# Patient Record
Sex: Female | Born: 1945
Health system: Southern US, Community
[De-identification: ages and names within clinical notes are randomized; demographics above are authoritative.]

## PROBLEM LIST (undated history)

## (undated) DIAGNOSIS — N183 Chronic kidney disease, stage 3 unspecified: Secondary | ICD-10-CM

## (undated) DIAGNOSIS — G459 Transient cerebral ischemic attack, unspecified: Secondary | ICD-10-CM

## (undated) DIAGNOSIS — I2510 Atherosclerotic heart disease of native coronary artery without angina pectoris: Secondary | ICD-10-CM

## (undated) DIAGNOSIS — R42 Dizziness and giddiness: Secondary | ICD-10-CM

## (undated) DIAGNOSIS — G43909 Migraine, unspecified, not intractable, without status migrainosus: Secondary | ICD-10-CM

## (undated) DIAGNOSIS — E119 Type 2 diabetes mellitus without complications: Secondary | ICD-10-CM

## (undated) DIAGNOSIS — D573 Sickle-cell trait: Secondary | ICD-10-CM

## (undated) DIAGNOSIS — I5030 Unspecified diastolic (congestive) heart failure: Secondary | ICD-10-CM

## (undated) DIAGNOSIS — I10 Essential (primary) hypertension: Secondary | ICD-10-CM

## (undated) DIAGNOSIS — M1990 Unspecified osteoarthritis, unspecified site: Secondary | ICD-10-CM

## (undated) HISTORY — PX: JOINT REPLACEMENT: SHX530

## (undated) HISTORY — PX: CARDIAC CATHETERIZATION: SHX172

## (undated) HISTORY — PX: CATARACT EXTRACTION W/ INTRAOCULAR LENS  IMPLANT, BILATERAL: SHX1307

---

## 1970-06-26 HISTORY — PX: TONSILLECTOMY: SUR1361

## 1976-06-26 DIAGNOSIS — G459 Transient cerebral ischemic attack, unspecified: Secondary | ICD-10-CM

## 1976-06-26 HISTORY — DX: Transient cerebral ischemic attack, unspecified: G45.9

## 2000-12-28 ENCOUNTER — Encounter: Payer: Self-pay | Admitting: Emergency Medicine

## 2000-12-28 ENCOUNTER — Inpatient Hospital Stay (HOSPITAL_COMMUNITY): Admission: EM | Admit: 2000-12-28 | Discharge: 2000-12-29 | Payer: Self-pay | Admitting: Emergency Medicine

## 2000-12-31 ENCOUNTER — Emergency Department (HOSPITAL_COMMUNITY): Admission: EM | Admit: 2000-12-31 | Discharge: 2001-01-01 | Payer: Self-pay | Admitting: *Deleted

## 2001-01-01 ENCOUNTER — Encounter: Payer: Self-pay | Admitting: *Deleted

## 2001-01-02 ENCOUNTER — Encounter: Payer: Self-pay | Admitting: *Deleted

## 2001-01-02 ENCOUNTER — Emergency Department (HOSPITAL_COMMUNITY): Admission: EM | Admit: 2001-01-02 | Discharge: 2001-01-03 | Payer: Self-pay | Admitting: Podiatry

## 2001-01-03 ENCOUNTER — Encounter: Payer: Self-pay | Admitting: *Deleted

## 2001-01-04 ENCOUNTER — Other Ambulatory Visit: Admission: RE | Admit: 2001-01-04 | Discharge: 2001-01-04 | Payer: Self-pay | Admitting: Obstetrics and Gynecology

## 2002-04-04 ENCOUNTER — Encounter: Payer: Self-pay | Admitting: Internal Medicine

## 2002-04-04 ENCOUNTER — Emergency Department (HOSPITAL_COMMUNITY): Admission: EM | Admit: 2002-04-04 | Discharge: 2002-04-05 | Payer: Self-pay | Admitting: Internal Medicine

## 2003-04-10 ENCOUNTER — Inpatient Hospital Stay (HOSPITAL_COMMUNITY): Admission: EM | Admit: 2003-04-10 | Discharge: 2003-04-14 | Payer: Self-pay | Admitting: Emergency Medicine

## 2003-10-24 ENCOUNTER — Emergency Department (HOSPITAL_COMMUNITY): Admission: EM | Admit: 2003-10-24 | Discharge: 2003-10-24 | Payer: Self-pay | Admitting: Emergency Medicine

## 2004-11-12 ENCOUNTER — Ambulatory Visit (HOSPITAL_COMMUNITY): Admission: RE | Admit: 2004-11-12 | Discharge: 2004-11-12 | Payer: Self-pay | Admitting: Family Medicine

## 2005-06-29 ENCOUNTER — Ambulatory Visit (HOSPITAL_COMMUNITY): Admission: RE | Admit: 2005-06-29 | Discharge: 2005-06-29 | Payer: Self-pay | Admitting: Internal Medicine

## 2005-08-05 ENCOUNTER — Emergency Department (HOSPITAL_COMMUNITY): Admission: EM | Admit: 2005-08-05 | Discharge: 2005-08-05 | Payer: Self-pay | Admitting: Emergency Medicine

## 2005-08-14 ENCOUNTER — Ambulatory Visit (HOSPITAL_COMMUNITY): Admission: RE | Admit: 2005-08-14 | Discharge: 2005-08-14 | Payer: Self-pay | Admitting: Nephrology

## 2006-03-02 ENCOUNTER — Ambulatory Visit (HOSPITAL_COMMUNITY): Admission: RE | Admit: 2006-03-02 | Discharge: 2006-03-02 | Payer: Self-pay | Admitting: Family Medicine

## 2006-05-03 ENCOUNTER — Inpatient Hospital Stay (HOSPITAL_COMMUNITY): Admission: RE | Admit: 2006-05-03 | Discharge: 2006-05-10 | Payer: Self-pay | Admitting: Orthopedic Surgery

## 2006-05-03 HISTORY — PX: REPLACEMENT TOTAL KNEE: SUR1224

## 2006-06-05 ENCOUNTER — Encounter (HOSPITAL_COMMUNITY): Admission: RE | Admit: 2006-06-05 | Discharge: 2006-06-25 | Payer: Self-pay | Admitting: Orthopedic Surgery

## 2006-06-27 ENCOUNTER — Encounter (HOSPITAL_COMMUNITY): Admission: RE | Admit: 2006-06-27 | Discharge: 2006-07-27 | Payer: Self-pay | Admitting: Orthopedic Surgery

## 2006-07-30 ENCOUNTER — Encounter (HOSPITAL_COMMUNITY): Admission: RE | Admit: 2006-07-30 | Discharge: 2006-08-29 | Payer: Self-pay | Admitting: Orthopedic Surgery

## 2007-04-04 ENCOUNTER — Ambulatory Visit (HOSPITAL_COMMUNITY): Admission: RE | Admit: 2007-04-04 | Discharge: 2007-04-04 | Payer: Self-pay | Admitting: Surgery

## 2007-10-13 ENCOUNTER — Emergency Department (HOSPITAL_COMMUNITY): Admission: EM | Admit: 2007-10-13 | Discharge: 2007-10-13 | Payer: Self-pay | Admitting: Emergency Medicine

## 2007-10-16 ENCOUNTER — Ambulatory Visit (HOSPITAL_COMMUNITY): Admission: RE | Admit: 2007-10-16 | Discharge: 2007-10-16 | Payer: Self-pay | Admitting: Family Medicine

## 2007-11-04 ENCOUNTER — Ambulatory Visit: Payer: Self-pay | Admitting: Gastroenterology

## 2007-11-08 ENCOUNTER — Ambulatory Visit: Payer: Self-pay | Admitting: Gastroenterology

## 2007-11-08 ENCOUNTER — Ambulatory Visit (HOSPITAL_COMMUNITY): Admission: RE | Admit: 2007-11-08 | Discharge: 2007-11-08 | Payer: Self-pay | Admitting: Gastroenterology

## 2007-11-08 HISTORY — PX: ESOPHAGOGASTRODUODENOSCOPY: SHX1529

## 2007-11-13 ENCOUNTER — Encounter (HOSPITAL_COMMUNITY): Admission: RE | Admit: 2007-11-13 | Discharge: 2007-12-13 | Payer: Self-pay | Admitting: Gastroenterology

## 2007-11-13 HISTORY — PX: GASTRIC MOTILITY STUDY: SHX964

## 2008-01-07 ENCOUNTER — Ambulatory Visit: Payer: Self-pay | Admitting: Gastroenterology

## 2008-04-09 ENCOUNTER — Ambulatory Visit (HOSPITAL_COMMUNITY): Admission: RE | Admit: 2008-04-09 | Discharge: 2008-04-09 | Payer: Self-pay | Admitting: Family Medicine

## 2008-10-02 ENCOUNTER — Emergency Department (HOSPITAL_COMMUNITY): Admission: EM | Admit: 2008-10-02 | Discharge: 2008-10-02 | Payer: Self-pay | Admitting: Emergency Medicine

## 2008-10-05 ENCOUNTER — Ambulatory Visit (HOSPITAL_COMMUNITY): Admission: RE | Admit: 2008-10-05 | Discharge: 2008-10-05 | Payer: Self-pay | Admitting: Family Medicine

## 2008-12-29 ENCOUNTER — Encounter: Payer: Self-pay | Admitting: Gastroenterology

## 2009-01-20 ENCOUNTER — Ambulatory Visit (HOSPITAL_COMMUNITY): Admission: RE | Admit: 2009-01-20 | Discharge: 2009-01-20 | Payer: Self-pay | Admitting: Family Medicine

## 2009-07-27 ENCOUNTER — Inpatient Hospital Stay (HOSPITAL_COMMUNITY): Admission: EM | Admit: 2009-07-27 | Discharge: 2009-08-01 | Payer: Self-pay | Admitting: Emergency Medicine

## 2010-01-27 ENCOUNTER — Encounter: Payer: Self-pay | Admitting: Gastroenterology

## 2010-02-11 ENCOUNTER — Encounter (INDEPENDENT_AMBULATORY_CARE_PROVIDER_SITE_OTHER): Payer: Self-pay

## 2010-02-11 DIAGNOSIS — IMO0001 Reserved for inherently not codable concepts without codable children: Secondary | ICD-10-CM | POA: Insufficient documentation

## 2010-02-11 DIAGNOSIS — I10 Essential (primary) hypertension: Secondary | ICD-10-CM | POA: Insufficient documentation

## 2010-02-11 DIAGNOSIS — Z794 Long term (current) use of insulin: Secondary | ICD-10-CM

## 2010-02-11 DIAGNOSIS — E1140 Type 2 diabetes mellitus with diabetic neuropathy, unspecified: Secondary | ICD-10-CM | POA: Insufficient documentation

## 2010-02-11 DIAGNOSIS — R51 Headache: Secondary | ICD-10-CM | POA: Insufficient documentation

## 2010-02-15 ENCOUNTER — Telehealth (INDEPENDENT_AMBULATORY_CARE_PROVIDER_SITE_OTHER): Payer: Self-pay

## 2010-02-15 ENCOUNTER — Ambulatory Visit: Payer: Self-pay | Admitting: Internal Medicine

## 2010-02-15 DIAGNOSIS — R109 Unspecified abdominal pain: Secondary | ICD-10-CM | POA: Insufficient documentation

## 2010-02-15 DIAGNOSIS — E0590 Thyrotoxicosis, unspecified without thyrotoxic crisis or storm: Secondary | ICD-10-CM | POA: Insufficient documentation

## 2010-02-15 DIAGNOSIS — R1310 Dysphagia, unspecified: Secondary | ICD-10-CM | POA: Insufficient documentation

## 2010-02-15 DIAGNOSIS — K219 Gastro-esophageal reflux disease without esophagitis: Secondary | ICD-10-CM | POA: Insufficient documentation

## 2010-02-25 ENCOUNTER — Encounter: Payer: Self-pay | Admitting: Gastroenterology

## 2010-03-04 ENCOUNTER — Encounter: Payer: Self-pay | Admitting: Gastroenterology

## 2010-03-15 LAB — CONVERTED CEMR LAB
ALT: 13 units/L (ref 0–35)
Albumin: 3.9 g/dL (ref 3.5–5.2)
Amylase: 43 units/L (ref 0–105)
Basophils Absolute: 0 10*3/uL (ref 0.0–0.1)
Basophils Relative: 1 % (ref 0–1)
Calcium: 10.1 mg/dL (ref 8.4–10.5)
Chloride: 105 meq/L (ref 96–112)
Creatinine, Ser: 1.43 mg/dL — ABNORMAL HIGH (ref 0.40–1.20)
Eosinophils Relative: 6 % — ABNORMAL HIGH (ref 0–5)
Free T4: 1.1 ng/dL (ref 0.80–1.80)
HCT: 38 % (ref 36.0–46.0)
Lipase: <10 units/L (ref 0–75)
MCHC: 36.1 g/dL — ABNORMAL HIGH (ref 30.0–36.0)
Monocytes Absolute: 0.4 10*3/uL (ref 0.1–1.0)
Neutrophils Relative %: 47 % (ref 43–77)
Platelets: 179 10*3/uL (ref 150–400)
RBC: 4.88 M/uL (ref 3.87–5.11)
Sodium: 140 meq/L (ref 135–145)
TSH: 1.497 microintl units/mL (ref 0.350–4.500)
Total Bilirubin: 0.3 mg/dL (ref 0.3–1.2)
Total Protein: 6.7 g/dL (ref 6.0–8.3)
WBC: 4.9 10*3/uL (ref 4.0–10.5)

## 2010-04-13 ENCOUNTER — Encounter (INDEPENDENT_AMBULATORY_CARE_PROVIDER_SITE_OTHER): Payer: Self-pay

## 2010-04-14 ENCOUNTER — Encounter (INDEPENDENT_AMBULATORY_CARE_PROVIDER_SITE_OTHER): Payer: Self-pay | Admitting: *Deleted

## 2010-07-08 ENCOUNTER — Encounter (INDEPENDENT_AMBULATORY_CARE_PROVIDER_SITE_OTHER): Payer: Self-pay | Admitting: *Deleted

## 2010-07-28 NOTE — Progress Notes (Signed)
Summary: Cassandra Adams, @ [phone] FOR ALL CORRESPONDENCE  Phone Note Outgoing Call   Summary of Call: FYI to call pt's daughter for all correspondence Initial call taken by: Waldon Merl LPN,  August 23, 624THL 4:49 PM

## 2010-07-28 NOTE — Medication Information (Signed)
Summary: OMEPRAZOLE  OMEPRAZOLE   Imported By: Hoy Morn 01/27/2010 11:49:06  _____________________________________________________________________  External Attachment:    Type:   Image     Comment:   External Document  Appended Document: OMEPRAZOLE    Prescriptions: OMEPRAZOLE 20 MG CPDR (OMEPRAZOLE) one by mouth 30 mins before breakfast daily  #30 x 0   Entered and Authorized by:   Laureen Ochs. Bernarda Caffey   Signed by:   Laureen Ochs Bernarda Caffey on 01/27/2010   Method used:   Electronically to        [facility] (retail)       [place] [address]       [place], [place]  [address]       Ph: [phone]       Fax: [phone]   RxID:   [id number]    NEEDS OV PRIOR TO FURTHER RFS  Appended Document: OMEPRAZOLE    Prescriptions: OMEPRAZOLE 20 MG CPDR (OMEPRAZOLE) one by mouth 30 mins before breakfast daily  #30 x 0   Entered and Authorized by:   Laureen Ochs. Bernarda Caffey   Signed by:   Laureen Ochs Bernarda Caffey on 01/27/2010   Method used:   Electronically to        [facility] (retail)       [address]       [place], [place]  [address]       Ph: [phone]       Fax: [phone]   RxID:   [id number]    DISREGARD RX SENT TO [facility]  Appended Document: OMEPRAZOLE called [facility]- cancelled rx  Appended Document: OMEPRAZOLE pt aware of appt for 02/15/10 @  0930 w/LSL

## 2010-07-28 NOTE — Letter (Signed)
Summary: Recall Office Visit  [facility]  [address]   [place], [place] [address]   Phone: [phone]  Fax: [phone]      April 14, 2010   Cassandra Adams [address] [place], [place]  [address] 18-Sep-1945   Dear Ms. Leeth,   According to our records, it is time for you to schedule a follow-up office visit with [place].   At your convenience, please call [phone] to schedule an office visit. If you have any questions, concerns, or feel that this letter is in error, we would appreciate your call.   Sincerely,    [facility] Ph: [phone]   Fax: [phone]

## 2010-07-28 NOTE — Miscellaneous (Signed)
Summary: Orders Update  Clinical Lists Changes  Orders: Added new Test order of T-CBC w/Diff ([id number]) - Signed 

## 2010-07-28 NOTE — Letter (Signed)
Summary: Recall, Labs Needed  [facility]  [address]   [place], [place] [address]   Phone: [phone]  Fax: [phone]    April 13, 2010  Cassandra Adams [place], [place]  [address] Aug 25, 1945   Dear Cassandra Adams,   Our records indicate it is time to repeat your blood work.  You can take the enclosed form to the lab on or near the date indicated.  Please make note of the new location of the lab:   [place], 2nd floor   [place] office will call you within a week to ten business days with the results.  If you do not hear from [place] in 10 business days, you should call the office.  If you have any questions regarding this, call the office at [phone], and ask for the nurse.  Labs are due on 04/29/2010.   Sincerely,    Burnadette Peter LPN  [facility] Ph: [phone]   Fax: [phone]

## 2010-07-28 NOTE — Assessment & Plan Note (Signed)
Summary: FU ON MED REFILLS/SS   Visit Type:  f/u Primary Care Provider:  [facility]  Chief Complaint:  medication refills.  History of Present Illness: Ms. Cardosa is a pleasant 65 y/o AA female, who presents today at our request for f/u to receive further refills on her omeprazole. We last saw her in 7/09. She has h/o n/v and dysphagia. Previously responded to omeprazole but has taken Reglan as needed. She had GES that showed 38% emptying at 1 hour but at two hours normal (77% emptying).   In Febuary, she was hospitalized for glucose 1400. Started having pp n/v again. Had UTI at the time.  C/O abd pain for several months, describes as pulling pain. Takes omeprazole daily. No heartburn. Having problems swallowing meats/vegetables. Has to wash it down. Does good with soft foods. Going on for several months. ?related to thyroid? Never followed up with doctors. Abd pain most days. Unrelated to meals. Has been having BM every other day. No melena, brbpr. Lost weight around 2/11 but gained some back. Still with n/v, three days last week. Denies heartburn.  While hospitalized in 2/11, she had abnormally low TSH, mild anemia, mildly elevated LFTs, and elevated amylase/lipase.  Current Medications (verified): 1)  Omeprazole 20 Mg Cpdr (Omeprazole) .... One By Mouth 30 Mins Before Breakfast Daily 2)  Lantus 27 Units in The Pm 3)  Humalog Sliding Scale At Noon and Pm 4)  Dilitazem180mg  Qd 5)  Norvasc 10 Mg Tabs (Amlodipine Besylate) 6)  Aspirin 81 Mg Tbec (Aspirin) 7)  Furosemide 40 Mg Tabs (Furosemide) 8)  Lisinopril 20 Mg Tabs (Lisinopril) 9)  Novolog Mix 70/30 70-30 % Susp (Insulin Aspart Prot & Aspart) .... Ss Three Times A Day 10)  Pravastatin Sodium 10 Mg Tabs (Pravastatin Sodium) .... Once Daily 11)  Metoclopramide Hcl 5 Mg Tabs (Metoclopramide Hcl) .... Qid As Needed  Allergies (verified): 1)  ! Penicillin  Past History:  Past Surgical History: Last updated: 02/11/2010 Total knee  replacement  Past Medical History: elevated calcium nausea and vomiting-intermittent Diabetes mellitus, type II Headache Hypertension Thryoid? Never had TCS, doesn't want one EGD, 5/09, Dr. Oneida Alar normal  Family History: Mother, deceased, age 29 due to MI Father, deceased, age 51 due to lung cancer Brother, deceased due to cirrhosis FH negative for CRC.  Social History: Separated. Two children. Retired on disability. Nonsmoker. No alcohol. She asked we call her dgt, Afreida at [phone] (w) with any correspondence.  Review of Systems General:  Denies fever, chills, sweats, anorexia, fatigue, weakness, and weight loss. Eyes:  Denies vision loss. ENT:  Complains of difficulty swallowing; denies loss of smell, sore throat, and hoarseness. CV:  Denies chest pains, angina, palpitations, dyspnea on exertion, and peripheral edema. Resp:  Denies dyspnea at rest, dyspnea with exercise, cough, sputum, and wheezing. GI:  See HPI. GU:  Denies urinary burning and blood in urine. MS:  Complains of joint pain / LOM. Derm:  Denies rash and itching. Neuro:  Denies weakness, frequent headaches, memory loss, and confusion. Psych:  Denies depression and anxiety. Endo:  Denies unusual weight change. Heme:  Denies bruising and bleeding. Allergy:  Denies hives and rash.  Vital Signs:  Patient profile:   65 year old female Height:      67 inches Weight:      206 pounds BMI:     32.38 Temp:     98.0 degrees F oral Pulse rate:   68 / minute BP sitting:   130 / 80  (left  arm) Cuff size:   regular  Vitals Entered By: Burnadette Peter LPN (August 23, 624THL 9:24 AM)  Physical Exam  General:  Well developed, well nourished, no acute distress.obese.   Head:  Normocephalic and atraumatic. Eyes:  Conjunctivae pink, no scleral icterus.  Mouth:  Oropharyngeal mucosa moist, pink.  No lesions, erythema or exudate.    Neck:  Supple; no masses or thyromegaly. Lungs:  Clear throughout to  auscultation. Heart:  Regular rate and rhythm; no murmurs, rubs,  or bruits. Abdomen:  Obese. Soft. Mild lower abd tenderness. No rebound or guarding. No HSM or masses. No abd bruit or hernia.  Extremities:  No clubbing, cyanosis, edema or deformities noted. Neurologic:  Alert and  oriented x4;  grossly normal neurologically. Skin:  Intact without significant lesions or rashes. Cervical Nodes:  No significant cervical adenopathy. Psych:  Alert and cooperative. Normal mood and affect.  Impression & Recommendations:  Problem # 1:  ABDOMINAL PAIN, UNSPECIFIED SITE (ICD-789.00)  Nonspecific abd pain. Hospitalization early this year, lfts and amylase/lipase were up. Recommend labs. Increase omeprazole to two times a day. She cannot get meds refilled until next month so we will give Prilosec OTC to supplement dose increase (#20). She may need BPE/UGI vs EGD.   Orders: T-CBC w/Diff [phone]) T-Comprehensive Metabolic Panel ([id number]) T-Amylase [id number]) T-Lipase [phone])  Problem # 2:  GERD (ICD-530.81) see #1.  Problem # 3:  DYSPHAGIA UNSPECIFIED (ICD-787.20) Dysphagia may be secondary to refractory gerd. Normal EGD 2009. May need BPE/UGI vs EGD. Await labs first. Will increase omeprazole to two times a day to see if any affect on symptoms.   Other Orders: T-TSH [id number]) T-Free T4 ([address]) Prescriptions: OMEPRAZOLE 20 MG CPDR (OMEPRAZOLE) one by mouth 30 mins before breakfast and one by mouth 30 mins before evening meal  #60 x 5   Entered and Authorized by:   Laureen Ochs. Bernarda Caffey   Signed by:   Laureen Ochs Bernarda Caffey on 02/15/2010   Method used:   Electronically to        [facility] (retail)       [address]       [place], [place]  [address]       Ph: [phone]       Fax: [phone]   RxID:   [id number]   Appended Document: FU ON MED REFILLS/SS Please call patient's daughter Kaylyn Layer) at [phone] for all  correspondence. Please let her know, I need to get some labs on patient. Please see orders above.  Appended Document: FU ON MED REFILLS/SS Informed pt's daughter. Lab order faxed to [facility].  Appended Document: Orders Update    Clinical Lists Changes  Orders: Added new Service order of Est. Patient Level IV [id number]) - Signed

## 2010-07-28 NOTE — Miscellaneous (Signed)
Summary: procedure notes  Clinical Lists Changes   NAME:  VERONE, PARI          ACCOUNT NO.:  [id number]      MEDICAL RECORD NO.:  [id number]          PATIENT TYPE:  AMB      LOCATION:  DAY                           FACILITY:  [facility]      PHYSICIAN:  Caro Hight, M.D.      DATE OF BIRTH:  1945-12-07      DATE OF PROCEDURE:  11/08/2007   DATE OF DISCHARGE:                                  OPERATIVE REPORT      REFERRING PHYSICIAN:  Bonne Dolores, MD      PROCEDURE:  Esophagogastroduodenoscopy.      INDICATION FOR EXAM:  Ms. Hefter is a 65 year old female who has had   intermittent nausea and vomiting for the past year.  She says the   symptoms began to worse over the last year.  She has a significant past   medical history of diabetes for the last 28 years which had not been   ideally controlled.  She denies heartburn, indigestion or problems   swallowing.  She has had no abdominal pain or hematemesis.      FINDINGS:   1. Normal esophagus without evidence of Barrett, mass, erosion,       ulceration or stricture.   2. Normal stomach, duodenal bulb, and second portion of the duodenum.       Normal ampulla.      DIAGNOSIS:  No source for Ms. Speranza's intermittent nausea and   vomiting identified.  The differential diagnosis includes gastroparesis,   non-ulcer dyspepsia, diabetic enteropathy, or atypical gastroesophageal   reflux disease.      RECOMMENDATIONS:   1. I will schedule gastric emptying study as soon as possible.   2. She should follow a gastroparesis/diabetic diet.  She is given a       handout on what was appropriate for someone who may have delayed       gastric emptying.   3. Follow-up appointment in 2 months with Neil Crouch regarding her       vomiting.  She may need the addition of a PPI.      MEDICATIONS:   1. Demerol 50 mg IV.   2. Versed 4 mg IV.      PROCEDURE TECHNIQUE:  Physical exam was performed.  Informed consent was   obtained from  the patient explaining the benefits, risks and   alternatives to the procedure.  The patient was connected to monitor and   placed in left lateral position.  Continuous oxygen was provided by   nasal cannula and IV medicine administered through an indwelling   cannula.  After administration of sedation, the patient's esophagus was   intubated and a scope was advanced under direct visualization to the   second portion of the duodenum.  The scope was removed slowly by   carefully examining the color, texture, anatomy and integrity of the   mucosa on the way out.  The patient was recovered in endoscopy and   discharged home in satisfactory condition.      ADDENDUM:  GES:  mildly delayed emptying subjectively, but normal   analysis-77% of tracer emptied at 2 hours.               Caro Hight, M.D.   Electronically Signed            SM/MEDQ  D:  11/08/2007  T:  11/09/2007  Job:  [id number]      cc:   Bonne Dolores, M.D.   Fax: [phone]      US Renal. - STATUS: Final  IMAGE                                     Perform Date: 19Feb07 07:54  Ordered By: Lowanda Foster MD , Eli Phillips        Ordered Date: 19Feb07 07:42  Facility: [facility]                               Department: [place]  Service Report Text  [facility] Accession Number: [id number]    History: Chronic renal insufficiency, diabetes, hypertension    BILATERAL RENAL ULTRASOUND:    Kidneys normal in size, measuring 11.2 cm in length right and 10.9 cm   length   left.   Normal renal cortical thickness and echogenicity bilaterally.   No gross evidence of renal mass, hydronephrosis, or shadowing   calcification.   Slightly limited visualization of mid right kidney due to poor   sonographic   window.   Bladder unremarkable.    IMPRESSION:   Slightly limited visualization of right kidney.   Otherwise normal renal ultrasound.    Read By:  Burnetta Sabin,  M.D.   Released By:  Burnetta Sabin,  M.D.  Additional Information  External image :  [id number]   US Abdomen Complete - STATUS: Final  IMAGE                                     Perform Date: 22Apr09 10:10  Ordered By: Caron Presume MD , Ophelia Shoulder          Ordered Date: 22Apr09 09:05  Facility: [facility]                               Department: [place]  Service Report Text  [facility] Accession Number: [id number]      Clinical Data: Headache with nausea vomiting.    ABDOMEN ULTRASOUND    Technique:  Complete abdominal ultrasound examination was performed   including evaluation of the liver, gallbladder, bile ducts,   pancreas, kidneys, spleen, IVC, and abdominal aorta.    Comparison: No comparison abdominal sonogram.  Prior renal sonogram   08/14/2005.    Findings: No gallstones, gallbladder wall thickening or   pericholecystic fluid.  Common bile duct slightly prominent at 6.9   mm.  No intrahepatic bile duct dilatation or focal hepatic lesion.   Pancreas was difficult to evaluate secondary to overlying bowel gas   with pancreatic duct appears slightly prominent.    Mild atherosclerotic type changes abdominal aorta measuring up to   2.5 cm.  Inferior vena cava unremarkable.  Right kidney 11.1 cm and   left kidney 10.4 cm length without evidence hydronephrosis or focal   renal mass. Spleen unremarkable.    IMPRESSION:  No gallstones.    Slightly prominent appearance of the common bile duct and   pancreatic duct.  Evaluation of the pancreas limited secondary to   overlying bowel gas.    Read By:  Doug Sou,  M.D.   Released By:  Doug Sou,  M.D.  Additional Information  HL7 RESULT STATUS : F  External image : [id number]  External IF Update Timestamp : 2007-10-16:12:52:15.000000 NM Gastric Emptying - STATUS: Final  IMAGE                                     Perform Date: 20May09 09:45  Ordered By: Everette Rank,          Ordered Date: 20May09 09:05  Facility: [facility]                               Department: [place]  Service Report Text  [facility] Accession  Number: [id number]      Clinical Data: Pain, diabetes    NUCLEAR MEDICINE GASTRIC EMPTYING STUDY    Technique:  After oral ingestion of radiolabeled meal, sequential   abdominal images were obtained for 120 minutes.  Residual   percentage of activity remaining within the stomach was calculated   at 60 and 120 minutes.    Radiopharmaceutical: 2 mCi Tc-52m sulfur colloid labeled egg whites    Comparison: None    Findings:   Subjectively borderline decreased gastric emptying at 2 hours.   Mild retention of tracer is seen at gastric antrum.   Quantitative analysis however reveals 38% of tracer emptied by 60   minutes and 77% emptied by 120 minutes.   Obtained values represent normal gastric emptying.    IMPRESSION:   Normal gastric emptying exam.    Read By:  Burnetta Sabin,  M.D.   Released By:  Burnetta Sabin,  M.D.  Additional Information  HL7 RESULT STATUS : F  External image : [id number]  External IF Update Timestamp : 2007-11-13:13:17:17.000000  US Renal - STATUS: Final  IMAGE                                     Perform Date: 12Apr10 10:01  Ordered ByCaron Presume MD , MARK Chauncey Cruel          Ordered Date: 73Apr10 09:37  Facility: [facility]                               Department: [place]  Service Report Text  [facility] Accession Number: [id number]      Clinical Data: Abnormal renal function    RENAL/URINARY TRACT ULTRASOUND COMPLETE    Comparison: 10/16/2007    Findings:    Right Kidney:  Sonographically normal measuring 10.9 cm.  Normal   echogenicity.  No cyst, mass, stone or hydronephrosis.    Left Kidney:  Normal at 10.5 cm.    Bladder:  Bladder contains urine.  No abnormalities seen.    IMPRESSION:   Normal appearance of both kidneys.    Read By:  Jules Schick,  M.D.   Released By:  Jules Schick,  M.D.  Additional Information  HL7 RESULT STATUS : F  External image : [id number]  External  IF Update Timestamp : 2008-10-05:10:15:32.000000

## 2010-07-28 NOTE — Letter (Signed)
Summary: Recall Office Visit  [facility]  [address]   [place], [place] [address]   Phone: [phone]  Fax: [phone]      July 08, 2010   Cassandra Adams [place], [place]  [address] 08/03/1945   Dear Cassandra Adams,   According to our records, it is time for you to schedule a follow-up office visit with [place].   At your convenience, please call [phone] to schedule an office visit. If you have any questions, concerns, or feel that this letter is in error, we would appreciate your call.   Sincerely,    [facility] Ph: [phone]   Fax: [phone]

## 2010-08-25 ENCOUNTER — Encounter (HOSPITAL_COMMUNITY): Payer: Self-pay

## 2010-08-25 ENCOUNTER — Emergency Department (HOSPITAL_COMMUNITY): Payer: Medicare Other

## 2010-08-25 ENCOUNTER — Emergency Department (HOSPITAL_COMMUNITY)
Admission: EM | Admit: 2010-08-25 | Discharge: 2010-08-25 | Disposition: A | Discharge status: Home / Self Care | Payer: Medicare Other | Attending: Emergency Medicine | Admitting: Emergency Medicine

## 2010-08-25 DIAGNOSIS — I10 Essential (primary) hypertension: Secondary | ICD-10-CM | POA: Insufficient documentation

## 2010-08-25 DIAGNOSIS — R42 Dizziness and giddiness: Secondary | ICD-10-CM | POA: Insufficient documentation

## 2010-08-25 DIAGNOSIS — Z794 Long term (current) use of insulin: Secondary | ICD-10-CM | POA: Insufficient documentation

## 2010-08-25 DIAGNOSIS — R51 Headache: Secondary | ICD-10-CM | POA: Insufficient documentation

## 2010-08-25 DIAGNOSIS — E119 Type 2 diabetes mellitus without complications: Secondary | ICD-10-CM | POA: Insufficient documentation

## 2010-08-25 LAB — COMPREHENSIVE METABOLIC PANEL
Alkaline Phosphatase: 122 U/L — ABNORMAL HIGH (ref 39–117)
BUN: 17 mg/dL (ref 6–23)
CO2: 25 mEq/L (ref 19–32)
Calcium: 9.8 mg/dL (ref 8.4–10.5)
Glucose, Bld: 162 mg/dL — ABNORMAL HIGH (ref 70–99)
Sodium: 140 mEq/L (ref 135–145)
Total Bilirubin: 0.3 mg/dL (ref 0.3–1.2)
Total Protein: 7.3 g/dL (ref 6.0–8.3)

## 2010-08-25 LAB — CBC
Hemoglobin: 14.7 g/dL (ref 12.0–15.0)
MCH: 27.9 pg (ref 26.0–34.0)
MCHC: 35.3 g/dL (ref 30.0–36.0)
RDW: 13.5 % (ref 11.5–15.5)

## 2010-08-25 LAB — POCT CARDIAC MARKERS
CKMB, poc: 3.7 ng/mL (ref 1.0–8.0)
Myoglobin, poc: 164 ng/mL (ref 12–200)

## 2010-09-14 LAB — GLUCOSE, CAPILLARY
Glucose-Capillary: 102 mg/dL — ABNORMAL HIGH (ref 70–99)
Glucose-Capillary: 144 mg/dL — ABNORMAL HIGH (ref 70–99)
Glucose-Capillary: 149 mg/dL — ABNORMAL HIGH (ref 70–99)
Glucose-Capillary: 168 mg/dL — ABNORMAL HIGH (ref 70–99)
Glucose-Capillary: 177 mg/dL — ABNORMAL HIGH (ref 70–99)
Glucose-Capillary: 181 mg/dL — ABNORMAL HIGH (ref 70–99)
Glucose-Capillary: 185 mg/dL — ABNORMAL HIGH (ref 70–99)
Glucose-Capillary: 202 mg/dL — ABNORMAL HIGH (ref 70–99)
Glucose-Capillary: 255 mg/dL — ABNORMAL HIGH (ref 70–99)
Glucose-Capillary: 314 mg/dL — ABNORMAL HIGH (ref 70–99)
Glucose-Capillary: 349 mg/dL — ABNORMAL HIGH (ref 70–99)
Glucose-Capillary: 37 mg/dL — CL (ref 70–99)
Glucose-Capillary: 434 mg/dL — ABNORMAL HIGH (ref 70–99)
Glucose-Capillary: 476 mg/dL — ABNORMAL HIGH (ref 70–99)
Glucose-Capillary: 80 mg/dL (ref 70–99)
Glucose-Capillary: 97 mg/dL (ref 70–99)
Glucose-Capillary: >600 mg/dL (ref 70–99)
Glucose-Capillary: >600 mg/dL (ref 70–99)
Glucose-Capillary: >600 mg/dL (ref 70–99)

## 2010-09-14 LAB — DIFFERENTIAL
Basophils Absolute: 0 10*3/uL (ref 0.0–0.1)
Basophils Absolute: 0 10*3/uL (ref 0.0–0.1)
Basophils Absolute: 0 10*3/uL (ref 0.0–0.1)
Basophils Relative: 0 % (ref 0–1)
Lymphocytes Relative: 14 % (ref 12–46)
Lymphocytes Relative: 5 % — ABNORMAL LOW (ref 12–46)
Lymphs Abs: 0.5 10*3/uL — ABNORMAL LOW (ref 0.7–4.0)
Lymphs Abs: 1.3 10*3/uL (ref 0.7–4.0)
Monocytes Absolute: 0.4 10*3/uL (ref 0.1–1.0)
Neutro Abs: 2.7 10*3/uL (ref 1.7–7.7)
Neutro Abs: 7.2 10*3/uL (ref 1.7–7.7)
Neutro Abs: 8.2 10*3/uL — ABNORMAL HIGH (ref 1.7–7.7)
Neutrophils Relative %: 56 % (ref 43–77)
Neutrophils Relative %: 78 % — ABNORMAL HIGH (ref 43–77)
Neutrophils Relative %: 90 % — ABNORMAL HIGH (ref 43–77)

## 2010-09-14 LAB — URINE MICROSCOPIC-ADD ON

## 2010-09-14 LAB — BASIC METABOLIC PANEL
BUN: 16 mg/dL (ref 6–23)
BUN: 40 mg/dL — ABNORMAL HIGH (ref 6–23)
BUN: 45 mg/dL — ABNORMAL HIGH (ref 6–23)
BUN: 57 mg/dL — ABNORMAL HIGH (ref 6–23)
BUN: 7 mg/dL (ref 6–23)
BUN: 8 mg/dL (ref 6–23)
CO2: 18 mEq/L — ABNORMAL LOW (ref 19–32)
CO2: 25 mEq/L (ref 19–32)
CO2: 26 mEq/L (ref 19–32)
CO2: 27 mEq/L (ref 19–32)
Calcium: 8.5 mg/dL (ref 8.4–10.5)
Calcium: 8.7 mg/dL (ref 8.4–10.5)
Calcium: 8.9 mg/dL (ref 8.4–10.5)
Calcium: 9 mg/dL (ref 8.4–10.5)
Calcium: 9.2 mg/dL (ref 8.4–10.5)
Calcium: 9.6 mg/dL (ref 8.4–10.5)
Chloride: 109 mEq/L (ref 96–112)
Chloride: 116 mEq/L — ABNORMAL HIGH (ref 96–112)
Chloride: 122 mEq/L — ABNORMAL HIGH (ref 96–112)
Creatinine, Ser: 1.01 mg/dL (ref 0.4–1.2)
Creatinine, Ser: 1.11 mg/dL (ref 0.4–1.2)
Creatinine, Ser: 1.33 mg/dL — ABNORMAL HIGH (ref 0.4–1.2)
Creatinine, Ser: 1.97 mg/dL — ABNORMAL HIGH (ref 0.4–1.2)
Creatinine, Ser: 2.82 mg/dL — ABNORMAL HIGH (ref 0.4–1.2)
GFR calc Af Amer: 14 mL/min — ABNORMAL LOW (ref >60)
GFR calc Af Amer: 15 mL/min — ABNORMAL LOW (ref >60)
GFR calc Af Amer: 31 mL/min — ABNORMAL LOW (ref >60)
GFR calc Af Amer: 60 mL/min — ABNORMAL LOW (ref >60)
GFR calc non Af Amer: 11 mL/min — ABNORMAL LOW (ref >60)
GFR calc non Af Amer: 12 mL/min — ABNORMAL LOW (ref >60)
GFR calc non Af Amer: 13 mL/min — ABNORMAL LOW (ref >60)
GFR calc non Af Amer: 26 mL/min — ABNORMAL LOW (ref >60)
GFR calc non Af Amer: 40 mL/min — ABNORMAL LOW (ref >60)
GFR calc non Af Amer: 49 mL/min — ABNORMAL LOW (ref >60)
GFR calc non Af Amer: 55 mL/min — ABNORMAL LOW (ref >60)
Glucose, Bld: 1390 mg/dL (ref 70–99)
Glucose, Bld: 158 mg/dL — ABNORMAL HIGH (ref 70–99)
Glucose, Bld: 199 mg/dL — ABNORMAL HIGH (ref 70–99)
Glucose, Bld: 205 mg/dL — ABNORMAL HIGH (ref 70–99)
Glucose, Bld: 385 mg/dL — ABNORMAL HIGH (ref 70–99)
Glucose, Bld: 563 mg/dL (ref 70–99)
Glucose, Bld: 72 mg/dL (ref 70–99)
Potassium: 3 mEq/L — ABNORMAL LOW (ref 3.5–5.1)
Potassium: 3.2 mEq/L — ABNORMAL LOW (ref 3.5–5.1)
Potassium: 3.5 mEq/L (ref 3.5–5.1)
Potassium: 4.1 mEq/L (ref 3.5–5.1)
Potassium: 4.3 mEq/L (ref 3.5–5.1)
Potassium: 7.2 mEq/L (ref 3.5–5.1)
Sodium: 141 mEq/L (ref 135–145)
Sodium: 145 mEq/L (ref 135–145)
Sodium: 150 mEq/L — ABNORMAL HIGH (ref 135–145)
Sodium: 154 mEq/L — ABNORMAL HIGH (ref 135–145)

## 2010-09-14 LAB — LIPID PANEL
Cholesterol: 191 mg/dL (ref 0–200)
HDL: 53 mg/dL (ref >39)
LDL Cholesterol: 101 mg/dL — ABNORMAL HIGH (ref 0–99)
Total CHOL/HDL Ratio: 3.6 RATIO

## 2010-09-14 LAB — CARDIAC PANEL(CRET KIN+CKTOT+MB+TROPI)
CK, MB: 3.5 ng/mL (ref 0.3–4.0)
Relative Index: 1.9 (ref 0.0–2.5)
Relative Index: 2.1 (ref 0.0–2.5)
Relative Index: 3 — ABNORMAL HIGH (ref 0.0–2.5)
Total CK: 116 U/L (ref 7–177)
Troponin I: 0.05 ng/mL (ref 0.00–0.06)
Troponin I: 0.06 ng/mL (ref 0.00–0.06)

## 2010-09-14 LAB — HEPATIC FUNCTION PANEL
ALT: 22 U/L (ref 0–35)
Alkaline Phosphatase: 129 U/L — ABNORMAL HIGH (ref 39–117)
Bilirubin, Direct: 0.1 mg/dL (ref 0.0–0.3)
Indirect Bilirubin: 0.3 mg/dL (ref 0.3–0.9)

## 2010-09-14 LAB — CBC
HCT: 39.3 % (ref 36.0–46.0)
HCT: 45.8 % (ref 36.0–46.0)
MCHC: 34.5 g/dL (ref 30.0–36.0)
Platelets: 117 10*3/uL — ABNORMAL LOW (ref 150–400)
Platelets: 195 10*3/uL (ref 150–400)
Platelets: 79 10*3/uL — ABNORMAL LOW (ref 150–400)
RDW: 14 % (ref 11.5–15.5)
RDW: 14.3 % (ref 11.5–15.5)
RDW: 14.6 % (ref 11.5–15.5)
WBC: 9.1 10*3/uL (ref 4.0–10.5)
WBC: 9.3 10*3/uL (ref 4.0–10.5)

## 2010-09-14 LAB — URINALYSIS, ROUTINE W REFLEX MICROSCOPIC
Glucose, UA: >1000 mg/dL — AB
Leukocytes, UA: NEGATIVE
Nitrite: NEGATIVE
Specific Gravity, Urine: <1.005 — ABNORMAL LOW (ref 1.005–1.030)
pH: 5 (ref 5.0–8.0)

## 2010-09-14 LAB — TSH: TSH: 0.077 u[IU]/mL — ABNORMAL LOW (ref 0.350–4.500)

## 2010-09-14 LAB — RAPID URINE DRUG SCREEN, HOSP PERFORMED
Barbiturates: NOT DETECTED
Benzodiazepines: NOT DETECTED
Opiates: NOT DETECTED

## 2010-09-14 LAB — URINE CULTURE: Colony Count: >=100000

## 2010-09-14 LAB — T4, FREE: Free T4: 0.81 ng/dL (ref 0.80–1.80)

## 2010-09-14 LAB — T3, FREE: T3, Free: 1.7 pg/mL — ABNORMAL LOW (ref 2.3–4.2)

## 2010-09-14 LAB — BRAIN NATRIURETIC PEPTIDE: Pro B Natriuretic peptide (BNP): 53.2 pg/mL (ref 0.0–100.0)

## 2010-09-14 LAB — AMYLASE: Amylase: 55 U/L (ref 0–105)

## 2010-09-14 LAB — PHOSPHORUS: Phosphorus: 1.6 mg/dL — ABNORMAL LOW (ref 2.3–4.6)

## 2010-09-14 LAB — LIPASE, BLOOD: Lipase: 24 U/L (ref 11–59)

## 2010-10-05 LAB — URINE MICROSCOPIC-ADD ON

## 2010-10-05 LAB — URINALYSIS, ROUTINE W REFLEX MICROSCOPIC
Glucose, UA: >1000 mg/dL — AB
Hgb urine dipstick: NEGATIVE
Ketones, ur: NEGATIVE mg/dL
pH: 6 (ref 5.0–8.0)

## 2010-10-05 LAB — DIFFERENTIAL
Lymphocytes Relative: 19 % (ref 12–46)
Lymphs Abs: 1.5 10*3/uL (ref 0.7–4.0)
Neutrophils Relative %: 72 % (ref 43–77)

## 2010-10-05 LAB — CBC
Platelets: 190 10*3/uL (ref 150–400)
WBC: 8 10*3/uL (ref 4.0–10.5)

## 2010-10-05 LAB — POCT CARDIAC MARKERS
Myoglobin, poc: 192 ng/mL (ref 12–200)
Troponin i, poc: <0.05 ng/mL (ref 0.00–0.09)

## 2010-10-05 LAB — BASIC METABOLIC PANEL
BUN: 21 mg/dL (ref 6–23)
Creatinine, Ser: 1.27 mg/dL — ABNORMAL HIGH (ref 0.4–1.2)
GFR calc non Af Amer: 43 mL/min — ABNORMAL LOW (ref >60)
Potassium: 4.1 mEq/L (ref 3.5–5.1)

## 2010-11-08 NOTE — Op Note (Signed)
NAMEJHAYLA, MERLINI NO.:  [id number]   MEDICAL RECORD NO.:  [id number]          PATIENT TYPE:  AMB   LOCATION:  DAY                           FACILITY:  [facility]   PHYSICIAN:  Caro Hight, M.D.      DATE OF BIRTH:  March 11, 1946   DATE OF PROCEDURE:  11/08/2007  DATE OF DISCHARGE:                               OPERATIVE REPORT   REFERRING PHYSICIAN:  Bonne Dolores, MD   PROCEDURE:  Esophagogastroduodenoscopy.   INDICATION FOR EXAM:  Ms. Sonnek is a 65 year old female who has had  intermittent nausea and vomiting for the past year.  She says the  symptoms began to worse over the last year.  She has a significant past  medical history of diabetes for the last 28 years which had not been  ideally controlled.  She denies heartburn, indigestion or problems  swallowing.  She has had no abdominal pain or hematemesis.   FINDINGS:  1. Normal esophagus without evidence of Barrett, mass, erosion,      ulceration or stricture.  2. Normal stomach, duodenal bulb, and second portion of the duodenum.      Normal ampulla.   DIAGNOSIS:  No source for Ms. Copes's intermittent nausea and  vomiting identified.  The differential diagnosis includes gastroparesis,  non-ulcer dyspepsia, diabetic enteropathy, or atypical gastroesophageal  reflux disease.   RECOMMENDATIONS:  1. I will schedule gastric emptying study as soon as possible.  2. She should follow a gastroparesis/diabetic diet.  She is given a      handout on what was appropriate for someone who may have delayed      gastric emptying.  3. Follow-up appointment in 2 months with Neil Crouch regarding her      vomiting.  She may need the addition of a PPI.   MEDICATIONS:  1. Demerol 50 mg IV.  2. Versed 4 mg IV.   PROCEDURE TECHNIQUE:  Physical exam was performed.  Informed consent was  obtained from the patient explaining the benefits, risks and  alternatives to the procedure.  The patient was connected to monitor  and  placed in left lateral position.  Continuous oxygen was provided by  nasal cannula and IV medicine administered through an indwelling  cannula.  After administration of sedation, the patient's esophagus was  intubated and a scope was advanced under direct visualization to the  second portion of the duodenum.  The scope was removed slowly by  carefully examining the color, texture, anatomy and integrity of the  mucosa on the way out.  The patient was recovered in endoscopy and  discharged home in satisfactory condition.   ADDENDUM:  GES: mildly delayed emptying subjectively, but normal  analysis-77% of tracer emptied at 2 hours.      Caro Hight, M.D.  Electronically Signed     SM/MEDQ  D:  11/08/2007  T:  11/09/2007  Job:  [id number]   cc:   Bonne Dolores, M.D.  Fax: [phone]

## 2010-11-08 NOTE — Consult Note (Signed)
NAME:  Cassandra Adams, Cassandra Adams          ACCOUNT NO.:  [id number]   MEDICAL RECORD NO.:  [id number]          PATIENT TYPE:  AMB   LOCATION:  DAY                           FACILITY:  [facility]   PHYSICIAN:  Caro Hight, M.D.      DATE OF BIRTH:  18-Apr-1946   DATE OF CONSULTATION:  11/04/2007  DATE OF DISCHARGE:                                 CONSULTATION   REASON FOR CONSULTATION:  Intermittent nausea and vomiting, prominent  common bile duct.   PHYSICIAN REQUESTING CONSULTATION:  Bonne Dolores, MD.   PHYSICIAN CONSENTING NOTE:  Caro Hight, MD   HISTORY OF PRESENT ILLNESS:  Cassandra Adams is a pleasant 65 year old  African-American female who presents today for further evaluation of  intermittent nausea and vomiting.  She states these symptoms have been  going on for several months.  She may have nausea and vomiting off and  on for several days and then go several days without any symptoms.  She  usually vomits within a couple hours after meals consisting partially  digested food.  Occasionally, she has vomited first day in the morning  has vomited food from the day before.  She denies any heartburn.  She  says she has trouble swallowing bread but wonders if this is because she  is eating too fast.  Other foods and liquids go down fine.  She has  chronic constipation.  She has been taking Activia which has improved  her bowel movement frequency to every 2 days.  She denies any rectal  bleeding or melena.  She never had EGD or colonoscopy.  She denies any  hematemesis.  Workup thus far has included an abdominal ultrasound which  revealed a slightly prominent appearance of the common bile duct and  pancreatic duct.  Evaluation of the pancreas was limited due to  overlying bowel gas.  She had mild atherosclerotic type changes of the  abdominal aorta.  There were no gallstones or gallbladder wall  thickening.  No intrahepatic bile duct dilatation.  Labs included a  normal set of LFTs.  Her BUN  is 20, creatinine 1.29.  Creatinine was  also 1.29 back in November 2008.  Her white count was 3200, glucose 202,  sodium 147, hemoglobin 13.5, platelets 172,000.  Her parathyroid hormone  was 70.4, normal.  Her calcium is slightly high at 10.8.   CURRENT MEDICATIONS:  1. Lantus 20 units in the morning.  2. Humalog sliding scale at noon and in the p.m.  3. Actos 15 mg daily.  4. Diltiazem 120 mg daily.  5. Amlodipine 10 mg daily.  6. Aspirin 81 mg 2 daily.  7. Lasix 40 mg daily.  8. Lisinopril 20 mg daily.  9. Meclizine 25 mg p.r.n.  10.Allegra 180 mg daily.   ALLERGIES:  Penicillin.   PAST MEDICAL HISTORY:  1. She has had diabetes mellitus for over 28 years.  2. Hypertension.  3. Headaches.  4. She has elevated calcium level.  5. Right knee replacement in 2007.   FAMILY HISTORY:  Mother deceased at age 70 due to MI.  Father deceased  at age 51 due to  lung cancer.  Brother died due to cirrhosis.  Negative  for colorectal cancer.   SOCIAL HISTORY:  She is separated.  Her daughter is here with her today.  She is retired on disability.  She is a nonsmoker.  No alcohol use.   REVIEW OF SYSTEMS:  See HPI for GI.  CARDIOPULMONARY:  No chest pain,  shortness of breath, or cough.  GENITOURINARY:  No dysuria or hematuria.  CONSTITUTIONAL:  No weight loss.   PHYSICAL EXAMINATION:  Weight 232.5, height 5 feet 7 inches, temperature  98.2, blood pressure 138/60, pulse 60.  GENERAL:  Pleasant, obese black female in no acute distress.  SKIN:  Warm and dry.  No jaundice.  HEENT:  Sclerae nonicteric.  Oropharyngeal mucosa moist and pink.  No  lesions, erythema, or exudate.  No lymphadenopathy or thyromegaly.  CHEST:  Lungs are clear to auscultation.  CARDIAC:  Reveals regular rate and rhythm.  Normal S1-S2.  No murmurs,  rubs, or gallops.  ABDOMEN:  Positive bowel sounds.  Abdomen is obese, soft, nontender,  nondistended.  No organomegaly or masses.  No rebound or guarding.  No   abdominal bruits or hernias.  LOWER EXTREMITIES:  No edema.   IMPRESSION:  Cassandra Adams is a 65 year old African-American female who  presents with a several month history of intermittent nausea and  vomiting.  She also describes dysphagia to breads.  On abdominal  ultrasound, she had a slightly prominent bile duct and pancreatic duct.  Her LFTs are normal.  I suspect this is unremarkable.  The differential  diagnosis for the intermittent nausea and vomiting include peptic ulcer  disease, gastroesophageal reflux disease, and gastroparesis, less likely  biliary.  I recommend ruling out obstructive abnormality of the upper  gastrointestinal tract.  If this is negative, she will need to have  gastric emptying study.  Regarding her hypercalcemia, this is being  followed by Dr. Bonne Dolores.  The patient really was unable to provide  any significant details about prior workup or outcomes.   The patient has never had a screening colonoscopy.  We discussed this at  length today.  She refuses screening colonoscopy at this time stating  that she just does not want to go through with it.   PLAN:  1. EGD with possible esophageal dilatation by Dr. Caro Hight.  2. Management of hypercalcemia as per Dr. Bonne Dolores.  3. We will discuss prominent common bile duct and pancreatic duct with      Dr. Caro Hight.  4. Further recommendations to follow.   I would like to thank Dr. Bonne Dolores for allowing [place] to take part in  the care of this patient.      Neil Crouch, P.A.      Caro Hight, M.D.  Electronically Signed    LL/MEDQ  D:  11/04/2007  T:  11/04/2007  Job:  [id number]   cc:   Bonne Dolores, M.D.  Fax: [phone]   Caro Hight, M.D.  [address]  [place] , [place] [address]

## 2010-11-08 NOTE — Assessment & Plan Note (Signed)
NAME:  CATRICE, BERRIDGE           CHART#:  [id number]   DATE:  01/07/2008                       DOB:  [birth year]   CHIEF COMPLAINT:  Follow up of procedure, and history of nausea and  vomiting.   SUBJECTIVE:  Ms. Roda is a very pleasant 65 year old lady, she  initially presented in May 2009, with complaints of intermittent nausea  and vomiting and had been noted to have a prominent common bile duct on  ultrasound.  Her LFTs were normal.  She underwent an EGD with Dr. Stann Mainland  on Nov 08, 2007.  Study was normal.  She did have gastric emptying  study, which was also normal, noting a 77% and activity has gone to 120  minutes.  At that point in time, omeprazole 20 mg daily was added.  She  presents today stating that she is doing well.  She has had no further  nausea and vomiting.  Her appetite is slowly improving, but she does  note that she is also not hungry.  She admits to eating a large meal for  breakfast and does not have an appetite for the remainder the day.  She  is denies any pain.  Her bowel movements are regular.  Denies any blood  in stools.  No problems swallowing.   CURRENT MEDICATIONS:  1. Lantus 20 units in the morning.  2. Humalog sliding scale at noon and p.m.  3. Actos 15 mg daily.  4. Diltiazem 120 mg daily.  5. Norvasc 10 mg daily.  6. Aspirin 81 mg 2 times daily.  7. Lasix 40 mg daily.  8. Lisinopril 20 mg daily.  9. Meclizine 25 mg p.r.n.  10.Allegra 180 mg daily.  11.Omeprazole 20 mg daily.   ALLERGIES:  PENICILLIN.   PHYSICAL EXAMINATION:  VITAL SIGNS:  Weight 225, which is down from 232  pounds in Nov 03, 2007.  Blood pressure 130/7 and pulse 64.  GENERAL:  Pleasant well-nourished, well-developed black female, in no  acute distress.SKIN:  Warm and dry.  No jaundice.  HEENT:  Sclerae nonicteric.  Oropharyngeal mucosa moist and  pink.ABDOMEN:  Positive bowel sounds, soft, nontender, and nondistended.  No organomegaly or masses.  No rebound or  guarding.  No abdominal bruits  or hernias. LOWER EXTREMITIES:  No edema.   IMPRESSION:  Ms. Rapoport is a very pleasant 64 year old all lady with  history of intermittent nausea, vomiting, and dysphagia to bread.  She  had a slightly prominent bile duct and pancreatic duct on the abdominal  ultrasound.  Her LFTs were normal.  Dr. Stann Mainland felt that this was a  clinically insignificant finding (CBD 6.9 mm).  Her EGD was normal as  well.  She had a normal gastric emptying study.  The patient's symptoms  have settled down on omeprazole 20 mg daily.  Therefore, she may have  some mild reflux.  Her dysphagia is now resolved.  Somewhat concerning  is an unintentional weight loss of 7 pounds over the course of last 2-3  months.  I have asked her to monitor this at home and let us know if  weight loss persist.   PLAN:  1. She will monitor her weight at home for the next 2 months and let      [place] know if her weight loss persist.  2. Continue omeprazole 20 mg daily.  3. OPV in 4 months and will recheck HFP.       Neil Crouch, P.A.  Electronically Signed     Caro Hight, M.D.  Electronically Signed    LL/MEDQ  D:  01/07/2008  T:  01/08/2008  Job:  [id number]   cc:   Bonne Dolores, M.D.

## 2010-11-11 NOTE — Procedures (Signed)
   NAME:  SHELAH, KRUT NO.:  [id number]   MEDICAL RECORD NO.:  [id number]                   PATIENT TYPE:  EMS   LOCATION:  ED                                   FACILITY:  [facility]   PHYSICIAN:  Edward L. Luan Pulling, M.D.             DATE OF BIRTH:  Oct 12, 1945   DATE OF PROCEDURE:  04/04/2002  DATE OF DISCHARGE:  04/05/2002                                EKG INTERPRETATION   DATE AND TIME OF TEST:  April 04, 2002 at 2216.   IMPRESSION:  The rhythm is sinus rhythm with a rate in the 70s.  There is  probable left atrial enlargement.  There are T wave abnormalities inferiorly  and laterally which could be due to ischemia, and clinical correlation is  suggested.  Abnormal electrocardiogram.                                               Jasper Loser. Luan Pulling, M.D.    ELH/MEDQ  D:  04/05/2002  T:  04/07/2002  Job:  [id number]

## 2010-11-11 NOTE — Discharge Summary (Signed)
[name], [name]:  [id number]   MEDICAL RECORD NO.:  [id number]          PATIENT TYPE:  INP   LOCATION:  5019                         FACILITY:  [facility]   PHYSICIAN:  Rodney A. Mortenson, M.D.DATE OF BIRTH:  1946/04/25   DATE OF ADMISSION:  05/03/2006  DATE OF DISCHARGE:  05/10/2006                               DISCHARGE SUMMARY   ADMITTING DIAGNOSES:  1. Osteoarthritis right knee.  2. Insulin-dependent diabetes mellitus.  3. Hypertension.   DISCHARGE DIAGNOSES:  1. Status post right total knee arthroplasty.  2. Pyrexia, unknown etiology, resolved.  3. Uncontrolled diabetes mellitus, insulin-dependent.  4. Recurrent diabetic ketoacidosis secondary to noncompliance.  5. Acute blood loss mainly secondary to surgery.   HISTORY OF PRESENT ILLNESS:  The patient is a 65 year old African  American female status post knee arthroscopy in 1994 by Dr. Luna Glasgow.  The patient has had intermittent problems in the right knee since knee  scope.  However, on May 05, 2006, she fell at [facility] and has had  persistent constant severe right knee pain since.  The patient uses a  cane or crutch to ambulate.  The patient has failed conservative  treatment.  MRI revealed end-stage degenerative joint disease.  The  patient is admitted for right total knee arthroplasty.   ALLERGIES:  PENICILLIN CAUSES SWELLING.   MEDICATIONS:  1. Hyzaar 100/25 one q.a.m.  2. Plavix 75 mg one daily.  3. __________ 120 mg one daily.  4. Amlodipine/Benazepril 10/20 one daily.  5. Humalog 100 units/mL three times a day, sliding scale.  6. Lantus 25 units once q.a.m.   SURGICAL PROCEDURE:  The patient was taken to the operating room on  November 8, 2064m by Dr. Gildardo Cranker, assisted by Benita Stabile, PA-C.  The patient was placed under general anesthesia and then a right total  knee arthroplasty was performed.  The following components were used:  A  large right cemented femoral  component with a size 4 cemented MBT keeled  tibial tray with a 10-mm poly bearing insert with a metal-back patella  inserted, size large.  The patient tolerated the procedure well and  returned to recovery in good stable condition.   CONSULTS:  The following consults were obtained while the patient  hospitalized:  [facility] hospitalist, pharmacy, PT/OT, case management.   HOSPITAL COURSE:  Postop day one, the patient's T-max 100.6, vital signs  otherwise stable.  CBGs were 289.  Hemoglobin A1c 8.1%.  H and H was  7.6, 33.7.  White count was 9100.  The patient denied any chest pain,  shortness of breath, calf pain, nausea or vomiting.  No symptoms of  anemia.   Postop day two, the patient's T-max 101.2.  White count 13,600.  CBGs  greater than 400.  Otherwise, vital signs stable.  The patient denied  any chest pain, shortness of breath, nausea, vomiting, or calf pain.  [facility] hospitalist was called due to the patient's elevated CBGs.  Medicine adjusted the patient's diabetes mellitus meds raising her units  to 30 units subcu daily, meal coverage with 40 units of NovoLog sliding  scale with resistant scale.  In  regard to her pyrexia blood cultures  were ordered, chest x-ray, UA.   Postop day three, patient's T-max 100.9; vitals otherwise stable.  White  count remained 13,900.  Hemoglobin and hematocrit were 9.7 and 27.1.  CBGs 214 to 487.  Lantus was increased.  Blood cultures showed gram-  positive cocci in clusters.  The patient was begun on vancomycin.  A  portable chest x-ray on May 05, 2006, showed lungs underinflated  with mild bibasilar atelectasis.  UA came back with E. coli and 20,000  colonies.  The patient was placed on Cipro by medicine.  Postop day  four, patient afebrile, vital signs stable.  No new labs.  The patient  progressing well with physical therapy.  No chest pain.  No shortness of  breath.  No nausea or vomiting.   Postop day five, the patient's H and  H of 8.3 and 25, white count was  7800.  Patient still with uncontrolled diabetes, medicine adjusted  diabetes medications.  All antibiotics were stopped due to the fact  remaining blood cultures were negative.  Initial blood culture was felt  to be positive due to contaminant.  UA was also felt to be a  contaminant.   Postop day six, patient afebrile for greater than 24 hours, vital signs  stable.  CBGs remained uncontrolled.  At 5 minutes after midnight was  418.  Patient with no symptoms of anemia.  Waiting final evaluation by  medicine for orally controlled sugars and thoughts on antibiotic  coverage.  The patient was later evaluated by medicine; diabetes  medications were reevaluated.  Lantus was increased to 50 units subcu  daily.  Again the coag staph and E. coli were felt to be contaminate.  No further antibiotics were needed, as the patient had been afebrile and  asymptomatic.  Patient is ready for discharge from a medical standpoint.   Postop day seven, the patient reported unable to leave yesterday per  chart.  CBGs ranged 250 to 300.  Lantus was changed to 60 units subcu  daily, Actos 15 mg p.o. daily.  Glipizide was discontinued.  The patient  was discharged to home later that day after medical equipment needs were  met.   LABS:  Routine labs on admission: CBC all values within normal limits.  Coag values within normal limits on admission.  Routine chemistries on  admission - sodium 140, potassium 4.8, chloride 109, bicarb 22 and  glucose 91, BUN was elevated at 27, creatinine 1.4, also elevated.  Calcium was 10.8.  Hepatic enzymes, all values within normal limits.  Hemoglobin A1c on admission was 8.1.  Urinalysis on admission was  negative.  Blood cultures dated May 05, 2006, showed no growth  initially.  Blood culture at 1815 on May 05, 2006, showed  staphylococcus coag negative to be present.  Remaining blood cultures May 05, 2006, through May 08, 2006, were negative.  No growth  after 5 days.  Urine culture from November __________ showed no growth.  Urine culture from May 06, 2006, showed 20,000 colonies/mL of E.  coli.   DISCHARGE INSTRUCTIONS:  1. Diet was no restrictions.  2. Activity - patient is partial weightbearing at 50%, left leg with      walker.  3. Wound care - the patient is to keep the wound clean and dry, change      dressing daily.  4. Call the office if any signs of infections.   MEDICATIONS:  1. May resume preop meds  except for Plavix while on the __________ 2.5      subcu injection at 8 a.m. daily, last dose May 10, 2006.      Resume Plavix on May 11, 2006.  2. Percocet 5/325 one to two tablets every 4-6 hours for pain.  3. Lantus 60 units subcu daily, changed from previous dose.  4. Patient to continue sliding scale with Humalog as before.  Actos 15      mg daily was added; change her diabetes.   FOLLOWUP:  1. The patient is to follow up with Dr. Alphonzo Cruise two weeks postop.      The patient is to call the office at [phone] for an appointment.  2. Home-health PT per Arville Go.   SPECIAL INSTRUCTIONS:  CPM 0 to 90 degrees, then increase by 10 degrees  daily.   The patient will follow up with primary care physician in two days due  to uncontrolled diabetes mellitus.      Erskine Emery, P.A.    ______________________________  Geroge Baseman Alphonzo Cruise, M.D.    GC/MEDQ  D:  07/18/2006  T:  07/18/2006  Job:  [id number]

## 2010-11-11 NOTE — Op Note (Signed)
Cassandra, [name]:  [id number]   MEDICAL RECORD NO.:  [id number]          PATIENT TYPE:  INP   LOCATION:  5019                         FACILITY:  [facility]   PHYSICIAN:  Rodney A. Mortenson, M.D.DATE OF BIRTH:  [birth year]   DATE OF PROCEDURE:  05/03/2006  DATE OF DISCHARGE:                                 OPERATIVE REPORT   PREOPERATIVE DIAGNOSIS:  Severe osteoarthritis, right knee.   POSTOPERATIVE DIAGNOSIS:  Severe osteoarthritis, right knee.   OPERATION:  Right total knee using the large right cemented femoral  component with a size 4 cemented MBT Keeled tibial tray with a 10-mm poly  bearing insert and a metal-backed cemented patella, large size.   ANESTHESIA:  General.   SURGEON:  Rodney A. Alphonzo Cruise, MD   ASSISTANTCarlis Abbott.   PROCEDURE:  The patient placed on the operating table in the supine position  with the pneumatic tourniquet about the right upper thigh.  The entire right  lower extremity was prepped with DuraPrep and draped down in the usual  manner.  Vi-Drapes enclosed the operative site.  An Esmarch was then used to  wrap off the leg, and tourniquet was elevated.  A straight incision made  starting above the patella and carried down to the tibial tubercle.  The  patella was then everted laterally.  A long medial parapatellar incision  made, the patella was everted.  Excellent access to the joint was achieved.  The patellar fat pad was removed.  Both the medial and lateral meniscus and  the cruciate ligaments were excised off the posterior aspect of the upper  part of the distal femur under direct vision.  Schanz pins were placed in  the distal femur and proximal tibia in a standard manner, and the tibial and  femoral arrays were applied.  Registration was then started.  The femoral  head centered, the tibial mechanical axis, the definition of the proximal  and tibial plateau, and definition of the distal femoral condyles and upper  condyles were done following all the computer promptings in the appropriate  sequence.  Tibial planning was then done.  Tibial resection settings were  registered.  Tibial resection cut #1 was placed over the proximal tibia and  following the computer promptings, this was placed at the appropriate level  and once this was in satisfactory position, it was pinned with locking pins.  The capture guide was applied, and the proximal tibia was resected.  Small  cleanup cups were made and the confirmation guide was used to confirm the  cut.  I was very pleased with the initial cut.  Attention was then turned to  soft tissue balancing, both flexion and extension, using the gap balancing  guides.  Following computer prompting, femoral implant planning was then  registered.  Femoral resection was then done following all the computer  promptings.  The distal femur was resected first, the anterior femur  resection was done after this was completed, and then the distal chamfer cut  was made.  This was followed throughout using all the computer promptings.  The spacer blocks were used to confirm soft  tissue balancing with each  sequential step, and there was excellent balancing of soft tissue both in  flexion and extension.  At this point, the distal femoral trial was done.  The tibia was subluxed anteriorly using a __________ .  A size 4 trial was  seen to fit very nicely.  This was pinned in place.  The tower was applied  and the drill hole was placed in the proximal tibia.  The wing cutting guide  was then implanted into the proximal tibia.  A 10-mm trial was placed on the  tibia trial, and the femoral trial component was placed over the distal end  of the femur, and the knee was articulated.  Knee was put through a full  range of motion.  This was extremely stable to full flexion, full extension,  in fact, slight hyperextension.  There was absolutely no AP drawer.  No  spitting of the poly.  I was  very pleased with the balancing in flexion and  extension.  At this point, the patella cutting guide was applied and the  posterior aspect of the patella removed.  Drill holes were placed in the  posterior aspect of the patella using the drill guide.  The patella trial  was put in place and knee put through full range of motion.  A lateral  release was done and allowed the patella to track nicely in the midline  without tilting.  The balancing was absolutely wonderful at this point, and  I was very pleased with progress.  All of the components were removed.  A  pulsing lavage was used.  All debris was removed.  At this point, glue had  been mixed and final components placed on the back table.  Glue was placed  on all the components.  Glue was placed over the proximal end of the tibia,  and the tibial tray was inserted and driven in place.  Excess glue was  removed.  Glue was placed on the posterior runners of the femoral component  and over the distal end of the femur, and the femoral component was reduced  and implanted over the distal end of the femur once the trial poly had been  put in place.  All excess glue was removed.  Knee was held in extension, and  glue was placed in the posterior aspect of the patella.  The patella  component was inserted and held in place with clamp.  As the glue was  setting, all excess glue was removed in a standard manner.  Once the glue  had cured, knee was flexed.  Excess glue was removed with a small osteotome  and all debris removed.  The knee was put through a full range of motion.  There was excellent stability, full flexion, full extension as noted above,  excellent soft tissue balancing.  The trial tibial poly was then removed,  tourniquet was dropped, and bleeders were coagulated.  Excellent hemostasis  was achieved.  The final tibial poly was then snap-fitted in place.  The  long medial parapatellar incision was closed with interrupted  Ethibond sutures.  A Hemovac was placed in the wound.  Vicryl was used to close the  subcutaneous tissue, and stainless steel staples were used to close the  skin.  Sterile dressings were applied, and the patient returned to recovery  room in excellent condition.  I was extremely pleased with the final  outcome.   DRAINS:  Hemovac.   COMPLICATIONS:  None.  ______________________________  Geroge Baseman Alphonzo Cruise, M.D.     RAM/MEDQ  D:  05/03/2006  T:  05/04/2006  Job:  [id number]

## 2010-11-11 NOTE — Discharge Summary (Signed)
   NAME:  Cassandra Adams, Cassandra [name]:  [id number]   MEDICAL RECORD NO.:  [id number]                   PATIENT TYPE:  INP   LOCATION:  A210                                 FACILITY:  [facility]   PHYSICIAN:  Bonne Dolores, M.D.                 DATE OF BIRTH:  [birth year]   DATE OF ADMISSION:  04/10/2003  DATE OF DISCHARGE:  04/14/2003                                 DISCHARGE SUMMARY   DISCHARGE DIAGNOSES:  1. Recurrent diabetic ketoacidosis secondary to noncompliance.  2. Longstanding history of insulin-dependent diabetes mellitus and history     of noncompliance in the past.  This is primarily a financial problem,     according to the patient.  3. History of hypertension, well controlled.   For details regarding admission please refer to admitting note.  Briefly,  this 65 year old female with the above history presented to the office with  a 24-hour history of vomiting and weakness.  She was obviously dehydrated  and ketotic.  She was sent to the emergency room for initiation of fluids  and further evaluation.   In the emergency department she was found to be mildly acidotic with pH of  7.3 and bicarbonate of 17.  Blood sugar was greater than 700 and she was  admitted for definitive therapy of diabetic ketoacidosis.   The patient admits to having run out of her insulin several days prior to  the above events.   COURSE IN THE HOSPITAL:  The patient did extremely well with prudent  hydration and insulin drip.  Lantus insulin was instituted.  She did respond  very well to this.  She was stable for discharge on hospital day #3.   DISPOSITION:  1. She is to be followed by home health.  2. Lantus is at 65 units q.h.s.  3. She is to continue her home medications which include Lotrel 5/10 one     daily and Diazide one daily.   Of note, the patient is allergic to PENICILLIN.   The patient will be treated expectantly as an outpatient.     ___________________________________________                                         Bonne Dolores, M.D.   MC/MEDQ  D:  05/09/2003  T:  05/09/2003  Job:  [id number]

## 2010-11-11 NOTE — Procedures (Signed)
NAMEMAURICE, [name]:  [id number]   MEDICAL RECORD NO.:  [id number]          PATIENT TYPE:  OUT   LOCATION:  RAD                           FACILITY:  [facility]   PHYSICIAN:  Leslye Peer, MD       DATE OF BIRTH:  Feb 11, 1946   DATE OF PROCEDURE:  06/29/2005  DATE OF DISCHARGE:                                  ECHOCARDIOGRAM   REFERRING PHYSICIAN:  Sherrilee Gilles. Gerarda Fraction, M.D.   INDICATIONS:  A 65 year old female with past medical history of  hypertension, diabetes referred for edema and possible TIA.   The technical quality of the study is a bit limited secondary to patient  body habitus and poor acoustic windows.   M-MODE TRACINGS:  The aorta measures normally at 3.2 cm.   Left atrium also measures normally at 3.9 cm.  The patient appeared to be in  sinus rhythm during this procedure.   The intraventricular septum and posterior wall are moderate to markedly  thickened.   The aortic valve appears to be trileaflet with normal leaflet excursion.  No  aortic insufficiency is noted.  Doppler interrogation of the aortic valve is  within normal limits.   Mitral valve appears grossly structurally normal.  No mitral valve prolapse  is noted.  Mild mitral regurgitation is noted.  Doppler interrogation of the  mitral valve is within normal limits.   Pulmonic valve is notable for with trace pulmonic insufficiency.   Tricuspid valve is not well-visualized, but appeared to be grossly  structurally normal with mild tricuspid regurgitation noted.   Left ventricle is somewhat small in cavity size.  Overall left ventricular  systolic function is normal and no regional wall motion abnormalities noted.  The presence of diastolic dysfunction is inferred from pulse wave Doppler  across the mitral valve.   The right ventricle at times appears to be somewhat generous, but with  preserved right ventricular systolic function.  The right atrium appears  normal in size.   IMPRESSION:  1.  Moderate to marked concentric left ventricular hypertrophy.  2.  Mild mitral and tricuspid regurgitation.  3.  Trivial pulmonic insufficiency.  4.  Small left ventricular cavity size with normal left systolic function      and no regional wall motion abnormalities      noted.  5.  Presence of diastolic dysfunction is inferred from pulsar Doppler across      the mitral valve.  6.  Right ventricle appears somewhat dilated, but with preserved right      ventricular systolic function.           ______________________________  Leslye Peer, MD     AB/MEDQ  D:  06/29/2005  T:  06/30/2005  Job:  [id number]

## 2010-11-11 NOTE — Consult Note (Signed)
NAMEBAILLIE, NASON NO.:  [id number]   MEDICAL RECORD NO.:  [id number]          PATIENT TYPE:  INP   LOCATION:  [id number]                         FACILITY:  [facility]   PHYSICIAN:  Sherryl Manges, M.D.  DATE OF BIRTH:  Nov 25, 1945   DATE OF CONSULTATION:  05/05/2006  DATE OF DISCHARGE:                                   CONSULTATION   REFERRING PHYSICIAN:  Geroge Baseman. Alphonzo Cruise, MD (Orthopedic Surgeon)   REASON FOR CONSULTATION:  Uncontrolled diabetes mellitus.   HISTORY OF PRESENT ILLNESS:  This is a 65 year old female.  For past medical  history, see below.  She was admitted on May 03, 2006 for severe right  knee osteoarthritis and underwent right total knee arthroplasty on May 03, 2006.  We have been consulted for severe, uncontrolled diabetes mellitus.  CBG is stated to be over 400.   PAST MEDICAL HISTORY:  1. Osteoarthritis.  2. Status post right TKA, May 03, 2006.  3. Hypertension.  4. Diabetes mellitus.  5. Per history of recurrent DKA, secondary to noncompliance.  6. Status post tonsillectomy, 1972.   PREADMISSION MEDICATIONS:  1. Hyzaar (100/25) one p.o. daily.  2. Plavix 75 mg p.o. daily.  3. Diltiazem 120 mg every day.  4. Amlodipine/benazepril (10/20) 1 p.o. daily.  5. Humalog sliding scale insulin.  6. Lantus 25 units subcutaneously q. a.m.   ALLERGIES:  PENICILLIN.  THIS CAUSES ANGIOEDEMA.   SOCIAL HISTORY:  The patient is married.  She is a nonsmoker, nondrinker.  Has no history of drug abuse.   FAMILY HISTORY:  The patient's father died at age 55 years from lung cancer.  Her mother died at 38 years status post MI.  Her brother is status post MI  at age 76 years.  He is still living.  Family history is otherwise  noncontributory.   PHYSICAL EXAMINATION:  VITAL SIGNS:  Temperature 101.3, pulse 94 per minute,  regular, respiratory rate 18, BP 125/63 mmHg.  Pulse oximetry 95% on room  air.  GENERAL:  The patient does not appear to  be in obvious acute distress.  She  is alert, communicative, not short of breath at rest, sitting comfortably in  a chair and chatting with visitors.  HEENT:  No clinical pallor.  No jaundice or conjunctival injection.  NECK:  Supple.  JVP not seen.  No palpable lymphadenopathy.  No palpable  goiter.  No carotid bruits.  CHEST:  Clear to auscultation.  No wheezes.  No crackles.  Heart sounds 1  and 2 heard.  Normal, regular.  No murmurs.  ABDOMEN:  Morbidly obese, otherwise unremarkable.  EXTREMITY EXAMINATION:  The patient has bilateral TED stockings and right  knee is under dressings.  However, no pitting edema is noted.  MUSCULOSKELETAL:  Was not formally examined.  CENTRAL NERVOUS SYSTEM:  No focal neurologic deficit on gross examination.   INVESTIGATIONS:  CBC:  WBC 13.6, hemoglobin 10.1, hematocrit 29.1, platelets  167.  Electrolytes:  Sodium 138, potassium 4.0, chloride 107, CO2 23, BUN  18, creatinine 1.2, glucose 341.  Rechecked 364.  CBG 285.   ASSESSMENT AND PLAN/RECOMMENDATIONS:  1.  SEVERE RIGHT KNEE OSTEOARTHRITIS:  Now status post right total knee      arthroplasty, postoperative day 2.  The patient appears clinically      stable.  Not in undue pain.  We shall defer management to Orthopedics.   1. UNCONTROLLED DIABETES MELLITUS:  The patient's CBGs have been ranging      from the high 200s to the high 300s since admission.  She is currently      on Lantus 25 units subcutaneously.  We shall increase Lantus insulin,      add meal coverage with NovoLog 4 units, and change sliding scale      insulin to resistant scale. For completeness, hemoglobin A1C to      evaluate overall diabetes control.   1. PYREXIA:  The patient has no localizing signs.  Denies cough, chest      pain or shortness of breath.  This may be secondary to atelectasis,      however, we cannot exclude a possible urinary tract infection (UTI).      We shall do septic screen, including blood cultures x 2,  chest x-ray,      urinalysis, and address findings appropriately.   1. HYPERTENSION:  This appears controlled.  We shall monitor only, for      now, and continue current antihypertensive medication.   Thank you for the consultation.  We will follow with you.      Sherryl Manges, M.D.  Electronically Signed     CO/MEDQ  D:  05/05/2006  T:  05/06/2006  Job:  [id number]   cc:   Christin Fudge, C.N.M.  Rodney A. Alphonzo Cruise, M.D.

## 2010-11-11 NOTE — H&P (Signed)
NAME:  Cassandra Adams, Cassandra [name]:  [id number]   MEDICAL RECORD NO.:  [id number]                   PATIENT TYPE:  INP   LOCATION:  IC03                                 FACILITY:  [facility]   PHYSICIAN:  Bonne Dolores, M.D.                 DATE OF BIRTH:  January 17, 1946   DATE OF ADMISSION:  04/10/2003  DATE OF DISCHARGE:                                HISTORY & PHYSICAL   CHIEF COMPLAINT:  Nausea, vomiting, weakness.   HISTORY OF PRESENT ILLNESS:  This is a 65 year old female with an  approximate 18-year history of diabetes mellitus.  She has been insulin-  dependent for a number of years.  She also has a history of hypertension.  She has been noncompliant in the past and admitted on several occasions with  ketoacidosis.   The patient was brought to the office with a 24-hour history of vomiting and  weakness.  She was obviously dehydrated and ketotic.  She was sent to the  emergency department for further evaluation and initiation of fluids, etc.   In the emergency department she was found to be mildly acidotic with a pH of  7.3 and a bicarbonate of 17.  Blood sugar was greater than 700.  She was  admitted for definitive therapy of diabetic ketoacidosis.   On further questioning, the patient admits to having run out of her insulin  several days prior to the above events.  As noted, this has occurred in the  past.   Other pertinent laboratories obtained in the emergency department reveal a  white count 8000, normal H&H.  BUN and creatinine of 42 and 1.9, calcium of  11, potassium 5.3.  Urine clear.   There is no history of headache, neurologic deficits, chest pain, shortness  of breath, diarrhea, melena, hematemesis, hematochezia, or genitourinary  symptoms.   CURRENT MEDICATIONS:  1. Lotrel 5/10, 1 daily.  2. Diazide 1 daily.  3. Novolin 70/30, usual dose 34 q.a.m. and 20 q.p.m.   ALLERGIES:  PENICILLIN.   PAST MEDICAL HISTORY:  As noted above.   REVIEW OF SYSTEMS:  Negative except as mentioned.   FAMILY HISTORY:  Noncontributory.   PHYSICAL EXAMINATION:  GENERAL:  This is a pleasant but weak-appearing  female in moderate distress.  VITAL SIGNS:  At presentation, temperature was 98, BP 150/80, heart rate 94,  respirations 18.  Mild ketones are present on her breath.  HEENT:  Normocephalic, atraumatic.  Pupils are equal.  Ears, nose, throat  are benign except for marked dehydration of mucous membranes.  NECK:  Supple.  Without bruits, lymphadenopathy, or thyromegaly.  LUNGS:  Clear to A&P.  HEART:  Heart sounds are distant.  No murmurs, rubs, or gallops.  ABDOMEN:  Nontender, nondistended.  Bowel sounds are intact.  EXTREMITIES:  No clubbing, cyanosis, or edema.  NEUROLOGIC:  Within normal limits.   PERTINENT LABORATORIES:  As noted above.   ASSESSMENT:  Marked  hyperglycemia with mild diabetic ketoacidosis, most  likely precipitated by noncompliance.   PLAN:  Admit for aggressive hydration, insulin drip, close monitoring of her  electrolytes.  Will begin potassium supplementation.  Of note, 10 units of  insulin IV bolus and drip have been begun in the emergency department.  Will  follow and treat expectantly.     ___________________________________________                                         Bonne Dolores, M.D.   MC/MEDQ  D:  04/11/2003  T:  04/11/2003  Job:  [id number]

## 2011-02-10 ENCOUNTER — Other Ambulatory Visit: Payer: Self-pay | Admitting: Gastroenterology

## 2011-05-05 ENCOUNTER — Other Ambulatory Visit (HOSPITAL_COMMUNITY): Payer: Self-pay | Admitting: Internal Medicine

## 2011-05-05 DIAGNOSIS — Z139 Encounter for screening, unspecified: Secondary | ICD-10-CM

## 2011-05-11 ENCOUNTER — Ambulatory Visit (HOSPITAL_COMMUNITY)
Admission: RE | Admit: 2011-05-11 | Discharge: 2011-05-11 | Disposition: A | Discharge status: Home / Self Care | Payer: Medicare Other | Source: Ambulatory Visit | Attending: Internal Medicine | Admitting: Internal Medicine

## 2011-05-11 DIAGNOSIS — Z139 Encounter for screening, unspecified: Secondary | ICD-10-CM

## 2011-05-11 DIAGNOSIS — Z780 Asymptomatic menopausal state: Secondary | ICD-10-CM | POA: Insufficient documentation

## 2011-05-11 DIAGNOSIS — M899 Disorder of bone, unspecified: Secondary | ICD-10-CM | POA: Insufficient documentation

## 2011-08-17 ENCOUNTER — Other Ambulatory Visit: Payer: Self-pay

## 2011-08-17 MED ORDER — OMEPRAZOLE 20 MG PO CPDR: 20.0000 mg | DELAYED_RELEASE_CAPSULE | Freq: Two times a day (BID) | ORAL | Status: DC

## 2011-10-17 ENCOUNTER — Other Ambulatory Visit (HOSPITAL_COMMUNITY): Payer: Self-pay | Admitting: Internal Medicine

## 2011-10-17 DIAGNOSIS — E109 Type 1 diabetes mellitus without complications: Secondary | ICD-10-CM

## 2011-10-19 ENCOUNTER — Ambulatory Visit (HOSPITAL_COMMUNITY)
Admission: RE | Admit: 2011-10-19 | Discharge: 2011-10-19 | Disposition: A | Discharge status: Home / Self Care | Payer: Medicare Other | Source: Ambulatory Visit | Attending: Internal Medicine | Admitting: Internal Medicine

## 2011-10-19 ENCOUNTER — Other Ambulatory Visit (HOSPITAL_COMMUNITY): Payer: Self-pay | Admitting: Internal Medicine

## 2011-10-19 DIAGNOSIS — E119 Type 2 diabetes mellitus without complications: Secondary | ICD-10-CM | POA: Insufficient documentation

## 2011-10-19 DIAGNOSIS — I6529 Occlusion and stenosis of unspecified carotid artery: Secondary | ICD-10-CM | POA: Insufficient documentation

## 2011-10-19 DIAGNOSIS — I10 Essential (primary) hypertension: Secondary | ICD-10-CM | POA: Insufficient documentation

## 2011-10-19 DIAGNOSIS — E109 Type 1 diabetes mellitus without complications: Secondary | ICD-10-CM

## 2011-10-19 DIAGNOSIS — I739 Peripheral vascular disease, unspecified: Secondary | ICD-10-CM | POA: Insufficient documentation

## 2011-10-19 DIAGNOSIS — R55 Syncope and collapse: Secondary | ICD-10-CM | POA: Insufficient documentation

## 2011-10-23 ENCOUNTER — Telehealth: Payer: Self-pay

## 2011-10-23 NOTE — Telephone Encounter (Signed)
Called and spoke with pt. She said she is not feeling well now. She will call in week or so when she feels better.

## 2011-11-16 ENCOUNTER — Encounter: Payer: Self-pay | Admitting: Vascular Surgery

## 2011-11-16 NOTE — Telephone Encounter (Signed)
Called pt. She is having to see another specialist for neuropathy. Said she will not be ready to do this for awhile, but she will call when she is ready. Sending a letter to PCP.

## 2011-11-17 ENCOUNTER — Other Ambulatory Visit: Payer: Medicare Other

## 2011-11-17 ENCOUNTER — Encounter: Payer: Self-pay | Admitting: Vascular Surgery

## 2011-11-17 ENCOUNTER — Ambulatory Visit (INDEPENDENT_AMBULATORY_CARE_PROVIDER_SITE_OTHER): Payer: Medicare Other | Admitting: Vascular Surgery

## 2011-11-17 VITALS — BP 188/69 | HR 56 | Temp 98.3°F | Ht 66.0 in | Wt 216.0 lb

## 2011-11-17 DIAGNOSIS — I658 Occlusion and stenosis of other precerebral arteries: Secondary | ICD-10-CM

## 2011-11-17 DIAGNOSIS — I6523 Occlusion and stenosis of bilateral carotid arteries: Secondary | ICD-10-CM | POA: Insufficient documentation

## 2011-11-17 NOTE — Progress Notes (Signed)
[facility]  New Carotid Patient  Referred by:  Sherrilee Gilles. Gerarda Fraction, MD [id number] [place], [place] [address]  Reason for referral: R carotid stenosis  History of Present Illness  Cassandra Adams is a 66 y.o. (May 16, 1946) female who presents with chief complaint: narrowing in artery.  The patient is no certain which sx resulted in a caroti duplex but previous carotid studies demonstrated: RICA 0000000 stenosis, LICA Q000111Q stenosis.  Patient has no history of TIA or stroke symptom.  The patient has never had amaurosis fugax or monocular blindness.  The patient has never had facial drooping or hemiplegia.  The patient has never had receptive or expressive aphasia.   The patient's risks factors for carotid disease include: DM, marginal hyperlipidemia,.  Past Medical History  Diagnosis Date  . Diabetes mellitus     Past Surgical History  Procedure Date  . Replacement total knee 05-03-06  . Tonsillectomy 1972    History   Social History  . Marital Status: Legally Separated    Spouse Name: N/A    Number of Children: N/A  . Years of Education: N/A   Occupational History  . Not on file.   Social History Main Topics  . Smoking status: Never Smoker   . Smokeless tobacco: Never Used  . Alcohol Use: No  . Drug Use: No  . Sexually Active:    Other Topics Concern  . Not on file   Social History Narrative  . No narrative on file    Family History  Problem Relation Age of Onset  . Hyperlipidemia Mother   . Hypertension Mother   . Cancer Father     lung  . Hypertension Sister   . Diabetes Brother   . Heart disease Brother   . Hyperlipidemia Brother   . Hypertension Brother   . Heart attack Brother   . Other Brother     DVT    Current Outpatient Prescriptions on File Prior to Visit  Medication Sig Dispense Refill  . amLODipine (NORVASC) 10 MG tablet Take 10 mg by mouth daily.      [name] atenolol (TENORMIN) 25 MG tablet  Take 25 mg by mouth daily.      [name] diltiazem (TIAZAC) 180 MG 24 hr capsule Take 180 mg by mouth daily.      . furosemide (LASIX) 40 MG tablet Take 40 mg by mouth daily.      . insulin aspart (NOVOLOG) 100 UNIT/ML injection Inject into the skin 3 (three) times daily before meals. Units vary      . insulin glargine (LANTUS) 100 UNIT/ML injection Inject 2 Units into the skin at bedtime.      [name] lisinopril (PRINIVIL,ZESTRIL) 20 MG tablet Take 20 mg by mouth daily.      . metoCLOPramide (REGLAN) 5 MG tablet Take 5 mg by mouth 4 (four) times daily as needed.      [name] omeprazole (PRILOSEC) 20 MG capsule Take 1 capsule (20 mg total) by mouth 2 (two) times daily.  60 capsule  3    Allergies  Allergen Reactions  . Penicillins     REVIEW OF SYSTEMS:  (Positives checked otherwise negative)  CARDIOVASCULAR: [ ]  chest pain    [ ]  chest pressure    [ ]  palpitations    [ ]  orthopnea   [ ]  dyspnea on exert. [ ]  claudication    [ ]  rest pain     [ ]  DVT     [  x] swelling in legs  PULMONARY:    [ ]  productive cough [ ]  asthma  [ ]  wheezing  NEUROLOGIC:    [x]  weakness    [ ]  paresthesias   [ ]  aphasia    [ ]  amaurosis    [x]  dizziness  HEMATOLOGIC:    [ ]  bleeding problems  [ ]  clotting disorders  MUSCULOSKEL: [ ]  joint pain     [ ]  joint swelling [x]  pain in legs with walking [x]  pain in feet when laying flat    GASTROINTEST:  [ ]   blood in stool   [ ]   hematemesis  GENITOURINARY:   [ ]   dysuria    [ ]   hematuria  PSYCHIATRIC:   [ ]  history of major depression  INTEGUMENTARY: [ ]  rashes    [ ]  ulcers  CONSTITUTIONAL:  [ ]  fever     [ ]  chills  Physical Examination  Filed Vitals:   11/17/11 1320 11/17/11 1321  BP: 177/67 188/69  Pulse: 56 56  Temp: 98.3 F (36.8 C)   TempSrc: Oral   Height: 5\' 6"  (1.676 m)   Weight: 216 lb (97.977 kg)   SpO2: 100%    Body mass index is 34.86 kg/(m^2).  General: A&O x 3, WDWN, obese  Head: [place]/AT  Ear/Nose/Throat: Hearing grossly intact, nares w/o  erythema or drainage, oropharynx w/o Erythema/Exudate  Eyes: PERRLA, EOMI  Neck: Supple, no nuchal rigidity, no palpable LAD  Pulmonary: Sym exp, good air movt, CTAB, no rales, rhonchi, & wheezing  Cardiac: RRR, Nl S1, S2, no Murmurs, rubs or gallops  Vascular: Vessel Right Left  Radial Palpable Palpable  Brachial Palpable Palpable  Carotid Palpable, without bruit Palpable, without bruit  Aorta Non-palpable N/A  Femoral Palpable Palpable  Popliteal Non-palpable Non-palpable  PT Palpable Palpable  DP Palpable Palpable   Gastrointestinal: soft, NTND, -G/R, - HSM, - masses, - CVAT B  Musculoskeletal: M/S 5/5 throughout , Extremities without ischemic changes   Neurologic: CN 2-12 intact , Pain and light touch intact in extremities except decreased sensation in both feet, Motor exam as listed above  Psychiatric: Judgment intact, Mood & affect appropriate for pt's clinical situation  Dermatologic: See M/S exam for extremity exam, no rashes otherwise noted  Lymph : No Cervical, Axillary, or Inguinal lymphadenopathy   Outside Studies/Documentation 15 pages of outside documents were reviewed including: clinic chart detailing possible statin reaction and B carotid duplex: R ICA 162/18 c/s, L ICA 110/21 c/s  Medical Decision Making  [name] is a 66 y.o. female who presents with: R ICA stenosis 50-69%, L ICA stenosis < 50%   Based on the patient's vascular studies and examination, I have offered the patient: annual surveillance.  She will follow up in one year with my NP.  I discussed in depth with the patient the nature of atherosclerosis, and emphasized the importance of maximal medical management including strict control of blood pressure, blood glucose, and lipid levels, obtaining regular exercise, antiplatelet agents, and cessation of smoking.  The patient is aware that without maximal medical management the underlying atherosclerotic disease process will progress,  limiting the benefit of any interventions.  Thank you for allowing [place] to participate in this patient's care.  Adele Barthel, MD [facility] Office: [phone] Pager: [phone]  11/17/2011, 1:38 PM

## 2011-12-15 ENCOUNTER — Other Ambulatory Visit: Payer: Self-pay | Admitting: Gastroenterology

## 2011-12-15 NOTE — Telephone Encounter (Signed)
RX with one refills. Needs OV by 01/2012. Last seen 01/2010.

## 2011-12-19 NOTE — Progress Notes (Unsigned)
Cassandra Adams, please nic ov in 01/2012. Thanks.       Neil Crouch, PA 12/15/2011 2:27 PM Signed  RX with one refills. Needs OV by 01/2012. Last seen 01/2010.

## 2011-12-25 ENCOUNTER — Encounter: Payer: Self-pay | Admitting: Gastroenterology

## 2011-12-25 NOTE — Progress Notes (Signed)
Pt is aware of OV for 7/18 @ 11 with LSL and appt card was mailed

## 2012-01-10 ENCOUNTER — Encounter: Payer: Self-pay | Admitting: Gastroenterology

## 2012-01-11 ENCOUNTER — Telehealth: Payer: Self-pay | Admitting: Gastroenterology

## 2012-01-11 ENCOUNTER — Ambulatory Visit: Payer: Medicare Other | Admitting: Gastroenterology

## 2012-01-11 NOTE — Telephone Encounter (Signed)
Pt was a no show

## 2012-02-16 ENCOUNTER — Other Ambulatory Visit: Payer: Self-pay | Admitting: Gastroenterology

## 2012-08-24 HISTORY — PX: APPENDECTOMY: SHX54

## 2012-09-15 ENCOUNTER — Observation Stay (HOSPITAL_COMMUNITY)
Admission: EM | Admit: 2012-09-15 | Discharge: 2012-09-16 | Disposition: A | Discharge status: Home / Self Care | Payer: Medicare Other | Attending: General Surgery | Admitting: General Surgery

## 2012-09-15 ENCOUNTER — Encounter (HOSPITAL_COMMUNITY)
Admission: EM | Disposition: A | Discharge status: Home / Self Care | Payer: Self-pay | Source: Home / Self Care | Attending: General Surgery

## 2012-09-15 ENCOUNTER — Encounter (HOSPITAL_COMMUNITY): Payer: Self-pay | Admitting: Anesthesiology

## 2012-09-15 ENCOUNTER — Emergency Department (HOSPITAL_COMMUNITY): Payer: Medicare Other

## 2012-09-15 ENCOUNTER — Encounter (HOSPITAL_COMMUNITY): Payer: Self-pay

## 2012-09-15 ENCOUNTER — Emergency Department (HOSPITAL_COMMUNITY): Payer: Medicare Other | Admitting: Anesthesiology

## 2012-09-15 DIAGNOSIS — Z01812 Encounter for preprocedural laboratory examination: Secondary | ICD-10-CM | POA: Insufficient documentation

## 2012-09-15 DIAGNOSIS — K3580 Unspecified acute appendicitis: Principal | ICD-10-CM | POA: Insufficient documentation

## 2012-09-15 DIAGNOSIS — E119 Type 2 diabetes mellitus without complications: Secondary | ICD-10-CM | POA: Insufficient documentation

## 2012-09-15 DIAGNOSIS — I10 Essential (primary) hypertension: Secondary | ICD-10-CM | POA: Insufficient documentation

## 2012-09-15 DIAGNOSIS — Z01810 Encounter for preprocedural cardiovascular examination: Secondary | ICD-10-CM | POA: Insufficient documentation

## 2012-09-15 DIAGNOSIS — Z01818 Encounter for other preprocedural examination: Secondary | ICD-10-CM | POA: Insufficient documentation

## 2012-09-15 HISTORY — PX: LAPAROSCOPIC APPENDECTOMY: SHX408

## 2012-09-15 HISTORY — DX: Essential (primary) hypertension: I10

## 2012-09-15 LAB — CBC WITH DIFFERENTIAL/PLATELET
Basophils Relative: 0 % (ref 0–1)
Eosinophils Relative: 3 % (ref 0–5)
HCT: 36.8 % (ref 36.0–46.0)
Hemoglobin: 12.9 g/dL (ref 12.0–15.0)
Lymphocytes Relative: 17 % (ref 12–46)
Monocytes Relative: 9 % (ref 3–12)
Neutrophils Relative %: 71 % (ref 43–77)
RBC: 5.01 MIL/uL (ref 3.87–5.11)

## 2012-09-15 LAB — COMPREHENSIVE METABOLIC PANEL
ALT: 12 U/L (ref 0–35)
Alkaline Phosphatase: 117 U/L (ref 39–117)
CO2: 23 mEq/L (ref 19–32)
Chloride: 108 mEq/L (ref 96–112)
GFR calc Af Amer: 59 mL/min — ABNORMAL LOW (ref >90)
GFR calc non Af Amer: 51 mL/min — ABNORMAL LOW (ref >90)
Glucose, Bld: 98 mg/dL (ref 70–99)
Potassium: 3.9 mEq/L (ref 3.5–5.1)
Sodium: 140 mEq/L (ref 135–145)
Total Bilirubin: 0.2 mg/dL — ABNORMAL LOW (ref 0.3–1.2)
Total Protein: 7 g/dL (ref 6.0–8.3)

## 2012-09-15 LAB — URINALYSIS, ROUTINE W REFLEX MICROSCOPIC
Bilirubin Urine: NEGATIVE
Specific Gravity, Urine: 1.025 (ref 1.005–1.030)
Urobilinogen, UA: 0.2 mg/dL (ref 0.0–1.0)

## 2012-09-15 LAB — GLUCOSE, CAPILLARY
Glucose-Capillary: 114 mg/dL — ABNORMAL HIGH (ref 70–99)
Glucose-Capillary: 99 mg/dL (ref 70–99)

## 2012-09-15 LAB — HEMOGLOBIN A1C: Hgb A1c MFr Bld: 7.7 % — ABNORMAL HIGH (ref <5.7)

## 2012-09-15 LAB — URINE MICROSCOPIC-ADD ON

## 2012-09-15 SURGERY — APPENDECTOMY, LAPAROSCOPIC
Anesthesia: General | Wound class: Contaminated

## 2012-09-15 MED ORDER — AMLODIPINE BESYLATE 5 MG PO TABS
10.0000 mg | ORAL_TABLET | Freq: Every day | ORAL | Status: DC
  Administered 2012-09-15 – 2012-09-16 (×2): 10 mg via ORAL
  Filled 2012-09-15 (×2): qty 2

## 2012-09-15 MED ORDER — ONDANSETRON HCL 4 MG/2ML IJ SOLN
4.0000 mg | Freq: Four times a day (QID) | INTRAMUSCULAR | Status: DC | PRN
  Administered 2012-09-15: 4 mg via INTRAVENOUS
  Filled 2012-09-15: qty 2

## 2012-09-15 MED ORDER — INSULIN GLARGINE 100 UNIT/ML ~~LOC~~ SOLN
27.0000 [IU] | Freq: Every day | SUBCUTANEOUS | Status: DC
  Administered 2012-09-15: 27 [IU] via SUBCUTANEOUS
  Filled 2012-09-15: qty 0.27

## 2012-09-15 MED ORDER — HYDROMORPHONE HCL PF 1 MG/ML IJ SOLN
1.0000 mg | Freq: Once | INTRAMUSCULAR | Status: AC
  Administered 2012-09-15: 1 mg via INTRAVENOUS
  Filled 2012-09-15: qty 1

## 2012-09-15 MED ORDER — CLINDAMYCIN PHOSPHATE 600 MG/50ML IV SOLN
INTRAVENOUS | Status: AC
  Filled 2012-09-15: qty 100

## 2012-09-15 MED ORDER — EPHEDRINE SULFATE 50 MG/ML IJ SOLN
INTRAMUSCULAR | Status: DC | PRN
  Administered 2012-09-15: 5 mg via INTRAVENOUS

## 2012-09-15 MED ORDER — CLINDAMYCIN PHOSPHATE 900 MG/50ML IV SOLN
900.0000 mg | Freq: Once | INTRAVENOUS | Status: AC
  Administered 2012-09-15: 900 mg via INTRAVENOUS
  Filled 2012-09-15: qty 50

## 2012-09-15 MED ORDER — DEXTROSE 50 % IV SOLN
INTRAVENOUS | Status: DC | PRN
  Administered 2012-09-15: 12.5 g via INTRAVENOUS

## 2012-09-15 MED ORDER — BUPIVACAINE HCL (PF) 0.5 % IJ SOLN
INTRAMUSCULAR | Status: DC | PRN
  Administered 2012-09-15: 10 mL

## 2012-09-15 MED ORDER — ACETAMINOPHEN 10 MG/ML IV SOLN
1000.0000 mg | Freq: Four times a day (QID) | INTRAVENOUS | Status: AC
  Administered 2012-09-15 – 2012-09-16 (×4): 1000 mg via INTRAVENOUS
  Filled 2012-09-15 (×3): qty 100

## 2012-09-15 MED ORDER — LISINOPRIL-HYDROCHLOROTHIAZIDE 20-25 MG PO TABS
1.0000 | ORAL_TABLET | Freq: Every day | ORAL | Status: DC
Start: 2012-09-15 — End: 2012-09-15

## 2012-09-15 MED ORDER — DIPHENHYDRAMINE HCL 50 MG/ML IJ SOLN
25.0000 mg | Freq: Four times a day (QID) | INTRAMUSCULAR | Status: DC | PRN
  Administered 2012-09-15: 25 mg via INTRAVENOUS
  Filled 2012-09-15: qty 1

## 2012-09-15 MED ORDER — FENTANYL CITRATE 0.05 MG/ML IJ SOLN
INTRAMUSCULAR | Status: DC | PRN
  Administered 2012-09-15 (×2): 25 ug via INTRAVENOUS

## 2012-09-15 MED ORDER — GLYCOPYRROLATE 0.2 MG/ML IJ SOLN
INTRAMUSCULAR | Status: DC | PRN
  Administered 2012-09-15: 0.4 mg via INTRAVENOUS

## 2012-09-15 MED ORDER — FUROSEMIDE 40 MG PO TABS
40.0000 mg | ORAL_TABLET | Freq: Every day | ORAL | Status: DC
  Administered 2012-09-15 – 2012-09-16 (×2): 40 mg via ORAL
  Filled 2012-09-15 (×2): qty 1

## 2012-09-15 MED ORDER — SODIUM CHLORIDE 0.9 % IR SOLN
Status: DC | PRN
  Administered 2012-09-15: 1000 mL

## 2012-09-15 MED ORDER — SODIUM CHLORIDE 0.9 % IV SOLN
INTRAVENOUS | Status: DC
  Administered 2012-09-15: 1000 mL via INTRAVENOUS
  Administered 2012-09-16: 06:00:00 via INTRAVENOUS

## 2012-09-15 MED ORDER — ONDANSETRON HCL 4 MG PO TABS: 4.0000 mg | ORAL_TABLET | Freq: Four times a day (QID) | ORAL | Status: DC | PRN

## 2012-09-15 MED ORDER — MIDAZOLAM HCL 5 MG/5ML IJ SOLN
INTRAMUSCULAR | Status: DC | PRN
  Administered 2012-09-15: 1 mg via INTRAVENOUS

## 2012-09-15 MED ORDER — ACETAMINOPHEN 10 MG/ML IV SOLN
INTRAVENOUS | Status: AC
  Filled 2012-09-15: qty 200

## 2012-09-15 MED ORDER — ATENOLOL 25 MG PO TABS
25.0000 mg | ORAL_TABLET | Freq: Once | ORAL | Status: AC
  Administered 2012-09-15: 25 mg via ORAL
  Filled 2012-09-15 (×2): qty 1

## 2012-09-15 MED ORDER — ONDANSETRON HCL 4 MG/2ML IJ SOLN
INTRAMUSCULAR | Status: DC | PRN
  Administered 2012-09-15: 4 mg via INTRAVENOUS

## 2012-09-15 MED ORDER — INSULIN ASPART 100 UNIT/ML ~~LOC~~ SOLN: 0.0000 [IU] | Freq: Three times a day (TID) | SUBCUTANEOUS | Status: DC

## 2012-09-15 MED ORDER — INSULIN GLARGINE 100 UNIT/ML ~~LOC~~ SOLN
SUBCUTANEOUS | Status: AC
  Filled 2012-09-15: qty 10

## 2012-09-15 MED ORDER — COLESEVELAM HCL 625 MG PO TABS
1875.0000 mg | ORAL_TABLET | Freq: Two times a day (BID) | ORAL | Status: DC
Start: 2012-09-15 — End: 2012-09-16
  Administered 2012-09-15 – 2012-09-16 (×2): 1875 mg via ORAL
  Filled 2012-09-15 (×2): qty 3

## 2012-09-15 MED ORDER — ROCURONIUM BROMIDE 100 MG/10ML IV SOLN
INTRAVENOUS | Status: DC | PRN
  Administered 2012-09-15: 5 mg via INTRAVENOUS
  Administered 2012-09-15: 20 mg via INTRAVENOUS

## 2012-09-15 MED ORDER — CLONIDINE HCL 0.1 MG PO TABS
0.1000 mg | ORAL_TABLET | Freq: Three times a day (TID) | ORAL | Status: DC
  Administered 2012-09-15 – 2012-09-16 (×3): 0.1 mg via ORAL
  Filled 2012-09-15 (×3): qty 1

## 2012-09-15 MED ORDER — ATENOLOL 25 MG PO TABS
25.0000 mg | ORAL_TABLET | Freq: Every day | ORAL | Status: DC
  Administered 2012-09-15 – 2012-09-16 (×2): 25 mg via ORAL
  Filled 2012-09-15 (×2): qty 1

## 2012-09-15 MED ORDER — NEOSTIGMINE METHYLSULFATE 1 MG/ML IJ SOLN
INTRAMUSCULAR | Status: DC | PRN
  Administered 2012-09-15: 2 mg via INTRAVENOUS

## 2012-09-15 MED ORDER — CLINDAMYCIN PHOSPHATE 600 MG/50ML IV SOLN
600.0000 mg | Freq: Three times a day (TID) | INTRAVENOUS | Status: DC
Start: 2012-09-15 — End: 2012-09-16
  Administered 2012-09-15 – 2012-09-16 (×2): 600 mg via INTRAVENOUS
  Filled 2012-09-15 (×3): qty 50

## 2012-09-15 MED ORDER — MORPHINE SULFATE 2 MG/ML IJ SOLN: 2.0000 mg | INTRAMUSCULAR | Status: DC | PRN

## 2012-09-15 MED ORDER — INSULIN GLARGINE 100 UNIT/ML ~~LOC~~ SOLN: 2.0000 [IU] | Freq: Every day | SUBCUTANEOUS | Status: DC

## 2012-09-15 MED ORDER — ENOXAPARIN SODIUM 40 MG/0.4ML ~~LOC~~ SOLN
40.0000 mg | Freq: Once | SUBCUTANEOUS | Status: AC
  Administered 2012-09-15: 40 mg via SUBCUTANEOUS
  Filled 2012-09-15: qty 0.4

## 2012-09-15 MED ORDER — LIDOCAINE HCL (CARDIAC) 10 MG/ML IV SOLN
INTRAVENOUS | Status: DC | PRN
  Administered 2012-09-15: 20 mg via INTRAVENOUS

## 2012-09-15 MED ORDER — ONDANSETRON HCL 4 MG/2ML IJ SOLN
4.0000 mg | Freq: Once | INTRAMUSCULAR | Status: AC
  Administered 2012-09-15: 4 mg via INTRAVENOUS
  Filled 2012-09-15: qty 2

## 2012-09-15 MED ORDER — ACETAMINOPHEN 10 MG/ML IV SOLN
INTRAVENOUS | Status: AC
  Filled 2012-09-15: qty 100

## 2012-09-15 MED ORDER — SODIUM CHLORIDE 0.9 % IV BOLUS (SEPSIS)
1000.0000 mL | Freq: Once | INTRAVENOUS | Status: AC
  Administered 2012-09-15: 1000 mL via INTRAVENOUS

## 2012-09-15 MED ORDER — HYDROCHLOROTHIAZIDE 25 MG PO TABS
25.0000 mg | ORAL_TABLET | Freq: Every day | ORAL | Status: DC
  Administered 2012-09-15 – 2012-09-16 (×2): 25 mg via ORAL
  Filled 2012-09-15 (×2): qty 1

## 2012-09-15 MED ORDER — DEXTROSE 50 % IV SOLN
INTRAVENOUS | Status: AC
  Filled 2012-09-15: qty 50

## 2012-09-15 MED ORDER — LISINOPRIL 10 MG PO TABS
20.0000 mg | ORAL_TABLET | Freq: Every day | ORAL | Status: DC
  Administered 2012-09-15 – 2012-09-16 (×2): 20 mg via ORAL
  Filled 2012-09-15 (×2): qty 2

## 2012-09-15 MED ORDER — SODIUM CHLORIDE 0.9 % IV SOLN
INTRAVENOUS | Status: DC | PRN
  Administered 2012-09-15: 14:00:00 via INTRAVENOUS

## 2012-09-15 MED ORDER — ENOXAPARIN SODIUM 40 MG/0.4ML ~~LOC~~ SOLN: 40.0000 mg | SUBCUTANEOUS | Status: DC

## 2012-09-15 MED ORDER — PROPOFOL 10 MG/ML IV BOLUS
INTRAVENOUS | Status: DC | PRN
  Administered 2012-09-15: 20 mg via INTRAVENOUS
  Administered 2012-09-15: 120 mg via INTRAVENOUS

## 2012-09-15 MED ORDER — SUCCINYLCHOLINE CHLORIDE 20 MG/ML IJ SOLN
INTRAMUSCULAR | Status: DC | PRN
  Administered 2012-09-15: 100 mg via INTRAVENOUS

## 2012-09-15 SURGICAL SUPPLY — 41 items
BAG HAMPER (MISCELLANEOUS) ×2 IMPLANT
BAG SPEC RTRVL LRG 6X4 10 (ENDOMECHANICALS) ×1
CLOTH BEACON ORANGE TIMEOUT ST (SAFETY) ×2 IMPLANT
COVER LIGHT HANDLE STERIS (MISCELLANEOUS) ×4 IMPLANT
CUTTER LINEAR ENDO 35 ETS (STAPLE) ×1 IMPLANT
DECANTER SPIKE VIAL GLASS SM (MISCELLANEOUS) ×1 IMPLANT
DURAPREP 26ML APPLICATOR (WOUND CARE) ×2 IMPLANT
ELECT REM PT RETURN 9FT ADLT (ELECTROSURGICAL) ×2
ELECTRODE REM PT RTRN 9FT ADLT (ELECTROSURGICAL) ×1 IMPLANT
FILTER SMOKE EVAC LAPAROSHD (FILTER) ×2 IMPLANT
FORMALIN 10 PREFIL 120ML (MISCELLANEOUS) ×2 IMPLANT
GLOVE BIO SURGEON STRL SZ7.5 (GLOVE) ×2 IMPLANT
GLOVE BIOGEL PI IND STRL 7.5 (GLOVE) IMPLANT
GLOVE BIOGEL PI INDICATOR 7.5 (GLOVE) ×1
GLOVE ECLIPSE 7.0 STRL STRAW (GLOVE) ×1 IMPLANT
GLOVE INDICATOR 7.0 STRL GRN (GLOVE) ×1 IMPLANT
GOWN STRL REIN XL XLG (GOWN DISPOSABLE) ×4 IMPLANT
INST SET LAPROSCOPIC AP (KITS) ×2 IMPLANT
KIT ROOM TURNOVER APOR (KITS) ×2 IMPLANT
MANIFOLD NEPTUNE II (INSTRUMENTS) ×2 IMPLANT
NDL INSUFFLATION 14GA 120MM (NEEDLE) ×1 IMPLANT
NEEDLE INSUFFLATION 14GA 120MM (NEEDLE) ×2 IMPLANT
NS IRRIG 1000ML POUR BTL (IV SOLUTION) ×2 IMPLANT
PACK LAP CHOLE LZT030E (CUSTOM PROCEDURE TRAY) ×2 IMPLANT
PAD ARMBOARD 7.5X6 YLW CONV (MISCELLANEOUS) ×2 IMPLANT
POUCH SPECIMEN RETRIEVAL 10MM (ENDOMECHANICALS) ×2 IMPLANT
SCALPEL HARMONIC ACE (MISCELLANEOUS) ×2 IMPLANT
SEALER TISSUE G2 CVD JAW 35 (ENDOMECHANICALS) IMPLANT
SEALER TISSUE G2 CVD JAW 45CM (ENDOMECHANICALS) ×1
SET BASIN LINEN APH (SET/KITS/TRAYS/PACK) ×2 IMPLANT
SPONGE GAUZE 2X2 8PLY STRL LF (GAUZE/BANDAGES/DRESSINGS) ×6 IMPLANT
STAPLER VISISTAT (STAPLE) ×2 IMPLANT
SUT VICRYL 0 UR6 27IN ABS (SUTURE) ×2 IMPLANT
TAPE CLOTH SURG 4X10 WHT LF (GAUZE/BANDAGES/DRESSINGS) ×1 IMPLANT
TRAY FOLEY CATH 14FR (SET/KITS/TRAYS/PACK) ×2 IMPLANT
TROCAR Z-THAD FIOS HNDL 12X100 (TROCAR) ×2 IMPLANT
TROCAR Z-THRD FIOS HNDL 11X100 (TROCAR) ×2 IMPLANT
TROCAR Z-THREAD FIOS 5X100MM (TROCAR) ×2 IMPLANT
WARMER LAPAROSCOPE (MISCELLANEOUS) ×2 IMPLANT
YANKAUER SUCT 12FT TUBE ARGYLE (SUCTIONS) ×1 IMPLANT
YANKAUER SUCT BULB TIP 10FT TU (MISCELLANEOUS) ×2 IMPLANT

## 2012-09-15 NOTE — Anesthesia Preprocedure Evaluation (Addendum)
Anesthesia Evaluation  Patient identified by MRN, date of birth, ID band Patient awake    Reviewed: Allergy & Precautions, H&P , NPO status , Patient's Chart, lab work & pertinent test results, reviewed documented beta blocker date and time   Airway Mallampati: I TM Distance: >3 FB Neck ROM: Full    Dental  (+) Edentulous Upper and Edentulous Lower   Pulmonary    Pulmonary exam normal       Cardiovascular hypertension, Pt. on medications and Pt. on home beta blockers + Peripheral Vascular Disease     Neuro/Psych  Headaches, TIA'S by hx, in the 90's CVA    GI/Hepatic GERD-  Controlled,Patient received Oral Contrast Agents,Contrast completed around 1000   Endo/Other  diabetes, Well Controlled, Type 2Hyperthyroidism   Renal/GU Renal InsufficiencyRenal disease     Musculoskeletal   Abdominal   Peds  Hematology   Anesthesia Other Findings   Reproductive/Obstetrics                          Anesthesia Physical Anesthesia Plan  ASA: III and emergent  Anesthesia Plan: General   Post-op Pain Management:    Induction: Rapid sequence, Cricoid pressure planned and Intravenous  Airway Management Planned: Oral ETT  Additional Equipment:   Intra-op Plan:   Post-operative Plan: Extubation in OR  Informed Consent: I have reviewed the patients History and Physical, chart, labs and discussed the procedure including the risks, benefits and alternatives for the proposed anesthesia with the patient or authorized representative who has indicated his/her understanding and acceptance.     Plan Discussed with: Surgeon  Anesthesia Plan Comments:         Anesthesia Quick Evaluation

## 2012-09-15 NOTE — ED Provider Notes (Signed)
History  This chart was scribed for Cassandra Pollack, MD, by Truddie Coco, ED Scribe. This patient was seen in room APA09/APA09 and the patient's care was started at 8:42 AM   CSN: [id number]  Arrival date & time 09/15/12  [id number]   First MD Initiated Contact with Patient 09/15/12 0830      Chief Complaint  Patient presents with  . Abdominal Pain    The history is provided by the patient. No language interpreter was used.   Cassandra Adams is a 67 y.o. female who presents to the Emergency Department via EMS complaining of constant RLQ pain that woke her up from sleep around 3 AM this morning.  She reports vomiting around 4 times this morning.  Pain is worse we movement.  Pt reports she felt normal last night and ate normally yesterday.  She denies hematuria, increased urination, cough, chest pain, or chills.  Pt is febrile in the ED with fever of 100.5.  Pt has h/o renal disorder and was recommended to go on dialysis in 2011 which she declined.      PCP [place] Past Medical History  Diagnosis Date  . Diabetes mellitus   . Hypertension   . Stroke     tia  . Renal disorder     Past Surgical History  Procedure Laterality Date  . Replacement total knee  05-03-06  . Tonsillectomy  1972  . Esophagogastroduodenoscopy  11/08/2007     Normal esophagus without evidence of Barrett, mass, erosion/ Normal stomach, duodenal bulb  . Gastric emptying study  11/13/2007    mildly delayed emptying subjectively, but normal  analysis-77% of tracer emptied at 2 hours  . Appendectomy      had appendix frozen    Family History  Problem Relation Age of Onset  . Hyperlipidemia Mother   . Hypertension Mother   . Cancer Father     lung  . Hypertension Sister   . Diabetes Brother   . Heart disease Brother   . Hyperlipidemia Brother   . Hypertension Brother   . Heart attack Brother   . Other Brother     DVT    History  Substance Use Topics  . Smoking status: Never Smoker   . Smokeless  tobacco: Never Used  . Alcohol Use: No    OB History   Grav Para Term Preterm Abortions TAB SAB Ect Mult Living                  Review of Systems  Constitutional: Positive for fever. Negative for chills.  Respiratory: Negative for cough.   Gastrointestinal: Positive for vomiting and abdominal pain. Negative for diarrhea.  All other systems reviewed and are negative.    Allergies  Penicillins  Home Medications   Current Outpatient Rx  Name  Route  Sig  Dispense  Refill  . amLODipine (NORVASC) 10 MG tablet   Oral   Take 10 mg by mouth daily.         [name] aspirin 81 MG tablet   Oral   Take 81 mg by mouth daily.         [name] atenolol (TENORMIN) 25 MG tablet   Oral   Take 25 mg by mouth daily.         [name] diltiazem (TIAZAC) 180 MG 24 hr capsule   Oral   Take 180 mg by mouth daily.         . furosemide (LASIX) 40 MG tablet  Oral   Take 40 mg by mouth daily.         . insulin aspart (NOVOLOG) 100 UNIT/ML injection   Subcutaneous   Inject into the skin 3 (three) times daily before meals. Units vary         . insulin glargine (LANTUS) 100 UNIT/ML injection   Subcutaneous   Inject 2 Units into the skin at bedtime.         [name] lisinopril (PRINIVIL,ZESTRIL) 20 MG tablet   Oral   Take 20 mg by mouth daily.         . meclizine (ANTIVERT) 25 MG tablet   Oral   Take 25 mg by mouth 3 (three) times daily as needed.         . metoCLOPramide (REGLAN) 5 MG tablet   Oral   Take 5 mg by mouth 4 (four) times daily as needed.         [name] omeprazole (PRILOSEC) 20 MG capsule      TAKE (1) CAPSULE BY MOUTH TWICE DAILY.   60 capsule   1     Pulse 68  Temp(Src) 100.5 F (38.1 C) (Oral)  Resp 18  Ht 5\' 6"  (1.676 m)  Wt 206 lb (93.441 kg)  BMI 33.27 kg/m2  SpO2 99%  Physical Exam  Nursing note and vitals reviewed. Constitutional: She is oriented to person, place, and time. She appears well-developed and well-nourished. No distress.  HENT:  Head:  Normocephalic and atraumatic.  Eyes: Conjunctivae are normal.  Neck: Normal range of motion.  Pulmonary/Chest: Effort normal. No respiratory distress.  Abdominal: There is tenderness (tenderness to RLQ, RUQ, LLQ).  Musculoskeletal: Normal range of motion. She exhibits no tenderness.  Neurological: She is alert and oriented to person, place, and time.  Skin: Skin is warm and dry. She is not diaphoretic.  Psychiatric: She has a normal mood and affect. Her behavior is normal.    ED Course  Procedures  DIAGNOSTIC STUDIES: Oxygen Saturation is 99% on room air, normal by my interpretation.    COORDINATION OF CARE:  8:50 AM Discussed course of care with pt which includes lab work.  Pt understands and agrees.   11:31 AM Discussed images with pt and need for appendectomy.  Pt understands and agrees.   Labs Reviewed  CBC WITH DIFFERENTIAL - Abnormal; Notable for the following:    MCV 73.5 (*)    MCH 25.7 (*)    All other components within normal limits  COMPREHENSIVE METABOLIC PANEL - Abnormal; Notable for the following:    BUN 28 (*)    Albumin 3.3 (*)    Total Bilirubin 0.2 (*)    GFR calc non Af Amer 51 (*)    GFR calc Af Amer 59 (*)    All other components within normal limits  LIPASE, BLOOD - Abnormal; Notable for the following:    Lipase 8 (*)    All other components within normal limits  URINALYSIS, ROUTINE W REFLEX MICROSCOPIC - Abnormal; Notable for the following:    Hgb urine dipstick TRACE (*)    Leukocytes, UA TRACE (*)    All other components within normal limits  URINE MICROSCOPIC-ADD ON - Abnormal; Notable for the following:    Squamous Epithelial / LPF FEW (*)    All other components within normal limits   Ct Abdomen Pelvis Wo Contrast  09/15/2012  *RADIOLOGY REPORT*  Clinical Data: Right-sided Abdominal pain  CT ABDOMEN AND PELVIS WITHOUT CONTRAST  Technique:  Multidetector  CT imaging of the abdomen and pelvis was performed following the standard protocol without  intravenous contrast.  Comparison: 12/28/2000 by report only  Findings: Minimal dependent atelectasis in the visualized lung bases.  Unremarkable uninfused evaluation of the liver, gallbladder, spleen, kidneys, pancreas.  No nephrolithiasis or hydronephrosis.  Scattered coarse aortoiliac atheromatous calcifications without aneurysm.  Stomach physiologically distended.  Small bowel and colon nondilated.  The appendix is distended up to 17 mm diameter, containing scattered gas bubbles and high-density material, with some mild wall thickening.  Minimal adjacent inflammatory/edematous change. No extraluminal gas or fluid.  Coarse calcifications in the uterus probably degenerated fibroids. Urinary bladder incompletely distended.  No ascites.  No free air. No adenopathy.  Degenerative changes in the lower thoracic and lumbar spine with grade 1 anterolisthesis L3-4 probably secondary to advanced facet degenerative changes at this level.  IMPRESSION: 1.  Mild appendicitis without evidence of perforation or abscess.   Original Report Authenticated By: D. Wallace Going, MD    Dg Chest 2 View  09/15/2012  *RADIOLOGY REPORT*  Clinical Data: Preop, abdominal pain  CHEST - 2 VIEW  Comparison: 09/15/2012 and 07/30/2009  Findings: Cardiomediastinal silhouette is stable.  No acute infiltrate or pulmonary edema.  Bony thorax is unremarkable.  IMPRESSION: No active disease.   Original Report Authenticated By: Lahoma Crocker, M.D.      No diagnosis found.    MDM  Patient care discussed with Dr. Arnoldo Morale and he will admit patient.  Patient remained stable here.  She voices understanding of plan.   I personally performed the services described in this documentation, which was scribed in my presence. The recorded information has been reviewed and considered.        Cassandra Pollack, MD 09/20/12 [phone]

## 2012-09-15 NOTE — ED Notes (Signed)
Nurse Anesthetist at bedside.

## 2012-09-15 NOTE — ED Notes (Signed)
Patient states she does not need anything at this time. 

## 2012-09-15 NOTE — Transfer of Care (Signed)
Immediate Anesthesia Transfer of Care Note  Patient: Cassandra Adams  Procedure(s) Performed: Procedure(s) (LRB): APPENDECTOMY LAPAROSCOPIC (N/A)  Patient Location: PACU  Anesthesia Type: General  Level of Consciousness: awake  Airway & Oxygen Therapy: Patient Spontanous Breathing and non-rebreather face mask  Post-op Assessment: Report given to PACU RN, Post -op Vital signs reviewed and stable and Patient moving all extremities  Post vital signs: Reviewed and stable  Complications: No apparent anesthesia complications

## 2012-09-15 NOTE — Anesthesia Postprocedure Evaluation (Addendum)
Anesthesia Post Note  Patient: Cassandra Adams  Procedure(s) Performed: Procedure(s) (LRB): APPENDECTOMY LAPAROSCOPIC (N/A)  Anesthesia type: General  Patient location: PACU  Post pain: Pain level controlled  Post assessment: Post-op Vital signs reviewed, Patient's Cardiovascular Status Stable, Respiratory Function Stable, Patent Airway, No signs of Nausea or vomiting and Pain level controlled  Last Vitals:  Filed Vitals:   09/15/12 1530  BP: 142/52  Pulse: 64  Temp:   Resp: 18    Post vital signs: Reviewed and stable  Level of consciousness: awake and alert   Complications: No apparent anesthesia complications  Temp. [id number] 09/16/12  Patient discharged this am.

## 2012-09-15 NOTE — Op Note (Signed)
Patient:  Cassandra Adams  DOB:  11-Feb-1946  MRN:  [id number]   Preop Diagnosis:  Acute appendicitis  Postop Diagnosis:  Same  Procedure:  Laparoscopic appendectomy  Surgeon:  Aviva Signs, M.D.  Anes:  General endotracheal  Indications:  Patient is a 67 year old white female presents with a less than 24-hour history of worsening right lower quadrant abdominal pain. CT scan the abdomen reveals acute appendicitis. The risks and benefits of the procedure including bleeding, infection, and the possibility of an open procedure were fully explained to the patient, who gave informed consent.  Procedure note:  The patient was placed in the supine position. After induction of general endotracheal anesthesia, the abdomen was prepped and draped using usual sterile technique with DuraPrep. Surgical site confirmation was performed.  A supraumbilical incision was made down to the fascia. A Veress needle was introduced into the abdominal cavity and confirmation of placement was done using the saline drop test. The abdomen was then insufflated to 16 mm mercury pressure. An 11 mm trocar was introduced into the abdominal cavity under direct visualization without difficulty. The patient is placed in deeper Trendelenburg position and additional 12 mm trocar was placed the suprapubic region and a 5 mm trocar was placed in the left lower quadrant region. The appendix was visualized and noted to be acutely inflamed. There was no evidence of perforation. The mesoappendix was divided using the LigaSure. A standard Endo GIA was placed across the base the appendix and fired. The appendix was removed using an Endo Catch bag without difficulty. He was sent to pathology further examination. The staple line was inspected and any bleeding was controlled using Bovie electrocautery. All fluid and air were then evacuated from the abdominal cavity prior to removal of the trochars.  All wounds were irrigated with normal  saline. All wounds were injected with 0.5% Sensorcaine. The suprabuccal fashion as well suprapubic fascia were reapproximated using 0 Vicryl interrupted sutures. All skin incisions were closed using staples. Betadine ointment and dressed a dressings were applied.  All tape and needle counts were correct at the end of the procedure. Patient was extubated in the operating room and transferred to PACU in stable condition.  Complications:  None  EBL:  Minimal  Specimen:  Appendix

## 2012-09-15 NOTE — ED Notes (Signed)
Pt finished both of her contrast drinks.

## 2012-09-15 NOTE — H&P (Signed)
Cassandra Adams is an 67 y.o. female.   Chief Complaint: Right lower quadrant abdominal pain HPI: Patient is a 67 year old black female who woke up approximately 10 hours ago with worsening lower abdominal pain. She did feel nauseated. Her appetite was decreased. She did not take her medications this morning. She presented emergency room and a CT scan of the abdomen and pelvis revealed acute appendicitis without perforation. She states she had her appendix frozen in the remote past.  Past Medical History  Diagnosis Date  . Diabetes mellitus   . Hypertension   . Stroke     tia  . Renal disorder     Past Surgical History  Procedure Laterality Date  . Replacement total knee  05-03-06  . Tonsillectomy  1972  . Esophagogastroduodenoscopy  11/08/2007     Normal esophagus without evidence of Barrett, mass, erosion/ Normal stomach, duodenal bulb  . Gastric emptying study  11/13/2007    mildly delayed emptying subjectively, but normal  analysis-77% of tracer emptied at 2 hours  . Appendectomy      had appendix frozen    Family History  Problem Relation Age of Onset  . Hyperlipidemia Mother   . Hypertension Mother   . Cancer Father     lung  . Hypertension Sister   . Diabetes Brother   . Heart disease Brother   . Hyperlipidemia Brother   . Hypertension Brother   . Heart attack Brother   . Other Brother     DVT   Social History:  reports that she has never smoked. She has never used smokeless tobacco. She reports that she does not drink alcohol or use illicit drugs.  Allergies:  Allergies  Allergen Reactions  . Penicillins      (Not in a hospital admission)  Results for orders placed during the hospital encounter of 09/15/12 (from the past 48 hour(s))  CBC WITH DIFFERENTIAL     Status: Abnormal   Collection Time    09/15/12  8:50 AM      Result Value Range   WBC 8.6  4.0 - 10.5 K/uL   RBC 5.01  3.87 - 5.11 MIL/uL   Hemoglobin 12.9  12.0 - 15.0 g/dL   HCT 36.8  36.0  - 46.0 %   MCV 73.5 (*) 78.0 - 100.0 fL   MCH 25.7 (*) 26.0 - 34.0 pg   MCHC 35.1  30.0 - 36.0 g/dL   RDW 14.8  11.5 - 15.5 %   Platelets 192  150 - 400 K/uL   Neutrophils Relative 71  43 - 77 %   Lymphocytes Relative 17  12 - 46 %   Monocytes Relative 9  3 - 12 %   Eosinophils Relative 3  0 - 5 %   Basophils Relative 0  0 - 1 %   Neutro Abs 6.0  1.7 - 7.7 K/uL   Lymphs Abs 1.5  0.7 - 4.0 K/uL   Monocytes Absolute 0.8  0.1 - 1.0 K/uL   Eosinophils Absolute 0.3  0.0 - 0.7 K/uL   Basophils Absolute 0.0  0.0 - 0.1 K/uL   Smear Review LARGE PLATELETS PRESENT    COMPREHENSIVE METABOLIC PANEL     Status: Abnormal   Collection Time    09/15/12  8:50 AM      Result Value Range   Sodium 140  135 - 145 mEq/L   Potassium 3.9  3.5 - 5.1 mEq/L   Chloride 108  96 - 112 mEq/L  CO2 23  19 - 32 mEq/L   Glucose, Bld 98  70 - 99 mg/dL   BUN 28 (*) 6 - 23 mg/dL   Creatinine, Ser 1.10  0.50 - 1.10 mg/dL   Calcium 10.3  8.4 - 10.5 mg/dL   Total Protein 7.0  6.0 - 8.3 g/dL   Albumin 3.3 (*) 3.5 - 5.2 g/dL   AST 15  0 - 37 U/L   ALT 12  0 - 35 U/L   Alkaline Phosphatase 117  39 - 117 U/L   Total Bilirubin 0.2 (*) 0.3 - 1.2 mg/dL   GFR calc non Af Amer 51 (*) >90 mL/min   GFR calc Af Amer 59 (*) >90 mL/min   Comment:            The eGFR has been calculated     using the CKD EPI equation.     This calculation has not been     validated in all clinical     situations.     eGFR's persistently     <90 mL/min signify     possible Chronic Kidney Disease.  LIPASE, BLOOD     Status: Abnormal   Collection Time    09/15/12  8:50 AM      Result Value Range   Lipase 8 (*) 11 - 59 U/L  URINALYSIS, ROUTINE W REFLEX MICROSCOPIC     Status: Abnormal   Collection Time    09/15/12 10:10 AM      Result Value Range   Color, Urine YELLOW  YELLOW   APPearance CLEAR  CLEAR   Specific Gravity, Urine 1.025  1.005 - 1.030   pH 5.5  5.0 - 8.0   Glucose, UA NEGATIVE  NEGATIVE mg/dL   Hgb urine dipstick TRACE  (*) NEGATIVE   Bilirubin Urine NEGATIVE  NEGATIVE   Ketones, ur NEGATIVE  NEGATIVE mg/dL   Protein, ur NEGATIVE  NEGATIVE mg/dL   Urobilinogen, UA 0.2  0.0 - 1.0 mg/dL   Nitrite NEGATIVE  NEGATIVE   Leukocytes, UA TRACE (*) NEGATIVE  URINE MICROSCOPIC-ADD ON     Status: Abnormal   Collection Time    09/15/12 10:10 AM      Result Value Range   Squamous Epithelial / LPF FEW (*) RARE   WBC, UA 3-6  <3 WBC/hpf   RBC / HPF 0-2  <3 RBC/hpf   Ct Abdomen Pelvis Wo Contrast  09/15/2012  *RADIOLOGY REPORT*  Clinical Data: Right-sided Abdominal pain  CT ABDOMEN AND PELVIS WITHOUT CONTRAST  Technique:  Multidetector CT imaging of the abdomen and pelvis was performed following the standard protocol without intravenous contrast.  Comparison: 12/28/2000 by report only  Findings: Minimal dependent atelectasis in the visualized lung bases.  Unremarkable uninfused evaluation of the liver, gallbladder, spleen, kidneys, pancreas.  No nephrolithiasis or hydronephrosis.  Scattered coarse aortoiliac atheromatous calcifications without aneurysm.  Stomach physiologically distended.  Small bowel and colon nondilated.  The appendix is distended up to 17 mm diameter, containing scattered gas bubbles and high-density material, with some mild wall thickening.  Minimal adjacent inflammatory/edematous change. No extraluminal gas or fluid.  Coarse calcifications in the uterus probably degenerated fibroids. Urinary bladder incompletely distended.  No ascites.  No free air. No adenopathy.  Degenerative changes in the lower thoracic and lumbar spine with grade 1 anterolisthesis L3-4 probably secondary to advanced facet degenerative changes at this level.  IMPRESSION: 1.  Mild appendicitis without evidence of perforation or abscess.   Original Report Authenticated  By: D. Wallace Going, MD    Dg Chest 2 View  09/15/2012  *RADIOLOGY REPORT*  Clinical Data: Preop, abdominal pain  CHEST - 2 VIEW  Comparison: 09/15/2012 and 07/30/2009   Findings: Cardiomediastinal silhouette is stable.  No acute infiltrate or pulmonary edema.  Bony thorax is unremarkable.  IMPRESSION: No active disease.   Original Report Authenticated By: Lahoma Crocker, M.D.     Review of Systems  Constitutional: Positive for fever and malaise/fatigue.  Eyes: Negative.   Respiratory: Negative.   Cardiovascular: Negative.   Gastrointestinal: Positive for nausea and abdominal pain.  Genitourinary: Positive for urgency.  Musculoskeletal: Positive for myalgias.  Skin: Negative.   Neurological: Negative.   Endo/Heme/Allergies: Negative.     Blood pressure 132/49, pulse 58, temperature 100.5 F (38.1 C), temperature source Oral, resp. rate 17, height 5\' 6"  (1.676 m), weight 93.441 kg (206 lb), SpO2 92.00%. Physical Exam  Constitutional: She is oriented to person, place, and time. She appears well-developed.  HENT:  Head: Normocephalic and atraumatic.  Neck: Normal range of motion. Neck supple.  Cardiovascular: Normal rate, regular rhythm and normal heart sounds.   Respiratory: Effort normal and breath sounds normal.  GI: Soft. Bowel sounds are normal. There is tenderness.  Tenderness in right lower quadrant to deep palpation. No rigidity noted.  Neurological: She is alert and oriented to person, place, and time.  Skin: Skin is warm and dry.  Psychiatric: She has a normal mood and affect. Her behavior is normal. Thought content normal.     Assessment/Plan Impression: Acute appendicitis Plan: The patient will be taken to the operating room for laparoscopic appendectomy. The risks and benefits of the procedure including bleeding, infection, and the possibility of an open procedure were fully explained to the patient, who gave informed consent.  Jenina Moening A 09/15/2012, 12:16 PM

## 2012-09-15 NOTE — Anesthesia Procedure Notes (Signed)
Procedure Name: Intubation Date/Time: 09/15/2012 2:05 PM Performed by: Vista Deck Pre-anesthesia Checklist: Patient identified, Patient being monitored, Timeout performed, Emergency Drugs available and Suction available Patient Re-evaluated:Patient Re-evaluated prior to inductionOxygen Delivery Method: Circle System Utilized Preoxygenation: Pre-oxygenation with 100% oxygen Intubation Type: IV induction, Rapid sequence and Cricoid Pressure applied Laryngoscope Size: Miller and 2 Grade View: Grade I Tube type: Oral Tube size: 7.0 mm Number of attempts: 1 Airway Equipment and Method: stylet Placement Confirmation: ETT inserted through vocal cords under direct vision,  positive ETCO2 and breath sounds checked- equal and bilateral Secured at: 21 cm Tube secured with: Tape Dental Injury: Teeth and Oropharynx as per pre-operative assessment

## 2012-09-15 NOTE — ED Notes (Signed)
Patient c/o nausea. Dr Jeanell Sparrow aware. Verbal order for 4 mg Zofran obtained.

## 2012-09-15 NOTE — ED Notes (Signed)
AC called for SCDs

## 2012-09-15 NOTE — ED Notes (Signed)
EMS reports pt woke up with r lower quad pain around 3am.  Also reports n/v.  Denies diarrhea, LBM was yesterday.   EMS reports low grade temp of 99.1 but reports house was very hot.

## 2012-09-16 LAB — CBC
MCH: 25.7 pg — ABNORMAL LOW (ref 26.0–34.0)
MCV: 73.5 fL — ABNORMAL LOW (ref 78.0–100.0)
Platelets: 165 10*3/uL (ref 150–400)
RBC: 4.6 MIL/uL (ref 3.87–5.11)
RDW: 14.9 % (ref 11.5–15.5)
WBC: 8.4 10*3/uL (ref 4.0–10.5)

## 2012-09-16 LAB — BASIC METABOLIC PANEL
Calcium: 9.6 mg/dL (ref 8.4–10.5)
Chloride: 109 mEq/L (ref 96–112)
Creatinine, Ser: 1.28 mg/dL — ABNORMAL HIGH (ref 0.50–1.10)
GFR calc Af Amer: 49 mL/min — ABNORMAL LOW (ref >90)
Sodium: 141 mEq/L (ref 135–145)

## 2012-09-16 MED ORDER — HYDROCODONE-ACETAMINOPHEN 5-325 MG PO TABS: 1.0000 | ORAL_TABLET | Freq: Four times a day (QID) | ORAL | Status: DC | PRN

## 2012-09-16 NOTE — Progress Notes (Signed)
UR Chart Review Completed  

## 2012-09-16 NOTE — Progress Notes (Signed)
Inpatient Diabetes Program Recommendations  AACE/ADA: New Consensus Statement on Inpatient Glycemic Control (2013)  Target Ranges:  Prepandial:   less than 140 mg/dL      Peak postprandial:   less than 180 mg/dL (1-2 hours)      Critically ill patients:  140 - 180 mg/dL   Results for KEIANA, LUEDKE (MRN [id number]) as of 09/16/2012 09:22  Ref. Range 09/15/2012 15:06 09/15/2012 15:31 09/15/2012 16:57 09/15/2012 21:31 09/16/2012 08:07 09/16/2012 08:45  Glucose-Capillary Latest Range: 70-99 mg/dL 69 (L) 114 (H) 99 145 (H) 42 (LL) 113 (H)    Inpatient Diabetes Program Recommendations Insulin - Basal: Please consider decreasing Lantus to 20 units QHS. Correction (SSI): May want to consider decreasing Novolog correction insulin to moderate correction scale.  Note: Patient has a history of diabetes and takes Lantus 27 units QHS and Novolog sliding scale TID at home for diabetes management.  Currently, patient is ordered to receive Lantus 27 units QHS and Novolog resistant correction AC for inpatient glycemic control. Patient noted to have blood glucose of 69 mg/dl yesterday at 15:06 and fasting blood glucose this morning was 42 mg/dl.  Please consider decreasing Lantus to 20 units QHS and decrease Novolog correction to moderate correction scale until patient is eating well and blood glucose is sustained without hypoglycemic episodes.  Will continue to follow.  Thanks, Barnie Alderman, RN, BSN, [place] [facility] Coordinator Inpatient Diabetes Program [phone]

## 2012-09-16 NOTE — Progress Notes (Signed)
Patient discharged home today per Dr. Arnoldo Morale. Pt' IV site d/c'd and WNL. Pt's VS stable at this time. Pt provided with home medication list, discharge instructions and prescriptions. Verbalized understanding. Pt educated on post-op surgical site care. Verbalized understanding. Pt left floor via WC in stable condition accompanied by NT.

## 2012-09-16 NOTE — Addendum Note (Signed)
Addendum created 09/16/12 1449 by Ollen Bowl, CRNA   Modules edited: Notes Section   Notes Section:  File: [id number]

## 2012-09-16 NOTE — Discharge Summary (Signed)
Physician Discharge Summary  Patient ID: Cassandra Adams MRN: [id number] DOB/AGE: 1946-03-31 67 y.o.  Admit date: 09/15/2012 Discharge date: 09/16/2012  Admission Diagnoses: Acute appendicitis  Discharge Diagnoses: Acute appendicitis Active Problems:   * No active hospital problems. *   Discharged Condition: good  Hospital Course: Patient is a 67 year old black female who presented emergency room with right lower quadrant abdominal pain, generalized malaise, and fever. She is on CT scan the abdomen to have acute appendicitis. Surgery was consulted and the patient was taken to the operating room on 09/15/2012 and underwent a laparoscopic appendectomy. She tolerated the procedure well. Her postoperative course was unremarkable. Her diet was advanced without difficulty.  The patient is being discharged home on 09/16/2012 in good and improving condition.  Treatments: surgery: Laparoscopic appendectomy on 09/15/2012  Discharge Exam: Blood pressure 167/55, pulse 55, temperature 98.3 F (36.8 C), temperature source Oral, resp. rate 18, height 5\' 6"  (1.676 m), weight 93.441 kg (206 lb), SpO2 98.00%. General appearance: alert, cooperative and no distress Resp: clear to auscultation bilaterally Cardio: regular rate and rhythm, S1, S2 normal, no murmur, click, rub or gallop GI: soft, non-tender; bowel sounds normal; no masses,  no organomegaly and Dressings dry and intact.  Disposition: 01-Home or Self Care   Future Appointments Provider Department Dept Phone   11/15/2012 2:30 PM [facility] [phone]   11/15/2012 3:40 PM Princess Perna, NP [facility] [phone]       Medication List    TAKE these medications       amLODipine 10 MG tablet  Commonly known as:  NORVASC  Take 10 mg by mouth daily.     aspirin 81 MG tablet  Take 81 mg by mouth daily.     atenolol 25 MG tablet  Commonly known as:   TENORMIN  Take 25 mg by mouth daily.     cloNIDine 0.1 MG tablet  Commonly known as:  CATAPRES  Take 0.1 mg by mouth 3 (three) times daily.     colesevelam 625 MG tablet  Commonly known as:  WELCHOL  Take 1,875 mg by mouth 2 (two) times daily with a meal.     furosemide 40 MG tablet  Commonly known as:  LASIX  Take 40 mg by mouth daily.     HYDROcodone-acetaminophen 5-325 MG per tablet  Commonly known as:  NORCO  Take 1-2 tablets by mouth every 6 (six) hours as needed for pain.     insulin aspart 100 UNIT/ML injection  Commonly known as:  novoLOG  Inject into the skin 3 (three) times daily before meals. Units vary     insulin glargine 100 UNIT/ML injection  Commonly known as:  LANTUS  Inject 27 Units into the skin at bedtime.     lisinopril-hydrochlorothiazide 20-25 MG per tablet  Commonly known as:  PRINZIDE,ZESTORETIC  Take 1 tablet by mouth daily.     Vitamin D-3 1000 UNITS Caps  Take 1 capsule by mouth daily.           Follow-up Information   Follow up with [name], MD. Schedule an appointment as soon as possible for a visit on 09/26/2012.   Contact information:   [address] [phone]       Signed: Aviva Signs A 09/16/2012, 9:38 AM

## 2012-09-17 ENCOUNTER — Encounter (HOSPITAL_COMMUNITY): Payer: Self-pay | Admitting: General Surgery

## 2012-10-17 ENCOUNTER — Emergency Department (HOSPITAL_COMMUNITY)
Admission: EM | Admit: 2012-10-17 | Discharge: 2012-10-17 | Disposition: A | Discharge status: Home / Self Care | Payer: Medicare Other | Attending: Emergency Medicine | Admitting: Emergency Medicine

## 2012-10-17 ENCOUNTER — Encounter (HOSPITAL_COMMUNITY): Payer: Self-pay | Admitting: *Deleted

## 2012-10-17 DIAGNOSIS — E119 Type 2 diabetes mellitus without complications: Secondary | ICD-10-CM | POA: Insufficient documentation

## 2012-10-17 DIAGNOSIS — Y929 Unspecified place or not applicable: Secondary | ICD-10-CM | POA: Insufficient documentation

## 2012-10-17 DIAGNOSIS — W19XXXA Unspecified fall, initial encounter: Secondary | ICD-10-CM | POA: Insufficient documentation

## 2012-10-17 DIAGNOSIS — G43909 Migraine, unspecified, not intractable, without status migrainosus: Secondary | ICD-10-CM | POA: Insufficient documentation

## 2012-10-17 DIAGNOSIS — R42 Dizziness and giddiness: Secondary | ICD-10-CM | POA: Insufficient documentation

## 2012-10-17 DIAGNOSIS — R55 Syncope and collapse: Secondary | ICD-10-CM | POA: Insufficient documentation

## 2012-10-17 DIAGNOSIS — R109 Unspecified abdominal pain: Secondary | ICD-10-CM | POA: Insufficient documentation

## 2012-10-17 DIAGNOSIS — I10 Essential (primary) hypertension: Secondary | ICD-10-CM | POA: Insufficient documentation

## 2012-10-17 DIAGNOSIS — Z8673 Personal history of transient ischemic attack (TIA), and cerebral infarction without residual deficits: Secondary | ICD-10-CM | POA: Insufficient documentation

## 2012-10-17 DIAGNOSIS — Y939 Activity, unspecified: Secondary | ICD-10-CM | POA: Insufficient documentation

## 2012-10-17 DIAGNOSIS — Z880 Allergy status to penicillin: Secondary | ICD-10-CM | POA: Insufficient documentation

## 2012-10-17 DIAGNOSIS — Z79899 Other long term (current) drug therapy: Secondary | ICD-10-CM | POA: Insufficient documentation

## 2012-10-17 DIAGNOSIS — Z794 Long term (current) use of insulin: Secondary | ICD-10-CM | POA: Insufficient documentation

## 2012-10-17 DIAGNOSIS — Z87448 Personal history of other diseases of urinary system: Secondary | ICD-10-CM | POA: Insufficient documentation

## 2012-10-17 DIAGNOSIS — H53149 Visual discomfort, unspecified: Secondary | ICD-10-CM | POA: Insufficient documentation

## 2012-10-17 DIAGNOSIS — Z7982 Long term (current) use of aspirin: Secondary | ICD-10-CM | POA: Insufficient documentation

## 2012-10-17 LAB — GLUCOSE, CAPILLARY: Glucose-Capillary: 196 mg/dL — ABNORMAL HIGH (ref 70–99)

## 2012-10-17 MED ORDER — DIPHENHYDRAMINE HCL 50 MG/ML IJ SOLN
25.0000 mg | Freq: Once | INTRAMUSCULAR | Status: DC
  Filled 2012-10-17: qty 1

## 2012-10-17 MED ORDER — METOCLOPRAMIDE HCL 5 MG/ML IJ SOLN
10.0000 mg | Freq: Once | INTRAMUSCULAR | Status: DC
  Filled 2012-10-17: qty 2

## 2012-10-17 MED ORDER — DIPHENHYDRAMINE HCL 50 MG/ML IJ SOLN
25.0000 mg | Freq: Once | INTRAMUSCULAR | Status: AC
  Administered 2012-10-17: 25 mg via INTRAMUSCULAR

## 2012-10-17 MED ORDER — METOCLOPRAMIDE HCL 5 MG/ML IJ SOLN
10.0000 mg | Freq: Once | INTRAMUSCULAR | Status: AC
  Administered 2012-10-17: 10 mg via INTRAMUSCULAR

## 2012-10-17 MED ORDER — SODIUM CHLORIDE 0.9 % IV SOLN: 1000.0000 mL | INTRAVENOUS | Status: DC

## 2012-10-17 MED ORDER — AMITRIPTYLINE HCL 25 MG PO TABS: ORAL_TABLET | ORAL | Status: DC

## 2012-10-17 MED ORDER — SODIUM CHLORIDE 0.9 % IV SOLN: 1000.0000 mL | Freq: Once | INTRAVENOUS | Status: DC

## 2012-10-17 NOTE — ED Notes (Signed)
Patient refused IV after two attempts by two separate nurses ended in the vein blowing. Patient states that she is "always a hard stick. I have those rolling veins and never let anyone stick me more than twice." Reason for IV stick order explained to patient, disadvantage of not starting an IV access explained to patient. Patient adamantly refused any more attempts to start an IV access by a nurse considered to be an expert at IV starts or by any other personnel. MD made aware, orders changed.

## 2012-10-17 NOTE — ED Notes (Signed)
Headache, dizzy, fell today "jarred me".Had appendectomy  March 23.

## 2012-10-17 NOTE — ED Notes (Signed)
Offered pt something to drink, will take a diet coke. cbg ordered and done and pt given diet coke.

## 2012-10-17 NOTE — ED Provider Notes (Signed)
History    This chart was scribed for Cassandra Norrie, MD by Cassandra Adams, ED Scribe. The patient was seen in room APA19/[facility]. Patient's care was started at 6:05 PM.    CSN: [id number]  Arrival date & time 10/17/12  1709   First MD Initiated Contact with Patient 10/17/12 1728      Chief Complaint  Patient presents with  . Fall  . Near Syncope  . Abdominal Pain  . Headache    (Consider location/radiation/quality/duration/timing/severity/associated sxs/prior treatment) Patient is a 67 y.o. female presenting with fall. The history is provided by the patient and a relative. No language interpreter was used.  Fall She was ambulatory at the scene. There was no entrapment after the fall. There was no drug use involved in the accident. There was no alcohol use involved in the accident. Associated symptoms include headaches.   Cassandra Adams is a 67 y.o. female with h/o DM, HTN, and stroke who presents to the Emergency Department complaining of moderate constant headache  onset for weeks. She reports having headaches since she was a child. She has never seen a neurologist or taken preventative medications. She states today  she fell due to the lightheadedness she was experiencing. She denies any injury or LOC.  Pt states that she has had headaches with episodes of emesis for weeks now. She states the frontal part of forehead is the focil point where the pain is. She states it is a pounding sensation and light and sound seem to exacerbate the pain. She states she has had 3 episodes of emesis today. She states her vision becomes blurry at times due to her DM but is not changed with her headache. Pt denies LOC,  fever, chills, cough,  diarrhea, SOB, and any other associated symptoms. She denies numbness or weakness in her extremities.   Pt is currently retired and lives by herself but her daughter lives next door.  Pt states she sees Dr. Gerarda Fraction, her family doctor,  but denies seeing any neurologist.     Pt had an appendectomy September 15, 2012.  Past Medical History  Diagnosis Date  . Diabetes mellitus   . Hypertension   . Stroke     tia  . Renal disorder     Past Surgical History  Procedure Laterality Date  . Replacement total knee  05-03-06  . Tonsillectomy  1972  . Esophagogastroduodenoscopy  11/08/2007     Normal esophagus without evidence of Barrett, mass, erosion/ Normal stomach, duodenal bulb  . Gastric emptying study  11/13/2007    mildly delayed emptying subjectively, but normal  analysis-77% of tracer emptied at 2 hours  . Appendectomy      had appendix frozen  . Laparoscopic appendectomy N/A 09/15/2012    Procedure: APPENDECTOMY LAPAROSCOPIC;  Surgeon: [name], MD;  Location: [facility];  Service: General;  Laterality: N/A;    Family History  Problem Relation Age of Onset  . Hyperlipidemia Mother   . Hypertension Mother   . Cancer Father     lung  . Hypertension Sister   . Diabetes Brother   . Heart disease Brother   . Hyperlipidemia Brother   . Hypertension Brother   . Heart attack Brother   . Other Brother     DVT    History  Substance Use Topics  . Smoking status: Never Smoker   . Smokeless tobacco: Never Used  . Alcohol Use: No  Lives at home Lives alone Daughter lives next door  retired  OB History   Grav Para Term Preterm Abortions TAB SAB Ect Mult Living                  Review of Systems  Neurological: Positive for light-headedness and headaches.  All other systems reviewed and are negative.    Allergies  Penicillins  Home Medications   Current Outpatient Rx  Name  Route  Sig  Dispense  Refill  . amLODipine (NORVASC) 10 MG tablet   Oral   Take 10 mg by mouth daily.         [name] aspirin 81 MG tablet   Oral   Take 81 mg by mouth every morning.          [name] atenolol (TENORMIN) 25 MG tablet   Oral   Take 25 mg by mouth daily.         . Cholecalciferol (VITAMIN D-3) 1000 UNITS CAPS   Oral   Take 1 capsule by mouth  every morning.          . cloNIDine (CATAPRES) 0.1 MG tablet   Oral   Take 0.1 mg by mouth 3 (three) times daily.         . colesevelam (WELCHOL) 625 MG tablet   Oral   Take 1,875 mg by mouth 2 (two) times daily with a meal. *May take 6 tablets once a day with meals*         . furosemide (LASIX) 40 MG tablet   Oral   Take 40 mg by mouth daily.         . insulin aspart (NOVOLOG) 100 UNIT/ML injection   Subcutaneous   Inject into the skin 3 (three) times daily before meals. Units vary         . insulin glargine (LANTUS) 100 UNIT/ML injection   Subcutaneous   Inject 27 Units into the skin at bedtime.         [name] lisinopril-hydrochlorothiazide (PRINZIDE,ZESTORETIC) 20-25 MG per tablet   Oral   Take 1 tablet by mouth daily.           BP 172/59  Pulse 50  Temp(Src) 97.9 F (36.6 C) (Oral)  Resp 18  Ht 5\' 7"  (1.702 m)  Wt 210 lb (95.255 kg)  BMI 32.88 kg/m2  SpO2 100%  Vital signs normal except hypertension and bradycardia   Physical Exam  Nursing note and vitals reviewed. Constitutional: She is oriented to person, place, and time. She appears well-developed and well-nourished.  Non-toxic appearance. She does not appear ill. No distress.  HENT:  Head: Normocephalic and atraumatic.  Right Ear: External ear normal.  Left Ear: External ear normal.  Nose: Nose normal. No mucosal edema or rhinorrhea.  Mouth/Throat: Mucous membranes are not dry. No dental abscesses or edematous.  Eyes: Conjunctivae and EOM are normal. Pupils are equal, round, and reactive to light.  + photophobia  Neck: Normal range of motion and full passive range of motion without pain. Neck supple.  Cardiovascular: Normal rate, regular rhythm and normal heart sounds.  Exam reveals no gallop and no friction rub.   No murmur heard. Pulmonary/Chest: Effort normal and breath sounds normal. No respiratory distress. She has no wheezes. She has no rhonchi. She has no rales. She exhibits no tenderness  and no crepitus.  Abdominal: Soft. Normal appearance and bowel sounds are normal. She exhibits no distension. There is no tenderness. There is no rebound and no guarding.  Musculoskeletal: Normal range of motion. She exhibits no  edema and no tenderness.  Moves all extremities well. No abrasions, contusions seen  Neurological: She is alert and oriented to person, place, and time. She has normal strength. No cranial nerve deficit.  Skin: Skin is warm, dry and intact. No rash noted. No erythema. No pallor.  Psychiatric: She has a normal mood and affect. Her speech is normal and behavior is normal. Her mood appears not anxious.    ED Course  Procedures (including critical care time)  Medications  metoCLOPramide (REGLAN) injection 10 mg (10 mg Intramuscular Given 10/17/12 1850)  diphenhydrAMINE (BENADRYL) injection 25 mg (25 mg Intramuscular Given 10/17/12 1848)    DIAGNOSTIC STUDIES: Oxygen Saturation is 100% on room air, normal by my interpretation.    COORDINATION OF CARE: 6:10 PM Discussed ED treatment with pt and pt agrees.   18:45 nursing unable to get IV started after two attempts and patient refused further attempts.   8:20 PM Pt states her headache is about a 3/10 now since treatment. Feels ready to go home.  8:49 PM consulted with pt about medication.  Results for orders placed during the hospital encounter of 10/17/12  GLUCOSE, CAPILLARY      Result Value Range   Glucose-Capillary 196 (*) 70 - 99 mg/dL   Laboratory interpretation all normal hyperglycemia     1. Migraine headache     Discharge Medication List as of 10/17/2012  8:43 PM    START taking these medications   Details  amitriptyline (ELAVIL) 25 MG tablet Take 1 po at bedtime for 1 week then take 2 po at bedtime, Print        Plan discharge  Rolland Porter, MD, [place]   MDM   I personally performed the services described in this documentation, which was scribed in my presence. The recorded information  has been reviewed and considered.  Rolland Porter, MD, FACEP         Cassandra Norrie, MD 10/18/12 [phone]

## 2012-10-17 NOTE — ED Notes (Addendum)
Patient states that head was not hit during fall, no loss of consciousness. Patient reports that symptoms began with abdominal pain, followed by headache, dizziness, and nausea.

## 2012-11-15 ENCOUNTER — Ambulatory Visit: Payer: Medicare Other | Admitting: Neurosurgery

## 2012-11-15 ENCOUNTER — Other Ambulatory Visit (INDEPENDENT_AMBULATORY_CARE_PROVIDER_SITE_OTHER): Payer: Medicare Other | Admitting: *Deleted

## 2012-11-15 DIAGNOSIS — I6523 Occlusion and stenosis of bilateral carotid arteries: Secondary | ICD-10-CM

## 2012-11-15 DIAGNOSIS — I6529 Occlusion and stenosis of unspecified carotid artery: Secondary | ICD-10-CM

## 2012-11-19 ENCOUNTER — Other Ambulatory Visit: Payer: Self-pay | Admitting: *Deleted

## 2012-11-25 ENCOUNTER — Encounter: Payer: Self-pay | Admitting: Vascular Surgery

## 2012-12-16 ENCOUNTER — Encounter (HOSPITAL_COMMUNITY): Payer: Self-pay | Admitting: *Deleted

## 2012-12-16 ENCOUNTER — Emergency Department (HOSPITAL_COMMUNITY)
Admission: EM | Admit: 2012-12-16 | Discharge: 2012-12-16 | Disposition: A | Discharge status: Home / Self Care | Payer: Medicare Other | Attending: Emergency Medicine | Admitting: Emergency Medicine

## 2012-12-16 DIAGNOSIS — Z9181 History of falling: Secondary | ICD-10-CM | POA: Insufficient documentation

## 2012-12-16 DIAGNOSIS — Z880 Allergy status to penicillin: Secondary | ICD-10-CM | POA: Insufficient documentation

## 2012-12-16 DIAGNOSIS — M542 Cervicalgia: Secondary | ICD-10-CM | POA: Insufficient documentation

## 2012-12-16 DIAGNOSIS — Z794 Long term (current) use of insulin: Secondary | ICD-10-CM | POA: Insufficient documentation

## 2012-12-16 DIAGNOSIS — M25519 Pain in unspecified shoulder: Secondary | ICD-10-CM | POA: Insufficient documentation

## 2012-12-16 DIAGNOSIS — M25512 Pain in left shoulder: Secondary | ICD-10-CM

## 2012-12-16 DIAGNOSIS — E119 Type 2 diabetes mellitus without complications: Secondary | ICD-10-CM | POA: Insufficient documentation

## 2012-12-16 DIAGNOSIS — Z79899 Other long term (current) drug therapy: Secondary | ICD-10-CM | POA: Insufficient documentation

## 2012-12-16 DIAGNOSIS — I10 Essential (primary) hypertension: Secondary | ICD-10-CM | POA: Insufficient documentation

## 2012-12-16 DIAGNOSIS — Z8673 Personal history of transient ischemic attack (TIA), and cerebral infarction without residual deficits: Secondary | ICD-10-CM | POA: Insufficient documentation

## 2012-12-16 DIAGNOSIS — Z87448 Personal history of other diseases of urinary system: Secondary | ICD-10-CM | POA: Insufficient documentation

## 2012-12-16 DIAGNOSIS — Z7982 Long term (current) use of aspirin: Secondary | ICD-10-CM | POA: Insufficient documentation

## 2012-12-16 NOTE — ED Notes (Addendum)
Pain lt arm for 3 weeks, Seen by Dr Gerarda Fraction and given meds that have not helped.  Says she was told to come here for an MRI.  [place] 1 month ago an came here , says she had a normal x-ray. Increased pain with movement of elbow and neck

## 2012-12-16 NOTE — ED Notes (Signed)
MRI scheduled for 6pm WED, pt verbalized understanding

## 2012-12-16 NOTE — ED Provider Notes (Signed)
History    CSN: [id number] Arrival date & time 12/16/12  1538  First MD Initiated Contact with Patient 12/16/12 1711     Chief Complaint  Patient presents with  . Arm Pain   (Consider location/radiation/quality/duration/timing/severity/associated sxs/prior Treatment) Patient is a 67 y.o. female presenting with arm pain. The history is provided by the patient.  Arm Pain This is a new problem. Pertinent negatives include no chest pain, no abdominal pain, no headaches and no shortness of breath.   patient states she's had pain in her left arm for the last month. She states she was seen in the ER after fall. She states she's had continued pain. She states her primary care Dr. is giving her a shot in the butt and the pain continues. She states she called today and he told her to go immediately to the ER and get an MRI. When asked where she told me that the MRI she points from her shoulder to her wrist. No weakness. The pain is worse with movement. No numbness or weakness. She does have neck pain. Neck is worse with her looking to the right.  Past Medical History  Diagnosis Date  . Diabetes mellitus   . Hypertension   . Stroke     tia  . Renal disorder    Past Surgical History  Procedure Laterality Date  . Replacement total knee  05-03-06  . Tonsillectomy  1972  . Esophagogastroduodenoscopy  11/08/2007     Normal esophagus without evidence of Barrett, mass, erosion/ Normal stomach, duodenal bulb  . Gastric emptying study  11/13/2007    mildly delayed emptying subjectively, but normal  analysis-77% of tracer emptied at 2 hours  . Appendectomy      had appendix frozen  . Laparoscopic appendectomy N/A 09/15/2012    Procedure: APPENDECTOMY LAPAROSCOPIC;  Surgeon: [name], MD;  Location: [facility];  Service: General;  Laterality: N/A;   Family History  Problem Relation Age of Onset  . Hyperlipidemia Mother   . Hypertension Mother   . Cancer Father     lung  . Hypertension Sister    . Diabetes Brother   . Heart disease Brother   . Hyperlipidemia Brother   . Hypertension Brother   . Heart attack Brother   . Other Brother     DVT   History  Substance Use Topics  . Smoking status: Never Smoker   . Smokeless tobacco: Never Used  . Alcohol Use: No   OB History   Grav Para Term Preterm Abortions TAB SAB Ect Mult Living                 Review of Systems  Constitutional: Negative for activity change and appetite change.  HENT: Positive for neck pain. Negative for neck stiffness.   Eyes: Negative for pain.  Respiratory: Negative for chest tightness and shortness of breath.   Cardiovascular: Negative for chest pain and leg swelling.  Gastrointestinal: Negative for nausea, vomiting, abdominal pain and diarrhea.  Genitourinary: Negative for flank pain.  Musculoskeletal: Negative for back pain.       Shoulder pain  Skin: Negative for rash.  Neurological: Negative for weakness, numbness and headaches.  Psychiatric/Behavioral: Negative for behavioral problems.    Allergies  Penicillins  Home Medications   Current Outpatient Rx  Name  Route  Sig  Dispense  Refill  . amitriptyline (ELAVIL) 25 MG tablet      Take 1 po at bedtime for 1 week then take  2 po at bedtime   60 tablet   0   . amLODipine (NORVASC) 10 MG tablet   Oral   Take 10 mg by mouth daily.         [name] aspirin 81 MG tablet   Oral   Take 81 mg by mouth every morning.          [name] atenolol (TENORMIN) 25 MG tablet   Oral   Take 25 mg by mouth daily.         . Cholecalciferol (VITAMIN D-3) 1000 UNITS CAPS   Oral   Take 1 capsule by mouth every morning.          . cloNIDine (CATAPRES) 0.1 MG tablet   Oral   Take 0.1 mg by mouth 3 (three) times daily.         . colesevelam (WELCHOL) 625 MG tablet   Oral   Take 1,875 mg by mouth 2 (two) times daily with a meal. *May take 6 tablets once a day with meals*         . furosemide (LASIX) 40 MG tablet   Oral   Take 40 mg by mouth  daily.         . insulin aspart (NOVOLOG) 100 UNIT/ML injection   Subcutaneous   Inject into the skin 3 (three) times daily before meals. Units vary         . insulin glargine (LANTUS) 100 UNIT/ML injection   Subcutaneous   Inject 27 Units into the skin at bedtime.         [name] lisinopril-hydrochlorothiazide (PRINZIDE,ZESTORETIC) 20-25 MG per tablet   Oral   Take 1 tablet by mouth daily.          BP 160/56  Pulse 56  Temp(Src) 98.4 F (36.9 C) (Oral)  Resp 18  Ht 5\' 6"  (1.676 m)  Wt 200 lb (90.719 kg)  BMI 32.3 kg/m2  SpO2 99% Physical Exam  Constitutional: She is oriented to person, place, and time. She appears well-developed and well-nourished.  HENT:  Head: Normocephalic and atraumatic.  Eyes: Pupils are equal, round, and reactive to light.  Neck:  No midline cervical tenderness. Mild left posterior musculature tenderness. Range of motion intact.  Cardiovascular: Normal rate and regular rhythm.   Pulmonary/Chest: Effort normal and breath sounds normal.  Musculoskeletal:  Range of motion intact left shoulder. Patient states it hurts to move the arm. Patient states it hurts to touch her arm everywhere. Strength is intact. Sensation is intact.  Neurological: She is alert and oriented to person, place, and time.  Skin: Skin is warm. No rash noted.    ED Course  Procedures (including critical care time) Labs Reviewed - No data to display No results found. No diagnosis found.  MDM  Patient with fall of month ago. She states she's had pain since. She states it is in her entire arm. Pain appears to localize more to left shoulder and left neck. Patient states her PCP Center in for an MRI. I do not think she needs an emergent MRI, however I ordered one for tomorrow. She should be called to be scheduled.  Jasper Riling. Alvino Chapel, MD 12/16/12 1843

## 2012-12-16 NOTE — ED Notes (Signed)
Discharge instructions reviewed with pt, questions answered. Pt verbalized understanding.  

## 2012-12-16 NOTE — ED Notes (Signed)
Pain in left elbow for the past 3 weeks, hurts to move elbow

## 2012-12-18 ENCOUNTER — Ambulatory Visit (HOSPITAL_COMMUNITY): Payer: Medicare Other

## 2013-08-12 ENCOUNTER — Other Ambulatory Visit: Payer: Self-pay | Admitting: Vascular Surgery

## 2013-08-12 DIAGNOSIS — I6529 Occlusion and stenosis of unspecified carotid artery: Secondary | ICD-10-CM

## 2013-10-12 ENCOUNTER — Encounter (HOSPITAL_COMMUNITY): Payer: Self-pay | Admitting: Emergency Medicine

## 2013-10-12 ENCOUNTER — Emergency Department (HOSPITAL_COMMUNITY): Payer: Medicare Other

## 2013-10-12 ENCOUNTER — Emergency Department (HOSPITAL_COMMUNITY)
Admission: EM | Admit: 2013-10-12 | Discharge: 2013-10-12 | Disposition: A | Discharge status: Home / Self Care | Payer: Medicare Other | Attending: Emergency Medicine | Admitting: Emergency Medicine

## 2013-10-12 DIAGNOSIS — Z8673 Personal history of transient ischemic attack (TIA), and cerebral infarction without residual deficits: Secondary | ICD-10-CM | POA: Insufficient documentation

## 2013-10-12 DIAGNOSIS — B9789 Other viral agents as the cause of diseases classified elsewhere: Secondary | ICD-10-CM | POA: Insufficient documentation

## 2013-10-12 DIAGNOSIS — Z7982 Long term (current) use of aspirin: Secondary | ICD-10-CM | POA: Insufficient documentation

## 2013-10-12 DIAGNOSIS — J9801 Acute bronchospasm: Secondary | ICD-10-CM | POA: Insufficient documentation

## 2013-10-12 DIAGNOSIS — Z79899 Other long term (current) drug therapy: Secondary | ICD-10-CM | POA: Insufficient documentation

## 2013-10-12 DIAGNOSIS — B349 Viral infection, unspecified: Secondary | ICD-10-CM

## 2013-10-12 DIAGNOSIS — Z794 Long term (current) use of insulin: Secondary | ICD-10-CM | POA: Insufficient documentation

## 2013-10-12 DIAGNOSIS — R51 Headache: Secondary | ICD-10-CM | POA: Insufficient documentation

## 2013-10-12 DIAGNOSIS — I10 Essential (primary) hypertension: Secondary | ICD-10-CM | POA: Insufficient documentation

## 2013-10-12 DIAGNOSIS — Z880 Allergy status to penicillin: Secondary | ICD-10-CM | POA: Insufficient documentation

## 2013-10-12 DIAGNOSIS — E119 Type 2 diabetes mellitus without complications: Secondary | ICD-10-CM | POA: Insufficient documentation

## 2013-10-12 DIAGNOSIS — Z87448 Personal history of other diseases of urinary system: Secondary | ICD-10-CM | POA: Insufficient documentation

## 2013-10-12 LAB — CBG MONITORING, ED: Glucose-Capillary: 102 mg/dL — ABNORMAL HIGH (ref 70–99)

## 2013-10-12 MED ORDER — PREDNISONE 50 MG PO TABS
60.0000 mg | ORAL_TABLET | Freq: Once | ORAL | Status: AC
  Administered 2013-10-12: 60 mg via ORAL
  Filled 2013-10-12 (×2): qty 1

## 2013-10-12 MED ORDER — PREDNISONE 20 MG PO TABS: ORAL_TABLET | ORAL | Status: DC

## 2013-10-12 MED ORDER — ALBUTEROL SULFATE HFA 108 (90 BASE) MCG/ACT IN AERS
4.0000 | INHALATION_SPRAY | Freq: Once | RESPIRATORY_TRACT | Status: AC
  Administered 2013-10-12: 4 via RESPIRATORY_TRACT
  Filled 2013-10-12: qty 6.7

## 2013-10-12 NOTE — ED Notes (Signed)
Pt c/o dry non-productive cough for 2 weeks. States she also has headache and c/o soreness from coughing. Scattered wheezes heard RML and RLL.

## 2013-10-12 NOTE — Discharge Instructions (Signed)
Antibiotic Nonuse  Your caregiver felt that the infection or problem was not one that would be helped with an antibiotic. Infections may be caused by viruses or bacteria. Only a caregiver can tell which one of these is the likely cause of an illness. A cold is the most common cause of infection in both adults and children. A cold is a virus. Antibiotic treatment will have no effect on a viral infection. Viruses can lead to many lost days of work caring for sick children and many missed days of school. Children may catch as many as 10 "colds" or "flus" per year during which they can be tearful, cranky, and uncomfortable. The goal of treating a virus is aimed at keeping the ill person comfortable. Antibiotics are medications used to help the body fight bacterial infections. There are relatively few types of bacteria that cause infections but there are hundreds of viruses. While both viruses and bacteria cause infection they are very different types of germs. A viral infection will typically go away by itself within 7 to 10 days. Bacterial infections may spread or get worse without antibiotic treatment. Examples of bacterial infections are:  Sore throats (like strep throat or tonsillitis).  Infection in the lung (pneumonia).  Ear and skin infections. Examples of viral infections are:  Colds or flus.  Most coughs and bronchitis.  Sore throats not caused by Strep.  Runny noses. It is often best not to take an antibiotic when a viral infection is the cause of the problem. Antibiotics can kill off the helpful bacteria that we have inside our body and allow harmful bacteria to start growing. Antibiotics can cause side effects such as allergies, nausea, and diarrhea without helping to improve the symptoms of the viral infection. Additionally, repeated uses of antibiotics can cause bacteria inside of our body to become resistant. That resistance can be passed onto harmful bacterial. The next time you have  an infection it may be harder to treat if antibiotics are used when they are not needed. Not treating with antibiotics allows our own immune system to develop and take care of infections more efficiently. Also, antibiotics will work better for [place] when they are prescribed for bacterial infections. Treatments for a child that is ill may include:  Give extra fluids throughout the day to stay hydrated.  Get plenty of rest.  Only give your child over-the-counter or prescription medicines for pain, discomfort, or fever as directed by your caregiver.  The use of a cool mist humidifier may help stuffy noses.  Cold medications if suggested by your caregiver. Your caregiver may decide to start you on an antibiotic if:  The problem you were seen for today continues for a longer length of time than expected.  You develop a secondary bacterial infection. SEEK MEDICAL CARE IF:  Fever lasts longer than 5 days.  Symptoms continue to get worse after 5 to 7 days or become severe.  Difficulty in breathing develops.  Signs of dehydration develop (poor drinking, rare urinating, dark colored urine).  Changes in behavior or worsening tiredness (listlessness or lethargy). Document Released: 08/21/2001 Document Revised: 09/04/2011 Document Reviewed: 02/17/2009 [facility] Patient Information 2014 [place], [place]. You appear to have an upper respiratory infection (URI). An upper respiratory tract infection, or cold, is a viral infection of the air passages leading to the lungs. It is contagious and can be spread to others, especially during the first 3 or 4 days. It cannot be cured by antibiotics or other medicines. RETURN IMMEDIATELY  IF you develop shortness of breath, confusion or altered mental status, a new rash, become dizzy, faint, or poorly responsive, or are unable to be cared for at home. You are having a headache. No specific cause was found today for your headache. It may have been a migraine or other  cause of headache. Stress, anxiety, fatigue, and depression are common triggers for headaches. Your headache today does not appear to be life-threatening or require hospitalization, but often the exact cause of headaches is not determined in the emergency department. Therefore, follow-up with your doctor is very important to find out what may have caused your headache, and whether or not you need any further diagnostic testing or treatment. Sometimes headaches can appear benign (not harmful), but then more serious symptoms can develop which should prompt an immediate re-evaluation by your doctor or the emergency department. SEEK MEDICAL ATTENTION IF: You develop possible problems with medications prescribed.  The medications don't resolve your headache, if it recurs , or if you have multiple episodes of vomiting or can't take fluids. You have a change from the usual headache. RETURN IMMEDIATELY IF you develop a sudden, severe headache or confusion, become poorly responsive or faint, develop a fever above 100.53F or problem breathing, have a change in speech, vision, swallowing, or understanding, or develop new weakness, numbness, tingling, incoordination, or have a seizure.

## 2013-10-12 NOTE — ED Notes (Signed)
Patient c/o headache/sinus pressure with dry cough x2 weeks. Per patient chest sore from coughing. Patient reports nausea from cough. Denies any vomiting or fevers.

## 2013-10-12 NOTE — ED Provider Notes (Signed)
CSN: [id number]     Arrival date & time 10/12/13  1753 History   First MD Initiated Contact with Patient 10/12/13 1811     Chief Complaint  Patient presents with  . Headache  . Cough     (Consider location/radiation/quality/duration/timing/severity/associated sxs/prior Treatment) HPI 68 year old female with diabetes her blood sugars running normally today complains of 2 weeks of a nonproductive cough with no fever no shortness of breath no wheezing but when she coughs she has some mild to moderate pressure in her for her head which is only mild and she is not coughing she has had some nasal congestion with runny nose for several days which is now much improved she has no sudden headache no severe headache no confusion no rash no stiff neck no abdominal pain no vomiting no diarrhea no confusion and no treatment prior to arrival.the pressure-like headache in her forehead it is not as severe like her prior migraine headaches. She is no change in speech vision swallowing or understanding no focal or lateralizing weakness numbness incoordination or vertigo. Past Medical History  Diagnosis Date  . Diabetes mellitus   . Hypertension   . Stroke     tia  . Renal disorder    Past Surgical History  Procedure Laterality Date  . Replacement total knee  05-03-06  . Tonsillectomy  1972  . Esophagogastroduodenoscopy  11/08/2007     Normal esophagus without evidence of Barrett, mass, erosion/ Normal stomach, duodenal bulb  . Gastric emptying study  11/13/2007    mildly delayed emptying subjectively, but normal  analysis-77% of tracer emptied at 2 hours  . Appendectomy      had appendix frozen  . Laparoscopic appendectomy N/A 09/15/2012    Procedure: APPENDECTOMY LAPAROSCOPIC;  Surgeon: [name], MD;  Location: [facility];  Service: General;  Laterality: N/A;   Family History  Problem Relation Age of Onset  . Hyperlipidemia Mother   . Hypertension Mother   . Cancer Father     lung  .  Hypertension Sister   . Diabetes Brother   . Heart disease Brother   . Hyperlipidemia Brother   . Hypertension Brother   . Heart attack Brother   . Other Brother     DVT   History  Substance Use Topics  . Smoking status: Never Smoker   . Smokeless tobacco: Never Used  . Alcohol Use: No   OB History   Grav Para Term Preterm Abortions TAB SAB Ect Mult Living   2 2 2       2      Review of Systems  10 Systems reviewed and are negative for acute change except as noted in the HPI.  Allergies  Penicillins  Home Medications   Prior to Admission medications   Medication Sig Start Date End Date Taking? Authorizing Provider  amitriptyline (ELAVIL) 25 MG tablet Take 25-50 mg by mouth every other day. Taken every other day at bedtime for headaches 10/17/12   [name], MD  amLODipine (NORVASC) 10 MG tablet Take 10 mg by mouth daily.    Historical Provider, MD  aspirin 81 MG tablet Take 81 mg by mouth every morning.     Historical Provider, MD  atenolol (TENORMIN) 25 MG tablet Take 25 mg by mouth daily.    Historical Provider, MD  Cholecalciferol (VITAMIN D-3) 1000 UNITS CAPS Take 1 capsule by mouth every morning.     Historical Provider, MD  cloNIDine (CATAPRES) 0.1 MG tablet Take 0.1 mg  by mouth 3 (three) times daily.    Historical Provider, MD  colesevelam (WELCHOL) 625 MG tablet Take 1,875 mg by mouth 2 (two) times daily with a meal. *May take 6 tablets once a day with meals*    Historical Provider, MD  furosemide (LASIX) 40 MG tablet Take 40 mg by mouth daily.    Historical Provider, MD  insulin aspart (NOVOLOG) 100 UNIT/ML injection Inject 5-11 Units into the skin 3 (three) times daily before meals. Units vary    Historical Provider, MD  insulin glargine (LANTUS) 100 UNIT/ML injection Inject 27 Units into the skin at bedtime.    Historical Provider, MD  lisinopril-hydrochlorothiazide (PRINZIDE,ZESTORETIC) 20-25 MG per tablet Take 1 tablet by mouth daily.    Historical Provider, MD    BP 158/69  Pulse 62  Temp(Src) 98 F (36.7 C) (Oral)  Resp 16  Ht 5\' 6"  (1.676 m)  Wt 200 lb (90.719 kg)  BMI 32.30 kg/m2  SpO2 98% Physical Exam  Nursing note and vitals reviewed. Constitutional:  Awake, alert, nontoxic appearance.  HENT:  Head: Atraumatic.  Eyes: Right eye exhibits no discharge. Left eye exhibits no discharge.  Neck: Neck supple.  Cardiovascular: Normal rate and regular rhythm.   No murmur heard. Pulmonary/Chest: Effort normal. No respiratory distress. She has wheezes. She has rales. She exhibits no tenderness.  Normal speech with pulse oximetry normal room air 100% no retractions no accessory muscle usage the patient does have faint scattered expiratory wheezes bilaterally with forced exhalation's in the right posterior lung base region has localized crackles  Abdominal: Soft. Bowel sounds are normal. She exhibits no distension and no mass. There is no tenderness. There is no rebound and no guarding.  Musculoskeletal: She exhibits no edema and no tenderness.  Baseline ROM, no obvious new focal weakness.  Neurological: She is alert.  Mental status and motor strength appears baseline for patient and situation.  Skin: No rash noted.  Psychiatric: She has a normal mood and affect.    ED Course  Procedures (including critical care time) Patient / Family / Caregiver informed of clinical course, understand medical decision-making process, and agree with plan. Labs Review Labs Reviewed  CBG MONITORING, ED - Abnormal; Notable for the following:    Glucose-Capillary 102 (*)    All other components within normal limits    Imaging Review Dg Chest 2 View  10/12/2013   CLINICAL DATA:  Cough and chest congestion.  EXAM: CHEST  2 VIEW  COMPARISON:  09/15/2012  FINDINGS: The heart size and mediastinal contours are within normal limits. Both lungs are clear. The visualized skeletal structures are unremarkable.  IMPRESSION: No active cardiopulmonary disease.    Electronically Signed   By: Earle Gell M.D.   On: 10/12/2013 19:16      MDM   Final diagnoses:  Acute bronchospasm due to viral infection    I doubt any other EMC precluding discharge at this time including, but not necessarily limited to the following:SAH, CVA, SBI.    Babette Relic, MD 10/13/13 [phone]

## 2013-10-12 NOTE — ED Notes (Signed)
Pt states she

## 2013-11-20 ENCOUNTER — Encounter: Payer: Self-pay | Admitting: Family

## 2013-11-21 ENCOUNTER — Ambulatory Visit: Payer: Medicare Other | Admitting: Vascular Surgery

## 2013-11-21 ENCOUNTER — Ambulatory Visit (INDEPENDENT_AMBULATORY_CARE_PROVIDER_SITE_OTHER): Payer: Medicare Other | Admitting: Family

## 2013-11-21 ENCOUNTER — Ambulatory Visit (HOSPITAL_COMMUNITY)
Admission: RE | Admit: 2013-11-21 | Discharge: 2013-11-21 | Disposition: A | Discharge status: Home / Self Care | Payer: Medicare Other | Source: Ambulatory Visit | Attending: Family | Admitting: Family

## 2013-11-21 ENCOUNTER — Encounter: Payer: Self-pay | Admitting: Family

## 2013-11-21 ENCOUNTER — Other Ambulatory Visit (HOSPITAL_COMMUNITY): Payer: Medicare Other

## 2013-11-21 VITALS — BP 146/68 | HR 47 | Resp 14 | Ht 66.0 in | Wt 221.0 lb

## 2013-11-21 DIAGNOSIS — M79609 Pain in unspecified limb: Secondary | ICD-10-CM | POA: Insufficient documentation

## 2013-11-21 DIAGNOSIS — I6529 Occlusion and stenosis of unspecified carotid artery: Secondary | ICD-10-CM | POA: Insufficient documentation

## 2013-11-21 NOTE — Patient Instructions (Addendum)
Stroke Prevention Some medical conditions and behaviors are associated with an increased chance of having a stroke. You may prevent a stroke by making healthy choices and managing medical conditions. HOW CAN I REDUCE MY RISK OF HAVING A STROKE?   Stay physically active. Get at least 30 minutes of activity on most or all days.  Do not smoke. It may also be helpful to avoid exposure to secondhand smoke.  Limit alcohol use. Moderate alcohol use is considered to be:  No more than 2 drinks per day for men.  No more than 1 drink per day for nonpregnant women.  Eat healthy foods. This involves  Eating 5 or more servings of fruits and vegetables a day.  Following a diet that addresses high blood pressure (hypertension), high cholesterol, diabetes, or obesity.  Manage your cholesterol levels.  A diet low in saturated fat, trans fat, and cholesterol and high in fiber may control cholesterol levels.  Take any prescribed medicines to control cholesterol as directed by your health care provider.  Manage your diabetes.  A controlled-carbohydrate, controlled-sugar diet is recommended to manage diabetes.  Take any prescribed medicines to control diabetes as directed by your health care provider.  Control your hypertension.  A low-salt (sodium), low-saturated fat, low-trans fat, and low-cholesterol diet is recommended to manage hypertension.  Take any prescribed medicines to control hypertension as directed by your health care provider.  Maintain a healthy weight.  A reduced-calorie, low-sodium, low-saturated fat, low-trans fat, low-cholesterol diet is recommended to manage weight.  Stop drug abuse.  Avoid taking birth control pills.  Talk to your health care provider about the risks of taking birth control pills if you are over 66 years old, smoke, get migraines, or have ever had a blood clot.  Get evaluated for sleep disorders (sleep apnea).  Talk to your health care provider about  getting a sleep evaluation if you snore a lot or have excessive sleepiness.  Take medicines as directed by your health care provider.  For some people, aspirin or blood thinners (anticoagulants) are helpful in reducing the risk of forming abnormal blood clots that can lead to stroke. If you have the irregular heart rhythm of atrial fibrillation, you should be on a blood thinner unless there is a good reason you cannot take them.  Understand all your medicine instructions.  Make sure that other other conditions (such as anemia or atherosclerosis) are addressed. SEEK IMMEDIATE MEDICAL CARE IF:   You have sudden weakness or numbness of the face, arm, or leg, especially on one side of the body.  Your face or eyelid droops to one side.  You have sudden confusion.  You have trouble speaking (aphasia) or understanding.  You have sudden trouble seeing in one or both eyes.  You have sudden trouble walking.  You have dizziness.  You have a loss of balance or coordination.  You have a sudden, severe headache with no known cause.  You have new chest pain or an irregular heartbeat. Any of these symptoms may represent a serious problem that is an emergency. Do not wait to see if the symptoms will go away. Get medical help at once. Call your local emergency services  (911 in [place] Do not drive yourself to the hospital. Document Released: 07/20/2004 Document Revised: 04/02/2013 Document Reviewed: 12/13/2012 [facility] Patient Information 2014 [place].   Venous Stasis or Chronic Venous Insufficiency Chronic venous insufficiency, also called venous stasis, is a condition that affects the veins in the legs. The condition  prevents blood from being pumped through these veins effectively. Blood may no longer be pumped effectively from the legs back to the heart. This condition can range from mild to severe. With proper treatment, you should be able to continue with an active life. CAUSES   Chronic venous insufficiency occurs when the vein walls become stretched, weakened, or damaged or when valves within the vein are damaged. Some common causes of this include:  High blood pressure inside the veins (venous hypertension).  Increased blood pressure in the leg veins from long periods of sitting or standing.  A blood clot that blocks blood flow in a vein (deep vein thrombosis).  Inflammation of a superficial vein (phlebitis) that causes a blood clot to form. RISK FACTORS Various things can make you more likely to develop chronic venous insufficiency, including:  Family history of this condition.  Obesity.  Pregnancy.  Sedentary lifestyle.  Smoking.  Jobs requiring long periods of standing or sitting in one place.  Being a certain age. Women in their 58s and 60s and men in their 43s are more likely to develop this condition. SIGNS AND SYMPTOMS  Symptoms may include:   Varicose veins.  Skin breakdown or ulcers.  Reddened or discolored skin on the leg.  Brown, smooth, tight, and painful skin just above the ankle, usually on the inside surface (lipodermatosclerosis).  Swelling. DIAGNOSIS  To diagnose this condition, your health care provider will take a medical history and do a physical exam. The following tests may be ordered to confirm the diagnosis:  Duplex ultrasound A procedure that produces a picture of a blood vessel and nearby organs and also provides information on blood flow through the blood vessel.  Plethysmography A procedure that tests blood flow.  A venogram, or venography A procedure used to look at the veins using X-ray and dye. TREATMENT The goals of treatment are to help you return to an active life and to minimize pain or disability. Treatment will depend on the severity of the condition. Medical procedures may be needed for severe cases. Treatment options may include:   Use of compression stockings. These can help with symptoms and lower  the chances of the problem getting worse, but they do not cure the problem.  Sclerotherapy A procedure involving an injection of a material that "dissolves" the damaged veins. Other veins in the network of blood vessels take over the function of the damaged veins.  Surgery to remove the vein or cut off blood flow through the vein (vein stripping or laser ablation surgery).  Surgery to repair a valve. HOME CARE INSTRUCTIONS   Wear compression stockings as directed by your health care provider.  Only take over-the-counter or prescription medicines for pain, discomfort, or fever as directed by your health care provider.  Follow up with your health care provider as directed. SEEK MEDICAL CARE IF:   You have redness, swelling, or increasing pain in the affected area.  You see a red streak or line that extends up or down from the affected area.  You have a breakdown or loss of skin in the affected area, even if the breakdown is small.  You have an injury to the affected area. SEEK IMMEDIATE MEDICAL CARE IF:   You have an injury and open wound in the affected area.  Your pain is severe and does not improve with medicine.  You have sudden numbness or weakness in the foot or ankle below the affected area, or you have trouble moving your foot  or ankle.  You have a fever or persistent symptoms for more than 2 3 days.  You have a fever and your symptoms suddenly get worse. MAKE SURE YOU:   Understand these instructions.  Will watch your condition.  Will get help right away if you are not doing well or get worse. Document Released: 10/16/2006 Document Revised: 04/02/2013 Document Reviewed: 02/17/2013 [facility] Patient Information 2014 [place].   Obtain over the counter graduated compression knee high stockings. Measure your legs before you get out of bed: the length from heel to crease of knee, circumference of calf and ankle.

## 2013-11-21 NOTE — Progress Notes (Signed)
Established Carotid Patient   History of Present Illness  Cassandra Adams is a 68 y.o. female followed by Dr. Bridgett Larsson for known carotid stenosis. She returns today for follow up.  Patient has not had previous carotid artery intervention. She has numbness in her feet that she attributes to DM. She does not seem to have claudication symptoms with walking, denies non healing wounds. Pt denies any history of MI.  She sees a nephrologist for CKD.  Patient has Negative history of TIA or stroke symptom.  The patient denies amaurosis fugax or monocular blindness.  The patient  denies facial drooping.  Pt. denies hemiplegia.  The patient denies receptive or expressive aphasia.  Pt. denies extremity weakness.  Pt reports no new medical problems or surgeries in the last year. Uses stationary bike 40 minutes daily, 5 days/week.  Pt Diabetic: Yes,  Pt smoker: non-smoker  Pt meds include: Statin : Yes ASA: Yes Other anticoagulants/antiplatelets: no   Past Medical History  Diagnosis Date  . Diabetes mellitus   . Hypertension   . Stroke     tia  . Renal disorder     Social History History  Substance Use Topics  . Smoking status: Never Smoker   . Smokeless tobacco: Never Used  . Alcohol Use: No    Family History Family History  Problem Relation Age of Onset  . Hyperlipidemia Mother   . Hypertension Mother   . Heart attack Mother   . Cancer Father     lung  . Hypertension Sister   . Diabetes Brother   . Heart disease Brother   . Hyperlipidemia Brother   . Hypertension Brother   . Heart attack Brother   . Other Brother     DVT    Surgical History Past Surgical History  Procedure Laterality Date  . Replacement total knee  05-03-06  . Tonsillectomy  1972  . Esophagogastroduodenoscopy  11/08/2007     Normal esophagus without evidence of Barrett, mass, erosion/ Normal stomach, duodenal bulb  . Gastric emptying study  11/13/2007    mildly delayed emptying  subjectively, but normal  analysis-77% of tracer emptied at 2 hours  . Laparoscopic appendectomy N/A 09/15/2012    Procedure: APPENDECTOMY LAPAROSCOPIC;  Surgeon: [name], MD;  Location: [facility];  Service: General;  Laterality: N/A;  . Joint replacement Right 05-03-06    Knee  . Appendectomy  March 2014    had appendix frozen    Allergies  Allergen Reactions  . Penicillins Hives    Current Outpatient Prescriptions  Medication Sig Dispense Refill  . ACCU-CHEK AVIVA PLUS test strip       . amitriptyline (ELAVIL) 25 MG tablet Take 25-50 mg by mouth every other day. Taken every other day at bedtime for headaches      . amLODipine (NORVASC) 10 MG tablet Take 10 mg by mouth daily.      [name] aspirin EC 81 MG tablet Take 81 mg by mouth daily.      [name] atenolol (TENORMIN) 25 MG tablet Take 25 mg by mouth daily.      . Cholecalciferol (VITAMIN D-3) 1000 UNITS CAPS Take 1 capsule by mouth every morning.       . cloNIDine (CATAPRES) 0.1 MG tablet Take 0.1 mg by mouth 4 (four) times daily - after meals and at bedtime.       . colesevelam (WELCHOL) 625 MG tablet Take 1,875 mg by mouth 2 (two) times daily with a meal. *May take  6 tablets once a day with meals*      . furosemide (LASIX) 40 MG tablet Take 40 mg by mouth every morning.       . insulin aspart (NOVOLOG) 100 UNIT/ML injection Inject 5-11 Units into the skin 3 (three) times daily before meals. Units vary      . insulin glargine (LANTUS) 100 UNIT/ML injection Inject 27 Units into the skin at bedtime.      . Lancets (ACCU-CHEK MULTICLIX) lancets       . lisinopril-hydrochlorothiazide (PRINZIDE,ZESTORETIC) 20-25 MG per tablet       . pravastatin (PRAVACHOL) 20 MG tablet Take 20 mg by mouth at bedtime.      [name] lisinopril (PRINIVIL,ZESTRIL) 20 MG tablet Take 20 mg by mouth daily.      . predniSONE (DELTASONE) 20 MG tablet 2 tabs po daily x 3 days  6 tablet  0   No current facility-administered medications for this visit.    Review of Systems  : See HPI for pertinent positives and negatives.  Physical Examination  Filed Vitals:   11/21/13 0939  BP: 146/68  Pulse: 47  Resp: 14    General: WDWN obese female in NAD GAIT: normal Eyes: PERRLA Pulmonary:  Non-labored, CTAB, Negative  Rales, Negative rhonchi, & Negative wheezing.  Cardiac: regular Rhythm ,  Negative detected murmur.  VASCULAR EXAM Carotid Bruits Left Right   Negative Negative     Radial pulses are 2+ palpable and equal.                                                                                                                            LE Pulses LEFT RIGHT       POPLITEAL  not palpable   not palpable       POSTERIOR TIBIAL  2+ palpable   not palpable        DORSALIS PEDIS      ANTERIOR TIBIAL not palpable  1+ palpable     Gastrointestinal: soft, nontender, BS WNL, no r/g,  negative masses.  Musculoskeletal: Negative muscle atrophy/wasting. M/S 5/5 in UE's, 4/5 in LE's, Extremities without ischemic changes. 2-3+ bilateral pretibial pitting edema.  Neurologic: A&O X 3; Appropriate Affect ; SENSATION ;normal;  Speech is normal CN 2-12 intact, Pain and light touch intact in extremities, Motor exam as listed above.   Non-Invasive Vascular Imaging CAROTID DUPLEX 11/21/2013 CEREBROVASCULAR DUPLEX EVALUATION    INDICATION: Carotid artery disease     PREVIOUS INTERVENTION(S):     DUPLEX EXAM:     RIGHT  LEFT  Peak Systolic Velocities (cm/s) End Diastolic Velocities (cm/s) Plaque LOCATION Peak Systolic Velocities (cm/s) End Diastolic Velocities (cm/s) Plaque  83 12  CCA PROXIMAL 104 12   71 12  CCA MID 92 11   63 11 HT CCA DISTAL 77 15 HT  130 10  ECA 89 0   125 28 HT ICA PROXIMAL 108 20 HT  105 24  ICA  MID 91 21   99 20  ICA DISTAL 72 19     1.76 ICA / CCA Ratio (PSV) 1.17  Antegrade  Vertebral Flow Antegrade   Q000111Q Brachial Systolic Pressure (mmHg) Q000111Q  Triphasic  Brachial Artery Waveforms Triphasic     Plaque Morphology:  HM =  Homogeneous, HT = Heterogeneous, CP = Calcific Plaque, SP = Smooth Plaque, IP = Irregular Plaque     ADDITIONAL FINDINGS:     IMPRESSION: Bilateral internal carotid artery velocities suggest a <40% stenosis.     Compared to the previous exam:  No significant change in comparison to the last exam on 11/15/2012.    Assessment: LYRICS GUERRIER is a 68 y.o. female who presents with asymptomatic minimal bilateral ICA stenosis. The  ICA stenosis is  Unchanged from previous exam. Venous insufficiency: She has bilateral pretibial edema at 2-3+ that is not present in the morning, advised 20-30 mm Hg graduated knee hi compression hose, don in the morning, remove at bedtime.  Plan: Follow-up in 1 year with Carotid Duplex scan.   I discussed in depth with the patient the nature of atherosclerosis, and emphasized the importance of maximal medical management including strict control of blood pressure, blood glucose, and lipid levels, obtaining regular exercise, and continued cessation of smoking.  The patient is aware that without maximal medical management the underlying atherosclerotic disease process will progress, limiting the benefit of any interventions. The patient was given information about stroke prevention and what symptoms should prompt the patient to seek immediate medical care. Thank you for allowing [place] to participate in this patient's care.  Clemon Chambers, RN, MSN, FNP-C [facility] Office: [phone]  Clinic Physician: Bridgett Larsson  11/21/2013 9:57 AM

## 2013-11-21 NOTE — Addendum Note (Signed)
Addended by: Mena Goes on: 11/21/2013 10:46 AM   Modules accepted: Orders

## 2014-01-03 ENCOUNTER — Encounter (HOSPITAL_COMMUNITY): Payer: Self-pay | Admitting: Emergency Medicine

## 2014-01-03 ENCOUNTER — Inpatient Hospital Stay (HOSPITAL_COMMUNITY)
Admission: EM | Admit: 2014-01-03 | Discharge: 2014-01-06 | DRG: 282 | Disposition: A | Discharge status: Home / Self Care | Payer: Medicare Other | Attending: Cardiology | Admitting: Cardiology

## 2014-01-03 ENCOUNTER — Emergency Department (HOSPITAL_COMMUNITY): Payer: Medicare Other

## 2014-01-03 DIAGNOSIS — N183 Chronic kidney disease, stage 3 unspecified: Secondary | ICD-10-CM | POA: Diagnosis present

## 2014-01-03 DIAGNOSIS — I214 Non-ST elevation (NSTEMI) myocardial infarction: Secondary | ICD-10-CM | POA: Diagnosis present

## 2014-01-03 DIAGNOSIS — I959 Hypotension, unspecified: Secondary | ICD-10-CM | POA: Diagnosis present

## 2014-01-03 DIAGNOSIS — I2510 Atherosclerotic heart disease of native coronary artery without angina pectoris: Secondary | ICD-10-CM | POA: Diagnosis present

## 2014-01-03 DIAGNOSIS — M129 Arthropathy, unspecified: Secondary | ICD-10-CM | POA: Diagnosis present

## 2014-01-03 DIAGNOSIS — Z7982 Long term (current) use of aspirin: Secondary | ICD-10-CM

## 2014-01-03 DIAGNOSIS — E785 Hyperlipidemia, unspecified: Secondary | ICD-10-CM | POA: Diagnosis present

## 2014-01-03 DIAGNOSIS — E119 Type 2 diabetes mellitus without complications: Secondary | ICD-10-CM

## 2014-01-03 DIAGNOSIS — Z8673 Personal history of transient ischemic attack (TIA), and cerebral infarction without residual deficits: Secondary | ICD-10-CM

## 2014-01-03 DIAGNOSIS — IMO0001 Reserved for inherently not codable concepts without codable children: Secondary | ICD-10-CM | POA: Diagnosis present

## 2014-01-03 DIAGNOSIS — Z880 Allergy status to penicillin: Secondary | ICD-10-CM

## 2014-01-03 DIAGNOSIS — Z96659 Presence of unspecified artificial knee joint: Secondary | ICD-10-CM | POA: Diagnosis not present

## 2014-01-03 DIAGNOSIS — I129 Hypertensive chronic kidney disease with stage 1 through stage 4 chronic kidney disease, or unspecified chronic kidney disease: Secondary | ICD-10-CM | POA: Diagnosis present

## 2014-01-03 DIAGNOSIS — Z794 Long term (current) use of insulin: Secondary | ICD-10-CM | POA: Diagnosis not present

## 2014-01-03 DIAGNOSIS — D573 Sickle-cell trait: Secondary | ICD-10-CM | POA: Diagnosis present

## 2014-01-03 DIAGNOSIS — E1165 Type 2 diabetes mellitus with hyperglycemia: Secondary | ICD-10-CM

## 2014-01-03 DIAGNOSIS — Z8249 Family history of ischemic heart disease and other diseases of the circulatory system: Secondary | ICD-10-CM | POA: Diagnosis not present

## 2014-01-03 DIAGNOSIS — Z833 Family history of diabetes mellitus: Secondary | ICD-10-CM | POA: Diagnosis not present

## 2014-01-03 DIAGNOSIS — I10 Essential (primary) hypertension: Secondary | ICD-10-CM

## 2014-01-03 HISTORY — DX: Unspecified osteoarthritis, unspecified site: M19.90

## 2014-01-03 HISTORY — DX: Chronic kidney disease, stage 3 (moderate): N18.3

## 2014-01-03 HISTORY — DX: Chronic kidney disease, stage 3 unspecified: N18.30

## 2014-01-03 HISTORY — DX: Migraine, unspecified, not intractable, without status migrainosus: G43.909

## 2014-01-03 HISTORY — DX: Type 2 diabetes mellitus without complications: E11.9

## 2014-01-03 HISTORY — DX: Atherosclerotic heart disease of native coronary artery without angina pectoris: I25.10

## 2014-01-03 HISTORY — DX: Sickle-cell trait: D57.3

## 2014-01-03 HISTORY — DX: Transient cerebral ischemic attack, unspecified: G45.9

## 2014-01-03 LAB — COMPREHENSIVE METABOLIC PANEL
ALBUMIN: 3.3 g/dL — AB (ref 3.5–5.2)
ALK PHOS: 97 U/L (ref 39–117)
ALT: 24 U/L (ref 0–35)
ANION GAP: 12 (ref 5–15)
AST: 90 U/L — ABNORMAL HIGH (ref 0–37)
BILIRUBIN TOTAL: 0.4 mg/dL (ref 0.3–1.2)
BUN: 21 mg/dL (ref 6–23)
CHLORIDE: 99 meq/L (ref 96–112)
CO2: 25 mEq/L (ref 19–32)
CREATININE: 1.25 mg/dL — AB (ref 0.50–1.10)
Calcium: 10 mg/dL (ref 8.4–10.5)
GFR, EST AFRICAN AMERICAN: 50 mL/min — AB (ref >90)
GFR, EST NON AFRICAN AMERICAN: 43 mL/min — AB (ref >90)
GLUCOSE: 258 mg/dL — AB (ref 70–99)
POTASSIUM: 4.6 meq/L (ref 3.7–5.3)
Sodium: 136 mEq/L — ABNORMAL LOW (ref 137–147)
Total Protein: 6.9 g/dL (ref 6.0–8.3)

## 2014-01-03 LAB — CBC WITH DIFFERENTIAL/PLATELET
BASOS PCT: 0 % (ref 0–1)
Basophils Absolute: 0 10*3/uL (ref 0.0–0.1)
Eosinophils Absolute: 0 10*3/uL (ref 0.0–0.7)
Eosinophils Relative: 0 % (ref 0–5)
HEMATOCRIT: 35.2 % — AB (ref 36.0–46.0)
HEMOGLOBIN: 12.4 g/dL (ref 12.0–15.0)
LYMPHS ABS: 1 10*3/uL (ref 0.7–4.0)
Lymphocytes Relative: 11 % — ABNORMAL LOW (ref 12–46)
MCH: 27.4 pg (ref 26.0–34.0)
MCHC: 35.2 g/dL (ref 30.0–36.0)
MCV: 77.9 fL — ABNORMAL LOW (ref 78.0–100.0)
MONO ABS: 0.9 10*3/uL (ref 0.1–1.0)
MONOS PCT: 10 % (ref 3–12)
NEUTROS ABS: 7.4 10*3/uL (ref 1.7–7.7)
Neutrophils Relative %: 79 % — ABNORMAL HIGH (ref 43–77)
Platelets: 191 10*3/uL (ref 150–400)
RBC: 4.52 MIL/uL (ref 3.87–5.11)
RDW: 14.1 % (ref 11.5–15.5)
WBC: 9.5 10*3/uL (ref 4.0–10.5)

## 2014-01-03 LAB — TROPONIN I: Troponin I: 15.74 ng/mL (ref <0.30)

## 2014-01-03 LAB — LIPASE, BLOOD: LIPASE: 8 U/L — AB (ref 11–59)

## 2014-01-03 MED ORDER — NITROGLYCERIN 2 % TD OINT
1.0000 [in_us] | TOPICAL_OINTMENT | Freq: Once | TRANSDERMAL | Status: AC
Start: 2014-01-03 — End: 2014-01-03
  Administered 2014-01-03: 1 [in_us] via TOPICAL
  Filled 2014-01-03: qty 1

## 2014-01-03 MED ORDER — KETOROLAC TROMETHAMINE 30 MG/ML IJ SOLN
30.0000 mg | Freq: Once | INTRAMUSCULAR | Status: AC
  Administered 2014-01-03: 30 mg via INTRAVENOUS
  Filled 2014-01-03: qty 1

## 2014-01-03 MED ORDER — LORAZEPAM 2 MG/ML IJ SOLN
0.5000 mg | Freq: Once | INTRAMUSCULAR | Status: AC
  Administered 2014-01-03: 0.5 mg via INTRAVENOUS
  Filled 2014-01-03: qty 1

## 2014-01-03 MED ORDER — ONDANSETRON HCL 4 MG/2ML IJ SOLN
4.0000 mg | Freq: Once | INTRAMUSCULAR | Status: AC
  Filled 2014-01-03: qty 2

## 2014-01-03 MED ORDER — MORPHINE SULFATE 4 MG/ML IJ SOLN
4.0000 mg | Freq: Once | INTRAMUSCULAR | Status: AC
  Administered 2014-01-03: 4 mg via INTRAVENOUS
  Filled 2014-01-03: qty 1

## 2014-01-03 MED ORDER — ONDANSETRON HCL 4 MG/2ML IJ SOLN
INTRAMUSCULAR | Status: AC
  Administered 2014-01-03: 4 mg via INTRAVENOUS
  Filled 2014-01-03: qty 2

## 2014-01-03 MED ORDER — ASPIRIN 81 MG PO CHEW
324.0000 mg | CHEWABLE_TABLET | Freq: Once | ORAL | Status: AC
  Administered 2014-01-03: 324 mg via ORAL
  Filled 2014-01-03: qty 4

## 2014-01-03 MED ORDER — NITROGLYCERIN IN D5W 200-5 MCG/ML-% IV SOLN
5.0000 ug/min | INTRAVENOUS | Status: DC
  Administered 2014-01-03: 5 ug/min via INTRAVENOUS
  Filled 2014-01-03: qty 250

## 2014-01-03 NOTE — ED Notes (Signed)
Mid sternal chest pain x 1 week that is worse with palpation.  Pt took own 324 baby aspirin at home,  Given one nitro by ems without improvement

## 2014-01-03 NOTE — ED Provider Notes (Signed)
CSN: [id number]     Arrival date & time 01/03/14  1924 History  This chart was scribed for Maudry Diego, MD by Girtha Hake, ED Scribe. The patient was seen in [facility]. The patient's care was started at 7:59 PM.     Chief Complaint  Patient presents with  . Chest Pain    Patient is a 68 y.o. female presenting with chest pain. The history is provided by the patient. No language interpreter was used.  Chest Pain Pain quality: aching   Pain radiates to:  Does not radiate Pain radiates to the back: no   Pain severity:  Moderate Duration:  1 week Timing:  Constant Worsened by:  Coughing Ineffective treatments:  None tried Associated symptoms: nausea   Associated symptoms: no abdominal pain, no back pain, no cough, no fatigue, no fever, no headache and not vomiting    HPI Comments: Cassandra Adams is a 68 y.o. female with a history of DM who presents to the Emergency Department complaining of constant, aching CP beginning one week ago. Patient reports associated nausea. She reports that the pain is exacerbated by coughing. Patient denies fever, chills, vomiting, or diarrhea.   PCP is Dr. Gerarda Fraction.  Past Medical History  Diagnosis Date  . Diabetes mellitus   . Hypertension   . Stroke     tia  . Renal disorder    Past Surgical History  Procedure Laterality Date  . Replacement total knee  05-03-06  . Tonsillectomy  1972  . Esophagogastroduodenoscopy  11/08/2007     Normal esophagus without evidence of Barrett, mass, erosion/ Normal stomach, duodenal bulb  . Gastric emptying study  11/13/2007    mildly delayed emptying subjectively, but normal  analysis-77% of tracer emptied at 2 hours  . Laparoscopic appendectomy N/A 09/15/2012    Procedure: APPENDECTOMY LAPAROSCOPIC;  Surgeon: [name], MD;  Location: [facility];  Service: General;  Laterality: N/A;  . Joint replacement Right 05-03-06    Knee  . Appendectomy  March 2014    had appendix frozen   Family History   Problem Relation Age of Onset  . Hyperlipidemia Mother   . Hypertension Mother   . Heart attack Mother   . Cancer Father     lung  . Hypertension Sister   . Diabetes Brother   . Heart disease Brother   . Hyperlipidemia Brother   . Hypertension Brother   . Heart attack Brother   . Other Brother     DVT   History  Substance Use Topics  . Smoking status: Never Smoker   . Smokeless tobacco: Never Used  . Alcohol Use: No   OB History   Grav Para Term Preterm Abortions TAB SAB Ect Mult Living   2 2 2       2      Review of Systems  Constitutional: Negative for fever, chills, appetite change and fatigue.  HENT: Negative for congestion, ear discharge and sinus pressure.   Eyes: Negative for discharge.  Respiratory: Negative for cough.   Cardiovascular: Positive for chest pain.  Gastrointestinal: Positive for nausea. Negative for vomiting, abdominal pain and diarrhea.  Genitourinary: Negative for frequency and hematuria.  Musculoskeletal: Negative for back pain.  Skin: Positive for rash (on anterior neck).  Neurological: Negative for seizures and headaches.  Psychiatric/Behavioral: Negative for hallucinations.      Allergies  Penicillins  Home Medications   Prior to Admission medications   Medication Sig Start Date End Date Taking?  Authorizing Provider  ACCU-CHEK AVIVA PLUS test strip  11/20/13   Historical Provider, MD  amitriptyline (ELAVIL) 25 MG tablet Take 25-50 mg by mouth every other day. Taken every other day at bedtime for headaches 10/17/12   [name], MD  amLODipine (NORVASC) 10 MG tablet Take 10 mg by mouth daily.    Historical Provider, MD  aspirin EC 81 MG tablet Take 81 mg by mouth daily.    Historical Provider, MD  atenolol (TENORMIN) 25 MG tablet Take 25 mg by mouth daily.    Historical Provider, MD  Cholecalciferol (VITAMIN D-3) 1000 UNITS CAPS Take 1 capsule by mouth every morning.     Historical Provider, MD  cloNIDine (CATAPRES) 0.1 MG tablet Take  0.1 mg by mouth 4 (four) times daily - after meals and at bedtime.     Historical Provider, MD  colesevelam (WELCHOL) 625 MG tablet Take 1,875 mg by mouth 2 (two) times daily with a meal. *May take 6 tablets once a day with meals*    Historical Provider, MD  furosemide (LASIX) 40 MG tablet Take 40 mg by mouth every morning.     Historical Provider, MD  insulin aspart (NOVOLOG) 100 UNIT/ML injection Inject 5-11 Units into the skin 3 (three) times daily before meals. Units vary    Historical Provider, MD  insulin glargine (LANTUS) 100 UNIT/ML injection Inject 27 Units into the skin at bedtime.    Historical Provider, MD  Lancets (ACCU-CHEK MULTICLIX) lancets  10/17/13   Historical Provider, MD  lisinopril (PRINIVIL,ZESTRIL) 20 MG tablet Take 20 mg by mouth daily. 09/08/13   Historical Provider, MD  lisinopril-hydrochlorothiazide (PRINZIDE,ZESTORETIC) 20-25 MG per tablet  08/25/13   Historical Provider, MD  pravastatin (PRAVACHOL) 20 MG tablet Take 20 mg by mouth at bedtime. 10/10/13   Historical Provider, MD  predniSONE (DELTASONE) 20 MG tablet 2 tabs po daily x 3 days 10/12/13   Babette Relic, MD   Triage Vitals: BP 91/58  Pulse 60  Temp(Src) 98.7 F (37.1 C) (Oral)  Resp 18  Ht 5\' 7"  (1.702 m)  Wt 215 lb (97.523 kg)  BMI 33.67 kg/m2  SpO2 96% Physical Exam  Constitutional: She is oriented to person, place, and time. She appears well-developed.  HENT:  Head: Normocephalic.  Eyes: Conjunctivae and EOM are normal. No scleral icterus.  Neck: Neck supple. No thyromegaly present.  Cardiovascular: Normal rate and regular rhythm.  Exam reveals no gallop and no friction rub.   No murmur heard. Pulmonary/Chest: No stridor. She has no wheezes. She has no rales. She exhibits tenderness.  Tender bilaterally in anterior chest wall.   Abdominal: She exhibits no distension. There is tenderness (Mild epigastric tenderness.). There is no rebound.  Musculoskeletal: Normal range of motion. She exhibits no  edema.  Lymphadenopathy:    She has no cervical adenopathy.  Neurological: She is oriented to person, place, and time. She exhibits normal muscle tone. Coordination normal.  Skin: Rash (anterior neck) noted. No erythema.  Psychiatric: She has a normal mood and affect. Her behavior is normal.    ED Course  Procedures (including critical care time) DIAGNOSTIC STUDIES: Oxygen Saturation is 96% on room air, adequate by my interpretation.    COORDINATION OF CARE: 8:02 PM-Discussed treatment plan which includes Zofran, Toradol, abdominal x-ray, CXR, EKG, and labs with pt at bedside and pt agreed to plan.     Labs Review Labs Reviewed - No data to display  Imaging Review No results found.   EKG  Interpretation   Date/Time:  Saturday January 03 2014 19:33:52 EDT Ventricular Rate:  58 PR Interval:  157 QRS Duration: 88 QT Interval:  403 QTC Calculation: 396 R Axis:   -2 Text Interpretation:  Sinus rhythm Borderline T abnormalities, diffuse  leads Confirmed by Caffie Sotto  MD, Glynnis Gavel [phone]) on 01/03/2014 9:42:49 PM      MDM  nonstemi mi,   [facility] by dr[facility] Tommi Rumps, danile Final diagnoses:  None  CRITICAL CARE Performed by: Barbarita Hutmacher L Total critical care time: 40 Critical care time was exclusive of separately billable procedures and treating other patients. Critical care was necessary to treat or prevent imminent or life-threatening deterioration. Critical care was time spent personally by me on the following activities: development of treatment plan with patient and/or surrogate as well as nursing, discussions with consultants, evaluation of patient's response to treatment, examination of patient, obtaining history from patient or surrogate, ordering and performing treatments and interventions, ordering and review of laboratory studies, ordering and review of radiographic studies, pulse oximetry and re-evaluation of patient's condition. The chart was scribed for me under  my direct supervision.  I personally performed the history, physical, and medical decision making and all procedures in the evaluation of this patient.Maudry Diego, MD 01/03/14 2213

## 2014-01-03 NOTE — ED Notes (Signed)
CRITICAL VALUE ALERT  Critical value received:  Troponin 15.74  Date of notification:  01/03/14  Time of notification:  2140  Critical value read back:Yes.    Nurse who received alert: Eilene Ghazi MD notifiified: Roderic Palau  Responding MD:  Zammitt  Time MD [id number]

## 2014-01-04 DIAGNOSIS — E119 Type 2 diabetes mellitus without complications: Secondary | ICD-10-CM

## 2014-01-04 DIAGNOSIS — I200 Unstable angina: Secondary | ICD-10-CM

## 2014-01-04 DIAGNOSIS — I10 Essential (primary) hypertension: Secondary | ICD-10-CM

## 2014-01-04 DIAGNOSIS — I214 Non-ST elevation (NSTEMI) myocardial infarction: Secondary | ICD-10-CM

## 2014-01-04 LAB — PROTIME-INR
INR: 0.99 (ref 0.00–1.49)
Prothrombin Time: 13.1 seconds (ref 11.6–15.2)

## 2014-01-04 LAB — BASIC METABOLIC PANEL
Anion gap: 15 (ref 5–15)
BUN: 24 mg/dL — ABNORMAL HIGH (ref 6–23)
CO2: 21 mEq/L (ref 19–32)
CREATININE: 1.37 mg/dL — AB (ref 0.50–1.10)
Calcium: 9.6 mg/dL (ref 8.4–10.5)
Chloride: 99 mEq/L (ref 96–112)
GFR calc non Af Amer: 38 mL/min — ABNORMAL LOW (ref >90)
GFR, EST AFRICAN AMERICAN: 44 mL/min — AB (ref >90)
GLUCOSE: 249 mg/dL — AB (ref 70–99)
Potassium: 4.7 mEq/L (ref 3.7–5.3)
Sodium: 135 mEq/L — ABNORMAL LOW (ref 137–147)

## 2014-01-04 LAB — GLUCOSE, CAPILLARY
GLUCOSE-CAPILLARY: 279 mg/dL — AB (ref 70–99)
Glucose-Capillary: 232 mg/dL — ABNORMAL HIGH (ref 70–99)
Glucose-Capillary: 264 mg/dL — ABNORMAL HIGH (ref 70–99)
Glucose-Capillary: 242 mg/dL — ABNORMAL HIGH (ref 70–99)

## 2014-01-04 LAB — LIPID PANEL
CHOL/HDL RATIO: 2.6 ratio
Cholesterol: 148 mg/dL (ref 0–200)
HDL: 56 mg/dL (ref >39)
LDL Cholesterol: 73 mg/dL (ref 0–99)
Triglycerides: 97 mg/dL (ref <150)
VLDL: 19 mg/dL (ref 0–40)

## 2014-01-04 LAB — HEMOGLOBIN A1C
HEMOGLOBIN A1C: 8.1 % — AB (ref <5.7)
MEAN PLASMA GLUCOSE: 186 mg/dL — AB (ref <117)

## 2014-01-04 LAB — MRSA PCR SCREENING: MRSA BY PCR: NEGATIVE

## 2014-01-04 LAB — CBC
HEMATOCRIT: 33.1 % — AB (ref 36.0–46.0)
Hemoglobin: 11.3 g/dL — ABNORMAL LOW (ref 12.0–15.0)
MCH: 26.7 pg (ref 26.0–34.0)
MCHC: 34.1 g/dL (ref 30.0–36.0)
MCV: 78.1 fL (ref 78.0–100.0)
Platelets: 162 10*3/uL (ref 150–400)
RBC: 4.24 MIL/uL (ref 3.87–5.11)
RDW: 14 % (ref 11.5–15.5)
WBC: 9.4 10*3/uL (ref 4.0–10.5)

## 2014-01-04 LAB — PRO B NATRIURETIC PEPTIDE: PRO B NATRI PEPTIDE: 4617 pg/mL — AB (ref 0–125)

## 2014-01-04 LAB — HEPARIN LEVEL (UNFRACTIONATED): Heparin Unfractionated: 0.14 IU/mL — ABNORMAL LOW (ref 0.30–0.70)

## 2014-01-04 MED ORDER — INSULIN ASPART 100 UNIT/ML ~~LOC~~ SOLN: 5.0000 [IU] | Freq: Three times a day (TID) | SUBCUTANEOUS | Status: DC

## 2014-01-04 MED ORDER — ONDANSETRON HCL 4 MG/2ML IJ SOLN
4.0000 mg | Freq: Four times a day (QID) | INTRAMUSCULAR | Status: DC | PRN
  Administered 2014-01-05: 4 mg via INTRAVENOUS
  Filled 2014-01-04: qty 2

## 2014-01-04 MED ORDER — INSULIN ASPART 100 UNIT/ML ~~LOC~~ SOLN
0.0000 [IU] | Freq: Every day | SUBCUTANEOUS | Status: DC
  Administered 2014-01-04 – 2014-01-05 (×2): 3 [IU] via SUBCUTANEOUS

## 2014-01-04 MED ORDER — ASPIRIN 300 MG RE SUPP
300.0000 mg | RECTAL | Status: AC
  Filled 2014-01-04: qty 1

## 2014-01-04 MED ORDER — HEPARIN (PORCINE) IN NACL 100-0.45 UNIT/ML-% IJ SOLN
1550.0000 [IU]/h | INTRAMUSCULAR | Status: DC
  Administered 2014-01-04: 1550 [IU]/h via INTRAVENOUS
  Administered 2014-01-04: 1000 [IU]/h via INTRAVENOUS
  Filled 2014-01-04 (×5): qty 250

## 2014-01-04 MED ORDER — ATENOLOL 25 MG PO TABS
25.0000 mg | ORAL_TABLET | Freq: Every day | ORAL | Status: DC
  Administered 2014-01-05 – 2014-01-06 (×2): 25 mg via ORAL
  Filled 2014-01-04 (×3): qty 1

## 2014-01-04 MED ORDER — ASPIRIN EC 81 MG PO TBEC
81.0000 mg | DELAYED_RELEASE_TABLET | Freq: Every day | ORAL | Status: DC
  Filled 2014-01-04: qty 1

## 2014-01-04 MED ORDER — HEPARIN BOLUS VIA INFUSION
3000.0000 [IU] | Freq: Once | INTRAVENOUS | Status: AC
  Administered 2014-01-04: 3000 [IU] via INTRAVENOUS
  Filled 2014-01-04: qty 3000

## 2014-01-04 MED ORDER — SODIUM CHLORIDE 0.9 % IJ SOLN
3.0000 mL | Freq: Two times a day (BID) | INTRAMUSCULAR | Status: DC
  Administered 2014-01-04 – 2014-01-05 (×2): 3 mL via INTRAVENOUS

## 2014-01-04 MED ORDER — AMITRIPTYLINE HCL 25 MG PO TABS
25.0000 mg | ORAL_TABLET | Freq: Every day | ORAL | Status: DC
  Administered 2014-01-04 – 2014-01-05 (×2): 25 mg via ORAL
  Filled 2014-01-04 (×3): qty 1

## 2014-01-04 MED ORDER — MORPHINE SULFATE 4 MG/ML IJ SOLN
4.0000 mg | Freq: Once | INTRAMUSCULAR | Status: AC
  Administered 2014-01-04: 4 mg via INTRAVENOUS
  Filled 2014-01-04: qty 1

## 2014-01-04 MED ORDER — SODIUM CHLORIDE 0.9 % IV SOLN
INTRAVENOUS | Status: DC
  Administered 2014-01-05: 1000 mL via INTRAVENOUS
  Administered 2014-01-05: 08:00:00 via INTRAVENOUS

## 2014-01-04 MED ORDER — INSULIN GLARGINE 100 UNIT/ML ~~LOC~~ SOLN
27.0000 [IU] | Freq: Every day | SUBCUTANEOUS | Status: DC
  Administered 2014-01-04 – 2014-01-05 (×2): 27 [IU] via SUBCUTANEOUS
  Filled 2014-01-04 (×3): qty 0.27

## 2014-01-04 MED ORDER — SODIUM CHLORIDE 0.9 % IV SOLN: 250.0000 mL | INTRAVENOUS | Status: DC | PRN

## 2014-01-04 MED ORDER — ASPIRIN 81 MG PO CHEW
81.0000 mg | CHEWABLE_TABLET | ORAL | Status: AC
  Administered 2014-01-05: 81 mg via ORAL
  Filled 2014-01-04: qty 1

## 2014-01-04 MED ORDER — ATORVASTATIN CALCIUM 80 MG PO TABS
80.0000 mg | ORAL_TABLET | Freq: Every day | ORAL | Status: DC
  Administered 2014-01-04 – 2014-01-05 (×2): 80 mg via ORAL
  Filled 2014-01-04 (×3): qty 1

## 2014-01-04 MED ORDER — SODIUM CHLORIDE 0.9 % IJ SOLN: 3.0000 mL | INTRAMUSCULAR | Status: DC | PRN

## 2014-01-04 MED ORDER — ASPIRIN EC 81 MG PO TBEC: 81.0000 mg | DELAYED_RELEASE_TABLET | Freq: Every day | ORAL | Status: DC

## 2014-01-04 MED ORDER — NITROGLYCERIN 0.4 MG SL SUBL: 0.4000 mg | SUBLINGUAL_TABLET | SUBLINGUAL | Status: DC | PRN

## 2014-01-04 MED ORDER — BIOTENE DRY MOUTH MT LIQD
15.0000 mL | Freq: Two times a day (BID) | OROMUCOSAL | Status: DC
  Administered 2014-01-04 – 2014-01-05 (×3): 15 mL via OROMUCOSAL

## 2014-01-04 MED ORDER — SODIUM CHLORIDE 0.9 % IV BOLUS (SEPSIS)
500.0000 mL | Freq: Once | INTRAVENOUS | Status: AC
  Administered 2014-01-04: 500 mL via INTRAVENOUS

## 2014-01-04 MED ORDER — INSULIN ASPART 100 UNIT/ML ~~LOC~~ SOLN
0.0000 [IU] | Freq: Three times a day (TID) | SUBCUTANEOUS | Status: DC
  Administered 2014-01-04 (×2): 5 [IU] via SUBCUTANEOUS
  Administered 2014-01-04 – 2014-01-05 (×2): 8 [IU] via SUBCUTANEOUS
  Administered 2014-01-05: 3 [IU] via SUBCUTANEOUS
  Administered 2014-01-06: 09:00:00 5 [IU] via SUBCUTANEOUS

## 2014-01-04 MED ORDER — NITROGLYCERIN IN D5W 200-5 MCG/ML-% IV SOLN
2.0000 ug/min | INTRAVENOUS | Status: DC
  Administered 2014-01-04: 5 ug/min via INTRAVENOUS

## 2014-01-04 MED ORDER — COLESEVELAM HCL 625 MG PO TABS
1875.0000 mg | ORAL_TABLET | Freq: Two times a day (BID) | ORAL | Status: DC
  Administered 2014-01-04 – 2014-01-06 (×5): 1875 mg via ORAL
  Filled 2014-01-04 (×7): qty 3

## 2014-01-04 MED ORDER — ACETAMINOPHEN 325 MG PO TABS: 650.0000 mg | ORAL_TABLET | ORAL | Status: DC | PRN

## 2014-01-04 MED ORDER — HEPARIN SODIUM (PORCINE) 5000 UNIT/ML IJ SOLN
INTRAMUSCULAR | Status: AC
  Filled 2014-01-04: qty 1

## 2014-01-04 MED ORDER — ASPIRIN 81 MG PO CHEW: 324.0000 mg | CHEWABLE_TABLET | ORAL | Status: AC

## 2014-01-04 MED ORDER — HEPARIN SODIUM (PORCINE) 1000 UNIT/ML IJ SOLN
5000.0000 [IU] | Freq: Once | INTRAMUSCULAR | Status: AC
  Administered 2014-01-04: 5000 [IU] via INTRAVENOUS
  Filled 2014-01-04: qty 5

## 2014-01-04 NOTE — ED Notes (Signed)
EDP advises pt states her pain now a 1/10 after medication. When this nurse ask pt states its still a 9/10.

## 2014-01-04 NOTE — Progress Notes (Signed)
  Echocardiogram 2D Echocardiogram has been performed.  Darlina Sicilian M 01/04/2014, 3:27 PM

## 2014-01-04 NOTE — Progress Notes (Signed)
ANTICOAGULATION CONSULT NOTE - Follow Up Consult  Pharmacy Consult for Heparin Indication: chest pain/ACS  Allergies  Allergen Reactions  . Penicillins Hives    Patient Measurements: Height: 5\' 7"  (170.2 cm) Weight: 215 lb 6.2 oz (97.7 kg) IBW/kg (Calculated) : 61.6 Heparin Dosing Weight: 84 kg  Vital Signs: Temp: 98.1 F (36.7 C) (07/12 1132) Temp src: Oral (07/12 1132) BP: 110/48 mmHg (07/12 1132) Pulse Rate: 50 (07/12 1132)  Labs:  Recent Labs  01/03/14 2058 01/04/14 0535 01/04/14 1005  HGB 12.4 11.3*  --   HCT 35.2* 33.1*  --   PLT 191 162  --   LABPROT  --  13.1  --   INR  --  0.99  --   HEPARINUNFRC  --   --  0.14*  CREATININE 1.25* 1.37*  --   TROPONINI 15.74*  --   --     Estimated Creatinine Clearance: 46.5 ml/min (by C-G formula based on Cr of 1.37).  Assessment:   Initial heparin level is suptherapeutic (0.14) on 1000 units/hr after 4000 unit bolus. Troponin 15.74 last night. No additional troponins. On IV Nitro. Planning cardiac cath on 7/13.  Goal of Therapy:  Heparin level 0.3-0.7 units/ml Monitor platelets by anticoagulation protocol: Yes   Plan:   Re-bolus with Heparin 3000 units IV x 1.  Increase heparin drip to 1300 units/hr.  Next heparin level in ~ 6 hrs.  Daily heparin level and CBC while on heparin.  Arty Baumgartner, [place] Pager: [phone] 01/04/2014,11:57 AM

## 2014-01-04 NOTE — Progress Notes (Signed)
Very pleasant 68 yo female who had the onset of nausea, chest pain and diaphoresis on Tuesday. Symptoms persisted and became worse and she decided to come in to be evaluated. Troponin on admission was elevated to 15.74, BNP is elevated as well at 4617.  Creatinine is increased somewhat to 1.37. Agree with echocardiogram. She seems comfortable now, no chest pain. Plan for [facility] tomorrow.  I would be happy to follow her in the office - or if one of our [place] cardiologists is more convenient for her, that would be fine.  [name], MD, [facility] Attending Cardiologist [place]

## 2014-01-04 NOTE — H&P (Signed)
Patient ID: KENYATTA GRIBBEN MRN: [id number], DOB/AGE: 1946/03/21   Admit date: 01/03/2014   Primary Physician: Glo Herring., MD Primary Cardiologist: None  Pt. Profile:  41F with DM, HTN, prior stroke w/ no prior CAD history p/w 1 week of constant CP.  Problem List  Past Medical History  Diagnosis Date  . Diabetes mellitus   . Hypertension   . Stroke     tia  . Renal disorder     Past Surgical History  Procedure Laterality Date  . Replacement total knee  05-03-06  . Tonsillectomy  1972  . Esophagogastroduodenoscopy  11/08/2007     Normal esophagus without evidence of Barrett, mass, erosion/ Normal stomach, duodenal bulb  . Gastric emptying study  11/13/2007    mildly delayed emptying subjectively, but normal  analysis-77% of tracer emptied at 2 hours  . Laparoscopic appendectomy N/A 09/15/2012    Procedure: APPENDECTOMY LAPAROSCOPIC;  Surgeon: [name], MD;  Location: [facility];  Service: General;  Laterality: N/A;  . Joint replacement Right 05-03-06    Knee  . Appendectomy  March 2014    had appendix frozen     Allergies  Allergies  Allergen Reactions  . Penicillins Hives    HPI  41F with DM, HTN, prior stroke w/ no prior CAD history p/w 1 week of constant CP.  Ms. Raelyn Mora reprts she has had severe chest pain for most of the past week. She reports it felt like "pins in my chest" with associated abdominal discomfort and diaphoresis. She has had decreased PO intake over the past week as a result. Due to progressive pain she presented to the ED at [facility].   She was found to have a TnI of 15.7. ECG with NSSTTWC. She was given toradol, ativan, morphine, IV nitro, and IV UFH and transferred to [facility]. On arrival she reported 5/10 severity CP. Her IV nitro had been stopped due to hypotension.   She is a lifelong never smoker. She reports a remote stress test that was normal. No history of angiogram. She does have a strong family history. Her mother had a 1st MI at  87. Her 3 brothers and 1 sister all have CAD.   Home Medications  Prior to Admission medications   Medication Sig Start Date End Date Taking? Authorizing Provider  amitriptyline (ELAVIL) 25 MG tablet Take 25 mg by mouth at bedtime.  10/17/12  Yes [name], MD  amLODipine (NORVASC) 10 MG tablet Take 10 mg by mouth daily.   Yes Historical Provider, MD  aspirin EC 81 MG tablet Take 81 mg by mouth daily.   Yes Historical Provider, MD  atenolol (TENORMIN) 25 MG tablet Take 25 mg by mouth daily.   Yes Historical Provider, MD  Cholecalciferol (VITAMIN D-3) 1000 UNITS CAPS Take 1 capsule by mouth every morning.    Yes Historical Provider, MD  cloNIDine (CATAPRES) 0.1 MG tablet Take 0.1 mg by mouth 4 (four) times daily - after meals and at bedtime.    Yes Historical Provider, MD  colesevelam (WELCHOL) 625 MG tablet Take 1,875 mg by mouth 2 (two) times daily with a meal. *May take 6 tablets once a day with meals*   Yes Historical Provider, MD  furosemide (LASIX) 40 MG tablet Take 40 mg by mouth every morning.    Yes Historical Provider, MD  glimepiride (AMARYL) 1 MG tablet Take 1 mg by mouth daily. 11/27/13  Yes Historical Provider, MD  insulin aspart (NOVOLOG) 100 UNIT/ML injection  Inject 5-11 Units into the skin 3 (three) times daily before meals. Units vary   Yes Historical Provider, MD  insulin glargine (LANTUS) 100 UNIT/ML injection Inject 27 Units into the skin at bedtime.   Yes Historical Provider, MD  lisinopril (PRINIVIL,ZESTRIL) 20 MG tablet Take 20 mg by mouth daily. 09/08/13  Yes Historical Provider, MD  pravastatin (PRAVACHOL) 20 MG tablet Take 20 mg by mouth at bedtime. 10/10/13  Yes Historical Provider, MD    Family History  Family History  Problem Relation Age of Onset  . Hyperlipidemia Mother   . Hypertension Mother   . Heart attack Mother   . Cancer Father     lung  . Hypertension Sister   . Diabetes Brother   . Heart disease Brother   . Hyperlipidemia Brother   . Hypertension  Brother   . Heart attack Brother   . Other Brother     DVT    Social History  History   Social History  . Marital Status: Legally Separated    Spouse Name: N/A    Number of Children: N/A  . Years of Education: N/A   Occupational History  . Not on file.   Social History Main Topics  . Smoking status: Never Smoker   . Smokeless tobacco: Never Used  . Alcohol Use: No  . Drug Use: No  . Sexual Activity: Yes    Birth Control/ Protection: Post-menopausal   Other Topics Concern  . Not on file   Social History Narrative  . No narrative on file     Review of Systems General:  No chills, fever, night sweats or weight changes.  Cardiovascular:  + chest pain, no dyspnea on exertion, - edema, - orthopnea, - ppitations,  - paroxysmal nocturnal dyspnea. Dermatological: No rash, lesions/masses Respiratory: No cough, dyspnea Urologic: No hematuria, dysuria Abdominal:   No nausea, vomiting, diarrhea, bright red blood per rectum, melena, or hematemesis. + abd pain Neurologic:  No visual changes, wkns, changes in mental status. All other systems reviewed and are otherwise negative except as noted above.  Physical Exam  Blood pressure 111/46, pulse 57, temperature 98.3 F (36.8 C), temperature source Oral, resp. rate 21, height 5\' 7"  (1.702 m), weight 97.7 kg (215 lb 6.2 oz), SpO2 96.00%.  General: Pleasant, NAD Psych: Normal affect. Neuro: Alert and oriented X 3. Moves all extremities spontaneously. HEENT: Normal  Neck: Supple without bruits or JVD. Lungs:  Resp regular and unlabored, CTA. Heart: RRR no s3, s4, or murmurs. Abdomen: Soft, non-tender, non-distended, BS + x 4.  Extremities: No clubbing, cyanosis or edema. DP/PT/Radials 2+ and equal bilaterally.  Labs  Troponin [facility] of Care Test) No results found for this basename: TROPIPOC,  in the last 72 hours  Recent Labs  01/03/14 2058  TROPONINI 15.74*   Lab Results  Component Value Date   WBC 9.5 01/03/2014    HGB 12.4 01/03/2014   HCT 35.2* 01/03/2014   MCV 77.9* 01/03/2014   PLT 191 01/03/2014    Recent Labs Lab 01/03/14 2058  NA 136*  K 4.6  CL 99  CO2 25  BUN 21  CREATININE 1.25*  CALCIUM 10.0  PROT 6.9  BILITOT 0.4  ALKPHOS 97  ALT 24  AST 90*  GLUCOSE 258*   Lab Results  Component Value Date   CHOL  Value: 191        ATP III CLASSIFICATION:  <200     mg/dL   Desirable  200-239  mg/dL  Borderline High  >=240    mg/dL   High        07/28/2009   HDL 53 07/28/2009   LDLCALC  Value: 101        Total Cholesterol/HDL:CHD Risk Coronary Heart Disease Risk Table                     Men   Women  1/2 Average Risk   3.4   3.3  Average Risk       5.0   4.4  2 X Average Risk   9.6   7.1  3 X Average Risk  23.4   11.0        Use the calculated Patient Ratio above and the CHD Risk Table to determine the patient's CHD Risk.        ATP III CLASSIFICATION (LDL):  <100     mg/dL   Optimal  100-129  mg/dL   Near or Above                    Optimal  130-159  mg/dL   Borderline  160-189  mg/dL   High  >190     mg/dL   Very High* 07/28/2009   TRIG 187* 07/28/2009   No results found for this basename: DDIMER     Radiology/Studies  Dg Abd Acute W/chest  01/03/2014   CLINICAL DATA:  No acute finding.  Large volume of stool throughout the colon.  EXAM: ACUTE ABDOMEN SERIES (ABDOMEN 2 VIEW & CHEST 1 VIEW)  COMPARISON:  PA and lateral chest 10/12/2013. CT abdomen and pelvis 09/16/2012.  FINDINGS: Single view of the chest demonstrates clear lungs and normal heart size. No pneumothorax or pleural effusion is not identified.  Two views of the abdomen show no free intraperitoneal air. There is no evidence of bowel obstruction. Large volume of stool in the colon is seen. Small calcified uterine fibroid is noted. No other abnormal abdominal calcification is seen. Convex left lumbar scoliosis noted.  IMPRESSION: Negative abdominal radiographs.  No acute cardiopulmonary disease.   Electronically Signed   By: Inge Rise M.D.    On: 01/03/2014 20:56    ECG NSR. NSSTTWC  ASSESSMENT AND PLAN 69F with DM, HTN, prior stroke w/ no prior CAD history p/w 1 week of constant CP due to an acute coronary syndrome. She also has some mild hypotension that I suspect is related to hypovolemia rather than cardiogenic shock based on exam. Will treat for ACS and hold all BP meds that are poor antianginals to make room for nitro gtt. Will also give IV NS bolus to help with BP.   - asa 81, atorva 80mg , continue atenolol - UFH gtt - nitro gtt - TTE in AM - cath Monday - hold ace, clonidine, and lasix to get BP room for anti anginals   Signed, Lamar Sprinkles, MD 01/04/2014, 1:59 AM

## 2014-01-04 NOTE — Progress Notes (Signed)
ANTICOAGULATION CONSULT NOTE - Follow Up Consult  Pharmacy Consult for Heparin Indication: chest pain/ACS  Allergies  Allergen Reactions  . Penicillins Hives    Patient Measurements: Height: 5\' 7"  (170.2 cm) Weight: 219 lb (99.338 kg) IBW/kg (Calculated) : 61.6 Heparin Dosing Weight: 84 kg  Vital Signs: Temp: 98 F (36.7 C) (07/12 1935) Temp src: Oral (07/12 1935) BP: 129/50 mmHg (07/12 2000) Pulse Rate: 50 (07/12 1132)  Labs:  Recent Labs  01/03/14 2058 01/04/14 0535 01/04/14 1005 01/04/14 2100  HGB 12.4 11.3*  --   --   HCT 35.2* 33.1*  --   --   PLT 191 162  --   --   LABPROT  --  13.1  --   --   INR  --  0.99  --   --   HEPARINUNFRC  --   --  0.14* <0.10*  CREATININE 1.25* 1.37*  --   --   TROPONINI 15.74*  --   --   --     Estimated Creatinine Clearance: 46.9 ml/min (by C-G formula based on Cr of 1.37).  Assessment: 68 y/o female on heparin for NSTEMI. Initial heparin level was suptherapeutic at 0.14. Patient was given a bolus and rate increased. Heparin level is now undetectable at <0.1. Spoke with RN who reports no problems infusion. No bleeding noted. Planning cardiac cath on 7/13.  Goal of Therapy:  Heparin level 0.3-0.7 units/ml Monitor platelets by anticoagulation protocol: Yes   Plan:   Re-bolus with Heparin 3000 units IV x 1  Increase heparin drip to 1550 units/hr  Next heparin level in ~ 6 hrs  Daily heparin level and CBC while on heparin  [facility] [place].D., BCPS Clinical Pharmacist Pager: [phone] 01/04/2014 10:26 PM

## 2014-01-04 NOTE — Progress Notes (Signed)
ANTICOAGULATION CONSULT NOTE - Initial Consult  Pharmacy Consult for Heparin  Indication: chest pain/ACS  Allergies  Allergen Reactions  . Penicillins Hives    Patient Measurements: Height: 5\' 7"  (170.2 cm) Weight: 215 lb 6.2 oz (97.7 kg) IBW/kg (Calculated) : 61.6 Heparin Dosing Weight: ~84 kg  Vital Signs: Temp: 98.3 F (36.8 C) (07/12 0127) Temp src: Oral (07/12 0127) BP: 111/46 mmHg (07/12 0127) Pulse Rate: 57 (07/12 0127)  Labs:  Recent Labs  01/03/14 2058  HGB 12.4  HCT 35.2*  PLT 191  CREATININE 1.25*  TROPONINI 15.74*    Estimated Creatinine Clearance: 51 ml/min (by C-G formula based on Cr of 1.25).  Medical History: Past Medical History  Diagnosis Date  . Diabetes mellitus   . Hypertension   . Stroke     tia  . Renal disorder     Assessment: 68 y/o F to start heparin per pharmacy for NSTEMI. Troponin is 15.74, CBC good, renal function ok with SCr 1.25, other labs as above.   Goal of Therapy:  Heparin level 0.3-0.7 units/ml Monitor platelets by anticoagulation protocol: Yes   Plan:  -Already received 5000 unit heparin BOLUS  -Start heparin drip at 1000 units/hr -1000 HL -Daily CBC/HL -Monitor for bleeding  Narda Bonds 01/04/2014,2:31 AM

## 2014-01-05 ENCOUNTER — Encounter (HOSPITAL_COMMUNITY)
Admission: EM | Disposition: A | Discharge status: Home / Self Care | Payer: Self-pay | Source: Home / Self Care | Attending: Cardiology

## 2014-01-05 DIAGNOSIS — I2510 Atherosclerotic heart disease of native coronary artery without angina pectoris: Secondary | ICD-10-CM

## 2014-01-05 HISTORY — PX: LEFT HEART CATHETERIZATION WITH CORONARY ANGIOGRAM: SHX5451

## 2014-01-05 LAB — GLUCOSE, CAPILLARY
GLUCOSE-CAPILLARY: 278 mg/dL — AB (ref 70–99)
Glucose-Capillary: 260 mg/dL — ABNORMAL HIGH (ref 70–99)
Glucose-Capillary: 168 mg/dL — ABNORMAL HIGH (ref 70–99)
Glucose-Capillary: 87 mg/dL (ref 70–99)

## 2014-01-05 LAB — CBC
HEMATOCRIT: 35.7 % — AB (ref 36.0–46.0)
HEMOGLOBIN: 12.2 g/dL (ref 12.0–15.0)
MCH: 27 pg (ref 26.0–34.0)
MCHC: 34.2 g/dL (ref 30.0–36.0)
MCV: 79 fL (ref 78.0–100.0)
Platelets: 148 10*3/uL — ABNORMAL LOW (ref 150–400)
RBC: 4.52 MIL/uL (ref 3.87–5.11)
RDW: 14.2 % (ref 11.5–15.5)
WBC: 8.3 10*3/uL (ref 4.0–10.5)

## 2014-01-05 LAB — HEPARIN LEVEL (UNFRACTIONATED): Heparin Unfractionated: 0.71 IU/mL — ABNORMAL HIGH (ref 0.30–0.70)

## 2014-01-05 SURGERY — LEFT HEART CATHETERIZATION WITH CORONARY ANGIOGRAM
Anesthesia: LOCAL

## 2014-01-05 MED ORDER — NITROGLYCERIN 0.2 MG/ML ON CALL CATH LAB
INTRAVENOUS | Status: AC
  Filled 2014-01-05: qty 1

## 2014-01-05 MED ORDER — ONDANSETRON HCL 4 MG/2ML IJ SOLN: 4.0000 mg | Freq: Four times a day (QID) | INTRAMUSCULAR | Status: DC | PRN

## 2014-01-05 MED ORDER — HEPARIN SODIUM (PORCINE) 1000 UNIT/ML IJ SOLN
INTRAMUSCULAR | Status: AC
  Filled 2014-01-05: qty 1

## 2014-01-05 MED ORDER — ACETAMINOPHEN 325 MG PO TABS: 650.0000 mg | ORAL_TABLET | ORAL | Status: DC | PRN

## 2014-01-05 MED ORDER — MORPHINE SULFATE 2 MG/ML IJ SOLN: 1.0000 mg | INTRAMUSCULAR | Status: DC | PRN

## 2014-01-05 MED ORDER — SODIUM CHLORIDE 0.9 % IV SOLN: INTRAVENOUS | Status: AC

## 2014-01-05 MED ORDER — ASPIRIN 81 MG PO CHEW
81.0000 mg | CHEWABLE_TABLET | Freq: Every day | ORAL | Status: DC
  Administered 2014-01-06: 12:00:00 81 mg via ORAL
  Filled 2014-01-05: qty 1

## 2014-01-05 MED ORDER — LIDOCAINE HCL (PF) 1 % IJ SOLN
INTRAMUSCULAR | Status: AC
  Filled 2014-01-05: qty 30

## 2014-01-05 MED ORDER — VERAPAMIL HCL 2.5 MG/ML IV SOLN
INTRAVENOUS | Status: AC
  Filled 2014-01-05: qty 2

## 2014-01-05 MED ORDER — HEPARIN (PORCINE) IN NACL 2-0.9 UNIT/ML-% IJ SOLN
INTRAMUSCULAR | Status: AC
  Filled 2014-01-05: qty 1000

## 2014-01-05 NOTE — CV Procedure (Signed)
Cassandra Adams is a 68 y.o. female    [id number] LOCATION:  FACILITY: [facility]  PHYSICIAN: Quay Burow, M.D. Jun 09, 1946   DATE OF PROCEDURE:  01/05/2014  DATE OF DISCHARGE:     CARDIAC CATHETERIZATION     History obtained from chart review.[name] is a 68 year old female [name] admitted for non-STEMI. She has a history of hypertension and hyperlipidemia.her troponins went up to 15. She had no acute EKG changes. The echocardiogram was essentially normal. She's had no recurrent chest pain. She presents now for cardiac catheterization to define anatomy.   PROCEDURE DESCRIPTION:   The patient was brought to the second floor [facility] cath lab in the postabsorptive state. She was premedicated with Valium 5 mg by mouth. Her right wristwas prepped and shaved in usual sterile fashion. Xylocaine 1% was used  for local anesthesia. A 5 French sheath was inserted into the right radial artery using standard Seldinger technique. The patient received 5000 units  of heparin  intravenously.  A 5 [place] TIG catheter and pigtail catheters were used for selective coronary angiography and left ventriculography respectively. Omnipaque was used for the entirety of the case. Retrograde aortic, left ventricular pullback pressures were recorded.    HEMODYNAMICS:    AO SYSTOLIC/AO DIASTOLIC: 123XX123   LV SYSTOLIC/LV DIASTOLIC: [id number]  ANGIOGRAPHIC RESULTS:   1. Left main; normal  2. LAD; and a 50% segmental proximal with a focal area of 60-70% stenosis. 3. Left circumflex; nondominant and normal. There was a moderate size ramus branch that was normal as well.  4. Right coronary artery; comment occlusion in the midportion. This was the infarct-related artery. There was grade 3 left-to-right collaterals. 5. Left ventriculography; RAO left ventriculogram was performed using  25 mL of Visipaque dye at 12 mL/second. The overall LVEF estimated  60 %  With wall motion  abnormalities notable for subtle inferobasal hypokinesia  IMPRESSION:[name] has an occluded dominant RCA which was the infarct related artery and grade 3 left-to-right collaterals with normal function. She does have moderate proximal segmental LAD disease. At this point, given her lack of symptoms, preserved LV function with excellent collaterals migration this medical therapy. If she develops chest pain on medication she would be a candidate for PCI and stenting of her RCA. The sheath was removed and a TR band was placed on the right wrist to achieve patent hemostasis.  The patient left the lab in stable condition  [name]. MD, [facility] 01/05/2014 4:12 PM

## 2014-01-05 NOTE — Progress Notes (Signed)
ANTICOAGULATION CONSULT NOTE - Follow Up Consult  Pharmacy Consult for Heparin Indication: chest pain/ACS  Allergies  Allergen Reactions  . Penicillins Hives    Assessment: 68 y/o female on heparin for NSTEMI.  Heparin level = 0.71 CBC stable Cath planned for today  CBGs elevated, A1C=8.1  Goal of Therapy:  Heparin level 0.3-0.7 units/ml Monitor platelets by anticoagulation protocol: Yes   Plan:  Decrease heparin to 1350 units / hr Follow up after cath  Increase lantus if and when appropriate.     Labs:  Recent Labs  01/03/14 2058 01/04/14 0535 01/04/14 1005 01/04/14 2100 01/05/14 0353  HGB 12.4 11.3*  --   --  12.2  HCT 35.2* 33.1*  --   --  35.7*  PLT 191 162  --   --  148*  LABPROT  --  13.1  --   --   --   INR  --  0.99  --   --   --   HEPARINUNFRC  --   --  0.14* <0.10* 0.71*  CREATININE 1.25* 1.37*  --   --   --   TROPONINI 15.74*  --   --   --   --     Estimated Creatinine Clearance: 46.6 ml/min (by C-G formula based on Cr of 1.37).   Thank you. Anette Guarneri, PharmD [phone]  01/05/2014 8:32 AM

## 2014-01-05 NOTE — Progress Notes (Signed)
TR BAND REMOVAL  LOCATION:    right radial  DEFLATED PER PROTOCOL:    Yes.    TIME BAND OFF / DRESSING APPLIED:    2100   SITE UPON ARRIVAL:    Level 0  SITE AFTER BAND REMOVAL:    Level 0  REVERSE ALLEN'S TEST:     positive  CIRCULATION SENSATION AND MOVEMENT:    Within Normal Limits   Yes.    COMMENTS:

## 2014-01-05 NOTE — Interval H&P Note (Signed)
Cath Lab Visit (complete for each Cath Lab visit)  Clinical Evaluation Leading to the Procedure:   ACS: Yes.    Non-ACS:    Anginal Classification: CCS IV  Anti-ischemic medical therapy: No Therapy  Non-Invasive Test Results: No non-invasive testing performed  Prior CABG: No previous CABG      History and Physical Interval Note:  01/05/2014 3:33 PM  Cassandra Adams  has presented today for surgery, with the diagnosis of chest pain  The various methods of treatment have been discussed with the patient and family. After consideration of risks, benefits and other options for treatment, the patient has consented to  Procedure(s): LEFT HEART CATHETERIZATION WITH CORONARY ANGIOGRAM (N/A) as a surgical intervention .  The patient's history has been reviewed, patient examined, no change in status, stable for surgery.  I have reviewed the patient's chart and labs.  Questions were answered to the patient's satisfaction.     [name]

## 2014-01-05 NOTE — Care Management Note (Addendum)
    Page 1 of 1   01/06/2014     10:54:09 AM CARE MANAGEMENT NOTE 01/06/2014  Patient:  Cassandra Adams, Cassandra Adams   Account Number:  [id number]  Date Initiated:  01/05/2014  Documentation initiated by:  Elissa Hefty  Subjective/Objective Assessment:   adm w mi     Action/Plan:   lives alone, pcp dr Purcell Nails fusco   Anticipated DC Date:  01/06/2014   Anticipated DC Plan:  [place]  CM consult      Choice offered to / List presented to:             Status of service:   Medicare Important Message given?  YES (If response is "NO", the following Medicare IM given date fields will be blank) Date Medicare IM given:  01/06/2014 Medicare IM given by:  [facility] Date Additional Medicare IM given:   Additional Medicare IM given by:    Discharge Disposition:  HOME/SELF CARE  Per UR Regulation:  Reviewed for med. necessity/level of care/duration of stay  If discussed at [place] of Stay Meetings, dates discussed:    Comments:

## 2014-01-05 NOTE — H&P (View-Only) (Signed)
Very pleasant 68 yo female who had the onset of nausea, chest pain and diaphoresis on Tuesday. Symptoms persisted and became worse and she decided to come in to be evaluated. Troponin on admission was elevated to 15.74, BNP is elevated as well at 4617.  Creatinine is increased somewhat to 1.37. Agree with echocardiogram. She seems comfortable now, no chest pain. Plan for [facility] tomorrow.  I would be happy to follow her in the office - or if one of our [place] cardiologists is more convenient for her, that would be fine.  [name], MD, [facility] Attending Cardiologist [place]

## 2014-01-06 ENCOUNTER — Encounter (HOSPITAL_COMMUNITY): Payer: Self-pay | Admitting: General Practice

## 2014-01-06 ENCOUNTER — Telehealth: Payer: Self-pay | Admitting: Cardiology

## 2014-01-06 DIAGNOSIS — I214 Non-ST elevation (NSTEMI) myocardial infarction: Secondary | ICD-10-CM

## 2014-01-06 LAB — BASIC METABOLIC PANEL
ANION GAP: 12 (ref 5–15)
BUN: 11 mg/dL (ref 6–23)
CHLORIDE: 106 meq/L (ref 96–112)
CO2: 22 mEq/L (ref 19–32)
CREATININE: 1.11 mg/dL — AB (ref 0.50–1.10)
Calcium: 9.7 mg/dL (ref 8.4–10.5)
GFR, EST AFRICAN AMERICAN: 57 mL/min — AB (ref >90)
GFR, EST NON AFRICAN AMERICAN: 49 mL/min — AB (ref >90)
Glucose, Bld: 252 mg/dL — ABNORMAL HIGH (ref 70–99)
POTASSIUM: 4.6 meq/L (ref 3.7–5.3)
Sodium: 140 mEq/L (ref 137–147)

## 2014-01-06 LAB — GLUCOSE, CAPILLARY: Glucose-Capillary: 203 mg/dL — ABNORMAL HIGH (ref 70–99)

## 2014-01-06 MED ORDER — CLOPIDOGREL BISULFATE 75 MG PO TABS
75.0000 mg | ORAL_TABLET | Freq: Every day | ORAL | Status: DC
  Administered 2014-01-06: 75 mg via ORAL
  Filled 2014-01-06: qty 1

## 2014-01-06 MED ORDER — LISINOPRIL 20 MG PO TABS
20.0000 mg | ORAL_TABLET | Freq: Every day | ORAL | Status: DC
  Administered 2014-01-06: 12:00:00 20 mg via ORAL
  Filled 2014-01-06: qty 1

## 2014-01-06 MED ORDER — CLOPIDOGREL BISULFATE 75 MG PO TABS: 75.0000 mg | ORAL_TABLET | Freq: Every day | ORAL | Status: DC

## 2014-01-06 MED ORDER — NITROGLYCERIN 0.4 MG SL SUBL: 0.4000 mg | SUBLINGUAL_TABLET | SUBLINGUAL | Status: DC | PRN

## 2014-01-06 MED ORDER — FUROSEMIDE 40 MG PO TABS: 40.0000 mg | ORAL_TABLET | Freq: Every morning | ORAL | Status: DC

## 2014-01-06 MED ORDER — ATORVASTATIN CALCIUM 80 MG PO TABS: 80.0000 mg | ORAL_TABLET | Freq: Every evening | ORAL | Status: DC

## 2014-01-06 NOTE — Progress Notes (Signed)
Patient: Cassandra Adams / Admit Date: 01/03/2014 / Date of Encounter: 01/06/2014, 6:38 AM   Subjective: No CP or SOB. Feels good.    Objective: Telemetry: NSR Physical Exam: Blood pressure 153/57, pulse 65, temperature 98.6 F (37 C), temperature source Oral, resp. rate 20, height 5\' 7"  (1.702 m), weight 224 lb 6.9 oz (101.8 kg), SpO2 93.00%. General: Well developed, well nourished AAF in no acute distress. Laying flat in bed. Head: Normocephalic, atraumatic, sclera non-icteric, no xanthomas, nares are without discharge. Neck: Negative for carotid bruits. JVP not elevated. Lungs: Clear bilaterally to auscultation without wheezes, rales, or rhonchi. Breathing is unlabored. Heart: RRR S1 S2 without murmurs, rubs, or gallops.  Abdomen: Soft, non-tender, non-distended with normoactive bowel sounds. No rebound/guarding. Extremities: No clubbing or cyanosis. No edema. Distal pedal pulses are 2+ and equal bilaterally. R radial site without ecchymosis, hematoma, in tact pulses Neuro: Alert and oriented X 3. Moves all extremities spontaneously. Psych:  Responds to questions appropriately with a normal affect.   Intake/Output Summary (Last 24 hours) at 01/06/14 0638 Last data filed at 01/05/14 2308  Gross per 24 hour  Intake 1396.01 ml  Output   1850 ml  Net -453.99 ml    Inpatient Medications:  . amitriptyline  25 mg Oral QHS  . aspirin  81 mg Oral Daily  . atenolol  25 mg Oral Daily  . atorvastatin  80 mg Oral q1800  . colesevelam  1,875 mg Oral BID WC  . insulin aspart  0-15 Units Subcutaneous TID WC  . insulin aspart  0-5 Units Subcutaneous QHS  . insulin glargine  27 Units Subcutaneous QHS   Infusions:  . heparin Stopped (01/05/14 1652)    Labs:  Recent Labs  01/03/14 2058 01/04/14 0535  NA 136* 135*  K 4.6 4.7  CL 99 99  CO2 25 21  GLUCOSE 258* 249*  BUN 21 24*  CREATININE 1.25* 1.37*  CALCIUM 10.0 9.6    Recent Labs  01/03/14 2058  AST 90*  ALT 24    ALKPHOS 97  BILITOT 0.4  PROT 6.9  ALBUMIN 3.3*    Recent Labs  01/03/14 2058 01/04/14 0535 01/05/14 0353  WBC 9.5 9.4 8.3  NEUTROABS 7.4  --   --   HGB 12.4 11.3* 12.2  HCT 35.2* 33.1* 35.7*  MCV 77.9* 78.1 79.0  PLT 191 162 148*    Recent Labs  01/03/14 2058  TROPONINI 15.74*   No components found with this basename: POCBNP,   Recent Labs  01/04/14 0535  HGBA1C 8.1*     Radiology/Studies:  Dg Abd Acute W/chest  01/03/2014   CLINICAL DATA:  No acute finding.  Large volume of stool throughout the colon.  EXAM: ACUTE ABDOMEN SERIES (ABDOMEN 2 VIEW & CHEST 1 VIEW)  COMPARISON:  PA and lateral chest 10/12/2013. CT abdomen and pelvis 09/16/2012.  FINDINGS: Single view of the chest demonstrates clear lungs and normal heart size. No pneumothorax or pleural effusion is not identified.  Two views of the abdomen show no free intraperitoneal air. There is no evidence of bowel obstruction. Large volume of stool in the colon is seen. Small calcified uterine fibroid is noted. No other abnormal abdominal calcification is seen. Convex left lumbar scoliosis noted.  IMPRESSION: Negative abdominal radiographs.  No acute cardiopulmonary disease.   Electronically Signed   By: Inge Rise M.D.   On: 01/03/2014 20:56     Assessment and Plan  1. NSTEMI/CAD - occluded dominant RCA with L-R  collaterals, moderate prox segmental LAD disease, for medical therapy initially, EF 60% with subtle inferobasal hypokinesia 2. Diabetes mellitus 3. HTN 4. Prior stroke 5. CKD stage III  Needs BMET this AM given renal insufficiency - have asked nursing to call lab to come draw stat. Will discuss addition of Plavix with MD given NSTEMI. Continue aspirin, statin, BB.  Several meds held on admission to get BP room for anti-anginals - amlodipine, clonidine, Lasix, lisinopril - will discuss resumption of these with MD. Wants to followup in [place] because her daughter is here.  Signed, Melina Copa  PA-C  I saw evaluated the patient this morning along with Ms. Dayna Dunn, PA-C. She had a mild non-STEMI with what appears to be a chronically occluded RCA with brisk collaterals. Plan per Dr. Quay Burow who performed the catheterization was to optimize medical therapy. The patient is currently chest pain-free and is unable to rate without any significant symptoms. Exam is benign. Cath site is normal. She is stable for discharge today pending ambulation again. She will followup here in [facility] with cardiology. She will need to be restarted on her blood pressure medications so which were held during this hospitalization.  Leonie Man, M.D., M.S. Interventional Cardiologist   Pager # [phone] 01/06/2014

## 2014-01-06 NOTE — Progress Notes (Signed)
CARDIAC REHAB PHASE I   PRE:  Rate/Rhythm: 66 SR  BP:  Supine:   Sitting: 156/74  Standing:    SaO2:   MODE:  Ambulation: 350 ft   POST:  Rate/Rhythm: 80 SR  BP:  Supine:   Sitting: 139/69  Standing:    SaO2:  [id number] Pt walked 350 ft holding to side rail at times due to difficulty with left knee. She has had operation on right knee. Left knee was hurting by end of walk. No CP. To recliner. When educating pt, she rubbed neck. Stated that if she does not sleep in her recliner at home, she will have neck pain in morning and it feels better when she rubs it. Pt slept in bed last night. Denied that this was similar to discomfort she came in with. Discussed use of NTG tablets, risk factors, modified exercise due to knee problems. Pt has stationary bike at home that she said she can do 20 minutes on. Encouraged pt to start slowly on bike but to get back to riding as ex if she cannot tolerate walking. Discussed CRP 2 but pt declined due to no transportation.    Graylon Good, RN BSN  01/06/2014 9:09 AM

## 2014-01-06 NOTE — Discharge Summary (Signed)
Discharge Summary   Patient ID: NANAYAA LEDET MRN: [id number], DOB/AGE: May 11, 1946 68 y.o. Admit date: 01/03/2014 D/C date:     01/06/2014  Primary Care Provider: Glo Herring., MD Primary Cardiologist: New to [facility]  Primary Discharge Diagnoses:  1. NSTEMI/CAD - occluded dominant RCA with L-R collaterals, moderate prox segmental LAD disease, for medical therapy initially, EF 60% with subtle inferobasal hypokinesia  2. Diabetes mellitus, uncontrolled A1C 8.1 3. HTN  4. CKD stage III  Secondary Discharge Diagnoses:  1. Sickle cell trait 2. Migraines  3. Arthritis  Hospital Course: Ms. Mcguffie is a 68 y/o F with DM, HTN, prior stroke w/ no prior CAD history p/w 1 week of constant CP. This was associated with abdominal discomfort and diaphoresis. She has had decreased PO intake over the past week as a result. Due to progressive pain she presented to the ED at [facility] where she was found to have a troponin of 15.7 and EKG with nonspecific ST-T changes. She was given toradol, ativan, morphine, IV nitro, and IV UFH and transferred to [facility]. On arrival she reported 5/10 severity CP. IV NTG had to be stopped due to hypotension. Her home amlodipine, clonidine, lasix, lisinopril were held to allow BP room for antianginals. Abd/CXR showed no acute abnormalities. She was admitted and observed over the weekend with plan for cath on Monday. 2D Echo 01/04/14: EF 60-65%, no RWMA, mild MR, mildly dilated LA, PA press 19mmHg. Cath was performed yesterday showing 1. Left main; normal  2. LAD; and a 50% segmental proximal with a focal area of 60-70% stenosis.  3. Left circumflex; nondominant and normal. There was a moderate size ramus branch that was normal as well.  4. Right coronary artery; comment occlusion in the midportion. This was the infarct-related artery. There was grade 3 left-to-right collaterals.  5. Left ventriculography; RAO left ventriculogram was performed using  25 mL of Visipaque dye at 12  mL/second. The overall LVEF estimated  60 % With wall motion abnormalities notable for subtle inferobasal hypokinesia Her RCA was felt to be the infarct related artery, with grade 3 left-to-right collaterals with normal function. She does have moderate proximal segmental LAD disease. Dr. Gwenlyn Found recommended that at this point given lack of continued symptoms, preserved LV function and excellent collaterals, to treat her medically for now. If she develops chest pain on medication she would be a candidate for PCI and stenting of her RCA. This morning we have resumed her Lisinopril. Tomorrow we will restart Lasix. Per discussion with Dr. Ellyn Hack, will d/c clonidine and hold amlodipine for now as well. We can consider resuming amlodipine as an outpatient. The pt was instructed to call if BP running elevated outside the hospital. Today she feels well. Cr is stable post-cath. Plavix was added for NSTEMI. She ambulated with cardiac rehab without chest pain. BP slightly elevated but will have resumption of some meds as above. Dr. Ellyn Hack has seen and examined the patient today and feels she is stable for discharge. A1C was 8.2 - pt instructed to f/u PCP for further management. I have left a message on our office's scheduling voicemail requesting a follow-up appointment, and our office will call the patient with this appointment. Would consider repeat LFTs/lipids 8 weeks given statin titration.   Discharge Vitals: Blood pressure 156/74, pulse 66, temperature 98.7 F (37.1 C), temperature source Oral, resp. rate 18, height 5\' 7"  (1.702 m), weight 224 lb 6.9 oz (101.8 kg), SpO2 94.00%.  Labs: Lab Results  Component Value  Date   WBC 8.3 01/05/2014   HGB 12.2 01/05/2014   HCT 35.7* 01/05/2014   MCV 79.0 01/05/2014   PLT 148* 01/05/2014    Recent Labs Lab 01/03/14 2058  01/06/14 0629  NA 136*  < > 140  K 4.6  < > 4.6  CL 99  < > 106  CO2 25  < > 22  BUN 21  < > 11  CREATININE 1.25*  < > 1.11*  CALCIUM 10.0  < >  9.7  PROT 6.9  --   --   BILITOT 0.4  --   --   ALKPHOS 97  --   --   ALT 24  --   --   AST 90*  --   --   GLUCOSE 258*  < > 252*  < > = values in this interval not displayed.  Recent Labs  01/03/14 2058  TROPONINI 15.74*   Lab Results  Component Value Date   CHOL 148 01/04/2014   HDL 56 01/04/2014   LDLCALC 73 01/04/2014   TRIG 97 01/04/2014    Diagnostic Studies/Procedures   Dg Abd Acute W/chest 01/03/2014   CLINICAL DATA:  No acute finding.  Large volume of stool throughout the colon.  EXAM: ACUTE ABDOMEN SERIES (ABDOMEN 2 VIEW & CHEST 1 VIEW)  COMPARISON:  PA and lateral chest 10/12/2013. CT abdomen and pelvis 09/16/2012.  FINDINGS: Single view of the chest demonstrates clear lungs and normal heart size. No pneumothorax or pleural effusion is not identified.  Two views of the abdomen show no free intraperitoneal air. There is no evidence of bowel obstruction. Large volume of stool in the colon is seen. Small calcified uterine fibroid is noted. No other abnormal abdominal calcification is seen. Convex left lumbar scoliosis noted.  IMPRESSION: Negative abdominal radiographs.  No acute cardiopulmonary disease.   Electronically Signed   By: Inge Rise M.D.   On: 01/03/2014 20:56   Cath 01/05/14 CARDIAC CATHETERIZATION  History obtained from chart review.Ms. Laliberte is a 68 year old female Mr. [facility] admitted for non-STEMI. She has a history of hypertension and hyperlipidemia.her troponins went up to 15. She had no acute EKG changes. The echocardiogram was essentially normal. She's had no recurrent chest pain. She presents now for cardiac catheterization to define anatomy.  PROCEDURE DESCRIPTION:  The patient was brought to the second floor [facility]iac cath lab in the postabsorptive state. She was premedicated with Valium 5 mg by mouth. Her right wristwas prepped and shaved in usual sterile fashion. Xylocaine 1% was used  for local anesthesia. A 5 French sheath was  inserted into the right radial artery using standard Seldinger technique. The patient received 5000 units of heparin intravenously. A 5 [place] TIG catheter and pigtail catheters were used for selective coronary angiography and left ventriculography respectively. Omnipaque was used for the entirety of the case. Retrograde aortic, left ventricular pullback pressures were recorded.  HEMODYNAMICS:  AO SYSTOLIC/AO DIASTOLIC: 123XX123  LV SYSTOLIC/LV DIASTOLIC: [id number]  ANGIOGRAPHIC RESULTS:  1. Left main; normal  2. LAD; and a 50% segmental proximal with a focal area of 60-70% stenosis.  3. Left circumflex; nondominant and normal. There was a moderate size ramus branch that was normal as well.  4. Right coronary artery; comment occlusion in the midportion. This was the infarct-related artery. There was grade 3 left-to-right collaterals.  5. Left ventriculography; RAO left ventriculogram was performed using  25 mL of Visipaque dye at 12 mL/second. The overall LVEF estimated  60 % With wall motion abnormalities notable for subtle inferobasal hypokinesia  IMPRESSION:Ms. Muhlbach has an occluded dominant RCA which was the infarct related artery and grade 3 left-to-right collaterals with normal function. She does have moderate proximal segmental LAD disease. At this point, given her lack of symptoms, preserved LV function with excellent collaterals migration this medical therapy. If she develops chest pain on medication she would be a candidate for PCI and stenting of her RCA. The sheath was removed and a TR band was placed on the right wrist to achieve patent hemostasis. The patient left the lab in stable condition  [name]. MD, [facility]  01/05/2014  4:12 PM   Discharge Medications   Current Discharge Medication List    START taking these medications   Details  atorvastatin (LIPITOR) 80 MG tablet Take 1 tablet (80 mg total) by mouth every evening. Qty: 30 tablet, Refills: 6    clopidogrel  (PLAVIX) 75 MG tablet Take 1 tablet (75 mg total) by mouth daily. Qty: 30 tablet, Refills: 11    nitroGLYCERIN (NITROSTAT) 0.4 MG SL tablet Place 1 tablet (0.4 mg total) under the tongue every 5 (five) minutes as needed for chest pain (up to 3 doses). Qty: 25 tablet, Refills: 3      CONTINUE these medications which have CHANGED   Details  furosemide (LASIX) 40 MG tablet Take 1 tablet (40 mg total) by mouth every morning. Resume 01/07/14      CONTINUE these medications which have NOT CHANGED   Details  amitriptyline (ELAVIL) 25 MG tablet Take 25 mg by mouth at bedtime.     aspirin EC 81 MG tablet Take 81 mg by mouth daily.    atenolol (TENORMIN) 25 MG tablet Take 25 mg by mouth daily.    Cholecalciferol (VITAMIN D-3) 1000 UNITS CAPS Take 1 capsule by mouth every morning.     colesevelam (WELCHOL) 625 MG tablet Take 1,875 mg by mouth 2 (two) times daily with a meal. *May take 6 tablets once a day with meals*    glimepiride (AMARYL) 1 MG tablet Take 1 mg by mouth daily.    insulin aspart (NOVOLOG) 100 UNIT/ML injection Inject 5-11 Units into the skin 3 (three) times daily before meals. Units vary    insulin glargine (LANTUS) 100 UNIT/ML injection Inject 27 Units into the skin at bedtime.    lisinopril (PRINIVIL,ZESTRIL) 20 MG tablet Take 20 mg by mouth daily.      STOP taking these medications     amLODipine (NORVASC) 10 MG tablet      cloNIDine (CATAPRES) 0.1 MG tablet      pravastatin (PRAVACHOL) 20 MG tablet         Disposition   The patient will be discharged in stable condition to home. Discharge Instructions   Diet - low sodium heart healthy    Complete by:  As directed   Diabetic Diet     Increase activity slowly    Complete by:  As directed   No driving for 1 week. No lifting over 10 lbs for 2 weeks. No sexual activity for 2 weeks. You may return to work in 1 week if applicable, with the above restrictions. Keep procedure site clean & dry. If you notice  increased pain, swelling, bleeding or pus, call/return!  You may shower, but no soaking baths/hot tubs/pools for 1 week.   Please monitor your blood pressure occasionally at home. Call your doctor if you tend to get readings of greater than 130  on the top number or 80 on the bottom number.          Follow-up Information   Follow up with [name], MD. (Our office will call you for a follow-up appointment. Please call the office if you have not heard from [place] within 3 days.)    Specialty:  Cardiology   Contact information:   [address] [place] [address] [phone]       Follow up with Glo Herring., MD. (Your A1C was too high (8.2) showing that you need better control of your diabetes. Please follow up with your primary care doctor to discuss.)    Specialty:  Internal Medicine   Contact information:   [place] [id number] [phone]         Duration of Discharge Encounter: Greater than 30 minutes including physician and PA time.  Signed, Dayna Dunn PA-C 01/06/2014, 9:26 AM  I saw and evaluated the patient this morning. Please see final progress note for full details. I agree with the discharge summary.  Leonie Man, M.D., M.S. Interventional Cardiologist   Pager # [phone] 01/06/2014

## 2014-01-07 NOTE — Telephone Encounter (Signed)
Closed encounter °

## 2014-01-14 ENCOUNTER — Ambulatory Visit (INDEPENDENT_AMBULATORY_CARE_PROVIDER_SITE_OTHER): Payer: Medicare Other | Admitting: Cardiology

## 2014-01-14 VITALS — BP 152/67 | HR 57 | Ht 66.0 in | Wt 213.0 lb

## 2014-01-14 DIAGNOSIS — I10 Essential (primary) hypertension: Secondary | ICD-10-CM

## 2014-01-14 MED ORDER — AMLODIPINE BESYLATE 10 MG PO TABS: 5.0000 mg | ORAL_TABLET | Freq: Every day | ORAL | Status: DC

## 2014-01-14 NOTE — Progress Notes (Signed)
Patient ID: Cassandra Adams, female   DOB: Aug 31, 1945, 68 y.o.   MRN: [id number]    01/14/2014 [name]   [birth year]  [id number]  Primary Physicia Glo Herring., MD Primary Cardiologist: Dr. Debara Pickett  [name] presents to clinic today for post hospital followup after recent admission for non-ST elevation myocardial infarction. Details regarding her history and recent hospital course are outlined in detail below.  HPI:  The patient is a 68 year old African American female with a history of diabetes, hypertension, prior stroke but no prior history of CAD, who initially presented to [facility] on 01/03/2014 with a complaint of chest pain. Initial troponin was elevated at 15.7 and EKG was with nonspecific ST-T changes. She was placed on IV heparin and IV nitroglycerin and was transferred to [facility] for further treatment. Her chest pain resolved. She underwent a diagnostic left heart catheterization, perform by Dr. Gwenlyn Found which demonstrated occlusion of the midportion of the RCA, which was felt to be the infarct-related artery. However, she had grade 3 left right collaterals. She also has moderate LAD artery disease with 50% segmental stenosis in the proximal portion with a focal area of 60-70% stenosis. The left main and left circumflex were both normal. Left ventricular systolic function was normal with an estimated ejection fraction of 60%. Wall motion abnormalities were notable for sublte inferior basal hypokinesia. Given her lack of ongoing symptoms and preserved LV function with excellent collaterals, it was recommended to treat with medical therapy. It was felt that if she were to develop recurrent chest pain then she would be a candidate for PCI and stenting of her RCA. She left the Cath Lab in stable condition and had no post-cath complications and no recurrent chest pain. She was placed on dual antiplatelet therapy with aspirin plus Plavix. She was also  placed on a beta blocker, an ACE inhibitor and statin. Also notable this admission, was a hemoglobin A1c level of 8.1. She was instructed to followup with her PCP for better management of her diabetes.  She was discharged home on 01/06/2014.  She presents to clinic today for post hospital followup. She is accompanied by her daughter. She denies any recurrent anginal symptoms. She also denies dyspnea, syncope/near-syncope. Yesterday however, she did note feeling a sensation of increased warmth and mild dizziness, that was self-limiting. This lasted only a minute or 2. Again, she had no syncope/near-syncope. She denies any further recurrence. She has been fully compliant with her medications.   Current Outpatient Prescriptions  Medication Sig Dispense Refill  . amitriptyline (ELAVIL) 25 MG tablet Take 25 mg by mouth at bedtime.       [name] amLODipine (NORVASC) 10 MG tablet Take 0.5 tablets (5 mg total) by mouth daily.  30 tablet  5  . aspirin EC 81 MG tablet Take 81 mg by mouth daily.      [name] atenolol (TENORMIN) 25 MG tablet Take 25 mg by mouth daily.      [name] atorvastatin (LIPITOR) 80 MG tablet Take 1 tablet (80 mg total) by mouth every evening.  30 tablet  6  . Cholecalciferol (VITAMIN D-3) 1000 UNITS CAPS Take 1 capsule by mouth every morning.       . clopidogrel (PLAVIX) 75 MG tablet Take 1 tablet (75 mg total) by mouth daily.  30 tablet  11  . clotrimazole-betamethasone (LOTRISONE) cream Apply 1 application topically 3 (three) times daily.      . colesevelam (WELCHOL) 625 MG tablet Take 1,875 mg  by mouth 2 (two) times daily with a meal. *May take 6 tablets once a day with meals*      . furosemide (LASIX) 40 MG tablet Take 1 tablet (40 mg total) by mouth every morning.      [name] glimepiride (AMARYL) 1 MG tablet Take 1 mg by mouth daily.      . insulin aspart (NOVOLOG) 100 UNIT/ML injection Inject 5-11 Units into the skin 3 (three) times daily before meals. Units vary      . insulin glargine (LANTUS) 100  UNIT/ML injection Inject 27 Units into the skin at bedtime.      [name] lisinopril (PRINIVIL,ZESTRIL) 20 MG tablet Take 20 mg by mouth daily.      . nitroGLYCERIN (NITROSTAT) 0.4 MG SL tablet Place 1 tablet (0.4 mg total) under the tongue every 5 (five) minutes as needed for chest pain (up to 3 doses).  25 tablet  3   No current facility-administered medications for this visit.    Allergies  Allergen Reactions  . Penicillins Hives    History   Social History  . Marital Status: Legally Separated    Spouse Name: N/A    Number of Children: N/A  . Years of Education: N/A   Occupational History  . Not on file.   Social History Main Topics  . Smoking status: Never Smoker   . Smokeless tobacco: Never Used  . Alcohol Use: No  . Drug Use: No  . Sexual Activity: Not Currently    Birth Control/ Protection: Post-menopausal   Other Topics Concern  . Not on file   Social History Narrative  . No narrative on file     Review of Systems: General: negative for chills, fever, night sweats or weight changes.  Cardiovascular: negative for chest pain, dyspnea on exertion, edema, orthopnea, palpitations, paroxysmal nocturnal dyspnea or shortness of breath Dermatological: negative for rash Respiratory: negative for cough or wheezing Urologic: negative for hematuria Abdominal: negative for nausea, vomiting, diarrhea, bright red blood per rectum, melena, or hematemesis Neurologic: negative for visual changes, syncope, or dizziness All other systems reviewed and are otherwise negative except as noted above.    Blood pressure 152/67, pulse 57, height 5\' 6"  (1.676 m), weight 213 lb (96.616 kg).  General appearance: alert, cooperative and no distress Neck: no carotid bruit and no JVD Lungs: clear to auscultation bilaterally Heart: regular rate and rhythm, S1, S2 normal, no murmur, click, rub or gallop Extremities: no LEE Pulses: 2+ and symmetric Skin: warm and dry Neurologic: Grossly  normal  EKG NSR 58 bpm  ASSESSMENT AND PLAN:   1. CAD: Recent left heart catheterization in the setting of non-ST elevation MI revealed occlusion of her RCA with good collaterals as well as moderate LAD disease. Medical therapy was recommended. She has been on dual antiplatelet therapy with aspirin plus Plavix as well as a beta blocker, ACE inhibitor and statin. She denies any recurrent anginal symptoms. We will continue her on her current medical regimen. However it was noted in her cath report, that if she had recurrent anginal pain to consider her for PCI plus stenting of her RCA. She has been instructed to notify our office if she develops any recurrent anginal symptoms.  2. Hypertension: Blood pressure moderately elevated in the Q000111Q systolic. She's been checking her blood pressure at home and notes persistent hypertension with systolic pressures in Q000111Q and 160s. We are limited by upward titration of her beta blocker due to mild bradycardia. We will restart  her on her home dose of amlodipine. She has been instructed to continue to monitor her blood pressure closely at home.  3. Diabetes: Poorly controlled. Her most recent hemoglobin A1c is 8.1. She is on several hypoglycemics. She is scheduled to followup with her PCP for better management. She was educated that her goal hemoglobin A1c should be less than 7.  4. Statin therapy: She is currently on Lipitor in light of her recent non-ST elevation myocardial infarction. Her most recent lipid panel surprisingly looks really good with an LDL of 73 and HDL of 59. Triglycerides are also well controlled at 97. She appears to be tolerating statin therapy well without complaints of myalgias. We will keep her on Lipitor until she is seen and evaluated by her primary cardiologist, Dr. Debara Pickett.    PLAN  Continue current plan of care with aspirin, Plavix, beta blocker, ACE inhibitor and statin. Will add amlodipine for better blood pressure control. She's been  instructed to followup with Dr. Debara Pickett in 2-3 months for reassessment or sooner if she develops recurrent anginal symptoms.  SIMMONS, BRITTAINYPA-C 01/14/2014 5:42 PM

## 2014-01-14 NOTE — Patient Instructions (Signed)
Your physician recommends that you schedule a follow-up appointment in: 6-8 weeks with Dr Debara Pickett  Your physician has recommended you make the following change in your medication: Start Amlodipine 5 mg daily, if blood pressure is 140 and above increase to 10 mg daily

## 2014-01-16 ENCOUNTER — Encounter: Payer: Self-pay | Admitting: Cardiology

## 2014-01-23 ENCOUNTER — Other Ambulatory Visit (HOSPITAL_COMMUNITY): Payer: Self-pay | Admitting: Family Medicine

## 2014-01-23 DIAGNOSIS — R52 Pain, unspecified: Secondary | ICD-10-CM

## 2014-01-24 ENCOUNTER — Ambulatory Visit (HOSPITAL_COMMUNITY)
Admission: RE | Admit: 2014-01-24 | Discharge: 2014-01-24 | Disposition: A | Discharge status: Home / Self Care | Payer: Medicare Other | Source: Ambulatory Visit | Attending: Family Medicine | Admitting: Family Medicine

## 2014-01-24 DIAGNOSIS — R52 Pain, unspecified: Secondary | ICD-10-CM

## 2014-01-24 DIAGNOSIS — M25559 Pain in unspecified hip: Secondary | ICD-10-CM | POA: Insufficient documentation

## 2014-02-26 ENCOUNTER — Encounter: Payer: Self-pay | Admitting: Internal Medicine

## 2014-02-26 ENCOUNTER — Ambulatory Visit (INDEPENDENT_AMBULATORY_CARE_PROVIDER_SITE_OTHER): Payer: Medicare Other | Admitting: Internal Medicine

## 2014-02-26 VITALS — BP 122/64 | HR 98 | Ht 66.0 in | Wt 214.8 lb

## 2014-02-26 DIAGNOSIS — E119 Type 2 diabetes mellitus without complications: Secondary | ICD-10-CM

## 2014-02-26 DIAGNOSIS — I214 Non-ST elevation (NSTEMI) myocardial infarction: Secondary | ICD-10-CM

## 2014-02-26 DIAGNOSIS — I10 Essential (primary) hypertension: Secondary | ICD-10-CM

## 2014-02-26 DIAGNOSIS — I6523 Occlusion and stenosis of bilateral carotid arteries: Secondary | ICD-10-CM

## 2014-02-26 DIAGNOSIS — I480 Paroxysmal atrial fibrillation: Secondary | ICD-10-CM | POA: Insufficient documentation

## 2014-02-26 DIAGNOSIS — I4819 Other persistent atrial fibrillation: Secondary | ICD-10-CM

## 2014-02-26 DIAGNOSIS — R5381 Other malaise: Secondary | ICD-10-CM

## 2014-02-26 DIAGNOSIS — Z79899 Other long term (current) drug therapy: Secondary | ICD-10-CM

## 2014-02-26 DIAGNOSIS — I2510 Atherosclerotic heart disease of native coronary artery without angina pectoris: Secondary | ICD-10-CM

## 2014-02-26 DIAGNOSIS — I658 Occlusion and stenosis of other precerebral arteries: Secondary | ICD-10-CM

## 2014-02-26 DIAGNOSIS — R5383 Other fatigue: Secondary | ICD-10-CM

## 2014-02-26 DIAGNOSIS — I6529 Occlusion and stenosis of unspecified carotid artery: Secondary | ICD-10-CM

## 2014-02-26 DIAGNOSIS — I2583 Coronary atherosclerosis due to lipid rich plaque: Secondary | ICD-10-CM

## 2014-02-26 DIAGNOSIS — D689 Coagulation defect, unspecified: Secondary | ICD-10-CM

## 2014-02-26 DIAGNOSIS — I4891 Unspecified atrial fibrillation: Secondary | ICD-10-CM

## 2014-02-26 MED ORDER — RIVAROXABAN 20 MG PO TABS: 20.0000 mg | ORAL_TABLET | Freq: Every day | ORAL | Status: DC

## 2014-02-26 NOTE — Progress Notes (Signed)
Patient ID: Cassandra Adams, female   DOB: 25-Jul-1945, 68 y.o.   MRN: [id number]    02/26/2014 LUCA SANANGELO   69/08/1945  [id number]  Primary Physicia Glo Herring., MD Primary Cardiologist: Dr. Debara Pickett  Cassandra Adams presents to clinic today for post hospital followup after recent admission for non-ST elevation myocardial infarction. Details regarding her history and recent hospital course are outlined in detail below.  HPI:  The patient is a 68 year old African American female with a history of diabetes, hypertension, prior stroke but no prior history of CAD, who initially presented to [facility] on 01/03/2014 with a complaint of chest pain. Initial troponin was elevated at 15.7 and EKG was with nonspecific ST-T changes. She was placed on IV heparin and IV nitroglycerin and was transferred to [facility] for further treatment. Her chest pain resolved. She underwent a diagnostic left heart catheterization, perform by Dr. Gwenlyn Found which demonstrated occlusion of the midportion of the RCA, which was felt to be the infarct-related artery. However, she had grade 3 left right collaterals. She also has moderate LAD artery disease with 50% segmental stenosis in the proximal portion with a focal area of 60-70% stenosis. The left main and left circumflex were both normal. Left ventricular systolic function was normal with an estimated ejection fraction of 60%. Wall motion abnormalities were notable for sublte inferior basal hypokinesia. Given her lack of ongoing symptoms and preserved LV function with excellent collaterals, it was recommended to treat with medical therapy. It was felt that if she were to develop recurrent chest pain then she would be a candidate for PCI and stenting of her RCA. She left the Cath Lab in stable condition and had no post-cath complications and no recurrent chest pain. She was placed on dual antiplatelet therapy with aspirin plus Plavix. She was also placed  on a beta blocker, an ACE inhibitor and statin. Also notable this admission, was a hemoglobin A1c level of 8.1. She was instructed to followup with her PCP for better management of her diabetes.  She was discharged home on 01/06/2014.  She presents to clinic today for post hospital followup. She is accompanied by her daughter. She denies any recurrent anginal symptoms. She also denies dyspnea, syncope/near-syncope. Yesterday however, she did note feeling a sensation of increased warmth and mild dizziness, that was self-limiting. This lasted only a minute or 2. Again, she had no syncope/near-syncope. She denies any further recurrence. She has been fully compliant with her medications.  Cassandra Adams is seen in followup today. She recently saw Ellen Henri, PA-C., Who started her on amlodipine for better blood pressure control. Her pressure is better controlled today however it is noted that her EKG demonstrates atrial fibrillation which is new onset. She is reportedly completely unaware of this. She denies any chest pain, worsening fatigue or shortness of breath.   Current Outpatient Prescriptions  Medication Sig Dispense Refill  . amitriptyline (ELAVIL) 25 MG tablet Take 25 mg by mouth at bedtime.       [name] amLODipine (NORVASC) 10 MG tablet Take 0.5 tablets (5 mg total) by mouth daily.  30 tablet  5  . aspirin EC 81 MG tablet Take 81 mg by mouth daily.      [name] atenolol (TENORMIN) 25 MG tablet Take 25 mg by mouth daily.      [name] atorvastatin (LIPITOR) 80 MG tablet Take 1 tablet (80 mg total) by mouth every evening.  30 tablet  6  . Cholecalciferol (VITAMIN D-3) 1000  UNITS CAPS Take 1 capsule by mouth every morning.       . clopidogrel (PLAVIX) 75 MG tablet Take 1 tablet (75 mg total) by mouth daily.  30 tablet  11  . clotrimazole-betamethasone (LOTRISONE) cream Apply 1 application topically 3 (three) times daily.      . colesevelam (WELCHOL) 625 MG tablet Take 1,875 mg by mouth 2 (two) times daily  with a meal. *May take 6 tablets once a day with meals*      . furosemide (LASIX) 40 MG tablet Take 1 tablet (40 mg total) by mouth every morning.      [name] glimepiride (AMARYL) 1 MG tablet Take 1 mg by mouth daily.      . insulin aspart (NOVOLOG) 100 UNIT/ML injection Inject 5-11 Units into the skin 3 (three) times daily before meals. Units vary      . insulin glargine (LANTUS) 100 UNIT/ML injection Inject 27 Units into the skin at bedtime.      [name] lisinopril (PRINIVIL,ZESTRIL) 20 MG tablet Take 20 mg by mouth daily.      . nitroGLYCERIN (NITROSTAT) 0.4 MG SL tablet Place 1 tablet (0.4 mg total) under the tongue every 5 (five) minutes as needed for chest pain (up to 3 doses).  25 tablet  3  . rivaroxaban (XARELTO) 20 MG TABS tablet Take 1 tablet (20 mg total) by mouth daily with supper.  25 tablet  0   No current facility-administered medications for this visit.    Allergies  Allergen Reactions  . Penicillins Hives    History   Social History  . Marital Status: Legally Separated    Spouse Name: N/A    Number of Children: N/A  . Years of Education: N/A   Occupational History  . Not on file.   Social History Main Topics  . Smoking status: Never Smoker   . Smokeless tobacco: Never Used  . Alcohol Use: No  . Drug Use: No  . Sexual Activity: Not Currently    Birth Control/ Protection: Post-menopausal   Other Topics Concern  . Not on file   Social History Narrative  . No narrative on file     Review of Systems: General: negative for chills, fever, night sweats or weight changes.  Cardiovascular: negative for chest pain, dyspnea on exertion, edema, orthopnea, palpitations, paroxysmal nocturnal dyspnea or shortness of breath Dermatological: negative for rash Respiratory: negative for cough or wheezing Urologic: negative for hematuria Abdominal: negative for nausea, vomiting, diarrhea, bright red blood per rectum, melena, or hematemesis Neurologic: negative for visual changes,  syncope, or dizziness All other systems reviewed and are otherwise negative except as noted above.    Blood pressure 122/64, pulse 98, height 5\' 6"  (1.676 m), weight 214 lb 12.8 oz (97.433 kg).  General appearance: alert, cooperative and no distress Neck: no carotid bruit and no JVD Lungs: clear to auscultation bilaterally Heart: irregularly irregular rhythm Extremities: no LEE Pulses: 2+ and symmetric Skin: warm and dry Neurologic: Grossly normal  EKG Atrial fibrillation with controlled ventricular response at 98, inferior infarct pattern  ASSESSMENT AND PLAN:  Cassandra Adams returns today and has better blood pressure control and amlodipine however she is in atrial fibrillation. She is completely unaware of this. The issue is that she had recent MI and is on aspirin and Plavix. Fortunately she did not have a stent placed because she had an occluded RCA with collaterals. Therefore she is not committed to antiplatelet therapy. Based on this new onset atrial fibrillation,  I would recommend discontinuing Plavix and continuing aspirin 81 mg daily. I will add Xarelto 20 mg daily and schedule her for TEE cardioversion. When auscultating her there was a brief period of regularity and she may have paroxysmal A. fib. We'll certainly see how she is on the day of her procedure. If she is paroxysmal he may not need to treat her other than anticoagulation.  [name], MD, [facility] Attending Cardiologist [facility]  Janya Eveland C 02/26/2014 9:55 AM

## 2014-02-26 NOTE — Patient Instructions (Signed)
Your physician has requested that you have a TEE/Cardioversion. During a TEE, sound waves are used to create images of your heart. It provides your doctor with information about the size and shape of your heart and how well your heart's chambers and valves are working. In this test, a transducer is attached to the end of a flexible tube that is guided down you throat and into your esophagus (the tube leading from your mouth to your stomach) to get a more detailed image of your heart. Once the TEE has determined that a blood clot is not present, the cardioversion begins. Electrical Cardioversion uses a jolt of electricity to your heart either through paddles or wired patches attached to your chest. This is a controlled, usually prescheduled, procedure. This procedure is done at the hospital and you are not awake during the procedure. You usually go home the day of the procedure. Please see the instruction sheet given to you today for more information.  You will have to have blood work done prior to your procedure. The scheduler will let you know when you need to have this done.  Dr Debara Pickett has recommended making the following medication changes:  STOP Plavix (Clopidogrel)  CONTINUE Aspirin  START Xarelto 20 mg - take 1 tablet daily

## 2014-03-03 ENCOUNTER — Encounter (HOSPITAL_COMMUNITY): Payer: Self-pay

## 2014-03-12 LAB — CBC
HCT: 35.7 % — ABNORMAL LOW (ref 36.0–46.0)
Hemoglobin: 12.3 g/dL (ref 12.0–15.0)
MCH: 26.9 pg (ref 26.0–34.0)
MCHC: 34.5 g/dL (ref 30.0–36.0)
MCV: 78.1 fL (ref 78.0–100.0)
PLATELETS: 211 10*3/uL (ref 150–400)
RBC: 4.57 MIL/uL (ref 3.87–5.11)
RDW: 15.7 % — ABNORMAL HIGH (ref 11.5–15.5)
WBC: 5.1 10*3/uL (ref 4.0–10.5)

## 2014-03-12 LAB — APTT: APTT: 34 s (ref 24–37)

## 2014-03-12 LAB — BASIC METABOLIC PANEL
BUN: 18 mg/dL (ref 6–23)
CHLORIDE: 110 meq/L (ref 96–112)
CO2: 25 mEq/L (ref 19–32)
CREATININE: 1.18 mg/dL — AB (ref 0.50–1.10)
Calcium: 9.9 mg/dL (ref 8.4–10.5)
Glucose, Bld: 86 mg/dL (ref 70–99)
Potassium: 5 mEq/L (ref 3.5–5.3)
Sodium: 140 mEq/L (ref 135–145)

## 2014-03-12 LAB — PROTIME-INR
INR: 1.52 — ABNORMAL HIGH (ref <1.50)
Prothrombin Time: 18.3 seconds — ABNORMAL HIGH (ref 11.6–15.2)

## 2014-03-13 LAB — TSH: TSH: 0.874 u[IU]/mL (ref 0.350–4.500)

## 2014-03-17 ENCOUNTER — Telehealth: Payer: Self-pay | Admitting: *Deleted

## 2014-03-17 ENCOUNTER — Encounter (HOSPITAL_COMMUNITY): Payer: Self-pay

## 2014-03-17 ENCOUNTER — Encounter (HOSPITAL_COMMUNITY): Payer: Self-pay | Admitting: Anesthesiology

## 2014-03-17 ENCOUNTER — Ambulatory Visit (HOSPITAL_COMMUNITY)
Admission: RE | Admit: 2014-03-17 | Discharge: 2014-03-17 | Disposition: A | Discharge status: Home / Self Care | Payer: Medicare Other | Source: Ambulatory Visit | Attending: Internal Medicine | Admitting: Internal Medicine

## 2014-03-17 ENCOUNTER — Encounter (HOSPITAL_COMMUNITY)
Admission: RE | Disposition: A | Discharge status: Home / Self Care | Payer: Self-pay | Source: Ambulatory Visit | Attending: Internal Medicine

## 2014-03-17 DIAGNOSIS — Z5309 Procedure and treatment not carried out because of other contraindication: Secondary | ICD-10-CM | POA: Insufficient documentation

## 2014-03-17 DIAGNOSIS — I4891 Unspecified atrial fibrillation: Secondary | ICD-10-CM | POA: Insufficient documentation

## 2014-03-17 DIAGNOSIS — I498 Other specified cardiac arrhythmias: Secondary | ICD-10-CM | POA: Diagnosis not present

## 2014-03-17 HISTORY — PX: TEE WITHOUT CARDIOVERSION: SHX5443

## 2014-03-17 HISTORY — PX: CARDIOVERSION: SHX1299

## 2014-03-17 SURGERY — ECHOCARDIOGRAM, TRANSESOPHAGEAL
Anesthesia: Monitor Anesthesia Care

## 2014-03-17 MED ORDER — SODIUM CHLORIDE 0.9 % IV SOLN: INTRAVENOUS | Status: DC

## 2014-03-17 MED ORDER — RIVAROXABAN 20 MG PO TABS: 20.0000 mg | ORAL_TABLET | Freq: Every day | ORAL | Status: DC

## 2014-03-17 NOTE — Telephone Encounter (Signed)
Left VM for patient that she is scheduled to see Dr. Debara Pickett 10/2 at 2:45pm. Asked that she call back to confirm she got message and inform us if this appointment date and time works for her.

## 2014-03-17 NOTE — Anesthesia Preprocedure Evaluation (Deleted)
Anesthesia Evaluation    Airway       Dental   Pulmonary          Cardiovascular hypertension, + CAD and + Past MI + dysrhythmias Atrial Fibrillation     Neuro/Psych    GI/Hepatic   Endo/Other  diabetesHyperthyroidism   Renal/GU      Musculoskeletal   Abdominal   Peds  Hematology   Anesthesia Other Findings   Reproductive/Obstetrics                           Anesthesia Physical Anesthesia Plan Anesthesia Quick Evaluation

## 2014-03-17 NOTE — CV Procedure (Signed)
Mrs. Arguello presented today for cardioversion, however, she was found to be in sinus bradycardia. As I suspected, she likely has PAF for which she is unaware. Given her CHADSVASC score of 3, I would recommend staying on Xarelto and Aspirin (recent CAD with POBA).  The procedure was cancelled and she can be discharged home. Follow-up with me in the office.  [name], MD, [facility] Attending Cardiologist [place]

## 2014-03-17 NOTE — H&P (Signed)
     INTERVAL PROCEDURE H&P  History and Physical Interval Note:  03/17/2014  Cassandra Adams has presented today for their planned procedure. The various methods of treatment have been discussed with the patient and family. After consideration of risks, benefits and other options for treatment, the patient has consented to the procedure.  [name] outpatient history has been reviewed, patient examined, and no change in status from most recent office note within the past 30 days. I have reviewed the patients' chart and labs and will proceed as planned. Questions were answered to the patient's satisfaction.   Cassandra Casino, MD, [facility] Attending Cardiologist [facility]  Cassandra Adams C

## 2014-03-18 ENCOUNTER — Encounter (HOSPITAL_COMMUNITY): Payer: Self-pay | Admitting: Internal Medicine

## 2014-03-23 ENCOUNTER — Telehealth: Payer: Self-pay | Admitting: Internal Medicine

## 2014-03-23 MED ORDER — RIVAROXABAN 20 MG PO TABS: 20.0000 mg | ORAL_TABLET | Freq: Every day | ORAL | Status: DC

## 2014-03-23 NOTE — Telephone Encounter (Signed)
Script sent into the pharm

## 2014-03-23 NOTE — Telephone Encounter (Signed)
Need a prescription for her Xarelto 20 mg. Please call to [place].

## 2014-03-25 ENCOUNTER — Telehealth: Payer: Self-pay | Admitting: Internal Medicine

## 2014-03-25 NOTE — Telephone Encounter (Signed)
Faxed prior authorization form for xarelto 20mg  QD to [facility] [phone]

## 2014-03-25 NOTE — Telephone Encounter (Signed)
Xarelto 20mg  PO QD approved through 03/26/2015 Left message for patient with this information.

## 2014-03-25 NOTE — Telephone Encounter (Signed)
Notified pharmacy of medication approval.

## 2014-03-25 NOTE — Telephone Encounter (Signed)
Pt say the pharmacist says she needs prior authorization for her Xarelto. Please call to [place].

## 2014-03-27 ENCOUNTER — Ambulatory Visit: Payer: Medicare Other | Admitting: Internal Medicine

## 2014-04-23 ENCOUNTER — Ambulatory Visit (INDEPENDENT_AMBULATORY_CARE_PROVIDER_SITE_OTHER): Payer: Medicare Other | Admitting: Internal Medicine

## 2014-04-23 ENCOUNTER — Encounter: Payer: Self-pay | Admitting: Internal Medicine

## 2014-04-23 VITALS — BP 116/61 | HR 54 | Ht 66.0 in | Wt 211.6 lb

## 2014-04-23 DIAGNOSIS — I480 Paroxysmal atrial fibrillation: Secondary | ICD-10-CM

## 2014-04-23 DIAGNOSIS — I10 Essential (primary) hypertension: Secondary | ICD-10-CM

## 2014-04-23 DIAGNOSIS — I2510 Atherosclerotic heart disease of native coronary artery without angina pectoris: Secondary | ICD-10-CM

## 2014-04-23 DIAGNOSIS — I2583 Coronary atherosclerosis due to lipid rich plaque: Secondary | ICD-10-CM

## 2014-04-23 NOTE — Patient Instructions (Signed)
Your physician wants you to follow-up in: 6 months with Dr. Hilty. You will receive a reminder letter in the mail two months in advance. If you don't receive a letter, please call our office to schedule the follow-up appointment.    

## 2014-04-23 NOTE — Progress Notes (Signed)
Patient ID: Cassandra Adams, female   DOB: [birth year], 68 y.o.   MRN: [id number]    04/23/2014 Cassandra Adams   12/04/1945  [id number]  Primary Physicia Cassandra Adams., MD Primary Cardiologist: Dr. Debara Adams  Cassandra Adams presents to clinic today for post hospital followup after recent admission for non-ST elevation myocardial infarction. Details regarding her history and recent hospital course are outlined in detail below.  HPI:  The patient is a 67 year old African American female with a history of diabetes, hypertension, prior stroke but no prior history of CAD, who initially presented to [facility] on 01/03/2014 with a complaint of chest pain. Initial troponin was elevated at 15.7 and EKG was with nonspecific ST-T changes. She was placed on IV heparin and IV nitroglycerin and was transferred to [facility] for further treatment. Her chest pain resolved. She underwent a diagnostic left heart catheterization, perform by Dr. Gwenlyn Found which demonstrated occlusion of the midportion of the RCA, which was felt to be the infarct-related artery. However, she had grade 3 left right collaterals. She also has moderate LAD artery disease with 50% segmental stenosis in the proximal portion with a focal area of 60-70% stenosis. The left main and left circumflex were both normal. Left ventricular systolic function was normal with an estimated ejection fraction of 60%. Wall motion abnormalities were notable for sublte inferior basal hypokinesia. Given her lack of ongoing symptoms and preserved LV function with excellent collaterals, it was recommended to treat with medical therapy. It was felt that if she were to develop recurrent chest pain then she would be a candidate for PCI and stenting of her RCA. She left the Cath Lab in stable condition and had no post-cath complications and no recurrent chest pain. She was placed on dual antiplatelet therapy with aspirin plus Plavix. She was also  placed on a beta blocker, an ACE inhibitor and statin. Also notable this admission, was a hemoglobin A1c level of 8.1. She was instructed to followup with her PCP for better management of her diabetes.  She was discharged home on 01/06/2014.  She presents to clinic today for post hospital followup. She is accompanied by her daughter. She denies any recurrent anginal symptoms. She also denies dyspnea, syncope/near-syncope. Yesterday however, she did note feeling a sensation of increased warmth and mild dizziness, that was self-limiting. This lasted only a minute or 2. Again, she had no syncope/near-syncope. She denies any further recurrence. She has been fully compliant with her medications.  Cassandra Adams is seen in followup today. I had arranged for cardioversion, however, when she arrived at the hospital she was noted to be in sinus rhythm. She remained asymptomatic and is in sinus rhythm today.  Current Outpatient Prescriptions  Medication Sig Dispense Refill  . amitriptyline (ELAVIL) 25 MG tablet Take 25 mg by mouth at bedtime.       [name] amLODipine (NORVASC) 10 MG tablet Take 5 mg by mouth daily.      [name] aspirin 81 MG chewable tablet Chew 81 mg by mouth daily.      [name] atenolol (TENORMIN) 25 MG tablet Take 25 mg by mouth daily.      [name] atorvastatin (LIPITOR) 80 MG tablet Take 1 tablet (80 mg total) by mouth every evening.  30 tablet  6  . Cholecalciferol (VITAMIN D-3) 1000 UNITS CAPS Take 1,000 Units by mouth every morning.       . clopidogrel (PLAVIX) 75 MG tablet Take 1 tablet (75 mg total) by mouth daily.  30 tablet  11  . clotrimazole-betamethasone (LOTRISONE) cream Apply 1 application topically 3 (three) times daily.      . colesevelam (WELCHOL) 625 MG tablet Take 1,875 mg by mouth 2 (two) times daily with a meal. *May take 6 tablets once a day with meals*      . furosemide (LASIX) 40 MG tablet Take 1 tablet (40 mg total) by mouth every morning.      [name] glimepiride (AMARYL) 1 MG tablet Take 1 mg  by mouth daily with breakfast.       . insulin aspart (NOVOLOG) 100 UNIT/ML injection Inject 2-5 Units into the skin 3 (three) times daily with meals. SSI      . insulin glargine (LANTUS) 100 UNIT/ML injection Inject 27 Units into the skin at bedtime.      [name] lisinopril (PRINIVIL,ZESTRIL) 20 MG tablet Take 10 mg by mouth daily.       . nitroGLYCERIN (NITROSTAT) 0.4 MG SL tablet Place 1 tablet (0.4 mg total) under the tongue every 5 (five) minutes as needed for chest pain (up to 3 doses).  25 tablet  3  . rivaroxaban (XARELTO) 20 MG TABS tablet Take 1 tablet (20 mg total) by mouth daily with supper.  30 tablet  5   No current facility-administered medications for this visit.    Allergies  Allergen Reactions  . Penicillins Hives    History   Social History  . Marital Status: Legally Separated    Spouse Name: N/A    Number of Children: N/A  . Years of Education: N/A   Occupational History  . Not on file.   Social History Main Topics  . Smoking status: Never Smoker   . Smokeless tobacco: Never Used  . Alcohol Use: No  . Drug Use: No  . Sexual Activity: Not Currently    Birth Control/ Protection: Post-menopausal   Other Topics Concern  . Not on file   Social History Narrative  . No narrative on file     Review of Systems: General: negative for chills, fever, night sweats or weight changes.  Cardiovascular: negative for chest pain, dyspnea on exertion, edema, orthopnea, palpitations, paroxysmal nocturnal dyspnea or shortness of breath Dermatological: negative for rash Respiratory: negative for cough or wheezing Urologic: negative for hematuria Abdominal: negative for nausea, vomiting, diarrhea, bright red blood per rectum, melena, or hematemesis Neurologic: negative for visual changes, syncope, or dizziness All other systems reviewed and are otherwise negative except as noted above.    Blood pressure 116/61, pulse 54, height 5\' 6"  (1.676 m), weight 211 lb 9.6 oz (95.981  kg).  deferred  EKG Sinus bradycardia at 53, occasional PVC's  ASSESSMENT AND PLAN:  Cassandra Adams is doing well. She sponatenously converted back to NSR. She is bradycardic, but asymptomatic. BP is good today and she denies positional dizziness. I would recommend continuing her current low dose of atenolol. Plan to see her back in 6 months.  [name], MD, [facility] Attending Cardiologist [facility]  HILTY,Kenneth C 04/23/2014 8:35 AM

## 2014-04-27 ENCOUNTER — Encounter: Payer: Self-pay | Admitting: Internal Medicine

## 2014-04-28 ENCOUNTER — Telehealth: Payer: Self-pay | Admitting: Internal Medicine

## 2014-04-28 NOTE — Telephone Encounter (Signed)
Attempted to call- no answer

## 2014-04-28 NOTE — Telephone Encounter (Signed)
Pt called in regards to her tongue has been bleeding for the past 2 days and wanted to let  Dr. Debara Pickett know. Please call  Thanks

## 2014-04-28 NOTE — Telephone Encounter (Signed)
Patient reports her tongue has been so sore for 3 days and then yesterday around 2am she woke up with her tongue bleeding (a lot) and was bright red. She states that around 3am today she woke with same issue and it lingered til around 6am. She is not coughing up blood and has no other bleeding issues. She takes asa and xarelto.   Deferred to Erasmo Downer to advise

## 2014-04-28 NOTE — Telephone Encounter (Signed)
LM with family member Dionne Milo to have patient return call

## 2014-04-28 NOTE — Telephone Encounter (Signed)
Would recommend holding Xarelto x 2 days, then resume at normal dose.  She may have a cut on her tongue and they tend to bleed quite a bit.

## 2014-04-28 NOTE — Telephone Encounter (Signed)
Patient instructed to hold xarelto tonite and tomorrow and restart on Thursday 11/5

## 2014-05-04 ENCOUNTER — Telehealth: Payer: Self-pay | Admitting: Internal Medicine

## 2014-05-04 NOTE — Telephone Encounter (Signed)
Please call,pt have been coughing up blood. Pt is on Xarelto.

## 2014-05-04 NOTE — Telephone Encounter (Signed)
I spoke with the pt's daughter and the pt is still bleeding from her mouth.  I asked if the pt held Xarelto for two days and the daughter said no. She states that the pt was confused about the instructions she got last week and that the daughter should be called with any instructions.  The pt stopped one of her BP medications. I made her aware that the pt needs to hold Xarelto for 2 days and then resume, restart BP medication. The pt's daughter will call the office if she continues to have bleeding issues.

## 2014-05-12 ENCOUNTER — Telehealth: Payer: Self-pay | Admitting: Internal Medicine

## 2014-05-12 NOTE — Telephone Encounter (Signed)
See the last 2 telephone message.  Forward to Dr Hilty/Kristin Alvstad pharm-d

## 2014-05-12 NOTE — Telephone Encounter (Signed)
Dr. Debara Pickett - Do we want to switch pt to Eliquis for trial run?    Cassandra Adams

## 2014-05-12 NOTE — Telephone Encounter (Signed)
Cassandra Adams is calling because her mom was Xarelto and Dr. Debara Pickett told her to stop taking it for two and then to start again to see if see still has the same symptoms . She is still having the same symptoms. Please call the the work number for [place] which [phone].   Thanks

## 2014-05-13 NOTE — Telephone Encounter (Signed)
Spoke with both patient and son.  Pt states no bleeding in mouth for several days, but tongue still sore, painful to move.  She passed phone to her son, who stated that she does not have a dentist, as she has dentures.  Advised that they go to PCP for evaluation. Son voiced understanding.  Will leave patient on Xarelto for now, son is to call if bleeding is a further concern.

## 2014-05-13 NOTE — Telephone Encounter (Signed)
I'm okay with that .[name] Can you make the switch? I would advise she see a dentist or oral surgeon for a good mouth exam as well, to r/o oral cancers, gingival disease, etc.  Dr. Lemmie Evens

## 2014-06-04 ENCOUNTER — Encounter (HOSPITAL_COMMUNITY): Payer: Self-pay | Admitting: Cardiovascular Disease

## 2014-08-19 ENCOUNTER — Other Ambulatory Visit: Payer: Self-pay

## 2014-08-19 MED ORDER — ATORVASTATIN CALCIUM 80 MG PO TABS: 80.0000 mg | ORAL_TABLET | Freq: Every evening | ORAL | Status: DC

## 2014-08-19 NOTE — Telephone Encounter (Signed)
atorvastatin (LIPITOR) 80 MG tablet Take 1 tablet (80 mg total) by mouth every evening [name], MD at 04/23/2014 8:35 AM

## 2014-08-21 ENCOUNTER — Other Ambulatory Visit: Payer: Self-pay

## 2014-09-07 DIAGNOSIS — R809 Proteinuria, unspecified: Secondary | ICD-10-CM | POA: Diagnosis not present

## 2014-09-07 DIAGNOSIS — D649 Anemia, unspecified: Secondary | ICD-10-CM | POA: Diagnosis not present

## 2014-09-07 DIAGNOSIS — E559 Vitamin D deficiency, unspecified: Secondary | ICD-10-CM | POA: Diagnosis not present

## 2014-09-07 DIAGNOSIS — I10 Essential (primary) hypertension: Secondary | ICD-10-CM | POA: Diagnosis not present

## 2014-09-07 DIAGNOSIS — N183 Chronic kidney disease, stage 3 (moderate): Secondary | ICD-10-CM | POA: Diagnosis not present

## 2014-09-09 DIAGNOSIS — E559 Vitamin D deficiency, unspecified: Secondary | ICD-10-CM | POA: Diagnosis not present

## 2014-09-09 DIAGNOSIS — N183 Chronic kidney disease, stage 3 (moderate): Secondary | ICD-10-CM | POA: Diagnosis not present

## 2014-09-09 DIAGNOSIS — I10 Essential (primary) hypertension: Secondary | ICD-10-CM | POA: Diagnosis not present

## 2014-09-09 DIAGNOSIS — R809 Proteinuria, unspecified: Secondary | ICD-10-CM | POA: Diagnosis not present

## 2014-09-09 DIAGNOSIS — E875 Hyperkalemia: Secondary | ICD-10-CM | POA: Diagnosis not present

## 2014-10-20 ENCOUNTER — Telehealth: Payer: Self-pay | Admitting: *Deleted

## 2014-10-20 ENCOUNTER — Ambulatory Visit (INDEPENDENT_AMBULATORY_CARE_PROVIDER_SITE_OTHER): Payer: Medicare Other | Admitting: Internal Medicine

## 2014-10-20 ENCOUNTER — Encounter: Payer: Self-pay | Admitting: Internal Medicine

## 2014-10-20 VITALS — BP 150/68 | HR 62 | Ht 66.0 in | Wt 201.0 lb

## 2014-10-20 DIAGNOSIS — I10 Essential (primary) hypertension: Secondary | ICD-10-CM | POA: Diagnosis not present

## 2014-10-20 DIAGNOSIS — I480 Paroxysmal atrial fibrillation: Secondary | ICD-10-CM

## 2014-10-20 DIAGNOSIS — I2510 Atherosclerotic heart disease of native coronary artery without angina pectoris: Secondary | ICD-10-CM | POA: Diagnosis not present

## 2014-10-20 DIAGNOSIS — I2583 Coronary atherosclerosis due to lipid rich plaque: Secondary | ICD-10-CM

## 2014-10-20 MED ORDER — APIXABAN 5 MG PO TABS: 5.0000 mg | ORAL_TABLET | Freq: Two times a day (BID) | ORAL | Status: DC

## 2014-10-20 NOTE — Progress Notes (Signed)
Patient ID: Cassandra Adams, female   DOB: 01-Dec-1945, 69 y.o.   MRN: [id number]    10/20/2014 Cassandra Adams   06/07/1946  [id number]  Primary Physicia Cassandra Adams., MD Primary Cardiologist: Dr. Debara Adams  Cassandra Adams presents to clinic today for post hospital followup after recent admission for non-ST elevation myocardial infarction. Details regarding her history and recent hospital course are outlined in detail below.  HPI:  The patient is a 69 year old African American female with a history of diabetes, hypertension, prior stroke but no prior history of CAD, who initially presented to [facility] on 01/03/2014 with a complaint of chest pain. Initial troponin was elevated at 15.7 and EKG was with nonspecific ST-T changes. She was placed on IV heparin and IV nitroglycerin and was transferred to [facility] for further treatment. Her chest pain resolved. She underwent a diagnostic left heart catheterization, perform by Dr. Gwenlyn Adams which demonstrated occlusion of the midportion of the RCA, which was felt to be the infarct-related artery. However, she had grade 3 left right collaterals. She also has moderate LAD artery disease with 50% segmental stenosis in the proximal portion with a focal area of 60-70% stenosis. The left main and left circumflex were both normal. Left ventricular systolic function was normal with an estimated ejection fraction of 60%. Wall motion abnormalities were notable for sublte inferior basal hypokinesia. Given her lack of ongoing symptoms and preserved LV function with excellent collaterals, it was recommended to treat with medical therapy. It was felt that if she were to develop recurrent chest pain then she would be a candidate for PCI and stenting of her RCA. She left the Cath Lab in stable condition and had no post-cath complications and no recurrent chest pain. She was placed on dual antiplatelet therapy with aspirin plus Plavix. She was also  placed on a beta blocker, an ACE inhibitor and statin. Also notable this admission, was a hemoglobin A1c level of 8.1. She was instructed to followup with her PCP for better management of her diabetes.  She was discharged home on 01/06/2014.  She presents to clinic today for post hospital followup. She is accompanied by her daughter. She denies any recurrent anginal symptoms. She also denies dyspnea, syncope/near-syncope. Yesterday however, she did note feeling a sensation of increased warmth and mild dizziness, that was self-limiting. This lasted only a minute or 2. Again, she had no syncope/near-syncope. She denies any further recurrence. She has been fully compliant with her medications.  Cassandra Adams is seen in followup today. Her EKG demonstrates a sinus rhythm today. However I suspect that she continues to have PAF. Unfortunate she took herself off of [place] 2 after experiencing some oral bleeding. She did not notify the office and has not been on any anticoagulation for several months. We discussed her higher than normal risk of stroke, based on a CHADSVASC score of 5. She reports only one episode of angina in January, however it was very short-lived and sharp pain. She took a nitroglycerin and the symptoms went away after 10 or 15 minutes, suggesting this may not eventually been angina.  Current Outpatient Prescriptions  Medication Sig Dispense Refill  . amitriptyline (ELAVIL) 25 MG tablet Take 25 mg by mouth at bedtime.     [name] amLODipine (NORVASC) 10 MG tablet Take 5 mg by mouth daily.    [name] aspirin 81 MG chewable tablet Chew 81 mg by mouth daily.    [name] atenolol (TENORMIN) 25 MG tablet Take 25 mg by  mouth daily.    [name] atorvastatin (LIPITOR) 80 MG tablet Take 1 tablet (80 mg total) by mouth every evening. 30 tablet 6  . Cholecalciferol (VITAMIN D-3) 1000 UNITS CAPS Take 1,000 Units by mouth every morning.     . clopidogrel (PLAVIX) 75 MG tablet Take 1 tablet (75 mg total) by mouth daily. 30  tablet 11  . clotrimazole-betamethasone (LOTRISONE) cream Apply 1 application topically 3 (three) times daily.    . colesevelam (WELCHOL) 625 MG tablet Take 1,875 mg by mouth 2 (two) times daily with a meal. *May take 6 tablets once a day with meals*    . furosemide (LASIX) 40 MG tablet Take 1 tablet (40 mg total) by mouth every morning.    [name] glimepiride (AMARYL) 2 MG tablet Take 2 mg by mouth daily.    . insulin aspart (NOVOLOG) 100 UNIT/ML injection Inject 2-5 Units into the skin 3 (three) times daily with meals. SSI    . insulin glargine (LANTUS) 100 UNIT/ML injection Inject 27 Units into the skin at bedtime.    [name] lisinopril (PRINIVIL,ZESTRIL) 20 MG tablet Take 10 mg by mouth daily.     . nitroGLYCERIN (NITROSTAT) 0.4 MG SL tablet Place 1 tablet (0.4 mg total) under the tongue every 5 (five) minutes as needed for chest pain (up to 3 doses). 25 tablet 3  . apixaban (ELIQUIS) 5 MG TABS tablet Take 1 tablet (5 mg total) by mouth 2 (two) times daily. 60 tablet 6   No current facility-administered medications for this visit.    Allergies  Allergen Reactions  . Penicillins Hives    History   Social History  . Marital Status: Legally Separated    Spouse Name: N/A  . Number of Children: N/A  . Years of Education: N/A   Occupational History  . Not on file.   Social History Main Topics  . Smoking status: Never Smoker   . Smokeless tobacco: Never Used  . Alcohol Use: No  . Drug Use: No  . Sexual Activity: Not Currently    Birth Control/ Protection: Post-menopausal   Other Topics Concern  . Not on file   Social History Narrative     Review of Systems: General: negative for chills, fever, night sweats or weight changes.  Cardiovascular: negative for chest pain, dyspnea on exertion, edema, orthopnea, palpitations, paroxysmal nocturnal dyspnea or shortness of breath Dermatological: negative for rash Respiratory: negative for cough or wheezing Urologic: negative for  hematuria Abdominal: negative for nausea, vomiting, diarrhea, bright red blood per rectum, melena, or hematemesis Neurologic: negative for visual changes, syncope, or dizziness All other systems reviewed and are otherwise negative except as noted above.    Blood pressure 150/68, pulse 62, height 5\' 6"  (1.676 m), weight 201 lb (91.173 kg).  General appearance: alert and no distress Neck: no carotid bruit, no JVD and thyroid not enlarged, symmetric, no tenderness/mass/nodules Lungs: clear to auscultation bilaterally Heart: regular rate and rhythm, S1, S2 normal, no murmur, click, rub or gallop Abdomen: soft, non-tender; bowel sounds normal; no masses,  no organomegaly Extremities: extremities normal, atraumatic, no cyanosis or edema Pulses: 2+ and symmetric Skin: Skin color, texture, turgor normal. No rashes or lesions Neurologic: Grossly normal Psych: Pleasant  EKG Sinus rhythm with PACs at 62, nonspecific T wave changes  ASSESSMENT AND PLAN:  Mrs. Leider is feeling well. She had one episode of questionable angina. She's currently on a good medical regimen. Unfortunate she was on aspirin and Plavix and was on Xarelto and  had some oral bleeding.  It seems that she did not have a stent placed with her recent NSTEMI, therefore I recommend decreasing her anticoagulation. Will place her on Eliquis 5 mg twice daily and aspirin 81 mg daily. She is advised to stop Plavix 75 mg daily. Plan to see her back in 6 months.  [name], MD, [facility] Attending Cardiologist [facility]  Delaney Schnick C 10/20/2014 10:10 AM

## 2014-10-20 NOTE — Telephone Encounter (Signed)
Faxed PA for eliquis 5mg  BID to [facility]

## 2014-10-20 NOTE — Patient Instructions (Addendum)
Your physician has recommended you make the following change in your medication...  1. STOP clopidogrel (plavix) 2. START eliquis 5mg  twice daily (blood thinner)  - #84 [id number] Exp: 10/2016   Your physician wants you to follow-up in: 6 months with Dr. Debara Pickett. You will receive a reminder letter in the mail two months in advance. If you don't receive a letter, please call our office to schedule the follow-up appointment.

## 2014-10-21 NOTE — Telephone Encounter (Signed)
Eliquis 5mg  BID approved until 10/19/2015 under Medicare Part D

## 2014-10-30 DIAGNOSIS — I10 Essential (primary) hypertension: Secondary | ICD-10-CM | POA: Diagnosis not present

## 2014-10-30 DIAGNOSIS — I872 Venous insufficiency (chronic) (peripheral): Secondary | ICD-10-CM | POA: Diagnosis not present

## 2014-10-30 DIAGNOSIS — R600 Localized edema: Secondary | ICD-10-CM | POA: Diagnosis not present

## 2014-10-30 DIAGNOSIS — E669 Obesity, unspecified: Secondary | ICD-10-CM | POA: Diagnosis not present

## 2014-10-30 DIAGNOSIS — E1140 Type 2 diabetes mellitus with diabetic neuropathy, unspecified: Secondary | ICD-10-CM | POA: Diagnosis not present

## 2014-11-27 ENCOUNTER — Ambulatory Visit: Payer: Medicare Other | Admitting: Family

## 2014-11-27 ENCOUNTER — Encounter (HOSPITAL_COMMUNITY): Payer: Medicare Other

## 2015-02-11 IMAGING — CR DG HIP (WITH OR WITHOUT PELVIS) 2-3V*L*
3 series · 3 of 3 positions shown · non-contrast
Comparison: 10/09/2007

CLINICAL DATA: Left hip pain, no trauma

EXAM:
LEFT HIP - COMPLETE 2+ VIEW

[view not recorded (1 of 3)]
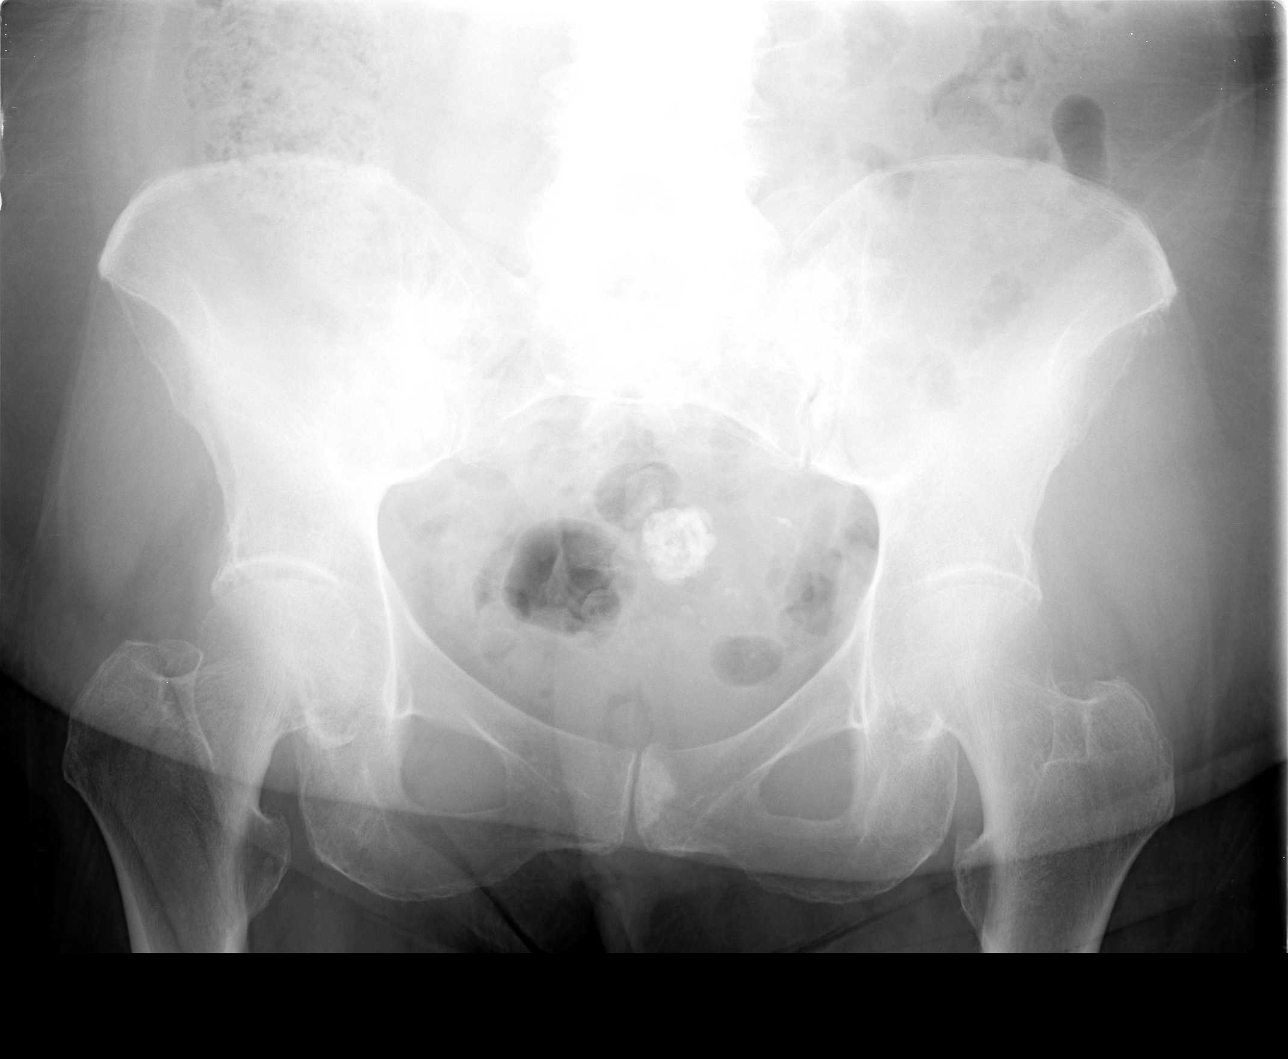

[view not recorded (2 of 3)]
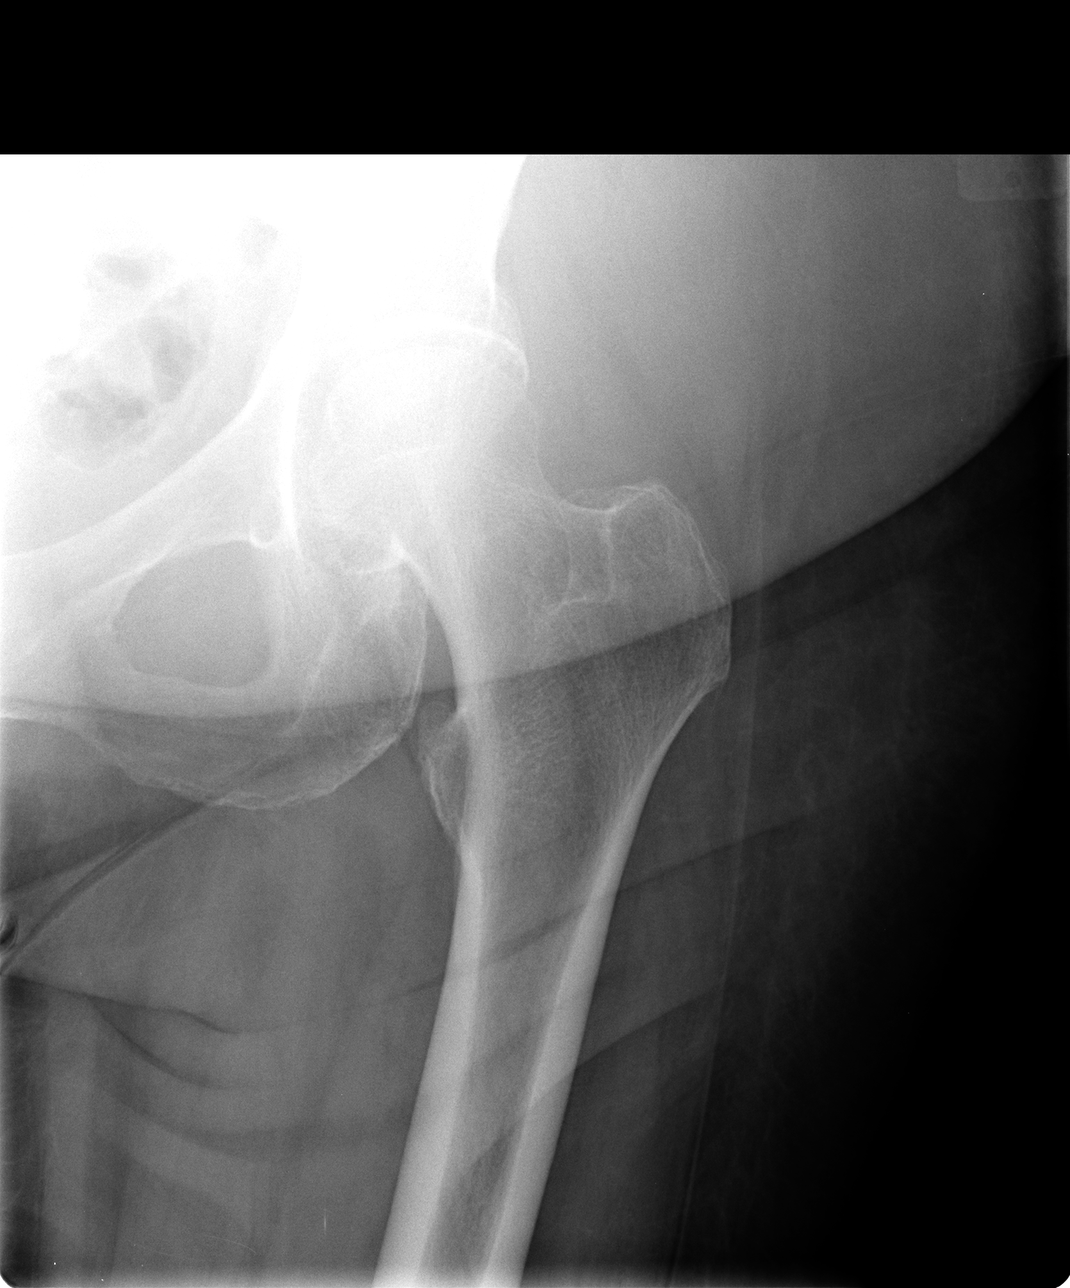

[view not recorded (3 of 3)]
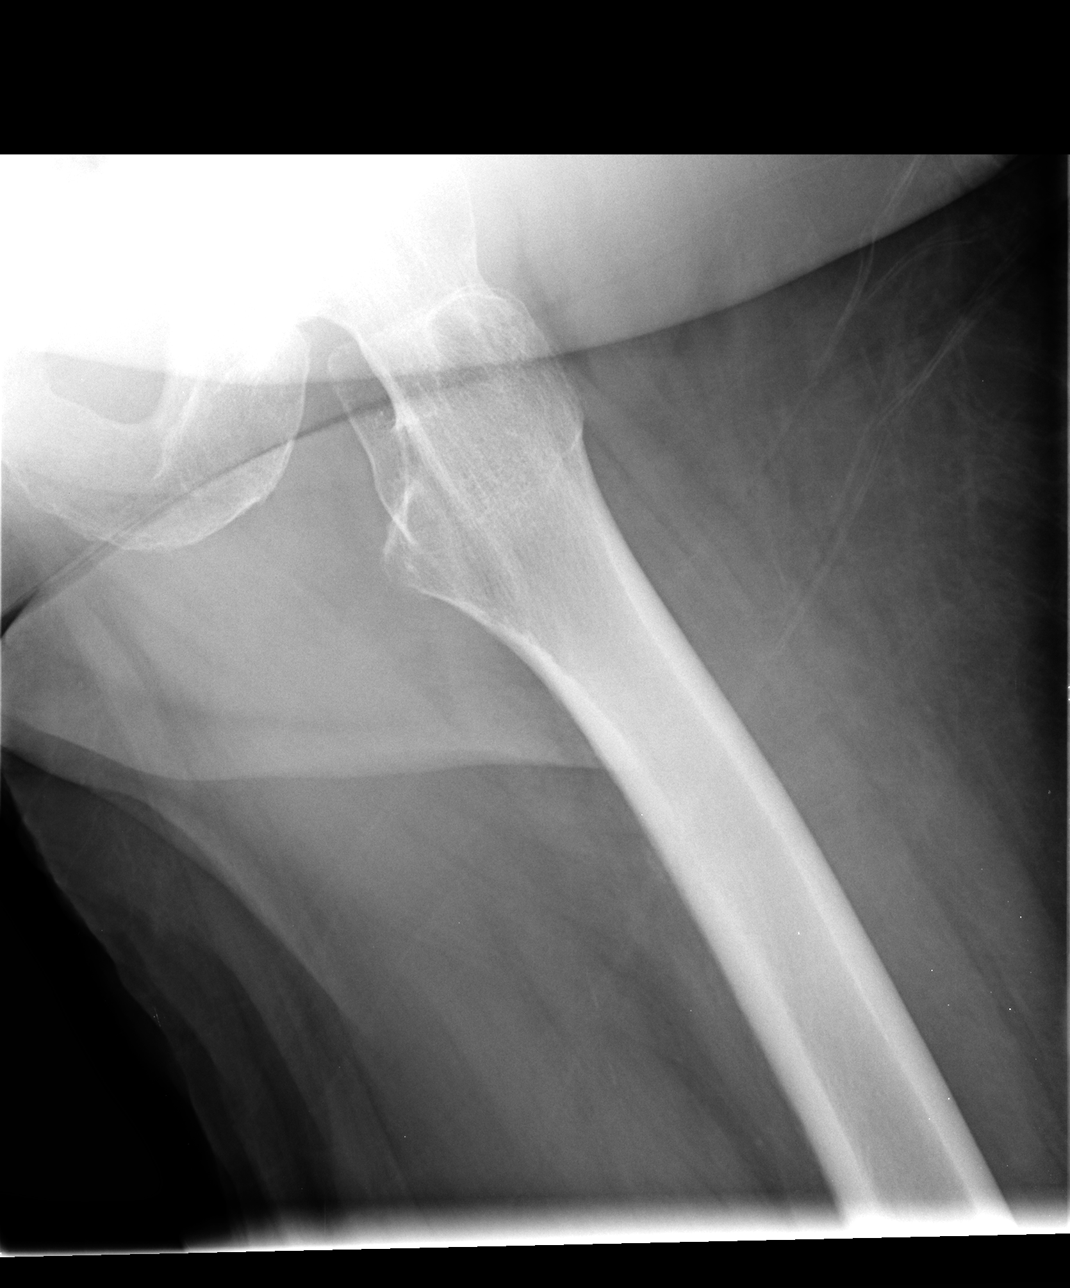

[3 of 3 positions shown; findings below may reference images not displayed]

FINDINGS: There is no evidence of hip fracture or dislocation. There is no
evidence of arthropathy or other focal bone abnormality.
Calcification over the pelvis is reidentified, most compatible with
a calcified fibroid. Degenerative change adjacent to the symphysis
pubis is reidentified. Clothing artifact creates artifactual linear
lucencies over the proximal femora bilaterally.
IMPRESSION: No left hip fracture or dislocation identified.

## 2015-02-12 ENCOUNTER — Encounter (HOSPITAL_COMMUNITY): Payer: Self-pay

## 2015-02-12 ENCOUNTER — Emergency Department (HOSPITAL_COMMUNITY)
Admission: EM | Admit: 2015-02-12 | Discharge: 2015-02-12 | Disposition: A | Discharge status: Home / Self Care | Payer: Medicare Other | Attending: Emergency Medicine | Admitting: Emergency Medicine

## 2015-02-12 ENCOUNTER — Emergency Department (HOSPITAL_COMMUNITY): Payer: Medicare Other

## 2015-02-12 DIAGNOSIS — R51 Headache: Secondary | ICD-10-CM | POA: Insufficient documentation

## 2015-02-12 DIAGNOSIS — Z7902 Long term (current) use of antithrombotics/antiplatelets: Secondary | ICD-10-CM | POA: Diagnosis not present

## 2015-02-12 DIAGNOSIS — Z880 Allergy status to penicillin: Secondary | ICD-10-CM | POA: Diagnosis not present

## 2015-02-12 DIAGNOSIS — E119 Type 2 diabetes mellitus without complications: Secondary | ICD-10-CM | POA: Diagnosis not present

## 2015-02-12 DIAGNOSIS — Z862 Personal history of diseases of the blood and blood-forming organs and certain disorders involving the immune mechanism: Secondary | ICD-10-CM | POA: Insufficient documentation

## 2015-02-12 DIAGNOSIS — M1990 Unspecified osteoarthritis, unspecified site: Secondary | ICD-10-CM | POA: Insufficient documentation

## 2015-02-12 DIAGNOSIS — Z7982 Long term (current) use of aspirin: Secondary | ICD-10-CM | POA: Insufficient documentation

## 2015-02-12 DIAGNOSIS — Z79899 Other long term (current) drug therapy: Secondary | ICD-10-CM | POA: Insufficient documentation

## 2015-02-12 DIAGNOSIS — R030 Elevated blood-pressure reading, without diagnosis of hypertension: Secondary | ICD-10-CM | POA: Diagnosis not present

## 2015-02-12 DIAGNOSIS — R110 Nausea: Secondary | ICD-10-CM | POA: Diagnosis not present

## 2015-02-12 DIAGNOSIS — Z8673 Personal history of transient ischemic attack (TIA), and cerebral infarction without residual deficits: Secondary | ICD-10-CM | POA: Insufficient documentation

## 2015-02-12 DIAGNOSIS — Z794 Long term (current) use of insulin: Secondary | ICD-10-CM | POA: Insufficient documentation

## 2015-02-12 DIAGNOSIS — R42 Dizziness and giddiness: Secondary | ICD-10-CM | POA: Diagnosis not present

## 2015-02-12 DIAGNOSIS — I129 Hypertensive chronic kidney disease with stage 1 through stage 4 chronic kidney disease, or unspecified chronic kidney disease: Secondary | ICD-10-CM | POA: Insufficient documentation

## 2015-02-12 DIAGNOSIS — N183 Chronic kidney disease, stage 3 (moderate): Secondary | ICD-10-CM | POA: Insufficient documentation

## 2015-02-12 DIAGNOSIS — I2510 Atherosclerotic heart disease of native coronary artery without angina pectoris: Secondary | ICD-10-CM | POA: Insufficient documentation

## 2015-02-12 DIAGNOSIS — H53149 Visual discomfort, unspecified: Secondary | ICD-10-CM | POA: Insufficient documentation

## 2015-02-12 DIAGNOSIS — R112 Nausea with vomiting, unspecified: Secondary | ICD-10-CM | POA: Insufficient documentation

## 2015-02-12 LAB — CBC WITH DIFFERENTIAL/PLATELET
BASOS ABS: 0 10*3/uL (ref 0.0–0.1)
Basophils Relative: 1 % (ref 0–1)
Eosinophils Absolute: 0.2 10*3/uL (ref 0.0–0.7)
Eosinophils Relative: 4 % (ref 0–5)
HEMATOCRIT: 35.5 % — AB (ref 36.0–46.0)
HEMOGLOBIN: 12.4 g/dL (ref 12.0–15.0)
LYMPHS PCT: 28 % (ref 12–46)
Lymphs Abs: 1.3 10*3/uL (ref 0.7–4.0)
MCH: 27.6 pg (ref 26.0–34.0)
MCHC: 34.9 g/dL (ref 30.0–36.0)
MCV: 79.1 fL (ref 78.0–100.0)
Monocytes Absolute: 0.4 10*3/uL (ref 0.1–1.0)
Monocytes Relative: 8 % (ref 3–12)
NEUTROS PCT: 59 % (ref 43–77)
Neutro Abs: 2.8 10*3/uL (ref 1.7–7.7)
Platelets: 167 10*3/uL (ref 150–400)
RBC: 4.49 MIL/uL (ref 3.87–5.11)
RDW: 14 % (ref 11.5–15.5)
WBC: 4.7 10*3/uL (ref 4.0–10.5)

## 2015-02-12 LAB — BASIC METABOLIC PANEL
ANION GAP: 4 — AB (ref 5–15)
BUN: 16 mg/dL (ref 6–20)
CO2: 27 mmol/L (ref 22–32)
Calcium: 9.3 mg/dL (ref 8.9–10.3)
Chloride: 108 mmol/L (ref 101–111)
Creatinine, Ser: 1.09 mg/dL — ABNORMAL HIGH (ref 0.44–1.00)
GFR calc Af Amer: 58 mL/min — ABNORMAL LOW (ref >60)
GFR calc non Af Amer: 50 mL/min — ABNORMAL LOW (ref >60)
GLUCOSE: 229 mg/dL — AB (ref 65–99)
POTASSIUM: 3.8 mmol/L (ref 3.5–5.1)
Sodium: 139 mmol/L (ref 135–145)

## 2015-02-12 LAB — TROPONIN I: Troponin I: <0.03 ng/mL (ref <0.031)

## 2015-02-12 MED ORDER — MECLIZINE HCL 12.5 MG PO TABS: 12.5000 mg | ORAL_TABLET | Freq: Three times a day (TID) | ORAL | Status: DC | PRN

## 2015-02-12 MED ORDER — NAPROXEN 250 MG PO TABS
500.0000 mg | ORAL_TABLET | Freq: Once | ORAL | Status: AC
  Administered 2015-02-12: 500 mg via ORAL
  Filled 2015-02-12: qty 2

## 2015-02-12 MED ORDER — MECLIZINE HCL 12.5 MG PO TABS
25.0000 mg | ORAL_TABLET | Freq: Once | ORAL | Status: AC
  Administered 2015-02-12: 25 mg via ORAL
  Filled 2015-02-12: qty 2

## 2015-02-12 MED ORDER — SODIUM CHLORIDE 0.9 % IV BOLUS (SEPSIS)
1000.0000 mL | Freq: Once | INTRAVENOUS | Status: AC
  Administered 2015-02-12: 1000 mL via INTRAVENOUS

## 2015-02-12 MED ORDER — ONDANSETRON HCL 4 MG/2ML IJ SOLN
4.0000 mg | Freq: Once | INTRAMUSCULAR | Status: DC
  Filled 2015-02-12: qty 2

## 2015-02-12 MED ORDER — DIAZEPAM 2 MG PO TABS
2.0000 mg | ORAL_TABLET | Freq: Once | ORAL | Status: AC
  Administered 2015-02-12: 2 mg via ORAL
  Filled 2015-02-12: qty 1

## 2015-02-12 MED ORDER — ONDANSETRON HCL 4 MG/2ML IJ SOLN
4.0000 mg | Freq: Once | INTRAMUSCULAR | Status: AC
  Administered 2015-02-12: 4 mg via INTRAVENOUS

## 2015-02-12 NOTE — ED Notes (Signed)
Patient ambulatory to restroom. Reports HA and dizziness is better.

## 2015-02-12 NOTE — ED Notes (Signed)
Patient states she woke up and was dizzy patient states "the room was spinning" patient states she has a history of vertigo, and this feels the same. Patient also complaining of nausea and vomiting X3

## 2015-02-12 NOTE — ED Notes (Signed)
Patient has ambulated twice to restroom without difficulty.

## 2015-02-12 NOTE — Discharge Instructions (Signed)
Dizziness There is no evidence of stroke. Take the dizziness medication as prescribed. Followup with your doctor and the neurologist. Return to the ED if you develop new or worsening symptoms. Dizziness is a common problem. It is a feeling of unsteadiness or light-headedness. You may feel like you are about to faint. Dizziness can lead to injury if you stumble or fall. A person of any age group can suffer from dizziness, but dizziness is more common in older adults. CAUSES  Dizziness can be caused by many different things, including:  Middle ear problems.  Standing for too long.  Infections.  An allergic reaction.  Aging.  An emotional response to something, such as the sight of blood.  Side effects of medicines.  Tiredness.  Problems with circulation or blood pressure.  Excessive use of alcohol or medicines, or illegal drug use.  Breathing too fast (hyperventilation).  An irregular heart rhythm (arrhythmia).  A low red blood cell count (anemia).  Pregnancy.  Vomiting, diarrhea, fever, or other illnesses that cause body fluid loss (dehydration).  Diseases or conditions such as Parkinson's disease, high blood pressure (hypertension), diabetes, and thyroid problems.  Exposure to extreme heat. DIAGNOSIS  Your health care provider will ask about your symptoms, perform a physical exam, and perform an electrocardiogram (ECG) to record the electrical activity of your heart. Your health care provider may also perform other heart or blood tests to determine the cause of your dizziness. These may include:  Transthoracic echocardiogram (TTE). During echocardiography, sound waves are used to evaluate how blood flows through your heart.  Transesophageal echocardiogram (TEE).  Cardiac monitoring. This allows your health care provider to monitor your heart rate and rhythm in real time.  Holter monitor. This is a portable device that records your heartbeat and can help diagnose heart  arrhythmias. It allows your health care provider to track your heart activity for several days if needed.  Stress tests by exercise or by giving medicine that makes the heart beat faster. TREATMENT  Treatment of dizziness depends on the cause of your symptoms and can vary greatly. HOME CARE INSTRUCTIONS   Drink enough fluids to keep your urine clear or pale yellow. This is especially important in very hot weather. In older adults, it is also important in cold weather.  Take your medicine exactly as directed if your dizziness is caused by medicines. When taking blood pressure medicines, it is especially important to get up slowly.  Rise slowly from chairs and steady yourself until you feel okay.  In the morning, first sit up on the side of the bed. When you feel okay, stand slowly while holding onto something until you know your balance is fine.  Move your legs often if you need to stand in one place for a long time. Tighten and relax your muscles in your legs while standing.  Have someone stay with you for 1-2 days if dizziness continues to be a problem. Do this until you feel you are well enough to stay alone. Have the person call your health care provider if he or she notices changes in you that are concerning.  Do not drive or use heavy machinery if you feel dizzy.  Do not drink alcohol. SEEK IMMEDIATE MEDICAL CARE IF:   Your dizziness or light-headedness gets worse.  You feel nauseous or vomit.  You have problems talking, walking, or using your arms, hands, or legs.  You feel weak.  You are not thinking clearly or you have trouble forming sentences.  It may take a friend or family member to notice this.  You have chest pain, abdominal pain, shortness of breath, or sweating.  Your vision changes.  You notice any bleeding.  You have side effects from medicine that seems to be getting worse rather than better. MAKE SURE YOU:   Understand these instructions.  Will watch  your condition.  Will get help right away if you are not doing well or get worse. Document Released: 12/06/2000 Document Revised: 06/17/2013 Document Reviewed: 12/30/2010 [facility] Patient Information 2015 [place], [place]. This information is not intended to replace advice given to you by your health care provider. Make sure you discuss any questions you have with your health care provider.

## 2015-02-12 NOTE — ED Provider Notes (Signed)
CSN: [id number]     Arrival date & time 02/12/15  0604 History   First MD Initiated Contact with Patient 02/12/15 [phone]     Chief Complaint  Patient presents with  . Dizziness     (Consider location/radiation/quality/duration/timing/severity/associated sxs/prior Treatment) HPI Comments: Patient awoke at 5 AM with dizziness, headache, nausea and vomiting. She reports "spinning". History of vertigo in the past. Vomiting 3 this morning. She also has a history of recurrent headaches and has a similar headache to her previous that is gradual in onset. She reports a history of migraines but normally does not have the dizziness and vomiting. She denies any chest pain, abdominal pain, shortness of breath, fever. No vision change. No focal weakness, numbness or tingling. No bowel or bladder incontinence.  The history is provided by the patient and a relative.    Past Medical History  Diagnosis Date  . Hypertension   . Type II diabetes mellitus   . TIA (transient ischemic attack) 1978  . Sickle cell trait   . Migraines     "weekly" (01/06/2014)  . Arthritis     "all over"  . CKD (chronic kidney disease), stage III   . CAD (coronary artery disease)     a. NSTEMI 12/2013 - occluded dominant RCA with L-R collaterals, moderate prox segmental LAD disease, for medical therapy initially, EF 60% with subtle inferobasal hypokinesia. Consider PCI for refractory CP.   Past Surgical History  Procedure Laterality Date  . Replacement total knee Right 05-03-06  . Tonsillectomy  1972  . Esophagogastroduodenoscopy  11/08/2007     Normal esophagus without evidence of Barrett, mass, erosion/ Normal stomach, duodenal bulb  . Gastric motility study  11/13/2007    mildly delayed emptying subjectively, but normal  analysis-77% of tracer emptied at 2 hours  . Laparoscopic appendectomy N/A 09/15/2012    Procedure: APPENDECTOMY LAPAROSCOPIC;  Surgeon: [name], MD;  Location: [facility];  Service: General;   Laterality: N/A;  . Joint replacement    . Appendectomy  March 2014    had appendix frozen  . Cataract extraction w/ intraocular lens  implant, bilateral Bilateral 04/2013-05/2013  . Cardiac catheterization  "years ago" & 01/05/2014  . Tee without cardioversion N/A 03/17/2014    Procedure: TRANSESOPHAGEAL ECHOCARDIOGRAM (TEE);  Surgeon: [name], MD;  Location: [facility] ENDOSCOPY;  Service: Cardiovascular;  Laterality: N/A;  . Cardioversion N/A 03/17/2014    Procedure: CARDIOVERSION;  Surgeon: [name], MD;  Location: [facility] ENDOSCOPY;  Service: Cardiovascular;  Laterality: N/A;  . Left heart catheterization with coronary angiogram N/A 01/05/2014    Procedure: LEFT HEART CATHETERIZATION WITH CORONARY ANGIOGRAM;  Surgeon: [name], MD;  Location: [facility] CATH LAB;  Service: Cardiovascular;  Laterality: N/A;   Family History  Problem Relation Age of Onset  . Hyperlipidemia Mother   . Hypertension Mother   . Heart attack Mother   . Cancer Father     lung  . Hypertension Sister   . Diabetes Brother   . Heart disease Brother   . Hyperlipidemia Brother   . Hypertension Brother   . Heart attack Brother   . Other Brother     DVT   Social History  Substance Use Topics  . Smoking status: Never Smoker   . Smokeless tobacco: Never Used  . Alcohol Use: No   OB History    Gravida Para Term Preterm AB TAB SAB Ectopic Multiple Living   2 2 2  2     Review of Systems  Constitutional: Negative for fever and activity change.  HENT: Negative for congestion and rhinorrhea.   Respiratory: Negative for cough, chest tightness and shortness of breath.   Cardiovascular: Negative for chest pain.  Gastrointestinal: Positive for nausea and vomiting. Negative for abdominal pain.  Genitourinary: Negative for dysuria, hematuria, vaginal bleeding and vaginal discharge.  Musculoskeletal: Negative for myalgias and arthralgias.  Skin: Negative for rash.  Neurological: Positive for dizziness  and headaches. Negative for speech difficulty, weakness and light-headedness.   A complete 10 system review of systems was obtained and all systems are negative except as noted in the HPI and PMH.     Allergies  Penicillins  Home Medications   Prior to Admission medications   Medication Sig Start Date End Date Taking? Authorizing Provider  apixaban (ELIQUIS) 5 MG TABS tablet Take 1 tablet (5 mg total) by mouth 2 (two) times daily. 10/20/14  Yes [name], MD  aspirin 81 MG chewable tablet Chew 81 mg by mouth daily.   Yes Historical Provider, MD  atenolol (TENORMIN) 25 MG tablet Take 25 mg by mouth daily.   Yes Historical Provider, MD  atorvastatin (LIPITOR) 80 MG tablet Take 1 tablet (80 mg total) by mouth every evening. 08/19/14  Yes [name], MD  Cholecalciferol (VITAMIN D-3) 1000 UNITS CAPS Take 1,000 Units by mouth every morning.    Yes Historical Provider, MD  clopidogrel (PLAVIX) 75 MG tablet Take 1 tablet (75 mg total) by mouth daily. 01/06/14  Yes Dayna N Dunn, PA-C  clotrimazole-betamethasone (LOTRISONE) cream Apply 1 application topically 3 (three) times daily. 01/13/14  Yes Historical Provider, MD  colesevelam (WELCHOL) 625 MG tablet Take 1,875 mg by mouth 2 (two) times daily with a meal. *May take 6 tablets once a day with meals*   Yes Historical Provider, MD  furosemide (LASIX) 40 MG tablet Take 1 tablet (40 mg total) by mouth every morning. 01/07/14  Yes Dayna N Dunn, PA-C  glimepiride (AMARYL) 2 MG tablet Take 2 mg by mouth daily. 08/15/14  Yes Historical Provider, MD  insulin aspart (NOVOLOG) 100 UNIT/ML injection Inject 2-5 Units into the skin 3 (three) times daily with meals. SSI   Yes Historical Provider, MD  insulin glargine (LANTUS) 100 UNIT/ML injection Inject 27 Units into the skin at bedtime.   Yes Historical Provider, MD  lisinopril (PRINIVIL,ZESTRIL) 20 MG tablet Take 10 mg by mouth daily.  09/08/13  Yes Historical Provider, MD  nitroGLYCERIN (NITROSTAT) 0.4  MG SL tablet Place 1 tablet (0.4 mg total) under the tongue every 5 (five) minutes as needed for chest pain (up to 3 doses). 01/06/14  Yes Dayna N Dunn, PA-C  amitriptyline (ELAVIL) 25 MG tablet Take 25 mg by mouth at bedtime.  10/17/12   Rolland Porter, MD  amLODipine (NORVASC) 10 MG tablet Take 5 mg by mouth daily.    Historical Provider, MD  meclizine (ANTIVERT) 12.5 MG tablet Take 1 tablet (12.5 mg total) by mouth 3 (three) times daily as needed for dizziness. 02/12/15   Ezequiel Essex, MD   BP 135/55 mmHg  Pulse 56  Temp(Src) 98 F (36.7 C) (Oral)  Resp 16  Ht 5\' 6"  (1.676 m)  Wt 200 lb (90.719 kg)  BMI 32.30 kg/m2  SpO2 98% Physical Exam  Constitutional: She is oriented to person, place, and time. She appears well-developed and well-nourished. No distress.  HENT:  Head: Normocephalic and atraumatic.  Mouth/Throat: Oropharynx is clear and  moist. No oropharyngeal exudate.  Eyes: Conjunctivae and EOM are normal. Pupils are equal, round, and reactive to light.  photophobic  Neck: Normal range of motion. Neck supple.  No meningismus.  Cardiovascular: Normal rate, regular rhythm, normal heart sounds and intact distal pulses.   No murmur heard. Pulmonary/Chest: Effort normal and breath sounds normal. No respiratory distress.  Abdominal: Soft. There is no tenderness. There is no rebound and no guarding.  Musculoskeletal: Normal range of motion. She exhibits no edema or tenderness.  Neurological: She is alert and oriented to person, place, and time. No cranial nerve deficit. She exhibits normal muscle tone. Coordination normal.  No ataxia on finger to nose bilaterally. No pronator drift. 5/5 strength throughout. CN 2-12 intact. Negative Romberg. Equal grip strength. Sensation intact. No ataxia no Nystagmus.  Skin: Skin is warm.  Psychiatric: She has a normal mood and affect. Her behavior is normal.  Nursing note and vitals reviewed.   ED Course  Procedures (including critical care  time) Labs Review Labs Reviewed  CBC WITH DIFFERENTIAL/PLATELET - Abnormal; Notable for the following:    HCT 35.5 (*)    All other components within normal limits  BASIC METABOLIC PANEL - Abnormal; Notable for the following:    Glucose, Bld 229 (*)    Creatinine, Ser 1.09 (*)    GFR calc non Af Amer 50 (*)    GFR calc Af Amer 58 (*)    Anion gap 4 (*)    All other components within normal limits  TROPONIN I    Imaging Review Mr Herby Abraham Contrast  02/12/2015   ADDENDUM REPORT: 02/12/2015 10:48  ADDENDUM: Voice recognition error: The second sentence of the Findings section should read "No acute hemorrhage or mass lesion is present."   Electronically Signed   By: San Morelle M.D.   On: 02/12/2015 10:48   02/12/2015   CLINICAL DATA:  The patient awoke with dizziness, nausea, and vomiting.  EXAM: MRI HEAD WITHOUT CONTRAST  TECHNIQUE: Multiplanar, multiecho pulse sequences of the brain and surrounding structures were obtained without intravenous contrast.  COMPARISON:  CT head without contrast 08/25/2010.  FINDINGS: The diffusion-weighted images demonstrate no evidence for acute or subacute infarction. Acute hemorrhage or mass lesion is present. The ventricles are of normal size. Mild periventricular T2 changes are evident bilaterally.  Flow is present in the major intracranial arteries. Bilateral lens replacements are present. Circumferential mucosal thickening is present in the right maxillary sinus. More focal mucosal thickening is noted along the floor of the left maxillary sinus. The remaining paranasal sinuses are clear. The mastoid air cells are clear.  Skullbase is within normal limits. Midline structures are unremarkable.  IMPRESSION: 1. No acute intracranial abnormality. 2. Mild age advanced white matter changes. These are nonspecific but likely reflect the sequela of chronic microvascular ischemia.  Electronically Signed: By: San Morelle M.D. On: 02/12/2015 08:35   I have  personally reviewed and evaluated these images and lab results as part of my medical decision-making.   EKG Interpretation   Date/Time:  Friday February 12 2015 06:24:44 EDT Ventricular Rate:  60 PR Interval:  158 QRS Duration: 101 QT Interval:  404 QTC Calculation: 404 R Axis:   48 Text Interpretation:  Sinus rhythm Borderline low voltage, extremity leads  Nonspecific ST abnormality Confirmed by Wyvonnia Dusky  MD, Francys Bolin ([id number]) on  02/12/2015 6:55:39 AM      MDM   Final diagnoses:  Vertigo   Vertigo and dizziness with nausea and vomiting.  No focal deficits.  Orthostatics negative.  Likely peripheral vertigo, but patient with many stroke risk factors.  On eliquis.   MRI shows no evidence of infarct. Dizziness is controlled with meclizine in the ED. Patient is able to ambulate and tolerate by mouth.  Suspect peripheral vertigo. Patient feels improved after meclizine and Valium. Headache is similar to what she has had previously and improved with treatment in the ED. No thunderclap onset. Doubt subretinal hemorrhage, doubt meningitis.  Treat peripheral vertigo with meclizine. Follow up with neurology. Return precautions discussed.    Ezequiel Essex, MD 02/12/15 1415

## 2015-02-12 NOTE — ED Notes (Signed)
Patient given diet coke to sip on for fluid challenge.

## 2015-02-16 DIAGNOSIS — Z1389 Encounter for screening for other disorder: Secondary | ICD-10-CM | POA: Diagnosis not present

## 2015-02-16 DIAGNOSIS — Z6831 Body mass index (BMI) 31.0-31.9, adult: Secondary | ICD-10-CM | POA: Diagnosis not present

## 2015-02-16 DIAGNOSIS — E1129 Type 2 diabetes mellitus with other diabetic kidney complication: Secondary | ICD-10-CM | POA: Diagnosis not present

## 2015-02-16 DIAGNOSIS — I10 Essential (primary) hypertension: Secondary | ICD-10-CM | POA: Diagnosis not present

## 2015-02-16 DIAGNOSIS — E6609 Other obesity due to excess calories: Secondary | ICD-10-CM | POA: Diagnosis not present

## 2015-02-16 DIAGNOSIS — E1140 Type 2 diabetes mellitus with diabetic neuropathy, unspecified: Secondary | ICD-10-CM | POA: Diagnosis not present

## 2015-02-26 ENCOUNTER — Other Ambulatory Visit: Payer: Self-pay | Admitting: Internal Medicine

## 2015-02-26 NOTE — Telephone Encounter (Signed)
Rx has been sent to the pharmacy electronically. ° °

## 2015-03-22 DIAGNOSIS — N183 Chronic kidney disease, stage 3 (moderate): Secondary | ICD-10-CM | POA: Diagnosis not present

## 2015-03-22 DIAGNOSIS — D649 Anemia, unspecified: Secondary | ICD-10-CM | POA: Diagnosis not present

## 2015-03-22 DIAGNOSIS — I10 Essential (primary) hypertension: Secondary | ICD-10-CM | POA: Diagnosis not present

## 2015-03-22 DIAGNOSIS — R809 Proteinuria, unspecified: Secondary | ICD-10-CM | POA: Diagnosis not present

## 2015-03-22 DIAGNOSIS — E559 Vitamin D deficiency, unspecified: Secondary | ICD-10-CM | POA: Diagnosis not present

## 2015-03-24 DIAGNOSIS — N2581 Secondary hyperparathyroidism of renal origin: Secondary | ICD-10-CM | POA: Diagnosis not present

## 2015-03-24 DIAGNOSIS — R809 Proteinuria, unspecified: Secondary | ICD-10-CM | POA: Diagnosis not present

## 2015-03-24 DIAGNOSIS — E559 Vitamin D deficiency, unspecified: Secondary | ICD-10-CM | POA: Diagnosis not present

## 2015-03-24 DIAGNOSIS — N183 Chronic kidney disease, stage 3 (moderate): Secondary | ICD-10-CM | POA: Diagnosis not present

## 2015-04-09 ENCOUNTER — Encounter (HOSPITAL_COMMUNITY): Payer: Self-pay | Admitting: Emergency Medicine

## 2015-04-09 ENCOUNTER — Emergency Department (HOSPITAL_COMMUNITY)
Admission: EM | Admit: 2015-04-09 | Discharge: 2015-04-09 | Disposition: A | Discharge status: Home / Self Care | Payer: Medicare Other | Attending: Emergency Medicine | Admitting: Emergency Medicine

## 2015-04-09 DIAGNOSIS — Z862 Personal history of diseases of the blood and blood-forming organs and certain disorders involving the immune mechanism: Secondary | ICD-10-CM | POA: Insufficient documentation

## 2015-04-09 DIAGNOSIS — Z7952 Long term (current) use of systemic steroids: Secondary | ICD-10-CM | POA: Insufficient documentation

## 2015-04-09 DIAGNOSIS — R42 Dizziness and giddiness: Secondary | ICD-10-CM | POA: Insufficient documentation

## 2015-04-09 DIAGNOSIS — Z794 Long term (current) use of insulin: Secondary | ICD-10-CM | POA: Insufficient documentation

## 2015-04-09 DIAGNOSIS — R51 Headache: Secondary | ICD-10-CM | POA: Diagnosis not present

## 2015-04-09 DIAGNOSIS — Z880 Allergy status to penicillin: Secondary | ICD-10-CM | POA: Diagnosis not present

## 2015-04-09 DIAGNOSIS — N183 Chronic kidney disease, stage 3 (moderate): Secondary | ICD-10-CM | POA: Insufficient documentation

## 2015-04-09 DIAGNOSIS — M542 Cervicalgia: Secondary | ICD-10-CM | POA: Insufficient documentation

## 2015-04-09 DIAGNOSIS — Z8673 Personal history of transient ischemic attack (TIA), and cerebral infarction without residual deficits: Secondary | ICD-10-CM | POA: Insufficient documentation

## 2015-04-09 DIAGNOSIS — Z7901 Long term (current) use of anticoagulants: Secondary | ICD-10-CM | POA: Insufficient documentation

## 2015-04-09 DIAGNOSIS — Z7982 Long term (current) use of aspirin: Secondary | ICD-10-CM | POA: Diagnosis not present

## 2015-04-09 DIAGNOSIS — I2510 Atherosclerotic heart disease of native coronary artery without angina pectoris: Secondary | ICD-10-CM | POA: Insufficient documentation

## 2015-04-09 DIAGNOSIS — Z9889 Other specified postprocedural states: Secondary | ICD-10-CM | POA: Diagnosis not present

## 2015-04-09 DIAGNOSIS — M1990 Unspecified osteoarthritis, unspecified site: Secondary | ICD-10-CM | POA: Insufficient documentation

## 2015-04-09 DIAGNOSIS — R1110 Vomiting, unspecified: Secondary | ICD-10-CM | POA: Diagnosis not present

## 2015-04-09 DIAGNOSIS — Z79899 Other long term (current) drug therapy: Secondary | ICD-10-CM | POA: Insufficient documentation

## 2015-04-09 DIAGNOSIS — I129 Hypertensive chronic kidney disease with stage 1 through stage 4 chronic kidney disease, or unspecified chronic kidney disease: Secondary | ICD-10-CM | POA: Insufficient documentation

## 2015-04-09 DIAGNOSIS — R519 Headache, unspecified: Secondary | ICD-10-CM

## 2015-04-09 DIAGNOSIS — E119 Type 2 diabetes mellitus without complications: Secondary | ICD-10-CM | POA: Diagnosis not present

## 2015-04-09 LAB — BASIC METABOLIC PANEL
Anion gap: 2 — ABNORMAL LOW (ref 5–15)
BUN: 28 mg/dL — AB (ref 6–20)
CHLORIDE: 111 mmol/L (ref 101–111)
CO2: 26 mmol/L (ref 22–32)
CREATININE: 1.44 mg/dL — AB (ref 0.44–1.00)
Calcium: 10.5 mg/dL — ABNORMAL HIGH (ref 8.9–10.3)
GFR calc Af Amer: 42 mL/min — ABNORMAL LOW (ref >60)
GFR calc non Af Amer: 36 mL/min — ABNORMAL LOW (ref >60)
Glucose, Bld: 61 mg/dL — ABNORMAL LOW (ref 65–99)
Potassium: 4.5 mmol/L (ref 3.5–5.1)
SODIUM: 139 mmol/L (ref 135–145)

## 2015-04-09 LAB — CBC WITH DIFFERENTIAL/PLATELET
Basophils Absolute: 0 10*3/uL (ref 0.0–0.1)
Basophils Relative: 1 %
EOS ABS: 0.3 10*3/uL (ref 0.0–0.7)
Eosinophils Relative: 5 %
HEMATOCRIT: 36.1 % (ref 36.0–46.0)
HEMOGLOBIN: 12.7 g/dL (ref 12.0–15.0)
Lymphocytes Relative: 31 %
Lymphs Abs: 1.6 10*3/uL (ref 0.7–4.0)
MCH: 27.9 pg (ref 26.0–34.0)
MCHC: 35.2 g/dL (ref 30.0–36.0)
MCV: 79.2 fL (ref 78.0–100.0)
MONOS PCT: 8 %
Monocytes Absolute: 0.4 10*3/uL (ref 0.1–1.0)
Neutro Abs: 2.8 10*3/uL (ref 1.7–7.7)
Neutrophils Relative %: 55 %
Platelets: 199 10*3/uL (ref 150–400)
RBC: 4.56 MIL/uL (ref 3.87–5.11)
RDW: 14.3 % (ref 11.5–15.5)
WBC: 5.1 10*3/uL (ref 4.0–10.5)

## 2015-04-09 LAB — TROPONIN I

## 2015-04-09 NOTE — Discharge Instructions (Signed)
Tests showed no life-threatening condition. Follow-up your primary care doctor or return if worse. °

## 2015-04-09 NOTE — ED Notes (Signed)
Pt states that she was watching tv and had sudden right sided headache and neck pain with dizziness.  Denies blurred vision at this time.

## 2015-04-09 NOTE — ED Notes (Signed)
Meal tray given 

## 2015-04-09 NOTE — ED Provider Notes (Signed)
CSN: [id number]     Arrival date & time 04/09/15  1140 History  By signing my name below, I, Terressa Koyanagi, attest that this documentation has been prepared under the direction and in the presence of Nat Christen, MD. Electronically Signed: Terressa Koyanagi, ED Scribe. 04/09/2015. 12:15 PM. Chief Complaint  Patient presents with  . Dizziness   HPI PCP: Glo Herring., MD HPI Comments: Cassandra Adams is a 69 y.o. female, with PMHx noted below including HTN, DMTII, TIA, CAD, CKD stage III, stent placement, and vertigo, who presents to the Emergency Department complaining of right sided neck pain with associated dizziness, pain to anterior lateral right forehead and one episode of vomiting, onset this morning while pt was watching television. Pt denies weakness or numbness of BLE or BUE, trouble swallowing, confusion, speech difficulty.   Past Medical History  Diagnosis Date  . Hypertension   . Type II diabetes mellitus ([place])   . TIA (transient ischemic attack) 1978  . Sickle cell trait ([place])   . Migraines     "weekly" (01/06/2014)  . Arthritis     "all over"  . CKD (chronic kidney disease), stage III   . CAD (coronary artery disease)     a. NSTEMI 12/2013 - occluded dominant RCA with L-R collaterals, moderate prox segmental LAD disease, for medical therapy initially, EF 60% with subtle inferobasal hypokinesia. Consider PCI for refractory CP.   Past Surgical History  Procedure Laterality Date  . Replacement total knee Right 05-03-06  . Tonsillectomy  1972  . Esophagogastroduodenoscopy  11/08/2007     Normal esophagus without evidence of Barrett, mass, erosion/ Normal stomach, duodenal bulb  . Gastric motility study  11/13/2007    mildly delayed emptying subjectively, but normal  analysis-77% of tracer emptied at 2 hours  . Laparoscopic appendectomy N/A 09/15/2012    Procedure: APPENDECTOMY LAPAROSCOPIC;  Surgeon: [name], MD;  Location: [facility];  Service: General;  Laterality:  N/A;  . Joint replacement    . Appendectomy  March 2014    had appendix frozen  . Cataract extraction w/ intraocular lens  implant, bilateral Bilateral 04/2013-05/2013  . Cardiac catheterization  "years ago" & 01/05/2014  . Tee without cardioversion N/A 03/17/2014    Procedure: TRANSESOPHAGEAL ECHOCARDIOGRAM (TEE);  Surgeon: [name], MD;  Location: [facility] ENDOSCOPY;  Service: Cardiovascular;  Laterality: N/A;  . Cardioversion N/A 03/17/2014    Procedure: CARDIOVERSION;  Surgeon: [name], MD;  Location: [facility] ENDOSCOPY;  Service: Cardiovascular;  Laterality: N/A;  . Left heart catheterization with coronary angiogram N/A 01/05/2014    Procedure: LEFT HEART CATHETERIZATION WITH CORONARY ANGIOGRAM;  Surgeon: [name], MD;  Location: [facility] CATH LAB;  Service: Cardiovascular;  Laterality: N/A;   Family History  Problem Relation Age of Onset  . Hyperlipidemia Mother   . Hypertension Mother   . Heart attack Mother   . Cancer Father     lung  . Hypertension Sister   . Diabetes Brother   . Heart disease Brother   . Hyperlipidemia Brother   . Hypertension Brother   . Heart attack Brother   . Other Brother     DVT   Social History  Substance Use Topics  . Smoking status: Never Smoker   . Smokeless tobacco: Never Used  . Alcohol Use: No   OB History    Gravida Para Term Preterm AB TAB SAB Ectopic Multiple Living   2 2 2        2  Review of Systems  HENT: Negative for trouble swallowing.         pain to anterior lateral right forehead  Eyes: Negative for visual disturbance.  Gastrointestinal: Positive for vomiting.  Musculoskeletal: Positive for neck pain.  Neurological: Positive for dizziness. Negative for speech difficulty, weakness and numbness.  Psychiatric/Behavioral: Negative for confusion.  A complete 10 system review of systems was obtained and all systems are negative except as noted in the HPI and PMH.  Allergies  Penicillins  Home Medications   Prior  to Admission medications   Medication Sig Start Date End Date Taking? Authorizing Provider  amitriptyline (ELAVIL) 25 MG tablet Take 25 mg by mouth at bedtime.  10/17/12  Yes Rolland Porter, MD  amLODipine (NORVASC) 10 MG tablet Take 5 mg by mouth daily.   Yes Historical Provider, MD  apixaban (ELIQUIS) 5 MG TABS tablet Take 1 tablet (5 mg total) by mouth 2 (two) times daily. 10/20/14  Yes [name], MD  aspirin 81 MG chewable tablet Chew 81 mg by mouth daily.   Yes Historical Provider, MD  atenolol (TENORMIN) 25 MG tablet Take 25 mg by mouth daily.   Yes Historical Provider, MD  atorvastatin (LIPITOR) 80 MG tablet TAKE ONE TABLET BY MOUTH IN THE EVENING. 02/26/15  Yes [name], MD  Cholecalciferol (VITAMIN D-3) 1000 UNITS CAPS Take 1,000 Units by mouth every morning.    Yes Historical Provider, MD  clopidogrel (PLAVIX) 75 MG tablet Take 1 tablet (75 mg total) by mouth daily. 01/06/14  Yes Dayna N Dunn, PA-C  clotrimazole-betamethasone (LOTRISONE) cream Apply 1 application topically 3 (three) times daily. 01/13/14  Yes Historical Provider, MD  colesevelam (WELCHOL) 625 MG tablet Take 1,875 mg by mouth 2 (two) times daily with a meal. *May take 6 tablets once a day with meals*   Yes Historical Provider, MD  furosemide (LASIX) 40 MG tablet Take 1 tablet (40 mg total) by mouth every morning. 01/07/14  Yes Dayna N Dunn, PA-C  glimepiride (AMARYL) 2 MG tablet Take 6 mg by mouth daily with breakfast.  08/15/14  Yes Historical Provider, MD  insulin aspart (NOVOLOG) 100 UNIT/ML injection Inject 10 Units into the skin 3 (three) times daily with meals. SSI   Yes Historical Provider, MD  insulin glargine (LANTUS) 100 UNIT/ML injection Inject 27 Units into the skin at bedtime.   Yes Historical Provider, MD  lisinopril (PRINIVIL,ZESTRIL) 20 MG tablet Take 20 mg by mouth daily.  09/08/13  Yes Historical Provider, MD  meclizine (ANTIVERT) 12.5 MG tablet Take 1 tablet (12.5 mg total) by mouth 3 (three) times daily  as needed for dizziness. 02/12/15  Yes Ezequiel Essex, MD  nitroGLYCERIN (NITROSTAT) 0.4 MG SL tablet Place 1 tablet (0.4 mg total) under the tongue every 5 (five) minutes as needed for chest pain (up to 3 doses). 01/06/14  Yes Dayna N Dunn, PA-C   Triage Vitals: BP 162/64 mmHg  Pulse 55  Resp 16  Ht 5\' 6"  (1.676 m)  Wt 200 lb (90.719 kg)  BMI 32.30 kg/m2  SpO2 100% Physical Exam  Constitutional: She is oriented to person, place, and time. She appears well-developed and well-nourished.  HENT:  Head: Normocephalic and atraumatic.  Eyes: Conjunctivae and EOM are normal. Pupils are equal, round, and reactive to light.  Neck: Normal range of motion. Neck supple.  Cardiovascular: Normal rate and regular rhythm.   Pulmonary/Chest: Effort normal and breath sounds normal.  Abdominal: Soft. Bowel sounds are normal.  Musculoskeletal: Normal  range of motion.  Neurological: She is alert and oriented to person, place, and time.  Skin: Skin is warm and dry.  Psychiatric: She has a normal mood and affect. Her behavior is normal.  Nursing note and vitals reviewed.   ED Course  Procedures (including critical care time) DIAGNOSTIC STUDIES: Oxygen Saturation is 100% on RA, nl by my interpretation.    COORDINATION OF CARE: 11:58 AM: Discussed treatment plan which includes labs and EKG with pt at bedside; patient verbalizes understanding and agrees with treatment plan.  Labs Review Labs Reviewed  BASIC METABOLIC PANEL - Abnormal; Notable for the following:    Glucose, Bld 61 (*)    BUN 28 (*)    Creatinine, Ser 1.44 (*)    Calcium 10.5 (*)    GFR calc non Af Amer 36 (*)    GFR calc Af Amer 42 (*)    Anion gap 2 (*)    All other components within normal limits  CBC WITH DIFFERENTIAL/PLATELET  TROPONIN I   I have personally reviewed and evaluated these lab results as part of my medical decision-making.   EKG Interpretation   Date/Time:  Friday April 09 2015 12:20:44 EDT Ventricular  Rate:  49 PR Interval:  158 QRS Duration: 97 QT Interval:  421 QTC Calculation: 380 R Axis:   6 Text Interpretation:  Sinus bradycardia Atrial premature complex Probable  left ventricular hypertrophy Abnormal T, consider ischemia, diffuse leads  Confirmed by Dillin Lofgren  MD, Gasper Hopes ([id number]) on 04/09/2015 1:09:21 PM      MDM   Final diagnoses:  Headache, unspecified headache type  Neck pain    No obvious neurological deficits. Patient is alert and oriented 3. No meningeal signs. Glucose subnormal. Patient is eating calories.  Discussed test findings with the patient and her brother  I, Ngan Qualls, personally performed the services described in this documentation. All medical record entries made by the scribe were at my direction and in my presence.  I have reviewed the chart and discharge instructions and agree that the record reflects my personal performance and is accurate and complete. Cullin Dishman.  04/09/2015. 3:15 PM.     Nat Christen, MD 04/09/15 [phone]

## 2015-04-22 ENCOUNTER — Encounter: Payer: Self-pay | Admitting: Internal Medicine

## 2015-04-22 ENCOUNTER — Ambulatory Visit (INDEPENDENT_AMBULATORY_CARE_PROVIDER_SITE_OTHER): Payer: Medicare Other | Admitting: Internal Medicine

## 2015-04-22 VITALS — BP 152/68 | HR 49 | Ht 66.0 in | Wt 206.3 lb

## 2015-04-22 DIAGNOSIS — I214 Non-ST elevation (NSTEMI) myocardial infarction: Secondary | ICD-10-CM | POA: Diagnosis not present

## 2015-04-22 DIAGNOSIS — I2583 Coronary atherosclerosis due to lipid rich plaque: Secondary | ICD-10-CM

## 2015-04-22 DIAGNOSIS — R001 Bradycardia, unspecified: Secondary | ICD-10-CM | POA: Diagnosis not present

## 2015-04-22 DIAGNOSIS — I480 Paroxysmal atrial fibrillation: Secondary | ICD-10-CM

## 2015-04-22 DIAGNOSIS — I2510 Atherosclerotic heart disease of native coronary artery without angina pectoris: Secondary | ICD-10-CM | POA: Diagnosis not present

## 2015-04-22 MED ORDER — ATENOLOL 25 MG PO TABS: 12.5000 mg | ORAL_TABLET | Freq: Every day | ORAL | Status: DC

## 2015-04-22 MED ORDER — AMLODIPINE BESYLATE 10 MG PO TABS
10.0000 mg | ORAL_TABLET | Freq: Every day | ORAL | Status: DC
Start: 2015-04-22 — End: 2015-05-24

## 2015-04-22 NOTE — Progress Notes (Signed)
Patient ID: Cassandra Adams, female   DOB: 1945-08-06, 69 y.o.   MRN: [id number]    04/22/2015 Cassandra Adams   [birth year]  [id number]  Primary Physicia Glo Herring., MD Primary Cardiologist: Dr. Debara Pickett  [name] presents to clinic today for post hospital followup after recent admission for non-ST elevation myocardial infarction. Details regarding her history and recent hospital course are outlined in detail below.  HPI:  The patient is a 69 year old African American female with a history of diabetes, hypertension, prior stroke but no prior history of CAD, who initially presented to [facility] on 01/03/2014 with a complaint of chest pain. Initial troponin was elevated at 15.7 and EKG was with nonspecific ST-T changes. She was placed on IV heparin and IV nitroglycerin and was transferred to [facility] for further treatment. Her chest pain resolved. She underwent a diagnostic left heart catheterization, perform by Dr. Gwenlyn Found which demonstrated occlusion of the midportion of the RCA, which was felt to be the infarct-related artery. However, she had grade 3 left right collaterals. She also has moderate LAD artery disease with 50% segmental stenosis in the proximal portion with a focal area of 60-70% stenosis. The left main and left circumflex were both normal. Left ventricular systolic function was normal with an estimated ejection fraction of 60%. Wall motion abnormalities were notable for sublte inferior basal hypokinesia. Given her lack of ongoing symptoms and preserved LV function with excellent collaterals, it was recommended to treat with medical therapy. It was felt that if she were to develop recurrent chest pain then she would be a candidate for PCI and stenting of her RCA. She left the Cath Lab in stable condition and had no post-cath complications and no recurrent chest pain. She was placed on dual antiplatelet therapy with aspirin plus Plavix. She was also  placed on a beta blocker, an ACE inhibitor and statin. Also notable this admission, was a hemoglobin A1c level of 8.1. She was instructed to followup with her PCP for better management of her diabetes.  She was discharged home on 01/06/2014.  She presents to clinic today for post hospital followup. She is accompanied by her daughter. She denies any recurrent anginal symptoms. She also denies dyspnea, syncope/near-syncope. Yesterday however, she did note feeling a sensation of increased warmth and mild dizziness, that was self-limiting. This lasted only a minute or 2. Again, she had no syncope/near-syncope. She denies any further recurrence. She has been fully compliant with her medications.  Cassandra Adams is seen in followup today. Her EKG demonstrates a sinus rhythm today. However I suspect that she continues to have PAF. Unfortunate she took herself off of Xarelto after experiencing some oral bleeding. She did not notify the office and has not been on any anticoagulation for several months. We discussed her higher than normal risk of stroke, based on a CHADSVASC score of 5. She reports only one episode of angina in January, however it was very short-lived and sharp pain. She took a nitroglycerin and the symptoms went away after 10 or 15 minutes, suggesting this may not eventually been angina.  I saw Cassandra Adams today back in the office. Overall she is feeling well. She denies any chest pain or shortness of breath. She's not had any further episodes of A. fib. She is noted to be mildly bradycardic today. Blood pressure has run somewhat high for a while.  Current Outpatient Prescriptions  Medication Sig Dispense Refill  . amitriptyline (ELAVIL) 25 MG tablet Take 25  mg by mouth at bedtime.     [name] amLODipine (NORVASC) 10 MG tablet Take 1 tablet (10 mg total) by mouth daily. 30 tablet 6  . apixaban (ELIQUIS) 5 MG TABS tablet Take 1 tablet (5 mg total) by mouth 2 (two) times daily. 60 tablet 6  . aspirin  81 MG chewable tablet Chew 81 mg by mouth daily.    [name] atenolol (TENORMIN) 25 MG tablet Take 0.5 tablets (12.5 mg total) by mouth daily. 15 tablet 6  . atorvastatin (LIPITOR) 80 MG tablet TAKE ONE TABLET BY MOUTH IN THE EVENING. 30 tablet 6  . Cholecalciferol (VITAMIN D-3) 1000 UNITS CAPS Take 1,000 Units by mouth every morning.     . clopidogrel (PLAVIX) 75 MG tablet Take 1 tablet (75 mg total) by mouth daily. 30 tablet 11  . clotrimazole-betamethasone (LOTRISONE) cream Apply 1 application topically 3 (three) times daily.    . colesevelam (WELCHOL) 625 MG tablet Take 1,875 mg by mouth 2 (two) times daily with a meal. *May take 6 tablets once a day with meals*    . furosemide (LASIX) 40 MG tablet Take 1 tablet (40 mg total) by mouth every morning.    [name] glimepiride (AMARYL) 2 MG tablet Take 6 mg by mouth daily with breakfast.     . HUMALOG 100 UNIT/ML injection Inject 10 Units into the skin 3 (three) times daily with meals.     . insulin glargine (LANTUS) 100 UNIT/ML injection Inject 27 Units into the skin at bedtime.    [name] lisinopril (PRINIVIL,ZESTRIL) 20 MG tablet Take 20 mg by mouth daily.     . meclizine (ANTIVERT) 12.5 MG tablet Take 1 tablet (12.5 mg total) by mouth 3 (three) times daily as needed for dizziness. 30 tablet 0  . nitroGLYCERIN (NITROSTAT) 0.4 MG SL tablet Place 1 tablet (0.4 mg total) under the tongue every 5 (five) minutes as needed for chest pain (up to 3 doses). 25 tablet 3   No current facility-administered medications for this visit.    Allergies  Allergen Reactions  . Penicillins Hives    Has patient had a PCN reaction causing immediate rash, facial/tongue/throat swelling, SOB or lightheadedness with hypotension: no Has patient had a PCN reaction causing severe rash involving mucus membranes or skin necrosis: No no Has patient had a PCN reaction that required hospitalization: no Has patient had a PCN reaction occurring within the last 10 years: no If all of the above  answers are "NO", then may proceed with Cephalosporin use.     Social History   Social History  . Marital Status: Legally Separated    Spouse Name: N/A  . Number of Children: N/A  . Years of Education: N/A   Occupational History  . Not on file.   Social History Main Topics  . Smoking status: Never Smoker   . Smokeless tobacco: Never Used  . Alcohol Use: No  . Drug Use: No  . Sexual Activity: Not Currently    Birth Control/ Protection: Post-menopausal   Other Topics Concern  . Not on file   Social History Narrative     Review of Systems: General: negative for chills, fever, night sweats or weight changes.  Cardiovascular: negative for chest pain, dyspnea on exertion, edema, orthopnea, palpitations, paroxysmal nocturnal dyspnea or shortness of breath Dermatological: negative for rash Respiratory: negative for cough or wheezing Urologic: negative for hematuria Abdominal: negative for nausea, vomiting, diarrhea, bright red blood per rectum, melena, or hematemesis Neurologic: negative for visual changes,  syncope, or dizziness All other systems reviewed and are otherwise negative except as noted above.    Blood pressure 152/68, pulse 49, height 5\' 6"  (1.676 m), weight 206 lb 4.8 oz (93.577 kg).  General appearance: alert and no distress Neck: no carotid bruit, no JVD and thyroid not enlarged, symmetric, no tenderness/mass/nodules Lungs: clear to auscultation bilaterally Heart: regular rate and rhythm, S1, S2 normal, no murmur, click, rub or gallop Abdomen: soft, non-tender; bowel sounds normal; no masses,  no organomegaly Extremities: extremities normal, atraumatic, no cyanosis or edema Pulses: 2+ and symmetric Skin: Skin color, texture, turgor normal. No rashes or lesions Neurologic: Grossly normal Psych: Pleasant  EKG Marked sinus bradycardia, nonspecific T wave changes at 49  PROBLEM LIST: Patient Active Problem List   Diagnosis Date Noted  . Bradycardia  04/22/2015  . CAD (coronary artery disease) 02/26/2014  . Atrial fibrillation ([place]) 02/26/2014  . NSTEMI (non-ST elevated myocardial infarction) ([place]) 01/03/2014  . Occlusion and stenosis of carotid artery without mention of cerebral infarction 11/21/2013  . Pain in limb-Left neck 11/21/2013  . Carotid stenosis, bilateral 11/17/2011  . HYPERTHYROIDISM, SUBCLINICAL 02/15/2010  . GERD 02/15/2010  . DYSPHAGIA UNSPECIFIED 02/15/2010  . ABDOMINAL PAIN, UNSPECIFIED SITE 02/15/2010  . DIABETES MELLITUS, TYPE II 02/11/2010  . Essential hypertension 02/11/2010  . HEADACHE 02/11/2010    ASSESSMENT AND PLAN:  Mrs. Turrell is feeling well. She denies any significant fatigue however heart rate is low today. Blood pressure has been running in the 150s consistently. I like to decrease her atenolol to 12.5 mg daily and increase her Norvasc to 10 mg daily. She should have a follow-up blood pressure check with Erasmo Downer, our pharmacist in approximately 2 weeks. She also reported me she's been having some low morning blood sugars in the 30s and 40s. She should consider decreasing her Lantus insulin dose perhaps down to 20 units. I asked her to discuss this with her primary care provider in of her follow-up next month.  Plan to see her back in 6 months.  [name], MD, [facility] Attending Cardiologist [facility] 04/22/2015 9:58 AM

## 2015-04-22 NOTE — Patient Instructions (Signed)
Medication Instructions:   INCREASE amlodipine to 10mg  daily DECREASE atenolol to 12.5mg  daily  Follow-Up:  Blood Pressure Check with Erasmo Downer in 2 weeks  Your physician wants you to follow-up in: 6 months with Dr. Debara Pickett. You will receive a reminder letter in the mail two months in advance. If you don't receive a letter, please call our office to schedule the follow-up appointment.   Any Other Special Instructions Will Be Listed Below (If Applicable).

## 2015-05-06 ENCOUNTER — Ambulatory Visit (INDEPENDENT_AMBULATORY_CARE_PROVIDER_SITE_OTHER): Payer: Medicare Other | Admitting: Pharmacist Clinician (PhC)/ Clinical Pharmacy Specialist

## 2015-05-06 ENCOUNTER — Encounter: Payer: Self-pay | Admitting: Pharmacist Clinician (PhC)/ Clinical Pharmacy Specialist

## 2015-05-06 VITALS — BP 150/62 | HR 52 | Ht 66.0 in | Wt 206.2 lb

## 2015-05-06 DIAGNOSIS — I10 Essential (primary) hypertension: Secondary | ICD-10-CM | POA: Diagnosis not present

## 2015-05-06 NOTE — Assessment & Plan Note (Addendum)
Today her blood pressure remains slightly elevated at 150/62.  Her home cuff actually read about 12 points higher than the office reading.  Because of the lower extremity edema, I am going to cut her amlodipine back to 5 mg and increase the lisinopril to 40 mg.  I have asked her to continue with home BP monitoring and I will see her again in a month for follow up.  She will also go to the lab in a week for a repeat BMET

## 2015-05-06 NOTE — Patient Instructions (Signed)
Return for a a follow up appointment in 1 month  Your blood pressure today is 150/62  (goal is <140/90)  Check your blood pressure at home daily and keep record of the readings.  Take your BP meds as follows: increase lisinopril to 40 mg daily, decrease amlodipine to 5 mg daily; continue with atenolol  Bring your record of home blood pressures to your next appointment.  Exercise as you're able, try to walk approximately 30 minutes per day.  Keep salt intake to a minimum, especially watch canned and prepared boxed foods.  Eat more fresh fruits and vegetables and fewer canned items.  Avoid eating in fast food restaurants.    HOW TO TAKE YOUR BLOOD PRESSURE: . Rest 5 minutes before taking your blood pressure. .  Don't smoke or drink caffeinated beverages for at least 30 minutes before. . Take your blood pressure before (not after) you eat. . Sit comfortably with your back supported and both feet on the floor (don't cross your legs). . Elevate your arm to heart level on a table or a desk. . Use the proper sized cuff. It should fit smoothly and snugly around your bare upper arm. There should be enough room to slip a fingertip under the cuff. The bottom edge of the cuff should be 1 inch above the crease of the elbow. . Ideally, take 3 measurements at one sitting and record the average.

## 2015-05-06 NOTE — Progress Notes (Signed)
05/06/2015 Cassandra Adams [birth year] [id number]   HPI:  Cassandra Adams is a 69 y.o. female patient of Dr Debara Pickett, with a PMH below who presents today for hypertension clinic evaluation.  She is here today with her daughter.  When she saw Dr. Debara Pickett 2 weeks ago he increased her amlodipine to 10 mg and cut her atenolol to 12.5 mg.  Her heart rate that day was at 49.  She has noted some increased lower extremity swelling since increasing the amlodipine dose.     Cardiac Hx: DM, NSTEMI (12/2013), bradycardia, bilateral carotid stenosis  Family Hx: mother had first MI in her late 31's died from another at 79; brother with MI at 82  Social Hx: does not smoke or drink alcohol, drinks caffeine on occasion  Diet: does eat low sodium, no pork, avoids canned vegetables  Exercise: unable due to knee problems  Current antihypertensive medications: amlodipine 10 mg qd, atenolol 12.5 mg qd, lisinopril 20 mg qd  Home BP readings:  [id number] systolic, diastolic all WNL   Current Outpatient Prescriptions  Medication Sig Dispense Refill  . amitriptyline (ELAVIL) 25 MG tablet Take 25 mg by mouth at bedtime.     [name] amLODipine (NORVASC) 10 MG tablet Take 1 tablet (10 mg total) by mouth daily. 30 tablet 6  . apixaban (ELIQUIS) 5 MG TABS tablet Take 1 tablet (5 mg total) by mouth 2 (two) times daily. 60 tablet 6  . aspirin 81 MG chewable tablet Chew 81 mg by mouth daily.    [name] atenolol (TENORMIN) 25 MG tablet Take 0.5 tablets (12.5 mg total) by mouth daily. 15 tablet 6  . atorvastatin (LIPITOR) 80 MG tablet TAKE ONE TABLET BY MOUTH IN THE EVENING. 30 tablet 6  . Cholecalciferol (VITAMIN D-3) 1000 UNITS CAPS Take 1,000 Units by mouth every morning.     . clopidogrel (PLAVIX) 75 MG tablet Take 1 tablet (75 mg total) by mouth daily. 30 tablet 11  . clotrimazole-betamethasone (LOTRISONE) cream Apply 1 application topically 3 (three) times daily.    . colesevelam (WELCHOL) 625 MG tablet Take 1,875 mg  by mouth 2 (two) times daily with a meal. *May take 6 tablets once a day with meals*    . furosemide (LASIX) 40 MG tablet Take 1 tablet (40 mg total) by mouth every morning.    [name] glimepiride (AMARYL) 2 MG tablet Take 6 mg by mouth daily with breakfast.     . HUMALOG 100 UNIT/ML injection Inject 10 Units into the skin 3 (three) times daily with meals.     . insulin glargine (LANTUS) 100 UNIT/ML injection Inject 27 Units into the skin at bedtime.    [name] lisinopril (PRINIVIL,ZESTRIL) 20 MG tablet Take 20 mg by mouth daily.     . meclizine (ANTIVERT) 12.5 MG tablet Take 1 tablet (12.5 mg total) by mouth 3 (three) times daily as needed for dizziness. 30 tablet 0  . nitroGLYCERIN (NITROSTAT) 0.4 MG SL tablet Place 1 tablet (0.4 mg total) under the tongue every 5 (five) minutes as needed for chest pain (up to 3 doses). 25 tablet 3   No current facility-administered medications for this visit.    Allergies  Allergen Reactions  . Penicillins Hives    Has patient had a PCN reaction causing immediate rash, facial/tongue/throat swelling, SOB or lightheadedness with hypotension: no Has patient had a PCN reaction causing severe rash involving mucus membranes or skin necrosis: No no Has patient had a PCN reaction that  required hospitalization: no Has patient had a PCN reaction occurring within the last 10 years: no If all of the above answers are "NO", then may proceed with Cephalosporin use.     Past Medical History  Diagnosis Date  . Hypertension   . Type II diabetes mellitus ([place])   . TIA (transient ischemic attack) 1978  . Sickle cell trait ([place])   . Migraines     "weekly" (01/06/2014)  . Arthritis     "all over"  . CKD (chronic kidney disease), stage III   . CAD (coronary artery disease)     a. NSTEMI 12/2013 - occluded dominant RCA with L-R collaterals, moderate prox segmental LAD disease, for medical therapy initially, EF 60% with subtle inferobasal hypokinesia. Consider PCI for refractory CP.     Blood pressure 150/62, pulse 52, height 5\' 6"  (1.676 m), weight 206 lb 3.2 oz (93.532 kg).    Tommy Medal PharmD CPP [facility]

## 2015-05-17 ENCOUNTER — Telehealth: Payer: Self-pay | Admitting: Internal Medicine

## 2015-05-17 MED ORDER — ATENOLOL 25 MG PO TABS: 12.5000 mg | ORAL_TABLET | Freq: Every day | ORAL | Status: DC

## 2015-05-17 NOTE — Telephone Encounter (Signed)
Pt advised on recommendations. 90 day supply sent to her pharmacy at her preference.

## 2015-05-17 NOTE — Telephone Encounter (Signed)
Pt wants to know if Dr Debara Pickett wants her to continue taking the Atenolol?

## 2015-05-24 ENCOUNTER — Telehealth: Payer: Self-pay | Admitting: Pharmacist Clinician (PhC)/ Clinical Pharmacy Specialist

## 2015-05-24 MED ORDER — AMLODIPINE BESYLATE 5 MG PO TABS: 5.0000 mg | ORAL_TABLET | Freq: Every day | ORAL | Status: DC

## 2015-05-24 MED ORDER — LISINOPRIL 40 MG PO TABS: 40.0000 mg | ORAL_TABLET | Freq: Every day | ORAL | Status: DC

## 2015-05-24 NOTE — Telephone Encounter (Signed)
Pt called, LMOM regarding BP  Returned call, systolic BP still running high, 150-185.  Reviewed medications, patient stopped her amlodipine, should have cut back from 10 mg to 5 mg daily; also did not increase her lisinopril from 20 mg to 40 mg daily.  Advised she do these 2 changes and keep her appt with me on the 6th of December.  Patient voiced understanding.

## 2015-06-01 ENCOUNTER — Encounter: Payer: Self-pay | Admitting: Pharmacist Clinician (PhC)/ Clinical Pharmacy Specialist

## 2015-06-01 ENCOUNTER — Ambulatory Visit (INDEPENDENT_AMBULATORY_CARE_PROVIDER_SITE_OTHER): Payer: Medicare Other | Admitting: Pharmacist Clinician (PhC)/ Clinical Pharmacy Specialist

## 2015-06-01 VITALS — BP 156/58 | HR 52 | Ht 66.0 in | Wt 203.6 lb

## 2015-06-01 DIAGNOSIS — I10 Essential (primary) hypertension: Secondary | ICD-10-CM

## 2015-06-01 MED ORDER — CHLORTHALIDONE 25 MG PO TABS: 25.0000 mg | ORAL_TABLET | Freq: Every day | ORAL | Status: DC

## 2015-06-01 NOTE — Patient Instructions (Signed)
Return for a a follow up appointment in 1 month  Your blood pressure today is 156/58  (goal is < 150/90)  Check your blood pressure at home daily and keep record of the readings.  Take your BP meds as follows: add chlorthalidone 25 mg each morning; switch amlodipine 5 mg to evening/bedtime; leave all other medications the same  Bring all of your meds, your BP cuff and your record of home blood pressures to your next appointment.  Exercise as you're able, try to walk approximately 30 minutes per day.  Keep salt intake to a minimum, especially watch canned and prepared boxed foods.  Eat more fresh fruits and vegetables and fewer canned items.  Avoid eating in fast food restaurants.    HOW TO TAKE YOUR BLOOD PRESSURE: . Rest 5 minutes before taking your blood pressure. .  Don't smoke or drink caffeinated beverages for at least 30 minutes before. . Take your blood pressure before (not after) you eat. . Sit comfortably with your back supported and both feet on the floor (don't cross your legs). . Elevate your arm to heart level on a table or a desk. . Use the proper sized cuff. It should fit smoothly and snugly around your bare upper arm. There should be enough room to slip a fingertip under the cuff. The bottom edge of the cuff should be 1 inch above the crease of the elbow. . Ideally, take 3 measurements at one sitting and record the average.

## 2015-06-01 NOTE — Progress Notes (Signed)
06/01/2015 PALMA PAULHAMUS Dec 09, 1945 [id number]   HPI:  BARB PATRIDGE is a 69 y.o. female patient of Dr Debara Pickett, with a PMH below who presents today for hypertension clinic follow up.  She is here today with her daughter.  Dr. Debara Pickett originally increased her amlodipine to 10 mg, however this caused an increase in her lower extremity edema, so when I saw her last month we backed it off to 5 mg and increased her lisinopril from 20 to 40 mg.  There was some confusion with her medications and for about 10 days she continued with the lisinopril 20 and stopped the amlodipine altogether.  Since Nov 28 she has been on the correct medications and doses.  She reports the swelling in her legs has gone down considerably and has no other concerns today.  She takes all of her medications in the mornings, and only her second dose of Eliquis each evening.  Cardiac Hx: DM, NSTEMI (12/2013), bradycardia, bilateral carotid stenosis  Family Hx: mother had first MI in her late 58's died from another at 64; brother with MI at 37  Social Hx: does not smoke or drink alcohol, drinks caffeine on occasion  Diet: does eat low sodium, no pork, avoids canned vegetables  Exercise: has knee problems, unable to do much, but tries to walk on days it doesn't bother her too much  Current antihypertensive medications: amlodipine 5 mg qd, atenolol 12.5 mg qd, lisinopril 40 mg qd (takes all in am)  Home BP readings:  [id number] systolic; 123456 readings in past 2 weeks were 123456; [id number] diastolic readings WNL, highest reading was 93   Current Outpatient Prescriptions  Medication Sig Dispense Refill  . amitriptyline (ELAVIL) 25 MG tablet Take 25 mg by mouth at bedtime.     [name] amLODipine (NORVASC) 5 MG tablet Take 1 tablet (5 mg total) by mouth daily. 90 tablet 2  . apixaban (ELIQUIS) 5 MG TABS tablet Take 1 tablet (5 mg total) by mouth 2 (two) times daily. 60 tablet 6  . aspirin 81 MG chewable tablet Chew 81 mg by mouth  daily.    [name] atenolol (TENORMIN) 25 MG tablet Take 0.5 tablets (12.5 mg total) by mouth daily. 45 tablet 1  . atorvastatin (LIPITOR) 80 MG tablet TAKE ONE TABLET BY MOUTH IN THE EVENING. 30 tablet 6  . chlorthalidone (HYGROTON) 25 MG tablet Take 1 tablet (25 mg total) by mouth daily. 30 tablet 5  . Cholecalciferol (VITAMIN D-3) 1000 UNITS CAPS Take 1,000 Units by mouth every morning.     . clopidogrel (PLAVIX) 75 MG tablet Take 1 tablet (75 mg total) by mouth daily. 30 tablet 11  . clotrimazole-betamethasone (LOTRISONE) cream Apply 1 application topically 3 (three) times daily.    . colesevelam (WELCHOL) 625 MG tablet Take 1,875 mg by mouth 2 (two) times daily with a meal. *May take 6 tablets once a day with meals*    . furosemide (LASIX) 40 MG tablet Take 1 tablet (40 mg total) by mouth every morning.    [name] glimepiride (AMARYL) 2 MG tablet Take 6 mg by mouth daily with breakfast.     . HUMALOG 100 UNIT/ML injection Inject 10 Units into the skin 3 (three) times daily with meals.     . insulin glargine (LANTUS) 100 UNIT/ML injection Inject 27 Units into the skin at bedtime.    [name] lisinopril (PRINIVIL,ZESTRIL) 40 MG tablet Take 1 tablet (40 mg total) by mouth daily. 90 tablet 3  .  meclizine (ANTIVERT) 12.5 MG tablet Take 1 tablet (12.5 mg total) by mouth 3 (three) times daily as needed for dizziness. 30 tablet 0  . nitroGLYCERIN (NITROSTAT) 0.4 MG SL tablet Place 1 tablet (0.4 mg total) under the tongue every 5 (five) minutes as needed for chest pain (up to 3 doses). 25 tablet 3   No current facility-administered medications for this visit.    Allergies  Allergen Reactions  . Penicillins Hives    Has patient had a PCN reaction causing immediate rash, facial/tongue/throat swelling, SOB or lightheadedness with hypotension: no Has patient had a PCN reaction causing severe rash involving mucus membranes or skin necrosis: No  Has patient had a PCN reaction that required hospitalization: no Has patient  had a PCN reaction occurring within the last 10 years: no If all of the above answers are "NO", then may proceed with Cephalosporin use.     Past Medical History  Diagnosis Date  . Hypertension   . Type II diabetes mellitus ([place])   . TIA (transient ischemic attack) 1978  . Sickle cell trait ([place])   . Migraines     "weekly" (01/06/2014)  . Arthritis     "all over"  . CKD (chronic kidney disease), stage III   . CAD (coronary artery disease)     a. NSTEMI 12/2013 - occluded dominant RCA with L-R collaterals, moderate prox segmental LAD disease, for medical therapy initially, EF 60% with subtle inferobasal hypokinesia. Consider PCI for refractory CP.    Blood pressure 156/58, pulse 52, height 5\' 6"  (1.676 m), weight 203 lb 9.6 oz (92.352 kg).    Tommy Medal PharmD CPP [facility]

## 2015-06-01 NOTE — Assessment & Plan Note (Signed)
Today her home BP readings are much improved, but still just only 1/2 are at goal.  In office reading today 156/58.  I am going to add chlorthalidone 25 mg each morning, and have asked her to move the amlodipine to bedtime.  She is to continue regular home BP checks and I will see her back in a month for follow up.

## 2015-06-03 DIAGNOSIS — E1140 Type 2 diabetes mellitus with diabetic neuropathy, unspecified: Secondary | ICD-10-CM | POA: Diagnosis not present

## 2015-06-03 DIAGNOSIS — N183 Chronic kidney disease, stage 3 (moderate): Secondary | ICD-10-CM | POA: Diagnosis not present

## 2015-06-03 DIAGNOSIS — B353 Tinea pedis: Secondary | ICD-10-CM | POA: Diagnosis not present

## 2015-06-03 DIAGNOSIS — Z6831 Body mass index (BMI) 31.0-31.9, adult: Secondary | ICD-10-CM | POA: Diagnosis not present

## 2015-07-01 ENCOUNTER — Ambulatory Visit (INDEPENDENT_AMBULATORY_CARE_PROVIDER_SITE_OTHER): Payer: Medicare Other | Admitting: Pharmacist Clinician (PhC)/ Clinical Pharmacy Specialist

## 2015-07-01 ENCOUNTER — Other Ambulatory Visit: Payer: Self-pay | Admitting: Internal Medicine

## 2015-07-01 ENCOUNTER — Encounter: Payer: Self-pay | Admitting: Pharmacist Clinician (PhC)/ Clinical Pharmacy Specialist

## 2015-07-01 VITALS — BP 130/72 | HR 47 | Ht 66.0 in | Wt 205.8 lb

## 2015-07-01 DIAGNOSIS — I10 Essential (primary) hypertension: Secondary | ICD-10-CM | POA: Diagnosis not present

## 2015-07-01 LAB — BASIC METABOLIC PANEL
BUN: 30 mg/dL — ABNORMAL HIGH (ref 7–25)
CALCIUM: 10.5 mg/dL — AB (ref 8.6–10.4)
CO2: 27 mmol/L (ref 20–31)
CREATININE: 1.4 mg/dL — AB (ref 0.60–0.93)
Chloride: 108 mmol/L (ref 98–110)
GLUCOSE: 38 mg/dL — AB (ref 65–99)
Potassium: 4.9 mmol/L (ref 3.5–5.3)
SODIUM: 140 mmol/L (ref 135–146)

## 2015-07-01 NOTE — Progress Notes (Signed)
07/01/2015 Cassandra Adams 02-11-1946 [id number]   HPI:  Cassandra Adams is a 70 y.o. female patient of Dr Debara Pickett, with a PMH below who presents today for hypertension clinic follow up.  She is here today with her daughter.  She previously was on amlodipine 10 mg but developed lower extremity edema.  When the dose was cut back to 5 mg she did fine.  She continues on that dose.  At her last visit we added chlorthalidone 25 mg once daily and since then she has no concerns.    Cardiac Hx: DM, NSTEMI (12/2013), bradycardia, bilateral carotid stenosis  Family Hx: mother had first MI in her late 54's died from another at 25; brother with MI at 68  Social Hx: does not smoke or drink alcohol, drinks caffeine on occasion  Diet: does eat low sodium, no pork, avoids canned vegetables  Exercise: has knee problems, unable to do much, but tries to walk on days it doesn't bother her too much  Current antihypertensive medications: amlodipine 5 mg qd, atenolol 12.5 mg qd, lisinopril 40 mg qd (takes all in am)  Home BP readings:  Since starting chlorthalidone only 5 out of 24 readings were >150, with the highest at 158.  The majority of her systolic readings are [id number], diastolic readings all WNL.     Current Outpatient Prescriptions  Medication Sig Dispense Refill  . amitriptyline (ELAVIL) 25 MG tablet Take 25 mg by mouth at bedtime.     [name] amLODipine (NORVASC) 5 MG tablet Take 1 tablet (5 mg total) by mouth daily. 90 tablet 2  . apixaban (ELIQUIS) 5 MG TABS tablet Take 1 tablet (5 mg total) by mouth 2 (two) times daily. 60 tablet 6  . aspirin 81 MG chewable tablet Chew 81 mg by mouth daily.    [name] atenolol (TENORMIN) 25 MG tablet Take 0.5 tablets (12.5 mg total) by mouth daily. 45 tablet 1  . atorvastatin (LIPITOR) 80 MG tablet TAKE ONE TABLET BY MOUTH IN THE EVENING. 30 tablet 6  . chlorthalidone (HYGROTON) 25 MG tablet Take 1 tablet (25 mg total) by mouth daily. 30 tablet 5  .  Cholecalciferol (VITAMIN D-3) 1000 UNITS CAPS Take 1,000 Units by mouth every morning.     . clopidogrel (PLAVIX) 75 MG tablet Take 1 tablet (75 mg total) by mouth daily. 30 tablet 11  . clotrimazole-betamethasone (LOTRISONE) cream Apply 1 application topically 3 (three) times daily.    . colesevelam (WELCHOL) 625 MG tablet Take 1,875 mg by mouth 2 (two) times daily with a meal. *May take 6 tablets once a day with meals*    . furosemide (LASIX) 40 MG tablet Take 1 tablet (40 mg total) by mouth every morning.    [name] glimepiride (AMARYL) 2 MG tablet Take 6 mg by mouth daily with breakfast.     . HUMALOG 100 UNIT/ML injection Inject 10 Units into the skin 3 (three) times daily with meals.     . insulin glargine (LANTUS) 100 UNIT/ML injection Inject 27 Units into the skin at bedtime.    [name] lisinopril (PRINIVIL,ZESTRIL) 40 MG tablet Take 1 tablet (40 mg total) by mouth daily. 90 tablet 3  . meclizine (ANTIVERT) 12.5 MG tablet Take 1 tablet (12.5 mg total) by mouth 3 (three) times daily as needed for dizziness. 30 tablet 0  . nitroGLYCERIN (NITROSTAT) 0.4 MG SL tablet Place 1 tablet (0.4 mg total) under the tongue every 5 (five) minutes as needed for chest pain (  up to 3 doses). 25 tablet 3   No current facility-administered medications for this visit.    Allergies  Allergen Reactions  . Penicillins Hives    Has patient had a PCN reaction causing immediate rash, facial/tongue/throat swelling, SOB or lightheadedness with hypotension: no Has patient had a PCN reaction causing severe rash involving mucus membranes or skin necrosis: No  Has patient had a PCN reaction that required hospitalization: no Has patient had a PCN reaction occurring within the last 10 years: no If all of the above answers are "NO", then may proceed with Cephalosporin use.     Past Medical History  Diagnosis Date  . Hypertension   . Type II diabetes mellitus ([place])   . TIA (transient ischemic attack) 1978  . Sickle cell trait  ([place])   . Migraines     "weekly" (01/06/2014)  . Arthritis     "all over"  . CKD (chronic kidney disease), stage III   . CAD (coronary artery disease)     a. NSTEMI 12/2013 - occluded dominant RCA with L-R collaterals, moderate prox segmental LAD disease, for medical therapy initially, EF 60% with subtle inferobasal hypokinesia. Consider PCI for refractory CP.    Blood pressure 130/72, pulse 47, height 5\' 6"  (1.676 m), weight 205 lb 12.8 oz (93.35 kg).    Tommy Medal PharmD CPP [facility]

## 2015-07-01 NOTE — Assessment & Plan Note (Signed)
Blood pressure is much improved since addition of chlorthalidone to her amlodipine, atenolol and lisinopril.  She will continue with this regimen and home blood pressure checks 3-4 times per week.  She is to call if she sees the readings trend to [id number] systolic.  She will go back to the lab today for a repeat BMET.

## 2015-07-01 NOTE — Patient Instructions (Signed)
  Your blood pressure today is 118/60  (goal is < 150/90)  Check your blood pressure at home daily (if able) and keep record of the readings.  Take your BP meds as follows: continue with all current medications  Bring all of your meds, your BP cuff and your record of home blood pressures to your next appointment.  Exercise as you're able, try to walk approximately 30 minutes per day.  Keep salt intake to a minimum, especially watch canned and prepared boxed foods.  Eat more fresh fruits and vegetables and fewer canned items.  Avoid eating in fast food restaurants.    HOW TO TAKE YOUR BLOOD PRESSURE: . Rest 5 minutes before taking your blood pressure. .  Don't smoke or drink caffeinated beverages for at least 30 minutes before. . Take your blood pressure before (not after) you eat. . Sit comfortably with your back supported and both feet on the floor (don't cross your legs). . Elevate your arm to heart level on a table or a desk. . Use the proper sized cuff. It should fit smoothly and snugly around your bare upper arm. There should be enough room to slip a fingertip under the cuff. The bottom edge of the cuff should be 1 inch above the crease of the elbow. . Ideally, take 3 measurements at one sitting and record the average.

## 2015-07-02 ENCOUNTER — Telehealth: Payer: Self-pay | Admitting: Internal Medicine

## 2015-07-02 NOTE — Telephone Encounter (Signed)
Call from Lab pt Glucose yesterday with blood draw, 38.    Pt contacted she stated she felt weak and dizzy but she was feeling fine now.  She stated she has been taking her insulin as ordered but she commonly does not eat a lot for break fast and most mornings her blood sugar is in the 40's-60's. Educated her on foods to eat to help blood sugar and if it continues to be this low in the morning and she feels bad then she should contact her PCP for medication management. Pt verbalized understanding, no questions at this time.

## 2015-07-23 ENCOUNTER — Other Ambulatory Visit: Payer: Self-pay | Admitting: Internal Medicine

## 2015-07-23 NOTE — Telephone Encounter (Signed)
Rx request sent to pharmacy.  

## 2015-09-24 ENCOUNTER — Emergency Department (HOSPITAL_COMMUNITY)
Admission: EM | Admit: 2015-09-24 | Discharge: 2015-09-24 | Disposition: A | Discharge status: Home / Self Care | Payer: Medicare Other | Attending: Emergency Medicine | Admitting: Emergency Medicine

## 2015-09-24 ENCOUNTER — Emergency Department (HOSPITAL_COMMUNITY): Payer: Medicare Other

## 2015-09-24 ENCOUNTER — Encounter (HOSPITAL_COMMUNITY): Payer: Self-pay

## 2015-09-24 DIAGNOSIS — N183 Chronic kidney disease, stage 3 (moderate): Secondary | ICD-10-CM | POA: Diagnosis not present

## 2015-09-24 DIAGNOSIS — I129 Hypertensive chronic kidney disease with stage 1 through stage 4 chronic kidney disease, or unspecified chronic kidney disease: Secondary | ICD-10-CM | POA: Diagnosis not present

## 2015-09-24 DIAGNOSIS — R42 Dizziness and giddiness: Secondary | ICD-10-CM | POA: Diagnosis not present

## 2015-09-24 DIAGNOSIS — I2510 Atherosclerotic heart disease of native coronary artery without angina pectoris: Secondary | ICD-10-CM | POA: Insufficient documentation

## 2015-09-24 DIAGNOSIS — Z7982 Long term (current) use of aspirin: Secondary | ICD-10-CM | POA: Diagnosis not present

## 2015-09-24 DIAGNOSIS — H6121 Impacted cerumen, right ear: Secondary | ICD-10-CM | POA: Diagnosis not present

## 2015-09-24 DIAGNOSIS — Z794 Long term (current) use of insulin: Secondary | ICD-10-CM | POA: Insufficient documentation

## 2015-09-24 DIAGNOSIS — Z7984 Long term (current) use of oral hypoglycemic drugs: Secondary | ICD-10-CM | POA: Diagnosis not present

## 2015-09-24 DIAGNOSIS — Z79899 Other long term (current) drug therapy: Secondary | ICD-10-CM | POA: Diagnosis not present

## 2015-09-24 DIAGNOSIS — E1122 Type 2 diabetes mellitus with diabetic chronic kidney disease: Secondary | ICD-10-CM | POA: Diagnosis not present

## 2015-09-24 DIAGNOSIS — R404 Transient alteration of awareness: Secondary | ICD-10-CM | POA: Diagnosis not present

## 2015-09-24 LAB — BASIC METABOLIC PANEL
Anion gap: 8 (ref 5–15)
BUN: 26 mg/dL — ABNORMAL HIGH (ref 6–20)
CHLORIDE: 104 mmol/L (ref 101–111)
CO2: 28 mmol/L (ref 22–32)
CREATININE: 1.25 mg/dL — AB (ref 0.44–1.00)
Calcium: 10.1 mg/dL (ref 8.9–10.3)
GFR calc non Af Amer: 43 mL/min — ABNORMAL LOW (ref >60)
GFR, EST AFRICAN AMERICAN: 49 mL/min — AB (ref >60)
Glucose, Bld: 119 mg/dL — ABNORMAL HIGH (ref 65–99)
POTASSIUM: 4 mmol/L (ref 3.5–5.1)
Sodium: 140 mmol/L (ref 135–145)

## 2015-09-24 LAB — CBC WITH DIFFERENTIAL/PLATELET
BASOS PCT: 1 %
Basophils Absolute: 0 10*3/uL (ref 0.0–0.1)
EOS ABS: 0.2 10*3/uL (ref 0.0–0.7)
Eosinophils Relative: 4 %
HEMATOCRIT: 35.3 % — AB (ref 36.0–46.0)
HEMOGLOBIN: 12.5 g/dL (ref 12.0–15.0)
Lymphocytes Relative: 29 %
Lymphs Abs: 1.4 10*3/uL (ref 0.7–4.0)
MCH: 28.3 pg (ref 26.0–34.0)
MCHC: 35.4 g/dL (ref 30.0–36.0)
MCV: 80 fL (ref 78.0–100.0)
MONO ABS: 0.5 10*3/uL (ref 0.1–1.0)
MONOS PCT: 10 %
Neutro Abs: 2.7 10*3/uL (ref 1.7–7.7)
Neutrophils Relative %: 56 %
Platelets: 185 10*3/uL (ref 150–400)
RBC: 4.41 MIL/uL (ref 3.87–5.11)
RDW: 14.2 % (ref 11.5–15.5)
WBC: 4.7 10*3/uL (ref 4.0–10.5)

## 2015-09-24 LAB — TROPONIN I: Troponin I: <0.03 ng/mL (ref <0.031)

## 2015-09-24 MED ORDER — DIAZEPAM 2 MG PO TABS: 2.0000 mg | ORAL_TABLET | Freq: Two times a day (BID) | ORAL | Status: DC

## 2015-09-24 MED ORDER — DIAZEPAM 2 MG PO TABS
2.0000 mg | ORAL_TABLET | Freq: Once | ORAL | Status: AC
  Administered 2015-09-24: 2 mg via ORAL
  Filled 2015-09-24: qty 1

## 2015-09-24 MED ORDER — HYDROGEN PEROXIDE 3 % EX SOLN
CUTANEOUS | Status: AC
  Filled 2015-09-24: qty 473

## 2015-09-24 MED ORDER — MECLIZINE HCL 12.5 MG PO TABS
12.5000 mg | ORAL_TABLET | Freq: Once | ORAL | Status: AC
  Administered 2015-09-24: 12.5 mg via ORAL
  Filled 2015-09-24: qty 1

## 2015-09-24 MED ORDER — MECLIZINE HCL 12.5 MG PO TABS
12.5000 mg | ORAL_TABLET | Freq: Three times a day (TID) | ORAL | Status: DC | PRN
Start: 2015-09-24 — End: 2017-06-18

## 2015-09-24 MED ORDER — MECLIZINE HCL 12.5 MG PO TABS
25.0000 mg | ORAL_TABLET | Freq: Once | ORAL | Status: AC
  Administered 2015-09-24: 25 mg via ORAL
  Filled 2015-09-24: qty 2

## 2015-09-24 NOTE — ED Notes (Signed)
Ear irrigation completed. Pt taken to restroom in wheelchair and given snack of diet coke, peanut butter and graham crackers upon request. Pt will attempt to ambulate after eating.

## 2015-09-24 NOTE — ED Notes (Signed)
Pt tolerated ambulation poorly with only taking 3 steps and c/o dizziness, unable to continue walking. Pt redirected back to stretcher and comfort measures.

## 2015-09-24 NOTE — Discharge Instructions (Signed)
Benign Positional Vertigo There is no evidence of stroke. Follow up with your doctor. Return to the eD if you develop new or worsening symptoms. Vertigo is the feeling that you or your surroundings are moving when they are not. Benign positional vertigo is the most common form of vertigo. The cause of this condition is not serious (is benign). This condition is triggered by certain movements and positions (is positional). This condition can be dangerous if it occurs while you are doing something that could endanger you or others, such as driving.  CAUSES In many cases, the cause of this condition is not known. It may be caused by a disturbance in an area of the inner ear that helps your brain to sense movement and balance. This disturbance can be caused by a viral infection (labyrinthitis), head injury, or repetitive motion. RISK FACTORS This condition is more likely to develop in:  Women.  People who are 49 years of age or older. SYMPTOMS Symptoms of this condition usually happen when you move your head or your eyes in different directions. Symptoms may start suddenly, and they usually last for less than a minute. Symptoms may include:  Loss of balance and falling.  Feeling like you are spinning or moving.  Feeling like your surroundings are spinning or moving.  Nausea and vomiting.  Blurred vision.  Dizziness.  Involuntary eye movement (nystagmus). Symptoms can be mild and cause only slight annoyance, or they can be severe and interfere with daily life. Episodes of benign positional vertigo may return (recur) over time, and they may be triggered by certain movements. Symptoms may improve over time. DIAGNOSIS This condition is usually diagnosed by medical history and a physical exam of the head, neck, and ears. You may be referred to a health care provider who specializes in ear, nose, and throat (ENT) problems (otolaryngologist) or a provider who specializes in disorders of the nervous  system (neurologist). You may have additional testing, including:  MRI.  A CT scan.  Eye movement tests. Your health care provider may ask you to change positions quickly while he or she watches you for symptoms of benign positional vertigo, such as nystagmus. Eye movement may be tested with an electronystagmogram (ENG), caloric stimulation, the Dix-Hallpike test, or the roll test.  An electroencephalogram (EEG). This records electrical activity in your brain.  Hearing tests. TREATMENT Usually, your health care provider will treat this by moving your head in specific positions to adjust your inner ear back to normal. Surgery may be needed in severe cases, but this is rare. In some cases, benign positional vertigo may resolve on its own in 2-4 weeks. HOME CARE INSTRUCTIONS Safety  Move slowly.Avoid sudden body or head movements.  Avoid driving.  Avoid operating heavy machinery.  Avoid doing any tasks that would be dangerous to you or others if a vertigo episode would occur.  If you have trouble walking or keeping your balance, try using a cane for stability. If you feel dizzy or unstable, sit down right away.  Return to your normal activities as told by your health care provider. Ask your health care provider what activities are safe for you. General Instructions  Take over-the-counter and prescription medicines only as told by your health care provider.  Avoid certain positions or movements as told by your health care provider.  Drink enough fluid to keep your urine clear or pale yellow.  Keep all follow-up visits as told by your health care provider. This is important. [place]  CARE IF:  You have a fever.  Your condition gets worse or you develop new symptoms.  Your family or friends notice any behavioral changes.  Your nausea or vomiting gets worse.  You have numbness or a "pins and needles" sensation. SEEK IMMEDIATE MEDICAL CARE IF:  You have difficulty  speaking or moving.  You are always dizzy.  You faint.  You develop severe headaches.  You have weakness in your legs or arms.  You have changes in your hearing or vision.  You develop a stiff neck.  You develop sensitivity to light.   This information is not intended to replace advice given to you by your health care provider. Make sure you discuss any questions you have with your health care provider.   Document Released: 03/20/2006 Document Revised: 03/03/2015 Document Reviewed: 10/05/2014 Elsevier Interactive Patient Education [employer].

## 2015-09-24 NOTE — ED Notes (Signed)
Pt complaining of high blood pressure and being dizzy . State she has vertigo and she has medication to take but she was so dizzy she couldn't get up to get it

## 2015-09-24 NOTE — ED Provider Notes (Signed)
CSN: [id number]     Arrival date & time 09/24/15  [id number] History  By signing my name below, I, Terressa Koyanagi, attest that this documentation has been prepared under the direction and in the presence of No att. providers found. Electronically Signed: Terressa Koyanagi, ED Scribe. 09/24/2015. 3:13 PM.  Chief Complaint  Patient presents with  . Dizziness   The history is provided by the patient. No language interpreter was used.   [id number] J., MD HPI Comments: CARABELLA WADDELL is a 70 y.o. female, with PMHx noted below including HTN, DMTII, TIA, CAD, migraines, and vertigo, who presents to the Emergency Department via ambulance complaining of recurrent worsening dizziness onset last night and worsening this morning around 3:30AM. Associated Sx include mild headache consistent with chronic migraines and "rolling in the ear." Pt denies taking any meds at home to alleviate her Sx. Pt reports she is compliant with all of her daily Rx meds, include daily blood thinner use (eliquis for Afib). Pt denies vomiting, fever, SOB, chest pain, visual disturbances, weakness, numbness, abd pain. Pt further denies any recent falls.   Past Medical History  Diagnosis Date  . Hypertension   . Type II diabetes mellitus ([place])   . TIA (transient ischemic attack) 1978  . Sickle cell trait ([place])   . Migraines     "weekly" (01/06/2014)  . Arthritis     "all over"  . CKD (chronic kidney disease), stage III   . CAD (coronary artery disease)     a. NSTEMI 12/2013 - occluded dominant RCA with L-R collaterals, moderate prox segmental LAD disease, for medical therapy initially, EF 60% with subtle inferobasal hypokinesia. Consider PCI for refractory CP.   Past Surgical History  Procedure Laterality Date  . Replacement total knee Right 05-03-06  . Tonsillectomy  1972  . Esophagogastroduodenoscopy  11/08/2007     Normal esophagus without evidence of Barrett, mass, erosion/ Normal stomach, duodenal bulb  . Gastric  motility study  11/13/2007    mildly delayed emptying subjectively, but normal  analysis-77% of tracer emptied at 2 hours  . Laparoscopic appendectomy N/A 09/15/2012    Procedure: APPENDECTOMY LAPAROSCOPIC;  Surgeon: [name], MD;  Location: [facility];  Service: General;  Laterality: N/A;  . Joint replacement    . Appendectomy  March 2014    had appendix frozen  . Cataract extraction w/ intraocular lens  implant, bilateral Bilateral 04/2013-05/2013  . Cardiac catheterization  "years ago" & 01/05/2014  . Tee without cardioversion N/A 03/17/2014    Procedure: TRANSESOPHAGEAL ECHOCARDIOGRAM (TEE);  Surgeon: [name], MD;  Location: [facility] ENDOSCOPY;  Service: Cardiovascular;  Laterality: N/A;  . Cardioversion N/A 03/17/2014    Procedure: CARDIOVERSION;  Surgeon: [name], MD;  Location: [facility] ENDOSCOPY;  Service: Cardiovascular;  Laterality: N/A;  . Left heart catheterization with coronary angiogram N/A 01/05/2014    Procedure: LEFT HEART CATHETERIZATION WITH CORONARY ANGIOGRAM;  Surgeon: [name], MD;  Location[facility];  Service: Cardiovascular;  Laterality: N/A;   Family History  Problem Relation Age of Onset  . Hyperlipidemia Mother   . Hypertension Mother   . Heart attack Mother   . Cancer Father     lung  . Hypertension Sister   . Diabetes Brother   . Heart disease Brother   . Hyperlipidemia Brother   . Hypertension Brother   . Heart attack Brother   . Other Brother     DVT   Social History  Substance Use Topics  .  Smoking status: Never Smoker   . Smokeless tobacco: Never Used  . Alcohol Use: No   OB History    Gravida Para Term Preterm AB TAB SAB Ectopic Multiple Living   2 2 2       2      Review of Systems  A complete 10 system review of systems was obtained and all systems are negative except as noted in the HPI and PMH.   Allergies  Penicillins  Home Medications   Prior to Admission medications   Medication Sig Start Date End Date Taking?  Authorizing Provider  amitriptyline (ELAVIL) 25 MG tablet Take 25 mg by mouth at bedtime.  10/17/12  Yes Rolland Porter, MD  amLODipine (NORVASC) 5 MG tablet Take 1 tablet (5 mg total) by mouth daily. 05/24/15  Yes [name], MD  aspirin 81 MG chewable tablet Chew 81 mg by mouth daily.   Yes Historical Provider, MD  atenolol (TENORMIN) 25 MG tablet Take 0.5 tablets (12.5 mg total) by mouth daily. 05/17/15  Yes [name], MD  atorvastatin (LIPITOR) 80 MG tablet TAKE ONE TABLET BY MOUTH IN THE EVENING. 02/26/15  Yes [name], MD  chlorthalidone (HYGROTON) 25 MG tablet Take 1 tablet (25 mg total) by mouth daily. 06/01/15  Yes [name], MD  Cholecalciferol (VITAMIN D-3) 1000 UNITS CAPS Take 1,000 Units by mouth every morning.    Yes Historical Provider, MD  clopidogrel (PLAVIX) 75 MG tablet Take 1 tablet (75 mg total) by mouth daily. 01/06/14  Yes Dayna N Dunn, PA-C  clotrimazole-betamethasone (LOTRISONE) cream Apply 1 application topically 3 (three) times daily. 01/13/14  Yes Historical Provider, MD  colesevelam (WELCHOL) 625 MG tablet Take 1,875 mg by mouth 2 (two) times daily with a meal. *May take 6 tablets once a day with meals*   Yes Historical Provider, MD  ELIQUIS 5 MG TABS tablet TAKE (1) TABLET BY MOUTH TWICE DAILY. 07/23/15  Yes [name], MD  furosemide (LASIX) 40 MG tablet Take 1 tablet (40 mg total) by mouth every morning. 01/07/14  Yes Dayna N Dunn, PA-C  glimepiride (AMARYL) 2 MG tablet Take 6 mg by mouth daily with breakfast.  08/15/14  Yes Historical Provider, MD  HUMALOG 100 UNIT/ML injection Inject 10 Units into the skin 3 (three) times daily with meals.  04/07/15  Yes Historical Provider, MD  insulin glargine (LANTUS) 100 UNIT/ML injection Inject 27 Units into the skin at bedtime.   Yes Historical Provider, MD  lisinopril (PRINIVIL,ZESTRIL) 40 MG tablet Take 1 tablet (40 mg total) by mouth daily. 05/24/15  Yes [name], MD  nitroGLYCERIN (NITROSTAT) 0.4 MG  SL tablet Place 1 tablet (0.4 mg total) under the tongue every 5 (five) minutes as needed for chest pain (up to 3 doses). 01/06/14  Yes Dayna N Dunn, PA-C  diazepam (VALIUM) 2 MG tablet Take 1 tablet (2 mg total) by mouth 2 (two) times daily. 09/24/15   Ezequiel Essex, MD  meclizine (ANTIVERT) 12.5 MG tablet Take 1 tablet (12.5 mg total) by mouth 3 (three) times daily as needed for dizziness. 09/24/15   Ezequiel Essex, MD   Triage Vitals: BP 145/61 mmHg  Pulse 58  Temp(Src) 97.7 F (36.5 C)  Resp 12  Ht 5\' 6"  (1.676 m)  Wt 200 lb (90.719 kg)  BMI 32.30 kg/m2  SpO2 99% Physical Exam  Constitutional: She is oriented to person, place, and time. She appears well-developed and well-nourished. No distress.  HENT:  Head: Normocephalic and atraumatic.  Mouth/Throat: Oropharynx is clear and moist. No oropharyngeal exudate.  Cerumen impaction on R  Eyes: Conjunctivae and EOM are normal. Pupils are equal, round, and reactive to light.  Neck: Normal range of motion. Neck supple.  No meningismus.  Cardiovascular: Normal rate, regular rhythm, normal heart sounds and intact distal pulses.   No murmur heard. Pulmonary/Chest: Effort normal and breath sounds normal. No respiratory distress.  Abdominal: Soft. There is no tenderness. There is no rebound and no guarding.  Musculoskeletal: Normal range of motion. She exhibits no edema or tenderness.  Neurological: She is alert and oriented to person, place, and time. No cranial nerve deficit. She exhibits normal muscle tone. Coordination normal.  No ataxia on finger to nose bilaterally. No pronator drift. 5/5 strength throughout. CN 2-12 intact.Equal grip strength. Sensation intact. No nystagmus, Gait and romberg unable to be tested.   Skin: Skin is warm.  Psychiatric: She has a normal mood and affect. Her behavior is normal.  Nursing note and vitals reviewed.   ED Course  Procedures (including critical care time) DIAGNOSTIC STUDIES: Oxygen Saturation  is 99% on ra, nl by my interpretation.    COORDINATION OF CARE: 9:15 AM: Discussed treatment plan which includes meds, labs, with pt at bedside; patient verbalizes understanding and agrees with treatment plan.  Labs Review Labs Reviewed  CBC WITH DIFFERENTIAL/PLATELET - Abnormal; Notable for the following:    HCT 35.3 (*)    All other components within normal limits  BASIC METABOLIC PANEL - Abnormal; Notable for the following:    Glucose, Bld 119 (*)    BUN 26 (*)    Creatinine, Ser 1.25 (*)    GFR calc non Af Amer 43 (*)    GFR calc Af Amer 49 (*)    All other components within normal limits  TROPONIN I    Imaging Review Mr Brain Wo Contrast  09/24/2015  CLINICAL DATA:  Hypertension and dizziness. EXAM: MRI HEAD WITHOUT CONTRAST TECHNIQUE: Multiplanar, multiecho pulse sequences of the brain and surrounding structures were obtained without intravenous contrast. COMPARISON:  MRI brain 02/12/2015. FINDINGS: No acute infarct, hemorrhage, or mass lesion is present. The ventricles are of normal size. No significant extraaxial fluid collection is present. Minimal white matter disease and atrophy is stable and likely within normal limits for age. The internal auditory canals are within normal limits. The brain stem and cerebellum are within normal limits. Flow is present in the major intracranial arteries. Chronic right maxillary sinus disease is present. The remaining sinuses are clear. Bilateral lens replacements are present. The globes and orbits are otherwise intact. IMPRESSION: 1. No acute intracranial abnormality or significant interval change. Electronically Signed   By: San Morelle M.D.   On: 09/24/2015 11:11   I have personally reviewed and evaluated these lab results as part of my medical decision-making.   EKG Interpretation   Date/Time:  Friday September 24 2015 08:54:04 EDT Ventricular Rate:  59 PR Interval:  163 QRS Duration: 95 QT Interval:  397 QTC Calculation: 393 R  Axis:   35 Text Interpretation:  Sinus rhythm Nonspecific T abnormalities, lateral  leads Minimal ST elevation, anterior leads Nonspecific T wave abnormality  Confirmed by Wyvonnia Dusky  MD, Maanya Hippert ([id number]) on 09/24/2015 9:05:36 AM      MDM   Final diagnoses:  Vertigo   Dizziness described as vertigo since last night. Too dizzy to get out of bed. History of similar episodes in the past. No chest pain or shortness  of breath. No focal weakness, numbness or tingling. On eliquis.  Nonfocal neurological exam. No nystagmus. Right ear is irrigated.  Dizziness persists after meclizine and Valium. She has difficulty taking more than 3 steps.  MRI obtained and shows no acute infarct or other abnormality.  Meclizine and Valium are repeated.   Patient now feeling improved and able to ambulate. No evidence of acute infarct. She is tolerating by mouth. No chest pain or shortness of breath.  Suspect peripheral vertigo. Treatment discussed with patient. PCP follow-up. No driving. Return precautions discussed.  I personally performed the services described in this documentation, which was scribed in my presence. The recorded information has been reviewed and is accurate.   Ezequiel Essex, MD 09/24/15 [phone]

## 2015-09-24 NOTE — ED Notes (Signed)
Patient ambulated  to restroom without assistance.

## 2015-09-30 DIAGNOSIS — R809 Proteinuria, unspecified: Secondary | ICD-10-CM | POA: Diagnosis not present

## 2015-09-30 DIAGNOSIS — D509 Iron deficiency anemia, unspecified: Secondary | ICD-10-CM | POA: Diagnosis not present

## 2015-09-30 DIAGNOSIS — E559 Vitamin D deficiency, unspecified: Secondary | ICD-10-CM | POA: Diagnosis not present

## 2015-09-30 DIAGNOSIS — I10 Essential (primary) hypertension: Secondary | ICD-10-CM | POA: Diagnosis not present

## 2015-09-30 DIAGNOSIS — N183 Chronic kidney disease, stage 3 (moderate): Secondary | ICD-10-CM | POA: Diagnosis not present

## 2015-10-06 DIAGNOSIS — R809 Proteinuria, unspecified: Secondary | ICD-10-CM | POA: Diagnosis not present

## 2015-10-06 DIAGNOSIS — E875 Hyperkalemia: Secondary | ICD-10-CM | POA: Diagnosis not present

## 2015-10-06 DIAGNOSIS — E559 Vitamin D deficiency, unspecified: Secondary | ICD-10-CM | POA: Diagnosis not present

## 2015-10-06 DIAGNOSIS — N183 Chronic kidney disease, stage 3 (moderate): Secondary | ICD-10-CM | POA: Diagnosis not present

## 2015-10-07 ENCOUNTER — Other Ambulatory Visit: Payer: Self-pay | Admitting: Internal Medicine

## 2015-10-07 DIAGNOSIS — I10 Essential (primary) hypertension: Secondary | ICD-10-CM | POA: Diagnosis not present

## 2015-10-07 DIAGNOSIS — B351 Tinea unguium: Secondary | ICD-10-CM | POA: Diagnosis not present

## 2015-10-07 DIAGNOSIS — E1151 Type 2 diabetes mellitus with diabetic peripheral angiopathy without gangrene: Secondary | ICD-10-CM | POA: Diagnosis not present

## 2015-10-07 DIAGNOSIS — Z1389 Encounter for screening for other disorder: Secondary | ICD-10-CM | POA: Diagnosis not present

## 2015-10-07 DIAGNOSIS — R201 Hypoesthesia of skin: Secondary | ICD-10-CM | POA: Diagnosis not present

## 2015-10-07 NOTE — Telephone Encounter (Signed)
Rx(s) sent to pharmacy electronically.  

## 2015-10-28 ENCOUNTER — Other Ambulatory Visit: Payer: Self-pay | Admitting: Internal Medicine

## 2015-10-28 NOTE — Telephone Encounter (Signed)
Rx(s) sent to pharmacy electronically.  

## 2015-11-09 DIAGNOSIS — H524 Presbyopia: Secondary | ICD-10-CM | POA: Diagnosis not present

## 2015-11-09 DIAGNOSIS — E119 Type 2 diabetes mellitus without complications: Secondary | ICD-10-CM | POA: Diagnosis not present

## 2015-11-09 DIAGNOSIS — E1129 Type 2 diabetes mellitus with other diabetic kidney complication: Secondary | ICD-10-CM | POA: Diagnosis not present

## 2015-11-09 DIAGNOSIS — Z961 Presence of intraocular lens: Secondary | ICD-10-CM | POA: Diagnosis not present

## 2015-12-03 ENCOUNTER — Encounter (HOSPITAL_COMMUNITY): Payer: Self-pay | Admitting: *Deleted

## 2015-12-03 ENCOUNTER — Emergency Department (HOSPITAL_COMMUNITY)
Admission: EM | Admit: 2015-12-03 | Discharge: 2015-12-04 | Disposition: A | Discharge status: Home / Self Care | Payer: Medicare Other | Attending: Emergency Medicine | Admitting: Emergency Medicine

## 2015-12-03 DIAGNOSIS — M79604 Pain in right leg: Secondary | ICD-10-CM | POA: Diagnosis present

## 2015-12-03 DIAGNOSIS — N183 Chronic kidney disease, stage 3 (moderate): Secondary | ICD-10-CM | POA: Insufficient documentation

## 2015-12-03 DIAGNOSIS — Z7982 Long term (current) use of aspirin: Secondary | ICD-10-CM | POA: Insufficient documentation

## 2015-12-03 DIAGNOSIS — E119 Type 2 diabetes mellitus without complications: Secondary | ICD-10-CM | POA: Diagnosis not present

## 2015-12-03 DIAGNOSIS — Z8673 Personal history of transient ischemic attack (TIA), and cerebral infarction without residual deficits: Secondary | ICD-10-CM | POA: Diagnosis not present

## 2015-12-03 DIAGNOSIS — M1990 Unspecified osteoarthritis, unspecified site: Secondary | ICD-10-CM | POA: Diagnosis not present

## 2015-12-03 DIAGNOSIS — R252 Cramp and spasm: Secondary | ICD-10-CM | POA: Diagnosis not present

## 2015-12-03 DIAGNOSIS — M79651 Pain in right thigh: Secondary | ICD-10-CM | POA: Diagnosis not present

## 2015-12-03 DIAGNOSIS — I2510 Atherosclerotic heart disease of native coronary artery without angina pectoris: Secondary | ICD-10-CM | POA: Diagnosis not present

## 2015-12-03 DIAGNOSIS — M25551 Pain in right hip: Secondary | ICD-10-CM | POA: Diagnosis not present

## 2015-12-03 DIAGNOSIS — I129 Hypertensive chronic kidney disease with stage 1 through stage 4 chronic kidney disease, or unspecified chronic kidney disease: Secondary | ICD-10-CM | POA: Insufficient documentation

## 2015-12-03 DIAGNOSIS — Z794 Long term (current) use of insulin: Secondary | ICD-10-CM | POA: Diagnosis not present

## 2015-12-03 DIAGNOSIS — Z79899 Other long term (current) drug therapy: Secondary | ICD-10-CM | POA: Insufficient documentation

## 2015-12-03 DIAGNOSIS — R609 Edema, unspecified: Secondary | ICD-10-CM | POA: Diagnosis not present

## 2015-12-03 DIAGNOSIS — M79606 Pain in leg, unspecified: Secondary | ICD-10-CM | POA: Diagnosis not present

## 2015-12-03 MED ORDER — ACETAMINOPHEN 325 MG PO TABS
650.0000 mg | ORAL_TABLET | Freq: Once | ORAL | Status: AC
  Administered 2015-12-03: 650 mg via ORAL
  Filled 2015-12-03: qty 2

## 2015-12-03 MED ORDER — HYDROCODONE-ACETAMINOPHEN 5-325 MG PO TABS
1.0000 | ORAL_TABLET | Freq: Once | ORAL | Status: AC
  Administered 2015-12-03: 1 via ORAL
  Filled 2015-12-03: qty 1

## 2015-12-03 NOTE — ED Notes (Signed)
Pt c/o pain to bilateral lower legs, worse in the right, swelling also noted to lower legs, pt states that her pain started today, has had problems with low potassium in the past, currently takes lasix for "swelling"

## 2015-12-03 NOTE — ED Notes (Signed)
Pt reports having cramps in her right thigh since about 7 pm.

## 2015-12-03 NOTE — ED Provider Notes (Signed)
CSN: [id number]     Arrival date & time 12/03/15  2128 History  By signing my name below, I, Dora Sims, attest that this documentation has been prepared under the direction and in the presence of physician practitioner, Jola Schmidt, MD. Electronically Signed: Dora Sims, Scribe. 12/03/2015. 11:41 PM.   Chief Complaint  Patient presents with  . leg cramps     The history is provided by the patient. No language interpreter was used.     HPI Comments: Cassandra Adams is a 70 y.o. female brought in by her son, with h/o DM type II, arthritis, CKD, and CAD, who presents to the Emergency Department complaining of sudden onset, intermittent, cramping, bilateral leg pain beginning around 6 hours ago. Pt reports that the cramping in her right leg is worse than her left; she states the most severe cramping is in her posterior right thigh. Pt describes the cramps as a "tight, pulling" sensation. She also notes swelling around her bilateral ankles which is chronic. Pt ate 3 spoonfuls of mustard for her symptoms, which she states usually relieves her cramps, with no relief. Pt endorses that she drinks a lot of soda; she does not drink water often. She attended her granddaughter's graduation today and endorses that she ambulated and exerted herself more than usual. Pt takes aspirin x2 daily. She denies abdominal pain, chest pain, SOB, or any other associated symptoms.  Past Medical History  Diagnosis Date  . Hypertension   . Type II diabetes mellitus ([place])   . TIA (transient ischemic attack) 1978  . Sickle cell trait ([place])   . Migraines     "weekly" (01/06/2014)  . Arthritis     "all over"  . CKD (chronic kidney disease), stage III   . CAD (coronary artery disease)     a. NSTEMI 12/2013 - occluded dominant RCA with L-R collaterals, moderate prox segmental LAD disease, for medical therapy initially, EF 60% with subtle inferobasal hypokinesia. Consider PCI for refractory CP.   Past Surgical  History  Procedure Laterality Date  . Replacement total knee Right 05-03-06  . Tonsillectomy  1972  . Esophagogastroduodenoscopy  11/08/2007     Normal esophagus without evidence of Barrett, mass, erosion/ Normal stomach, duodenal bulb  . Gastric motility study  11/13/2007    mildly delayed emptying subjectively, but normal  analysis-77% of tracer emptied at 2 hours  . Laparoscopic appendectomy N/A 09/15/2012    Procedure: APPENDECTOMY LAPAROSCOPIC;  Surgeon: [name], MD;  Location: [facility];  Service: General;  Laterality: N/A;  . Joint replacement    . Appendectomy  March 2014    had appendix frozen  . Cataract extraction w/ intraocular lens  implant, bilateral Bilateral 04/2013-05/2013  . Cardiac catheterization  "years ago" & 01/05/2014  . Tee without cardioversion N/A 03/17/2014    Procedure: TRANSESOPHAGEAL ECHOCARDIOGRAM (TEE);  Surgeon: [name], MD;  Location: [facility] ENDOSCOPY;  Service: Cardiovascular;  Laterality: N/A;  . Cardioversion N/A 03/17/2014    Procedure: CARDIOVERSION;  Surgeon: [name], MD;  Location: [facility] ENDOSCOPY;  Service: Cardiovascular;  Laterality: N/A;  . Left heart catheterization with coronary angiogram N/A 01/05/2014    Procedure: LEFT HEART CATHETERIZATION WITH CORONARY ANGIOGRAM;  Surgeon: [name], MD;  Location: [facility] CATH LAB;  Service: Cardiovascular;  Laterality: N/A;   Family History  Problem Relation Age of Onset  . Hyperlipidemia Mother   . Hypertension Mother   . Heart attack Mother   . Cancer Father  lung  . Hypertension Sister   . Diabetes Brother   . Heart disease Brother   . Hyperlipidemia Brother   . Hypertension Brother   . Heart attack Brother   . Other Brother     DVT   Social History  Substance Use Topics  . Smoking status: Never Smoker   . Smokeless tobacco: Never Used  . Alcohol Use: No   OB History    Gravida Para Term Preterm AB TAB SAB Ectopic Multiple Living   2 2 2       2      Review of  Systems  A complete 10 system review of systems was obtained and all systems are negative except as noted in the HPI and PMH.   Allergies  Penicillins  Home Medications   Prior to Admission medications   Medication Sig Start Date End Date Taking? Authorizing Provider  amitriptyline (ELAVIL) 25 MG tablet Take 25 mg by mouth at bedtime.  10/17/12  Yes Rolland Porter, MD  amLODipine (NORVASC) 10 MG tablet Take 10 mg by mouth every evening.  10/28/15  Yes Historical Provider, MD  aspirin 81 MG chewable tablet Chew 81 mg by mouth daily.   Yes Historical Provider, MD  atenolol (TENORMIN) 25 MG tablet Take 0.5 tablets (12.5 mg total) by mouth daily. Patient taking differently: Take 25 mg by mouth daily.  05/17/15  Yes [name], MD  atorvastatin (LIPITOR) 80 MG tablet Take 1 tablet (80 mg total) by mouth every evening. <PLEASE MAKE APPOINTMENT FOR REFILLS> 10/07/15  Yes [name], MD  chlorthalidone (HYGROTON) 25 MG tablet Take 1 tablet (25 mg total) by mouth daily. <PLEASE MAKE APPOINTMENT FOR REFILLS> 10/28/15  Yes [name], MD  Cholecalciferol (VITAMIN D-3) 1000 UNITS CAPS Take 1,000 Units by mouth every morning.    Yes Historical Provider, MD  clopidogrel (PLAVIX) 75 MG tablet Take 1 tablet (75 mg total) by mouth daily. 01/06/14  Yes Dayna N Dunn, PA-C  clotrimazole-betamethasone (LOTRISONE) cream Apply 1 application topically 2 (two) times daily.  01/13/14  Yes Historical Provider, MD  colesevelam (WELCHOL) 625 MG tablet Take 1,875 mg by mouth 2 (two) times daily with a meal. *May take 6 tablets once a day with meals*   Yes Historical Provider, MD  ELIQUIS 5 MG TABS tablet TAKE (1) TABLET BY MOUTH TWICE DAILY. 07/23/15  Yes [name], MD  furosemide (LASIX) 40 MG tablet Take 1 tablet (40 mg total) by mouth every morning. 01/07/14  Yes Dayna N Dunn, PA-C  glimepiride (AMARYL) 2 MG tablet Take 1 mg by mouth daily with breakfast.  08/15/14  Yes Historical Provider, MD  insulin aspart  (NOVOLOG) 100 UNIT/ML injection Inject 1-20 Units into the skin daily as needed for high blood sugar.   Yes Historical Provider, MD  lisinopril (PRINIVIL,ZESTRIL) 10 MG tablet Take 10 mg by mouth daily. 10/28/15  Yes Historical Provider, MD  lisinopril (PRINIVIL,ZESTRIL) 40 MG tablet Take 1 tablet (40 mg total) by mouth daily. Patient taking differently: Take 20 mg by mouth every morning.  05/24/15  Yes [name], MD  meclizine (ANTIVERT) 12.5 MG tablet Take 1 tablet (12.5 mg total) by mouth 3 (three) times daily as needed for dizziness. 09/24/15  Yes Ezequiel Essex, MD  nitroGLYCERIN (NITROSTAT) 0.4 MG SL tablet Place 1 tablet (0.4 mg total) under the tongue every 5 (five) minutes as needed for chest pain (up to 3 doses). 01/06/14  Yes Charlie Pitter, PA-C  diazepam (VALIUM) 2 MG tablet Take 1 tablet (2 mg total) by mouth 2 (two) times daily. Patient not taking: Reported on 12/03/2015 09/24/15   Ezequiel Essex, MD   BP 137/55 mmHg  Pulse 59  Temp(Src) 98.1 F (36.7 C) (Oral)  Resp 14  Ht 5\' 7"  (1.702 m)  Wt 207 lb (93.895 kg)  BMI 32.41 kg/m2  SpO2 99% Physical Exam  Constitutional: She is oriented to person, place, and time. She appears well-developed and well-nourished.  HENT:  Head: Normocephalic.  Eyes: EOM are normal.  Neck: Normal range of motion.  Pulmonary/Chest: Effort normal.  Abdominal: She exhibits no distension.  Musculoskeletal: Normal range of motion.  FROM of right hip and right knee, Normal PT and DP pulse right foot. No rash or erythema noted of RLE. No swelling of the RLE as compared to the left.  Neurological: She is alert and oriented to person, place, and time.  Psychiatric: She has a normal mood and affect.  Nursing note and vitals reviewed.   ED Course  Procedures (including critical care time)  DIAGNOSTIC STUDIES: Oxygen Saturation is 99% on RA, normal by my interpretation.    COORDINATION OF CARE: 11:41 PM Discussed treatment plan with pt at  bedside and pt agreed to plan.  Labs Review Labs Reviewed - No data to display  Imaging Review No results found. I have personally reviewed and evaluated these images and lab results as part of my medical decision-making.   EKG Interpretation None      MDM   Final diagnoses:  Right thigh pain    The majority pain was located in the right upper leg.  Full range of motion of major joints.  No unilateral leg swelling.  Normal bilateral PT and DP pulses.  I suspect the patient overdid a graduation today and much of this is muscle soreness.  No erythema or fluctuance noted.  Doubt myositis or deep space infection.  Primary care follow-up.  Treated with anti-inflammatories  I personally performed the services described in this documentation, which was scribed in my presence. The recorded information has been reviewed and is accurate.      Jola Schmidt, MD 12/04/15 [phone]

## 2015-12-03 NOTE — Discharge Instructions (Signed)

## 2015-12-09 DIAGNOSIS — I10 Essential (primary) hypertension: Secondary | ICD-10-CM | POA: Diagnosis not present

## 2015-12-09 DIAGNOSIS — Z1389 Encounter for screening for other disorder: Secondary | ICD-10-CM | POA: Diagnosis not present

## 2015-12-09 DIAGNOSIS — E1129 Type 2 diabetes mellitus with other diabetic kidney complication: Secondary | ICD-10-CM | POA: Diagnosis not present

## 2015-12-09 DIAGNOSIS — R252 Cramp and spasm: Secondary | ICD-10-CM | POA: Diagnosis not present

## 2015-12-19 ENCOUNTER — Emergency Department (HOSPITAL_COMMUNITY): Payer: Medicare Other

## 2015-12-19 ENCOUNTER — Emergency Department (HOSPITAL_COMMUNITY)
Admission: EM | Admit: 2015-12-19 | Discharge: 2015-12-19 | Disposition: A | Discharge status: Home / Self Care | Payer: Medicare Other | Attending: Emergency Medicine | Admitting: Emergency Medicine

## 2015-12-19 ENCOUNTER — Other Ambulatory Visit: Payer: Self-pay

## 2015-12-19 ENCOUNTER — Encounter (HOSPITAL_COMMUNITY): Payer: Self-pay | Admitting: Cardiology

## 2015-12-19 DIAGNOSIS — N390 Urinary tract infection, site not specified: Secondary | ICD-10-CM | POA: Insufficient documentation

## 2015-12-19 DIAGNOSIS — M25562 Pain in left knee: Secondary | ICD-10-CM | POA: Diagnosis not present

## 2015-12-19 DIAGNOSIS — I2510 Atherosclerotic heart disease of native coronary artery without angina pectoris: Secondary | ICD-10-CM | POA: Insufficient documentation

## 2015-12-19 DIAGNOSIS — M1990 Unspecified osteoarthritis, unspecified site: Secondary | ICD-10-CM | POA: Diagnosis not present

## 2015-12-19 DIAGNOSIS — Z79899 Other long term (current) drug therapy: Secondary | ICD-10-CM | POA: Insufficient documentation

## 2015-12-19 DIAGNOSIS — Z7984 Long term (current) use of oral hypoglycemic drugs: Secondary | ICD-10-CM | POA: Diagnosis not present

## 2015-12-19 DIAGNOSIS — S0990XA Unspecified injury of head, initial encounter: Secondary | ICD-10-CM | POA: Diagnosis not present

## 2015-12-19 DIAGNOSIS — M79605 Pain in left leg: Secondary | ICD-10-CM | POA: Diagnosis present

## 2015-12-19 DIAGNOSIS — Z7982 Long term (current) use of aspirin: Secondary | ICD-10-CM | POA: Diagnosis not present

## 2015-12-19 DIAGNOSIS — Y939 Activity, unspecified: Secondary | ICD-10-CM | POA: Insufficient documentation

## 2015-12-19 DIAGNOSIS — W19XXXA Unspecified fall, initial encounter: Secondary | ICD-10-CM | POA: Insufficient documentation

## 2015-12-19 DIAGNOSIS — Z794 Long term (current) use of insulin: Secondary | ICD-10-CM | POA: Diagnosis not present

## 2015-12-19 DIAGNOSIS — G4489 Other headache syndrome: Secondary | ICD-10-CM | POA: Diagnosis not present

## 2015-12-19 DIAGNOSIS — N183 Chronic kidney disease, stage 3 (moderate): Secondary | ICD-10-CM | POA: Insufficient documentation

## 2015-12-19 DIAGNOSIS — Y929 Unspecified place or not applicable: Secondary | ICD-10-CM | POA: Diagnosis not present

## 2015-12-19 DIAGNOSIS — R51 Headache: Secondary | ICD-10-CM | POA: Insufficient documentation

## 2015-12-19 DIAGNOSIS — M1712 Unilateral primary osteoarthritis, left knee: Secondary | ICD-10-CM | POA: Insufficient documentation

## 2015-12-19 DIAGNOSIS — I129 Hypertensive chronic kidney disease with stage 1 through stage 4 chronic kidney disease, or unspecified chronic kidney disease: Secondary | ICD-10-CM | POA: Insufficient documentation

## 2015-12-19 DIAGNOSIS — R1084 Generalized abdominal pain: Secondary | ICD-10-CM | POA: Diagnosis not present

## 2015-12-19 DIAGNOSIS — E1122 Type 2 diabetes mellitus with diabetic chronic kidney disease: Secondary | ICD-10-CM | POA: Diagnosis not present

## 2015-12-19 DIAGNOSIS — Y999 Unspecified external cause status: Secondary | ICD-10-CM | POA: Insufficient documentation

## 2015-12-19 DIAGNOSIS — S8992XA Unspecified injury of left lower leg, initial encounter: Secondary | ICD-10-CM | POA: Diagnosis not present

## 2015-12-19 DIAGNOSIS — M25462 Effusion, left knee: Secondary | ICD-10-CM | POA: Diagnosis not present

## 2015-12-19 LAB — CBC
HCT: 36.6 % (ref 36.0–46.0)
HEMOGLOBIN: 12.8 g/dL (ref 12.0–15.0)
MCH: 28.3 pg (ref 26.0–34.0)
MCHC: 35 g/dL (ref 30.0–36.0)
MCV: 81 fL (ref 78.0–100.0)
Platelets: 178 10*3/uL (ref 150–400)
RBC: 4.52 MIL/uL (ref 3.87–5.11)
RDW: 13.8 % (ref 11.5–15.5)
WBC: 5.5 10*3/uL (ref 4.0–10.5)

## 2015-12-19 LAB — COMPREHENSIVE METABOLIC PANEL
ALT: 24 U/L (ref 14–54)
ANION GAP: 4 — AB (ref 5–15)
AST: 33 U/L (ref 15–41)
Albumin: 3.6 g/dL (ref 3.5–5.0)
Alkaline Phosphatase: 85 U/L (ref 38–126)
BILIRUBIN TOTAL: 0.5 mg/dL (ref 0.3–1.2)
BUN: 26 mg/dL — AB (ref 6–20)
CALCIUM: 9.8 mg/dL (ref 8.9–10.3)
CO2: 30 mmol/L (ref 22–32)
Chloride: 102 mmol/L (ref 101–111)
Creatinine, Ser: 1.35 mg/dL — ABNORMAL HIGH (ref 0.44–1.00)
GFR, EST AFRICAN AMERICAN: 45 mL/min — AB (ref >60)
GFR, EST NON AFRICAN AMERICAN: 38 mL/min — AB (ref >60)
GLUCOSE: 269 mg/dL — AB (ref 65–99)
POTASSIUM: 4.5 mmol/L (ref 3.5–5.1)
Sodium: 136 mmol/L (ref 135–145)
TOTAL PROTEIN: 7.1 g/dL (ref 6.5–8.1)

## 2015-12-19 LAB — URINALYSIS, ROUTINE W REFLEX MICROSCOPIC
BILIRUBIN URINE: NEGATIVE
Glucose, UA: 250 mg/dL — AB
KETONES UR: NEGATIVE mg/dL
NITRITE: POSITIVE — AB
Protein, ur: NEGATIVE mg/dL
Specific Gravity, Urine: <1.005 — ABNORMAL LOW (ref 1.005–1.030)
pH: 6.5 (ref 5.0–8.0)

## 2015-12-19 LAB — URINE MICROSCOPIC-ADD ON

## 2015-12-19 MED ORDER — HYDROCODONE-ACETAMINOPHEN 5-325 MG PO TABS
1.0000 | ORAL_TABLET | Freq: Once | ORAL | Status: AC
  Administered 2015-12-19: 1 via ORAL

## 2015-12-19 MED ORDER — CIPROFLOXACIN HCL 250 MG PO TABS: 500.0000 mg | ORAL_TABLET | Freq: Two times a day (BID) | ORAL | Status: DC

## 2015-12-19 MED ORDER — HYDROCODONE-ACETAMINOPHEN 5-325 MG PO TABS: 1.0000 | ORAL_TABLET | Freq: Four times a day (QID) | ORAL | Status: DC | PRN

## 2015-12-19 MED ORDER — CIPROFLOXACIN HCL 250 MG PO TABS
ORAL_TABLET | ORAL | Status: AC
  Filled 2015-12-19: qty 2

## 2015-12-19 MED ORDER — CIPROFLOXACIN HCL 250 MG PO TABS
500.0000 mg | ORAL_TABLET | Freq: Once | ORAL | Status: AC
  Administered 2015-12-19: 500 mg via ORAL

## 2015-12-19 MED ORDER — CEPHALEXIN 500 MG PO CAPS: 500.0000 mg | ORAL_CAPSULE | Freq: Four times a day (QID) | ORAL | Status: DC

## 2015-12-19 MED ORDER — HYDROCODONE-ACETAMINOPHEN 5-325 MG PO TABS
ORAL_TABLET | ORAL | Status: DC
Start: 2015-12-19 — End: 2015-12-19
  Filled 2015-12-19: qty 1

## 2015-12-19 NOTE — ED Notes (Signed)
Resting quietly  With eyes shut.

## 2015-12-19 NOTE — ED Provider Notes (Signed)
CSN: [id number]     Arrival date & time 12/19/15  1353 History   First MD Initiated Contact with Patient 12/19/15 1422     Chief Complaint  Patient presents with  . Abdominal Pain     (Consider location/radiation/quality/duration/timing/severity/associated sxs/prior Treatment) Patient is a 70 y.o. female presenting with fall. The history is provided by the patient (Patient states that her left knee gave out and she fell to the ground she did not hit her head she only complains of pain in that left leg).  Fall This is a new problem. The current episode started 3 to 5 hours ago. The problem occurs constantly. The problem has not changed since onset.Pertinent negatives include no chest pain, no abdominal pain and no headaches. Exacerbated by: Movement of left leg. Nothing relieves the symptoms.    Past Medical History  Diagnosis Date  . Hypertension   . Type II diabetes mellitus ([place])   . TIA (transient ischemic attack) 1978  . Sickle cell trait ([place])   . Migraines     "weekly" (01/06/2014)  . Arthritis     "all over"  . CKD (chronic kidney disease), stage III   . CAD (coronary artery disease)     a. NSTEMI 12/2013 - occluded dominant RCA with L-R collaterals, moderate prox segmental LAD disease, for medical therapy initially, EF 60% with subtle inferobasal hypokinesia. Consider PCI for refractory CP.   Past Surgical History  Procedure Laterality Date  . Replacement total knee Right 05-03-06  . Tonsillectomy  1972  . Esophagogastroduodenoscopy  11/08/2007     Normal esophagus without evidence of Barrett, mass, erosion/ Normal stomach, duodenal bulb  . Gastric motility study  11/13/2007    mildly delayed emptying subjectively, but normal  analysis-77% of tracer emptied at 2 hours  . Laparoscopic appendectomy N/A 09/15/2012    Procedure: APPENDECTOMY LAPAROSCOPIC;  Surgeon: [name], MD;  Location: [facility] ORS;  Service: General;  Laterality: N/A;  . Joint replacement    .  Appendectomy  March 2014    had appendix frozen  . Cataract extraction w/ intraocular lens  implant, bilateral Bilateral 04/2013-05/2013  . Cardiac catheterization  "years ago" & 01/05/2014  . Tee without cardioversion N/A 03/17/2014    Procedure: TRANSESOPHAGEAL ECHOCARDIOGRAM (TEE);  Surgeon: [name], MD;  Location: [facility] ENDOSCOPY;  Service: Cardiovascular;  Laterality: N/A;  . Cardioversion N/A 03/17/2014    Procedure: CARDIOVERSION;  Surgeon: [name], MD;  Location: [facility] ENDOSCOPY;  Service: Cardiovascular;  Laterality: N/A;  . Left heart catheterization with coronary angiogram N/A 01/05/2014    Procedure: LEFT HEART CATHETERIZATION WITH CORONARY ANGIOGRAM;  Surgeon: [name], MD;  Location: [facility] CATH LAB;  Service: Cardiovascular;  Laterality: N/A;   Family History  Problem Relation Age of Onset  . Hyperlipidemia Mother   . Hypertension Mother   . Heart attack Mother   . Cancer Father     lung  . Hypertension Sister   . Diabetes Brother   . Heart disease Brother   . Hyperlipidemia Brother   . Hypertension Brother   . Heart attack Brother   . Other Brother     DVT   Social History  Substance Use Topics  . Smoking status: Never Smoker   . Smokeless tobacco: Never Used  . Alcohol Use: No   OB History    Gravida Para Term Preterm AB TAB SAB Ectopic Multiple Living   2 2 2        2  Review of Systems  Constitutional: Negative for appetite change and fatigue.  HENT: Negative for congestion, ear discharge and sinus pressure.   Eyes: Negative for discharge.  Respiratory: Negative for cough.   Cardiovascular: Negative for chest pain.  Gastrointestinal: Negative for abdominal pain and diarrhea.  Genitourinary: Negative for frequency and hematuria.  Musculoskeletal: Negative for back pain.       Pain in left leg  Skin: Negative for rash.  Neurological: Negative for seizures and headaches.  Psychiatric/Behavioral: Negative for hallucinations.       Allergies  Penicillins  Home Medications   Prior to Admission medications   Medication Sig Start Date End Date Taking? Authorizing Provider  amitriptyline (ELAVIL) 25 MG tablet Take 25 mg by mouth at bedtime.  10/17/12  Yes Rolland Porter, MD  aspirin 81 MG chewable tablet Chew 81 mg by mouth daily.   Yes Historical Provider, MD  atenolol (TENORMIN) 25 MG tablet Take 0.5 tablets (12.5 mg total) by mouth daily. Patient taking differently: Take 25 mg by mouth daily.  05/17/15  Yes [name], MD  atorvastatin (LIPITOR) 80 MG tablet Take 1 tablet (80 mg total) by mouth every evening. <PLEASE MAKE APPOINTMENT FOR REFILLS> 10/07/15  Yes [name], MD  Cholecalciferol (VITAMIN D-3) 1000 UNITS CAPS Take 1,000 Units by mouth every morning.    Yes Historical Provider, MD  clopidogrel (PLAVIX) 75 MG tablet Take 1 tablet (75 mg total) by mouth daily. 01/06/14  Yes Dayna N Dunn, PA-C  colesevelam (WELCHOL) 625 MG tablet Take 1,875 mg by mouth 2 (two) times daily with a meal. *May take 6 tablets once a day with meals*   Yes Historical Provider, MD  furosemide (LASIX) 40 MG tablet Take 1 tablet (40 mg total) by mouth every morning. 01/07/14  Yes Dayna N Dunn, PA-C  glimepiride (AMARYL) 2 MG tablet Take 2 mg by mouth daily with breakfast.   Yes Historical Provider, MD  insulin aspart (NOVOLOG) 100 UNIT/ML injection Inject 1-20 Units into the skin 3 (three) times daily.    Yes Historical Provider, MD  insulin glargine (LANTUS) 100 UNIT/ML injection Inject 27 Units into the skin at bedtime.   Yes Historical Provider, MD  lisinopril (PRINIVIL,ZESTRIL) 20 MG tablet Take 20 mg by mouth daily.   Yes Historical Provider, MD  chlorthalidone (HYGROTON) 25 MG tablet Take 1 tablet (25 mg total) by mouth daily. <PLEASE MAKE APPOINTMENT FOR REFILLS> Patient not taking: Reported on 12/19/2015 10/28/15   [name], MD  ciprofloxacin (CIPRO) 250 MG tablet Take 2 tablets (500 mg total) by mouth 2 (two) times  daily. One po bid x 7 days 12/19/15   Milton Ferguson, MD  clotrimazole-betamethasone (LOTRISONE) cream Apply 1 application topically 3 (three) times daily.  01/13/14   Historical Provider, MD  diazepam (VALIUM) 2 MG tablet Take 1 tablet (2 mg total) by mouth 2 (two) times daily. Patient not taking: Reported on 12/03/2015 09/24/15   Ezequiel Essex, MD  ELIQUIS 5 MG TABS tablet TAKE (1) TABLET BY MOUTH TWICE DAILY. Patient not taking: Reported on 12/19/2015 07/23/15   [name], MD  HYDROcodone-acetaminophen (NORCO/VICODIN) 5-325 MG tablet Take 1 tablet by mouth every 6 (six) hours as needed. 12/19/15   Milton Ferguson, MD  lisinopril (PRINIVIL,ZESTRIL) 40 MG tablet Take 1 tablet (40 mg total) by mouth daily. Patient not taking: Reported on 12/19/2015 05/24/15   [name], MD  meclizine (ANTIVERT) 12.5 MG tablet Take 1 tablet (12.5 mg total) by mouth  3 (three) times daily as needed for dizziness. Patient not taking: Reported on 12/19/2015 09/24/15   Ezequiel Essex, MD  nitroGLYCERIN (NITROSTAT) 0.4 MG SL tablet Place 1 tablet (0.4 mg total) under the tongue every 5 (five) minutes as needed for chest pain (up to 3 doses). 01/06/14   Dayna N Dunn, PA-C  tiZANidine (ZANAFLEX) 4 MG tablet Take 4 mg by mouth daily. 12/09/15   Historical Provider, MD   BP 136/54 mmHg  Pulse 47  Temp(Src) 98 F (36.7 C) (Oral)  Resp 17  Ht 5\' 6"  (1.676 m)  Wt 209 lb (94.802 kg)  BMI 33.75 kg/m2  SpO2 96% Physical Exam  Constitutional: She is oriented to person, place, and time. She appears well-developed.  HENT:  Head: Normocephalic.  Eyes: Conjunctivae and EOM are normal. No scleral icterus.  Neck: Neck supple. No thyromegaly present.  Cardiovascular: Normal rate and regular rhythm.  Exam reveals no gallop and no friction rub.   No murmur heard. Pulmonary/Chest: No stridor. She has no wheezes. She has no rales. She exhibits no tenderness.  Abdominal: She exhibits no distension. There is no tenderness. There is  no rebound.  Musculoskeletal: Normal range of motion. She exhibits edema.  Tenderness to left knee pain with movement mild swelling  Lymphadenopathy:    She has no cervical adenopathy.  Neurological: She is oriented to person, place, and time. She exhibits normal muscle tone. Coordination normal.  Skin: No rash noted. No erythema.  Psychiatric: She has a normal mood and affect. Her behavior is normal.    ED Course  Procedures (including critical care time) Labs Review Labs Reviewed  COMPREHENSIVE METABOLIC PANEL - Abnormal; Notable for the following:    Glucose, Bld 269 (*)    BUN 26 (*)    Creatinine, Ser 1.35 (*)    GFR calc non Af Amer 38 (*)    GFR calc Af Amer 45 (*)    Anion gap 4 (*)    All other components within normal limits  URINALYSIS, ROUTINE W REFLEX MICROSCOPIC (NOT AT [facility]) - Abnormal; Notable for the following:    Specific Gravity, Urine <1.005 (*)    Glucose, UA 250 (*)    Hgb urine dipstick TRACE (*)    Nitrite POSITIVE (*)    Leukocytes, UA LARGE (*)    All other components within normal limits  URINE MICROSCOPIC-ADD ON - Abnormal; Notable for the following:    Squamous Epithelial / LPF 0-5 (*)    Bacteria, UA MANY (*)    All other components within normal limits  CBC    Imaging Review Ct Head Wo Contrast  12/19/2015  CLINICAL DATA:  Fall, head pain, abdominal pain EXAM: CT HEAD WITHOUT CONTRAST TECHNIQUE: Contiguous axial images were obtained from the base of the skull through the vertex without intravenous contrast. COMPARISON:  Brain MRI 09/24/2015 FINDINGS: Brain: No intracranial hemorrhage, mass effect or midline shift. No acute cortical infarction. No mass lesion is noted on this unenhanced scan. Mild cerebral atrophy. Vascular: No hyperdense vessel or unexpected calcification. Skull: Negative for fracture or focal lesion. Sinuses/Orbits: There is mucosal thickening with partial opacification right maxillary sinus. The mastoid air cells are  unremarkable. Other: None IMPRESSION: No acute intracranial abnormality. Mucosal thickening with partial opacification right maxillary sinus. No skull fracture is noted. Mild cerebral atrophy. Electronically Signed   By: Lahoma Crocker M.D.   On: 12/19/2015 15:52   Dg Knee Complete 4 Views Left  12/19/2015  CLINICAL DATA:  Left  knee pain, fall today EXAM: LEFT KNEE - COMPLETE 4+ VIEW COMPARISON:  None. FINDINGS: Four views of the left knee submitted. No acute fracture or subluxation. There is significant narrowing of lateral joint compartment. Sclerotic changes are noted lateral tibial plateau. There is spurring of lateral femoral condyle and lateral tibial plateau. Mild spurring of medial femoral condyle. Significant narrowing of patellofemoral joint space. Spurring of patella. Moderate joint effusion. IMPRESSION: No acute fracture or subluxation. Extensive osteoarthritic changes as described above. Moderate joint effusion. Electronically Signed   By: Lahoma Crocker M.D.   On: 12/19/2015 15:50   I have personally reviewed and evaluated these images and lab results as part of my medical decision-making.   EKG Interpretation None      MDM   Final diagnoses:  Primary osteoarthritis of left knee    Patient with severe osteoarthritis to left knee and also UTI she will be given Cipro and Vicodin and follow-up with PCP    Milton Ferguson, MD 12/19/15 1724

## 2015-12-19 NOTE — ED Notes (Signed)
EMS transported pt.  Pt left leg gave way and pt fell. Denied any injury from fall.  Pt laid  in the floor supine for  approximately  2 hours.  House warm.   Pt c/o abdominal pain and head pain after fall.  cbg 194.

## 2015-12-19 NOTE — Discharge Instructions (Signed)
Follow up with your md this week. °

## 2015-12-21 LAB — URINE CULTURE: SPECIAL REQUESTS: NORMAL

## 2015-12-22 ENCOUNTER — Telehealth (HOSPITAL_BASED_OUTPATIENT_CLINIC_OR_DEPARTMENT_OTHER): Payer: Self-pay | Admitting: Emergency Medicine

## 2015-12-22 NOTE — Telephone Encounter (Signed)
Post ED Visit - Positive Culture Follow-up  Culture report reviewed by antimicrobial stewardship pharmacist:  []  Elenor Quinones, Pharm.D. []  Heide Guile, Pharm.D., BCPS []  Parks Neptune, Pharm.D. []  Alycia Rossetti, Pharm.D., BCPS []  Friendly, Pharm.D., BCPS, AAHIVP []  Legrand Como, Pharm.D., BCPS, AAHIVP [x]  Milus Glazier, Pharm.D. []  Stephens November, Pharm.D.  Positive urine culture Treated with cephalexin, ciprofloxacin, organism sensitive to the same and no further patient follow-up is required at this time.  Hazle Nordmann 12/22/2015, 11:28 AM

## 2015-12-27 ENCOUNTER — Other Ambulatory Visit: Payer: Self-pay | Admitting: Internal Medicine

## 2016-01-04 DIAGNOSIS — Z1389 Encounter for screening for other disorder: Secondary | ICD-10-CM | POA: Diagnosis not present

## 2016-01-04 DIAGNOSIS — Z0000 Encounter for general adult medical examination without abnormal findings: Secondary | ICD-10-CM | POA: Diagnosis not present

## 2016-01-04 DIAGNOSIS — Z23 Encounter for immunization: Secondary | ICD-10-CM | POA: Diagnosis not present

## 2016-01-14 ENCOUNTER — Other Ambulatory Visit: Payer: Self-pay | Admitting: Internal Medicine

## 2016-01-17 DIAGNOSIS — I10 Essential (primary) hypertension: Secondary | ICD-10-CM | POA: Diagnosis not present

## 2016-01-17 DIAGNOSIS — I2510 Atherosclerotic heart disease of native coronary artery without angina pectoris: Secondary | ICD-10-CM | POA: Diagnosis not present

## 2016-01-17 DIAGNOSIS — K140 Glossitis: Secondary | ICD-10-CM | POA: Diagnosis not present

## 2016-01-17 DIAGNOSIS — E1140 Type 2 diabetes mellitus with diabetic neuropathy, unspecified: Secondary | ICD-10-CM | POA: Diagnosis not present

## 2016-01-17 DIAGNOSIS — R201 Hypoesthesia of skin: Secondary | ICD-10-CM | POA: Diagnosis not present

## 2016-01-17 DIAGNOSIS — Z1389 Encounter for screening for other disorder: Secondary | ICD-10-CM | POA: Diagnosis not present

## 2016-01-27 ENCOUNTER — Other Ambulatory Visit: Payer: Self-pay | Admitting: Internal Medicine

## 2016-01-28 NOTE — Telephone Encounter (Signed)
Rx(s) sent to pharmacy electronically.  

## 2016-02-16 DIAGNOSIS — E559 Vitamin D deficiency, unspecified: Secondary | ICD-10-CM | POA: Diagnosis not present

## 2016-02-16 DIAGNOSIS — N183 Chronic kidney disease, stage 3 (moderate): Secondary | ICD-10-CM | POA: Diagnosis not present

## 2016-02-16 DIAGNOSIS — D509 Iron deficiency anemia, unspecified: Secondary | ICD-10-CM | POA: Diagnosis not present

## 2016-02-16 DIAGNOSIS — D649 Anemia, unspecified: Secondary | ICD-10-CM | POA: Diagnosis not present

## 2016-02-16 DIAGNOSIS — R809 Proteinuria, unspecified: Secondary | ICD-10-CM | POA: Diagnosis not present

## 2016-02-18 ENCOUNTER — Encounter (INDEPENDENT_AMBULATORY_CARE_PROVIDER_SITE_OTHER): Payer: Self-pay

## 2016-02-18 ENCOUNTER — Encounter: Payer: Self-pay | Admitting: Internal Medicine

## 2016-02-18 ENCOUNTER — Ambulatory Visit (INDEPENDENT_AMBULATORY_CARE_PROVIDER_SITE_OTHER): Payer: Medicare Other | Admitting: Internal Medicine

## 2016-02-18 VITALS — BP 130/67 | HR 54 | Ht 66.0 in | Wt 210.6 lb

## 2016-02-18 DIAGNOSIS — R61 Generalized hyperhidrosis: Secondary | ICD-10-CM | POA: Insufficient documentation

## 2016-02-18 DIAGNOSIS — E162 Hypoglycemia, unspecified: Secondary | ICD-10-CM | POA: Insufficient documentation

## 2016-02-18 DIAGNOSIS — Z794 Long term (current) use of insulin: Secondary | ICD-10-CM

## 2016-02-18 DIAGNOSIS — E1140 Type 2 diabetes mellitus with diabetic neuropathy, unspecified: Secondary | ICD-10-CM

## 2016-02-18 DIAGNOSIS — I480 Paroxysmal atrial fibrillation: Secondary | ICD-10-CM

## 2016-02-18 DIAGNOSIS — I10 Essential (primary) hypertension: Secondary | ICD-10-CM | POA: Diagnosis not present

## 2016-02-18 DIAGNOSIS — IMO0001 Reserved for inherently not codable concepts without codable children: Secondary | ICD-10-CM

## 2016-02-18 MED ORDER — CHLORTHALIDONE 25 MG PO TABS: 25.0000 mg | ORAL_TABLET | Freq: Every day | ORAL | 4 refills | Status: DC

## 2016-02-18 MED ORDER — ATENOLOL 25 MG PO TABS: 12.5000 mg | ORAL_TABLET | Freq: Every day | ORAL | 4 refills | Status: DC

## 2016-02-18 NOTE — Progress Notes (Signed)
Patient ID: Cassandra Adams, female   DOB: 11-29-1945, 70 y.o.   MRN: [id number]    02/18/2016 Cassandra Adams   08/19/1945  [id number]  Primary Physicia Cassandra Adams., MD Primary Cardiologist: Dr. Debara Pickett  CC: Diaphoresis   HPI:  The patient is a 70 year old African American female with a history of diabetes, hypertension, prior stroke but no prior history of CAD, who initially presented to [facility] on 01/03/2014 with a complaint of chest pain. Initial troponin was elevated at 15.7 and EKG was with nonspecific ST-T changes. She was placed on IV heparin and IV nitroglycerin and was transferred to [facility] for further treatment. Her chest pain resolved. She underwent a diagnostic left heart catheterization, perform by Dr. Gwenlyn Found which demonstrated occlusion of the midportion of the RCA, which was felt to be the infarct-related artery. However, she had grade 3 left right collaterals. She also has moderate LAD artery disease with 50% segmental stenosis in the proximal portion with a focal area of 60-70% stenosis. The left main and left circumflex were both normal. Left ventricular systolic function was normal with an estimated ejection fraction of 60%. Wall motion abnormalities were notable for sublte inferior basal hypokinesia. Given her lack of ongoing symptoms and preserved LV function with excellent collaterals, it was recommended to treat with medical therapy. It was felt that if she were to develop recurrent chest pain then she would be a candidate for PCI and stenting of her RCA. She left the Cath Lab in stable condition and had no post-cath complications and no recurrent chest pain. She was placed on dual antiplatelet therapy with aspirin plus Plavix. She was also placed on a beta blocker, an ACE inhibitor and statin. Also notable this admission, was a hemoglobin A1c level of 8.1. She was instructed to followup with her PCP for better management of her diabetes.  She  was discharged home on 01/06/2014.  She presents to clinic today for post hospital followup. She is accompanied by her daughter. She denies any recurrent anginal symptoms. She also denies dyspnea, syncope/near-syncope. Yesterday however, she did note feeling a sensation of increased warmth and mild dizziness, that was self-limiting. This lasted only a minute or 2. Again, she had no syncope/near-syncope. She denies any further recurrence. She has been fully compliant with her medications.  Cassandra Adams is seen in followup today. Her EKG demonstrates a sinus rhythm today. However I suspect that she continues to have PAF. Unfortunate she took herself off of Xarelto after experiencing some oral bleeding. She did not notify the office and has not been on any anticoagulation for several months. We discussed her higher than normal risk of stroke, based on a CHADSVASC score of 5. She reports only one episode of angina in January, however it was very short-lived and sharp pain. She took a nitroglycerin and the symptoms went away after 10 or 15 minutes, suggesting this may not eventually been angina.  I saw Cassandra Adams today back in the office. Overall she is feeling well. She denies any chest pain or shortness of breath. She's not had any further episodes of A. fib. She is noted to be mildly bradycardic today. Blood pressure has run somewhat high for a while.  02/18/2016  Cassandra Adams returns today for follow-up. She reports she's been having some episodes of what sounds like night sweats. She wakes up at night fairly soaked. These don't happen every night and is been no associated weight loss, change in appetite or other  constitutional symptoms. She reports she is long past her menopause symptoms. She continues to have morning hypoglycemia with blood sugars in the 40s. I previously asked to decrease her Lantus insulin. She says that her endocrinologist is aware of this and she has an appointment with him in  September. She is not checking her sugars in the middle the night but I'm suspicious of hypoglycemia. Other possible causes would include autonomic dysfunction, she does have gastroparesis and peripheral neuropathy. In addition she could be having other symptoms such as reflux or other things which cause vagal stimulation and diaphoresis. This is very atypical for coronary disease and there is no associated chest pain or worsening shortness of breath.  Current Outpatient Prescriptions  Medication Sig Dispense Refill  . amitriptyline (ELAVIL) 25 MG tablet Take 25 mg by mouth at bedtime.     [name] aspirin 81 MG chewable tablet Chew 81 mg by mouth daily.    [name] atenolol (TENORMIN) 25 MG tablet Take 0.5 tablets (12.5 mg total) by mouth daily. PLEASE CONTACT OFFICE FOR ADDITIONAL REFILLS 2ND ATTEMPT 15 tablet 0  . atorvastatin (LIPITOR) 80 MG tablet Take 1 tablet (80 mg total) by mouth every evening. <PLEASE MAKE APPOINTMENT FOR REFILLS> 30 tablet 0  . chlorthalidone (HYGROTON) 25 MG tablet Take 1 tablet (25 mg total) by mouth daily. <PLEASE MAKE APPOINTMENT FOR REFILLS> 30 tablet 0  . chlorthalidone (HYGROTON) 25 MG tablet TAKE 1 TABLET BY MOUTH ONCE A DAY. 30 tablet 1  . Cholecalciferol (VITAMIN D-3) 1000 UNITS CAPS Take 1,000 Units by mouth every morning.     . ciprofloxacin (CIPRO) 250 MG tablet Take 2 tablets (500 mg total) by mouth 2 (two) times daily. One po bid x 7 days 14 tablet 0  . clopidogrel (PLAVIX) 75 MG tablet Take 1 tablet (75 mg total) by mouth daily. 30 tablet 11  . clotrimazole-betamethasone (LOTRISONE) cream Apply 1 application topically 3 (three) times daily.     . colesevelam (WELCHOL) 625 MG tablet Take 1,875 mg by mouth 2 (two) times daily with a meal. *May take 6 tablets once a day with meals*    . diazepam (VALIUM) 2 MG tablet Take 1 tablet (2 mg total) by mouth 2 (two) times daily. 5 tablet 0  . ELIQUIS 5 MG TABS tablet TAKE (1) TABLET BY MOUTH TWICE DAILY. 60 tablet 5  . furosemide  (LASIX) 40 MG tablet Take 1 tablet (40 mg total) by mouth every morning.    [name] glimepiride (AMARYL) 2 MG tablet Take 2 mg by mouth daily with breakfast.    . HYDROcodone-acetaminophen (NORCO/VICODIN) 5-325 MG tablet Take 1 tablet by mouth every 6 (six) hours as needed. 20 tablet 0  . insulin aspart (NOVOLOG) 100 UNIT/ML injection Inject 1-20 Units into the skin 3 (three) times daily.     . insulin glargine (LANTUS) 100 UNIT/ML injection Inject 27 Units into the skin at bedtime.    [name] lisinopril (PRINIVIL,ZESTRIL) 20 MG tablet Take 20 mg by mouth daily.    [name] lisinopril (PRINIVIL,ZESTRIL) 40 MG tablet Take 1 tablet (40 mg total) by mouth daily. 90 tablet 3  . meclizine (ANTIVERT) 12.5 MG tablet Take 1 tablet (12.5 mg total) by mouth 3 (three) times daily as needed for dizziness. 30 tablet 0  . nitroGLYCERIN (NITROSTAT) 0.4 MG SL tablet Place 1 tablet (0.4 mg total) under the tongue every 5 (five) minutes as needed for chest pain (up to 3 doses). 25 tablet 3  . tiZANidine (ZANAFLEX) 4 MG  tablet Take 4 mg by mouth daily.     No current facility-administered medications for this visit.     Allergies  Allergen Reactions  . Penicillins Hives    Has patient had a PCN reaction causing immediate rash, facial/tongue/throat swelling, SOB or lightheadedness with hypotension: no Has patient had a PCN reaction causing severe rash involving mucus membranes or skin necrosis: No  Has patient had a PCN reaction that required hospitalization: no Has patient had a PCN reaction occurring within the last 10 years: no If all of the above answers are "NO", then may proceed with Cephalosporin use.     Social History   Social History  . Marital status: Legally Separated    Spouse name: N/A  . Number of children: N/A  . Years of education: N/A   Occupational History  . Not on file.   Social History Main Topics  . Smoking status: Never Smoker  . Smokeless tobacco: Never Used  . Alcohol use No  . Drug use:  No  . Sexual activity: Not Currently    Birth control/ protection: Post-menopausal   Other Topics Concern  . Not on file   Social History Narrative  . No narrative on file     Review of Systems: General: negative for chills, fever, night sweats or weight changes.  Cardiovascular: negative for chest pain, dyspnea on exertion, edema, orthopnea, palpitations, paroxysmal nocturnal dyspnea or shortness of breath Dermatological: negative for rash Respiratory: negative for cough or wheezing Urologic: negative for hematuria Abdominal: negative for nausea, vomiting, diarrhea, bright red blood per rectum, melena, or hematemesis Neurologic: negative for visual changes, syncope, or dizziness All other systems reviewed and are otherwise negative except as noted above.    Blood pressure 130/67, pulse (!) 54, height 5\' 6"  (1.676 m), weight 210 lb 9.6 oz (95.5 kg).  General appearance: alert and no distress Neck: no carotid bruit, no JVD and thyroid not enlarged, symmetric, no tenderness/mass/nodules Lungs: clear to auscultation bilaterally Heart: regular rate and rhythm, S1, S2 normal, no murmur, click, rub or gallop Abdomen: soft, non-tender; bowel sounds normal; no masses,  no organomegaly Extremities: extremities normal, atraumatic, no cyanosis or edema Pulses: 2+ and symmetric Skin: Skin color, texture, turgor normal. No rashes or lesions Neurologic: Grossly normal Psych: Pleasant  EKG Deferred  PROBLEM LIST: Patient Active Problem List   Diagnosis Date Noted  . Bradycardia 04/22/2015  . CAD (coronary artery disease) 02/26/2014  . Atrial fibrillation ([place]) 02/26/2014  . NSTEMI (non-ST elevated myocardial infarction) ([place]) 01/03/2014  . Occlusion and stenosis of carotid artery without mention of cerebral infarction 11/21/2013  . Pain in limb-Left neck 11/21/2013  . Carotid stenosis, bilateral 11/17/2011  . HYPERTHYROIDISM, SUBCLINICAL 02/15/2010  . GERD 02/15/2010  . DYSPHAGIA  UNSPECIFIED 02/15/2010  . ABDOMINAL PAIN, UNSPECIFIED SITE 02/15/2010  . DIABETES MELLITUS, TYPE II 02/11/2010  . Essential hypertension 02/11/2010  . HEADACHE 02/11/2010    ASSESSMENT AND PLAN:  Mrs. Gray Reports that she's been having night sweats over the past several months and some worsening fatigue. She denies any chest pain or shortness of breath with exertion. None of the symptoms sound anginal. Differential diagnosis includes possibly hypoglycemia as she is known to have a.m. blood sugars in the 40s. This is been a persistent problem and her Lantus insulin has been decreased. She does take NovoLog 3 times a day and I advised her today to hold her p.m. dose of NovoLog to see if it improves her early morning blood sugars.  Other possibilities include autonomic dysfunction as she has known neuropathy and gastroparesis. There may be benefit from gabapentin. Hormonal changes could also cause this including late menopausal symptoms. It seems very unlikely to be coronary this point. Should these interventions fail to improve her symptoms, we could consider repeat stress testing however I think ischemia is less likely the cause.  Follow-up with me in one month.  [name], MD, [facility] Attending Cardiologist [facility] 02/18/2016 8:23 AM

## 2016-02-18 NOTE — Patient Instructions (Signed)
Your physician has recommended you make the following change in your medication: STOP evening meal dose of Novolog  Your physician recommends that you schedule a follow-up appointment in: [place] with Dr. Debara Pickett

## 2016-02-22 ENCOUNTER — Encounter: Payer: Self-pay | Admitting: "Endocrinology

## 2016-02-23 DIAGNOSIS — I10 Essential (primary) hypertension: Secondary | ICD-10-CM | POA: Diagnosis not present

## 2016-02-23 DIAGNOSIS — N183 Chronic kidney disease, stage 3 (moderate): Secondary | ICD-10-CM | POA: Diagnosis not present

## 2016-02-23 DIAGNOSIS — R809 Proteinuria, unspecified: Secondary | ICD-10-CM | POA: Diagnosis not present

## 2016-02-23 DIAGNOSIS — E1129 Type 2 diabetes mellitus with other diabetic kidney complication: Secondary | ICD-10-CM | POA: Diagnosis not present

## 2016-03-08 ENCOUNTER — Ambulatory Visit: Payer: Self-pay | Admitting: "Endocrinology

## 2016-03-22 ENCOUNTER — Emergency Department (HOSPITAL_COMMUNITY): Payer: Medicare Other

## 2016-03-22 ENCOUNTER — Emergency Department (HOSPITAL_COMMUNITY)
Admission: EM | Admit: 2016-03-22 | Discharge: 2016-03-22 | Disposition: A | Discharge status: Home / Self Care | Payer: Medicare Other | Attending: Emergency Medicine | Admitting: Emergency Medicine

## 2016-03-22 ENCOUNTER — Encounter (HOSPITAL_COMMUNITY): Payer: Self-pay | Admitting: Emergency Medicine

## 2016-03-22 DIAGNOSIS — Z794 Long term (current) use of insulin: Secondary | ICD-10-CM | POA: Diagnosis not present

## 2016-03-22 DIAGNOSIS — Y93E2 Activity, laundry: Secondary | ICD-10-CM | POA: Diagnosis not present

## 2016-03-22 DIAGNOSIS — E162 Hypoglycemia, unspecified: Secondary | ICD-10-CM

## 2016-03-22 DIAGNOSIS — Z7984 Long term (current) use of oral hypoglycemic drugs: Secondary | ICD-10-CM | POA: Diagnosis not present

## 2016-03-22 DIAGNOSIS — N183 Chronic kidney disease, stage 3 (moderate): Secondary | ICD-10-CM | POA: Diagnosis not present

## 2016-03-22 DIAGNOSIS — E11649 Type 2 diabetes mellitus with hypoglycemia without coma: Secondary | ICD-10-CM | POA: Diagnosis not present

## 2016-03-22 DIAGNOSIS — I129 Hypertensive chronic kidney disease with stage 1 through stage 4 chronic kidney disease, or unspecified chronic kidney disease: Secondary | ICD-10-CM | POA: Diagnosis not present

## 2016-03-22 DIAGNOSIS — R51 Headache: Secondary | ICD-10-CM | POA: Diagnosis not present

## 2016-03-22 DIAGNOSIS — I2510 Atherosclerotic heart disease of native coronary artery without angina pectoris: Secondary | ICD-10-CM | POA: Insufficient documentation

## 2016-03-22 DIAGNOSIS — E1122 Type 2 diabetes mellitus with diabetic chronic kidney disease: Secondary | ICD-10-CM | POA: Insufficient documentation

## 2016-03-22 DIAGNOSIS — R404 Transient alteration of awareness: Secondary | ICD-10-CM | POA: Diagnosis not present

## 2016-03-22 DIAGNOSIS — W1800XA Striking against unspecified object with subsequent fall, initial encounter: Secondary | ICD-10-CM | POA: Diagnosis not present

## 2016-03-22 DIAGNOSIS — Y999 Unspecified external cause status: Secondary | ICD-10-CM | POA: Diagnosis not present

## 2016-03-22 DIAGNOSIS — R42 Dizziness and giddiness: Secondary | ICD-10-CM | POA: Insufficient documentation

## 2016-03-22 DIAGNOSIS — R531 Weakness: Secondary | ICD-10-CM | POA: Diagnosis not present

## 2016-03-22 DIAGNOSIS — Y929 Unspecified place or not applicable: Secondary | ICD-10-CM | POA: Diagnosis not present

## 2016-03-22 DIAGNOSIS — Z79899 Other long term (current) drug therapy: Secondary | ICD-10-CM | POA: Insufficient documentation

## 2016-03-22 DIAGNOSIS — S0990XA Unspecified injury of head, initial encounter: Secondary | ICD-10-CM | POA: Diagnosis not present

## 2016-03-22 DIAGNOSIS — Z7982 Long term (current) use of aspirin: Secondary | ICD-10-CM | POA: Diagnosis not present

## 2016-03-22 DIAGNOSIS — R55 Syncope and collapse: Secondary | ICD-10-CM | POA: Diagnosis not present

## 2016-03-22 LAB — BASIC METABOLIC PANEL
ANION GAP: 6 (ref 5–15)
BUN: 29 mg/dL — ABNORMAL HIGH (ref 6–20)
CALCIUM: 10.5 mg/dL — AB (ref 8.9–10.3)
CO2: 28 mmol/L (ref 22–32)
CREATININE: 1.38 mg/dL — AB (ref 0.44–1.00)
Chloride: 109 mmol/L (ref 101–111)
GFR, EST AFRICAN AMERICAN: 43 mL/min — AB (ref >60)
GFR, EST NON AFRICAN AMERICAN: 37 mL/min — AB (ref >60)
Glucose, Bld: 119 mg/dL — ABNORMAL HIGH (ref 65–99)
Potassium: 4.4 mmol/L (ref 3.5–5.1)
SODIUM: 143 mmol/L (ref 135–145)

## 2016-03-22 LAB — CBC WITH DIFFERENTIAL/PLATELET
BASOS ABS: 0 10*3/uL (ref 0.0–0.1)
BASOS PCT: 1 %
EOS ABS: 0.1 10*3/uL (ref 0.0–0.7)
EOS PCT: 1 %
HEMATOCRIT: 34 % — AB (ref 36.0–46.0)
Hemoglobin: 12 g/dL (ref 12.0–15.0)
Lymphocytes Relative: 18 %
Lymphs Abs: 1.1 10*3/uL (ref 0.7–4.0)
MCH: 28.6 pg (ref 26.0–34.0)
MCHC: 35.3 g/dL (ref 30.0–36.0)
MCV: 81.1 fL (ref 78.0–100.0)
MONO ABS: 0.4 10*3/uL (ref 0.1–1.0)
MONOS PCT: 6 %
NEUTROS ABS: 4.8 10*3/uL (ref 1.7–7.7)
Neutrophils Relative %: 74 %
PLATELETS: 168 10*3/uL (ref 150–400)
RBC: 4.19 MIL/uL (ref 3.87–5.11)
RDW: 15.1 % (ref 11.5–15.5)
WBC: 6.4 10*3/uL (ref 4.0–10.5)

## 2016-03-22 LAB — CBG MONITORING, ED
GLUCOSE-CAPILLARY: 131 mg/dL — AB (ref 65–99)
GLUCOSE-CAPILLARY: 105 mg/dL — AB (ref 65–99)
Glucose-Capillary: 165 mg/dL — ABNORMAL HIGH (ref 65–99)

## 2016-03-22 MED ORDER — SODIUM CHLORIDE 0.9 % IV SOLN
INTRAVENOUS | Status: DC
  Administered 2016-03-22: 14:00:00 via INTRAVENOUS

## 2016-03-22 NOTE — ED Triage Notes (Signed)
Pt reports she felt he blood sugar drop and fell approximately 12:15 today, cbg-52. Denies loc. Pt given 1 amp D50 in route by EMS with cbg-213 upon arrival to ED via EMS meter. Pt c/o pain to left forehead from fall. No other complaints. EMS reports pt was too weak to take po on scene. nad noted.

## 2016-03-22 NOTE — ED Notes (Signed)
Pt ate 100% meal tray

## 2016-03-22 NOTE — ED Provider Notes (Signed)
[facility] DEPT Provider Note   CSN: [id number] Arrival date & time: 03/22/16  1302     History   Chief Complaint Chief Complaint  Patient presents with  . Hypoglycemia    HPI Cassandra Adams is a 70 y.o. female.   Hypoglycemia   Patient presented to the emergency room with complaints of low blood sugar. Patient has a history of diabetes and has had intermittent episodes of low blood sugars. Sometimes she will take her insulin because her blood sugars are running low. The patient last took her Lantus last evening. She was doing laundry this morning when she started to feel weak and lightheaded. She fell to the ground but landed on her laundry so she did not get injured. EMS was called and when they evaluated the patient they noted her blood sugar was in the 50s. She was given an amp of D50 with subsequent improvement.  She denies any headache. She denies any speech difficulties. She denies any focal numbness or weakness. No chest pain or shortness of breath. No abdominal pain nausea or vomiting. Past Medical History:  Diagnosis Date  . Arthritis    "all over"  . CAD (coronary artery disease)    a. NSTEMI 12/2013 - occluded dominant RCA with L-R collaterals, moderate prox segmental LAD disease, for medical therapy initially, EF 60% with subtle inferobasal hypokinesia. Consider PCI for refractory CP.  [name] CKD (chronic kidney disease), stage III   . Hypertension   . Migraines    "weekly" (01/06/2014)  . Sickle cell trait ([place])   . TIA (transient ischemic attack) 1978  . Type II diabetes mellitus [facility])     Patient Active Problem List   Diagnosis Date Noted  . Diaphoresis 02/18/2016  . Hypoglycemia 02/18/2016  . Bradycardia 04/22/2015  . CAD (coronary artery disease) 02/26/2014  . Atrial fibrillation ([place]) 02/26/2014  . NSTEMI (non-ST elevated myocardial infarction) ([place]) 01/03/2014  . Occlusion and stenosis of carotid artery without mention of cerebral infarction  11/21/2013  . Pain in limb-Left neck 11/21/2013  . Carotid stenosis, bilateral 11/17/2011  . HYPERTHYROIDISM, SUBCLINICAL 02/15/2010  . GERD 02/15/2010  . DYSPHAGIA UNSPECIFIED 02/15/2010  . ABDOMINAL PAIN, UNSPECIFIED SITE 02/15/2010  . Controlled insulin-dependent diabetes mellitus with neuropathy ([place]) 02/11/2010  . Essential hypertension 02/11/2010  . HEADACHE 02/11/2010    Past Surgical History:  Procedure Laterality Date  . APPENDECTOMY  March 2014   had appendix frozen  . CARDIAC CATHETERIZATION  "years ago" & 01/05/2014  . CARDIOVERSION N/A 03/17/2014   Procedure: CARDIOVERSION;  Surgeon: [name], MD;  Location: [facility] ENDOSCOPY;  Service: Cardiovascular;  Laterality: N/A;  . CATARACT EXTRACTION W/ INTRAOCULAR LENS  IMPLANT, BILATERAL Bilateral 04/2013-05/2013  . ESOPHAGOGASTRODUODENOSCOPY  11/08/2007    Normal esophagus without evidence of Barrett, mass, erosion/ Normal stomach, duodenal bulb  . GASTRIC MOTILITY STUDY  11/13/2007   mildly delayed emptying subjectively, but normal  analysis-77% of tracer emptied at 2 hours  . JOINT REPLACEMENT    . LAPAROSCOPIC APPENDECTOMY N/A 09/15/2012   Procedure: APPENDECTOMY LAPAROSCOPIC;  Surgeon: [name], MD;  Location: [facility] ORS;  Service: General;  Laterality: N/A;  . LEFT HEART CATHETERIZATION WITH CORONARY ANGIOGRAM N/A 01/05/2014   Procedure: LEFT HEART CATHETERIZATION WITH CORONARY ANGIOGRAM;  Surgeon: [name], MD;  Location: [facility] CATH LAB;  Service: Cardiovascular;  Laterality: N/A;  . REPLACEMENT TOTAL KNEE Right 05-03-06  . TEE WITHOUT CARDIOVERSION N/A 03/17/2014   Procedure: TRANSESOPHAGEAL ECHOCARDIOGRAM (TEE);  Surgeon: [name],  MD;  Location: [facility] ENDOSCOPY;  Service: Cardiovascular;  Laterality: N/A;  . TONSILLECTOMY  1972    OB History    Gravida Para Term Preterm AB Living   2 2 2     2    SAB TAB Ectopic Multiple Live Births                   Home Medications    Prior to Admission  medications   Medication Sig Start Date End Date Taking? Authorizing Provider  amitriptyline (ELAVIL) 25 MG tablet Take 25 mg by mouth at bedtime.  10/17/12  Yes Rolland Porter, MD  aspirin 81 MG chewable tablet Chew 81 mg by mouth daily.   Yes Historical Provider, MD  atenolol (TENORMIN) 25 MG tablet Take 0.5 tablets (12.5 mg total) by mouth daily. PLEASE CONTACT OFFICE FOR ADDITIONAL REFILLS 2ND ATTEMPT 02/18/16  Yes [name], MD  atorvastatin (LIPITOR) 80 MG tablet Take 1 tablet (80 mg total) by mouth every evening. <PLEASE MAKE APPOINTMENT FOR REFILLS> 10/07/15  Yes [name], MD  chlorthalidone (HYGROTON) 25 MG tablet Take 1 tablet (25 mg total) by mouth daily. <PLEASE MAKE APPOINTMENT FOR REFILLS> 02/18/16  Yes [name], MD  clopidogrel (PLAVIX) 75 MG tablet Take 1 tablet (75 mg total) by mouth daily. 01/06/14  Yes Dayna N Dunn, PA-C  clotrimazole-betamethasone (LOTRISONE) cream Apply 1 application topically 3 (three) times daily.  01/13/14  Yes Historical Provider, MD  colesevelam (WELCHOL) 625 MG tablet Take 1,875 mg by mouth 2 (two) times daily with a meal. *May take 6 tablets once a day with meals*   Yes Historical Provider, MD  diazepam (VALIUM) 2 MG tablet Take 1 tablet (2 mg total) by mouth 2 (two) times daily. 09/24/15  Yes Ezequiel Essex, MD  ELIQUIS 5 MG TABS tablet TAKE (1) TABLET BY MOUTH TWICE DAILY. 01/14/16  Yes [name], MD  furosemide (LASIX) 40 MG tablet Take 1 tablet (40 mg total) by mouth every morning. 01/07/14  Yes Dayna N Dunn, PA-C  glimepiride (AMARYL) 2 MG tablet Take 6 mg by mouth daily with breakfast.    Yes Historical Provider, MD  insulin glargine (LANTUS) 100 UNIT/ML injection Inject 27 Units into the skin at bedtime.   Yes Historical Provider, MD  lisinopril (PRINIVIL,ZESTRIL) 20 MG tablet Take 20 mg by mouth daily.   Yes Historical Provider, MD  lisinopril (PRINIVIL,ZESTRIL) 40 MG tablet Take 1 tablet (40 mg total) by mouth daily. 05/24/15  Yes  [name], MD  tiZANidine (ZANAFLEX) 4 MG tablet Take 4 mg by mouth daily. 12/09/15  Yes Historical Provider, MD  ciprofloxacin (CIPRO) 250 MG tablet Take 2 tablets (500 mg total) by mouth 2 (two) times daily. One po bid x 7 days Patient not taking: Reported on 03/22/2016 12/19/15   Milton Ferguson, MD  HYDROcodone-acetaminophen (NORCO/VICODIN) 5-325 MG tablet Take 1 tablet by mouth every 6 (six) hours as needed. 12/19/15   Milton Ferguson, MD  meclizine (ANTIVERT) 12.5 MG tablet Take 1 tablet (12.5 mg total) by mouth 3 (three) times daily as needed for dizziness. 09/24/15   Ezequiel Essex, MD  nitroGLYCERIN (NITROSTAT) 0.4 MG SL tablet Place 1 tablet (0.4 mg total) under the tongue every 5 (five) minutes as needed for chest pain (up to 3 doses). 01/06/14   Charlie Pitter, PA-C    Family History Family History  Problem Relation Age of Onset  . Hyperlipidemia Mother   . Hypertension Mother   .  Heart attack Mother   . Cancer Father     lung  . Hypertension Sister   . Diabetes Brother   . Heart disease Brother   . Hyperlipidemia Brother   . Hypertension Brother   . Heart attack Brother   . Other Brother     DVT    Social History Social History  Substance Use Topics  . Smoking status: Never Smoker  . Smokeless tobacco: Never Used  . Alcohol use No     Allergies   Penicillins   Review of Systems Review of Systems  All other systems reviewed and are negative.    Physical Exam Updated Vital Signs BP 148/63   Pulse (!) 56   Temp 98.1 F (36.7 C)   Resp 14   Ht 5\' 6"  (1.676 m)   Wt 95.3 kg   SpO2 99%   BMI 33.89 kg/m   Physical Exam  Constitutional: She appears well-developed and well-nourished. No distress.  HENT:  Head: Normocephalic and atraumatic.  Right Ear: External ear normal.  Left Ear: External ear normal.  Eyes: Conjunctivae are normal. Right eye exhibits no discharge. Left eye exhibits no discharge. No scleral icterus.  Neck: Neck supple. No tracheal  deviation present.  Cardiovascular: Normal rate, regular rhythm and intact distal pulses.   Pulmonary/Chest: Effort normal and breath sounds normal. No stridor. No respiratory distress. She has no wheezes. She has no rales.  Abdominal: Soft. Bowel sounds are normal. She exhibits no distension. There is no tenderness. There is no rebound and no guarding.  Musculoskeletal: She exhibits no edema or tenderness.  Neurological: She is alert. She has normal strength. No cranial nerve deficit (no facial droop, extraocular movements intact, no slurred speech) or sensory deficit. She exhibits normal muscle tone. She displays no seizure activity. Coordination normal.  Skin: Skin is warm and dry. No rash noted.  Psychiatric: She has a normal mood and affect.  Nursing note and vitals reviewed.    ED Treatments / Results  Labs (all labs ordered are listed, but only abnormal results are displayed) Labs Reviewed  CBC WITH DIFFERENTIAL/PLATELET - Abnormal; Notable for the following:       Result Value   HCT 34.0 (*)    All other components within normal limits  BASIC METABOLIC PANEL - Abnormal; Notable for the following:    Glucose, Bld 119 (*)    BUN 29 (*)    Creatinine, Ser 1.38 (*)    Calcium 10.5 (*)    GFR calc non Af Amer 37 (*)    GFR calc Af Amer 43 (*)    All other components within normal limits  CBG MONITORING, ED - Abnormal; Notable for the following:    Glucose-Capillary 165 (*)    All other components within normal limits  CBG MONITORING, ED - Abnormal; Notable for the following:    Glucose-Capillary 131 (*)    All other components within normal limits  CBG MONITORING, ED - Abnormal; Notable for the following:    Glucose-Capillary 105 (*)    All other components within normal limits  CBG MONITORING, ED    EKG  EKG Interpretation  Date/Time:  Wednesday March 22 2016 13:06:33 EDT Ventricular Rate:  67 PR Interval:    QRS Duration: 96 QT Interval:  398 QTC  Calculation: 411 R Axis:   33 Text Interpretation:  Sinus rhythm Multiple ventricular premature complexes Borderline T wave abnormalities pvcs are new compared to prior tracing Confirmed by Shley Dolby  MD-J, Ercie Eliasen (  [id number]) on 03/22/2016 1:21:58 PM       Radiology Ct Head Wo Contrast  Result Date: 03/22/2016 CLINICAL DATA:  Fall today striking forehead.  Pain.  Hypoglycemia. EXAM: CT HEAD WITHOUT CONTRAST TECHNIQUE: Contiguous axial images were obtained from the base of the skull through the vertex without intravenous contrast. COMPARISON:  CT head 12/19/2015. FINDINGS: Brain: There is no evidence of acute intracranial hemorrhage, mass lesion, brain edema or extra-axial fluid collection. The ventricles and subarachnoid spaces are appropriately sized for age. There is no CT evidence of acute cortical infarction. Vascular: No hyperdense vessel or unexpected calcification. Skull: Negative for fracture or focal lesion. Sinuses/Orbits: The visualized paranasal sinuses and mastoid air cells are clear. The right maxillary sinus is not imaged on the current study. No orbital abnormalities are seen. Other: None. IMPRESSION: Stable head CT.  No acute intracranial or calvarial findings. Electronically Signed   By: Richardean Sale M.D.   On: 03/22/2016 13:59    Procedures Procedures (including critical care time)  Medications Ordered in ED Medications  0.9 %  sodium chloride infusion ( Intravenous New Bag/Given 03/22/16 1357)     Initial Impression / Assessment and Plan / ED Course  I have reviewed the triage vital signs and the nursing notes.  Pertinent labs & imaging results that were available during my care of the patient were reviewed by me and considered in my medical decision making (see chart for details).  Clinical Course  Comment By Time  Discussed results with patient.  She is feeling well.  She has not had anything to eat yet.  Meal ordered for her Dorie Rank, MD 09/27 1529   I suspect the  patient's symptoms were related to hypoglycemia. The patient's blood sugars were reviewed. They are trending downward but she has not had recurrent hypoglycemia. I have encouraged her to be something and monitor blood sugar closely. Patient was instructed to follow-up with her doctor to discuss lowering her insulin dose. I will have her hold her insulin doses this evening. Final Clinical Impressions(s) / ED Diagnoses   Final diagnoses:  Hypoglycemia    New Prescriptions New Prescriptions   No medications on file     Dorie Rank, MD 03/22/16 1535

## 2016-03-22 NOTE — Discharge Instructions (Signed)
Follow-up with your doctor later this week to discuss your insulin regimen. Hold your insulin dose this evening. Make sure to eat regularly and to monitor blood sugar closely.

## 2016-03-30 ENCOUNTER — Ambulatory Visit (INDEPENDENT_AMBULATORY_CARE_PROVIDER_SITE_OTHER): Payer: Medicare Other | Admitting: Internal Medicine

## 2016-03-30 ENCOUNTER — Encounter: Payer: Self-pay | Admitting: Internal Medicine

## 2016-03-30 VITALS — BP 144/67 | HR 52 | Ht 66.0 in | Wt 212.2 lb

## 2016-03-30 DIAGNOSIS — Z794 Long term (current) use of insulin: Secondary | ICD-10-CM

## 2016-03-30 DIAGNOSIS — I2583 Coronary atherosclerosis due to lipid rich plaque: Secondary | ICD-10-CM

## 2016-03-30 DIAGNOSIS — E1140 Type 2 diabetes mellitus with diabetic neuropathy, unspecified: Secondary | ICD-10-CM

## 2016-03-30 DIAGNOSIS — I2510 Atherosclerotic heart disease of native coronary artery without angina pectoris: Secondary | ICD-10-CM

## 2016-03-30 DIAGNOSIS — IMO0001 Reserved for inherently not codable concepts without codable children: Secondary | ICD-10-CM

## 2016-03-30 DIAGNOSIS — I480 Paroxysmal atrial fibrillation: Secondary | ICD-10-CM | POA: Diagnosis not present

## 2016-03-30 DIAGNOSIS — R609 Edema, unspecified: Secondary | ICD-10-CM | POA: Diagnosis not present

## 2016-03-30 DIAGNOSIS — I10 Essential (primary) hypertension: Secondary | ICD-10-CM

## 2016-03-30 NOTE — Patient Instructions (Addendum)
Medication Instructions:  STOP Plavix  Labwork: None   Testing/Procedures: None  Follow-Up: Your physician wants you to follow-up in: 6 months with Dr Debara Pickett. You will receive a reminder letter in the mail two months in advance. If you don't receive a letter, please call our office to schedule the follow-up appointment.  Any Other Special Instructions Will Be Listed Below (If Applicable).  Wear stockings during the day and take off at night, Do not wear over night.   If you need a refill on your cardiac medications before your next appointment, please call your pharmacy.

## 2016-03-30 NOTE — Progress Notes (Signed)
Patient ID: Cassandra Adams, female   DOB: June 14, 1946, 70 y.o.   MRN: [id number]    03/30/2016 Cassandra Adams   Jun 20, 1946  [id number]  Primary Physicia Glo Herring., MD Primary Cardiologist: Dr. Debara Pickett  CC: No complaints  HPI:  The patient is a 70 year old African American female with a history of diabetes, hypertension, prior stroke but no prior history of CAD, who initially presented to [facility] on 01/03/2014 with a complaint of chest pain. Initial troponin was elevated at 15.7 and EKG was with nonspecific ST-T changes. She was placed on IV heparin and IV nitroglycerin and was transferred to [facility] for further treatment. Her chest pain resolved. She underwent a diagnostic left heart catheterization, perform by Dr. Gwenlyn Found which demonstrated occlusion of the midportion of the RCA, which was felt to be the infarct-related artery. However, she had grade 3 left right collaterals. She also has moderate LAD artery disease with 50% segmental stenosis in the proximal portion with a focal area of 60-70% stenosis. The left main and left circumflex were both normal. Left ventricular systolic function was normal with an estimated ejection fraction of 60%. Wall motion abnormalities were notable for sublte inferior basal hypokinesia. Given her lack of ongoing symptoms and preserved LV function with excellent collaterals, it was recommended to treat with medical therapy. It was felt that if she were to develop recurrent chest pain then she would be a candidate for PCI and stenting of her RCA. She left the Cath Lab in stable condition and had no post-cath complications and no recurrent chest pain. She was placed on dual antiplatelet therapy with aspirin plus Plavix. She was also placed on a beta blocker, an ACE inhibitor and statin. Also notable this admission, was a hemoglobin A1c level of 8.1. She was instructed to followup with her PCP for better management of her diabetes.  She  was discharged home on 01/06/2014.  She presents to clinic today for post hospital followup. She is accompanied by her daughter. She denies any recurrent anginal symptoms. She also denies dyspnea, syncope/near-syncope. Yesterday however, she did note feeling a sensation of increased warmth and mild dizziness, that was self-limiting. This lasted only a minute or 2. Again, she had no syncope/near-syncope. She denies any further recurrence. She has been fully compliant with her medications.  Cassandra Adams is seen in followup today. Her EKG demonstrates a sinus rhythm today. However I suspect that she continues to have PAF. Unfortunate she took herself off of Xarelto after experiencing some oral bleeding. She did not notify the office and has not been on any anticoagulation for several months. We discussed her higher than normal risk of stroke, based on a CHADSVASC score of 5. She reports only one episode of angina in January, however it was very short-lived and sharp pain. She took a nitroglycerin and the symptoms went away after 10 or 15 minutes, suggesting this may not eventually been angina.  I saw Cassandra Adams today back in the office. Overall she is feeling well. She denies any chest pain or shortness of breath. She's not had any further episodes of A. fib. She is noted to be mildly bradycardic today. Blood pressure has run somewhat high for a while.  02/18/2016  Cassandra Adams returns today for follow-up. She reports she's been having some episodes of what sounds like night sweats. She wakes up at night fairly soaked. These don't happen every night and is been no associated weight loss, change in appetite or other  constitutional symptoms. She reports she is long past her menopause symptoms. She continues to have morning hypoglycemia with blood sugars in the 40s. I previously asked to decrease her Lantus insulin. She says that her endocrinologist is aware of this and she has an appointment with him in  September. She is not checking her sugars in the middle the night but I'm suspicious of hypoglycemia. Other possible causes would include autonomic dysfunction, she does have gastroparesis and peripheral neuropathy. In addition she could be having other symptoms such as reflux or other things which cause vagal stimulation and diaphoresis. This is very atypical for coronary disease and there is no associated chest pain or worsening shortness of breath.  03/30/2016  Cassandra Adams was seen back today for follow-up. She reports her hot flashes have resolved. Unfortunately even though we decreased her insulin, she had some recurrent hypoglycemia and ended up in the emergency department. She is now having problems with hyperglycemia. 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 She denies any chest pain or worsening shortness of breath. She does have some chronic lower extremity edema which is likely from neuropathy. I've recommended bilateral knee-high 20-30 mmHg compression stockings.   Current Outpatient Prescriptions  Medication Sig Dispense Refill  . ACCU-CHEK AVIVA PLUS test strip     . ACCU-CHEK SOFTCLIX LANCETS lancets     . amitriptyline (ELAVIL) 25 MG tablet Take 25 mg by mouth at bedtime.     [name] amLODipine (NORVASC) 10 MG tablet Take 5 mg by mouth 2 (two) times daily.    [name] aspirin 81 MG chewable tablet Chew 81 mg by mouth daily.    [name] atenolol (TENORMIN) 25 MG tablet Take 0.5 tablets (12.5 mg total) by mouth daily. PLEASE CONTACT OFFICE FOR ADDITIONAL REFILLS 2ND ATTEMPT 90 tablet 4  . atorvastatin (LIPITOR) 80 MG tablet Take 1 tablet (80 mg total) by mouth every evening. <PLEASE MAKE APPOINTMENT FOR REFILLS> 30 tablet 0  . clopidogrel (PLAVIX) 75 MG tablet Take 1 tablet (75 mg total) by mouth daily. 30 tablet 11  . clotrimazole-betamethasone (LOTRISONE) cream Apply 1 application topically 3 (three) times daily.     .  colesevelam (WELCHOL) 625 MG tablet Take 1,875 mg by mouth 2 (two) times daily with a meal. *May take 6 tablets once a day with meals*    . diazepam (VALIUM) 2 MG tablet Take 1 tablet (2 mg total) by mouth 2 (two) times daily. 5 tablet 0  . ELIQUIS 5 MG TABS tablet TAKE (1) TABLET BY MOUTH TWICE DAILY. 60 tablet 5  . furosemide (LASIX) 40 MG tablet Take 1 tablet (40 mg total) by mouth every morning.    [name] glimepiride (AMARYL) 2 MG tablet Take 6 mg by mouth daily with breakfast.     . HUMALOG 100 UNIT/ML injection Inject 1-10 Units into the skin 3 (three) times daily with meals.     . insulin glargine (LANTUS) 100 UNIT/ML injection Inject 27 Units into the skin at bedtime.    [name] lisinopril (PRINIVIL,ZESTRIL) 20 MG tablet Take 20 mg by mouth daily.    . meclizine (ANTIVERT) 12.5 MG tablet Take 1 tablet (12.5 mg total) by mouth 3 (three) times daily as needed for dizziness. 30 tablet 0  . nitroGLYCERIN (NITROSTAT) 0.4 MG SL tablet Place 1 tablet (0.4 mg total) under the tongue every 5 (five) minutes as needed for chest pain (up to 3 doses). 25 tablet 3  . tiZANidine (  ZANAFLEX) 4 MG tablet Take 4 mg by mouth daily.     No current facility-administered medications for this visit.     Allergies  Allergen Reactions  . Penicillins Hives    Has patient had a PCN reaction causing immediate rash, facial/tongue/throat swelling, SOB or lightheadedness with hypotension: no Has patient had a PCN reaction causing severe rash involving mucus membranes or skin necrosis: No  Has patient had a PCN reaction that required hospitalization: no Has patient had a PCN reaction occurring within the last 10 years: no If all of the above answers are "NO", then may proceed with Cephalosporin use.     Social History   Social History  . Marital status: Legally Separated    Spouse name: N/A  . Number of children: N/A  . Years of education: N/A   Occupational History  . Not on file.   Social History Main Topics  .  Smoking status: Never Smoker  . Smokeless tobacco: Never Used  . Alcohol use No  . Drug use: No  . Sexual activity: Not Currently    Birth control/ protection: Post-menopausal   Other Topics Concern  . Not on file   Social History Narrative  . No narrative on file     Review of Systems: General: negative for chills, fever, night sweats or weight changes.  Cardiovascular: negative for chest pain, dyspnea on exertion, edema, orthopnea, palpitations, paroxysmal nocturnal dyspnea or shortness of breath Dermatological: negative for rash Respiratory: negative for cough or wheezing Urologic: negative for hematuria Abdominal: negative for nausea, vomiting, diarrhea, bright red blood per rectum, melena, or hematemesis Neurologic: negative for visual changes, syncope, or dizziness All other systems reviewed and are otherwise negative except as noted above.    Blood pressure (!) 144/67, pulse (!) 52, height 5\' 6"  (1.676 m), weight 212 lb 3.2 oz (96.3 kg).  General appearance: alert and no distress Neck: no carotid bruit, no JVD and thyroid not enlarged, symmetric, no tenderness/mass/nodules Lungs: clear to auscultation bilaterally Heart: regular rate and rhythm, S1, S2 normal, no murmur, click, rub or gallop Abdomen: soft, non-tender; bowel sounds normal; no masses,  no organomegaly Extremities: edema trace to 1+ bilateral lower extremity edema Pulses: 2+ and symmetric Skin: Skin color, texture, turgor normal. No rashes or lesions Neurologic: Grossly normal Psych: Pleasant  EKG Deferred  PROBLEM LIST: 1. CAD status post PCI to the RCA in 2015 2. PAF - CHADSVASC score of 5 on Eliquis 3. Hypertension 4. Dyslipidemia 5. IDDM with neuropathy 6. Carotid artery stenosis/occlusion 7. Leg edema  ASSESSMENT AND PLAN:  1. Mrs. Adams reports improvement in her symptoms, suggesting it was related to hypoglycemia. However she is now hyperglycemic. She will need medication adjustments  by her primary care provider. She's also complaining of some lower extremity swelling which is likely related to neuropathy. Ive recommended bilateral knee high compression stockings. She is noted to be on triple therapy with aspirin, Plavix and Eliquis today. Her stent was remote in 2015 and there is no longer ongoing indication for triple therapy. I recommend discontinuing Plavix and continuing aspirin and Eliquis today.  Follow-up with me in 6 months.  [name], MD, [facility] Attending Cardiologist [facility] 03/30/2016 10:06 AM

## 2016-04-03 DIAGNOSIS — E1129 Type 2 diabetes mellitus with other diabetic kidney complication: Secondary | ICD-10-CM | POA: Diagnosis not present

## 2016-04-03 DIAGNOSIS — Z1389 Encounter for screening for other disorder: Secondary | ICD-10-CM | POA: Diagnosis not present

## 2016-04-03 DIAGNOSIS — N183 Chronic kidney disease, stage 3 (moderate): Secondary | ICD-10-CM | POA: Diagnosis not present

## 2016-04-03 DIAGNOSIS — I10 Essential (primary) hypertension: Secondary | ICD-10-CM | POA: Diagnosis not present

## 2016-04-03 DIAGNOSIS — I7389 Other specified peripheral vascular diseases: Secondary | ICD-10-CM | POA: Diagnosis not present

## 2016-05-09 ENCOUNTER — Other Ambulatory Visit: Payer: Self-pay | Admitting: Internal Medicine

## 2016-06-22 DIAGNOSIS — E559 Vitamin D deficiency, unspecified: Secondary | ICD-10-CM | POA: Diagnosis not present

## 2016-06-22 DIAGNOSIS — D509 Iron deficiency anemia, unspecified: Secondary | ICD-10-CM | POA: Diagnosis not present

## 2016-06-22 DIAGNOSIS — R809 Proteinuria, unspecified: Secondary | ICD-10-CM | POA: Diagnosis not present

## 2016-06-22 DIAGNOSIS — Z79899 Other long term (current) drug therapy: Secondary | ICD-10-CM | POA: Diagnosis not present

## 2016-06-22 DIAGNOSIS — I10 Essential (primary) hypertension: Secondary | ICD-10-CM | POA: Diagnosis not present

## 2016-06-28 DIAGNOSIS — E875 Hyperkalemia: Secondary | ICD-10-CM | POA: Diagnosis not present

## 2016-06-28 DIAGNOSIS — R809 Proteinuria, unspecified: Secondary | ICD-10-CM | POA: Diagnosis not present

## 2016-06-28 DIAGNOSIS — N183 Chronic kidney disease, stage 3 (moderate): Secondary | ICD-10-CM | POA: Diagnosis not present

## 2016-07-14 ENCOUNTER — Other Ambulatory Visit: Payer: Self-pay | Admitting: Internal Medicine

## 2016-07-21 ENCOUNTER — Other Ambulatory Visit: Payer: Self-pay | Admitting: Internal Medicine

## 2016-09-01 ENCOUNTER — Other Ambulatory Visit: Payer: Self-pay | Admitting: Internal Medicine

## 2016-10-17 ENCOUNTER — Other Ambulatory Visit: Payer: Self-pay | Admitting: Internal Medicine

## 2016-10-17 DIAGNOSIS — E1140 Type 2 diabetes mellitus with diabetic neuropathy, unspecified: Secondary | ICD-10-CM | POA: Diagnosis not present

## 2016-10-17 DIAGNOSIS — E1151 Type 2 diabetes mellitus with diabetic peripheral angiopathy without gangrene: Secondary | ICD-10-CM | POA: Diagnosis not present

## 2016-10-17 DIAGNOSIS — I10 Essential (primary) hypertension: Secondary | ICD-10-CM | POA: Diagnosis not present

## 2016-10-17 DIAGNOSIS — N183 Chronic kidney disease, stage 3 (moderate): Secondary | ICD-10-CM | POA: Diagnosis not present

## 2016-10-17 DIAGNOSIS — E119 Type 2 diabetes mellitus without complications: Secondary | ICD-10-CM | POA: Diagnosis not present

## 2016-10-17 NOTE — Telephone Encounter (Signed)
NEEDS OV 

## 2016-11-24 ENCOUNTER — Ambulatory Visit: Payer: Medicare Other

## 2016-11-24 ENCOUNTER — Ambulatory Visit (INDEPENDENT_AMBULATORY_CARE_PROVIDER_SITE_OTHER): Payer: Medicare Other | Admitting: Internal Medicine

## 2016-11-24 ENCOUNTER — Encounter: Payer: Self-pay | Admitting: Internal Medicine

## 2016-11-24 VITALS — BP 158/62 | HR 59 | Ht 66.0 in | Wt 202.4 lb

## 2016-11-24 DIAGNOSIS — I2510 Atherosclerotic heart disease of native coronary artery without angina pectoris: Secondary | ICD-10-CM | POA: Diagnosis not present

## 2016-11-24 DIAGNOSIS — I2583 Coronary atherosclerosis due to lipid rich plaque: Secondary | ICD-10-CM | POA: Diagnosis not present

## 2016-11-24 DIAGNOSIS — E162 Hypoglycemia, unspecified: Secondary | ICD-10-CM

## 2016-11-24 DIAGNOSIS — I10 Essential (primary) hypertension: Secondary | ICD-10-CM

## 2016-11-24 DIAGNOSIS — Z79899 Other long term (current) drug therapy: Secondary | ICD-10-CM

## 2016-11-24 DIAGNOSIS — E785 Hyperlipidemia, unspecified: Secondary | ICD-10-CM | POA: Diagnosis not present

## 2016-11-24 MED ORDER — NITROGLYCERIN 0.4 MG SL SUBL: 0.4000 mg | SUBLINGUAL_TABLET | SUBLINGUAL | 3 refills | Status: DC | PRN

## 2016-11-24 MED ORDER — ATENOLOL 25 MG PO TABS: 12.5000 mg | ORAL_TABLET | Freq: Every day | ORAL | 3 refills | Status: DC

## 2016-11-24 MED ORDER — APIXABAN 5 MG PO TABS: 5.0000 mg | ORAL_TABLET | Freq: Two times a day (BID) | ORAL | 3 refills | Status: DC

## 2016-11-24 MED ORDER — ATORVASTATIN CALCIUM 80 MG PO TABS: 80.0000 mg | ORAL_TABLET | Freq: Every evening | ORAL | 3 refills | Status: DC

## 2016-11-24 NOTE — Progress Notes (Signed)
Patient ID: Cassandra Adams, female   DOB: 01/02/1946, 71 y.o.   MRN: [id number]    11/24/2016 Cassandra Adams   06-19-1946  [id number]  Primary Physicia Redmond School, MD Primary Cardiologist: Dr. Debara Pickett  CC: Sweating, one episode of chest pain  HPI:  The patient is a 71 year old African American female with a history of diabetes, hypertension, prior stroke but no prior history of CAD, who initially presented to [facility] on 01/03/2014 with a complaint of chest pain. Initial troponin was elevated at 15.7 and EKG was with nonspecific ST-T changes. She was placed on IV heparin and IV nitroglycerin and was transferred to [facility] for further treatment. Her chest pain resolved. She underwent a diagnostic left heart catheterization, perform by Dr. Gwenlyn Found which demonstrated occlusion of the midportion of the RCA, which was felt to be the infarct-related artery. However, she had grade 3 left right collaterals. She also has moderate LAD artery disease with 50% segmental stenosis in the proximal portion with a focal area of 60-70% stenosis. The left main and left circumflex were both normal. Left ventricular systolic function was normal with an estimated ejection fraction of 60%. Wall motion abnormalities were notable for sublte inferior basal hypokinesia. Given her lack of ongoing symptoms and preserved LV function with excellent collaterals, it was recommended to treat with medical therapy. It was felt that if she were to develop recurrent chest pain then she would be a candidate for PCI and stenting of her RCA. She left the Cath Lab in stable condition and had no post-cath complications and no recurrent chest pain. She was placed on dual antiplatelet therapy with aspirin plus Plavix. She was also placed on a beta blocker, an ACE inhibitor and statin. Also notable this admission, was a hemoglobin A1c level of 8.1. She was instructed to followup with her PCP for better management of  her diabetes.  She was discharged home on 01/06/2014.  She presents to clinic today for post hospital followup. She is accompanied by her daughter. She denies any recurrent anginal symptoms. She also denies dyspnea, syncope/near-syncope. Yesterday however, she did note feeling a sensation of increased warmth and mild dizziness, that was self-limiting. This lasted only a minute or 2. Again, she had no syncope/near-syncope. She denies any further recurrence. She has been fully compliant with her medications.  Cassandra Adams is seen in followup today. Her EKG demonstrates a sinus rhythm today. However I suspect that she continues to have PAF. Unfortunate she took herself off of Xarelto after experiencing some oral bleeding. She did not notify the office and has not been on any anticoagulation for several months. We discussed her higher than normal risk of stroke, based on a CHADSVASC score of 5. She reports only one episode of angina in January, however it was very short-lived and sharp pain. She took a nitroglycerin and the symptoms went away after 10 or 15 minutes, suggesting this may not eventually been angina.  I saw Cassandra Adams today back in the office. Overall she is feeling well. She denies any chest pain or shortness of breath. She's not had any further episodes of A. fib. She is noted to be mildly bradycardic today. Blood pressure has run somewhat high for a while.  02/18/2016  Cassandra Adams returns today for follow-up. She reports she's been having some episodes of what sounds like night sweats. She wakes up at night fairly soaked. These don't happen every night and is been no associated weight loss, change  in appetite or other constitutional symptoms. She reports she is long past her menopause symptoms. She continues to have morning hypoglycemia with blood sugars in the 40s. I previously asked to decrease her Lantus insulin. She says that her endocrinologist is aware of this and she has an  appointment with him in September. She is not checking her sugars in the middle the night but I'm suspicious of hypoglycemia. Other possible causes would include autonomic dysfunction, she does have gastroparesis and peripheral neuropathy. In addition she could be having other symptoms such as reflux or other things which cause vagal stimulation and diaphoresis. This is very atypical for coronary disease and there is no associated chest pain or worsening shortness of breath.  03/30/2016  Cassandra Adams was seen back today for follow-up. She reports her hot flashes have resolved. Unfortunately even though we decreased her insulin, she had some recurrent hypoglycemia and ended up in the emergency department. She is now having problems with hyperglycemia. 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 She denies any chest pain or worsening shortness of breath. She does have some chronic lower extremity edema which is likely from neuropathy. I've recommended bilateral knee-high 20-30 mmHg compression stockings.  11/24/2016  Cassandra Adams returns for follow-up. She reported December she had to take one nitroglycerin. She was very active that day and it seemed to resolve her symptoms fairly quickly. She's not required any more nitroglycerin. She is asking for refills of that today. She continues to have problems with hypoglycemia although does seem she has awareness. Recently she had a few episodes of spontaneous diaphoresis. This was not associated with any chest pain and testing of her blood sugar indicated hypoglycemia so I suspect that this is more hypoglycemia awareness. She reports some lower strandy swelling which we discussed in the past would be amenable to compression stockings. She is on daily Lasix. Blood pressure was initially elevated 158/62 of her came out a 134/60. She has managed to lose 10 pounds since I last saw her which I  commended her on as well.   Current Outpatient Prescriptions  Medication Sig Dispense Refill  . ACCU-CHEK AVIVA PLUS test strip     . ACCU-CHEK SOFTCLIX LANCETS lancets     . amitriptyline (ELAVIL) 25 MG tablet Take 25 mg by mouth at bedtime.     [name] amLODipine (NORVASC) 10 MG tablet Take 5 mg by mouth daily.     [name] aspirin 81 MG chewable tablet Chew 81 mg by mouth daily.    [name] atenolol (TENORMIN) 25 MG tablet Take 0.5 tablets (12.5 mg total) by mouth daily. PLEASE CONTACT OFFICE FOR ADDITIONAL REFILLS 2ND ATTEMPT 90 tablet 4  . atorvastatin (LIPITOR) 80 MG tablet Take 1 tablet (80 mg total) by mouth every evening. SCHEDULE APPT FOR FUTURE REFILLS 15 tablet 0  . clotrimazole-betamethasone (LOTRISONE) cream Apply 1 application topically 3 (three) times daily.     . colesevelam (WELCHOL) 625 MG tablet Take 1,875 mg by mouth 2 (two) times daily with a meal. *May take 6 tablets once a day with meals*    . diazepam (VALIUM) 2 MG tablet Take 1 tablet (2 mg total) by mouth 2 (two) times daily. 5 tablet 0  . ELIQUIS 5 MG TABS tablet TAKE (1) TABLET BY MOUTH TWICE DAILY. 180 tablet 0  . furosemide (LASIX) 40 MG tablet Take 1 tablet (40 mg total) by mouth every morning.    [name] glimepiride (AMARYL)  2 MG tablet Take 6 mg by mouth daily with breakfast.     . HUMALOG 100 UNIT/ML injection Inject 1-10 Units into the skin 3 (three) times daily with meals.     . insulin glargine (LANTUS) 100 UNIT/ML injection Inject 27 Units into the skin at bedtime.    [name] lisinopril (PRINIVIL,ZESTRIL) 20 MG tablet Take 20 mg by mouth daily.    . meclizine (ANTIVERT) 12.5 MG tablet Take 1 tablet (12.5 mg total) by mouth 3 (three) times daily as needed for dizziness. 30 tablet 0  . nitroGLYCERIN (NITROSTAT) 0.4 MG SL tablet Place 1 tablet (0.4 mg total) under the tongue every 5 (five) minutes as needed for chest pain (up to 3 doses). 25 tablet 3  . tiZANidine (ZANAFLEX) 4 MG tablet Take 4 mg by mouth daily.     No current  facility-administered medications for this visit.     Allergies  Allergen Reactions  . Penicillins Hives    Has patient had a PCN reaction causing immediate rash, facial/tongue/throat swelling, SOB or lightheadedness with hypotension: no Has patient had a PCN reaction causing severe rash involving mucus membranes or skin necrosis: No  Has patient had a PCN reaction that required hospitalization: no Has patient had a PCN reaction occurring within the last 10 years: no If all of the above answers are "NO", then may proceed with Cephalosporin use.     Social History   Social History  . Marital status: Legally Separated    Spouse name: N/A  . Number of children: N/A  . Years of education: N/A   Occupational History  . Not on file.   Social History Main Topics  . Smoking status: Never Smoker  . Smokeless tobacco: Never Used  . Alcohol use No  . Drug use: No  . Sexual activity: Not Currently    Birth control/ protection: Post-menopausal   Other Topics Concern  . Not on file   Social History Narrative  . No narrative on file     Review of Systems: Pertinent items noted in HPI and remainder of comprehensive ROS otherwise negative.   Blood pressure (!) 158/62, pulse (!) 59, height 5\' 6"  (1.676 m), weight 202 lb 6.4 oz (91.8 kg).  General appearance: alert, no distress and mildly obese Neck: no carotid bruit and no JVD Lungs: clear to auscultation bilaterally Heart: Regular bradycardia Abdomen: soft, non-tender; bowel sounds normal; no masses,  no organomegaly Extremities: edema trace pedal Pulses: 2+ and symmetric Skin: Skin color, texture, turgor normal. No rashes or lesions Neurologic: Grossly normal Psych: Pleasant  EKG Sinus bradycardia 59, possible inferior infarct pattern  PROBLEM LIST: 1. CAD status post PCI to the RCA in 2015 2. PAF - CHADSVASC score of 5 on Eliquis and ASA for CAD 3. Hypertension 4. Dyslipidemia 5. IDDM with neuropathy 6. Carotid  artery stenosis/occlusion - mild 2016 7. Leg edema  ASSESSMENT AND PLAN:  1. Cassandra Adams seems to be doing well on medical therapy. She had one episode of chest pain requiring nitroglycerin. We'll refill that but it seemed to be with marked exertion. She's also had some diaphoresis which is likely related to hypoglycemia. This is been an ongoing issue and she was in the ER last fall for that. She's had no recurrent A. fib that she is aware of and remains on Eliquis and she is on low-dose aspirin for coronary disease with a prior stent to the RCA in 2015. She does have some neuropathy which contribute sore leg  edema and is on Lasix and I advised using compression stockings. Will repeat a metabolic profile and lipid profile.  Follow-up with me annually or sooner as necessary.  [name], MD, [facility] Attending Cardiologist [facility] 11/24/2016 9:02 AM

## 2016-11-24 NOTE — Patient Instructions (Addendum)
Your physician recommends that you return for lab work FASTING - CMET, lipid  Your physician wants you to follow-up in: ONE YEAR with Dr. Debara Pickett. You will receive a reminder letter in the mail two months in advance. If you don't receive a letter, please call our office to schedule the follow-up appointment.

## 2016-12-05 ENCOUNTER — Ambulatory Visit: Payer: Self-pay | Admitting: "Endocrinology

## 2017-01-19 ENCOUNTER — Observation Stay (HOSPITAL_COMMUNITY): Payer: Medicare Other

## 2017-01-19 ENCOUNTER — Inpatient Hospital Stay (HOSPITAL_COMMUNITY): Payer: Medicare Other

## 2017-01-19 ENCOUNTER — Emergency Department (HOSPITAL_COMMUNITY): Payer: Medicare Other

## 2017-01-19 ENCOUNTER — Inpatient Hospital Stay (HOSPITAL_COMMUNITY)
Admission: EM | Admit: 2017-01-19 | Discharge: 2017-01-21 | DRG: 638 | Disposition: A | Discharge status: Home Health | Payer: Medicare Other | Attending: Internal Medicine | Admitting: Internal Medicine

## 2017-01-19 ENCOUNTER — Encounter (HOSPITAL_COMMUNITY): Payer: Self-pay | Admitting: *Deleted

## 2017-01-19 DIAGNOSIS — E1142 Type 2 diabetes mellitus with diabetic polyneuropathy: Secondary | ICD-10-CM | POA: Diagnosis present

## 2017-01-19 DIAGNOSIS — M1612 Unilateral primary osteoarthritis, left hip: Secondary | ICD-10-CM | POA: Diagnosis not present

## 2017-01-19 DIAGNOSIS — Z7901 Long term (current) use of anticoagulants: Secondary | ICD-10-CM

## 2017-01-19 DIAGNOSIS — I129 Hypertensive chronic kidney disease with stage 1 through stage 4 chronic kidney disease, or unspecified chronic kidney disease: Secondary | ICD-10-CM | POA: Diagnosis not present

## 2017-01-19 DIAGNOSIS — R112 Nausea with vomiting, unspecified: Secondary | ICD-10-CM

## 2017-01-19 DIAGNOSIS — I2583 Coronary atherosclerosis due to lipid rich plaque: Secondary | ICD-10-CM | POA: Diagnosis not present

## 2017-01-19 DIAGNOSIS — E1100 Type 2 diabetes mellitus with hyperosmolarity without nonketotic hyperglycemic-hyperosmolar coma (NKHHC): Secondary | ICD-10-CM | POA: Diagnosis not present

## 2017-01-19 DIAGNOSIS — G451 Carotid artery syndrome (hemispheric): Secondary | ICD-10-CM | POA: Diagnosis not present

## 2017-01-19 DIAGNOSIS — E1122 Type 2 diabetes mellitus with diabetic chronic kidney disease: Secondary | ICD-10-CM | POA: Diagnosis not present

## 2017-01-19 DIAGNOSIS — R739 Hyperglycemia, unspecified: Secondary | ICD-10-CM

## 2017-01-19 DIAGNOSIS — E785 Hyperlipidemia, unspecified: Secondary | ICD-10-CM | POA: Diagnosis not present

## 2017-01-19 DIAGNOSIS — N189 Chronic kidney disease, unspecified: Secondary | ICD-10-CM | POA: Diagnosis present

## 2017-01-19 DIAGNOSIS — R531 Weakness: Secondary | ICD-10-CM | POA: Diagnosis not present

## 2017-01-19 DIAGNOSIS — G459 Transient cerebral ischemic attack, unspecified: Secondary | ICD-10-CM | POA: Diagnosis not present

## 2017-01-19 DIAGNOSIS — I10 Essential (primary) hypertension: Secondary | ICD-10-CM | POA: Diagnosis not present

## 2017-01-19 DIAGNOSIS — N179 Acute kidney failure, unspecified: Secondary | ICD-10-CM | POA: Diagnosis not present

## 2017-01-19 DIAGNOSIS — E860 Dehydration: Secondary | ICD-10-CM | POA: Diagnosis present

## 2017-01-19 DIAGNOSIS — I6529 Occlusion and stenosis of unspecified carotid artery: Secondary | ICD-10-CM

## 2017-01-19 DIAGNOSIS — E1165 Type 2 diabetes mellitus with hyperglycemia: Secondary | ICD-10-CM | POA: Diagnosis not present

## 2017-01-19 DIAGNOSIS — Z8249 Family history of ischemic heart disease and other diseases of the circulatory system: Secondary | ICD-10-CM | POA: Diagnosis not present

## 2017-01-19 DIAGNOSIS — N183 Chronic kidney disease, stage 3 (moderate): Secondary | ICD-10-CM | POA: Diagnosis present

## 2017-01-19 DIAGNOSIS — I480 Paroxysmal atrial fibrillation: Secondary | ICD-10-CM | POA: Diagnosis not present

## 2017-01-19 DIAGNOSIS — Z7982 Long term (current) use of aspirin: Secondary | ICD-10-CM

## 2017-01-19 DIAGNOSIS — I340 Nonrheumatic mitral (valve) insufficiency: Secondary | ICD-10-CM | POA: Diagnosis not present

## 2017-01-19 DIAGNOSIS — Z880 Allergy status to penicillin: Secondary | ICD-10-CM | POA: Diagnosis not present

## 2017-01-19 DIAGNOSIS — I6521 Occlusion and stenosis of right carotid artery: Secondary | ICD-10-CM | POA: Diagnosis not present

## 2017-01-19 DIAGNOSIS — R29818 Other symptoms and signs involving the nervous system: Secondary | ICD-10-CM | POA: Diagnosis not present

## 2017-01-19 DIAGNOSIS — I252 Old myocardial infarction: Secondary | ICD-10-CM

## 2017-01-19 DIAGNOSIS — D573 Sickle-cell trait: Secondary | ICD-10-CM | POA: Diagnosis present

## 2017-01-19 DIAGNOSIS — Z794 Long term (current) use of insulin: Secondary | ICD-10-CM

## 2017-01-19 DIAGNOSIS — I2510 Atherosclerotic heart disease of native coronary artery without angina pectoris: Secondary | ICD-10-CM | POA: Diagnosis present

## 2017-01-19 DIAGNOSIS — Z955 Presence of coronary angioplasty implant and graft: Secondary | ICD-10-CM | POA: Diagnosis not present

## 2017-01-19 DIAGNOSIS — N184 Chronic kidney disease, stage 4 (severe): Secondary | ICD-10-CM | POA: Diagnosis present

## 2017-01-19 DIAGNOSIS — R42 Dizziness and giddiness: Secondary | ICD-10-CM

## 2017-01-19 DIAGNOSIS — R29898 Other symptoms and signs involving the musculoskeletal system: Secondary | ICD-10-CM

## 2017-01-19 DIAGNOSIS — R945 Abnormal results of liver function studies: Secondary | ICD-10-CM | POA: Diagnosis not present

## 2017-01-19 HISTORY — DX: Dizziness and giddiness: R42

## 2017-01-19 LAB — CBC WITH DIFFERENTIAL/PLATELET
Basophils Absolute: 0 10*3/uL (ref 0.0–0.1)
Basophils Relative: 1 %
EOS ABS: 0.1 10*3/uL (ref 0.0–0.7)
EOS PCT: 2 %
HCT: 36.7 % (ref 36.0–46.0)
HEMOGLOBIN: 12.8 g/dL (ref 12.0–15.0)
LYMPHS ABS: 0.7 10*3/uL (ref 0.7–4.0)
LYMPHS PCT: 16 %
MCH: 27.5 pg (ref 26.0–34.0)
MCHC: 34.9 g/dL (ref 30.0–36.0)
MCV: 78.8 fL (ref 78.0–100.0)
MONOS PCT: 6 %
Monocytes Absolute: 0.3 10*3/uL (ref 0.1–1.0)
Neutro Abs: 3.2 10*3/uL (ref 1.7–7.7)
Neutrophils Relative %: 76 %
Platelets: 159 10*3/uL (ref 150–400)
RBC: 4.66 MIL/uL (ref 3.87–5.11)
RDW: 14.6 % (ref 11.5–15.5)
WBC: 4.2 10*3/uL (ref 4.0–10.5)

## 2017-01-19 LAB — CBG MONITORING, ED
GLUCOSE-CAPILLARY: 503 mg/dL — AB (ref 65–99)
GLUCOSE-CAPILLARY: 352 mg/dL — AB (ref 65–99)
Glucose-Capillary: 585 mg/dL (ref 65–99)
Glucose-Capillary: 476 mg/dL — ABNORMAL HIGH (ref 65–99)

## 2017-01-19 LAB — ECHOCARDIOGRAM COMPLETE
AVLVOTPG: 5 mmHg
CHL CUP DOP CALC LVOT VTI: 27.2 cm
E decel time: 197 msec
EERAT: 7.52
FS: 32 % (ref 28–44)
Height: 66 in
IVS/LV PW RATIO, ED: 1.34
LA diam index: 1.6 cm/m2
LA vol A4C: 73.1 ml
LA vol index: 34.6 mL/m2
LASIZE: 33 mm
LAVOL: 71.2 mL
LEFT ATRIUM END SYS DIAM: 33 mm
LV SIMPSON'S DISK: 57
LV dias vol index: 44 mL/m2
LV dias vol: 91 mL (ref 46–106)
LVEEAVG: 7.52
LVEEMED: 7.52
LVELAT: 10.2 cm/s
LVOT area: 3.14 cm2
LVOT diameter: 20 mm
LVOT peak vel: 107 cm/s
LVOTSV: 85 mL
LVSYSVOL: 39 mL
LVSYSVOLIN: 19 mL/m2
Lateral S' vel: 14.7 cm/s
MV Dec: 197
MV pk A vel: 56.8 m/s
MVPG: 2 mmHg
MVPKEVEL: 76.7 m/s
PW: 10.9 mm — AB (ref 0.6–1.1)
Stroke v: 52 ml
TAPSE: 19.1 mm
TDI e' lateral: 10.2
TDI e' medial: 4.9
Weight: 3125.24 oz

## 2017-01-19 LAB — LIPASE, BLOOD: Lipase: 17 U/L (ref 11–51)

## 2017-01-19 LAB — URINALYSIS, ROUTINE W REFLEX MICROSCOPIC
BACTERIA UA: NONE SEEN
Bilirubin Urine: NEGATIVE
Glucose, UA: >=500 mg/dL — AB
Ketones, ur: 5 mg/dL — AB
Nitrite: NEGATIVE
PROTEIN: 30 mg/dL — AB
Specific Gravity, Urine: 1.014 (ref 1.005–1.030)
pH: 6 (ref 5.0–8.0)

## 2017-01-19 LAB — GLUCOSE, CAPILLARY
GLUCOSE-CAPILLARY: 176 mg/dL — AB (ref 65–99)
GLUCOSE-CAPILLARY: 277 mg/dL — AB (ref 65–99)
GLUCOSE-CAPILLARY: 79 mg/dL (ref 65–99)
GLUCOSE-CAPILLARY: 67 mg/dL (ref 65–99)
GLUCOSE-CAPILLARY: 155 mg/dL — AB (ref 65–99)
GLUCOSE-CAPILLARY: 162 mg/dL — AB (ref 65–99)
Glucose-Capillary: 120 mg/dL — ABNORMAL HIGH (ref 65–99)

## 2017-01-19 LAB — COMPREHENSIVE METABOLIC PANEL
ALBUMIN: 3.7 g/dL (ref 3.5–5.0)
ALK PHOS: 121 U/L (ref 38–126)
ALT: 77 U/L — AB (ref 14–54)
AST: 69 U/L — AB (ref 15–41)
Anion gap: 10 (ref 5–15)
BILIRUBIN TOTAL: 0.4 mg/dL (ref 0.3–1.2)
BUN: 28 mg/dL — AB (ref 6–20)
CALCIUM: 10 mg/dL (ref 8.9–10.3)
CO2: 24 mmol/L (ref 22–32)
Chloride: 101 mmol/L (ref 101–111)
Creatinine, Ser: 1.73 mg/dL — ABNORMAL HIGH (ref 0.44–1.00)
GFR calc Af Amer: 33 mL/min — ABNORMAL LOW (ref >60)
GFR calc non Af Amer: 28 mL/min — ABNORMAL LOW (ref >60)
GLUCOSE: 614 mg/dL — AB (ref 65–99)
Potassium: 4.9 mmol/L (ref 3.5–5.1)
Sodium: 135 mmol/L (ref 135–145)
TOTAL PROTEIN: 7.3 g/dL (ref 6.5–8.1)

## 2017-01-19 LAB — APTT: aPTT: 33 seconds (ref 24–36)

## 2017-01-19 LAB — I-STAT TROPONIN, ED: TROPONIN I, POC: 0.02 ng/mL (ref 0.00–0.08)

## 2017-01-19 LAB — MRSA PCR SCREENING: MRSA by PCR: NEGATIVE

## 2017-01-19 LAB — PROTIME-INR
INR: 1.13
PROTHROMBIN TIME: 14.5 s (ref 11.4–15.2)

## 2017-01-19 LAB — TROPONIN I: Troponin I: <0.03 ng/mL (ref <0.03)

## 2017-01-19 MED ORDER — ASPIRIN 81 MG PO CHEW
81.0000 mg | CHEWABLE_TABLET | Freq: Every day | ORAL | Status: DC
  Administered 2017-01-19 – 2017-01-21 (×3): 81 mg via ORAL
  Filled 2017-01-19 (×3): qty 1

## 2017-01-19 MED ORDER — STROKE: EARLY STAGES OF RECOVERY BOOK
Freq: Once | Status: AC
  Filled 2017-01-19 (×2): qty 1

## 2017-01-19 MED ORDER — SODIUM CHLORIDE 0.9 % IV BOLUS (SEPSIS)
1000.0000 mL | Freq: Once | INTRAVENOUS | Status: AC
  Administered 2017-01-19: 1000 mL via INTRAVENOUS

## 2017-01-19 MED ORDER — SODIUM CHLORIDE 0.9 % IV SOLN
INTRAVENOUS | Status: DC
  Administered 2017-01-19: 4.4 [IU]/h via INTRAVENOUS
  Filled 2017-01-19: qty 1

## 2017-01-19 MED ORDER — SODIUM CHLORIDE 0.9 % IV SOLN
INTRAVENOUS | Status: DC
  Administered 2017-01-19: 4.3 [IU]/h via INTRAVENOUS
  Filled 2017-01-19: qty 1

## 2017-01-19 MED ORDER — INSULIN REGULAR BOLUS VIA INFUSION
0.0000 [IU] | Freq: Three times a day (TID) | INTRAVENOUS | Status: DC
  Filled 2017-01-19: qty 10

## 2017-01-19 MED ORDER — SODIUM CHLORIDE 0.9 % IV SOLN
INTRAVENOUS | Status: DC
  Administered 2017-01-19: 16:00:00 via INTRAVENOUS

## 2017-01-19 MED ORDER — ACETAMINOPHEN 160 MG/5ML PO SOLN: 650.0000 mg | ORAL | Status: DC | PRN

## 2017-01-19 MED ORDER — ACETAMINOPHEN 325 MG PO TABS: 650.0000 mg | ORAL_TABLET | ORAL | Status: DC | PRN

## 2017-01-19 MED ORDER — DEXTROSE 50 % IV SOLN
25.0000 mL | INTRAVENOUS | Status: DC | PRN
  Administered 2017-01-19: 25 mL via INTRAVENOUS
  Filled 2017-01-19: qty 50

## 2017-01-19 MED ORDER — INSULIN ASPART 100 UNIT/ML ~~LOC~~ SOLN
1.0000 [IU] | SUBCUTANEOUS | Status: DC
  Administered 2017-01-20 (×2): 3 [IU] via SUBCUTANEOUS

## 2017-01-19 MED ORDER — AMITRIPTYLINE HCL 25 MG PO TABS
25.0000 mg | ORAL_TABLET | Freq: Every day | ORAL | Status: DC
  Administered 2017-01-19 – 2017-01-20 (×2): 25 mg via ORAL
  Filled 2017-01-19 (×2): qty 1

## 2017-01-19 MED ORDER — INSULIN GLARGINE 100 UNIT/ML ~~LOC~~ SOLN
10.0000 [IU] | SUBCUTANEOUS | Status: DC
  Administered 2017-01-20: 10 [IU] via SUBCUTANEOUS
  Filled 2017-01-19 (×3): qty 0.1

## 2017-01-19 MED ORDER — ACETAMINOPHEN 650 MG RE SUPP: 650.0000 mg | RECTAL | Status: DC | PRN

## 2017-01-19 MED ORDER — DIAZEPAM 2 MG PO TABS
2.0000 mg | ORAL_TABLET | Freq: Two times a day (BID) | ORAL | Status: DC
  Administered 2017-01-19 – 2017-01-21 (×4): 2 mg via ORAL
  Filled 2017-01-19 (×5): qty 1

## 2017-01-19 MED ORDER — INSULIN ASPART 100 UNIT/ML ~~LOC~~ SOLN
10.0000 [IU] | Freq: Once | SUBCUTANEOUS | Status: AC
  Administered 2017-01-19: 10 [IU] via SUBCUTANEOUS
  Filled 2017-01-19: qty 1

## 2017-01-19 MED ORDER — COLESEVELAM HCL 625 MG PO TABS
1875.0000 mg | ORAL_TABLET | Freq: Two times a day (BID) | ORAL | Status: DC
  Administered 2017-01-20 – 2017-01-21 (×3): 1875 mg via ORAL
  Filled 2017-01-19 (×6): qty 3

## 2017-01-19 MED ORDER — APIXABAN 5 MG PO TABS
5.0000 mg | ORAL_TABLET | Freq: Two times a day (BID) | ORAL | Status: DC
  Administered 2017-01-19 – 2017-01-21 (×4): 5 mg via ORAL
  Filled 2017-01-19 (×5): qty 1

## 2017-01-19 MED ORDER — TIZANIDINE HCL 4 MG PO TABS
4.0000 mg | ORAL_TABLET | Freq: Every day | ORAL | Status: DC
  Administered 2017-01-19 – 2017-01-21 (×3): 4 mg via ORAL
  Filled 2017-01-19 (×6): qty 1

## 2017-01-19 MED ORDER — ATORVASTATIN CALCIUM 40 MG PO TABS
80.0000 mg | ORAL_TABLET | Freq: Every evening | ORAL | Status: DC
  Administered 2017-01-19 – 2017-01-20 (×2): 80 mg via ORAL
  Filled 2017-01-19 (×2): qty 2

## 2017-01-19 MED ORDER — SENNOSIDES-DOCUSATE SODIUM 8.6-50 MG PO TABS
2.0000 | ORAL_TABLET | Freq: Every day | ORAL | Status: DC
  Administered 2017-01-19 – 2017-01-20 (×2): 2 via ORAL
  Filled 2017-01-19 (×2): qty 2

## 2017-01-19 NOTE — ED Provider Notes (Signed)
[facility] DEPT Provider Note   CSN: [id number] Arrival date & time: 01/19/17  0825     History   Chief Complaint Chief Complaint  Patient presents with  . Hyperglycemia  . Weakness    HPI SHALAH ESTELLE is a 71 y.o. female.  HPI Pt was seen at 0850. Per pt, c/o gradual onset and persistence of constant generalized weakness and body aches that began yesterday. Has been associated with several episodes of N/V, lightheadedness, and elevated CBG's. Pt did not take her full dose of insulin last night due to her symptoms. Denies CP/palpitations, no SOB/cough, no abd pain, no diarrhea, no black or blood in emesis, no back pain, no fevers, no rash, no focal motor weakness.   Past Medical History:  Diagnosis Date  . Arthritis    "all over"  . CAD (coronary artery disease)    a. NSTEMI 12/2013 - occluded dominant RCA with L-R collaterals, moderate prox segmental LAD disease, for medical therapy initially, EF 60% with subtle inferobasal hypokinesia. Consider PCI for refractory CP.  [name] CKD (chronic kidney disease), stage III   . Hypertension   . Migraines    "weekly" (01/06/2014)  . Sickle cell trait ([place])   . TIA (transient ischemic attack) 1978  . Type II diabetes mellitus ([place])   . Vertigo     Patient Active Problem List   Diagnosis Date Noted  . Dyslipidemia 11/24/2016  . Edema 03/30/2016  . Diaphoresis 02/18/2016  . Hypoglycemia 02/18/2016  . Bradycardia 04/22/2015  . CAD (coronary artery disease) 02/26/2014  . Atrial fibrillation ([place]) 02/26/2014  . NSTEMI (non-ST elevated myocardial infarction) ([place]) 01/03/2014  . Occlusion and stenosis of carotid artery without mention of cerebral infarction 11/21/2013  . Pain in limb-Left neck 11/21/2013  . Carotid stenosis, bilateral 11/17/2011  . HYPERTHYROIDISM, SUBCLINICAL 02/15/2010  . GERD 02/15/2010  . DYSPHAGIA UNSPECIFIED 02/15/2010  . ABDOMINAL PAIN, UNSPECIFIED SITE 02/15/2010  . Controlled insulin-dependent  diabetes mellitus with neuropathy ([place]) 02/11/2010  . Essential hypertension 02/11/2010  . HEADACHE 02/11/2010    Past Surgical History:  Procedure Laterality Date  . APPENDECTOMY  March 2014   had appendix frozen  . CARDIAC CATHETERIZATION  "years ago" & 01/05/2014  . CARDIOVERSION N/A 03/17/2014   Procedure: CARDIOVERSION;  Surgeon: [name], MD;  Location: [facility] ENDOSCOPY;  Service: Cardiovascular;  Laterality: N/A;  . CATARACT EXTRACTION W/ INTRAOCULAR LENS  IMPLANT, BILATERAL Bilateral 04/2013-05/2013  . ESOPHAGOGASTRODUODENOSCOPY  11/08/2007    Normal esophagus without evidence of Barrett, mass, erosion/ Normal stomach, duodenal bulb  . GASTRIC MOTILITY STUDY  11/13/2007   mildly delayed emptying subjectively, but normal  analysis-77% of tracer emptied at 2 hours  . JOINT REPLACEMENT    . LAPAROSCOPIC APPENDECTOMY N/A 09/15/2012   Procedure: APPENDECTOMY LAPAROSCOPIC;  Surgeon: [name], MD;  Location: [facility] ORS;  Service: General;  Laterality: N/A;  . LEFT HEART CATHETERIZATION WITH CORONARY ANGIOGRAM N/A 01/05/2014   Procedure: LEFT HEART CATHETERIZATION WITH CORONARY ANGIOGRAM;  Surgeon: [name], MD;  Location: [facility] CATH LAB;  Service: Cardiovascular;  Laterality: N/A;  . REPLACEMENT TOTAL KNEE Right 05-03-06  . TEE WITHOUT CARDIOVERSION N/A 03/17/2014   Procedure: TRANSESOPHAGEAL ECHOCARDIOGRAM (TEE);  Surgeon: [name], MD;  Location: [facility] ENDOSCOPY;  Service: Cardiovascular;  Laterality: N/A;  . TONSILLECTOMY  1972    OB History    Gravida Para Term Preterm AB Living   2 2 2     2    SAB TAB Ectopic Multiple Live  Births                   Home Medications    Prior to Admission medications   Medication Sig Start Date End Date Taking? Authorizing Provider  ACCU-CHEK AVIVA PLUS test strip  03/03/16   [provider]  ACCU-CHEK SOFTCLIX LANCETS lancets  03/03/16   [provider]  amitriptyline (ELAVIL) 25 MG tablet Take 25 mg by mouth at  bedtime.  10/17/12   Rolland Porter, MD  amLODipine (NORVASC) 10 MG tablet Take 5 mg by mouth daily.  02/25/16   [provider]  apixaban (ELIQUIS) 5 MG TABS tablet Take 1 tablet (5 mg total) by mouth 2 (two) times daily. 11/24/16   Hilty, Nadean Corwin, MD  aspirin 81 MG chewable tablet Chew 81 mg by mouth daily.    [provider]  atenolol (TENORMIN) 25 MG tablet Take 0.5 tablets (12.5 mg total) by mouth daily. 11/24/16   Hilty, Nadean Corwin, MD  atorvastatin (LIPITOR) 80 MG tablet Take 1 tablet (80 mg total) by mouth every evening. 11/24/16   Hilty, Nadean Corwin, MD  clotrimazole-betamethasone (LOTRISONE) cream Apply 1 application topically 3 (three) times daily.  01/13/14   [provider]  colesevelam (WELCHOL) 625 MG tablet Take 1,875 mg by mouth 2 (two) times daily with a meal. *May take 6 tablets once a day with meals*    [provider]  diazepam (VALIUM) 2 MG tablet Take 1 tablet (2 mg total) by mouth 2 (two) times daily. 09/24/15   Rancour, Annie Main, MD  furosemide (LASIX) 40 MG tablet Take 1 tablet (40 mg total) by mouth every morning. 01/07/14   Dunn, Nedra Hai, PA-C  glimepiride (AMARYL) 2 MG tablet Take 6 mg by mouth daily with breakfast.     [provider]  HUMALOG 100 UNIT/ML injection Inject 1-10 Units into the skin 3 (three) times daily with meals.  02/25/16   [provider]  insulin glargine (LANTUS) 100 UNIT/ML injection Inject 27 Units into the skin at bedtime.    [provider]  lisinopril (PRINIVIL,ZESTRIL) 20 MG tablet Take 20 mg by mouth daily.    [provider]  meclizine (ANTIVERT) 12.5 MG tablet Take 1 tablet (12.5 mg total) by mouth 3 (three) times daily as needed for dizziness. 09/24/15   Rancour, Annie Main, MD  nitroGLYCERIN (NITROSTAT) 0.4 MG SL tablet Place 1 tablet (0.4 mg total) under the tongue every 5 (five) minutes as needed for chest pain (up to 3 doses). 11/24/16   Hilty, Nadean Corwin, MD  tiZANidine (ZANAFLEX) 4 MG  tablet Take 4 mg by mouth daily. 12/09/15   [provider]    Family History Family History  Problem Relation Age of Onset  . Hyperlipidemia Mother   . Hypertension Mother   . Heart attack Mother   . Cancer Father        lung  . Hypertension Sister   . Diabetes Brother   . Heart disease Brother   . Hyperlipidemia Brother   . Hypertension Brother   . Heart attack Brother   . Other Brother        DVT    Social History Social History  Substance Use Topics  . Smoking status: Never Smoker  . Smokeless tobacco: Never Used  . Alcohol use No     Allergies   Penicillins   Review of Systems Review of Systems ROS: Statement: All systems negative except as marked or noted in the  HPI; Constitutional: Negative for fever and chills. +generalized weakness and body aches.; ; Eyes: Negative for eye pain, redness and discharge. ; ; ENMT: Negative for ear pain, hoarseness, nasal congestion, sinus pressure and sore throat. ; ; Cardiovascular: Negative for chest pain, palpitations, diaphoresis, dyspnea and peripheral edema. ; ; Respiratory: Negative for cough, wheezing and stridor. ; ; Gastrointestinal: +N/V. Negative for diarrhea, abdominal pain, blood in stool, hematemesis, jaundice and rectal bleeding. . ; ; Genitourinary: Negative for dysuria, flank pain and hematuria. ; ; Musculoskeletal: Negative for back pain and neck pain. Negative for swelling and trauma.; ; Skin: Negative for pruritus, rash, abrasions, blisters, bruising and skin lesion.; ; Neuro: +lightheadedness. Negative for headache and neck stiffness. Negative for altered level of consciousness, altered mental status, extremity weakness, paresthesias, involuntary movement, seizure and syncope.       Physical Exam Updated Vital Signs BP (!) 157/82   Pulse 89   Temp 98.3 F (36.8 C) (Oral)   Resp 16   Ht 5\' 6"  (1.676 m)   Wt 92.1 kg (203 lb)   SpO2 99%   BMI 32.77 kg/m    09:12:58 Orthostatic Vital Signs JD    Orthostatic Lying   BP- Lying: 142/62  Pulse- Lying: 81      Orthostatic Sitting  BP- Sitting: 122/65  Pulse- Sitting: 83      Orthostatic Standing at 0 minutes  BP- Standing at 0 minutes: 118/65  Pulse- Standing at 0 minutes: 94     Physical Exam 0850: Physical examination:  Nursing notes reviewed; Vital signs and O2 SAT reviewed;  Constitutional: Well developed, Well nourished, In no acute distress; Head:  Normocephalic, atraumatic; Eyes: EOMI, PERRL, No scleral icterus; ENMT: Mouth and pharynx normal, Mucous membranes dry; Neck: Supple, Full range of motion, No lymphadenopathy; Cardiovascular: Regular rate and rhythm, No gallop; Respiratory: Breath sounds clear & equal bilaterally, No wheezes.  Speaking full sentences with ease, Normal respiratory effort/excursion; Chest: Nontender, Movement normal; Abdomen: Soft, Nontender, Nondistended, Normal bowel sounds; Genitourinary: No CVA tenderness; Extremities: Pulses normal, No tenderness, No edema, No calf edema or asymmetry.; Neuro: AA&Ox3, Major CN grossly intact. No facial droop. Speech clear. No gross focal motor or sensory deficits in extremities.; Skin: Color normal, Warm, Dry.   ED Treatments / Results  Labs (all labs ordered are listed, but only abnormal results are displayed)   EKG  EKG Interpretation  Date/Time:  Friday January 19 2017 08:58:56 EDT Ventricular Rate:  93 PR Interval:    QRS Duration: 110 QT Interval:  329 QTC Calculation: 410 R Axis:   57 Text Interpretation:  Sinus rhythm Inferior infarct, age indeterminate Lateral leads are also involved When compared with ECG of 11/24/2016 Rate faster Confirmed by [facility]  MD, Nunzio Cory [phone]) on 01/19/2017 9:40:51 AM       Radiology   Procedures Procedures (including critical care time)  Medications Ordered in ED Medications  insulin regular (NOVOLIN R,HUMULIN R) 100 Units in sodium chloride 0.9 % 100 mL (1 Units/mL) infusion (not administered)  sodium  chloride 0.9 % bolus 1,000 mL (1,000 mLs Intravenous New Bag/Given 01/19/17 0925)  insulin aspart (novoLOG) injection 10 Units (10 Units Subcutaneous Given 01/19/17 0911)     Initial Impression / Assessment and Plan / ED Course  I have reviewed the triage vital signs and the nursing notes.  Pertinent labs & imaging results that were available during my care of the patient were reviewed by me and considered in my medical decision making (see chart for  details).  MDM Reviewed: previous chart, nursing note and vitals Reviewed previous: labs and ECG Interpretation: labs, ECG, x-ray and ultrasound   Results for orders placed or performed during the hospital encounter of 01/19/17  Urinalysis, Routine w reflex microscopic  Result Value Ref Range   Color, Urine STRAW (A) YELLOW   APPearance CLEAR CLEAR   Specific Gravity, Urine 1.014 1.005 - 1.030   pH 6.0 5.0 - 8.0   Glucose, UA >=500 (A) NEGATIVE mg/dL   Hgb urine dipstick SMALL (A) NEGATIVE   Bilirubin Urine NEGATIVE NEGATIVE   Ketones, ur 5 (A) NEGATIVE mg/dL   Protein, ur 30 (A) NEGATIVE mg/dL   Nitrite NEGATIVE NEGATIVE   Leukocytes, UA LARGE (A) NEGATIVE   RBC / HPF 0-5 0 - 5 RBC/hpf   WBC, UA 6-30 0 - 5 WBC/hpf   Bacteria, UA NONE SEEN NONE SEEN   Squamous Epithelial / LPF 0-5 (A) NONE SEEN   Mucous PRESENT   Comprehensive metabolic panel  Result Value Ref Range   Sodium 135 135 - 145 mmol/L   Potassium 4.9 3.5 - 5.1 mmol/L   Chloride 101 101 - 111 mmol/L   CO2 24 22 - 32 mmol/L   Glucose, Bld 614 (HH) 65 - 99 mg/dL   BUN 28 (H) 6 - 20 mg/dL   Creatinine, Ser 1.73 (H) 0.44 - 1.00 mg/dL   Calcium 10.0 8.9 - 10.3 mg/dL   Total Protein 7.3 6.5 - 8.1 g/dL   Albumin 3.7 3.5 - 5.0 g/dL   AST 69 (H) 15 - 41 U/L   ALT 77 (H) 14 - 54 U/L   Alkaline Phosphatase 121 38 - 126 U/L   Total Bilirubin 0.4 0.3 - 1.2 mg/dL   GFR calc non Af Amer 28 (L) >60 mL/min   GFR calc Af Amer 33 (L) >60 mL/min   Anion gap 10 5 - 15  Lipase,  blood  Result Value Ref Range   Lipase 17 11 - 51 U/L  Troponin I  Result Value Ref Range   Troponin I <0.03 <0.03 ng/mL  CBC with Differential  Result Value Ref Range   WBC 4.2 4.0 - 10.5 K/uL   RBC 4.66 3.87 - 5.11 MIL/uL   Hemoglobin 12.8 12.0 - 15.0 g/dL   HCT 36.7 36.0 - 46.0 %   MCV 78.8 78.0 - 100.0 fL   MCH 27.5 26.0 - 34.0 pg   MCHC 34.9 30.0 - 36.0 g/dL   RDW 14.6 11.5 - 15.5 %   Platelets 159 150 - 400 K/uL   Neutrophils Relative % 76 %   Neutro Abs 3.2 1.7 - 7.7 K/uL   Lymphocytes Relative 16 %   Lymphs Abs 0.7 0.7 - 4.0 K/uL   Monocytes Relative 6 %   Monocytes Absolute 0.3 0.1 - 1.0 K/uL   Eosinophils Relative 2 %   Eosinophils Absolute 0.1 0.0 - 0.7 K/uL   Basophils Relative 1 %   Basophils Absolute 0.0 0.0 - 0.1 K/uL  CBG monitoring, ED  Result Value Ref Range   Glucose-Capillary 585 (HH) 65 - 99 mg/dL   Comment 1 Call MD NNP PA CNM   CBG monitoring, ED  Result Value Ref Range   Glucose-Capillary 503 (HH) 65 - 99 mg/dL   US Abdomen Complete Result Date: 01/19/2017 CLINICAL DATA:  Nausea and vomiting, elevated LFTs. EXAM: ABDOMEN ULTRASOUND COMPLETE COMPARISON:  CT abdomen pelvis dated September 15, 2012. FINDINGS: Gallbladder: No gallstones or wall thickening visualized. No sonographic  Murphy sign noted by sonographer. Common bile duct: Diameter: 2.7 mm, normal. Liver: No focal lesion identified. Within normal limits in parenchymal echogenicity. IVC: No abnormality visualized. Pancreas: Visualized portion unremarkable. Spleen: Size and appearance within normal limits. Right Kidney: Length: 9.7 cm. Echogenicity within normal limits. No mass or hydronephrosis visualized. Left Kidney: Length: 9.0 cm. Echogenicity within normal limits. No mass or hydronephrosis visualized. Abdominal aorta: No aneurysm visualized. Other findings: None. IMPRESSION: Normal abdominal ultrasound. Electronically Signed   By: Titus Dubin M.D.   On: 01/19/2017 11:24   Dg Abd Acute  W/chest Result Date: 01/19/2017 CLINICAL DATA:  Nausea and vomiting. EXAM: DG ABDOMEN ACUTE W/ 1V CHEST COMPARISON:  01/03/2014. FINDINGS: Heart size stable. Mild bilateral interstitial prominence noted. Pneumonitis cannot be excluded. Soft tissues of the abdomen are unremarkable. Stool noted throughout the colon. No free air. Pelvic calcifications consistent with fibroids. Thoracolumbar spine scoliosis. IMPRESSION: 1. Diffuse mild bilateral interstitial prominence consistent with pneumonitis. 2. No acute intra-abdominal abnormality. Stool noted throughout the colon. 3. Uterine fibroids. Electronically Signed   By: Marcello Moores  Register   On: 01/19/2017 10:43    1140:  Pt orthostatic on VS with elevated CBG on arrival. SQ insulin 10 units given and NS 1L IV.  CBG minimally decreased (actually higher on CMP).  IV insulin gtt started. No clear UTI on Udip.  Pneumonitis on AXR, but O2 Sat and WBC count normal. LFT's mildly elevated, but [place] abd reassuring. T/C to [facility]. Roderic Palau, case discussed, including:  HPI, pertinent PM/SHx, VS/PE, dx testing, ED course and treatment:  Agreeable to admit.   Final Clinical Impressions(s) / ED Diagnoses   Final diagnoses:  None    New Prescriptions New Prescriptions   No medications on file     Saraiah, Bhat, DO 01/22/17 2145

## 2017-01-19 NOTE — ED Notes (Signed)
Preparing to take pt to the ICU when she reported that she cannot move her left leg  Pt can move all extremities except L leg which she cannot hold up - pt was walking less that 30 minutes ago

## 2017-01-19 NOTE — ED Notes (Signed)
hospitalist in to assess  

## 2017-01-19 NOTE — ED Notes (Signed)
SOS in progress

## 2017-01-19 NOTE — ED Notes (Signed)
Call to floor - Doroteo Bradford, RN will call back

## 2017-01-19 NOTE — ED Notes (Signed)
When lifted left leg up pt c/o pain up leg into hip that just started. Pt states cant move leg due to pain

## 2017-01-19 NOTE — H&P (Signed)
History and Physical    JESYCA WEISENBURGER PXT:[id number] DOB: 06-Dec-1945 DOA: 01/19/2017  PCP: Redmond School, MD  Patient coming from: home  I have personally briefly reviewed patient's old medical records in [place]  Chief Complaint: generalized weakness  HPI: FRANCHON KETTERMAN is a 71 y.o. female with medical history significant of hypertension, diabetes, peripheral neuropathy, coronary artery disease with stenting in the past, chronic kidney disease stage III, paroxysmal atrial fibrillation on anticoagulation, presented to the hospital with complaints of generalized weakness and hyperglycemia. Patient reports generalized weakness and myalgias that began yesterday. She had several episodes of nausea, vomiting and lightheadedness. Blood sugars were noted to be elevated. She had poor by mouth intake and therefore took a reduced dose of insulin concerned that her blood sugar may drop with a full dose. Since she continued to feel poorly this morning, she came to the ER for evaluation. She denies any fevers, shortness of breath, chest pain, cough, diarrhea.  ED Course: In the emergency room she was noted to be significantly hyperglycemic with a serum glucose of 614. Serum bicarbonate was 24 and anion gap was 10. She does have a mild increase in creatinine to 1.7, baseline 1.3. Urinalysis did not show any signs of infection. Plan was to admit the patient for blood sugar control and hydration. During her ER stay, she suddenly developed left lower extremity weakness and was unable to lift her leg off the bed. Code stroke was activated. She is being admitted for further treatments.  Review of Systems: As per HPI otherwise 10 point review of systems negative.    Past Medical History:  Diagnosis Date  . Arthritis    "all over"  . CAD (coronary artery disease)    a. NSTEMI 12/2013 - occluded dominant RCA with L-R collaterals, moderate prox segmental LAD disease, for medical therapy  initially, EF 60% with subtle inferobasal hypokinesia. Consider PCI for refractory CP.  [name] CKD (chronic kidney disease), stage III   . Hypertension   . Migraines    "weekly" (01/06/2014)  . Sickle cell trait ([place])   . TIA (transient ischemic attack) 1978  . Type II diabetes mellitus ([place])   . Vertigo     Past Surgical History:  Procedure Laterality Date  . APPENDECTOMY  March 2014   had appendix frozen  . CARDIAC CATHETERIZATION  "years ago" & 01/05/2014  . CARDIOVERSION N/A 03/17/2014   Procedure: CARDIOVERSION;  Surgeon: [name], MD;  Location: [facility] ENDOSCOPY;  Service: Cardiovascular;  Laterality: N/A;  . CATARACT EXTRACTION W/ INTRAOCULAR LENS  IMPLANT, BILATERAL Bilateral 04/2013-05/2013  . ESOPHAGOGASTRODUODENOSCOPY  11/08/2007    Normal esophagus without evidence of Barrett, mass, erosion/ Normal stomach, duodenal bulb  . GASTRIC MOTILITY STUDY  11/13/2007   mildly delayed emptying subjectively, but normal  analysis-77% of tracer emptied at 2 hours  . JOINT REPLACEMENT    . LAPAROSCOPIC APPENDECTOMY N/A 09/15/2012   Procedure: APPENDECTOMY LAPAROSCOPIC;  Surgeon: [name], MD;  Location: [facility] ORS;  Service: General;  Laterality: N/A;  . LEFT HEART CATHETERIZATION WITH CORONARY ANGIOGRAM N/A 01/05/2014   Procedure: LEFT HEART CATHETERIZATION WITH CORONARY ANGIOGRAM;  Surgeon: [name], MD;  Location: [facility] CATH LAB;  Service: Cardiovascular;  Laterality: N/A;  . REPLACEMENT TOTAL KNEE Right 05-03-06  . TEE WITHOUT CARDIOVERSION N/A 03/17/2014   Procedure: TRANSESOPHAGEAL ECHOCARDIOGRAM (TEE);  Surgeon: [name], MD;  Location: [facility];  Service: Cardiovascular;  Laterality: N/A;  . TONSILLECTOMY  1972  reports that she has never smoked. She has never used smokeless tobacco. She reports that she does not drink alcohol or use drugs.  Allergies  Allergen Reactions  . Penicillins Hives    Has patient had a PCN reaction causing immediate rash,  facial/tongue/throat swelling, SOB or lightheadedness with hypotension: no Has patient had a PCN reaction causing severe rash involving mucus membranes or skin necrosis: No  Has patient had a PCN reaction that required hospitalization: no Has patient had a PCN reaction occurring within the last 10 years: no If all of the above answers are "NO", then may proceed with Cephalosporin use.     Family History  Problem Relation Age of Onset  . Hyperlipidemia Mother   . Hypertension Mother   . Heart attack Mother   . Cancer Father        lung  . Hypertension Sister   . Diabetes Brother   . Heart disease Brother   . Hyperlipidemia Brother   . Hypertension Brother   . Heart attack Brother   . Other Brother        DVT     Prior to Admission medications   Medication Sig Start Date End Date Taking? Authorizing Provider  ACCU-CHEK AVIVA PLUS test strip  03/03/16  Yes [provider]  ACCU-CHEK SOFTCLIX LANCETS lancets  03/03/16  Yes [provider]  amitriptyline (ELAVIL) 25 MG tablet Take 25 mg by mouth at bedtime.  10/17/12  Yes Rolland Porter, MD  amLODipine (NORVASC) 10 MG tablet Take 5 mg by mouth daily.  02/25/16  Yes [provider]  aspirin 81 MG chewable tablet Chew 81 mg by mouth daily.   Yes [provider]  atenolol (TENORMIN) 25 MG tablet Take 0.5 tablets (12.5 mg total) by mouth daily. 11/24/16  Yes Hilty, Nadean Corwin, MD  atorvastatin (LIPITOR) 80 MG tablet Take 1 tablet (80 mg total) by mouth every evening. 11/24/16  Yes Hilty, Nadean Corwin, MD  Cholecalciferol (VITAMIN D3) 10000 units TABS Take 1 tablet by mouth daily.   Yes [provider]  clopidogrel (PLAVIX) 75 MG tablet Take 75 mg by mouth daily.   Yes [provider]  clotrimazole-betamethasone (LOTRISONE) cream Apply 1 application topically 3 (three) times daily.  01/13/14  Yes [provider]  colesevelam (WELCHOL) 625 MG tablet Take 1,875 mg by mouth 2 (two) times daily with  a meal. *May take 6 tablets once a day with meals*   Yes [provider]  furosemide (LASIX) 40 MG tablet Take 1 tablet (40 mg total) by mouth every morning. 01/07/14  Yes Dunn, Dayna N, PA-C  glimepiride (AMARYL) 2 MG tablet Take 9 mg by mouth daily with breakfast.    Yes [provider]  HUMALOG 100 UNIT/ML injection Inject 1-10 Units into the skin 3 (three) times daily with meals.  02/25/16  Yes [provider]  insulin glargine (LANTUS) 100 UNIT/ML injection Inject 37 Units into the skin at bedtime.    Yes [provider]  lisinopril (PRINIVIL,ZESTRIL) 20 MG tablet Take 20 mg by mouth daily.   Yes [provider]  nitroGLYCERIN (NITROSTAT) 0.4 MG SL tablet Place 1 tablet (0.4 mg total) under the tongue every 5 (five) minutes as needed for chest pain (up to 3 doses). 11/24/16  Yes Hilty, Nadean Corwin, MD  tiZANidine (ZANAFLEX) 4 MG tablet Take 4 mg by mouth daily. 12/09/15  Yes [provider]  apixaban (ELIQUIS) 5 MG TABS tablet Take 1 tablet (5  mg total) by mouth 2 (two) times daily. 11/24/16   Hilty, Nadean Corwin, MD  diazepam (VALIUM) 2 MG tablet Take 1 tablet (2 mg total) by mouth 2 (two) times daily. 09/24/15   Rancour, Annie Main, MD  meclizine (ANTIVERT) 12.5 MG tablet Take 1 tablet (12.5 mg total) by mouth 3 (three) times daily as needed for dizziness. Patient not taking: Reported on 01/19/2017 09/24/15   Ezequiel Essex, MD    Physical Exam: Vitals:   01/19/17 1138 01/19/17 1200 01/19/17 1230 01/19/17 1300  BP: (!) 151/63 (!) 164/64 (!) 155/59 (!) 142/53  Pulse: 61 62 60 (!) 53  Resp:      Temp:      TempSrc:      SpO2: 98% 99% 99% 99%  Weight:      Height:        Constitutional: NAD, calm, comfortable Vitals:   01/19/17 1138 01/19/17 1200 01/19/17 1230 01/19/17 1300  BP: (!) 151/63 (!) 164/64 (!) 155/59 (!) 142/53  Pulse: 61 62 60 (!) 53  Resp:      Temp:      TempSrc:      SpO2: 98% 99% 99% 99%  Weight:      Height:       Eyes:  PERRL, lids and conjunctivae normal ENMT: Mucous membranes are moist. Posterior pharynx clear of any exudate or lesions.Normal dentition.  Neck: normal, supple, no masses, no thyromegaly Respiratory: clear to auscultation bilaterally, no wheezing, no crackles. Normal respiratory effort. No accessory muscle use.  Cardiovascular: Regular rate and rhythm, no murmurs / rubs / gallops. 1+ extremity edema. 2+ pedal pulses. No carotid bruits.  Abdomen: no tenderness, no masses palpated. No hepatosplenomegaly. Bowel sounds positive.  Musculoskeletal: no clubbing / cyanosis. No joint deformity upper and lower extremities. Good ROM, no contractures. Normal muscle tone.  Skin: no rashes, lesions, ulcers. No induration Neurologic: CN 2-12 grossly intact. Sensation is diminished in bilateral lower extremities (chronic), Strength 5/5 in RLE, 0-1/5 in LLE, 5/5 in bilateral upper extremities.  Psychiatric: Normal judgment and insight. Alert and oriented x 3. Normal mood.     Labs on Admission: I have personally reviewed following labs and imaging studies  CBC:  Recent Labs Lab 01/19/17 0919  WBC 4.2  NEUTROABS 3.2  HGB 12.8  HCT 36.7  MCV 78.8  PLT 627   Basic Metabolic Panel:  Recent Labs Lab 01/19/17 0919  NA 135  K 4.9  CL 101  CO2 24  GLUCOSE 614*  BUN 28*  CREATININE 1.73*  CALCIUM 10.0   GFR: Estimated Creatinine Clearance: 33.6 mL/min (A) (by C-G formula based on SCr of 1.73 mg/dL (H)). Liver Function Tests:  Recent Labs Lab 01/19/17 0919  AST 69*  ALT 77*  ALKPHOS 121  BILITOT 0.4  PROT 7.3  ALBUMIN 3.7    Recent Labs Lab 01/19/17 0919  LIPASE 17   No results for input(s): AMMONIA in the last 168 hours. Coagulation Profile: No results for input(s): INR, PROTIME in the last 168 hours. Cardiac Enzymes:  Recent Labs Lab 01/19/17 0919  TROPONINI <0.03   BNP (last 3 results) No results for input(s): PROBNP in the last 8760 hours. HbA1C: No results for  input(s): HGBA1C in the last 72 hours. CBG:  Recent Labs Lab 01/19/17 0846 01/19/17 1120 01/19/17 1238  GLUCAP 585* 503* 476*   Lipid Profile: No results for input(s): CHOL, HDL, LDLCALC, TRIG, CHOLHDL, LDLDIRECT in the last 72 hours. Thyroid Function Tests: No results for input(s): TSH,  T4TOTAL, FREET4, T3FREE, THYROIDAB in the last 72 hours. Anemia Panel: No results for input(s): VITAMINB12, FOLATE, FERRITIN, TIBC, IRON, RETICCTPCT in the last 72 hours. Urine analysis:    Component Value Date/Time   COLORURINE STRAW (A) 01/19/2017 0850   APPEARANCEUR CLEAR 01/19/2017 0850   LABSPEC 1.014 01/19/2017 0850   PHURINE 6.0 01/19/2017 0850   GLUCOSEU >=500 (A) 01/19/2017 0850   HGBUR SMALL (A) 01/19/2017 0850   BILIRUBINUR NEGATIVE 01/19/2017 0850   KETONESUR 5 (A) 01/19/2017 0850   PROTEINUR 30 (A) 01/19/2017 0850   UROBILINOGEN 0.2 09/15/2012 1010   NITRITE NEGATIVE 01/19/2017 0850   LEUKOCYTESUR LARGE (A) 01/19/2017 0850    Radiological Exams on Admission: US Abdomen Complete  Result Date: 01/19/2017 CLINICAL DATA:  Nausea and vomiting, elevated LFTs. EXAM: ABDOMEN ULTRASOUND COMPLETE COMPARISON:  CT abdomen pelvis dated September 15, 2012. FINDINGS: Gallbladder: No gallstones or wall thickening visualized. No sonographic Murphy sign noted by sonographer. Common bile duct: Diameter: 2.7 mm, normal. Liver: No focal lesion identified. Within normal limits in parenchymal echogenicity. IVC: No abnormality visualized. Pancreas: Visualized portion unremarkable. Spleen: Size and appearance within normal limits. Right Kidney: Length: 9.7 cm. Echogenicity within normal limits. No mass or hydronephrosis visualized. Left Kidney: Length: 9.0 cm. Echogenicity within normal limits. No mass or hydronephrosis visualized. Abdominal aorta: No aneurysm visualized. Other findings: None. IMPRESSION: Normal abdominal ultrasound. Electronically Signed   By: Titus Dubin M.D.   On: 01/19/2017 11:24   Dg  Abd Acute W/chest  Result Date: 01/19/2017 CLINICAL DATA:  Nausea and vomiting. EXAM: DG ABDOMEN ACUTE W/ 1V CHEST COMPARISON:  01/03/2014. FINDINGS: Heart size stable. Mild bilateral interstitial prominence noted. Pneumonitis cannot be excluded. Soft tissues of the abdomen are unremarkable. Stool noted throughout the colon. No free air. Pelvic calcifications consistent with fibroids. Thoracolumbar spine scoliosis. IMPRESSION: 1. Diffuse mild bilateral interstitial prominence consistent with pneumonitis. 2. No acute intra-abdominal abnormality. Stool noted throughout the colon. 3. Uterine fibroids. Electronically Signed   By: Marcello Moores  Register   On: 01/19/2017 10:43    EKG: Independently reviewed. Sinus rhythm without acute changes  Assessment/Plan Active Problems:   Essential hypertension   CAD (coronary artery disease)   PAF (paroxysmal atrial fibrillation) ([facility])   Dyslipidemia   Diabetic hyperosmolar non-ketotic state ([place])   AKI (acute kidney injury) ([place])   Left leg weakness    1. Diabetic hyperosmolar nonketotic state. Patient has been started on insulin infusion. We'll continue to monitor blood sugars closely. Continue on IV fluids. With blood sugars generally range, she'll be transitioned back to her basal dose of Lantus. 2. Acute left leg weakness. Concern for underlying CVA. Code stroke has been activated. CT head did not show any acute findings. Unlikely she is a candidate for TPA since she is already on a chronic anticoagulation. Discussed with tele neurology who recommended inpatient admission for further stroke workup. Agreed that she was not candidate for TPA. 3. Paroxysmal atrial fibrillation. Currently in sinus rhythm. Anticoagulated with eliquis. Hold beta blockers in order to allow for permissive hypertension. 4. Coronary artery disease. Stents in the past. She had completed a year of aspirin and Plavix. Currently on baby aspirin. Follows with Dr. Debara Pickett. No complaints of chest  pain at this time.  5. Acute kidney injury. Likely related to dehydration in setting of hyperglycemia. Continue IV hydration, hold Lasix and repeat labs in the morning 6. Dyslipidemia. Continue on statin 7. Hypertension. Antihypertensives currently on hold to allow for permissive hypertension. 8. Uncontrolled diabetes. Review of  records indicate that she is a brittle diabetic. Hold oral agents as well as Lantus at this time. She is on an insulin infusion as noted above. Transition back to her home regimen as her condition improves.  DVT prophylaxis: eliquis Code Status: full code Family Communication: no family present Disposition Plan: discharge home once improved Consults called:  Admission status: inpatient, stepdown   MEMON,JEHANZEB MD [facility] Pager [phone]  If 7PM-7AM, please contact night-coverage [email or web address] Password [email or web address]  01/19/2017, 2:05 PM

## 2017-01-19 NOTE — ED Notes (Signed)
rerturned from ct at 1400. Lab in room

## 2017-01-19 NOTE — Progress Notes (Signed)
Called [facility]  Exam Finished [id number] Images sent to [facility] [id number] Exam completed in [facility] 1400

## 2017-01-19 NOTE — ED Notes (Signed)
Patient transported to Ultrasound 

## 2017-01-19 NOTE — ED Triage Notes (Addendum)
Pt brought in by [facility] from home with c/o generalized weakness, generalized body aches and vomiting x 4 that started last night. Pt's CBG was found to be 586, BP 172/96 on EMS arrival. Pt has not taken any of her medication this morning. Pt reports she only took 15 of her 37 units of Lantus last night because she was "swimmy headed" and couldn't see the numbers very good and she didn't want to take more than her prescribed dose by accident.

## 2017-01-19 NOTE — ED Notes (Signed)
Pt still in radiology.

## 2017-01-19 NOTE — ED Notes (Signed)
Date and time results received: 01/19/17 1004 (use smartphrase ".now" to insert current time)  Test: Glucose Critical Value: 614 Name of Provider Notified: Thurnell Garbe  Orders Received? Or Actions Taken?: see chart

## 2017-01-19 NOTE — Progress Notes (Signed)
*  PRELIMINARY RESULTS* Echocardiogram 2D Echocardiogram has been performed.  Samuel Germany 01/19/2017, 3:52 PM

## 2017-01-19 NOTE — ED Notes (Signed)
Call for report (x2) RN transferring another pt -will have to call back

## 2017-01-19 NOTE — ED Notes (Signed)
Dr Tonye Becket as well as hospitalist notified that pt cannot move her left leg

## 2017-01-19 NOTE — ED Notes (Signed)
Dr Albertine Patricia is neurologist - he reprots will speak with the hospitalist and get back

## 2017-01-19 NOTE — ED Notes (Signed)
Report to Erica, RN

## 2017-01-20 ENCOUNTER — Inpatient Hospital Stay (HOSPITAL_COMMUNITY): Payer: Medicare Other

## 2017-01-20 DIAGNOSIS — I6521 Occlusion and stenosis of right carotid artery: Secondary | ICD-10-CM | POA: Diagnosis present

## 2017-01-20 DIAGNOSIS — G459 Transient cerebral ischemic attack, unspecified: Secondary | ICD-10-CM | POA: Diagnosis present

## 2017-01-20 DIAGNOSIS — R531 Weakness: Secondary | ICD-10-CM

## 2017-01-20 DIAGNOSIS — G451 Carotid artery syndrome (hemispheric): Secondary | ICD-10-CM

## 2017-01-20 LAB — BASIC METABOLIC PANEL
ANION GAP: 6 (ref 5–15)
BUN: 17 mg/dL (ref 6–20)
CO2: 24 mmol/L (ref 22–32)
Calcium: 9.6 mg/dL (ref 8.9–10.3)
Chloride: 109 mmol/L (ref 101–111)
Creatinine, Ser: 1.22 mg/dL — ABNORMAL HIGH (ref 0.44–1.00)
GFR, EST AFRICAN AMERICAN: 50 mL/min — AB (ref >60)
GFR, EST NON AFRICAN AMERICAN: 43 mL/min — AB (ref >60)
Glucose, Bld: 231 mg/dL — ABNORMAL HIGH (ref 65–99)
POTASSIUM: 4.3 mmol/L (ref 3.5–5.1)
SODIUM: 139 mmol/L (ref 135–145)

## 2017-01-20 LAB — CBC
HEMATOCRIT: 33.9 % — AB (ref 36.0–46.0)
HEMOGLOBIN: 12 g/dL (ref 12.0–15.0)
MCH: 27.8 pg (ref 26.0–34.0)
MCHC: 35.4 g/dL (ref 30.0–36.0)
MCV: 78.5 fL (ref 78.0–100.0)
Platelets: 151 10*3/uL (ref 150–400)
RBC: 4.32 MIL/uL (ref 3.87–5.11)
RDW: 14.6 % (ref 11.5–15.5)
WBC: 4.9 10*3/uL (ref 4.0–10.5)

## 2017-01-20 LAB — GLUCOSE, CAPILLARY
GLUCOSE-CAPILLARY: 198 mg/dL — AB (ref 65–99)
GLUCOSE-CAPILLARY: 201 mg/dL — AB (ref 65–99)
GLUCOSE-CAPILLARY: 253 mg/dL — AB (ref 65–99)
Glucose-Capillary: 312 mg/dL — ABNORMAL HIGH (ref 65–99)
Glucose-Capillary: 210 mg/dL — ABNORMAL HIGH (ref 65–99)
Glucose-Capillary: 169 mg/dL — ABNORMAL HIGH (ref 65–99)

## 2017-01-20 LAB — URINE CULTURE: Culture: <10000 — AB

## 2017-01-20 LAB — LIPID PANEL
Cholesterol: 102 mg/dL (ref 0–200)
HDL: 47 mg/dL (ref >40)
LDL CALC: 44 mg/dL (ref 0–99)
TRIGLYCERIDES: 54 mg/dL (ref <150)
Total CHOL/HDL Ratio: 2.2 RATIO
VLDL: 11 mg/dL (ref 0–40)

## 2017-01-20 MED ORDER — SODIUM CHLORIDE 0.9 % IV SOLN
INTRAVENOUS | Status: DC
  Administered 2017-01-20: via INTRAVENOUS
  Administered 2017-01-20: 1 mL via INTRAVENOUS

## 2017-01-20 MED ORDER — INSULIN GLARGINE 100 UNIT/ML ~~LOC~~ SOLN
25.0000 [IU] | SUBCUTANEOUS | Status: AC
  Administered 2017-01-20: 25 [IU] via SUBCUTANEOUS
  Filled 2017-01-20: qty 0.25

## 2017-01-20 MED ORDER — AMLODIPINE BESYLATE 5 MG PO TABS
5.0000 mg | ORAL_TABLET | Freq: Every day | ORAL | Status: DC
  Administered 2017-01-20 – 2017-01-21 (×2): 5 mg via ORAL
  Filled 2017-01-20 (×2): qty 1

## 2017-01-20 MED ORDER — INSULIN GLARGINE 100 UNIT/ML ~~LOC~~ SOLN
SUBCUTANEOUS | Status: AC
  Filled 2017-01-20: qty 10

## 2017-01-20 MED ORDER — LISINOPRIL 10 MG PO TABS
20.0000 mg | ORAL_TABLET | Freq: Every day | ORAL | Status: DC
  Administered 2017-01-20 – 2017-01-21 (×2): 20 mg via ORAL
  Filled 2017-01-20 (×2): qty 2

## 2017-01-20 MED ORDER — INSULIN GLARGINE 100 UNIT/ML ~~LOC~~ SOLN
35.0000 [IU] | Freq: Every day | SUBCUTANEOUS | Status: DC
  Administered 2017-01-20 – 2017-01-21 (×2): 35 [IU] via SUBCUTANEOUS
  Filled 2017-01-20 (×4): qty 0.35

## 2017-01-20 MED ORDER — INSULIN ASPART 100 UNIT/ML ~~LOC~~ SOLN
0.0000 [IU] | SUBCUTANEOUS | Status: DC
  Administered 2017-01-20: 3 [IU] via SUBCUTANEOUS
  Administered 2017-01-20: 11 [IU] via SUBCUTANEOUS
  Administered 2017-01-20: 5 [IU] via SUBCUTANEOUS

## 2017-01-20 MED ORDER — IOPAMIDOL (ISOVUE-370) INJECTION 76%
100.0000 mL | Freq: Once | INTRAVENOUS | Status: AC | PRN
  Administered 2017-01-20: 100 mL via INTRAVENOUS

## 2017-01-20 MED ORDER — INSULIN ASPART 100 UNIT/ML ~~LOC~~ SOLN
0.0000 [IU] | Freq: Three times a day (TID) | SUBCUTANEOUS | Status: DC
  Administered 2017-01-21: 3 [IU] via SUBCUTANEOUS
  Administered 2017-01-21: 7 [IU] via SUBCUTANEOUS

## 2017-01-20 MED ORDER — INSULIN ASPART 100 UNIT/ML ~~LOC~~ SOLN: 0.0000 [IU] | Freq: Every day | SUBCUTANEOUS | Status: DC

## 2017-01-20 NOTE — Progress Notes (Signed)
PROGRESS NOTE    Cassandra Adams  WUJ:[id number] DOB: [birth year] DOA: 01/19/2017 PCP: Redmond School, MD    Brief Narrative:  71 year old female with a history of hypertension, diabetes, paroxysmal atrial fibrillation on anticoagulation, admitted to the hospital with generalized weakness. Found to have severe hyperglycemia with a serum glucose of 614. She was admitted for blood sugar control and hydration. Hospital stay was complicated by transient left lower extremity weakness. She has undergone stroke evaluation. MRI was negative for acute infarct. It is possible that she has a transient ischemic attack. Overall weakness has resolved. Workup has revealed right-sided carotid stenosis. She is undergoing further workup with CTA of head and neck. Anticipate discharge home in the next 24 hours if blood sugars remain stable.   Assessment & Plan:   Active Problems:   Essential hypertension   CAD (coronary artery disease)   PAF (paroxysmal atrial fibrillation) (HCC)   Dyslipidemia   Diabetic hyperosmolar non-ketotic state ([place])   AKI (acute kidney injury) ([place])   Left leg weakness   TIA (transient ischemic attack)   Carotid stenosis, right   1. Diabetic hyperosmolar nonketotic state. Was treated with insulin infusion and IV fluids. Blood sugars improved into goal range and she was transitioned back to subcutaneous insulin. Blood sugars have been stable. Continue to monitor. 2. Acute left leg weakness. Likely TIA. CT head did not show any acute findings. Not a candidate for TPA since she is already on chronic anticoagulation. Discussed with tele neurology who recommended inpatient admission for further stroke workup. MRI/MRA head were unremarkable. Echocardiogram was also unrevealing. Carotid Dopplers did show 50-69% stenosis in the right internal carotid artery. Lipid panel showed LDL was at goal range. A1c in process. Physical therapy evaluation in process. Overall her left leg weakness  has improved and she is now able to ambulate. She does not complain of any back pain. 3. Right carotid stenosis. Case was reviewed with Dr. Donnetta Hutching on call for vascular surgery. Recommendations were to check CTA of head and neck. She will likely be followed up by vascular surgery as an outpatient to discuss further options including endarterectomy. 4. Paroxysmal atrial fibrillation. Currently in sinus rhythm. Anticoagulated with eliquis. Continue on beta blockers. 5. Coronary artery disease. Stents in the past. She had completed a year of aspirin and Plavix. Currently on baby aspirin. Follows with Dr. Debara Pickett. No complaints of chest pain at this time.  6. Acute kidney injury. Likely related to dehydration in setting of hyperglycemia. Improved with hydration. 7. Dyslipidemia. Continue on statin 8. Hypertension. Antihypertensives restarted. Blood pressures have been stable. 9. Uncontrolled diabetes. Review of records indicate that she is a brittle diabetic. She has been transitioned back to subcutaneous insulin. Continue to monitor.   DVT prophylaxis: eliquis Code Status: Full code Family Communication: No family present Disposition Plan: Discharge home once improved   Consultants:     Procedures: Echo:- Mild LVH with moderate basal septal LV hypertrophy and LVEF   55-60%. Grade 2 diastolic dysfunction. Mild left atrial   enlargement. Mild to moderate mitral annular calcification with   mild mitral regurgitation. Trivial tricuspid regurgitation. No    obvious PFO or ASD.  Antimicrobials:      Subjective: Left leg weakness is better today. She is able to ambulate. Overall generalized weakness is better.  Objective: Vitals:   01/20/17 0700 01/20/17 0800 01/20/17 0935 01/20/17 1142  BP: (!) 152/60  (!) 167/65   Pulse: (!) 49     Resp: 16  Temp:  98.3 F (36.8 C)  98.1 F (36.7 C)  TempSrc:  Oral  Oral  SpO2: 96%     Weight:      Height:        Intake/Output Summary (Last  24 hours) at 01/20/17 1755 Last data filed at 01/20/17 1500  Gross per 24 hour  Intake                0 ml  Output              150 ml  Net             -150 ml   Filed Weights   01/19/17 0838 01/19/17 1457  Weight: 92.1 kg (203 lb) 88.6 kg (195 lb 5.2 oz)    Examination:  General exam: Appears calm and comfortable  Respiratory system: Clear to auscultation. Respiratory effort normal. Cardiovascular system: S1 & S2 heard, RRR. No JVD, murmurs, rubs, gallops or clicks. No pedal edema. Gastrointestinal system: Abdomen is nondistended, soft and nontender. No organomegaly or masses felt. Normal bowel sounds heard. Central nervous system: Alert and oriented. No focal neurological deficits. Extremities: Symmetric 5 x 5 power. Skin: No rashes, lesions or ulcers Psychiatry: Judgement and insight appear normal. Mood & affect appropriate.     Data Reviewed: I have personally reviewed following labs and imaging studies  CBC:  Recent Labs Lab 01/19/17 0919 01/20/17 0609  WBC 4.2 4.9  NEUTROABS 3.2  --   HGB 12.8 12.0  HCT 36.7 33.9*  MCV 78.8 78.5  PLT 159 948   Basic Metabolic Panel:  Recent Labs Lab 01/19/17 0919 01/20/17 0609  NA 135 139  K 4.9 4.3  CL 101 109  CO2 24 24  GLUCOSE 614* 231*  BUN 28* 17  CREATININE 1.73* 1.22*  CALCIUM 10.0 9.6   GFR: Estimated Creatinine Clearance: 46.7 mL/min (A) (by C-G formula based on SCr of 1.22 mg/dL (H)). Liver Function Tests:  Recent Labs Lab 01/19/17 0919  AST 69*  ALT 77*  ALKPHOS 121  BILITOT 0.4  PROT 7.3  ALBUMIN 3.7    Recent Labs Lab 01/19/17 0919  LIPASE 17   No results for input(s): AMMONIA in the last 168 hours. Coagulation Profile:  Recent Labs Lab 01/19/17 1352  INR 1.13   Cardiac Enzymes:  Recent Labs Lab 01/19/17 0919  TROPONINI <0.03   BNP (last 3 results) No results for input(s): PROBNP in the last 8760 hours. HbA1C: No results for input(s): HGBA1C in the last 72  hours. CBG:  Recent Labs Lab 01/20/17 0026 01/20/17 0455 01/20/17 0744 01/20/17 1117 01/20/17 1627  GLUCAP 201* 253* 198* 312* 210*   Lipid Profile:  Recent Labs  01/20/17 0609  CHOL 102  HDL 47  LDLCALC 44  TRIG 54  CHOLHDL 2.2   Thyroid Function Tests: No results for input(s): TSH, T4TOTAL, FREET4, T3FREE, THYROIDAB in the last 72 hours. Anemia Panel: No results for input(s): VITAMINB12, FOLATE, FERRITIN, TIBC, IRON, RETICCTPCT in the last 72 hours. Sepsis Labs: No results for input(s): PROCALCITON, LATICACIDVEN in the last 168 hours.  Recent Results (from the past 240 hour(s))  Urine culture     Status: Abnormal   Collection Time: 01/19/17  8:50 AM  Result Value Ref Range Status   Specimen Description URINE, CLEAN CATCH  Final   Special Requests NONE  Final   Culture (A)  Final    <10,000 COLONIES/mL INSIGNIFICANT GROWTH Performed at [facility], [address] [place], [place] [address]    Report Status 01/20/2017 FINAL  Final  MRSA PCR Screening     Status: None   Collection Time: 01/19/17  1:28 PM  Result Value Ref Range Status   MRSA by PCR NEGATIVE NEGATIVE Final    Comment:        The GeneXpert MRSA Assay (FDA approved for NASAL specimens only), is one component of a comprehensive MRSA colonization surveillance program. It is not intended to diagnose MRSA infection nor to guide or monitor treatment for MRSA infections.          Radiology Studies: Mr Brain Wo Contrast  Result Date: 01/19/2017 CLINICAL DATA:  Left leg weakness EXAM: MRI HEAD WITHOUT CONTRAST MRA HEAD WITHOUT CONTRAST TECHNIQUE: Multiplanar, multiecho pulse sequences of the brain and surrounding structures were obtained without intravenous contrast. Angiographic images of the head were obtained using MRA technique without contrast. COMPARISON:  Head CT 01/19/2017 Brain MRI 02/12/2015 and 09/24/2015 FINDINGS: MRI HEAD FINDINGS Brain: The midline structures are normal. No  focal diffusion restriction to indicate acute infarct. No intraparenchymal hemorrhage. The brain parenchymal signal is normal. No mass lesion. No chronic microhemorrhage or cerebral amyloid angiopathy. No hydrocephalus, age advanced atrophy or lobar predominant volume loss. No dural abnormality or extra-axial collection. Skull and upper cervical spine: The visualized skull base, calvarium, upper cervical spine and extracranial soft tissues are normal. Sinuses/Orbits: No fluid levels or advanced mucosal thickening. No mastoid effusion. Normal orbits. MRA HEAD FINDINGS Intracranial internal carotid arteries: Normal. Anterior cerebral arteries: Normal. Middle cerebral arteries: Normal. Posterior communicating arteries: Absent bilaterally. Posterior cerebral arteries: Normal. Basilar artery: Normal. Vertebral arteries: Left dominant. Normal. Superior cerebellar arteries: Normal. Anterior inferior cerebellar arteries: Normal. Posterior inferior cerebellar arteries: Left dominant. IMPRESSION: 1. Normal MRI of the brain for age. 2. No intracranial arterial occlusion or high-grade stenosis. Electronically Signed   By: Ulyses Jarred M.D.   On: 01/19/2017 17:53   US Abdomen Complete  Result Date: 01/19/2017 CLINICAL DATA:  Nausea and vomiting, elevated LFTs. EXAM: ABDOMEN ULTRASOUND COMPLETE COMPARISON:  CT abdomen pelvis dated September 15, 2012. FINDINGS: Gallbladder: No gallstones or wall thickening visualized. No sonographic Murphy sign noted by sonographer. Common bile duct: Diameter: 2.7 mm, normal. Liver: No focal lesion identified. Within normal limits in parenchymal echogenicity. IVC: No abnormality visualized. Pancreas: Visualized portion unremarkable. Spleen: Size and appearance within normal limits. Right Kidney: Length: 9.7 cm. Echogenicity within normal limits. No mass or hydronephrosis visualized. Left Kidney: Length: 9.0 cm. Echogenicity within normal limits. No mass or hydronephrosis visualized. Abdominal  aorta: No aneurysm visualized. Other findings: None. IMPRESSION: Normal abdominal ultrasound. Electronically Signed   By: Titus Dubin M.D.   On: 01/19/2017 11:24   US Carotid Bilateral (at [facility] And [facility] Only)  Result Date: 01/19/2017 CLINICAL DATA:  71 year old female with a history of left leg weakness. Cardiovascular risk factors include hypertension, known prior stroke/TIA, known coronary artery disease, diabetes. EXAM: BILATERAL CAROTID DUPLEX ULTRASOUND TECHNIQUE: Pearline Cables scale imaging, color Doppler and duplex ultrasound were performed of bilateral carotid and vertebral arteries in the neck. COMPARISON:  10/19/2011 FINDINGS: Criteria: Quantification of carotid stenosis is based on velocity parameters that correlate the residual internal carotid diameter with NASCET-based stenosis levels, using the diameter of the distal internal carotid lumen as the denominator for stenosis measurement. The following velocity measurements were obtained: RIGHT ICA:  Systolic 735 cm/sec, Diastolic 24 cm/sec CCA:  86 cm/sec SYSTOLIC ICA/CCA RATIO:  3.29 ECA:  188 cm/sec LEFT ICA:  Systolic 90  cm/sec, Diastolic 21 cm/sec CCA:  90 cm/sec SYSTOLIC ICA/CCA RATIO:  2.77 ECA:  103 cm/sec Right Brachial SBP: Not acquired Left Brachial SBP: Not acquired RIGHT CAROTID ARTERY: No significant calcified disease of the right common carotid artery. Intermediate waveform maintained. Heterogeneous plaque without significant calcifications at the right carotid bifurcation. Low resistance waveform of the right ICA. No significant tortuosity. RIGHT VERTEBRAL ARTERY:  No identified flow of the vertebral artery LEFT CAROTID ARTERY: No significant calcified disease of the left common carotid artery. Intermediate waveform maintained. Heterogeneous plaque at the left carotid bifurcation without significant calcifications. Low resistance waveform of the left ICA. LEFT VERTEBRAL ARTERY:  Antegrade flow with low resistance waveform. IMPRESSION: Right:  Heterogeneous plaque at the right carotid bifurcation contributing to 50% - 69% stenosis by established duplex criteria. Left: Color duplex indicates moderate heterogeneous plaque with no hemodynamically significant stenosis by duplex criteria in the extracranial cerebrovascular circulation. No flow identified within the right vertebral artery. Signed, Dulcy Fanny. Earleen Newport, DO [facility] Radiology Electronically Signed   By: Corrie Mckusick D.O.   On: 01/19/2017 16:22   Dg Abd Acute W/chest  Result Date: 01/19/2017 CLINICAL DATA:  Nausea and vomiting. EXAM: DG ABDOMEN ACUTE W/ 1V CHEST COMPARISON:  01/03/2014. FINDINGS: Heart size stable. Mild bilateral interstitial prominence noted. Pneumonitis cannot be excluded. Soft tissues of the abdomen are unremarkable. Stool noted throughout the colon. No free air. Pelvic calcifications consistent with fibroids. Thoracolumbar spine scoliosis. IMPRESSION: 1. Diffuse mild bilateral interstitial prominence consistent with pneumonitis. 2. No acute intra-abdominal abnormality. Stool noted throughout the colon. 3. Uterine fibroids. Electronically Signed   By: Marcello Moores  Register   On: 01/19/2017 10:43   Mr Jodene Nam Head/brain Wo Cm  Result Date: 01/19/2017 CLINICAL DATA:  Left leg weakness EXAM: MRI HEAD WITHOUT CONTRAST MRA HEAD WITHOUT CONTRAST TECHNIQUE: Multiplanar, multiecho pulse sequences of the brain and surrounding structures were obtained without intravenous contrast. Angiographic images of the head were obtained using MRA technique without contrast. COMPARISON:  Head CT 01/19/2017 Brain MRI 02/12/2015 and 09/24/2015 FINDINGS: MRI HEAD FINDINGS Brain: The midline structures are normal. No focal diffusion restriction to indicate acute infarct. No intraparenchymal hemorrhage. The brain parenchymal signal is normal. No mass lesion. No chronic microhemorrhage or cerebral amyloid angiopathy. No hydrocephalus, age advanced atrophy or  lobar predominant volume loss. No dural abnormality or extra-axial collection. Skull and upper cervical spine: The visualized skull base, calvarium, upper cervical spine and extracranial soft tissues are normal. Sinuses/Orbits: No fluid levels or advanced mucosal thickening. No mastoid effusion. Normal orbits. MRA HEAD FINDINGS Intracranial internal carotid arteries: Normal. Anterior cerebral arteries: Normal. Middle cerebral arteries: Normal. Posterior communicating arteries: Absent bilaterally. Posterior cerebral arteries: Normal. Basilar artery: Normal. Vertebral arteries: Left dominant. Normal. Superior cerebellar arteries: Normal. Anterior inferior cerebellar arteries: Normal. Posterior inferior cerebellar arteries: Left dominant. IMPRESSION: 1. Normal MRI of the brain for age. 2. No intracranial arterial occlusion or high-grade stenosis. Electronically Signed   By: Ulyses Jarred M.D.   On: 01/19/2017 17:53   Dg Hip Unilat With Pelvis 2-3 Views Left  Result Date: 01/19/2017 CLINICAL DATA:  Left-sided hip pain for 1 day, no known injury, initial encounter EXAM: DG HIP (WITH OR WITHOUT PELVIS) 2-3V LEFT COMPARISON:  None. FINDINGS: The pelvic ring is intact. Uterine fibroid calcification is noted. Degenerative changes of lumbar spine are seen. Mild degenerative changes of the hip joints are seen as well. IMPRESSION: Mild degenerative change without acute abnormality. Electronically Signed   By: Elta Guadeloupe  Lukens M.D.   On: 01/19/2017 21:03   Ct Head Code Stroke W/o Cm  Result Date: 01/19/2017 CLINICAL DATA:  Code stroke.  Acute onset of left leg weakness. EXAM: CT HEAD WITHOUT CONTRAST TECHNIQUE: Contiguous axial images were obtained from the base of the skull through the vertex without intravenous contrast. COMPARISON:  CT head without contrast 03/22/2016. FINDINGS: Brain: No acute infarct, hemorrhage, or mass lesion is present. The basal ganglia and insular ribbon are normal. No significant extra-axial fluid  collection is present. Ventricles are of normal size. Vascular: No hyperdense vessel or unexpected calcification. Skull: Calvarium is intact. Sinuses/Orbits: Chronic right maxillary sinus disease is present. The paranasal sinuses and mastoid air cells are otherwise clear. ASPECTS [facility] Stroke Program Early CT Score) - Ganglionic level infarction (caudate, lentiform nuclei, internal capsule, insula, M1-M3 cortex): 7/7 - Supraganglionic infarction (M4-M6 cortex): 3/3 Total score (0-10 with 10 being normal): 10/10 IMPRESSION: 1. Negative CT of the head. 2. ASPECTS is 10/10 These results were called by telephone at the time of interpretation on 01/19/2017 at 2:10 pm to Dr. Thurnell Garbe, who verbally acknowledged these results. Electronically Signed   By: San Morelle M.D.   On: 01/19/2017 14:10        Scheduled Meds: .  stroke: mapping our early stages of recovery book   Does not apply Once  . amitriptyline  25 mg Oral QHS  . amLODipine  5 mg Oral Daily  . apixaban  5 mg Oral BID  . aspirin  81 mg Oral Daily  . atorvastatin  80 mg Oral QPM  . colesevelam  1,875 mg Oral BID WC  . diazepam  2 mg Oral BID  . [START ON 01/21/2017] insulin aspart  0-20 Units Subcutaneous TID WC  . insulin aspart  0-5 Units Subcutaneous QHS  . insulin glargine  35 Units Subcutaneous Daily  . lisinopril  20 mg Oral Daily  . senna-docusate  2 tablet Oral QHS  . tiZANidine  4 mg Oral Daily   Continuous Infusions: . sodium chloride       LOS: 1 day    Time spent: 16mins    Trejuan Matherne, MD [facility] Pager [phone]  If 7PM-7AM, please contact night-coverage [email or web address] Password [facility] 01/20/2017, 5:55 PM

## 2017-01-21 LAB — GLUCOSE, CAPILLARY
Glucose-Capillary: 128 mg/dL — ABNORMAL HIGH (ref 65–99)
Glucose-Capillary: 203 mg/dL — ABNORMAL HIGH (ref 65–99)

## 2017-01-21 LAB — BASIC METABOLIC PANEL
ANION GAP: 6 (ref 5–15)
BUN: 17 mg/dL (ref 6–20)
CALCIUM: 9.7 mg/dL (ref 8.9–10.3)
CHLORIDE: 111 mmol/L (ref 101–111)
CO2: 24 mmol/L (ref 22–32)
Creatinine, Ser: 1.1 mg/dL — ABNORMAL HIGH (ref 0.44–1.00)
GFR calc Af Amer: 57 mL/min — ABNORMAL LOW (ref >60)
GFR calc non Af Amer: 49 mL/min — ABNORMAL LOW (ref >60)
GLUCOSE: 139 mg/dL — AB (ref 65–99)
POTASSIUM: 3.9 mmol/L (ref 3.5–5.1)
Sodium: 141 mmol/L (ref 135–145)

## 2017-01-21 LAB — HEMOGLOBIN A1C
Hgb A1c MFr Bld: 9.2 % — ABNORMAL HIGH (ref 4.8–5.6)
Mean Plasma Glucose: 217 mg/dL

## 2017-01-21 NOTE — Discharge Summary (Signed)
Physician Discharge Summary  COLLINS DIMARIA XKG:[id number] DOB: 1946/03/26 DOA: 01/19/2017  PCP: Redmond School, MD  Admit date: 01/19/2017 Discharge date: 01/21/2017  Admitted From: home Disposition:  home  Recommendations for Outpatient Follow-up:  1. Follow up with PCP in 1-2 weeks 2. Please obtain BMP/CBC in one week 3. Follow up with vascular surgery in 1-2 weeks  Home Health:HHPT Equipment/Devices:  Discharge Condition: stable CODE STATUS:full  Diet recommendation: Heart Healthy / Carb Modified   Brief/Interim Summary: 71 year old female with a history of hypertension, diabetes, paroxysmal atrial fibrillation on anticoagulation, admitted to the hospital with generalized weakness. Found to have severe hyperglycemia with a serum glucose of 614. She was admitted for blood sugar control and hydration. Hospital stay was complicated by transient left lower extremity weakness. She has undergone stroke evaluation. MRI was negative for acute infarct. It is possible that she has a transient ischemic attack.  Discharge Diagnoses:  Active Problems:   Essential hypertension   CAD (coronary artery disease)   PAF (paroxysmal atrial fibrillation) (HCC)   Dyslipidemia   Diabetic hyperosmolar non-ketotic state ([place])   AKI (acute kidney injury) ([place])   Left leg weakness   TIA (transient ischemic attack)   Carotid stenosis, right  1. Diabetic hyperosmolar nonketotic state. Was treated with insulin infusion and IV fluids. Blood sugars improved into goal range and she was transitioned back to subcutaneous insulin. Blood sugars have been stable. Continue to monitor. 2. Acute left leg weakness. Likely TIA. CT head did not show any acute findings. Not a candidate for TPA since she is already on chronic anticoagulation. Discussed with tele neurology who recommended inpatient admission for further stroke workup. MRI/MRA head were unremarkable. Echocardiogram was also unrevealing. Carotid Dopplers  did show 50-69% stenosis in the right internal carotid artery. Lipid panel showed LDL was at goal range. A1c is 9.2. Physical therapy evaluation recommended HHPT. Overall her left leg weakness has improved and she is now able to ambulate. She does not complain of any back pain. 3. Right carotid stenosis. Case was reviewed with Dr. Donnetta Hutching on call for vascular surgery. Recommendations were to check CTA of head and neck. This was performed in the hospital. She will likely be followed up by vascular surgery as an outpatient to discuss further options including endarterectomy. 4. Paroxysmal atrial fibrillation. Currently in sinus rhythm. Anticoagulated with eliquis. Atenolol discontinued due to low heart rate in the 50s and 40s.. 5. Coronary artery disease. Stents in the past. She had completed a year of aspirin and Plavix. Currently on baby aspirin. Follows with Dr. Debara Pickett. No complaints of chest pain at this time.  6. Acute kidney injury. Likely related to dehydration in setting of hyperglycemia. Improved with hydration. 7. Dyslipidemia. Continue on statin 8. Hypertension. Antihypertensives restarted. Blood pressures have been stable. 9. Uncontrolled diabetes. Review of records indicate that she is a brittle diabetic. She has been transitioned back to subcutaneous insulin. Continue to monitor.   Discharge Instructions  Discharge Instructions    Diet - low sodium heart healthy    Complete by:  As directed    Increase activity slowly    Complete by:  As directed      Allergies as of 01/21/2017      Reactions   Penicillins Hives   Has patient had a PCN reaction causing immediate rash, facial/tongue/throat swelling, SOB or lightheadedness with hypotension: no Has patient had a PCN reaction causing severe rash involving mucus membranes or skin necrosis: No  Has patient had a PCN  reaction that required hospitalization: no Has patient had a PCN reaction occurring within the last 10 years: no If all of  the above answers are "NO", then may proceed with Cephalosporin use.      Medication List    STOP taking these medications   atenolol 25 MG tablet Commonly known as:  TENORMIN     TAKE these medications   ACCU-CHEK AVIVA PLUS test strip Generic drug:  glucose blood   ACCU-CHEK SOFTCLIX LANCETS lancets   amitriptyline 25 MG tablet Commonly known as:  ELAVIL Take 25 mg by mouth at bedtime.   amLODipine 10 MG tablet Commonly known as:  NORVASC Take 5 mg by mouth daily.   apixaban 5 MG Tabs tablet Commonly known as:  ELIQUIS Take 1 tablet (5 mg total) by mouth 2 (two) times daily.   aspirin 81 MG chewable tablet Chew 81 mg by mouth daily.   atorvastatin 80 MG tablet Commonly known as:  LIPITOR Take 1 tablet (80 mg total) by mouth every evening.   clotrimazole-betamethasone cream Commonly known as:  LOTRISONE Apply 1 application topically 3 (three) times daily.   colesevelam 625 MG tablet Commonly known as:  WELCHOL Take 1,875 mg by mouth 2 (two) times daily with a meal. *May take 6 tablets once a day with meals*   diazepam 2 MG tablet Commonly known as:  VALIUM Take 1 tablet (2 mg total) by mouth 2 (two) times daily.   furosemide 40 MG tablet Commonly known as:  LASIX Take 1 tablet (40 mg total) by mouth every morning.   glimepiride 2 MG tablet Commonly known as:  AMARYL Take 9 mg by mouth daily with breakfast.   HUMALOG 100 UNIT/ML injection Generic drug:  insulin lispro Inject 1-10 Units into the skin 3 (three) times daily with meals.   insulin glargine 100 UNIT/ML injection Commonly known as:  LANTUS Inject 37 Units into the skin at bedtime.   lisinopril 20 MG tablet Commonly known as:  PRINIVIL,ZESTRIL Take 20 mg by mouth daily.   meclizine 12.5 MG tablet Commonly known as:  ANTIVERT Take 1 tablet (12.5 mg total) by mouth 3 (three) times daily as needed for dizziness.   nitroGLYCERIN 0.4 MG SL tablet Commonly known as:  NITROSTAT Place 1 tablet  (0.4 mg total) under the tongue every 5 (five) minutes as needed for chest pain (up to 3 doses).   tiZANidine 4 MG tablet Commonly known as:  ZANAFLEX Take 4 mg by mouth daily.   Vitamin D3 10000 units Tabs Take 1 tablet by mouth daily.       Allergies  Allergen Reactions  . Penicillins Hives    Has patient had a PCN reaction causing immediate rash, facial/tongue/throat swelling, SOB or lightheadedness with hypotension: no Has patient had a PCN reaction causing severe rash involving mucus membranes or skin necrosis: No  Has patient had a PCN reaction that required hospitalization: no Has patient had a PCN reaction occurring within the last 10 years: no If all of the above answers are "NO", then may proceed with Cephalosporin use.     Consultations:     Procedures/Studies: Ct Angio Head W Or Wo Contrast  Result Date: 01/20/2017 CLINICAL DATA:  Weakness, hyperglycemia, transient lower extremity weakness. EXAM: CT ANGIOGRAPHY HEAD AND NECK TECHNIQUE: Multidetector CT imaging of the head and neck was performed using the standard protocol during bolus administration of intravenous contrast. Multiplanar CT image reconstructions and MIPs were obtained to evaluate the vascular anatomy. Carotid stenosis  measurements (when applicable) are obtained utilizing NASCET criteria, using the distal internal carotid diameter as the denominator. CONTRAST:  100 mL Isovue 370. COMPARISON:  MR brain 01/19/2017. MRA intracranial 01/19/2017. CT head 01/19/2017. FINDINGS: CTA NECK Aortic arch: Standard branching. Imaged portion shows no evidence of aneurysm or dissection. No significant stenosis of the major arch vessel origins. Right carotid system: Minor calcific and soft plaque plaque, non stenotic based on luminal measurements of 3.1/4.5 proximal/ distal. No evidence of dissection, or occlusion. Left carotid system: Minor calcific plaque, without luminal narrowing. No evidence of dissection, stenosis (50%  or greater) or occlusion. Vertebral arteries: LEFT vertebral dominant. Minor ostial calcifications. No evidence of dissection, stenosis (50% or greater) or occlusion. Nonvascular soft tissues: lung apices clear. No mediastinal masses. No neck masses of significance. Cervical spondylosis. Absent dentition. Chronic RIGHT maxillary sinusitis. CTA HEAD Anterior circulation: Calcification of the cavernous internal carotid arteries consistent with cerebrovascular atherosclerotic disease. No significant stenosis, proximal occlusion, aneurysm, or vascular malformation. Posterior circulation: Widely patent basilar. Both vertebrals contribute, LEFT dominant. No significant stenosis, proximal occlusion, aneurysm, or vascular malformation. Venous sinuses: As permitted by contrast timing, patent. Anatomic variants: None of significance. Delayed phase:   No abnormal intracranial enhancement. IMPRESSION: No intracranial or extracranial flow reducing stenosis or occlusion. Non stenotic atheromatous change RIGHT carotid bifurcation. Cervical spondylosis, but no features suggestive of critical spinal stenosis. If further investigation desired, consider MRI cervical spine without contrast for further evaluation, particularly if there are signs and symptoms of myelopathy. Electronically Signed   By: Staci Righter M.D.   On: 01/20/2017 21:45   Ct Angio Neck W Or Wo Contrast  Result Date: 01/20/2017 CLINICAL DATA:  Weakness, hyperglycemia, transient lower extremity weakness. EXAM: CT ANGIOGRAPHY HEAD AND NECK TECHNIQUE: Multidetector CT imaging of the head and neck was performed using the standard protocol during bolus administration of intravenous contrast. Multiplanar CT image reconstructions and MIPs were obtained to evaluate the vascular anatomy. Carotid stenosis measurements (when applicable) are obtained utilizing NASCET criteria, using the distal internal carotid diameter as the denominator. CONTRAST:  100 mL Isovue 370.  COMPARISON:  MR brain 01/19/2017. MRA intracranial 01/19/2017. CT head 01/19/2017. FINDINGS: CTA NECK Aortic arch: Standard branching. Imaged portion shows no evidence of aneurysm or dissection. No significant stenosis of the major arch vessel origins. Right carotid system: Minor calcific and soft plaque plaque, non stenotic based on luminal measurements of 3.1/4.5 proximal/ distal. No evidence of dissection, or occlusion. Left carotid system: Minor calcific plaque, without luminal narrowing. No evidence of dissection, stenosis (50% or greater) or occlusion. Vertebral arteries: LEFT vertebral dominant. Minor ostial calcifications. No evidence of dissection, stenosis (50% or greater) or occlusion. Nonvascular soft tissues: lung apices clear. No mediastinal masses. No neck masses of significance. Cervical spondylosis. Absent dentition. Chronic RIGHT maxillary sinusitis. CTA HEAD Anterior circulation: Calcification of the cavernous internal carotid arteries consistent with cerebrovascular atherosclerotic disease. No significant stenosis, proximal occlusion, aneurysm, or vascular malformation. Posterior circulation: Widely patent basilar. Both vertebrals contribute, LEFT dominant. No significant stenosis, proximal occlusion, aneurysm, or vascular malformation. Venous sinuses: As permitted by contrast timing, patent. Anatomic variants: None of significance. Delayed phase:   No abnormal intracranial enhancement. IMPRESSION: No intracranial or extracranial flow reducing stenosis or occlusion. Non stenotic atheromatous change RIGHT carotid bifurcation. Cervical spondylosis, but no features suggestive of critical spinal stenosis. If further investigation desired, consider MRI cervical spine without contrast for further evaluation, particularly if there are signs and symptoms of myelopathy. Electronically Signed   By: Jenny Reichmann  Alfonse Flavors M.D.   On: 01/20/2017 21:45   Mr Brain Wo Contrast  Result Date: 01/19/2017 CLINICAL DATA:   Left leg weakness EXAM: MRI HEAD WITHOUT CONTRAST MRA HEAD WITHOUT CONTRAST TECHNIQUE: Multiplanar, multiecho pulse sequences of the brain and surrounding structures were obtained without intravenous contrast. Angiographic images of the head were obtained using MRA technique without contrast. COMPARISON:  Head CT 01/19/2017 Brain MRI 02/12/2015 and 09/24/2015 FINDINGS: MRI HEAD FINDINGS Brain: The midline structures are normal. No focal diffusion restriction to indicate acute infarct. No intraparenchymal hemorrhage. The brain parenchymal signal is normal. No mass lesion. No chronic microhemorrhage or cerebral amyloid angiopathy. No hydrocephalus, age advanced atrophy or lobar predominant volume loss. No dural abnormality or extra-axial collection. Skull and upper cervical spine: The visualized skull base, calvarium, upper cervical spine and extracranial soft tissues are normal. Sinuses/Orbits: No fluid levels or advanced mucosal thickening. No mastoid effusion. Normal orbits. MRA HEAD FINDINGS Intracranial internal carotid arteries: Normal. Anterior cerebral arteries: Normal. Middle cerebral arteries: Normal. Posterior communicating arteries: Absent bilaterally. Posterior cerebral arteries: Normal. Basilar artery: Normal. Vertebral arteries: Left dominant. Normal. Superior cerebellar arteries: Normal. Anterior inferior cerebellar arteries: Normal. Posterior inferior cerebellar arteries: Left dominant. IMPRESSION: 1. Normal MRI of the brain for age. 2. No intracranial arterial occlusion or high-grade stenosis. Electronically Signed   By: Ulyses Jarred M.D.   On: 01/19/2017 17:53   US Abdomen Complete  Result Date: 01/19/2017 CLINICAL DATA:  Nausea and vomiting, elevated LFTs. EXAM: ABDOMEN ULTRASOUND COMPLETE COMPARISON:  CT abdomen pelvis dated September 15, 2012. FINDINGS: Gallbladder: No gallstones or wall thickening visualized. No sonographic Murphy sign noted by sonographer. Common bile duct: Diameter: 2.7 mm,  normal. Liver: No focal lesion identified. Within normal limits in parenchymal echogenicity. IVC: No abnormality visualized. Pancreas: Visualized portion unremarkable. Spleen: Size and appearance within normal limits. Right Kidney: Length: 9.7 cm. Echogenicity within normal limits. No mass or hydronephrosis visualized. Left Kidney: Length: 9.0 cm. Echogenicity within normal limits. No mass or hydronephrosis visualized. Abdominal aorta: No aneurysm visualized. Other findings: None. IMPRESSION: Normal abdominal ultrasound. Electronically Signed   By: Titus Dubin M.D.   On: 01/19/2017 11:24   US Carotid Bilateral (at [facility] And [facility] Only)  Result Date: 01/19/2017 CLINICAL DATA:  71 year old female with a history of left leg weakness. Cardiovascular risk factors include hypertension, known prior stroke/TIA, known coronary artery disease, diabetes. EXAM: BILATERAL CAROTID DUPLEX ULTRASOUND TECHNIQUE: Pearline Cables scale imaging, color Doppler and duplex ultrasound were performed of bilateral carotid and vertebral arteries in the neck. COMPARISON:  10/19/2011 FINDINGS: Criteria: Quantification of carotid stenosis is based on velocity parameters that correlate the residual internal carotid diameter with NASCET-based stenosis levels, using the diameter of the distal internal carotid lumen as the denominator for stenosis measurement. The following velocity measurements were obtained: RIGHT ICA:  Systolic 595 cm/sec, Diastolic 24 cm/sec CCA:  86 cm/sec SYSTOLIC ICA/CCA RATIO:  6.38 ECA:  188 cm/sec LEFT ICA:  Systolic 90 cm/sec, Diastolic 21 cm/sec CCA:  90 cm/sec SYSTOLIC ICA/CCA RATIO:  7.56 ECA:  103 cm/sec Right Brachial SBP: Not acquired Left Brachial SBP: Not acquired RIGHT CAROTID ARTERY: No significant calcified disease of the right common carotid artery. Intermediate waveform maintained. Heterogeneous plaque without significant calcifications at the right carotid bifurcation. Low resistance waveform of the right ICA. No  significant tortuosity. RIGHT VERTEBRAL ARTERY:  No identified flow of the vertebral artery LEFT CAROTID ARTERY: No significant calcified disease of the left common carotid artery. Intermediate waveform maintained. Heterogeneous plaque at  the left carotid bifurcation without significant calcifications. Low resistance waveform of the left ICA. LEFT VERTEBRAL ARTERY:  Antegrade flow with low resistance waveform. IMPRESSION: Right: Heterogeneous plaque at the right carotid bifurcation contributing to 50% - 69% stenosis by established duplex criteria. Left: Color duplex indicates moderate heterogeneous plaque with no hemodynamically significant stenosis by duplex criteria in the extracranial cerebrovascular circulation. No flow identified within the right vertebral artery. Signed, Dulcy Fanny. Earleen Newport, DO [facility] Electronically Signed   By: Corrie Mckusick D.O.   On: 01/19/2017 16:22   Dg Abd Acute W/chest  Result Date: 01/19/2017 CLINICAL DATA:  Nausea and vomiting. EXAM: DG ABDOMEN ACUTE W/ 1V CHEST COMPARISON:  01/03/2014. FINDINGS: Heart size stable. Mild bilateral interstitial prominence noted. Pneumonitis cannot be excluded. Soft tissues of the abdomen are unremarkable. Stool noted throughout the colon. No free air. Pelvic calcifications consistent with fibroids. Thoracolumbar spine scoliosis. IMPRESSION: 1. Diffuse mild bilateral interstitial prominence consistent with pneumonitis. 2. No acute intra-abdominal abnormality. Stool noted throughout the colon. 3. Uterine fibroids. Electronically Signed   By: Marcello Moores  Register   On: 01/19/2017 10:43   Mr Jodene Nam Head/brain Wo Cm  Result Date: 01/19/2017 CLINICAL DATA:  Left leg weakness EXAM: MRI HEAD WITHOUT CONTRAST MRA HEAD WITHOUT CONTRAST TECHNIQUE: Multiplanar, multiecho pulse sequences of the brain and surrounding structures were obtained without intravenous contrast. Angiographic images of the head were  obtained using MRA technique without contrast. COMPARISON:  Head CT 01/19/2017 Brain MRI 02/12/2015 and 09/24/2015 FINDINGS: MRI HEAD FINDINGS Brain: The midline structures are normal. No focal diffusion restriction to indicate acute infarct. No intraparenchymal hemorrhage. The brain parenchymal signal is normal. No mass lesion. No chronic microhemorrhage or cerebral amyloid angiopathy. No hydrocephalus, age advanced atrophy or lobar predominant volume loss. No dural abnormality or extra-axial collection. Skull and upper cervical spine: The visualized skull base, calvarium, upper cervical spine and extracranial soft tissues are normal. Sinuses/Orbits: No fluid levels or advanced mucosal thickening. No mastoid effusion. Normal orbits. MRA HEAD FINDINGS Intracranial internal carotid arteries: Normal. Anterior cerebral arteries: Normal. Middle cerebral arteries: Normal. Posterior communicating arteries: Absent bilaterally. Posterior cerebral arteries: Normal. Basilar artery: Normal. Vertebral arteries: Left dominant. Normal. Superior cerebellar arteries: Normal. Anterior inferior cerebellar arteries: Normal. Posterior inferior cerebellar arteries: Left dominant. IMPRESSION: 1. Normal MRI of the brain for age. 2. No intracranial arterial occlusion or high-grade stenosis. Electronically Signed   By: Ulyses Jarred M.D.   On: 01/19/2017 17:53   Dg Hip Unilat With Pelvis 2-3 Views Left  Result Date: 01/19/2017 CLINICAL DATA:  Left-sided hip pain for 1 day, no known injury, initial encounter EXAM: DG HIP (WITH OR WITHOUT PELVIS) 2-3V LEFT COMPARISON:  None. FINDINGS: The pelvic ring is intact. Uterine fibroid calcification is noted. Degenerative changes of lumbar spine are seen. Mild degenerative changes of the hip joints are seen as well. IMPRESSION: Mild degenerative change without acute abnormality. Electronically Signed   By: Inez Catalina M.D.   On: 01/19/2017 21:03   Ct Head Code Stroke W/o Cm  Result Date:  01/19/2017 CLINICAL DATA:  Code stroke.  Acute onset of left leg weakness. EXAM: CT HEAD WITHOUT CONTRAST TECHNIQUE: Contiguous axial images were obtained from the base of the skull through the vertex without intravenous contrast. COMPARISON:  CT head without contrast 03/22/2016. FINDINGS: Brain: No acute infarct, hemorrhage, or mass lesion is present. The basal ganglia and insular ribbon are normal. No significant extra-axial fluid collection is present. Ventricles are of normal size.  Vascular: No hyperdense vessel or unexpected calcification. Skull: Calvarium is intact. Sinuses/Orbits: Chronic right maxillary sinus disease is present. The paranasal sinuses and mastoid air cells are otherwise clear. ASPECTS [facility] Stroke Program Early CT Score) - Ganglionic level infarction (caudate, lentiform nuclei, internal capsule, insula, M1-M3 cortex): 7/7 - Supraganglionic infarction (M4-M6 cortex): 3/3 Total score (0-10 with 10 being normal): 10/10 IMPRESSION: 1. Negative CT of the head. 2. ASPECTS is 10/10 These results were called by telephone at the time of interpretation on 01/19/2017 at 2:10 pm to Dr. Thurnell Garbe, who verbally acknowledged these results. Electronically Signed   By: San Morelle M.D.   On: 01/19/2017 14:10    Echo:- Mild LVH with moderate basal septal LV hypertrophy and LVEF 55-60%. Grade 2 diastolic dysfunction. Mild left atrial enlargement. Mild to moderate mitral annular calcification with mild mitral regurgitation. Trivial tricuspid regurgitation. No  obvious PFO or ASD.   Subjective: Feeling better. Left leg weakness is better. No new complaints  Discharge Exam: Vitals:   01/21/17 0400 01/21/17 0800  BP: (!) 167/58 (!) 155/67  Pulse: (!) 55 (!) 59  Resp: 18 18  Temp: 97.8 F (36.6 C) 98.4 F (36.9 C)   Vitals:   01/20/17 2224 01/21/17 0000 01/21/17 0400 01/21/17 0800  BP:  (!) 148/53 (!) 167/58 (!) 155/67  Pulse:  63 (!) 55 (!) 59  Resp:  18 18 18   Temp:   98.3 F (36.8 C) 97.8 F (36.6 C) 98.4 F (36.9 C)  TempSrc:  Oral Oral Oral  SpO2: 99% 100% 99% 98%  Weight:      Height:        General: Pt is alert, awake, not in acute distress Cardiovascular: RRR, S1/S2 +, no rubs, no gallops Respiratory: CTA bilaterally, no wheezing, no rhonchi Abdominal: Soft, NT, ND, bowel sounds + Extremities: no edema, no cyanosis    The results of significant diagnostics from this hospitalization (including imaging, microbiology, ancillary and laboratory) are listed below for reference.     Microbiology: Recent Results (from the past 240 hour(s))  Urine culture     Status: Abnormal   Collection Time: 01/19/17  8:50 AM  Result Value Ref Range Status   Specimen Description URINE, CLEAN CATCH  Final   Special Requests NONE  Final   Culture (A)  Final    <10,000 COLONIES/mL INSIGNIFICANT GROWTH Performed at [facility], [address], [place], [place] [address]    Report Status 01/20/2017 FINAL  Final  MRSA PCR Screening     Status: None   Collection Time: 01/19/17  1:28 PM  Result Value Ref Range Status   MRSA by PCR NEGATIVE NEGATIVE Final    Comment:        The GeneXpert MRSA Assay (FDA approved for NASAL specimens only), is one component of a comprehensive MRSA colonization surveillance program. It is not intended to diagnose MRSA infection nor to guide or monitor treatment for MRSA infections.      Labs: BNP (last 3 results) No results for input(s): BNP in the last 8760 hours. Basic Metabolic Panel:  Recent Labs Lab 01/19/17 0919 01/20/17 0609 01/21/17 0604  NA 135 139 141  K 4.9 4.3 3.9  CL 101 109 111  CO2 24 24 24   GLUCOSE 614* 231* 139*  BUN 28* 17 17  CREATININE 1.73* 1.22* 1.10*  CALCIUM 10.0 9.6 9.7   Liver Function Tests:  Recent Labs Lab 01/19/17 0919  AST 69*  ALT 77*  ALKPHOS 121  BILITOT 0.4  PROT 7.3  ALBUMIN 3.7    Recent Labs Lab 01/19/17 0919  LIPASE 17   No results for  input(s): AMMONIA in the last 168 hours. CBC:  Recent Labs Lab 01/19/17 0919 01/20/17 0609  WBC 4.2 4.9  NEUTROABS 3.2  --   HGB 12.8 12.0  HCT 36.7 33.9*  MCV 78.8 78.5  PLT 159 151   Cardiac Enzymes:  Recent Labs Lab 01/19/17 0919  TROPONINI <0.03   BNP: Invalid input(s): POCBNP CBG:  Recent Labs Lab 01/20/17 0744 01/20/17 1117 01/20/17 1627 01/20/17 2122 01/21/17 0745  GLUCAP 198* 312* 210* 169* 128*   D-Dimer No results for input(s): DDIMER in the last 72 hours. Hgb A1c No results for input(s): HGBA1C in the last 72 hours. Lipid Profile  Recent Labs  01/20/17 0609  CHOL 102  HDL 47  LDLCALC 44  TRIG 54  CHOLHDL 2.2   Thyroid function studies No results for input(s): TSH, T4TOTAL, T3FREE, THYROIDAB in the last 72 hours.  Invalid input(s): FREET3 Anemia work up No results for input(s): VITAMINB12, FOLATE, FERRITIN, TIBC, IRON, RETICCTPCT in the last 72 hours. Urinalysis    Component Value Date/Time   COLORURINE STRAW (A) 01/19/2017 0850   APPEARANCEUR CLEAR 01/19/2017 0850   LABSPEC 1.014 01/19/2017 0850   PHURINE 6.0 01/19/2017 0850   GLUCOSEU >=500 (A) 01/19/2017 0850   HGBUR SMALL (A) 01/19/2017 0850   BILIRUBINUR NEGATIVE 01/19/2017 0850   KETONESUR 5 (A) 01/19/2017 0850   PROTEINUR 30 (A) 01/19/2017 0850   UROBILINOGEN 0.2 09/15/2012 1010   NITRITE NEGATIVE 01/19/2017 0850   LEUKOCYTESUR LARGE (A) 01/19/2017 0850   Sepsis Labs Invalid input(s): PROCALCITONIN,  WBC,  LACTICIDVEN Microbiology Recent Results (from the past 240 hour(s))  Urine culture     Status: Abnormal   Collection Time: 01/19/17  8:50 AM  Result Value Ref Range Status   Specimen Description URINE, CLEAN CATCH  Final   Special Requests NONE  Final   Culture (A)  Final    <10,000 COLONIES/mL INSIGNIFICANT GROWTH Performed at [facility], [address], [place],  [address]    Report Status 01/20/2017 FINAL  Final  MRSA PCR Screening     Status:  None   Collection Time: 01/19/17  1:28 PM  Result Value Ref Range Status   MRSA by PCR NEGATIVE NEGATIVE Final    Comment:        The GeneXpert MRSA Assay (FDA approved for NASAL specimens only), is one component of a comprehensive MRSA colonization surveillance program. It is not intended to diagnose MRSA infection nor to guide or monitor treatment for MRSA infections.      Time coordinating discharge: Over 30 minutes  SIGNED:   Kathie Dike, MD  [facility] 01/21/2017, 11:09 AM Pager   If 7PM-7AM, please contact night-coverage [email or web address] Password [email or web address]

## 2017-01-21 NOTE — Progress Notes (Signed)
Pt discharged home today per Dr. Roderic Palau. Pt's IV site D/C'd and WDL. Pt's VSS. Pt provided with home medication list, discharge instructions and prescriptions. Verbalized understanding. Pt left floor via WC in stable condition accompanied by RN.

## 2017-01-21 NOTE — Evaluation (Signed)
Physical Therapy Evaluation Patient Details Name: Cassandra Adams MRN: [id number] DOB: 1945-11-02 Today's Date: 01/21/2017   History of Present Illness  71 year old female with a history of hypertension, diabetes, paroxysmal atrial fibrillation on anticoagulation, admitted to the hospital with generalized weakness. Found to have severe hyperglycemia with a serum glucose of 614. She was admitted for blood sugar control and hydration. Hospital stay was complicated by transient left lower extremity weakness. She has undergone stroke evaluation. MRI was negative for acute infarct. It is possible that she has a transient ischemic attack. Overall weakness has resolved. Workup has revealed right-sided carotid stenosis. She is undergoing further workup with CTA of head and neck. Anticipate discharge home in the next 24 hours if blood sugars remain stable.  Clinical Impression  Patient received supine in bed with family present, pleasant and willing to participate in skilled PT services but reporting she desperately has to use the restroom. Patient able to generally complete bed mobility with independence but does require min guard for sit to stand and gait with no device inside of hospital room. She is able to complete all toileting with independence today, however does demonstrate considerable functional weakness as well as impaired balance with tandem stance testing. Based on findings of this evaluation, patient generally appears safe to return home with use of her personal assistive devices, and will benefit from [place] moving forward however she requests they do not come early in the morning. Patient left supine in bed with HOB elevated, all needs met/concerns addressed and family still in room with her.     Follow Up Recommendations Home health PT (patient requests HHPT does not come to her home early in the morning )    Equipment Recommendations  None recommended by PT    Recommendations  for Other Services       Precautions / Restrictions Precautions Precautions: Fall Restrictions Weight Bearing Restrictions: No      Mobility  Bed Mobility Overal bed mobility: Independent                Transfers Overall transfer level: Needs assistance Equipment used: None Transfers: Sit to/from Stand Sit to Stand: Min guard         General transfer comment: min guard   Ambulation/Gait Ambulation/Gait assistance: Min guard Ambulation Distance (Feet): 20 Feet Assistive device: None Gait Pattern/deviations: Decreased step length - right;Decreased step length - left;Trunk flexed;Narrow base of support        Stairs            Wheelchair Mobility    Modified Rankin (Stroke Patients Only)       Balance Overall balance assessment: Needs assistance Sitting-balance support: No upper extremity supported Sitting balance-Leahy Scale: Good     Standing balance support: No upper extremity supported Standing balance-Leahy Scale: Fair       Tandem Stance - Right Leg: 3 Tandem Stance - Left Leg: 2                     Pertinent Vitals/Pain Pain Assessment: No/denies pain    Home Living Family/patient expects to be discharged to:: Private residence Living Arrangements: Alone Available Help at Discharge: Family Type of Home: House Home Access: Level entry     Home Layout: One level Home Equipment: Environmental consultant - 2 wheels;Cane - single point      Prior Function Level of Independence: Independent               Hand Dominance  Extremity/Trunk Assessment        Lower Extremity Assessment Lower Extremity Assessment: Generalized weakness    Cervical / Trunk Assessment Cervical / Trunk Assessment: Kyphotic  Communication   Communication: No difficulties  Cognition Arousal/Alertness: Awake/alert Behavior During Therapy: WFL for tasks assessed/performed Overall Cognitive Status: Within Functional Limits for tasks assessed                                         General Comments      Exercises     Assessment/Plan    PT Assessment Patient needs continued PT services  PT Problem List Decreased strength;Decreased coordination;Decreased balance       PT Treatment Interventions Therapeutic activities;Gait training;Therapeutic exercise;Patient/family education;Stair training;Balance training;Functional mobility training;Neuromuscular re-education;Manual techniques    PT Goals (Current goals can be found in the Care Plan section)  Acute Rehab PT Goals Patient Stated Goal: to go home  PT Goal Formulation: With patient Time For Goal Achievement: 02/04/17 Potential to Achieve Goals: Good    Frequency Min 3X/week   Barriers to discharge        Co-evaluation               AM-PAC PT "6 Clicks" Daily Activity  Outcome Measure Difficulty turning over in bed (including adjusting bedclothes, sheets and blankets)?: None Difficulty moving from lying on back to sitting on the side of the bed? : None Difficulty sitting down on and standing up from a chair with arms (e.g., wheelchair, bedside commode, etc,.)?: A Little Help needed moving to and from a bed to chair (including a wheelchair)?: None Help needed walking in hospital room?: None Help needed climbing 3-5 steps with a railing? : A Little 6 Click Score: 22    End of Session   Activity Tolerance: Patient tolerated treatment well Patient left: in bed;with family/visitor present   PT Visit Diagnosis: Muscle weakness (generalized) (M62.81);Unsteadiness on feet (R26.81)    Time: 2763-9432 PT Time Calculation (min) (ACUTE ONLY): 10 min   Charges:   PT Evaluation $PT Eval Low Complexity: 1 Procedure     PT G Codes:   PT G-Codes **NOT FOR INPATIENT CLASS** Functional Assessment Tool Used: AM-PAC 6 Clicks Basic Mobility;Clinical judgement Functional Limitation: Mobility: Walking and moving around Mobility: Walking and Moving  Around Current Status (W0379): At least 20 percent but less than 40 percent impaired, limited or restricted Mobility: Walking and Moving Around Goal Status [phone]): At least 1 percent but less than 20 percent impaired, limited or restricted   Deniece Ree PT, DPT [phone]

## 2017-01-22 ENCOUNTER — Telehealth: Payer: Self-pay | Admitting: Vascular Surgery

## 2017-01-22 DIAGNOSIS — I10 Essential (primary) hypertension: Secondary | ICD-10-CM | POA: Diagnosis not present

## 2017-01-22 DIAGNOSIS — Z7982 Long term (current) use of aspirin: Secondary | ICD-10-CM | POA: Diagnosis not present

## 2017-01-22 DIAGNOSIS — Z7901 Long term (current) use of anticoagulants: Secondary | ICD-10-CM | POA: Diagnosis not present

## 2017-01-22 DIAGNOSIS — Z794 Long term (current) use of insulin: Secondary | ICD-10-CM | POA: Diagnosis not present

## 2017-01-22 DIAGNOSIS — I480 Paroxysmal atrial fibrillation: Secondary | ICD-10-CM | POA: Diagnosis not present

## 2017-01-22 DIAGNOSIS — E1165 Type 2 diabetes mellitus with hyperglycemia: Secondary | ICD-10-CM | POA: Diagnosis not present

## 2017-01-22 DIAGNOSIS — M6281 Muscle weakness (generalized): Secondary | ICD-10-CM | POA: Diagnosis not present

## 2017-01-22 NOTE — Telephone Encounter (Signed)
Sched appt 01/25/17 at 9:00 with CE[facility]. Spoke to pt, also lm on daughter's # as per pt's request.

## 2017-01-22 NOTE — Telephone Encounter (Signed)
1-2 weeks per Dr. Donnetta Hutching,  Received: Yesterday  Message Contents  McChesney, Tania Ade, RN  P [facility] Admin Pool      Previous Messages    ----- Message -----  From: Rosetta Posner, MD  Sent: 01/20/2017  5:10 PM  To: [facility] Clinical Pool, [facility] Charge Pool   Cassandra Adams January [birth year] 2045 at [facility] with TIA and moderate ICA stenosis. Needs ov with me or anyone in the next 1-2 weeks. Does not need any studies

## 2017-01-23 ENCOUNTER — Encounter: Payer: Self-pay | Admitting: Vascular Surgery

## 2017-01-25 ENCOUNTER — Ambulatory Visit (INDEPENDENT_AMBULATORY_CARE_PROVIDER_SITE_OTHER): Payer: Medicare Other | Admitting: Vascular Surgery

## 2017-01-25 ENCOUNTER — Encounter: Payer: Self-pay | Admitting: Vascular Surgery

## 2017-01-25 VITALS — BP 146/79 | HR 60 | Resp 18 | Ht 66.0 in | Wt 203.0 lb

## 2017-01-25 DIAGNOSIS — I6521 Occlusion and stenosis of right carotid artery: Secondary | ICD-10-CM

## 2017-01-25 NOTE — Progress Notes (Signed)
Referring Physician: Dr Roderic Palau  Patient name: Cassandra Adams MRN: [id number] DOB: 25-Aug-1945 Sex: female  REASON FOR CONSULT: symptomatic right internal carotid artery stenosis  HPI: Cassandra Adams is a 71 y.o. female valuation of a symptomatic right internal carotid artery stenosis. The patient was recently seen at [facility] after suffering a TIA. This affected her left arm and left leg. She had weakness in both areas. This lasted for several hours and then completely resolved. She denies any prior symptoms of TIA amaurosis or stroke but has been followed by our office in the past for mild carotid stenosis. She was last seen in 2015. She currently is on aspirin and Plavix and Eliquis. However her records states that she is only supposed to be on aspirin and Eliquis. Other medical problems include coronary artery disease, CK D3, hypertension which are been stable. She also has diabetes. She is on a statin.  She is on Eliquis for paroxysmal atrial fibrillation.  Past Medical History:  Diagnosis Date  . Arthritis    "all over"  . CAD (coronary artery disease)    a. NSTEMI 12/2013 - occluded dominant RCA with L-R collaterals, moderate prox segmental LAD disease, for medical therapy initially, EF 60% with subtle inferobasal hypokinesia. Consider PCI for refractory CP.  [name] CKD (chronic kidney disease), stage III   . Hypertension   . Migraines    "weekly" (01/06/2014)  . Sickle cell trait ([place])   . TIA (transient ischemic attack) 1978  . Type II diabetes mellitus ([place])   . Vertigo    Past Surgical History:  Procedure Laterality Date  . APPENDECTOMY  March 2014   had appendix frozen  . CARDIAC CATHETERIZATION  "years ago" & 01/05/2014  . CARDIOVERSION N/A 03/17/2014   Procedure: CARDIOVERSION;  Surgeon: [name], MD;  Location: [facility] ENDOSCOPY;  Service: Cardiovascular;  Laterality: N/A;  . CATARACT EXTRACTION W/ INTRAOCULAR LENS  IMPLANT, BILATERAL Bilateral  04/2013-05/2013  . ESOPHAGOGASTRODUODENOSCOPY  11/08/2007    Normal esophagus without evidence of Barrett, mass, erosion/ Normal stomach, duodenal bulb  . GASTRIC MOTILITY STUDY  11/13/2007   mildly delayed emptying subjectively, but normal  analysis-77% of tracer emptied at 2 hours  . JOINT REPLACEMENT    . LAPAROSCOPIC APPENDECTOMY N/A 09/15/2012   Procedure: APPENDECTOMY LAPAROSCOPIC;  Surgeon: [name], MD;  Location: [facility] ORS;  Service: General;  Laterality: N/A;  . LEFT HEART CATHETERIZATION WITH CORONARY ANGIOGRAM N/A 01/05/2014   Procedure: LEFT HEART CATHETERIZATION WITH CORONARY ANGIOGRAM;  Surgeon: [name], MD;  Location: [facility] CATH LAB;  Service: Cardiovascular;  Laterality: N/A;  . REPLACEMENT TOTAL KNEE Right 05-03-06  . TEE WITHOUT CARDIOVERSION N/A 03/17/2014   Procedure: TRANSESOPHAGEAL ECHOCARDIOGRAM (TEE);  Surgeon: [name], MD;  Location: [facility] ENDOSCOPY;  Service: Cardiovascular;  Laterality: N/A;  . TONSILLECTOMY  1972    Family History  Problem Relation Age of Onset  . Hyperlipidemia Mother   . Hypertension Mother   . Heart attack Mother   . Cancer Father        lung  . Hypertension Sister   . Diabetes Brother   . Heart disease Brother   . Hyperlipidemia Brother   . Hypertension Brother   . Heart attack Brother   . Other Brother        DVT    SOCIAL HISTORY: Social History   Social History  . Marital status: Divorced    Spouse name: N/A  . Number of children:  N/A  . Years of education: N/A   Occupational History  . Not on file.   Social History Main Topics  . Smoking status: Never Smoker  . Smokeless tobacco: Never Used  . Alcohol use No  . Drug use: No  . Sexual activity: Not Currently    Birth control/ protection: Post-menopausal   Other Topics Concern  . Not on file   Social History Narrative  . No narrative on file    Allergies  Allergen Reactions  . Penicillins Hives    Has patient had a PCN reaction causing  immediate rash, facial/tongue/throat swelling, SOB or lightheadedness with hypotension: no Has patient had a PCN reaction causing severe rash involving mucus membranes or skin necrosis: No  Has patient had a PCN reaction that required hospitalization: no Has patient had a PCN reaction occurring within the last 10 years: no If all of the above answers are "NO", then may proceed with Cephalosporin use.     Current Outpatient Prescriptions  Medication Sig Dispense Refill  . ACCU-CHEK AVIVA PLUS test strip     . ACCU-CHEK SOFTCLIX LANCETS lancets     . amitriptyline (ELAVIL) 25 MG tablet Take 25 mg by mouth at bedtime.     [name] amLODipine (NORVASC) 10 MG tablet Take 5 mg by mouth daily.     [name] aspirin 81 MG chewable tablet Chew 81 mg by mouth daily.    [name] atorvastatin (LIPITOR) 80 MG tablet Take 1 tablet (80 mg total) by mouth every evening. 90 tablet 3  . Cholecalciferol (VITAMIN D3) 10000 units TABS Take 1 tablet by mouth daily.    . clopidogrel (PLAVIX) 75 MG tablet Take 75 mg by mouth daily.    . clotrimazole-betamethasone (LOTRISONE) cream Apply 1 application topically 3 (three) times daily.     . colesevelam (WELCHOL) 625 MG tablet Take 1,875 mg by mouth 2 (two) times daily with a meal. *May take 6 tablets once a day with meals*    . furosemide (LASIX) 40 MG tablet Take 1 tablet (40 mg total) by mouth every morning.    [name] glimepiride (AMARYL) 2 MG tablet Take 9 mg by mouth daily with breakfast.     . HUMALOG 100 UNIT/ML injection Inject 1-10 Units into the skin 3 (three) times daily with meals.     . insulin glargine (LANTUS) 100 UNIT/ML injection Inject 37 Units into the skin at bedtime.     [name] lisinopril (PRINIVIL,ZESTRIL) 20 MG tablet Take 20 mg by mouth daily.    . nitroGLYCERIN (NITROSTAT) 0.4 MG SL tablet Place 1 tablet (0.4 mg total) under the tongue every 5 (five) minutes as needed for chest pain (up to 3 doses). 25 tablet 3  . tiZANidine (ZANAFLEX) 4 MG tablet Take 4 mg by mouth daily.     [name] apixaban (ELIQUIS) 5 MG TABS tablet Take 1 tablet (5 mg total) by mouth 2 (two) times daily. (Patient not taking: Reported on 01/25/2017) 180 tablet 3  . diazepam (VALIUM) 2 MG tablet Take 1 tablet (2 mg total) by mouth 2 (two) times daily. (Patient not taking: Reported on 01/25/2017) 5 tablet 0  . meclizine (ANTIVERT) 12.5 MG tablet Take 1 tablet (12.5 mg total) by mouth 3 (three) times daily as needed for dizziness. (Patient not taking: Reported on 01/19/2017) 30 tablet 0   No current facility-administered medications for this visit.     ROS:   General:  No weight loss, Fever, chills  HEENT: + recent headaches, no nasal  bleeding, no visual changes, no sore throat  Neurologic: No dizziness, blackouts, seizures. No recent symptoms of stroke or mini- strokeSet as per history of present illness. No recent episodes of slurred speech, or temporary blindness.  Cardiac: No recent episodes of chest pain/pressure, no shortness of breath at rest.  + shortness of breath with exertion.  Denies history of atrial fibrillation or irregular heartbeat  Vascular: No history of rest pain in feet.  No history of claudication.  No history of non-healing ulcer, No history of DVT   Pulmonary: No home oxygen, no productive cough, no hemoptysis,  No asthma or wheezing  Musculoskeletal:  [ ]  Arthritis, [ ]  Low back pain,  [ ]  Joint pain  Hematologic:No history of hypercoagulable state.  No history of easy bleeding.  No history of anemia  Gastrointestinal: No hematochezia or melena,  No gastroesophageal reflux, no trouble swallowing  Urinary: [X]  chronic Kidney disease, [ ]  on HD - [ ]  MWF or [ ]  TTHS, [ ]  Burning with urination, [ ]  Frequent urination, [ ]  Difficulty urinating;   Skin: No rashes  Psychological: No history of anxiety,  No history of depression   Physical Examination  Vitals:   01/25/17 0855 01/25/17 0856  BP: (!) 150/65 (!) 146/79  Pulse: 60   Resp: 18   SpO2: 97%   Weight: 203 lb  (92.1 kg)   Height: 5\' 6"  (1.676 m)     Body mass index is 32.77 kg/m.  General:  Alert and oriented, no acute distress HEENT: Normal Neck: No bruit or JVD Pulmonary: Clear to auscultation bilaterally Cardiac: Regular Rate and Rhythm without murmur Abdomen: Soft, non-tender, non-distended, no mass Skin: No rash Extremity Pulses:  2+ radial, brachial, femoral, dorsalis pedis, posterior tibial pulses bilaterally Musculoskeletal: No deformity or edema  Neurologic: Upper and lower extremity motor 5/5 and symmetric  DATA:  I reviewed the patient's recent carotid duplex exam from [facility] which shows a 50-70% right internal carotid artery stenosis. I also reviewed her recent CT Angio the neck. This shows a calcified irregular plaque at the right carotid bifurcation. Stenosis was estimated at around 50%. On my examination of this plaque I believe it is at least 50% if not more.  ASSESSMENT:  Symptomatic right internal carotid artery stenosis probably on the order of 50-70%. The plaque is irregular and most likely the source of recent TIA.   PLAN:  Patient will be scheduled for right carotid endarterectomy Monday, 02/05/2017. Risks benefits possible complications and procedure details were discussed the patient today and her daughter. These include but are not limited to bleeding infection stroke risk of 1-2% cranial nerve injury risk of 5-10%. She understands and agrees to proceed. We will stop her Eliquis 3 days prior to the procedure continue her antiplatelet therapy.   Ruta Hinds, MD [facility] Office: [phone] Pager: [phone]

## 2017-01-29 ENCOUNTER — Other Ambulatory Visit: Payer: Self-pay

## 2017-01-29 DIAGNOSIS — Z0001 Encounter for general adult medical examination with abnormal findings: Secondary | ICD-10-CM | POA: Diagnosis not present

## 2017-01-29 DIAGNOSIS — E119 Type 2 diabetes mellitus without complications: Secondary | ICD-10-CM | POA: Diagnosis not present

## 2017-01-29 DIAGNOSIS — I10 Essential (primary) hypertension: Secondary | ICD-10-CM | POA: Diagnosis not present

## 2017-01-29 DIAGNOSIS — I2510 Atherosclerotic heart disease of native coronary artery without angina pectoris: Secondary | ICD-10-CM | POA: Diagnosis not present

## 2017-01-30 DIAGNOSIS — I480 Paroxysmal atrial fibrillation: Secondary | ICD-10-CM | POA: Diagnosis not present

## 2017-01-30 DIAGNOSIS — M6281 Muscle weakness (generalized): Secondary | ICD-10-CM | POA: Diagnosis not present

## 2017-01-30 DIAGNOSIS — E1165 Type 2 diabetes mellitus with hyperglycemia: Secondary | ICD-10-CM | POA: Diagnosis not present

## 2017-01-30 DIAGNOSIS — Z7982 Long term (current) use of aspirin: Secondary | ICD-10-CM | POA: Diagnosis not present

## 2017-01-30 DIAGNOSIS — Z7901 Long term (current) use of anticoagulants: Secondary | ICD-10-CM | POA: Diagnosis not present

## 2017-01-30 DIAGNOSIS — I10 Essential (primary) hypertension: Secondary | ICD-10-CM | POA: Diagnosis not present

## 2017-01-30 DIAGNOSIS — Z794 Long term (current) use of insulin: Secondary | ICD-10-CM | POA: Diagnosis not present

## 2017-01-31 ENCOUNTER — Telehealth: Payer: Self-pay | Admitting: Internal Medicine

## 2017-01-31 NOTE — Pre-Procedure Instructions (Signed)
Cassandra Adams  01/31/2017      [place], [place] [address] [place] [address] Phone: [phone] Fax: [phone]    Your procedure is scheduled on August 13  Report to [facility] at [facility].M.  Call this number if you have problems the morning of surgery:  [phone]   Remember:  Do not eat food or drink liquids after midnight.   Take these medicines the morning of surgery with A SIP OF WATER amLODipine (NORVASC), nitroGLYCERIN (NITROSTAT)  If needed, HYDROcodone-acetaminophen (NORCO/VICODIN)  7 days prior to surgery STOP taking any Aleve, Naproxen, Ibuprofen, Motrin, Advil, Goody's, BC's, all herbal medications, fish oil, and all vitamins  Follow your doctors instructions regarding your Aspirin.  If no instructions were given by the doctor you will need to call the office to get instructions.  Your pre admission RN will also call for those instructions  Stop Eliquis 3 days prior to surgery  WHAT DO I DO ABOUT MY DIABETES MEDICATION?   [name] Do not take oral diabetes medicines (pills) the morning of surgery. glimepiride (AMARYL)   . THE NIGHT BEFORE SURGERY, take _____7______ units of ___insulin glargine (LANTUS) ________insulin.       . The day of surgery, do not take other diabetes injectables, including Byetta (exenatide), Bydureon (exenatide ER), Victoza (liraglutide), or Trulicity (dulaglutide).  . If your CBG is greater than 220 mg/dL, you may take  of your sliding scale (correction) dose of insulin.   How to Manage Your Diabetes Before and After Surgery  Why is it important to control my blood sugar before and after surgery? . Improving blood sugar levels before and after surgery helps healing and can limit problems. . A way of improving blood sugar control is eating a healthy diet by: o  Eating less sugar and carbohydrates o  Increasing activity/exercise o  Talking with  your doctor about reaching your blood sugar goals . High blood sugars (greater than 180 mg/dL) can raise your risk of infections and slow your recovery, so you will need to focus on controlling your diabetes during the weeks before surgery. . Make sure that the doctor who takes care of your diabetes knows about your planned surgery including the date and location.  How do I manage my blood sugar before surgery? . Check your blood sugar at least 4 times a day, starting 2 days before surgery, to make sure that the level is not too high or low. o Check your blood sugar the morning of your surgery when you wake up and every 2 hours until you get to the Short Stay unit. . If your blood sugar is less than 70 mg/dL, you will need to treat for low blood sugar: o Do not take insulin. o Treat a low blood sugar (less than 70 mg/dL) with  cup of clear juice (cranberry or apple), 4 glucose tablets, OR glucose gel. o Recheck blood sugar in 15 minutes after treatment (to make sure it is greater than 70 mg/dL). If your blood sugar is not greater than 70 mg/dL on recheck, call [phone] for further instructions. . Report your blood sugar to the short stay nurse when you get to Short Stay.  . If you are admitted to the hospital after surgery: o Your blood sugar will be checked by the staff and you will probably be given insulin after surgery (instead of oral diabetes medicines) to make sure you have  good blood sugar levels. o The goal for blood sugar control after surgery is 80-180 mg/dL.    Do not wear jewelry, make-up or nail polish.  Do not wear lotions, powders, or perfumes, or deoderant.  Do not shave 48 hours prior to surgery.  Men may shave face and neck.  Do not bring valuables to the hospital.  [facility] is not responsible for any belongings or valuables.  Contacts, dentures or bridgework may not be worn into surgery.  Leave your suitcase in the car.  After surgery it may be brought to your  room.  For patients admitted to the hospital, discharge time will be determined by your treatment team.  Patients discharged the day of surgery will not be allowed to drive home.    Special instructions:   [facility]- Preparing For Surgery  Before surgery, you can play an important role. Because skin is not sterile, your skin needs to be as free of germs as possible. You can reduce the number of germs on your skin by washing with CHG (chlorahexidine gluconate) Soap before surgery.  CHG is an antiseptic cleaner which kills germs and bonds with the skin to continue killing germs even after washing.  Please do not use if you have an allergy to CHG or antibacterial soaps. If your skin becomes reddened/irritated stop using the CHG.  Do not shave (including legs and underarms) for at least 48 hours prior to first CHG shower. It is OK to shave your face.  Please follow these instructions carefully.   1. Shower the NIGHT BEFORE SURGERY and the MORNING OF SURGERY with CHG.   2. If you chose to wash your hair, wash your hair first as usual with your normal shampoo.  3. After you shampoo, rinse your hair and body thoroughly to remove the shampoo.  4. Use CHG as you would any other liquid soap. You can apply CHG directly to the skin and wash gently with a scrungie or a clean washcloth.   5. Apply the CHG Soap to your body ONLY FROM THE NECK DOWN.  Do not use on open wounds or open sores. Avoid contact with your eyes, ears, mouth and genitals (private parts). Wash genitals (private parts) with your normal soap.  6. Wash thoroughly, paying special attention to the area where your surgery will be performed.  7. Thoroughly rinse your body with warm water from the neck down.  8. DO NOT shower/wash with your normal soap after using and rinsing off the CHG Soap.  9. Pat yourself dry with a CLEAN TOWEL.   10. Wear CLEAN PAJAMAS   11. Place CLEAN SHEETS on your bed the night of your first shower and  DO NOT SLEEP WITH PETS.    Day of Surgery: Do not apply any deodorants/lotions. Please wear clean clothes to the hospital/surgery center.      Please read over the following fact sheets that you were given.

## 2017-01-31 NOTE — Telephone Encounter (Signed)
Acceptable risk for carotid surgery - hold Eliquis 3 days prior to procedure.  Dr. Debara Pickett

## 2017-01-31 NOTE — Telephone Encounter (Signed)
[facility] requests clearance for: 1. Type of surgery: right carotid endarterectomy  2. Date of surgery: 02/05/2017 3. Surgeon: Dr. Ruta Hinds 4. Medications that need to be held & how long: Eliquis 3 days prior - last dose to be taken on 8/9 5. Fax and/or Phone: (p) [phone]  (f) [phone]

## 2017-01-31 NOTE — Telephone Encounter (Signed)
Clearance routed via EPIC to MD in-basket

## 2017-02-01 ENCOUNTER — Encounter (HOSPITAL_COMMUNITY)
Admission: RE | Admit: 2017-02-01 | Discharge: 2017-02-01 | Disposition: A | Discharge status: Home / Self Care | Payer: Medicare Other | Source: Ambulatory Visit | Attending: Vascular Surgery | Admitting: Vascular Surgery

## 2017-02-01 ENCOUNTER — Encounter (HOSPITAL_COMMUNITY): Payer: Self-pay

## 2017-02-01 DIAGNOSIS — I6521 Occlusion and stenosis of right carotid artery: Secondary | ICD-10-CM | POA: Diagnosis not present

## 2017-02-01 DIAGNOSIS — Z01812 Encounter for preprocedural laboratory examination: Secondary | ICD-10-CM | POA: Insufficient documentation

## 2017-02-01 LAB — COMPREHENSIVE METABOLIC PANEL
ALT: 113 U/L — AB (ref 14–54)
AST: 123 U/L — AB (ref 15–41)
Albumin: 3.8 g/dL (ref 3.5–5.0)
Alkaline Phosphatase: 111 U/L (ref 38–126)
Anion gap: 10 (ref 5–15)
BILIRUBIN TOTAL: 0.6 mg/dL (ref 0.3–1.2)
BUN: 19 mg/dL (ref 6–20)
CO2: 24 mmol/L (ref 22–32)
CREATININE: 1.39 mg/dL — AB (ref 0.44–1.00)
Calcium: 10.4 mg/dL — ABNORMAL HIGH (ref 8.9–10.3)
Chloride: 105 mmol/L (ref 101–111)
GFR calc Af Amer: 43 mL/min — ABNORMAL LOW (ref >60)
GFR, EST NON AFRICAN AMERICAN: 37 mL/min — AB (ref >60)
Glucose, Bld: 96 mg/dL (ref 65–99)
POTASSIUM: 3.7 mmol/L (ref 3.5–5.1)
Sodium: 139 mmol/L (ref 135–145)
TOTAL PROTEIN: 7.4 g/dL (ref 6.5–8.1)

## 2017-02-01 LAB — URINALYSIS, ROUTINE W REFLEX MICROSCOPIC
BACTERIA UA: NONE SEEN
Bilirubin Urine: NEGATIVE
Glucose, UA: 50 mg/dL — AB
Hgb urine dipstick: NEGATIVE
Ketones, ur: NEGATIVE mg/dL
Nitrite: NEGATIVE
Protein, ur: NEGATIVE mg/dL
SPECIFIC GRAVITY, URINE: 1.01 (ref 1.005–1.030)
pH: 5 (ref 5.0–8.0)

## 2017-02-01 LAB — CBC
HEMATOCRIT: 37.2 % (ref 36.0–46.0)
Hemoglobin: 12.8 g/dL (ref 12.0–15.0)
MCH: 26.9 pg (ref 26.0–34.0)
MCHC: 34.4 g/dL (ref 30.0–36.0)
MCV: 78.2 fL (ref 78.0–100.0)
Platelets: 192 10*3/uL (ref 150–400)
RBC: 4.76 MIL/uL (ref 3.87–5.11)
RDW: 14.9 % (ref 11.5–15.5)
WBC: 4.8 10*3/uL (ref 4.0–10.5)

## 2017-02-01 LAB — SURGICAL PCR SCREEN
MRSA, PCR: NEGATIVE
Staphylococcus aureus: NEGATIVE

## 2017-02-01 LAB — APTT: aPTT: 40 seconds — ABNORMAL HIGH (ref 24–36)

## 2017-02-01 LAB — NO BLOOD PRODUCTS

## 2017-02-01 LAB — PROTIME-INR
INR: 1.28
PROTHROMBIN TIME: 16.1 s — AB (ref 11.4–15.2)

## 2017-02-01 LAB — GLUCOSE, CAPILLARY: GLUCOSE-CAPILLARY: 146 mg/dL — AB (ref 65–99)

## 2017-02-01 NOTE — Progress Notes (Addendum)
PCP: Redmond School, MD  Cardiologist: Dr. Lyman Bishop  EKG: 01/19/17 in EPIC  Stress test: pt denies  ECHO: 12/2016 in EPIC  Cardiac Cath: 12/2013 in EPIC  Chest x-ray: pt denies past year

## 2017-02-02 MED ORDER — VANCOMYCIN HCL IN DEXTROSE 1-5 GM/200ML-% IV SOLN
1000.0000 mg | INTRAVENOUS | Status: AC
  Administered 2017-02-05: 1000 mg via INTRAVENOUS
  Filled 2017-02-02: qty 200

## 2017-02-02 MED ORDER — SODIUM CHLORIDE 0.9 % IV SOLN
INTRAVENOUS | Status: DC
  Administered 2017-02-05: 09:00:00 via INTRAVENOUS

## 2017-02-04 NOTE — Anesthesia Preprocedure Evaluation (Addendum)
Anesthesia Evaluation  Patient identified by MRN, date of birth, ID band Patient awake    Reviewed: Allergy & Precautions, NPO status , Patient's Chart, lab work & pertinent test results  Airway Mallampati: II  TM Distance: >3 FB Neck ROM: Full    Dental  (+) Dental Advisory Given   Pulmonary neg pulmonary ROS,    breath sounds clear to auscultation       Cardiovascular hypertension, + CAD, + Past MI and + Peripheral Vascular Disease   Rhythm:Regular Rate:Normal     Neuro/Psych  Headaches, TIA   GI/Hepatic Neg liver ROS, GERD  ,  Endo/Other  diabetes, Type 2, Insulin Dependent  Renal/GU Renal disease     Musculoskeletal   Abdominal   Peds  Hematology negative hematology ROS (+)   Anesthesia Other Findings   Reproductive/Obstetrics                            Lab Results  Component Value Date   WBC 4.8 02/01/2017   HGB 12.8 02/01/2017   HCT 37.2 02/01/2017   MCV 78.2 02/01/2017   PLT 192 02/01/2017   Lab Results  Component Value Date   CREATININE 1.39 (H) 02/01/2017   BUN 19 02/01/2017   NA 139 02/01/2017   K 3.7 02/01/2017   CL 105 02/01/2017   CO2 24 02/01/2017   Mild LVH with moderate basal septal LV hypertrophy and LVEF   55-60%. Grade 2 diastolic dysfunction. Mild left atrial   enlargement. Mild to moderate mitral annular calcification with   mild mitral regurgitation. Trivial tricuspid regurgitation. No   obvious PFO or ASD. Anesthesia Physical Anesthesia Plan  ASA: III  Anesthesia Plan: General   Post-op Pain Management:    Induction: Intravenous  PONV Risk Score and Plan: 3 and Ondansetron, Dexamethasone and Treatment may vary due to age or medical condition  Airway Management Planned: Oral ETT  Additional Equipment: Arterial line  Intra-op Plan:   Post-operative Plan: Extubation in OR  Informed Consent: I have reviewed the patients History and Physical,  chart, labs and discussed the procedure including the risks, benefits and alternatives for the proposed anesthesia with the patient or authorized representative who has indicated his/her understanding and acceptance.   Dental advisory given  Plan Discussed with: CRNA  Anesthesia Plan Comments:        Anesthesia Quick Evaluation

## 2017-02-05 ENCOUNTER — Inpatient Hospital Stay (HOSPITAL_COMMUNITY): Payer: Medicare Other

## 2017-02-05 ENCOUNTER — Inpatient Hospital Stay (HOSPITAL_COMMUNITY): Payer: Medicare Other | Admitting: Anesthesiology

## 2017-02-05 ENCOUNTER — Inpatient Hospital Stay (HOSPITAL_COMMUNITY)
Admission: RE | Admit: 2017-02-05 | Discharge: 2017-02-07 | DRG: 039 | Disposition: A | Discharge status: Home Health | Payer: Medicare Other | Source: Ambulatory Visit | Attending: Vascular Surgery | Admitting: Vascular Surgery

## 2017-02-05 ENCOUNTER — Encounter (HOSPITAL_COMMUNITY)
Admission: RE | Disposition: A | Discharge status: Home Health | Payer: Self-pay | Source: Ambulatory Visit | Attending: Vascular Surgery

## 2017-02-05 ENCOUNTER — Encounter (HOSPITAL_COMMUNITY): Payer: Self-pay

## 2017-02-05 DIAGNOSIS — Z8249 Family history of ischemic heart disease and other diseases of the circulatory system: Secondary | ICD-10-CM

## 2017-02-05 DIAGNOSIS — N183 Chronic kidney disease, stage 3 (moderate): Secondary | ICD-10-CM | POA: Diagnosis present

## 2017-02-05 DIAGNOSIS — E1151 Type 2 diabetes mellitus with diabetic peripheral angiopathy without gangrene: Secondary | ICD-10-CM | POA: Diagnosis not present

## 2017-02-05 DIAGNOSIS — D573 Sickle-cell trait: Secondary | ICD-10-CM | POA: Diagnosis present

## 2017-02-05 DIAGNOSIS — Z833 Family history of diabetes mellitus: Secondary | ICD-10-CM | POA: Diagnosis not present

## 2017-02-05 DIAGNOSIS — R531 Weakness: Secondary | ICD-10-CM | POA: Diagnosis not present

## 2017-02-05 DIAGNOSIS — I6521 Occlusion and stenosis of right carotid artery: Secondary | ICD-10-CM | POA: Diagnosis not present

## 2017-02-05 DIAGNOSIS — I2510 Atherosclerotic heart disease of native coronary artery without angina pectoris: Secondary | ICD-10-CM | POA: Diagnosis present

## 2017-02-05 DIAGNOSIS — Z8673 Personal history of transient ischemic attack (TIA), and cerebral infarction without residual deficits: Secondary | ICD-10-CM | POA: Diagnosis not present

## 2017-02-05 DIAGNOSIS — Z96651 Presence of right artificial knee joint: Secondary | ICD-10-CM | POA: Diagnosis present

## 2017-02-05 DIAGNOSIS — I129 Hypertensive chronic kidney disease with stage 1 through stage 4 chronic kidney disease, or unspecified chronic kidney disease: Secondary | ICD-10-CM | POA: Diagnosis present

## 2017-02-05 DIAGNOSIS — I480 Paroxysmal atrial fibrillation: Secondary | ICD-10-CM | POA: Diagnosis not present

## 2017-02-05 DIAGNOSIS — Z7901 Long term (current) use of anticoagulants: Secondary | ICD-10-CM

## 2017-02-05 DIAGNOSIS — E1140 Type 2 diabetes mellitus with diabetic neuropathy, unspecified: Secondary | ICD-10-CM | POA: Diagnosis not present

## 2017-02-05 DIAGNOSIS — I6529 Occlusion and stenosis of unspecified carotid artery: Secondary | ICD-10-CM | POA: Diagnosis present

## 2017-02-05 DIAGNOSIS — I252 Old myocardial infarction: Secondary | ICD-10-CM | POA: Diagnosis not present

## 2017-02-05 DIAGNOSIS — R42 Dizziness and giddiness: Secondary | ICD-10-CM | POA: Diagnosis not present

## 2017-02-05 DIAGNOSIS — I10 Essential (primary) hypertension: Secondary | ICD-10-CM | POA: Diagnosis not present

## 2017-02-05 DIAGNOSIS — E1122 Type 2 diabetes mellitus with diabetic chronic kidney disease: Secondary | ICD-10-CM | POA: Diagnosis present

## 2017-02-05 DIAGNOSIS — Z7902 Long term (current) use of antithrombotics/antiplatelets: Secondary | ICD-10-CM | POA: Diagnosis not present

## 2017-02-05 DIAGNOSIS — Z7982 Long term (current) use of aspirin: Secondary | ICD-10-CM

## 2017-02-05 DIAGNOSIS — I6523 Occlusion and stenosis of bilateral carotid arteries: Secondary | ICD-10-CM | POA: Diagnosis not present

## 2017-02-05 HISTORY — PX: ENDARTERECTOMY: SHX5162

## 2017-02-05 LAB — CBC
HEMATOCRIT: 32.9 % — AB (ref 36.0–46.0)
HEMOGLOBIN: 11.4 g/dL — AB (ref 12.0–15.0)
MCH: 26.8 pg (ref 26.0–34.0)
MCHC: 34.7 g/dL (ref 30.0–36.0)
MCV: 77.4 fL — ABNORMAL LOW (ref 78.0–100.0)
Platelets: 153 10*3/uL (ref 150–400)
RBC: 4.25 MIL/uL (ref 3.87–5.11)
RDW: 14.7 % (ref 11.5–15.5)
WBC: 6.5 10*3/uL (ref 4.0–10.5)

## 2017-02-05 LAB — GLUCOSE, CAPILLARY
GLUCOSE-CAPILLARY: 273 mg/dL — AB (ref 65–99)
GLUCOSE-CAPILLARY: 226 mg/dL — AB (ref 65–99)
Glucose-Capillary: 244 mg/dL — ABNORMAL HIGH (ref 65–99)

## 2017-02-05 LAB — CREATININE, SERUM
Creatinine, Ser: 1.38 mg/dL — ABNORMAL HIGH (ref 0.44–1.00)
GFR calc Af Amer: 43 mL/min — ABNORMAL LOW (ref >60)
GFR calc non Af Amer: 37 mL/min — ABNORMAL LOW (ref >60)

## 2017-02-05 SURGERY — ENDARTERECTOMY, CAROTID
Anesthesia: General | Site: Neck | Laterality: Right

## 2017-02-05 MED ORDER — INSULIN GLARGINE 100 UNIT/ML ~~LOC~~ SOLN
15.0000 [IU] | Freq: Every day | SUBCUTANEOUS | Status: DC
  Administered 2017-02-05 – 2017-02-06 (×2): 15 [IU] via SUBCUTANEOUS
  Filled 2017-02-05 (×3): qty 0.15

## 2017-02-05 MED ORDER — SUGAMMADEX SODIUM 200 MG/2ML IV SOLN
INTRAVENOUS | Status: AC
  Filled 2017-02-05: qty 2

## 2017-02-05 MED ORDER — ONDANSETRON HCL 4 MG/2ML IJ SOLN
INTRAMUSCULAR | Status: DC | PRN
  Administered 2017-02-05: 4 mg via INTRAVENOUS

## 2017-02-05 MED ORDER — METOPROLOL TARTRATE 5 MG/5ML IV SOLN: 2.0000 mg | INTRAVENOUS | Status: DC | PRN

## 2017-02-05 MED ORDER — ONDANSETRON HCL 4 MG/2ML IJ SOLN
INTRAMUSCULAR | Status: AC
Start: 2017-02-05 — End: ?
  Filled 2017-02-05: qty 2

## 2017-02-05 MED ORDER — FENTANYL CITRATE (PF) 100 MCG/2ML IJ SOLN: 25.0000 ug | INTRAMUSCULAR | Status: DC | PRN

## 2017-02-05 MED ORDER — LISINOPRIL 10 MG PO TABS
10.0000 mg | ORAL_TABLET | Freq: Every day | ORAL | Status: DC
  Administered 2017-02-06 – 2017-02-07 (×2): 10 mg via ORAL
  Filled 2017-02-05 (×2): qty 1

## 2017-02-05 MED ORDER — HEPARIN SODIUM (PORCINE) 1000 UNIT/ML IJ SOLN
INTRAMUSCULAR | Status: DC | PRN
  Administered 2017-02-05: 9000 [IU] via INTRAVENOUS

## 2017-02-05 MED ORDER — DEXMEDETOMIDINE HCL IN NACL 200 MCG/50ML IV SOLN
INTRAVENOUS | Status: AC
Start: 2017-02-05 — End: ?
  Filled 2017-02-05: qty 50

## 2017-02-05 MED ORDER — GLYCOPYRROLATE 0.2 MG/ML IJ SOLN
INTRAMUSCULAR | Status: DC | PRN
  Administered 2017-02-05: 0.2 mg via INTRAVENOUS

## 2017-02-05 MED ORDER — ALUM & MAG HYDROXIDE-SIMETH 200-200-20 MG/5ML PO SUSP: 15.0000 mL | ORAL | Status: DC | PRN

## 2017-02-05 MED ORDER — PROPOFOL 10 MG/ML IV BOLUS
INTRAVENOUS | Status: DC | PRN
  Administered 2017-02-05: 120 mg via INTRAVENOUS

## 2017-02-05 MED ORDER — LIDOCAINE HCL (PF) 1 % IJ SOLN
INTRAMUSCULAR | Status: AC
  Filled 2017-02-05: qty 30

## 2017-02-05 MED ORDER — SENNOSIDES-DOCUSATE SODIUM 8.6-50 MG PO TABS: 1.0000 | ORAL_TABLET | Freq: Every evening | ORAL | Status: DC | PRN

## 2017-02-05 MED ORDER — ONDANSETRON HCL 4 MG/2ML IJ SOLN: 4.0000 mg | Freq: Four times a day (QID) | INTRAMUSCULAR | Status: DC | PRN

## 2017-02-05 MED ORDER — ROCURONIUM BROMIDE 10 MG/ML (PF) SYRINGE
PREFILLED_SYRINGE | INTRAVENOUS | Status: AC
  Filled 2017-02-05: qty 5

## 2017-02-05 MED ORDER — LABETALOL HCL 5 MG/ML IV SOLN
10.0000 mg | INTRAVENOUS | Status: DC | PRN
  Administered 2017-02-06: 10 mg via INTRAVENOUS
  Filled 2017-02-05: qty 4

## 2017-02-05 MED ORDER — NITROGLYCERIN 0.4 MG SL SUBL: 0.4000 mg | SUBLINGUAL_TABLET | SUBLINGUAL | Status: DC | PRN

## 2017-02-05 MED ORDER — FUROSEMIDE 40 MG PO TABS
40.0000 mg | ORAL_TABLET | Freq: Two times a day (BID) | ORAL | Status: DC
  Administered 2017-02-05 – 2017-02-07 (×4): 40 mg via ORAL
  Filled 2017-02-05 (×4): qty 1

## 2017-02-05 MED ORDER — FENTANYL CITRATE (PF) 250 MCG/5ML IJ SOLN
INTRAMUSCULAR | Status: AC
  Filled 2017-02-05: qty 5

## 2017-02-05 MED ORDER — OXYCODONE-ACETAMINOPHEN 5-325 MG PO TABS: 1.0000 | ORAL_TABLET | ORAL | Status: DC | PRN

## 2017-02-05 MED ORDER — TIZANIDINE HCL 4 MG PO TABS
4.0000 mg | ORAL_TABLET | Freq: Every day | ORAL | Status: DC
  Administered 2017-02-05 – 2017-02-06 (×2): 4 mg via ORAL
  Filled 2017-02-05 (×2): qty 1

## 2017-02-05 MED ORDER — HEPARIN SODIUM (PORCINE) 1000 UNIT/ML IJ SOLN
INTRAMUSCULAR | Status: AC
  Filled 2017-02-05: qty 1

## 2017-02-05 MED ORDER — ATORVASTATIN CALCIUM 80 MG PO TABS
80.0000 mg | ORAL_TABLET | Freq: Every day | ORAL | Status: DC
  Administered 2017-02-05 – 2017-02-06 (×2): 80 mg via ORAL
  Filled 2017-02-05 (×2): qty 1

## 2017-02-05 MED ORDER — LACTATED RINGERS IV SOLN
INTRAVENOUS | Status: DC | PRN
Start: 2017-02-05 — End: 2017-02-05
  Administered 2017-02-05: 07:00:00 via INTRAVENOUS

## 2017-02-05 MED ORDER — GLIMEPIRIDE 4 MG PO TABS
6.0000 mg | ORAL_TABLET | Freq: Every day | ORAL | Status: DC
  Administered 2017-02-06 – 2017-02-07 (×2): 6 mg via ORAL
  Filled 2017-02-05 (×2): qty 1

## 2017-02-05 MED ORDER — ENOXAPARIN SODIUM 40 MG/0.4ML ~~LOC~~ SOLN
40.0000 mg | SUBCUTANEOUS | Status: DC
  Administered 2017-02-06: 40 mg via SUBCUTANEOUS
  Filled 2017-02-05: qty 0.4

## 2017-02-05 MED ORDER — INSULIN ASPART 100 UNIT/ML ~~LOC~~ SOLN
0.0000 [IU] | Freq: Three times a day (TID) | SUBCUTANEOUS | Status: DC
  Administered 2017-02-05: 5 [IU] via SUBCUTANEOUS
  Administered 2017-02-06: 3 [IU] via SUBCUTANEOUS
  Administered 2017-02-06: 9 [IU] via SUBCUTANEOUS
  Administered 2017-02-06 – 2017-02-07 (×2): 5 [IU] via SUBCUTANEOUS
  Filled 2017-02-05 (×26): qty 0.09

## 2017-02-05 MED ORDER — HYDRALAZINE HCL 20 MG/ML IJ SOLN: 5.0000 mg | INTRAMUSCULAR | Status: DC | PRN

## 2017-02-05 MED ORDER — COLESEVELAM HCL 625 MG PO TABS
1875.0000 mg | ORAL_TABLET | Freq: Two times a day (BID) | ORAL | Status: DC
  Administered 2017-02-06 – 2017-02-07 (×3): 1875 mg via ORAL
  Filled 2017-02-05 (×4): qty 3

## 2017-02-05 MED ORDER — AMLODIPINE BESYLATE 5 MG PO TABS
5.0000 mg | ORAL_TABLET | Freq: Every day | ORAL | Status: DC
  Administered 2017-02-06 – 2017-02-07 (×2): 5 mg via ORAL
  Filled 2017-02-05 (×2): qty 1

## 2017-02-05 MED ORDER — PANTOPRAZOLE SODIUM 40 MG PO TBEC
40.0000 mg | DELAYED_RELEASE_TABLET | Freq: Every day | ORAL | Status: DC
  Administered 2017-02-05 – 2017-02-07 (×3): 40 mg via ORAL
  Filled 2017-02-05 (×3): qty 1

## 2017-02-05 MED ORDER — FENTANYL CITRATE (PF) 250 MCG/5ML IJ SOLN
INTRAMUSCULAR | Status: DC | PRN
  Administered 2017-02-05: 150 ug via INTRAVENOUS
  Administered 2017-02-05: 50 ug via INTRAVENOUS

## 2017-02-05 MED ORDER — GUAIFENESIN-DM 100-10 MG/5ML PO SYRP: 15.0000 mL | ORAL_SOLUTION | ORAL | Status: DC | PRN

## 2017-02-05 MED ORDER — DOCUSATE SODIUM 100 MG PO CAPS
100.0000 mg | ORAL_CAPSULE | Freq: Every day | ORAL | Status: DC
  Administered 2017-02-06 – 2017-02-07 (×2): 100 mg via ORAL
  Filled 2017-02-05 (×2): qty 1

## 2017-02-05 MED ORDER — SODIUM CHLORIDE 0.9 % IV SOLN
INTRAVENOUS | Status: DC | PRN
  Administered 2017-02-05: 500 mL

## 2017-02-05 MED ORDER — ENOXAPARIN SODIUM 30 MG/0.3ML ~~LOC~~ SOLN: 30.0000 mg | SUBCUTANEOUS | Status: DC

## 2017-02-05 MED ORDER — POTASSIUM CHLORIDE CRYS ER 20 MEQ PO TBCR: 20.0000 meq | EXTENDED_RELEASE_TABLET | Freq: Every day | ORAL | Status: DC | PRN

## 2017-02-05 MED ORDER — DEXMEDETOMIDINE HCL 200 MCG/2ML IV SOLN
INTRAVENOUS | Status: DC | PRN
  Administered 2017-02-05: 12 ug via INTRAVENOUS

## 2017-02-05 MED ORDER — MAGNESIUM SULFATE 2 GM/50ML IV SOLN
2.0000 g | Freq: Every day | INTRAVENOUS | Status: DC | PRN
  Filled 2017-02-05: qty 50

## 2017-02-05 MED ORDER — MIDAZOLAM HCL 2 MG/2ML IJ SOLN
INTRAMUSCULAR | Status: AC
  Filled 2017-02-05: qty 2

## 2017-02-05 MED ORDER — LIDOCAINE 2% (20 MG/ML) 5 ML SYRINGE
INTRAMUSCULAR | Status: AC
Start: 2017-02-05 — End: ?
  Filled 2017-02-05: qty 5

## 2017-02-05 MED ORDER — LIDOCAINE HCL (CARDIAC) 20 MG/ML IV SOLN
INTRAVENOUS | Status: DC | PRN
  Administered 2017-02-05: 60 mg via INTRATRACHEAL

## 2017-02-05 MED ORDER — 0.9 % SODIUM CHLORIDE (POUR BTL) OPTIME
TOPICAL | Status: DC | PRN
  Administered 2017-02-05: 2000 mL

## 2017-02-05 MED ORDER — PROPOFOL 10 MG/ML IV BOLUS
INTRAVENOUS | Status: AC
  Filled 2017-02-05: qty 20

## 2017-02-05 MED ORDER — SUGAMMADEX SODIUM 200 MG/2ML IV SOLN
INTRAVENOUS | Status: DC | PRN
  Administered 2017-02-05: 200 mg via INTRAVENOUS

## 2017-02-05 MED ORDER — SODIUM CHLORIDE 0.9 % IV SOLN
INTRAVENOUS | Status: DC
  Administered 2017-02-05 (×2): via INTRAVENOUS

## 2017-02-05 MED ORDER — BISACODYL 5 MG PO TBEC: 5.0000 mg | DELAYED_RELEASE_TABLET | Freq: Every day | ORAL | Status: DC | PRN

## 2017-02-05 MED ORDER — PHENOL 1.4 % MT LIQD
1.0000 | OROMUCOSAL | Status: DC | PRN
Start: 2017-02-05 — End: 2017-02-07

## 2017-02-05 MED ORDER — HYDROCODONE-ACETAMINOPHEN 5-325 MG PO TABS
1.0000 | ORAL_TABLET | ORAL | Status: DC | PRN
  Administered 2017-02-06 – 2017-02-07 (×2): 1 via ORAL
  Filled 2017-02-05 (×2): qty 1

## 2017-02-05 MED ORDER — ROCURONIUM BROMIDE 100 MG/10ML IV SOLN
INTRAVENOUS | Status: DC | PRN
  Administered 2017-02-05: 40 mg via INTRAVENOUS
  Administered 2017-02-05 (×2): 20 mg via INTRAVENOUS

## 2017-02-05 MED ORDER — CHLORHEXIDINE GLUCONATE 4 % EX LIQD: 60.0000 mL | Freq: Once | CUTANEOUS | Status: DC

## 2017-02-05 MED ORDER — IOPAMIDOL (ISOVUE-370) INJECTION 76%
INTRAVENOUS | Status: AC
  Administered 2017-02-05: 50 mL via INTRAVENOUS
  Filled 2017-02-05: qty 50

## 2017-02-05 MED ORDER — PROTAMINE SULFATE 10 MG/ML IV SOLN
INTRAVENOUS | Status: AC
  Filled 2017-02-05: qty 5

## 2017-02-05 MED ORDER — PHENYLEPHRINE HCL 10 MG/ML IJ SOLN
INTRAVENOUS | Status: DC | PRN
  Administered 2017-02-05: 25 ug/min via INTRAVENOUS

## 2017-02-05 MED ORDER — PROTAMINE SULFATE 10 MG/ML IV SOLN
INTRAVENOUS | Status: DC | PRN
  Administered 2017-02-05: 10 mg via INTRAVENOUS
  Administered 2017-02-05 (×2): 20 mg via INTRAVENOUS

## 2017-02-05 MED ORDER — ASPIRIN 81 MG PO CHEW
81.0000 mg | CHEWABLE_TABLET | Freq: Every day | ORAL | Status: DC
  Administered 2017-02-05 – 2017-02-07 (×3): 81 mg via ORAL
  Filled 2017-02-05 (×3): qty 1

## 2017-02-05 MED ORDER — SODIUM CHLORIDE 0.9 % IV SOLN: 500.0000 mL | Freq: Once | INTRAVENOUS | Status: DC | PRN

## 2017-02-05 MED ORDER — ACETAMINOPHEN 325 MG PO TABS
650.0000 mg | ORAL_TABLET | Freq: Four times a day (QID) | ORAL | Status: DC | PRN
  Administered 2017-02-05: 650 mg via ORAL
  Filled 2017-02-05 (×2): qty 2

## 2017-02-05 SURGICAL SUPPLY — 47 items
ADH SKN CLS APL DERMABOND .7 (GAUZE/BANDAGES/DRESSINGS) ×1
AGENT HMST SPONGE THK3/8 (HEMOSTASIS)
CANISTER SUCT 3000ML PPV (MISCELLANEOUS) ×2 IMPLANT
CANNULA VESSEL 3MM 2 BLNT TIP (CANNULA) ×4 IMPLANT
CATH ROBINSON RED A/P 18FR (CATHETERS) ×2 IMPLANT
CLIP VESOCCLUDE MED 6/CT (CLIP) ×2 IMPLANT
CLIP VESOCCLUDE SM WIDE 6/CT (CLIP) ×2 IMPLANT
CRADLE DONUT ADULT HEAD (MISCELLANEOUS) ×2 IMPLANT
DECANTER SPIKE VIAL GLASS SM (MISCELLANEOUS) IMPLANT
DERMABOND ADVANCED (GAUZE/BANDAGES/DRESSINGS) ×1
DERMABOND ADVANCED .7 DNX12 (GAUZE/BANDAGES/DRESSINGS) ×1 IMPLANT
DRAIN HEMOVAC 1/8 X 5 (WOUND CARE) IMPLANT
ELECT REM PT RETURN 9FT ADLT (ELECTROSURGICAL) ×2
ELECTRODE REM PT RTRN 9FT ADLT (ELECTROSURGICAL) ×1 IMPLANT
EVACUATOR SILICONE 100CC (DRAIN) IMPLANT
GLOVE BIO SURGEON STRL SZ7.5 (GLOVE) ×2 IMPLANT
GLOVE BIOGEL PI IND STRL 6.5 (GLOVE) IMPLANT
GLOVE BIOGEL PI INDICATOR 6.5 (GLOVE) ×1
GLOVE SURG SS PI 6.5 STRL IVOR (GLOVE) ×1 IMPLANT
GOWN STRL NON-REIN LRG LVL3 (GOWN DISPOSABLE) ×1 IMPLANT
GOWN STRL REUS W/ TWL LRG LVL3 (GOWN DISPOSABLE) ×3 IMPLANT
GOWN STRL REUS W/TWL LRG LVL3 (GOWN DISPOSABLE) ×6
HEMOSTAT SPONGE AVITENE ULTRA (HEMOSTASIS) IMPLANT
KIT BASIN OR (CUSTOM PROCEDURE TRAY) ×2 IMPLANT
KIT ROOM TURNOVER OR (KITS) ×2 IMPLANT
KIT SHUNT ARGYLE CAROTID ART 6 (VASCULAR PRODUCTS) IMPLANT
LOOP VESSEL MINI RED (MISCELLANEOUS) ×1 IMPLANT
NDL HYPO 25GX1X1/2 BEV (NEEDLE) IMPLANT
NEEDLE HYPO 25GX1X1/2 BEV (NEEDLE) IMPLANT
NS IRRIG 1000ML POUR BTL (IV SOLUTION) ×4 IMPLANT
PACK CAROTID (CUSTOM PROCEDURE TRAY) ×2 IMPLANT
PAD ARMBOARD 7.5X6 YLW CONV (MISCELLANEOUS) ×4 IMPLANT
PATCH HEMASHIELD 8X75 (Vascular Products) ×1 IMPLANT
SHUNT CAROTID BYPASS 10 (VASCULAR PRODUCTS) ×1 IMPLANT
SHUNT CAROTID BYPASS 12FRX15.5 (VASCULAR PRODUCTS) IMPLANT
SUT ETHILON 3 0 PS 1 (SUTURE) IMPLANT
SUT PROLENE 6 0 CC (SUTURE) ×2 IMPLANT
SUT PROLENE 7 0 BV 1 (SUTURE) ×2 IMPLANT
SUT SILK 3 0 (SUTURE) ×2
SUT SILK 3 0 TIES 17X18 (SUTURE)
SUT SILK 3-0 18XBRD TIE 12 (SUTURE) IMPLANT
SUT SILK 3-0 18XBRD TIE BLK (SUTURE) IMPLANT
SUT VIC AB 3-0 SH 27 (SUTURE) ×2
SUT VIC AB 3-0 SH 27X BRD (SUTURE) ×1 IMPLANT
SUT VICRYL 4-0 PS2 18IN ABS (SUTURE) ×2 IMPLANT
SYR CONTROL 10ML LL (SYRINGE) IMPLANT
WATER STERILE IRR 1000ML POUR (IV SOLUTION) ×2 IMPLANT

## 2017-02-05 NOTE — Anesthesia Postprocedure Evaluation (Signed)
Anesthesia Post Note  Patient: Cassandra Adams  Procedure(s) Performed: Procedure(s) (LRB): ENDARTERECTOMY CAROTID-RIGHT (Right)     Patient location during evaluation: PACU Anesthesia Type: General Level of consciousness: awake and alert Pain management: pain level controlled Vital Signs Assessment: post-procedure vital signs reviewed and stable Respiratory status: spontaneous breathing, nonlabored ventilation, respiratory function stable and patient connected to nasal cannula oxygen Cardiovascular status: blood pressure returned to baseline and stable Postop Assessment: no signs of nausea or vomiting Anesthetic complications: no    Last Vitals:  Vitals:   02/05/17 1319 02/05/17 1330  BP: (!) 106/47   Pulse: (!) 46 (!) 45  Resp: 14 16  Temp:    SpO2: 100% 100%    Last Pain:  Vitals:   02/05/17 1330  TempSrc:   PainSc: [name]

## 2017-02-05 NOTE — Progress Notes (Signed)
[facility]  Patient Status: Active (receiving services up to time of hospitalization)  [facility] is providing the following services: RN and PT  If patient discharges after hours, please call [phone].   Cassandra Adams 02/05/2017, 3:11 PM

## 2017-02-05 NOTE — Progress Notes (Signed)
Pt in PACU.  Was moving extremities to command on leaving OR.  She is now very lethargic and difficult to arouse.  She does move all extremities spontaneously but not to command.    Will send for CTA head and neck. Will decide on code stroke based on CT findings and clinical status   Ruta Hinds, MD [facility] [place] Office: [phone] Pager: [phone]

## 2017-02-05 NOTE — Anesthesia Procedure Notes (Signed)
Arterial Line Insertion Start/End8/13/2018 7:11 AM, 02/05/2017 7:18 AM Performed by: Mariea Clonts, CRNA  Preanesthetic checklist: patient identified, IV checked, site marked, risks and benefits discussed, surgical consent, monitors and equipment checked, pre-op evaluation and timeout performed Lidocaine 1% used for infiltration radial was placed Catheter size: 20 G Hand hygiene performed  and maximum sterile barriers used   Attempts: 2 Procedure performed without using ultrasound guided technique. Following insertion, dressing applied and Biopatch. Post procedure assessment: normal  Patient tolerated the procedure well with no immediate complications.

## 2017-02-05 NOTE — Progress Notes (Signed)
Patient headed to [place] with Silvio Clayman on monitor for further evaluation.

## 2017-02-05 NOTE — Progress Notes (Signed)
    Following commands, slowly Right neck incision without hematoma Smile symmetric without tongue deviation, tolerated small amouts of liquids.  S/P right CEA  Nahiara Kretzschmar MAUREEN PA-C

## 2017-02-05 NOTE — Anesthesia Procedure Notes (Signed)
Procedure Name: Intubation Date/Time: 02/05/2017 7:48 AM Performed by: Mariea Clonts Pre-anesthesia Checklist: Patient identified, Emergency Drugs available, Suction available and Patient being monitored Patient Re-evaluated:Patient Re-evaluated prior to induction Oxygen Delivery Method: Circle System Utilized Preoxygenation: Pre-oxygenation with 100% oxygen Induction Type: IV induction Ventilation: Mask ventilation without difficulty Laryngoscope Size: Mac and 4 Grade View: Grade I Tube type: Oral Tube size: 7.0 mm Number of attempts: 1 Airway Equipment and Method: Stylet and Oral airway Placement Confirmation: ETT inserted through vocal cords under direct vision,  positive ETCO2 and breath sounds checked- equal and bilateral Tube secured with: Tape Dental Injury: Teeth and Oropharynx as per pre-operative assessment

## 2017-02-05 NOTE — Op Note (Signed)
Procedure Right carotid endarterectomy  Preoperative diagnosis: Symptomatic right internal carotid artery stenosis  Postoperative diagnosis: Same  Anesthesia General  Asst.: Gerri Lins, [facility]  Operative findings: #1 greater than 70% right internal carotid stenosis                                #2 Dacron patch  Operative details: After obtaining informed consent, the patient was taken to the operating room. The patient was placed in a supine position on the operating room table. After induction of general anesthesia the patient's entire neck and chest was prepped and draped in the usual sterile fashion. An oblique incision was made on the right aspect of the patient's neck anterior to the border the right sternocleidomastoid muscle. The incision was carried into the subcutaneous tissues and through the platysma. The sternocleidomastoid muscle was identified and reflected laterally.  The common carotid artery was then found at the base of the incision this was dissected free circumferentially. It was fairly soft on palpation.  The vagus nerve was identified and protected. Dissection was then carried up to the level carotid bifurcation.  This was a high bifurcation. The hyperglossal nerve was draped over the bifurcation area and had to be mobilized to get to the internal lesion.  The ansa and several tethering vein and arterial branches were ligated and divided between silk ties.  The internal carotid artery was dissected free circumferentially just above the level of the hypoglossal nerve and it was soft in character at this location and above any palpable disease. A vessel loop was placed around this. Next the external carotid and superior thyroid arteries were dissected free circumferentially and vessel loops were placed around these.  There was also a large ascending pharyngeal branch and a loop was placed around this. The patient was given 9000 units of intravenous heparin.  After 2 minutes of  circulation time and raising the mean arterial pressure to 90 mm mercury, the distal internal carotid artery was controlled with small bulldog clamp. The external carotid and superior thyroid arteries were controlled with vessel loops. The common carotid artery was controlled with a peripheral DeBakey clamp. A longitudinal opening was made in the common carotid artery just below the bifurcation. The arteriotomy was extended distally up into the internal carotid with Potts scissors. There was a large calcified plaque with greater than 70% stenosis in the internal carotid.  A 10 Fr shunt was brought onto the field but due to the high lesion it would be difficult to place.  There was vigorous backbleeding from the internal so I elected to not shunt.  The distal internal was controlled with a Kitzmiller clamp.  Attention was then turned to the common carotid artery once again. A suitable endarterectomy plane was obtained and endarterectomy was begun in the common carotid artery and a good proximal endpoint was obtained. An eversion endarterectomy was performed on the external carotid artery and a good endpoint was obtained. The plaque was then elevated in the internal carotid artery and a nice feathered distal endpoint was also obtained.  The plaque was passed off the table. All loose debris was then removed from the carotid bed and everything was thoroughly irrigated with heparinized saline. A Dacron patch was then brought on to the operative field and this was sewn on as a patch angioplasty using a running 6-0 Prolene suture. Prior to completion of the anastomosis the internal carotid artery was thoroughly  backbled. This was then controlled again with a kitzmiller clamp.  The common carotid was thoroughly flushed forward. The external carotid was also thoroughly backbled.  The remainder of the patch was completed and the anastomosis was secured. Flow was then restored first retrograde from the external carotid into  the carotid bed then antegrade from the common carotid to the external carotid artery and after approximately 5 cardiac cycles to the internal carotid artery. Doppler was used to evaluate the external/internal and common carotid arteries and these all had good Doppler flow. Hemostasis was obtained. The patient was also given 50 mg of Protamine.      The platysma muscle was reapproximated using a running 3-0 Vicryl suture. The skin was closed with 4 0 Vicryl subcuticular stitch.  The patient was awakened in the operating room and was moving upper and lower extremities symmetrically and following commands.  The patient was stable on arrival to the PACU.  Ruta Hinds, MD [facility] [place] Office: [phone] Pager: [phone]

## 2017-02-05 NOTE — Interval H&P Note (Signed)
History and Physical Interval Note:  02/05/2017 7:29 AM  Cassandra Adams  has presented today for surgery, with the diagnosis of Right Internal Carotid Stenosis I65.21  The various methods of treatment have been discussed with the patient and family. After consideration of risks, benefits and other options for treatment, the patient has consented to  Procedure(s): ENDARTERECTOMY CAROTID-RIGHT (Right) as a surgical intervention .  The patient's history has been reviewed, patient examined, no change in status, stable for surgery.  I have reviewed the patient's chart and labs.  Questions were answered to the patient's satisfaction.     Ruta Hinds

## 2017-02-05 NOTE — H&P (View-Only) (Signed)
Referring Physician: Dr Roderic Palau  Patient name: Cassandra Adams MRN: [id number] DOB: 04/26/1946 Sex: female  REASON FOR CONSULT: symptomatic right internal carotid artery stenosis  HPI: Cassandra Adams is a 71 y.o. female valuation of a symptomatic right internal carotid artery stenosis. The patient was recently seen at [facility] after suffering a TIA. This affected her left arm and left leg. She had weakness in both areas. This lasted for several hours and then completely resolved. She denies any prior symptoms of TIA amaurosis or stroke but has been followed by our office in the past for mild carotid stenosis. She was last seen in 2015. She currently is on aspirin and Plavix and Eliquis. However her records states that she is only supposed to be on aspirin and Eliquis. Other medical problems include coronary artery disease, CK D3, hypertension which are been stable. She also has diabetes. She is on a statin.  She is on Eliquis for paroxysmal atrial fibrillation.  Past Medical History:  Diagnosis Date  . Arthritis    "all over"  . CAD (coronary artery disease)    a. NSTEMI 12/2013 - occluded dominant RCA with L-R collaterals, moderate prox segmental LAD disease, for medical therapy initially, EF 60% with subtle inferobasal hypokinesia. Consider PCI for refractory CP.  [name] CKD (chronic kidney disease), stage III   . Hypertension   . Migraines    "weekly" (01/06/2014)  . Sickle cell trait ([place])   . TIA (transient ischemic attack) 1978  . Type II diabetes mellitus ([place])   . Vertigo    Past Surgical History:  Procedure Laterality Date  . APPENDECTOMY  March 2014   had appendix frozen  . CARDIAC CATHETERIZATION  "years ago" & 01/05/2014  . CARDIOVERSION N/A 03/17/2014   Procedure: CARDIOVERSION;  Surgeon: [name], MD;  Location: [facility] ENDOSCOPY;  Service: Cardiovascular;  Laterality: N/A;  . CATARACT EXTRACTION W/ INTRAOCULAR LENS  IMPLANT, BILATERAL Bilateral  04/2013-05/2013  . ESOPHAGOGASTRODUODENOSCOPY  11/08/2007    Normal esophagus without evidence of Barrett, mass, erosion/ Normal stomach, duodenal bulb  . GASTRIC MOTILITY STUDY  11/13/2007   mildly delayed emptying subjectively, but normal  analysis-77% of tracer emptied at 2 hours  . JOINT REPLACEMENT    . LAPAROSCOPIC APPENDECTOMY N/A 09/15/2012   Procedure: APPENDECTOMY LAPAROSCOPIC;  Surgeon: [name], MD;  Location: [facility] ORS;  Service: General;  Laterality: N/A;  . LEFT HEART CATHETERIZATION WITH CORONARY ANGIOGRAM N/A 01/05/2014   Procedure: LEFT HEART CATHETERIZATION WITH CORONARY ANGIOGRAM;  Surgeon: [name], MD;  Location: [facility] CATH LAB;  Service: Cardiovascular;  Laterality: N/A;  . REPLACEMENT TOTAL KNEE Right 05-03-06  . TEE WITHOUT CARDIOVERSION N/A 03/17/2014   Procedure: TRANSESOPHAGEAL ECHOCARDIOGRAM (TEE);  Surgeon: [name], MD;  Location: [facility] ENDOSCOPY;  Service: Cardiovascular;  Laterality: N/A;  . TONSILLECTOMY  1972    Family History  Problem Relation Age of Onset  . Hyperlipidemia Mother   . Hypertension Mother   . Heart attack Mother   . Cancer Father        lung  . Hypertension Sister   . Diabetes Brother   . Heart disease Brother   . Hyperlipidemia Brother   . Hypertension Brother   . Heart attack Brother   . Other Brother        DVT    SOCIAL HISTORY: Social History   Social History  . Marital status: Divorced    Spouse name: N/A  . Number of children:  N/A  . Years of education: N/A   Occupational History  . Not on file.   Social History Main Topics  . Smoking status: Never Smoker  . Smokeless tobacco: Never Used  . Alcohol use No  . Drug use: No  . Sexual activity: Not Currently    Birth control/ protection: Post-menopausal   Other Topics Concern  . Not on file   Social History Narrative  . No narrative on file    Allergies  Allergen Reactions  . Penicillins Hives    Has patient had a PCN reaction causing  immediate rash, facial/tongue/throat swelling, SOB or lightheadedness with hypotension: no Has patient had a PCN reaction causing severe rash involving mucus membranes or skin necrosis: No  Has patient had a PCN reaction that required hospitalization: no Has patient had a PCN reaction occurring within the last 10 years: no If all of the above answers are "NO", then may proceed with Cephalosporin use.     Current Outpatient Prescriptions  Medication Sig Dispense Refill  . ACCU-CHEK AVIVA PLUS test strip     . ACCU-CHEK SOFTCLIX LANCETS lancets     . amitriptyline (ELAVIL) 25 MG tablet Take 25 mg by mouth at bedtime.     [name] amLODipine (NORVASC) 10 MG tablet Take 5 mg by mouth daily.     [name] aspirin 81 MG chewable tablet Chew 81 mg by mouth daily.    [name] atorvastatin (LIPITOR) 80 MG tablet Take 1 tablet (80 mg total) by mouth every evening. 90 tablet 3  . Cholecalciferol (VITAMIN D3) 10000 units TABS Take 1 tablet by mouth daily.    . clopidogrel (PLAVIX) 75 MG tablet Take 75 mg by mouth daily.    . clotrimazole-betamethasone (LOTRISONE) cream Apply 1 application topically 3 (three) times daily.     . colesevelam (WELCHOL) 625 MG tablet Take 1,875 mg by mouth 2 (two) times daily with a meal. *May take 6 tablets once a day with meals*    . furosemide (LASIX) 40 MG tablet Take 1 tablet (40 mg total) by mouth every morning.    [name] glimepiride (AMARYL) 2 MG tablet Take 9 mg by mouth daily with breakfast.     . HUMALOG 100 UNIT/ML injection Inject 1-10 Units into the skin 3 (three) times daily with meals.     . insulin glargine (LANTUS) 100 UNIT/ML injection Inject 37 Units into the skin at bedtime.     [name] lisinopril (PRINIVIL,ZESTRIL) 20 MG tablet Take 20 mg by mouth daily.    . nitroGLYCERIN (NITROSTAT) 0.4 MG SL tablet Place 1 tablet (0.4 mg total) under the tongue every 5 (five) minutes as needed for chest pain (up to 3 doses). 25 tablet 3  . tiZANidine (ZANAFLEX) 4 MG tablet Take 4 mg by mouth daily.     [name] apixaban (ELIQUIS) 5 MG TABS tablet Take 1 tablet (5 mg total) by mouth 2 (two) times daily. (Patient not taking: Reported on 01/25/2017) 180 tablet 3  . diazepam (VALIUM) 2 MG tablet Take 1 tablet (2 mg total) by mouth 2 (two) times daily. (Patient not taking: Reported on 01/25/2017) 5 tablet 0  . meclizine (ANTIVERT) 12.5 MG tablet Take 1 tablet (12.5 mg total) by mouth 3 (three) times daily as needed for dizziness. (Patient not taking: Reported on 01/19/2017) 30 tablet 0   No current facility-administered medications for this visit.     ROS:   General:  No weight loss, Fever, chills  HEENT: + recent headaches, no nasal  bleeding, no visual changes, no sore throat  Neurologic: No dizziness, blackouts, seizures. No recent symptoms of stroke or mini- strokeSet as per history of present illness. No recent episodes of slurred speech, or temporary blindness.  Cardiac: No recent episodes of chest pain/pressure, no shortness of breath at rest.  + shortness of breath with exertion.  Denies history of atrial fibrillation or irregular heartbeat  Vascular: No history of rest pain in feet.  No history of claudication.  No history of non-healing ulcer, No history of DVT   Pulmonary: No home oxygen, no productive cough, no hemoptysis,  No asthma or wheezing  Musculoskeletal:  [ ]  Arthritis, [ ]  Low back pain,  [ ]  Joint pain  Hematologic:No history of hypercoagulable state.  No history of easy bleeding.  No history of anemia  Gastrointestinal: No hematochezia or melena,  No gastroesophageal reflux, no trouble swallowing  Urinary: [X]  chronic Kidney disease, [ ]  on HD - [ ]  MWF or [ ]  TTHS, [ ]  Burning with urination, [ ]  Frequent urination, [ ]  Difficulty urinating;   Skin: No rashes  Psychological: No history of anxiety,  No history of depression   Physical Examination  Vitals:   01/25/17 0855 01/25/17 0856  BP: (!) 150/65 (!) 146/79  Pulse: 60   Resp: 18   SpO2: 97%   Weight: 203 lb  (92.1 kg)   Height: 5\' 6"  (1.676 m)     Body mass index is 32.77 kg/m.  General:  Alert and oriented, no acute distress HEENT: Normal Neck: No bruit or JVD Pulmonary: Clear to auscultation bilaterally Cardiac: Regular Rate and Rhythm without murmur Abdomen: Soft, non-tender, non-distended, no mass Skin: No rash Extremity Pulses:  2+ radial, brachial, femoral, dorsalis pedis, posterior tibial pulses bilaterally Musculoskeletal: No deformity or edema  Neurologic: Upper and lower extremity motor 5/5 and symmetric  DATA:  I reviewed the patient's recent carotid duplex exam from [facility] which shows a 50-70% right internal carotid artery stenosis. I also reviewed her recent CT Angio the neck. This shows a calcified irregular plaque at the right carotid bifurcation. Stenosis was estimated at around 50%. On my examination of this plaque I believe it is at least 50% if not more.  ASSESSMENT:  Symptomatic right internal carotid artery stenosis probably on the order of 50-70%. The plaque is irregular and most likely the source of recent TIA.   PLAN:  Patient will be scheduled for right carotid endarterectomy Monday, 02/05/2017. Risks benefits possible complications and procedure details were discussed the patient today and her daughter. These include but are not limited to bleeding infection stroke risk of 1-2% cranial nerve injury risk of 5-10%. She understands and agrees to proceed. We will stop her Eliquis 3 days prior to the procedure continue her antiplatelet therapy.   Ruta Hinds, MD [facility] Office: [phone] Pager: [phone]

## 2017-02-05 NOTE — Progress Notes (Signed)
PT CTA of head neck reviewed with Dr Jobe Igo.  No evidence of intracranial bleed or dissection or occlusion of large vessel.  Pt is more reactive now and does follow some commands but is still difficult to arouse at times and non focal.  Will have Neuro evaluate.  Family updated.  Ruta Hinds, MD [facility] [place] Office: [phone] Pager: [phone]

## 2017-02-05 NOTE — Transfer of Care (Signed)
Immediate Anesthesia Transfer of Care Note  Patient: Cassandra Adams  Procedure(s) Performed: Procedure(s): ENDARTERECTOMY CAROTID-RIGHT (Right)  Patient Location: PACU  Anesthesia Type:General  Level of Consciousness: awake, alert  and oriented  Airway & Oxygen Therapy: Patient Spontanous Breathing and Patient connected to nasal cannula oxygen  Post-op Assessment: Report given to RN, Post -op Vital signs reviewed and stable, Patient moving all extremities X 4 and Patient able to stick tongue midline  Post vital signs: Reviewed and stable  Last Vitals:  Vitals:   02/05/17 0539 02/05/17 1042  BP: (!) 163/76   Pulse: 73   Resp: 18   Temp: 36.6 C (P) 36.8 C  SpO2: 100%     Last Pain:  Vitals:   02/05/17 0551  TempSrc:   PainSc: 0-No pain      Patients Stated Pain Goal: 4 (77/03/40 3524)  Complications: No apparent anesthesia complications

## 2017-02-05 NOTE — Consult Note (Signed)
NEURO HOSPITALIST CONSULT NOTE   Requestig physician: Dr. Oneida Alar   Reason for Consult: not waking up well after CEA   History obtained from:  Nurse/patient  HPI:                                                                                                                                          Cassandra Adams is an 71 y.o. female who presented to the hospital today for a CEA of the right internal carotid artery secondary to symptomatic right internal carotid artery. Patient underwent surgery however post surgery patient was very slow to wake up to the point to which surgery Department was concerned about possible stroke versus prolonged effects of anesthetics. Patient was sent down for a stat CT of the head in addition to CTA of head and neck. This revealed no restenosis of the right internal carotid artery. Upon arriving back the PACU, per nurse, patient had improvement in her mentation and was more awake.  Currently the patient's only complaint is headache.  Past Medical History:  Diagnosis Date  . Arthritis    "all over"  . CAD (coronary artery disease)    a. NSTEMI 12/2013 - occluded dominant RCA with L-R collaterals, moderate prox segmental LAD disease, for medical therapy initially, EF 60% with subtle inferobasal hypokinesia. Consider PCI for refractory CP.  [name] CKD (chronic kidney disease), stage III   . Hypertension   . Migraines    "weekly" (01/06/2014)  . Sickle cell trait ([place])   . TIA (transient ischemic attack) 1978  . Type II diabetes mellitus ([place])   . Vertigo     Past Surgical History:  Procedure Laterality Date  . APPENDECTOMY  March 2014   had appendix frozen  . CARDIAC CATHETERIZATION  "years ago" & 01/05/2014  . CARDIOVERSION N/A 03/17/2014   Procedure: CARDIOVERSION;  Surgeon: [name], MD;  Location: [facility] ENDOSCOPY;  Service: Cardiovascular;  Laterality: N/A;  . CATARACT EXTRACTION W/ INTRAOCULAR LENS  IMPLANT, BILATERAL  Bilateral 04/2013-05/2013  . ESOPHAGOGASTRODUODENOSCOPY  11/08/2007    Normal esophagus without evidence of Barrett, mass, erosion/ Normal stomach, duodenal bulb  . GASTRIC MOTILITY STUDY  11/13/2007   mildly delayed emptying subjectively, but normal  analysis-77% of tracer emptied at 2 hours  . JOINT REPLACEMENT    . LAPAROSCOPIC APPENDECTOMY N/A 09/15/2012   Procedure: APPENDECTOMY LAPAROSCOPIC;  Surgeon: [name], MD;  Location: [facility] ORS;  Service: General;  Laterality: N/A;  . LEFT HEART CATHETERIZATION WITH CORONARY ANGIOGRAM N/A 01/05/2014   Procedure: LEFT HEART CATHETERIZATION WITH CORONARY ANGIOGRAM;  Surgeon: [name], MD;  Location: [facility] CATH LAB;  Service: Cardiovascular;  Laterality: N/A;  . REPLACEMENT TOTAL KNEE Right 05-03-06  . TEE WITHOUT CARDIOVERSION N/A 03/17/2014   Procedure: TRANSESOPHAGEAL  ECHOCARDIOGRAM (TEE);  Surgeon: [name], MD;  Location: [facility] ENDOSCOPY;  Service: Cardiovascular;  Laterality: N/A;  . TONSILLECTOMY  1972    Family History  Problem Relation Age of Onset  . Hyperlipidemia Mother   . Hypertension Mother   . Heart attack Mother   . Cancer Father        lung  . Hypertension Sister   . Diabetes Brother   . Heart disease Brother   . Hyperlipidemia Brother   . Hypertension Brother   . Heart attack Brother   . Other Brother        DVT      Social History:  reports that she has never smoked. She has never used smokeless tobacco. She reports that she does not drink alcohol or use drugs.  Allergies  Allergen Reactions  . Penicillins Hives    Has patient had a PCN reaction causing immediate rash, facial/tongue/throat swelling, SOB or lightheadedness with hypotension: no Has patient had a PCN reaction causing severe rash involving mucus membranes or skin necrosis: No  Has patient had a PCN reaction that required hospitalization: no Has patient had a PCN reaction occurring within the last 10 years: no If all of the above answers  are "NO", then may proceed with Cephalosporin use.     MEDICATIONS:                                                                                                                     Prior to Admission:  Prescriptions Prior to Admission  Medication Sig Dispense Refill Last Dose  . amLODipine (NORVASC) 10 MG tablet Take 5 mg by mouth daily.    02/05/2017 at 0300  . aspirin 81 MG chewable tablet Chew 81 mg by mouth daily.   02/04/2017 at Unknown time  . atorvastatin (LIPITOR) 80 MG tablet Take 1 tablet (80 mg total) by mouth every evening. (Patient taking differently: Take 80 mg by mouth daily at 6 PM. ) 90 tablet 3 02/04/2017 at Unknown time  . clotrimazole-betamethasone (LOTRISONE) cream Apply 1 application topically 3 (three) times daily. foot   02/04/2017 at Unknown time  . colesevelam (WELCHOL) 625 MG tablet Take 1,875 mg by mouth 2 (two) times daily with a meal. *May take 6 tablets once a day with meals*   02/04/2017 at Unknown time  . furosemide (LASIX) 40 MG tablet Take 1 tablet (40 mg total) by mouth every morning. (Patient taking differently: Take 40 mg by mouth 2 (two) times daily. )   02/04/2017 at Unknown time  . glimepiride (AMARYL) 2 MG tablet Take 6 mg by mouth daily with breakfast.    02/04/2017 at Unknown time  . HUMALOG 100 UNIT/ML injection Inject 1-10 Units into the skin 3 (three) times daily with meals. Per sliding scale   02/04/2017 at Unknown time  . HYDROcodone-acetaminophen (NORCO/VICODIN) 5-325 MG tablet Take 1 tablet by mouth every 4 (four) hours as needed (migraines).      . insulin glargine (LANTUS) 100  UNIT/ML injection Inject 15 Units into the skin at bedtime.    02/04/2017 at Unknown time  . lisinopril (PRINIVIL,ZESTRIL) 10 MG tablet Take 10 mg by mouth daily.    02/04/2017 at Unknown time  . tiZANidine (ZANAFLEX) 4 MG tablet Take 4 mg by mouth at bedtime.    02/04/2017 at Unknown time  . ACCU-CHEK AVIVA PLUS test strip    Taking  . ACCU-CHEK SOFTCLIX LANCETS lancets     Taking  . apixaban (ELIQUIS) 5 MG TABS tablet Take 1 tablet (5 mg total) by mouth 2 (two) times daily. (Patient not taking: Reported on 01/25/2017) 180 tablet 3 Not Taking  . diazepam (VALIUM) 2 MG tablet Take 1 tablet (2 mg total) by mouth 2 (two) times daily. (Patient not taking: Reported on 01/25/2017) 5 tablet 0 Not Taking  . meclizine (ANTIVERT) 12.5 MG tablet Take 1 tablet (12.5 mg total) by mouth 3 (three) times daily as needed for dizziness. (Patient not taking: Reported on 01/19/2017) 30 tablet 0 Not Taking  . nitroGLYCERIN (NITROSTAT) 0.4 MG SL tablet Place 1 tablet (0.4 mg total) under the tongue every 5 (five) minutes as needed for chest pain (up to 3 doses). 25 tablet 3 More than a month at Unknown time   Scheduled: . chlorhexidine  60 mL Topical Once   And  . [START ON 02/06/2017] chlorhexidine  60 mL Topical Once  . insulin aspart  0-9 Units Subcutaneous TID WC     ROS:                                                                                                                                       History obtained from unobtainable from patient due to Significant drowsiness     Blood pressure (!) 107/49, pulse (!) 42, temperature 98.2 F (36.8 C), resp. rate 15, height 5\' 6"  (1.676 m), weight 90.3 kg (199 lb), SpO2 100 %.   Neurologic Examination:                                                                                                      HEENT-  Normocephalic, no lesions, without obvious abnormality.  Normal external eye and conjunctiva.  Normal TM's bilaterally.  Normal auditory canals and external ears. Normal external nose, mucus membranes and septum.  Normal pharynx. Cardiovascular- S1, S2 normal, pulses palpable throughout   Lungs- chest clear, no wheezing, rales, normal symmetric air entry Abdomen- normal findings: bowel sounds normal Extremities- no edema Lymph-no adenopathy  palpable Musculoskeletal-no joint tenderness, deformity or swelling Skin-warm and  dry, no hyperpigmentation, vitiligo, or suspicious lesions  Neurological Examination Mental Status: Alert, oriented to the fact that she is at the hospital for a surgery on her neck.  Speech fluent without evidence of aphasia however she speaks very hypophonic and still remains drowsy.  Able to follow simple commands without difficulty. Cranial Nerves: II:  Visual fields grossly normal,  III,IV, VI: ptosis not present, extra-ocular motions intact bilaterally pupils equal, round, reactive to light and accommodation V,VII: smile symmetric, facial light touch sensation normal bilaterally VIII: hearing normal bilaterally IX,X: uvula rises symmetrically XI: bilateral shoulder shrug XII: midline tongue extension Motor: Right : Upper extremity   5/5    Left:     Upper extremity   5/5  Lower extremity   5/5     Lower extremity   5/5 Tone and bulk:normal tone throughout; no atrophy noted Sensory: Pinprick and light touch intact throughout, bilaterally Deep Tendon Reflexes: 2+ and symmetric throughout Plantars: Right: downgoing   Left: downgoing Cerebellar: normal finger-to-nose,       Lab Results: Basic Metabolic Panel:  Recent Labs Lab 02/01/17 1325  NA 139  K 3.7  CL 105  CO2 24  GLUCOSE 96  BUN 19  CREATININE 1.39*  CALCIUM 10.4*    Liver Function Tests:  Recent Labs Lab 02/01/17 1325  AST 123*  ALT 113*  ALKPHOS 111  BILITOT 0.6  PROT 7.4  ALBUMIN 3.8   No results for input(s): LIPASE, AMYLASE in the last 168 hours. No results for input(s): AMMONIA in the last 168 hours.  CBC:  Recent Labs Lab 02/01/17 1325  WBC 4.8  HGB 12.8  HCT 37.2  MCV 78.2  PLT 192    Cardiac Enzymes: No results for input(s): CKTOTAL, CKMB, CKMBINDEX, TROPONINI in the last 168 hours.  Lipid Panel: No results for input(s): CHOL, TRIG, HDL, CHOLHDL, VLDL, LDLCALC in the last 168 hours.  CBG:  Recent Labs Lab 02/01/17 1306 02/05/17 1043  GLUCAP 146* 84*     Microbiology: Results for orders placed or performed during the hospital encounter of 02/01/17  Surgical pcr screen     Status: None   Collection Time: 02/01/17  1:05 PM  Result Value Ref Range Status   MRSA, PCR NEGATIVE NEGATIVE Final   Staphylococcus aureus NEGATIVE NEGATIVE Final    Comment:        The Xpert SA Assay (FDA approved for NASAL specimens in patients over 67 years of age), is one component of a comprehensive surveillance program.  Test performance has been validated by [facility] for patients greater than or equal to 31 year old. It is not intended to diagnose infection nor to guide or monitor treatment.     Coagulation Studies: No results for input(s): LABPROT, INR in the last 72 hours.  Imaging: Ct Angio Head W Or Wo Contrast  Result Date: 02/05/2017 CLINICAL DATA:  Increased lethargy and difficult to arouse after right carotid endarterectomy. The patient is moving all extremities spontaneously, but not to command. EXAM: CT ANGIOGRAPHY HEAD AND NECK TECHNIQUE: Multidetector CT imaging of the head and neck was performed using the standard protocol during bolus administration of intravenous contrast. Multiplanar CT image reconstructions and MIPs were obtained to evaluate the vascular anatomy. Carotid stenosis measurements (when applicable) are obtained utilizing NASCET criteria, using the distal internal carotid diameter as the denominator. CONTRAST:  50 mL Isovue 370 COMPARISON:  MRI brain 01/19/2017. FINDINGS: CT HEAD FINDINGS  Brain: No acute infarct, hemorrhage, or mass lesion is present. Ventricles are normal size. No significant extra-axial fluid collection is present. Vascular: Atherosclerotic calcifications are present within the cavernous internal carotid artery is. There is no hyperdense vessel. Skull: The calvarium is intact. Sinuses: Minimal fluid is present in the posterior left ethmoid air cell and medial left sphenoid sinus. Chronic right maxillary  sinus disease is present. Orbits: Bilateral lens replacements are present. The globes and orbits are otherwise within normal limits. Review of the MIP images confirms the above findings CTA NECK FINDINGS Aortic arch: A 3 vessel arch configuration is present. Atherosclerotic calcifications are present at the arch and origins of the great vessels without significant proximal stenosis. Right carotid system: The right common carotid artery demonstrates mild tortuosity without significant stenosis. Right carotid endarterectomy is noted. There is no stenosis or occlusion. The cervical right internal carotid artery is normal. Left carotid system: The left common carotid artery is within normal limits. Atherosclerotic calcifications are present at the left carotid bifurcation. There is no significant stenosis relative to the more distal vessel. The remainder of the left internal carotid artery is within normal limits. Vertebral arteries: Atherosclerotic calcifications are present at the origins of the vertebral arteries bilaterally. There is a moderate to high-grade stenosis proximally on the right. No tandem stenoses are present in either vertebral artery. The left vertebral artery is the dominant vessel. Skeleton: Leftward curvature is present in the cervical spine. There is fusion at C5-6 level. Chronic endplate changes present C6-7. No focal lytic or blastic lesions are present. The patient is edentulous. Other neck: Gas is present about the right carotid sheath following endarterectomy. The soft tissues of the neck are otherwise unremarkable. Upper chest: Mild dependent atelectasis is present the lung apices bilaterally. Review of the MIP images confirms the above findings CTA HEAD FINDINGS Anterior circulation: Dense calcifications are present within the cavernous internal carotid artery is bilaterally. There are moderate stenoses bilaterally within the paraophthalmic segments. The terminal ICA is normal bilaterally.  There is no occlusion. The A1 M1 segments are within normal limits. The MCA bifurcations are intact. ACA and MCA branch vessels are within normal limits. Posterior circulation: The left vertebral artery is the dominant vessel. Atherosclerotic calcifications are present at the dural margin without a significant stenosis. The basilar artery is normal. Both posterior cerebral arteries originate from the basilar tip. The PCA branch vessels are within normal limits bilaterally. Venous sinuses: The dural sinuses are patent. The left transverse sinus is dominant. Cortical veins are unremarkable. Anatomic variants: None Delayed phase: No pathologic enhancement is present. Review of the MIP images confirms the above findings IMPRESSION: 1. Right carotid endarterectomy without evidence for acute stenosis or occlusion. The right carotid artery is widely patent. 2. Atherosclerotic changes of the left carotid bifurcation without significant stenosis. 3. Circumferential calcification within the cavernous internal carotid arteries bilaterally with moderate bilateral stenosis of the paraophthalmic segment relative to the more distal terminal ICA. 4. Distal segmental narrowing and small vessel disease within the ACA and MCA branch vessels without a significant proximal stenosis, aneurysm, or branch vessel occlusion otherwise. These results were discussed in person at the time of interpretation on 02/05/2017 at 12:45 pm with Dr. Ruta Hinds , who verbally acknowledged these results. Electronically Signed   By: San Morelle M.D.   On: 02/05/2017 13:37   Ct Angio Neck W Or Wo Contrast  Result Date: 02/05/2017 CLINICAL DATA:  Increased lethargy and difficult to arouse after right carotid endarterectomy.  The patient is moving all extremities spontaneously, but not to command. EXAM: CT ANGIOGRAPHY HEAD AND NECK TECHNIQUE: Multidetector CT imaging of the head and neck was performed using the standard protocol during bolus  administration of intravenous contrast. Multiplanar CT image reconstructions and MIPs were obtained to evaluate the vascular anatomy. Carotid stenosis measurements (when applicable) are obtained utilizing NASCET criteria, using the distal internal carotid diameter as the denominator. CONTRAST:  50 mL Isovue 370 COMPARISON:  MRI brain 01/19/2017. FINDINGS: CT HEAD FINDINGS Brain: No acute infarct, hemorrhage, or mass lesion is present. Ventricles are normal size. No significant extra-axial fluid collection is present. Vascular: Atherosclerotic calcifications are present within the cavernous internal carotid artery is. There is no hyperdense vessel. Skull: The calvarium is intact. Sinuses: Minimal fluid is present in the posterior left ethmoid air cell and medial left sphenoid sinus. Chronic right maxillary sinus disease is present. Orbits: Bilateral lens replacements are present. The globes and orbits are otherwise within normal limits. Review of the MIP images confirms the above findings CTA NECK FINDINGS Aortic arch: A 3 vessel arch configuration is present. Atherosclerotic calcifications are present at the arch and origins of the great vessels without significant proximal stenosis. Right carotid system: The right common carotid artery demonstrates mild tortuosity without significant stenosis. Right carotid endarterectomy is noted. There is no stenosis or occlusion. The cervical right internal carotid artery is normal. Left carotid system: The left common carotid artery is within normal limits. Atherosclerotic calcifications are present at the left carotid bifurcation. There is no significant stenosis relative to the more distal vessel. The remainder of the left internal carotid artery is within normal limits. Vertebral arteries: Atherosclerotic calcifications are present at the origins of the vertebral arteries bilaterally. There is a moderate to high-grade stenosis proximally on the right. No tandem stenoses are  present in either vertebral artery. The left vertebral artery is the dominant vessel. Skeleton: Leftward curvature is present in the cervical spine. There is fusion at C5-6 level. Chronic endplate changes present C6-7. No focal lytic or blastic lesions are present. The patient is edentulous. Other neck: Gas is present about the right carotid sheath following endarterectomy. The soft tissues of the neck are otherwise unremarkable. Upper chest: Mild dependent atelectasis is present the lung apices bilaterally. Review of the MIP images confirms the above findings CTA HEAD FINDINGS Anterior circulation: Dense calcifications are present within the cavernous internal carotid artery is bilaterally. There are moderate stenoses bilaterally within the paraophthalmic segments. The terminal ICA is normal bilaterally. There is no occlusion. The A1 M1 segments are within normal limits. The MCA bifurcations are intact. ACA and MCA branch vessels are within normal limits. Posterior circulation: The left vertebral artery is the dominant vessel. Atherosclerotic calcifications are present at the dural margin without a significant stenosis. The basilar artery is normal. Both posterior cerebral arteries originate from the basilar tip. The PCA branch vessels are within normal limits bilaterally. Venous sinuses: The dural sinuses are patent. The left transverse sinus is dominant. Cortical veins are unremarkable. Anatomic variants: None Delayed phase: No pathologic enhancement is present. Review of the MIP images confirms the above findings IMPRESSION: 1. Right carotid endarterectomy without evidence for acute stenosis or occlusion. The right carotid artery is widely patent. 2. Atherosclerotic changes of the left carotid bifurcation without significant stenosis. 3. Circumferential calcification within the cavernous internal carotid arteries bilaterally with moderate bilateral stenosis of the paraophthalmic segment relative to the more  distal terminal ICA. 4. Distal segmental narrowing and small  vessel disease within the ACA and MCA branch vessels without a significant proximal stenosis, aneurysm, or branch vessel occlusion otherwise. These results were discussed in person at the time of interpretation on 02/05/2017 at 12:45 pm with Dr. Ruta Hinds , who verbally acknowledged these results. Electronically Signed   By: San Morelle M.D.   On: 02/05/2017 13:37       Assessment and plan per attending neurologist  Etta Quill PA-C [facility] [phone]  02/05/2017, 1:59 PM   Assessment/Plan:  This is a 71 year old female status post right CEA. Postsurgical procedure patient was extremely drowsy and difficult to awaken. Patient underwent a stat CTA of head and neck which was negative. Upon returning patient had improvement in mentation was more awake. At this time exam is nonfocal. Patient is bradycardic and has a low blood pressure with her systolic blood pressure at 893 and diastolic in the 73S. The etiology of her significant sedation could be multifactorial including hypotension and slow clearing of anesthetics.   NEUROHOSPITALIST ADDENDUM Seen and examined the patient this AM.  I was called by the vascular surgery team for concern the patient was lethargic following right CEA. The operation was uneventful however the patient was still not waking up. No focal deficits were apparently noted A stat CTA head was ordered.  I examined the patient shortly after around 1:30 PM when the patient her return from her CTA. At that time the patient was awake, alert, oriented 3, aware why she was in the hospital. She was following all commands and moving all 4 extremities equally. She had no cranial nerve deficits, visual fields were intact and no neglect. Her blood pressure is above 287 systolic. The nurse appointment the patient was bradycardic shortly before.   I reviewed the CT head and neck, and right  carotid RCA was performed was patent. Left carotid showed no significant stenosis. She does have bilateral ICA stenosis of moderate degree and potentially more susceptible to hypo-tension.  Also be that the patient is taking longer to arouse from sedation I recommend maintaining her blood pressures 681 -157 systolic range. I do not feel there is any need for performing an MRI given that she has had no focal deficits and she slowly returning to baseline.   Karena Addison Aroor MD [facility] [phone]  If 7pm to 7am, please call on call as listed on AMION.

## 2017-02-05 NOTE — Progress Notes (Signed)
Received pt from previous RN, pt HR 40s-50s, BP sys 95-100. Pt Transported form PACU 1500. Pt sleeping unable to follow command, reporting RN reported MD/Fields aware and no new orders received at present. Nursing will cont to monitor.

## 2017-02-06 ENCOUNTER — Encounter (HOSPITAL_COMMUNITY): Payer: Self-pay | Admitting: Vascular Surgery

## 2017-02-06 LAB — GLUCOSE, CAPILLARY
GLUCOSE-CAPILLARY: 272 mg/dL — AB (ref 65–99)
GLUCOSE-CAPILLARY: 394 mg/dL — AB (ref 65–99)
GLUCOSE-CAPILLARY: 345 mg/dL — AB (ref 65–99)
Glucose-Capillary: 244 mg/dL — ABNORMAL HIGH (ref 65–99)
Glucose-Capillary: 416 mg/dL — ABNORMAL HIGH (ref 65–99)

## 2017-02-06 LAB — BASIC METABOLIC PANEL
ANION GAP: 5 (ref 5–15)
BUN: 17 mg/dL (ref 6–20)
CALCIUM: 9.2 mg/dL (ref 8.9–10.3)
CHLORIDE: 108 mmol/L (ref 101–111)
CO2: 25 mmol/L (ref 22–32)
CREATININE: 1.39 mg/dL — AB (ref 0.44–1.00)
GFR, EST AFRICAN AMERICAN: 43 mL/min — AB (ref >60)
GFR, EST NON AFRICAN AMERICAN: 37 mL/min — AB (ref >60)
Glucose, Bld: 252 mg/dL — ABNORMAL HIGH (ref 65–99)
POTASSIUM: 4.6 mmol/L (ref 3.5–5.1)
SODIUM: 138 mmol/L (ref 135–145)

## 2017-02-06 LAB — CBC
HCT: 28.3 % — ABNORMAL LOW (ref 36.0–46.0)
Hemoglobin: 9.8 g/dL — ABNORMAL LOW (ref 12.0–15.0)
MCH: 27.4 pg (ref 26.0–34.0)
MCHC: 34.6 g/dL (ref 30.0–36.0)
MCV: 79.1 fL (ref 78.0–100.0)
PLATELETS: 113 10*3/uL — AB (ref 150–400)
RBC: 3.58 MIL/uL — AB (ref 3.87–5.11)
RDW: 15.2 % (ref 11.5–15.5)
WBC: 4.6 10*3/uL (ref 4.0–10.5)

## 2017-02-06 LAB — POCT I-STAT 4, (NA,K, GLUC, HGB,HCT)
Glucose, Bld: 187 mg/dL — ABNORMAL HIGH (ref 65–99)
HCT: 28 % — ABNORMAL LOW (ref 36.0–46.0)
HEMOGLOBIN: 9.5 g/dL — AB (ref 12.0–15.0)
POTASSIUM: 3.6 mmol/L (ref 3.5–5.1)
SODIUM: 140 mmol/L (ref 135–145)

## 2017-02-06 MED ORDER — HYDROCODONE-ACETAMINOPHEN 5-325 MG PO TABS: 1.0000 | ORAL_TABLET | ORAL | 0 refills | Status: DC | PRN

## 2017-02-06 NOTE — Progress Notes (Addendum)
[facility]  Subjective  - Doing much better.   Objective (!) 159/70 (!) 53 98.6 F (37 C) (Oral) 18 97%  Intake/Output Summary (Last 24 hours) at 02/06/17 1287 Last data filed at 02/06/17 8676  Gross per 24 hour  Intake          2938.33 ml  Output             1950 ml  Net           988.33 ml    No tongue deviation and smile is symmetric Incision is healing well without hematoma B UE grip 5/5 and sensation intact   Assessment/Planning: POD # 1 right CEA  Stable disposition for discharge once she has tolerated breakfast and ambulated.  She has independently voided.  F/U in 2 weeks with Dr. Carvel Getting, EMMA [facility] 02/06/2017 7:12 AM -- Still feels weak and dizzy when standing.  Will keep one more day.  Will work on mobility today  Ruta Hinds, MD [facility] [place]: [phone] Pager: [phone]  Laboratory Lab Results:  Recent Labs  02/05/17 1452 02/06/17 0446  WBC 6.5 4.6  HGB 11.4* 9.8*  HCT 32.9* 28.3*  PLT 153 PENDING   BMET  Recent Labs  02/05/17 1452 02/06/17 0446  NA  --  138  K  --  4.6  CL  --  108  CO2  --  25  GLUCOSE  --  252*  BUN  --  17  CREATININE 1.38* 1.39*  CALCIUM  --  9.2    COAG Lab Results  Component Value Date   INR 1.28 02/01/2017   INR 1.13 01/19/2017   INR 1.52 (H) 03/12/2014   No results found for: PTT

## 2017-02-06 NOTE — Progress Notes (Signed)
Subjective: No complaints  Exam: Vitals:   02/06/17 0500 02/06/17 0830  BP: (!) 159/70 (!) 125/48  Pulse: (!) 53 (!) 54  Resp: 18 14  Temp:  98.7 F (37.1 C)  SpO2: 97% 98%    HEENT-  Normocephalic, no lesions, without obvious abnormality.  Normal external eye and conjunctiva.  Normal TM's bilaterally.  Normal auditory canals and external ears. Normal external nose, mucus membranes and septum.  Normal pharynx.    Neuro:  CN: Pupils are equal and round. They are symmetrically reactive from 3-->2 mm. EOMI without nystagmus. Facial sensation is intact to light touch. Face is symmetric at rest with normal strength and mobility. Hearing is intact to conversational voice. Palate elevates symmetrically and uvula is midline. Voice is normal in tone, pitch and quality. Bilateral SCM and trapezii are 5/5. Tongue is midline with normal bulk and mobility.  Motor: Normal bulk, tone, and strength. 5/5 throughout. No drift.  Sensation: Intact to light touch.      Pertinent Labs/Diagnostics: none  Etta Quill PA-C [facility] [phone]  Impression:  This is a 71 year old female status post right CEA. Postsurgical procedure patient was extremely drowsy and difficult to awaken. Back to baseline  Recommendations: 1) no further recommendations. Neurology will S/O    02/06/2017, 10:19 AM

## 2017-02-06 NOTE — Discharge Instructions (Signed)
° °  [facility]  Discharge Instructions   Carotid Endarterectomy (CEA)  Please refer to the following instructions for your post-procedure care. Your surgeon or physician assistant will discuss any changes with you.  Activity  You are encouraged to walk as much as you can. You can slowly return to normal activities but must avoid strenuous activity and heavy lifting until your doctor tell you it's OK. Avoid activities such as vacuuming or swinging a golf club. You can drive after one week if you are comfortable and you are no longer taking prescription pain medications. It is normal to feel tired for serval weeks after your surgery. It is also normal to have difficulty with sleep habits, eating, and bowel movements after surgery. These will go away with time.  Bathing/Showering  You may shower after you go home. Do not soak in a bathtub, hot tub, or swim until the incision heals completely.  Incision Care  Shower every day. Clean your incision with mild soap and water. Pat the area dry with a clean towel. You do not need a bandage unless otherwise instructed. Do not apply any ointments or creams to your incision. You may have skin glue on your incision. Do not peel it off. It will come off on its own in about one week. Your incision may feel thickened and raised for several weeks after your surgery. This is normal and the skin will soften over time. For Men Only: It's OK to shave around the incision but do not shave the incision itself for 2 weeks. It is common to have numbness under your chin that could last for several months.  Diet  Resume your normal diet. There are no special food restrictions following this procedure. A low fat/low cholesterol diet is recommended for all patients with vascular disease. In order to heal from your surgery, it is CRITICAL to get adequate nutrition. Your body requires vitamins, minerals, and protein. Vegetables are the best  source of vitamins and minerals. Vegetables also provide the perfect balance of protein. Processed food has little nutritional value, so try to avoid this.  Medications  Resume taking all of your medications unless your doctor or physician assistant tells you not to. If your incision is causing pain, you may take over-the- counter pain relievers such as acetaminophen (Tylenol). If you were prescribed a stronger pain medication, please be aware these medications can cause nausea and constipation. Prevent nausea by taking the medication with a snack or meal. Avoid constipation by drinking plenty of fluids and eating foods with a high amount of fiber, such as fruits, vegetables, and grains. Do not take Tylenol if you are taking prescription pain medications.  Follow Up  Our office will schedule a follow up appointment 2-3 weeks following discharge.  Please call us immediately for any of the following conditions  Increased pain, redness, drainage (pus) from your incision site. Fever of 101 degrees or higher. If you should develop stroke (slurred speech, difficulty swallowing, weakness on one side of your body, loss of vision) you should call 911 and go to the nearest emergency room.  Reduce your risk of vascular disease:  Stop smoking. If you would like help call [facility] at 1-800-QUIT-NOW 435-275-2135) or [place] at [phone]. Manage your cholesterol Maintain a desired weight Control your diabetes Keep your blood pressure down  If you have any questions, please call the office at [phone].

## 2017-02-06 NOTE — Care Management Note (Signed)
Case Management Note Marvetta Gibbons RN, BSN Unit 4E-Case Manager [phone]  Patient Details  Name: Cassandra Adams MRN: [id number] Date of Birth: December 24, 1945  Subjective/Objective:   Pt admitted s/p CEA                 Action/Plan: PTA pt lived at home was active with [facility] for HHRN/PT- orders to resume [facility] services received per verbal order from DR. Fields- notified Santiago Glad with [facility] for resumption of services on discharge- per DR. Fields- pt will d/c 8/15  Expected Discharge Date:  02/07/17               Expected Discharge Plan:  [place]  In-House Referral:     Discharge planning Services  CM Consult  Post Acute Care Choice:  [place], Resumption of Svcs/PTA Provider Choice offered to:  Patient  DME Arranged:    DME Agency:     HH Arranged:  RN, PT [place] Agency:  [place]  Status of Service:  Completed, signed off  If discussed at [place] of Stay Meetings, dates discussed:    Discharge Disposition: home/home health   Additional Comments:  Dawayne Patricia, RN 02/06/2017, 9:46 AM

## 2017-02-07 ENCOUNTER — Telehealth: Payer: Self-pay | Admitting: Vascular Surgery

## 2017-02-07 LAB — GLUCOSE, CAPILLARY
GLUCOSE-CAPILLARY: 290 mg/dL — AB (ref 65–99)
Glucose-Capillary: 287 mg/dL — ABNORMAL HIGH (ref 65–99)

## 2017-02-07 LAB — HEMOGLOBIN A1C
Hgb A1c MFr Bld: 8.9 % — ABNORMAL HIGH (ref 4.8–5.6)
MEAN PLASMA GLUCOSE: 209 mg/dL

## 2017-02-07 NOTE — Progress Notes (Addendum)
  Progress Note  SUBJECTIVE:    POD #2  Walked last night and said legs still feel week. Denies dizziness today.   OBJECTIVE:   Vitals:   02/07/17 0400 02/07/17 0807  BP: (!) 130/50 (!) 154/83  Pulse: (!) 57 60  Resp: 11 13  Temp: 98.5 F (36.9 C) 98.4 F (36.9 C)  SpO2: 95% 99%    Intake/Output Summary (Last 24 hours) at 02/07/17 0837 Last data filed at 02/06/17 1800  Gross per 24 hour  Intake              120 ml  Output              200 ml  Net              -80 ml   Right neck without hematoma Tongue midline, no facial droop 5/5 strength upper and lower extremities bilaterally.   ASSESSMENT/PLAN:   71 y.o. female is s/p: right carotid endarterectomy 2 Days Post-Op   Neuro intact this am. Felt like legs were still weak last night. Will have RN ambulate with patient this morning.  If ambulating ok, d/c home.   Alvia [place] 02/07/2017 8:37 AM --  Moving around ok.  Neuro at baseline.  No hematoma D/c home  Ruta Hinds, MD [facility] [place] Office: [phone] Pager: [phone]  LABS:   CBC    Component Value Date/Time   WBC 4.6 02/06/2017 0446   HGB 9.8 (L) 02/06/2017 0446   HCT 28.3 (L) 02/06/2017 0446   PLT 113 (L) 02/06/2017 0446    BMET    Component Value Date/Time   NA 138 02/06/2017 0446   K 4.6 02/06/2017 0446   CL 108 02/06/2017 0446   CO2 25 02/06/2017 0446   GLUCOSE 252 (H) 02/06/2017 0446   BUN 17 02/06/2017 0446   CREATININE 1.39 (H) 02/06/2017 0446   CREATININE 1.40 (H) 07/01/2015 0926   CALCIUM 9.2 02/06/2017 0446   GFRNONAA 37 (L) 02/06/2017 0446   GFRAA 43 (L) 02/06/2017 0446    COAG Lab Results  Component Value Date   INR 1.28 02/01/2017   INR 1.13 01/19/2017   INR 1.52 (H) 03/12/2014   No results found for: PTT  ANTIBIOTICS:   Anti-infectives    Start     Dose/Rate Route Frequency Ordered Stop   02/05/17 0630  vancomycin (VANCOCIN) IVPB 1000 mg/200 mL premix     1,000  mg 200 mL/hr over 60 Minutes Intravenous To [facility] 02/02/17 1258 02/05/17 [place], PA-C [facility] Office: [phone] Pager: [phone] 02/07/2017 8:37 AM

## 2017-02-07 NOTE — Telephone Encounter (Signed)
-----   Message from Mena Goes, RN sent at 02/06/2017  8:46 AM EDT ----- Regarding: 2-3 weeks post CEA   ----- Message ----- From: Ulyses Amor, PA-C Sent: 02/06/2017   7:18 AM To: Vvs Charge Pool  F/U with Dr. Oneida Alar in 2-3 weeks s/p right CEA

## 2017-02-07 NOTE — Progress Notes (Signed)
Late entry : assumed care from off going RN from night shift; patient alert & oriented; no signs of acute distress; patient ambulated in hall wall with no c/o's dizziness or CP or weakness; patient given discharge instructions; MD round; patient being discharge

## 2017-02-07 NOTE — Telephone Encounter (Signed)
unable to LVM no VM set up, mailed letter for appt 9/6 to home address

## 2017-02-08 DIAGNOSIS — Z7901 Long term (current) use of anticoagulants: Secondary | ICD-10-CM | POA: Diagnosis not present

## 2017-02-08 DIAGNOSIS — I10 Essential (primary) hypertension: Secondary | ICD-10-CM | POA: Diagnosis not present

## 2017-02-08 DIAGNOSIS — Z7982 Long term (current) use of aspirin: Secondary | ICD-10-CM | POA: Diagnosis not present

## 2017-02-08 DIAGNOSIS — I480 Paroxysmal atrial fibrillation: Secondary | ICD-10-CM | POA: Diagnosis not present

## 2017-02-08 DIAGNOSIS — E1165 Type 2 diabetes mellitus with hyperglycemia: Secondary | ICD-10-CM | POA: Diagnosis not present

## 2017-02-08 DIAGNOSIS — Z794 Long term (current) use of insulin: Secondary | ICD-10-CM | POA: Diagnosis not present

## 2017-02-08 DIAGNOSIS — M6281 Muscle weakness (generalized): Secondary | ICD-10-CM | POA: Diagnosis not present

## 2017-02-08 NOTE — Discharge Summary (Signed)
[facility] Discharge Summary  Cassandra Adams 09-Apr-1946 71 y.o. female  [id number]  Admission Date: 02/05/2017  Discharge Date: 02/07/2017  Physician: Ruta Hinds, MD  Admission Diagnosis: Right Internal Carotid Stenosis I65.21  HPI:   This is a 71 y.o. female who presented for evaluation of a symptomatic right internal carotid artery stenosis. The patient was recently seen at [facility] after suffering a TIA. This affected her left arm and left leg. She had weakness in both areas. This lasted for several hours and then completely resolved. She denies any prior symptoms of TIA amaurosis or stroke but has been followed by our office in the past for mild carotid stenosis. She was last seen in 2015. She currently is on aspirin and Plavix and Eliquis. However her records states that she is only supposed to be on aspirin and Eliquis. Other medical problems include coronary artery disease, CK D3, hypertension which are been stable. She also has diabetes. She is on a statin.  She is on Eliquis for paroxysmal atrial fibrillation.  Hospital Course:  The patient was admitted to the hospital and taken to the operating room on 02/05/2017 and underwent right carotid endarterectomy.  The patient tolerated the procedure well and was transported to the PACU in stable condition.  By POD 1, the patient's neuro status was intact. Neck incision without hematoma. She did have some weakness and dizziness with standing. She kept in the hospital one more day for mobility. She was tolerating a diet without difficulty.   By POD 2, she was ambulating better. Weakness and dizziness had improved. She remained neuro intact.     Recent Labs  02/06/17 0446  NA 138  K 4.6  CL 108  CO2 25  GLUCOSE 252*  BUN 17  CALCIUM 9.2    Recent Labs  02/06/17 0446  WBC 4.6  HGB 9.8*  HCT 28.3*  PLT 113*   No results for input(s): INR in the last 72 hours.  Discharge  Instructions:   The patient is discharged to home with extensive instructions on wound care and progressive ambulation.  They are instructed not to drive or perform any heavy lifting until returning to see the physician in his office.  Discharge Instructions    Call MD for:  redness, tenderness, or signs of infection (pain, swelling, bleeding, redness, odor or green/yellow discharge around incision site)    Complete by:  As directed    Call MD for:  severe or increased pain, loss or decreased feeling  in affected limb(s)    Complete by:  As directed    Call MD for:  temperature >100.5    Complete by:  As directed    Driving Restrictions    Complete by:  As directed    No driving for 1 week   Increase activity slowly    Complete by:  As directed    Walk with assistance use walker or cane as needed   Lifting restrictions    Complete by:  As directed    No heavy lifting for 3 weeks   Resume previous diet    Complete by:  As directed       Discharge Diagnosis:  Right Internal Carotid Stenosis I65.21  Secondary Diagnosis: Patient Active Problem List   Diagnosis Date Noted  . Carotid stenosis 02/05/2017  . TIA (transient ischemic attack) 01/20/2017  . Carotid stenosis, right 01/20/2017  . Diabetic hyperosmolar non-ketotic state ([place]) 01/19/2017  . AKI (acute kidney injury) (  [place]) 01/19/2017  . Left leg weakness 01/19/2017  . Dyslipidemia 11/24/2016  . Edema 03/30/2016  . Diaphoresis 02/18/2016  . Hypoglycemia 02/18/2016  . Bradycardia 04/22/2015  . CAD (coronary artery disease) 02/26/2014  . PAF (paroxysmal atrial fibrillation) ([place]) 02/26/2014  . NSTEMI (non-ST elevated myocardial infarction) ([place]) 01/03/2014  . Occlusion and stenosis of carotid artery without mention of cerebral infarction 11/21/2013  . Pain in limb-Left neck 11/21/2013  . Carotid stenosis, bilateral 11/17/2011  . HYPERTHYROIDISM, SUBCLINICAL 02/15/2010  . GERD 02/15/2010  . DYSPHAGIA UNSPECIFIED  02/15/2010  . ABDOMINAL PAIN, UNSPECIFIED SITE 02/15/2010  . Controlled insulin-dependent diabetes mellitus with neuropathy ([place]) 02/11/2010  . Essential hypertension 02/11/2010  . HEADACHE 02/11/2010   Past Medical History:  Diagnosis Date  . Arthritis    "all over"  . CAD (coronary artery disease)    a. NSTEMI 12/2013 - occluded dominant RCA with L-R collaterals, moderate prox segmental LAD disease, for medical therapy initially, EF 60% with subtle inferobasal hypokinesia. Consider PCI for refractory CP.  [name] CKD (chronic kidney disease), stage III   . Hypertension   . Migraines    "weekly" (01/06/2014)  . Sickle cell trait ([place])   . TIA (transient ischemic attack) 1978  . Type II diabetes mellitus ([place])   . Vertigo     Allergies as of 02/07/2017      Reactions   Penicillins Hives   Has patient had a PCN reaction causing immediate rash, facial/tongue/throat swelling, SOB or lightheadedness with hypotension: no Has patient had a PCN reaction causing severe rash involving mucus membranes or skin necrosis: No  Has patient had a PCN reaction that required hospitalization: no Has patient had a PCN reaction occurring within the last 10 years: no If all of the above answers are "NO", then may proceed with Cephalosporin use.      Medication List    TAKE these medications   ACCU-CHEK AVIVA PLUS test strip Generic drug:  glucose blood   ACCU-CHEK SOFTCLIX LANCETS lancets   amLODipine 10 MG tablet Commonly known as:  NORVASC Take 5 mg by mouth daily.   apixaban 5 MG Tabs tablet Commonly known as:  ELIQUIS Take 1 tablet (5 mg total) by mouth 2 (two) times daily.   aspirin 81 MG chewable tablet Chew 81 mg by mouth daily.   atorvastatin 80 MG tablet Commonly known as:  LIPITOR Take 1 tablet (80 mg total) by mouth every evening. What changed:  when to take this   clotrimazole-betamethasone cream Commonly known as:  LOTRISONE Apply 1 application topically 3 (three) times daily.  foot   colesevelam 625 MG tablet Commonly known as:  WELCHOL Take 1,875 mg by mouth 2 (two) times daily with a meal. *May take 6 tablets once a day with meals*   diazepam 2 MG tablet Commonly known as:  VALIUM Take 1 tablet (2 mg total) by mouth 2 (two) times daily.   furosemide 40 MG tablet Commonly known as:  LASIX Take 1 tablet (40 mg total) by mouth every morning. What changed:  when to take this   glimepiride 2 MG tablet Commonly known as:  AMARYL Take 6 mg by mouth daily with breakfast.   HUMALOG 100 UNIT/ML injection Generic drug:  insulin lispro Inject 1-10 Units into the skin 3 (three) times daily with meals. Per sliding scale   HYDROcodone-acetaminophen 5-325 MG tablet Commonly known as:  NORCO/VICODIN Take 1 tablet by mouth every 4 (four) hours as needed (migraines). What changed:  Another medication  with the same name was added. Make sure you understand how and when to take each.   HYDROcodone-acetaminophen 5-325 MG tablet Commonly known as:  NORCO/VICODIN Take 1 tablet by mouth every 4 (four) hours as needed (migraines). What changed:  You were already taking a medication with the same name, and this prescription was added. Make sure you understand how and when to take each.   insulin glargine 100 UNIT/ML injection Commonly known as:  LANTUS Inject 15 Units into the skin at bedtime.   lisinopril 10 MG tablet Commonly known as:  PRINIVIL,ZESTRIL Take 10 mg by mouth daily.   meclizine 12.5 MG tablet Commonly known as:  ANTIVERT Take 1 tablet (12.5 mg total) by mouth 3 (three) times daily as needed for dizziness.   nitroGLYCERIN 0.4 MG SL tablet Commonly known as:  NITROSTAT Place 1 tablet (0.4 mg total) under the tongue every 5 (five) minutes as needed for chest pain (up to 3 doses).   tiZANidine 4 MG tablet Commonly known as:  ZANAFLEX Take 4 mg by mouth at bedtime.       Vicodin #6 No Refill  Disposition: Home  Patient's condition: is  Good  Follow up: 1. Dr.  Oneida Alar in 2 weeks.   Virgina Jock, PA-C [facility] [phone]  --- For [facility] use --- Instructions: Press F2 to tab through selections.  Delete question if not applicable.   Modified Rankin score at D/C (0-6): 0  IV medication needed for:  1. Hypertension: No 2. Hypotension: No  Post-op Complications: No  1. Post-op CVA or TIA: No  2. CN injury: No  3. Myocardial infarction: No  4.  CHF: No  5.  Dysrhythmia (new): No  6. Wound infection: No  7. Reperfusion symptoms: No  8. Return to OR: No  Discharge medications: Statin use:  Yes If No: [ ]  For Medical reasons, [ ]  Non-compliant, [ ]  Not-indicated ASA use:  Yes  If No: [ ]  For Medical reasons, [ ]  Non-compliant, [ ]  Not-indicated Beta blocker use:  No If No: [ ]  For Medical reasons, [ ]  Non-compliant, [x ] Not-indicated ACE-Inhibitor use:  Yes If No: [ ]  For Medical reasons, [ ]  Non-compliant, [ ]  Not-indicated P2Y12 Antagonist use: No, [ ]  Plavix, [ ]  Plasugrel, [ ]  Ticlopinine, [ ]  Ticagrelor, [ ]  Other, [ ]  No for medical reason, [ ]  Non-compliant, [ ]  Not-indicated Anti-coagulant use:  Yes, [ ]  Warfarin, [ ]  Rivaroxaban, [ ]  Dabigatran, [x ] Other apixaban, [ ]  No for medical reason, [ ]  Non-compliant, [ ]  Not-indicated

## 2017-02-09 DIAGNOSIS — Z794 Long term (current) use of insulin: Secondary | ICD-10-CM | POA: Diagnosis not present

## 2017-02-09 DIAGNOSIS — Z7982 Long term (current) use of aspirin: Secondary | ICD-10-CM | POA: Diagnosis not present

## 2017-02-09 DIAGNOSIS — Z7901 Long term (current) use of anticoagulants: Secondary | ICD-10-CM | POA: Diagnosis not present

## 2017-02-09 DIAGNOSIS — E1165 Type 2 diabetes mellitus with hyperglycemia: Secondary | ICD-10-CM | POA: Diagnosis not present

## 2017-02-09 DIAGNOSIS — M6281 Muscle weakness (generalized): Secondary | ICD-10-CM | POA: Diagnosis not present

## 2017-02-09 DIAGNOSIS — I10 Essential (primary) hypertension: Secondary | ICD-10-CM | POA: Diagnosis not present

## 2017-02-09 DIAGNOSIS — I480 Paroxysmal atrial fibrillation: Secondary | ICD-10-CM | POA: Diagnosis not present

## 2017-02-12 DIAGNOSIS — Z7901 Long term (current) use of anticoagulants: Secondary | ICD-10-CM | POA: Diagnosis not present

## 2017-02-12 DIAGNOSIS — I10 Essential (primary) hypertension: Secondary | ICD-10-CM | POA: Diagnosis not present

## 2017-02-12 DIAGNOSIS — Z7982 Long term (current) use of aspirin: Secondary | ICD-10-CM | POA: Diagnosis not present

## 2017-02-12 DIAGNOSIS — I480 Paroxysmal atrial fibrillation: Secondary | ICD-10-CM | POA: Diagnosis not present

## 2017-02-12 DIAGNOSIS — E1165 Type 2 diabetes mellitus with hyperglycemia: Secondary | ICD-10-CM | POA: Diagnosis not present

## 2017-02-12 DIAGNOSIS — M6281 Muscle weakness (generalized): Secondary | ICD-10-CM | POA: Diagnosis not present

## 2017-02-12 DIAGNOSIS — Z794 Long term (current) use of insulin: Secondary | ICD-10-CM | POA: Diagnosis not present

## 2017-02-13 DIAGNOSIS — E1165 Type 2 diabetes mellitus with hyperglycemia: Secondary | ICD-10-CM | POA: Diagnosis not present

## 2017-02-13 DIAGNOSIS — M6281 Muscle weakness (generalized): Secondary | ICD-10-CM | POA: Diagnosis not present

## 2017-02-13 DIAGNOSIS — I10 Essential (primary) hypertension: Secondary | ICD-10-CM | POA: Diagnosis not present

## 2017-02-13 DIAGNOSIS — Z7901 Long term (current) use of anticoagulants: Secondary | ICD-10-CM | POA: Diagnosis not present

## 2017-02-13 DIAGNOSIS — Z7982 Long term (current) use of aspirin: Secondary | ICD-10-CM | POA: Diagnosis not present

## 2017-02-13 DIAGNOSIS — I480 Paroxysmal atrial fibrillation: Secondary | ICD-10-CM | POA: Diagnosis not present

## 2017-02-13 DIAGNOSIS — Z794 Long term (current) use of insulin: Secondary | ICD-10-CM | POA: Diagnosis not present

## 2017-02-14 DIAGNOSIS — Z7901 Long term (current) use of anticoagulants: Secondary | ICD-10-CM | POA: Diagnosis not present

## 2017-02-14 DIAGNOSIS — M6281 Muscle weakness (generalized): Secondary | ICD-10-CM | POA: Diagnosis not present

## 2017-02-14 DIAGNOSIS — I10 Essential (primary) hypertension: Secondary | ICD-10-CM | POA: Diagnosis not present

## 2017-02-14 DIAGNOSIS — E1165 Type 2 diabetes mellitus with hyperglycemia: Secondary | ICD-10-CM | POA: Diagnosis not present

## 2017-02-14 DIAGNOSIS — Z7982 Long term (current) use of aspirin: Secondary | ICD-10-CM | POA: Diagnosis not present

## 2017-02-14 DIAGNOSIS — Z794 Long term (current) use of insulin: Secondary | ICD-10-CM | POA: Diagnosis not present

## 2017-02-14 DIAGNOSIS — I480 Paroxysmal atrial fibrillation: Secondary | ICD-10-CM | POA: Diagnosis not present

## 2017-02-15 DIAGNOSIS — M6281 Muscle weakness (generalized): Secondary | ICD-10-CM | POA: Diagnosis not present

## 2017-02-15 DIAGNOSIS — I480 Paroxysmal atrial fibrillation: Secondary | ICD-10-CM | POA: Diagnosis not present

## 2017-02-15 DIAGNOSIS — Z794 Long term (current) use of insulin: Secondary | ICD-10-CM | POA: Diagnosis not present

## 2017-02-15 DIAGNOSIS — I10 Essential (primary) hypertension: Secondary | ICD-10-CM | POA: Diagnosis not present

## 2017-02-15 DIAGNOSIS — Z7982 Long term (current) use of aspirin: Secondary | ICD-10-CM | POA: Diagnosis not present

## 2017-02-15 DIAGNOSIS — E1165 Type 2 diabetes mellitus with hyperglycemia: Secondary | ICD-10-CM | POA: Diagnosis not present

## 2017-02-15 DIAGNOSIS — Z7901 Long term (current) use of anticoagulants: Secondary | ICD-10-CM | POA: Diagnosis not present

## 2017-02-21 ENCOUNTER — Encounter: Payer: Self-pay | Admitting: Vascular Surgery

## 2017-02-21 DIAGNOSIS — I480 Paroxysmal atrial fibrillation: Secondary | ICD-10-CM | POA: Diagnosis not present

## 2017-02-21 DIAGNOSIS — Z7901 Long term (current) use of anticoagulants: Secondary | ICD-10-CM | POA: Diagnosis not present

## 2017-02-21 DIAGNOSIS — Z794 Long term (current) use of insulin: Secondary | ICD-10-CM | POA: Diagnosis not present

## 2017-02-21 DIAGNOSIS — I10 Essential (primary) hypertension: Secondary | ICD-10-CM | POA: Diagnosis not present

## 2017-02-21 DIAGNOSIS — M6281 Muscle weakness (generalized): Secondary | ICD-10-CM | POA: Diagnosis not present

## 2017-02-21 DIAGNOSIS — Z7982 Long term (current) use of aspirin: Secondary | ICD-10-CM | POA: Diagnosis not present

## 2017-02-21 DIAGNOSIS — E1165 Type 2 diabetes mellitus with hyperglycemia: Secondary | ICD-10-CM | POA: Diagnosis not present

## 2017-02-22 DIAGNOSIS — Z7982 Long term (current) use of aspirin: Secondary | ICD-10-CM | POA: Diagnosis not present

## 2017-02-22 DIAGNOSIS — Z794 Long term (current) use of insulin: Secondary | ICD-10-CM | POA: Diagnosis not present

## 2017-02-22 DIAGNOSIS — I10 Essential (primary) hypertension: Secondary | ICD-10-CM | POA: Diagnosis not present

## 2017-02-22 DIAGNOSIS — M6281 Muscle weakness (generalized): Secondary | ICD-10-CM | POA: Diagnosis not present

## 2017-02-22 DIAGNOSIS — Z7901 Long term (current) use of anticoagulants: Secondary | ICD-10-CM | POA: Diagnosis not present

## 2017-02-22 DIAGNOSIS — E1165 Type 2 diabetes mellitus with hyperglycemia: Secondary | ICD-10-CM | POA: Diagnosis not present

## 2017-02-22 DIAGNOSIS — I480 Paroxysmal atrial fibrillation: Secondary | ICD-10-CM | POA: Diagnosis not present

## 2017-02-23 DIAGNOSIS — Z7901 Long term (current) use of anticoagulants: Secondary | ICD-10-CM | POA: Diagnosis not present

## 2017-02-23 DIAGNOSIS — Z7982 Long term (current) use of aspirin: Secondary | ICD-10-CM | POA: Diagnosis not present

## 2017-02-23 DIAGNOSIS — I10 Essential (primary) hypertension: Secondary | ICD-10-CM | POA: Diagnosis not present

## 2017-02-23 DIAGNOSIS — I480 Paroxysmal atrial fibrillation: Secondary | ICD-10-CM | POA: Diagnosis not present

## 2017-02-23 DIAGNOSIS — M6281 Muscle weakness (generalized): Secondary | ICD-10-CM | POA: Diagnosis not present

## 2017-02-23 DIAGNOSIS — E1165 Type 2 diabetes mellitus with hyperglycemia: Secondary | ICD-10-CM | POA: Diagnosis not present

## 2017-02-23 DIAGNOSIS — Z794 Long term (current) use of insulin: Secondary | ICD-10-CM | POA: Diagnosis not present

## 2017-03-01 ENCOUNTER — Ambulatory Visit (INDEPENDENT_AMBULATORY_CARE_PROVIDER_SITE_OTHER): Payer: Self-pay | Admitting: Vascular Surgery

## 2017-03-01 ENCOUNTER — Encounter: Payer: Self-pay | Admitting: Vascular Surgery

## 2017-03-01 VITALS — BP 142/64 | HR 96 | Temp 98.0°F | Ht 66.0 in | Wt 195.0 lb

## 2017-03-01 DIAGNOSIS — I6521 Occlusion and stenosis of right carotid artery: Secondary | ICD-10-CM

## 2017-03-01 NOTE — Progress Notes (Signed)
Patient is a 71 year old female who returns for postoperative follow-up today. She underwent right carotid endarterectomy on 02/05/2017. She was fairly slow to wake up in the recovery room but eventually returned to neurologic Baseline. She states she has had no neurologic events since discharge from the hospital. She has essentially return to her baseline function. She denies any incisional drainage. She has had no episodes of weakness numbness tingling or slurred speech.  Physical exam:  Vitals:   03/01/17 1138  BP: (!) 142/64  Pulse: 96  Temp: 98 F (36.7 C)  TempSrc: Oral  SpO2: 97%  Weight: 195 lb (88.5 kg)  Height: 5\' 6"  (1.676 m)    Neck: Right neck incision well-healed no drainage no erythema  Neuro: Symmetric upper and lower extremity motor strength which is 5 over 5 no facial asymmetry  Assessment: Doing well status post right carotid endarterectomy he  Plan: Follow-up 6 months with our nurse practitioner with a carotid duplex exam at that time  Ruta Hinds, MD [facility] [place] Office: [phone] Pager: [phone]

## 2017-03-02 DIAGNOSIS — I480 Paroxysmal atrial fibrillation: Secondary | ICD-10-CM | POA: Diagnosis not present

## 2017-03-02 DIAGNOSIS — I10 Essential (primary) hypertension: Secondary | ICD-10-CM | POA: Diagnosis not present

## 2017-03-02 DIAGNOSIS — Z7901 Long term (current) use of anticoagulants: Secondary | ICD-10-CM | POA: Diagnosis not present

## 2017-03-02 DIAGNOSIS — M6281 Muscle weakness (generalized): Secondary | ICD-10-CM | POA: Diagnosis not present

## 2017-03-02 DIAGNOSIS — Z7982 Long term (current) use of aspirin: Secondary | ICD-10-CM | POA: Diagnosis not present

## 2017-03-02 DIAGNOSIS — Z794 Long term (current) use of insulin: Secondary | ICD-10-CM | POA: Diagnosis not present

## 2017-03-02 DIAGNOSIS — E1165 Type 2 diabetes mellitus with hyperglycemia: Secondary | ICD-10-CM | POA: Diagnosis not present

## 2017-03-02 NOTE — Addendum Note (Signed)
Addended by: Lianne Cure A on: 03/02/2017 10:50 AM   Modules accepted: Orders

## 2017-05-08 DIAGNOSIS — N183 Chronic kidney disease, stage 3 (moderate): Secondary | ICD-10-CM | POA: Diagnosis not present

## 2017-05-08 DIAGNOSIS — Z23 Encounter for immunization: Secondary | ICD-10-CM | POA: Diagnosis not present

## 2017-05-08 DIAGNOSIS — I10 Essential (primary) hypertension: Secondary | ICD-10-CM | POA: Diagnosis not present

## 2017-05-08 DIAGNOSIS — Z1389 Encounter for screening for other disorder: Secondary | ICD-10-CM | POA: Diagnosis not present

## 2017-05-08 DIAGNOSIS — I2510 Atherosclerotic heart disease of native coronary artery without angina pectoris: Secondary | ICD-10-CM | POA: Diagnosis not present

## 2017-05-08 DIAGNOSIS — E1151 Type 2 diabetes mellitus with diabetic peripheral angiopathy without gangrene: Secondary | ICD-10-CM | POA: Diagnosis not present

## 2017-05-08 DIAGNOSIS — I7389 Other specified peripheral vascular diseases: Secondary | ICD-10-CM | POA: Diagnosis not present

## 2017-06-11 ENCOUNTER — Ambulatory Visit (INDEPENDENT_AMBULATORY_CARE_PROVIDER_SITE_OTHER): Payer: Medicare Other | Admitting: "Endocrinology

## 2017-06-11 ENCOUNTER — Encounter: Payer: Self-pay | Admitting: "Endocrinology

## 2017-06-11 VITALS — BP 158/64 | HR 71 | Ht 66.0 in | Wt 181.0 lb

## 2017-06-11 DIAGNOSIS — I10 Essential (primary) hypertension: Secondary | ICD-10-CM

## 2017-06-11 DIAGNOSIS — N183 Chronic kidney disease, stage 3 unspecified: Secondary | ICD-10-CM | POA: Insufficient documentation

## 2017-06-11 DIAGNOSIS — E785 Hyperlipidemia, unspecified: Secondary | ICD-10-CM | POA: Diagnosis not present

## 2017-06-11 DIAGNOSIS — E1169 Type 2 diabetes mellitus with other specified complication: Secondary | ICD-10-CM | POA: Insufficient documentation

## 2017-06-11 DIAGNOSIS — N1831 Chronic kidney disease, stage 3a: Secondary | ICD-10-CM | POA: Insufficient documentation

## 2017-06-11 DIAGNOSIS — E1159 Type 2 diabetes mellitus with other circulatory complications: Secondary | ICD-10-CM

## 2017-06-11 DIAGNOSIS — Z794 Long term (current) use of insulin: Secondary | ICD-10-CM | POA: Diagnosis not present

## 2017-06-11 DIAGNOSIS — N184 Chronic kidney disease, stage 4 (severe): Secondary | ICD-10-CM | POA: Diagnosis not present

## 2017-06-11 DIAGNOSIS — E1122 Type 2 diabetes mellitus with diabetic chronic kidney disease: Secondary | ICD-10-CM | POA: Insufficient documentation

## 2017-06-11 LAB — HM DIABETES EYE EXAM

## 2017-06-11 NOTE — Patient Instructions (Signed)

## 2017-06-11 NOTE — Progress Notes (Signed)
Consult Note       06/11/2017, 9:23 AM   Subjective:    Patient ID: Cassandra Adams, female    DOB: [birth year].  Cassandra Adams is being seen in consultation for management of currently uncontrolled symptomatic diabetes requested by  Redmond School, MD.   Past Medical History:  Diagnosis Date  . Arthritis    "all over"  . CAD (coronary artery disease)    a. NSTEMI 12/2013 - occluded dominant RCA with L-R collaterals, moderate prox segmental LAD disease, for medical therapy initially, EF 60% with subtle inferobasal hypokinesia. Consider PCI for refractory CP.  [name] CKD (chronic kidney disease), stage III ([place])   . Hypertension   . Migraines    "weekly" (01/06/2014)  . Sickle cell trait ([place])   . TIA (transient ischemic attack) 1978  . Type II diabetes mellitus ([place])   . Vertigo    Past Surgical History:  Procedure Laterality Date  . APPENDECTOMY  March 2014   had appendix frozen  . CARDIAC CATHETERIZATION  "years ago" & 01/05/2014  . CARDIOVERSION N/A 03/17/2014   Procedure: CARDIOVERSION;  Surgeon: [name], MD;  Location: [facility] ENDOSCOPY;  Service: Cardiovascular;  Laterality: N/A;  . CATARACT EXTRACTION W/ INTRAOCULAR LENS  IMPLANT, BILATERAL Bilateral 04/2013-05/2013  . ENDARTERECTOMY Right 02/05/2017   Procedure: ENDARTERECTOMY CAROTID-RIGHT;  Surgeon: [name], MD;  Location: [facility] OR;  Service: Vascular;  Laterality: Right;  . ESOPHAGOGASTRODUODENOSCOPY  11/08/2007    Normal esophagus without evidence of Barrett, mass, erosion/ Normal stomach, duodenal bulb  . GASTRIC MOTILITY STUDY  11/13/2007   mildly delayed emptying subjectively, but normal  analysis-77% of tracer emptied at 2 hours  . JOINT REPLACEMENT    . LAPAROSCOPIC APPENDECTOMY N/A 09/15/2012   Procedure: APPENDECTOMY LAPAROSCOPIC;  Surgeon: [name], MD;  Location: [facility] ORS;  Service: General;  Laterality: N/A;   . LEFT HEART CATHETERIZATION WITH CORONARY ANGIOGRAM N/A 01/05/2014   Procedure: LEFT HEART CATHETERIZATION WITH CORONARY ANGIOGRAM;  Surgeon: [name], MD;  Location: [facility] CATH LAB;  Service: Cardiovascular;  Laterality: N/A;  . REPLACEMENT TOTAL KNEE Right 05-03-06  . TEE WITHOUT CARDIOVERSION N/A 03/17/2014   Procedure: TRANSESOPHAGEAL ECHOCARDIOGRAM (TEE);  Surgeon: [name], MD;  Location: [facility] ENDOSCOPY;  Service: Cardiovascular;  Laterality: N/A;  . TONSILLECTOMY  1972   Social History   Socioeconomic History  . Marital status: Divorced    Spouse name: None  . Number of children: None  . Years of education: None  . Highest education level: None  Social Needs  . Financial resource strain: None  . Food insecurity - worry: None  . Food insecurity - inability: None  . Transportation needs - medical: None  . Transportation needs - non-medical: None  Occupational History  . None  Tobacco Use  . Smoking status: Never Smoker  . Smokeless tobacco: Never Used  Substance and Sexual Activity  . Alcohol use: No  . Drug use: No  . Sexual activity: Not Currently    Birth control/protection: Post-menopausal  Other Topics Concern  . None  Social History Narrative  . None   Outpatient  Encounter Medications as of 06/11/2017  Medication Sig  . ACCU-CHEK AVIVA PLUS test strip   . ACCU-CHEK SOFTCLIX LANCETS lancets   . amLODipine (NORVASC) 10 MG tablet Take 5 mg by mouth daily.   [name] apixaban (ELIQUIS) 5 MG TABS tablet Take 1 tablet (5 mg total) by mouth 2 (two) times daily.  [name] aspirin 81 MG chewable tablet Chew 81 mg by mouth daily.  [name] atorvastatin (LIPITOR) 80 MG tablet Take 1 tablet (80 mg total) by mouth every evening. (Patient taking differently: Take 80 mg by mouth daily at 6 PM. )  . clotrimazole-betamethasone (LOTRISONE) cream Apply 1 application topically 3 (three) times daily. foot  . colesevelam (WELCHOL) 625 MG tablet Take 1,875 mg by mouth 2 (two) times daily with  a meal. *May take 6 tablets once a day with meals*  . diazepam (VALIUM) 2 MG tablet Take 1 tablet (2 mg total) by mouth 2 (two) times daily.  . furosemide (LASIX) 40 MG tablet Take 1 tablet (40 mg total) by mouth every morning. (Patient taking differently: Take 40 mg by mouth 2 (two) times daily. )  . HYDROcodone-acetaminophen (NORCO/VICODIN) 5-325 MG tablet Take 1 tablet by mouth every 4 (four) hours as needed (migraines).   [name] HYDROcodone-acetaminophen (NORCO/VICODIN) 5-325 MG tablet Take 1 tablet by mouth every 4 (four) hours as needed (migraines).  . insulin glargine (LANTUS) 100 UNIT/ML injection Inject 30 Units into the skin at bedtime.  [name] lisinopril (PRINIVIL,ZESTRIL) 10 MG tablet Take 10 mg by mouth daily.   . meclizine (ANTIVERT) 12.5 MG tablet Take 1 tablet (12.5 mg total) by mouth 3 (three) times daily as needed for dizziness.  . nitroGLYCERIN (NITROSTAT) 0.4 MG SL tablet Place 1 tablet (0.4 mg total) under the tongue every 5 (five) minutes as needed for chest pain (up to 3 doses).  [name] tiZANidine (ZANAFLEX) 4 MG tablet Take 4 mg by mouth at bedtime.   . [DISCONTINUED] glimepiride (AMARYL) 2 MG tablet Take 2 mg by mouth daily with breakfast.  . [DISCONTINUED] HUMALOG 100 UNIT/ML injection Inject 1-10 Units into the skin 3 (three) times daily with meals. Per sliding scale   No facility-administered encounter medications on file as of 06/11/2017.     ALLERGIES: Allergies  Allergen Reactions  . Penicillins Hives    Has patient had a PCN reaction causing immediate rash, facial/tongue/throat swelling, SOB or lightheadedness with hypotension: no Has patient had a PCN reaction causing severe rash involving mucus membranes or skin necrosis: No  Has patient had a PCN reaction that required hospitalization: no Has patient had a PCN reaction occurring within the last 10 years: no If all of the above answers are "NO", then may proceed with Cephalosporin use.     VACCINATION STATUS:  There  is no immunization history on file for this patient.  Diabetes  She presents for her initial diabetic visit. She has type 2 diabetes mellitus. Onset time: She was diagnosed at approximate age of 75 years. Her disease course has been worsening. Pertinent negatives for hypoglycemia include no confusion, headaches, pallor or seizures. Associated symptoms include fatigue, foot paresthesias, foot ulcerations and polyuria. Pertinent negatives for diabetes include no chest pain, no polydipsia and no polyphagia. Diabetic complications include heart disease, nephropathy, PVD and retinopathy. Risk factors for coronary artery disease include family history, dyslipidemia, diabetes mellitus, hypertension, sedentary lifestyle and post-menopausal. Current diabetic treatment includes insulin injections and oral agent (monotherapy). Her weight is decreasing steadily. She is following a generally unhealthy diet. When asked  about meal planning, she reported none. She has not had a previous visit with a dietitian. She participates in exercise intermittently. (She did not bring any meter nor logs to review today. Her A1c was 8.9% from August 2018. Her prior A1c was 9.2%.) An ACE inhibitor/angiotensin II receptor blocker is being taken. Eye exam is current (She has retinopathy status post multiple laser therapies on bilateral eyes.).  Hyperlipidemia  This is a chronic problem. The current episode started more than 1 year ago. The problem is controlled. Recent lipid tests were reviewed and are normal. Exacerbating diseases include diabetes. Pertinent negatives include no chest pain, myalgias or shortness of breath. Current antihyperlipidemic treatment includes statins. Risk factors for coronary artery disease include diabetes mellitus, dyslipidemia, family history, hypertension, a sedentary lifestyle and post-menopausal.  Hypertension  This is a chronic problem. The current episode started more than 1 year ago. The problem is  uncontrolled. Pertinent negatives include no chest pain, headaches, palpitations or shortness of breath. Risk factors for coronary artery disease include dyslipidemia, diabetes mellitus, family history, sedentary lifestyle and post-menopausal state. Past treatments include ACE inhibitors. Hypertensive end-organ damage includes PVD and retinopathy.    Review of Systems  Constitutional: Positive for fatigue. Negative for chills, fever and unexpected weight change.  HENT: Negative for trouble swallowing and voice change.   Eyes: Negative for visual disturbance.  Respiratory: Negative for cough, shortness of breath and wheezing.   Cardiovascular: Negative for chest pain, palpitations and leg swelling.  Gastrointestinal: Negative for diarrhea, nausea and vomiting.  Endocrine: Positive for polyuria. Negative for cold intolerance, heat intolerance, polydipsia and polyphagia.  Musculoskeletal: Negative for arthralgias and myalgias.  Skin: Negative for color change, pallor, rash and wound.  Neurological: Negative for seizures and headaches.  Psychiatric/Behavioral: Negative for confusion and suicidal ideas.    Objective:    BP (!) 158/64   Pulse 71   Ht 5\' 6"  (1.676 m)   Wt 181 lb (82.1 kg)   BMI 29.21 kg/m   Wt Readings from Last 3 Encounters:  06/11/17 181 lb (82.1 kg)  03/01/17 195 lb (88.5 kg)  02/05/17 199 lb (90.3 kg)     Physical Exam  Constitutional: She is oriented to person, place, and time. She appears well-developed.  HENT:  Head: Normocephalic and atraumatic.  Eyes: EOM are normal.  Neck: Normal range of motion. Neck supple. No tracheal deviation present. No thyromegaly present.  Cardiovascular: Normal rate and regular rhythm.  Pulmonary/Chest: Effort normal and breath sounds normal.  Abdominal: Soft. Bowel sounds are normal. There is no tenderness. There is no guarding.  Musculoskeletal: Normal range of motion. She exhibits no edema.  Neurological: She is alert and  oriented to person, place, and time. She has normal reflexes. No cranial nerve deficit. Coordination normal.  Skin: Skin is warm and dry. No rash noted. No erythema. No pallor.  Psychiatric: She has a normal mood and affect. Judgment normal.      CMP ( most recent) CMP     Component Value Date/Time   NA 138 02/06/2017 0446   K 4.6 02/06/2017 0446   CL 108 02/06/2017 0446   CO2 25 02/06/2017 0446   GLUCOSE 252 (H) 02/06/2017 0446   BUN 17 02/06/2017 0446   CREATININE 1.39 (H) 02/06/2017 0446   CREATININE 1.40 (H) 07/01/2015 0926   CALCIUM 9.2 02/06/2017 0446   PROT 7.4 02/01/2017 1325   ALBUMIN 3.8 02/01/2017 1325   AST 123 (H) 02/01/2017 1325   ALT 113 (H) 02/01/2017  1325   ALKPHOS 111 02/01/2017 1325   BILITOT 0.6 02/01/2017 1325   GFRNONAA 37 (L) 02/06/2017 0446   GFRAA 43 (L) 02/06/2017 0446     Diabetic Labs (most recent): Lab Results  Component Value Date   HGBA1C 8.9 (H) 02/05/2017   HGBA1C 9.2 (H) 01/20/2017   HGBA1C 8.1 (H) 01/04/2014     Lipid Panel ( most recent) Lipid Panel     Component Value Date/Time   CHOL 102 01/20/2017 0609   TRIG 54 01/20/2017 0609   HDL 47 01/20/2017 0609   CHOLHDL 2.2 01/20/2017 0609   VLDL 11 01/20/2017 0609   LDLCALC 44 01/20/2017 0609       Results for LEJLA, MOESER (MRN [id number]) as of 06/11/2017 09:28  Ref. Range 02/25/2010 18:20 03/12/2014 12:31  TSH Latest Ref Range: 0.350 - 4.500 uIU/mL 1.497 0.874  T4,Free(Direct) Latest Ref Range: 0.80 - 1.80 ng/dL 1.10      Assessment & Plan:   1. DM type 2 causing vascular disease ([place])  - Cassandra Adams has currently uncontrolled symptomatic type 2 DM since  71 years of age,  with most recent A1c of 8.9 %.  - She does not have recent labs to review, I'll be sent for new set of labs today.  -her diabetes is complicated by coronary artery disease, Cassandra Adams date atherosclerosis, renal insufficiency, retinopathy, peripheral neuropathy and Cassandra Adams  remains at a high risk for more acute and chronic complications which include CAD, CVA, CKD, retinopathy, and neuropathy. These are all discussed in detail with the patient.  - I have counseled her on diet management and weight loss, by adopting a carbohydrate restricted/protein rich diet.  - Suggestion is made for her to avoid simple carbohydrates  from her diet including Cakes, Sweet Desserts, Ice Cream, Soda (diet and regular), Sweet Tea, Candies, Chips, Cookies, Store Bought Juices, Alcohol in Excess of  1-2 drinks a day, Artificial Sweeteners, and "Sugar-free" Products. This will help patient to have stable blood glucose profile and potentially avoid unintended weight gain.  - I encouraged her to switch to  unprocessed or minimally processed complex starch and increased protein intake (animal or plant source), fruits, and vegetables.  - she is advised to stick to a routine mealtimes to eat 3 meals  a day and avoid unnecessary snacks ( to snack only to correct hypoglycemia).   - she will be scheduled with Jearld Fenton, RDN, CDE for individualized diabetes education.  - I have approached her with the following individualized plan to manage diabetes and patient agrees:   - She may require intensive treatment with basal/bolus insulin to achieve control of diabetes to target after her commitment for proper monitoring of blood glucose is assured. - In the meantime, I approached her to start strict monitoring of blood glucose 4 times a day-before meals and at bedtime and return with her meter and logs in 1 week for reevaluation. - I will increase her Lantus to 30 units daily at bedtime, hold Humalog for now.  - Patient is warned not to take insulin without proper monitoring per orders.  -Patient is encouraged to call clinic for blood glucose levels less than 70 or above 300 mg /dl.  - I will discontinue glimepiride, risk outweighs benefit for this patient. -Patient is not a candidate for  metformin,SGLT2 inhibitors due to CKD. - she will be considered for incretin therapy as appropriate next visit. - Patient specific target  A1c;  LDL, HDL, Triglycerides, and  Waist Circumference were discussed in detail.  2) BP/HTN: Uncontrolled. She may require higher dose of lisinopril if blood pressure remains above target by next visit. I advised her to Continue current medications including ACEI/ARB. 3) Lipids/HPL:   Controlled, LDL 44.   Patient is advised to continue statins. 4)  Weight/Diet: CDE Consult will be initiated , exercise, and detailed carbohydrates information provided.  5) Chronic Care/Health Maintenance:  -she  is on ACEI/ARB and Statin medications and  is encouraged to continue to follow up with Ophthalmology, Dentist,  Podiatrist at least yearly or according to recommendations, and advised to  stay away from smoking. I have recommended yearly flu vaccine and pneumonia vaccination at least every 5 years; moderate intensity exercise for up to 150 minutes weekly; and  sleep for at least 7 hours a day.  - I advised patient to maintain close follow up with Redmond School, MD for primary care needs.  - Time spent with the patient: 1 hour, of which >50% was spent in obtaining information about her symptoms, reviewing her previous labs, evaluations, and treatments, counseling her about her  currently complicated type 2 diabetes, hypertension, hyperlipidemia, and developing a plan for long term treatment; her  questions were answered to her satisfaction.  Follow up plan: - Return in about 1 week (around 06/18/2017) for meter, and logs, labs today.  Glade Lloyd, MD [facility] [address] [place], [place] [address] Phone: [phone]  Fax: [phone]    06/11/2017, 9:23 AM  This note was partially dictated with voice recognition software. Similar sounding words can be transcribed inadequately or may not  be corrected  upon review.

## 2017-06-13 ENCOUNTER — Telehealth: Payer: Self-pay

## 2017-06-13 NOTE — Telephone Encounter (Signed)
Left message for pt to call back  °

## 2017-06-13 NOTE — Telephone Encounter (Signed)
Pt states she has had high BG readings.   Date Before breakfast Before lunch Before supper Bedtime  12/17    137  12/18 80 128 292   12/19 43 378            Pt taking: Lantus 30 units.   I tried to explain to the pt that the high reading at lunch of 378 was more than likely due to what she ate when her reading was 43 in the a.m.

## 2017-06-13 NOTE — Telephone Encounter (Signed)
Pt.notified

## 2017-06-13 NOTE — Telephone Encounter (Signed)
Lower Lantus to 20 units qhs. Continue to monitor blood glucose 4 times a day, return on scheduled appointment with meter and logs.

## 2017-06-18 ENCOUNTER — Emergency Department (HOSPITAL_COMMUNITY)
Admission: EM | Admit: 2017-06-18 | Discharge: 2017-06-18 | Disposition: A | Discharge status: Home / Self Care | Payer: Medicare Other | Attending: Emergency Medicine | Admitting: Emergency Medicine

## 2017-06-18 ENCOUNTER — Encounter (HOSPITAL_COMMUNITY): Payer: Self-pay | Admitting: *Deleted

## 2017-06-18 DIAGNOSIS — Z79899 Other long term (current) drug therapy: Secondary | ICD-10-CM | POA: Diagnosis not present

## 2017-06-18 DIAGNOSIS — I129 Hypertensive chronic kidney disease with stage 1 through stage 4 chronic kidney disease, or unspecified chronic kidney disease: Secondary | ICD-10-CM | POA: Insufficient documentation

## 2017-06-18 DIAGNOSIS — Z96651 Presence of right artificial knee joint: Secondary | ICD-10-CM | POA: Insufficient documentation

## 2017-06-18 DIAGNOSIS — N184 Chronic kidney disease, stage 4 (severe): Secondary | ICD-10-CM | POA: Insufficient documentation

## 2017-06-18 DIAGNOSIS — E1165 Type 2 diabetes mellitus with hyperglycemia: Secondary | ICD-10-CM | POA: Insufficient documentation

## 2017-06-18 DIAGNOSIS — Z794 Long term (current) use of insulin: Secondary | ICD-10-CM | POA: Diagnosis not present

## 2017-06-18 DIAGNOSIS — I2510 Atherosclerotic heart disease of native coronary artery without angina pectoris: Secondary | ICD-10-CM | POA: Insufficient documentation

## 2017-06-18 DIAGNOSIS — R42 Dizziness and giddiness: Secondary | ICD-10-CM | POA: Diagnosis not present

## 2017-06-18 DIAGNOSIS — E039 Hypothyroidism, unspecified: Secondary | ICD-10-CM | POA: Insufficient documentation

## 2017-06-18 DIAGNOSIS — R739 Hyperglycemia, unspecified: Secondary | ICD-10-CM

## 2017-06-18 LAB — CBG MONITORING, ED: GLUCOSE-CAPILLARY: 284 mg/dL — AB (ref 65–99)

## 2017-06-18 LAB — CBC WITH DIFFERENTIAL/PLATELET
BASOS PCT: 0 %
Basophils Absolute: 0 10*3/uL (ref 0.0–0.1)
EOS ABS: 0.1 10*3/uL (ref 0.0–0.7)
EOS PCT: 3 %
HCT: 33.5 % — ABNORMAL LOW (ref 36.0–46.0)
HEMOGLOBIN: 11.4 g/dL — AB (ref 12.0–15.0)
LYMPHS ABS: 1.1 10*3/uL (ref 0.7–4.0)
Lymphocytes Relative: 26 %
MCH: 26.5 pg (ref 26.0–34.0)
MCHC: 34 g/dL (ref 30.0–36.0)
MCV: 77.9 fL — ABNORMAL LOW (ref 78.0–100.0)
MONOS PCT: 9 %
Monocytes Absolute: 0.4 10*3/uL (ref 0.1–1.0)
NEUTROS PCT: 62 %
Neutro Abs: 2.6 10*3/uL (ref 1.7–7.7)
PLATELETS: 147 10*3/uL — AB (ref 150–400)
RBC: 4.3 MIL/uL (ref 3.87–5.11)
RDW: 14.3 % (ref 11.5–15.5)
WBC: 4.2 10*3/uL (ref 4.0–10.5)

## 2017-06-18 LAB — BASIC METABOLIC PANEL
Anion gap: 10 (ref 5–15)
BUN: 29 mg/dL — AB (ref 6–20)
CALCIUM: 10.6 mg/dL — AB (ref 8.9–10.3)
CHLORIDE: 103 mmol/L (ref 101–111)
CO2: 23 mmol/L (ref 22–32)
CREATININE: 1.31 mg/dL — AB (ref 0.44–1.00)
GFR, EST AFRICAN AMERICAN: 46 mL/min — AB (ref >60)
GFR, EST NON AFRICAN AMERICAN: 40 mL/min — AB (ref >60)
Glucose, Bld: 634 mg/dL (ref 65–99)
Potassium: 4.8 mmol/L (ref 3.5–5.1)
SODIUM: 136 mmol/L (ref 135–145)

## 2017-06-18 LAB — BLOOD GAS, VENOUS
ACID-BASE DEFICIT: 0.5 mmol/L (ref 0.0–2.0)
BICARBONATE: 22.5 mmol/L (ref 20.0–28.0)
FIO2: 0.21
O2 SAT: 49.8 %
PATIENT TEMPERATURE: 37
pCO2, Ven: 50.4 mmHg (ref 44.0–60.0)
pH, Ven: 7.315 (ref 7.250–7.430)

## 2017-06-18 MED ORDER — INSULIN ASPART 100 UNIT/ML IV SOLN
10.0000 [IU] | Freq: Once | INTRAVENOUS | Status: AC
  Administered 2017-06-18: 10 [IU] via INTRAVENOUS

## 2017-06-18 MED ORDER — INSULIN ASPART 100 UNIT/ML ~~LOC~~ SOLN
SUBCUTANEOUS | Status: AC
  Filled 2017-06-18: qty 1

## 2017-06-18 MED ORDER — SODIUM CHLORIDE 0.9 % IV BOLUS (SEPSIS)
1000.0000 mL | Freq: Once | INTRAVENOUS | Status: AC
  Administered 2017-06-18: 1000 mL via INTRAVENOUS

## 2017-06-18 MED ORDER — SODIUM CHLORIDE 0.9 % IV BOLUS (SEPSIS): 500.0000 mL | Freq: Once | INTRAVENOUS | Status: DC

## 2017-06-18 NOTE — ED Notes (Signed)
Respiratory was called to run VBG

## 2017-06-18 NOTE — ED Notes (Signed)
Pt wheeled to waiting room. Pt verbalized understanding of discharge instructions.   

## 2017-06-18 NOTE — ED Notes (Signed)
Critical glucose 634 and Venous blood gas Po2 - below reportable range. MD Pickering notified.

## 2017-06-18 NOTE — Discharge Instructions (Signed)
Continue to check her glucose.  You may need some of your short acting insulin but I would do at a lower dose than you were previously.  If your sugar goes too high or too low tomorrow come back to the ER.  Otherwise follow-up with your endocrinologist on Wednesday as planned.

## 2017-06-18 NOTE — ED Triage Notes (Signed)
Pt with hyperglycemia since this morning, medications recently adjusted due to being too low.  Insulin recently stopped as well.

## 2017-06-18 NOTE — ED Provider Notes (Signed)
[facility] EMERGENCY DEPARTMENT Provider Note   CSN: [id number] Arrival date & time: 06/18/17  1629     History   Chief Complaint Chief Complaint  Patient presents with  . Hyperglycemia    HPI Cassandra Adams is a 71 y.o. female.  HPI Patient presents with hyperglycemia and lightheadedness.  She is a diabetic and saw Dr. Dorris Fetch on the 17th.  Her Humalog was stopped and her Lantus was increased.  Since then she has had increases in her sugars.  States they have been running in the 4 and 500s.  Has had urinary frequency and is been getting up at night.  Has had fatigue and some lightheadedness.  No chest pain or trouble breathing.  States she does feel little fatigued.  No dysuria.  No chest pain or trouble breathing. Past Medical History:  Diagnosis Date  . Arthritis    "all over"  . CAD (coronary artery disease)    a. NSTEMI 12/2013 - occluded dominant RCA with L-R collaterals, moderate prox segmental LAD disease, for medical therapy initially, EF 60% with subtle inferobasal hypokinesia. Consider PCI for refractory CP.  [name] CKD (chronic kidney disease), stage III ([place])   . Hypertension   . Migraines    "weekly" (01/06/2014)  . Sickle cell trait ([place])   . TIA (transient ischemic attack) 1978  . Type II diabetes mellitus ([place])   . Vertigo     Patient Active Problem List   Diagnosis Date Noted  . DM type 2 causing vascular disease ([place]) 06/11/2017  . Type 2 diabetes mellitus with stage 4 chronic kidney disease, with long-term current use of insulin ([place]) 06/11/2017  . Carotid stenosis 02/05/2017  . TIA (transient ischemic attack) 01/20/2017  . Carotid stenosis, right 01/20/2017  . Diabetic hyperosmolar non-ketotic state ([place]) 01/19/2017  . AKI (acute kidney injury) ([place]) 01/19/2017  . Left leg weakness 01/19/2017  . Dyslipidemia 11/24/2016  . Edema 03/30/2016  . Diaphoresis 02/18/2016  . Hypoglycemia 02/18/2016  . Bradycardia 04/22/2015  . CAD (coronary artery  disease) 02/26/2014  . PAF (paroxysmal atrial fibrillation) ([place]) 02/26/2014  . NSTEMI (non-ST elevated myocardial infarction) ([place]) 01/03/2014  . Occlusion and stenosis of carotid artery without mention of cerebral infarction 11/21/2013  . Pain in limb-Left neck 11/21/2013  . Carotid stenosis, bilateral 11/17/2011  . HYPERTHYROIDISM, SUBCLINICAL 02/15/2010  . GERD 02/15/2010  . DYSPHAGIA UNSPECIFIED 02/15/2010  . ABDOMINAL PAIN, UNSPECIFIED SITE 02/15/2010  . Controlled insulin-dependent diabetes mellitus with neuropathy ([place]) 02/11/2010  . Essential hypertension, benign 02/11/2010  . HEADACHE 02/11/2010    Past Surgical History:  Procedure Laterality Date  . APPENDECTOMY  March 2014   had appendix frozen  . CARDIAC CATHETERIZATION  "years ago" & 01/05/2014  . CARDIOVERSION N/A 03/17/2014   Procedure: CARDIOVERSION;  Surgeon: [name], MD;  Location: [facility] ENDOSCOPY;  Service: Cardiovascular;  Laterality: N/A;  . CATARACT EXTRACTION W/ INTRAOCULAR LENS  IMPLANT, BILATERAL Bilateral 04/2013-05/2013  . ENDARTERECTOMY Right 02/05/2017   Procedure: ENDARTERECTOMY CAROTID-RIGHT;  Surgeon: [name], MD;  Location: [facility] OR;  Service: Vascular;  Laterality: Right;  . ESOPHAGOGASTRODUODENOSCOPY  11/08/2007    Normal esophagus without evidence of Barrett, mass, erosion/ Normal stomach, duodenal bulb  . GASTRIC MOTILITY STUDY  11/13/2007   mildly delayed emptying subjectively, but normal  analysis-77% of tracer emptied at 2 hours  . JOINT REPLACEMENT    . LAPAROSCOPIC APPENDECTOMY N/A 09/15/2012   Procedure: APPENDECTOMY LAPAROSCOPIC;  Surgeon: [name], MD;  Location: [facility] ORS;  Service: General;  Laterality: N/A;  . LEFT HEART CATHETERIZATION WITH CORONARY ANGIOGRAM N/A 01/05/2014   Procedure: LEFT HEART CATHETERIZATION WITH CORONARY ANGIOGRAM;  Surgeon: [name], MD;  Location: [facility] CATH LAB;  Service: Cardiovascular;  Laterality: N/A;  . REPLACEMENT TOTAL KNEE Right  05-03-06  . TEE WITHOUT CARDIOVERSION N/A 03/17/2014   Procedure: TRANSESOPHAGEAL ECHOCARDIOGRAM (TEE);  Surgeon: [name], MD;  Location: [facility] ENDOSCOPY;  Service: Cardiovascular;  Laterality: N/A;  . TONSILLECTOMY  1972    OB History    Gravida Para Term Preterm AB Living   2 2 2     2    SAB TAB Ectopic Multiple Live Births                   Home Medications    Prior to Admission medications   Medication Sig Start Date End Date Taking? Authorizing Provider  amLODipine (NORVASC) 10 MG tablet Take 5 mg by mouth at bedtime.  02/25/16  Yes [provider]  aspirin 81 MG chewable tablet Chew 81 mg by mouth daily.   Yes [provider]  atorvastatin (LIPITOR) 80 MG tablet Take 1 tablet (80 mg total) by mouth every evening. Patient taking differently: Take 80 mg by mouth daily at 6 PM.  11/24/16  Yes Hilty, Nadean Corwin, MD  clotrimazole-betamethasone (LOTRISONE) cream Apply 1 application topically 3 (three) times daily. foot 01/13/14  Yes [provider]  colesevelam (WELCHOL) 625 MG tablet Take 1,875 mg by mouth 2 (two) times daily with a meal. *May take 6 tablets once a day with meals*   Yes [provider]  furosemide (LASIX) 40 MG tablet Take 1 tablet (40 mg total) by mouth every morning. Patient taking differently: Take 80 mg by mouth every morning.  01/07/14  Yes Dunn, Dayna N, PA-C  HYDROcodone-acetaminophen (NORCO/VICODIN) 5-325 MG tablet Take 1 tablet by mouth every 4 (four) hours as needed (migraines). 02/06/17  Yes Laurence Slate M, PA-C  insulin glargine (LANTUS) 100 UNIT/ML injection Inject 30 Units into the skin at bedtime.   Yes [provider]  lisinopril (PRINIVIL,ZESTRIL) 10 MG tablet Take 10 mg by mouth daily.  01/16/17  Yes [provider]  tiZANidine (ZANAFLEX) 4 MG tablet Take 4 mg by mouth at bedtime.  12/09/15  Yes [provider]  ACCU-CHEK AVIVA PLUS test strip  03/03/16   [provider]  ACCU-CHEK  SOFTCLIX LANCETS lancets  03/03/16   [provider]  nitroGLYCERIN (NITROSTAT) 0.4 MG SL tablet Place 1 tablet (0.4 mg total) under the tongue every 5 (five) minutes as needed for chest pain (up to 3 doses). 11/24/16   Hilty, Nadean Corwin, MD    Family History Family History  Problem Relation Age of Onset  . Hyperlipidemia Mother   . Hypertension Mother   . Heart attack Mother   . Cancer Father        lung  . Hypertension Sister   . Diabetes Brother   . Heart disease Brother   . Hyperlipidemia Brother   . Hypertension Brother   . Heart attack Brother   . Other Brother        DVT    Social History Social History   Tobacco Use  . Smoking status: Never Smoker  . Smokeless tobacco: Never Used  Substance Use Topics  . Alcohol use: No  . Drug use: No     Allergies   Penicillins   Review of Systems Review of Systems  Constitutional:  Positive for appetite change and fatigue.  HENT: Negative for congestion.   Respiratory: Negative for shortness of breath.   Cardiovascular: Negative for chest pain.  Gastrointestinal: Negative for abdominal pain.  Endocrine: Positive for polydipsia, polyphagia and polyuria.  Musculoskeletal: Negative for back pain.  Neurological: Positive for light-headedness.  Hematological: Negative for adenopathy.  Psychiatric/Behavioral: Negative for confusion.     Physical Exam Updated Vital Signs BP (!) 159/62   Pulse 79   Temp 97.8 F (36.6 C) (Oral)   Resp (!) 23   Ht 5\' 6"  (1.676 m)   Wt 79.8 kg (176 lb)   SpO2 93%   BMI 28.41 kg/m   Physical Exam  Constitutional: She appears well-developed.  HENT:  Head: Atraumatic.  Eyes: EOM are normal.  Neck: Neck supple.  Cardiovascular: Normal rate.  Pulmonary/Chest: Effort normal.  Abdominal: Soft. There is no tenderness.  Musculoskeletal: She exhibits no tenderness.  Neurological: She is alert.  Skin: Skin is warm. Capillary refill takes less than 2 seconds.  Psychiatric: She has  a normal mood and affect.     ED Treatments / Results  Labs (all labs ordered are listed, but only abnormal results are displayed) Labs Reviewed  CBC WITH DIFFERENTIAL/PLATELET - Abnormal; Notable for the following components:      Result Value   Hemoglobin 11.4 (*)    HCT 33.5 (*)    MCV 77.9 (*)    Platelets 147 (*)    All other components within normal limits  BASIC METABOLIC PANEL - Abnormal; Notable for the following components:   Glucose, Bld 634 (*)    BUN 29 (*)    Creatinine, Ser 1.31 (*)    Calcium 10.6 (*)    GFR calc non Af Amer 40 (*)    GFR calc Af Amer 46 (*)    All other components within normal limits  CBG MONITORING, ED - Abnormal; Notable for the following components:   Glucose-Capillary >600 (*)    All other components within normal limits  CBG MONITORING, ED - Abnormal; Notable for the following components:   Glucose-Capillary 284 (*)    All other components within normal limits  BLOOD GAS, VENOUS    EKG  EKG Interpretation  Date/Time:  Monday June 18 2017 17:39:45 EST Ventricular Rate:  77 PR Interval:    QRS Duration: 102 QT Interval:  337 QTC Calculation: 382 R Axis:   25 Text Interpretation:  Sinus rhythm Atrial premature complex Borderline repolarization abnormality Borderline ST elevation, anterior leads Confirmed by Davonna Belling [phone]) on 06/18/2017 6:51:12 PM       Radiology No results found.  Procedures Procedures (including critical care time)  Medications Ordered in ED Medications  insulin aspart (novoLOG) 100 UNIT/ML injection (not administered)  sodium chloride 0.9 % bolus 500 mL (not administered)  sodium chloride 0.9 % bolus 1,000 mL (0 mLs Intravenous Stopped 06/18/17 1920)  insulin aspart (novoLOG) injection 10 Units (10 Units Intravenous Given 06/18/17 1921)     Initial Impression / Assessment and Plan / ED Course  I have reviewed the triage vital signs and the nursing notes.  Pertinent labs & imaging  results that were available during my care of the patient were reviewed by me and considered in my medical decision making (see chart for details).     Patient with hyperglycemia.  Recent decrease in her insulin.  Not in DKA.  Sugars come down.  Should be able to manage at home.  Will start back on a  lower dose of her sliding scale.  Discussed with this patient and her family members.  Would like blood sugars lower but still error towards elevated as opposed to low which she has been in the past.  Follow-up with her endocrinologist on Wednesday as planned  Final Clinical Impressions(s) / ED Diagnoses   Final diagnoses:  Hyperglycemia    ED Discharge Orders    None       Davonna Belling, MD 06/18/17 2059

## 2017-06-20 ENCOUNTER — Ambulatory Visit (INDEPENDENT_AMBULATORY_CARE_PROVIDER_SITE_OTHER): Payer: Medicare Other | Admitting: "Endocrinology

## 2017-06-20 ENCOUNTER — Encounter: Payer: Self-pay | Admitting: "Endocrinology

## 2017-06-20 VITALS — BP 161/64 | HR 99 | Ht 66.0 in | Wt 174.0 lb

## 2017-06-20 DIAGNOSIS — N184 Chronic kidney disease, stage 4 (severe): Secondary | ICD-10-CM | POA: Diagnosis not present

## 2017-06-20 DIAGNOSIS — I10 Essential (primary) hypertension: Secondary | ICD-10-CM

## 2017-06-20 DIAGNOSIS — Z794 Long term (current) use of insulin: Secondary | ICD-10-CM

## 2017-06-20 DIAGNOSIS — E1159 Type 2 diabetes mellitus with other circulatory complications: Secondary | ICD-10-CM

## 2017-06-20 DIAGNOSIS — E1122 Type 2 diabetes mellitus with diabetic chronic kidney disease: Secondary | ICD-10-CM

## 2017-06-20 LAB — CBG MONITORING, ED: Glucose-Capillary: >600 mg/dL (ref 65–99)

## 2017-06-20 MED ORDER — FREESTYLE LIBRE READER DEVI: 1.0000 | Freq: Once | 0 refills | Status: AC

## 2017-06-20 MED ORDER — LISINOPRIL 20 MG PO TABS: 20.0000 mg | ORAL_TABLET | Freq: Every day | ORAL | 3 refills | Status: DC

## 2017-06-20 MED ORDER — FREESTYLE LIBRE SENSOR SYSTEM MISC: 2 refills | Status: DC

## 2017-06-20 NOTE — Patient Instructions (Signed)

## 2017-06-20 NOTE — Progress Notes (Signed)
Consult Note       06/20/2017, 11:09 AM   Subjective:    Patient ID: Cassandra Adams, female    DOB: [birth year].  Cassandra Adams is being seen in consultation for management of currently uncontrolled symptomatic diabetes requested by  Redmond School, MD.   Past Medical History:  Diagnosis Date  . Arthritis    "all over"  . CAD (coronary artery disease)    a. NSTEMI 12/2013 - occluded dominant RCA with L-R collaterals, moderate prox segmental LAD disease, for medical therapy initially, EF 60% with subtle inferobasal hypokinesia. Consider PCI for refractory CP.  [name] CKD (chronic kidney disease), stage III ([place])   . Hypertension   . Migraines    "weekly" (01/06/2014)  . Sickle cell trait ([place])   . TIA (transient ischemic attack) 1978  . Type II diabetes mellitus ([place])   . Vertigo    Past Surgical History:  Procedure Laterality Date  . APPENDECTOMY  March 2014   had appendix frozen  . CARDIAC CATHETERIZATION  "years ago" & 01/05/2014  . CARDIOVERSION N/A 03/17/2014   Procedure: CARDIOVERSION;  Surgeon: [name], MD;  Location: [facility] ENDOSCOPY;  Service: Cardiovascular;  Laterality: N/A;  . CATARACT EXTRACTION W/ INTRAOCULAR LENS  IMPLANT, BILATERAL Bilateral 04/2013-05/2013  . ENDARTERECTOMY Right 02/05/2017   Procedure: ENDARTERECTOMY CAROTID-RIGHT;  Surgeon: [name], MD;  Location: [facility] OR;  Service: Vascular;  Laterality: Right;  . ESOPHAGOGASTRODUODENOSCOPY  11/08/2007    Normal esophagus without evidence of Barrett, mass, erosion/ Normal stomach, duodenal bulb  . GASTRIC MOTILITY STUDY  11/13/2007   mildly delayed emptying subjectively, but normal  analysis-77% of tracer emptied at 2 hours  . JOINT REPLACEMENT    . LAPAROSCOPIC APPENDECTOMY N/A 09/15/2012   Procedure: APPENDECTOMY LAPAROSCOPIC;  Surgeon: [name], MD;  Location: [facility] ORS;  Service: General;  Laterality:  N/A;  . LEFT HEART CATHETERIZATION WITH CORONARY ANGIOGRAM N/A 01/05/2014   Procedure: LEFT HEART CATHETERIZATION WITH CORONARY ANGIOGRAM;  Surgeon: [name], MD;  Location: [facility] CATH LAB;  Service: Cardiovascular;  Laterality: N/A;  . REPLACEMENT TOTAL KNEE Right 05-03-06  . TEE WITHOUT CARDIOVERSION N/A 03/17/2014   Procedure: TRANSESOPHAGEAL ECHOCARDIOGRAM (TEE);  Surgeon: [name], MD;  Location: [facility] ENDOSCOPY;  Service: Cardiovascular;  Laterality: N/A;  . TONSILLECTOMY  1972   Social History   Socioeconomic History  . Marital status: Divorced    Spouse name: None  . Number of children: None  . Years of education: None  . Highest education level: None  Social Needs  . Financial resource strain: None  . Food insecurity - worry: None  . Food insecurity - inability: None  . Transportation needs - medical: None  . Transportation needs - non-medical: None  Occupational History  . None  Tobacco Use  . Smoking status: Never Smoker  . Smokeless tobacco: Never Used  Substance and Sexual Activity  . Alcohol use: No  . Drug use: No  . Sexual activity: Not Currently    Birth control/protection: Post-menopausal  Other Topics Concern  . None  Social History Narrative  . None   Outpatient  Encounter Medications as of 06/20/2017  Medication Sig  . insulin lispro (HUMALOG) 100 UNIT/ML injection Inject 5-11 Units into the skin 3 (three) times daily before meals.  [name] ACCU-CHEK AVIVA PLUS test strip   . ACCU-CHEK SOFTCLIX LANCETS lancets   . amLODipine (NORVASC) 10 MG tablet Take 5 mg by mouth at bedtime.   [name] aspirin 81 MG chewable tablet Chew 81 mg by mouth daily.  [name] atorvastatin (LIPITOR) 80 MG tablet Take 1 tablet (80 mg total) by mouth every evening. (Patient taking differently: Take 80 mg by mouth daily at 6 PM. )  . clotrimazole-betamethasone (LOTRISONE) cream Apply 1 application topically 3 (three) times daily. foot  . colesevelam (WELCHOL) 625 MG tablet Take 1,875 mg by  mouth 2 (two) times daily with a meal. *May take 6 tablets once a day with meals*  . furosemide (LASIX) 40 MG tablet Take 1 tablet (40 mg total) by mouth every morning. (Patient taking differently: Take 80 mg by mouth every morning. )  . HYDROcodone-acetaminophen (NORCO/VICODIN) 5-325 MG tablet Take 1 tablet by mouth every 4 (four) hours as needed (migraines).  . insulin glargine (LANTUS) 100 UNIT/ML injection Inject 20 Units into the skin at bedtime.   [name] lisinopril (PRINIVIL,ZESTRIL) 10 MG tablet Take 10 mg by mouth daily.   . nitroGLYCERIN (NITROSTAT) 0.4 MG SL tablet Place 1 tablet (0.4 mg total) under the tongue every 5 (five) minutes as needed for chest pain (up to 3 doses).  [name] tiZANidine (ZANAFLEX) 4 MG tablet Take 4 mg by mouth at bedtime.    No facility-administered encounter medications on file as of 06/20/2017.     ALLERGIES: Allergies  Allergen Reactions  . Penicillins Hives    Has patient had a PCN reaction causing immediate rash, facial/tongue/throat swelling, SOB or lightheadedness with hypotension: no Has patient had a PCN reaction causing severe rash involving mucus membranes or skin necrosis: No  Has patient had a PCN reaction that required hospitalization: no Has patient had a PCN reaction occurring within the last 10 years: no If all of the above answers are "NO", then may proceed with Cephalosporin use.     VACCINATION STATUS:  There is no immunization history on file for this patient.  Diabetes  She presents for her follow-up diabetic visit. She has type 2 diabetes mellitus. Onset time: She was diagnosed at approximate age of 49 years. Her disease course has been worsening. Pertinent negatives for hypoglycemia include no confusion, headaches, pallor or seizures. Associated symptoms include fatigue, foot paresthesias, foot ulcerations and polyuria. Pertinent negatives for diabetes include no chest pain, no polydipsia and no polyphagia. Diabetic complications include  heart disease, nephropathy, PVD and retinopathy. Risk factors for coronary artery disease include family history, dyslipidemia, diabetes mellitus, hypertension, sedentary lifestyle and post-menopausal. Current diabetic treatment includes insulin injections and oral agent (monotherapy). Her weight is decreasing steadily. She is following a generally unhealthy diet. When asked about meal planning, she reported none. She has not had a previous visit with a dietitian. She participates in exercise intermittently. Her breakfast blood glucose range is generally 130-140 mg/dl. Her lunch blood glucose range is generally >200 mg/dl. Her dinner blood glucose range is generally >200 mg/dl. Her bedtime blood glucose range is generally >200 mg/dl. Her overall blood glucose range is >200 mg/dl. (She did not go to the lab for repeat A1c, however she visited ER over the weekend due to hyperglycemia. Her A1c was 8.9% from August 2018. Her prior A1c was 9.2%.) An ACE inhibitor/angiotensin  II receptor blocker is being taken. Eye exam is current (She has retinopathy status post multiple laser therapies on bilateral eyes.).  Hyperlipidemia  This is a chronic problem. The current episode started more than 1 year ago. The problem is controlled. Recent lipid tests were reviewed and are normal. Exacerbating diseases include diabetes. Pertinent negatives include no chest pain, myalgias or shortness of breath. Current antihyperlipidemic treatment includes statins. Risk factors for coronary artery disease include diabetes mellitus, dyslipidemia, family history, hypertension, a sedentary lifestyle and post-menopausal.  Hypertension  This is a chronic problem. The current episode started more than 1 year ago. The problem is uncontrolled. Pertinent negatives include no chest pain, headaches, palpitations or shortness of breath. Risk factors for coronary artery disease include dyslipidemia, diabetes mellitus, family history, sedentary lifestyle  and post-menopausal state. Past treatments include ACE inhibitors. Hypertensive end-organ damage includes PVD and retinopathy.    Review of Systems  Constitutional: Positive for fatigue. Negative for chills, fever and unexpected weight change.  HENT: Negative for trouble swallowing and voice change.   Eyes: Negative for visual disturbance.  Respiratory: Negative for cough, shortness of breath and wheezing.   Cardiovascular: Negative for chest pain, palpitations and leg swelling.  Gastrointestinal: Negative for diarrhea, nausea and vomiting.  Endocrine: Positive for polyuria. Negative for cold intolerance, heat intolerance, polydipsia and polyphagia.  Musculoskeletal: Negative for arthralgias and myalgias.  Skin: Negative for color change, pallor, rash and wound.  Neurological: Negative for seizures and headaches.  Psychiatric/Behavioral: Negative for confusion and suicidal ideas.    Objective:    BP (!) 161/64   Pulse 99   Ht 5\' 6"  (1.676 m)   Wt 174 lb (78.9 kg)   BMI 28.08 kg/m   Wt Readings from Last 3 Encounters:  06/20/17 174 lb (78.9 kg)  06/18/17 176 lb (79.8 kg)  06/11/17 181 lb (82.1 kg)     Physical Exam  Constitutional: She is oriented to person, place, and time. She appears well-developed.  HENT:  Head: Normocephalic and atraumatic.  Eyes: EOM are normal.  Neck: Normal range of motion. Neck supple. No tracheal deviation present. No thyromegaly present.  Cardiovascular: Normal rate and regular rhythm.  Pulmonary/Chest: Effort normal and breath sounds normal.  Abdominal: Soft. Bowel sounds are normal. There is no tenderness. There is no guarding.  Musculoskeletal: Normal range of motion. She exhibits no edema.  Neurological: She is alert and oriented to person, place, and time. She has normal reflexes. No cranial nerve deficit. Coordination normal.  Skin: Skin is warm and dry. No rash noted. No erythema. No pallor.  Psychiatric: She has a normal mood and affect.  Judgment normal.    CMP ( most recent) CMP     Component Value Date/Time   NA 136 06/18/2017 1747   K 4.8 06/18/2017 1747   CL 103 06/18/2017 1747   CO2 23 06/18/2017 1747   GLUCOSE 634 (HH) 06/18/2017 1747   BUN 29 (H) 06/18/2017 1747   CREATININE 1.31 (H) 06/18/2017 1747   CREATININE 1.40 (H) 07/01/2015 0926   CALCIUM 10.6 (H) 06/18/2017 1747   PROT 7.4 02/01/2017 1325   ALBUMIN 3.8 02/01/2017 1325   AST 123 (H) 02/01/2017 1325   ALT 113 (H) 02/01/2017 1325   ALKPHOS 111 02/01/2017 1325   BILITOT 0.6 02/01/2017 1325   GFRNONAA 40 (L) 06/18/2017 1747   GFRAA 46 (L) 06/18/2017 1747     Diabetic Labs (most recent): Lab Results  Component Value Date   HGBA1C 8.9 (H) 02/05/2017  HGBA1C 9.2 (H) 01/20/2017   HGBA1C 8.1 (H) 01/04/2014     Lipid Panel ( most recent) Lipid Panel     Component Value Date/Time   CHOL 102 01/20/2017 0609   TRIG 54 01/20/2017 0609   HDL 47 01/20/2017 0609   CHOLHDL 2.2 01/20/2017 0609   VLDL 11 01/20/2017 0609   LDLCALC 44 01/20/2017 0609       Results for ANITRIA, ANDON (MRN [id number]) as of 06/11/2017 09:28  Ref. Range 02/25/2010 18:20 03/12/2014 12:31  TSH Latest Ref Range: 0.350 - 4.500 uIU/mL 1.497 0.874  T4,Free(Direct) Latest Ref Range: 0.80 - 1.80 ng/dL 1.10      Assessment & Plan:   1. DM type 2 causing vascular disease ([place])  - [name] has currently uncontrolled symptomatic type 2 DM since  71 years of age,  with most recent A1c of 8.9 %.  - She did not go to the lab during her last visit for A1c, however visited the ER over the weekend for hyperglycemia.  -her diabetes is complicated by coronary artery disease, Arbie Cookey date atherosclerosis, renal insufficiency, retinopathy, peripheral neuropathy and MONIA TIMMERS remains at a high risk for more acute and chronic complications which include CAD, CVA, CKD, retinopathy, and neuropathy. These are all discussed in detail with the patient.  - I have  counseled her on diet management and weight loss, by adopting a carbohydrate restricted/protein rich diet.  -  Suggestion is made for her to avoid simple carbohydrates  from her diet including Cakes, Sweet Desserts / Pastries, Ice Cream, Soda (diet and regular), Sweet Tea, Candies, Chips, Cookies, Store Bought Juices, Alcohol in Excess of  1-2 drinks a day, Artificial Sweeteners, and "Sugar-free" Products. This will help patient to have stable blood glucose profile and potentially avoid unintended weight gain.  - I encouraged her to switch to  unprocessed or minimally processed complex starch and increased protein intake (animal or plant source), fruits, and vegetables.  - she is advised to stick to a routine mealtimes to eat 3 meals  a day and avoid unnecessary snacks ( to snack only to correct hypoglycemia).   - she will be scheduled with Jearld Fenton, RDN, CDE for individualized diabetes education.  - I have approached her with the following individualized plan to manage diabetes and patient agrees:   - She will require intensive treatment with basal/bolus insulin to achieve control of diabetes to target , she appears  Committed  for proper monitoring of blood glucose 4 times a day-before meals and at bedtime.  - I'll proceed to lower her Lantus to 20 units daily at bedtime, initiate Humalog 5-11 units 3 times a day before meals for pre-meal blood glucose above 90 mg/dL.   - Patient is warned not to take insulin without proper monitoring per orders. - She will benefit from continuous glucose monitoring, I will send a prescription for the libre device for her.   -Patient is encouraged to call clinic for blood glucose levels less than 70 or above 300 mg /dl.  - I will discontinue glimepiride, risk outweighs benefit for this patient. -Patient is not a candidate for metformin,SGLT2 inhibitors due to CKD. - she will be considered for incretin therapy as appropriate next visit. - Patient  specific target  A1c;  LDL, HDL, Triglycerides, and  Waist Circumference were discussed in detail.  2) BP/HTN: Uncontrolled. I would increase her lisinopril to 20 mg by mouth every morning.  I advised her to Continue current  medications including ACEI/ARB. 3) Lipids/HPL:   Controlled, LDL 44.   Patient is advised to continue statins. 4)  Weight/Diet: CDE Consult will be initiated , exercise, and detailed carbohydrates information provided.  5) Chronic Care/Health Maintenance:  -she  is on ACEI/ARB and Statin medications and  is encouraged to continue to follow up with Ophthalmology, Dentist,  Podiatrist at least yearly or according to recommendations, and advised to  stay away from smoking. I have recommended yearly flu vaccine and pneumonia vaccination at least every 5 years; moderate intensity exercise for up to 150 minutes weekly; and  sleep for at least 7 hours a day.  - I advised patient to maintain close follow up with Redmond School, MD for primary care needs.  - Time spent with the patient: 25 min, of which >50% was spent in reviewing her sugar logs , discussing her hypo- and hyper-glycemic episodes, reviewing her current and  previous labs and insulin doses and developing a plan to avoid hypo- and hyper-glycemia.    Follow up plan: - Return in about 2 weeks (around 07/04/2017) for follow up with meter and logs- no labs.  Glade Lloyd, MD [facility] [address] [place], [place] [address] Phone: [phone]  Fax: [phone]    06/20/2017, 11:09 AM  This note was partially dictated with voice recognition software. Similar sounding words can be transcribed inadequately or may not  be corrected upon review.

## 2017-06-25 ENCOUNTER — Telehealth: Payer: Self-pay

## 2017-06-25 NOTE — Telephone Encounter (Signed)
Pts daughter left message on machine about high readings. Left message on her VM to call back with 2-3 days of BG readings.

## 2017-06-27 ENCOUNTER — Other Ambulatory Visit: Payer: Self-pay

## 2017-06-27 ENCOUNTER — Emergency Department (HOSPITAL_COMMUNITY)
Admission: EM | Admit: 2017-06-27 | Discharge: 2017-06-27 | Disposition: A | Discharge status: Home / Self Care | Payer: Medicare Other | Attending: Emergency Medicine | Admitting: Emergency Medicine

## 2017-06-27 ENCOUNTER — Encounter (HOSPITAL_COMMUNITY): Payer: Self-pay | Admitting: *Deleted

## 2017-06-27 DIAGNOSIS — R739 Hyperglycemia, unspecified: Secondary | ICD-10-CM | POA: Diagnosis present

## 2017-06-27 DIAGNOSIS — I2510 Atherosclerotic heart disease of native coronary artery without angina pectoris: Secondary | ICD-10-CM | POA: Diagnosis not present

## 2017-06-27 DIAGNOSIS — Z8673 Personal history of transient ischemic attack (TIA), and cerebral infarction without residual deficits: Secondary | ICD-10-CM | POA: Insufficient documentation

## 2017-06-27 DIAGNOSIS — Z79899 Other long term (current) drug therapy: Secondary | ICD-10-CM | POA: Diagnosis not present

## 2017-06-27 DIAGNOSIS — E0590 Thyrotoxicosis, unspecified without thyrotoxic crisis or storm: Secondary | ICD-10-CM | POA: Diagnosis not present

## 2017-06-27 DIAGNOSIS — N184 Chronic kidney disease, stage 4 (severe): Secondary | ICD-10-CM | POA: Insufficient documentation

## 2017-06-27 DIAGNOSIS — Z96651 Presence of right artificial knee joint: Secondary | ICD-10-CM | POA: Insufficient documentation

## 2017-06-27 DIAGNOSIS — Z7982 Long term (current) use of aspirin: Secondary | ICD-10-CM | POA: Insufficient documentation

## 2017-06-27 DIAGNOSIS — E1122 Type 2 diabetes mellitus with diabetic chronic kidney disease: Secondary | ICD-10-CM | POA: Diagnosis not present

## 2017-06-27 DIAGNOSIS — Z794 Long term (current) use of insulin: Secondary | ICD-10-CM | POA: Insufficient documentation

## 2017-06-27 DIAGNOSIS — E1165 Type 2 diabetes mellitus with hyperglycemia: Secondary | ICD-10-CM | POA: Diagnosis not present

## 2017-06-27 DIAGNOSIS — I129 Hypertensive chronic kidney disease with stage 1 through stage 4 chronic kidney disease, or unspecified chronic kidney disease: Secondary | ICD-10-CM | POA: Insufficient documentation

## 2017-06-27 DIAGNOSIS — D573 Sickle-cell trait: Secondary | ICD-10-CM | POA: Insufficient documentation

## 2017-06-27 DIAGNOSIS — I252 Old myocardial infarction: Secondary | ICD-10-CM | POA: Insufficient documentation

## 2017-06-27 LAB — BASIC METABOLIC PANEL
Anion gap: 11 (ref 5–15)
BUN: 32 mg/dL — AB (ref 6–20)
CHLORIDE: 108 mmol/L (ref 101–111)
CO2: 18 mmol/L — ABNORMAL LOW (ref 22–32)
Calcium: 10.5 mg/dL — ABNORMAL HIGH (ref 8.9–10.3)
Creatinine, Ser: 1.22 mg/dL — ABNORMAL HIGH (ref 0.44–1.00)
GFR calc Af Amer: 50 mL/min — ABNORMAL LOW (ref >60)
GFR calc non Af Amer: 43 mL/min — ABNORMAL LOW (ref >60)
GLUCOSE: 408 mg/dL — AB (ref 65–99)
POTASSIUM: 5 mmol/L (ref 3.5–5.1)
Sodium: 137 mmol/L (ref 135–145)

## 2017-06-27 LAB — CBC
HEMATOCRIT: 34.7 % — AB (ref 36.0–46.0)
Hemoglobin: 12.2 g/dL (ref 12.0–15.0)
MCH: 27.1 pg (ref 26.0–34.0)
MCHC: 35.2 g/dL (ref 30.0–36.0)
MCV: 77.1 fL — AB (ref 78.0–100.0)
Platelets: 151 10*3/uL (ref 150–400)
RBC: 4.5 MIL/uL (ref 3.87–5.11)
RDW: 14.5 % (ref 11.5–15.5)
WBC: 4.7 10*3/uL (ref 4.0–10.5)

## 2017-06-27 LAB — URINALYSIS, ROUTINE W REFLEX MICROSCOPIC
BILIRUBIN URINE: NEGATIVE
Glucose, UA: >=500 mg/dL — AB
Hgb urine dipstick: NEGATIVE
Ketones, ur: NEGATIVE mg/dL
Nitrite: NEGATIVE
Protein, ur: NEGATIVE mg/dL
SPECIFIC GRAVITY, URINE: 1.009 (ref 1.005–1.030)
pH: 5 (ref 5.0–8.0)

## 2017-06-27 LAB — CBG MONITORING, ED: GLUCOSE-CAPILLARY: 421 mg/dL — AB (ref 65–99)

## 2017-06-27 NOTE — Discharge Instructions (Signed)
Increase her evening Lantus to 30 units.  Continue using your Humalog sliding scale treatments.  Try to drink more water, to improve your hydration, until your blood sugar improves.  Call your doctor today for a follow-up appointment as soon as possible.

## 2017-06-27 NOTE — ED Notes (Signed)
State specialist took her off diabetic medication, has since went back on medication, however, blood sugar has not come back down

## 2017-06-27 NOTE — ED Triage Notes (Signed)
Pt c/o blood sugar greater than 400 this morning. Pt took 11 units of Humalog this morning. Pt c/o dizziness and headache. Pt reports she has been taking her diabetic medications as prescribed. Denies cough, fever. Pt c/o left leg pain that she states, "happens when my blood sugar goes up".

## 2017-06-27 NOTE — ED Provider Notes (Signed)
[facility] Provider Note   CSN: [id number] Arrival date & time: 06/27/17  1100     History   Chief Complaint Chief Complaint  Patient presents with  . Hyperglycemia    HPI Cassandra Adams is a 72 y.o. female.  Cassandra Adams presents for evaluation of persistent hyperglycemia despite adjusting her sliding scale Humalog up.  Cassandra Adams was here recently, 06/18/17, for hyperglycemia, and advised to restart her Humalog sliding scale.  Previously had been discontinued on 06/08/17.  Cassandra Adams denies fever, chills, nausea, vomiting, weakness or dizziness.  The changes sliding scale had been recommended by an endocrinologist, and the patient disagrees with this change.  Apparently her Lantus was also cut from 36-20 units at bedtime.  Patient does not plan on seeing the endocrinologist again.  Cassandra Adams states that Cassandra Adams watches her carbohydrate intake, but prefers to have her sugar run lower whereupon Cassandra Adams drinks "a Pepsi,", to bring it up.  Cassandra Adams has not seen her PCP, in the last several weeks.  There are no other known modifying factors.  HPI  Past Medical History:  Diagnosis Date  . Arthritis    "all over"  . CAD (coronary artery disease)    a. NSTEMI 12/2013 - occluded dominant RCA with L-R collaterals, moderate prox segmental LAD disease, for medical therapy initially, EF 60% with subtle inferobasal hypokinesia. Consider PCI for refractory CP.  [name] CKD (chronic kidney disease), stage III ([place])   . Hypertension   . Migraines    "weekly" (01/06/2014)  . Sickle cell trait ([place])   . TIA (transient ischemic attack) 1978  . Type II diabetes mellitus ([place])   . Vertigo     Patient Active Problem List   Diagnosis Date Noted  . DM type 2 causing vascular disease ([place]) 06/11/2017  . Type 2 diabetes mellitus with stage 4 chronic kidney disease, with long-term current use of insulin ([place]) 06/11/2017  . Carotid stenosis 02/05/2017  . TIA (transient ischemic attack) 01/20/2017  . Carotid stenosis,  right 01/20/2017  . Diabetic hyperosmolar non-ketotic state ([place]) 01/19/2017  . AKI (acute kidney injury) ([place]) 01/19/2017  . Left leg weakness 01/19/2017  . Dyslipidemia 11/24/2016  . Edema 03/30/2016  . Diaphoresis 02/18/2016  . Hypoglycemia 02/18/2016  . Bradycardia 04/22/2015  . CAD (coronary artery disease) 02/26/2014  . PAF (paroxysmal atrial fibrillation) ([place]) 02/26/2014  . NSTEMI (non-ST elevated myocardial infarction) ([place]) 01/03/2014  . Occlusion and stenosis of carotid artery without mention of cerebral infarction 11/21/2013  . Pain in limb-Left neck 11/21/2013  . Carotid stenosis, bilateral 11/17/2011  . HYPERTHYROIDISM, SUBCLINICAL 02/15/2010  . GERD 02/15/2010  . DYSPHAGIA UNSPECIFIED 02/15/2010  . ABDOMINAL PAIN, UNSPECIFIED SITE 02/15/2010  . Controlled insulin-dependent diabetes mellitus with neuropathy ([place]) 02/11/2010  . Essential hypertension, benign 02/11/2010  . HEADACHE 02/11/2010    Past Surgical History:  Procedure Laterality Date  . APPENDECTOMY  March 2014   had appendix frozen  . CARDIAC CATHETERIZATION  "years ago" & 01/05/2014  . CARDIOVERSION N/A 03/17/2014   Procedure: CARDIOVERSION;  Surgeon: [name], MD;  Location[facility] ENDOSCOPY;  Service: Cardiovascular;  Laterality: N/A;  . CATARACT EXTRACTION W/ INTRAOCULAR LENS  IMPLANT, BILATERAL Bilateral 04/2013-05/2013  . ENDARTERECTOMY Right 02/05/2017   Procedure: ENDARTERECTOMY CAROTID-RIGHT;  Surgeon: [name], MD;  [facility] OR;  Service: Vascular;  Laterality: Right;  . ESOPHAGOGASTRODUODENOSCOPY  11/08/2007    Normal esophagus without evidence of Barrett, mass, erosion/ Normal stomach, duodenal bulb  . GASTRIC MOTILITY STUDY  11/13/2007  mildly delayed emptying subjectively, but normal  analysis-77% of tracer emptied at 2 hours  . JOINT REPLACEMENT    . LAPAROSCOPIC APPENDECTOMY N/A 09/15/2012   Procedure: APPENDECTOMY LAPAROSCOPIC;  Surgeon: [name], MD;  Location: [facility]  ORS;  Service: General;  Laterality: N/A;  . LEFT HEART CATHETERIZATION WITH CORONARY ANGIOGRAM N/A 01/05/2014   Procedure: LEFT HEART CATHETERIZATION WITH CORONARY ANGIOGRAM;  Surgeon: [name], MD;  Location: [facility] CATH LAB;  Service: Cardiovascular;  Laterality: N/A;  . REPLACEMENT TOTAL KNEE Right 05-03-06  . TEE WITHOUT CARDIOVERSION N/A 03/17/2014   Procedure: TRANSESOPHAGEAL ECHOCARDIOGRAM (TEE);  Surgeon: [name], MD;  Location: [facility] ENDOSCOPY;  Service: Cardiovascular;  Laterality: N/A;  . TONSILLECTOMY  1972    OB History    Gravida Para Term Preterm AB Living   2 2 2     2    SAB TAB Ectopic Multiple Live Births                   Home Medications    Prior to Admission medications   Medication Sig Start Date End Date Taking? Authorizing Provider  amLODipine (NORVASC) 10 MG tablet Take 5 mg by mouth at bedtime.  02/25/16  Yes [provider]  aspirin 81 MG chewable tablet Chew 81 mg by mouth daily.   Yes [provider]  atorvastatin (LIPITOR) 80 MG tablet Take 1 tablet (80 mg total) by mouth every evening. Patient taking differently: Take 80 mg by mouth daily at 6 PM.  11/24/16  Yes Hilty, Nadean Corwin, MD  clotrimazole-betamethasone (LOTRISONE) cream Apply 1 application topically 3 (three) times daily. foot 01/13/14  Yes [provider]  colesevelam (WELCHOL) 625 MG tablet Take 1,875 mg by mouth 2 (two) times daily with a meal. *May take 6 tablets once a day with meals*   Yes [provider]  furosemide (LASIX) 40 MG tablet Take 1 tablet (40 mg total) by mouth every morning. 01/07/14  Yes Dunn, Dayna N, PA-C  glimepiride (AMARYL) 2 MG tablet Take 2 mg by mouth daily with breakfast.   Yes [provider]  HYDROcodone-acetaminophen (NORCO/VICODIN) 5-325 MG tablet Take 1 tablet by mouth every 4 (four) hours as needed (migraines). 02/06/17  Yes Laurence Slate M, PA-C  insulin glargine (LANTUS) 100 UNIT/ML injection Inject 37 Units into  the skin at bedtime.    Yes [provider]  insulin lispro (HUMALOG) 100 UNIT/ML injection Inject 5-11 Units into the skin 3 (three) times daily before meals.   Yes [provider]  lisinopril (PRINIVIL,ZESTRIL) 20 MG tablet Take 1 tablet (20 mg total) by mouth daily. 06/20/17  Yes Nida, Marella Chimes, MD  nitroGLYCERIN (NITROSTAT) 0.4 MG SL tablet Place 1 tablet (0.4 mg total) under the tongue every 5 (five) minutes as needed for chest pain (up to 3 doses). 11/24/16  Yes Hilty, Nadean Corwin, MD  tiZANidine (ZANAFLEX) 4 MG tablet Take 4 mg by mouth at bedtime.  12/09/15  Yes [provider]  [place] test strip  03/03/16   [provider]  ACCU-CHEK SOFTCLIX LANCETS lancets  03/03/16   [provider]  Continuous Blood Gluc Sensor ([place]) MISC Use one sensor every 10 days. 06/20/17   Cassandria Anger, MD    Family History Family History  Problem Relation Age of Onset  . Hyperlipidemia Mother   . Hypertension Mother   . Heart attack Mother   . Cancer Father  lung  . Hypertension Sister   . Diabetes Brother   . Heart disease Brother   . Hyperlipidemia Brother   . Hypertension Brother   . Heart attack Brother   . Other Brother        DVT    Social History Social History   Tobacco Use  . Smoking status: Never Smoker  . Smokeless tobacco: Never Used  Substance Use Topics  . Alcohol use: No  . Drug use: No     Allergies   Penicillins   Review of Systems Review of Systems  All other systems reviewed and are negative.    Physical Exam Updated Vital Signs BP (!) 141/53   Pulse 75   Temp 98.6 F (37 C)   Resp 20   Ht 5\' 6"  (1.676 m)   Wt 78 kg (172 lb)   SpO2 98%   BMI 27.76 kg/m   Physical Exam  Constitutional: Cassandra Adams is oriented to person, place, and time. Cassandra Adams appears well-developed. No distress (Nontoxic).  Elderly, frail  HENT:  Head: Normocephalic and atraumatic.  Eyes:  Conjunctivae and EOM are normal. Pupils are equal, round, and reactive to light.  Neck: Normal range of motion and phonation normal. Neck supple.  Cardiovascular: Normal rate and regular rhythm.  Pulmonary/Chest: Effort normal and breath sounds normal. Cassandra Adams exhibits no tenderness.  Abdominal: Soft. Cassandra Adams exhibits no distension. There is no tenderness. There is no guarding.  Musculoskeletal: Normal range of motion.  Neurological: Cassandra Adams is alert and oriented to person, place, and time. Cassandra Adams exhibits normal muscle tone.  Skin: Skin is warm and dry.  Psychiatric: Cassandra Adams has a normal mood and affect. Her behavior is normal. Judgment and thought content normal.  Nursing note and vitals reviewed.    ED Treatments / Results  Labs (all labs ordered are listed, but only abnormal results are displayed) Labs Reviewed  BASIC METABOLIC PANEL - Abnormal; Notable for the following components:      Result Value   CO2 18 (*)    Glucose, Bld 408 (*)    BUN 32 (*)    Creatinine, Ser 1.22 (*)    Calcium 10.5 (*)    GFR calc non Af Amer 43 (*)    GFR calc Af Amer 50 (*)    All other components within normal limits  CBC - Abnormal; Notable for the following components:   HCT 34.7 (*)    MCV 77.1 (*)    All other components within normal limits  URINALYSIS, ROUTINE W REFLEX MICROSCOPIC - Abnormal; Notable for the following components:   Color, Urine STRAW (*)    Glucose, UA >=500 (*)    Leukocytes, UA MODERATE (*)    Bacteria, UA RARE (*)    Squamous Epithelial / LPF 0-5 (*)    All other components within normal limits  CBG MONITORING, ED - Abnormal; Notable for the following components:   Glucose-Capillary 421 (*)    All other components within normal limits    EKG  EKG Interpretation None       Radiology No results found.  Procedures Procedures (including critical care time)  Medications Ordered in ED Medications - No data to display   Initial Impression / Assessment and Plan / ED Course   I have reviewed the triage vital signs and the nursing notes.  Pertinent labs & imaging results that were available during my care of the patient were reviewed by me and considered in my medical decision making (see chart for  details).      Patient Vitals for the past 24 hrs:  BP Temp Pulse Resp SpO2 Height Weight  06/27/17 1230 (!) 141/53 - 75 - 98 % - -  06/27/17 1153 (!) 158/73 98.6 F (37 C) 79 20 99 % - -  06/27/17 1151 - - - - - 5\' 6"  (1.676 m) 78 kg (172 lb)    At discharge- reevaluation with update and discussion. After initial assessment and treatment, an updated evaluation reveals no change in clinical status, Cassandra Adams is comfortable.  Findings discussed with the patient and all questions answered. Daleen Bo     Final Clinical Impressions(s) / ED Diagnoses   Final diagnoses:  Hyperglycemia    Hyperglycemia, nontoxic patient, recently changed insulin dosing.  Doubt metabolic instability, acute infectious process or significant metabolic abnormality.  Nursing Notes Reviewed/ Care Coordinated Applicable Imaging Reviewed Interpretation of Laboratory Data incorporated into ED treatment  The patient appears reasonably screened and/or stabilized for discharge and I doubt any other medical condition or other [facility] requiring further screening, evaluation, or treatment in the ED at this time prior to discharge.  Plan: Home Medications-increase evening Lantus to 30 units, and resume usual Humalog sliding scale.; Home Treatments-strict carbohydrate limitation diet; return here if the recommended treatment, does not improve the symptoms; Recommended follow up-PCP checkup as soon as possible.   ED Discharge Orders    None       Daleen Bo, MD 06/27/17 [phone]

## 2017-07-04 ENCOUNTER — Ambulatory Visit: Payer: Medicare Other | Admitting: "Endocrinology

## 2017-07-25 ENCOUNTER — Ambulatory Visit: Payer: Medicare Other | Admitting: Nutrition

## 2017-08-24 ENCOUNTER — Other Ambulatory Visit: Payer: Self-pay | Admitting: Internal Medicine

## 2017-08-30 ENCOUNTER — Ambulatory Visit (HOSPITAL_COMMUNITY): Payer: Medicare Other | Attending: Family

## 2017-08-30 ENCOUNTER — Ambulatory Visit: Payer: Medicare Other | Admitting: Family

## 2017-10-04 DIAGNOSIS — T50905A Adverse effect of unspecified drugs, medicaments and biological substances, initial encounter: Secondary | ICD-10-CM | POA: Diagnosis not present

## 2017-10-04 DIAGNOSIS — E1129 Type 2 diabetes mellitus with other diabetic kidney complication: Secondary | ICD-10-CM | POA: Diagnosis not present

## 2017-10-04 DIAGNOSIS — E1165 Type 2 diabetes mellitus with hyperglycemia: Secondary | ICD-10-CM | POA: Diagnosis not present

## 2017-10-04 DIAGNOSIS — R600 Localized edema: Secondary | ICD-10-CM | POA: Diagnosis not present

## 2017-11-26 ENCOUNTER — Encounter: Payer: Self-pay | Admitting: Internal Medicine

## 2017-11-26 ENCOUNTER — Ambulatory Visit: Payer: Medicare Other | Admitting: Internal Medicine

## 2017-11-26 VITALS — BP 169/82 | HR 80 | Ht 66.0 in | Wt 176.0 lb

## 2017-11-26 DIAGNOSIS — IMO0001 Reserved for inherently not codable concepts without codable children: Secondary | ICD-10-CM

## 2017-11-26 DIAGNOSIS — E1140 Type 2 diabetes mellitus with diabetic neuropathy, unspecified: Secondary | ICD-10-CM

## 2017-11-26 DIAGNOSIS — I2583 Coronary atherosclerosis due to lipid rich plaque: Secondary | ICD-10-CM | POA: Diagnosis not present

## 2017-11-26 DIAGNOSIS — I2510 Atherosclerotic heart disease of native coronary artery without angina pectoris: Secondary | ICD-10-CM

## 2017-11-26 DIAGNOSIS — E785 Hyperlipidemia, unspecified: Secondary | ICD-10-CM | POA: Diagnosis not present

## 2017-11-26 DIAGNOSIS — Z794 Long term (current) use of insulin: Secondary | ICD-10-CM | POA: Diagnosis not present

## 2017-11-26 DIAGNOSIS — I10 Essential (primary) hypertension: Secondary | ICD-10-CM

## 2017-11-26 MED ORDER — ELIQUIS 5 MG PO TABS: 5.0000 mg | ORAL_TABLET | Freq: Two times a day (BID) | ORAL | 3 refills | Status: DC

## 2017-11-26 MED ORDER — NITROGLYCERIN 0.4 MG SL SUBL: 0.4000 mg | SUBLINGUAL_TABLET | SUBLINGUAL | 3 refills | Status: DC | PRN

## 2017-11-26 MED ORDER — CARVEDILOL 6.25 MG PO TABS: 6.2500 mg | ORAL_TABLET | Freq: Two times a day (BID) | ORAL | 3 refills | Status: DC

## 2017-11-26 MED ORDER — ATORVASTATIN CALCIUM 80 MG PO TABS: 80.0000 mg | ORAL_TABLET | Freq: Every evening | ORAL | 3 refills | Status: DC

## 2017-11-26 NOTE — Patient Instructions (Signed)
Medication Instructions:   STOP losartan START carvedilol 6.25mg  twice daily  Labwork:  FASTING lab work in [place] - CMET, lipid  Testing/Procedures:  NONE  Follow-Up:  Your physician wants you to follow-up in: ONE YEAR with Dr. Debara Pickett. You will receive a reminder letter in the mail two months in advance. If you don't receive a letter, please call our office to schedule the follow-up appointment.  If you need a refill on your cardiac medications before your next appointment, please call your pharmacy.  Any Other Special Instructions Will Be Listed Below (If Applicable).

## 2017-11-26 NOTE — Progress Notes (Signed)
11/26/2017 Cassandra Adams   04/27/1946  [id number]  Primary Physicia [employer], MD Primary Cardiologist: Dr. Debara Pickett  CC: Routine follow-up  HPI:  The patient is a 72 year old African American female with a history of diabetes, hypertension, prior stroke but no prior history of CAD, who initially presented to [facility] on 01/03/2014 with a complaint of chest pain. Initial troponin was elevated at 15.7 and EKG was with nonspecific ST-T changes. She was placed on IV heparin and IV nitroglycerin and was transferred to [facility] for further treatment. Her chest pain resolved. She underwent a diagnostic left heart catheterization, perform by Dr. Gwenlyn Found which demonstrated occlusion of the midportion of the RCA, which was felt to be the infarct-related artery. However, she had grade 3 left right collaterals. She also has moderate LAD artery disease with 50% segmental stenosis in the proximal portion with a focal area of 60-70% stenosis. The left main and left circumflex were both normal. Left ventricular systolic function was normal with an estimated ejection fraction of 60%. Wall motion abnormalities were notable for sublte inferior basal hypokinesia. Given her lack of ongoing symptoms and preserved LV function with excellent collaterals, it was recommended to treat with medical therapy. It was felt that if she were to develop recurrent chest pain then she would be a candidate for PCI and stenting of her RCA. She left the Cath Lab in stable condition and had no post-cath complications and no recurrent chest pain. She was placed on dual antiplatelet therapy with aspirin plus Plavix. She was also placed on a beta blocker, an ACE inhibitor and statin. Also notable this admission, was a hemoglobin A1c level of 8.1. She was instructed to followup with her PCP for better management of her diabetes.  She was discharged home on 01/06/2014.  She presents to clinic today for post hospital  followup. She is accompanied by her daughter. She denies any recurrent anginal symptoms. She also denies dyspnea, syncope/near-syncope. Yesterday however, she did note feeling a sensation of increased warmth and mild dizziness, that was self-limiting. This lasted only a minute or 2. Again, she had no syncope/near-syncope. She denies any further recurrence. She has been fully compliant with her medications.  Cassandra Adams is seen in followup today. Her EKG demonstrates a sinus rhythm today. However I suspect that she continues to have PAF. Unfortunate she took herself off of Xarelto after experiencing some oral bleeding. She did not notify the office and has not been on any anticoagulation for several months. We discussed her higher than normal risk of stroke, based on a CHADSVASC score of 5. She reports only one episode of angina in January, however it was very short-lived and sharp pain. She took a nitroglycerin and the symptoms went away after 10 or 15 minutes, suggesting this may not eventually been angina.  I saw Cassandra Adams today back in the office. Overall she is feeling well. She denies any chest pain or shortness of breath. She's not had any further episodes of A. fib. She is noted to be mildly bradycardic today. Blood pressure has run somewhat high for a while.  02/18/2016  Cassandra Adams returns today for follow-up. She reports she's been having some episodes of what sounds like night sweats. She wakes up at night fairly soaked. These don't happen every night and is been no associated weight loss, change in appetite or other constitutional symptoms. She reports she is long past her menopause symptoms. She continues to have morning hypoglycemia with  blood sugars in the 40s. I previously asked to decrease her Lantus insulin. She says that her endocrinologist is aware of this and she has an appointment with him in September. She is not checking her sugars in the middle the night but I'm suspicious of  hypoglycemia. Other possible causes would include autonomic dysfunction, she does have gastroparesis and peripheral neuropathy. In addition she could be having other symptoms such as reflux or other things which cause vagal stimulation and diaphoresis. This is very atypical for coronary disease and there is no associated chest pain or worsening shortness of breath.  03/30/2016  Cassandra Adams was seen back today for follow-up. She reports her hot flashes have resolved. Unfortunately even though we decreased her insulin, she had some recurrent hypoglycemia and ended up in the emergency department. She is now having problems with hyperglycemia. 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 She denies any chest pain or worsening shortness of breath. She does have some chronic lower extremity edema which is likely from neuropathy. I've recommended bilateral knee-high 20-30 mmHg compression stockings.  11/24/2016  Cassandra Adams returns for follow-up. She reported December she had to take one nitroglycerin. She was very active that day and it seemed to resolve her symptoms fairly quickly. She's not required any more nitroglycerin. She is asking for refills of that today. She continues to have problems with hypoglycemia although does seem she has awareness. Recently she had a few episodes of spontaneous diaphoresis. This was not associated with any chest pain and testing of her blood sugar indicated hypoglycemia so I suspect that this is more hypoglycemia awareness. She reports some lower strandy swelling which we discussed in the past would be amenable to compression stockings. She is on daily Lasix. Blood pressure was initially elevated 158/62 of her came out a 134/60. She has managed to lose 10 pounds since I last saw her which I commended her on as well.  11/26/2017  Cassandra Adams returns today for routine follow-up.  Overall she is doing well.   She has not required any nitroglycerin since I last saw her.  She denies any bleeding problems on Eliquis.  She is out of some medications including atorvastatin and nearly out of Eliquis.  She also lost her nitroglycerin and needs a refill.  She reports reasonably good blood sugar control.  Her blood pressure has remained elevated.  Recently her PCP started her on losartan, however she ready was on lisinopril.  She is not on any additional blood pressure medications.  Blood pressure today was 169/82.  Given her history of coronary disease, she would be a good candidate for beta-blocker.  EKG shows sinus rhythm with PVCs in fact today and nonspecific T wave changes at 80.   Current Outpatient Medications  Medication Sig Dispense Refill  . ACCU-CHEK AVIVA PLUS test strip     . ACCU-CHEK SOFTCLIX LANCETS lancets     . aspirin EC 81 MG tablet Take 81 mg by mouth daily.    [name] atorvastatin (LIPITOR) 80 MG tablet TAKE ONE TABLET BY MOUTH IN THE EVENING. 90 tablet 0  . colesevelam (WELCHOL) 625 MG tablet Take 1,875 mg by mouth 2 (two) times daily with a meal. *May take 6 tablets once a day with meals*    . Continuous Blood Gluc Sensor (FREESTYLE LIBRE SENSOR SYSTEM) MISC Use one sensor every 10 days. 3 each 2  . ELIQUIS 5 MG TABS tablet Take 1 tablet  by mouth 2 (two) times daily.    . furosemide (LASIX) 40 MG tablet Take 1 tablet by mouth 2 (two) times daily as needed.    [name] glimepiride (AMARYL) 2 MG tablet Take 8 mg by mouth daily with breakfast.     . insulin glargine (LANTUS) 100 UNIT/ML injection Inject 54 Units into the skin at bedtime.     . insulin lispro (HUMALOG) 100 UNIT/ML injection Inject 10 Units into the skin 3 (three) times daily before meals.     [name] lisinopril (PRINIVIL,ZESTRIL) 20 MG tablet Take 1 tablet (20 mg total) by mouth daily. 30 tablet 3  . losartan (COZAAR) 50 MG tablet Take 1 tablet by mouth daily.    . nitroGLYCERIN (NITROSTAT) 0.4 MG SL tablet Place 1 tablet (0.4 mg total) under  the tongue every 5 (five) minutes as needed for chest pain (up to 3 doses). 25 tablet 3  . nystatin cream (MYCOSTATIN) Apply 1 application topically daily as needed.    [name] tiZANidine (ZANAFLEX) 4 MG tablet Take 4 mg by mouth at bedtime.      No current facility-administered medications for this visit.     Allergies  Allergen Reactions  . Penicillins Hives    Has patient had a PCN reaction causing immediate rash, facial/tongue/throat swelling, SOB or lightheadedness with hypotension: no Has patient had a PCN reaction causing severe rash involving mucus membranes or skin necrosis: No  Has patient had a PCN reaction that required hospitalization: no Has patient had a PCN reaction occurring within the last 10 years: no If all of the above answers are "NO", then may proceed with Cephalosporin use.     Social History   Socioeconomic History  . Marital status: Divorced    Spouse name: Not on file  . Number of children: Not on file  . Years of education: Not on file  . Highest education level: Not on file  Occupational History  . Not on file  Social Needs  . Financial resource strain: Not on file  . Food insecurity:    Worry: Not on file    Inability: Not on file  . Transportation needs:    Medical: Not on file    Non-medical: Not on file  Tobacco Use  . Smoking status: Never Smoker  . Smokeless tobacco: Never Used  Substance and Sexual Activity  . Alcohol use: No  . Drug use: No  . Sexual activity: Not Currently    Birth control/protection: Post-menopausal  Lifestyle  . Physical activity:    Days per week: Not on file    Minutes per session: Not on file  . Stress: Not on file  Relationships  . Social connections:    Talks on phone: Not on file    Gets together: Not on file    Attends religious service: Not on file    Active member of club or organization: Not on file    Attends meetings of clubs or organizations: Not on file    Relationship status: Not on file  .  Intimate partner violence:    Fear of current or ex partner: Not on file    Emotionally abused: Not on file    Physically abused: Not on file    Forced sexual activity: Not on file  Other Topics Concern  . Not on file  Social History Narrative  . Not on file     Review of Systems: Pertinent items noted in HPI and remainder of comprehensive ROS otherwise negative.  Blood pressure (!) 169/82, pulse 80, height 5\' 6"  (1.676 m), weight 176 lb (79.8 kg).  General appearance: alert, no distress and mildly obese Neck: no carotid bruit and no JVD Lungs: clear to auscultation bilaterally Heart: Regular bradycardia Abdomen: soft, non-tender; bowel sounds normal; no masses,  no organomegaly Extremities: edema trace pedal Pulses: 2+ and symmetric Skin: Skin color, texture, turgor normal. No rashes or lesions Neurologic: Grossly normal Psych: Pleasant  EKG Times rhythm with PVCs at 80, nonspecific T wave changes-personally reviewed  PROBLEM LIST: 1. CAD status post PCI to the RCA in 2015 2. PAF - CHADSVASC score of 5 on Eliquis and ASA for CAD 3. Hypertension 4. Dyslipidemia 5. IDDM with neuropathy 6. Carotid artery stenosis/occlusion - mild 2016 7. Leg edema  ASSESSMENT AND PLAN:  Cassandra Adams seems to be doing well without any recurrent chest pain and no need for nitroglycerin.  Her blood pressure remains elevated.  She was started on losartan but is already on lisinopril.  We will discontinue the losartan (especially since her been many recalls) and start her on carvedilol 6.25 mg twice daily.  This should help with both heart rate blood pressure and her history of coronary disease as well as intermittent A. fib.  She remains on Eliquis without bleeding issues.  Her cholesterol has been well controlled although is due for reassessment.  We will reorder atorvastatin, Eliquis and nitroglycerin since she is out of these medicines today.  Plan follow-up with me annually or sooner as  necessary.  [name], MD, [facility], [place] Director of the [facility]  [facility] Diplomate of the American Board of Clinical Lipidology Attending Cardiologist  Direct Dial: [phone]  Fax: [phone]  Website:  [email or web address] 11/26/2017 2:42 PM

## 2017-12-03 DIAGNOSIS — E785 Hyperlipidemia, unspecified: Secondary | ICD-10-CM | POA: Diagnosis not present

## 2017-12-04 LAB — COMPREHENSIVE METABOLIC PANEL
ALK PHOS: 132 IU/L — AB (ref 39–117)
ALT: 24 IU/L (ref 0–32)
AST: 26 IU/L (ref 0–40)
Albumin/Globulin Ratio: 1.3 (ref 1.2–2.2)
Albumin: 3.5 g/dL (ref 3.5–4.8)
BILIRUBIN TOTAL: 0.2 mg/dL (ref 0.0–1.2)
BUN/Creatinine Ratio: 15 (ref 12–28)
BUN: 18 mg/dL (ref 8–27)
CHLORIDE: 110 mmol/L — AB (ref 96–106)
CO2: 22 mmol/L (ref 20–29)
CREATININE: 1.21 mg/dL — AB (ref 0.57–1.00)
Calcium: 10.1 mg/dL (ref 8.7–10.3)
GFR calc Af Amer: 51 mL/min/{1.73_m2} — ABNORMAL LOW (ref >59)
GFR calc non Af Amer: 44 mL/min/{1.73_m2} — ABNORMAL LOW (ref >59)
GLUCOSE: 243 mg/dL — AB (ref 65–99)
Globulin, Total: 2.8 g/dL (ref 1.5–4.5)
Potassium: 4.7 mmol/L (ref 3.5–5.2)
Sodium: 145 mmol/L — ABNORMAL HIGH (ref 134–144)
Total Protein: 6.3 g/dL (ref 6.0–8.5)

## 2017-12-04 LAB — LIPID PANEL
CHOLESTEROL TOTAL: 110 mg/dL (ref 100–199)
Chol/HDL Ratio: 1.9 ratio (ref 0.0–4.4)
HDL: 58 mg/dL (ref >39)
LDL CALC: 42 mg/dL (ref 0–99)
Triglycerides: 50 mg/dL (ref 0–149)
VLDL CHOLESTEROL CAL: 10 mg/dL (ref 5–40)

## 2017-12-06 ENCOUNTER — Other Ambulatory Visit: Payer: Self-pay

## 2017-12-06 MED ORDER — ELIQUIS 5 MG PO TABS: 5.0000 mg | ORAL_TABLET | Freq: Two times a day (BID) | ORAL | 3 refills | Status: DC

## 2017-12-06 NOTE — Telephone Encounter (Signed)
Pt states that she need Eliquis refills

## 2017-12-07 ENCOUNTER — Telehealth: Payer: Self-pay | Admitting: Internal Medicine

## 2017-12-07 MED ORDER — LISINOPRIL 20 MG PO TABS: 40.0000 mg | ORAL_TABLET | Freq: Every day | ORAL | 3 refills | Status: DC

## 2017-12-07 NOTE — Telephone Encounter (Signed)
New Message   Patient is calling because she was advised to keep a record of her BP. She was calling to provide:   06/04 154/72 pulse 71 06/05 145/71 pulse 66 06/06 137/72 pulse 73 06/07 148/64 pulse 65 06/08 140/58 pulse 64 06/09 140/65 pulse 52 06/10 163/78 pulse 69 06/11 147/62 pulse 70 06/12 167/67 pulse 60 06/13 138/59 pulse 60 06/14 147/68 pulse 65    Please call.

## 2017-12-07 NOTE — Telephone Encounter (Signed)
Spoke with pt and advised of Dr. Lysbeth Penner recommendation. Pt verbalized understanding. Orders placed.

## 2017-12-07 NOTE — Telephone Encounter (Signed)
Spoke with pt with pt who states she was instructed to check her BP daily and call back with recording. Results are as followed.   06/04 154/72 pulse 71 06/05 145/71 pulse 66 06/06 137/72 pulse 73 06/07 148/64 pulse 65 06/08 140/58 pulse 64 06/09 146/86 pulse 57 06/10 142/87 pulse 60 06/11 146/80 pulse 54 06/12 139/78 pulse 55 06/13 147/89 pulse 57 06/14 146/84 pulse 65   Routing to Dr. Debara Pickett

## 2017-12-07 NOTE — Telephone Encounter (Signed)
BP looks better, but not at goal. Would increase lisinopril to 40 mg daily (2 tablets for now). Continue same dose of coreg.  Dr. Lemmie Evens

## 2018-01-30 DIAGNOSIS — K219 Gastro-esophageal reflux disease without esophagitis: Secondary | ICD-10-CM | POA: Diagnosis not present

## 2018-01-30 DIAGNOSIS — Z1389 Encounter for screening for other disorder: Secondary | ICD-10-CM | POA: Diagnosis not present

## 2018-01-30 DIAGNOSIS — R7309 Other abnormal glucose: Secondary | ICD-10-CM | POA: Diagnosis not present

## 2018-01-30 DIAGNOSIS — Z0000 Encounter for general adult medical examination without abnormal findings: Secondary | ICD-10-CM | POA: Diagnosis not present

## 2018-01-30 DIAGNOSIS — I10 Essential (primary) hypertension: Secondary | ICD-10-CM | POA: Diagnosis not present

## 2018-01-30 DIAGNOSIS — E1151 Type 2 diabetes mellitus with diabetic peripheral angiopathy without gangrene: Secondary | ICD-10-CM | POA: Diagnosis not present

## 2018-01-30 DIAGNOSIS — N183 Chronic kidney disease, stage 3 (moderate): Secondary | ICD-10-CM | POA: Diagnosis not present

## 2018-01-30 DIAGNOSIS — Z0001 Encounter for general adult medical examination with abnormal findings: Secondary | ICD-10-CM | POA: Diagnosis not present

## 2018-01-30 DIAGNOSIS — K7689 Other specified diseases of liver: Secondary | ICD-10-CM | POA: Diagnosis not present

## 2018-02-18 ENCOUNTER — Other Ambulatory Visit: Payer: Self-pay

## 2018-02-18 ENCOUNTER — Emergency Department (HOSPITAL_COMMUNITY)
Admission: EM | Admit: 2018-02-18 | Discharge: 2018-02-18 | Disposition: A | Discharge status: Home / Self Care | Payer: Medicare Other | Attending: Emergency Medicine | Admitting: Emergency Medicine

## 2018-02-18 ENCOUNTER — Encounter (HOSPITAL_COMMUNITY): Payer: Self-pay | Admitting: Emergency Medicine

## 2018-02-18 DIAGNOSIS — I129 Hypertensive chronic kidney disease with stage 1 through stage 4 chronic kidney disease, or unspecified chronic kidney disease: Secondary | ICD-10-CM | POA: Diagnosis not present

## 2018-02-18 DIAGNOSIS — Z794 Long term (current) use of insulin: Secondary | ICD-10-CM | POA: Diagnosis not present

## 2018-02-18 DIAGNOSIS — E11649 Type 2 diabetes mellitus with hypoglycemia without coma: Secondary | ICD-10-CM | POA: Insufficient documentation

## 2018-02-18 DIAGNOSIS — Z7982 Long term (current) use of aspirin: Secondary | ICD-10-CM | POA: Insufficient documentation

## 2018-02-18 DIAGNOSIS — I2510 Atherosclerotic heart disease of native coronary artery without angina pectoris: Secondary | ICD-10-CM | POA: Insufficient documentation

## 2018-02-18 DIAGNOSIS — E162 Hypoglycemia, unspecified: Secondary | ICD-10-CM

## 2018-02-18 DIAGNOSIS — E1122 Type 2 diabetes mellitus with diabetic chronic kidney disease: Secondary | ICD-10-CM | POA: Insufficient documentation

## 2018-02-18 DIAGNOSIS — R000 Tachycardia, unspecified: Secondary | ICD-10-CM | POA: Diagnosis not present

## 2018-02-18 DIAGNOSIS — I10 Essential (primary) hypertension: Secondary | ICD-10-CM | POA: Diagnosis not present

## 2018-02-18 DIAGNOSIS — R5383 Other fatigue: Secondary | ICD-10-CM | POA: Diagnosis not present

## 2018-02-18 DIAGNOSIS — Z79899 Other long term (current) drug therapy: Secondary | ICD-10-CM | POA: Insufficient documentation

## 2018-02-18 DIAGNOSIS — N184 Chronic kidney disease, stage 4 (severe): Secondary | ICD-10-CM | POA: Insufficient documentation

## 2018-02-18 DIAGNOSIS — E161 Other hypoglycemia: Secondary | ICD-10-CM | POA: Diagnosis not present

## 2018-02-18 LAB — CBG MONITORING, ED
GLUCOSE-CAPILLARY: 241 mg/dL — AB (ref 70–99)
Glucose-Capillary: 63 mg/dL — ABNORMAL LOW (ref 70–99)
Glucose-Capillary: 258 mg/dL — ABNORMAL HIGH (ref 70–99)

## 2018-02-18 LAB — CBC WITH DIFFERENTIAL/PLATELET
Basophils Absolute: 0 10*3/uL (ref 0.0–0.1)
Basophils Relative: 1 %
EOS PCT: 6 %
Eosinophils Absolute: 0.2 10*3/uL (ref 0.0–0.7)
HCT: 43.7 % (ref 36.0–46.0)
Hemoglobin: 15.3 g/dL — ABNORMAL HIGH (ref 12.0–15.0)
LYMPHS ABS: 1.2 10*3/uL (ref 0.7–4.0)
LYMPHS PCT: 31 %
MCH: 27.3 pg (ref 26.0–34.0)
MCHC: 35 g/dL (ref 30.0–36.0)
MCV: 77.9 fL — AB (ref 78.0–100.0)
MONO ABS: 0.4 10*3/uL (ref 0.1–1.0)
Monocytes Relative: 10 %
Neutro Abs: 2.1 10*3/uL (ref 1.7–7.7)
Neutrophils Relative %: 52 %
PLATELETS: 145 10*3/uL — AB (ref 150–400)
RBC: 5.61 MIL/uL — AB (ref 3.87–5.11)
RDW: 15.5 % (ref 11.5–15.5)
WBC: 4 10*3/uL (ref 4.0–10.5)

## 2018-02-18 LAB — HEPATIC FUNCTION PANEL
ALBUMIN: 3.6 g/dL (ref 3.5–5.0)
ALK PHOS: 147 U/L — AB (ref 38–126)
ALT: 88 U/L — AB (ref 0–44)
AST: 130 U/L — AB (ref 15–41)
BILIRUBIN DIRECT: 0.2 mg/dL (ref 0.0–0.2)
BILIRUBIN TOTAL: 0.6 mg/dL (ref 0.3–1.2)
Indirect Bilirubin: 0.4 mg/dL (ref 0.3–0.9)
Total Protein: 7.9 g/dL (ref 6.5–8.1)

## 2018-02-18 LAB — BASIC METABOLIC PANEL
Anion gap: 7 (ref 5–15)
BUN: 11 mg/dL (ref 8–23)
CO2: 27 mmol/L (ref 22–32)
Calcium: 10 mg/dL (ref 8.9–10.3)
Chloride: 108 mmol/L (ref 98–111)
Creatinine, Ser: 1 mg/dL (ref 0.44–1.00)
GFR calc Af Amer: >60 mL/min (ref >60)
GFR, EST NON AFRICAN AMERICAN: 55 mL/min — AB (ref >60)
GLUCOSE: 99 mg/dL (ref 70–99)
POTASSIUM: 4.3 mmol/L (ref 3.5–5.1)
Sodium: 142 mmol/L (ref 135–145)

## 2018-02-18 MED ORDER — DEXTROSE 50 % IV SOLN
INTRAVENOUS | Status: AC
  Filled 2018-02-18: qty 50

## 2018-02-18 MED ORDER — LISINOPRIL 10 MG PO TABS
40.0000 mg | ORAL_TABLET | Freq: Once | ORAL | Status: AC
  Administered 2018-02-18: 40 mg via ORAL
  Filled 2018-02-18: qty 4

## 2018-02-18 MED ORDER — CARVEDILOL 12.5 MG PO TABS
6.2500 mg | ORAL_TABLET | Freq: Once | ORAL | Status: AC
  Administered 2018-02-18: 6.25 mg via ORAL
  Filled 2018-02-18: qty 1

## 2018-02-18 MED ORDER — DEXTROSE 50 % IV SOLN
25.0000 mL | Freq: Once | INTRAVENOUS | Status: AC
  Administered 2018-02-18: 25 mL via INTRAVENOUS

## 2018-02-18 NOTE — ED Notes (Signed)
Meal tray given 

## 2018-02-18 NOTE — ED Triage Notes (Signed)
Pt c/o gen weakness x 1 week. Ems called for possible stroke, pt from home and family states pt was not acting like herself. Ems arrived with some lethargy, cbg was 51. Ems gave glucose paste. Pt arrived a/o. No weakness.

## 2018-02-18 NOTE — ED Provider Notes (Signed)
[facility]: Cassandra Adams EMERGENCY DEPARTMENT Provider Note   CSN: [id number] Arrival date & time: 02/18/18  1216     History   Chief Complaint Chief Complaint  Patient presents with  . Hypoglycemia    HPI Cassandra Adams is a 72 y.o. female.  Patient was found by family confused today.  When the paramedics arrived her blood sugar was 51.  When she arrived to the emergency department she was alert and oriented and her sugar did come up.  Patient's sugar has been running low lately  The history is provided by the patient and a relative. No language interpreter was used.  Illness  This is a recurrent problem. The current episode started 12 to 24 hours ago. The problem occurs every several days. The problem has been resolved. Pertinent negatives include no chest pain, no abdominal pain and no headaches. Nothing aggravates the symptoms. Nothing relieves the symptoms. She has tried nothing for the symptoms. The treatment provided no relief.    Past Medical History:  Diagnosis Date  . Arthritis    "all over"  . CAD (coronary artery disease)    a. NSTEMI 12/2013 - occluded dominant RCA with L-R collaterals, moderate prox segmental LAD disease, for medical therapy initially, EF 60% with subtle inferobasal hypokinesia. Consider PCI for refractory CP.  [name] CKD (chronic kidney disease), stage III ([place])   . Hypertension   . Migraines    "weekly" (01/06/2014)  . Sickle cell trait ([place])   . TIA (transient ischemic attack) 1978  . Type II diabetes mellitus ([place])   . Vertigo     Patient Active Problem List   Diagnosis Date Noted  . DM type 2 causing vascular disease ([place]) 06/11/2017  . Type 2 diabetes mellitus with stage 4 chronic kidney disease, with long-term current use of insulin ([place]) 06/11/2017  . Carotid stenosis 02/05/2017  . TIA (transient ischemic attack) 01/20/2017  . Carotid stenosis, right 01/20/2017  . Diabetic hyperosmolar non-ketotic state ([place]) 01/19/2017  . AKI (acute kidney  injury) ([place]) 01/19/2017  . Left leg weakness 01/19/2017  . Dyslipidemia 11/24/2016  . Edema 03/30/2016  . Diaphoresis 02/18/2016  . Hypoglycemia 02/18/2016  . Bradycardia 04/22/2015  . Coronary artery disease due to lipid rich plaque 02/26/2014  . PAF (paroxysmal atrial fibrillation) ([place]) 02/26/2014  . NSTEMI (non-ST elevated myocardial infarction) ([place]) 01/03/2014  . Occlusion and stenosis of carotid artery without mention of cerebral infarction 11/21/2013  . Pain in limb-Left neck 11/21/2013  . Carotid stenosis, bilateral 11/17/2011  . HYPERTHYROIDISM, SUBCLINICAL 02/15/2010  . GERD 02/15/2010  . DYSPHAGIA UNSPECIFIED 02/15/2010  . ABDOMINAL PAIN, UNSPECIFIED SITE 02/15/2010  . Controlled insulin-dependent diabetes mellitus with neuropathy ([place]) 02/11/2010  . Essential hypertension 02/11/2010  . HEADACHE 02/11/2010    Past Surgical History:  Procedure Laterality Date  . APPENDECTOMY  March 2014   had appendix frozen  . CARDIAC CATHETERIZATION  "years ago" & 01/05/2014  . CARDIOVERSION N/A 03/17/2014   Procedure: CARDIOVERSION;  Surgeon: [name], MD;  Location[facility] ENDOSCOPY;  Service: Cardiovascular;  Laterality: N/A;  . CATARACT EXTRACTION Adams/ INTRAOCULAR LENS  IMPLANT, BILATERAL Bilateral 04/2013-05/2013  . ENDARTERECTOMY Right 02/05/2017   Procedure: ENDARTERECTOMY CAROTID-RIGHT;  Surgeon: [name], MD;  Location: [facility] OR;  Service: Vascular;  Laterality: Right;  . ESOPHAGOGASTRODUODENOSCOPY  11/08/2007    Normal esophagus without evidence of Barrett, mass, erosion/ Normal stomach, duodenal bulb  . GASTRIC MOTILITY STUDY  11/13/2007   mildly delayed emptying subjectively, but normal  analysis-77%  of tracer emptied at 2 hours  . JOINT REPLACEMENT    . LAPAROSCOPIC APPENDECTOMY N/A 09/15/2012   Procedure: APPENDECTOMY LAPAROSCOPIC;  Surgeon: [name], MD;  Location: [facility] ORS;  Service: General;  Laterality: N/A;  . LEFT HEART CATHETERIZATION WITH CORONARY  ANGIOGRAM N/A 01/05/2014   Procedure: LEFT HEART CATHETERIZATION WITH CORONARY ANGIOGRAM;  Surgeon: [name], MD;  Location: [facility] CATH LAB;  Service: Cardiovascular;  Laterality: N/A;  . REPLACEMENT TOTAL KNEE Right 05-03-06  . TEE WITHOUT CARDIOVERSION N/A 03/17/2014   Procedure: TRANSESOPHAGEAL ECHOCARDIOGRAM (TEE);  Surgeon: [name], MD;  Location: [facility] ENDOSCOPY;  Service: Cardiovascular;  Laterality: N/A;  . TONSILLECTOMY  1972     OB History    Gravida  2   Para  2   Term  2   Preterm      AB      Living  2     SAB      TAB      Ectopic      Multiple      Live Births               Home Medications    Prior to Admission medications   Medication Sig Start Date End Date Taking? Authorizing Provider  aspirin EC 81 MG tablet Take 81 mg by mouth daily.   Yes [provider]  atorvastatin (LIPITOR) 80 MG tablet Take 1 tablet (80 mg total) by mouth every evening. 11/26/17  Yes Hilty, Nadean Corwin, MD  carvedilol (COREG) 6.25 MG tablet Take 1 tablet (6.25 mg total) by mouth 2 (two) times daily with a meal. Patient taking differently: Take 3.125 mg by mouth 2 (two) times daily.  11/26/17  Yes Hilty, Nadean Corwin, MD  colesevelam [facility]) 625 MG tablet Take 1,875 mg by mouth 2 (two) times daily with a meal. *May take 6 tablets once a day with meals*   Yes [provider]  ELIQUIS 5 MG TABS tablet Take 1 tablet (5 mg total) by mouth 2 (two) times daily. 12/06/17  Yes Hilty, Nadean Corwin, MD  furosemide (LASIX) 40 MG tablet Take 1 tablet by mouth daily.  10/25/17  Yes [provider]  glimepiride (AMARYL) 2 MG tablet Take 8 mg by mouth daily with breakfast.    Yes [provider]  insulin glargine (LANTUS) 100 UNIT/ML injection Inject 45 Units into the skin at bedtime.    Yes [provider]  insulin lispro (HUMALOG) 100 UNIT/ML injection Inject 10 Units into the skin 3 (three) times daily before meals.    Yes [provider]  lisinopril (PRINIVIL,ZESTRIL) 10 MG tablet Take 10 mg by mouth daily.   Yes [provider]  nitroGLYCERIN (NITROSTAT) 0.4 MG SL tablet Place 1 tablet (0.4 mg total) under the tongue every 5 (five) minutes as needed for chest pain (up to 3 doses). 11/26/17  Yes Hilty, Nadean Corwin, MD  nystatin cream (MYCOSTATIN) Apply 1 application topically daily as needed. 11/05/17  Yes [provider]  tiZANidine (ZANAFLEX) 4 MG tablet Take 4 mg by mouth at bedtime.  12/09/15  Yes [provider]  [place] test strip  03/03/16   [provider]  ACCU-CHEK SOFTCLIX LANCETS lancets  03/03/16   [provider]  Continuous Blood Gluc Sensor ([place]) MISC Use one sensor every 10 days. 06/20/17   Cassandria Anger, MD  lisinopril (PRINIVIL,ZESTRIL) 20 MG tablet Take 2 tablets (40 mg total)  by mouth daily. Patient not taking: Reported on 02/18/2018 12/07/17   [name], MD    Family History Family History  Problem Relation Age of Onset  . Hyperlipidemia Mother   . Hypertension Mother   . Heart attack Mother   . Cancer Father        lung  . Hypertension Sister   . Diabetes Brother   . Heart disease Brother   . Hyperlipidemia Brother   . Hypertension Brother   . Heart attack Brother   . Other Brother        DVT    Social History Social History   Tobacco Use  . Smoking status: Never Smoker  . Smokeless tobacco: Never Used  Substance Use Topics  . Alcohol use: No  . Drug use: No     Allergies   Penicillins   Review of Systems Review of Systems  Constitutional: Positive for fatigue. Negative for appetite change.  HENT: Negative for congestion, ear discharge and sinus pressure.   Eyes: Negative for discharge.  Respiratory: Negative for cough.   Cardiovascular: Negative for chest pain.  Gastrointestinal: Negative for abdominal pain and diarrhea.  Genitourinary: Negative for frequency and hematuria.    Musculoskeletal: Negative for back pain.  Skin: Negative for rash.  Neurological: Negative for seizures and headaches.  Psychiatric/Behavioral: Negative for hallucinations.     Physical Exam Updated Vital Signs BP (!) 168/76   Pulse (!) 55   Temp 98.2 F (36.8 C) (Oral)   Resp (!) 23   SpO2 98%   Physical Exam  Constitutional: She is oriented to person, place, and time. She appears well-developed.  HENT:  Head: Normocephalic.  Eyes: Conjunctivae and EOM are normal. No scleral icterus.  Neck: Neck supple. No thyromegaly present.  Cardiovascular: Normal rate and regular rhythm. Exam reveals no gallop and no friction rub.  No murmur heard. Pulmonary/Chest: No stridor. She has no wheezes. She has no rales. She exhibits no tenderness.  Abdominal: She exhibits no distension. There is no tenderness. There is no rebound.  Musculoskeletal: Normal range of motion. She exhibits no edema.  Lymphadenopathy:    She has no cervical adenopathy.  Neurological: She is oriented to person, place, and time. She exhibits normal muscle tone. Coordination normal.  Skin: No rash noted. No erythema.  Psychiatric: She has a normal mood and affect. Her behavior is normal.     ED Treatments / Results  Labs (all labs ordered are listed, but only abnormal results are displayed) Labs Reviewed  CBC WITH DIFFERENTIAL/PLATELET - Abnormal; Notable for the following components:      Result Value   RBC 5.61 (*)    Hemoglobin 15.3 (*)    MCV 77.9 (*)    Platelets 145 (*)    All other components within normal limits  BASIC METABOLIC PANEL - Abnormal; Notable for the following components:   GFR calc non Af Amer 55 (*)    All other components within normal limits  HEPATIC FUNCTION PANEL - Abnormal; Notable for the following components:   AST 130 (*)    ALT 88 (*)    Alkaline Phosphatase 147 (*)    All other components within normal limits  CBG MONITORING, ED - Abnormal; Notable for the following  components:   Glucose-Capillary 63 (*)    All other components within normal limits  CBG MONITORING, ED - Abnormal; Notable for the following components:   Glucose-Capillary 258 (*)    All other components within normal limits  CBG MONITORING, ED - Abnormal; Notable for the following components:   Glucose-Capillary 241 (*)    All other components within normal limits    EKG EKG Interpretation  Date/Time:  Monday February 18 2018 12:29:30 EDT Ventricular Rate:  73 PR Interval:    QRS Duration: 103 QT Interval:  402 QTC Calculation: 443 R Axis:   105 Text Interpretation:  Sinus rhythm Inferior infarct, age indeterminate Lateral leads are also involved Confirmed by Milton Ferguson [phone]) on 02/18/2018 1:10:16 PM   Radiology No results found.  Procedures Procedures (including critical care time)  Medications Ordered in ED Medications  lisinopril (PRINIVIL,ZESTRIL) tablet 40 mg (40 mg Oral Given 02/18/18 1307)  carvedilol (COREG) tablet 6.25 mg (6.25 mg Oral Given 02/18/18 1307)  dextrose 50 % solution 25 mL (25 mLs Intravenous Given 02/18/18 1235)     Initial Impression / Assessment and Plan / ED Course  I have reviewed the triage vital signs and the nursing notes.  Pertinent labs & imaging results that were available during my care of the patient were reviewed by me and considered in my medical decision making (see chart for details).     Patient with hypoglycemic episode.  Supposedly the patient lost a lot of weight.  And her sugars been running low.  Patient was told to stop her insulin at night and to stop her Amaryl and she will see her doctor in 2 days  Final Clinical Impressions(s) / ED Diagnoses   Final diagnoses:  Hypoglycemia    ED Discharge Orders    None       Milton Ferguson, MD 02/18/18 1731

## 2018-02-18 NOTE — Discharge Instructions (Addendum)
Stop taking your Lantus insulin.  Also stop taking your Amaryl diabetes pill.  Follow-up with Dr. Riley Kill Wednesday as planned

## 2018-02-18 NOTE — ED Notes (Signed)
vo by dr zammit to give half amp D50 and a meal. Meal given now

## 2018-02-20 DIAGNOSIS — I10 Essential (primary) hypertension: Secondary | ICD-10-CM | POA: Diagnosis not present

## 2018-02-20 DIAGNOSIS — E1165 Type 2 diabetes mellitus with hyperglycemia: Secondary | ICD-10-CM | POA: Diagnosis not present

## 2018-05-02 DIAGNOSIS — Z23 Encounter for immunization: Secondary | ICD-10-CM | POA: Diagnosis not present

## 2018-05-02 DIAGNOSIS — E1140 Type 2 diabetes mellitus with diabetic neuropathy, unspecified: Secondary | ICD-10-CM | POA: Diagnosis not present

## 2018-05-02 DIAGNOSIS — I10 Essential (primary) hypertension: Secondary | ICD-10-CM | POA: Diagnosis not present

## 2018-06-26 DIAGNOSIS — I10 Essential (primary) hypertension: Secondary | ICD-10-CM | POA: Diagnosis not present

## 2018-06-26 DIAGNOSIS — E10649 Type 1 diabetes mellitus with hypoglycemia without coma: Secondary | ICD-10-CM | POA: Diagnosis not present

## 2018-06-26 DIAGNOSIS — E11649 Type 2 diabetes mellitus with hypoglycemia without coma: Secondary | ICD-10-CM | POA: Diagnosis not present

## 2018-06-26 DIAGNOSIS — E162 Hypoglycemia, unspecified: Secondary | ICD-10-CM | POA: Diagnosis not present

## 2018-06-26 DIAGNOSIS — R200 Anesthesia of skin: Secondary | ICD-10-CM | POA: Diagnosis not present

## 2018-06-26 DIAGNOSIS — Z794 Long term (current) use of insulin: Secondary | ICD-10-CM | POA: Diagnosis not present

## 2018-06-26 DIAGNOSIS — R404 Transient alteration of awareness: Secondary | ICD-10-CM | POA: Diagnosis not present

## 2018-06-26 DIAGNOSIS — E161 Other hypoglycemia: Secondary | ICD-10-CM | POA: Diagnosis not present

## 2018-06-26 DIAGNOSIS — R42 Dizziness and giddiness: Secondary | ICD-10-CM | POA: Diagnosis not present

## 2018-09-05 DIAGNOSIS — Z0001 Encounter for general adult medical examination with abnormal findings: Secondary | ICD-10-CM | POA: Diagnosis not present

## 2018-09-05 DIAGNOSIS — Z0000 Encounter for general adult medical examination without abnormal findings: Secondary | ICD-10-CM | POA: Diagnosis not present

## 2018-09-05 DIAGNOSIS — E1140 Type 2 diabetes mellitus with diabetic neuropathy, unspecified: Secondary | ICD-10-CM | POA: Diagnosis not present

## 2018-09-05 DIAGNOSIS — Z6831 Body mass index (BMI) 31.0-31.9, adult: Secondary | ICD-10-CM | POA: Diagnosis not present

## 2018-09-05 DIAGNOSIS — Z1389 Encounter for screening for other disorder: Secondary | ICD-10-CM | POA: Diagnosis not present

## 2018-09-05 DIAGNOSIS — E1165 Type 2 diabetes mellitus with hyperglycemia: Secondary | ICD-10-CM | POA: Diagnosis not present

## 2018-09-05 DIAGNOSIS — M1991 Primary osteoarthritis, unspecified site: Secondary | ICD-10-CM | POA: Diagnosis not present

## 2018-09-05 DIAGNOSIS — R69 Illness, unspecified: Secondary | ICD-10-CM | POA: Diagnosis not present

## 2018-09-16 DIAGNOSIS — Z6831 Body mass index (BMI) 31.0-31.9, adult: Secondary | ICD-10-CM | POA: Diagnosis not present

## 2018-09-16 DIAGNOSIS — E1165 Type 2 diabetes mellitus with hyperglycemia: Secondary | ICD-10-CM | POA: Diagnosis not present

## 2018-09-16 DIAGNOSIS — M1991 Primary osteoarthritis, unspecified site: Secondary | ICD-10-CM | POA: Diagnosis not present

## 2018-09-16 DIAGNOSIS — Z1389 Encounter for screening for other disorder: Secondary | ICD-10-CM | POA: Diagnosis not present

## 2018-09-16 DIAGNOSIS — Z0001 Encounter for general adult medical examination with abnormal findings: Secondary | ICD-10-CM | POA: Diagnosis not present

## 2018-09-16 DIAGNOSIS — E1140 Type 2 diabetes mellitus with diabetic neuropathy, unspecified: Secondary | ICD-10-CM | POA: Diagnosis not present

## 2018-10-05 DIAGNOSIS — R69 Illness, unspecified: Secondary | ICD-10-CM | POA: Diagnosis not present

## 2018-11-04 DIAGNOSIS — R69 Illness, unspecified: Secondary | ICD-10-CM | POA: Diagnosis not present

## 2018-12-07 DIAGNOSIS — R69 Illness, unspecified: Secondary | ICD-10-CM | POA: Diagnosis not present

## 2018-12-17 ENCOUNTER — Other Ambulatory Visit: Payer: Self-pay | Admitting: Internal Medicine

## 2018-12-17 ENCOUNTER — Telehealth: Payer: Self-pay | Admitting: Internal Medicine

## 2018-12-17 MED ORDER — ELIQUIS 5 MG PO TABS: 5.0000 mg | ORAL_TABLET | Freq: Two times a day (BID) | ORAL | 1 refills | Status: DC

## 2018-12-17 NOTE — Telephone Encounter (Signed)
Rx has been sent to the pharmacy electronically. ° °

## 2018-12-17 NOTE — Telephone Encounter (Signed)
LVM for patient to confirm appt on 12-18-18

## 2018-12-17 NOTE — Telephone Encounter (Signed)
New message    *STAT* If patient is at the pharmacy, call can be transferred to refill team.   1. Which medications need to be refilled? (please list name of each medication and dose if known) ELIQUIS 5 MG TABS tablet  2. Which pharmacy/location (including street and city if local pharmacy) is medication to be sent to?[facility] in [place], [place]  3. Do they need a 30 day or 90 day supply?[place]

## 2018-12-18 ENCOUNTER — Ambulatory Visit: Payer: Medicare HMO | Admitting: Internal Medicine

## 2019-01-08 DIAGNOSIS — R69 Illness, unspecified: Secondary | ICD-10-CM | POA: Diagnosis not present

## 2019-01-10 ENCOUNTER — Other Ambulatory Visit: Payer: Self-pay

## 2019-01-10 ENCOUNTER — Other Ambulatory Visit: Payer: Medicare HMO

## 2019-01-10 DIAGNOSIS — Z20822 Contact with and (suspected) exposure to covid-19: Secondary | ICD-10-CM

## 2019-01-10 DIAGNOSIS — R6889 Other general symptoms and signs: Secondary | ICD-10-CM | POA: Diagnosis not present

## 2019-01-14 ENCOUNTER — Emergency Department (HOSPITAL_COMMUNITY): Payer: Medicare HMO

## 2019-01-14 ENCOUNTER — Other Ambulatory Visit: Payer: Self-pay

## 2019-01-14 ENCOUNTER — Emergency Department (HOSPITAL_COMMUNITY)
Admission: EM | Admit: 2019-01-14 | Discharge: 2019-01-14 | Disposition: A | Discharge status: Home / Self Care | Payer: Medicare HMO | Attending: Emergency Medicine | Admitting: Emergency Medicine

## 2019-01-14 ENCOUNTER — Encounter (HOSPITAL_COMMUNITY): Payer: Self-pay

## 2019-01-14 DIAGNOSIS — R252 Cramp and spasm: Secondary | ICD-10-CM | POA: Diagnosis not present

## 2019-01-14 DIAGNOSIS — I4891 Unspecified atrial fibrillation: Secondary | ICD-10-CM | POA: Diagnosis not present

## 2019-01-14 DIAGNOSIS — I2510 Atherosclerotic heart disease of native coronary artery without angina pectoris: Secondary | ICD-10-CM | POA: Insufficient documentation

## 2019-01-14 DIAGNOSIS — E1122 Type 2 diabetes mellitus with diabetic chronic kidney disease: Secondary | ICD-10-CM | POA: Diagnosis not present

## 2019-01-14 DIAGNOSIS — Z8673 Personal history of transient ischemic attack (TIA), and cerebral infarction without residual deficits: Secondary | ICD-10-CM | POA: Insufficient documentation

## 2019-01-14 DIAGNOSIS — N184 Chronic kidney disease, stage 4 (severe): Secondary | ICD-10-CM | POA: Insufficient documentation

## 2019-01-14 DIAGNOSIS — R000 Tachycardia, unspecified: Secondary | ICD-10-CM | POA: Diagnosis not present

## 2019-01-14 DIAGNOSIS — S299XXA Unspecified injury of thorax, initial encounter: Secondary | ICD-10-CM | POA: Diagnosis not present

## 2019-01-14 DIAGNOSIS — Z96651 Presence of right artificial knee joint: Secondary | ICD-10-CM | POA: Diagnosis not present

## 2019-01-14 DIAGNOSIS — Z794 Long term (current) use of insulin: Secondary | ICD-10-CM | POA: Insufficient documentation

## 2019-01-14 DIAGNOSIS — I480 Paroxysmal atrial fibrillation: Secondary | ICD-10-CM | POA: Diagnosis not present

## 2019-01-14 DIAGNOSIS — M25551 Pain in right hip: Secondary | ICD-10-CM | POA: Diagnosis not present

## 2019-01-14 DIAGNOSIS — S79911A Unspecified injury of right hip, initial encounter: Secondary | ICD-10-CM | POA: Diagnosis not present

## 2019-01-14 DIAGNOSIS — E11649 Type 2 diabetes mellitus with hypoglycemia without coma: Secondary | ICD-10-CM | POA: Diagnosis not present

## 2019-01-14 DIAGNOSIS — W19XXXA Unspecified fall, initial encounter: Secondary | ICD-10-CM | POA: Insufficient documentation

## 2019-01-14 DIAGNOSIS — I252 Old myocardial infarction: Secondary | ICD-10-CM | POA: Diagnosis not present

## 2019-01-14 DIAGNOSIS — Z7982 Long term (current) use of aspirin: Secondary | ICD-10-CM | POA: Diagnosis not present

## 2019-01-14 DIAGNOSIS — I129 Hypertensive chronic kidney disease with stage 1 through stage 4 chronic kidney disease, or unspecified chronic kidney disease: Secondary | ICD-10-CM | POA: Insufficient documentation

## 2019-01-14 DIAGNOSIS — Z7901 Long term (current) use of anticoagulants: Secondary | ICD-10-CM | POA: Diagnosis not present

## 2019-01-14 DIAGNOSIS — Z209 Contact with and (suspected) exposure to unspecified communicable disease: Secondary | ICD-10-CM | POA: Diagnosis not present

## 2019-01-14 DIAGNOSIS — R55 Syncope and collapse: Secondary | ICD-10-CM | POA: Diagnosis present

## 2019-01-14 DIAGNOSIS — Z79899 Other long term (current) drug therapy: Secondary | ICD-10-CM | POA: Insufficient documentation

## 2019-01-14 DIAGNOSIS — E161 Other hypoglycemia: Secondary | ICD-10-CM | POA: Diagnosis not present

## 2019-01-14 DIAGNOSIS — E162 Hypoglycemia, unspecified: Secondary | ICD-10-CM

## 2019-01-14 DIAGNOSIS — R52 Pain, unspecified: Secondary | ICD-10-CM | POA: Diagnosis not present

## 2019-01-14 LAB — URINALYSIS, ROUTINE W REFLEX MICROSCOPIC
Bacteria, UA: NONE SEEN
Bilirubin Urine: NEGATIVE
Glucose, UA: 150 mg/dL — AB
Ketones, ur: NEGATIVE mg/dL
Leukocytes,Ua: NEGATIVE
Nitrite: NEGATIVE
Protein, ur: 100 mg/dL — AB
Specific Gravity, Urine: 1.008 (ref 1.005–1.030)
pH: 6 (ref 5.0–8.0)

## 2019-01-14 LAB — CBG MONITORING, ED
Glucose-Capillary: 66 mg/dL — ABNORMAL LOW (ref 70–99)
Glucose-Capillary: 158 mg/dL — ABNORMAL HIGH (ref 70–99)
Glucose-Capillary: 176 mg/dL — ABNORMAL HIGH (ref 70–99)
Glucose-Capillary: 99 mg/dL (ref 70–99)
Glucose-Capillary: 109 mg/dL — ABNORMAL HIGH (ref 70–99)

## 2019-01-14 LAB — BASIC METABOLIC PANEL
Anion gap: 11 (ref 5–15)
BUN: 19 mg/dL (ref 8–23)
CO2: 24 mmol/L (ref 22–32)
Calcium: 10.4 mg/dL — ABNORMAL HIGH (ref 8.9–10.3)
Chloride: 108 mmol/L (ref 98–111)
Creatinine, Ser: 1.14 mg/dL — ABNORMAL HIGH (ref 0.44–1.00)
GFR calc Af Amer: 55 mL/min — ABNORMAL LOW (ref >60)
GFR calc non Af Amer: 47 mL/min — ABNORMAL LOW (ref >60)
Glucose, Bld: 84 mg/dL (ref 70–99)
Potassium: 4 mmol/L (ref 3.5–5.1)
Sodium: 143 mmol/L (ref 135–145)

## 2019-01-14 LAB — CBC
HCT: 46.8 % — ABNORMAL HIGH (ref 36.0–46.0)
Hemoglobin: 15.9 g/dL — ABNORMAL HIGH (ref 12.0–15.0)
MCH: 27.7 pg (ref 26.0–34.0)
MCHC: 34 g/dL (ref 30.0–36.0)
MCV: 81.7 fL (ref 80.0–100.0)
Platelets: 172 10*3/uL (ref 150–400)
RBC: 5.73 MIL/uL — ABNORMAL HIGH (ref 3.87–5.11)
RDW: 14.4 % (ref 11.5–15.5)
WBC: 5.1 10*3/uL (ref 4.0–10.5)
nRBC: 0 % (ref 0.0–0.2)

## 2019-01-14 LAB — NOVEL CORONAVIRUS, NAA: SARS-CoV-2, NAA: NOT DETECTED

## 2019-01-14 LAB — CK: Total CK: 623 U/L — ABNORMAL HIGH (ref 38–234)

## 2019-01-14 MED ORDER — DEXTROSE 50 % IV SOLN
INTRAVENOUS | Status: AC
  Filled 2019-01-14: qty 50

## 2019-01-14 MED ORDER — ACETAMINOPHEN 325 MG PO TABS
650.0000 mg | ORAL_TABLET | Freq: Once | ORAL | Status: AC
  Administered 2019-01-14: 650 mg via ORAL
  Filled 2019-01-14: qty 2

## 2019-01-14 MED ORDER — METHOCARBAMOL 500 MG PO TABS
500.0000 mg | ORAL_TABLET | Freq: Once | ORAL | Status: AC
  Administered 2019-01-14: 500 mg via ORAL
  Filled 2019-01-14: qty 1

## 2019-01-14 MED ORDER — DEXTROSE 50 % IV SOLN
25.0000 mL | Freq: Once | INTRAVENOUS | Status: AC
  Administered 2019-01-14: 25 mL via INTRAVENOUS

## 2019-01-14 MED ORDER — METOPROLOL TARTRATE 5 MG/5ML IV SOLN
5.0000 mg | Freq: Once | INTRAVENOUS | Status: AC
  Administered 2019-01-14: 5 mg via INTRAVENOUS
  Filled 2019-01-14: qty 5

## 2019-01-14 MED ORDER — SODIUM CHLORIDE 0.9 % IV BOLUS
500.0000 mL | Freq: Once | INTRAVENOUS | Status: AC
  Administered 2019-01-14: 500 mL via INTRAVENOUS

## 2019-01-14 NOTE — ED Notes (Signed)
Pt c/o leg cramps and being hungry. Pt given med per order and frozen dinner, jello, apple sauce, and tea given.

## 2019-01-14 NOTE — ED Notes (Signed)
Pt stated that she is going to call her daughter to come this way to be able to be here to get her.

## 2019-01-14 NOTE — ED Notes (Signed)
Pt read over d/c papers. Did not sign/ NT to wheel out

## 2019-01-14 NOTE — ED Notes (Signed)
Ambulated patient with walker. Did not require any assistance

## 2019-01-14 NOTE — ED Triage Notes (Signed)
Pt brought in by EMS. Pt fell while getting out of bed this morning. Pt laid in floor all day until she was able to crawl to phone and call family. Pt BS 52 and was given 30 grams of oral glucose. Repeat BS 47. Pt complaining of right sided pain. Denies LOC

## 2019-01-14 NOTE — ED Notes (Signed)
Pt stated that family is already out there waiting on her

## 2019-01-14 NOTE — Discharge Instructions (Signed)
Please follow up with your doctor Return if you are worsening

## 2019-01-14 NOTE — ED Provider Notes (Addendum)
[facility] EMERGENCY DEPARTMENT Provider Note   CSN: [id number] Arrival date & time: 01/14/19  1418     History   Chief Complaint Chief Complaint  Patient presents with  . Fall    HPI Cassandra Adams is a 73 y.o. female who presents with a fall, hypoglycemia.  Past medical history significant for coronary artery disease, paroxysmal A. fib on Eliquis, chronic kidney disease, hypertension, insulin-dependent diabetes.  Patient states that for the past 2 to 3 days her glucose has been running low.  She states it has been running in the 50s at times.  She did not take her insulin yesterday due to this.  She states she does not remember what happened this morning she remembers going to bed last night and then woke up on the floor.  She is unsure of the time and is unsure of how long she was on the floor.  Eventually she was able to call her grandson who helped her up and called EMS.  EMS noted that her glucose was 52 and she was given oral glucose with no improvement.  She denies any recent fever, chills, chest pain, shortness of breath, abdominal pain.  No nausea or vomiting.  She has some pain on her right shoulder and her right hip where she believes she fell onto the carpeted floor.  She denies any head injury.  She has not ambulated since the fall.     HPI  Past Medical History:  Diagnosis Date  . Arthritis    "all over"  . CAD (coronary artery disease)    a. NSTEMI 12/2013 - occluded dominant RCA with L-R collaterals, moderate prox segmental LAD disease, for medical therapy initially, EF 60% with subtle inferobasal hypokinesia. Consider PCI for refractory CP.  [name] CKD (chronic kidney disease), stage III ([place])   . Hypertension   . Migraines    "weekly" (01/06/2014)  . Sickle cell trait ([place])   . TIA (transient ischemic attack) 1978  . Type II diabetes mellitus ([place])   . Vertigo     Patient Active Problem List   Diagnosis Date Noted  . DM type 2 causing vascular disease ([place])  06/11/2017  . Type 2 diabetes mellitus with stage 4 chronic kidney disease, with long-term current use of insulin ([place]) 06/11/2017  . Carotid stenosis 02/05/2017  . TIA (transient ischemic attack) 01/20/2017  . Carotid stenosis, right 01/20/2017  . Diabetic hyperosmolar non-ketotic state ([place]) 01/19/2017  . AKI (acute kidney injury) ([place]) 01/19/2017  . Left leg weakness 01/19/2017  . Dyslipidemia 11/24/2016  . Edema 03/30/2016  . Diaphoresis 02/18/2016  . Hypoglycemia 02/18/2016  . Bradycardia 04/22/2015  . Coronary artery disease due to lipid rich plaque 02/26/2014  . PAF (paroxysmal atrial fibrillation) ([place]) 02/26/2014  . NSTEMI (non-ST elevated myocardial infarction) ([place]) 01/03/2014  . Occlusion and stenosis of carotid artery without mention of cerebral infarction 11/21/2013  . Pain in limb-Left neck 11/21/2013  . Carotid stenosis, bilateral 11/17/2011  . HYPERTHYROIDISM, SUBCLINICAL 02/15/2010  . GERD 02/15/2010  . DYSPHAGIA UNSPECIFIED 02/15/2010  . ABDOMINAL PAIN, UNSPECIFIED SITE 02/15/2010  . Controlled insulin-dependent diabetes mellitus with neuropathy ([place]) 02/11/2010  . Essential hypertension 02/11/2010  . HEADACHE 02/11/2010    Past Surgical History:  Procedure Laterality Date  . APPENDECTOMY  March 2014   had appendix frozen  . CARDIAC CATHETERIZATION  "years ago" & 01/05/2014  . CARDIOVERSION N/A 03/17/2014   Procedure: CARDIOVERSION;  Surgeon: [name], MD;  Location: [facility];  Service:  Cardiovascular;  Laterality: N/A;  . CATARACT EXTRACTION W/ INTRAOCULAR LENS  IMPLANT, BILATERAL Bilateral 04/2013-05/2013  . ENDARTERECTOMY Right 02/05/2017   Procedure: ENDARTERECTOMY CAROTID-RIGHT;  Surgeon: [name], MD;  Location[facility] OR;  Service: Vascular;  Laterality: Right;  . ESOPHAGOGASTRODUODENOSCOPY  11/08/2007    Normal esophagus without evidence of Barrett, mass, erosion/ Normal stomach, duodenal bulb  . GASTRIC MOTILITY STUDY  11/13/2007    mildly delayed emptying subjectively, but normal  analysis-77% of tracer emptied at 2 hours  . JOINT REPLACEMENT    . LAPAROSCOPIC APPENDECTOMY N/A 09/15/2012   Procedure: APPENDECTOMY LAPAROSCOPIC;  Surgeon: [name], MD;  Location: [facility];  Service: General;  Laterality: N/A;  . LEFT HEART CATHETERIZATION WITH CORONARY ANGIOGRAM N/A 01/05/2014   Procedure: LEFT HEART CATHETERIZATION WITH CORONARY ANGIOGRAM;  Surgeon: [name], MD;  Location: [facility] CATH LAB;  Service: Cardiovascular;  Laterality: N/A;  . REPLACEMENT TOTAL KNEE Right 05-03-06  . TEE WITHOUT CARDIOVERSION N/A 03/17/2014   Procedure: TRANSESOPHAGEAL ECHOCARDIOGRAM (TEE);  Surgeon: [name], MD;  Location: [facility] ENDOSCOPY;  Service: Cardiovascular;  Laterality: N/A;  . TONSILLECTOMY  1972     OB History    Gravida  2   Para  2   Term  2   Preterm      AB      Living  2     SAB      TAB      Ectopic      Multiple      Live Births               Home Medications    Prior to Admission medications   Medication Sig Start Date End Date Taking? Authorizing Provider  ACCU-CHEK AVIVA PLUS test strip  03/03/16   [provider]  ACCU-CHEK SOFTCLIX LANCETS lancets  03/03/16   [provider]  aspirin EC 81 MG tablet Take 81 mg by mouth daily.    [provider]  atorvastatin (LIPITOR) 80 MG tablet Take 1 tablet (80 mg total) by mouth every evening. 11/26/17   Hilty, Nadean Corwin, MD  carvedilol (COREG) 6.25 MG tablet Take 1 tablet (6.25 mg total) by mouth 2 (two) times daily with a meal. Patient taking differently: Take 3.125 mg by mouth 2 (two) times daily.  11/26/17   Hilty, Nadean Corwin, MD  colesevelam (WELCHOL) 625 MG tablet Take 1,875 mg by mouth 2 (two) times daily with a meal. *May take 6 tablets once a day with meals*    [provider]  Continuous Blood Gluc Sensor (Festus) MISC Use one sensor every 10 days. 06/20/17   Cassandria Anger, MD   ELIQUIS 5 MG TABS tablet Take 1 tablet (5 mg total) by mouth 2 (two) times daily. 12/17/18   Hilty, Nadean Corwin, MD  furosemide (LASIX) 40 MG tablet Take 1 tablet by mouth daily.  10/25/17   [provider]  glimepiride (AMARYL) 2 MG tablet Take 8 mg by mouth daily with breakfast.     [provider]  insulin glargine (LANTUS) 100 UNIT/ML injection Inject 45 Units into the skin at bedtime.     [provider]  insulin lispro (HUMALOG) 100 UNIT/ML injection Inject 10 Units into the skin 3 (three) times daily before meals.     [provider]  lisinopril (PRINIVIL,ZESTRIL) 10 MG tablet Take 10 mg by mouth daily.    [provider]  lisinopril (PRINIVIL,ZESTRIL) 20 MG tablet  Take 2 tablets (40 mg total) by mouth daily. Patient not taking: Reported on 02/18/2018 12/07/17   [name], MD  nitroGLYCERIN (NITROSTAT) 0.4 MG SL tablet Place 1 tablet (0.4 mg total) under the tongue every 5 (five) minutes as needed for chest pain (up to 3 doses). 11/26/17   Hilty, Nadean Corwin, MD  nystatin cream (MYCOSTATIN) Apply 1 application topically daily as needed. 11/05/17   [provider]  tiZANidine (ZANAFLEX) 4 MG tablet Take 4 mg by mouth at bedtime.  12/09/15   [provider]    Family History Family History  Problem Relation Age of Onset  . Hyperlipidemia Mother   . Hypertension Mother   . Heart attack Mother   . Cancer Father        lung  . Hypertension Sister   . Diabetes Brother   . Heart disease Brother   . Hyperlipidemia Brother   . Hypertension Brother   . Heart attack Brother   . Other Brother        DVT    Social History Social History   Tobacco Use  . Smoking status: Never Smoker  . Smokeless tobacco: Never Used  Substance Use Topics  . Alcohol use: No  . Drug use: No     Allergies   Penicillins   Review of Systems Review of Systems  Constitutional: Negative for fever.  Respiratory: Negative for shortness of  breath.   Cardiovascular: Negative for chest pain.  Gastrointestinal: Negative for abdominal pain, diarrhea, nausea and vomiting.  Endocrine:       +low glucose  Genitourinary: Negative for difficulty urinating and dyspareunia.  Musculoskeletal: Positive for arthralgias.  Allergic/Immunologic: Positive for immunocompromised state (diabetic).  Neurological: Negative for syncope.  All other systems reviewed and are negative.    Physical Exam Updated Vital Signs BP (!) 174/131 (BP Location: Left Arm)   Pulse (!) 134   Temp 97.6 F (36.4 C) (Oral)   Resp 16   SpO2 97%   Physical Exam Vitals signs and nursing note reviewed.  Constitutional:      General: She is not in acute distress.    Appearance: She is well-developed and well-groomed. She is obese. She is not ill-appearing.  HENT:     Head: Normocephalic and atraumatic.  Eyes:     General: No scleral icterus.       Right eye: No discharge.        Left eye: No discharge.     Conjunctiva/sclera: Conjunctivae normal.     Pupils: Pupils are equal, round, and reactive to light.  Neck:     Musculoskeletal: Normal range of motion.  Cardiovascular:     Rate and Rhythm: Tachycardia present.     Comments: A fib with RVR Pulmonary:     Effort: Pulmonary effort is normal. No respiratory distress.     Breath sounds: Normal breath sounds.  Abdominal:     General: There is no distension.     Palpations: Abdomen is soft.     Tenderness: There is no abdominal tenderness.  Musculoskeletal:     Comments: Right shoulder: No obvious swelling, deformity, or warmth. No tenderness. FROM. 5/5 grip strength. N/V intact.  Right hip: No obvious swelling, deformity, or warmth. Tenderness to palpation. Decreased ROM.  N/V intact.    Skin:    General: Skin is dry.     Comments: Skin feels cool  Neurological:     Mental Status: She is alert and oriented to person, place,  and time.  Psychiatric:        Behavior: Behavior normal. Behavior is  cooperative.      ED Treatments / Results  Labs (all labs ordered are listed, but only abnormal results are displayed) Labs Reviewed  BASIC METABOLIC PANEL - Abnormal; Notable for the following components:      Result Value   Creatinine, Ser 1.14 (*)    Calcium 10.4 (*)    GFR calc non Af Amer 47 (*)    GFR calc Af Amer 55 (*)    All other components within normal limits  CBC - Abnormal; Notable for the following components:   RBC 5.73 (*)    Hemoglobin 15.9 (*)    HCT 46.8 (*)    All other components within normal limits  CK - Abnormal; Notable for the following components:   Total CK 623 (*)    All other components within normal limits  URINALYSIS, ROUTINE W REFLEX MICROSCOPIC - Abnormal; Notable for the following components:   Color, Urine STRAW (*)    Glucose, UA 150 (*)    Hgb urine dipstick MODERATE (*)    Protein, ur 100 (*)    All other components within normal limits  CBG MONITORING, ED - Abnormal; Notable for the following components:   Glucose-Capillary 66 (*)    All other components within normal limits  CBG MONITORING, ED - Abnormal; Notable for the following components:   Glucose-Capillary 158 (*)    All other components within normal limits  CBG MONITORING, ED - Abnormal; Notable for the following components:   Glucose-Capillary 176 (*)    All other components within normal limits  CBG MONITORING, ED - Abnormal; Notable for the following components:   Glucose-Capillary 109 (*)    All other components within normal limits  CBG MONITORING, ED    EKG EKG Interpretation  Date/Time:  Tuesday January 14 2019 14:48:11 EDT Ventricular Rate:  130 PR Interval:    QRS Duration: 102 QT Interval:  330 QTC Calculation: 486 R Axis:   89 Text Interpretation:  Atrial fibrillation LVH with secondary repolarization abnormality Inferior infarct, age indeterminate afib is new Confirmed by Davonna Belling [id number]) on 01/14/2019 2:51:49 PM   Radiology Ct Hip Right Wo  Contrast  Result Date: 01/14/2019 CLINICAL DATA:  Right hip pain since a recent fall. Abnormal radiographs. EXAM: CT OF THE RIGHT HIP WITHOUT CONTRAST TECHNIQUE: Multidetector CT imaging of the right hip was performed according to the standard protocol. Multiplanar CT image reconstructions were also generated. COMPARISON:  Radiographs dated 01/14/2019 FINDINGS: Bones/Joint/Cartilage There is no fracture or dislocation. There is moderate arthritis of right hip joint with posterior and superomedial joint space narrowing with cystic degenerative changes in the posterior aspect of the femoral head and acetabulum. Muscles and Tendons Negative. Soft tissues Focal soft tissue density in the subcutaneous fat lateral to the proximal right femoral shaft consistent with soft tissue contusion. Incidental note is made of calcified uterine fibroids. IMPRESSION: 1. No acute osseous abnormality of the right hip. Moderate arthritis of the right hip joint. 2. Soft tissue contusion lateral to the proximal right femoral shaft. Electronically Signed   By: Lorriane Shire M.D.   On: 01/14/2019 18:40   Dg Chest Portable 1 View  Result Date: 01/14/2019 CLINICAL DATA:  Tachycardia. The patient fell today. EXAM: PORTABLE CHEST 1 VIEW COMPARISON:  01/19/2017 FINDINGS: Heart size and pulmonary vascularity are normal. Lungs are clear. Chronic elevation of the right hemidiaphragm. No bone abnormality. Aortic atherosclerosis.  IMPRESSION: 1. No acute cardiopulmonary disease. 2. Aortic atherosclerosis. 3. No acute rib fracture or bone destruction. Electronically Signed   By: Lorriane Shire M.D.   On: 01/14/2019 18:41   Dg Hip Unilat W Or Wo Pelvis 2-3 Views Right  Result Date: 01/14/2019 CLINICAL DATA:  Recent fall with right hip pain, initial encounter EXAM: DG HIP (WITH OR WITHOUT PELVIS) 3V RIGHT COMPARISON:  None. FINDINGS: Degenerative changes of the hip joints are noted bilaterally right greater than left. Degenerative changes in the  lumbar spine are seen. The right hip shows mild irregularity near the greater trochanter suspicious for nondisplaced greater trochanter fracture. There does not appear to be extension through the intratrochanteric region. The pelvic ring is intact. IMPRESSION: Changes suspicious for an undisplaced greater trochanter fracture. CT may be helpful for further evaluation. Electronically Signed   By: Inez Catalina M.D.   On: 01/14/2019 17:40    Procedures Procedures (including critical care time)  Medications Ordered in ED Medications  dextrose 50 % solution 25 mL (25 mLs Intravenous Given 01/14/19 1451)  metoprolol tartrate (LOPRESSOR) injection 5 mg (5 mg Intravenous Given 01/14/19 1828)  acetaminophen (TYLENOL) tablet 650 mg (650 mg Oral Given 01/14/19 1821)  sodium chloride 0.9 % bolus 500 mL (0 mLs Intravenous Stopped 01/14/19 1958)  methocarbamol (ROBAXIN) tablet 500 mg (500 mg Oral Given 01/14/19 1957)     Initial Impression / Assessment and Plan / ED Course  I have reviewed the triage vital signs and the nursing notes.  Pertinent labs & imaging results that were available during my care of the patient were reviewed by me and considered in my medical decision making (see chart for details).  73 year old female presents with fall out of bed and hypoglycemia. Her glucose has been running low recently per her report. Glucose was in the 50s per EMS. Vitals are remarkable for significant hypertension and she is in A.fib with RVR. She has a hx of A.fib. She is also tender over the R hip. Will obtain EKG, labs, hip xray. Will give dose of metoprolol for A.fib with RVR which may be due to her missing her morning Coreg.  EKG is shows A.fib. CBC is remarkable for elevated hgb (15.9). BMP is remarkable for SCr of 1.1 which is around her baseline. CK is 623. She is now complaining of cramping in her thigh. This seems to be a chronic issue and states she states she usually eats mustard for this. Will give  tylenol and some fluids. Xray is remarkable for questionable hip fracture. Will obtain CT of hip. CXR and UA are normal. Shared visit with Dr. Lacinda Axon.  CT is negative for fracture. She was able to ambulate per nursing. She ate and drank here. She is still having leg cramps - Robaxin ordered. HR has normalized. She was advised to f/u with her doctor regarding her insulin dosage.   Final Clinical Impressions(s) / ED Diagnoses   Final diagnoses:  Fall, initial encounter  Hypoglycemia  Leg cramps  Paroxysmal atrial fibrillation [facility])    ED Discharge Orders    None       Recardo Evangelist, PA-C 01/14/19 2104    Recardo Evangelist, PA-C 01/14/19 2104    Nat Christen, MD 01/15/19 2025

## 2019-02-11 DIAGNOSIS — R69 Illness, unspecified: Secondary | ICD-10-CM | POA: Diagnosis not present

## 2019-02-26 ENCOUNTER — Emergency Department (HOSPITAL_COMMUNITY): Payer: Medicare HMO

## 2019-02-26 ENCOUNTER — Encounter (HOSPITAL_COMMUNITY): Payer: Self-pay

## 2019-02-26 ENCOUNTER — Observation Stay (HOSPITAL_COMMUNITY)
Admission: EM | Admit: 2019-02-26 | Discharge: 2019-02-27 | Disposition: A | Discharge status: Home / Self Care | Payer: Medicare HMO | Attending: Internal Medicine | Admitting: Internal Medicine

## 2019-02-26 ENCOUNTER — Other Ambulatory Visit: Payer: Self-pay

## 2019-02-26 DIAGNOSIS — Z833 Family history of diabetes mellitus: Secondary | ICD-10-CM | POA: Diagnosis not present

## 2019-02-26 DIAGNOSIS — R4701 Aphasia: Secondary | ICD-10-CM | POA: Diagnosis not present

## 2019-02-26 DIAGNOSIS — Z96651 Presence of right artificial knee joint: Secondary | ICD-10-CM | POA: Diagnosis not present

## 2019-02-26 DIAGNOSIS — I252 Old myocardial infarction: Secondary | ICD-10-CM | POA: Insufficient documentation

## 2019-02-26 DIAGNOSIS — Z79899 Other long term (current) drug therapy: Secondary | ICD-10-CM | POA: Diagnosis not present

## 2019-02-26 DIAGNOSIS — E1122 Type 2 diabetes mellitus with diabetic chronic kidney disease: Secondary | ICD-10-CM | POA: Diagnosis not present

## 2019-02-26 DIAGNOSIS — E1140 Type 2 diabetes mellitus with diabetic neuropathy, unspecified: Secondary | ICD-10-CM | POA: Diagnosis not present

## 2019-02-26 DIAGNOSIS — Z880 Allergy status to penicillin: Secondary | ICD-10-CM | POA: Diagnosis not present

## 2019-02-26 DIAGNOSIS — Z20828 Contact with and (suspected) exposure to other viral communicable diseases: Secondary | ICD-10-CM | POA: Insufficient documentation

## 2019-02-26 DIAGNOSIS — E1159 Type 2 diabetes mellitus with other circulatory complications: Secondary | ICD-10-CM | POA: Insufficient documentation

## 2019-02-26 DIAGNOSIS — N184 Chronic kidney disease, stage 4 (severe): Secondary | ICD-10-CM | POA: Diagnosis not present

## 2019-02-26 DIAGNOSIS — R491 Aphonia: Principal | ICD-10-CM | POA: Insufficient documentation

## 2019-02-26 DIAGNOSIS — I160 Hypertensive urgency: Secondary | ICD-10-CM | POA: Diagnosis not present

## 2019-02-26 DIAGNOSIS — Z7901 Long term (current) use of anticoagulants: Secondary | ICD-10-CM | POA: Diagnosis not present

## 2019-02-26 DIAGNOSIS — Z7982 Long term (current) use of aspirin: Secondary | ICD-10-CM | POA: Insufficient documentation

## 2019-02-26 DIAGNOSIS — M1990 Unspecified osteoarthritis, unspecified site: Secondary | ICD-10-CM | POA: Diagnosis not present

## 2019-02-26 DIAGNOSIS — Z8673 Personal history of transient ischemic attack (TIA), and cerebral infarction without residual deficits: Secondary | ICD-10-CM | POA: Diagnosis not present

## 2019-02-26 DIAGNOSIS — R41841 Cognitive communication deficit: Secondary | ICD-10-CM | POA: Diagnosis not present

## 2019-02-26 DIAGNOSIS — R51 Headache: Secondary | ICD-10-CM | POA: Insufficient documentation

## 2019-02-26 DIAGNOSIS — I2510 Atherosclerotic heart disease of native coronary artery without angina pectoris: Secondary | ICD-10-CM | POA: Insufficient documentation

## 2019-02-26 DIAGNOSIS — I10 Essential (primary) hypertension: Secondary | ICD-10-CM | POA: Diagnosis present

## 2019-02-26 DIAGNOSIS — I129 Hypertensive chronic kidney disease with stage 1 through stage 4 chronic kidney disease, or unspecified chronic kidney disease: Secondary | ICD-10-CM | POA: Diagnosis not present

## 2019-02-26 DIAGNOSIS — Z794 Long term (current) use of insulin: Secondary | ICD-10-CM | POA: Insufficient documentation

## 2019-02-26 DIAGNOSIS — E1169 Type 2 diabetes mellitus with other specified complication: Secondary | ICD-10-CM | POA: Diagnosis present

## 2019-02-26 DIAGNOSIS — Z8249 Family history of ischemic heart disease and other diseases of the circulatory system: Secondary | ICD-10-CM | POA: Insufficient documentation

## 2019-02-26 DIAGNOSIS — E6609 Other obesity due to excess calories: Secondary | ICD-10-CM | POA: Diagnosis not present

## 2019-02-26 DIAGNOSIS — Z6830 Body mass index (BMI) 30.0-30.9, adult: Secondary | ICD-10-CM | POA: Diagnosis not present

## 2019-02-26 DIAGNOSIS — I480 Paroxysmal atrial fibrillation: Secondary | ICD-10-CM | POA: Diagnosis not present

## 2019-02-26 DIAGNOSIS — D573 Sickle-cell trait: Secondary | ICD-10-CM | POA: Insufficient documentation

## 2019-02-26 LAB — COMPREHENSIVE METABOLIC PANEL
ALT: 31 U/L (ref 0–44)
AST: 42 U/L — ABNORMAL HIGH (ref 15–41)
Albumin: 3.6 g/dL (ref 3.5–5.0)
Alkaline Phosphatase: 100 U/L (ref 38–126)
Anion gap: 8 (ref 5–15)
BUN: 23 mg/dL (ref 8–23)
CO2: 25 mmol/L (ref 22–32)
Calcium: 9.8 mg/dL (ref 8.9–10.3)
Chloride: 108 mmol/L (ref 98–111)
Creatinine, Ser: 1.39 mg/dL — ABNORMAL HIGH (ref 0.44–1.00)
GFR calc Af Amer: 43 mL/min — ABNORMAL LOW (ref >60)
GFR calc non Af Amer: 37 mL/min — ABNORMAL LOW (ref >60)
Glucose, Bld: 96 mg/dL (ref 70–99)
Potassium: 4.6 mmol/L (ref 3.5–5.1)
Sodium: 141 mmol/L (ref 135–145)
Total Bilirubin: 0.4 mg/dL (ref 0.3–1.2)
Total Protein: 7.4 g/dL (ref 6.5–8.1)

## 2019-02-26 LAB — DIFFERENTIAL
Abs Immature Granulocytes: 0.01 10*3/uL (ref 0.00–0.07)
Basophils Absolute: 0 10*3/uL (ref 0.0–0.1)
Basophils Relative: 1 %
Eosinophils Absolute: 0.2 10*3/uL (ref 0.0–0.5)
Eosinophils Relative: 4 %
Immature Granulocytes: 0 %
Lymphocytes Relative: 33 %
Lymphs Abs: 1.8 10*3/uL (ref 0.7–4.0)
Monocytes Absolute: 0.5 10*3/uL (ref 0.1–1.0)
Monocytes Relative: 10 %
Neutro Abs: 2.8 10*3/uL (ref 1.7–7.7)
Neutrophils Relative %: 52 %

## 2019-02-26 LAB — CBC
HCT: 37.1 % (ref 36.0–46.0)
Hemoglobin: 12.8 g/dL (ref 12.0–15.0)
MCH: 28.7 pg (ref 26.0–34.0)
MCHC: 34.5 g/dL (ref 30.0–36.0)
MCV: 83.2 fL (ref 80.0–100.0)
Platelets: 157 10*3/uL (ref 150–400)
RBC: 4.46 MIL/uL (ref 3.87–5.11)
RDW: 13.6 % (ref 11.5–15.5)
WBC: 5.4 10*3/uL (ref 4.0–10.5)
nRBC: 0 % (ref 0.0–0.2)

## 2019-02-26 LAB — PROTIME-INR
INR: 1.2 (ref 0.8–1.2)
Prothrombin Time: 15 seconds (ref 11.4–15.2)

## 2019-02-26 LAB — I-STAT CHEM 8, ED
BUN: 22 mg/dL (ref 8–23)
Calcium, Ion: 1.42 mmol/L — ABNORMAL HIGH (ref 1.15–1.40)
Chloride: 107 mmol/L (ref 98–111)
Creatinine, Ser: 1.4 mg/dL — ABNORMAL HIGH (ref 0.44–1.00)
Glucose, Bld: 87 mg/dL (ref 70–99)
HCT: 38 % (ref 36.0–46.0)
Hemoglobin: 12.9 g/dL (ref 12.0–15.0)
Potassium: 4.8 mmol/L (ref 3.5–5.1)
Sodium: 143 mmol/L (ref 135–145)
TCO2: 27 mmol/L (ref 22–32)

## 2019-02-26 LAB — APTT: aPTT: 37 seconds — ABNORMAL HIGH (ref 24–36)

## 2019-02-26 MED ORDER — SODIUM CHLORIDE 0.9% FLUSH
3.0000 mL | Freq: Once | INTRAVENOUS | Status: AC
Start: 2019-02-26 — End: 2019-02-27
  Administered 2019-02-27: 06:00:00 3 mL via INTRAVENOUS

## 2019-02-26 MED ORDER — ONDANSETRON HCL 4 MG PO TABS: 4.0000 mg | ORAL_TABLET | Freq: Four times a day (QID) | ORAL | Status: DC | PRN

## 2019-02-26 MED ORDER — CARVEDILOL 3.125 MG PO TABS
3.1250 mg | ORAL_TABLET | Freq: Two times a day (BID) | ORAL | Status: DC
  Administered 2019-02-27 (×2): 3.125 mg via ORAL
  Filled 2019-02-26 (×3): qty 1

## 2019-02-26 MED ORDER — ATORVASTATIN CALCIUM 40 MG PO TABS
80.0000 mg | ORAL_TABLET | Freq: Every evening | ORAL | Status: DC
  Administered 2019-02-27: 80 mg via ORAL
  Filled 2019-02-26: qty 2
  Filled 2019-02-26: qty 1

## 2019-02-26 MED ORDER — APIXABAN 5 MG PO TABS
5.0000 mg | ORAL_TABLET | Freq: Two times a day (BID) | ORAL | Status: DC
  Administered 2019-02-27 (×2): 5 mg via ORAL
  Filled 2019-02-26 (×3): qty 1

## 2019-02-26 MED ORDER — FUROSEMIDE 40 MG PO TABS
40.0000 mg | ORAL_TABLET | Freq: Two times a day (BID) | ORAL | Status: DC
  Administered 2019-02-27 (×2): 40 mg via ORAL
  Filled 2019-02-26 (×2): qty 1

## 2019-02-26 MED ORDER — LISINOPRIL 20 MG PO TABS
20.0000 mg | ORAL_TABLET | Freq: Every day | ORAL | Status: DC
  Administered 2019-02-27: 20 mg via ORAL
  Filled 2019-02-26: qty 1

## 2019-02-26 MED ORDER — NITROGLYCERIN 0.4 MG SL SUBL: 0.4000 mg | SUBLINGUAL_TABLET | SUBLINGUAL | Status: DC | PRN

## 2019-02-26 MED ORDER — ACETAMINOPHEN 650 MG RE SUPP: 650.0000 mg | Freq: Four times a day (QID) | RECTAL | Status: DC | PRN

## 2019-02-26 MED ORDER — ASPIRIN EC 81 MG PO TBEC
81.0000 mg | DELAYED_RELEASE_TABLET | Freq: Every day | ORAL | Status: DC
  Administered 2019-02-27: 11:00:00 81 mg via ORAL
  Filled 2019-02-26: qty 1

## 2019-02-26 MED ORDER — INSULIN ASPART 100 UNIT/ML ~~LOC~~ SOLN
0.0000 [IU] | Freq: Three times a day (TID) | SUBCUTANEOUS | Status: DC
  Administered 2019-02-27: 3 [IU] via SUBCUTANEOUS
  Administered 2019-02-27: 12:00:00 2 [IU] via SUBCUTANEOUS
  Filled 2019-02-26: qty 0.09

## 2019-02-26 MED ORDER — ONDANSETRON HCL 4 MG/2ML IJ SOLN: 4.0000 mg | Freq: Four times a day (QID) | INTRAMUSCULAR | Status: DC | PRN

## 2019-02-26 MED ORDER — TIZANIDINE HCL 4 MG PO TABS
4.0000 mg | ORAL_TABLET | Freq: Every day | ORAL | Status: DC
  Administered 2019-02-27: 02:00:00 4 mg via ORAL
  Filled 2019-02-26: qty 1

## 2019-02-26 MED ORDER — COLESEVELAM HCL 625 MG PO TABS
1875.0000 mg | ORAL_TABLET | Freq: Two times a day (BID) | ORAL | Status: DC
  Administered 2019-02-27 (×2): 1875 mg via ORAL
  Filled 2019-02-26 (×3): qty 3

## 2019-02-26 MED ORDER — ACETAMINOPHEN 325 MG PO TABS
650.0000 mg | ORAL_TABLET | Freq: Four times a day (QID) | ORAL | Status: DC | PRN
  Administered 2019-02-27 (×2): 650 mg via ORAL
  Filled 2019-02-26 (×2): qty 2

## 2019-02-26 MED ORDER — INSULIN GLARGINE 100 UNIT/ML ~~LOC~~ SOLN
35.0000 [IU] | Freq: Every day | SUBCUTANEOUS | Status: DC
  Administered 2019-02-27: 35 [IU] via SUBCUTANEOUS
  Filled 2019-02-26 (×2): qty 0.35

## 2019-02-26 NOTE — H&P (Addendum)
History and Physical    Cassandra Adams BZJ:[id number] DOB: 09-Mar-1946 DOA: 02/26/2019  PCP: Redmond School, MD  Patient coming from: Home.  Chief Complaint: Difficulty speaking.  HPI: Cassandra Adams is a 73 y.o. female with history of paroxysmal atrial fibrillation, diastolic dysfunction, CAD, hypertension, chronic kidney disease stage II presents to the ER because of difficulty speaking over the last 2 weeks.  Patient also noticed some left lower extremity weakness.  Patient states he usually uses a cane with help with walking due to arthritis.  But over the last 2 weeks has noticed increased weakness and difficulty talking.  Denies any difficulty swallowing or any headache or weakness of the other extremities.  ED Course: In the ER CT head was unremarkable EKG shows sinus bradycardia.  Lab work was largely unremarkable except for creatinine 1.3.  COVID-19 test was pending.  Patient at this time admitted to make sure there was no stroke.  Patient blood pressure is more than 017 systolic.  Review of Systems: As per HPI, rest all negative.   Past Medical History:  Diagnosis Date   Arthritis    "all over"   CAD (coronary artery disease)    a. NSTEMI 12/2013 - occluded dominant RCA with L-R collaterals, moderate prox segmental LAD disease, for medical therapy initially, EF 60% with subtle inferobasal hypokinesia. Consider PCI for refractory CP.   CKD (chronic kidney disease), stage III ([place])    Hypertension    Migraines    "weekly" (01/06/2014)   Sickle cell trait ([place])    TIA (transient ischemic attack) 1978   Type II diabetes mellitus ([place])    Vertigo     Past Surgical History:  Procedure Laterality Date   APPENDECTOMY  March 2014   had appendix frozen   CARDIAC CATHETERIZATION  "years ago" & 01/05/2014   CARDIOVERSION N/A 03/17/2014   Procedure: CARDIOVERSION;  Surgeon: [name], MD;  Location: [facility];  Service: Cardiovascular;  Laterality:  N/A;   CATARACT EXTRACTION W/ INTRAOCULAR LENS  IMPLANT, BILATERAL Bilateral 04/2013-05/2013   ENDARTERECTOMY Right 02/05/2017   Procedure: ENDARTERECTOMY CAROTID-RIGHT;  Surgeon: [name], MD;  Location: [facility];  Service: Vascular;  Laterality: Right;   ESOPHAGOGASTRODUODENOSCOPY  11/08/2007    Normal esophagus without evidence of Barrett, mass, erosion/ Normal stomach, duodenal bulb   GASTRIC MOTILITY STUDY  11/13/2007   mildly delayed emptying subjectively, but normal  analysis-77% of tracer emptied at 2 hours   [place] N/A 09/15/2012   Procedure: APPENDECTOMY LAPAROSCOPIC;  Surgeon: [name], MD;  Location: [facility];  Service: General;  Laterality: N/A;   LEFT HEART CATHETERIZATION WITH CORONARY ANGIOGRAM N/A 01/05/2014   Procedure: LEFT HEART CATHETERIZATION WITH CORONARY ANGIOGRAM;  Surgeon: [name], MD;  Location: [facility] CATH LAB;  Service: Cardiovascular;  Laterality: N/A;   REPLACEMENT TOTAL KNEE Right 05-03-06   TEE WITHOUT CARDIOVERSION N/A 03/17/2014   Procedure: TRANSESOPHAGEAL ECHOCARDIOGRAM (TEE);  Surgeon: [name], MD;  Location: [facility] ENDOSCOPY;  Service: Cardiovascular;  Laterality: N/A;   TONSILLECTOMY  1972     reports that she has never smoked. She has never used smokeless tobacco. She reports that she does not drink alcohol or use drugs.  Allergies  Allergen Reactions   Penicillins Hives    Has patient had a PCN reaction causing immediate rash, facial/tongue/throat swelling, SOB or lightheadedness with hypotension: no Has patient had a PCN reaction causing severe rash involving mucus membranes or  skin necrosis: No  Has patient had a PCN reaction that required hospitalization: no Has patient had a PCN reaction occurring within the last 10 years: no If all of the above answers are "NO", then may proceed with Cephalosporin use.     Family History  Problem Relation Age of Onset   Hyperlipidemia  Mother    Hypertension Mother    Heart attack Mother    Cancer Father        lung   Hypertension Sister    Diabetes Brother    Heart disease Brother    Hyperlipidemia Brother    Hypertension Brother    Heart attack Brother    Other Brother        DVT    Prior to Admission medications   Medication Sig Start Date End Date Taking? Authorizing Provider  aspirin EC 81 MG tablet Take 81 mg by mouth daily.   Yes [provider]  atorvastatin (LIPITOR) 80 MG tablet Take 1 tablet (80 mg total) by mouth every evening. 11/26/17  Yes Hilty, Nadean Corwin, MD  carvedilol (COREG) 3.125 MG tablet Take 3.125 mg by mouth 2 (two) times daily. 12/23/18  Yes [provider]  colesevelam (WELCHOL) 625 MG tablet Take 1,875 mg by mouth 2 (two) times daily with a meal. *May take 6 tablets once a day with meals*   Yes [provider]  ELIQUIS 5 MG TABS tablet Take 1 tablet (5 mg total) by mouth 2 (two) times daily. 12/17/18  Yes Hilty, Nadean Corwin, MD  furosemide (LASIX) 40 MG tablet Take 1 tablet by mouth 2 (two) times daily.  10/25/17  Yes [provider]  Insulin Glargine (BASAGLAR KWIKPEN) 100 UNIT/ML SOPN Inject 45 Units into the skin at bedtime.   Yes [provider]  insulin lispro (HUMALOG) 100 UNIT/ML injection Inject 3-10 Units into the skin 3 (three) times daily before meals. Per sliding scale   Yes [provider]  lisinopril (ZESTRIL) 20 MG tablet Take 20 mg by mouth daily.   Yes [provider]  nitroGLYCERIN (NITROSTAT) 0.4 MG SL tablet Place 1 tablet (0.4 mg total) under the tongue every 5 (five) minutes as needed for chest pain (up to 3 doses). 11/26/17  Yes Hilty, Nadean Corwin, MD  nystatin cream (MYCOSTATIN) Apply 1 application topically 2 (two) times a day.  11/05/17  Yes [provider]  tiZANidine (ZANAFLEX) 4 MG tablet Take 4 mg by mouth at bedtime.  12/09/15  Yes [provider]    Physical Exam: Constitutional:  Moderately built and nourished. Vitals:   02/26/19 2155 02/26/19 2216 02/26/19 2230 02/26/19 2300  BP: (!) 203/78 (!) 207/71 (!) 207/69 (!) 201/75  Pulse: (!) 55 63 (!) 111 (!) 57  Resp: 16 18 19 17   Temp:      TempSrc:      SpO2: 100% 100% 100% 100%   Eyes: Anicteric no pallor. ENMT: No discharge from the ears eyes nose or mouth. Neck: No mass felt.  No neck rigidity. Respiratory: No rhonchi or crepitations. Cardiovascular: S1-S2 heard. Abdomen: Soft nontender bowel sounds present. Musculoskeletal: No edema.  No joint effusion. Skin: No rash. Neurologic: Alert awake oriented to time place and person.  Left lower extremity areas 3 x 5.  Rest of the extremities are 5 x 5 though right lower extremities mildly weak.  No facial asymmetry pupils are equal and reacting to light. Psychiatric: Appears normal.   Labs on Admission: I have personally reviewed following labs  and imaging studies  CBC: Recent Labs  Lab 02/26/19 1906 02/26/19 1919  WBC 5.4  --   NEUTROABS 2.8  --   HGB 12.8 12.9  HCT 37.1 38.0  MCV 83.2  --   PLT 157  --    Basic Metabolic Panel: Recent Labs  Lab 02/26/19 1906 02/26/19 1919  NA 141 143  K 4.6 4.8  CL 108 107  CO2 25  --   GLUCOSE 96 87  BUN 23 22  CREATININE 1.39* 1.40*  CALCIUM 9.8  --    GFR: CrCl cannot be calculated (Unknown ideal weight.). Liver Function Tests: Recent Labs  Lab 02/26/19 1906  AST 42*  ALT 31  ALKPHOS 100  BILITOT 0.4  PROT 7.4  ALBUMIN 3.6   No results for input(s): LIPASE, AMYLASE in the last 168 hours. No results for input(s): AMMONIA in the last 168 hours. Coagulation Profile: Recent Labs  Lab 02/26/19 1906  INR 1.2   Cardiac Enzymes: No results for input(s): CKTOTAL, CKMB, CKMBINDEX, TROPONINI in the last 168 hours. BNP (last 3 results) No results for input(s): PROBNP in the last 8760 hours. HbA1C: No results for input(s): HGBA1C in the last 72 hours. CBG: No results for input(s): GLUCAP in the  last 168 hours. Lipid Profile: No results for input(s): CHOL, HDL, LDLCALC, TRIG, CHOLHDL, LDLDIRECT in the last 72 hours. Thyroid Function Tests: No results for input(s): TSH, T4TOTAL, FREET4, T3FREE, THYROIDAB in the last 72 hours. Anemia Panel: No results for input(s): VITAMINB12, FOLATE, FERRITIN, TIBC, IRON, RETICCTPCT in the last 72 hours. Urine analysis:    Component Value Date/Time   COLORURINE STRAW (A) 01/14/2019 1931   APPEARANCEUR CLEAR 01/14/2019 1931   LABSPEC 1.008 01/14/2019 1931   PHURINE 6.0 01/14/2019 1931   GLUCOSEU 150 (A) 01/14/2019 1931   HGBUR MODERATE (A) 01/14/2019 1931   BILIRUBINUR NEGATIVE 01/14/2019 1931   KETONESUR NEGATIVE 01/14/2019 1931   PROTEINUR 100 (A) 01/14/2019 1931   UROBILINOGEN 0.2 09/15/2012 1010   NITRITE NEGATIVE 01/14/2019 1931   LEUKOCYTESUR NEGATIVE 01/14/2019 1931   Sepsis Labs: @LABRCNTIP (procalcitonin:4,lacticidven:4) )No results found for this or any previous visit (from the past 240 hour(s)).   Radiological Exams on Admission: Ct Head Wo Contrast  Result Date: 02/26/2019 CLINICAL DATA:  Frontal headache. Unable to speak for 2 weeks. EXAM: CT HEAD WITHOUT CONTRAST TECHNIQUE: Contiguous axial images were obtained from the base of the skull through the vertex without intravenous contrast. COMPARISON:  February 05, 2017 FINDINGS: Brain: No evidence of acute infarction, hemorrhage, hydrocephalus, extra-axial collection or mass lesion/mass effect. Vascular: Calcific atherosclerotic disease. Skull: Normal. Negative for fracture or focal lesion. Sinuses/Orbits: No acute finding. Other: None. IMPRESSION: No acute intracranial abnormality. Electronically Signed   By: Fidela Salisbury M.D.   On: 02/26/2019 21:07    EKG: Independently reviewed.  Sinus bradycardia.  Heart rate is around 59 bpm.  Assessment/Plan Principal Problem:   Aphasia Active Problems:   Essential hypertension   PAF (paroxysmal atrial fibrillation) (HCC)   DM type  2 causing vascular disease (HCC)    1. Aphasia with left lower extremity weakness with history of A. fib on apixaban -discussed with on-call neurology Dr. Karena Addison Aroor.  Who at this time advised to get MRI brain.  If MRI brain is positive for stroke and further stroke work-up.  Patient passed swallow. 2. Hypertensive urgency -since patient's symptoms are more than 2 weeks.  We will try to control blood pressure.  In addition to patient's Coreg and  lisinopril I have placed patient on PRN IV hydralazine.  Blood pressure trends. 3. Proximal atrial fibrillation is presently in sinus rhythm on apixaban.  On Coreg for rate limit. 4. Diabetes mellitus type 2 on long-acting insulin.  Sliding scale coverage. 5. History of diastolic dysfunction on Lasix.  Other creatinine is mildly elevated.  May have to hold lisinopril and Lasix if creatinine worsens. 6. Chronic kidney disease stage II creatinine appears to be at baseline.  See #5 with regard to creatinine. 7. History of arthritis. 8. History of CAD on Coreg aspirin apixaban statins.   DVT prophylaxis: Apixaban. Code Status: Full code. Family Communication: Discussed with patient. Disposition Plan: Home. Consults called: Physical therapy.  Discussed with neurologist. Admission status: Observation.   Rise Patience MD [facility] Pager [phone].  If 7PM-7AM, please contact night-coverage [email or web address] Password  [facility]  02/26/2019, 11:49 PM

## 2019-02-26 NOTE — ED Triage Notes (Signed)
Pt presents with c/o left side pain down her entire body and pt is also unable to speak. Pt is alert and oriented, able to answer all questions, but has to mouth the answers as she cannot make any noise. Pt reports this has been this way for one month. Pt went to her PCP and was advised to come to the ER.

## 2019-02-26 NOTE — ED Provider Notes (Signed)
[facility] DEPT Provider Note   CSN: [id number] Arrival date & time: 02/26/19  1756     History   Chief Complaint Chief Complaint  Patient presents with  . Unable to speak  . Left side pain    HPI Cassandra Adams is a 73 y.o. female.     HPI Patient presents with difficulty speaking.  Reportedly has been unable to speak for the last 2 weeks.  She can mouth the words but nothing comes out.  States it is like her throat is not working.  Had an episode like this a few months ago that resolved on its own.  This was been going for around 2 weeks.  Reported went to her PCP and was told to come here.  Also complaining pain down her left side.  States she has had some dull headaches.  Dull on the front of her head.  History of atrial fibrillation is on Eliquis.  Does have a history of TIA in the past also.  No lateralizing numbness or weakness but has pain on the left side. Past Medical History:  Diagnosis Date  . Arthritis    "all over"  . CAD (coronary artery disease)    a. NSTEMI 12/2013 - occluded dominant RCA with L-R collaterals, moderate prox segmental LAD disease, for medical therapy initially, EF 60% with subtle inferobasal hypokinesia. Consider PCI for refractory CP.  [name] CKD (chronic kidney disease), stage III ([place])   . Hypertension   . Migraines    "weekly" (01/06/2014)  . Sickle cell trait ([place])   . TIA (transient ischemic attack) 1978  . Type II diabetes mellitus ([place])   . Vertigo     Patient Active Problem List   Diagnosis Date Noted  . DM type 2 causing vascular disease ([place]) 06/11/2017  . Type 2 diabetes mellitus with stage 4 chronic kidney disease, with long-term current use of insulin ([place]) 06/11/2017  . Carotid stenosis 02/05/2017  . TIA (transient ischemic attack) 01/20/2017  . Carotid stenosis, right 01/20/2017  . Diabetic hyperosmolar non-ketotic state ([place]) 01/19/2017  . AKI (acute kidney injury) ([place]) 01/19/2017  . Left  leg weakness 01/19/2017  . Dyslipidemia 11/24/2016  . Edema 03/30/2016  . Diaphoresis 02/18/2016  . Hypoglycemia 02/18/2016  . Bradycardia 04/22/2015  . Coronary artery disease due to lipid rich plaque 02/26/2014  . PAF (paroxysmal atrial fibrillation) ([place]) 02/26/2014  . NSTEMI (non-ST elevated myocardial infarction) ([place]) 01/03/2014  . Occlusion and stenosis of carotid artery without mention of cerebral infarction 11/21/2013  . Pain in limb-Left neck 11/21/2013  . Carotid stenosis, bilateral 11/17/2011  . HYPERTHYROIDISM, SUBCLINICAL 02/15/2010  . GERD 02/15/2010  . DYSPHAGIA UNSPECIFIED 02/15/2010  . ABDOMINAL PAIN, UNSPECIFIED SITE 02/15/2010  . Controlled insulin-dependent diabetes mellitus with neuropathy ([place]) 02/11/2010  . Essential hypertension 02/11/2010  . HEADACHE 02/11/2010    Past Surgical History:  Procedure Laterality Date  . APPENDECTOMY  March 2014   had appendix frozen  . CARDIAC CATHETERIZATION  "years ago" & 01/05/2014  . CARDIOVERSION N/A 03/17/2014   Procedure: CARDIOVERSION;  Surgeon: [name], MD;  Location: [facility] ENDOSCOPY;  Service: Cardiovascular;  Laterality: N/A;  . CATARACT EXTRACTION W/ INTRAOCULAR LENS  IMPLANT, BILATERAL Bilateral 04/2013-05/2013  . ENDARTERECTOMY Right 02/05/2017   Procedure: ENDARTERECTOMY CAROTID-RIGHT;  Surgeon: [name], MD;  Location: [facility] OR;  Service: Vascular;  Laterality: Right;  . ESOPHAGOGASTRODUODENOSCOPY  11/08/2007    Normal esophagus without evidence of Barrett, mass, erosion/ Normal stomach, duodenal  bulb  . GASTRIC MOTILITY STUDY  11/13/2007   mildly delayed emptying subjectively, but normal  analysis-77% of tracer emptied at 2 hours  . JOINT REPLACEMENT    . LAPAROSCOPIC APPENDECTOMY N/A 09/15/2012   Procedure: APPENDECTOMY LAPAROSCOPIC;  Surgeon: [name], MD;  Location: [facility];  Service: General;  Laterality: N/A;  . LEFT HEART CATHETERIZATION WITH CORONARY ANGIOGRAM N/A 01/05/2014   Procedure:  LEFT HEART CATHETERIZATION WITH CORONARY ANGIOGRAM;  Surgeon: [name], MD;  Location: [facility] CATH LAB;  Service: Cardiovascular;  Laterality: N/A;  . REPLACEMENT TOTAL KNEE Right 05-03-06  . TEE WITHOUT CARDIOVERSION N/A 03/17/2014   Procedure: TRANSESOPHAGEAL ECHOCARDIOGRAM (TEE);  Surgeon: [name], MD;  Location: [facility] ENDOSCOPY;  Service: Cardiovascular;  Laterality: N/A;  . TONSILLECTOMY  1972     OB History    Gravida  2   Para  2   Term  2   Preterm      AB      Living  2     SAB      TAB      Ectopic      Multiple      Live Births               Home Medications    Prior to Admission medications   Medication Sig Start Date End Date Taking? Authorizing Provider  aspirin EC 81 MG tablet Take 81 mg by mouth daily.    [provider]  atorvastatin (LIPITOR) 80 MG tablet Take 1 tablet (80 mg total) by mouth every evening. 11/26/17   Hilty, Nadean Corwin, MD  carvedilol (COREG) 3.125 MG tablet Take 3.125 mg by mouth 2 (two) times daily. 12/23/18   [provider]  colesevelam (WELCHOL) 625 MG tablet Take 1,875 mg by mouth 2 (two) times daily with a meal. *May take 6 tablets once a day with meals*    [provider]  ELIQUIS 5 MG TABS tablet Take 1 tablet (5 mg total) by mouth 2 (two) times daily. 12/17/18   Hilty, Nadean Corwin, MD  furosemide (LASIX) 40 MG tablet Take 1 tablet by mouth daily.  10/25/17   [provider]  glimepiride (AMARYL) 2 MG tablet Take 8 mg by mouth daily with breakfast.     [provider]  Insulin Glargine (BASAGLAR KWIKPEN) 100 UNIT/ML SOPN Inject 45 Units into the skin at bedtime.    [provider]  insulin lispro (HUMALOG) 100 UNIT/ML injection Inject 3-10 Units into the skin 3 (three) times daily before meals. Per sliding scale    [provider]  lisinopril (PRINIVIL,ZESTRIL) 10 MG tablet Take 10 mg by mouth daily.    [provider]  nitroGLYCERIN (NITROSTAT) 0.4 MG  SL tablet Place 1 tablet (0.4 mg total) under the tongue every 5 (five) minutes as needed for chest pain (up to 3 doses). 11/26/17   Hilty, Nadean Corwin, MD  nystatin cream (MYCOSTATIN) Apply 1 application topically 2 (two) times a day.  11/05/17   [provider]  tiZANidine (ZANAFLEX) 4 MG tablet Take 4 mg by mouth at bedtime.  12/09/15   [provider]    Family History Family History  Problem Relation Age of Onset  . Hyperlipidemia Mother   . Hypertension Mother   . Heart attack Mother   . Cancer Father        lung  . Hypertension Sister   . Diabetes Brother   . Heart disease Brother   .  Hyperlipidemia Brother   . Hypertension Brother   . Heart attack Brother   . Other Brother        DVT    Social History Social History   Tobacco Use  . Smoking status: Never Smoker  . Smokeless tobacco: Never Used  Substance Use Topics  . Alcohol use: No  . Drug use: No     Allergies   Penicillins   Review of Systems Review of Systems  Constitutional: Negative for appetite change.  HENT: Positive for voice change. Negative for facial swelling and trouble swallowing.   Respiratory: Negative for shortness of breath.   Cardiovascular: Negative for chest pain.  Gastrointestinal: Negative for abdominal pain.  Genitourinary: Negative for flank pain.  Musculoskeletal: Positive for back pain.  Skin: Negative for wound.  Neurological: Positive for speech difficulty and headaches.     Physical Exam Updated Vital Signs BP (!) 168/74 (BP Location: Right Arm)   Pulse 88   Temp 97.8 F (36.6 C) (Oral)   Resp 16   Physical Exam Vitals signs reviewed.  HENT:     Head: Atraumatic.  Eyes:     Extraocular Movements: Extraocular movements intact.     Pupils: Pupils are equal, round, and reactive to light.  Neck:     Musculoskeletal: Neck supple.  Cardiovascular:     Rate and Rhythm: Regular rhythm.  Pulmonary:     Effort: Pulmonary effort is normal.  Abdominal:      Tenderness: There is no abdominal tenderness.  Neurological:     Mental Status: She is alert.     Comments: Moving all extremities.  Face symmetric.  Eye movements intact.  Patient able to mouth words but not actually speak.  Patient can whisper either.      ED Treatments / Results  Labs (all labs ordered are listed, but only abnormal results are displayed) Labs Reviewed  APTT - Abnormal; Notable for the following components:      Result Value   aPTT 37 (*)    All other components within normal limits  COMPREHENSIVE METABOLIC PANEL - Abnormal; Notable for the following components:   Creatinine, Ser 1.39 (*)    AST 42 (*)    GFR calc non Af Amer 37 (*)    GFR calc Af Amer 43 (*)    All other components within normal limits  I-STAT CHEM 8, ED - Abnormal; Notable for the following components:   Creatinine, Ser 1.40 (*)    Calcium, Ion 1.42 (*)    All other components within normal limits  PROTIME-INR  CBC  DIFFERENTIAL  CBG MONITORING, ED    EKG EKG Interpretation  Date/Time:  Wednesday February 26 2019 19:13:12 EDT Ventricular Rate:  59 PR Interval:    QRS Duration: 99 QT Interval:  392 QTC Calculation: 389 R Axis:   -10 Text Interpretation:  Sinus rhythm Borderline repolarization abnormality Borderline ST elevation, anterior leads Baseline wander in lead(s) V2 Confirmed by Davonna Belling [phone]) on 02/26/2019 9:37:31 PM   Radiology Ct Head Wo Contrast  Result Date: 02/26/2019 CLINICAL DATA:  Frontal headache. Unable to speak for 2 weeks. EXAM: CT HEAD WITHOUT CONTRAST TECHNIQUE: Contiguous axial images were obtained from the base of the skull through the vertex without intravenous contrast. COMPARISON:  February 05, 2017 FINDINGS: Brain: No evidence of acute infarction, hemorrhage, hydrocephalus, extra-axial collection or mass lesion/mass effect. Vascular: Calcific atherosclerotic disease. Skull: Normal. Negative for fracture or focal lesion. Sinuses/Orbits: No acute  finding. Other: None. IMPRESSION:  No acute intracranial abnormality. Electronically Signed   By: Fidela Salisbury M.D.   On: 02/26/2019 21:07    Procedures Procedures (including critical care time)  Medications Ordered in ED Medications  sodium chloride flush (NS) 0.9 % injection 3 mL (has no administration in time range)     Initial Impression / Assessment and Plan / ED Course  I have reviewed the triage vital signs and the nursing notes.  Pertinent labs & imaging results that were available during my care of the patient were reviewed by me and considered in my medical decision making (see chart for details).        Patient with inability to speak.  Potentially could be strokelike but also could be vocal cord issue.  With risk factors of atrial fibrillation on anticoagulation I feel the patient benefit from mission to the hospital.  Will discuss with hospitalist Final Clinical Impressions(s) / ED Diagnoses   Final diagnoses:  Aphasia    ED Discharge Orders    None       Davonna Belling, MD 02/26/19 2154

## 2019-02-26 NOTE — ED Notes (Signed)
Pt ambulatory with cane Steady gait and w/o staff assistance

## 2019-02-27 ENCOUNTER — Observation Stay (HOSPITAL_COMMUNITY): Payer: Medicare HMO

## 2019-02-27 DIAGNOSIS — R29818 Other symptoms and signs involving the nervous system: Secondary | ICD-10-CM | POA: Diagnosis not present

## 2019-02-27 DIAGNOSIS — R491 Aphonia: Secondary | ICD-10-CM

## 2019-02-27 DIAGNOSIS — I10 Essential (primary) hypertension: Secondary | ICD-10-CM

## 2019-02-27 DIAGNOSIS — I480 Paroxysmal atrial fibrillation: Secondary | ICD-10-CM | POA: Diagnosis not present

## 2019-02-27 DIAGNOSIS — E1159 Type 2 diabetes mellitus with other circulatory complications: Secondary | ICD-10-CM | POA: Diagnosis not present

## 2019-02-27 LAB — BASIC METABOLIC PANEL
Anion gap: 8 (ref 5–15)
BUN: 22 mg/dL (ref 8–23)
CO2: 25 mmol/L (ref 22–32)
Calcium: 9.4 mg/dL (ref 8.9–10.3)
Chloride: 108 mmol/L (ref 98–111)
Creatinine, Ser: 1.19 mg/dL — ABNORMAL HIGH (ref 0.44–1.00)
GFR calc Af Amer: 52 mL/min — ABNORMAL LOW (ref >60)
GFR calc non Af Amer: 45 mL/min — ABNORMAL LOW (ref >60)
Glucose, Bld: 138 mg/dL — ABNORMAL HIGH (ref 70–99)
Potassium: 3.9 mmol/L (ref 3.5–5.1)
Sodium: 141 mmol/L (ref 135–145)

## 2019-02-27 LAB — SARS CORONAVIRUS 2 (TAT 6-24 HRS): SARS Coronavirus 2: NEGATIVE

## 2019-02-27 LAB — HEMOGLOBIN A1C
Hgb A1c MFr Bld: 9 % — ABNORMAL HIGH (ref 4.8–5.6)
Mean Plasma Glucose: 211.6 mg/dL

## 2019-02-27 LAB — CBC
HCT: 31.5 % — ABNORMAL LOW (ref 36.0–46.0)
Hemoglobin: 11 g/dL — ABNORMAL LOW (ref 12.0–15.0)
MCH: 28.8 pg (ref 26.0–34.0)
MCHC: 34.9 g/dL (ref 30.0–36.0)
MCV: 82.5 fL (ref 80.0–100.0)
Platelets: 133 10*3/uL — ABNORMAL LOW (ref 150–400)
RBC: 3.82 MIL/uL — ABNORMAL LOW (ref 3.87–5.11)
RDW: 13.6 % (ref 11.5–15.5)
WBC: 4.8 10*3/uL (ref 4.0–10.5)
nRBC: 0 % (ref 0.0–0.2)

## 2019-02-27 LAB — GLUCOSE, CAPILLARY
Glucose-Capillary: 172 mg/dL — ABNORMAL HIGH (ref 70–99)
Glucose-Capillary: 55 mg/dL — ABNORMAL LOW (ref 70–99)
Glucose-Capillary: 76 mg/dL (ref 70–99)
Glucose-Capillary: 196 mg/dL — ABNORMAL HIGH (ref 70–99)
Glucose-Capillary: 218 mg/dL — ABNORMAL HIGH (ref 70–99)

## 2019-02-27 MED ORDER — HYDRALAZINE HCL 25 MG PO TABS: 25.0000 mg | ORAL_TABLET | Freq: Three times a day (TID) | ORAL | 11 refills | Status: DC

## 2019-02-27 MED ORDER — HYDRALAZINE HCL 20 MG/ML IJ SOLN
10.0000 mg | INTRAMUSCULAR | Status: DC | PRN
  Administered 2019-02-27: 10 mg via INTRAVENOUS
  Filled 2019-02-27: qty 1

## 2019-02-27 NOTE — ED Notes (Signed)
Levada Dy, sister, [phone]

## 2019-02-27 NOTE — ED Notes (Signed)
Patient transported to MRI 

## 2019-02-27 NOTE — ED Notes (Signed)
Pt ambulated to BR with cane. Pt then moved to hospital bed for comfort due to holding in ED. Pt has call bell within reach and given warm blankets.

## 2019-02-27 NOTE — ED Notes (Signed)
Pt provided with meal tray.

## 2019-02-27 NOTE — Discharge Summary (Signed)
Physician Discharge Summary  Cassandra Adams RFF:[id number] DOB: [birth year] DOA: 02/26/2019  PCP: Redmond School, MD  Admit date: 02/26/2019 Discharge date: 02/27/2019  Admitted From: Home  Discharge disposition: Home   Recommendations for Outpatient Follow-Up:   Follow up with your primary care provider in one week.  Discuss about getting a ENT referral as outpatient   Discharge Diagnosis:   Principal Problem:  Loss of voice Active Problems:   Essential hypertension   PAF (paroxysmal atrial fibrillation) (HCC)   DM type 2 causing vascular disease ([place])    Discharge Condition: stable  Diet recommendation:  Carbohydrate-modified.    Wound care: None.  Code status: Full.   History of Present Illness:   Cassandra Adams is a 73 y.o. female with history of paroxysmal atrial fibrillation, diastolic dysfunction, CAD, hypertension, chronic kidney disease stage II presents to the ER because of difficulty speaking over the last 2 weeks.  Patient also noticed some left lower extremity weakness.  Patient states he usually uses a cane with help with walking due to arthritis.  But over the last 2 weeks has noticed increased weakness and difficulty talking.  Denies any difficulty swallowing or any headache or weakness of the other extremities. ED Course: In the ER CT head was unremarkable EKG shows sinus bradycardia.  Lab work was largely unremarkable except for creatinine 1.3.  Patient blood pressure was more than 357 systolic.   Hospital Course:   Lack of phonation.  Going on for the last 1 month.  Stroke ruled out. MRI of the brain was negative.  Patient has distinct speech but is able to whisper only.  It is likely that patient does have local vocal cord issues causing phonation problems.  She does not have the other focal weakness at the time of my exam.  She is already on optimal treatment for stroke but her blood pressure needs to be adequately controlled.  I have added  hydralazine p.o. for adequate blood pressure control on discharge.  She will have to follow-up with her primary care physician after discharge and I have advised the patient as well as patient's grandson to discuss about getting a ENT referral if her voice does not return.   Hypertensive urgency -blood pressure needs to be better control.  We will add hydralazine to her home medication regimen.  Patient takes her Coreg and lisinopril at baseline.  Will have to follow-up with her primary care physician for optimal blood pressure control.  Paroxysmal atrial fibrillation is presently in sinus rhythm on apixaban.  On Coreg for rate limit.  We will continue on discharge  Diabetes mellitus type 2 on long-acting insulin.    Continue on discharge.  History of diastolic dysfunction on Lasix.   Advised to follow-up with your primary care physician as outpatient.    Chronic kidney disease stage II creatinine appears to be at baseline.  See #5 with regard to creatinine.  History of CAD on Coreg aspirin apixaban statins.  No acute issues at this time.  Disposition.  At this time, patient has ruled out for acute stroke.  Patient is optimally on medications for prevention of stroke but will add hydralazine in the context of CKD and hypertensive urgency.  This will need to be followed up closely as outpatient with her primary care physician.  Have spoken with the patient's grandson about this.   Medical Consultants:    None.   Subjective:   Today, patient feels okay.  Denies focal weakness.  She can  whisper.  She has lost her voice for the last 1 month.  Discharge Exam:   Vitals:   02/27/19 1430 02/27/19 1507  BP: (!) 157/48 (!) 170/62  Pulse: (!) 48 (!) 53  Resp: 15 19  Temp:  98.8 F (37.1 C)  SpO2: 98% 99%   Vitals:   02/27/19 1330 02/27/19 1400 02/27/19 1430 02/27/19 1507  BP: (!) 149/53 (!) 153/59 (!) 157/48 (!) 170/62  Pulse: 64 (!) 51 (!) 48 (!) 53  Resp: (!) 21 (!) 24 15 19    Temp:    98.8 F (37.1 C)  TempSrc:    Oral  SpO2: 100% 99% 98% 99%    General exam: Appears calm and comfortable ,Not in distress, obese.  Can whisper and comprehend conversation.  Able to understand her speech clearly.  Loss of voice though HEENT:PERRL,Oral mucosa moist Respiratory system: Bilateral equal air entry, normal vesicular breath sounds, no wheezes or crackles  Cardiovascular system: S1 & S2 heard, RRR.  Gastrointestinal system: Abdomen is nondistended, soft and nontender. No organomegaly or masses felt. Normal bowel sounds heard. Central nervous system: Alert and oriented. No focal neurological deficits. Extremities: No edema, no clubbing ,no cyanosis, distal peripheral pulses palpable. Skin: No rashes, lesions or ulcers,no icterus ,no pallor MSK: Normal muscle bulk,tone ,power    Procedures:    None  The results of significant diagnostics from this hospitalization (including imaging, microbiology, ancillary and laboratory) are listed below for reference.     Diagnostic Studies:   Ct Head Wo Contrast  Result Date: 02/26/2019 CLINICAL DATA:  Frontal headache. Unable to speak for 2 weeks. EXAM: CT HEAD WITHOUT CONTRAST TECHNIQUE: Contiguous axial images were obtained from the base of the skull through the vertex without intravenous contrast. COMPARISON:  February 05, 2017 FINDINGS: Brain: No evidence of acute infarction, hemorrhage, hydrocephalus, extra-axial collection or mass lesion/mass effect. Vascular: Calcific atherosclerotic disease. Skull: Normal. Negative for fracture or focal lesion. Sinuses/Orbits: No acute finding. Other: None. IMPRESSION: No acute intracranial abnormality. Electronically Signed   By: Fidela Salisbury M.D.   On: 02/26/2019 21:07   Mr Brain Wo Contrast  Result Date: 02/27/2019 CLINICAL DATA:  Focal neuro deficit greater than 6 hours. Difficulty with speech 2 weeks EXAM: MRI HEAD WITHOUT CONTRAST TECHNIQUE: Multiplanar, multiecho pulse sequences  of the brain and surrounding structures were obtained without intravenous contrast. COMPARISON:  MRI head 01/19/2017.  CT head 02/26/2019 FINDINGS: Brain: Ventricle size and cerebral volume normal for age Negative for acute infarct. Mild chronic white matter changes. Negative for hemorrhage or mass or midline shift. Vascular: Normal arterial flow voids Skull and upper cervical spine: Negative Sinuses/Orbits: Mucosal edema paranasal sinuses. Bilateral cataract surgery. Other: None IMPRESSION: Negative for acute infarct. Very mild chronic microvascular ischemic changes in the white matter, less than expected for age. Electronically Signed   By: Franchot Gallo M.D.   On: 02/27/2019 10:26     Labs:   Basic Metabolic Panel: Recent Labs  Lab 02/26/19 1906 02/26/19 1919 02/27/19 0500  NA 141 143 141  K 4.6 4.8 3.9  CL 108 107 108  CO2 25  --  25  GLUCOSE 96 87 138*  BUN 23 22 22   CREATININE 1.39* 1.40* 1.19*  CALCIUM 9.8  --  9.4   GFR CrCl cannot be calculated (Unknown ideal weight.). Liver Function Tests: Recent Labs  Lab 02/26/19 1906  AST 42*  ALT 31  ALKPHOS 100  BILITOT 0.4  PROT 7.4  ALBUMIN 3.6  No results for input(s): LIPASE, AMYLASE in the last 168 hours. No results for input(s): AMMONIA in the last 168 hours. Coagulation profile Recent Labs  Lab 02/26/19 1906  INR 1.2    CBC: Recent Labs  Lab 02/26/19 1906 02/26/19 1919 02/27/19 0500  WBC 5.4  --  4.8  NEUTROABS 2.8  --   --   HGB 12.8 12.9 11.0*  HCT 37.1 38.0 31.5*  MCV 83.2  --  82.5  PLT 157  --  133*   Cardiac Enzymes: No results for input(s): CKTOTAL, CKMB, CKMBINDEX, TROPONINI in the last 168 hours. BNP: Invalid input(s): POCBNP CBG: Recent Labs  Lab 02/27/19 0134 02/27/19 0800 02/27/19 0847 02/27/19 1140  GLUCAP 172* 55* 76 196*   D-Dimer No results for input(s): DDIMER in the last 72 hours. Hgb A1c Recent Labs    02/26/19 1906  HGBA1C 9.0*   Lipid Profile No results for  input(s): CHOL, HDL, LDLCALC, TRIG, CHOLHDL, LDLDIRECT in the last 72 hours. Thyroid function studies No results for input(s): TSH, T4TOTAL, T3FREE, THYROIDAB in the last 72 hours.  Invalid input(s): FREET3 Anemia work up No results for input(s): VITAMINB12, FOLATE, FERRITIN, TIBC, IRON, RETICCTPCT in the last 72 hours. Microbiology Recent Results (from the past 240 hour(s))  SARS CORONAVIRUS 2 (TAT 6-24 HRS) Nasopharyngeal Nasopharyngeal Swab     Status: None   Collection Time: 02/26/19 10:01 PM   Specimen: Nasopharyngeal Swab  Result Value Ref Range Status   SARS Coronavirus 2 NEGATIVE NEGATIVE Final    Comment: (NOTE) SARS-CoV-2 target nucleic acids are NOT DETECTED. The SARS-CoV-2 RNA is generally detectable in upper and lower respiratory specimens during the acute phase of infection. Negative results do not preclude SARS-CoV-2 infection, do not rule out co-infections with other pathogens, and should not be used as the sole basis for treatment or other patient management decisions. Negative results must be combined with clinical observations, patient history, and epidemiological information. The expected result is Negative. Fact Sheet for Patients: SugarRoll.be Fact Sheet for Healthcare Providers: https://www.woods-mathews.com/ This test is not yet approved or cleared by the [place] FDA and  has been authorized for detection and/or diagnosis of SARS-CoV-2 by FDA under an Emergency Use Authorization (EUA). This EUA will remain  in effect (meaning this test can be used) for the duration of the COVID-19 declaration under Section 56 4(b)(1) of the Act, 21 U.S.C. section 360bbb-3(b)(1), unless the authorization is terminated or revoked sooner. Performed at [facility], [place] [address], [place], [place] [address]      Discharge Instructions:   Discharge Instructions    Diet - low sodium heart healthy   Complete by: As  directed    Discharge instructions   Complete by: As directed    Please re   Please follow up with your primary care physician within one week. Please discuss about getting referral for voice loss with ENT specialist   Increase activity slowly   Complete by: As directed      Allergies as of 02/27/2019      Reactions   Penicillins Hives   Has patient had a PCN reaction causing immediate rash, facial/tongue/throat swelling, SOB or lightheadedness with hypotension: no Has patient had a PCN reaction causing severe rash involving mucus membranes or skin necrosis: No  Has patient had a PCN reaction that required hospitalization: no Has patient had a PCN reaction occurring within the last 10 years: no If all of the above answers are "NO", then may proceed  with Cephalosporin use.      Medication List    TAKE these medications   aspirin EC 81 MG tablet Take 81 mg by mouth daily.   atorvastatin 80 MG tablet Commonly known as: LIPITOR Take 1 tablet (80 mg total) by mouth every evening.   Basaglar KwikPen 100 UNIT/ML Sopn Inject 45 Units into the skin at bedtime.   carvedilol 3.125 MG tablet Commonly known as: COREG Take 3.125 mg by mouth 2 (two) times daily.   colesevelam 625 MG tablet Commonly known as: WELCHOL Take 1,875 mg by mouth 2 (two) times daily with a meal. *May take 6 tablets once a day with meals*   Eliquis 5 MG Tabs tablet Generic drug: apixaban Take 1 tablet (5 mg total) by mouth 2 (two) times daily.   furosemide 40 MG tablet Commonly known as: LASIX Take 1 tablet by mouth 2 (two) times daily.   hydrALAZINE 25 MG tablet Commonly known as: APRESOLINE Take 1 tablet (25 mg total) by mouth 3 (three) times daily.   insulin lispro 100 UNIT/ML injection Commonly known as: HUMALOG Inject 3-10 Units into the skin 3 (three) times daily before meals. Per sliding scale   lisinopril 20 MG tablet Commonly known as: ZESTRIL Take 20 mg by mouth daily.   nitroGLYCERIN 0.4  MG SL tablet Commonly known as: NITROSTAT Place 1 tablet (0.4 mg total) under the tongue every 5 (five) minutes as needed for chest pain (up to 3 doses).   nystatin cream Commonly known as: MYCOSTATIN Apply 1 application topically 2 (two) times a day.   tiZANidine 4 MG tablet Commonly known as: ZANAFLEX Take 4 mg by mouth at bedtime.      Follow-up Information    Redmond School, MD. Schedule an appointment as soon as possible for a visit in 1 week(s).   Specialty: Internal Medicine Why: discuss about getting an ENT referral Contact information: [address] [place] [address] [phone]           Time coordinating discharge: 39 minutes  Signed:  Richard Holz  [facility] 02/27/2019, 4:43 PM

## 2019-02-27 NOTE — ED Notes (Signed)
Pt provided breakfast tray and coca cola per hypoglycemia sidebar. Will recheck sugar.

## 2019-03-04 DIAGNOSIS — Z6838 Body mass index (BMI) 38.0-38.9, adult: Secondary | ICD-10-CM | POA: Diagnosis not present

## 2019-03-04 DIAGNOSIS — R809 Proteinuria, unspecified: Secondary | ICD-10-CM | POA: Diagnosis not present

## 2019-03-04 DIAGNOSIS — I10 Essential (primary) hypertension: Secondary | ICD-10-CM | POA: Diagnosis not present

## 2019-03-04 DIAGNOSIS — E559 Vitamin D deficiency, unspecified: Secondary | ICD-10-CM | POA: Diagnosis not present

## 2019-03-04 DIAGNOSIS — E1151 Type 2 diabetes mellitus with diabetic peripheral angiopathy without gangrene: Secondary | ICD-10-CM | POA: Diagnosis not present

## 2019-03-04 DIAGNOSIS — Z23 Encounter for immunization: Secondary | ICD-10-CM | POA: Diagnosis not present

## 2019-03-04 DIAGNOSIS — N183 Chronic kidney disease, stage 3 (moderate): Secondary | ICD-10-CM | POA: Diagnosis not present

## 2019-03-04 DIAGNOSIS — R491 Aphonia: Secondary | ICD-10-CM | POA: Diagnosis not present

## 2019-03-04 DIAGNOSIS — D509 Iron deficiency anemia, unspecified: Secondary | ICD-10-CM | POA: Diagnosis not present

## 2019-03-04 DIAGNOSIS — Z79899 Other long term (current) drug therapy: Secondary | ICD-10-CM | POA: Diagnosis not present

## 2019-03-04 LAB — HEMOGLOBIN A1C: Hemoglobin A1C: 9.8

## 2019-03-19 DIAGNOSIS — R69 Illness, unspecified: Secondary | ICD-10-CM | POA: Diagnosis not present

## 2019-03-26 ENCOUNTER — Encounter: Payer: Self-pay | Admitting: "Endocrinology

## 2019-03-26 ENCOUNTER — Other Ambulatory Visit: Payer: Self-pay

## 2019-03-26 ENCOUNTER — Ambulatory Visit (INDEPENDENT_AMBULATORY_CARE_PROVIDER_SITE_OTHER): Payer: Medicare HMO | Admitting: "Endocrinology

## 2019-03-26 VITALS — BP 142/80 | HR 78 | Ht 66.0 in | Wt 201.0 lb

## 2019-03-26 DIAGNOSIS — I10 Essential (primary) hypertension: Secondary | ICD-10-CM

## 2019-03-26 DIAGNOSIS — Z794 Long term (current) use of insulin: Secondary | ICD-10-CM | POA: Diagnosis not present

## 2019-03-26 DIAGNOSIS — E1122 Type 2 diabetes mellitus with diabetic chronic kidney disease: Secondary | ICD-10-CM

## 2019-03-26 DIAGNOSIS — N183 Chronic kidney disease, stage 3 (moderate): Secondary | ICD-10-CM | POA: Diagnosis not present

## 2019-03-26 MED ORDER — GLIPIZIDE ER 5 MG PO TB24: 5.0000 mg | ORAL_TABLET | Freq: Every day | ORAL | 3 refills | Status: DC

## 2019-03-26 NOTE — Progress Notes (Signed)
03/26/2019, 5:22 PM   Endocrinology follow-up note   Subjective:    Patient ID: Cassandra Adams, female    DOB: 08/06/1945.  Cassandra Adams is being seen in follow-up  for management of currently uncontrolled symptomatic diabetes requested by  Redmond School, MD.   Past Medical History:  Diagnosis Date  . Arthritis    "all over"  . CAD (coronary artery disease)    a. NSTEMI 12/2013 - occluded dominant RCA with L-R collaterals, moderate prox segmental LAD disease, for medical therapy initially, EF 60% with subtle inferobasal hypokinesia. Consider PCI for refractory CP.  [name] CKD (chronic kidney disease), stage III ([place])   . Hypertension   . Migraines    "weekly" (01/06/2014)  . Sickle cell trait ([place])   . TIA (transient ischemic attack) 1978  . Type II diabetes mellitus ([place])   . Vertigo    Past Surgical History:  Procedure Laterality Date  . APPENDECTOMY  March 2014   had appendix frozen  . CARDIAC CATHETERIZATION  "years ago" & 01/05/2014  . CARDIOVERSION N/A 03/17/2014   Procedure: CARDIOVERSION;  Surgeon: [name], MD;  Location: [facility] ENDOSCOPY;  Service: Cardiovascular;  Laterality: N/A;  . CATARACT EXTRACTION W/ INTRAOCULAR LENS  IMPLANT, BILATERAL Bilateral 04/2013-05/2013  . ENDARTERECTOMY Right 02/05/2017   Procedure: ENDARTERECTOMY CAROTID-RIGHT;  Surgeon: [name], MD;  Location: [facility] OR;  Service: Vascular;  Laterality: Right;  . ESOPHAGOGASTRODUODENOSCOPY  11/08/2007    Normal esophagus without evidence of Barrett, mass, erosion/ Normal stomach, duodenal bulb  . GASTRIC MOTILITY STUDY  11/13/2007   mildly delayed emptying subjectively, but normal  analysis-77% of tracer emptied at 2 hours  . JOINT REPLACEMENT    . LAPAROSCOPIC APPENDECTOMY N/A 09/15/2012   Procedure: APPENDECTOMY LAPAROSCOPIC;  Surgeon: [name], MD;  Location: [facility] ORS;  Service: General;  Laterality: N/A;   . LEFT HEART CATHETERIZATION WITH CORONARY ANGIOGRAM N/A 01/05/2014   Procedure: LEFT HEART CATHETERIZATION WITH CORONARY ANGIOGRAM;  Surgeon: [name], MD;  Location[facility] CATH LAB;  Service: Cardiovascular;  Laterality: N/A;  . REPLACEMENT TOTAL KNEE Right 05-03-06  . TEE WITHOUT CARDIOVERSION N/A 03/17/2014   Procedure: TRANSESOPHAGEAL ECHOCARDIOGRAM (TEE);  Surgeon: [name], MD;  Location: [facility] ENDOSCOPY;  Service: Cardiovascular;  Laterality: N/A;  . TONSILLECTOMY  1972   Social History   Socioeconomic History  . Marital status: Divorced    Spouse name: Not on file  . Number of children: Not on file  . Years of education: Not on file  . Highest education level: Not on file  Occupational History  . Not on file  Social Needs  . Financial resource strain: Not on file  . Food insecurity    Worry: Not on file    Inability: Not on file  . Transportation needs    Medical: Not on file    Non-medical: Not on file  Tobacco Use  . Smoking status: Never Smoker  . Smokeless tobacco: Never Used  Substance and Sexual Activity  . Alcohol use: No  . Drug use: No  . Sexual activity: Not Currently    Birth control/protection: Post-menopausal  Lifestyle  . Physical activity    Days per week:  Not on file    Minutes per session: Not on file  . Stress: Not on file  Relationships  . Social Herbalist on phone: Not on file    Gets together: Not on file    Attends religious service: Not on file    Active member of club or organization: Not on file    Attends meetings of clubs or organizations: Not on file    Relationship status: Not on file  Other Topics Concern  . Not on file  Social History Narrative  . Not on file   Outpatient Encounter Medications as of 03/26/2019  Medication Sig  . aspirin EC 81 MG tablet Take 81 mg by mouth daily.  [name] atorvastatin (LIPITOR) 80 MG tablet Take 1 tablet (80 mg total) by mouth every evening.  . carvedilol (COREG) 3.125 MG  tablet Take 3.125 mg by mouth 2 (two) times daily.  . colesevelam (WELCHOL) 625 MG tablet Take 1,875 mg by mouth 2 (two) times daily with a meal. *May take 6 tablets once a day with meals*  . ELIQUIS 5 MG TABS tablet Take 1 tablet (5 mg total) by mouth 2 (two) times daily.  . furosemide (LASIX) 40 MG tablet Take 1 tablet by mouth 2 (two) times daily.   [name] glipiZIDE (GLUCOTROL XL) 5 MG 24 hr tablet Take 1 tablet (5 mg total) by mouth daily with breakfast.  . hydrALAZINE (APRESOLINE) 25 MG tablet Take 1 tablet (25 mg total) by mouth 3 (three) times daily.  . Insulin Glargine (BASAGLAR KWIKPEN) 100 UNIT/ML SOPN Inject 36 Units into the skin at bedtime.  . insulin lispro (HUMALOG) 100 UNIT/ML injection Inject 5-11 Units into the skin 3 (three) times daily before meals. Per sliding scale  . lisinopril (ZESTRIL) 20 MG tablet Take 20 mg by mouth daily.  . nitroGLYCERIN (NITROSTAT) 0.4 MG SL tablet Place 1 tablet (0.4 mg total) under the tongue every 5 (five) minutes as needed for chest pain (up to 3 doses).  . nystatin cream (MYCOSTATIN) Apply 1 application topically 2 (two) times a day.   [name] tiZANidine (ZANAFLEX) 4 MG tablet Take 4 mg by mouth at bedtime.    No facility-administered encounter medications on file as of 03/26/2019.     ALLERGIES: Allergies  Allergen Reactions  . Penicillins Hives    Has patient had a PCN reaction causing immediate rash, facial/tongue/throat swelling, SOB or lightheadedness with hypotension: no Has patient had a PCN reaction causing severe rash involving mucus membranes or skin necrosis: No  Has patient had a PCN reaction that required hospitalization: no Has patient had a PCN reaction occurring within the last 10 years: no If all of the above answers are "NO", then may proceed with Cephalosporin use.     VACCINATION STATUS:  There is no immunization history on file for this patient.  Diabetes She presents for her follow-up diabetic visit. She has type 2  diabetes mellitus. Onset time: She was diagnosed at approximate age of 46 years. Her disease course has been worsening. Pertinent negatives for hypoglycemia include no confusion, headaches, pallor or seizures. Associated symptoms include fatigue, foot paresthesias, foot ulcerations and polyuria. Pertinent negatives for diabetes include no chest pain, no polydipsia and no polyphagia. Diabetic complications include heart disease, nephropathy, PVD and retinopathy. Risk factors for coronary artery disease include family history, dyslipidemia, diabetes mellitus, hypertension, sedentary lifestyle and post-menopausal. Current diabetic treatment includes insulin injections and oral agent (monotherapy). Her weight is increasing steadily. She is  following a generally unhealthy diet. When asked about meal planning, she reported none. She has not had a previous visit with a dietitian. She participates in exercise intermittently. (She is accompanied by her grown daughter.  Did not bring any logs nor meter with her to review.  Her recent A1c was 9.8%.   ) An ACE inhibitor/angiotensin II receptor blocker is being taken. Eye exam is current (She has retinopathy status post multiple laser therapies on bilateral eyes.).  Hyperlipidemia This is a chronic problem. The current episode started more than 1 year ago. The problem is controlled. Recent lipid tests were reviewed and are normal. Exacerbating diseases include diabetes. Pertinent negatives include no chest pain, myalgias or shortness of breath. Current antihyperlipidemic treatment includes statins. Risk factors for coronary artery disease include diabetes mellitus, dyslipidemia, family history, hypertension, a sedentary lifestyle and post-menopausal.  Hypertension This is a chronic problem. The current episode started more than 1 year ago. The problem is uncontrolled. Pertinent negatives include no chest pain, headaches, palpitations or shortness of breath. Risk factors for  coronary artery disease include dyslipidemia, diabetes mellitus, family history, sedentary lifestyle and post-menopausal state. Past treatments include ACE inhibitors. Hypertensive end-organ damage includes PVD and retinopathy.    Review of Systems  Constitutional: Positive for fatigue. Negative for chills, fever and unexpected weight change.  HENT: Negative for trouble swallowing and voice change.   Eyes: Negative for visual disturbance.  Respiratory: Negative for cough, shortness of breath and wheezing.   Cardiovascular: Negative for chest pain, palpitations and leg swelling.  Gastrointestinal: Negative for diarrhea, nausea and vomiting.  Endocrine: Positive for polyuria. Negative for cold intolerance, heat intolerance, polydipsia and polyphagia.  Musculoskeletal: Negative for arthralgias and myalgias.  Skin: Negative for color change, pallor, rash and wound.  Neurological: Negative for seizures and headaches.  Psychiatric/Behavioral: Negative for confusion and suicidal ideas.    Objective:    BP (!) 142/80   Pulse 78   Ht 5\' 6"  (1.676 m)   Wt 201 lb (91.2 kg)   BMI 32.44 kg/m   Wt Readings from Last 3 Encounters:  03/26/19 201 lb (91.2 kg)  11/26/17 176 lb (79.8 kg)  06/27/17 172 lb (78 kg)     Physical Exam  Constitutional: She is oriented to person, place, and time. She appears well-developed.  HENT:  Head: Normocephalic and atraumatic.  Eyes: EOM are normal.  Neck: Normal range of motion. Neck supple. No tracheal deviation present. No thyromegaly present.  Cardiovascular: Normal rate and regular rhythm.  Pulmonary/Chest: Effort normal and breath sounds normal.  Abdominal: Soft. Bowel sounds are normal. There is no abdominal tenderness. There is no guarding.  Musculoskeletal: Normal range of motion.        General: No edema.  Neurological: She is alert and oriented to person, place, and time. She has normal reflexes. No cranial nerve deficit. Coordination normal.  She  is aphasic.  Skin: Skin is warm and dry. No rash noted. No erythema. No pallor.  Psychiatric: She has a normal mood and affect. Judgment normal.    CMP ( most recent) CMP     Component Value Date/Time   NA 141 02/27/2019 0500   NA 145 (H) 12/03/2017 0816   K 3.9 02/27/2019 0500   CL 108 02/27/2019 0500   CO2 25 02/27/2019 0500   GLUCOSE 138 (H) 02/27/2019 0500   BUN 22 02/27/2019 0500   BUN 18 12/03/2017 0816   CREATININE 1.19 (H) 02/27/2019 0500   CREATININE 1.40 (H) 07/01/2015  4401   CALCIUM 9.4 02/27/2019 0500   PROT 7.4 02/26/2019 1906   PROT 6.3 12/03/2017 0816   ALBUMIN 3.6 02/26/2019 1906   ALBUMIN 3.5 12/03/2017 0816   AST 42 (H) 02/26/2019 1906   ALT 31 02/26/2019 1906   ALKPHOS 100 02/26/2019 1906   BILITOT 0.4 02/26/2019 1906   BILITOT 0.2 12/03/2017 0816   GFRNONAA 45 (L) 02/27/2019 0500   GFRAA 52 (L) 02/27/2019 0500     Diabetic Labs (most recent): Lab Results  Component Value Date   HGBA1C 9.8 03/04/2019   HGBA1C 9.0 (H) 02/26/2019   HGBA1C 8.9 (H) 02/05/2017     Lipid Panel ( most recent) Lipid Panel     Component Value Date/Time   CHOL 110 12/03/2017 0816   TRIG 50 12/03/2017 0816   HDL 58 12/03/2017 0816   CHOLHDL 1.9 12/03/2017 0816   CHOLHDL 2.2 01/20/2017 0609   VLDL 11 01/20/2017 0609   LDLCALC 42 12/03/2017 0816      Assessment & Plan:   1. DM type 2 causing vascular disease ([place])  - Cassandra Adams has currently uncontrolled symptomatic type 2 DM since  73 years of age. -Did not return for follow-up since she was seen in December 2018.  She is returning with higher A1c of 9.8%.    -her diabetes is complicated by coronary artery disease, Arbie Cookey date atherosclerosis, renal insufficiency, retinopathy, peripheral neuropathy and Cassandra Adams remains at a high risk for more acute and chronic complications which include CAD, CVA, CKD, retinopathy, and neuropathy. These are all discussed in detail with the patient.  - I  have counseled her on diet management and weight loss, by adopting a carbohydrate restricted/protein rich diet. - she  admits there is a room for improvement in her diet and drink choices. -  Suggestion is made for her to avoid simple carbohydrates  from her diet including Cakes, Sweet Desserts / Pastries, Ice Cream, Soda (diet and regular), Sweet Tea, Candies, Chips, Cookies, Sweet Pastries,  Store Bought Juices, Alcohol in Excess of  1-2 drinks a day, Artificial Sweeteners, Coffee Creamer, and "Sugar-free" Products. This will help patient to have stable blood glucose profile and potentially avoid unintended weight gain.   - I encouraged her to switch to  unprocessed or minimally processed complex starch and increased protein intake (animal or plant source), fruits, and vegetables.  - she is advised to stick to a routine mealtimes to eat 3 meals  a day and avoid unnecessary snacks ( to snack only to correct hypoglycemia).   - she will be scheduled with Jearld Fenton, RDN, CDE for individualized diabetes education.  - I have approached her with the following individualized plan to manage diabetes and patient agrees:   - She will continue to require intensive treatment with basal/bolus insulin in order for her to achieve control of diabetes to target.  Her accompanying daughter is offering to help her.   -Priority #1 in the care of her diabetes will be to avoid hypoglycemia. She is advised to lower her Basaglar to 36 units nightly, adjusted her Humalog to 5 units 3 times daily AC for pre-meal blood glucose readings above 90 mg/dl   Associated with strict monitoring of blood glucose 4 times daily--before meals and at bedtime.   - Patient is warned not to take insulin without proper monitoring per orders. -She would benefit from therapy with low-dose glipizide.  I discussed and initiated glipizide 5 mg XL p.o. daily at breakfast. -Patient  is encouraged to call clinic for blood glucose levels less  than 70 or above 300 mg /dl.  -Patient is not a candidate for metformin,SGLT2 inhibitors due to CKD. - she will be considered for incretin therapy as appropriate next visit. - Patient specific target  A1c;  LDL, HDL, Triglycerides, and  Waist Circumference were discussed in detail.  2) BP/HTN: Her blood pressure is not controlled to target.  She is advised to resume and continue lisinopril 20 mg p.o. daily at breakfast.    3) Lipids/HPL: Her recent lipid panel showed controlled LDL at 44.  She is currently on atorvastatin 80 mg p.o. nightly.  [name] 4)  Weight/Diet: CDE Consult will be initiated , exercise, and detailed carbohydrates information provided.  5) Chronic Care/Health Maintenance:  -she  is on ACEI/ARB and Statin medications and  is encouraged to continue to follow up with Ophthalmology, Dentist,  Podiatrist at least yearly or according to recommendations, and advised to  stay away from smoking. I have recommended yearly flu vaccine and pneumonia vaccination at least every 5 years; moderate intensity exercise for up to 150 minutes weekly; and  sleep for at least 7 hours a day.  - I advised patient to maintain close follow up with Redmond School, MD for primary care needs.  - Patient Care Time Today:  40 min, of which >50% was spent in  counseling and the rest reviewing her  current and  previous labs/studies, previous treatments, her blood glucose readings, and medications' doses and developing a plan for long-term care based on the latest recommendations for standards of care.   [name] participated in the discussions, expressed understanding, and voiced agreement with the above plans.  All questions were answered to her satisfaction. she is encouraged to contact clinic should she have any questions or concerns prior to her return visit.   Follow up plan: - Return in about 10 days (around 04/05/2019) for Follow up with Meter and Logs Only - no Labs.  Glade Lloyd,  MD [facility] [address] [place], [place] [address] Phone: [phone]  Fax: [phone]    03/26/2019, 5:22 PM  This note was partially dictated with voice recognition software. Similar sounding words can be transcribed inadequately or may not  be corrected upon review.

## 2019-03-26 NOTE — Patient Instructions (Signed)

## 2019-03-31 ENCOUNTER — Telehealth: Payer: Self-pay | Admitting: "Endocrinology

## 2019-03-31 NOTE — Telephone Encounter (Signed)
Patient's readings have been high all weekend.  Friday - 8:29am 459, 1:30pm 419, 5:40pm 337, 11:36pm 417 Saturday - 6:55am 375, 2:44pm 362, 6:18pm 450, 11:38pm 477 Sunday - 6:53am 370, 2:54pm 261, 6:20pm 478, 11:30pm 600 Monday - 7:29am 433 please advise.

## 2019-03-31 NOTE — Telephone Encounter (Signed)
Advise to increase Basaglar to 40 units nightly, adjusted her Humalog to 10 units 3 times daily AC for pre-meal blood glucose readings above 90 mg/dl ,   Associated with strict monitoring of blood glucose 4 times daily--before meals and at bedtime.

## 2019-03-31 NOTE — Telephone Encounter (Signed)
Pt.notified

## 2019-04-09 ENCOUNTER — Ambulatory Visit (INDEPENDENT_AMBULATORY_CARE_PROVIDER_SITE_OTHER): Payer: Medicare HMO | Admitting: "Endocrinology

## 2019-04-09 ENCOUNTER — Encounter: Payer: Self-pay | Admitting: "Endocrinology

## 2019-04-09 ENCOUNTER — Other Ambulatory Visit: Payer: Self-pay

## 2019-04-09 DIAGNOSIS — N1831 Chronic kidney disease, stage 3a: Secondary | ICD-10-CM | POA: Diagnosis not present

## 2019-04-09 DIAGNOSIS — I10 Essential (primary) hypertension: Secondary | ICD-10-CM

## 2019-04-09 DIAGNOSIS — E1121 Type 2 diabetes mellitus with diabetic nephropathy: Secondary | ICD-10-CM

## 2019-04-09 DIAGNOSIS — Z794 Long term (current) use of insulin: Secondary | ICD-10-CM

## 2019-04-09 NOTE — Progress Notes (Signed)
04/09/2019, 4:20 PM                                                     Endocrinology Telehealth Visit Follow up Note -During COVID -19 Pandemic  This visit type was conducted due to national recommendations for restrictions regarding the COVID-19 Pandemic  in an effort to limit this patient's exposure and mitigate transmission of the corona virus.  Due to her co-morbid illnesses, Cassandra Adams is at  moderate to high risk for complications without adequate follow up.  This format is felt to be most appropriate for her at this time.  I connected with this patient on 04/09/2019   by telephone and verified that I am speaking with the correct person using two identifiers. Cassandra Adams, 1946/03/17. she has verbally consented to this visit. All issues noted in this document were discussed and addressed. The format was not optimal for physical exam.  She is assisted by her daughter during this telephone visit.  Subjective:    Patient ID: Cassandra Adams, female    DOB: June 26, 1946.  Cassandra Adams is being engaged in telehealth via telephone in follow-up  for management of currently uncontrolled symptomatic diabetes requested by  Redmond School, MD.   Past Medical History:  Diagnosis Date  . Arthritis    "all over"  . CAD (coronary artery disease)    a. NSTEMI 12/2013 - occluded dominant RCA with L-R collaterals, moderate prox segmental LAD disease, for medical therapy initially, EF 60% with subtle inferobasal hypokinesia. Consider PCI for refractory CP.  [name] CKD (chronic kidney disease), stage III   . Hypertension   . Migraines    "weekly" (01/06/2014)  . Sickle cell trait ([place])   . TIA (transient ischemic attack) 1978  . Type II diabetes mellitus ([place])   . Vertigo    Past Surgical History:  Procedure Laterality Date  . APPENDECTOMY  March 2014   had appendix frozen  . CARDIAC CATHETERIZATION   "years ago" & 01/05/2014  . CARDIOVERSION N/A 03/17/2014   Procedure: CARDIOVERSION;  Surgeon: [name], MD;  Location: [facility] ENDOSCOPY;  Service: Cardiovascular;  Laterality: N/A;  . CATARACT EXTRACTION W/ INTRAOCULAR LENS  IMPLANT, BILATERAL Bilateral 04/2013-05/2013  . ENDARTERECTOMY Right 02/05/2017   Procedure: ENDARTERECTOMY CAROTID-RIGHT;  Surgeon: [name], MD;  Location: [facility] OR;  Service: Vascular;  Laterality: Right;  . ESOPHAGOGASTRODUODENOSCOPY  11/08/2007    Normal esophagus without evidence of Barrett, mass, erosion/ Normal stomach, duodenal bulb  . GASTRIC MOTILITY STUDY  11/13/2007   mildly delayed emptying subjectively, but normal  analysis-77% of tracer emptied at 2 hours  . JOINT REPLACEMENT    . LAPAROSCOPIC APPENDECTOMY N/A 09/15/2012   Procedure: APPENDECTOMY LAPAROSCOPIC;  Surgeon: [name], MD;  Location: [facility] ORS;  Service: General;  Laterality: N/A;  . LEFT HEART CATHETERIZATION WITH CORONARY ANGIOGRAM N/A 01/05/2014   Procedure: LEFT HEART CATHETERIZATION WITH CORONARY ANGIOGRAM;  Surgeon: [name], MD;  Location: [facility] CATH LAB;  Service: Cardiovascular;  Laterality: N/A;  . REPLACEMENT  TOTAL KNEE Right 05-03-06  . TEE WITHOUT CARDIOVERSION N/A 03/17/2014   Procedure: TRANSESOPHAGEAL ECHOCARDIOGRAM (TEE);  Surgeon: [name], MD;  Location: [facility] ENDOSCOPY;  Service: Cardiovascular;  Laterality: N/A;  . TONSILLECTOMY  1972   Social History   Socioeconomic History  . Marital status: Divorced    Spouse name: Not on file  . Number of children: Not on file  . Years of education: Not on file  . Highest education level: Not on file  Occupational History  . Not on file  Social Needs  . Financial resource strain: Not on file  . Food insecurity    Worry: Not on file    Inability: Not on file  . Transportation needs    Medical: Not on file    Non-medical: Not on file  Tobacco Use  . Smoking status: Never Smoker  . Smokeless tobacco: Never  Used  Substance and Sexual Activity  . Alcohol use: No  . Drug use: No  . Sexual activity: Not Currently    Birth control/protection: Post-menopausal  Lifestyle  . Physical activity    Days per week: Not on file    Minutes per session: Not on file  . Stress: Not on file  Relationships  . Social Herbalist on phone: Not on file    Gets together: Not on file    Attends religious service: Not on file    Active member of club or organization: Not on file    Attends meetings of clubs or organizations: Not on file    Relationship status: Not on file  Other Topics Concern  . Not on file  Social History Narrative  . Not on file   Outpatient Encounter Medications as of 04/09/2019  Medication Sig  . aspirin EC 81 MG tablet Take 81 mg by mouth daily.  [name] atorvastatin (LIPITOR) 80 MG tablet Take 1 tablet (80 mg total) by mouth every evening.  . carvedilol (COREG) 3.125 MG tablet Take 3.125 mg by mouth 2 (two) times daily.  . colesevelam (WELCHOL) 625 MG tablet Take 1,875 mg by mouth 2 (two) times daily with a meal. *May take 6 tablets once a day with meals*  . ELIQUIS 5 MG TABS tablet Take 1 tablet (5 mg total) by mouth 2 (two) times daily.  . furosemide (LASIX) 40 MG tablet Take 1 tablet by mouth 2 (two) times daily.   [name] glipiZIDE (GLUCOTROL XL) 5 MG 24 hr tablet Take 1 tablet (5 mg total) by mouth daily with breakfast.  . hydrALAZINE (APRESOLINE) 25 MG tablet Take 1 tablet (25 mg total) by mouth 3 (three) times daily.  . Insulin Glargine (BASAGLAR KWIKPEN) 100 UNIT/ML SOPN Inject 40 Units into the skin at bedtime.  . insulin lispro (HUMALOG) 100 UNIT/ML injection Inject 10-16 Units into the skin 3 (three) times daily before meals.  [name] lisinopril (ZESTRIL) 20 MG tablet Take 20 mg by mouth daily.  . nitroGLYCERIN (NITROSTAT) 0.4 MG SL tablet Place 1 tablet (0.4 mg total) under the tongue every 5 (five) minutes as needed for chest pain (up to 3 doses).  . nystatin cream (MYCOSTATIN)  Apply 1 application topically 2 (two) times a day.   [name] tiZANidine (ZANAFLEX) 4 MG tablet Take 4 mg by mouth at bedtime.    No facility-administered encounter medications on file as of 04/09/2019.     ALLERGIES: Allergies  Allergen Reactions  . Penicillins Hives    Has patient had a PCN reaction causing immediate  rash, facial/tongue/throat swelling, SOB or lightheadedness with hypotension: no Has patient had a PCN reaction causing severe rash involving mucus membranes or skin necrosis: No  Has patient had a PCN reaction that required hospitalization: no Has patient had a PCN reaction occurring within the last 10 years: no If all of the above answers are "NO", then may proceed with Cephalosporin use.     VACCINATION STATUS:  There is no immunization history on file for this patient.  Diabetes She presents for her follow-up diabetic visit. She has type 2 diabetes mellitus. Onset time: She was diagnosed at approximate age of 65 years. Her disease course has been improving. Pertinent negatives for hypoglycemia include no confusion, headaches, pallor or seizures. Pertinent negatives for diabetes include no chest pain, no fatigue, no foot paresthesias, no foot ulcerations, no polydipsia, no polyphagia and no polyuria. Symptoms are improving. Diabetic complications include heart disease, nephropathy, PVD and retinopathy. Risk factors for coronary artery disease include family history, dyslipidemia, diabetes mellitus, hypertension, sedentary lifestyle and post-menopausal. Current diabetic treatment includes insulin injections and oral agent (monotherapy). She is following a generally unhealthy diet. When asked about meal planning, she reported none. She has not had a previous visit with a dietitian. She participates in exercise intermittently. Her breakfast blood glucose range is generally 140-180 mg/dl. Her lunch blood glucose range is generally 140-180 mg/dl. Her dinner blood glucose range is  generally 140-180 mg/dl. Her bedtime blood glucose range is generally 140-180 mg/dl. Her overall blood glucose range is 140-180 mg/dl. (She reports near target glycemic profile, both fasting and postprandial.  Her recent A1c was 9.8%.   ) An ACE inhibitor/angiotensin II receptor blocker is being taken. Eye exam is current (She has retinopathy status post multiple laser therapies on bilateral eyes.).  Hyperlipidemia This is a chronic problem. The current episode started more than 1 year ago. The problem is controlled. Recent lipid tests were reviewed and are normal. Exacerbating diseases include diabetes. Pertinent negatives include no chest pain, myalgias or shortness of breath. Current antihyperlipidemic treatment includes statins. Risk factors for coronary artery disease include diabetes mellitus, dyslipidemia, family history, hypertension, a sedentary lifestyle and post-menopausal.  Hypertension This is a chronic problem. The current episode started more than 1 year ago. The problem is uncontrolled. Pertinent negatives include no chest pain, headaches, palpitations or shortness of breath. Risk factors for coronary artery disease include dyslipidemia, diabetes mellitus, family history, sedentary lifestyle and post-menopausal state. Past treatments include ACE inhibitors. Hypertensive end-organ damage includes PVD and retinopathy.    Review of systems: Limited as above.  Objective:    There were no vitals taken for this visit.  Wt Readings from Last 3 Encounters:  03/26/19 201 lb (91.2 kg)  11/26/17 176 lb (79.8 kg)  06/27/17 172 lb (78 kg)     CMP ( most recent) CMP     Component Value Date/Time   NA 141 02/27/2019 0500   NA 145 (H) 12/03/2017 0816   K 3.9 02/27/2019 0500   CL 108 02/27/2019 0500   CO2 25 02/27/2019 0500   GLUCOSE 138 (H) 02/27/2019 0500   BUN 22 02/27/2019 0500   BUN 18 12/03/2017 0816   CREATININE 1.19 (H) 02/27/2019 0500   CREATININE 1.40 (H) 07/01/2015 0926    CALCIUM 9.4 02/27/2019 0500   PROT 7.4 02/26/2019 1906   PROT 6.3 12/03/2017 0816   ALBUMIN 3.6 02/26/2019 1906   ALBUMIN 3.5 12/03/2017 0816   AST 42 (H) 02/26/2019 1906   ALT 31 02/26/2019  1906   ALKPHOS 100 02/26/2019 1906   BILITOT 0.4 02/26/2019 1906   BILITOT 0.2 12/03/2017 0816   GFRNONAA 45 (L) 02/27/2019 0500   GFRAA 52 (L) 02/27/2019 0500     Diabetic Labs (most recent): Lab Results  Component Value Date   HGBA1C 9.8 03/04/2019   HGBA1C 9.0 (H) 02/26/2019   HGBA1C 8.9 (H) 02/05/2017     Lipid Panel ( most recent) Lipid Panel     Component Value Date/Time   CHOL 110 12/03/2017 0816   TRIG 50 12/03/2017 0816   HDL 58 12/03/2017 0816   CHOLHDL 1.9 12/03/2017 0816   CHOLHDL 2.2 01/20/2017 0609   VLDL 11 01/20/2017 0609   LDLCALC 42 12/03/2017 0816      Assessment & Plan:   1. DM type 2 causing vascular disease ([place])  - Cassandra Adams has currently uncontrolled symptomatic type 2 DM since  73 years of age. -She is reporting near target glycemic profile on adjusted basal/bolus insulin.  This is despite her recent A1c of 9.8%.     -her diabetes is complicated by coronary artery disease, Cassandra Adams date atherosclerosis, renal insufficiency, retinopathy, peripheral neuropathy and Cassandra Adams remains at a high risk for more acute and chronic complications which include CAD, CVA, CKD, retinopathy, and neuropathy. These are all discussed in detail with the patient.  - I have counseled her on diet management and weight loss, by adopting a carbohydrate restricted/protein rich diet.  - she  admits there is a room for improvement in her diet and drink choices. -  Suggestion is made for her to avoid simple carbohydrates  from her diet including Cakes, Sweet Desserts / Pastries, Ice Cream, Soda (diet and regular), Sweet Tea, Candies, Chips, Cookies, Sweet Pastries,  Store Bought Juices, Alcohol in Excess of  1-2 drinks a day, Artificial Sweeteners, Coffee Creamer,  and "Sugar-free" Products. This will help patient to have stable blood glucose profile and potentially avoid unintended weight gain.   - I encouraged her to switch to  unprocessed or minimally processed complex starch and increased protein intake (animal or plant source), fruits, and vegetables.  - she is advised to stick to a routine mealtimes to eat 3 meals  a day and avoid unnecessary snacks ( to snack only to correct hypoglycemia).   - she will be scheduled with Cassandra Adams, Cassandra Adams, Cassandra Adams for individualized diabetes education.  - I have approached her with the following individualized plan to manage diabetes and patient agrees:   -Based on her glycemic response, she will continue to need intensive treatment with basal/bolus insulin in order for her to maintain control of diabetes to target.   - Her accompanying daughter is offering to help her.   -Priority #1 in the care of her diabetes will be to avoid hypoglycemia. She is advised to continue Basaglar 40 units nightly, continue Humalog 10  units 3 times daily AC for pre-meal blood glucose readings above 90 mg/dl   Associated with strict monitoring of blood glucose 4 times daily--before meals and at bedtime.   - Patient is warned not to take insulin without proper monitoring per orders. -She will continue to benefit from low-dose glipizide therapy.  She is advised to continue glipizide 5 mg XL p.o. daily at breakfast.   -Patient is encouraged to call clinic for blood glucose levels less than 70 or above 300 mg /dl.  -Patient is not a candidate for metformin,SGLT2 inhibitors due to CKD. - she will be considered for  incretin therapy as appropriate next visit. - Patient specific target  A1c;  LDL, HDL, Triglycerides, and  Waist Circumference were discussed in detail.  2) BP/HTN: she is advised to home monitor blood pressure and report if > 140/90 on 2 separate readings.   She is advised to resume and continue lisinopril 20 mg p.o. daily at  breakfast.    3) Lipids/HPL: Her recent lipid panel showed controlled LDL at 44.  She is advised to continue atorvastatin 80 mg p.o. nightly.  Side effects and precautions discussed with her.    4)  Weight/Diet: Cassandra Adams Consult will be initiated , exercise, and detailed carbohydrates information provided.  5) Chronic Care/Health Maintenance:  -she  is on ACEI/ARB and Statin medications and  is encouraged to continue to follow up with Ophthalmology, Dentist,  Podiatrist at least yearly or according to recommendations, and advised to  stay away from smoking. I have recommended yearly flu vaccine and pneumonia vaccination at least every 5 years; moderate intensity exercise for up to 150 minutes weekly; and  sleep for at least 7 hours a day.  - I advised patient to maintain close follow up with Redmond School, MD for primary care needs.  - Patient Care Time Today:  25 min, of which >50% was spent in  counseling and the rest reviewing her  current and  previous labs/studies, previous treatments, her blood glucose readings, and medications' doses and developing a plan for long-term care based on the latest recommendations for standards of care.   Cassandra Adams participated in the discussions, expressed understanding, and voiced agreement with the above plans.  All questions were answered to her satisfaction. she is encouraged to contact clinic should she have any questions or concerns prior to her return visit.   Follow up plan: - Return in about 3 months (around 07/10/2019) for Bring Meter and Logs- A1c in Office, Include 8 log sheets.  Glade Lloyd, MD [facility] [address] [place], [place] [address] Phone: [phone]  Fax: [phone]    04/09/2019, 4:20 PM  This note was partially dictated with voice recognition software. Similar sounding words can be transcribed inadequately or may not  be corrected upon review.

## 2019-04-10 ENCOUNTER — Ambulatory Visit: Payer: Medicare Other | Admitting: "Endocrinology

## 2019-04-10 DIAGNOSIS — E1121 Type 2 diabetes mellitus with diabetic nephropathy: Secondary | ICD-10-CM | POA: Diagnosis not present

## 2019-04-10 DIAGNOSIS — I10 Essential (primary) hypertension: Secondary | ICD-10-CM | POA: Diagnosis not present

## 2019-04-10 DIAGNOSIS — N1831 Chronic kidney disease, stage 3a: Secondary | ICD-10-CM | POA: Diagnosis not present

## 2019-04-10 DIAGNOSIS — Z794 Long term (current) use of insulin: Secondary | ICD-10-CM | POA: Diagnosis not present

## 2019-04-12 ENCOUNTER — Other Ambulatory Visit: Payer: Self-pay

## 2019-04-12 ENCOUNTER — Emergency Department (HOSPITAL_COMMUNITY): Payer: Medicare HMO

## 2019-04-12 ENCOUNTER — Emergency Department (HOSPITAL_COMMUNITY)
Admission: EM | Admit: 2019-04-12 | Discharge: 2019-04-12 | Disposition: A | Discharge status: Home / Self Care | Payer: Medicare HMO | Attending: Emergency Medicine | Admitting: Emergency Medicine

## 2019-04-12 ENCOUNTER — Encounter (HOSPITAL_COMMUNITY): Payer: Self-pay | Admitting: Emergency Medicine

## 2019-04-12 DIAGNOSIS — T148XXA Other injury of unspecified body region, initial encounter: Secondary | ICD-10-CM

## 2019-04-12 DIAGNOSIS — T1490XA Injury, unspecified, initial encounter: Secondary | ICD-10-CM

## 2019-04-12 DIAGNOSIS — I129 Hypertensive chronic kidney disease with stage 1 through stage 4 chronic kidney disease, or unspecified chronic kidney disease: Secondary | ICD-10-CM | POA: Insufficient documentation

## 2019-04-12 DIAGNOSIS — Y999 Unspecified external cause status: Secondary | ICD-10-CM | POA: Insufficient documentation

## 2019-04-12 DIAGNOSIS — Z79899 Other long term (current) drug therapy: Secondary | ICD-10-CM | POA: Diagnosis not present

## 2019-04-12 DIAGNOSIS — N183 Chronic kidney disease, stage 3 unspecified: Secondary | ICD-10-CM | POA: Diagnosis not present

## 2019-04-12 DIAGNOSIS — W010XXA Fall on same level from slipping, tripping and stumbling without subsequent striking against object, initial encounter: Secondary | ICD-10-CM | POA: Insufficient documentation

## 2019-04-12 DIAGNOSIS — S0083XA Contusion of other part of head, initial encounter: Secondary | ICD-10-CM | POA: Diagnosis not present

## 2019-04-12 DIAGNOSIS — Y939 Activity, unspecified: Secondary | ICD-10-CM | POA: Diagnosis not present

## 2019-04-12 DIAGNOSIS — Y929 Unspecified place or not applicable: Secondary | ICD-10-CM | POA: Insufficient documentation

## 2019-04-12 DIAGNOSIS — E1122 Type 2 diabetes mellitus with diabetic chronic kidney disease: Secondary | ICD-10-CM | POA: Diagnosis not present

## 2019-04-12 DIAGNOSIS — R55 Syncope and collapse: Secondary | ICD-10-CM

## 2019-04-12 DIAGNOSIS — S3993XA Unspecified injury of pelvis, initial encounter: Secondary | ICD-10-CM | POA: Diagnosis not present

## 2019-04-12 DIAGNOSIS — S0990XA Unspecified injury of head, initial encounter: Secondary | ICD-10-CM | POA: Diagnosis not present

## 2019-04-12 DIAGNOSIS — R220 Localized swelling, mass and lump, head: Secondary | ICD-10-CM | POA: Diagnosis not present

## 2019-04-12 DIAGNOSIS — S00212A Abrasion of left eyelid and periocular area, initial encounter: Secondary | ICD-10-CM | POA: Diagnosis not present

## 2019-04-12 DIAGNOSIS — S0081XA Abrasion of other part of head, initial encounter: Secondary | ICD-10-CM | POA: Diagnosis not present

## 2019-04-12 DIAGNOSIS — S299XXA Unspecified injury of thorax, initial encounter: Secondary | ICD-10-CM | POA: Diagnosis not present

## 2019-04-12 DIAGNOSIS — R079 Chest pain, unspecified: Secondary | ICD-10-CM | POA: Diagnosis not present

## 2019-04-12 DIAGNOSIS — Z7982 Long term (current) use of aspirin: Secondary | ICD-10-CM | POA: Diagnosis not present

## 2019-04-12 DIAGNOSIS — Z794 Long term (current) use of insulin: Secondary | ICD-10-CM | POA: Insufficient documentation

## 2019-04-12 DIAGNOSIS — I2510 Atherosclerotic heart disease of native coronary artery without angina pectoris: Secondary | ICD-10-CM | POA: Diagnosis not present

## 2019-04-12 DIAGNOSIS — R102 Pelvic and perineal pain: Secondary | ICD-10-CM | POA: Diagnosis not present

## 2019-04-12 LAB — URINALYSIS, ROUTINE W REFLEX MICROSCOPIC
Bacteria, UA: NONE SEEN
Bilirubin Urine: NEGATIVE
Glucose, UA: 150 mg/dL — AB
Ketones, ur: 5 mg/dL — AB
Nitrite: NEGATIVE
Protein, ur: 30 mg/dL — AB
Specific Gravity, Urine: 1.009 (ref 1.005–1.030)
pH: 5 (ref 5.0–8.0)

## 2019-04-12 LAB — BASIC METABOLIC PANEL
Anion gap: 9 (ref 5–15)
BUN: 19 mg/dL (ref 8–23)
CO2: 24 mmol/L (ref 22–32)
Calcium: 10.3 mg/dL (ref 8.9–10.3)
Chloride: 104 mmol/L (ref 98–111)
Creatinine, Ser: 1.33 mg/dL — ABNORMAL HIGH (ref 0.44–1.00)
GFR calc Af Amer: 46 mL/min — ABNORMAL LOW (ref >60)
GFR calc non Af Amer: 39 mL/min — ABNORMAL LOW (ref >60)
Glucose, Bld: 180 mg/dL — ABNORMAL HIGH (ref 70–99)
Potassium: 4.9 mmol/L (ref 3.5–5.1)
Sodium: 137 mmol/L (ref 135–145)

## 2019-04-12 LAB — CBC
HCT: 38.9 % (ref 36.0–46.0)
Hemoglobin: 13.4 g/dL (ref 12.0–15.0)
MCH: 28.3 pg (ref 26.0–34.0)
MCHC: 34.4 g/dL (ref 30.0–36.0)
MCV: 82.2 fL (ref 80.0–100.0)
Platelets: 183 10*3/uL (ref 150–400)
RBC: 4.73 MIL/uL (ref 3.87–5.11)
RDW: 13.4 % (ref 11.5–15.5)
WBC: 8.5 10*3/uL (ref 4.0–10.5)
nRBC: 0 % (ref 0.0–0.2)

## 2019-04-12 LAB — CBG MONITORING, ED: Glucose-Capillary: 147 mg/dL — ABNORMAL HIGH (ref 70–99)

## 2019-04-12 MED ORDER — SODIUM CHLORIDE 0.9% FLUSH: 3.0000 mL | Freq: Once | INTRAVENOUS | Status: DC

## 2019-04-12 NOTE — ED Notes (Signed)
Patient transported to CT 

## 2019-04-12 NOTE — Discharge Instructions (Signed)
Recommend Tylenol for any pain.  If you develop

## 2019-04-12 NOTE — ED Provider Notes (Signed)
[facility] DEPT Provider Note   CSN: [id number] Arrival date & time: 04/12/19  1114     History   Chief Complaint Chief Complaint  Patient presents with  . Loss of Consciousness  . Fall  . Head Injury    HPI Cassandra Adams is a 73 y.o. female.  Past medical history of type 2 diabetes, on insulin, coronary artery disease, hypertension, CKD presents to ER after syncopal episode, trauma to head.  History provided by daughter as well as patient.  Patient's history is somewhat limited due to her speech difficulties.  Patient had syncopal episode sometime this morning after getting up, fell hitting a dresser onto her left face.  No eye pain, no blurry vision.   Patient has been having speech issues, currently severe to the point where she cannot talk.  Daughter reports this has been going on for the last year, worsened over the last couple months, followed closely by PCP and she is being referred to ENT.  Patient seems to comprehend everything without difficulty, follows commands, head nods for yes and no questions.     HPI  Past Medical History:  Diagnosis Date  . Arthritis    "all over"  . CAD (coronary artery disease)    a. NSTEMI 12/2013 - occluded dominant RCA with L-R collaterals, moderate prox segmental LAD disease, for medical therapy initially, EF 60% with subtle inferobasal hypokinesia. Consider PCI for refractory CP.  [name] CKD (chronic kidney disease), stage III   . Hypertension   . Migraines    "weekly" (01/06/2014)  . Sickle cell trait ([place])   . TIA (transient ischemic attack) 1978  . Type II diabetes mellitus ([place])   . Vertigo     Patient Active Problem List   Diagnosis Date Noted  . Aphasia 02/26/2019  . DM type 2 causing vascular disease ([place]) 06/11/2017  . Type 2 diabetes mellitus with stage 4 chronic kidney disease, with long-term current use of insulin ([place]) 06/11/2017  . Carotid stenosis 02/05/2017  . TIA (transient  ischemic attack) 01/20/2017  . Carotid stenosis, right 01/20/2017  . Diabetic hyperosmolar non-ketotic state ([place]) 01/19/2017  . AKI (acute kidney injury) ([place]) 01/19/2017  . Left leg weakness 01/19/2017  . Dyslipidemia 11/24/2016  . Edema 03/30/2016  . Diaphoresis 02/18/2016  . Hypoglycemia 02/18/2016  . Bradycardia 04/22/2015  . Coronary artery disease due to lipid rich plaque 02/26/2014  . PAF (paroxysmal atrial fibrillation) ([place]) 02/26/2014  . NSTEMI (non-ST elevated myocardial infarction) ([place]) 01/03/2014  . Occlusion and stenosis of carotid artery without mention of cerebral infarction 11/21/2013  . Pain in limb-Left neck 11/21/2013  . Carotid stenosis, bilateral 11/17/2011  . HYPERTHYROIDISM, SUBCLINICAL 02/15/2010  . GERD 02/15/2010  . DYSPHAGIA UNSPECIFIED 02/15/2010  . ABDOMINAL PAIN, UNSPECIFIED SITE 02/15/2010  . Controlled insulin-dependent diabetes mellitus with neuropathy 02/11/2010  . Essential hypertension, benign 02/11/2010  . HEADACHE 02/11/2010    Past Surgical History:  Procedure Laterality Date  . APPENDECTOMY  March 2014   had appendix frozen  . CARDIAC CATHETERIZATION  "years ago" & 01/05/2014  . CARDIOVERSION N/A 03/17/2014   Procedure: CARDIOVERSION;  Surgeon: [name], MD;  Location: [facility] ENDOSCOPY;  Service: Cardiovascular;  Laterality: N/A;  . CATARACT EXTRACTION W/ INTRAOCULAR LENS  IMPLANT, BILATERAL Bilateral 04/2013-05/2013  . ENDARTERECTOMY Right 02/05/2017   Procedure: ENDARTERECTOMY CAROTID-RIGHT;  Surgeon: [name], MD;  Location: [facility] OR;  Service: Vascular;  Laterality: Right;  . ESOPHAGOGASTRODUODENOSCOPY  11/08/2007    Normal  esophagus without evidence of Barrett, mass, erosion/ Normal stomach, duodenal bulb  . GASTRIC MOTILITY STUDY  11/13/2007   mildly delayed emptying subjectively, but normal  analysis-77% of tracer emptied at 2 hours  . JOINT REPLACEMENT    . LAPAROSCOPIC APPENDECTOMY N/A 09/15/2012   Procedure:  APPENDECTOMY LAPAROSCOPIC;  Surgeon: [name], MD;  Location: [facility] ORS;  Service: General;  Laterality: N/A;  . LEFT HEART CATHETERIZATION WITH CORONARY ANGIOGRAM N/A 01/05/2014   Procedure: LEFT HEART CATHETERIZATION WITH CORONARY ANGIOGRAM;  Surgeon: [name], MD;  Location: [facility] CATH LAB;  Service: Cardiovascular;  Laterality: N/A;  . REPLACEMENT TOTAL KNEE Right 05-03-06  . TEE WITHOUT CARDIOVERSION N/A 03/17/2014   Procedure: TRANSESOPHAGEAL ECHOCARDIOGRAM (TEE);  Surgeon: [name], MD;  Location: [facility] ENDOSCOPY;  Service: Cardiovascular;  Laterality: N/A;  . TONSILLECTOMY  1972     OB History    Gravida  2   Para  2   Term  2   Preterm      AB      Living  2     SAB      TAB      Ectopic      Multiple      Live Births               Home Medications    Prior to Admission medications   Medication Sig Start Date End Date Taking? Authorizing Provider  aspirin EC 81 MG tablet Take 81 mg by mouth daily.    [provider]  atorvastatin (LIPITOR) 80 MG tablet Take 1 tablet (80 mg total) by mouth every evening. 11/26/17   Hilty, Nadean Corwin, MD  carvedilol (COREG) 3.125 MG tablet Take 3.125 mg by mouth 2 (two) times daily. 12/23/18   [provider]  colesevelam (WELCHOL) 625 MG tablet Take 1,875 mg by mouth 2 (two) times daily with a meal. *May take 6 tablets once a day with meals*    [provider]  ELIQUIS 5 MG TABS tablet Take 1 tablet (5 mg total) by mouth 2 (two) times daily. 12/17/18   Hilty, Nadean Corwin, MD  furosemide (LASIX) 40 MG tablet Take 1 tablet by mouth 2 (two) times daily.  10/25/17   [provider]  glipiZIDE (GLUCOTROL XL) 5 MG 24 hr tablet Take 1 tablet (5 mg total) by mouth daily with breakfast. 03/26/19   Nida, Marella Chimes, MD  hydrALAZINE (APRESOLINE) 25 MG tablet Take 1 tablet (25 mg total) by mouth 3 (three) times daily. 02/27/19 02/27/20  Pokhrel, Corrie Mckusick, MD  Insulin Glargine (BASAGLAR KWIKPEN) 100  UNIT/ML SOPN Inject 40 Units into the skin at bedtime.    [provider]  insulin lispro (HUMALOG) 100 UNIT/ML injection Inject 10-16 Units into the skin 3 (three) times daily before meals.    [provider]  lisinopril (ZESTRIL) 20 MG tablet Take 20 mg by mouth daily.    [provider]  nitroGLYCERIN (NITROSTAT) 0.4 MG SL tablet Place 1 tablet (0.4 mg total) under the tongue every 5 (five) minutes as needed for chest pain (up to 3 doses). 11/26/17   Hilty, Nadean Corwin, MD  nystatin cream (MYCOSTATIN) Apply 1 application topically 2 (two) times a day.  11/05/17   [provider]  tiZANidine (ZANAFLEX) 4 MG tablet Take 4 mg by mouth at bedtime.  12/09/15   [provider]    Family History Family History  Problem Relation Age of Onset  . Hyperlipidemia Mother   .  Hypertension Mother   . Heart attack Mother   . Cancer Father        lung  . Hypertension Sister   . Diabetes Brother   . Heart disease Brother   . Hyperlipidemia Brother   . Hypertension Brother   . Heart attack Brother   . Other Brother        DVT    Social History Social History   Tobacco Use  . Smoking status: Never Smoker  . Smokeless tobacco: Never Used  Substance Use Topics  . Alcohol use: No  . Drug use: No     Allergies   Penicillins   Review of Systems Review of Systems  Constitutional: Negative for chills and fever.  HENT: Negative for ear pain and sore throat.   Eyes: Negative for pain and visual disturbance.  Respiratory: Negative for cough and shortness of breath.   Cardiovascular: Negative for chest pain and palpitations.  Gastrointestinal: Negative for abdominal pain and vomiting.  Genitourinary: Negative for dysuria and hematuria.  Musculoskeletal: Negative for arthralgias and back pain.  Skin: Negative for color change and rash.  Neurological: Positive for syncope. Negative for seizures.  All other systems reviewed and are negative.     Physical Exam Updated Vital Signs BP (!) 201/91 (BP Location: Right Arm)   Pulse 63   Temp 98.3 F (36.8 C) (Oral)   Resp 18   SpO2 100%   Physical Exam Vitals signs and nursing note reviewed.  Constitutional:      General: She is not in acute distress.    Appearance: She is well-developed.  HENT:     Head: Normocephalic.     Comments: Moderate swelling, echymosis over left orbit; normal full EOM Eyes:     Conjunctiva/sclera: Conjunctivae normal.     Pupils: Pupils are equal, round, and reactive to light.     Comments: No trauma to eye, vision grossly intact b/l  Neck:     Musculoskeletal: Neck supple.  Cardiovascular:     Rate and Rhythm: Normal rate and regular rhythm.     Heart sounds: No murmur.  Pulmonary:     Effort: Pulmonary effort is normal. No respiratory distress.     Breath sounds: Normal breath sounds.  Abdominal:     Palpations: Abdomen is soft.     Tenderness: There is no abdominal tenderness.  Musculoskeletal:        General: No swelling or tenderness.  Skin:    General: Skin is warm and dry.     Capillary Refill: Capillary refill takes less than 2 seconds.  Neurological:     General: No focal deficit present.     Mental Status: She is alert.      ED Treatments / Results  Labs (all labs ordered are listed, but only abnormal results are displayed) Labs Reviewed  CBG MONITORING, ED - Abnormal; Notable for the following components:      Result Value   Glucose-Capillary 147 (*)    All other components within normal limits  BASIC METABOLIC PANEL  CBC  URINALYSIS, ROUTINE W REFLEX MICROSCOPIC    EKG None  Radiology No results found.  Procedures Procedures (including critical care time)  Medications Ordered in ED Medications - No data to display   Initial Impression / Assessment and Plan / ED Course  I have reviewed the triage vital signs and the nursing notes.  Pertinent labs & imaging results that were available during my care of  the patient were reviewed  by me and considered in my medical decision making (see chart for details).  Clinical Course as of Apr 12 1255  Sat Apr 12, 2019  1220 Complete initial assessment, will get CT head, max face, check labs and EKG   [RD]  1247 No obvious fracture on max face, will follow up on final read  CT Maxillofacial Wo Contrast [RD]    Clinical Course User Index [RD] Lucrezia Starch, MD       73 year old presents after syncopal episode, head trauma.  CT head, max face negative.  No trauma to eye or vision changes.  No other significant trauma findings on exam, screening chest and pelvis x-rays were negative.  CBC, BMP within normal limits.  EKG without ischemic changes, no arrhythmias on telemetry monitoring.  Urine negative for infection.  Glucose normal here.  Etiology for syncope not clear at this time.  Believe she is appropriate for outpatient management though given above work-up.  Discussed importance of checking glucose before going to bed in the morning with daughter and patient.  Will defer insulin regimen management to primary doctor.    After the discussed management above, the patient was determined to be safe for discharge.  The patient was in agreement with this plan and all questions regarding their care were answered.  ED return precautions were discussed and the patient will return to the ED with any significant worsening of condition.       Final Clinical Impressions(s) / ED Diagnoses   Final diagnoses:  Trauma  Injury of head, initial encounter  Syncope and collapse  Abrasion    ED Discharge Orders    None       Lucrezia Starch, MD 04/12/19 1656

## 2019-04-12 NOTE — ED Triage Notes (Addendum)
Pt fell this morning (unsure of time but between 7:30-10:00). Pt hit  Head on dresser and c/o pain to forehead, swelling above L eye with abrasion. Pt denies other pain. Pt reports her bs this morning was 73. Pt is non-verbal, but is seeing ENT this week for this problem, not new for pt. Pt denies weakness but c/o feeling tired. Pt with equal strength bilaterally in upper and lower extremities.

## 2019-04-12 NOTE — ED Notes (Signed)
Purewick placed at this time.

## 2019-04-14 ENCOUNTER — Telehealth: Payer: Self-pay | Admitting: "Endocrinology

## 2019-04-14 DIAGNOSIS — R69 Illness, unspecified: Secondary | ICD-10-CM | POA: Diagnosis not present

## 2019-04-14 NOTE — Telephone Encounter (Signed)
FYI   Pts daughter states that Mrs Cassandra Adams is on 36 units of insulin at bedtime since going to the ER for a fall  this past weekend due to lower BG levels. He BG was 70 per pts daughter.  I advised her to call us back in couple of days with readings since she just started the 36 units last night.

## 2019-04-14 NOTE — Telephone Encounter (Signed)
Ok, she can further  lower lantus to 30 units qhs.

## 2019-04-14 NOTE — Telephone Encounter (Signed)
Pt.notified

## 2019-04-17 ENCOUNTER — Ambulatory Visit (INDEPENDENT_AMBULATORY_CARE_PROVIDER_SITE_OTHER): Payer: Medicare HMO | Admitting: Otolaryngology

## 2019-04-17 ENCOUNTER — Telehealth: Payer: Self-pay | Admitting: "Endocrinology

## 2019-04-17 DIAGNOSIS — R490 Dysphonia: Secondary | ICD-10-CM

## 2019-04-17 DIAGNOSIS — J382 Nodules of vocal cords: Secondary | ICD-10-CM | POA: Diagnosis not present

## 2019-04-17 NOTE — Telephone Encounter (Signed)
Lower her Humalog to 8 units TIDAC.

## 2019-04-17 NOTE — Telephone Encounter (Signed)
Pt's daughter notified.

## 2019-04-17 NOTE — Telephone Encounter (Signed)
Lft msg for pts daughter to call back for insulin instructions.

## 2019-04-17 NOTE — Telephone Encounter (Signed)
Pts daughter calling back with BG readings. Basaglar was lowered to 36 when she went to the ER this past weekend   Date Before breakfast Before lunch Before supper Bedtime  10/20 82 162 198 204  10/21 [phone] 313  10/22 7:50 266 Took 13 units 62            Pt taking: Basaglar 36 units qhs      Humalog 10-16 units tidac      Glipizide XL 5mg  qd

## 2019-04-18 DIAGNOSIS — Z6837 Body mass index (BMI) 37.0-37.9, adult: Secondary | ICD-10-CM | POA: Diagnosis not present

## 2019-04-18 DIAGNOSIS — S0083XD Contusion of other part of head, subsequent encounter: Secondary | ICD-10-CM | POA: Diagnosis not present

## 2019-04-18 DIAGNOSIS — W19XXXD Unspecified fall, subsequent encounter: Secondary | ICD-10-CM | POA: Diagnosis not present

## 2019-04-18 DIAGNOSIS — S0093XD Contusion of unspecified part of head, subsequent encounter: Secondary | ICD-10-CM | POA: Diagnosis not present

## 2019-05-04 ENCOUNTER — Encounter (HOSPITAL_COMMUNITY): Payer: Self-pay | Admitting: Emergency Medicine

## 2019-05-04 ENCOUNTER — Emergency Department (HOSPITAL_COMMUNITY): Payer: Medicare HMO

## 2019-05-04 ENCOUNTER — Inpatient Hospital Stay (HOSPITAL_COMMUNITY)
Admission: EM | Admit: 2019-05-04 | Discharge: 2019-05-12 | DRG: 552 | Disposition: A | Discharge status: Home Health | Payer: Medicare HMO | Attending: Family Medicine | Admitting: Family Medicine

## 2019-05-04 ENCOUNTER — Other Ambulatory Visit: Payer: Self-pay

## 2019-05-04 DIAGNOSIS — G5601 Carpal tunnel syndrome, right upper limb: Secondary | ICD-10-CM | POA: Diagnosis present

## 2019-05-04 DIAGNOSIS — I480 Paroxysmal atrial fibrillation: Secondary | ICD-10-CM | POA: Diagnosis present

## 2019-05-04 DIAGNOSIS — Z20828 Contact with and (suspected) exposure to other viral communicable diseases: Secondary | ICD-10-CM | POA: Diagnosis present

## 2019-05-04 DIAGNOSIS — Z8349 Family history of other endocrine, nutritional and metabolic diseases: Secondary | ICD-10-CM

## 2019-05-04 DIAGNOSIS — M5126 Other intervertebral disc displacement, lumbar region: Secondary | ICD-10-CM | POA: Diagnosis present

## 2019-05-04 DIAGNOSIS — I10 Essential (primary) hypertension: Secondary | ICD-10-CM | POA: Diagnosis not present

## 2019-05-04 DIAGNOSIS — Z8673 Personal history of transient ischemic attack (TIA), and cerebral infarction without residual deficits: Secondary | ICD-10-CM

## 2019-05-04 DIAGNOSIS — E1122 Type 2 diabetes mellitus with diabetic chronic kidney disease: Secondary | ICD-10-CM | POA: Diagnosis not present

## 2019-05-04 DIAGNOSIS — M1990 Unspecified osteoarthritis, unspecified site: Secondary | ICD-10-CM | POA: Diagnosis present

## 2019-05-04 DIAGNOSIS — E1165 Type 2 diabetes mellitus with hyperglycemia: Secondary | ICD-10-CM | POA: Diagnosis present

## 2019-05-04 DIAGNOSIS — Z7901 Long term (current) use of anticoagulants: Secondary | ICD-10-CM

## 2019-05-04 DIAGNOSIS — R000 Tachycardia, unspecified: Secondary | ICD-10-CM | POA: Diagnosis not present

## 2019-05-04 DIAGNOSIS — I639 Cerebral infarction, unspecified: Secondary | ICD-10-CM

## 2019-05-04 DIAGNOSIS — E1142 Type 2 diabetes mellitus with diabetic polyneuropathy: Secondary | ICD-10-CM | POA: Diagnosis present

## 2019-05-04 DIAGNOSIS — I6523 Occlusion and stenosis of bilateral carotid arteries: Secondary | ICD-10-CM | POA: Diagnosis present

## 2019-05-04 DIAGNOSIS — N184 Chronic kidney disease, stage 4 (severe): Secondary | ICD-10-CM | POA: Diagnosis present

## 2019-05-04 DIAGNOSIS — R531 Weakness: Secondary | ICD-10-CM

## 2019-05-04 DIAGNOSIS — Z880 Allergy status to penicillin: Secondary | ICD-10-CM

## 2019-05-04 DIAGNOSIS — I4891 Unspecified atrial fibrillation: Secondary | ICD-10-CM | POA: Diagnosis not present

## 2019-05-04 DIAGNOSIS — G8191 Hemiplegia, unspecified affecting right dominant side: Secondary | ICD-10-CM

## 2019-05-04 DIAGNOSIS — R519 Headache, unspecified: Secondary | ICD-10-CM | POA: Diagnosis present

## 2019-05-04 DIAGNOSIS — E161 Other hypoglycemia: Secondary | ICD-10-CM | POA: Diagnosis not present

## 2019-05-04 DIAGNOSIS — Z794 Long term (current) use of insulin: Secondary | ICD-10-CM

## 2019-05-04 DIAGNOSIS — I252 Old myocardial infarction: Secondary | ICD-10-CM

## 2019-05-04 DIAGNOSIS — M48061 Spinal stenosis, lumbar region without neurogenic claudication: Secondary | ICD-10-CM | POA: Diagnosis present

## 2019-05-04 DIAGNOSIS — N183 Chronic kidney disease, stage 3 unspecified: Secondary | ICD-10-CM

## 2019-05-04 DIAGNOSIS — M5106 Intervertebral disc disorders with myelopathy, lumbar region: Principal | ICD-10-CM | POA: Diagnosis present

## 2019-05-04 DIAGNOSIS — I129 Hypertensive chronic kidney disease with stage 1 through stage 4 chronic kidney disease, or unspecified chronic kidney disease: Secondary | ICD-10-CM | POA: Diagnosis present

## 2019-05-04 DIAGNOSIS — K219 Gastro-esophageal reflux disease without esophagitis: Secondary | ICD-10-CM | POA: Diagnosis present

## 2019-05-04 DIAGNOSIS — H8110 Benign paroxysmal vertigo, unspecified ear: Secondary | ICD-10-CM | POA: Diagnosis present

## 2019-05-04 DIAGNOSIS — N1831 Chronic kidney disease, stage 3a: Secondary | ICD-10-CM

## 2019-05-04 DIAGNOSIS — Y92230 Patient room in hospital as the place of occurrence of the external cause: Secondary | ICD-10-CM | POA: Diagnosis present

## 2019-05-04 DIAGNOSIS — R0689 Other abnormalities of breathing: Secondary | ICD-10-CM | POA: Diagnosis not present

## 2019-05-04 DIAGNOSIS — Z13220 Encounter for screening for lipoid disorders: Secondary | ICD-10-CM | POA: Diagnosis not present

## 2019-05-04 DIAGNOSIS — R200 Anesthesia of skin: Secondary | ICD-10-CM | POA: Diagnosis not present

## 2019-05-04 DIAGNOSIS — M5116 Intervertebral disc disorders with radiculopathy, lumbar region: Secondary | ICD-10-CM | POA: Diagnosis present

## 2019-05-04 DIAGNOSIS — G8929 Other chronic pain: Secondary | ICD-10-CM | POA: Diagnosis present

## 2019-05-04 DIAGNOSIS — T380X5A Adverse effect of glucocorticoids and synthetic analogues, initial encounter: Secondary | ICD-10-CM | POA: Diagnosis present

## 2019-05-04 DIAGNOSIS — I2510 Atherosclerotic heart disease of native coronary artery without angina pectoris: Secondary | ICD-10-CM | POA: Diagnosis present

## 2019-05-04 DIAGNOSIS — Z7982 Long term (current) use of aspirin: Secondary | ICD-10-CM

## 2019-05-04 DIAGNOSIS — Z79899 Other long term (current) drug therapy: Secondary | ICD-10-CM

## 2019-05-04 DIAGNOSIS — E785 Hyperlipidemia, unspecified: Secondary | ICD-10-CM | POA: Diagnosis not present

## 2019-05-04 DIAGNOSIS — N1832 Chronic kidney disease, stage 3b: Secondary | ICD-10-CM | POA: Diagnosis present

## 2019-05-04 DIAGNOSIS — E162 Hypoglycemia, unspecified: Secondary | ICD-10-CM | POA: Diagnosis not present

## 2019-05-04 DIAGNOSIS — R42 Dizziness and giddiness: Secondary | ICD-10-CM

## 2019-05-04 DIAGNOSIS — Z833 Family history of diabetes mellitus: Secondary | ICD-10-CM

## 2019-05-04 DIAGNOSIS — D573 Sickle-cell trait: Secondary | ICD-10-CM | POA: Diagnosis present

## 2019-05-04 DIAGNOSIS — Z8249 Family history of ischemic heart disease and other diseases of the circulatory system: Secondary | ICD-10-CM

## 2019-05-04 DIAGNOSIS — R29898 Other symptoms and signs involving the musculoskeletal system: Secondary | ICD-10-CM | POA: Diagnosis present

## 2019-05-04 DIAGNOSIS — R1310 Dysphagia, unspecified: Secondary | ICD-10-CM | POA: Diagnosis present

## 2019-05-04 DIAGNOSIS — R490 Dysphonia: Secondary | ICD-10-CM | POA: Diagnosis present

## 2019-05-04 DIAGNOSIS — R296 Repeated falls: Secondary | ICD-10-CM | POA: Diagnosis present

## 2019-05-04 DIAGNOSIS — G8311 Monoplegia of lower limb affecting right dominant side: Secondary | ICD-10-CM | POA: Diagnosis present

## 2019-05-04 LAB — PROTIME-INR
INR: 1.2 (ref 0.8–1.2)
Prothrombin Time: 15.2 seconds (ref 11.4–15.2)

## 2019-05-04 LAB — DIFFERENTIAL
Abs Immature Granulocytes: 0.05 10*3/uL (ref 0.00–0.07)
Basophils Absolute: 0 10*3/uL (ref 0.0–0.1)
Basophils Relative: 1 %
Eosinophils Absolute: 0 10*3/uL (ref 0.0–0.5)
Eosinophils Relative: 1 %
Immature Granulocytes: 1 %
Lymphocytes Relative: 20 %
Lymphs Abs: 1.1 10*3/uL (ref 0.7–4.0)
Monocytes Absolute: 0.4 10*3/uL (ref 0.1–1.0)
Monocytes Relative: 7 %
Neutro Abs: 4.1 10*3/uL (ref 1.7–7.7)
Neutrophils Relative %: 70 %

## 2019-05-04 LAB — CBC
HCT: 40.2 % (ref 36.0–46.0)
Hemoglobin: 13.5 g/dL (ref 12.0–15.0)
MCH: 28 pg (ref 26.0–34.0)
MCHC: 33.6 g/dL (ref 30.0–36.0)
MCV: 83.2 fL (ref 80.0–100.0)
Platelets: 191 K/uL (ref 150–400)
RBC: 4.83 MIL/uL (ref 3.87–5.11)
RDW: 13.4 % (ref 11.5–15.5)
WBC: 5.7 K/uL (ref 4.0–10.5)
nRBC: 0 % (ref 0.0–0.2)

## 2019-05-04 LAB — COMPREHENSIVE METABOLIC PANEL
ALT: 22 U/L (ref 0–44)
AST: 33 U/L (ref 15–41)
Albumin: 3.7 g/dL (ref 3.5–5.0)
Alkaline Phosphatase: 85 U/L (ref 38–126)
Anion gap: 5 (ref 5–15)
BUN: 22 mg/dL (ref 8–23)
CO2: 25 mmol/L (ref 22–32)
Calcium: 9.9 mg/dL (ref 8.9–10.3)
Chloride: 109 mmol/L (ref 98–111)
Creatinine, Ser: 1.13 mg/dL — ABNORMAL HIGH (ref 0.44–1.00)
GFR calc Af Amer: 55 mL/min — ABNORMAL LOW (ref >60)
GFR calc non Af Amer: 48 mL/min — ABNORMAL LOW (ref >60)
Glucose, Bld: 119 mg/dL — ABNORMAL HIGH (ref 70–99)
Potassium: 4.4 mmol/L (ref 3.5–5.1)
Sodium: 139 mmol/L (ref 135–145)
Total Bilirubin: 0.5 mg/dL (ref 0.3–1.2)
Total Protein: 7.4 g/dL (ref 6.5–8.1)

## 2019-05-04 LAB — CBG MONITORING, ED: Glucose-Capillary: 117 mg/dL — ABNORMAL HIGH (ref 70–99)

## 2019-05-04 LAB — APTT: aPTT: 38 s — ABNORMAL HIGH (ref 24–36)

## 2019-05-04 MED ORDER — BASAGLAR KWIKPEN 100 UNIT/ML ~~LOC~~ SOPN: 36.0000 [IU] | PEN_INJECTOR | Freq: Every day | SUBCUTANEOUS | Status: DC

## 2019-05-04 MED ORDER — ACETAMINOPHEN 650 MG RE SUPP: 650.0000 mg | RECTAL | Status: DC | PRN

## 2019-05-04 MED ORDER — SENNOSIDES-DOCUSATE SODIUM 8.6-50 MG PO TABS
1.0000 | ORAL_TABLET | Freq: Every evening | ORAL | Status: DC | PRN
  Filled 2019-05-04: qty 1

## 2019-05-04 MED ORDER — NITROGLYCERIN 0.4 MG SL SUBL: 0.4000 mg | SUBLINGUAL_TABLET | SUBLINGUAL | Status: DC | PRN

## 2019-05-04 MED ORDER — HYDRALAZINE HCL 25 MG PO TABS
25.0000 mg | ORAL_TABLET | Freq: Four times a day (QID) | ORAL | Status: DC | PRN
  Administered 2019-05-05 – 2019-05-06 (×3): 25 mg via ORAL
  Filled 2019-05-04 (×4): qty 1

## 2019-05-04 MED ORDER — ACETAMINOPHEN 500 MG PO TABS
1000.0000 mg | ORAL_TABLET | Freq: Once | ORAL | Status: AC
  Administered 2019-05-04: 1000 mg via ORAL
  Filled 2019-05-04: qty 2

## 2019-05-04 MED ORDER — ONDANSETRON HCL 4 MG/2ML IJ SOLN
4.0000 mg | Freq: Four times a day (QID) | INTRAMUSCULAR | Status: DC | PRN
  Administered 2019-05-10 – 2019-05-11 (×2): 4 mg via INTRAVENOUS
  Filled 2019-05-04 (×2): qty 2

## 2019-05-04 MED ORDER — ACETAMINOPHEN 160 MG/5ML PO SOLN: 650.0000 mg | ORAL | Status: DC | PRN

## 2019-05-04 MED ORDER — INSULIN GLARGINE 100 UNIT/ML ~~LOC~~ SOLN
36.0000 [IU] | Freq: Every day | SUBCUTANEOUS | Status: DC
  Administered 2019-05-05: 36 [IU] via SUBCUTANEOUS
  Filled 2019-05-04: qty 0.36

## 2019-05-04 MED ORDER — ACETAMINOPHEN 325 MG PO TABS
650.0000 mg | ORAL_TABLET | Freq: Four times a day (QID) | ORAL | Status: DC | PRN
  Administered 2019-05-05 – 2019-05-11 (×8): 650 mg via ORAL
  Filled 2019-05-04 (×8): qty 2

## 2019-05-04 MED ORDER — ACETAMINOPHEN 325 MG PO TABS: 650.0000 mg | ORAL_TABLET | ORAL | Status: DC | PRN

## 2019-05-04 MED ORDER — CARVEDILOL 3.125 MG PO TABS
3.1250 mg | ORAL_TABLET | Freq: Two times a day (BID) | ORAL | Status: DC
  Administered 2019-05-05 – 2019-05-06 (×3): 3.125 mg via ORAL
  Filled 2019-05-04 (×6): qty 1

## 2019-05-04 MED ORDER — ATORVASTATIN CALCIUM 40 MG PO TABS
80.0000 mg | ORAL_TABLET | Freq: Every evening | ORAL | Status: DC
  Administered 2019-05-05 – 2019-05-11 (×7): 80 mg via ORAL
  Filled 2019-05-04 (×8): qty 2

## 2019-05-04 MED ORDER — COLESEVELAM HCL 625 MG PO TABS
1875.0000 mg | ORAL_TABLET | Freq: Two times a day (BID) | ORAL | Status: DC
  Administered 2019-05-05 – 2019-05-12 (×14): 1875 mg via ORAL
  Filled 2019-05-04 (×20): qty 3

## 2019-05-04 MED ORDER — SODIUM CHLORIDE 0.9% FLUSH
3.0000 mL | Freq: Once | INTRAVENOUS | Status: AC
  Administered 2019-05-04: 16:00:00 3 mL via INTRAVENOUS

## 2019-05-04 MED ORDER — ASPIRIN EC 81 MG PO TBEC
81.0000 mg | DELAYED_RELEASE_TABLET | Freq: Every day | ORAL | Status: DC
  Administered 2019-05-05 – 2019-05-12 (×8): 81 mg via ORAL
  Filled 2019-05-04 (×8): qty 1

## 2019-05-04 MED ORDER — TIZANIDINE HCL 4 MG PO TABS
4.0000 mg | ORAL_TABLET | Freq: Every day | ORAL | Status: DC
  Administered 2019-05-05 – 2019-05-11 (×8): 4 mg via ORAL
  Filled 2019-05-04 (×9): qty 1

## 2019-05-04 MED ORDER — STROKE: EARLY STAGES OF RECOVERY BOOK
Freq: Once | Status: AC
  Administered 2019-05-05: 10:00:00
  Filled 2019-05-04: qty 1

## 2019-05-04 NOTE — ED Provider Notes (Signed)
[facility] EMERGENCY DEPARTMENT Provider Note   CSN: [id number] Arrival date & time: 05/04/19  1331     History   Chief Complaint Chief Complaint  Patient presents with  . Weakness    HPI Cassandra Adams is a 73 y.o. female.     Patient presenting with right leg weakness.  Patient also stating she has right arm weakness.  Patient states that everything was fine at 430 this morning.  She went back to bed got up around noon time and had significant right leg weakness.  She had pre-existing left leg weakness.  And pre-existing difficulty speaking with very whispered type speech.  Patient was admitted in September had a negative MRI.  Did have ear nose and throat follow-up with Dr. Lorelee Cover to look at the vocal cords.  He felt that structurally everything was normal but referred her to [facility] for further evaluation.  That evaluation is pending.  Patient denies any head pain any neck pain or back pain.  Past medical history significant for type 2 diabetes transischemic attacks coronary artery disease chronic kidney disease hypertension and a past history of migraines.  Patient's speech is just whispered she performs words fine.  The only thing that is new is the right leg and right arm weakness.  At the time of seeing patient last seen normal was about 12 hours ago.  In addition patient's had some numbness in her right hand.     Past Medical History:  Diagnosis Date  . Arthritis    "all over"  . CAD (coronary artery disease)    a. NSTEMI 12/2013 - occluded dominant RCA with L-R collaterals, moderate prox segmental LAD disease, for medical therapy initially, EF 60% with subtle inferobasal hypokinesia. Consider PCI for refractory CP.  [name] CKD (chronic kidney disease), stage III   . Hypertension   . Migraines    "weekly" (01/06/2014)  . Sickle cell trait ([place])   . TIA (transient ischemic attack) 1978  . Type II diabetes mellitus ([place])   . Vertigo     Patient Active Problem List   Diagnosis Date Noted  . Aphasia 02/26/2019  . DM type 2 causing vascular disease ([place]) 06/11/2017  . Type 2 diabetes mellitus with stage 4 chronic kidney disease, with long-term current use of insulin ([place]) 06/11/2017  . Carotid stenosis 02/05/2017  . TIA (transient ischemic attack) 01/20/2017  . Carotid stenosis, right 01/20/2017  . Diabetic hyperosmolar non-ketotic state ([place]) 01/19/2017  . AKI (acute kidney injury) ([place]) 01/19/2017  . Left leg weakness 01/19/2017  . Dyslipidemia 11/24/2016  . Edema 03/30/2016  . Diaphoresis 02/18/2016  . Hypoglycemia 02/18/2016  . Bradycardia 04/22/2015  . Coronary artery disease due to lipid rich plaque 02/26/2014  . PAF (paroxysmal atrial fibrillation) ([place]) 02/26/2014  . NSTEMI (non-ST elevated myocardial infarction) ([place]) 01/03/2014  . Occlusion and stenosis of carotid artery without mention of cerebral infarction 11/21/2013  . Pain in limb-Left neck 11/21/2013  . Carotid stenosis, bilateral 11/17/2011  . HYPERTHYROIDISM, SUBCLINICAL 02/15/2010  . GERD 02/15/2010  . DYSPHAGIA UNSPECIFIED 02/15/2010  . ABDOMINAL PAIN, UNSPECIFIED SITE 02/15/2010  . Controlled insulin-dependent diabetes mellitus with neuropathy 02/11/2010  . Essential hypertension, benign 02/11/2010  . HEADACHE 02/11/2010    Past Surgical History:  Procedure Laterality Date  . APPENDECTOMY  March 2014   had appendix frozen  . CARDIAC CATHETERIZATION  "years ago" & 01/05/2014  . CARDIOVERSION N/A 03/17/2014   Procedure: CARDIOVERSION;  Surgeon: [name], MD;  Location: [place];  Service: Cardiovascular;  Laterality: N/A;  . CATARACT EXTRACTION W/ INTRAOCULAR LENS  IMPLANT, BILATERAL Bilateral 04/2013-05/2013  . ENDARTERECTOMY Right 02/05/2017   Procedure: ENDARTERECTOMY CAROTID-RIGHT;  Surgeon: [name], MD;  Location: [facility] OR;  Service: Vascular;  Laterality: Right;  . ESOPHAGOGASTRODUODENOSCOPY  11/08/2007    Normal esophagus without evidence of  Barrett, mass, erosion/ Normal stomach, duodenal bulb  . GASTRIC MOTILITY STUDY  11/13/2007   mildly delayed emptying subjectively, but normal  analysis-77% of tracer emptied at 2 hours  . JOINT REPLACEMENT    . LAPAROSCOPIC APPENDECTOMY N/A 09/15/2012   Procedure: APPENDECTOMY LAPAROSCOPIC;  Surgeon: [name], MD;  Location: [facility];  Service: General;  Laterality: N/A;  . LEFT HEART CATHETERIZATION WITH CORONARY ANGIOGRAM N/A 01/05/2014   Procedure: LEFT HEART CATHETERIZATION WITH CORONARY ANGIOGRAM;  Surgeon: [name], MD;  Location: [facility] CATH LAB;  Service: Cardiovascular;  Laterality: N/A;  . REPLACEMENT TOTAL KNEE Right 05-03-06  . TEE WITHOUT CARDIOVERSION N/A 03/17/2014   Procedure: TRANSESOPHAGEAL ECHOCARDIOGRAM (TEE);  Surgeon: [name], MD;  Location: [facility] ENDOSCOPY;  Service: Cardiovascular;  Laterality: N/A;  . TONSILLECTOMY  1972     OB History    Gravida  2   Para  2   Term  2   Preterm      AB      Living  2     SAB      TAB      Ectopic      Multiple      Live Births               Home Medications    Prior to Admission medications   Medication Sig Start Date End Date Taking? Authorizing Provider  aspirin EC 81 MG tablet Take 81 mg by mouth daily.   Yes [provider]  atorvastatin (LIPITOR) 80 MG tablet Take 1 tablet (80 mg total) by mouth every evening. 11/26/17  Yes Hilty, Nadean Corwin, MD  carvedilol (COREG) 3.125 MG tablet Take 3.125 mg by mouth 2 (two) times daily. 12/23/18  Yes [provider]  colesevelam (WELCHOL) 625 MG tablet Take 1,875 mg by mouth 2 (two) times daily with a meal. *May take 6 tablets once a day with meals*   Yes [provider]  ELIQUIS 5 MG TABS tablet Take 1 tablet (5 mg total) by mouth 2 (two) times daily. 12/17/18  Yes Hilty, Nadean Corwin, MD  furosemide (LASIX) 40 MG tablet Take 40 mg by mouth 2 (two) times daily.  10/25/17  Yes [provider]  glipiZIDE (GLUCOTROL XL) 5 MG 24  hr tablet Take 1 tablet (5 mg total) by mouth daily with breakfast. 03/26/19  Yes Nida, Marella Chimes, MD  hydrALAZINE (APRESOLINE) 25 MG tablet Take 1 tablet (25 mg total) by mouth 3 (three) times daily. 02/27/19 02/27/20 Yes Pokhrel, Laxman, MD  Insulin Glargine (BASAGLAR KWIKPEN) 100 UNIT/ML SOPN Inject 36 Units into the skin at bedtime.    Yes [provider]  insulin lispro (HUMALOG) 100 UNIT/ML injection Inject 10 Units into the skin 3 (three) times daily before meals.    Yes [provider]  lisinopril (ZESTRIL) 10 MG tablet Take 10 mg by mouth daily.    Yes [provider]  nitroGLYCERIN (NITROSTAT) 0.4 MG SL tablet Place 1 tablet (0.4 mg total) under the tongue every 5 (five) minutes as needed for chest pain (up to 3 doses). 11/26/17  Yes Hilty, Nadean Corwin, MD  nystatin  cream (MYCOSTATIN) Apply 1 application topically 2 (two) times daily as needed for dry skin.  11/05/17  Yes [provider]  tiZANidine (ZANAFLEX) 4 MG tablet Take 4 mg by mouth at bedtime.  12/09/15  Yes [provider]    Family History Family History  Problem Relation Age of Onset  . Hyperlipidemia Mother   . Hypertension Mother   . Heart attack Mother   . Cancer Father        lung  . Hypertension Sister   . Diabetes Brother   . Heart disease Brother   . Hyperlipidemia Brother   . Hypertension Brother   . Heart attack Brother   . Other Brother        DVT    Social History Social History   Tobacco Use  . Smoking status: Never Smoker  . Smokeless tobacco: Never Used  Substance Use Topics  . Alcohol use: No  . Drug use: No     Allergies   Penicillins   Review of Systems Review of Systems  Constitutional: Negative for chills and fever.  HENT: Negative for congestion, rhinorrhea and sore throat.   Eyes: Negative for visual disturbance.  Respiratory: Negative for cough and shortness of breath.   Cardiovascular: Negative for chest pain and leg swelling.   Gastrointestinal: Negative for abdominal pain, diarrhea, nausea and vomiting.  Genitourinary: Negative for dysuria.  Musculoskeletal: Negative for back pain and neck pain.  Skin: Negative for rash.  Neurological: Positive for speech difficulty, weakness and numbness. Negative for dizziness, facial asymmetry, light-headedness and headaches.  Hematological: Does not bruise/bleed easily.  Psychiatric/Behavioral: Negative for confusion.     Physical Exam Updated Vital Signs BP (!) 193/69   Pulse 66   Temp 97.9 F (36.6 C) (Oral)   Resp (!) 25   Ht 1.702 m (5\' 7" )   Wt 90.7 kg   SpO2 99%   BMI 31.32 kg/m   Physical Exam Vitals signs and nursing note reviewed.  Constitutional:      General: She is not in acute distress.    Appearance: Normal appearance. She is well-developed.  HENT:     Head: Normocephalic and atraumatic.  Eyes:     Conjunctiva/sclera: Conjunctivae normal.     Pupils: Pupils are equal, round, and reactive to light.  Neck:     Musculoskeletal: Normal range of motion and neck supple.     Comments: No tenderness to palpation to the neck or any point or any point tenderness posteriorly. Cardiovascular:     Rate and Rhythm: Normal rate and regular rhythm.     Heart sounds: No murmur.  Pulmonary:     Effort: Pulmonary effort is normal. No respiratory distress.     Breath sounds: Normal breath sounds.  Abdominal:     General: Bowel sounds are normal.     Palpations: Abdomen is soft.     Tenderness: There is no abdominal tenderness.  Musculoskeletal:        General: No tenderness.     Comments: No tenderness to palpation to the back area.  Dorsalis pedis pulse bilaterally 1+.  Radial pulses are 2+ bilaterally.  Skin:    General: Skin is warm and dry.  Neurological:     Mental Status: She is alert and oriented to person, place, and time.     Cranial Nerves: No cranial nerve deficit.     Sensory: Sensory deficit present.     Motor: Weakness present.      Comments: Patient  with weakness to the left lower extremity which patient states is baseline.  Patient with significant weakness to the right lower extremity.  No evidence of any weakness to the right upper extremity.  Subjective numbness to the hand.  Patient with excellent grip strength good movement at the wrist good movement at the elbow shoulder.  Able to hold the arm up against gravity for a long period of time.       ED Treatments / Results  Labs (all labs ordered are listed, but only abnormal results are displayed) Labs Reviewed  APTT - Abnormal; Notable for the following components:      Result Value   aPTT 38 (*)    All other components within normal limits  COMPREHENSIVE METABOLIC PANEL - Abnormal; Notable for the following components:   Glucose, Bld 119 (*)    Creatinine, Ser 1.13 (*)    GFR calc non Af Amer 48 (*)    GFR calc Af Amer 55 (*)    All other components within normal limits  CBG MONITORING, ED - Abnormal; Notable for the following components:   Glucose-Capillary 117 (*)    All other components within normal limits  PROTIME-INR  CBC  DIFFERENTIAL    EKG EKG Interpretation  Date/Time:  Sunday May 04 2019 14:08:44 EST Ventricular Rate:  127 PR Interval:    QRS Duration: 98 QT Interval:  302 QTC Calculation: 438 R Axis:   -7 Text Interpretation: Atrial fibrillation with rapid ventricular response with premature ventricular or aberrantly conducted complexes Inferior infarct , age undetermined Marked ST abnormality, possible lateral subendocardial injury Abnormal ECG Confirmed by Fredia Sorrow [phone]) on 05/04/2019 4:06:02 PM   Radiology Ct Head Wo Contrast  Result Date: 05/04/2019 CLINICAL DATA:  Possible stroke, weakness, right-sided weakness and numbness EXAM: CT HEAD WITHOUT CONTRAST TECHNIQUE: Contiguous axial images were obtained from the base of the skull through the vertex without intravenous contrast. COMPARISON:  04/12/2019 FINDINGS:  Brain: No evidence of acute infarction, hemorrhage, hydrocephalus, extra-axial collection or mass lesion/mass effect. Vascular: No hyperdense vessel or unexpected calcification. Skull: Normal. Negative for fracture or focal lesion. Sinuses/Orbits: No acute finding. Other: There is a soft tissue contusion of the left forehead, which is improved compared to examination dated 04/12/2019. IMPRESSION: 1.  No acute intracranial pathology. 2. There is a soft tissue contusion of the left forehead, which is improved compared to examination dated 04/12/2019. Electronically Signed   By: Eddie Candle M.D.   On: 05/04/2019 16:35    Procedures Procedures (including critical care time)  Medications Ordered in ED Medications  sodium chloride flush (NS) 0.9 % injection 3 mL (3 mLs Intravenous Given 05/04/19 1554)     Initial Impression / Assessment and Plan / ED Course  I have reviewed the triage vital signs and the nursing notes.  Pertinent labs & imaging results that were available during my care of the patient were reviewed by me and considered in my medical decision making (see chart for details).       Discussed with Dr. Cheral Marker on call neuro hospitalist at [facility].  I feels patient is fine to stay here and have MRI in the morning.  He is recommending MRI brain MRI neck.  Thinks there could be a functional component.  Through to further chart review and discussion with family members patient did follow-up with ear nose and throat about the voice whispering.  Was seen by Dr. Lorelee Cover ear nose and throat.  Found no anatomical abnormalities and  sent patient for further studies to [facility].  Which is upcoming.  Also the left leg weakness has been present since her visit in last admission in September.  The new weakness is the right lower extremity weakness.  Patient states it feels as if there is some right upper extremity weakness but she has got good strength there.  Able to hold the arm up for extended  period of time against gravity.  Dr. Cheral Marker also suggested there could be a functional component as well.  We will rediscussed with the hospitalist for admission here.  Final Clinical Impressions(s) / ED Diagnoses   Final diagnoses:  Cerebrovascular accident (CVA), unspecified mechanism ([place])  Atrial fibrillation with [facility])  Essential hypertension    ED Discharge Orders    None       Fredia Sorrow, MD 05/04/19 2103

## 2019-05-04 NOTE — H&P (Addendum)
History and Physical    Cassandra Adams TSV:[id number] DOB: Apr 24, 1946 DOA: 05/04/2019  PCP: Redmond School, MD  Patient coming from: Home  I have personally briefly reviewed patient's old medical records in [place]  Chief Complaint: Right-sided weakness  HPI: Cassandra Adams is a 73 y.o. female with medical history significant of hypertension, hyperlipidemia, diabetes mellitus type 2, paroxysmal atrial fibrillation, coronary artery disease, history of TIA who presented to the ER with right-sided weakness.  Patient states she was doing well until around noon on 05/04/2019.  She says she had woken up at 4:30 in the morning when she was fine and had gone to the bathroom and then went back to sleep.  When she woke up at around noon she noted she had significant right-sided weakness mainly in the right lower extremity.  She did complain of some right upper extremity weakness as well. She also reports having decreased sensation on the right upper and lower extremity. She does have difficulty with speaking and has a very hoarse and soft voice and only is able to speak in whispers.  She was seen by ENT as an outpatient by Dr. Lorelee Cover and apparently had a laryngoscopy, reports not available to the patient says there may have been some issue with vocal cords.  She was referred to a specialist in [place] as per her.  She does have feeling of dysphagia with solid foods feeling that they get stuck in throat occasionally.  The symptoms are present since prior admission. Review of records shows patient was admitted from 02/26/2019-02/27/2019 with difficulty speaking, left-sided weakness and had an MRI of her brain at the time to rule out stroke which was noted to be negative.  She was thought to have possible TIA as well as concern for vocal cord dysfunction at the time. ED Course:  Vital Signs reviewed on presentation, significant for temperature 97.9, heart rate 72, blood pressure 195/84, saturation 97%  on room air. Labs reviewed, significant for sodium 139, potassium 4.4, BUN 22, creatinine 1.1, LFTs within normal limits, WBC count 5.7, hemoglobin 13.5, hematocrit 40.2, platelets 191, PTT 38, glucose 119, SARS-CoV-2 RT-PCR has been sent. Imaging personally Reviewed, CT of the head shows no acute intracranial pathology. EKG personally reviewed, shows atrial fibrillation with rapid regular response  Review of Systems: As per HPI otherwise 10 point review of systems negative.  All other review of systems is negative except the ones noted above in the HPI.  Past Medical History:  Diagnosis Date  . Arthritis    "all over"  . CAD (coronary artery disease)    a. NSTEMI 12/2013 - occluded dominant RCA with L-R collaterals, moderate prox segmental LAD disease, for medical therapy initially, EF 60% with subtle inferobasal hypokinesia. Consider PCI for refractory CP.  [name] CKD (chronic kidney disease), stage III   . Hypertension   . Migraines    "weekly" (01/06/2014)  . Sickle cell trait ([place])   . TIA (transient ischemic attack) 1978  . Type II diabetes mellitus ([place])   . Vertigo     Past Surgical History:  Procedure Laterality Date  . APPENDECTOMY  March 2014   had appendix frozen  . CARDIAC CATHETERIZATION  "years ago" & 01/05/2014  . CARDIOVERSION N/A 03/17/2014   Procedure: CARDIOVERSION;  Surgeon: [name], MD;  Location: [facility] ENDOSCOPY;  Service: Cardiovascular;  Laterality: N/A;  . CATARACT EXTRACTION W/ INTRAOCULAR LENS  IMPLANT, BILATERAL Bilateral 04/2013-05/2013  . ENDARTERECTOMY Right 02/05/2017   Procedure: ENDARTERECTOMY  CAROTID-RIGHT;  Surgeon: [name], MD;  Location[facility] OR;  Service: Vascular;  Laterality: Right;  . ESOPHAGOGASTRODUODENOSCOPY  11/08/2007    Normal esophagus without evidence of Barrett, mass, erosion/ Normal stomach, duodenal bulb  . GASTRIC MOTILITY STUDY  11/13/2007   mildly delayed emptying subjectively, but normal  analysis-77% of tracer emptied  at 2 hours  . JOINT REPLACEMENT    . LAPAROSCOPIC APPENDECTOMY N/A 09/15/2012   Procedure: APPENDECTOMY LAPAROSCOPIC;  Surgeon: [name], MD;  Location: [facility] ORS;  Service: General;  Laterality: N/A;  . LEFT HEART CATHETERIZATION WITH CORONARY ANGIOGRAM N/A 01/05/2014   Procedure: LEFT HEART CATHETERIZATION WITH CORONARY ANGIOGRAM;  Surgeon: [name], MD;  Location: [facility] CATH LAB;  Service: Cardiovascular;  Laterality: N/A;  . REPLACEMENT TOTAL KNEE Right 05-03-06  . TEE WITHOUT CARDIOVERSION N/A 03/17/2014   Procedure: TRANSESOPHAGEAL ECHOCARDIOGRAM (TEE);  Surgeon: [name], MD;  Location: [facility] ENDOSCOPY;  Service: Cardiovascular;  Laterality: N/A;  . TONSILLECTOMY  1972     reports that she has never smoked. She has never used smokeless tobacco. She reports that she does not drink alcohol or use drugs.  Allergies  Allergen Reactions  . Penicillins Hives    Has patient had a PCN reaction causing immediate rash, facial/tongue/throat swelling, SOB or lightheadedness with hypotension: no Has patient had a PCN reaction causing severe rash involving mucus membranes or skin necrosis: No  Has patient had a PCN reaction that required hospitalization: no Has patient had a PCN reaction occurring within the last 10 years: no If all of the above answers are "NO", then may proceed with Cephalosporin use.     Family History  Problem Relation Age of Onset  . Hyperlipidemia Mother   . Hypertension Mother   . Heart attack Mother   . Cancer Father        lung  . Hypertension Sister   . Diabetes Brother   . Heart disease Brother   . Hyperlipidemia Brother   . Hypertension Brother   . Heart attack Brother   . Other Brother        DVT   Family history reviewed, noted as above, not pertinent to current presentation.   Prior to Admission medications   Medication Sig Start Date End Date Taking? Authorizing Provider  aspirin EC 81 MG tablet Take 81 mg by mouth daily.   Yes [provider]  atorvastatin (LIPITOR) 80 MG tablet Take 1 tablet (80 mg total) by mouth every evening. 11/26/17  Yes Hilty, Nadean Corwin, MD  carvedilol (COREG) 3.125 MG tablet Take 3.125 mg by mouth 2 (two) times daily. 12/23/18  Yes [provider]  colesevelam (WELCHOL) 625 MG tablet Take 1,875 mg by mouth 2 (two) times daily with a meal. *May take 6 tablets once a day with meals*   Yes [provider]  ELIQUIS 5 MG TABS tablet Take 1 tablet (5 mg total) by mouth 2 (two) times daily. 12/17/18  Yes Hilty, Nadean Corwin, MD  furosemide (LASIX) 40 MG tablet Take 40 mg by mouth 2 (two) times daily.  10/25/17  Yes [provider]  glipiZIDE (GLUCOTROL XL) 5 MG 24 hr tablet Take 1 tablet (5 mg total) by mouth daily with breakfast. 03/26/19  Yes Nida, Marella Chimes, MD  hydrALAZINE (APRESOLINE) 25 MG tablet Take 1 tablet (25 mg total) by mouth 3 (three) times daily. 02/27/19 02/27/20 Yes Pokhrel, Laxman, MD  Insulin Glargine (BASAGLAR KWIKPEN) 100 UNIT/ML SOPN Inject 36 Units  into the skin at bedtime.    Yes [provider]  insulin lispro (HUMALOG) 100 UNIT/ML injection Inject 10 Units into the skin 3 (three) times daily before meals.    Yes [provider]  lisinopril (ZESTRIL) 10 MG tablet Take 10 mg by mouth daily.    Yes [provider]  nitroGLYCERIN (NITROSTAT) 0.4 MG SL tablet Place 1 tablet (0.4 mg total) under the tongue every 5 (five) minutes as needed for chest pain (up to 3 doses). 11/26/17  Yes Hilty, Nadean Corwin, MD  nystatin cream (MYCOSTATIN) Apply 1 application topically 2 (two) times daily as needed for dry skin.  11/05/17  Yes [provider]  tiZANidine (ZANAFLEX) 4 MG tablet Take 4 mg by mouth at bedtime.  12/09/15  Yes [provider]    Physical Exam: Vitals:   05/04/19 2145 05/04/19 2200 05/04/19 2215 05/04/19 2230  BP:  (!) 161/66  (!) 195/84  Pulse: (!) 59 60 (!) 58 72  Resp: 18 20 20 20   Temp:      TempSrc:      SpO2:  99% 99% 99% 97%  Weight:      Height:        Constitutional: NAD, calm, comfortable Vitals:   05/04/19 2145 05/04/19 2200 05/04/19 2215 05/04/19 2230  BP:  (!) 161/66  (!) 195/84  Pulse: (!) 59 60 (!) 58 72  Resp: 18 20 20 20   Temp:      TempSrc:      SpO2: 99% 99% 99% 97%  Weight:      Height:       Eyes: PERRL, lids and conjunctivae normal ENMT: Mucous membranes are moist. Posterior pharynx clear of any exudate or lesions.Normal dentition.  Patient has hoarse voice, can only speak in a whisper. Neck: normal, supple, no masses, no thyromegaly Respiratory: clear to auscultation bilaterally, no wheezing, no crackles. Normal respiratory effort. No accessory muscle use.  Cardiovascular: Irregular rhythm, normal rate, no murmurs / rubs / gallops. No extremity edema. 2+ pedal pulses. No carotid bruits.  Abdomen: no tenderness, no masses palpated. No hepatosplenomegaly. Bowel sounds positive.  Musculoskeletal: no clubbing / cyanosis. No joint deformity upper and lower extremities.  Skin: no rashes, lesions, ulcers. No induration Neurologic: Patient is alert and oriented, is able to answer questions but can only speak in a whisper.  Power is 4/5 in right upper extremity, 2-3/5 on right lower extremity.  Patient is able to move the right lower extremity involuntarily but cannot do it on command.  Even on involuntary movements, does not seem to have good strength but is only able to move it minimally.  Left upper and left lower extremity seem to be having normal power.  Decreased sensation of the right upper and right lower extremity mainly up to the elbow on the upper and the knee on the lower.  Peripheral pulses are intact.  Reflexes are hard to elicit.  There does seem to be increased tone over the right distal muscle groups. Psychiatric: Normal judgment and insight. Alert and oriented x 3. Normal mood.    Decubitus Ulcers: Not present on admission Catheters and tubes: None   Labs on  Admission: I have personally reviewed following labs and imaging studies  CBC: Recent Labs  Lab 05/04/19 1709  WBC 5.7  NEUTROABS 4.1  HGB 13.5  HCT 40.2  MCV 83.2  PLT 226   Basic Metabolic Panel: Recent Labs  Lab 05/04/19 1709  NA 139  K 4.4  CL 109  CO2 25  GLUCOSE 119*  BUN 22  CREATININE 1.13*  CALCIUM 9.9   GFR: Estimated Creatinine Clearance: 50.5 mL/min (A) (by C-G formula based on SCr of 1.13 mg/dL (H)). Liver Function Tests: Recent Labs  Lab 05/04/19 1709  AST 33  ALT 22  ALKPHOS 85  BILITOT 0.5  PROT 7.4  ALBUMIN 3.7   No results for input(s): LIPASE, AMYLASE in the last 168 hours. No results for input(s): AMMONIA in the last 168 hours. Coagulation Profile: Recent Labs  Lab 05/04/19 1709  INR 1.2   Cardiac Enzymes: No results for input(s): CKTOTAL, CKMB, CKMBINDEX, TROPONINI in the last 168 hours. BNP (last 3 results) No results for input(s): PROBNP in the last 8760 hours. HbA1C: No results for input(s): HGBA1C in the last 72 hours. CBG: Recent Labs  Lab 05/04/19 1656  GLUCAP 117*   Lipid Profile: No results for input(s): CHOL, HDL, LDLCALC, TRIG, CHOLHDL, LDLDIRECT in the last 72 hours. Thyroid Function Tests: No results for input(s): TSH, T4TOTAL, FREET4, T3FREE, THYROIDAB in the last 72 hours. Anemia Panel: No results for input(s): VITAMINB12, FOLATE, FERRITIN, TIBC, IRON, RETICCTPCT in the last 72 hours. Urine analysis:    Component Value Date/Time   COLORURINE STRAW (A) 04/12/2019 1143   APPEARANCEUR CLEAR 04/12/2019 1143   LABSPEC 1.009 04/12/2019 1143   PHURINE 5.0 04/12/2019 1143   GLUCOSEU 150 (A) 04/12/2019 1143   HGBUR SMALL (A) 04/12/2019 1143   BILIRUBINUR NEGATIVE 04/12/2019 1143   KETONESUR 5 (A) 04/12/2019 1143   PROTEINUR 30 (A) 04/12/2019 1143   UROBILINOGEN 0.2 09/15/2012 1010   NITRITE NEGATIVE 04/12/2019 1143   LEUKOCYTESUR TRACE (A) 04/12/2019 1143    Radiological Exams on Admission: Ct Head Wo  Contrast  Result Date: 05/04/2019 CLINICAL DATA:  Possible stroke, weakness, right-sided weakness and numbness EXAM: CT HEAD WITHOUT CONTRAST TECHNIQUE: Contiguous axial images were obtained from the base of the skull through the vertex without intravenous contrast. COMPARISON:  04/12/2019 FINDINGS: Brain: No evidence of acute infarction, hemorrhage, hydrocephalus, extra-axial collection or mass lesion/mass effect. Vascular: No hyperdense vessel or unexpected calcification. Skull: Normal. Negative for fracture or focal lesion. Sinuses/Orbits: No acute finding. Other: There is a soft tissue contusion of the left forehead, which is improved compared to examination dated 04/12/2019. IMPRESSION: 1.  No acute intracranial pathology. 2. There is a soft tissue contusion of the left forehead, which is improved compared to examination dated 04/12/2019. Electronically Signed   By: Eddie Candle M.D.   On: 05/04/2019 16:35      Assessment/Plan Active Problems:   Essential hypertension, benign   GERD   Carotid stenosis, bilateral   PAF (paroxysmal atrial fibrillation) (HCC)   Dyslipidemia   Left leg weakness   Type 2 diabetes mellitus with stage 4 chronic kidney disease, with long-term current use of insulin (HCC)   Left-sided weakness     Principal Problem: Right-sided weakness Patient presented with a right-sided weakness as well as sensory loss.  Last well-known time was 4:30 AM on 05/04/2019. No cranial nerve deficits. No visual symptoms, no facial deviation or sensory loss on face.  No cerebellar signs on physical exam. Patient does not have any neck pain or back pain.  No bowel bladder abnormalities.  No seizure activity reported.  Patient is certainly outside window for TPA. NIHSS on presentation was 6. Case was discussed by ED physician with Dr. Cheral Marker the on-call neuro hospitalist at [facility].  He did not believe the patient required any urgent  intervention, urgent need for MRI or need for transfer to  the [facility] in [place].  Differential diagnosis would include a left MCA territory infarct versus cervical spinal disease.  Other considerations would include autoimmune disease such as multiple sclerosis or a functional etiology.  Review of records shows patient was admitted from 02/26/2019-02/27/2019 with difficulty speaking, left-sided weakness and had an MRI of her brain at the time to rule out stroke which was noted to be negative.  She was thought to have possible TIA as well as concern for vocal cord dysfunction at the time. Plan: Telemetry monitoring We will get MRI of the brain and cervical spine Neurology consult in a.m., consult has been placed with Dr. Merlene Laughter, will need to be called in the morning Echocardiogram in a.m. We will keep n.p.o. Aspiration/seizure/fall precautions PT/OT/speech evaluation We will send TSH, B12, ANA, ESR, CRP  Other Active Problems: Hoarseness of voice: Patient has history of hoarseness of voice persisting since September 2020.  She is only able to speak in a soft whisper. She was seen by ENT as an outpatient by Dr. Lorelee Cover and apparently had a laryngoscopy, reports not available but the patient says there may have been some issue with vocal cords.  She was referred to a specialist in [place] as per her.  Will need follow-up with ENT  Coronary artery disease: History of NSTEMI in 2015, no stent placements as per patient. Continue aspirin  Essential hypertension: We will continue Coreg with parameters. Hold lisinopril, hydralazine to allow for permissive hypertension  Hyperlipidemia: Continue Lipitor  Diabetes mellitus type 2: Uncontrolled, most recent A1c was 9.0 on 02/26/2019 Hold oral hypoglycemics.  Hold Lantus until patient is able to take p.o. Sliding scale insulin coverage with fingerstick monitoring  Paroxysmal atrial fibrillation: Most recent echocardiogram from 01/19/2017 showed EF of 55 to 60%, moderate LV hypertrophy, grade 2 diastolic  dysfunction. Continue Coreg for rate control Continue Eliquis   DVT prophylaxis: Eliquis Code Status:  Full code Family Communication: N/A  Disposition Plan: Placed in observation for further evaluation of right-sided weakness, may be needed to convert to inpatient if any abnormalities noted on imaging. Consults called: Neurology, Dr. Merlene Laughter, order placed, will need to be called in the morning Admission status: Observation   Lynetta Mare MD [facility]  If 7PM-7AM, please contact night-coverage   05/04/2019, 11:11 PM

## 2019-05-04 NOTE — ED Triage Notes (Signed)
Per EMS pt is feeling generally weak, pt only able to whisper. Pt reports right side weakness, right hand numbness. Says she woke up around 4:30 am and felt fine, went back to bed, woke up around noon and called doctor because she wasn't feeling well. Pt alert and oriented. EMS CBG 79. MD in triage to see pt.

## 2019-05-04 NOTE — ED Triage Notes (Signed)
Daughter contacted and updated on pt status.

## 2019-05-05 ENCOUNTER — Encounter (HOSPITAL_COMMUNITY): Payer: Self-pay

## 2019-05-05 ENCOUNTER — Inpatient Hospital Stay (HOSPITAL_COMMUNITY): Payer: Medicare HMO

## 2019-05-05 ENCOUNTER — Observation Stay (HOSPITAL_COMMUNITY): Payer: Medicare HMO

## 2019-05-05 DIAGNOSIS — I2510 Atherosclerotic heart disease of native coronary artery without angina pectoris: Secondary | ICD-10-CM | POA: Diagnosis not present

## 2019-05-05 DIAGNOSIS — N183 Chronic kidney disease, stage 3 unspecified: Secondary | ICD-10-CM | POA: Diagnosis not present

## 2019-05-05 DIAGNOSIS — Y92230 Patient room in hospital as the place of occurrence of the external cause: Secondary | ICD-10-CM | POA: Diagnosis not present

## 2019-05-05 DIAGNOSIS — N1832 Chronic kidney disease, stage 3b: Secondary | ICD-10-CM | POA: Diagnosis present

## 2019-05-05 DIAGNOSIS — R296 Repeated falls: Secondary | ICD-10-CM | POA: Diagnosis present

## 2019-05-05 DIAGNOSIS — E1142 Type 2 diabetes mellitus with diabetic polyneuropathy: Secondary | ICD-10-CM | POA: Diagnosis present

## 2019-05-05 DIAGNOSIS — G8191 Hemiplegia, unspecified affecting right dominant side: Secondary | ICD-10-CM

## 2019-05-05 DIAGNOSIS — M5126 Other intervertebral disc displacement, lumbar region: Secondary | ICD-10-CM | POA: Diagnosis present

## 2019-05-05 DIAGNOSIS — R519 Headache, unspecified: Secondary | ICD-10-CM | POA: Diagnosis not present

## 2019-05-05 DIAGNOSIS — N184 Chronic kidney disease, stage 4 (severe): Secondary | ICD-10-CM | POA: Diagnosis not present

## 2019-05-05 DIAGNOSIS — I480 Paroxysmal atrial fibrillation: Secondary | ICD-10-CM | POA: Diagnosis not present

## 2019-05-05 DIAGNOSIS — R471 Dysarthria and anarthria: Secondary | ICD-10-CM | POA: Diagnosis not present

## 2019-05-05 DIAGNOSIS — I6523 Occlusion and stenosis of bilateral carotid arteries: Secondary | ICD-10-CM | POA: Diagnosis not present

## 2019-05-05 DIAGNOSIS — G8929 Other chronic pain: Secondary | ICD-10-CM | POA: Diagnosis not present

## 2019-05-05 DIAGNOSIS — M50123 Cervical disc disorder at C6-C7 level with radiculopathy: Secondary | ICD-10-CM | POA: Diagnosis not present

## 2019-05-05 DIAGNOSIS — I10 Essential (primary) hypertension: Secondary | ICD-10-CM | POA: Diagnosis not present

## 2019-05-05 DIAGNOSIS — M546 Pain in thoracic spine: Secondary | ICD-10-CM | POA: Diagnosis not present

## 2019-05-05 DIAGNOSIS — E1165 Type 2 diabetes mellitus with hyperglycemia: Secondary | ICD-10-CM | POA: Diagnosis present

## 2019-05-05 DIAGNOSIS — M48061 Spinal stenosis, lumbar region without neurogenic claudication: Secondary | ICD-10-CM | POA: Diagnosis not present

## 2019-05-05 DIAGNOSIS — M5116 Intervertebral disc disorders with radiculopathy, lumbar region: Secondary | ICD-10-CM | POA: Diagnosis not present

## 2019-05-05 DIAGNOSIS — G8311 Monoplegia of lower limb affecting right dominant side: Secondary | ICD-10-CM | POA: Diagnosis not present

## 2019-05-05 DIAGNOSIS — R29898 Other symptoms and signs involving the musculoskeletal system: Secondary | ICD-10-CM | POA: Diagnosis not present

## 2019-05-05 DIAGNOSIS — K219 Gastro-esophageal reflux disease without esophagitis: Secondary | ICD-10-CM | POA: Diagnosis present

## 2019-05-05 DIAGNOSIS — G5601 Carpal tunnel syndrome, right upper limb: Secondary | ICD-10-CM | POA: Diagnosis present

## 2019-05-05 DIAGNOSIS — M5106 Intervertebral disc disorders with myelopathy, lumbar region: Secondary | ICD-10-CM | POA: Diagnosis not present

## 2019-05-05 DIAGNOSIS — R1310 Dysphagia, unspecified: Secondary | ICD-10-CM | POA: Diagnosis present

## 2019-05-05 DIAGNOSIS — E785 Hyperlipidemia, unspecified: Secondary | ICD-10-CM | POA: Diagnosis not present

## 2019-05-05 DIAGNOSIS — I129 Hypertensive chronic kidney disease with stage 1 through stage 4 chronic kidney disease, or unspecified chronic kidney disease: Secondary | ICD-10-CM | POA: Diagnosis not present

## 2019-05-05 DIAGNOSIS — M4802 Spinal stenosis, cervical region: Secondary | ICD-10-CM | POA: Diagnosis not present

## 2019-05-05 DIAGNOSIS — I4891 Unspecified atrial fibrillation: Secondary | ICD-10-CM | POA: Diagnosis not present

## 2019-05-05 DIAGNOSIS — M1990 Unspecified osteoarthritis, unspecified site: Secondary | ICD-10-CM | POA: Diagnosis present

## 2019-05-05 DIAGNOSIS — R531 Weakness: Secondary | ICD-10-CM | POA: Diagnosis not present

## 2019-05-05 DIAGNOSIS — D573 Sickle-cell trait: Secondary | ICD-10-CM | POA: Diagnosis present

## 2019-05-05 DIAGNOSIS — E1122 Type 2 diabetes mellitus with diabetic chronic kidney disease: Secondary | ICD-10-CM | POA: Diagnosis present

## 2019-05-05 DIAGNOSIS — I340 Nonrheumatic mitral (valve) insufficiency: Secondary | ICD-10-CM

## 2019-05-05 DIAGNOSIS — T380X5A Adverse effect of glucocorticoids and synthetic analogues, initial encounter: Secondary | ICD-10-CM | POA: Diagnosis present

## 2019-05-05 DIAGNOSIS — Z20828 Contact with and (suspected) exposure to other viral communicable diseases: Secondary | ICD-10-CM | POA: Diagnosis not present

## 2019-05-05 DIAGNOSIS — G8323 Monoplegia of upper limb affecting right nondominant side: Secondary | ICD-10-CM | POA: Diagnosis not present

## 2019-05-05 DIAGNOSIS — R490 Dysphonia: Secondary | ICD-10-CM | POA: Diagnosis present

## 2019-05-05 DIAGNOSIS — R42 Dizziness and giddiness: Secondary | ICD-10-CM | POA: Diagnosis not present

## 2019-05-05 DIAGNOSIS — H8110 Benign paroxysmal vertigo, unspecified ear: Secondary | ICD-10-CM | POA: Diagnosis not present

## 2019-05-05 LAB — GLUCOSE, CAPILLARY
Glucose-Capillary: 130 mg/dL — ABNORMAL HIGH (ref 70–99)
Glucose-Capillary: 141 mg/dL — ABNORMAL HIGH (ref 70–99)
Glucose-Capillary: 145 mg/dL — ABNORMAL HIGH (ref 70–99)
Glucose-Capillary: 163 mg/dL — ABNORMAL HIGH (ref 70–99)
Glucose-Capillary: 223 mg/dL — ABNORMAL HIGH (ref 70–99)

## 2019-05-05 LAB — URINALYSIS, COMPLETE (UACMP) WITH MICROSCOPIC
Bilirubin Urine: NEGATIVE
Glucose, UA: 150 mg/dL — AB
Hgb urine dipstick: NEGATIVE
Ketones, ur: NEGATIVE mg/dL
Nitrite: NEGATIVE
Protein, ur: 30 mg/dL — AB
Specific Gravity, Urine: 1.019 (ref 1.005–1.030)
pH: 5 (ref 5.0–8.0)

## 2019-05-05 LAB — SARS CORONAVIRUS 2 (TAT 6-24 HRS): SARS Coronavirus 2: NEGATIVE

## 2019-05-05 LAB — HEMOGLOBIN A1C
Hgb A1c MFr Bld: 8.3 % — ABNORMAL HIGH (ref 4.8–5.6)
Mean Plasma Glucose: 191.51 mg/dL

## 2019-05-05 LAB — LIPID PANEL
Cholesterol: 128 mg/dL (ref 0–200)
HDL: 51 mg/dL (ref >40)
LDL Cholesterol: 57 mg/dL (ref 0–99)
Total CHOL/HDL Ratio: 2.5 RATIO
Triglycerides: 99 mg/dL (ref <150)
VLDL: 20 mg/dL (ref 0–40)

## 2019-05-05 LAB — RAPID URINE DRUG SCREEN, HOSP PERFORMED
Amphetamines: NOT DETECTED
Barbiturates: NOT DETECTED
Benzodiazepines: NOT DETECTED
Cocaine: NOT DETECTED
Opiates: NOT DETECTED
Tetrahydrocannabinol: NOT DETECTED

## 2019-05-05 LAB — ECHOCARDIOGRAM COMPLETE
Height: 67 in
Weight: 3200 oz

## 2019-05-05 LAB — T4, FREE: Free T4: 0.85 ng/dL (ref 0.61–1.12)

## 2019-05-05 LAB — FOLATE: Folate: 9.2 ng/mL (ref >5.9)

## 2019-05-05 LAB — C-REACTIVE PROTEIN: CRP: <0.8 mg/dL (ref <1.0)

## 2019-05-05 LAB — CK: Total CK: 147 U/L (ref 38–234)

## 2019-05-05 LAB — MAGNESIUM: Magnesium: 2.3 mg/dL (ref 1.7–2.4)

## 2019-05-05 LAB — SEDIMENTATION RATE: Sed Rate: 30 mm/hr — ABNORMAL HIGH (ref 0–22)

## 2019-05-05 LAB — TSH: TSH: 0.276 u[IU]/mL — ABNORMAL LOW (ref 0.350–4.500)

## 2019-05-05 LAB — VITAMIN B12: Vitamin B-12: 272 pg/mL (ref 180–914)

## 2019-05-05 MED ORDER — INSULIN GLARGINE 100 UNIT/ML ~~LOC~~ SOLN
15.0000 [IU] | Freq: Every day | SUBCUTANEOUS | Status: DC
  Administered 2019-05-05: 15 [IU] via SUBCUTANEOUS
  Filled 2019-05-05 (×2): qty 0.15

## 2019-05-05 MED ORDER — GADOBUTROL 1 MMOL/ML IV SOLN
7.0000 mL | Freq: Once | INTRAVENOUS | Status: AC | PRN
  Administered 2019-05-05: 10:00:00 7 mL via INTRAVENOUS

## 2019-05-05 MED ORDER — APIXABAN 5 MG PO TABS
5.0000 mg | ORAL_TABLET | Freq: Two times a day (BID) | ORAL | Status: DC
  Administered 2019-05-05 – 2019-05-12 (×15): 5 mg via ORAL
  Filled 2019-05-05 (×16): qty 1

## 2019-05-05 MED ORDER — METHYLPREDNISOLONE SODIUM SUCC 1000 MG IJ SOLR
INTRAMUSCULAR | Status: AC
  Filled 2019-05-05: qty 8

## 2019-05-05 MED ORDER — SODIUM CHLORIDE 0.9 % IV SOLN
500.0000 mg | INTRAVENOUS | Status: DC
  Administered 2019-05-06 (×2): 500 mg via INTRAVENOUS
  Filled 2019-05-05 (×2): qty 4

## 2019-05-05 MED ORDER — INSULIN ASPART 100 UNIT/ML ~~LOC~~ SOLN
0.0000 [IU] | Freq: Three times a day (TID) | SUBCUTANEOUS | Status: DC
  Administered 2019-05-05: 17:00:00 3 [IU] via SUBCUTANEOUS
  Administered 2019-05-05 (×2): 2 [IU] via SUBCUTANEOUS
  Administered 2019-05-06: 11 [IU] via SUBCUTANEOUS
  Administered 2019-05-06: 15 [IU] via SUBCUTANEOUS
  Administered 2019-05-06 – 2019-05-07 (×2): 11 [IU] via SUBCUTANEOUS

## 2019-05-05 NOTE — Progress Notes (Signed)
PROGRESS NOTE  Cassandra Adams WUX:[id number] DOB: [birth year] DOA: 05/04/2019 PCP: Redmond School, MD  Brief History:  73 year old female with a history of CKD stage III, diabetes mellitus type 2, hypertension, TIA, hyperlipidemia, coronary disease, paroxysmal atrial fibrillation, and TIA presenting with right leg weakness which she first noticed on 05/04/2019 around 4:30 AM.  The patient was walking back to her bedroom at that time.  She went back to sleep.  She got up around noontime on 05/04/2019 and still felt her right leg to be weak.  The patient also felt like she had some right upper extremity weakness with numbness and tingling in her right hand.  She denied any visual disturbance, headache, new focal extremity weakness.  She denies any dysarthria or word finding difficulty.  As result, the patient presented for further evaluation.  Notably, the patient recently had a hospitalization from 02/26/2019 through 02/27/2019 during which time she had left upper extremity weakness and difficulty with phonation.  MRI of the brain at that time was negative.  She subsequently followed up with ENT, Dr. Benjamine Mola.  Apparently, her vocal cords were felt to be normal, and the patient was referred to [facility].  The patient states that her phonation has not really changed.  She denies any headache, visual disturbance, fever, chills, chest pain, shortness breath, nausea, vomiting, diarrhea.  She denies any back pain, recent falls or injuries, or right leg pain. In the emergency department, the patient had low-grade temperature 99.4 F.  She was hemodynamically stable saturating 97% room air.  BMP, LFTs, and CBC were essentially unremarkable.  CT of the brain was negative for acute intracranial abnormalities.   Assessment/Plan: Right lower extremity weakness/sensory disturbance -MRI her lumbar spine -Clinical exam reveals some functional components to the patient's deficit -Serum O53--664 -Folic  acid -CPK -Urinalysis -Urine drug screen -TSH 0.276 -Free T4 -Discontinue Zanaflex -CRP<0.8 -ESR 30 -PT eval  Uncontrolled diabetes mellitus type 2, with hyperglycemia -03/04/2019 hemoglobin A1c 9.8 -Start reduced dose Lantus -NovoLog sliding scale -Holding glipizide  Paroxysmal atrial fibrillation -Continue apixaban -Continue carvedilol -Rate controlled  CKD stage III -Baseline creatinine 1.0-1.3 -A.m. BMP  Hyperlipidemia -Continue statin and WelChol  Essential hypertension -Continue carvedilol -Holding hydralazine and lisinopril to allow for permissive hypertension        Disposition Plan:   Home in 1-2 days  Family Communication:   No Family at bedside  Consultants:  neuro  Code Status:  FULL  DVT Prophylaxis:  apixaban   Procedures: As Listed in Progress Note Above  Antibiotics: None   Total time spent 35 minutes.  Greater than 50% spent face to face counseling and coordinating care.      Subjective: Pt states right arm is a bit stronger but right leg weakness is about the same.  She denies recent injury/trauma.  She denies right leg or back pain.  Patient denies fevers, chills, headache, chest pain, dyspnea, nausea, vomiting, diarrhea, abdominal pain, dysuria, hematuria, hematochezia, and melena.   Objective: Vitals:   05/05/19 0000 05/05/19 0049 05/05/19 0257 05/05/19 0515  BP: (!) 180/68 (!) 167/55 (!) 129/47 (!) 139/55  Pulse: (!) 59 85 (!) 59 (!) 53  Resp: (!) 21 20 (!) 21 20  Temp:  99.4 F (37.4 C) 98.6 F (37 C) 98.5 F (36.9 C)  TempSrc:  Oral Oral Oral  SpO2: 96% 100% 98% 97%  Weight:      Height:       No  intake or output data in the 24 hours ending 05/05/19 0807 Weight change:  Exam:   General:  Pt is alert, follows commands appropriately, not in acute distress  HEENT: No icterus, No thrush, No neck mass, [place]/AT  Cardiovascular: RRR, S1/S2, no rubs, no gallops  Respiratory: CTA bilaterally, no wheezing, no  crackles, no rhonchi  Abdomen: Soft/+BS, non tender, non distended, no guarding  Extremities: No edema, No lymphangitis, No petechiae, No rashes, no synovitis  Neuro:  CN II-XII intact, strength 4/5 in RUE,  strength 4/5 LUE, LLE; sensation intact bilateral; no dysmetria; babinski equivocal;  RLE strength 2/5     Data Reviewed: I have personally reviewed following labs and imaging studies Basic Metabolic Panel: Recent Labs  Lab 05/04/19 1709  NA 139  K 4.4  CL 109  CO2 25  GLUCOSE 119*  BUN 22  CREATININE 1.13*  CALCIUM 9.9   Liver Function Tests: Recent Labs  Lab 05/04/19 1709  AST 33  ALT 22  ALKPHOS 85  BILITOT 0.5  PROT 7.4  ALBUMIN 3.7   No results for input(s): LIPASE, AMYLASE in the last 168 hours. No results for input(s): AMMONIA in the last 168 hours. Coagulation Profile: Recent Labs  Lab 05/04/19 1709  INR 1.2   CBC: Recent Labs  Lab 05/04/19 1709  WBC 5.7  NEUTROABS 4.1  HGB 13.5  HCT 40.2  MCV 83.2  PLT 191   Cardiac Enzymes: No results for input(s): CKTOTAL, CKMB, CKMBINDEX, TROPONINI in the last 168 hours. BNP: Invalid input(s): POCBNP CBG: Recent Labs  Lab 05/04/19 1656 05/05/19 0053 05/05/19 0743  GLUCAP 117* 130* 141*   HbA1C: No results for input(s): HGBA1C in the last 72 hours. Urine analysis:    Component Value Date/Time   COLORURINE STRAW (A) 04/12/2019 1143   APPEARANCEUR CLEAR 04/12/2019 1143   LABSPEC 1.009 04/12/2019 1143   PHURINE 5.0 04/12/2019 1143   GLUCOSEU 150 (A) 04/12/2019 1143   HGBUR SMALL (A) 04/12/2019 1143   BILIRUBINUR NEGATIVE 04/12/2019 1143   KETONESUR 5 (A) 04/12/2019 1143   PROTEINUR 30 (A) 04/12/2019 1143   UROBILINOGEN 0.2 09/15/2012 1010   NITRITE NEGATIVE 04/12/2019 1143   LEUKOCYTESUR TRACE (A) 04/12/2019 1143   Sepsis Labs: '@LABRCNTIP'$ (procalcitonin:4,lacticidven:4) ) Recent Results (from the past 240 hour(s))  SARS CORONAVIRUS 2 (Micahel Omlor 6-24 HRS) Nasopharyngeal Nasopharyngeal Swab      Status: None   Collection Time: 05/04/19  7:20 PM   Specimen: Nasopharyngeal Swab  Result Value Ref Range Status   SARS Coronavirus 2 NEGATIVE NEGATIVE Final    Comment: (NOTE) SARS-CoV-2 target nucleic acids are NOT DETECTED. The SARS-CoV-2 RNA is generally detectable in upper and lower respiratory specimens during the acute phase of infection. Negative results do not preclude SARS-CoV-2 infection, do not rule out co-infections with other pathogens, and should not be used as the sole basis for treatment or other patient management decisions. Negative results must be combined with clinical observations, patient history, and epidemiological information. The expected result is Negative. Fact Sheet for Patients: SugarRoll.be Fact Sheet for Healthcare Providers: https://www.woods-mathews.com/ This test is not yet approved or cleared by the [place] FDA and  has been authorized for detection and/or diagnosis of SARS-CoV-2 by FDA under an Emergency Use Authorization (EUA). This EUA will remain  in effect (meaning this test can be used) for the duration of the COVID-19 declaration under Section 56 4(b)(1) of the Act, 21 U.S.C. section 360bbb-3(b)(1), unless the authorization is terminated or revoked sooner. Performed at [facility]  [facility], [place] [address], [place], [place] [address]      Scheduled Meds:   stroke: mapping our early stages of recovery book   Does not apply Once   apixaban  5 mg Oral BID   aspirin EC  81 mg Oral Daily   atorvastatin  80 mg Oral QPM   carvedilol  3.125 mg Oral BID   colesevelam  1,875 mg Oral BID WC   insulin aspart  0-15 Units Subcutaneous TID WC   tiZANidine  4 mg Oral QHS   Continuous Infusions:  Procedures/Studies: Dg Chest 2 View  Result Date: 04/12/2019 CLINICAL DATA:  Acute chest pain following fall.  Initial encounter. EXAM: CHEST - 2 VIEW COMPARISON:  None. FINDINGS: The  cardiomediastinal silhouette is unremarkable. Elevated RIGHT hemidiaphragm again noted. There is no evidence of focal airspace disease, pulmonary edema, suspicious pulmonary nodule/mass, pleural effusion, or pneumothorax. No acute bony abnormalities are identified. IMPRESSION: No active cardiopulmonary disease. Electronically Signed   By: Margarette Canada M.D.   On: 04/12/2019 14:32   Dg Pelvis 1-2 Views  Result Date: 04/12/2019 CLINICAL DATA:  Acute pelvic pain following fall. Initial encounter. 01/14/2019 and prior radiographs EXAM: PELVIS - 1-2 VIEW COMPARISON:  None. FINDINGS: No acute fracture, subluxation or dislocation. Mild degenerative changes in both hips noted. A calcified fibroid is again identified. Degenerative changes in the LOWER lumbar spine again noted. IMPRESSION: No acute abnormality. Electronically Signed   By: Margarette Canada M.D.   On: 04/12/2019 14:34   Ct Head Wo Contrast  Result Date: 05/04/2019 CLINICAL DATA:  Possible stroke, weakness, right-sided weakness and numbness EXAM: CT HEAD WITHOUT CONTRAST TECHNIQUE: Contiguous axial images were obtained from the base of the skull through the vertex without intravenous contrast. COMPARISON:  04/12/2019 FINDINGS: Brain: No evidence of acute infarction, hemorrhage, hydrocephalus, extra-axial collection or mass lesion/mass effect. Vascular: No hyperdense vessel or unexpected calcification. Skull: Normal. Negative for fracture or focal lesion. Sinuses/Orbits: No acute finding. Other: There is a soft tissue contusion of the left forehead, which is improved compared to examination dated 04/12/2019. IMPRESSION: 1.  No acute intracranial pathology. 2. There is a soft tissue contusion of the left forehead, which is improved compared to examination dated 04/12/2019. Electronically Signed   By: Eddie Candle M.D.   On: 05/04/2019 16:35   Ct Head Wo Contrast  Result Date: 04/12/2019 CLINICAL DATA:  Facial trauma. EXAM: CT HEAD WITHOUT CONTRAST CT  MAXILLOFACIAL WITHOUT CONTRAST TECHNIQUE: Multidetector CT imaging of the head and maxillofacial structures were performed using the standard protocol without intravenous contrast. Multiplanar CT image reconstructions of the maxillofacial structures were also generated. COMPARISON:  February 26, 2019 head CT. FINDINGS: CT HEAD FINDINGS Brain: No subdural, epidural, or subarachnoid hemorrhage. Cerebellum, brainstem, and basal cisterns are normal. Ventricles are normal. No mass effect or midline shift. No acute cortical ischemia or infarct identified. Foci of high attenuation left basal ganglia are stable, consistent with calcifications. Vascular: No hyperdense vessel or unexpected calcification. Skull: Normal. Negative for fracture or focal lesion. Other: Soft tissue swelling is seen in the left periorbital region, extending into the forehead with a hematoma. Left globe is intact. No other soft tissue abnormalities are noted. CT MAXILLOFACIAL FINDINGS Osseous: No fracture or mandibular dislocation. No destructive process. Orbits: The orbits are normal in appearance.  The globes are intact. Sinuses: Chronic sinus disease is seen in the right maxillary sinus. No acute sinus disease noted. No sinus wall fractures are identified. Mastoid air cells and  middle ears are well aerated. Soft tissues: There is soft tissue swelling in the left periorbital region, extending into the forehead with a hematoma. Other soft tissues are unremarkable. IMPRESSION: 1. Soft tissue swelling in the left periorbital region. Hematoma over the left forehead. 2. The left globe is intact. 3. No facial bone fractures or calvarial fractures noted. 4. No acute intracranial abnormalities are noted. Electronically Signed   By: Dorise Bullion III M.D   On: 04/12/2019 13:18   Ct Maxillofacial Wo Contrast  Result Date: 04/12/2019 CLINICAL DATA:  Facial trauma. EXAM: CT HEAD WITHOUT CONTRAST CT MAXILLOFACIAL WITHOUT CONTRAST TECHNIQUE: Multidetector  CT imaging of the head and maxillofacial structures were performed using the standard protocol without intravenous contrast. Multiplanar CT image reconstructions of the maxillofacial structures were also generated. COMPARISON:  February 26, 2019 head CT. FINDINGS: CT HEAD FINDINGS Brain: No subdural, epidural, or subarachnoid hemorrhage. Cerebellum, brainstem, and basal cisterns are normal. Ventricles are normal. No mass effect or midline shift. No acute cortical ischemia or infarct identified. Foci of high attenuation left basal ganglia are stable, consistent with calcifications. Vascular: No hyperdense vessel or unexpected calcification. Skull: Normal. Negative for fracture or focal lesion. Other: Soft tissue swelling is seen in the left periorbital region, extending into the forehead with a hematoma. Left globe is intact. No other soft tissue abnormalities are noted. CT MAXILLOFACIAL FINDINGS Osseous: No fracture or mandibular dislocation. No destructive process. Orbits: The orbits are normal in appearance.  The globes are intact. Sinuses: Chronic sinus disease is seen in the right maxillary sinus. No acute sinus disease noted. No sinus wall fractures are identified. Mastoid air cells and middle ears are well aerated. Soft tissues: There is soft tissue swelling in the left periorbital region, extending into the forehead with a hematoma. Other soft tissues are unremarkable. IMPRESSION: 1. Soft tissue swelling in the left periorbital region. Hematoma over the left forehead. 2. The left globe is intact. 3. No facial bone fractures or calvarial fractures noted. 4. No acute intracranial abnormalities are noted. Electronically Signed   By: Dorise Bullion III M.D   On: 04/12/2019 13:18    Orson Eva, DO  [facility] Pager [phone]  If 7PM-7AM, please contact night-coverage [email or web address] Password [email or web address] 05/05/2019, 8:07 AM   LOS: 0 days

## 2019-05-05 NOTE — Plan of Care (Signed)
  Problem: Ischemic Stroke/TIA Tissue Perfusion: Goal: Complications of ischemic stroke/TIA will be minimized Outcome: Adequate for Discharge   Problem: Nutrition: Goal: Risk of aspiration will decrease Outcome: Adequate for Discharge   Problem: Self-Care: Goal: Ability to participate in self-care as condition permits will improve Outcome: Progressing Goal: Verbalization of feelings and concerns over difficulty with self-care will improve Outcome: Adequate for Discharge Goal: Ability to communicate needs accurately will improve Outcome: Adequate for Discharge   Problem: Health Behavior/Discharge Planning: Goal: Ability to manage health-related needs will improve Outcome: Adequate for Discharge   Problem: Coping: Goal: Will verbalize positive feelings about self Outcome: Adequate for Discharge   Problem: Education: Goal: Knowledge of disease or condition will improve Outcome: Adequate for Discharge

## 2019-05-05 NOTE — Evaluation (Signed)
Physical Therapy Evaluation Patient Details Name: Cassandra Adams MRN: [id number] DOB: 10-Jun-1946 Today's Date: 05/05/2019   History of Present Illness  Cassandra Adams is a 73 y.o. female with medical history significant of hypertension, hyperlipidemia, diabetes mellitus type 2, paroxysmal atrial fibrillation, coronary artery disease, history of TIA who presented to the ER with right-sided weakness.  Patient states she was doing well until around noon on 05/04/2019.  She says she had woken up at 4:30 in the morning when she was fine and had gone to the bathroom and then went back to sleep.  When she woke up at around noon she noted she had significant right-sided weakness mainly in the right lower extremity.  She did complain of some right upper extremity weakness as well. She also reports having decreased sensation on the right upper and lower extremity. She does have difficulty with speaking and has a very hoarse and soft voice and only is able to speak in whispers.  She was seen by ENT as an outpatient by Dr. Lorelee Cover and apparently had a laryngoscopy, reports not available to the patient says there may have been some issue with vocal cords.  She was referred to a specialist in [place] as per her.  She does have feeling of dysphagia with solid foods feeling that they get stuck in throat occasionally.  The symptoms are present since prior admission.Review of records shows patient was admitted from 02/26/2019-02/27/2019 with difficulty speaking, left-sided weakness and had an MRI of her brain at the time to rule out stroke which was noted to be negative.  She was thought to have possible TIA as well as concern for vocal cord dysfunction at the time.    Clinical Impression  Patient presents with right hip flexor and ankle dorsiflexor weakness during MMT, unable to lift right leg against gravity seated, demonstrates slow slightly labored cadence without loss of balance and able to advance RLE for taking  steps without dragging of foot, limited for ambulation mostly due to c/o fatigue.  Patient tolerated sitting up in chair after therapy.  Patient will benefit from continued physical therapy in hospital and recommended venue below to increase strength, balance, endurance for safe ADLs and gait.     Follow Up Recommendations Home health PT;Supervision for mobility/OOB;Supervision - Intermittent    Equipment Recommendations  None recommended by PT    Recommendations for Other Services       Precautions / Restrictions Precautions Precautions: Fall Restrictions Weight Bearing Restrictions: No      Mobility  Bed Mobility Overal bed mobility: Modified Independent             General bed mobility comments: increased time  Transfers Overall transfer level: Needs assistance Equipment used: Rolling walker (2 wheeled) Transfers: Sit to/from [employer] Transfers Sit to Stand: Supervision;Min guard Stand pivot transfers: Supervision;Min guard          Ambulation/Gait Ambulation/Gait assistance: Supervision;Min guard Gait Distance (Feet): 45 Feet Assistive device: Rolling walker (2 wheeled) Gait Pattern/deviations: Decreased step length - right;Decreased stride length Gait velocity: decreased   General Gait Details: slow slightly labored cadence without loss of balance, limited secondary to c/o fatigue  Stairs            Wheelchair Mobility    Modified Rankin (Stroke Patients Only)       Balance Overall balance assessment: Needs assistance Sitting-balance support: Feet supported;No upper extremity supported Sitting balance-Leahy Scale: Good Sitting balance - Comments: seated at EOB   Standing balance  support: During functional activity;Bilateral upper extremity supported Standing balance-Leahy Scale: Fair Standing balance comment: using RW                             Pertinent Vitals/Pain Pain Assessment: 0-10 Pain Score: 2  Pain  Location: right hand Pain Descriptors / Indicators: Numbness Pain Intervention(s): Limited activity within patient's tolerance;Monitored during session    Home Living Family/patient expects to be discharged to:: Private residence Living Arrangements: Children Available Help at Discharge: Family;Available PRN/intermittently Type of Home: Apartment Home Access: Level entry     Home Layout: One level Home Equipment: Clinical cytogeneticist - 2 wheels;Cane - single point      Prior Function Level of Independence: Independent with assistive device(s)         Comments: household and short distanced Estate agent Dominance   Dominant Hand: Right    Extremity/Trunk Assessment   Upper Extremity Assessment Upper Extremity Assessment: Generalized weakness;RUE deficits/detail RUE Deficits / Details: fair/poor grip strength with right hand, otherwise 4+/5    Lower Extremity Assessment Lower Extremity Assessment: Generalized weakness;RLE deficits/detail RLE Deficits / Details: 2+/5 right hip flexors, ankle dorsiflexors 3+/5  while seated, but able to advance against gravity during ambulation RLE Sensation: WNL RLE Coordination: WNL    Cervical / Trunk Assessment Cervical / Trunk Assessment: Normal  Communication   Communication: Expressive difficulties;Other (comment)(has difficulty speaking, has to speak softly)  Cognition Arousal/Alertness: Awake/alert Behavior During Therapy: WFL for tasks assessed/performed Overall Cognitive Status: Within Functional Limits for tasks assessed                                        General Comments      Exercises     Assessment/Plan    PT Assessment Patient needs continued PT services  PT Problem List Decreased strength;Decreased activity tolerance;Decreased balance;Decreased mobility       PT Treatment Interventions Gait training;Stair training;Patient/family education;Functional mobility  training;Therapeutic activities;Therapeutic exercise    PT Goals (Current goals can be found in the Care Plan section)  Acute Rehab PT Goals Patient Stated Goal: return home with family to assist PT Goal Formulation: With patient Time For Goal Achievement: 05/08/19 Potential to Achieve Goals: Good    Frequency Min 3X/week   Barriers to discharge        Co-evaluation               AM-PAC PT "6 Clicks" Mobility  Outcome Measure Help needed turning from your back to your side while in a flat bed without using bedrails?: None Help needed moving from lying on your back to sitting on the side of a flat bed without using bedrails?: None Help needed moving to and from a bed to a chair (including a wheelchair)?: A Little Help needed standing up from a chair using your arms (e.g., wheelchair or bedside chair)?: A Little Help needed to walk in hospital room?: A Little Help needed climbing 3-5 steps with a railing? : A Little 6 Click Score: 20    End of Session Equipment Utilized During Treatment: Gait belt Activity Tolerance: Patient tolerated treatment well;Patient limited by fatigue Patient left: in chair;with call bell/phone within reach Nurse Communication: Mobility status PT Visit Diagnosis: Unsteadiness on feet (R26.81);Other abnormalities of gait and mobility (R26.89);Muscle weakness (generalized) (M62.81)    Time: 1601-0932 PT  Time Calculation (min) (ACUTE ONLY): 28 min   Charges:   PT Evaluation $PT Eval Moderate Complexity: 1 Mod PT Treatments $Therapeutic Activity: 23-37 mins        2:28 PM, 05/05/19 Lonell Grandchild, MPT Physical Therapist with [facility] [phone] office [phone] mobile phone

## 2019-05-05 NOTE — Progress Notes (Signed)
Contacted daughter on behalf of Patient. Left voicemail.

## 2019-05-05 NOTE — Plan of Care (Signed)
  Problem: Acute Rehab PT Goals(only PT should resolve) Goal: Pt Will Go Supine/Side To Sit Flowsheets (Taken 05/05/2019 1430) Pt will go Supine/Side to Sit: Independently Goal: Patient Will Transfer Sit To/From Stand Flowsheets (Taken 05/05/2019 1430) Patient will transfer sit to/from stand: with supervision Goal: Pt Will Transfer Bed To Chair/Chair To Bed Flowsheets (Taken 05/05/2019 1430) Pt will Transfer Bed to Chair/Chair to Bed: with supervision Goal: Pt Will Ambulate Flowsheets (Taken 05/05/2019 1430) Pt will Ambulate:  100 feet  with supervision  with min guard assist  with rolling walker   2:31 PM, 05/05/19 Lonell Grandchild, MPT Physical Therapist with [facility] [phone] office [phone] mobile phone

## 2019-05-05 NOTE — Consult Note (Addendum)
Reform A. Merlene Laughter, MD     [email or web address]          Cassandra Adams is an 73 y.o. female.   ASSESSMENT/PLAN: 1. Acute/subacute right lower extremity monoplegia with physical examination highly suggestive of a myelopathy. Imaging so far has not revealed myelopathy involving the cervical spine. This leaves the thoracic spine as the potential etiology. Cervical spine MRI is recommended. If this is negative, EEG is recommended. Also consider nerve conduction study and needle examination to evaluate for other potential etiologies including diabetic amyotrophy and amyotrophic lateral sclerosis. I will start the patient on moderate to high doses of steroids for now. 2. Mild motor impairment of the right hand of unclear etiology. There is some suggestion that the patient may have carpal tunnel syndrome which could be the etiology. 3. Numbness and pain of the right hand which seems consistent with carpal tunnel syndrome. 4. Diabetic polyneuropathy. 5. Previous right hemiparesis of on clear etiology. 6. Hypophonia currently due to primarily vocal cord issues. No evidence of parkinsonian syndrome. The history and examination does not support a typical case of myasthenia gravis. 7. Repeat falls likely due to 1.    The patient is a 73 year old right-handed black female presents with 2 week history of frequent falls. She reports developing relatively acute onset of right lower extremity weakness a few days ago. This resulted in the patient is seeking medical attention. She has a long history of pain and numbness of the right hand which seems consistent with carpal tunnel syndrome. She has been workup in the past for right hemiparesis of unclear etiology. Workup was extensive and reviewed.   The patient's he is noted to have significant hypophonia. She has had this for several months. It is being worked up by Orthoptist. She actually has appointment at [facility] for  further evaluation of this. The patient does not report significant fluctuation in the hypophonia. She does not report difficulty swallowing. No reports of dizziness or headaches. She does not report bowel or bladder incontinence. The review is not otherwise negative.      GENERAL:  Pleasant female is in no acute distress.  HEENT:  Neck is supple no trauma appreciated.  ABDOMEN: Soft  EXTREMITIES: No edema   BACK: Normal alignment.  SKIN: Normal by inspection.    MENTAL STATUS: Alert and oriented. Speech, language and cognition are generally intact. Judgment and insight normal.   CRANIAL NERVES: Pupils are equal, round and reactive to light and accommodation; extraocular movements are full, there is no significant nystagmus; upper and lower facial muscles are normal in strength and symmetric including the obicularis oculi, there is no flattening of the nasolabial folds; tongue is midline; uvula is midline; shoulder elevation is normal.  MOTOR:  She has normal tone bulk and strength of the upper extremities. The right leg is severely spastic with 0/5 movement. The left leg is moderately spastic. Strength is graded as 4/5.  COORDINATION: Left finger to nose is normal, right finger to nose is normal, No rest tremor; no intention tremor; no postural tremor; no bradykinesia.  REFLEXES: Deep tendon reflexes are symmetrical and normal in the upper extremities but absent in the legs. Plantar responses are flexor bilaterally.   SENSATION: Normal to light touch and temperature.   Blood pressure (!) 166/62, pulse (!) 55, temperature 98.3 F (36.8 C), temperature source Oral, resp. rate 20, height 5\' 7"  (1.702 m), weight 90.7 kg, SpO2 100 %.  Past Medical History:  Diagnosis Date  .  Arthritis    "all over"  . CAD (coronary artery disease)    a. NSTEMI 12/2013 - occluded dominant RCA with L-R collaterals, moderate prox segmental LAD disease, for medical therapy initially, EF 60% with subtle  inferobasal hypokinesia. Consider PCI for refractory CP.  [name] CKD (chronic kidney disease), stage III   . Hypertension   . Migraines    "weekly" (01/06/2014)  . Sickle cell trait ([place])   . TIA (transient ischemic attack) 1978  . Type II diabetes mellitus ([place])   . Vertigo     Past Surgical History:  Procedure Laterality Date  . APPENDECTOMY  March 2014   had appendix frozen  . CARDIAC CATHETERIZATION  "years ago" & 01/05/2014  . CARDIOVERSION N/A 03/17/2014   Procedure: CARDIOVERSION;  Surgeon: [name], MD;  Location[facility] ENDOSCOPY;  Service: Cardiovascular;  Laterality: N/A;  . CATARACT EXTRACTION W/ INTRAOCULAR LENS  IMPLANT, BILATERAL Bilateral 04/2013-05/2013  . ENDARTERECTOMY Right 02/05/2017   Procedure: ENDARTERECTOMY CAROTID-RIGHT;  Surgeon: [name], MD;  Location: [facility] OR;  Service: Vascular;  Laterality: Right;  . ESOPHAGOGASTRODUODENOSCOPY  11/08/2007    Normal esophagus without evidence of Barrett, mass, erosion/ Normal stomach, duodenal bulb  . GASTRIC MOTILITY STUDY  11/13/2007   mildly delayed emptying subjectively, but normal  analysis-77% of tracer emptied at 2 hours  . JOINT REPLACEMENT    . LAPAROSCOPIC APPENDECTOMY N/A 09/15/2012   Procedure: APPENDECTOMY LAPAROSCOPIC;  Surgeon: [name], MD;  Location: [facility] ORS;  Service: General;  Laterality: N/A;  . LEFT HEART CATHETERIZATION WITH CORONARY ANGIOGRAM N/A 01/05/2014   Procedure: LEFT HEART CATHETERIZATION WITH CORONARY ANGIOGRAM;  Surgeon: [name], MD;  Location: [facility] CATH LAB;  Service: Cardiovascular;  Laterality: N/A;  . REPLACEMENT TOTAL KNEE Right 05-03-06  . TEE WITHOUT CARDIOVERSION N/A 03/17/2014   Procedure: TRANSESOPHAGEAL ECHOCARDIOGRAM (TEE);  Surgeon: [name], MD;  Location: [facility] ENDOSCOPY;  Service: Cardiovascular;  Laterality: N/A;  . TONSILLECTOMY  1972    Family History  Problem Relation Age of Onset  . Hyperlipidemia Mother   . Hypertension Mother   . Heart attack  Mother   . Cancer Father        lung  . Hypertension Sister   . Diabetes Brother   . Heart disease Brother   . Hyperlipidemia Brother   . Hypertension Brother   . Heart attack Brother   . Other Brother        DVT    Social History:  reports that she has never smoked. She has never used smokeless tobacco. She reports that she does not drink alcohol or use drugs.  Allergies:  Allergies  Allergen Reactions  . Penicillins Hives    Has patient had a PCN reaction causing immediate rash, facial/tongue/throat swelling, SOB or lightheadedness with hypotension: no Has patient had a PCN reaction causing severe rash involving mucus membranes or skin necrosis: No  Has patient had a PCN reaction that required hospitalization: no Has patient had a PCN reaction occurring within the last 10 years: no If all of the above answers are "NO", then may proceed with Cephalosporin use.     Medications: Prior to Admission medications   Medication Sig Start Date End Date Taking? Authorizing Provider  aspirin EC 81 MG tablet Take 81 mg by mouth daily.   Yes [provider]  atorvastatin (LIPITOR) 80 MG tablet Take 1 tablet (80 mg total) by mouth every evening. 11/26/17  Yes Hilty, Nadean Corwin, MD  carvedilol (  COREG) 3.125 MG tablet Take 3.125 mg by mouth 2 (two) times daily. 12/23/18  Yes [provider]  colesevelam (WELCHOL) 625 MG tablet Take 1,875 mg by mouth 2 (two) times daily with a meal. *May take 6 tablets once a day with meals*   Yes [provider]  ELIQUIS 5 MG TABS tablet Take 1 tablet (5 mg total) by mouth 2 (two) times daily. 12/17/18  Yes Hilty, Nadean Corwin, MD  furosemide (LASIX) 40 MG tablet Take 40 mg by mouth 2 (two) times daily.  10/25/17  Yes [provider]  glipiZIDE (GLUCOTROL XL) 5 MG 24 hr tablet Take 1 tablet (5 mg total) by mouth daily with breakfast. 03/26/19  Yes Nida, Marella Chimes, MD  hydrALAZINE (APRESOLINE) 25 MG tablet Take 1 tablet (25 mg  total) by mouth 3 (three) times daily. 02/27/19 02/27/20 Yes Pokhrel, Laxman, MD  Insulin Glargine (BASAGLAR KWIKPEN) 100 UNIT/ML SOPN Inject 36 Units into the skin at bedtime.    Yes [provider]  insulin lispro (HUMALOG) 100 UNIT/ML injection Inject 10 Units into the skin 3 (three) times daily before meals.    Yes [provider]  lisinopril (ZESTRIL) 10 MG tablet Take 10 mg by mouth daily.    Yes [provider]  nitroGLYCERIN (NITROSTAT) 0.4 MG SL tablet Place 1 tablet (0.4 mg total) under the tongue every 5 (five) minutes as needed for chest pain (up to 3 doses). 11/26/17  Yes Hilty, Nadean Corwin, MD  nystatin cream (MYCOSTATIN) Apply 1 application topically 2 (two) times daily as needed for dry skin.  11/05/17  Yes [provider]  tiZANidine (ZANAFLEX) 4 MG tablet Take 4 mg by mouth at bedtime.  12/09/15  Yes [provider]    Scheduled Meds: . apixaban  5 mg Oral BID  . aspirin EC  81 mg Oral Daily  . atorvastatin  80 mg Oral QPM  . carvedilol  3.125 mg Oral BID  . colesevelam  1,875 mg Oral BID WC  . insulin aspart  0-15 Units Subcutaneous TID WC  . insulin glargine  15 Units Subcutaneous QHS  . tiZANidine  4 mg Oral QHS   Continuous Infusions: PRN Meds:.acetaminophen, hydrALAZINE, nitroGLYCERIN, ondansetron (ZOFRAN) IV, senna-docusate     Results for orders placed or performed during the hospital encounter of 05/04/19 (from the past 48 hour(s))  CBG monitoring, ED     Status: Abnormal   Collection Time: 05/04/19  4:56 PM  Result Value Ref Range   Glucose-Capillary 117 (H) 70 - 99 mg/dL  Protime-INR     Status: None   Collection Time: 05/04/19  5:09 PM  Result Value Ref Range   Prothrombin Time 15.2 11.4 - 15.2 seconds   INR 1.2 0.8 - 1.2    Comment: (NOTE) INR goal varies based on device and disease states. Performed at [facility], [address] [place], [place] [address]   APTT     Status: Abnormal   Collection Time: 05/04/19   5:09 PM  Result Value Ref Range   aPTT 38 (H) 24 - 36 seconds    Comment:        IF BASELINE aPTT IS ELEVATED, SUGGEST PATIENT RISK ASSESSMENT BE USED TO DETERMINE APPROPRIATE ANTICOAGULANT THERAPY. Performed at [facility], [address], [place], [place] [address]   CBC     Status: None   Collection Time: 05/04/19  5:09 PM  Result Value Ref Range   WBC 5.7 4.0 - 10.5 K/uL  RBC 4.83 3.87 - 5.11 MIL/uL   Hemoglobin 13.5 12.0 - 15.0 g/dL   HCT 40.2 36.0 - 46.0 %   MCV 83.2 80.0 - 100.0 fL   MCH 28.0 26.0 - 34.0 pg   MCHC 33.6 30.0 - 36.0 g/dL   RDW 13.4 11.5 - 15.5 %   Platelets 191 150 - 400 K/uL   nRBC 0.0 0.0 - 0.2 %    Comment: Performed at [facility], [address] [place], [place] [address]  Differential     Status: None   Collection Time: 05/04/19  5:09 PM  Result Value Ref Range   Neutrophils Relative % 70 %   Neutro Abs 4.1 1.7 - 7.7 K/uL   Lymphocytes Relative 20 %   Lymphs Abs 1.1 0.7 - 4.0 K/uL   Monocytes Relative 7 %   Monocytes Absolute 0.4 0.1 - 1.0 K/uL   Eosinophils Relative 1 %   Eosinophils Absolute 0.0 0.0 - 0.5 K/uL   Basophils Relative 1 %   Basophils Absolute 0.0 0.0 - 0.1 K/uL   Immature Granulocytes 1 %   Abs Immature Granulocytes 0.05 0.00 - 0.07 K/uL    Comment: Performed at [facility], [address] [place], [place] [address]  Comprehensive metabolic panel     Status: Abnormal   Collection Time: 05/04/19  5:09 PM  Result Value Ref Range   Sodium 139 135 - 145 mmol/L   Potassium 4.4 3.5 - 5.1 mmol/L   Chloride 109 98 - 111 mmol/L   CO2 25 22 - 32 mmol/L   Glucose, Bld 119 (H) 70 - 99 mg/dL   BUN 22 8 - 23 mg/dL   Creatinine, Ser 1.13 (H) 0.44 - 1.00 mg/dL   Calcium 9.9 8.9 - 10.3 mg/dL   Total Protein 7.4 6.5 - 8.1 g/dL   Albumin 3.7 3.5 - 5.0 g/dL   AST 33 15 - 41 U/L   ALT 22 0 - 44 U/L   Alkaline Phosphatase 85 38 - 126 U/L   Total Bilirubin 0.5 0.3 - 1.2 mg/dL   GFR calc non Af Amer 48 (L) >60 mL/min   GFR calc Af Amer  55 (L) >60 mL/min   Anion gap 5 5 - 15    Comment: Performed at [facility], [address], [place], [place] [address]  TSH     Status: Abnormal   Collection Time: 05/04/19  5:09 PM  Result Value Ref Range   TSH 0.276 (L) 0.350 - 4.500 uIU/mL    Comment: Performed by a 3rd Generation assay with a functional sensitivity of <=0.01 uIU/mL. Performed at [facility], [address] [place], [place] [address]   Hemoglobin A1c     Status: Abnormal   Collection Time: 05/04/19  5:09 PM  Result Value Ref Range   Hgb A1c MFr Bld 8.3 (H) 4.8 - 5.6 %    Comment: (NOTE) Pre diabetes:          5.7%-6.4% Diabetes:              >6.4% Glycemic control for   <7.0% adults with diabetes    Mean Plasma Glucose 191.51 mg/dL    Comment: Performed at [facility] [address], [place], [place] [address]  SARS CORONAVIRUS 2 (TAT 6-24 HRS) Nasopharyngeal Nasopharyngeal Swab     Status: None   Collection Time: 05/04/19  7:20 PM   Specimen: Nasopharyngeal Swab  Result Value Ref Range   SARS Coronavirus 2 NEGATIVE NEGATIVE  Comment: (NOTE) SARS-CoV-2 target nucleic acids are NOT DETECTED. The SARS-CoV-2 RNA is generally detectable in upper and lower respiratory specimens during the acute phase of infection. Negative results do not preclude SARS-CoV-2 infection, do not rule out co-infections with other pathogens, and should not be used as the sole basis for treatment or other patient management decisions. Negative results must be combined with clinical observations, patient history, and epidemiological information. The expected result is Negative. Fact Sheet for Patients: SugarRoll.be Fact Sheet for Healthcare Providers: https://www.woods-mathews.com/ This test is not yet approved or cleared by the [place] FDA and  has been authorized for detection and/or diagnosis of SARS-CoV-2 by FDA under an Emergency Use Authorization (EUA). This EUA will  remain  in effect (meaning this test can be used) for the duration of the COVID-19 declaration under Section 56 4(b)(1) of the Act, 21 U.S.C. section 360bbb-3(b)(1), unless the authorization is terminated or revoked sooner. Performed at [facility], [place] [address] [place], [place] [address]   Sedimentation rate     Status: Abnormal   Collection Time: 05/04/19 11:50 PM  Result Value Ref Range   Sed Rate 30 (H) 0 - 22 mm/hr    Comment: Performed at [facility], [address] [place], [place] [address]  C-reactive protein     Status: None   Collection Time: 05/04/19 11:50 PM  Result Value Ref Range   CRP <0.8 <1.0 mg/dL    Comment: Performed at [facility], [address], [place], [place] [address]  Vitamin B12     Status: None   Collection Time: 05/04/19 11:50 PM  Result Value Ref Range   Vitamin B-12 272 180 - 914 pg/mL    Comment: (NOTE) This assay is not validated for testing neonatal or myeloproliferative syndrome specimens for Vitamin B12 levels. Performed at [facility], [address], [place], [place] [address]   Lipid panel     Status: None   Collection Time: 05/04/19 11:50 PM  Result Value Ref Range   Cholesterol 128 0 - 200 mg/dL   Triglycerides 99 <150 mg/dL   HDL 51 >40 mg/dL   Total CHOL/HDL Ratio 2.5 RATIO   VLDL 20 0 - 40 mg/dL   LDL Cholesterol 57 0 - 99 mg/dL    Comment:        Total Cholesterol/HDL:CHD Risk Coronary Heart Disease Risk Table                     Men   Women  1/2 Average Risk   3.4   3.3  Average Risk       5.0   4.4  2 X Average Risk   9.6   7.1  3 X Average Risk  23.4   11.0        Use the calculated Patient Ratio above and the CHD Risk Table to determine the patient's CHD Risk.        ATP III CLASSIFICATION (LDL):  <100     mg/dL   Optimal  100-129  mg/dL   Near or Above                    Optimal  130-159  mg/dL   Borderline  160-189  mg/dL   High  >190     mg/dL   Very High Performed at [facility], [address], [place], [place] [address]   Glucose, capillary     Status: Abnormal   Collection  Time: 05/05/19 12:53 AM  Result Value Ref Range   Glucose-Capillary 130 (H) 70 - 99 mg/dL   Comment 1 Notify RN    Comment 2 Document in Chart   T4, free     Status: None   Collection Time: 05/05/19  4:48 AM  Result Value Ref Range   Free T4 0.85 0.61 - 1.12 ng/dL    Comment: (NOTE) Biotin ingestion may interfere with free T4 tests. If the results are inconsistent with the TSH level, previous test results, or the clinical presentation, then consider biotin interference. If needed, order repeat testing after stopping biotin. Performed at [facility], [place] [address] [place], [place] [address]   Folate     Status: None   Collection Time: 05/05/19  4:48 AM  Result Value Ref Range   Folate 9.2 >5.9 ng/mL    Comment: Performed at [facility], [address] [place], [place] [address]  Magnesium     Status: None   Collection Time: 05/05/19  4:48 AM  Result Value Ref Range   Magnesium 2.3 1.7 - 2.4 mg/dL    Comment: Performed at [facility], [address], [place], [place] [address]  CK     Status: None   Collection Time: 05/05/19  4:48 AM  Result Value Ref Range   Total CK 147 38 - 234 U/L    Comment: Performed at [facility], [address], [place], [place] [address]  Glucose, capillary     Status: Abnormal   Collection Time: 05/05/19  7:43 AM  Result Value Ref Range   Glucose-Capillary 141 (H) 70 - 99 mg/dL  Urinalysis, Complete w Microscopic     Status: Abnormal   Collection Time: 05/05/19  8:21 AM  Result Value Ref Range   Color, Urine YELLOW YELLOW   APPearance CLEAR CLEAR   Specific Gravity, Urine 1.019 1.005 - 1.030   pH 5.0 5.0 - 8.0   Glucose, UA 150 (A) NEGATIVE mg/dL   Hgb urine dipstick NEGATIVE NEGATIVE   Bilirubin Urine NEGATIVE NEGATIVE   Ketones, ur NEGATIVE NEGATIVE mg/dL   Protein, ur 30 (A) NEGATIVE mg/dL   Nitrite NEGATIVE NEGATIVE   Leukocytes,Ua LARGE (A)  NEGATIVE   RBC / HPF 11-20 0 - 5 RBC/hpf   WBC, UA 21-50 0 - 5 WBC/hpf   Bacteria, UA RARE (A) NONE SEEN   Squamous Epithelial / LPF 6-10 0 - 5   Mucus PRESENT     Comment: Performed at [facility], [address], [place], [place] [address]  Urine rapid drug screen (hosp performed)     Status: None   Collection Time: 05/05/19  8:21 AM  Result Value Ref Range   Opiates NONE DETECTED NONE DETECTED   Cocaine NONE DETECTED NONE DETECTED   Benzodiazepines NONE DETECTED NONE DETECTED   Amphetamines NONE DETECTED NONE DETECTED   Tetrahydrocannabinol NONE DETECTED NONE DETECTED   Barbiturates NONE DETECTED NONE DETECTED    Comment: (NOTE) DRUG SCREEN FOR MEDICAL PURPOSES ONLY.  IF CONFIRMATION IS NEEDED FOR ANY PURPOSE, NOTIFY LAB WITHIN 5 DAYS. LOWEST DETECTABLE LIMITS FOR URINE DRUG SCREEN Drug Class                     Cutoff (ng/mL) Amphetamine and metabolites    1000 Barbiturate and metabolites    200 Benzodiazepine                 132 Tricyclics and metabolites     300  Opiates and metabolites        300 Cocaine and metabolites        300 THC                            50 Performed at [facility], [address], [place], [place] [address]   Glucose, capillary     Status: Abnormal   Collection Time: 05/05/19 11:26 AM  Result Value Ref Range   Glucose-Capillary 145 (H) 70 - 99 mg/dL  Glucose, capillary     Status: Abnormal   Collection Time: 05/05/19  4:24 PM  Result Value Ref Range   Glucose-Capillary 163 (H) 70 - 99 mg/dL   Comment 1 Notify RN    Comment 2 Document in Chart     Studies/Results:  C SPINE MRI FINDINGS: Alignment: Straightening of the cervical lordosis. There is focal kyphosis at C5-C6.  Vertebrae: Vertebral body heights are maintained apart from anterior wedging and endplate irregularity at C5 and C6. There is no significant marrow edema. No suspicious osseous lesion.  Cord: No abnormal cord signal.  Posterior Fossa, vertebral arteries,  paraspinal tissues: Unremarkable.  Disc levels:  C2-C3:  Disc bulge.  No significant canal or foraminal stenosis.  C3-C4: Small central disc protrusion. Mild canal stenosis. No foraminal stenosis.  C4-C5: Very small central disc protrusion. Mild facet and uncovertebral hypertrophy. No canal or foraminal stenosis.  C5-C6: Right central disc extrusion extending below the disc level, endplate osteophytes, and facet and uncovertebral hypertrophy. Mild canal stenosis with flattening of the right ventral aspect of the cord. No foraminal stenosis.  C6-C7: Disc bulge, endplate osteophytes, and facet and uncovertebral hypertrophy. Moderate canal stenosis. Marked foraminal stenosis, right greater than left.  C7-T1:  Facet hypertrophy.  No canal or foraminal stenosis.  IMPRESSION: Multilevel degenerative changes as detailed above, greatest at C5-C6 and C6-C7.       L SPINE MRI FINDINGS: Motion artifact is present.  Segmentation: For the purposes of this dictation, there are 5 lumbar type vertebral bodies with the caudal most designated L5.  Alignment:  There is mild anterolisthesis at L3-L4.  Vertebrae: Vertebral body heights are maintained apart from degenerative endplate irregularity and Schmorl's nodes, greatest at L1-L2 and L2-L3. There is degenerative endplate marrow edema at the levels as well. Degenerative marrow edema is also present at the right L4-L5 facet joint.  Conus medullaris and cauda equina: Conus extends to the L1 level. Conus and cauda equina appear normal.  Paraspinal and other soft tissues: Unremarkable.  Disc levels:  L1-L2: Disc bulge and facet arthropathy with ligamentum flavum infolding. Mild canal stenosis. Mild to moderate right and mild left foraminal stenosis.  L2-L3: Disc bulge and facet arthropathy (marked on the right) with ligamentum flavum infolding. Moderate canal stenosis with narrowing of the lateral recesses. Mild  foraminal stenosis.  L3-L4: Anterolisthesis with uncovering of disc bulge and marked facet arthropathy with ligamentum flavum infolding. Marked canal stenosis with narrowing of the lateral recesses. Moderate to marked right and mild left foraminal stenosis.  L4-L5: Disc bulge with endplate osteophytic ridging. Moderate to marked facet arthropathy with joint effusion on the right and ligamentum flavum infolding. Moderate canal stenosis with narrowing of the lateral recesses. Moderate to marked foraminal stenosis.  L5-S1: Small right far lateral disc protrusion. Moderate facet arthropathy. No canal stenosis. Mild right foraminal stenosis. No left foraminal stenosis.  IMPRESSION: Multilevel degenerative changes as detailed above. Canal and lateral recess stenosis are greatest  at L3-L4. Right foraminal stenosis is greatest at L3-L4 and L4-L5.        BRAIN MRI FINDINGS: Brain: There is no acute infarction or intracranial hemorrhage. There is no intracranial mass, mass effect, edema, hydrocephalus, or extra-axial fluid collection. Patchy T2 hyperintensity in the supratentorial white matter is nonspecific but may reflect stable mild chronic microvascular ischemic changes. Ventricles and sulci are normal in size.  Vascular: Major vessel flow voids at the skull base are preserved.  Skull and upper cervical spine: Marrow signal is within normal limits. Cervical spine is better evaluated on concurrent dedicated imaging.  Sinuses/Orbits: Mild paranasal sinus mucosal thickening. Bilateral lens replacements.  Other: Left frontal scalp hematoma as seen on prior CT imaging. Trace right mastoid fluid opacification.  IMPRESSION: No evidence of acute infarction, intracranial hemorrhage, or mass. Stable mild chronic microvascular ischemic changes.        The lumbar spine MRI is reviewed in person. There is multilevel disc protrusion most notably at  L2-L3 to  L3-L4  levels. There is severe canal stenosis due to disc protrusion, severe ligamentum flavum hypertrophy and facet hypertrophy.  Presli Fanguy A. Merlene Laughter, M.D.  Diplomate, Tax adviser of Psychiatry and Neurology ( Neurology). 05/05/2019, 5:08 PM

## 2019-05-05 NOTE — Progress Notes (Signed)
  Echocardiogram 2D Echocardiogram has been performed.  Cassandra Adams 05/05/2019, 12:43 PM

## 2019-05-05 NOTE — Evaluation (Signed)
Speech Language Pathology Evaluation Patient Details Name: Cassandra Adams MRN: [id number] DOB: 03/28/1946 Today's Date: 05/05/2019 Time: 1230-1300 SLP Time Calculation (min) (ACUTE ONLY): 30 min  Problem List:  Patient Active Problem List   Diagnosis Date Noted  . Right hemiparesis ([place]) 05/05/2019  . Uncontrolled type 2 diabetes mellitus with hyperglycemia ([place]) 05/05/2019  . CKD stage 3 due to type 2 diabetes mellitus ([place]) 05/05/2019  . Left-sided weakness 05/04/2019  . Aphasia 02/26/2019  . DM type 2 causing vascular disease ([place]) 06/11/2017  . Type 2 diabetes mellitus with stage 4 chronic kidney disease, with long-term current use of insulin ([place]) 06/11/2017  . Carotid stenosis 02/05/2017  . TIA (transient ischemic attack) 01/20/2017  . Carotid stenosis, right 01/20/2017  . Diabetic hyperosmolar non-ketotic state ([place]) 01/19/2017  . AKI (acute kidney injury) ([place]) 01/19/2017  . Left leg weakness 01/19/2017  . Dyslipidemia 11/24/2016  . Edema 03/30/2016  . Diaphoresis 02/18/2016  . Hypoglycemia 02/18/2016  . Bradycardia 04/22/2015  . Coronary artery disease due to lipid rich plaque 02/26/2014  . PAF (paroxysmal atrial fibrillation) ([place]) 02/26/2014  . NSTEMI (non-ST elevated myocardial infarction) ([place]) 01/03/2014  . Occlusion and stenosis of carotid artery without mention of cerebral infarction 11/21/2013  . Pain in limb-Left neck 11/21/2013  . Carotid stenosis, bilateral 11/17/2011  . HYPERTHYROIDISM, SUBCLINICAL 02/15/2010  . GERD 02/15/2010  . DYSPHAGIA UNSPECIFIED 02/15/2010  . ABDOMINAL PAIN, UNSPECIFIED SITE 02/15/2010  . Controlled insulin-dependent diabetes mellitus with neuropathy 02/11/2010  . Essential hypertension, benign 02/11/2010  . HEADACHE 02/11/2010   Past Medical History:  Past Medical History:  Diagnosis Date  . Arthritis    "all over"  . CAD (coronary artery disease)    a. NSTEMI 12/2013 - occluded dominant RCA with L-R collaterals,  moderate prox segmental LAD disease, for medical therapy initially, EF 60% with subtle inferobasal hypokinesia. Consider PCI for refractory CP.  [name] CKD (chronic kidney disease), stage III   . Hypertension   . Migraines    "weekly" (01/06/2014)  . Sickle cell trait ([place])   . TIA (transient ischemic attack) 1978  . Type II diabetes mellitus ([place])   . Vertigo    Past Surgical History:  Past Surgical History:  Procedure Laterality Date  . APPENDECTOMY  March 2014   had appendix frozen  . CARDIAC CATHETERIZATION  "years ago" & 01/05/2014  . CARDIOVERSION N/A 03/17/2014   Procedure: CARDIOVERSION;  Surgeon: [name], MD;  Location[facility] ENDOSCOPY;  Service: Cardiovascular;  Laterality: N/A;  . CATARACT EXTRACTION W/ INTRAOCULAR LENS  IMPLANT, BILATERAL Bilateral 04/2013-05/2013  . ENDARTERECTOMY Right 02/05/2017   Procedure: ENDARTERECTOMY CAROTID-RIGHT;  Surgeon: [name], MD;  Location: [facility] OR;  Service: Vascular;  Laterality: Right;  . ESOPHAGOGASTRODUODENOSCOPY  11/08/2007    Normal esophagus without evidence of Barrett, mass, erosion/ Normal stomach, duodenal bulb  . GASTRIC MOTILITY STUDY  11/13/2007   mildly delayed emptying subjectively, but normal  analysis-77% of tracer emptied at 2 hours  . JOINT REPLACEMENT    . LAPAROSCOPIC APPENDECTOMY N/A 09/15/2012   Procedure: APPENDECTOMY LAPAROSCOPIC;  Surgeon: [name], MD;  Location: [facility] ORS;  Service: General;  Laterality: N/A;  . LEFT HEART CATHETERIZATION WITH CORONARY ANGIOGRAM N/A 01/05/2014   Procedure: LEFT HEART CATHETERIZATION WITH CORONARY ANGIOGRAM;  Surgeon: [name], MD;  Location: [facility] CATH LAB;  Service: Cardiovascular;  Laterality: N/A;  . REPLACEMENT TOTAL KNEE Right 05-03-06  . TEE WITHOUT CARDIOVERSION N/A 03/17/2014   Procedure: TRANSESOPHAGEAL ECHOCARDIOGRAM (TEE);  Surgeon: [name], MD;  Location: [facility] ENDOSCOPY;  Service: Cardiovascular;  Laterality: N/A;  . TONSILLECTOMY  1972   HPI:   Cassandra Adams is a 73 y.o. female with medical history significant of hypertension, hyperlipidemia, diabetes mellitus type 2, paroxysmal atrial fibrillation, coronary artery disease, history of TIA who presented to the ER with right-sided weakness.  Patient states she was doing well until around noon on 05/04/2019.  She says she had woken up at 4:30 in the morning when she was fine and had gone to the bathroom and then went back to sleep.  When she woke up at around noon she noted she had significant right-sided weakness mainly in the right lower extremity.  She did complain of some right upper extremity weakness as well. She also reports having decreased sensation on the right upper and lower extremity. She does have difficulty with speaking and has a very hoarse and soft voice and only is able to speak in whispers.  She was seen by ENT as an outpatient by Dr. Lorelee Cover and apparently had a laryngoscopy, reports not available to the patient says there may have been some issue with vocal cords.  She was referred to a specialist in [place] as per her.  She does have feeling of dysphagia with solid foods feeling that they get stuck in throat occasionally.  The symptoms are present since prior admission.Review of records shows patient was admitted from 02/26/2019-02/27/2019 with difficulty speaking, left-sided weakness and had an MRI of her brain at the time to rule out stroke which was noted to be negative.  She was thought to have possible TIA as well as concern for vocal cord dysfunction at the time. SLE ordered as part of stroke proticol.    Assessment / Plan / Recommendation Clinical Impression  Cognitive linguistic skills appear WNL, however Pt presents with suspected vocal cord dysfunction characterized by breathy/"whispered" speech. She reports that she has an appointment at [facility] next week (care everywhere reveals the same). SLP presented Pt with vocal fold unloading and cued spontaneous vocal elicitation  tasks with good success. Pt was successful in achieving voicing/phonation during yawn/sigh, straw phonation, humming, hand to larynx for tactile cue of voice on/off, and then carried over into automatic speech tasks (counting, months of the year). Pt was able to vocalize short sentences by the end of our session. Pt was given written cues to practice voice exercises until she can be seen at [facility]. SLP will see Pt during acute stay.    SLP Assessment  SLP Recommendation/Assessment: Patient needs continued Speech Lanaguage Pathology Services(Pt has an appointment at [facility] with Bubba Camp, SLP on Nov 18) SLP Visit Diagnosis: Aphonia (R49.1)    Follow Up Recommendations  Outpatient SLP    Frequency and Duration min 2x/week  1 week      SLP Evaluation Cognition  Overall Cognitive Status: Within Functional Limits for tasks assessed Arousal/Alertness: Awake/alert Orientation Level: Oriented X4 Memory: Appears intact Awareness: Appears intact Problem Solving: Appears intact Safety/Judgment: Appears intact       Comprehension  Auditory Comprehension Overall Auditory Comprehension: Appears within functional limits for tasks assessed Yes/No Questions: Within Functional Limits Commands: Within Functional Limits Conversation: Complex Visual Recognition/Discrimination Discrimination: Not tested Reading Comprehension Reading Status: Not tested    Expression Expression Primary Mode of Expression: Verbal(Pt initially presented as aphonic) Verbal Expression Overall Verbal Expression: Appears within functional limits for tasks assessed(dysphonia) Initiation: No impairment Automatic Speech: Name;Social Response;Month of year Level of Generative/Spontaneous Verbalization: Sentence  Repetition: No impairment Naming: No impairment Pragmatics: No impairment Interfering Components: Other (comment)(vocal cord dysfunction) Effective Techniques: (humming, vocal fold unloading) Non-Verbal Means of  Communication: Not applicable Written Expression Dominant Hand: Right Written Expression: Not tested   Oral / Motor  Oral Motor/Sensory Function Overall Oral Motor/Sensory Function: Within functional limits Motor Speech Overall Motor Speech: Impaired Respiration: Impaired Level of Impairment: Sentence Phonation: Aphonic;Other (comment) Resonance: Within functional limits Articulation: Within functional limitis Motor Planning: Witnin functional limits   Thank you,  Genene Churn, [place]                     [place] 05/05/2019, 10:09 PM

## 2019-05-06 ENCOUNTER — Inpatient Hospital Stay (HOSPITAL_COMMUNITY): Payer: Medicare HMO

## 2019-05-06 DIAGNOSIS — I4891 Unspecified atrial fibrillation: Secondary | ICD-10-CM | POA: Diagnosis present

## 2019-05-06 LAB — GLUCOSE, CAPILLARY
Glucose-Capillary: 309 mg/dL — ABNORMAL HIGH (ref 70–99)
Glucose-Capillary: 357 mg/dL — ABNORMAL HIGH (ref 70–99)
Glucose-Capillary: 322 mg/dL — ABNORMAL HIGH (ref 70–99)
Glucose-Capillary: 334 mg/dL — ABNORMAL HIGH (ref 70–99)

## 2019-05-06 LAB — ANA W/REFLEX IF POSITIVE: Anti Nuclear Antibody (ANA): NEGATIVE

## 2019-05-06 MED ORDER — CARVEDILOL 3.125 MG PO TABS
6.2500 mg | ORAL_TABLET | Freq: Two times a day (BID) | ORAL | Status: DC
  Administered 2019-05-06 – 2019-05-12 (×12): 6.25 mg via ORAL
  Filled 2019-05-06 (×13): qty 2

## 2019-05-06 MED ORDER — SODIUM CHLORIDE 0.9 % IV SOLN
500.0000 mg | Freq: Two times a day (BID) | INTRAVENOUS | Status: AC
  Administered 2019-05-07 – 2019-05-10 (×8): 500 mg via INTRAVENOUS
  Filled 2019-05-06 (×11): qty 4

## 2019-05-06 MED ORDER — INSULIN GLARGINE 100 UNIT/ML ~~LOC~~ SOLN
36.0000 [IU] | Freq: Every day | SUBCUTANEOUS | Status: DC
  Administered 2019-05-06: 23:00:00 36 [IU] via SUBCUTANEOUS
  Filled 2019-05-06 (×2): qty 0.36

## 2019-05-06 MED ORDER — HYDRALAZINE HCL 25 MG PO TABS
25.0000 mg | ORAL_TABLET | Freq: Three times a day (TID) | ORAL | Status: DC
  Administered 2019-05-06 – 2019-05-08 (×5): 25 mg via ORAL
  Filled 2019-05-06 (×5): qty 1

## 2019-05-06 MED ORDER — INSULIN ASPART 100 UNIT/ML ~~LOC~~ SOLN
4.0000 [IU] | Freq: Three times a day (TID) | SUBCUTANEOUS | Status: DC
  Administered 2019-05-07: 4 [IU] via SUBCUTANEOUS

## 2019-05-06 NOTE — Progress Notes (Signed)
PROGRESS NOTE  EMERALD SHOR URK:[id number] DOB: Oct 27, 1945 DOA: 05/04/2019 PCP: Redmond School, MD  Brief History:  73 year old female with a history of CKD stage III, diabetes mellitus type 2, hypertension, TIA, hyperlipidemia, coronary disease, paroxysmal atrial fibrillation, and TIA presenting with right leg weakness which she first noticed on 05/04/2019 around 4:30 AM.  The patient was walking back to her bedroom at that time.  She went back to sleep.  She got up around noontime on 05/04/2019 and still felt her right leg to be weak.  The patient also felt like she had some right upper extremity weakness with numbness and tingling in her right hand.  She denied any visual disturbance, headache, new focal extremity weakness.  She denies any dysarthria or word finding difficulty.  As result, the patient presented for further evaluation.  Notably, the patient recently had a hospitalization from 02/26/2019 through 02/27/2019 during which time she had left upper extremity weakness and difficulty with phonation.  MRI of the brain at that time was negative.  She subsequently followed up with ENT, Dr. Benjamine Mola.  Apparently, her vocal cords were felt to be normal, and the patient was referred to [facility].  The patient states that her phonation has not really changed.  She denies any headache, visual disturbance, fever, chills, chest pain, shortness breath, nausea, vomiting, diarrhea.  She denies any back pain, recent falls or injuries, or right leg pain. In the emergency department, the patient had low-grade temperature 99.4 F.  She was hemodynamically stable saturating 97% room air.  BMP, LFTs, and CBC were essentially unremarkable.  CT of the brain was negative for acute intracranial abnormalities.   Assessment/Plan: Right lower extremity weakness/sensory disturbance -MRI her lumbar spine--Canal and lateral recess stenosis are greatest at L3-L4. Right foraminal stenosis is greatest at L3-L4 and  L4-L5. -MRI T-spine-->Severe left foraminal stenosis at T10-11 which could affect the left T10 nerve. Ankylosis of the T10 and T11 vertebral bodies. -MRI-C-spine--Multilevel degenerative changes as detailed above, greatest at C5-C6 and C6-C7. -MRI-Brain--no acute infarct -Clinical exam reveals some functional components to the patient's deficit--supported by PT eval -Serum G31--517 -Folic OHYW--7.3 -XTG--626 -Urinalysis 21-50 WBC -Urine drug screen -TSH 0.276 -Free T4--0.85 -Discontinue Zanaflex -CRP<0.8 -ESR 30 -PT eval-->HHPT -05/06/19--case discussed with neurosurgery (Nundkumar)--continued nonoperative management--MRI findings do not support clinical presentation;  Follow up in office -appreciate neurology consult>>continue IV steroids  Dysphonia -05/06/19--back to normal -discussed with speech therapy-->likely functional  Uncontrolled diabetes mellitus type 2, with hyperglycemia -03/04/2019 hemoglobin A1c 9.8 -increase Lantus to 36 units -add novolog 4 units with meals -NovoLog sliding scale -Holding glipizide  Paroxysmal atrial fibrillation -Continue apixaban -Continue carvedilol -Rate controlled  CKD stage III -Baseline creatinine 1.0-1.3 -A.m. BMP  Hyperlipidemia -Continue statin and WelChol  Essential hypertension -Continue carvedilol-->increase to 6.25 mg bid -restart hydralazine        Disposition Plan:   Home in 1-2 days when cleared by neurology Family Communication:   No Family at bedside  Consultants:  neuro  Code Status:  FULL  DVT Prophylaxis:  apixaban   Procedures: As Listed in Progress Note Above  Antibiotics: None    Subjective: Pt able to phonate today.  She feels her right leg is improving.  Denies f/c, cp, sob, n/v/d, back pain or leg pain.  Objective: Vitals:   05/06/19 0915 05/06/19 1318 05/06/19 1718 05/06/19 1756  BP: (!) 172/83 (!) 186/77 (!) 229/78 (!) 197/84  Pulse: 62 63 72 73  Resp:  18 20  20   Temp:  98.4 F (36.9 C) 98 F (36.7 C)   TempSrc:  Oral Oral   SpO2:  99% 100% 100%  Weight:      Height:        Intake/Output Summary (Last 24 hours) at 05/06/2019 1840 Last data filed at 05/06/2019 1700 Gross per 24 hour  Intake 290 ml  Output 300 ml  Net -10 ml   Weight change:  Exam:   General:  Pt is alert, follows commands appropriately, not in acute distress  HEENT: No icterus, No thrush, No neck mass, [place]/AT  Cardiovascular: RRR, S1/S2, no rubs, no gallops  Respiratory: CTA bilaterally, no wheezing, no crackles, no rhonchi  Abdomen: Soft/+BS, non tender, non distended, no guarding  Extremities: No edema, No lymphangitis, No petechiae, No rashes, no synovitis   Data Reviewed: I have personally reviewed following labs and imaging studies Basic Metabolic Panel: Recent Labs  Lab 05/04/19 1709 05/05/19 0448  NA 139  --   K 4.4  --   CL 109  --   CO2 25  --   GLUCOSE 119*  --   BUN 22  --   CREATININE 1.13*  --   CALCIUM 9.9  --   MG  --  2.3   Liver Function Tests: Recent Labs  Lab 05/04/19 1709  AST 33  ALT 22  ALKPHOS 85  BILITOT 0.5  PROT 7.4  ALBUMIN 3.7   No results for input(s): LIPASE, AMYLASE in the last 168 hours. No results for input(s): AMMONIA in the last 168 hours. Coagulation Profile: Recent Labs  Lab 05/04/19 1709  INR 1.2   CBC: Recent Labs  Lab 05/04/19 1709  WBC 5.7  NEUTROABS 4.1  HGB 13.5  HCT 40.2  MCV 83.2  PLT 191   Cardiac Enzymes: Recent Labs  Lab 05/05/19 0448  CKTOTAL 147   BNP: Invalid input(s): POCBNP CBG: Recent Labs  Lab 05/05/19 1624 05/05/19 2051 05/06/19 0759 05/06/19 1107 05/06/19 1605  GLUCAP 163* 223* 309* 357* 322*   HbA1C: Recent Labs    05/04/19 1709  HGBA1C 8.3*   Urine analysis:    Component Value Date/Time   COLORURINE YELLOW 05/05/2019 0821   APPEARANCEUR CLEAR 05/05/2019 0821   LABSPEC 1.019 05/05/2019 0821   PHURINE 5.0 05/05/2019 0821   GLUCOSEU 150 (A)  05/05/2019 0821   HGBUR NEGATIVE 05/05/2019 0821   BILIRUBINUR NEGATIVE 05/05/2019 0821   KETONESUR NEGATIVE 05/05/2019 0821   PROTEINUR 30 (A) 05/05/2019 0821   UROBILINOGEN 0.2 09/15/2012 1010   NITRITE NEGATIVE 05/05/2019 0821   LEUKOCYTESUR LARGE (A) 05/05/2019 0821   Sepsis Labs: @LABRCNTIP (procalcitonin:4,lacticidven:4) ) Recent Results (from the past 240 hour(s))  SARS CORONAVIRUS 2 (Chanci Ojala 6-24 HRS) Nasopharyngeal Nasopharyngeal Swab     Status: None   Collection Time: 05/04/19  7:20 PM   Specimen: Nasopharyngeal Swab  Result Value Ref Range Status   SARS Coronavirus 2 NEGATIVE NEGATIVE Final    Comment: (NOTE) SARS-CoV-2 target nucleic acids are NOT DETECTED. The SARS-CoV-2 RNA is generally detectable in upper and lower respiratory specimens during the acute phase of infection. Negative results do not preclude SARS-CoV-2 infection, do not rule out co-infections with other pathogens, and should not be used as the sole basis for treatment or other patient management decisions. Negative results must be combined with clinical observations, patient history, and epidemiological information. The expected result is Negative. Fact Sheet for Patients: SugarRoll.be Fact Sheet for Healthcare Providers: https://www.woods-mathews.com/ This test  is not yet approved or cleared by the [place] and  has been authorized for detection and/or diagnosis of SARS-CoV-2 by FDA under an Emergency Use Authorization (EUA). This EUA will remain  in effect (meaning this test can be used) for the duration of the COVID-19 declaration under Section 56 4(b)(1) of the Act, 21 U.S.C. section 360bbb-3(b)(1), unless the authorization is terminated or revoked sooner. Performed at [facility], [place] [address], [place], [place] [address]      Scheduled Meds:  apixaban  5 mg Oral BID   aspirin EC  81 mg Oral Daily   atorvastatin  80 mg Oral QPM     carvedilol  3.125 mg Oral BID   colesevelam  1,875 mg Oral BID WC   insulin aspart  0-15 Units Subcutaneous TID WC   insulin glargine  15 Units Subcutaneous QHS   tiZANidine  4 mg Oral QHS   Continuous Infusions:  methylPREDNISolone (SOLU-MEDROL) injection 500 mg (05/06/19 0010)    Procedures/Studies: Dg Chest 2 View  Result Date: 04/12/2019 CLINICAL DATA:  Acute chest pain following fall.  Initial encounter. EXAM: CHEST - 2 VIEW COMPARISON:  None. FINDINGS: The cardiomediastinal silhouette is unremarkable. Elevated RIGHT hemidiaphragm again noted. There is no evidence of focal airspace disease, pulmonary edema, suspicious pulmonary nodule/mass, pleural effusion, or pneumothorax. No acute bony abnormalities are identified. IMPRESSION: No active cardiopulmonary disease. Electronically Signed   By: Margarette Canada M.D.   On: 04/12/2019 14:32   Dg Pelvis 1-2 Views  Result Date: 04/12/2019 CLINICAL DATA:  Acute pelvic pain following fall. Initial encounter. 01/14/2019 and prior radiographs EXAM: PELVIS - 1-2 VIEW COMPARISON:  None. FINDINGS: No acute fracture, subluxation or dislocation. Mild degenerative changes in both hips noted. A calcified fibroid is again identified. Degenerative changes in the LOWER lumbar spine again noted. IMPRESSION: No acute abnormality. Electronically Signed   By: Margarette Canada M.D.   On: 04/12/2019 14:34   Ct Head Wo Contrast  Result Date: 05/04/2019 CLINICAL DATA:  Possible stroke, weakness, right-sided weakness and numbness EXAM: CT HEAD WITHOUT CONTRAST TECHNIQUE: Contiguous axial images were obtained from the base of the skull through the vertex without intravenous contrast. COMPARISON:  04/12/2019 FINDINGS: Brain: No evidence of acute infarction, hemorrhage, hydrocephalus, extra-axial collection or mass lesion/mass effect. Vascular: No hyperdense vessel or unexpected calcification. Skull: Normal. Negative for fracture or focal lesion. Sinuses/Orbits: No acute  finding. Other: There is a soft tissue contusion of the left forehead, which is improved compared to examination dated 04/12/2019. IMPRESSION: 1.  No acute intracranial pathology. 2. There is a soft tissue contusion of the left forehead, which is improved compared to examination dated 04/12/2019. Electronically Signed   By: Eddie Candle M.D.   On: 05/04/2019 16:35   Ct Head Wo Contrast  Result Date: 04/12/2019 CLINICAL DATA:  Facial trauma. EXAM: CT HEAD WITHOUT CONTRAST CT MAXILLOFACIAL WITHOUT CONTRAST TECHNIQUE: Multidetector CT imaging of the head and maxillofacial structures were performed using the standard protocol without intravenous contrast. Multiplanar CT image reconstructions of the maxillofacial structures were also generated. COMPARISON:  February 26, 2019 head CT. FINDINGS: CT HEAD FINDINGS Brain: No subdural, epidural, or subarachnoid hemorrhage. Cerebellum, brainstem, and basal cisterns are normal. Ventricles are normal. No mass effect or midline shift. No acute cortical ischemia or infarct identified. Foci of high attenuation left basal ganglia are stable, consistent with calcifications. Vascular: No hyperdense vessel or unexpected calcification. Skull: Normal. Negative for fracture or focal lesion. Other: Soft tissue swelling  is seen in the left periorbital region, extending into the forehead with a hematoma. Left globe is intact. No other soft tissue abnormalities are noted. CT MAXILLOFACIAL FINDINGS Osseous: No fracture or mandibular dislocation. No destructive process. Orbits: The orbits are normal in appearance.  The globes are intact. Sinuses: Chronic sinus disease is seen in the right maxillary sinus. No acute sinus disease noted. No sinus wall fractures are identified. Mastoid air cells and middle ears are well aerated. Soft tissues: There is soft tissue swelling in the left periorbital region, extending into the forehead with a hematoma. Other soft tissues are unremarkable.  IMPRESSION: 1. Soft tissue swelling in the left periorbital region. Hematoma over the left forehead. 2. The left globe is intact. 3. No facial bone fractures or calvarial fractures noted. 4. No acute intracranial abnormalities are noted. Electronically Signed   By: Dorise Bullion III M.D   On: 04/12/2019 13:18   Mr Brain W Wo Contrast  Result Date: 05/05/2019 CLINICAL DATA:  Right-sided weakness EXAM: MRI HEAD WITHOUT AND WITH CONTRAST TECHNIQUE: Multiplanar, multiecho pulse sequences of the brain and surrounding structures were obtained without and with intravenous contrast. CONTRAST:  61m GADAVIST GADOBUTROL 1 MMOL/ML IV SOLN COMPARISON:  February 27, 2019 FINDINGS: Brain: There is no acute infarction or intracranial hemorrhage. There is no intracranial mass, mass effect, edema, hydrocephalus, or extra-axial fluid collection. Patchy T2 hyperintensity in the supratentorial white matter is nonspecific but may reflect stable mild chronic microvascular ischemic changes. Ventricles and sulci are normal in size. Vascular: Major vessel flow voids at the skull base are preserved. Skull and upper cervical spine: Marrow signal is within normal limits. Cervical spine is better evaluated on concurrent dedicated imaging. Sinuses/Orbits: Mild paranasal sinus mucosal thickening. Bilateral lens replacements. Other: Left frontal scalp hematoma as seen on prior CT imaging. Trace right mastoid fluid opacification. IMPRESSION: No evidence of acute infarction, intracranial hemorrhage, or mass. Stable mild chronic microvascular ischemic changes. Electronically Signed   By: PMacy MisM.D.   On: 05/05/2019 11:13   Mr Thoracic Spine Wo Contrast  Result Date: 05/06/2019 CLINICAL DATA:  Progressive ataxia. Upper back pain for 2 months. EXAM: MRI THORACIC SPINE WITHOUT CONTRAST TECHNIQUE: Multiplanar, multisequence MR imaging of the thoracic spine was performed. No intravenous contrast was administered. COMPARISON:  Chest  x-ray dated 04/12/2019 FINDINGS: Alignment: 2 mm retrolisthesis of T11 on T12. Alignment is otherwise normal. Vertebrae: Ankylosis of the T10 and T11 vertebral bodies. No fracture, evidence of discitis, or bone lesion. Cord:  Normal signal and morphology.  Tip of the conus is at L1. Paraspinal and other soft tissues: Negative. Disc levels: T1-2 through T6-7: No significant disc bulging or disc protrusion. No spinal or foraminal stenosis. T7-8: Tiny central disc bulge with no neural impingement. Otherwise negative. T8-9 and T9-10: Normal. T10-11: Marked disc space narrowing. Small broad-based endplate osteophytes symmetrically indent the ventral aspect of the thecal sac without focal neural impingement. There is severe left foraminal stenosis which could affect the left T10 nerve. There is ankylosis of the T10 and T11 vertebral bodies. T11-12: Marked disc space narrowing. Slight retrolisthesis. Small broad-based endplate osteophytes do not create significant spinal stenosis. No significant foraminal stenosis. T12-L1: Negative. IMPRESSION: 1. Severe left foraminal stenosis at T10-11 which could affect the left T10 nerve. 2. Ankylosis of the T10 and T11 vertebral bodies. 3. No other significant abnormalities. Specifically, no evidence to explain the patient's progressive ataxia. Electronically Signed   By: JLorriane ShireM.D.   On: 05/06/2019 09:57  Mr Cervical Spine W Wo Contrast  Result Date: 05/05/2019 CLINICAL DATA:  Radiculopathy, right-sided weakness EXAM: MRI CERVICAL SPINE WITHOUT AND WITH CONTRAST TECHNIQUE: Multiplanar and multiecho pulse sequences of the cervical spine, to include the craniocervical junction and cervicothoracic junction, were obtained without and with intravenous contrast. CONTRAST:  66m GADAVIST GADOBUTROL 1 MMOL/ML IV SOLN COMPARISON:  None. FINDINGS: Alignment: Straightening of the cervical lordosis. There is focal kyphosis at C5-C6. Vertebrae: Vertebral body heights are maintained  apart from anterior wedging and endplate irregularity at C5 and C6. There is no significant marrow edema. No suspicious osseous lesion. Cord: No abnormal cord signal. Posterior Fossa, vertebral arteries, paraspinal tissues: Unremarkable. Disc levels: C2-C3:  Disc bulge.  No significant canal or foraminal stenosis. C3-C4: Small central disc protrusion. Mild canal stenosis. No foraminal stenosis. C4-C5: Very small central disc protrusion. Mild facet and uncovertebral hypertrophy. No canal or foraminal stenosis. C5-C6: Right central disc extrusion extending below the disc level, endplate osteophytes, and facet and uncovertebral hypertrophy. Mild canal stenosis with flattening of the right ventral aspect of the cord. No foraminal stenosis. C6-C7: Disc bulge, endplate osteophytes, and facet and uncovertebral hypertrophy. Moderate canal stenosis. Marked foraminal stenosis, right greater than left. C7-T1:  Facet hypertrophy.  No canal or foraminal stenosis. IMPRESSION: Multilevel degenerative changes as detailed above, greatest at C5-C6 and C6-C7. Electronically Signed   By: PMacy MisM.D.   On: 05/05/2019 11:31   Mr Lumbar Spine W Wo Contrast  Result Date: 05/05/2019 CLINICAL DATA:  Radiculopathy, right-sided weakness EXAM: MRI LUMBAR SPINE WITHOUT AND WITH CONTRAST TECHNIQUE: Multiplanar and multiecho pulse sequences of the lumbar spine were obtained without and with intravenous contrast. CONTRAST:  718mGADAVIST GADOBUTROL 1 MMOL/ML IV SOLN COMPARISON:  None. FINDINGS: Motion artifact is present. Segmentation: For the purposes of this dictation, there are 5 lumbar type vertebral bodies with the caudal most designated L5. Alignment:  There is mild anterolisthesis at L3-L4. Vertebrae: Vertebral body heights are maintained apart from degenerative endplate irregularity and Schmorl's nodes, greatest at L1-L2 and L2-L3. There is degenerative endplate marrow edema at the levels as well. Degenerative marrow edema is  also present at the right L4-L5 facet joint. Conus medullaris and cauda equina: Conus extends to the L1 level. Conus and cauda equina appear normal. Paraspinal and other soft tissues: Unremarkable. Disc levels: L1-L2: Disc bulge and facet arthropathy with ligamentum flavum infolding. Mild canal stenosis. Mild to moderate right and mild left foraminal stenosis. L2-L3: Disc bulge and facet arthropathy (marked on the right) with ligamentum flavum infolding. Moderate canal stenosis with narrowing of the lateral recesses. Mild foraminal stenosis. L3-L4: Anterolisthesis with uncovering of disc bulge and marked facet arthropathy with ligamentum flavum infolding. Marked canal stenosis with narrowing of the lateral recesses. Moderate to marked right and mild left foraminal stenosis. L4-L5: Disc bulge with endplate osteophytic ridging. Moderate to marked facet arthropathy with joint effusion on the right and ligamentum flavum infolding. Moderate canal stenosis with narrowing of the lateral recesses. Moderate to marked foraminal stenosis. L5-S1: Small right far lateral disc protrusion. Moderate facet arthropathy. No canal stenosis. Mild right foraminal stenosis. No left foraminal stenosis. IMPRESSION: Multilevel degenerative changes as detailed above. Canal and lateral recess stenosis are greatest at L3-L4. Right foraminal stenosis is greatest at L3-L4 and L4-L5. Electronically Signed   By: PrMacy Mis.D.   On: 05/05/2019 11:44   Ct Maxillofacial Wo Contrast  Result Date: 04/12/2019 CLINICAL DATA:  Facial trauma. EXAM: CT HEAD WITHOUT CONTRAST CT MAXILLOFACIAL WITHOUT CONTRAST  TECHNIQUE: Multidetector CT imaging of the head and maxillofacial structures were performed using the standard protocol without intravenous contrast. Multiplanar CT image reconstructions of the maxillofacial structures were also generated. COMPARISON:  February 26, 2019 head CT. FINDINGS: CT HEAD FINDINGS Brain: No subdural, epidural, or  subarachnoid hemorrhage. Cerebellum, brainstem, and basal cisterns are normal. Ventricles are normal. No mass effect or midline shift. No acute cortical ischemia or infarct identified. Foci of high attenuation left basal ganglia are stable, consistent with calcifications. Vascular: No hyperdense vessel or unexpected calcification. Skull: Normal. Negative for fracture or focal lesion. Other: Soft tissue swelling is seen in the left periorbital region, extending into the forehead with a hematoma. Left globe is intact. No other soft tissue abnormalities are noted. CT MAXILLOFACIAL FINDINGS Osseous: No fracture or mandibular dislocation. No destructive process. Orbits: The orbits are normal in appearance.  The globes are intact. Sinuses: Chronic sinus disease is seen in the right maxillary sinus. No acute sinus disease noted. No sinus wall fractures are identified. Mastoid air cells and middle ears are well aerated. Soft tissues: There is soft tissue swelling in the left periorbital region, extending into the forehead with a hematoma. Other soft tissues are unremarkable. IMPRESSION: 1. Soft tissue swelling in the left periorbital region. Hematoma over the left forehead. 2. The left globe is intact. 3. No facial bone fractures or calvarial fractures noted. 4. No acute intracranial abnormalities are noted. Electronically Signed   By: Dorise Bullion III M.D   On: 04/12/2019 13:18    Orson Eva, DO  [facility] Pager [phone]  If 7PM-7AM, please contact night-coverage [email or web address] Password [email or web address] 05/06/2019, 6:40 PM   LOS: 1 day

## 2019-05-06 NOTE — Progress Notes (Signed)
Physical Therapy Note  Patient Details  Name: Cassandra Adams MRN: [id number] Date of Birth: Mar 27, 1946 Today's Date: 05/06/2019    Pt refused therapy today stating she was dizzy and had a headache  Teena Irani, PTA/CLT [place], Tamana Hatfield B 05/06/2019, 1:23 PM

## 2019-05-06 NOTE — Progress Notes (Signed)
Inpatient Diabetes Program Recommendations  AACE/ADA: New Consensus Statement on Inpatient Glycemic Control (2015)  Target Ranges:  Prepandial:   less than 140 mg/dL      Peak postprandial:   less than 180 mg/dL (1-2 hours)      Critically ill patients:  140 - 180 mg/dL   Lab Results  Component Value Date   GLUCAP 309 (H) 05/06/2019   HGBA1C 8.3 (H) 05/04/2019    Review of Glycemic Control Results for Cassandra Adams, Cassandra Adams (MRN [id number]) as of 05/06/2019 09:38  Ref. Range 05/05/2019 11:26 05/05/2019 16:24 05/05/2019 20:51 05/06/2019 07:59  Glucose-Capillary Latest Ref Range: 70 - 99 mg/dL 145 (H) 163 (H) 223 (H) 309 (H)   Diabetes history: Type 2 DM Outpatient Diabetes medications: Glipizide 5 mg QAM, Basaglar 36 units QHS, Humalog 10 units TID Current orders for Inpatient glycemic control: Lantus 15 units QHS, Novolog 0-15 units TID Solumedrol 500 mg QD  Inpatient Diabetes Program Recommendations:    Glucose trends increased in the setting of steroids.   Consider:  -increasing Lantus to 36 units QHS (patient's home dose) -Adding Novolog 4 units TID (assuming that patient is consuming >50% of meal).   Thanks, Bronson Curb, MSN, RNC-OB Diabetes Coordinator [phone] (8a-5p)

## 2019-05-06 NOTE — Progress Notes (Signed)
Vernal A. Merlene Laughter, MD     [email or web address]          Cassandra Adams is an 73 y.o. female.   Assessment/Plan: 1. Acute/subacute right lower extremity monoplegia with physical examination highly suggestive of a myelopathy.   The patient most likely has multifactorial causes including myelopathy and the multilevel radiculopathy. There is herniation at T10-T11 which encroaches spinal cord and also at T 11 T12.Lumbar spine MRI shows severe stenosis which can cause cordiequina syndrome. She has responded mildly to high-dose steroids. I think we should do is for 3-5 days. In fact, I will increase the dose to 500 mg twice a day. 2. Mild motor impairment of the right hand of unclear etiology. There is some suggestion that the patient may have carpal tunnel syndrome which could be the etiology. 3. Numbness and pain of the right hand which seems consistent with carpal tunnel syndrome. 4. Diabetic polyneuropathy. 5. Previous right hemiparesis of on clear etiology. 6. Hypophonia currently due to primarily vocal cord issues. No evidence of parkinsonian syndrome. The history and examination does not support a typical case of myasthenia gravis. However, I will obtain antibodies for myasthenia gravis. This will be the acetylcholine receptor antibodies. 7. Repeat falls likely due to 1.     The patient reports she still has right leg weakness. She is seems to have a mild improvement.      GENERAL:  Pleasant female is in no acute distress.  HEENT:  Neck is supple no trauma appreciated.  ABDOMEN: Soft  EXTREMITIES: No edema   BACK: Normal alignment.  SKIN: Normal by inspection.    MENTAL STATUS: Alert and oriented. Speech, language and cognition are generally intact. Judgment and insight normal.   CRANIAL NERVES: Pupils are equal, round and reactive to light and accommodation; extraocular movements are full, there is no significant nystagmus; upper and  lower facial muscles are normal in strength and symmetric including the obicularis oculi, there is no flattening of the nasolabial folds; tongue is midline; uvula is midline; shoulder elevation is normal.  MOTOR:  She has normal tone bulk and strength of the upper extremities. The right leg is severely spastic with 2/5 movement. The left leg is moderately spastic. Strength is graded as 4/5.  COORDINATION: Left finger to nose is normal, right finger to nose is normal, No rest tremor; no intention tremor; no postural tremor; no bradykinesia.  REFLEXES: Deep tendon reflexes are symmetrical and normal in the upper extremities but absent in the legs. Plantar responses are flexor bilaterally.   SENSATION: Normal to light touch and temperature.    Thoracic spine MRI shows marked herniated this T10/T11 and T11/T12. There is also anterolisthesis at T10-T11. At these levels there is encroachment on the spinal cord especially at T10-T11.  Objective: Vital signs in last 24 hours: Temp:  [98 F (36.7 C)-98.7 F (37.1 C)] 98 F (36.7 C) (11/10 1718) Pulse Rate:  [51-72] 72 (11/10 1718) Resp:  [18-20] 20 (11/10 1718) BP: (172-229)/(54-83) 229/78 (11/10 1718) SpO2:  [99 %-100 %] 100 % (11/10 1718)  Intake/Output from previous day: 11/09 0701 - 11/10 0700 In: 290 [P.O.:240; IV Piggyback:50] Out: -  Intake/Output this shift: Total I/O In: 240 [P.O.:240] Out: 300 [Urine:300] Nutritional status:  Diet Order            Diet heart healthy/carb modified Room service appropriate? Yes; Fluid consistency: Thin  Diet effective now  Lab Results: Results for orders placed or performed during the hospital encounter of 05/04/19 (from the past 48 hour(s))  SARS CORONAVIRUS 2 (TAT 6-24 HRS) Nasopharyngeal Nasopharyngeal Swab     Status: None   Collection Time: 05/04/19  7:20 PM   Specimen: Nasopharyngeal Swab  Result Value Ref Range   SARS Coronavirus 2 NEGATIVE NEGATIVE     Comment: (NOTE) SARS-CoV-2 target nucleic acids are NOT DETECTED. The SARS-CoV-2 RNA is generally detectable in upper and lower respiratory specimens during the acute phase of infection. Negative results do not preclude SARS-CoV-2 infection, do not rule out co-infections with other pathogens, and should not be used as the sole basis for treatment or other patient management decisions. Negative results must be combined with clinical observations, patient history, and epidemiological information. The expected result is Negative. Fact Sheet for Patients: SugarRoll.be Fact Sheet for Healthcare Providers: https://www.woods-mathews.com/ This test is not yet approved or cleared by the [place] FDA and  has been authorized for detection and/or diagnosis of SARS-CoV-2 by FDA under an Emergency Use Authorization (EUA). This EUA will remain  in effect (meaning this test can be used) for the duration of the COVID-19 declaration under Section 56 4(b)(1) of the Act, 21 U.S.C. section 360bbb-3(b)(1), unless the authorization is terminated or revoked sooner. Performed at [facility], [place] [address], [place], [place] [address]   Sedimentation rate     Status: Abnormal   Collection Time: 05/04/19 11:50 PM  Result Value Ref Range   Sed Rate 30 (H) 0 - 22 mm/hr    Comment: Performed at [facility], [address] [place], [place] [address]  C-reactive protein     Status: None   Collection Time: 05/04/19 11:50 PM  Result Value Ref Range   CRP <0.8 <1.0 mg/dL    Comment: Performed at [facility], [address], [place], [place] [address]  Vitamin B12     Status: None   Collection Time: 05/04/19 11:50 PM  Result Value Ref Range   Vitamin B-12 272 180 - 914 pg/mL    Comment: (NOTE) This assay is not validated for testing neonatal or myeloproliferative syndrome specimens for Vitamin B12 levels. Performed at [facility], [address]  [place], [place] [address]   ANA w/Reflex if Positive     Status: None   Collection Time: 05/04/19 11:50 PM  Result Value Ref Range   Anti Nuclear Antibody (ANA) Negative Negative    Comment: (NOTE) Performed At: [facility] [place], [place] [id number] Rush Farmer MD CB:[id number]   Lipid panel     Status: None   Collection Time: 05/04/19 11:50 PM  Result Value Ref Range   Cholesterol 128 0 - 200 mg/dL   Triglycerides 99 <150 mg/dL   HDL 51 >40 mg/dL   Total CHOL/HDL Ratio 2.5 RATIO   VLDL 20 0 - 40 mg/dL   LDL Cholesterol 57 0 - 99 mg/dL    Comment:        Total Cholesterol/HDL:CHD Risk Coronary Heart Disease Risk Table                     Men   Women  1/2 Average Risk   3.4   3.3  Average Risk       5.0   4.4  2 X Average Risk   9.6   7.1  3 X Average Risk  23.4   11.0        Use  the calculated Patient Ratio above and the CHD Risk Table to determine the patient's CHD Risk.        ATP III CLASSIFICATION (LDL):  <100     mg/dL   Optimal  100-129  mg/dL   Near or Above                    Optimal  130-159  mg/dL   Borderline  160-189  mg/dL   High  >190     mg/dL   Very High Performed at [facility], [address], [place], [place] [address]   Glucose, capillary     Status: Abnormal   Collection Time: 05/05/19 12:53 AM  Result Value Ref Range   Glucose-Capillary 130 (H) 70 - 99 mg/dL   Comment 1 Notify RN    Comment 2 Document in Chart   T4, free     Status: None   Collection Time: 05/05/19  4:48 AM  Result Value Ref Range   Free T4 0.85 0.61 - 1.12 ng/dL    Comment: (NOTE) Biotin ingestion may interfere with free T4 tests. If the results are inconsistent with the TSH level, previous test results, or the clinical presentation, then consider biotin interference. If needed, order repeat testing after stopping biotin. Performed at [facility], [place] [address] [place], [place] [address]   Folate     Status: None   Collection Time:  05/05/19  4:48 AM  Result Value Ref Range   Folate 9.2 >5.9 ng/mL    Comment: Performed at [facility], [address] [place], [place] [address]  Magnesium     Status: None   Collection Time: 05/05/19  4:48 AM  Result Value Ref Range   Magnesium 2.3 1.7 - 2.4 mg/dL    Comment: Performed at [facility], [address], [place], [place] [address]  CK     Status: None   Collection Time: 05/05/19  4:48 AM  Result Value Ref Range   Total CK 147 38 - 234 U/L    Comment: Performed at [facility], [address] [place], [place] [address]  Glucose, capillary     Status: Abnormal   Collection Time: 05/05/19  7:43 AM  Result Value Ref Range   Glucose-Capillary 141 (H) 70 - 99 mg/dL  Urinalysis, Complete w Microscopic     Status: Abnormal   Collection Time: 05/05/19  8:21 AM  Result Value Ref Range   Color, Urine YELLOW YELLOW   APPearance CLEAR CLEAR   Specific Gravity, Urine 1.019 1.005 - 1.030   pH 5.0 5.0 - 8.0   Glucose, UA 150 (A) NEGATIVE mg/dL   Hgb urine dipstick NEGATIVE NEGATIVE   Bilirubin Urine NEGATIVE NEGATIVE   Ketones, ur NEGATIVE NEGATIVE mg/dL   Protein, ur 30 (A) NEGATIVE mg/dL   Nitrite NEGATIVE NEGATIVE   Leukocytes,Ua LARGE (A) NEGATIVE   RBC / HPF 11-20 0 - 5 RBC/hpf   WBC, UA 21-50 0 - 5 WBC/hpf   Bacteria, UA RARE (A) NONE SEEN   Squamous Epithelial / LPF 6-10 0 - 5   Mucus PRESENT     Comment: Performed at [facility], [address] [place],  [address]  Urine rapid drug screen (hosp performed)     Status: None   Collection Time: 05/05/19  8:21 AM  Result Value Ref Range   Opiates NONE DETECTED NONE DETECTED   Cocaine NONE DETECTED NONE DETECTED   Benzodiazepines NONE DETECTED NONE DETECTED  Amphetamines NONE DETECTED NONE DETECTED   Tetrahydrocannabinol NONE DETECTED NONE DETECTED   Barbiturates NONE DETECTED NONE DETECTED    Comment: (NOTE) DRUG SCREEN FOR MEDICAL PURPOSES ONLY.  IF CONFIRMATION IS NEEDED FOR ANY PURPOSE, NOTIFY LAB  WITHIN 5 DAYS. LOWEST DETECTABLE LIMITS FOR URINE DRUG SCREEN Drug Class                     Cutoff (ng/mL) Amphetamine and metabolites    1000 Barbiturate and metabolites    200 Benzodiazepine                 240 Tricyclics and metabolites     300 Opiates and metabolites        300 Cocaine and metabolites        300 THC                            50 Performed at [facility], [address] [place], [place] [address]   Glucose, capillary     Status: Abnormal   Collection Time: 05/05/19 11:26 AM  Result Value Ref Range   Glucose-Capillary 145 (H) 70 - 99 mg/dL  Glucose, capillary     Status: Abnormal   Collection Time: 05/05/19  4:24 PM  Result Value Ref Range   Glucose-Capillary 163 (H) 70 - 99 mg/dL   Comment 1 Notify RN    Comment 2 Document in Chart   Glucose, capillary     Status: Abnormal   Collection Time: 05/05/19  8:51 PM  Result Value Ref Range   Glucose-Capillary 223 (H) 70 - 99 mg/dL   Comment 1 Notify RN    Comment 2 Document in Chart   Glucose, capillary     Status: Abnormal   Collection Time: 05/06/19  7:59 AM  Result Value Ref Range   Glucose-Capillary 309 (H) 70 - 99 mg/dL  Glucose, capillary     Status: Abnormal   Collection Time: 05/06/19 11:07 AM  Result Value Ref Range   Glucose-Capillary 357 (H) 70 - 99 mg/dL  Glucose, capillary     Status: Abnormal   Collection Time: 05/06/19  4:05 PM  Result Value Ref Range   Glucose-Capillary 322 (H) 70 - 99 mg/dL    Lipid Panel Recent Labs    05/04/19 2350  CHOL 128  TRIG 99  HDL 51  CHOLHDL 2.5  VLDL 20  LDLCALC 57    Studies/Results:   Medications:  Scheduled Meds: . apixaban  5 mg Oral BID  . aspirin EC  81 mg Oral Daily  . atorvastatin  80 mg Oral QPM  . carvedilol  3.125 mg Oral BID  . colesevelam  1,875 mg Oral BID WC  . insulin aspart  0-15 Units Subcutaneous TID WC  . insulin glargine  15 Units Subcutaneous QHS  . tiZANidine  4 mg Oral QHS   Continuous Infusions: .  methylPREDNISolone (SOLU-MEDROL) injection 500 mg (05/06/19 0010)   PRN Meds:.acetaminophen, hydrALAZINE, nitroGLYCERIN, ondansetron (ZOFRAN) IV, senna-docusate     LOS: 1 day   Jorma Tassinari A. Merlene Laughter, M.D.  Diplomate, Tax adviser of Psychiatry and Neurology ( Neurology).

## 2019-05-06 NOTE — Care Management (Signed)
  .   A rating of 4 or more stars means the agency performed better than most other agencies on selected measures. . A rating of 3 to 3 stars means that the agency performed about the same as most agencies. . A rating of fewer than 3 stars means that the agency's performance was below the average of other agencies on selected measures." class="info" href="javascript:void(0)". A rating of 4 or more stars means the agency performed better than most other agencies on selected measures. . A rating of 3 to 3 stars means that the agency performed about the same as most agencies. . A rating of fewer than 3 stars means that the agency's performance was below the average of other agencies on selected measures." class="info" href="javascript:void(0)"   HOME HEALTH AGENCIES:   [place] [phone]   [place] to my Favorites Quality of Patient Care Rating3 out of 5 stars Patient Survey Summary Rating5 out of [place] [phone]   [place] to my Favorites Quality of Patient Care Rating2  out of 5 stars Patient Survey Summary Rating4 out of [place] [phone]   [place] to my Favorites Quality of Patient Care Rating4  out of 5 stars Patient Survey Summary Rating5 out of [place] [phone]   [place] to my [place]  out of 5 stars Patient Survey Summary Rating4 out of [place] [phone]   [place] to my Favorites Quality of Patient Care Rating4  out of 5 stars Patient Survey Summary Rating5 out of 5 stars  [place] [phone]   [place] to my Favorites Quality of Patient Care Rating4 out of 5 stars Patient Survey Summary Rating4 out of [place] [phone]   [place] to my Favorites Quality of Patient Care Rating4 out of 5 stars Patient Survey Summary Rating4 out of [place] AGE [phone]   [place] AGE to my Favorites Quality of Patient Care Rating2  out of 5 stars Patient Survey Summary Rating5 out of 5 stars  [facility] [place] [phone]   Add [facility] [place] to my Favorites Quality of Patient Care Rating3  out of 5 stars Patient Survey Summary Rating4 out of [place] [phone]   [place] to my Favorites Quality of Patient Care Rating3 out of 5 stars Patient Survey Summary Rating4 out of 5 stars  [facility] [phone]   Add [facility] to my Favorites Quality of Patient Care Rating4  out of 5 stars Patient Survey Summary Rating3 out of [place] [phone]   [place] to my Favorites Quality of Patient Care Rating

## 2019-05-06 NOTE — Progress Notes (Signed)
  Speech Language Pathology Treatment: Cognitive-Linquistic  Patient Details Name: Cassandra Adams MRN: [id number] DOB: Sep 16, 1945 Today's Date: 05/06/2019 Time: 0254-2706 SLP Time Calculation (min) (ACUTE ONLY): 24 min  Assessment / Plan / Recommendation Clinical Impression  Pt seen in room for ongoing voice therapy. SLP provided demonstration of vocal fold unloading tasks and voicing via yawn sigh, humming, tactile feedback, and easy onset. Pt was able to achieve phonation on 90% of tasks and in spontaneous conversation throughout the session. SLP provided emotional support and encouragement on ways to achieve and maintain voicing through written exercises provided. Pt may wish to still pursue follow up with her appointment scheduled next week with Cassandra Adams, SLP at [facility]. She is also welcome to pursue outpatient therapy in [place] if she desires. Pt was appreciative of visit.    HPI HPI: Cassandra Adams is a 73 y.o. female with medical history significant of hypertension, hyperlipidemia, diabetes mellitus type 2, paroxysmal atrial fibrillation, coronary artery disease, history of TIA who presented to the ER with right-sided weakness.  Patient states she was doing well until around noon on 05/04/2019.  She says she had woken up at 4:30 in the morning when she was fine and had gone to the bathroom and then went back to sleep.  When she woke up at around noon she noted she had significant right-sided weakness mainly in the right lower extremity.  She did complain of some right upper extremity weakness as well. She also reports having decreased sensation on the right upper and lower extremity. She does have difficulty with speaking and has a very hoarse and soft voice and only is able to speak in whispers.  She was seen by ENT as an outpatient by Cassandra Adams and apparently had a laryngoscopy, reports not available to the patient says there may have been some issue with vocal cords.  She was  referred to a specialist in [place] as per her.  She does have feeling of dysphagia with solid foods feeling that they get stuck in throat occasionally.  The symptoms are present since prior admission.Review of records shows patient was admitted from 02/26/2019-02/27/2019 with difficulty speaking, left-sided weakness and had an MRI of her brain at the time to rule out stroke which was noted to be negative.  She was thought to have possible TIA as well as concern for vocal cord dysfunction at the time. SLE ordered as part of stroke proticol.       SLP Plan  Discharge SLP treatment due to (comment)       Recommendations                   Plan: Discharge SLP treatment due to (comment)       Thank you,  Cassandra Adams, [place]                 [place] 05/06/2019, 6:17 PM

## 2019-05-06 NOTE — TOC Initial Note (Signed)
Transition of Care [facility]) - Initial/Assessment Note    Patient Details  Name: Cassandra Adams MRN: [id number] Date of Birth: Jul 28, 1945  Transition of Care [facility]) CM/SW Contact:    Christe Tellez, Chauncey [place], RN Phone Number: 05/06/2019, 11:27 AM  Clinical Narrative:        Right lower extremity weakness/sensory disturbance. From home alone, walks with cane, has RW also. Daughter and sister live closeby and are supportive. Seen by PT with recommendations for home health PT and SLP with recommendations for outpatient SLP. Patient agreeable, elects home health vs outpatient.  No preference on providers, will refer to agency who can accept insurance.   Referral called to [facility].   Patient to have MRI 11/11.   TOC to follow and make necessary referrals.    Expected Discharge Plan: [place] Barriers to Discharge: Continued Medical Work up   Patient Goals and CMS Choice Patient states their goals for this hospitalization and ongoing recovery are:: feel better and return home CMS Medicare.gov Compare Post Acute Care list provided to:: Patient Choice offered to / list presented to : Patient  Expected Discharge Plan and Services Expected Discharge Plan: [place]   Discharge Planning Services: CM Consult Post Acute Care Choice: [place] arrangements for the past 2 months: Single Family Home                           HH Arranged: PT, Speech Therapy HH Agency: [facility] Date [place]: 05/06/19 Time [place]: 1125 Representative spoke with at [place]: [place] Arrangements/Services Living arrangements for the past 2 months: [place] Lives with:: Self              Current home services: DME(2 cane, straight and quad, rw)    Activities of Daily Living Home Assistive Devices/Equipment: Blood pressure cuff, Cane (specify quad or straight), CBG Meter, Dentures (specify  type), Eyeglasses, Walker (specify type) ADL Screening (condition at time of admission) Patient's cognitive ability adequate to safely complete daily activities?: Yes Is the patient deaf or have difficulty hearing?: No Does the patient have difficulty seeing, even when wearing glasses/contacts?: No Does the patient have difficulty concentrating, remembering, or making decisions?: No Patient able to express need for assistance with ADLs?: Yes(Use of notes) Does the patient have difficulty dressing or bathing?: No Independently performs ADLs?: No Communication: Independent Dressing (OT): Independent Grooming: Independent Feeding: Independent Bathing: Needs assistance Is this a change from baseline?: Change from baseline, expected to last <3 days Toileting: Needs assistance Is this a change from baseline?: Change from baseline, expected to last <3 days In/Out Bed: Needs assistance Is this a change from baseline?: Change from baseline, expected to last <3 days Walks in Home: Independent with device (comment)(has cane and walker) Does the patient have difficulty walking or climbing stairs?: Yes Weakness of Legs: Right Weakness of Arms/Hands: Right      Orientation: : Oriented to Self, Oriented to Place, Oriented to  Time      Admission diagnosis:  Atrial fibrillation with RVR ([place]) [I48.91] Essential hypertension [I10] Cerebrovascular accident (CVA), unspecified mechanism ([place]) [I63.9] Patient Active Problem List   Diagnosis Date Noted  . Right hemiparesis ([place]) 05/05/2019  . Uncontrolled type 2 diabetes mellitus with hyperglycemia ([place]) 05/05/2019  . CKD stage 3 due to type 2 diabetes mellitus ([place]) 05/05/2019  . Left-sided weakness 05/04/2019  . Aphasia 02/26/2019  .  DM type 2 causing vascular disease ([place]) 06/11/2017  . Type 2 diabetes mellitus with stage 4 chronic kidney disease, with long-term current use of insulin ([place]) 06/11/2017  . Carotid stenosis 02/05/2017  . TIA  (transient ischemic attack) 01/20/2017  . Carotid stenosis, right 01/20/2017  . Diabetic hyperosmolar non-ketotic state ([place]) 01/19/2017  . AKI (acute kidney injury) ([place]) 01/19/2017  . Left leg weakness 01/19/2017  . Dyslipidemia 11/24/2016  . Edema 03/30/2016  . Diaphoresis 02/18/2016  . Hypoglycemia 02/18/2016  . Bradycardia 04/22/2015  . Coronary artery disease due to lipid rich plaque 02/26/2014  . PAF (paroxysmal atrial fibrillation) ([place]) 02/26/2014  . NSTEMI (non-ST elevated myocardial infarction) ([place]) 01/03/2014  . Occlusion and stenosis of carotid artery without mention of cerebral infarction 11/21/2013  . Pain in limb-Left neck 11/21/2013  . Carotid stenosis, bilateral 11/17/2011  . HYPERTHYROIDISM, SUBCLINICAL 02/15/2010  . GERD 02/15/2010  . DYSPHAGIA UNSPECIFIED 02/15/2010  . ABDOMINAL PAIN, UNSPECIFIED SITE 02/15/2010  . Controlled insulin-dependent diabetes mellitus with neuropathy 02/11/2010  . Essential hypertension, benign 02/11/2010  . HEADACHE 02/11/2010   PCP:  Redmond School, MD Pharmacy:   [id number] [place], [place] AT [address] [place] [address] Phone: [phone] Fax: [phone]     Social Determinants of Health (SDOH) Interventions    Readmission Risk Interventions No flowsheet data found.

## 2019-05-06 NOTE — Progress Notes (Signed)
OT Cancellation Note  Patient Details Name: Cassandra Adams MRN: [id number] DOB: 1945-08-16   Cancelled Treatment:    Reason Eval/Treat Not Completed: Other (comment). Attempted evaluation this am. Pt reports nausea and vomiting this am, requested OT to come back at a later time. Pt did ask for assistance with teeth brushing, OT provided set-up at bed level, pt able to open packaging, prepare toothbrush, and brush teeth/gums without difficulty. Pt again requested OT to return later for evaluation. Will check back on pt tomorrow am.   Guadelupe Sabin, OTR/L  [phone] 05/06/2019, 8:11 AM

## 2019-05-07 DIAGNOSIS — E785 Hyperlipidemia, unspecified: Secondary | ICD-10-CM

## 2019-05-07 DIAGNOSIS — I4891 Unspecified atrial fibrillation: Secondary | ICD-10-CM

## 2019-05-07 DIAGNOSIS — E1165 Type 2 diabetes mellitus with hyperglycemia: Secondary | ICD-10-CM

## 2019-05-07 DIAGNOSIS — I10 Essential (primary) hypertension: Secondary | ICD-10-CM

## 2019-05-07 DIAGNOSIS — E1122 Type 2 diabetes mellitus with diabetic chronic kidney disease: Secondary | ICD-10-CM

## 2019-05-07 DIAGNOSIS — Z794 Long term (current) use of insulin: Secondary | ICD-10-CM

## 2019-05-07 DIAGNOSIS — N184 Chronic kidney disease, stage 4 (severe): Secondary | ICD-10-CM

## 2019-05-07 DIAGNOSIS — I6523 Occlusion and stenosis of bilateral carotid arteries: Secondary | ICD-10-CM

## 2019-05-07 DIAGNOSIS — R29898 Other symptoms and signs involving the musculoskeletal system: Secondary | ICD-10-CM

## 2019-05-07 DIAGNOSIS — N183 Chronic kidney disease, stage 3 unspecified: Secondary | ICD-10-CM

## 2019-05-07 DIAGNOSIS — I480 Paroxysmal atrial fibrillation: Secondary | ICD-10-CM

## 2019-05-07 LAB — BASIC METABOLIC PANEL
Anion gap: 9 (ref 5–15)
BUN: 36 mg/dL — ABNORMAL HIGH (ref 8–23)
CO2: 22 mmol/L (ref 22–32)
Calcium: 9.9 mg/dL (ref 8.9–10.3)
Chloride: 105 mmol/L (ref 98–111)
Creatinine, Ser: 1.34 mg/dL — ABNORMAL HIGH (ref 0.44–1.00)
GFR calc Af Amer: 45 mL/min — ABNORMAL LOW (ref >60)
GFR calc non Af Amer: 39 mL/min — ABNORMAL LOW (ref >60)
Glucose, Bld: 394 mg/dL — ABNORMAL HIGH (ref 70–99)
Potassium: 4.6 mmol/L (ref 3.5–5.1)
Sodium: 136 mmol/L (ref 135–145)

## 2019-05-07 LAB — URINE CULTURE

## 2019-05-07 LAB — GLUCOSE, CAPILLARY
Glucose-Capillary: 336 mg/dL — ABNORMAL HIGH (ref 70–99)
Glucose-Capillary: 492 mg/dL — ABNORMAL HIGH (ref 70–99)
Glucose-Capillary: 426 mg/dL — ABNORMAL HIGH (ref 70–99)
Glucose-Capillary: 245 mg/dL — ABNORMAL HIGH (ref 70–99)

## 2019-05-07 LAB — CK TOTAL AND CKMB (NOT AT ARMC)
CK, MB: 3.9 ng/mL (ref 0.5–5.0)
Relative Index: 3.6 — ABNORMAL HIGH (ref 0.0–2.5)
Total CK: 108 U/L (ref 38–234)

## 2019-05-07 MED ORDER — AMLODIPINE BESYLATE 5 MG PO TABS
5.0000 mg | ORAL_TABLET | Freq: Every day | ORAL | Status: DC
  Administered 2019-05-07: 5 mg via ORAL
  Filled 2019-05-07: qty 1

## 2019-05-07 MED ORDER — INSULIN ASPART 100 UNIT/ML ~~LOC~~ SOLN
20.0000 [IU] | Freq: Once | SUBCUTANEOUS | Status: AC
  Administered 2019-05-07: 20 [IU] via SUBCUTANEOUS

## 2019-05-07 MED ORDER — INSULIN GLARGINE 100 UNIT/ML ~~LOC~~ SOLN
45.0000 [IU] | Freq: Every day | SUBCUTANEOUS | Status: DC
  Filled 2019-05-07: qty 0.45

## 2019-05-07 MED ORDER — INSULIN ASPART 100 UNIT/ML ~~LOC~~ SOLN
0.0000 [IU] | Freq: Three times a day (TID) | SUBCUTANEOUS | Status: DC
  Administered 2019-05-08 (×2): 7 [IU] via SUBCUTANEOUS
  Administered 2019-05-08 – 2019-05-09 (×2): 11 [IU] via SUBCUTANEOUS
  Administered 2019-05-09: 09:00:00 3 [IU] via SUBCUTANEOUS
  Administered 2019-05-09: 12:00:00 15 [IU] via SUBCUTANEOUS
  Administered 2019-05-10 (×2): 3 [IU] via SUBCUTANEOUS
  Administered 2019-05-10: 4 [IU] via SUBCUTANEOUS

## 2019-05-07 MED ORDER — INSULIN ASPART 100 UNIT/ML ~~LOC~~ SOLN
0.0000 [IU] | Freq: Every day | SUBCUTANEOUS | Status: DC
  Administered 2019-05-07: 2 [IU] via SUBCUTANEOUS

## 2019-05-07 MED ORDER — PANTOPRAZOLE SODIUM 40 MG PO TBEC
40.0000 mg | DELAYED_RELEASE_TABLET | Freq: Two times a day (BID) | ORAL | Status: DC
  Administered 2019-05-07 – 2019-05-12 (×11): 40 mg via ORAL
  Filled 2019-05-07 (×12): qty 1

## 2019-05-07 MED ORDER — INSULIN ASPART 100 UNIT/ML ~~LOC~~ SOLN: 8.0000 [IU] | Freq: Three times a day (TID) | SUBCUTANEOUS | Status: DC

## 2019-05-07 MED ORDER — INSULIN ASPART 100 UNIT/ML ~~LOC~~ SOLN
14.0000 [IU] | Freq: Three times a day (TID) | SUBCUTANEOUS | Status: DC
  Administered 2019-05-07 – 2019-05-08 (×3): 14 [IU] via SUBCUTANEOUS

## 2019-05-07 MED ORDER — INSULIN GLARGINE 100 UNIT/ML ~~LOC~~ SOLN
55.0000 [IU] | Freq: Every day | SUBCUTANEOUS | Status: DC
  Administered 2019-05-07: 55 [IU] via SUBCUTANEOUS
  Filled 2019-05-07 (×2): qty 0.55

## 2019-05-07 NOTE — Progress Notes (Signed)
Hudspeth A. Merlene Laughter, MD     [email or web address]          Cassandra Adams is an 73 y.o. female.   Assessment/Plan: 1. Acute/subacute right lower extremity monoplegia with physical examination highly suggestive of a myelopathy.   The patient most likely has multifactorial causes including myelopathy and the multilevel radiculopathy.  There appears to be modest improvement so far.  We will continue steroids for another 2 to 3 days.  She will need long-term physical therapy. 2. Mild motor impairment of the right hand of unclear etiology. There is some suggestion that the patient may have carpal tunnel syndrome which could be the etiology. 3. Numbness and pain of the right hand which seems consistent with carpal tunnel syndrome. 4. Diabetic polyneuropathy. 5. Previous right hemiparesis of on clear etiology. 6. Hypophonia currently due to primarily vocal cord issues. No evidence of parkinsonian syndrome. The history and examination does not support a typical case of myasthenia gravis. However, I will obtain antibodies for myasthenia gravis. This will be the acetylcholine receptor antibodies. 7. Repeat falls likely due to 1.     She reports improvement in the symptoms involving the legs.  She is complaining of some headaches.     GENERAL:  Pleasant female is in no acute distress.  HEENT:  Neck is supple no trauma appreciated.  ABDOMEN: Soft  EXTREMITIES: No edema   BACK: Normal alignment.  SKIN: Normal by inspection.    MENTAL STATUS: Alert and oriented. Speech, language and cognition are generally intact. Judgment and insight normal.   CRANIAL NERVES: Pupils are equal, round and reactive to light and accommodation; extraocular movements are full, there is no significant nystagmus; upper and lower facial muscles are normal in strength and symmetric including the obicularis oculi, there is no flattening of the nasolabial folds; tongue is midline;  uvula is midline; shoulder elevation is normal.  MOTOR:  She has normal tone bulk and strength of the upper extremities. The right leg is severely spastic with she barely lifts the leg of the bed making 3/5. The left leg is no longer spastic. Strength is graded as 4/5.  COORDINATION: Left finger to nose is normal, right finger to nose is normal, No rest tremor; no intention tremor; no postural tremor; no bradykinesia.  REFLEXES: Deep tendon reflexes are symmetrical and normal in the upper extremities but absent in the legs. Plantar responses are flexor bilaterally.   SENSATION: Normal to light touch and temperature.    Thoracic spine MRI shows marked herniated this T10/T11 and T11/T12. There is also anterolisthesis at T10-T11. At these levels there is encroachment on the spinal cord especially at T10-T11.  Objective: Vital signs in last 24 hours: Temp:  [97.7 F (36.5 C)-98.9 F (37.2 C)] 97.7 F (36.5 C) (11/11 1409) Pulse Rate:  [62-72] 67 (11/11 1751) Resp:  [16-20] 20 (11/11 1409) BP: (158-178)/(60-85) 173/60 (11/11 1751) SpO2:  [97 %-100 %] 100 % (11/11 1409)  Intake/Output from previous day: 11/10 0701 - 11/11 0700 In: 410.3 [P.O.:360; IV Piggyback:50.3] Out: 600 [Urine:600] Intake/Output this shift: No intake/output data recorded. Nutritional status:  Diet Order            Diet heart healthy/carb modified Room service appropriate? Yes; Fluid consistency: Thin  Diet effective now               Lab Results: Results for orders placed or performed during the hospital encounter of 05/04/19 (from the past 48 hour(s))  Glucose, capillary  Status: Abnormal   Collection Time: 05/05/19  8:51 PM  Result Value Ref Range   Glucose-Capillary 223 (H) 70 - 99 mg/dL   Comment 1 Notify RN    Comment 2 Document in Chart   Glucose, capillary     Status: Abnormal   Collection Time: 05/06/19  7:59 AM  Result Value Ref Range   Glucose-Capillary 309 (H) 70 - 99 mg/dL   Glucose, capillary     Status: Abnormal   Collection Time: 05/06/19 11:07 AM  Result Value Ref Range   Glucose-Capillary 357 (H) 70 - 99 mg/dL  Glucose, capillary     Status: Abnormal   Collection Time: 05/06/19  4:05 PM  Result Value Ref Range   Glucose-Capillary 322 (H) 70 - 99 mg/dL  Glucose, capillary     Status: Abnormal   Collection Time: 05/06/19  9:45 PM  Result Value Ref Range   Glucose-Capillary 334 (H) 70 - 99 mg/dL  CK total and CKMB (cardiac)not at [facility]     Status: Abnormal   Collection Time: 05/06/19 10:44 PM  Result Value Ref Range   Total CK 108 38 - 234 U/L    Comment: Performed at [facility], [address] [place], [place] [address]   CK, MB 3.9 0.5 - 5.0 ng/mL   Relative Index 3.6 (H) 0.0 - 2.5    Comment: Performed at [facility] [address], [place], [place] [address]  Basic metabolic panel     Status: Abnormal   Collection Time: 05/07/19  5:54 AM  Result Value Ref Range   Sodium 136 135 - 145 mmol/L   Potassium 4.6 3.5 - 5.1 mmol/L   Chloride 105 98 - 111 mmol/L   CO2 22 22 - 32 mmol/L   Glucose, Bld 394 (H) 70 - 99 mg/dL   BUN 36 (H) 8 - 23 mg/dL   Creatinine, Ser 1.34 (H) 0.44 - 1.00 mg/dL   Calcium 9.9 8.9 - 10.3 mg/dL   GFR calc non Af Amer 39 (L) >60 mL/min   GFR calc Af Amer 45 (L) >60 mL/min   Anion gap 9 5 - 15    Comment: Performed at [facility], [address], [place], [place] [address]  Glucose, capillary     Status: Abnormal   Collection Time: 05/07/19  7:56 AM  Result Value Ref Range   Glucose-Capillary 336 (H) 70 - 99 mg/dL  Glucose, capillary     Status: Abnormal   Collection Time: 05/07/19  2:01 PM  Result Value Ref Range   Glucose-Capillary 492 (H) 70 - 99 mg/dL  Glucose, capillary     Status: Abnormal   Collection Time: 05/07/19  3:47 PM  Result Value Ref Range   Glucose-Capillary 426 (H) 70 - 99 mg/dL    Lipid Panel Recent Labs    05/04/19 2350  CHOL 128  TRIG 99  HDL 51  CHOLHDL 2.5  VLDL 20  LDLCALC  57    Studies/Results:   Medications:  Scheduled Meds: . amLODipine  5 mg Oral Daily  . apixaban  5 mg Oral BID  . aspirin EC  81 mg Oral Daily  . atorvastatin  80 mg Oral QPM  . carvedilol  6.25 mg Oral BID WC  . colesevelam  1,875 mg Oral BID WC  . hydrALAZINE  25 mg Oral Q8H  . insulin aspart  0-20 Units Subcutaneous TID WC  . insulin aspart  0-5 Units Subcutaneous QHS  . insulin aspart  14 Units Subcutaneous TID WC  . insulin glargine  55 Units Subcutaneous QHS  . pantoprazole  40 mg Oral BID  . tiZANidine  4 mg Oral QHS   Continuous Infusions: . methylPREDNISolone (SOLU-MEDROL) injection 500 mg (05/07/19 1110)   PRN Meds:.acetaminophen, hydrALAZINE, nitroGLYCERIN, ondansetron (ZOFRAN) IV, senna-docusate     LOS: 2 days   Naleigha Raimondi A. Merlene Laughter, M.D.  Diplomate, Tax adviser of Psychiatry and Neurology ( Neurology).

## 2019-05-07 NOTE — Progress Notes (Signed)
[facility] PROGRESS NOTE  Cassandra Adams IDC:[id number] DOB: [birth year] DOA: 05/04/2019 PCP: Redmond School, MD  Brief History:  73 year old female with a history of CKD stage III, diabetes mellitus type 2, hypertension, TIA, hyperlipidemia, coronary disease, paroxysmal atrial fibrillation, and TIA presenting with right leg weakness which she first noticed on 05/04/2019 around 4:30 AM.  The patient was walking back to her bedroom at that time.  She went back to sleep.  She got up around noontime on 05/04/2019 and still felt her right leg to be weak.  The patient also felt like she had some right upper extremity weakness with numbness and tingling in her right hand.  She denied any visual disturbance, headache, new focal extremity weakness.  She denies any dysarthria or word finding difficulty.  As result, the patient presented for further evaluation.  Notably, the patient recently had a hospitalization from 02/26/2019 through 02/27/2019 during which time she had left upper extremity weakness and difficulty with phonation.  MRI of the brain at that time was negative.  She subsequently followed up with ENT, Dr. Benjamine Mola.  Apparently, her vocal cords were felt to be normal, and the patient was referred to [facility].  The patient states that her phonation has not really changed.  She denies any headache, visual disturbance, fever, chills, chest pain, shortness breath, nausea, vomiting, diarrhea.  She denies any back pain, recent falls or injuries, or right leg pain. In the emergency department, the patient had low-grade temperature 99.4 F.  She was hemodynamically stable saturating 97% room air.  BMP, LFTs, and CBC were essentially unremarkable.  CT of the brain was negative for acute intracranial abnormalities.   Assessment/Plan: Right lower extremity weakness/sensory disturbance -MRI her lumbar spine--Canal and lateral recess stenosis are greatest at L3-L4. Right foraminal stenosis is greatest at  L3-L4 and L4-L5. -MRI T-spine-->Severe left foraminal stenosis at T10-11 which could affect the left T10 nerve. Ankylosis of the T10 and T11 vertebral bodies. -MRI-C-spine--Multilevel degenerative changes as detailed above, greatest at C5-C6 and C6-C7. -MRI-Brain--no acute infarct -Clinical exam reveals some functional components to the patient's deficit--supported by PT eval -Serum I75--797 -Folic KQAS--6.0 -RVI--153 -Urinalysis 21-50 WBC -Urine drug screen -TSH 0.276 -Free T4--0.85 -Discontinue Zanaflex -CRP<0.8 -ESR 30 -PT eval-->HHPT -05/06/19--case discussed with neurosurgery (Nundkumar)--continued nonoperative management--MRI findings do not support clinical presentation;  Follow up in office -appreciate neurology consult>>continue IV steroids  Dysphonia -05/06/19--back to normal -discussed with speech therapy-->likely functional  Uncontrolled diabetes mellitus type 2, with hyperglycemia -03/04/2019 hemoglobin A1c 9.8 -increased Lantus to 45 units -increased novolog 14 units with meals -NovoLog sliding scale -Holding glipizide  Paroxysmal atrial fibrillation -Continue apixaban -Continue carvedilol -Rate controlled  CKD stage IIIa -Baseline creatinine 1.0-1.3  Hyperlipidemia -Continue statin and WelChol  Essential hypertension -Continue carvedilol-->increase to 6.25 mg bid -restart hydralazine   Disposition Plan:   Home when cleared by neurology Family Communication:   No Family at bedside  Consultants:  neuro  Code Status:  FULL  DVT Prophylaxis:  apixaban   Procedures: As Listed in Progress Note Above  Antibiotics: None    Subjective: Pt says that symptoms are slowly improving since starting the high dose steroids.  She does have headache.   Objective: Vitals:   05/07/19 0459 05/07/19 0910 05/07/19 1040 05/07/19 1409  BP: (!) 165/69 (!) 178/65 (!) 168/72 (!) 158/85  Pulse: 63 66 68 68  Resp: 18 18  20   Temp: 97.9 F (36.6 C)  98.4 F (36.9 C)  97.7  F (36.5 C)  TempSrc: Oral Oral  Oral  SpO2: 97% 97%  100%  Weight:      Height:        Intake/Output Summary (Last 24 hours) at 05/07/2019 1531 Last data filed at 05/07/2019 0026 Gross per 24 hour  Intake 410.29 ml  Output 600 ml  Net -189.71 ml   Weight change:  Exam:   General:  Pt is alert, follows commands appropriately, not in acute distress  HEENT: No icterus, No thrush, No neck mass, [place]/AT  Cardiovascular: RRR, S1/S2, no rubs, no gallops  Respiratory: CTA bilaterally, no wheezing, no crackles, no rhonchi  Abdomen: Soft/+BS, non tender, non distended, no guarding  Extremities: No edema, No lymphangitis, No petechiae, No rashes, no synovitis  Data Reviewed: I have personally reviewed following labs and imaging studies Basic Metabolic Panel: Recent Labs  Lab 05/04/19 1709 05/05/19 0448 05/07/19 0554  NA 139  --  136  K 4.4  --  4.6  CL 109  --  105  CO2 25  --  22  GLUCOSE 119*  --  394*  BUN 22  --  36*  CREATININE 1.13*  --  1.34*  CALCIUM 9.9  --  9.9  MG  --  2.3  --    Liver Function Tests: Recent Labs  Lab 05/04/19 1709  AST 33  ALT 22  ALKPHOS 85  BILITOT 0.5  PROT 7.4  ALBUMIN 3.7   No results for input(s): LIPASE, AMYLASE in the last 168 hours. No results for input(s): AMMONIA in the last 168 hours. Coagulation Profile: Recent Labs  Lab 05/04/19 1709  INR 1.2   CBC: Recent Labs  Lab 05/04/19 1709  WBC 5.7  NEUTROABS 4.1  HGB 13.5  HCT 40.2  MCV 83.2  PLT 191   Cardiac Enzymes: Recent Labs  Lab 05/05/19 0448 05/06/19 2244  CKTOTAL 147 108  CKMB  --  3.9   BNP: Invalid input(s): POCBNP CBG: Recent Labs  Lab 05/06/19 1107 05/06/19 1605 05/06/19 2145 05/07/19 0756 05/07/19 1401  GLUCAP 357* 322* 334* 336* 492*   HbA1C: Recent Labs    05/04/19 1709  HGBA1C 8.3*   Urine analysis:    Component Value Date/Time   COLORURINE YELLOW 05/05/2019 0821   APPEARANCEUR CLEAR 05/05/2019  0821   LABSPEC 1.019 05/05/2019 0821   PHURINE 5.0 05/05/2019 0821   GLUCOSEU 150 (A) 05/05/2019 0821   HGBUR NEGATIVE 05/05/2019 0821   BILIRUBINUR NEGATIVE 05/05/2019 0821   KETONESUR NEGATIVE 05/05/2019 0821   PROTEINUR 30 (A) 05/05/2019 0821   UROBILINOGEN 0.2 09/15/2012 1010   NITRITE NEGATIVE 05/05/2019 0821   LEUKOCYTESUR LARGE (A) 05/05/2019 0821    Recent Results (from the past 240 hour(s))  SARS CORONAVIRUS 2 (TAT 6-24 HRS) Nasopharyngeal Nasopharyngeal Swab     Status: None   Collection Time: 05/04/19  7:20 PM   Specimen: Nasopharyngeal Swab  Result Value Ref Range Status   SARS Coronavirus 2 NEGATIVE NEGATIVE Final    Comment: (NOTE) SARS-CoV-2 target nucleic acids are NOT DETECTED. The SARS-CoV-2 RNA is generally detectable in upper and lower respiratory specimens during the acute phase of infection. Negative results do not preclude SARS-CoV-2 infection, do not rule out co-infections with other pathogens, and should not be used as the sole basis for treatment or other patient management decisions. Negative results must be combined with clinical observations, patient history, and epidemiological information. The expected result is Negative. Fact Sheet for Patients: SugarRoll.be Fact Sheet for Healthcare Providers:  https://www.woods-mathews.com/ This test is not yet approved or cleared by the [place] and  has been authorized for detection and/or diagnosis of SARS-CoV-2 by FDA under an Emergency Use Authorization (EUA). This EUA will remain  in effect (meaning this test can be used) for the duration of the COVID-19 declaration under Section 56 4(b)(1) of the Act, 21 U.S.C. section 360bbb-3(b)(1), unless the authorization is terminated or revoked sooner. Performed at [facility], [place] [address], [place], [place] [address]   Culture, Urine     Status: Abnormal   Collection Time: 05/05/19  8:21 AM    Specimen: Urine, Clean Catch  Result Value Ref Range Status   Specimen Description   Final    URINE, CLEAN CATCH Performed at [facility], [address] [place], [place] [address]    Special Requests   Final    NONE Performed at [facility], [address], [place], [place] [address]    Culture MULTIPLE SPECIES PRESENT, SUGGEST RECOLLECTION (A)  Final   Report Status 05/07/2019 FINAL  Final     Scheduled Meds:  apixaban  5 mg Oral BID   aspirin EC  81 mg Oral Daily   atorvastatin  80 mg Oral QPM   carvedilol  6.25 mg Oral BID WC   colesevelam  1,875 mg Oral BID WC   hydrALAZINE  25 mg Oral Q8H   insulin aspart  0-20 Units Subcutaneous TID WC   insulin aspart  0-5 Units Subcutaneous QHS   insulin aspart  14 Units Subcutaneous TID WC   insulin glargine  45 Units Subcutaneous QHS   pantoprazole  40 mg Oral BID   tiZANidine  4 mg Oral QHS   Continuous Infusions:  methylPREDNISolone (SOLU-MEDROL) injection 500 mg (05/07/19 1110)    Procedures/Studies: Dg Chest 2 View  Result Date: 04/12/2019 CLINICAL DATA:  Acute chest pain following fall.  Initial encounter. EXAM: CHEST - 2 VIEW COMPARISON:  None. FINDINGS: The cardiomediastinal silhouette is unremarkable. Elevated RIGHT hemidiaphragm again noted. There is no evidence of focal airspace disease, pulmonary edema, suspicious pulmonary nodule/mass, pleural effusion, or pneumothorax. No acute bony abnormalities are identified. IMPRESSION: No active cardiopulmonary disease. Electronically Signed   By: Margarette Canada M.D.   On: 04/12/2019 14:32   Dg Pelvis 1-2 Views  Result Date: 04/12/2019 CLINICAL DATA:  Acute pelvic pain following fall. Initial encounter. 01/14/2019 and prior radiographs EXAM: PELVIS - 1-2 VIEW COMPARISON:  None. FINDINGS: No acute fracture, subluxation or dislocation. Mild degenerative changes in both hips noted. A calcified fibroid is again identified. Degenerative changes in the LOWER lumbar spine  again noted. IMPRESSION: No acute abnormality. Electronically Signed   By: Margarette Canada M.D.   On: 04/12/2019 14:34   Ct Head Wo Contrast  Result Date: 05/04/2019 CLINICAL DATA:  Possible stroke, weakness, right-sided weakness and numbness EXAM: CT HEAD WITHOUT CONTRAST TECHNIQUE: Contiguous axial images were obtained from the base of the skull through the vertex without intravenous contrast. COMPARISON:  04/12/2019 FINDINGS: Brain: No evidence of acute infarction, hemorrhage, hydrocephalus, extra-axial collection or mass lesion/mass effect. Vascular: No hyperdense vessel or unexpected calcification. Skull: Normal. Negative for fracture or focal lesion. Sinuses/Orbits: No acute finding. Other: There is a soft tissue contusion of the left forehead, which is improved compared to examination dated 04/12/2019. IMPRESSION: 1.  No acute intracranial pathology. 2. There is a soft tissue contusion of the left forehead, which is improved compared to examination dated 04/12/2019. Electronically Signed   By: Cristie Hem  Laqueta Carina M.D.   On: 05/04/2019 16:35   Ct Head Wo Contrast  Result Date: 04/12/2019 CLINICAL DATA:  Facial trauma. EXAM: CT HEAD WITHOUT CONTRAST CT MAXILLOFACIAL WITHOUT CONTRAST TECHNIQUE: Multidetector CT imaging of the head and maxillofacial structures were performed using the standard protocol without intravenous contrast. Multiplanar CT image reconstructions of the maxillofacial structures were also generated. COMPARISON:  February 26, 2019 head CT. FINDINGS: CT HEAD FINDINGS Brain: No subdural, epidural, or subarachnoid hemorrhage. Cerebellum, brainstem, and basal cisterns are normal. Ventricles are normal. No mass effect or midline shift. No acute cortical ischemia or infarct identified. Foci of high attenuation left basal ganglia are stable, consistent with calcifications. Vascular: No hyperdense vessel or unexpected calcification. Skull: Normal. Negative for fracture or focal lesion. Other: Soft  tissue swelling is seen in the left periorbital region, extending into the forehead with a hematoma. Left globe is intact. No other soft tissue abnormalities are noted. CT MAXILLOFACIAL FINDINGS Osseous: No fracture or mandibular dislocation. No destructive process. Orbits: The orbits are normal in appearance.  The globes are intact. Sinuses: Chronic sinus disease is seen in the right maxillary sinus. No acute sinus disease noted. No sinus wall fractures are identified. Mastoid air cells and middle ears are well aerated. Soft tissues: There is soft tissue swelling in the left periorbital region, extending into the forehead with a hematoma. Other soft tissues are unremarkable. IMPRESSION: 1. Soft tissue swelling in the left periorbital region. Hematoma over the left forehead. 2. The left globe is intact. 3. No facial bone fractures or calvarial fractures noted. 4. No acute intracranial abnormalities are noted. Electronically Signed   By: Dorise Bullion III M.D   On: 04/12/2019 13:18   Mr Brain W Wo Contrast  Result Date: 05/05/2019 CLINICAL DATA:  Right-sided weakness EXAM: MRI HEAD WITHOUT AND WITH CONTRAST TECHNIQUE: Multiplanar, multiecho pulse sequences of the brain and surrounding structures were obtained without and with intravenous contrast. CONTRAST:  35m GADAVIST GADOBUTROL 1 MMOL/ML IV SOLN COMPARISON:  February 27, 2019 FINDINGS: Brain: There is no acute infarction or intracranial hemorrhage. There is no intracranial mass, mass effect, edema, hydrocephalus, or extra-axial fluid collection. Patchy T2 hyperintensity in the supratentorial white matter is nonspecific but may reflect stable mild chronic microvascular ischemic changes. Ventricles and sulci are normal in size. Vascular: Major vessel flow voids at the skull base are preserved. Skull and upper cervical spine: Marrow signal is within normal limits. Cervical spine is better evaluated on concurrent dedicated imaging. Sinuses/Orbits: Mild  paranasal sinus mucosal thickening. Bilateral lens replacements. Other: Left frontal scalp hematoma as seen on prior CT imaging. Trace right mastoid fluid opacification. IMPRESSION: No evidence of acute infarction, intracranial hemorrhage, or mass. Stable mild chronic microvascular ischemic changes. Electronically Signed   By: PMacy MisM.D.   On: 05/05/2019 11:13   Mr Thoracic Spine Wo Contrast  Result Date: 05/06/2019 CLINICAL DATA:  Progressive ataxia. Upper back pain for 2 months. EXAM: MRI THORACIC SPINE WITHOUT CONTRAST TECHNIQUE: Multiplanar, multisequence MR imaging of the thoracic spine was performed. No intravenous contrast was administered. COMPARISON:  Chest x-ray dated 04/12/2019 FINDINGS: Alignment: 2 mm retrolisthesis of T11 on T12. Alignment is otherwise normal. Vertebrae: Ankylosis of the T10 and T11 vertebral bodies. No fracture, evidence of discitis, or bone lesion. Cord:  Normal signal and morphology.  Tip of the conus is at L1. Paraspinal and other soft tissues: Negative. Disc levels: T1-2 through T6-7: No significant disc bulging or disc protrusion. No spinal or foraminal stenosis. T7-8: Tiny  central disc bulge with no neural impingement. Otherwise negative. T8-9 and T9-10: Normal. T10-11: Marked disc space narrowing. Small broad-based endplate osteophytes symmetrically indent the ventral aspect of the thecal sac without focal neural impingement. There is severe left foraminal stenosis which could affect the left T10 nerve. There is ankylosis of the T10 and T11 vertebral bodies. T11-12: Marked disc space narrowing. Slight retrolisthesis. Small broad-based endplate osteophytes do not create significant spinal stenosis. No significant foraminal stenosis. T12-L1: Negative. IMPRESSION: 1. Severe left foraminal stenosis at T10-11 which could affect the left T10 nerve. 2. Ankylosis of the T10 and T11 vertebral bodies. 3. No other significant abnormalities. Specifically, no evidence to  explain the patient's progressive ataxia. Electronically Signed   By: Lorriane Shire M.D.   On: 05/06/2019 09:57   Mr Cervical Spine W Wo Contrast  Result Date: 05/05/2019 CLINICAL DATA:  Radiculopathy, right-sided weakness EXAM: MRI CERVICAL SPINE WITHOUT AND WITH CONTRAST TECHNIQUE: Multiplanar and multiecho pulse sequences of the cervical spine, to include the craniocervical junction and cervicothoracic junction, were obtained without and with intravenous contrast. CONTRAST:  78m GADAVIST GADOBUTROL 1 MMOL/ML IV SOLN COMPARISON:  None. FINDINGS: Alignment: Straightening of the cervical lordosis. There is focal kyphosis at C5-C6. Vertebrae: Vertebral body heights are maintained apart from anterior wedging and endplate irregularity at C5 and C6. There is no significant marrow edema. No suspicious osseous lesion. Cord: No abnormal cord signal. Posterior Fossa, vertebral arteries, paraspinal tissues: Unremarkable. Disc levels: C2-C3:  Disc bulge.  No significant canal or foraminal stenosis. C3-C4: Small central disc protrusion. Mild canal stenosis. No foraminal stenosis. C4-C5: Very small central disc protrusion. Mild facet and uncovertebral hypertrophy. No canal or foraminal stenosis. C5-C6: Right central disc extrusion extending below the disc level, endplate osteophytes, and facet and uncovertebral hypertrophy. Mild canal stenosis with flattening of the right ventral aspect of the cord. No foraminal stenosis. C6-C7: Disc bulge, endplate osteophytes, and facet and uncovertebral hypertrophy. Moderate canal stenosis. Marked foraminal stenosis, right greater than left. C7-T1:  Facet hypertrophy.  No canal or foraminal stenosis. IMPRESSION: Multilevel degenerative changes as detailed above, greatest at C5-C6 and C6-C7. Electronically Signed   By: PMacy MisM.D.   On: 05/05/2019 11:31   Mr Lumbar Spine W Wo Contrast  Result Date: 05/05/2019 CLINICAL DATA:  Radiculopathy, right-sided weakness EXAM: MRI  LUMBAR SPINE WITHOUT AND WITH CONTRAST TECHNIQUE: Multiplanar and multiecho pulse sequences of the lumbar spine were obtained without and with intravenous contrast. CONTRAST:  759mGADAVIST GADOBUTROL 1 MMOL/ML IV SOLN COMPARISON:  None. FINDINGS: Motion artifact is present. Segmentation: For the purposes of this dictation, there are 5 lumbar type vertebral bodies with the caudal most designated L5. Alignment:  There is mild anterolisthesis at L3-L4. Vertebrae: Vertebral body heights are maintained apart from degenerative endplate irregularity and Schmorl's nodes, greatest at L1-L2 and L2-L3. There is degenerative endplate marrow edema at the levels as well. Degenerative marrow edema is also present at the right L4-L5 facet joint. Conus medullaris and cauda equina: Conus extends to the L1 level. Conus and cauda equina appear normal. Paraspinal and other soft tissues: Unremarkable. Disc levels: L1-L2: Disc bulge and facet arthropathy with ligamentum flavum infolding. Mild canal stenosis. Mild to moderate right and mild left foraminal stenosis. L2-L3: Disc bulge and facet arthropathy (marked on the right) with ligamentum flavum infolding. Moderate canal stenosis with narrowing of the lateral recesses. Mild foraminal stenosis. L3-L4: Anterolisthesis with uncovering of disc bulge and marked facet arthropathy with ligamentum flavum infolding. Marked canal stenosis  with narrowing of the lateral recesses. Moderate to marked right and mild left foraminal stenosis. L4-L5: Disc bulge with endplate osteophytic ridging. Moderate to marked facet arthropathy with joint effusion on the right and ligamentum flavum infolding. Moderate canal stenosis with narrowing of the lateral recesses. Moderate to marked foraminal stenosis. L5-S1: Small right far lateral disc protrusion. Moderate facet arthropathy. No canal stenosis. Mild right foraminal stenosis. No left foraminal stenosis. IMPRESSION: Multilevel degenerative changes as detailed  above. Canal and lateral recess stenosis are greatest at L3-L4. Right foraminal stenosis is greatest at L3-L4 and L4-L5. Electronically Signed   By: Macy Mis M.D.   On: 05/05/2019 11:44   Ct Maxillofacial Wo Contrast  Result Date: 04/12/2019 CLINICAL DATA:  Facial trauma. EXAM: CT HEAD WITHOUT CONTRAST CT MAXILLOFACIAL WITHOUT CONTRAST TECHNIQUE: Multidetector CT imaging of the head and maxillofacial structures were performed using the standard protocol without intravenous contrast. Multiplanar CT image reconstructions of the maxillofacial structures were also generated. COMPARISON:  February 26, 2019 head CT. FINDINGS: CT HEAD FINDINGS Brain: No subdural, epidural, or subarachnoid hemorrhage. Cerebellum, brainstem, and basal cisterns are normal. Ventricles are normal. No mass effect or midline shift. No acute cortical ischemia or infarct identified. Foci of high attenuation left basal ganglia are stable, consistent with calcifications. Vascular: No hyperdense vessel or unexpected calcification. Skull: Normal. Negative for fracture or focal lesion. Other: Soft tissue swelling is seen in the left periorbital region, extending into the forehead with a hematoma. Left globe is intact. No other soft tissue abnormalities are noted. CT MAXILLOFACIAL FINDINGS Osseous: No fracture or mandibular dislocation. No destructive process. Orbits: The orbits are normal in appearance.  The globes are intact. Sinuses: Chronic sinus disease is seen in the right maxillary sinus. No acute sinus disease noted. No sinus wall fractures are identified. Mastoid air cells and middle ears are well aerated. Soft tissues: There is soft tissue swelling in the left periorbital region, extending into the forehead with a hematoma. Other soft tissues are unremarkable. IMPRESSION: 1. Soft tissue swelling in the left periorbital region. Hematoma over the left forehead. 2. The left globe is intact. 3. No facial bone fractures or calvarial  fractures noted. 4. No acute intracranial abnormalities are noted. Electronically Signed   By: Dorise Bullion III M.D   On: 04/12/2019 13:18    Irwin Brakeman, MD  [facility] How to contact the [facility] Attending or Consulting provider [place] or covering provider during after hours [place], for this patient?  1. Check the care team in [facility] and look for a) attending/consulting [facility] provider listed and b) the [facility] team listed 2. Log into [email or web address] and use [facility] universal password to access. If you do not have the password, please contact the hospital operator. 3. Locate the [facility] provider you are looking for under [facility] and page to a number that you can be directly reached. 4. If you still have difficulty reaching the provider, please page the [facility] (Director on Call) for the Hospitalists listed on amion for assistance.   If 7PM-7AM, please contact night-coverage [email or web address] Password [email or web address] 05/07/2019, 3:31 PM   LOS: 2 days

## 2019-05-07 NOTE — Evaluation (Signed)
Occupational Therapy Evaluation Patient Details Name: Cassandra Adams MRN: [id number] DOB: Nov 12, 1945 Today's Date: 05/07/2019    History of Present Illness Cassandra Adams is a 73 y.o. female with medical history significant of hypertension, hyperlipidemia, diabetes mellitus type 2, paroxysmal atrial fibrillation, coronary artery disease, history of TIA who presented to the ER with right-sided weakness.  Patient states she was doing well until around noon on 05/04/2019.  She says she had woken up at 4:30 in the morning when she was fine and had gone to the bathroom and then went back to sleep.  When she woke up at around noon she noted she had significant right-sided weakness mainly in the right lower extremity.  She did complain of some right upper extremity weakness as well. She also reports having decreased sensation on the right upper and lower extremity. She does have difficulty with speaking and has a very hoarse and soft voice and only is able to speak in whispers.  She was seen by ENT as an outpatient by Dr. Lorelee Cover and apparently had a laryngoscopy, reports not available to the patient says there may have been some issue with vocal cords.  She was referred to a specialist in [place] as per her.  She does have feeling of dysphagia with solid foods feeling that they get stuck in throat occasionally.  The symptoms are present since prior admission.Review of records shows patient was admitted from 02/26/2019-02/27/2019 with difficulty speaking, left-sided weakness and had an MRI of her brain at the time to rule out stroke which was noted to be negative.  She was thought to have possible TIA as well as concern for vocal cord dysfunction at the time.   Clinical Impression   Pt agreeable to OT evaluation this am, reports feeling much better today. Pt performing ADLs at supervision to mod I level, no LOB during session. Pt using RW for functional mobility. Pt demonstrates BUE strength WFL, coordination  and sensation are intact. Pt reporting chronic tingling in right hand, has been told in the past due to diabetes. Pt appears to be at baseline with ADL completion, no further OT services required at this time.     Follow Up Recommendations  No OT follow up    Equipment Recommendations  None recommended by OT       Precautions / Restrictions Precautions Precautions: None Restrictions Weight Bearing Restrictions: No      Mobility Bed Mobility Overal bed mobility: Modified Independent                Transfers Overall transfer level: Needs assistance Equipment used: Rolling walker (2 wheeled) Transfers: Sit to/from Stand;Stand Pivot Transfers Sit to Stand: Supervision Stand pivot transfers: Supervision                ADL either performed or assessed with clinical judgement   ADL Overall ADL's : Needs assistance/impaired     Grooming: Wash/dry hands;Supervision/safety;Standing Grooming Details (indicate cue type and reason): performing tasks at sink with no UE support, good balance                 Toilet Transfer: Supervision/safety;Ambulation;RW   Toileting- Clothing Manipulation and Hygiene: Supervision/safety;Sit to/from stand       Functional mobility during ADLs: Supervision/safety;Rolling walker       Vision Baseline Vision/History: Wears glasses Wears Glasses: At all times Patient Visual Report: No change from baseline Vision Assessment?: No apparent visual deficits  Pertinent Vitals/Pain Pain Assessment: No/denies pain     Hand Dominance Right   Extremity/Trunk Assessment Upper Extremity Assessment Upper Extremity Assessment: Overall WFL for tasks assessed   Lower Extremity Assessment Lower Extremity Assessment: Defer to PT evaluation   Cervical / Trunk Assessment Cervical / Trunk Assessment: Normal   Communication Communication Communication: Expressive difficulties;Other (comment)(speaks very softly)    Cognition Arousal/Alertness: Awake/alert Behavior During Therapy: WFL for tasks assessed/performed Overall Cognitive Status: Within Functional Limits for tasks assessed                                                Home Living Family/patient expects to be discharged to:: Private residence Living Arrangements: Alone Available Help at Discharge: Family;Available PRN/intermittently Type of Home: Apartment Home Access: Level entry     Home Layout: One level     Bathroom Shower/Tub: Teacher, early years/pre: Standard     Home Equipment: Clinical cytogeneticist - 2 wheels;Cane - single point          Prior Functioning/Environment Level of Independence: Independent with assistive device(s)        Comments: household and short distanced community ambulator with RW. Independent with ADLs        OT Problem List: Decreased activity tolerance       End of Session Equipment Utilized During Treatment: Rolling walker  Activity Tolerance: Patient tolerated treatment well Patient left: in chair;with call bell/phone within reach  OT Visit Diagnosis: Muscle weakness (generalized) (M62.81)                Time: 4643-1427 OT Time Calculation (min): 12 min Charges:  OT General Charges $OT Visit: 1 Visit OT Evaluation $OT Eval Low Complexity: [place], OTR/L  [phone] 05/07/2019, 7:47 AM

## 2019-05-07 NOTE — Care Management Important Message (Signed)
Important Message  Patient Details  Name: Cassandra Adams MRN: [id number] Date of Birth: [birth year]   Medicare Important Message Given:  Yes     Tommy Medal 05/07/2019, 4:02 PM

## 2019-05-07 NOTE — Progress Notes (Addendum)
Patient blood pressure found to be 229/78 when tech took it at 1718, recheck at 1756 and found to be 197/84. Too early for PRN medication.Patient's only complaint is a headache. MD paged and made aware, was instructed to hold and he would make correction to baseline medications to improve blood pressure. Patient in stable condition. Grandson at patient bedside and both patient and grandson updated and made aware of medication plans and plans to monitor blood pressure throughout the night. RN made aware at bedside hand off of patient blood pressure and need to monitor is closely.

## 2019-05-07 NOTE — TOC Progression Note (Signed)
Transition of Care [facility]) - Progression Note    Patient Details  Name: Cassandra Adams MRN: [id number] Date of Birth: October 11, 1945  Transition of Care [facility]) CM/SW Contact  Song Myre, Chauncey Reading, RN Phone Number: 05/07/2019, 1:16 PM  Clinical Narrative:    [facility] now declining referral. Referred to [place].    Expected Discharge Plan: [place] Barriers to Discharge: Continued Medical Work up  Expected Discharge Plan and Services Expected Discharge Plan: [place]   Discharge Planning Services: CM Consult Post Acute Care Choice: [place] arrangements for the past 2 months: Single Family Home                           HH Arranged: PT, Speech Therapy [facility] Date [place]: 05/06/19 Time [place]: [phone] Representative spoke with at [place]: [place] ([place]) Interventions    Readmission Risk Interventions No flowsheet data found.

## 2019-05-07 NOTE — Progress Notes (Signed)
Physical Therapy Treatment Patient Details Name: Cassandra Adams MRN: [id number] DOB: 09-09-1945 Today's Date: 05/07/2019    History of Present Illness Cassandra Adams is a 73 y.o. female with medical history significant of hypertension, hyperlipidemia, diabetes mellitus type 2, paroxysmal atrial fibrillation, coronary artery disease, history of TIA who presented to the ER with right-sided weakness.  Patient states she was doing well until around noon on 05/04/2019.  She says she had woken up at 4:30 in the morning when she was fine and had gone to the bathroom and then went back to sleep.  When she woke up at around noon she noted she had significant right-sided weakness mainly in the right lower extremity.  She did complain of some right upper extremity weakness as well. She also reports having decreased sensation on the right upper and lower extremity. She does have difficulty with speaking and has a very hoarse and soft voice and only is able to speak in whispers.  She was seen by ENT as an outpatient by Dr. Lorelee Cover and apparently had a laryngoscopy, reports not available to the patient says there may have been some issue with vocal cords.  She was referred to a specialist in [place] as per her.  She does have feeling of dysphagia with solid foods feeling that they get stuck in throat occasionally.  The symptoms are present since prior admission.Review of records shows patient was admitted from 02/26/2019-02/27/2019 with difficulty speaking, left-sided weakness and had an MRI of her brain at the time to rule out stroke which was noted to be negative.  She was thought to have possible TIA as well as concern for vocal cord dysfunction at the time.    PT Comments    Pt agreeable to PT despite headache. Pt with improving endurance this session, able to ambulate increased distance without unsteadiness or loss of balance noted. Pt requires initial education on hand placement with transfers and able to  perform safely for following reps. Pt tolerates seated therapeutic exercises with therapeutic rest breaks due to mild fatigue. Pt returned to bed with call bell in hand at EOS due to headache and fatigue with treatment session. Patient will benefit from continued physical therapy in hospital and recommendations below to increase strength, balance, endurance for safe ADLs and gait.    Follow Up Recommendations  Home health PT;Supervision for mobility/OOB;Supervision - Intermittent     Equipment Recommendations  None recommended by PT    Recommendations for Other Services       Precautions / Restrictions Precautions Precautions: None Restrictions Weight Bearing Restrictions: No    Mobility  Bed Mobility Overal bed mobility: Independent                Transfers Overall transfer level: Needs assistance Equipment used: Rolling walker (2 wheeled) Transfers: Sit to/from Stand;Stand Pivot Transfers Sit to Stand: Modified independent (Device/Increase time) Stand pivot transfers: Modified independent (Device/Increase time)       General transfer comment: verbal cues to avoid avoid pulling on RW, push from seated surface  Ambulation/Gait Ambulation/Gait assistance: Supervision Gait Distance (Feet): 70 Feet Assistive device: Rolling walker (2 wheeled) Gait Pattern/deviations: Step-through pattern;Decreased stride length Gait velocity: decreased   General Gait Details: decreased cadence, increased time with turns, able to maintain body within RW frame, no unsteadiness or loss of balance   Stairs             Wheelchair Mobility    Modified Rankin (Stroke Patients Only)  Balance Overall balance assessment: Needs assistance Sitting-balance support: Feet supported;No upper extremity supported Sitting balance-Leahy Scale: Good Sitting balance - Comments: seated at EOB   Standing balance support: During functional activity;Bilateral upper extremity  supported Standing balance-Leahy Scale: Good Standing balance comment: using RW                            Cognition Arousal/Alertness: Awake/alert Behavior During Therapy: WFL for tasks assessed/performed Overall Cognitive Status: Within Functional Limits for tasks assessed                                        Exercises General Exercises - Lower Extremity Long Arc Quad: Seated;Both;10 reps Hip Flexion/Marching: Seated;Both;10 reps Toe Raises: Seated;Both;10 reps Heel Raises: Seated;Both;10 reps    General Comments        Pertinent Vitals/Pain Pain Score: 5  Pain Location: Headache Pain Descriptors / Indicators: Pounding Pain Intervention(s): Limited activity within patient's tolerance;Monitored during session    Home Living                      Prior Function            PT Goals (current goals can now be found in the care plan section) Acute Rehab PT Goals Patient Stated Goal: return home with family to assist PT Goal Formulation: With patient Time For Goal Achievement: 05/08/19 Potential to Achieve Goals: Good Progress towards PT goals: Progressing toward goals    Frequency    Min 3X/week      PT Plan Current plan remains appropriate    Co-evaluation              AM-PAC PT "6 Clicks" Mobility   Outcome Measure  Help needed turning from your back to your side while in a flat bed without using bedrails?: None Help needed moving from lying on your back to sitting on the side of a flat bed without using bedrails?: None Help needed moving to and from a bed to a chair (including a wheelchair)?: None Help needed standing up from a chair using your arms (e.g., wheelchair or bedside chair)?: None Help needed to walk in hospital room?: None Help needed climbing 3-5 steps with a railing? : A Little 6 Click Score: 23    End of Session   Activity Tolerance: Patient tolerated treatment well Patient left: in bed;with  call bell/phone within reach Nurse Communication: Mobility status PT Visit Diagnosis: Unsteadiness on feet (R26.81);Other abnormalities of gait and mobility (R26.89);Muscle weakness (generalized) (M62.81)     Time: 1856-3149 PT Time Calculation (min) (ACUTE ONLY): 14 min  Charges:  $Gait Training: 8-22 mins                     Tori Shareena Nusz PT, DPT 05/07/19, 2:36 PM [phone]

## 2019-05-07 NOTE — Care Management Important Message (Signed)
Important Message  Patient Details  Name: Cassandra Adams MRN: [id number] Date of Birth: Mar 13, 1946   Medicare Important Message Given:  Yes     Tommy Medal 05/07/2019, 2:42 PM

## 2019-05-08 LAB — BASIC METABOLIC PANEL
Anion gap: 10 (ref 5–15)
BUN: 39 mg/dL — ABNORMAL HIGH (ref 8–23)
CO2: 22 mmol/L (ref 22–32)
Calcium: 10.3 mg/dL (ref 8.9–10.3)
Chloride: 104 mmol/L (ref 98–111)
Creatinine, Ser: 1.32 mg/dL — ABNORMAL HIGH (ref 0.44–1.00)
GFR calc Af Amer: 46 mL/min — ABNORMAL LOW (ref >60)
GFR calc non Af Amer: 40 mL/min — ABNORMAL LOW (ref >60)
Glucose, Bld: 319 mg/dL — ABNORMAL HIGH (ref 70–99)
Potassium: 5.5 mmol/L — ABNORMAL HIGH (ref 3.5–5.1)
Sodium: 136 mmol/L (ref 135–145)

## 2019-05-08 LAB — GLUCOSE, CAPILLARY
Glucose-Capillary: 102 mg/dL — ABNORMAL HIGH (ref 70–99)
Glucose-Capillary: 161 mg/dL — ABNORMAL HIGH (ref 70–99)
Glucose-Capillary: 211 mg/dL — ABNORMAL HIGH (ref 70–99)
Glucose-Capillary: 222 mg/dL — ABNORMAL HIGH (ref 70–99)
Glucose-Capillary: 285 mg/dL — ABNORMAL HIGH (ref 70–99)
Glucose-Capillary: 82 mg/dL (ref 70–99)

## 2019-05-08 MED ORDER — INSULIN ASPART 100 UNIT/ML ~~LOC~~ SOLN
18.0000 [IU] | Freq: Three times a day (TID) | SUBCUTANEOUS | Status: DC
  Administered 2019-05-08 – 2019-05-09 (×3): 18 [IU] via SUBCUTANEOUS

## 2019-05-08 MED ORDER — SODIUM CHLORIDE 0.9 % IV BOLUS
500.0000 mL | Freq: Once | INTRAVENOUS | Status: AC
  Administered 2019-05-08: 500 mL via INTRAVENOUS

## 2019-05-08 MED ORDER — HYDRALAZINE HCL 25 MG PO TABS: 50.0000 mg | ORAL_TABLET | Freq: Four times a day (QID) | ORAL | Status: DC | PRN

## 2019-05-08 MED ORDER — AMLODIPINE BESYLATE 5 MG PO TABS
10.0000 mg | ORAL_TABLET | Freq: Every day | ORAL | Status: DC
  Administered 2019-05-08 – 2019-05-12 (×5): 10 mg via ORAL
  Filled 2019-05-08 (×5): qty 2

## 2019-05-08 MED ORDER — HYDRALAZINE HCL 25 MG PO TABS
50.0000 mg | ORAL_TABLET | Freq: Three times a day (TID) | ORAL | Status: DC
  Administered 2019-05-08 – 2019-05-12 (×12): 50 mg via ORAL
  Filled 2019-05-08 (×12): qty 2

## 2019-05-08 MED ORDER — MECLIZINE HCL 12.5 MG PO TABS
25.0000 mg | ORAL_TABLET | Freq: Three times a day (TID) | ORAL | Status: DC | PRN
  Administered 2019-05-08 – 2019-05-11 (×2): 25 mg via ORAL
  Filled 2019-05-08 (×2): qty 2

## 2019-05-08 MED ORDER — SODIUM CHLORIDE 0.9 % IV SOLN
INTRAVENOUS | Status: DC
  Administered 2019-05-08 – 2019-05-11 (×6): via INTRAVENOUS

## 2019-05-08 MED ORDER — TRAMADOL HCL 50 MG PO TABS
50.0000 mg | ORAL_TABLET | Freq: Four times a day (QID) | ORAL | Status: DC | PRN
  Administered 2019-05-08 – 2019-05-11 (×4): 50 mg via ORAL
  Filled 2019-05-08 (×4): qty 1

## 2019-05-08 MED ORDER — INSULIN GLARGINE 100 UNIT/ML ~~LOC~~ SOLN
60.0000 [IU] | Freq: Every day | SUBCUTANEOUS | Status: DC
  Filled 2019-05-08 (×4): qty 0.6

## 2019-05-08 MED ORDER — SODIUM POLYSTYRENE SULFONATE 15 GM/60ML PO SUSP
15.0000 g | Freq: Once | ORAL | Status: AC
  Administered 2019-05-08: 15 g via ORAL
  Filled 2019-05-08: qty 60

## 2019-05-08 NOTE — Progress Notes (Signed)
[place] A. Merlene Laughter, MD     [email or web address]          AYVEN GLASCO is an 73 y.o. female.   Assessment/Plan: 1. Acute/subacute right lower extremity monoplegia with physical examination highly suggestive of a myelopathy.   The patient most likely has multifactorial causes including myelopathy and the multilevel radiculopathy.  She has improved significantly.  I suspect we should continue the current Solu-Medrol for another couple of days.  Again she will need physical therapy. 2. Mild motor impairment of the right hand of unclear etiology. There is some suggestion that the patient may have carpal tunnel syndrome which could be the etiology. 3. Numbness and pain of the right hand which seems consistent with carpal tunnel syndrome. 4. Diabetic polyneuropathy. 5. Previous right hemiparesis of on clear etiology. 6. Hypophonia currently due to primarily vocal cord issues. No evidence of parkinsonian syndrome. The history and examination does not support a typical case of myasthenia gravis. However, I will obtain antibodies for myasthenia gravis. This will be the acetylcholine receptor antibodies. 7. Repeat falls likely due to 1.     She reports improvement in the symptoms involving the legs.  She is complaining of some headaches.  Headaches and dizziness are reported but these have improved with appropriate medication.  She is encouraged to do physical therapy which she did not do today because of the dizziness.     GENERAL:  Pleasant female is in no acute distress.  HEENT:  Neck is supple no trauma appreciated.  ABDOMEN: Soft  EXTREMITIES: No edema   BACK: Normal alignment.  SKIN: Normal by inspection.    MENTAL STATUS: Alert and oriented. Speech, language and cognition are generally intact. Judgment and insight normal.   CRANIAL NERVES: Pupils are equal, round and reactive to light and accommodation; extraocular movements are full, there  is no significant nystagmus; upper and lower facial muscles are normal in strength and symmetric including the obicularis oculi, there is no flattening of the nasolabial folds; tongue is midline; uvula is midline; shoulder elevation is normal.  MOTOR:  She has normal tone bulk and strength of the upper extremities. The right leg spasticity has improved markedly.  Strength is now clearly 3/5.  The left leg is no longer spastic. Strength is graded as 4/5.  COORDINATION: Left finger to nose is normal, right finger to nose is normal, No rest tremor; no intention tremor; no postural tremor; no bradykinesia.  REFLEXES: Deep tendon reflexes are symmetrical and normal in the upper extremities but absent in the legs. Plantar responses are flexor bilaterally.   SENSATION: Normal to light touch and temperature.    Thoracic spine MRI shows marked herniated this T10/T11 and T11/T12. There is also anterolisthesis at T10-T11. At these levels there is encroachment on the spinal cord especially at T10-T11.  Objective: Vital signs in last 24 hours: Temp:  [97.8 F (36.6 C)-98.7 F (37.1 C)] 97.8 F (36.6 C) (11/12 1423) Pulse Rate:  [50-67] 50 (11/12 1423) Resp:  [16-20] 20 (11/12 1423) BP: (133-184)/(50-73) 141/61 (11/12 1441) SpO2:  [93 %-99 %] 97 % (11/12 1423) Weight:  [97.3 kg] 97.3 kg (11/12 0519)  Intake/Output from previous day: 11/11 0701 - 11/12 0700 In: 770 [P.O.:720; IV Piggyback:50] Out: -  Intake/Output this shift: Total I/O In: 360 [P.O.:360] Out: 450 [Urine:450] Nutritional status:  Diet Order            Diet heart healthy/carb modified Room service appropriate? Yes; Fluid consistency: Thin  Diet  effective now               Lab Results: Results for orders placed or performed during the hospital encounter of 05/04/19 (from the past 48 hour(s))  Glucose, capillary     Status: Abnormal   Collection Time: 05/06/19  9:45 PM  Result Value Ref Range   Glucose-Capillary  334 (H) 70 - 99 mg/dL  CK total and CKMB (cardiac)not at [facility]     Status: Abnormal   Collection Time: 05/06/19 10:44 PM  Result Value Ref Range   Total CK 108 38 - 234 U/L    Comment: Performed at [facility], [address] [place], [place] [address]   CK, MB 3.9 0.5 - 5.0 ng/mL   Relative Index 3.6 (H) 0.0 - 2.5    Comment: Performed at [facility] [address], [place], [place] [address]  Basic metabolic panel     Status: Abnormal   Collection Time: 05/07/19  5:54 AM  Result Value Ref Range   Sodium 136 135 - 145 mmol/L   Potassium 4.6 3.5 - 5.1 mmol/L   Chloride 105 98 - 111 mmol/L   CO2 22 22 - 32 mmol/L   Glucose, Bld 394 (H) 70 - 99 mg/dL   BUN 36 (H) 8 - 23 mg/dL   Creatinine, Ser 1.34 (H) 0.44 - 1.00 mg/dL   Calcium 9.9 8.9 - 10.3 mg/dL   GFR calc non Af Amer 39 (L) >60 mL/min   GFR calc Af Amer 45 (L) >60 mL/min   Anion gap 9 5 - 15    Comment: Performed at [facility], [address], [place], [place] [address]  Glucose, capillary     Status: Abnormal   Collection Time: 05/07/19  7:56 AM  Result Value Ref Range   Glucose-Capillary 336 (H) 70 - 99 mg/dL  Glucose, capillary     Status: Abnormal   Collection Time: 05/07/19  2:01 PM  Result Value Ref Range   Glucose-Capillary 492 (H) 70 - 99 mg/dL  Glucose, capillary     Status: Abnormal   Collection Time: 05/07/19  3:47 PM  Result Value Ref Range   Glucose-Capillary 426 (H) 70 - 99 mg/dL  Glucose, capillary     Status: Abnormal   Collection Time: 05/07/19  9:21 PM  Result Value Ref Range   Glucose-Capillary 245 (H) 70 - 99 mg/dL   Comment 1 Notify RN    Comment 2 Document in Chart   Glucose, capillary     Status: None   Collection Time: 05/08/19 12:50 AM  Result Value Ref Range   Glucose-Capillary 82 70 - 99 mg/dL  Glucose, capillary     Status: Abnormal   Collection Time: 05/08/19  2:56 AM  Result Value Ref Range   Glucose-Capillary 161 (H) 70 - 99 mg/dL   Comment 1 Notify RN    Comment 2  Document in Chart   Glucose, capillary     Status: Abnormal   Collection Time: 05/08/19  7:12 AM  Result Value Ref Range   Glucose-Capillary 211 (H) 70 - 99 mg/dL  Basic metabolic panel     Status: Abnormal   Collection Time: 05/08/19  9:17 AM  Result Value Ref Range   Sodium 136 135 - 145 mmol/L   Potassium 5.5 (H) 3.5 - 5.1 mmol/L   Chloride 104 98 - 111 mmol/L   CO2 22 22 - 32 mmol/L   Glucose, Bld 319 (H) 70 - 99 mg/dL  BUN 39 (H) 8 - 23 mg/dL   Creatinine, Ser 1.32 (H) 0.44 - 1.00 mg/dL   Calcium 10.3 8.9 - 10.3 mg/dL   GFR calc non Af Amer 40 (L) >60 mL/min   GFR calc Af Amer 46 (L) >60 mL/min   Anion gap 10 5 - 15    Comment: Performed at [facility], [address], [place], [place] [address]  Glucose, capillary     Status: Abnormal   Collection Time: 05/08/19 11:27 AM  Result Value Ref Range   Glucose-Capillary 285 (H) 70 - 99 mg/dL  Glucose, capillary     Status: Abnormal   Collection Time: 05/08/19  4:43 PM  Result Value Ref Range   Glucose-Capillary 222 (H) 70 - 99 mg/dL    Lipid Panel No results for input(s): CHOL, TRIG, HDL, CHOLHDL, VLDL, LDLCALC in the last 72 hours.  Studies/Results:   Medications:  Scheduled Meds: . amLODipine  10 mg Oral Daily  . apixaban  5 mg Oral BID  . aspirin EC  81 mg Oral Daily  . atorvastatin  80 mg Oral QPM  . carvedilol  6.25 mg Oral BID WC  . colesevelam  1,875 mg Oral BID WC  . hydrALAZINE  50 mg Oral Q8H  . insulin aspart  0-20 Units Subcutaneous TID WC  . insulin aspart  0-5 Units Subcutaneous QHS  . insulin aspart  18 Units Subcutaneous TID WC  . insulin glargine  60 Units Subcutaneous QHS  . pantoprazole  40 mg Oral BID  . tiZANidine  4 mg Oral QHS   Continuous Infusions: . sodium chloride 50 mL/hr at 05/08/19 1330  . methylPREDNISolone (SOLU-MEDROL) injection 500 mg (05/08/19 0913)   PRN Meds:.acetaminophen, hydrALAZINE, meclizine, nitroGLYCERIN, ondansetron (ZOFRAN) IV, senna-docusate, traMADol     LOS:  3 days   [place] A. Merlene Laughter, M.D.  Diplomate, Tax adviser of Psychiatry and Neurology ( Neurology).

## 2019-05-08 NOTE — Progress Notes (Signed)
Physical Therapy Note  Patient Details  Name: Cassandra Adams MRN: [id number] Date of Birth: 09-21-1945 Today's Date: 05/08/2019    Pt refused without reason given  Teena Irani, PTA/CLT [phone]    Mare Ferrari, Amy B 05/08/2019, 4:22 PM

## 2019-05-08 NOTE — TOC Progression Note (Signed)
Transition of Care [facility]) - Progression Note    Patient Details  Name: Cassandra Adams MRN: [id number] Date of Birth: 05-Mar-1946  Transition of Care [facility]) CM/SW Contact  Careli Luzader, Chauncey Reading, RN Phone Number: 05/08/2019, 1:58 PM  Clinical Narrative:   Patient had questions about her IM and home health referrals. Questions answered. Plan is still to return home with home health.     Expected Discharge Plan: [place] Barriers to Discharge: Continued Medical Work up  Expected Discharge Plan and Services Expected Discharge Plan: [place]   Discharge Planning Services: CM Consult Post Acute Care Choice: [place] arrangements for the past 2 months: Single Family Home                           HH Arranged: PT, Speech Therapy HH [facility] Date [place]: 05/06/19 Time [place]: [phone] Representative spoke with at [place]: [place] ([place]) Interventions    Readmission Risk Interventions No flowsheet data found.

## 2019-05-08 NOTE — Progress Notes (Signed)
Cassandra Adams  PURVA VESSELL XBD:[id number] DOB: 16-Jul-1945 DOA: 05/04/2019 PCP: Redmond School, MD  Brief History:  73 year old female with a history of CKD stage III, diabetes mellitus type 2, hypertension, TIA, hyperlipidemia, coronary disease, paroxysmal atrial fibrillation, and TIA presenting with right leg weakness which she first noticed on 05/04/2019 around 4:30 AM.  The patient was walking back to her bedroom at that time.  She went back to sleep.  She got up around noontime on 05/04/2019 and still felt her right leg to be weak.  The patient also felt like she had some right upper extremity weakness with numbness and tingling in her right hand.  She denied any visual disturbance, headache, new focal extremity weakness.  She denies any dysarthria or word finding difficulty.  As result, the patient presented for further evaluation.  Notably, the patient recently had a hospitalization from 02/26/2019 through 02/27/2019 during which time she had left upper extremity weakness and difficulty with phonation.  MRI of the brain at that time was negative.  She subsequently followed up with ENT, Dr. Benjamine Mola.  Apparently, her vocal cords were felt to be normal, and the patient was referred to [facility].  The patient states that her phonation has not really changed.  She denies any headache, visual disturbance, fever, chills, chest pain, shortness breath, nausea, vomiting, diarrhea.  She denies any back pain, recent falls or injuries, or right leg pain. In the emergency department, the patient had low-grade temperature 99.4 F.  She was hemodynamically stable saturating 97% room air.  BMP, LFTs, and CBC were essentially unremarkable.  CT of the brain was negative for acute intracranial abnormalities.   Assessment/Plan: Right lower extremity weakness/sensory disturbance highly suggestive of myelopathy -MRI her lumbar spine--Canal and lateral recess stenosis are greatest at L3-L4. Right  foraminal stenosis is greatest at L3-L4 and L4-L5. -MRI T-spine-->Severe left foraminal stenosis at T10-11 which could affect the left T10 nerve. Ankylosis of the T10 and T11 vertebral bodies. -MRI-C-spine--Multilevel degenerative changes as detailed above, greatest at C5-C6 and C6-C7. -MRI-Brain--no acute infarct -Clinical exam reveals some functional components to the patient's deficit--supported by PT eval -Serum S34--196 -Folic QIWL--7.9 -GXQ--119 -Urinalysis 21-50 WBC -Urine drug screen -TSH 0.276 -Free T4--0.85 -Discontinued Zanaflex -CRP<0.8 -ESR 30 -PT eval-->HHPT -05/06/19--case discussed with neurosurgery (Nundkumar)--continued nonoperative management--MRI findings do not support clinical presentation;  Follow up in office -appreciate neurology consult>>continue IV steroids x 3-5 days per neurology  Hypophonia -05/06/19--back to normal -discussed with speech therapy-->likely functional  Uncontrolled diabetes mellitus type 2, with hyperglycemia - steroid induced -03/04/2019 hemoglobin A1c 9.8 -increased Lantus to 55 units -increased novolog 16 units with meals -NovoLog sliding scale -Holding glipizide  CBG (last 3)  Recent Labs    05/08/19 0256 05/08/19 0712 05/08/19 1127  GLUCAP 161* 211* 285*    Paroxysmal atrial fibrillation -Continue apixaban -Continue carvedilol -Rate controlled  CKD stage IIIa -Baseline creatinine 1.0-1.3  Hyperlipidemia -Continue statin and WelChol  Essential hypertension -Continue carvedilol-->increase to 6.25 mg bid -restart hydralazine   Disposition Plan:   Home when cleared by neurology Family Communication:   daughter by telephone   Consultants:  neuro  Code Status:  FULL  DVT Prophylaxis:  apixaban   Procedures: As Listed in Progress Adams Above  Antibiotics: None  Subjective: Pt reports having a headache and had some dizziness and vertigo symptoms this morning.    Objective: Vitals:    05/08/19 0519 05/08/19 0856 05/08/19 1042 05/08/19 1325  BP: (!) 160/73 [name])  175/70 (!) 158/62 (!) 152/61  Pulse: (!) 54 65 (!) 54 (!) 53  Resp: 20 18  16   Temp: 97.8 F (36.6 C)     TempSrc: Oral     SpO2: 97% 99%  98%  Weight: 97.3 kg     Height:        Intake/Output Summary (Last 24 hours) at 05/08/2019 1408 Last data filed at 05/08/2019 0913 Gross per 24 hour  Intake 650 ml  Output 450 ml  Net 200 ml   Weight change:  Exam:   General:  Pt is alert, follows commands appropriately, not in acute distress  HEENT: No icterus, No thrush, No neck mass, [place]/AT  Cardiovascular: RRR, S1/S2, no rubs, no gallops  Respiratory: CTA bilaterally, no wheezing, no crackles, no rhonchi  Abdomen: Soft/+BS, non tender, non distended, no guarding  Extremities: No edema, No lymphangitis, No petechiae, No rashes, no synovitis  Data Reviewed: I have personally reviewed following labs and imaging studies Basic Metabolic Panel: Recent Labs  Lab 05/04/19 1709 05/05/19 0448 05/07/19 0554 05/08/19 0917  NA 139  --  136 136  K 4.4  --  4.6 5.5*  CL 109  --  105 104  CO2 25  --  22 22  GLUCOSE 119*  --  394* 319*  BUN 22  --  36* 39*  CREATININE 1.13*  --  1.34* 1.32*  CALCIUM 9.9  --  9.9 10.3  MG  --  2.3  --   --    Liver Function Tests: Recent Labs  Lab 05/04/19 1709  AST 33  ALT 22  ALKPHOS 85  BILITOT 0.5  PROT 7.4  ALBUMIN 3.7   No results for input(s): LIPASE, AMYLASE in the last 168 hours. No results for input(s): AMMONIA in the last 168 hours. Coagulation Profile: Recent Labs  Lab 05/04/19 1709  INR 1.2   CBC: Recent Labs  Lab 05/04/19 1709  WBC 5.7  NEUTROABS 4.1  HGB 13.5  HCT 40.2  MCV 83.2  PLT 191   Cardiac Enzymes: Recent Labs  Lab 05/05/19 0448 05/06/19 2244  CKTOTAL 147 108  CKMB  --  3.9   BNP: Invalid input(s): POCBNP CBG: Recent Labs  Lab 05/07/19 2121 05/08/19 0050 05/08/19 0256 05/08/19 0712 05/08/19 1127  GLUCAP 245* 82  161* 211* 285*   HbA1C: No results for input(s): HGBA1C in the last 72 hours. Urine analysis:    Component Value Date/Time   COLORURINE YELLOW 05/05/2019 0821   APPEARANCEUR CLEAR 05/05/2019 0821   LABSPEC 1.019 05/05/2019 0821   PHURINE 5.0 05/05/2019 0821   GLUCOSEU 150 (A) 05/05/2019 0821   HGBUR NEGATIVE 05/05/2019 0821   BILIRUBINUR NEGATIVE 05/05/2019 0821   KETONESUR NEGATIVE 05/05/2019 0821   PROTEINUR 30 (A) 05/05/2019 0821   UROBILINOGEN 0.2 09/15/2012 1010   NITRITE NEGATIVE 05/05/2019 0821   LEUKOCYTESUR LARGE (A) 05/05/2019 0821    Recent Results (from the past 240 hour(s))  SARS CORONAVIRUS 2 (TAT 6-24 HRS) Nasopharyngeal Nasopharyngeal Swab     Status: None   Collection Time: 05/04/19  7:20 PM   Specimen: Nasopharyngeal Swab  Result Value Ref Range Status   SARS Coronavirus 2 NEGATIVE NEGATIVE Final    Comment: (Adams) SARS-CoV-2 target nucleic acids are NOT DETECTED. The SARS-CoV-2 RNA is generally detectable in upper and lower respiratory specimens during the acute phase of infection. Negative results do not preclude SARS-CoV-2 infection, do not rule out co-infections with other pathogens, and should not be used as  the sole basis for treatment or other patient management decisions. Negative results must be combined with clinical observations, patient history, and epidemiological information. The expected result is Negative. Fact Sheet for Patients: SugarRoll.be Fact Sheet for Healthcare Providers: https://www.woods-mathews.com/ This test is not yet approved or cleared by the [place] FDA and  has been authorized for detection and/or diagnosis of SARS-CoV-2 by FDA under an Emergency Use Authorization (EUA). This EUA will remain  in effect (meaning this test can be used) for the duration of the COVID-19 declaration under Section 56 4(b)(1) of the Act, 21 U.S.C. section 360bbb-3(b)(1), unless the authorization is  terminated or revoked sooner. Performed at [facility], [place] [address] [place], [place] [address]   Culture, Urine     Status: Abnormal   Collection Time: 05/05/19  8:21 AM   Specimen: Urine, Clean Catch  Result Value Ref Range Status   Specimen Description   Final    URINE, CLEAN CATCH Performed at [facility], [address], [place], [place] [address]    Special Requests   Final    NONE Performed at [facility], [address], [place], [place] [address]    Culture MULTIPLE SPECIES PRESENT, SUGGEST RECOLLECTION (A)  Final   Report Status 05/07/2019 FINAL  Final     Scheduled Meds:  amLODipine  10 mg Oral Daily   apixaban  5 mg Oral BID   aspirin EC  81 mg Oral Daily   atorvastatin  80 mg Oral QPM   carvedilol  6.25 mg Oral BID WC   colesevelam  1,875 mg Oral BID WC   hydrALAZINE  50 mg Oral Q8H   insulin aspart  0-20 Units Subcutaneous TID WC   insulin aspart  0-5 Units Subcutaneous QHS   insulin aspart  14 Units Subcutaneous TID WC   insulin glargine  55 Units Subcutaneous QHS   pantoprazole  40 mg Oral BID   tiZANidine  4 mg Oral QHS   Continuous Infusions:  sodium chloride 50 mL/hr at 05/08/19 1330   methylPREDNISolone (SOLU-MEDROL) injection 500 mg (05/08/19 0913)    Procedures/Studies: Dg Chest 2 View  Result Date: 04/12/2019 CLINICAL DATA:  Acute chest pain following fall.  Initial encounter. EXAM: CHEST - 2 VIEW COMPARISON:  None. FINDINGS: The cardiomediastinal silhouette is unremarkable. Elevated RIGHT hemidiaphragm again noted. There is no evidence of focal airspace disease, pulmonary edema, suspicious pulmonary nodule/mass, pleural effusion, or pneumothorax. No acute bony abnormalities are identified. IMPRESSION: No active cardiopulmonary disease. Electronically Signed   By: Margarette Canada M.D.   On: 04/12/2019 14:32   Dg Pelvis 1-2 Views  Result Date: 04/12/2019 CLINICAL DATA:  Acute pelvic pain following fall. Initial encounter.  01/14/2019 and prior radiographs EXAM: PELVIS - 1-2 VIEW COMPARISON:  None. FINDINGS: No acute fracture, subluxation or dislocation. Mild degenerative changes in both hips noted. A calcified fibroid is again identified. Degenerative changes in the LOWER lumbar spine again noted. IMPRESSION: No acute abnormality. Electronically Signed   By: Margarette Canada M.D.   On: 04/12/2019 14:34   Ct Head Wo Contrast  Result Date: 05/04/2019 CLINICAL DATA:  Possible stroke, weakness, right-sided weakness and numbness EXAM: CT HEAD WITHOUT CONTRAST TECHNIQUE: Contiguous axial images were obtained from the base of the skull through the vertex without intravenous contrast. COMPARISON:  04/12/2019 FINDINGS: Brain: No evidence of acute infarction, hemorrhage, hydrocephalus, extra-axial collection or mass lesion/mass effect. Vascular: No hyperdense vessel or unexpected calcification. Skull: Normal. Negative for fracture or focal lesion.  Sinuses/Orbits: No acute finding. Other: There is a soft tissue contusion of the left forehead, which is improved compared to examination dated 04/12/2019. IMPRESSION: 1.  No acute intracranial pathology. 2. There is a soft tissue contusion of the left forehead, which is improved compared to examination dated 04/12/2019. Electronically Signed   By: Eddie Candle M.D.   On: 05/04/2019 16:35   Ct Head Wo Contrast  Result Date: 04/12/2019 CLINICAL DATA:  Facial trauma. EXAM: CT HEAD WITHOUT CONTRAST CT MAXILLOFACIAL WITHOUT CONTRAST TECHNIQUE: Multidetector CT imaging of the head and maxillofacial structures were performed using the standard protocol without intravenous contrast. Multiplanar CT image reconstructions of the maxillofacial structures were also generated. COMPARISON:  February 26, 2019 head CT. FINDINGS: CT HEAD FINDINGS Brain: No subdural, epidural, or subarachnoid hemorrhage. Cerebellum, brainstem, and basal cisterns are normal. Ventricles are normal. No mass effect or midline shift. No  acute cortical ischemia or infarct identified. Foci of high attenuation left basal ganglia are stable, consistent with calcifications. Vascular: No hyperdense vessel or unexpected calcification. Skull: Normal. Negative for fracture or focal lesion. Other: Soft tissue swelling is seen in the left periorbital region, extending into the forehead with a hematoma. Left globe is intact. No other soft tissue abnormalities are noted. CT MAXILLOFACIAL FINDINGS Osseous: No fracture or mandibular dislocation. No destructive process. Orbits: The orbits are normal in appearance.  The globes are intact. Sinuses: Chronic sinus disease is seen in the right maxillary sinus. No acute sinus disease noted. No sinus wall fractures are identified. Mastoid air cells and middle ears are well aerated. Soft tissues: There is soft tissue swelling in the left periorbital region, extending into the forehead with a hematoma. Other soft tissues are unremarkable. IMPRESSION: 1. Soft tissue swelling in the left periorbital region. Hematoma over the left forehead. 2. The left globe is intact. 3. No facial bone fractures or calvarial fractures noted. 4. No acute intracranial abnormalities are noted. Electronically Signed   By: Dorise Bullion III M.D   On: 04/12/2019 13:18   Mr Brain W Wo Contrast  Result Date: 05/05/2019 CLINICAL DATA:  Right-sided weakness EXAM: MRI HEAD WITHOUT AND WITH CONTRAST TECHNIQUE: Multiplanar, multiecho pulse sequences of the brain and surrounding structures were obtained without and with intravenous contrast. CONTRAST:  29m GADAVIST GADOBUTROL 1 MMOL/ML IV SOLN COMPARISON:  February 27, 2019 FINDINGS: Brain: There is no acute infarction or intracranial hemorrhage. There is no intracranial mass, mass effect, edema, hydrocephalus, or extra-axial fluid collection. Patchy T2 hyperintensity in the supratentorial white matter is nonspecific but may reflect stable mild chronic microvascular ischemic changes. Ventricles and  sulci are normal in size. Vascular: Major vessel flow voids at the skull base are preserved. Skull and upper cervical spine: Marrow signal is within normal limits. Cervical spine is better evaluated on concurrent dedicated imaging. Sinuses/Orbits: Mild paranasal sinus mucosal thickening. Bilateral lens replacements. Other: Left frontal scalp hematoma as seen on prior CT imaging. Trace right mastoid fluid opacification. IMPRESSION: No evidence of acute infarction, intracranial hemorrhage, or mass. Stable mild chronic microvascular ischemic changes. Electronically Signed   By: PMacy MisM.D.   On: 05/05/2019 11:13   Mr Thoracic Spine Wo Contrast  Result Date: 05/06/2019 CLINICAL DATA:  Progressive ataxia. Upper back pain for 2 months. EXAM: MRI THORACIC SPINE WITHOUT CONTRAST TECHNIQUE: Multiplanar, multisequence MR imaging of the thoracic spine was performed. No intravenous contrast was administered. COMPARISON:  Chest x-ray dated 04/12/2019 FINDINGS: Alignment: 2 mm retrolisthesis of T11 on T12. Alignment is otherwise normal.  Vertebrae: Ankylosis of the T10 and T11 vertebral bodies. No fracture, evidence of discitis, or bone lesion. Cord:  Normal signal and morphology.  Tip of the conus is at L1. Paraspinal and other soft tissues: Negative. Disc levels: T1-2 through T6-7: No significant disc bulging or disc protrusion. No spinal or foraminal stenosis. T7-8: Tiny central disc bulge with no neural impingement. Otherwise negative. T8-9 and T9-10: Normal. T10-11: Marked disc space narrowing. Small broad-based endplate osteophytes symmetrically indent the ventral aspect of the thecal sac without focal neural impingement. There is severe left foraminal stenosis which could affect the left T10 nerve. There is ankylosis of the T10 and T11 vertebral bodies. T11-12: Marked disc space narrowing. Slight retrolisthesis. Small broad-based endplate osteophytes do not create significant spinal stenosis. No significant  foraminal stenosis. T12-L1: Negative. IMPRESSION: 1. Severe left foraminal stenosis at T10-11 which could affect the left T10 nerve. 2. Ankylosis of the T10 and T11 vertebral bodies. 3. No other significant abnormalities. Specifically, no evidence to explain the patient's progressive ataxia. Electronically Signed   By: Lorriane Shire M.D.   On: 05/06/2019 09:57   Mr Cervical Spine W Wo Contrast  Result Date: 05/05/2019 CLINICAL DATA:  Radiculopathy, right-sided weakness EXAM: MRI CERVICAL SPINE WITHOUT AND WITH CONTRAST TECHNIQUE: Multiplanar and multiecho pulse sequences of the cervical spine, to include the craniocervical junction and cervicothoracic junction, were obtained without and with intravenous contrast. CONTRAST:  81m GADAVIST GADOBUTROL 1 MMOL/ML IV SOLN COMPARISON:  None. FINDINGS: Alignment: Straightening of the cervical lordosis. There is focal kyphosis at C5-C6. Vertebrae: Vertebral body heights are maintained apart from anterior wedging and endplate irregularity at C5 and C6. There is no significant marrow edema. No suspicious osseous lesion. Cord: No abnormal cord signal. Posterior Fossa, vertebral arteries, paraspinal tissues: Unremarkable. Disc levels: C2-C3:  Disc bulge.  No significant canal or foraminal stenosis. C3-C4: Small central disc protrusion. Mild canal stenosis. No foraminal stenosis. C4-C5: Very small central disc protrusion. Mild facet and uncovertebral hypertrophy. No canal or foraminal stenosis. C5-C6: Right central disc extrusion extending below the disc level, endplate osteophytes, and facet and uncovertebral hypertrophy. Mild canal stenosis with flattening of the right ventral aspect of the cord. No foraminal stenosis. C6-C7: Disc bulge, endplate osteophytes, and facet and uncovertebral hypertrophy. Moderate canal stenosis. Marked foraminal stenosis, right greater than left. C7-T1:  Facet hypertrophy.  No canal or foraminal stenosis. IMPRESSION: Multilevel degenerative  changes as detailed above, greatest at C5-C6 and C6-C7. Electronically Signed   By: PMacy MisM.D.   On: 05/05/2019 11:31   Mr Lumbar Spine W Wo Contrast  Result Date: 05/05/2019 CLINICAL DATA:  Radiculopathy, right-sided weakness EXAM: MRI LUMBAR SPINE WITHOUT AND WITH CONTRAST TECHNIQUE: Multiplanar and multiecho pulse sequences of the lumbar spine were obtained without and with intravenous contrast. CONTRAST:  745mGADAVIST GADOBUTROL 1 MMOL/ML IV SOLN COMPARISON:  None. FINDINGS: Motion artifact is present. Segmentation: For the purposes of this dictation, there are 5 lumbar type vertebral bodies with the caudal most designated L5. Alignment:  There is mild anterolisthesis at L3-L4. Vertebrae: Vertebral body heights are maintained apart from degenerative endplate irregularity and Schmorl's nodes, greatest at L1-L2 and L2-L3. There is degenerative endplate marrow edema at the levels as well. Degenerative marrow edema is also present at the right L4-L5 facet joint. Conus medullaris and cauda equina: Conus extends to the L1 level. Conus and cauda equina appear normal. Paraspinal and other soft tissues: Unremarkable. Disc levels: L1-L2: Disc bulge and facet arthropathy with ligamentum flavum infolding.  Mild canal stenosis. Mild to moderate right and mild left foraminal stenosis. L2-L3: Disc bulge and facet arthropathy (marked on the right) with ligamentum flavum infolding. Moderate canal stenosis with narrowing of the lateral recesses. Mild foraminal stenosis. L3-L4: Anterolisthesis with uncovering of disc bulge and marked facet arthropathy with ligamentum flavum infolding. Marked canal stenosis with narrowing of the lateral recesses. Moderate to marked right and mild left foraminal stenosis. L4-L5: Disc bulge with endplate osteophytic ridging. Moderate to marked facet arthropathy with joint effusion on the right and ligamentum flavum infolding. Moderate canal stenosis with narrowing of the lateral recesses.  Moderate to marked foraminal stenosis. L5-S1: Small right far lateral disc protrusion. Moderate facet arthropathy. No canal stenosis. Mild right foraminal stenosis. No left foraminal stenosis. IMPRESSION: Multilevel degenerative changes as detailed above. Canal and lateral recess stenosis are greatest at L3-L4. Right foraminal stenosis is greatest at L3-L4 and L4-L5. Electronically Signed   By: Macy Mis M.D.   On: 05/05/2019 11:44   Ct Maxillofacial Wo Contrast  Result Date: 04/12/2019 CLINICAL DATA:  Facial trauma. EXAM: CT HEAD WITHOUT CONTRAST CT MAXILLOFACIAL WITHOUT CONTRAST TECHNIQUE: Multidetector CT imaging of the head and maxillofacial structures were performed using the standard protocol without intravenous contrast. Multiplanar CT image reconstructions of the maxillofacial structures were also generated. COMPARISON:  February 26, 2019 head CT. FINDINGS: CT HEAD FINDINGS Brain: No subdural, epidural, or subarachnoid hemorrhage. Cerebellum, brainstem, and basal cisterns are normal. Ventricles are normal. No mass effect or midline shift. No acute cortical ischemia or infarct identified. Foci of high attenuation left basal ganglia are stable, consistent with calcifications. Vascular: No hyperdense vessel or unexpected calcification. Skull: Normal. Negative for fracture or focal lesion. Other: Soft tissue swelling is seen in the left periorbital region, extending into the forehead with a hematoma. Left globe is intact. No other soft tissue abnormalities are noted. CT MAXILLOFACIAL FINDINGS Osseous: No fracture or mandibular dislocation. No destructive process. Orbits: The orbits are normal in appearance.  The globes are intact. Sinuses: Chronic sinus disease is seen in the right maxillary sinus. No acute sinus disease noted. No sinus wall fractures are identified. Mastoid air cells and middle ears are well aerated. Soft tissues: There is soft tissue swelling in the left periorbital region, extending  into the forehead with a hematoma. Other soft tissues are unremarkable. IMPRESSION: 1. Soft tissue swelling in the left periorbital region. Hematoma over the left forehead. 2. The left globe is intact. 3. No facial bone fractures or calvarial fractures noted. 4. No acute intracranial abnormalities are noted. Electronically Signed   By: Dorise Bullion III M.D   On: 04/12/2019 13:18    Irwin Brakeman, MD  [facility] How to contact the [facility] Attending or Consulting provider [place] or covering provider during after hours [place], for this patient?  1. Check the care team in [facility] and look for a) attending/consulting [facility] provider listed and b) the [facility] team listed 2. Log into [email or web address] and use [place]'s universal password to access. If you do not have the password, please contact the hospital operator. 3. Locate the [facility] provider you are looking for under [facility] and page to a number that you can be directly reached. 4. If you still have difficulty reaching the provider, please page the [facility] (Director on Call) for the Hospitalists listed on amion for assistance.   If 7PM-7AM, please contact night-coverage [email or web address] Password [email or web address] 05/08/2019, 2:08 PM   LOS: 3 days

## 2019-05-08 NOTE — Progress Notes (Signed)
Pt complains of feeling "woozy" headed since about 5:30 am. States she has had similar experience before at home when she had vertigo. States feels like the room is spinning. Denies nausea, states slight frontal headache. Also states right hand feels numb, strong grip and movement, states just feels "numb". Pt A&O x4. MD notified.

## 2019-05-09 ENCOUNTER — Ambulatory Visit: Payer: Medicare HMO | Admitting: Internal Medicine

## 2019-05-09 LAB — BASIC METABOLIC PANEL
Anion gap: 6 (ref 5–15)
BUN: 37 mg/dL — ABNORMAL HIGH (ref 8–23)
CO2: 26 mmol/L (ref 22–32)
Calcium: 9.5 mg/dL (ref 8.9–10.3)
Chloride: 110 mmol/L (ref 98–111)
Creatinine, Ser: 1.41 mg/dL — ABNORMAL HIGH (ref 0.44–1.00)
GFR calc Af Amer: 42 mL/min — ABNORMAL LOW (ref >60)
GFR calc non Af Amer: 37 mL/min — ABNORMAL LOW (ref >60)
Glucose, Bld: 102 mg/dL — ABNORMAL HIGH (ref 70–99)
Potassium: 4.7 mmol/L (ref 3.5–5.1)
Sodium: 142 mmol/L (ref 135–145)

## 2019-05-09 LAB — GLUCOSE, CAPILLARY
Glucose-Capillary: 86 mg/dL (ref 70–99)
Glucose-Capillary: 140 mg/dL — ABNORMAL HIGH (ref 70–99)
Glucose-Capillary: 304 mg/dL — ABNORMAL HIGH (ref 70–99)
Glucose-Capillary: 262 mg/dL — ABNORMAL HIGH (ref 70–99)
Glucose-Capillary: 110 mg/dL — ABNORMAL HIGH (ref 70–99)

## 2019-05-09 MED ORDER — INSULIN GLARGINE 100 UNIT/ML ~~LOC~~ SOLN
30.0000 [IU] | Freq: Every day | SUBCUTANEOUS | Status: DC
  Administered 2019-05-09 – 2019-05-11 (×3): 30 [IU] via SUBCUTANEOUS
  Filled 2019-05-09 (×4): qty 0.3

## 2019-05-09 MED ORDER — INSULIN GLARGINE 100 UNIT/ML ~~LOC~~ SOLN
30.0000 [IU] | Freq: Every day | SUBCUTANEOUS | Status: DC
  Filled 2019-05-09 (×4): qty 0.3

## 2019-05-09 MED ORDER — INSULIN ASPART 100 UNIT/ML ~~LOC~~ SOLN
20.0000 [IU] | Freq: Three times a day (TID) | SUBCUTANEOUS | Status: DC
  Administered 2019-05-09 – 2019-05-10 (×4): 20 [IU] via SUBCUTANEOUS

## 2019-05-09 NOTE — Progress Notes (Signed)
Inpatient Diabetes Program Recommendations  AACE/ADA: New Consensus Statement on Inpatient Glycemic Control (2015)  Target Ranges:  Prepandial:   less than 140 mg/dL      Peak postprandial:   less than 180 mg/dL (1-2 hours)      Critically ill patients:  140 - 180 mg/dL   Lab Results  Component Value Date   GLUCAP 304 (H) 05/09/2019   HGBA1C 8.3 (H) 05/04/2019    Review of Glycemic Control Results for Cassandra Adams, Cassandra Adams (MRN [id number]) as of 05/09/2019 11:54  Ref. Range 05/08/2019 20:31 05/09/2019 04:18 05/09/2019 07:53 05/09/2019 11:33  Glucose-Capillary Latest Ref Range: 70 - 99 mg/dL 102 (H) 86 140 (H) 304 (H)   Diabetes history: Type 2 DM Outpatient Diabetes medications: Glipizide 5 mg QAM, Basaglar 36 units QHS, Humalog 10 units TID Current orders for Inpatient glycemic control: Lantus 60 units QHS, Novolog 0-20 units TID, Novolog 20 units TID, Novolog 0-5 units QHS Solumedrol 500 mg QD  Inpatient Diabetes Program Recommendations:    Noted patient missed QHS dose of Lantus last night, patient refused. Plan to continue with steroids.  Consider splitting today's Lantus dose to 30 units BID.   Of note: Insulin dosages must be given within one hour of last CBG.   Thanks, Bronson Curb, MSN, RNC-OB Diabetes Coordinator [phone] (8a-5p)

## 2019-05-09 NOTE — Care Management Important Message (Signed)
Important Message  Patient Details  Name: Cassandra Adams MRN: [id number] Date of Birth: [birth year]   Medicare Important Message Given:  Yes     Tommy Medal 05/09/2019, 3:12 PM

## 2019-05-09 NOTE — Plan of Care (Signed)
  Problem: Education: Goal: Knowledge of General Education information will improve Description: Including pain rating scale, medication(s)/side effects and non-pharmacologic comfort measures Outcome: Progressing   Problem: Health Behavior/Discharge Planning: Goal: Ability to manage health-related needs will improve Outcome: Progressing   Problem: Clinical Measurements: Goal: Ability to maintain clinical measurements within normal limits will improve Outcome: Progressing Goal: Will remain free from infection Outcome: Progressing Goal: Diagnostic test results will improve Outcome: Progressing Goal: Respiratory complications will improve Outcome: Progressing Goal: Cardiovascular complication will be avoided Outcome: Progressing   Problem: Activity: Goal: Risk for activity intolerance will decrease Outcome: Progressing   Problem: Nutrition: Goal: Adequate nutrition will be maintained Outcome: Progressing   Problem: Coping: Goal: Level of anxiety will decrease Outcome: Progressing   Problem: Elimination: Goal: Will not experience complications related to bowel motility Outcome: Progressing Goal: Will not experience complications related to urinary retention Outcome: Progressing   Problem: Pain Managment: Goal: General experience of comfort will improve Outcome: Progressing   Problem: Safety: Goal: Ability to remain free from injury will improve Outcome: Progressing   Problem: Skin Integrity: Goal: Risk for impaired skin integrity will decrease Outcome: Progressing   Problem: Education: Goal: Knowledge of disease or condition will improve Outcome: Progressing Goal: Knowledge of secondary prevention will improve Outcome: Progressing Goal: Knowledge of patient specific risk factors addressed and post discharge goals established will improve Outcome: Progressing Goal: Individualized Educational Video(s) Outcome: Progressing   Problem: Coping: Goal: Will verbalize  positive feelings about self Outcome: Progressing Goal: Will identify appropriate support needs Outcome: Progressing   Problem: Health Behavior/Discharge Planning: Goal: Ability to manage health-related needs will improve Outcome: Progressing   Problem: Self-Care: Goal: Ability to participate in self-care as condition permits will improve Outcome: Progressing Goal: Verbalization of feelings and concerns over difficulty with self-care will improve Outcome: Progressing Goal: Ability to communicate needs accurately will improve Outcome: Progressing   Problem: Nutrition: Goal: Risk of aspiration will decrease Outcome: Progressing   Problem: Ischemic Stroke/TIA Tissue Perfusion: Goal: Complications of ischemic stroke/TIA will be minimized Outcome: Progressing

## 2019-05-09 NOTE — Progress Notes (Signed)
[place] PROGRESS NOTE  Cassandra Adams AGT:[id number] DOB: [birth year] DOA: 05/04/2019 PCP: Redmond School, MD  Brief History:  73 year old female with a history of CKD stage III, diabetes mellitus type 2, hypertension, TIA, hyperlipidemia, coronary disease, paroxysmal atrial fibrillation, and TIA presenting with right leg weakness which she first noticed on 05/04/2019 around 4:30 AM.  The patient was walking back to her bedroom at that time.  She went back to sleep.  She got up around noontime on 05/04/2019 and still felt her right leg to be weak.  The patient also felt like she had some right upper extremity weakness with numbness and tingling in her right hand.  She denied any visual disturbance, headache, new focal extremity weakness.  She denies any dysarthria or word finding difficulty.  As result, the patient presented for further evaluation.  Notably, the patient recently had a hospitalization from 02/26/2019 through 02/27/2019 during which time she had left upper extremity weakness and difficulty with phonation.  MRI of the brain at that time was negative.  She subsequently followed up with ENT, Dr. Benjamine Mola.  Apparently, her vocal cords were felt to be normal, and the patient was referred to [facility].  The patient states that her phonation has not really changed.  She denies any headache, visual disturbance, fever, chills, chest pain, shortness breath, nausea, vomiting, diarrhea.  She denies any back pain, recent falls or injuries, or right leg pain. In the emergency department, the patient had low-grade temperature 99.4 F.  She was hemodynamically stable saturating 97% room air.  BMP, LFTs, and CBC were essentially unremarkable.  CT of the brain was negative for acute intracranial abnormalities.   Assessment/Plan: Right lower extremity weakness/sensory disturbance highly suggestive of myelopathy -MRI her lumbar spine--Canal and lateral recess stenosis are greatest at L3-L4. Right  foraminal stenosis is greatest at L3-L4 and L4-L5. -MRI T-spine-->Severe left foraminal stenosis at T10-11 which could affect the left T10 nerve. Ankylosis of the T10 and T11 vertebral bodies. -MRI-C-spine--Multilevel degenerative changes as detailed above, greatest at C5-C6 and C6-C7. -MRI-Brain--no acute infarct -Clinical exam reveals some functional components to the patient's deficit--supported by PT eval -Serum Y24--825 -Folic OIBB--0.4 -UGQ--916 -Urinalysis 21-50 WBC -Urine drug screen -TSH 0.276 -Free T4--0.85 -Discontinued Zanaflex -CRP<0.8 -ESR 30 -PT eval-->HHPT -05/06/19--case discussed with neurosurgery (Nundkumar)--continued nonoperative management--MRI findings do not support clinical presentation;  Follow up in office -appreciate neurology consult>>continue IV steroids for a couple more days per neurology  Hypophonia -05/06/19--back to normal -discussed with speech therapy-->likely functional  Uncontrolled diabetes mellitus type 2, with hyperglycemia - steroid induced -03/04/2019 hemoglobin A1c 9.8 -increased Lantus to 55 units -increased novolog 16 units with meals -NovoLog sliding scale -Holding glipizide while inpatient  CBG (last 3)  Recent Labs    05/09/19 0418 05/09/19 0753 05/09/19 1133  GLUCAP 86 140* 304*   Paroxysmal atrial fibrillation -Continue apixaban -Continue carvedilol -Rate controlled  CKD stage IIIa -Baseline creatinine 1.0-1.3  Hyperlipidemia -Continue statin and WelChol  Essential hypertension -Continue carvedilol-->increase to 6.25 mg bid -restart hydralazine   Disposition Plan:   Home when cleared by neurology with HHPT Family Communication:   daughter by telephone   Consultants:  neuro  Code Status:  FULL  DVT Prophylaxis:  apixaban   Procedures: As Listed in Progress Note Above  Antibiotics: None  Subjective: Pt reports that her headache is a little better today.    Objective: Vitals:    05/08/19 2115 05/09/19 0115 05/09/19 0419 05/09/19 0521  BP: [name])  133/50 (!) 137/53 (!) 148/61 (!) 138/58  Pulse: (!) 57 (!) 54 (!) 55 (!) 54  Resp: 16 18 16 18   Temp: 99.4 F (37.4 C) 98.4 F (36.9 C) 97.8 F (36.6 C) 98 F (36.7 C)  TempSrc: Oral Oral Oral Oral  SpO2: 100% 99% 98% 99%  Weight:      Height:        Intake/Output Summary (Last 24 hours) at 05/09/2019 1219 Last data filed at 05/09/2019 0400 Gross per 24 hour  Intake 1157.2 ml  Output --  Net 1157.2 ml   Weight change:  Exam:   General:  Pt is alert, follows commands appropriately, not in acute distress  HEENT: No icterus, No thrush, No neck mass, [place]/AT  Cardiovascular: RRR, S1/S2, no rubs, no gallops  Respiratory: CTA bilaterally, no wheezing, no crackles, no rhonchi  Abdomen: Soft/+BS, non tender, non distended, no guarding  Extremities: No edema, No lymphangitis, No petechiae, No rashes, no synovitis  Neurological: no changes from prior.  Data Reviewed: I have personally reviewed following labs and imaging studies Basic Metabolic Panel: Recent Labs  Lab 05/04/19 1709 05/05/19 0448 05/07/19 0554 05/08/19 0917 05/09/19 0509  NA 139  --  136 136 142  K 4.4  --  4.6 5.5* 4.7  CL 109  --  105 104 110  CO2 25  --  22 22 26   GLUCOSE 119*  --  394* 319* 102*  BUN 22  --  36* 39* 37*  CREATININE 1.13*  --  1.34* 1.32* 1.41*  CALCIUM 9.9  --  9.9 10.3 9.5  MG  --  2.3  --   --   --    Liver Function Tests: Recent Labs  Lab 05/04/19 1709  AST 33  ALT 22  ALKPHOS 85  BILITOT 0.5  PROT 7.4  ALBUMIN 3.7   No results for input(s): LIPASE, AMYLASE in the last 168 hours. No results for input(s): AMMONIA in the last 168 hours. Coagulation Profile: Recent Labs  Lab 05/04/19 1709  INR 1.2   CBC: Recent Labs  Lab 05/04/19 1709  WBC 5.7  NEUTROABS 4.1  HGB 13.5  HCT 40.2  MCV 83.2  PLT 191   Cardiac Enzymes: Recent Labs  Lab 05/05/19 0448 05/06/19 2244  CKTOTAL 147 108  CKMB   --  3.9   BNP: Invalid input(s): POCBNP CBG: Recent Labs  Lab 05/08/19 1643 05/08/19 2031 05/09/19 0418 05/09/19 0753 05/09/19 1133  GLUCAP 222* 102* 86 140* 304*   HbA1C: No results for input(s): HGBA1C in the last 72 hours. Urine analysis:    Component Value Date/Time   COLORURINE YELLOW 05/05/2019 0821   APPEARANCEUR CLEAR 05/05/2019 0821   LABSPEC 1.019 05/05/2019 0821   PHURINE 5.0 05/05/2019 0821   GLUCOSEU 150 (A) 05/05/2019 0821   HGBUR NEGATIVE 05/05/2019 0821   BILIRUBINUR NEGATIVE 05/05/2019 0821   KETONESUR NEGATIVE 05/05/2019 0821   PROTEINUR 30 (A) 05/05/2019 0821   UROBILINOGEN 0.2 09/15/2012 1010   NITRITE NEGATIVE 05/05/2019 0821   LEUKOCYTESUR LARGE (A) 05/05/2019 0821    Recent Results (from the past 240 hour(s))  SARS CORONAVIRUS 2 (TAT 6-24 HRS) Nasopharyngeal Nasopharyngeal Swab     Status: None   Collection Time: 05/04/19  7:20 PM   Specimen: Nasopharyngeal Swab  Result Value Ref Range Status   SARS Coronavirus 2 NEGATIVE NEGATIVE Final    Comment: (NOTE) SARS-CoV-2 target nucleic acids are NOT DETECTED. The SARS-CoV-2 RNA is generally detectable in upper and lower  respiratory specimens during the acute phase of infection. Negative results do not preclude SARS-CoV-2 infection, do not rule out co-infections with other pathogens, and should not be used as the sole basis for treatment or other patient management decisions. Negative results must be combined with clinical observations, patient history, and epidemiological information. The expected result is Negative. Fact Sheet for Patients: SugarRoll.be Fact Sheet for Healthcare Providers: https://www.woods-mathews.com/ This test is not yet approved or cleared by the [place] FDA and  has been authorized for detection and/or diagnosis of SARS-CoV-2 by FDA under an Emergency Use Authorization (EUA). This EUA will remain  in effect (meaning this test  can be used) for the duration of the COVID-19 declaration under Section 56 4(b)(1) of the Act, 21 U.S.C. section 360bbb-3(b)(1), unless the authorization is terminated or revoked sooner. Performed at [facility], [place] [address], [place], [place] [address]   Culture, Urine     Status: Abnormal   Collection Time: 05/05/19  8:21 AM   Specimen: Urine, Clean Catch  Result Value Ref Range Status   Specimen Description   Final    URINE, CLEAN CATCH Performed at [facility], [address] [place], [place] [address]    Special Requests   Final    NONE Performed at [facility], [address], [place], [place] [address]    Culture MULTIPLE SPECIES PRESENT, SUGGEST RECOLLECTION (A)  Final   Report Status 05/07/2019 FINAL  Final     Scheduled Meds:  amLODipine  10 mg Oral Daily   apixaban  5 mg Oral BID   aspirin EC  81 mg Oral Daily   atorvastatin  80 mg Oral QPM   carvedilol  6.25 mg Oral BID WC   colesevelam  1,875 mg Oral BID WC   hydrALAZINE  50 mg Oral Q8H   insulin aspart  0-20 Units Subcutaneous TID WC   insulin aspart  0-5 Units Subcutaneous QHS   insulin aspart  18 Units Subcutaneous TID WC   insulin glargine  60 Units Subcutaneous QHS   pantoprazole  40 mg Oral BID   tiZANidine  4 mg Oral QHS   Continuous Infusions:  sodium chloride 75 mL/hr at 05/09/19 0841   methylPREDNISolone (SOLU-MEDROL) injection 500 mg (05/09/19 0840)    Procedures/Studies: Dg Chest 2 View  Result Date: 04/12/2019 CLINICAL DATA:  Acute chest pain following fall.  Initial encounter. EXAM: CHEST - 2 VIEW COMPARISON:  None. FINDINGS: The cardiomediastinal silhouette is unremarkable. Elevated RIGHT hemidiaphragm again noted. There is no evidence of focal airspace disease, pulmonary edema, suspicious pulmonary nodule/mass, pleural effusion, or pneumothorax. No acute bony abnormalities are identified. IMPRESSION: No active cardiopulmonary disease. Electronically Signed   By:  Margarette Canada M.D.   On: 04/12/2019 14:32   Dg Pelvis 1-2 Views  Result Date: 04/12/2019 CLINICAL DATA:  Acute pelvic pain following fall. Initial encounter. 01/14/2019 and prior radiographs EXAM: PELVIS - 1-2 VIEW COMPARISON:  None. FINDINGS: No acute fracture, subluxation or dislocation. Mild degenerative changes in both hips noted. A calcified fibroid is again identified. Degenerative changes in the LOWER lumbar spine again noted. IMPRESSION: No acute abnormality. Electronically Signed   By: Margarette Canada M.D.   On: 04/12/2019 14:34   Ct Head Wo Contrast  Result Date: 05/04/2019 CLINICAL DATA:  Possible stroke, weakness, right-sided weakness and numbness EXAM: CT HEAD WITHOUT CONTRAST TECHNIQUE: Contiguous axial images were obtained from the base of the skull through the vertex without intravenous contrast. COMPARISON:  04/12/2019 FINDINGS:  Brain: No evidence of acute infarction, hemorrhage, hydrocephalus, extra-axial collection or mass lesion/mass effect. Vascular: No hyperdense vessel or unexpected calcification. Skull: Normal. Negative for fracture or focal lesion. Sinuses/Orbits: No acute finding. Other: There is a soft tissue contusion of the left forehead, which is improved compared to examination dated 04/12/2019. IMPRESSION: 1.  No acute intracranial pathology. 2. There is a soft tissue contusion of the left forehead, which is improved compared to examination dated 04/12/2019. Electronically Signed   By: Eddie Candle M.D.   On: 05/04/2019 16:35   Ct Head Wo Contrast  Result Date: 04/12/2019 CLINICAL DATA:  Facial trauma. EXAM: CT HEAD WITHOUT CONTRAST CT MAXILLOFACIAL WITHOUT CONTRAST TECHNIQUE: Multidetector CT imaging of the head and maxillofacial structures were performed using the standard protocol without intravenous contrast. Multiplanar CT image reconstructions of the maxillofacial structures were also generated. COMPARISON:  February 26, 2019 head CT. FINDINGS: CT HEAD FINDINGS Brain: No  subdural, epidural, or subarachnoid hemorrhage. Cerebellum, brainstem, and basal cisterns are normal. Ventricles are normal. No mass effect or midline shift. No acute cortical ischemia or infarct identified. Foci of high attenuation left basal ganglia are stable, consistent with calcifications. Vascular: No hyperdense vessel or unexpected calcification. Skull: Normal. Negative for fracture or focal lesion. Other: Soft tissue swelling is seen in the left periorbital region, extending into the forehead with a hematoma. Left globe is intact. No other soft tissue abnormalities are noted. CT MAXILLOFACIAL FINDINGS Osseous: No fracture or mandibular dislocation. No destructive process. Orbits: The orbits are normal in appearance.  The globes are intact. Sinuses: Chronic sinus disease is seen in the right maxillary sinus. No acute sinus disease noted. No sinus wall fractures are identified. Mastoid air cells and middle ears are well aerated. Soft tissues: There is soft tissue swelling in the left periorbital region, extending into the forehead with a hematoma. Other soft tissues are unremarkable. IMPRESSION: 1. Soft tissue swelling in the left periorbital region. Hematoma over the left forehead. 2. The left globe is intact. 3. No facial bone fractures or calvarial fractures noted. 4. No acute intracranial abnormalities are noted. Electronically Signed   By: Dorise Bullion III M.D   On: 04/12/2019 13:18   Mr Brain W Wo Contrast  Result Date: 05/05/2019 CLINICAL DATA:  Right-sided weakness EXAM: MRI HEAD WITHOUT AND WITH CONTRAST TECHNIQUE: Multiplanar, multiecho pulse sequences of the brain and surrounding structures were obtained without and with intravenous contrast. CONTRAST:  19m GADAVIST GADOBUTROL 1 MMOL/ML IV SOLN COMPARISON:  February 27, 2019 FINDINGS: Brain: There is no acute infarction or intracranial hemorrhage. There is no intracranial mass, mass effect, edema, hydrocephalus, or extra-axial fluid  collection. Patchy T2 hyperintensity in the supratentorial white matter is nonspecific but may reflect stable mild chronic microvascular ischemic changes. Ventricles and sulci are normal in size. Vascular: Major vessel flow voids at the skull base are preserved. Skull and upper cervical spine: Marrow signal is within normal limits. Cervical spine is better evaluated on concurrent dedicated imaging. Sinuses/Orbits: Mild paranasal sinus mucosal thickening. Bilateral lens replacements. Other: Left frontal scalp hematoma as seen on prior CT imaging. Trace right mastoid fluid opacification. IMPRESSION: No evidence of acute infarction, intracranial hemorrhage, or mass. Stable mild chronic microvascular ischemic changes. Electronically Signed   By: PMacy MisM.D.   On: 05/05/2019 11:13   Mr Thoracic Spine Wo Contrast  Result Date: 05/06/2019 CLINICAL DATA:  Progressive ataxia. Upper back pain for 2 months. EXAM: MRI THORACIC SPINE WITHOUT CONTRAST TECHNIQUE: Multiplanar, multisequence MR imaging of  the thoracic spine was performed. No intravenous contrast was administered. COMPARISON:  Chest x-ray dated 04/12/2019 FINDINGS: Alignment: 2 mm retrolisthesis of T11 on T12. Alignment is otherwise normal. Vertebrae: Ankylosis of the T10 and T11 vertebral bodies. No fracture, evidence of discitis, or bone lesion. Cord:  Normal signal and morphology.  Tip of the conus is at L1. Paraspinal and other soft tissues: Negative. Disc levels: T1-2 through T6-7: No significant disc bulging or disc protrusion. No spinal or foraminal stenosis. T7-8: Tiny central disc bulge with no neural impingement. Otherwise negative. T8-9 and T9-10: Normal. T10-11: Marked disc space narrowing. Small broad-based endplate osteophytes symmetrically indent the ventral aspect of the thecal sac without focal neural impingement. There is severe left foraminal stenosis which could affect the left T10 nerve. There is ankylosis of the T10 and T11  vertebral bodies. T11-12: Marked disc space narrowing. Slight retrolisthesis. Small broad-based endplate osteophytes do not create significant spinal stenosis. No significant foraminal stenosis. T12-L1: Negative. IMPRESSION: 1. Severe left foraminal stenosis at T10-11 which could affect the left T10 nerve. 2. Ankylosis of the T10 and T11 vertebral bodies. 3. No other significant abnormalities. Specifically, no evidence to explain the patient's progressive ataxia. Electronically Signed   By: Lorriane Shire M.D.   On: 05/06/2019 09:57   Mr Cervical Spine W Wo Contrast  Result Date: 05/05/2019 CLINICAL DATA:  Radiculopathy, right-sided weakness EXAM: MRI CERVICAL SPINE WITHOUT AND WITH CONTRAST TECHNIQUE: Multiplanar and multiecho pulse sequences of the cervical spine, to include the craniocervical junction and cervicothoracic junction, were obtained without and with intravenous contrast. CONTRAST:  3m GADAVIST GADOBUTROL 1 MMOL/ML IV SOLN COMPARISON:  None. FINDINGS: Alignment: Straightening of the cervical lordosis. There is focal kyphosis at C5-C6. Vertebrae: Vertebral body heights are maintained apart from anterior wedging and endplate irregularity at C5 and C6. There is no significant marrow edema. No suspicious osseous lesion. Cord: No abnormal cord signal. Posterior Fossa, vertebral arteries, paraspinal tissues: Unremarkable. Disc levels: C2-C3:  Disc bulge.  No significant canal or foraminal stenosis. C3-C4: Small central disc protrusion. Mild canal stenosis. No foraminal stenosis. C4-C5: Very small central disc protrusion. Mild facet and uncovertebral hypertrophy. No canal or foraminal stenosis. C5-C6: Right central disc extrusion extending below the disc level, endplate osteophytes, and facet and uncovertebral hypertrophy. Mild canal stenosis with flattening of the right ventral aspect of the cord. No foraminal stenosis. C6-C7: Disc bulge, endplate osteophytes, and facet and uncovertebral hypertrophy.  Moderate canal stenosis. Marked foraminal stenosis, right greater than left. C7-T1:  Facet hypertrophy.  No canal or foraminal stenosis. IMPRESSION: Multilevel degenerative changes as detailed above, greatest at C5-C6 and C6-C7. Electronically Signed   By: PMacy MisM.D.   On: 05/05/2019 11:31   Mr Lumbar Spine W Wo Contrast  Result Date: 05/05/2019 CLINICAL DATA:  Radiculopathy, right-sided weakness EXAM: MRI LUMBAR SPINE WITHOUT AND WITH CONTRAST TECHNIQUE: Multiplanar and multiecho pulse sequences of the lumbar spine were obtained without and with intravenous contrast. CONTRAST:  727mGADAVIST GADOBUTROL 1 MMOL/ML IV SOLN COMPARISON:  None. FINDINGS: Motion artifact is present. Segmentation: For the purposes of this dictation, there are 5 lumbar type vertebral bodies with the caudal most designated L5. Alignment:  There is mild anterolisthesis at L3-L4. Vertebrae: Vertebral body heights are maintained apart from degenerative endplate irregularity and Schmorl's nodes, greatest at L1-L2 and L2-L3. There is degenerative endplate marrow edema at the levels as well. Degenerative marrow edema is also present at the right L4-L5 facet joint. Conus medullaris and cauda equina: Conus  extends to the L1 level. Conus and cauda equina appear normal. Paraspinal and other soft tissues: Unremarkable. Disc levels: L1-L2: Disc bulge and facet arthropathy with ligamentum flavum infolding. Mild canal stenosis. Mild to moderate right and mild left foraminal stenosis. L2-L3: Disc bulge and facet arthropathy (marked on the right) with ligamentum flavum infolding. Moderate canal stenosis with narrowing of the lateral recesses. Mild foraminal stenosis. L3-L4: Anterolisthesis with uncovering of disc bulge and marked facet arthropathy with ligamentum flavum infolding. Marked canal stenosis with narrowing of the lateral recesses. Moderate to marked right and mild left foraminal stenosis. L4-L5: Disc bulge with endplate osteophytic  ridging. Moderate to marked facet arthropathy with joint effusion on the right and ligamentum flavum infolding. Moderate canal stenosis with narrowing of the lateral recesses. Moderate to marked foraminal stenosis. L5-S1: Small right far lateral disc protrusion. Moderate facet arthropathy. No canal stenosis. Mild right foraminal stenosis. No left foraminal stenosis. IMPRESSION: Multilevel degenerative changes as detailed above. Canal and lateral recess stenosis are greatest at L3-L4. Right foraminal stenosis is greatest at L3-L4 and L4-L5. Electronically Signed   By: Macy Mis M.D.   On: 05/05/2019 11:44   Ct Maxillofacial Wo Contrast  Result Date: 04/12/2019 CLINICAL DATA:  Facial trauma. EXAM: CT HEAD WITHOUT CONTRAST CT MAXILLOFACIAL WITHOUT CONTRAST TECHNIQUE: Multidetector CT imaging of the head and maxillofacial structures were performed using the standard protocol without intravenous contrast. Multiplanar CT image reconstructions of the maxillofacial structures were also generated. COMPARISON:  February 26, 2019 head CT. FINDINGS: CT HEAD FINDINGS Brain: No subdural, epidural, or subarachnoid hemorrhage. Cerebellum, brainstem, and basal cisterns are normal. Ventricles are normal. No mass effect or midline shift. No acute cortical ischemia or infarct identified. Foci of high attenuation left basal ganglia are stable, consistent with calcifications. Vascular: No hyperdense vessel or unexpected calcification. Skull: Normal. Negative for fracture or focal lesion. Other: Soft tissue swelling is seen in the left periorbital region, extending into the forehead with a hematoma. Left globe is intact. No other soft tissue abnormalities are noted. CT MAXILLOFACIAL FINDINGS Osseous: No fracture or mandibular dislocation. No destructive process. Orbits: The orbits are normal in appearance.  The globes are intact. Sinuses: Chronic sinus disease is seen in the right maxillary sinus. No acute sinus disease noted.  No sinus wall fractures are identified. Mastoid air cells and middle ears are well aerated. Soft tissues: There is soft tissue swelling in the left periorbital region, extending into the forehead with a hematoma. Other soft tissues are unremarkable. IMPRESSION: 1. Soft tissue swelling in the left periorbital region. Hematoma over the left forehead. 2. The left globe is intact. 3. No facial bone fractures or calvarial fractures noted. 4. No acute intracranial abnormalities are noted. Electronically Signed   By: Dorise Bullion III M.D   On: 04/12/2019 13:18    Irwin Brakeman, MD  [facility] How to contact the [facility] Attending or Consulting provider [place] or covering provider during after hours [place], for this patient?  1. Check the care team in [facility] and look for a) attending/consulting [facility] provider listed and b) the [facility] team listed 2. Log into [email or web address] and use [place]'s universal password to access. If you do not have the password, please contact the hospital operator. 3. Locate the [facility] provider you are looking for under [facility] and page to a number that you can be directly reached. 4. If you still have difficulty reaching the provider, please page the [facility] (Director on Call) for the Hospitalists  listed on amion for assistance.   If 7PM-7AM, please contact night-coverage [email or web address] Password [email or web address] 05/09/2019, 12:19 PM   LOS: 4 days

## 2019-05-09 NOTE — TOC Progression Note (Signed)
Transition of Care [facility]) - Progression Note    Patient Details  Name: Cassandra Adams MRN: [id number] Date of Birth: 07-26-1945  Transition of Care [facility]) CM/SW Contact  Shade Flood, LCSW Phone Number: 05/09/2019, 10:01 AM  Clinical Narrative:     TOC following. Pt status discussed in Progression today. Dr. Wynetta Emery indicated that Dr. Merlene Laughter wanting pt to go to SNF but pt not wanting that. PT has recommended HH PT and pt refused further work with PT yesterday. Unlikely pt's insurance would approve SNF rehab with this documentation.  Plan is for return to home with [facility] for PT at home. DC timeframe not yet known.  TOC will follow.  Expected Discharge Plan: [place] Barriers to Discharge: Continued Medical Work up  Expected Discharge Plan and Services Expected Discharge Plan: [place]   Discharge Planning Services: CM Consult Post Acute Care Choice: [place] arrangements for the past 2 months: Single Family Home                           HH Arranged: PT, Speech Therapy [place]: [place] ([place]) Date [place]: 05/07/19 Time [place]: [id number] Representative spoke with at [place]: [place] ([place]) Interventions    Readmission Risk Interventions Readmission Risk Prevention Plan 05/09/2019  Transportation Screening Complete  Home Care Screening Complete  Medication Review (RN CM) Complete  Some recent data might be hidden

## 2019-05-10 DIAGNOSIS — R531 Weakness: Secondary | ICD-10-CM

## 2019-05-10 DIAGNOSIS — G8191 Hemiplegia, unspecified affecting right dominant side: Secondary | ICD-10-CM

## 2019-05-10 LAB — GLUCOSE, CAPILLARY
Glucose-Capillary: 98 mg/dL (ref 70–99)
Glucose-Capillary: 134 mg/dL — ABNORMAL HIGH (ref 70–99)
Glucose-Capillary: 197 mg/dL — ABNORMAL HIGH (ref 70–99)
Glucose-Capillary: 127 mg/dL — ABNORMAL HIGH (ref 70–99)
Glucose-Capillary: 52 mg/dL — ABNORMAL LOW (ref 70–99)
Glucose-Capillary: 92 mg/dL (ref 70–99)

## 2019-05-10 LAB — BASIC METABOLIC PANEL
Anion gap: 9 (ref 5–15)
BUN: 41 mg/dL — ABNORMAL HIGH (ref 8–23)
CO2: 22 mmol/L (ref 22–32)
Calcium: 9.7 mg/dL (ref 8.9–10.3)
Chloride: 109 mmol/L (ref 98–111)
Creatinine, Ser: 1.33 mg/dL — ABNORMAL HIGH (ref 0.44–1.00)
GFR calc Af Amer: 46 mL/min — ABNORMAL LOW (ref >60)
GFR calc non Af Amer: 39 mL/min — ABNORMAL LOW (ref >60)
Glucose, Bld: 119 mg/dL — ABNORMAL HIGH (ref 70–99)
Potassium: 4.4 mmol/L (ref 3.5–5.1)
Sodium: 140 mmol/L (ref 135–145)

## 2019-05-10 MED ORDER — LABETALOL HCL 5 MG/ML IV SOLN: 10.0000 mg | INTRAVENOUS | Status: DC | PRN

## 2019-05-10 NOTE — Progress Notes (Signed)
Hypoglycemic Event  CBG: 52  Treatment: 8 oz juice/soda  Symptoms: Clammy  Follow-up CBG: Time:  2203 CBG Result: 92  Possible Reasons for Event: Pt stated she did not eat a lot of food today.  Comments/MD notified: MD notified: X. Blount. Pt is doing well, and I will continue to monitor pts CBG per orders.     Rosalie Doctor, RN

## 2019-05-10 NOTE — Progress Notes (Signed)
Pt is awake and oriented, eating breakfast. She tolerated her assessment and medications. Will continue to monitor pt.

## 2019-05-10 NOTE — Progress Notes (Signed)
[facility] PROGRESS NOTE  Cassandra Adams JFH:[id number] DOB: [birth year] DOA: 05/04/2019 PCP: Redmond School, MD  Brief History:  73 year old female with a history of CKD stage III, diabetes mellitus type 2, hypertension, TIA, hyperlipidemia, coronary disease, paroxysmal atrial fibrillation, and TIA presenting with right leg weakness which she first noticed on 05/04/2019 around 4:30 AM.  The patient was walking back to her bedroom at that time.  She went back to sleep.  She got up around noontime on 05/04/2019 and still felt her right leg to be weak.  The patient also felt like she had some right upper extremity weakness with numbness and tingling in her right hand.  She denied any visual disturbance, headache, new focal extremity weakness.  She denies any dysarthria or word finding difficulty.  As result, the patient presented for further evaluation.  Notably, the patient recently had a hospitalization from 02/26/2019 through 02/27/2019 during which time she had left upper extremity weakness and difficulty with phonation.  MRI of the brain at that time was negative.  She subsequently followed up with ENT, Dr. Benjamine Mola.  Apparently, her vocal cords were felt to be normal, and the patient was referred to [facility].  The patient states that her phonation has not really changed.  She denies any headache, visual disturbance, fever, chills, chest pain, shortness breath, nausea, vomiting, diarrhea.  She denies any back pain, recent falls or injuries, or right leg pain. In the emergency department, the patient had low-grade temperature 99.4 F.  She was hemodynamically stable saturating 97% room air.  BMP, LFTs, and CBC were essentially unremarkable.  CT of the brain was negative for acute intracranial abnormalities.   Assessment/Plan: Right lower extremity weakness/sensory disturbance highly suggestive of myelopathy per neurology -MRI her lumbar spine--Canal and lateral recess stenosis are greatest at  L3-L4. Right foraminal stenosis is greatest at L3-L4 and L4-L5. -MRI T-spine-->Severe left foraminal stenosis at T10-11 which could affect the left T10 nerve. Ankylosis of the T10 and T11 vertebral bodies. -MRI-C-spine--Multilevel degenerative changes as detailed above, greatest at C5-C6 and C6-C7. -MRI-Brain--no acute infarct -Clinical exam reveals some functional components to the patient's deficit--supported by PT eval -Serum L37--342 -Folic AJGO--1.1 -XBW--620 -Urinalysis 21-50 WBC -Urine drug screen -TSH 0.276 -Free T4--0.85 -Discontinued Zanaflex -CRP<0.8 -ESR 30 -PT eval-->HHPT -05/06/19--case discussed with neurosurgery (Nundkumar)--continued nonoperative management--MRI findings do not support clinical presentation;  Follow up in office -appreciate neurology consult>>continue IV steroids through today to complete course per neurology -pt is clinically improving and feeling better  Hypophonia -05/06/19--back to normal -discussed with speech therapy-->likely functional  Uncontrolled diabetes mellitus type 2, with hyperglycemia - steroid induced -03/04/2019 hemoglobin A1c 9.8 -increased Lantus to 55 units -increased novolog 16 units with meals -NovoLog sliding scale -Holding glipizide while inpatient  CBG (last 3)  Recent Labs    05/10/19 0553 05/10/19 0741 05/10/19 1100  GLUCAP 98 134* 197*   Paroxysmal atrial fibrillation -Continue apixaban -Continue carvedilol -Rate controlled  CKD stage IIIa -Baseline creatinine 1.0-1.3  Hyperlipidemia -Continue statin and WelChol  Essential hypertension -Continue carvedilol-->increase to 6.25 mg bid -restart hydralazine   Disposition Plan:   Home with HHPT possibly tomorrow Family Communication:   daughter by telephone   Consultants:  neuro  Code Status:  FULL  DVT Prophylaxis:  apixaban   Procedures: As Listed in Progress Note Above  Antibiotics: None  Subjective: Pt reports that she is  having a headache this morning.   Objective: Vitals:   05/09/19 1447 05/09/19 1558 05/09/19  2059 05/10/19 0500  BP: (!) 141/58 (!) 151/74 132/62 (!) 165/73  Pulse: 71 (!) 57 (!) 56 62  Resp: 18 20 18 14   Temp:  98.3 F (36.8 C) 98.5 F (36.9 C) 97.8 F (36.6 C)  TempSrc:  Oral Oral Oral  SpO2: 99% 100% 100% 100%  Weight:    92.4 kg  Height:        Intake/Output Summary (Last 24 hours) at 05/10/2019 1213 Last data filed at 05/10/2019 1030 Gross per 24 hour  Intake 796.51 ml  Output 450 ml  Net 346.51 ml   Weight change:  Exam:   General:  Pt is alert, follows commands appropriately, not in acute distress  HEENT: No icterus, No thrush, No neck mass, [place]/AT  Cardiovascular: RRR, S1/S2, no rubs, no gallops  Respiratory: CTA bilaterally, no wheezing, no crackles, no rhonchi  Abdomen: Soft/+BS, non tender, non distended, no guarding  Extremities: No edema, No lymphangitis, No petechiae, No rashes, no synovitis  Neurological: no changes from prior.  Data Reviewed: I have personally reviewed following labs and imaging studies Basic Metabolic Panel: Recent Labs  Lab 05/04/19 1709 05/05/19 0448 05/07/19 0554 05/08/19 0917 05/09/19 0509 05/10/19 0705  NA 139  --  136 136 142 140  K 4.4  --  4.6 5.5* 4.7 4.4  CL 109  --  105 104 110 109  CO2 25  --  22 22 26 22   GLUCOSE 119*  --  394* 319* 102* 119*  BUN 22  --  36* 39* 37* 41*  CREATININE 1.13*  --  1.34* 1.32* 1.41* 1.33*  CALCIUM 9.9  --  9.9 10.3 9.5 9.7  MG  --  2.3  --   --   --   --    Liver Function Tests: Recent Labs  Lab 05/04/19 1709  AST 33  ALT 22  ALKPHOS 85  BILITOT 0.5  PROT 7.4  ALBUMIN 3.7   No results for input(s): LIPASE, AMYLASE in the last 168 hours. No results for input(s): AMMONIA in the last 168 hours. Coagulation Profile: Recent Labs  Lab 05/04/19 1709  INR 1.2   CBC: Recent Labs  Lab 05/04/19 1709  WBC 5.7  NEUTROABS 4.1  HGB 13.5  HCT 40.2  MCV 83.2  PLT 191    Cardiac Enzymes: Recent Labs  Lab 05/05/19 0448 05/06/19 2244  CKTOTAL 147 108  CKMB  --  3.9   BNP: Invalid input(s): POCBNP CBG: Recent Labs  Lab 05/09/19 1605 05/09/19 2108 05/10/19 0553 05/10/19 0741 05/10/19 1100  GLUCAP 262* 110* 98 134* 197*   HbA1C: No results for input(s): HGBA1C in the last 72 hours. Urine analysis:    Component Value Date/Time   COLORURINE YELLOW 05/05/2019 0821   APPEARANCEUR CLEAR 05/05/2019 0821   LABSPEC 1.019 05/05/2019 0821   PHURINE 5.0 05/05/2019 0821   GLUCOSEU 150 (A) 05/05/2019 0821   HGBUR NEGATIVE 05/05/2019 0821   BILIRUBINUR NEGATIVE 05/05/2019 0821   KETONESUR NEGATIVE 05/05/2019 0821   PROTEINUR 30 (A) 05/05/2019 0821   UROBILINOGEN 0.2 09/15/2012 1010   NITRITE NEGATIVE 05/05/2019 0821   LEUKOCYTESUR LARGE (A) 05/05/2019 0821    Recent Results (from the past 240 hour(s))  SARS CORONAVIRUS 2 (TAT 6-24 HRS) Nasopharyngeal Nasopharyngeal Swab     Status: None   Collection Time: 05/04/19  7:20 PM   Specimen: Nasopharyngeal Swab  Result Value Ref Range Status   SARS Coronavirus 2 NEGATIVE NEGATIVE Final    Comment: (NOTE) SARS-CoV-2 target  nucleic acids are NOT DETECTED. The SARS-CoV-2 RNA is generally detectable in upper and lower respiratory specimens during the acute phase of infection. Negative results do not preclude SARS-CoV-2 infection, do not rule out co-infections with other pathogens, and should not be used as the sole basis for treatment or other patient management decisions. Negative results must be combined with clinical observations, patient history, and epidemiological information. The expected result is Negative. Fact Sheet for Patients: SugarRoll.be Fact Sheet for Healthcare Providers: https://www.woods-mathews.com/ This test is not yet approved or cleared by the [place] FDA and  has been authorized for detection and/or diagnosis of SARS-CoV-2 by FDA  under an Emergency Use Authorization (EUA). This EUA will remain  in effect (meaning this test can be used) for the duration of the COVID-19 declaration under Section 56 4(b)(1) of the Act, 21 U.S.C. section 360bbb-3(b)(1), unless the authorization is terminated or revoked sooner. Performed at [facility], [place] [address], [place], [place] [address]   Culture, Urine     Status: Abnormal   Collection Time: 05/05/19  8:21 AM   Specimen: Urine, Clean Catch  Result Value Ref Range Status   Specimen Description   Final    URINE, CLEAN CATCH Performed at [facility], [address], [place], [place] [address]    Special Requests   Final    NONE Performed at [facility], [address] [place], [place] [address]    Culture MULTIPLE SPECIES PRESENT, SUGGEST RECOLLECTION (A)  Final   Report Status 05/07/2019 FINAL  Final     Scheduled Meds:  amLODipine  10 mg Oral Daily   apixaban  5 mg Oral BID   aspirin EC  81 mg Oral Daily   atorvastatin  80 mg Oral QPM   carvedilol  6.25 mg Oral BID WC   colesevelam  1,875 mg Oral BID WC   hydrALAZINE  50 mg Oral Q8H   insulin aspart  0-20 Units Subcutaneous TID WC   insulin aspart  0-5 Units Subcutaneous QHS   insulin aspart  20 Units Subcutaneous TID WC   insulin glargine  30 Units Subcutaneous QHS   pantoprazole  40 mg Oral BID   tiZANidine  4 mg Oral QHS   Continuous Infusions:  sodium chloride 75 mL/hr at 05/09/19 1437   methylPREDNISolone (SOLU-MEDROL) injection 500 mg (05/10/19 0843)    Procedures/Studies: Dg Chest 2 View  Result Date: 04/12/2019 CLINICAL DATA:  Acute chest pain following fall.  Initial encounter. EXAM: CHEST - 2 VIEW COMPARISON:  None. FINDINGS: The cardiomediastinal silhouette is unremarkable. Elevated RIGHT hemidiaphragm again noted. There is no evidence of focal airspace disease, pulmonary edema, suspicious pulmonary nodule/mass, pleural effusion, or pneumothorax. No acute bony  abnormalities are identified. IMPRESSION: No active cardiopulmonary disease. Electronically Signed   By: Margarette Canada M.D.   On: 04/12/2019 14:32   Dg Pelvis 1-2 Views  Result Date: 04/12/2019 CLINICAL DATA:  Acute pelvic pain following fall. Initial encounter. 01/14/2019 and prior radiographs EXAM: PELVIS - 1-2 VIEW COMPARISON:  None. FINDINGS: No acute fracture, subluxation or dislocation. Mild degenerative changes in both hips noted. A calcified fibroid is again identified. Degenerative changes in the LOWER lumbar spine again noted. IMPRESSION: No acute abnormality. Electronically Signed   By: Margarette Canada M.D.   On: 04/12/2019 14:34   Ct Head Wo Contrast  Result Date: 05/04/2019 CLINICAL DATA:  Possible stroke, weakness, right-sided weakness and numbness EXAM: CT HEAD WITHOUT CONTRAST TECHNIQUE: Contiguous axial images were obtained from  the base of the skull through the vertex without intravenous contrast. COMPARISON:  04/12/2019 FINDINGS: Brain: No evidence of acute infarction, hemorrhage, hydrocephalus, extra-axial collection or mass lesion/mass effect. Vascular: No hyperdense vessel or unexpected calcification. Skull: Normal. Negative for fracture or focal lesion. Sinuses/Orbits: No acute finding. Other: There is a soft tissue contusion of the left forehead, which is improved compared to examination dated 04/12/2019. IMPRESSION: 1.  No acute intracranial pathology. 2. There is a soft tissue contusion of the left forehead, which is improved compared to examination dated 04/12/2019. Electronically Signed   By: Eddie Candle M.D.   On: 05/04/2019 16:35   Ct Head Wo Contrast  Result Date: 04/12/2019 CLINICAL DATA:  Facial trauma. EXAM: CT HEAD WITHOUT CONTRAST CT MAXILLOFACIAL WITHOUT CONTRAST TECHNIQUE: Multidetector CT imaging of the head and maxillofacial structures were performed using the standard protocol without intravenous contrast. Multiplanar CT image reconstructions of the maxillofacial  structures were also generated. COMPARISON:  February 26, 2019 head CT. FINDINGS: CT HEAD FINDINGS Brain: No subdural, epidural, or subarachnoid hemorrhage. Cerebellum, brainstem, and basal cisterns are normal. Ventricles are normal. No mass effect or midline shift. No acute cortical ischemia or infarct identified. Foci of high attenuation left basal ganglia are stable, consistent with calcifications. Vascular: No hyperdense vessel or unexpected calcification. Skull: Normal. Negative for fracture or focal lesion. Other: Soft tissue swelling is seen in the left periorbital region, extending into the forehead with a hematoma. Left globe is intact. No other soft tissue abnormalities are noted. CT MAXILLOFACIAL FINDINGS Osseous: No fracture or mandibular dislocation. No destructive process. Orbits: The orbits are normal in appearance.  The globes are intact. Sinuses: Chronic sinus disease is seen in the right maxillary sinus. No acute sinus disease noted. No sinus wall fractures are identified. Mastoid air cells and middle ears are well aerated. Soft tissues: There is soft tissue swelling in the left periorbital region, extending into the forehead with a hematoma. Other soft tissues are unremarkable. IMPRESSION: 1. Soft tissue swelling in the left periorbital region. Hematoma over the left forehead. 2. The left globe is intact. 3. No facial bone fractures or calvarial fractures noted. 4. No acute intracranial abnormalities are noted. Electronically Signed   By: Dorise Bullion III M.D   On: 04/12/2019 13:18   Mr Brain W Wo Contrast  Result Date: 05/05/2019 CLINICAL DATA:  Right-sided weakness EXAM: MRI HEAD WITHOUT AND WITH CONTRAST TECHNIQUE: Multiplanar, multiecho pulse sequences of the brain and surrounding structures were obtained without and with intravenous contrast. CONTRAST:  12m GADAVIST GADOBUTROL 1 MMOL/ML IV SOLN COMPARISON:  February 27, 2019 FINDINGS: Brain: There is no acute infarction or intracranial  hemorrhage. There is no intracranial mass, mass effect, edema, hydrocephalus, or extra-axial fluid collection. Patchy T2 hyperintensity in the supratentorial white matter is nonspecific but may reflect stable mild chronic microvascular ischemic changes. Ventricles and sulci are normal in size. Vascular: Major vessel flow voids at the skull base are preserved. Skull and upper cervical spine: Marrow signal is within normal limits. Cervical spine is better evaluated on concurrent dedicated imaging. Sinuses/Orbits: Mild paranasal sinus mucosal thickening. Bilateral lens replacements. Other: Left frontal scalp hematoma as seen on prior CT imaging. Trace right mastoid fluid opacification. IMPRESSION: No evidence of acute infarction, intracranial hemorrhage, or mass. Stable mild chronic microvascular ischemic changes. Electronically Signed   By: PMacy MisM.D.   On: 05/05/2019 11:13   Mr Thoracic Spine Wo Contrast  Result Date: 05/06/2019 CLINICAL DATA:  Progressive ataxia. Upper back pain  for 2 months. EXAM: MRI THORACIC SPINE WITHOUT CONTRAST TECHNIQUE: Multiplanar, multisequence MR imaging of the thoracic spine was performed. No intravenous contrast was administered. COMPARISON:  Chest x-ray dated 04/12/2019 FINDINGS: Alignment: 2 mm retrolisthesis of T11 on T12. Alignment is otherwise normal. Vertebrae: Ankylosis of the T10 and T11 vertebral bodies. No fracture, evidence of discitis, or bone lesion. Cord:  Normal signal and morphology.  Tip of the conus is at L1. Paraspinal and other soft tissues: Negative. Disc levels: T1-2 through T6-7: No significant disc bulging or disc protrusion. No spinal or foraminal stenosis. T7-8: Tiny central disc bulge with no neural impingement. Otherwise negative. T8-9 and T9-10: Normal. T10-11: Marked disc space narrowing. Small broad-based endplate osteophytes symmetrically indent the ventral aspect of the thecal sac without focal neural impingement. There is severe left  foraminal stenosis which could affect the left T10 nerve. There is ankylosis of the T10 and T11 vertebral bodies. T11-12: Marked disc space narrowing. Slight retrolisthesis. Small broad-based endplate osteophytes do not create significant spinal stenosis. No significant foraminal stenosis. T12-L1: Negative. IMPRESSION: 1. Severe left foraminal stenosis at T10-11 which could affect the left T10 nerve. 2. Ankylosis of the T10 and T11 vertebral bodies. 3. No other significant abnormalities. Specifically, no evidence to explain the patient's progressive ataxia. Electronically Signed   By: Lorriane Shire M.D.   On: 05/06/2019 09:57   Mr Cervical Spine W Wo Contrast  Result Date: 05/05/2019 CLINICAL DATA:  Radiculopathy, right-sided weakness EXAM: MRI CERVICAL SPINE WITHOUT AND WITH CONTRAST TECHNIQUE: Multiplanar and multiecho pulse sequences of the cervical spine, to include the craniocervical junction and cervicothoracic junction, were obtained without and with intravenous contrast. CONTRAST:  8m GADAVIST GADOBUTROL 1 MMOL/ML IV SOLN COMPARISON:  None. FINDINGS: Alignment: Straightening of the cervical lordosis. There is focal kyphosis at C5-C6. Vertebrae: Vertebral body heights are maintained apart from anterior wedging and endplate irregularity at C5 and C6. There is no significant marrow edema. No suspicious osseous lesion. Cord: No abnormal cord signal. Posterior Fossa, vertebral arteries, paraspinal tissues: Unremarkable. Disc levels: C2-C3:  Disc bulge.  No significant canal or foraminal stenosis. C3-C4: Small central disc protrusion. Mild canal stenosis. No foraminal stenosis. C4-C5: Very small central disc protrusion. Mild facet and uncovertebral hypertrophy. No canal or foraminal stenosis. C5-C6: Right central disc extrusion extending below the disc level, endplate osteophytes, and facet and uncovertebral hypertrophy. Mild canal stenosis with flattening of the right ventral aspect of the cord. No  foraminal stenosis. C6-C7: Disc bulge, endplate osteophytes, and facet and uncovertebral hypertrophy. Moderate canal stenosis. Marked foraminal stenosis, right greater than left. C7-T1:  Facet hypertrophy.  No canal or foraminal stenosis. IMPRESSION: Multilevel degenerative changes as detailed above, greatest at C5-C6 and C6-C7. Electronically Signed   By: PMacy MisM.D.   On: 05/05/2019 11:31   Mr Lumbar Spine W Wo Contrast  Result Date: 05/05/2019 CLINICAL DATA:  Radiculopathy, right-sided weakness EXAM: MRI LUMBAR SPINE WITHOUT AND WITH CONTRAST TECHNIQUE: Multiplanar and multiecho pulse sequences of the lumbar spine were obtained without and with intravenous contrast. CONTRAST:  735mGADAVIST GADOBUTROL 1 MMOL/ML IV SOLN COMPARISON:  None. FINDINGS: Motion artifact is present. Segmentation: For the purposes of this dictation, there are 5 lumbar type vertebral bodies with the caudal most designated L5. Alignment:  There is mild anterolisthesis at L3-L4. Vertebrae: Vertebral body heights are maintained apart from degenerative endplate irregularity and Schmorl's nodes, greatest at L1-L2 and L2-L3. There is degenerative endplate marrow edema at the levels as well. Degenerative marrow edema  is also present at the right L4-L5 facet joint. Conus medullaris and cauda equina: Conus extends to the L1 level. Conus and cauda equina appear normal. Paraspinal and other soft tissues: Unremarkable. Disc levels: L1-L2: Disc bulge and facet arthropathy with ligamentum flavum infolding. Mild canal stenosis. Mild to moderate right and mild left foraminal stenosis. L2-L3: Disc bulge and facet arthropathy (marked on the right) with ligamentum flavum infolding. Moderate canal stenosis with narrowing of the lateral recesses. Mild foraminal stenosis. L3-L4: Anterolisthesis with uncovering of disc bulge and marked facet arthropathy with ligamentum flavum infolding. Marked canal stenosis with narrowing of the lateral recesses.  Moderate to marked right and mild left foraminal stenosis. L4-L5: Disc bulge with endplate osteophytic ridging. Moderate to marked facet arthropathy with joint effusion on the right and ligamentum flavum infolding. Moderate canal stenosis with narrowing of the lateral recesses. Moderate to marked foraminal stenosis. L5-S1: Small right far lateral disc protrusion. Moderate facet arthropathy. No canal stenosis. Mild right foraminal stenosis. No left foraminal stenosis. IMPRESSION: Multilevel degenerative changes as detailed above. Canal and lateral recess stenosis are greatest at L3-L4. Right foraminal stenosis is greatest at L3-L4 and L4-L5. Electronically Signed   By: Macy Mis M.D.   On: 05/05/2019 11:44   Ct Maxillofacial Wo Contrast  Result Date: 04/12/2019 CLINICAL DATA:  Facial trauma. EXAM: CT HEAD WITHOUT CONTRAST CT MAXILLOFACIAL WITHOUT CONTRAST TECHNIQUE: Multidetector CT imaging of the head and maxillofacial structures were performed using the standard protocol without intravenous contrast. Multiplanar CT image reconstructions of the maxillofacial structures were also generated. COMPARISON:  February 26, 2019 head CT. FINDINGS: CT HEAD FINDINGS Brain: No subdural, epidural, or subarachnoid hemorrhage. Cerebellum, brainstem, and basal cisterns are normal. Ventricles are normal. No mass effect or midline shift. No acute cortical ischemia or infarct identified. Foci of high attenuation left basal ganglia are stable, consistent with calcifications. Vascular: No hyperdense vessel or unexpected calcification. Skull: Normal. Negative for fracture or focal lesion. Other: Soft tissue swelling is seen in the left periorbital region, extending into the forehead with a hematoma. Left globe is intact. No other soft tissue abnormalities are noted. CT MAXILLOFACIAL FINDINGS Osseous: No fracture or mandibular dislocation. No destructive process. Orbits: The orbits are normal in appearance.  The globes are  intact. Sinuses: Chronic sinus disease is seen in the right maxillary sinus. No acute sinus disease noted. No sinus wall fractures are identified. Mastoid air cells and middle ears are well aerated. Soft tissues: There is soft tissue swelling in the left periorbital region, extending into the forehead with a hematoma. Other soft tissues are unremarkable. IMPRESSION: 1. Soft tissue swelling in the left periorbital region. Hematoma over the left forehead. 2. The left globe is intact. 3. No facial bone fractures or calvarial fractures noted. 4. No acute intracranial abnormalities are noted. Electronically Signed   By: Dorise Bullion III M.D   On: 04/12/2019 13:18    Irwin Brakeman, MD  [facility] How to contact the [facility] Attending or Consulting provider [place] or covering provider during after hours [place], for this patient?  1. Check the care team in [facility] and look for a) attending/consulting [facility] provider listed and b) the [facility] team listed 2. Log into [email or web address] and use [facility] universal password to access. If you do not have the password, please contact the hospital operator. 3. Locate the [facility] provider you are looking for under [facility] and page to a number that you can be directly reached. 4. If you still  have difficulty reaching the provider, please page the [facility] (Director on Call) for the Hospitalists listed on amion for assistance.   If 7PM-7AM, please contact night-coverage [email or web address] Password [email or web address] 05/10/2019, 12:13 PM   LOS: 5 days

## 2019-05-11 ENCOUNTER — Inpatient Hospital Stay (HOSPITAL_COMMUNITY): Payer: Medicare HMO

## 2019-05-11 DIAGNOSIS — R42 Dizziness and giddiness: Secondary | ICD-10-CM

## 2019-05-11 DIAGNOSIS — R519 Headache, unspecified: Secondary | ICD-10-CM | POA: Diagnosis present

## 2019-05-11 DIAGNOSIS — G8929 Other chronic pain: Secondary | ICD-10-CM | POA: Diagnosis present

## 2019-05-11 DIAGNOSIS — H8110 Benign paroxysmal vertigo, unspecified ear: Secondary | ICD-10-CM | POA: Diagnosis present

## 2019-05-11 LAB — GLUCOSE, CAPILLARY
Glucose-Capillary: 112 mg/dL — ABNORMAL HIGH (ref 70–99)
Glucose-Capillary: 168 mg/dL — ABNORMAL HIGH (ref 70–99)
Glucose-Capillary: 182 mg/dL — ABNORMAL HIGH (ref 70–99)
Glucose-Capillary: 220 mg/dL — ABNORMAL HIGH (ref 70–99)

## 2019-05-11 MED ORDER — INSULIN ASPART 100 UNIT/ML ~~LOC~~ SOLN
0.0000 [IU] | Freq: Three times a day (TID) | SUBCUTANEOUS | Status: DC
  Administered 2019-05-11: 3 [IU] via SUBCUTANEOUS
  Administered 2019-05-11: 18:00:00 5 [IU] via SUBCUTANEOUS
  Administered 2019-05-11: 3 [IU] via SUBCUTANEOUS

## 2019-05-11 MED ORDER — INSULIN ASPART 100 UNIT/ML ~~LOC~~ SOLN
6.0000 [IU] | Freq: Three times a day (TID) | SUBCUTANEOUS | Status: DC
  Administered 2019-05-11 – 2019-05-12 (×5): 6 [IU] via SUBCUTANEOUS

## 2019-05-11 MED ORDER — MECLIZINE HCL 12.5 MG PO TABS: 50.0000 mg | ORAL_TABLET | Freq: Three times a day (TID) | ORAL | Status: DC | PRN

## 2019-05-11 NOTE — Progress Notes (Signed)
[facility] PROGRESS NOTE  STEPHANY POORMAN XKP:[id number] DOB: January 08, 1946 DOA: 05/04/2019 PCP: Redmond School, MD  Brief History:  73 year old female with a history of CKD stage III, diabetes mellitus type 2, hypertension, TIA, hyperlipidemia, coronary disease, paroxysmal atrial fibrillation, and TIA presenting with right leg weakness which she first noticed on 05/04/2019 around 4:30 AM.  The patient was walking back to her bedroom at that time.  She went back to sleep.  She got up around noontime on 05/04/2019 and still felt her right leg to be weak.  The patient also felt like she had some right upper extremity weakness with numbness and tingling in her right hand.  She denied any visual disturbance, headache, new focal extremity weakness.  She denies any dysarthria or word finding difficulty.  As result, the patient presented for further evaluation.  Notably, the patient recently had a hospitalization from 02/26/2019 through 02/27/2019 during which time she had left upper extremity weakness and difficulty with phonation.  MRI of the brain at that time was negative.  She subsequently followed up with ENT, Dr. Benjamine Mola.  Apparently, her vocal cords were felt to be normal, and the patient was referred to [facility].  The patient states that her phonation has not really changed.  She denies any headache, visual disturbance, fever, chills, chest pain, shortness breath, nausea, vomiting, diarrhea.  She denies any back pain, recent falls or injuries, or right leg pain.  In the emergency department, the patient had low-grade temperature 99.4 F.  She was hemodynamically stable saturating 97% room air.  BMP, LFTs, and CBC were essentially unremarkable.  CT of the brain was negative for acute intracranial abnormalities.   Assessment/Plan: Right lower extremity weakness/sensory disturbance highly suggestive of myelopathy per neurology -MRI her lumbar spine--Canal and lateral recess stenosis are greatest at  L3-L4. Right foraminal stenosis is greatest at L3-L4 and L4-L5. -MRI T-spine-->Severe left foraminal stenosis at T10-11 which could affect the left T10 nerve. Ankylosis of the T10 and T11 vertebral bodies. -MRI-C-spine--Multilevel degenerative changes as detailed above, greatest at C5-C6 and C6-C7. -MRI-Brain--no acute infarct -Clinical exam reveals some functional components to the patient's deficit--supported by PT eval -Serum E67--544 -Folic BEEF--0.0 -FHQ--197 -Urinalysis 21-50 WBC -Urine drug screen -TSH 0.276 -Free T4--0.85 -Discontinued Zanaflex -CRP<0.8 -ESR 30 -PT eval-->HHPT  -05/06/19--case discussed with neurosurgery (Nundkumar)--continued nonoperative management--MRI findings do not support clinical presentation;  Follow up in office -appreciate neurology consult>>continue IV steroids through 11/14 to complete course per neurology -pt has been clinically improving and feeling better  Hypophonia -05/06/19--back to normal -discussed with speech therapy-->likely functional  Uncontrolled diabetes mellitus type 2, with hyperglycemia - steroid induced -03/04/2019 hemoglobin A1c 9.8 -reducing insulin doses now that patient has completed IV steroids -NovoLog sliding scale -Holding glipizide while inpatient  CBG (last 3)  Recent Labs    05/10/19 2205 05/11/19 0255 05/11/19 0749  GLUCAP 92 112* 168*   Paroxysmal atrial fibrillation -Continue apixaban -Continue carvedilol -Rate controlled  CKD stage IIIa -Baseline creatinine 1.0-1.3  Hyperlipidemia -Continue statin and WelChol  Essential hypertension -Continue carvedilol-->increase to 6.25 mg bid -restarted home hydralazine   Dizziness - Pt reports a history of vertigo.  Trial of meclizine 50 mg TID prn, PT eval for vestibular rehab.    Headache - suspect secondary to high dose steroids, because it is persisting will get CT brain STAT.    Disposition Plan:   Home with HHPT  Family Communication:    daughter by telephone   Consultants:  neuro  Code Status:  FULL  DVT Prophylaxis:  apixaban   Procedures: As Listed in Progress Note Above  Antibiotics: None  Subjective: Pt reports that she is having dizziness this morning and persistent headache  Objective: Vitals:   05/11/19 0300 05/11/19 0408 05/11/19 0609 05/11/19 0722  BP: 115/72 132/76 120/60   Pulse:  (!) 103 (!) 104   Resp:  (!) 22 18   Temp:  97.6 F (36.4 C)    TempSrc:  Oral    SpO2:  97% 96%   Weight:    93.1 kg  Height:        Intake/Output Summary (Last 24 hours) at 05/11/2019 1028 Last data filed at 05/11/2019 0724 Gross per 24 hour  Intake 4008.42 ml  Output 1450 ml  Net 2558.42 ml   Weight change:  Exam:   General:  Pt is alert, follows commands appropriately, not in acute distress  HEENT: No icterus, No thrush, No neck mass, [place]/AT  Cardiovascular: RRR, S1/S2, no rubs, no gallops  Respiratory: CTA bilaterally, no wheezing, no crackles, no rhonchi  Abdomen: Soft/+BS, non tender, non distended, no guarding  Extremities: No edema, No lymphangitis, No petechiae, No rashes, no synovitis  Neurological: no changes from prior.  Data Reviewed: I have personally reviewed following labs and imaging studies Basic Metabolic Panel: Recent Labs  Lab 05/04/19 1709 05/05/19 0448 05/07/19 0554 05/08/19 0917 05/09/19 0509 05/10/19 0705  NA 139  --  136 136 142 140  K 4.4  --  4.6 5.5* 4.7 4.4  CL 109  --  105 104 110 109  CO2 25  --  _0 GLUCOSE 119*  --  394* 319* 102* 119*  BUN 22  --  36* 39* 37* 41*  CREATININE 1.13*  --  1.34* 1.32* 1.41* 1.33*  CALCIUM 9.9  --  9.9 10.3 9.5 9.7  MG  --  2.3  --   --   --   --    Liver Function Tests: Recent Labs  Lab 05/04/19 1709  AST 33  ALT 22  ALKPHOS 85  BILITOT 0.5  PROT 7.4  ALBUMIN 3.7   No results for input(s): LIPASE, AMYLASE in the last 168 hours. No results for input(s): AMMONIA in the last 168  hours. Coagulation Profile: Recent Labs  Lab 05/04/19 1709  INR 1.2   CBC: Recent Labs  Lab 05/04/19 1709  WBC 5.7  NEUTROABS 4.1  HGB 13.5  HCT 40.2  MCV 83.2  PLT 191   Cardiac Enzymes: Recent Labs  Lab 05/05/19 0448 05/06/19 2244  CKTOTAL 147 108  CKMB  --  3.9   BNP: Invalid input(s): POCBNP CBG: Recent Labs  Lab 05/10/19 1601 05/10/19 2129 05/10/19 2205 05/11/19 0255 05/11/19 0749  GLUCAP 127* 52* 92 112* 168*   HbA1C: No results for input(s): HGBA1C in the last 72 hours. Urine analysis:    Component Value Date/Time   COLORURINE YELLOW 05/05/2019 0821   APPEARANCEUR CLEAR 05/05/2019 0821   LABSPEC 1.019 05/05/2019 0821   PHURINE 5.0 05/05/2019 0821   GLUCOSEU 150 (A) 05/05/2019 0821   HGBUR NEGATIVE 05/05/2019 0821   BILIRUBINUR NEGATIVE 05/05/2019 0821   KETONESUR NEGATIVE 05/05/2019 0821   PROTEINUR 30 (A) 05/05/2019 0821   UROBILINOGEN 0.2 09/15/2012 1010   NITRITE NEGATIVE 05/05/2019 0821   LEUKOCYTESUR LARGE (A) 05/05/2019 0821    Recent Results (from the past 240 hour(s))  SARS CORONAVIRUS 2 (TAT 6-24 HRS) Nasopharyngeal Nasopharyngeal Swab  Status: None   Collection Time: 05/04/19  7:20 PM   Specimen: Nasopharyngeal Swab  Result Value Ref Range Status   SARS Coronavirus 2 NEGATIVE NEGATIVE Final    Comment: (NOTE) SARS-CoV-2 target nucleic acids are NOT DETECTED. The SARS-CoV-2 RNA is generally detectable in upper and lower respiratory specimens during the acute phase of infection. Negative results do not preclude SARS-CoV-2 infection, do not rule out co-infections with other pathogens, and should not be used as the sole basis for treatment or other patient management decisions. Negative results must be combined with clinical observations, patient history, and epidemiological information. The expected result is Negative. Fact Sheet for Patients: SugarRoll.be Fact Sheet for Healthcare  Providers: https://www.woods-mathews.com/ This test is not yet approved or cleared by the [place] FDA and  has been authorized for detection and/or diagnosis of SARS-CoV-2 by FDA under an Emergency Use Authorization (EUA). This EUA will remain  in effect (meaning this test can be used) for the duration of the COVID-19 declaration under Section 56 4(b)(1) of the Act, 21 U.S.C. section 360bbb-3(b)(1), unless the authorization is terminated or revoked sooner. Performed at [facility], [place] [address] [place], [place] [address]   Culture, Urine     Status: Abnormal   Collection Time: 05/05/19  8:21 AM   Specimen: Urine, Clean Catch  Result Value Ref Range Status   Specimen Description   Final    URINE, CLEAN CATCH Performed at [facility], [address], [place], [place] [address]    Special Requests   Final    NONE Performed at [facility], [address], [place], [place] [address]    Culture MULTIPLE SPECIES PRESENT, SUGGEST RECOLLECTION (A)  Final   Report Status 05/07/2019 FINAL  Final     Scheduled Meds:  amLODipine  10 mg Oral Daily   apixaban  5 mg Oral BID   aspirin EC  81 mg Oral Daily   atorvastatin  80 mg Oral QPM   carvedilol  6.25 mg Oral BID WC   colesevelam  1,875 mg Oral BID WC   hydrALAZINE  50 mg Oral Q8H   insulin aspart  0-15 Units Subcutaneous TID WC   insulin aspart  6 Units Subcutaneous TID WC   insulin glargine  30 Units Subcutaneous QHS   pantoprazole  40 mg Oral BID   tiZANidine  4 mg Oral QHS   Continuous Infusions:  sodium chloride 50 mL/hr at 05/11/19 1028    Procedures/Studies: Dg Chest 2 View  Result Date: 04/12/2019 CLINICAL DATA:  Acute chest pain following fall.  Initial encounter. EXAM: CHEST - 2 VIEW COMPARISON:  None. FINDINGS: The cardiomediastinal silhouette is unremarkable. Elevated RIGHT hemidiaphragm again noted. There is no evidence of focal airspace disease, pulmonary edema, suspicious  pulmonary nodule/mass, pleural effusion, or pneumothorax. No acute bony abnormalities are identified. IMPRESSION: No active cardiopulmonary disease. Electronically Signed   By: Margarette Canada M.D.   On: 04/12/2019 14:32   Dg Pelvis 1-2 Views  Result Date: 04/12/2019 CLINICAL DATA:  Acute pelvic pain following fall. Initial encounter. 01/14/2019 and prior radiographs EXAM: PELVIS - 1-2 VIEW COMPARISON:  None. FINDINGS: No acute fracture, subluxation or dislocation. Mild degenerative changes in both hips noted. A calcified fibroid is again identified. Degenerative changes in the LOWER lumbar spine again noted. IMPRESSION: No acute abnormality. Electronically Signed   By: Margarette Canada M.D.   On: 04/12/2019 14:34   Ct Head Wo Contrast  Result Date: 05/04/2019 CLINICAL DATA:  Possible  stroke, weakness, right-sided weakness and numbness EXAM: CT HEAD WITHOUT CONTRAST TECHNIQUE: Contiguous axial images were obtained from the base of the skull through the vertex without intravenous contrast. COMPARISON:  04/12/2019 FINDINGS: Brain: No evidence of acute infarction, hemorrhage, hydrocephalus, extra-axial collection or mass lesion/mass effect. Vascular: No hyperdense vessel or unexpected calcification. Skull: Normal. Negative for fracture or focal lesion. Sinuses/Orbits: No acute finding. Other: There is a soft tissue contusion of the left forehead, which is improved compared to examination dated 04/12/2019. IMPRESSION: 1.  No acute intracranial pathology. 2. There is a soft tissue contusion of the left forehead, which is improved compared to examination dated 04/12/2019. Electronically Signed   By: Eddie Candle M.D.   On: 05/04/2019 16:35   Ct Head Wo Contrast  Result Date: 04/12/2019 CLINICAL DATA:  Facial trauma. EXAM: CT HEAD WITHOUT CONTRAST CT MAXILLOFACIAL WITHOUT CONTRAST TECHNIQUE: Multidetector CT imaging of the head and maxillofacial structures were performed using the standard protocol without intravenous  contrast. Multiplanar CT image reconstructions of the maxillofacial structures were also generated. COMPARISON:  February 26, 2019 head CT. FINDINGS: CT HEAD FINDINGS Brain: No subdural, epidural, or subarachnoid hemorrhage. Cerebellum, brainstem, and basal cisterns are normal. Ventricles are normal. No mass effect or midline shift. No acute cortical ischemia or infarct identified. Foci of high attenuation left basal ganglia are stable, consistent with calcifications. Vascular: No hyperdense vessel or unexpected calcification. Skull: Normal. Negative for fracture or focal lesion. Other: Soft tissue swelling is seen in the left periorbital region, extending into the forehead with a hematoma. Left globe is intact. No other soft tissue abnormalities are noted. CT MAXILLOFACIAL FINDINGS Osseous: No fracture or mandibular dislocation. No destructive process. Orbits: The orbits are normal in appearance.  The globes are intact. Sinuses: Chronic sinus disease is seen in the right maxillary sinus. No acute sinus disease noted. No sinus wall fractures are identified. Mastoid air cells and middle ears are well aerated. Soft tissues: There is soft tissue swelling in the left periorbital region, extending into the forehead with a hematoma. Other soft tissues are unremarkable. IMPRESSION: 1. Soft tissue swelling in the left periorbital region. Hematoma over the left forehead. 2. The left globe is intact. 3. No facial bone fractures or calvarial fractures noted. 4. No acute intracranial abnormalities are noted. Electronically Signed   By: Dorise Bullion III M.D   On: 04/12/2019 13:18   Mr Brain W Wo Contrast  Result Date: 05/05/2019 CLINICAL DATA:  Right-sided weakness EXAM: MRI HEAD WITHOUT AND WITH CONTRAST TECHNIQUE: Multiplanar, multiecho pulse sequences of the brain and surrounding structures were obtained without and with intravenous contrast. CONTRAST:  29m GADAVIST GADOBUTROL 1 MMOL/ML IV SOLN COMPARISON:  February 27, 2019 FINDINGS: Brain: There is no acute infarction or intracranial hemorrhage. There is no intracranial mass, mass effect, edema, hydrocephalus, or extra-axial fluid collection. Patchy T2 hyperintensity in the supratentorial white matter is nonspecific but may reflect stable mild chronic microvascular ischemic changes. Ventricles and sulci are normal in size. Vascular: Major vessel flow voids at the skull base are preserved. Skull and upper cervical spine: Marrow signal is within normal limits. Cervical spine is better evaluated on concurrent dedicated imaging. Sinuses/Orbits: Mild paranasal sinus mucosal thickening. Bilateral lens replacements. Other: Left frontal scalp hematoma as seen on prior CT imaging. Trace right mastoid fluid opacification. IMPRESSION: No evidence of acute infarction, intracranial hemorrhage, or mass. Stable mild chronic microvascular ischemic changes. Electronically Signed   By: PMacy MisM.D.   On: 05/05/2019 11:13  Mr Thoracic Spine Wo Contrast  Result Date: 05/06/2019 CLINICAL DATA:  Progressive ataxia. Upper back pain for 2 months. EXAM: MRI THORACIC SPINE WITHOUT CONTRAST TECHNIQUE: Multiplanar, multisequence MR imaging of the thoracic spine was performed. No intravenous contrast was administered. COMPARISON:  Chest x-ray dated 04/12/2019 FINDINGS: Alignment: 2 mm retrolisthesis of T11 on T12. Alignment is otherwise normal. Vertebrae: Ankylosis of the T10 and T11 vertebral bodies. No fracture, evidence of discitis, or bone lesion. Cord:  Normal signal and morphology.  Tip of the conus is at L1. Paraspinal and other soft tissues: Negative. Disc levels: T1-2 through T6-7: No significant disc bulging or disc protrusion. No spinal or foraminal stenosis. T7-8: Tiny central disc bulge with no neural impingement. Otherwise negative. T8-9 and T9-10: Normal. T10-11: Marked disc space narrowing. Small broad-based endplate osteophytes symmetrically indent the ventral aspect of the  thecal sac without focal neural impingement. There is severe left foraminal stenosis which could affect the left T10 nerve. There is ankylosis of the T10 and T11 vertebral bodies. T11-12: Marked disc space narrowing. Slight retrolisthesis. Small broad-based endplate osteophytes do not create significant spinal stenosis. No significant foraminal stenosis. T12-L1: Negative. IMPRESSION: 1. Severe left foraminal stenosis at T10-11 which could affect the left T10 nerve. 2. Ankylosis of the T10 and T11 vertebral bodies. 3. No other significant abnormalities. Specifically, no evidence to explain the patient's progressive ataxia. Electronically Signed   By: Lorriane Shire M.D.   On: 05/06/2019 09:57   Mr Cervical Spine W Wo Contrast  Result Date: 05/05/2019 CLINICAL DATA:  Radiculopathy, right-sided weakness EXAM: MRI CERVICAL SPINE WITHOUT AND WITH CONTRAST TECHNIQUE: Multiplanar and multiecho pulse sequences of the cervical spine, to include the craniocervical junction and cervicothoracic junction, were obtained without and with intravenous contrast. CONTRAST:  33m GADAVIST GADOBUTROL 1 MMOL/ML IV SOLN COMPARISON:  None. FINDINGS: Alignment: Straightening of the cervical lordosis. There is focal kyphosis at C5-C6. Vertebrae: Vertebral body heights are maintained apart from anterior wedging and endplate irregularity at C5 and C6. There is no significant marrow edema. No suspicious osseous lesion. Cord: No abnormal cord signal. Posterior Fossa, vertebral arteries, paraspinal tissues: Unremarkable. Disc levels: C2-C3:  Disc bulge.  No significant canal or foraminal stenosis. C3-C4: Small central disc protrusion. Mild canal stenosis. No foraminal stenosis. C4-C5: Very small central disc protrusion. Mild facet and uncovertebral hypertrophy. No canal or foraminal stenosis. C5-C6: Right central disc extrusion extending below the disc level, endplate osteophytes, and facet and uncovertebral hypertrophy. Mild canal stenosis  with flattening of the right ventral aspect of the cord. No foraminal stenosis. C6-C7: Disc bulge, endplate osteophytes, and facet and uncovertebral hypertrophy. Moderate canal stenosis. Marked foraminal stenosis, right greater than left. C7-T1:  Facet hypertrophy.  No canal or foraminal stenosis. IMPRESSION: Multilevel degenerative changes as detailed above, greatest at C5-C6 and C6-C7. Electronically Signed   By: PMacy MisM.D.   On: 05/05/2019 11:31   Mr Lumbar Spine W Wo Contrast  Result Date: 05/05/2019 CLINICAL DATA:  Radiculopathy, right-sided weakness EXAM: MRI LUMBAR SPINE WITHOUT AND WITH CONTRAST TECHNIQUE: Multiplanar and multiecho pulse sequences of the lumbar spine were obtained without and with intravenous contrast. CONTRAST:  736mGADAVIST GADOBUTROL 1 MMOL/ML IV SOLN COMPARISON:  None. FINDINGS: Motion artifact is present. Segmentation: For the purposes of this dictation, there are 5 lumbar type vertebral bodies with the caudal most designated L5. Alignment:  There is mild anterolisthesis at L3-L4. Vertebrae: Vertebral body heights are maintained apart from degenerative endplate irregularity and Schmorl's nodes, greatest at  L1-L2 and L2-L3. There is degenerative endplate marrow edema at the levels as well. Degenerative marrow edema is also present at the right L4-L5 facet joint. Conus medullaris and cauda equina: Conus extends to the L1 level. Conus and cauda equina appear normal. Paraspinal and other soft tissues: Unremarkable. Disc levels: L1-L2: Disc bulge and facet arthropathy with ligamentum flavum infolding. Mild canal stenosis. Mild to moderate right and mild left foraminal stenosis. L2-L3: Disc bulge and facet arthropathy (marked on the right) with ligamentum flavum infolding. Moderate canal stenosis with narrowing of the lateral recesses. Mild foraminal stenosis. L3-L4: Anterolisthesis with uncovering of disc bulge and marked facet arthropathy with ligamentum flavum infolding.  Marked canal stenosis with narrowing of the lateral recesses. Moderate to marked right and mild left foraminal stenosis. L4-L5: Disc bulge with endplate osteophytic ridging. Moderate to marked facet arthropathy with joint effusion on the right and ligamentum flavum infolding. Moderate canal stenosis with narrowing of the lateral recesses. Moderate to marked foraminal stenosis. L5-S1: Small right far lateral disc protrusion. Moderate facet arthropathy. No canal stenosis. Mild right foraminal stenosis. No left foraminal stenosis. IMPRESSION: Multilevel degenerative changes as detailed above. Canal and lateral recess stenosis are greatest at L3-L4. Right foraminal stenosis is greatest at L3-L4 and L4-L5. Electronically Signed   By: Macy Mis M.D.   On: 05/05/2019 11:44   Ct Maxillofacial Wo Contrast  Result Date: 04/12/2019 CLINICAL DATA:  Facial trauma. EXAM: CT HEAD WITHOUT CONTRAST CT MAXILLOFACIAL WITHOUT CONTRAST TECHNIQUE: Multidetector CT imaging of the head and maxillofacial structures were performed using the standard protocol without intravenous contrast. Multiplanar CT image reconstructions of the maxillofacial structures were also generated. COMPARISON:  February 26, 2019 head CT. FINDINGS: CT HEAD FINDINGS Brain: No subdural, epidural, or subarachnoid hemorrhage. Cerebellum, brainstem, and basal cisterns are normal. Ventricles are normal. No mass effect or midline shift. No acute cortical ischemia or infarct identified. Foci of high attenuation left basal ganglia are stable, consistent with calcifications. Vascular: No hyperdense vessel or unexpected calcification. Skull: Normal. Negative for fracture or focal lesion. Other: Soft tissue swelling is seen in the left periorbital region, extending into the forehead with a hematoma. Left globe is intact. No other soft tissue abnormalities are noted. CT MAXILLOFACIAL FINDINGS Osseous: No fracture or mandibular dislocation. No destructive process.  Orbits: The orbits are normal in appearance.  The globes are intact. Sinuses: Chronic sinus disease is seen in the right maxillary sinus. No acute sinus disease noted. No sinus wall fractures are identified. Mastoid air cells and middle ears are well aerated. Soft tissues: There is soft tissue swelling in the left periorbital region, extending into the forehead with a hematoma. Other soft tissues are unremarkable. IMPRESSION: 1. Soft tissue swelling in the left periorbital region. Hematoma over the left forehead. 2. The left globe is intact. 3. No facial bone fractures or calvarial fractures noted. 4. No acute intracranial abnormalities are noted. Electronically Signed   By: Dorise Bullion III M.D   On: 04/12/2019 13:18    Irwin Brakeman, MD  [facility] How to contact the [facility] Attending or Consulting provider [place] or covering provider during after hours [place], for this patient?  1. Check the care team in [facility] and look for a) attending/consulting [facility] provider listed and b) the [facility] team listed 2. Log into [email or web address] and use [facility] universal password to access. If you do not have the password, please contact the hospital operator. 3. Locate the [facility] provider you are looking for under  [facility] and page to a number that you can be directly reached. 4. If you still have difficulty reaching the provider, please page the [facility] (Director on Call) for the Hospitalists listed on amion for assistance.   If 7PM-7AM, please contact night-coverage [email or web address] Password [email or web address] 05/11/2019, 10:28 AM   LOS: 6 days

## 2019-05-12 DIAGNOSIS — R519 Headache, unspecified: Secondary | ICD-10-CM

## 2019-05-12 DIAGNOSIS — R42 Dizziness and giddiness: Secondary | ICD-10-CM

## 2019-05-12 DIAGNOSIS — G8929 Other chronic pain: Secondary | ICD-10-CM

## 2019-05-12 DIAGNOSIS — H8110 Benign paroxysmal vertigo, unspecified ear: Secondary | ICD-10-CM

## 2019-05-12 LAB — GLUCOSE, CAPILLARY
Glucose-Capillary: 120 mg/dL — ABNORMAL HIGH (ref 70–99)
Glucose-Capillary: 113 mg/dL — ABNORMAL HIGH (ref 70–99)
Glucose-Capillary: 75 mg/dL (ref 70–99)

## 2019-05-12 MED ORDER — PANTOPRAZOLE SODIUM 40 MG PO TBEC: 40.0000 mg | DELAYED_RELEASE_TABLET | Freq: Every day | ORAL | 0 refills | Status: DC

## 2019-05-12 MED ORDER — CARVEDILOL 6.25 MG PO TABS: 6.2500 mg | ORAL_TABLET | Freq: Two times a day (BID) | ORAL | 1 refills | Status: DC

## 2019-05-12 NOTE — Discharge Summary (Signed)
Physician Discharge Summary  Cassandra Adams UYQ:[id number] DOB: 16-Apr-1946 DOA: 05/04/2019  PCP: Redmond School, MD Neurologist: Dr. Merlene Laughter  Admit date: 05/04/2019 Discharge date: 05/12/2019  Admitted From:  Home  Disposition: Home with HHPT   Recommendations for Outpatient Follow-up:  1. Follow up with PCP in 1 weeks 2. Follow up with neurologist in 1-2 weeks   Home Health:  PT   Discharge Condition: STABLE   CODE STATUS: FULL    Brief Hospitalization Summary: Please see all hospital notes, images, labs for full details of the hospitalization. Brief History: 73 year old female with a history of CKD stage III, diabetes mellitus type 2, hypertension, TIA, hyperlipidemia, coronary disease, paroxysmal atrial fibrillation, and TIA presenting with right leg weakness which she first noticed on 05/04/2019 around 4:30 AM. The patient was walking back to her bedroom at that time. She went back to sleep. She got up around noontime on 05/04/2019 and still felt her right leg to be weak. The patient also felt like she had some right upper extremity weakness with numbness and tingling in her right hand. She denied any visual disturbance, headache, new focal extremity weakness. She denies any dysarthria or word finding difficulty. As result, the patient presented for further evaluation. Notably, the patient recently had a hospitalization from 02/26/2019 through 02/27/2019 during which time she had left upper extremity weakness and difficulty with phonation. MRI of the brain at that time was negative. She subsequently followed up with ENT, Dr. Benjamine Mola. Apparently, her vocal cords were felt to be normal, and the patient was referred to [facility].The patient states that her phonation has not really changed. She denies any headache, visual disturbance, fever, chills, chest pain, shortness breath, nausea, vomiting, diarrhea. She denies any back pain, recent falls or injuries, or right leg pain.  In the  emergency department, the patient had low-grade temperature 99.4 F. She was hemodynamically stable saturating 97% room air. BMP, LFTs, and CBC were essentially unremarkable. CT of the brain was negative for acute intracranial abnormalities.   Assessment/Plan: Right lower extremity weakness/sensory disturbance highly suggestive of myelopathy per neurology -MRI her lumbar spine--Canal and lateral recess stenosis are greatest at L3-L4. Right foraminal stenosis is greatest at L3-L4 and L4-L5. -MRI T-spine-->Severe left foraminal stenosis at T10-11 which could affect the left T10 nerve. Ankylosis of the T10 and T11 vertebral bodies. -MRI-C-spine--Multilevel degenerative changes as detailed above, greatest at C5-C6 and C6-C7. -MRI-Brain--no acute infarct -Clinical exam reveals some functional components to the patient's deficit--supported by PT eval -Serum G38--756 -Folic EPPI--9.5 -JOA--416 -Urinalysis 21-50 WBC -Urine drug screen -TSH 0.276 -Free T4--0.85 -Discontinued Zanaflex -CRP<0.8 -ESR 30 -PT eval-->HHPT  -05/06/19--case discussed with neurosurgery (Nundkumar)--continued nonoperative management--MRI findings do not support clinical presentation;  Follow up in office -appreciate neurology consult>>continue IV steroids through 11/14 to complete course per neurology -pt has been clinically improving and feeling better  Hypophonia -05/06/19--back to normal -discussed with speech therapy-->likely functional  Uncontrolled diabetes mellitus type 2, with hyperglycemia - steroid induced -03/04/2019 hemoglobin A1c 9.8 -reducing insulin doses now that patient has completed IV steroids -NovoLog sliding scale -Holding glipizide while inpatient  CBG (last 3)  Recent Labs    05/11/19 1612 05/12/19 0357 05/12/19 0744  GLUCAP 220* 120* 113*    Paroxysmal atrial fibrillation -Continue apixaban -Continue carvedilol -Rate controlled  CKD stage IIIa -Baseline creatinine  1.0-1.3  Hyperlipidemia -Continue statin and WelChol  Essential hypertension -Continue carvedilol-->increase to 6.25 mg bid -restarted home hydralazine  Dizziness - Pt reports a history of vertigo.  Pt says she is feeling better after meclizine.   CT head no acute findings. Pt says resolved now.    Headache - suspect secondary to high dose steroids,  CT head no acute findings. Pt says resolved now.  Disposition Plan: Home with [place] Communication:daughter by telephone   Consultants:neuro  Code Status: FULL  DVT Prophylaxis:apixaban   Procedures: As Listed in Progress Note Above  Antibiotics: None  Discharge Diagnoses:  Active Problems:   Essential hypertension, benign   GERD   Carotid stenosis, bilateral   PAF (paroxysmal atrial fibrillation) (HCC)   Dyslipidemia   Left leg weakness   Type 2 diabetes mellitus with stage 4 chronic kidney disease, with long-term current use of insulin (HCC)   Left-sided weakness   Right hemiparesis ([place])   Uncontrolled type 2 diabetes mellitus with hyperglycemia ([place])   CKD stage 3 due to type 2 diabetes mellitus (HCC)   Atrial fibrillation with RVR (HCC)   Benign paroxysmal positional vertigo   Chronic headaches   Dizziness and giddiness   Discharge Instructions:  Allergies as of 05/12/2019      Reactions   Penicillins Hives   Has patient had a PCN reaction causing immediate rash, facial/tongue/throat swelling, SOB or lightheadedness with hypotension: no Has patient had a PCN reaction causing severe rash involving mucus membranes or skin necrosis: No  Has patient had a PCN reaction that required hospitalization: no Has patient had a PCN reaction occurring within the last 10 years: no If all of the above answers are "NO", then may proceed with Cephalosporin use.      Medication List    TAKE these medications   aspirin EC 81 MG tablet Take 81 mg by mouth daily.   atorvastatin 80 MG  tablet Commonly known as: LIPITOR Take 1 tablet (80 mg total) by mouth every evening.   Basaglar KwikPen 100 UNIT/ML Sopn Inject 36 Units into the skin at bedtime.   carvedilol 6.25 MG tablet Commonly known as: COREG Take 1 tablet (6.25 mg total) by mouth 2 (two) times daily. What changed:   medication strength  how much to take   colesevelam 625 MG tablet Commonly known as: WELCHOL Take 1,875 mg by mouth 2 (two) times daily with a meal. *May take 6 tablets once a day with meals*   Eliquis 5 MG Tabs tablet Generic drug: apixaban Take 1 tablet (5 mg total) by mouth 2 (two) times daily.   furosemide 40 MG tablet Commonly known as: LASIX Take 40 mg by mouth 2 (two) times daily.   glipiZIDE 5 MG 24 hr tablet Commonly known as: GLUCOTROL XL Take 1 tablet (5 mg total) by mouth daily with breakfast.   hydrALAZINE 25 MG tablet Commonly known as: APRESOLINE Take 1 tablet (25 mg total) by mouth 3 (three) times daily.   insulin lispro 100 UNIT/ML injection Commonly known as: HUMALOG Inject 10 Units into the skin 3 (three) times daily before meals.   lisinopril 10 MG tablet Commonly known as: ZESTRIL Take 10 mg by mouth daily.   nitroGLYCERIN 0.4 MG SL tablet Commonly known as: NITROSTAT Place 1 tablet (0.4 mg total) under the tongue every 5 (five) minutes as needed for chest pain (up to 3 doses).   nystatin cream Commonly known as: MYCOSTATIN Apply 1 application topically 2 (two) times daily as needed for dry skin.   pantoprazole 40 MG tablet Commonly known as: PROTONIX Take 1 tablet (40 mg total) by mouth daily.   tiZANidine 4  MG tablet Commonly known as: ZANAFLEX Take 4 mg by mouth at bedtime.      Follow-up Information    [facility] Follow [facility].   Specialty: [place] Why: [place] staff will call you to schedule their visits to your home       Redmond School, MD. Schedule an appointment as soon as possible for a  visit in 3 day(s).   Specialty: Internal Medicine Contact information: [address] [place] [address] [phone]        Phillips Odor, MD. Schedule an appointment as soon as possible for a visit in 1 week(s).   Specialty: Neurology Why: Hospital Follow Up  Contact information: [address] [place] [address] [phone]          Allergies  Allergen Reactions  . Penicillins Hives    Has patient had a PCN reaction causing immediate rash, facial/tongue/throat swelling, SOB or lightheadedness with hypotension: no Has patient had a PCN reaction causing severe rash involving mucus membranes or skin necrosis: No  Has patient had a PCN reaction that required hospitalization: no Has patient had a PCN reaction occurring within the last 10 years: no If all of the above answers are "NO", then may proceed with Cephalosporin use.    Allergies as of 05/12/2019      Reactions   Penicillins Hives   Has patient had a PCN reaction causing immediate rash, facial/tongue/throat swelling, SOB or lightheadedness with hypotension: no Has patient had a PCN reaction causing severe rash involving mucus membranes or skin necrosis: No  Has patient had a PCN reaction that required hospitalization: no Has patient had a PCN reaction occurring within the last 10 years: no If all of the above answers are "NO", then may proceed with Cephalosporin use.      Medication List    TAKE these medications   aspirin EC 81 MG tablet Take 81 mg by mouth daily.   atorvastatin 80 MG tablet Commonly known as: LIPITOR Take 1 tablet (80 mg total) by mouth every evening.   Basaglar KwikPen 100 UNIT/ML Sopn Inject 36 Units into the skin at bedtime.   carvedilol 6.25 MG tablet Commonly known as: COREG Take 1 tablet (6.25 mg total) by mouth 2 (two) times daily. What changed:   medication strength  how much to take   colesevelam 625 MG tablet Commonly known as: WELCHOL Take 1,875 mg  by mouth 2 (two) times daily with a meal. *May take 6 tablets once a day with meals*   Eliquis 5 MG Tabs tablet Generic drug: apixaban Take 1 tablet (5 mg total) by mouth 2 (two) times daily.   furosemide 40 MG tablet Commonly known as: LASIX Take 40 mg by mouth 2 (two) times daily.   glipiZIDE 5 MG 24 hr tablet Commonly known as: GLUCOTROL XL Take 1 tablet (5 mg total) by mouth daily with breakfast.   hydrALAZINE 25 MG tablet Commonly known as: APRESOLINE Take 1 tablet (25 mg total) by mouth 3 (three) times daily.   insulin lispro 100 UNIT/ML injection Commonly known as: HUMALOG Inject 10 Units into the skin 3 (three) times daily before meals.   lisinopril 10 MG tablet Commonly known as: ZESTRIL Take 10 mg by mouth daily.   nitroGLYCERIN 0.4 MG SL tablet Commonly known as: NITROSTAT Place 1 tablet (0.4 mg total) under the tongue every 5 (five) minutes as needed for chest pain (up to 3 doses).   nystatin cream Commonly  known as: MYCOSTATIN Apply 1 application topically 2 (two) times daily as needed for dry skin.   pantoprazole 40 MG tablet Commonly known as: PROTONIX Take 1 tablet (40 mg total) by mouth daily.   tiZANidine 4 MG tablet Commonly known as: ZANAFLEX Take 4 mg by mouth at bedtime.       Procedures/Studies: Dg Chest 2 View  Result Date: 04/12/2019 CLINICAL DATA:  Acute chest pain following fall.  Initial encounter. EXAM: CHEST - 2 VIEW COMPARISON:  None. FINDINGS: The cardiomediastinal silhouette is unremarkable. Elevated RIGHT hemidiaphragm again noted. There is no evidence of focal airspace disease, pulmonary edema, suspicious pulmonary nodule/mass, pleural effusion, or pneumothorax. No acute bony abnormalities are identified. IMPRESSION: No active cardiopulmonary disease. Electronically Signed   By: Margarette Canada M.D.   On: 04/12/2019 14:32   Dg Pelvis 1-2 Views  Result Date: 04/12/2019 CLINICAL DATA:  Acute pelvic pain following fall. Initial  encounter. 01/14/2019 and prior radiographs EXAM: PELVIS - 1-2 VIEW COMPARISON:  None. FINDINGS: No acute fracture, subluxation or dislocation. Mild degenerative changes in both hips noted. A calcified fibroid is again identified. Degenerative changes in the LOWER lumbar spine again noted. IMPRESSION: No acute abnormality. Electronically Signed   By: Margarette Canada M.D.   On: 04/12/2019 14:34   Ct Head Wo Contrast  Result Date: 05/11/2019 CLINICAL DATA:  Severe headache. EXAM: CT HEAD WITHOUT CONTRAST TECHNIQUE: Contiguous axial images were obtained from the base of the skull through the vertex without intravenous contrast. COMPARISON:  CT head 05/04/2019 FINDINGS: Brain: No evidence of acute infarction, hemorrhage, hydrocephalus, extra-axial collection or mass lesion/mass effect. Mild atrophy. Vascular: Negative for hyperdense vessel Skull: Negative Sinuses/Orbits: Mucoperiosteal thickening right maxillary sinus. Remaining sinuses clear. Bilateral cataract surgery. Other: Soft tissue swelling left frontal scalp unchanged. IMPRESSION: No acute intracranial abnormality and no interval change. Electronically Signed   By: Franchot Gallo M.D.   On: 05/11/2019 13:19   Ct Head Wo Contrast  Result Date: 05/04/2019 CLINICAL DATA:  Possible stroke, weakness, right-sided weakness and numbness EXAM: CT HEAD WITHOUT CONTRAST TECHNIQUE: Contiguous axial images were obtained from the base of the skull through the vertex without intravenous contrast. COMPARISON:  04/12/2019 FINDINGS: Brain: No evidence of acute infarction, hemorrhage, hydrocephalus, extra-axial collection or mass lesion/mass effect. Vascular: No hyperdense vessel or unexpected calcification. Skull: Normal. Negative for fracture or focal lesion. Sinuses/Orbits: No acute finding. Other: There is a soft tissue contusion of the left forehead, which is improved compared to examination dated 04/12/2019. IMPRESSION: 1.  No acute intracranial pathology. 2. There is  a soft tissue contusion of the left forehead, which is improved compared to examination dated 04/12/2019. Electronically Signed   By: Eddie Candle M.D.   On: 05/04/2019 16:35   Ct Head Wo Contrast  Result Date: 04/12/2019 CLINICAL DATA:  Facial trauma. EXAM: CT HEAD WITHOUT CONTRAST CT MAXILLOFACIAL WITHOUT CONTRAST TECHNIQUE: Multidetector CT imaging of the head and maxillofacial structures were performed using the standard protocol without intravenous contrast. Multiplanar CT image reconstructions of the maxillofacial structures were also generated. COMPARISON:  February 26, 2019 head CT. FINDINGS: CT HEAD FINDINGS Brain: No subdural, epidural, or subarachnoid hemorrhage. Cerebellum, brainstem, and basal cisterns are normal. Ventricles are normal. No mass effect or midline shift. No acute cortical ischemia or infarct identified. Foci of high attenuation left basal ganglia are stable, consistent with calcifications. Vascular: No hyperdense vessel or unexpected calcification. Skull: Normal. Negative for fracture or focal lesion. Other: Soft tissue swelling is seen in the left  periorbital region, extending into the forehead with a hematoma. Left globe is intact. No other soft tissue abnormalities are noted. CT MAXILLOFACIAL FINDINGS Osseous: No fracture or mandibular dislocation. No destructive process. Orbits: The orbits are normal in appearance.  The globes are intact. Sinuses: Chronic sinus disease is seen in the right maxillary sinus. No acute sinus disease noted. No sinus wall fractures are identified. Mastoid air cells and middle ears are well aerated. Soft tissues: There is soft tissue swelling in the left periorbital region, extending into the forehead with a hematoma. Other soft tissues are unremarkable. IMPRESSION: 1. Soft tissue swelling in the left periorbital region. Hematoma over the left forehead. 2. The left globe is intact. 3. No facial bone fractures or calvarial fractures noted. 4. No acute  intracranial abnormalities are noted. Electronically Signed   By: Dorise Bullion III M.D   On: 04/12/2019 13:18   Mr Brain W Wo Contrast  Result Date: 05/05/2019 CLINICAL DATA:  Right-sided weakness EXAM: MRI HEAD WITHOUT AND WITH CONTRAST TECHNIQUE: Multiplanar, multiecho pulse sequences of the brain and surrounding structures were obtained without and with intravenous contrast. CONTRAST:  67m GADAVIST GADOBUTROL 1 MMOL/ML IV SOLN COMPARISON:  February 27, 2019 FINDINGS: Brain: There is no acute infarction or intracranial hemorrhage. There is no intracranial mass, mass effect, edema, hydrocephalus, or extra-axial fluid collection. Patchy T2 hyperintensity in the supratentorial white matter is nonspecific but may reflect stable mild chronic microvascular ischemic changes. Ventricles and sulci are normal in size. Vascular: Major vessel flow voids at the skull base are preserved. Skull and upper cervical spine: Marrow signal is within normal limits. Cervical spine is better evaluated on concurrent dedicated imaging. Sinuses/Orbits: Mild paranasal sinus mucosal thickening. Bilateral lens replacements. Other: Left frontal scalp hematoma as seen on prior CT imaging. Trace right mastoid fluid opacification. IMPRESSION: No evidence of acute infarction, intracranial hemorrhage, or mass. Stable mild chronic microvascular ischemic changes. Electronically Signed   By: PMacy MisM.D.   On: 05/05/2019 11:13   Mr Thoracic Spine Wo Contrast  Result Date: 05/06/2019 CLINICAL DATA:  Progressive ataxia. Upper back pain for 2 months. EXAM: MRI THORACIC SPINE WITHOUT CONTRAST TECHNIQUE: Multiplanar, multisequence MR imaging of the thoracic spine was performed. No intravenous contrast was administered. COMPARISON:  Chest x-ray dated 04/12/2019 FINDINGS: Alignment: 2 mm retrolisthesis of T11 on T12. Alignment is otherwise normal. Vertebrae: Ankylosis of the T10 and T11 vertebral bodies. No fracture, evidence of discitis,  or bone lesion. Cord:  Normal signal and morphology.  Tip of the conus is at L1. Paraspinal and other soft tissues: Negative. Disc levels: T1-2 through T6-7: No significant disc bulging or disc protrusion. No spinal or foraminal stenosis. T7-8: Tiny central disc bulge with no neural impingement. Otherwise negative. T8-9 and T9-10: Normal. T10-11: Marked disc space narrowing. Small broad-based endplate osteophytes symmetrically indent the ventral aspect of the thecal sac without focal neural impingement. There is severe left foraminal stenosis which could affect the left T10 nerve. There is ankylosis of the T10 and T11 vertebral bodies. T11-12: Marked disc space narrowing. Slight retrolisthesis. Small broad-based endplate osteophytes do not create significant spinal stenosis. No significant foraminal stenosis. T12-L1: Negative. IMPRESSION: 1. Severe left foraminal stenosis at T10-11 which could affect the left T10 nerve. 2. Ankylosis of the T10 and T11 vertebral bodies. 3. No other significant abnormalities. Specifically, no evidence to explain the patient's progressive ataxia. Electronically Signed   By: JLorriane ShireM.D.   On: 05/06/2019 09:57   Mr Cervical Spine  W Wo Contrast  Result Date: 05/05/2019 CLINICAL DATA:  Radiculopathy, right-sided weakness EXAM: MRI CERVICAL SPINE WITHOUT AND WITH CONTRAST TECHNIQUE: Multiplanar and multiecho pulse sequences of the cervical spine, to include the craniocervical junction and cervicothoracic junction, were obtained without and with intravenous contrast. CONTRAST:  26m GADAVIST GADOBUTROL 1 MMOL/ML IV SOLN COMPARISON:  None. FINDINGS: Alignment: Straightening of the cervical lordosis. There is focal kyphosis at C5-C6. Vertebrae: Vertebral body heights are maintained apart from anterior wedging and endplate irregularity at C5 and C6. There is no significant marrow edema. No suspicious osseous lesion. Cord: No abnormal cord signal. Posterior Fossa, vertebral arteries,  paraspinal tissues: Unremarkable. Disc levels: C2-C3:  Disc bulge.  No significant canal or foraminal stenosis. C3-C4: Small central disc protrusion. Mild canal stenosis. No foraminal stenosis. C4-C5: Very small central disc protrusion. Mild facet and uncovertebral hypertrophy. No canal or foraminal stenosis. C5-C6: Right central disc extrusion extending below the disc level, endplate osteophytes, and facet and uncovertebral hypertrophy. Mild canal stenosis with flattening of the right ventral aspect of the cord. No foraminal stenosis. C6-C7: Disc bulge, endplate osteophytes, and facet and uncovertebral hypertrophy. Moderate canal stenosis. Marked foraminal stenosis, right greater than left. C7-T1:  Facet hypertrophy.  No canal or foraminal stenosis. IMPRESSION: Multilevel degenerative changes as detailed above, greatest at C5-C6 and C6-C7. Electronically Signed   By: PMacy MisM.D.   On: 05/05/2019 11:31   Mr Lumbar Spine W Wo Contrast  Result Date: 05/05/2019 CLINICAL DATA:  Radiculopathy, right-sided weakness EXAM: MRI LUMBAR SPINE WITHOUT AND WITH CONTRAST TECHNIQUE: Multiplanar and multiecho pulse sequences of the lumbar spine were obtained without and with intravenous contrast. CONTRAST:  779mGADAVIST GADOBUTROL 1 MMOL/ML IV SOLN COMPARISON:  None. FINDINGS: Motion artifact is present. Segmentation: For the purposes of this dictation, there are 5 lumbar type vertebral bodies with the caudal most designated L5. Alignment:  There is mild anterolisthesis at L3-L4. Vertebrae: Vertebral body heights are maintained apart from degenerative endplate irregularity and Schmorl's nodes, greatest at L1-L2 and L2-L3. There is degenerative endplate marrow edema at the levels as well. Degenerative marrow edema is also present at the right L4-L5 facet joint. Conus medullaris and cauda equina: Conus extends to the L1 level. Conus and cauda equina appear normal. Paraspinal and other soft tissues: Unremarkable. Disc  levels: L1-L2: Disc bulge and facet arthropathy with ligamentum flavum infolding. Mild canal stenosis. Mild to moderate right and mild left foraminal stenosis. L2-L3: Disc bulge and facet arthropathy (marked on the right) with ligamentum flavum infolding. Moderate canal stenosis with narrowing of the lateral recesses. Mild foraminal stenosis. L3-L4: Anterolisthesis with uncovering of disc bulge and marked facet arthropathy with ligamentum flavum infolding. Marked canal stenosis with narrowing of the lateral recesses. Moderate to marked right and mild left foraminal stenosis. L4-L5: Disc bulge with endplate osteophytic ridging. Moderate to marked facet arthropathy with joint effusion on the right and ligamentum flavum infolding. Moderate canal stenosis with narrowing of the lateral recesses. Moderate to marked foraminal stenosis. L5-S1: Small right far lateral disc protrusion. Moderate facet arthropathy. No canal stenosis. Mild right foraminal stenosis. No left foraminal stenosis. IMPRESSION: Multilevel degenerative changes as detailed above. Canal and lateral recess stenosis are greatest at L3-L4. Right foraminal stenosis is greatest at L3-L4 and L4-L5. Electronically Signed   By: PrMacy Mis.D.   On: 05/05/2019 11:44   Ct Maxillofacial Wo Contrast  Result Date: 04/12/2019 CLINICAL DATA:  Facial trauma. EXAM: CT HEAD WITHOUT CONTRAST CT MAXILLOFACIAL WITHOUT CONTRAST TECHNIQUE: Multidetector CT  imaging of the head and maxillofacial structures were performed using the standard protocol without intravenous contrast. Multiplanar CT image reconstructions of the maxillofacial structures were also generated. COMPARISON:  February 26, 2019 head CT. FINDINGS: CT HEAD FINDINGS Brain: No subdural, epidural, or subarachnoid hemorrhage. Cerebellum, brainstem, and basal cisterns are normal. Ventricles are normal. No mass effect or midline shift. No acute cortical ischemia or infarct identified. Foci of high attenuation  left basal ganglia are stable, consistent with calcifications. Vascular: No hyperdense vessel or unexpected calcification. Skull: Normal. Negative for fracture or focal lesion. Other: Soft tissue swelling is seen in the left periorbital region, extending into the forehead with a hematoma. Left globe is intact. No other soft tissue abnormalities are noted. CT MAXILLOFACIAL FINDINGS Osseous: No fracture or mandibular dislocation. No destructive process. Orbits: The orbits are normal in appearance.  The globes are intact. Sinuses: Chronic sinus disease is seen in the right maxillary sinus. No acute sinus disease noted. No sinus wall fractures are identified. Mastoid air cells and middle ears are well aerated. Soft tissues: There is soft tissue swelling in the left periorbital region, extending into the forehead with a hematoma. Other soft tissues are unremarkable. IMPRESSION: 1. Soft tissue swelling in the left periorbital region. Hematoma over the left forehead. 2. The left globe is intact. 3. No facial bone fractures or calvarial fractures noted. 4. No acute intracranial abnormalities are noted. Electronically Signed   By: Dorise Bullion III M.D   On: 04/12/2019 13:18      Subjective: Pt says she feels much better today.  She wants to go home and she will follow up with PCP and neurologist.   Discharge Exam: Vitals:   05/12/19 0510 05/12/19 0841  BP: 120/75 112/76  Pulse: 77 82  Resp: 16 16  Temp: 98.1 F (36.7 C) 98 F (36.7 C)  SpO2: 98% 98%   Vitals:   05/11/19 1441 05/11/19 2159 05/12/19 0510 05/12/19 0841  BP: (!) 137/98 124/62 120/75 112/76  Pulse: 87 84 77 82  Resp: 16 20 16 16   Temp: 98 F (36.7 C) 98.8 F (37.1 C) 98.1 F (36.7 C) 98 F (36.7 C)  TempSrc: Oral Oral Oral Oral  SpO2: 97% 100% 98% 98%  Weight:      Height:        General: Pt is alert, awake, not in acute distress Cardiovascular: RRR, S1/S2 +, no rubs, no gallops Respiratory: CTA bilaterally, no wheezing, no  rhonchi Abdominal: Soft, NT, ND, bowel sounds + Extremities: no edema, no cyanosis Neurological: nonfocal exam.     The results of significant diagnostics from this hospitalization (including imaging, microbiology, ancillary and laboratory) are listed below for reference.     Microbiology: Recent Results (from the past 240 hour(s))  SARS CORONAVIRUS 2 (TAT 6-24 HRS) Nasopharyngeal Nasopharyngeal Swab     Status: None   Collection Time: 05/04/19  7:20 PM   Specimen: Nasopharyngeal Swab  Result Value Ref Range Status   SARS Coronavirus 2 NEGATIVE NEGATIVE Final    Comment: (NOTE) SARS-CoV-2 target nucleic acids are NOT DETECTED. The SARS-CoV-2 RNA is generally detectable in upper and lower respiratory specimens during the acute phase of infection. Negative results do not preclude SARS-CoV-2 infection, do not rule out co-infections with other pathogens, and should not be used as the sole basis for treatment or other patient management decisions. Negative results must be combined with clinical observations, patient history, and epidemiological information. The expected result is Negative. Fact Sheet for Patients: SugarRoll.be  Fact Sheet for Healthcare Providers: https://www.woods-mathews.com/ This test is not yet approved or cleared by the [place] FDA and  has been authorized for detection and/or diagnosis of SARS-CoV-2 by FDA under an Emergency Use Authorization (EUA). This EUA will remain  in effect (meaning this test can be used) for the duration of the COVID-19 declaration under Section 56 4(b)(1) of the Act, 21 U.S.C. section 360bbb-3(b)(1), unless the authorization is terminated or revoked sooner. Performed at [facility], [place] [address] [place], [place] [address]   Culture, Urine     Status: Abnormal   Collection Time: 05/05/19  8:21 AM   Specimen: Urine, Clean Catch  Result Value Ref Range Status   Specimen  Description   Final    URINE, CLEAN CATCH Performed at [facility], [address], [place], [place] [address]    Special Requests   Final    NONE Performed at [facility], [address], [place], [place] [address]    Culture MULTIPLE SPECIES PRESENT, SUGGEST RECOLLECTION (A)  Final   Report Status 05/07/2019 FINAL  Final     Labs: BNP (last 3 results) No results for input(s): BNP in the last 8760 hours. Basic Metabolic Panel: Recent Labs  Lab 05/07/19 0554 05/08/19 0917 05/09/19 0509 05/10/19 0705  NA 136 136 142 140  K 4.6 5.5* 4.7 4.4  CL 105 104 110 109  CO2 22 22 26 22   GLUCOSE 394* 319* 102* 119*  BUN 36* 39* 37* 41*  CREATININE 1.34* 1.32* 1.41* 1.33*  CALCIUM 9.9 10.3 9.5 9.7   Liver Function Tests: No results for input(s): AST, ALT, ALKPHOS, BILITOT, PROT, ALBUMIN in the last 168 hours. No results for input(s): LIPASE, AMYLASE in the last 168 hours. No results for input(s): AMMONIA in the last 168 hours. CBC: No results for input(s): WBC, NEUTROABS, HGB, HCT, MCV, PLT in the last 168 hours. Cardiac Enzymes: Recent Labs  Lab 05/06/19 2244  CKTOTAL 108  CKMB 3.9   BNP: Invalid input(s): POCBNP CBG: Recent Labs  Lab 05/11/19 0749 05/11/19 1106 05/11/19 1612 05/12/19 0357 05/12/19 0744  GLUCAP 168* 182* 220* 120* 113*   D-Dimer No results for input(s): DDIMER in the last 72 hours. Hgb A1c No results for input(s): HGBA1C in the last 72 hours. Lipid Profile No results for input(s): CHOL, HDL, LDLCALC, TRIG, CHOLHDL, LDLDIRECT in the last 72 hours. Thyroid function studies No results for input(s): TSH, T4TOTAL, T3FREE, THYROIDAB in the last 72 hours.  Invalid input(s): FREET3 Anemia work up No results for input(s): VITAMINB12, FOLATE, FERRITIN, TIBC, IRON, RETICCTPCT in the last 72 hours. Urinalysis    Component Value Date/Time   COLORURINE YELLOW 05/05/2019 0821   APPEARANCEUR CLEAR 05/05/2019 0821   LABSPEC 1.019 05/05/2019 0821   PHURINE  5.0 05/05/2019 0821   GLUCOSEU 150 (A) 05/05/2019 0821   HGBUR NEGATIVE 05/05/2019 0821   BILIRUBINUR NEGATIVE 05/05/2019 0821   KETONESUR NEGATIVE 05/05/2019 0821   PROTEINUR 30 (A) 05/05/2019 0821   UROBILINOGEN 0.2 09/15/2012 1010   NITRITE NEGATIVE 05/05/2019 0821   LEUKOCYTESUR LARGE (A) 05/05/2019 0821   Sepsis Labs Invalid input(s): PROCALCITONIN,  WBC,  LACTICIDVEN Microbiology Recent Results (from the past 240 hour(s))  SARS CORONAVIRUS 2 (TAT 6-24 HRS) Nasopharyngeal Nasopharyngeal Swab     Status: None   Collection Time: 05/04/19  7:20 PM   Specimen: Nasopharyngeal Swab  Result Value Ref Range Status   SARS Coronavirus 2 NEGATIVE NEGATIVE Final    Comment: (NOTE) SARS-CoV-2  target nucleic acids are NOT DETECTED. The SARS-CoV-2 RNA is generally detectable in upper and lower respiratory specimens during the acute phase of infection. Negative results do not preclude SARS-CoV-2 infection, do not rule out co-infections with other pathogens, and should not be used as the sole basis for treatment or other patient management decisions. Negative results must be combined with clinical observations, patient history, and epidemiological information. The expected result is Negative. Fact Sheet for Patients: SugarRoll.be Fact Sheet for Healthcare Providers: https://www.woods-mathews.com/ This test is not yet approved or cleared by the [place] FDA and  has been authorized for detection and/or diagnosis of SARS-CoV-2 by FDA under an Emergency Use Authorization (EUA). This EUA will remain  in effect (meaning this test can be used) for the duration of the COVID-19 declaration under Section 56 4(b)(1) of the Act, 21 U.S.C. section 360bbb-3(b)(1), unless the authorization is terminated or revoked sooner. Performed at [facility], [place] [address] [place], [place] [address]   Culture, Urine     Status: Abnormal   Collection Time:  05/05/19  8:21 AM   Specimen: Urine, Clean Catch  Result Value Ref Range Status   Specimen Description   Final    URINE, CLEAN CATCH Performed at [facility], [address] [place], [place] [address]    Special Requests   Final    NONE Performed at [facility], [address], [place], [place] [address]    Culture MULTIPLE SPECIES PRESENT, SUGGEST RECOLLECTION (A)  Final   Report Status 05/07/2019 FINAL  Final    Time coordinating discharge: 33 minutes   SIGNED:  Irwin Brakeman, MD  [facility] 05/12/2019, 12:51 PM How to contact the [facility] Attending or Consulting provider [place] or covering provider during after hours [place], for this patient?  1. Check the care team in [facility] and look for a) attending/consulting TRH provider listed and b) the [facility] team listed 2. Log into [email or web address] and use [place]'s universal password to access. If you do not have the password, please contact the hospital operator. 3. Locate the [facility] provider you are looking for under [facility] and page to a number that you can be directly reached. 4. If you still have difficulty reaching the provider, please page the [facility] (Director on Call) for the Hospitalists listed on amion for assistance.

## 2019-05-12 NOTE — TOC Transition Note (Signed)
Transition of Care [facility]) - CM/SW Discharge Note   Patient Details  Name: Cassandra Adams MRN: [id number] Date of Birth: [birth year]  Transition of Care [facility]) CM/SW Contact:  Shade Flood, LCSW Phone Number: 05/12/2019, 1:01 PM   Clinical Narrative:     Pt stable for dc home today. [facility] will follow pt at home as planned. Updated Linda at [place]. There are no other TOC needs for dc.  Final next level of care: [place] Barriers to Discharge: Barriers Resolved   Patient Goals and CMS Choice Patient states their goals for this hospitalization and ongoing recovery are:: feel better and return home CMS Medicare.gov Compare Post Acute Care list provided to:: Patient Choice offered to / list presented to : Patient  Discharge Placement                       Discharge Plan and Services   Discharge Planning Services: CM Consult Post Acute Care Choice: [place]: PT [facility] Agency: [place] ([place]) Date [place]: 05/07/19 Time [place]: 2197 Representative spoke with at [place]: [place] ([place]) Interventions     Readmission Risk Interventions Readmission Risk Prevention Plan 05/09/2019  Transportation Screening Complete  Home Care Screening Complete  Medication Review (RN CM) Complete  Some recent data might be hidden

## 2019-05-12 NOTE — Progress Notes (Signed)
Nsg Discharge Note  Admit Date:  05/04/2019 Discharge date: 05/12/2019   LOUCILE POSNER to be D/C'd Home  per MD order.  AVS completed. Patient able to verbalize understanding.  Discharge Medication: Allergies as of 05/12/2019      Reactions   Penicillins Hives   Has patient had a PCN reaction causing immediate rash, facial/tongue/throat swelling, SOB or lightheadedness with hypotension: no Has patient had a PCN reaction causing severe rash involving mucus membranes or skin necrosis: No  Has patient had a PCN reaction that required hospitalization: no Has patient had a PCN reaction occurring within the last 10 years: no If all of the above answers are "NO", then may proceed with Cephalosporin use.      Medication List    TAKE these medications   aspirin EC 81 MG tablet Take 81 mg by mouth daily.   atorvastatin 80 MG tablet Commonly known as: LIPITOR Take 1 tablet (80 mg total) by mouth every evening.   Basaglar KwikPen 100 UNIT/ML Sopn Inject 36 Units into the skin at bedtime.   carvedilol 6.25 MG tablet Commonly known as: COREG Take 1 tablet (6.25 mg total) by mouth 2 (two) times daily. What changed:   medication strength  how much to take   colesevelam 625 MG tablet Commonly known as: WELCHOL Take 1,875 mg by mouth 2 (two) times daily with a meal. *May take 6 tablets once a day with meals*   Eliquis 5 MG Tabs tablet Generic drug: apixaban Take 1 tablet (5 mg total) by mouth 2 (two) times daily.   furosemide 40 MG tablet Commonly known as: LASIX Take 40 mg by mouth 2 (two) times daily.   glipiZIDE 5 MG 24 hr tablet Commonly known as: GLUCOTROL XL Take 1 tablet (5 mg total) by mouth daily with breakfast.   hydrALAZINE 25 MG tablet Commonly known as: APRESOLINE Take 1 tablet (25 mg total) by mouth 3 (three) times daily.   insulin lispro 100 UNIT/ML injection Commonly known as: HUMALOG Inject 10 Units into the skin 3 (three) times daily before meals.   lisinopril 10 MG tablet Commonly known as: ZESTRIL Take 10 mg by mouth daily.   nitroGLYCERIN 0.4 MG SL tablet Commonly known as: NITROSTAT Place 1 tablet (0.4 mg total) under the tongue every 5 (five) minutes as needed for chest pain (up to 3 doses).   nystatin cream Commonly known as: MYCOSTATIN Apply 1 application topically 2 (two) times daily as needed for dry skin.   pantoprazole 40 MG tablet Commonly known as: PROTONIX Take 1 tablet (40 mg total) by mouth daily.   tiZANidine 4 MG tablet Commonly known as: ZANAFLEX Take 4 mg by mouth at bedtime.       Discharge Assessment: Vitals:   05/12/19 0510 05/12/19 0841  BP: 120/75 112/76  Pulse: 77 82  Resp: 16 16  Temp: 98.1 F (36.7 C) 98 F (36.7 C)  SpO2: 98% 98%   Skin clean, dry and intact without evidence of skin break down, no evidence of skin tears noted. IV catheter discontinued intact. Site without signs and symptoms of complications - no redness or edema noted at insertion site, patient denies c/o pain - only slight tenderness at site.  Dressing with slight pressure applied.  D/c Instructions-Education: Discharge instructions given to patient with verbalized understanding. D/c education completed with patient including follow up instructions, medication list, d/c activities limitations if indicated, with other d/c instructions as indicated by MD - patient able to verbalize understanding,  all questions fully answered. Patient instructed to return to ED, call 911, or call MD for any changes in condition.  Patient escorted via [place], and D/C home via private auto.  Berton Bon, RN 05/12/2019 2:32 PM

## 2019-05-12 NOTE — Progress Notes (Signed)
Physical Therapy Treatment Patient Details Name: Cassandra Adams MRN: [id number] DOB: 1946-06-25 Today's Date: 05/12/2019    History of Present Illness CHASYA ZENZ is a 73 y.o. female with medical history significant of hypertension, hyperlipidemia, diabetes mellitus type 2, paroxysmal atrial fibrillation, coronary artery disease, history of TIA who presented to the ER with right-sided weakness.  Patient states she was doing well until around noon on 05/04/2019.  She says she had woken up at 4:30 in the morning when she was fine and had gone to the bathroom and then went back to sleep.  When she woke up at around noon she noted she had significant right-sided weakness mainly in the right lower extremity.  She did complain of some right upper extremity weakness as well. She also reports having decreased sensation on the right upper and lower extremity. She does have difficulty with speaking and has a very hoarse and soft voice and only is able to speak in whispers.  She was seen by ENT as an outpatient by Dr. Lorelee Cover and apparently had a laryngoscopy, reports not available to the patient says there may have been some issue with vocal cords.  She was referred to a specialist in [place] as per her.  She does have feeling of dysphagia with solid foods feeling that they get stuck in throat occasionally.  The symptoms are present since prior admission.Review of records shows patient was admitted from 02/26/2019-02/27/2019 with difficulty speaking, left-sided weakness and had an MRI of her brain at the time to rule out stroke which was noted to be negative.  She was thought to have possible TIA as well as concern for vocal cord dysfunction at the time.    PT Comments    Patient demonstrates increased endurance/distance for ambulation in room, hallways without loss of balance, no c/o pain and speaking normally.  Patient tolerated sitting up in chair after therapy.  Patient will benefit from continued  physical therapy in hospital and recommended venue below to increase strength, balance, endurance for safe ADLs and gait.    Follow Up Recommendations  Home health PT;Supervision for mobility/OOB;Supervision - Intermittent     Equipment Recommendations  None recommended by PT    Recommendations for Other Services       Precautions / Restrictions Precautions Precautions: None Restrictions Weight Bearing Restrictions: No    Mobility  Bed Mobility Overal bed mobility: Independent                Transfers Overall transfer level: Modified independent               General transfer comment: requires less assistance  Ambulation/Gait Ambulation/Gait assistance: Supervision Gait Distance (Feet): 150 Feet Assistive device: Rolling walker (2 wheeled) Gait Pattern/deviations: Decreased step length - right;Decreased step length - left;Decreased stride length Gait velocity: slightly decreased   General Gait Details: increased endurance/distance for ambulation without loss of balance   Stairs             Wheelchair Mobility    Modified Rankin (Stroke Patients Only)       Balance Overall balance assessment: Needs assistance Sitting-balance support: Feet supported;No upper extremity supported Sitting balance-Leahy Scale: Good Sitting balance - Comments: seated at EOB   Standing balance support: During functional activity;No upper extremity supported Standing balance-Leahy Scale: Fair Standing balance comment: fair/good using RW  Cognition Arousal/Alertness: Awake/alert Behavior During Therapy: WFL for tasks assessed/performed Overall Cognitive Status: Within Functional Limits for tasks assessed                                        Exercises General Exercises - Lower Extremity Long Arc Quad: Seated;AROM;Strengthening;Both;10 reps Hip Flexion/Marching: Seated;AROM;Strengthening;Both;10 reps Toe  Raises: Seated;AROM;Strengthening;Both;10 reps Heel Raises: Seated;AROM;Strengthening;Both;10 reps    General Comments        Pertinent Vitals/Pain Pain Assessment: No/denies pain    Home Living                      Prior Function            PT Goals (current goals can now be found in the care plan section) Acute Rehab PT Goals Patient Stated Goal: return home with family to assist PT Goal Formulation: With patient Time For Goal Achievement: 05/13/19 Potential to Achieve Goals: Good Progress towards PT goals: Progressing toward goals    Frequency    Min 3X/week      PT Plan Current plan remains appropriate    Co-evaluation              AM-PAC PT "6 Clicks" Mobility   Outcome Measure  Help needed turning from your back to your side while in a flat bed without using bedrails?: None Help needed moving from lying on your back to sitting on the side of a flat bed without using bedrails?: None Help needed moving to and from a bed to a chair (including a wheelchair)?: None Help needed standing up from a chair using your arms (e.g., wheelchair or bedside chair)?: None Help needed to walk in hospital room?: None Help needed climbing 3-5 steps with a railing? : A Little 6 Click Score: 23    End of Session   Activity Tolerance: Patient tolerated treatment well Patient left: in chair;with call bell/phone within reach Nurse Communication: Mobility status PT Visit Diagnosis: Unsteadiness on feet (R26.81);Other abnormalities of gait and mobility (R26.89);Muscle weakness (generalized) (M62.81)     Time: 7371-0626 PT Time Calculation (min) (ACUTE ONLY): 23 min  Charges:  $Gait Training: 8-22 mins $Therapeutic Exercise: 8-22 mins                     10:26 AM, 05/12/19 Lonell Grandchild, MPT Physical Therapist with [facility] [phone] office [phone] mobile phone

## 2019-05-13 ENCOUNTER — Telehealth: Payer: Self-pay | Admitting: "Endocrinology

## 2019-05-13 ENCOUNTER — Other Ambulatory Visit: Payer: Self-pay

## 2019-05-13 DIAGNOSIS — I2510 Atherosclerotic heart disease of native coronary artery without angina pectoris: Secondary | ICD-10-CM | POA: Diagnosis not present

## 2019-05-13 DIAGNOSIS — N184 Chronic kidney disease, stage 4 (severe): Secondary | ICD-10-CM

## 2019-05-13 DIAGNOSIS — I69341 Monoplegia of lower limb following cerebral infarction affecting right dominant side: Secondary | ICD-10-CM | POA: Diagnosis not present

## 2019-05-13 DIAGNOSIS — M40202 Unspecified kyphosis, cervical region: Secondary | ICD-10-CM | POA: Diagnosis not present

## 2019-05-13 DIAGNOSIS — I129 Hypertensive chronic kidney disease with stage 1 through stage 4 chronic kidney disease, or unspecified chronic kidney disease: Secondary | ICD-10-CM | POA: Diagnosis not present

## 2019-05-13 DIAGNOSIS — E1142 Type 2 diabetes mellitus with diabetic polyneuropathy: Secondary | ICD-10-CM | POA: Diagnosis not present

## 2019-05-13 DIAGNOSIS — N183 Chronic kidney disease, stage 3 unspecified: Secondary | ICD-10-CM | POA: Diagnosis not present

## 2019-05-13 DIAGNOSIS — E0822 Diabetes mellitus due to underlying condition with diabetic chronic kidney disease: Secondary | ICD-10-CM

## 2019-05-13 DIAGNOSIS — E1165 Type 2 diabetes mellitus with hyperglycemia: Secondary | ICD-10-CM | POA: Diagnosis not present

## 2019-05-13 DIAGNOSIS — M48061 Spinal stenosis, lumbar region without neurogenic claudication: Secondary | ICD-10-CM | POA: Diagnosis not present

## 2019-05-13 DIAGNOSIS — M4804 Spinal stenosis, thoracic region: Secondary | ICD-10-CM | POA: Diagnosis not present

## 2019-05-13 DIAGNOSIS — E1122 Type 2 diabetes mellitus with diabetic chronic kidney disease: Secondary | ICD-10-CM | POA: Diagnosis not present

## 2019-05-13 LAB — GLUCOSE, CAPILLARY: Glucose-Capillary: 212 mg/dL — ABNORMAL HIGH (ref 70–99)

## 2019-05-13 NOTE — Telephone Encounter (Signed)
Pt daughter notified   

## 2019-05-13 NOTE — Telephone Encounter (Signed)
Pt states she has had low BG readings.   Date Before breakfast Before lunch Before supper Bedtime  11/16   89 49  11/17 45 49                  Pt taking:  Basaglar 36 units qhs       Humalog 10-16 units tidac.         Glipizide XL 5mg   Pt was discharged from [facility] yesterday. Did not take any insulin.

## 2019-05-13 NOTE — Care Management Important Message (Signed)
Important Message  Patient Details  Name: Cassandra Adams MRN: [id number] Date of Birth: 1945-09-21   Medicare Important Message Given:  Yes     Tommy Medal 05/13/2019, 3:25 PM

## 2019-05-13 NOTE — Telephone Encounter (Signed)
Advise to lower Basaglar to 20 units qhs, d/c Humalog, continue Glipizide 5mg  po with breakfast.

## 2019-05-14 ENCOUNTER — Other Ambulatory Visit: Payer: Self-pay | Admitting: *Deleted

## 2019-05-14 DIAGNOSIS — R69 Illness, unspecified: Secondary | ICD-10-CM | POA: Diagnosis not present

## 2019-05-14 DIAGNOSIS — J385 Laryngeal spasm: Secondary | ICD-10-CM | POA: Diagnosis not present

## 2019-05-14 NOTE — Patient Outreach (Signed)
Referral received from hospital liason, pt hospitalized 11/8-11/16/20 with CVA, pt with diagnoses chronic vertigo, chronic headaches, Afib with RVR, DM type 2 uncontrolled with AIC=9.8, CKD stage 3-4, history NSTEMI.  Primary MD completes transition of care.  Outreach call to pt for screening, no answer to telephone and no option to leave voicemail,  RN CM mailed unsuccessful outreach letter to pt home.  PLAN Outreach pt in 3-4 business days  Jacqlyn Larsen [facility], [place] [phone]

## 2019-05-15 DIAGNOSIS — R69 Illness, unspecified: Secondary | ICD-10-CM | POA: Diagnosis not present

## 2019-05-16 DIAGNOSIS — M48061 Spinal stenosis, lumbar region without neurogenic claudication: Secondary | ICD-10-CM | POA: Diagnosis not present

## 2019-05-16 DIAGNOSIS — N183 Chronic kidney disease, stage 3 unspecified: Secondary | ICD-10-CM | POA: Diagnosis not present

## 2019-05-16 DIAGNOSIS — Z6834 Body mass index (BMI) 34.0-34.9, adult: Secondary | ICD-10-CM | POA: Diagnosis not present

## 2019-05-16 DIAGNOSIS — I2510 Atherosclerotic heart disease of native coronary artery without angina pectoris: Secondary | ICD-10-CM | POA: Diagnosis not present

## 2019-05-16 DIAGNOSIS — E1122 Type 2 diabetes mellitus with diabetic chronic kidney disease: Secondary | ICD-10-CM | POA: Diagnosis not present

## 2019-05-16 DIAGNOSIS — I10 Essential (primary) hypertension: Secondary | ICD-10-CM | POA: Diagnosis not present

## 2019-05-16 DIAGNOSIS — E1151 Type 2 diabetes mellitus with diabetic peripheral angiopathy without gangrene: Secondary | ICD-10-CM | POA: Diagnosis not present

## 2019-05-16 DIAGNOSIS — E210 Primary hyperparathyroidism: Secondary | ICD-10-CM | POA: Diagnosis not present

## 2019-05-16 DIAGNOSIS — E1142 Type 2 diabetes mellitus with diabetic polyneuropathy: Secondary | ICD-10-CM | POA: Diagnosis not present

## 2019-05-16 DIAGNOSIS — I69341 Monoplegia of lower limb following cerebral infarction affecting right dominant side: Secondary | ICD-10-CM | POA: Diagnosis not present

## 2019-05-16 DIAGNOSIS — M40202 Unspecified kyphosis, cervical region: Secondary | ICD-10-CM | POA: Diagnosis not present

## 2019-05-16 DIAGNOSIS — E1165 Type 2 diabetes mellitus with hyperglycemia: Secondary | ICD-10-CM | POA: Diagnosis not present

## 2019-05-16 DIAGNOSIS — M4804 Spinal stenosis, thoracic region: Secondary | ICD-10-CM | POA: Diagnosis not present

## 2019-05-16 DIAGNOSIS — I129 Hypertensive chronic kidney disease with stage 1 through stage 4 chronic kidney disease, or unspecified chronic kidney disease: Secondary | ICD-10-CM | POA: Diagnosis not present

## 2019-05-16 DIAGNOSIS — E6609 Other obesity due to excess calories: Secondary | ICD-10-CM | POA: Diagnosis not present

## 2019-05-16 LAB — ACETYLCHOLINE RECEPTOR AB, ALL
Acety choline binding ab: 0.1 nmol/L (ref 0.00–0.24)
Acetylchol Block Ab: 17 % (ref 0–25)
Acetylcholine Modulat Ab: <12 % (ref 0–20)

## 2019-05-16 NOTE — Telephone Encounter (Signed)
Left pt's daughter a VM

## 2019-05-16 NOTE — Telephone Encounter (Signed)
Pt's daughter called, she said since stopping the humalog, her readings have increased.  11/18 Lunch 327, Dinner 427, bedtime 504  11/19 Morning 210, Lunch 250, Supper 302, bedtime 415  This morning 381

## 2019-05-16 NOTE — Telephone Encounter (Signed)
Advise to increase Basaglar to 30 units nightly, restart Humalog 5 units 3 times a day with meals.

## 2019-05-19 ENCOUNTER — Other Ambulatory Visit: Payer: Self-pay | Admitting: *Deleted

## 2019-05-19 DIAGNOSIS — M4804 Spinal stenosis, thoracic region: Secondary | ICD-10-CM | POA: Diagnosis not present

## 2019-05-19 DIAGNOSIS — I129 Hypertensive chronic kidney disease with stage 1 through stage 4 chronic kidney disease, or unspecified chronic kidney disease: Secondary | ICD-10-CM | POA: Diagnosis not present

## 2019-05-19 DIAGNOSIS — I2510 Atherosclerotic heart disease of native coronary artery without angina pectoris: Secondary | ICD-10-CM | POA: Diagnosis not present

## 2019-05-19 DIAGNOSIS — E1165 Type 2 diabetes mellitus with hyperglycemia: Secondary | ICD-10-CM | POA: Diagnosis not present

## 2019-05-19 DIAGNOSIS — M40202 Unspecified kyphosis, cervical region: Secondary | ICD-10-CM | POA: Diagnosis not present

## 2019-05-19 DIAGNOSIS — E1122 Type 2 diabetes mellitus with diabetic chronic kidney disease: Secondary | ICD-10-CM | POA: Diagnosis not present

## 2019-05-19 DIAGNOSIS — M48061 Spinal stenosis, lumbar region without neurogenic claudication: Secondary | ICD-10-CM | POA: Diagnosis not present

## 2019-05-19 DIAGNOSIS — N183 Chronic kidney disease, stage 3 unspecified: Secondary | ICD-10-CM | POA: Diagnosis not present

## 2019-05-19 DIAGNOSIS — E1142 Type 2 diabetes mellitus with diabetic polyneuropathy: Secondary | ICD-10-CM | POA: Diagnosis not present

## 2019-05-19 DIAGNOSIS — I69341 Monoplegia of lower limb following cerebral infarction affecting right dominant side: Secondary | ICD-10-CM | POA: Diagnosis not present

## 2019-05-19 NOTE — Patient Outreach (Signed)
Outreach call to pt for screening (2nd attempt), no answer to telephone and no option to leave voicemail.  PLAN Outreach pt in 3-4 business days  Regnald Bowens RNC, BSN [facility] Coordinator [phone]   

## 2019-05-26 ENCOUNTER — Other Ambulatory Visit: Payer: Self-pay | Admitting: *Deleted

## 2019-05-26 ENCOUNTER — Telehealth: Payer: Self-pay | Admitting: "Endocrinology

## 2019-05-26 DIAGNOSIS — E1142 Type 2 diabetes mellitus with diabetic polyneuropathy: Secondary | ICD-10-CM | POA: Diagnosis not present

## 2019-05-26 DIAGNOSIS — E1122 Type 2 diabetes mellitus with diabetic chronic kidney disease: Secondary | ICD-10-CM | POA: Diagnosis not present

## 2019-05-26 DIAGNOSIS — N183 Chronic kidney disease, stage 3 unspecified: Secondary | ICD-10-CM | POA: Diagnosis not present

## 2019-05-26 DIAGNOSIS — I2510 Atherosclerotic heart disease of native coronary artery without angina pectoris: Secondary | ICD-10-CM | POA: Diagnosis not present

## 2019-05-26 DIAGNOSIS — I129 Hypertensive chronic kidney disease with stage 1 through stage 4 chronic kidney disease, or unspecified chronic kidney disease: Secondary | ICD-10-CM | POA: Diagnosis not present

## 2019-05-26 DIAGNOSIS — M4804 Spinal stenosis, thoracic region: Secondary | ICD-10-CM | POA: Diagnosis not present

## 2019-05-26 DIAGNOSIS — M48061 Spinal stenosis, lumbar region without neurogenic claudication: Secondary | ICD-10-CM | POA: Diagnosis not present

## 2019-05-26 DIAGNOSIS — E1165 Type 2 diabetes mellitus with hyperglycemia: Secondary | ICD-10-CM | POA: Diagnosis not present

## 2019-05-26 DIAGNOSIS — M40202 Unspecified kyphosis, cervical region: Secondary | ICD-10-CM | POA: Diagnosis not present

## 2019-05-26 DIAGNOSIS — I69341 Monoplegia of lower limb following cerebral infarction affecting right dominant side: Secondary | ICD-10-CM | POA: Diagnosis not present

## 2019-05-26 NOTE — Telephone Encounter (Signed)
Can increase Basaglar to 35 units qhs.

## 2019-05-26 NOTE — Telephone Encounter (Signed)
Cassandra Adams with [facility] called and said yesterday before breakfast her sugar was 345 and before lunch it was 303. Last night at bedtime it was 273. Please advise, call daughter @ [phone]

## 2019-05-26 NOTE — Patient Outreach (Signed)
Outreach call to pt for screening/  3rd attempt, no answer to telephone and no option to leave voicemail.  PLAN Close case in 3-4 business days if no response from pt  Julie Farmer RNC, BSN [facility] Community Care Coordinator [phone]   

## 2019-05-27 NOTE — Telephone Encounter (Signed)
Pt daughter notified. She will notify the home health aide.

## 2019-05-27 NOTE — Telephone Encounter (Signed)
Left VM for pt to call back.

## 2019-05-28 DIAGNOSIS — N183 Chronic kidney disease, stage 3 unspecified: Secondary | ICD-10-CM | POA: Diagnosis not present

## 2019-05-28 DIAGNOSIS — M48061 Spinal stenosis, lumbar region without neurogenic claudication: Secondary | ICD-10-CM | POA: Diagnosis not present

## 2019-05-28 DIAGNOSIS — M4804 Spinal stenosis, thoracic region: Secondary | ICD-10-CM | POA: Diagnosis not present

## 2019-05-28 DIAGNOSIS — E1122 Type 2 diabetes mellitus with diabetic chronic kidney disease: Secondary | ICD-10-CM | POA: Diagnosis not present

## 2019-05-28 DIAGNOSIS — E1165 Type 2 diabetes mellitus with hyperglycemia: Secondary | ICD-10-CM | POA: Diagnosis not present

## 2019-05-28 DIAGNOSIS — I69341 Monoplegia of lower limb following cerebral infarction affecting right dominant side: Secondary | ICD-10-CM | POA: Diagnosis not present

## 2019-05-28 DIAGNOSIS — I2510 Atherosclerotic heart disease of native coronary artery without angina pectoris: Secondary | ICD-10-CM | POA: Diagnosis not present

## 2019-05-28 DIAGNOSIS — E1142 Type 2 diabetes mellitus with diabetic polyneuropathy: Secondary | ICD-10-CM | POA: Diagnosis not present

## 2019-05-28 DIAGNOSIS — M40202 Unspecified kyphosis, cervical region: Secondary | ICD-10-CM | POA: Diagnosis not present

## 2019-05-28 DIAGNOSIS — I129 Hypertensive chronic kidney disease with stage 1 through stage 4 chronic kidney disease, or unspecified chronic kidney disease: Secondary | ICD-10-CM | POA: Diagnosis not present

## 2019-05-29 ENCOUNTER — Other Ambulatory Visit: Payer: Self-pay | Admitting: *Deleted

## 2019-05-29 NOTE — Patient Outreach (Signed)
Case closed due to unable to contact pt,  RN CM faxed case closure letter to primary MD.  Jacqlyn Larsen [facility], [place] Coordinator [phone]

## 2019-06-02 DIAGNOSIS — M79671 Pain in right foot: Secondary | ICD-10-CM | POA: Diagnosis not present

## 2019-06-02 DIAGNOSIS — L97529 Non-pressure chronic ulcer of other part of left foot with unspecified severity: Secondary | ICD-10-CM | POA: Diagnosis not present

## 2019-06-02 DIAGNOSIS — M79672 Pain in left foot: Secondary | ICD-10-CM | POA: Diagnosis not present

## 2019-06-02 DIAGNOSIS — L97522 Non-pressure chronic ulcer of other part of left foot with fat layer exposed: Secondary | ICD-10-CM | POA: Diagnosis not present

## 2019-06-02 DIAGNOSIS — Z1629 Resistance to other single specified antibiotic: Secondary | ICD-10-CM | POA: Diagnosis not present

## 2019-06-02 DIAGNOSIS — M25775 Osteophyte, left foot: Secondary | ICD-10-CM | POA: Diagnosis not present

## 2019-06-03 DIAGNOSIS — I69341 Monoplegia of lower limb following cerebral infarction affecting right dominant side: Secondary | ICD-10-CM | POA: Diagnosis not present

## 2019-06-03 DIAGNOSIS — I2510 Atherosclerotic heart disease of native coronary artery without angina pectoris: Secondary | ICD-10-CM | POA: Diagnosis not present

## 2019-06-03 DIAGNOSIS — M40202 Unspecified kyphosis, cervical region: Secondary | ICD-10-CM | POA: Diagnosis not present

## 2019-06-03 DIAGNOSIS — M48061 Spinal stenosis, lumbar region without neurogenic claudication: Secondary | ICD-10-CM | POA: Diagnosis not present

## 2019-06-03 DIAGNOSIS — M4804 Spinal stenosis, thoracic region: Secondary | ICD-10-CM | POA: Diagnosis not present

## 2019-06-03 DIAGNOSIS — E1142 Type 2 diabetes mellitus with diabetic polyneuropathy: Secondary | ICD-10-CM | POA: Diagnosis not present

## 2019-06-03 DIAGNOSIS — E1165 Type 2 diabetes mellitus with hyperglycemia: Secondary | ICD-10-CM | POA: Diagnosis not present

## 2019-06-03 DIAGNOSIS — N183 Chronic kidney disease, stage 3 unspecified: Secondary | ICD-10-CM | POA: Diagnosis not present

## 2019-06-03 DIAGNOSIS — E1122 Type 2 diabetes mellitus with diabetic chronic kidney disease: Secondary | ICD-10-CM | POA: Diagnosis not present

## 2019-06-03 DIAGNOSIS — I129 Hypertensive chronic kidney disease with stage 1 through stage 4 chronic kidney disease, or unspecified chronic kidney disease: Secondary | ICD-10-CM | POA: Diagnosis not present

## 2019-06-05 DIAGNOSIS — I69341 Monoplegia of lower limb following cerebral infarction affecting right dominant side: Secondary | ICD-10-CM | POA: Diagnosis not present

## 2019-06-05 DIAGNOSIS — I129 Hypertensive chronic kidney disease with stage 1 through stage 4 chronic kidney disease, or unspecified chronic kidney disease: Secondary | ICD-10-CM | POA: Diagnosis not present

## 2019-06-05 DIAGNOSIS — E1142 Type 2 diabetes mellitus with diabetic polyneuropathy: Secondary | ICD-10-CM | POA: Diagnosis not present

## 2019-06-05 DIAGNOSIS — M48061 Spinal stenosis, lumbar region without neurogenic claudication: Secondary | ICD-10-CM | POA: Diagnosis not present

## 2019-06-05 DIAGNOSIS — M40202 Unspecified kyphosis, cervical region: Secondary | ICD-10-CM | POA: Diagnosis not present

## 2019-06-05 DIAGNOSIS — I2510 Atherosclerotic heart disease of native coronary artery without angina pectoris: Secondary | ICD-10-CM | POA: Diagnosis not present

## 2019-06-05 DIAGNOSIS — E1165 Type 2 diabetes mellitus with hyperglycemia: Secondary | ICD-10-CM | POA: Diagnosis not present

## 2019-06-05 DIAGNOSIS — E1122 Type 2 diabetes mellitus with diabetic chronic kidney disease: Secondary | ICD-10-CM | POA: Diagnosis not present

## 2019-06-05 DIAGNOSIS — M4804 Spinal stenosis, thoracic region: Secondary | ICD-10-CM | POA: Diagnosis not present

## 2019-06-05 DIAGNOSIS — N183 Chronic kidney disease, stage 3 unspecified: Secondary | ICD-10-CM | POA: Diagnosis not present

## 2019-06-10 DIAGNOSIS — M4804 Spinal stenosis, thoracic region: Secondary | ICD-10-CM | POA: Diagnosis not present

## 2019-06-10 DIAGNOSIS — R69 Illness, unspecified: Secondary | ICD-10-CM | POA: Diagnosis not present

## 2019-06-10 DIAGNOSIS — E1142 Type 2 diabetes mellitus with diabetic polyneuropathy: Secondary | ICD-10-CM | POA: Diagnosis not present

## 2019-06-10 DIAGNOSIS — I129 Hypertensive chronic kidney disease with stage 1 through stage 4 chronic kidney disease, or unspecified chronic kidney disease: Secondary | ICD-10-CM | POA: Diagnosis not present

## 2019-06-10 DIAGNOSIS — M40202 Unspecified kyphosis, cervical region: Secondary | ICD-10-CM | POA: Diagnosis not present

## 2019-06-10 DIAGNOSIS — I69341 Monoplegia of lower limb following cerebral infarction affecting right dominant side: Secondary | ICD-10-CM | POA: Diagnosis not present

## 2019-06-10 DIAGNOSIS — I2510 Atherosclerotic heart disease of native coronary artery without angina pectoris: Secondary | ICD-10-CM | POA: Diagnosis not present

## 2019-06-10 DIAGNOSIS — E1165 Type 2 diabetes mellitus with hyperglycemia: Secondary | ICD-10-CM | POA: Diagnosis not present

## 2019-06-10 DIAGNOSIS — N183 Chronic kidney disease, stage 3 unspecified: Secondary | ICD-10-CM | POA: Diagnosis not present

## 2019-06-10 DIAGNOSIS — M48061 Spinal stenosis, lumbar region without neurogenic claudication: Secondary | ICD-10-CM | POA: Diagnosis not present

## 2019-06-10 DIAGNOSIS — E1122 Type 2 diabetes mellitus with diabetic chronic kidney disease: Secondary | ICD-10-CM | POA: Diagnosis not present

## 2019-06-12 ENCOUNTER — Other Ambulatory Visit: Payer: Self-pay | Admitting: "Endocrinology

## 2019-06-12 DIAGNOSIS — I129 Hypertensive chronic kidney disease with stage 1 through stage 4 chronic kidney disease, or unspecified chronic kidney disease: Secondary | ICD-10-CM | POA: Diagnosis not present

## 2019-06-12 DIAGNOSIS — I480 Paroxysmal atrial fibrillation: Secondary | ICD-10-CM | POA: Diagnosis not present

## 2019-06-12 DIAGNOSIS — M48061 Spinal stenosis, lumbar region without neurogenic claudication: Secondary | ICD-10-CM | POA: Diagnosis not present

## 2019-06-12 DIAGNOSIS — E1122 Type 2 diabetes mellitus with diabetic chronic kidney disease: Secondary | ICD-10-CM | POA: Diagnosis not present

## 2019-06-16 DIAGNOSIS — M48061 Spinal stenosis, lumbar region without neurogenic claudication: Secondary | ICD-10-CM | POA: Diagnosis not present

## 2019-06-16 DIAGNOSIS — I129 Hypertensive chronic kidney disease with stage 1 through stage 4 chronic kidney disease, or unspecified chronic kidney disease: Secondary | ICD-10-CM | POA: Diagnosis not present

## 2019-06-16 DIAGNOSIS — I2510 Atherosclerotic heart disease of native coronary artery without angina pectoris: Secondary | ICD-10-CM | POA: Diagnosis not present

## 2019-06-16 DIAGNOSIS — E1142 Type 2 diabetes mellitus with diabetic polyneuropathy: Secondary | ICD-10-CM | POA: Diagnosis not present

## 2019-06-16 DIAGNOSIS — E1122 Type 2 diabetes mellitus with diabetic chronic kidney disease: Secondary | ICD-10-CM | POA: Diagnosis not present

## 2019-06-16 DIAGNOSIS — M40202 Unspecified kyphosis, cervical region: Secondary | ICD-10-CM | POA: Diagnosis not present

## 2019-06-16 DIAGNOSIS — I69341 Monoplegia of lower limb following cerebral infarction affecting right dominant side: Secondary | ICD-10-CM | POA: Diagnosis not present

## 2019-06-16 DIAGNOSIS — N183 Chronic kidney disease, stage 3 unspecified: Secondary | ICD-10-CM | POA: Diagnosis not present

## 2019-06-16 DIAGNOSIS — E1165 Type 2 diabetes mellitus with hyperglycemia: Secondary | ICD-10-CM | POA: Diagnosis not present

## 2019-06-16 DIAGNOSIS — M4804 Spinal stenosis, thoracic region: Secondary | ICD-10-CM | POA: Diagnosis not present

## 2019-06-17 DIAGNOSIS — L97522 Non-pressure chronic ulcer of other part of left foot with fat layer exposed: Secondary | ICD-10-CM | POA: Diagnosis not present

## 2019-06-25 DIAGNOSIS — E1122 Type 2 diabetes mellitus with diabetic chronic kidney disease: Secondary | ICD-10-CM | POA: Diagnosis not present

## 2019-06-25 DIAGNOSIS — E1142 Type 2 diabetes mellitus with diabetic polyneuropathy: Secondary | ICD-10-CM | POA: Diagnosis not present

## 2019-06-25 DIAGNOSIS — E1165 Type 2 diabetes mellitus with hyperglycemia: Secondary | ICD-10-CM | POA: Diagnosis not present

## 2019-06-25 DIAGNOSIS — I69341 Monoplegia of lower limb following cerebral infarction affecting right dominant side: Secondary | ICD-10-CM | POA: Diagnosis not present

## 2019-06-25 DIAGNOSIS — M48061 Spinal stenosis, lumbar region without neurogenic claudication: Secondary | ICD-10-CM | POA: Diagnosis not present

## 2019-06-25 DIAGNOSIS — N183 Chronic kidney disease, stage 3 unspecified: Secondary | ICD-10-CM | POA: Diagnosis not present

## 2019-06-25 DIAGNOSIS — M4804 Spinal stenosis, thoracic region: Secondary | ICD-10-CM | POA: Diagnosis not present

## 2019-06-25 DIAGNOSIS — I129 Hypertensive chronic kidney disease with stage 1 through stage 4 chronic kidney disease, or unspecified chronic kidney disease: Secondary | ICD-10-CM | POA: Diagnosis not present

## 2019-06-25 DIAGNOSIS — I2510 Atherosclerotic heart disease of native coronary artery without angina pectoris: Secondary | ICD-10-CM | POA: Diagnosis not present

## 2019-06-25 DIAGNOSIS — M40202 Unspecified kyphosis, cervical region: Secondary | ICD-10-CM | POA: Diagnosis not present

## 2019-06-30 ENCOUNTER — Telehealth: Payer: Self-pay | Admitting: Internal Medicine

## 2019-06-30 NOTE — Telephone Encounter (Signed)
Patient is requesting her daughter attend upcoming appointment scheduled for 07/02/19 due to the daughter being her ride and caregiver.

## 2019-06-30 NOTE — Telephone Encounter (Signed)
Called patient and explained visitor policy at present time d/t COVID. Daughter can bring her to visit but will need to wait outside lobby or in vehicle and if patient needs assistance d/t mobility, a staff member can help with this

## 2019-07-02 ENCOUNTER — Other Ambulatory Visit: Payer: Self-pay

## 2019-07-02 ENCOUNTER — Ambulatory Visit (INDEPENDENT_AMBULATORY_CARE_PROVIDER_SITE_OTHER): Payer: Medicare HMO | Admitting: Internal Medicine

## 2019-07-02 ENCOUNTER — Encounter: Payer: Self-pay | Admitting: Internal Medicine

## 2019-07-02 VITALS — BP 208/98 | HR 86 | Ht 66.0 in | Wt 194.0 lb

## 2019-07-02 DIAGNOSIS — I480 Paroxysmal atrial fibrillation: Secondary | ICD-10-CM

## 2019-07-02 DIAGNOSIS — I2510 Atherosclerotic heart disease of native coronary artery without angina pectoris: Secondary | ICD-10-CM

## 2019-07-02 DIAGNOSIS — E785 Hyperlipidemia, unspecified: Secondary | ICD-10-CM | POA: Diagnosis not present

## 2019-07-02 DIAGNOSIS — I10 Essential (primary) hypertension: Secondary | ICD-10-CM | POA: Diagnosis not present

## 2019-07-02 DIAGNOSIS — I2583 Coronary atherosclerosis due to lipid rich plaque: Secondary | ICD-10-CM

## 2019-07-02 DIAGNOSIS — M71572 Other bursitis, not elsewhere classified, left ankle and foot: Secondary | ICD-10-CM | POA: Diagnosis not present

## 2019-07-02 NOTE — Patient Instructions (Signed)
Medication Instructions:  No changes *If you need a refill on your cardiac medications before your next appointment, please call your pharmacy*  Lab Work: None Ordered  Testing/Procedures: None Ordered  Follow-Up: At [facility], you and your health needs are our priority.  As part of our continuing mission to provide you with exceptional heart care, we have created designated Provider Care Teams.  These Care Teams include your primary Cardiologist (physician) and Advanced Practice Providers (APPs -  Physician Assistants and Nurse Practitioners) who all work together to provide you with the care you need, when you need it.  Your next appointment:   1 year(s)  The format for your next appointment:   In Person  Provider:   K. Mali Hilty, MD

## 2019-07-02 NOTE — Progress Notes (Signed)
07/02/2019 Cassandra Adams   Aug 16, 1945  [id number]  Primary Physicia [employer]mond [employer], MD Primary Cardiologist: Dr. Debara Pickett  CC: Routine follow-up  HPI:  The patient is a 74 year old African American female with a history of diabetes, hypertension, prior stroke but no prior history of CAD, who initially presented to [facility] on 01/03/2014 with a complaint of chest pain. Initial troponin was elevated at 15.7 and EKG was with nonspecific ST-T changes. She was placed on IV heparin and IV nitroglycerin and was transferred to [facility] for further treatment. Her chest pain resolved. She underwent a diagnostic left heart catheterization, perform by Dr. Gwenlyn Found which demonstrated occlusion of the midportion of the RCA, which was felt to be the infarct-related artery. However, she had grade 3 left right collaterals. She also has moderate LAD artery disease with 50% segmental stenosis in the proximal portion with a focal area of 60-70% stenosis. The left main and left circumflex were both normal. Left ventricular systolic function was normal with an estimated ejection fraction of 60%. Wall motion abnormalities were notable for sublte inferior basal hypokinesia. Given her lack of ongoing symptoms and preserved LV function with excellent collaterals, it was recommended to treat with medical therapy. It was felt that if she were to develop recurrent chest pain then she would be a candidate for PCI and stenting of her RCA. She left the Cath Lab in stable condition and had no post-cath complications and no recurrent chest pain. She was placed on dual antiplatelet therapy with aspirin plus Plavix. She was also placed on a beta blocker, an ACE inhibitor and statin. Also notable this admission, was a hemoglobin A1c level of 8.1. She was instructed to followup with her PCP for better management of her diabetes.  She was discharged home on 01/06/2014.  She presents to clinic today for post hospital  followup. She is accompanied by her daughter. She denies any recurrent anginal symptoms. She also denies dyspnea, syncope/near-syncope. Yesterday however, she did note feeling a sensation of increased warmth and mild dizziness, that was self-limiting. This lasted only a minute or 2. Again, she had no syncope/near-syncope. She denies any further recurrence. She has been fully compliant with her medications.  Cassandra Adams is seen in followup today. Her EKG demonstrates a sinus rhythm today. However I suspect that she continues to have PAF. Unfortunate she took herself off of Xarelto after experiencing some oral bleeding. She did not notify the office and has not been on any anticoagulation for several months. We discussed her higher than normal risk of stroke, based on a CHADSVASC score of 5. She reports only one episode of angina in January, however it was very short-lived and sharp pain. She took a nitroglycerin and the symptoms went away after 10 or 15 minutes, suggesting this may not eventually been angina.  I saw Cassandra Adams today back in the office. Overall she is feeling well. She denies any chest pain or shortness of breath. She's not had any further episodes of A. fib. She is noted to be mildly bradycardic today. Blood pressure has run somewhat high for a while.  02/18/2016  Cassandra Adams returns today for follow-up. She reports she's been having some episodes of what sounds like night sweats. She wakes up at night fairly soaked. These don't happen every night and is been no associated weight loss, change in appetite or other constitutional symptoms. She reports she is long past her menopause symptoms. She continues to have morning hypoglycemia with  blood sugars in the 40s. I previously asked to decrease her Lantus insulin. She says that her endocrinologist is aware of this and she has an appointment with him in September. She is not checking her sugars in the middle the night but I'm suspicious of  hypoglycemia. Other possible causes would include autonomic dysfunction, she does have gastroparesis and peripheral neuropathy. In addition she could be having other symptoms such as reflux or other things which cause vagal stimulation and diaphoresis. This is very atypical for coronary disease and there is no associated chest pain or worsening shortness of breath.  03/30/2016  Cassandra Adams was seen back today for follow-up. She reports her hot flashes have resolved. Unfortunately even though we decreased her insulin, she had some recurrent hypoglycemia and ended up in the emergency department. She is now having problems with hyperglycemia. 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 She denies any chest pain or worsening shortness of breath. She does have some chronic lower extremity edema which is likely from neuropathy. I've recommended bilateral knee-high 20-30 mmHg compression stockings.  11/24/2016  Cassandra Adams returns for follow-up. She reported December she had to take one nitroglycerin. She was very active that day and it seemed to resolve her symptoms fairly quickly. She's not required any more nitroglycerin. She is asking for refills of that today. She continues to have problems with hypoglycemia although does seem she has awareness. Recently she had a few episodes of spontaneous diaphoresis. This was not associated with any chest pain and testing of her blood sugar indicated hypoglycemia so I suspect that this is more hypoglycemia awareness. She reports some lower strandy swelling which we discussed in the past would be amenable to compression stockings. She is on daily Lasix. Blood pressure was initially elevated 158/62 of her came out a 134/60. She has managed to lose 10 pounds since I last saw her which I commended her on as well.  11/26/2017  Cassandra Adams returns today for routine follow-up.  Overall she is doing well.   She has not required any nitroglycerin since I last saw her.  She denies any bleeding problems on Eliquis.  She is out of some medications including atorvastatin and nearly out of Eliquis.  She also lost her nitroglycerin and needs a refill.  She reports reasonably good blood sugar control.  Her blood pressure has remained elevated.  Recently her PCP started her on losartan, however she ready was on lisinopril.  She is not on any additional blood pressure medications.  Blood pressure today was 169/82.  Given her history of coronary disease, she would be a good candidate for beta-blocker.  EKG shows sinus rhythm with PVCs in fact today and nonspecific T wave changes at 80.  07/03/2019  Mrs. Scorsone returns today for follow-up.  Over the past year she has done well.  She denies any chest pain or worsening shortness of breath.  Blood pressure was very elevated today at 208/98 however she was rushed to get in the office and was upset that her daughter could not come back with her.  I repeated her blood pressure eventually did come down to 132/68.  EKG shows sinus rhythm with some PACs today.  Recent labs from November showed total cholesterol 128, HDL 51, LDL 57 triglycerides 99.  She is working with Dr. Dorris Fetch regarding her diabetes and her hemoglobin A1c is still elevated 8.3.   Current Outpatient Medications  Medication Sig Dispense  Refill  . aspirin EC 81 MG tablet Take 81 mg by mouth daily.    [name] atorvastatin (LIPITOR) 80 MG tablet Take 1 tablet (80 mg total) by mouth every evening. 90 tablet 3  . carvedilol (COREG) 6.25 MG tablet Take 1 tablet (6.25 mg total) by mouth 2 (two) times daily. 60 tablet 1  . colesevelam (WELCHOL) 625 MG tablet Take 1,875 mg by mouth 2 (two) times daily with a meal. *May take 6 tablets once a day with meals*    . ELIQUIS 5 MG TABS tablet Take 1 tablet (5 mg total) by mouth 2 (two) times daily. 180 tablet 1  . furosemide (LASIX) 40 MG tablet Take 40 mg by mouth 2 (two) times  daily.     [name] glipiZIDE (GLUCOTROL XL) 5 MG 24 hr tablet TAKE 1 TABLET BY MOUTH EVERY DAY WITH BREAKFAST 90 tablet 1  . hydrALAZINE (APRESOLINE) 25 MG tablet Take 1 tablet (25 mg total) by mouth 3 (three) times daily. 120 tablet 11  . Insulin Glargine (BASAGLAR KWIKPEN) 100 UNIT/ML SOPN Inject 35 Units into the skin at bedtime.    . insulin lispro (HUMALOG) 100 UNIT/ML injection Inject 10 Units into the skin 3 (three) times daily before meals.     [name] lisinopril (ZESTRIL) 10 MG tablet Take 10 mg by mouth daily.     . nitroGLYCERIN (NITROSTAT) 0.4 MG SL tablet Place 1 tablet (0.4 mg total) under the tongue every 5 (five) minutes as needed for chest pain (up to 3 doses). 25 tablet 3  . nystatin cream (MYCOSTATIN) Apply 1 application topically 2 (two) times daily as needed for dry skin.     [name] tiZANidine (ZANAFLEX) 4 MG tablet Take 4 mg by mouth at bedtime.      No current facility-administered medications for this visit.    Allergies  Allergen Reactions  . Penicillins Hives    Has patient had a PCN reaction causing immediate rash, facial/tongue/throat swelling, SOB or lightheadedness with hypotension: no Has patient had a PCN reaction causing severe rash involving mucus membranes or skin necrosis: No  Has patient had a PCN reaction that required hospitalization: no Has patient had a PCN reaction occurring within the last 10 years: no If all of the above answers are "NO", then may proceed with Cephalosporin use.     Social History   Socioeconomic History  . Marital status: Divorced    Spouse name: Not on file  . Number of children: Not on file  . Years of education: Not on file  . Highest education level: Not on file  Occupational History  . Not on file  Tobacco Use  . Smoking status: Never Smoker  . Smokeless tobacco: Never Used  Substance and Sexual Activity  . Alcohol use: No  . Drug use: No  . Sexual activity: Not Currently    Birth control/protection: Post-menopausal  Other  Topics Concern  . Not on file  Social History Narrative  . Not on file   Social Determinants of Health   Financial Resource Strain:   . Difficulty of Paying Living Expenses: Not on file  Food Insecurity:   . Worried About Charity fundraiser in the Last Year: Not on file  . Ran Out of Food in the Last Year: Not on file  Transportation Needs:   . Lack of Transportation (Medical): Not on file  . Lack of Transportation (Non-Medical): Not on file  Physical Activity:   . Days of Exercise per Week:  Not on file  . Minutes of Exercise per Session: Not on file  Stress:   . Feeling of Stress : Not on file  Social Connections:   . Frequency of Communication with Friends and Family: Not on file  . Frequency of Social Gatherings with Friends and Family: Not on file  . Attends Religious Services: Not on file  . Active Member of Clubs or Organizations: Not on file  . Attends Archivist Meetings: Not on file  . Marital Status: Not on file  Intimate Partner Violence:   . Fear of Current or Ex-Partner: Not on file  . Emotionally Abused: Not on file  . Physically Abused: Not on file  . Sexually Abused: Not on file     Review of Systems: Pertinent items noted in HPI and remainder of comprehensive ROS otherwise negative.   Blood pressure (!) 208/98, pulse 86, height 5\' 6"  (1.676 m), weight 194 lb (88 kg), SpO2 98 %.  General appearance: alert, no distress and mildly obese Neck: no carotid bruit and no JVD Lungs: clear to auscultation bilaterally Heart: Regular bradycardia Abdomen: soft, non-tender; bowel sounds normal; no masses,  no organomegaly Extremities: edema trace pedal Pulses: 2+ and symmetric Skin: Skin color, texture, turgor normal. No rashes or lesions Neurologic: Grossly normal Psych: Pleasant  EKG Sinus rhythm with PACs at 86-personally reviewed  PROBLEM LIST: 1. CAD status post PCI to the RCA in 2015 2. PAF - CHADSVASC score of 5 on Eliquis and ASA for  CAD 3. Hypertension 4. Dyslipidemia 5. IDDM with neuropathy 6. Carotid artery stenosis/occlusion - mild 2016 7. Leg edema  ASSESSMENT AND PLAN:  Mrs. Arruda seems to be doing well.  Her blood pressure is controlled.  She is had no further chest pain.  There is no evidence for A. fib today and she remains on Eliquis without bleeding problems.  Her diabetes could be better controlled and she is working with an endocrinologist on this.  No changes to her medicines today.  Follow-up with me annually or sooner as necessary.  [name], MD, [facility], [place] Director of the [facility] Lipid Disorders &  Cardiovascular Risk Reduction Clinic Diplomate of the American Board of Clinical Lipidology Attending Cardiologist  Direct Dial: [phone]  Fax: [phone]  Website:  [email or web address] 07/02/2019 4:22 PM

## 2019-07-03 ENCOUNTER — Encounter: Payer: Self-pay | Admitting: Internal Medicine

## 2019-07-03 DIAGNOSIS — E1122 Type 2 diabetes mellitus with diabetic chronic kidney disease: Secondary | ICD-10-CM | POA: Diagnosis not present

## 2019-07-03 DIAGNOSIS — I69341 Monoplegia of lower limb following cerebral infarction affecting right dominant side: Secondary | ICD-10-CM | POA: Diagnosis not present

## 2019-07-03 DIAGNOSIS — E1142 Type 2 diabetes mellitus with diabetic polyneuropathy: Secondary | ICD-10-CM | POA: Diagnosis not present

## 2019-07-03 DIAGNOSIS — M40202 Unspecified kyphosis, cervical region: Secondary | ICD-10-CM | POA: Diagnosis not present

## 2019-07-03 DIAGNOSIS — N183 Chronic kidney disease, stage 3 unspecified: Secondary | ICD-10-CM | POA: Diagnosis not present

## 2019-07-03 DIAGNOSIS — E1165 Type 2 diabetes mellitus with hyperglycemia: Secondary | ICD-10-CM | POA: Diagnosis not present

## 2019-07-03 DIAGNOSIS — I129 Hypertensive chronic kidney disease with stage 1 through stage 4 chronic kidney disease, or unspecified chronic kidney disease: Secondary | ICD-10-CM | POA: Diagnosis not present

## 2019-07-03 DIAGNOSIS — M4804 Spinal stenosis, thoracic region: Secondary | ICD-10-CM | POA: Diagnosis not present

## 2019-07-03 DIAGNOSIS — M48061 Spinal stenosis, lumbar region without neurogenic claudication: Secondary | ICD-10-CM | POA: Diagnosis not present

## 2019-07-03 DIAGNOSIS — I2510 Atherosclerotic heart disease of native coronary artery without angina pectoris: Secondary | ICD-10-CM | POA: Diagnosis not present

## 2019-07-06 DIAGNOSIS — R69 Illness, unspecified: Secondary | ICD-10-CM | POA: Diagnosis not present

## 2019-07-07 DIAGNOSIS — M5415 Radiculopathy, thoracolumbar region: Secondary | ICD-10-CM | POA: Diagnosis not present

## 2019-07-07 DIAGNOSIS — E1142 Type 2 diabetes mellitus with diabetic polyneuropathy: Secondary | ICD-10-CM | POA: Diagnosis not present

## 2019-07-07 DIAGNOSIS — R2689 Other abnormalities of gait and mobility: Secondary | ICD-10-CM | POA: Diagnosis not present

## 2019-07-07 DIAGNOSIS — M5104 Intervertebral disc disorders with myelopathy, thoracic region: Secondary | ICD-10-CM | POA: Diagnosis not present

## 2019-07-15 ENCOUNTER — Ambulatory Visit (INDEPENDENT_AMBULATORY_CARE_PROVIDER_SITE_OTHER): Payer: Medicare HMO | Admitting: "Endocrinology

## 2019-07-15 ENCOUNTER — Other Ambulatory Visit: Payer: Self-pay

## 2019-07-15 ENCOUNTER — Encounter: Payer: Self-pay | Admitting: "Endocrinology

## 2019-07-15 VITALS — BP 160/85 | HR 87 | Ht 66.0 in | Wt 194.0 lb

## 2019-07-15 DIAGNOSIS — E1122 Type 2 diabetes mellitus with diabetic chronic kidney disease: Secondary | ICD-10-CM | POA: Diagnosis not present

## 2019-07-15 DIAGNOSIS — N183 Chronic kidney disease, stage 3 unspecified: Secondary | ICD-10-CM | POA: Diagnosis not present

## 2019-07-15 DIAGNOSIS — I10 Essential (primary) hypertension: Secondary | ICD-10-CM | POA: Diagnosis not present

## 2019-07-15 DIAGNOSIS — Z794 Long term (current) use of insulin: Secondary | ICD-10-CM

## 2019-07-15 LAB — POCT GLYCOSYLATED HEMOGLOBIN (HGB A1C): Hemoglobin A1C: 8.1 % — AB (ref 4.0–5.6)

## 2019-07-15 NOTE — Patient Instructions (Signed)

## 2019-07-15 NOTE — Progress Notes (Signed)
07/15/2019, 4:34 PM       Endocrinology follow-up note   Subjective:    Patient ID: Cassandra Adams, female    DOB: 14-Sep-1945.  Cassandra Adams is being seen in follow-up  for management of currently uncontrolled symptomatic diabetes requested by  Redmond School, MD.   Past Medical History:  Diagnosis Date  . Arthritis    "all over"  . CAD (coronary artery disease)    a. NSTEMI 12/2013 - occluded dominant RCA with L-R collaterals, moderate prox segmental LAD disease, for medical therapy initially, EF 60% with subtle inferobasal hypokinesia. Consider PCI for refractory CP.  [name] CKD (chronic kidney disease), stage III   . Hypertension   . Migraines    "weekly" (01/06/2014)  . Sickle cell trait ([place])   . TIA (transient ischemic attack) 1978  . Type II diabetes mellitus ([place])   . Vertigo    Past Surgical History:  Procedure Laterality Date  . APPENDECTOMY  March 2014   had appendix frozen  . CARDIAC CATHETERIZATION  "years ago" & 01/05/2014  . CARDIOVERSION N/A 03/17/2014   Procedure: CARDIOVERSION;  Surgeon: [name], MD;  Location: [facility] ENDOSCOPY;  Service: Cardiovascular;  Laterality: N/A;  . CATARACT EXTRACTION W/ INTRAOCULAR LENS  IMPLANT, BILATERAL Bilateral 04/2013-05/2013  . ENDARTERECTOMY Right 02/05/2017   Procedure: ENDARTERECTOMY CAROTID-RIGHT;  Surgeon: [name], MD;  Location: [facility] OR;  Service: Vascular;  Laterality: Right;  . ESOPHAGOGASTRODUODENOSCOPY  11/08/2007    Normal esophagus without evidence of Barrett, mass, erosion/ Normal stomach, duodenal bulb  . GASTRIC MOTILITY STUDY  11/13/2007   mildly delayed emptying subjectively, but normal  analysis-77% of tracer emptied at 2 hours  . JOINT REPLACEMENT    . LAPAROSCOPIC APPENDECTOMY N/A 09/15/2012   Procedure: APPENDECTOMY LAPAROSCOPIC;  Surgeon: [name], MD;  Location: [facility] ORS;  Service: General;  Laterality: N/A;   . LEFT HEART CATHETERIZATION WITH CORONARY ANGIOGRAM N/A 01/05/2014   Procedure: LEFT HEART CATHETERIZATION WITH CORONARY ANGIOGRAM;  Surgeon: [name], MD;  Location: [facility] CATH LAB;  Service: Cardiovascular;  Laterality: N/A;  . REPLACEMENT TOTAL KNEE Right 05-03-06  . TEE WITHOUT CARDIOVERSION N/A 03/17/2014   Procedure: TRANSESOPHAGEAL ECHOCARDIOGRAM (TEE);  Surgeon: [name], MD;  Location: [facility] ENDOSCOPY;  Service: Cardiovascular;  Laterality: N/A;  . TONSILLECTOMY  1972   Social History   Socioeconomic History  . Marital status: Divorced    Spouse name: Not on file  . Number of children: Not on file  . Years of education: Not on file  . Highest education level: Not on file  Occupational History  . Not on file  Tobacco Use  . Smoking status: Never Smoker  . Smokeless tobacco: Never Used  Substance and Sexual Activity  . Alcohol use: No  . Drug use: No  . Sexual activity: Not Currently    Birth control/protection: Post-menopausal  Other Topics Concern  . Not on file  Social History Narrative  . Not on file   Social Determinants of Health   Financial Resource Strain:   . Difficulty of Paying Living Expenses: Not on file  Food Insecurity:   . Worried About Charity fundraiser in the Last Year:  Not on file  . Ran Out of Food in the Last Year: Not on file  Transportation Needs:   . Lack of Transportation (Medical): Not on file  . Lack of Transportation (Non-Medical): Not on file  Physical Activity:   . Days of Exercise per Week: Not on file  . Minutes of Exercise per Session: Not on file  Stress:   . Feeling of Stress : Not on file  Social Connections:   . Frequency of Communication with Friends and Family: Not on file  . Frequency of Social Gatherings with Friends and Family: Not on file  . Attends Religious Services: Not on file  . Active Member of Clubs or Organizations: Not on file  . Attends Archivist Meetings: Not on file  . Marital  Status: Not on file   Outpatient Encounter Medications as of 07/15/2019  Medication Sig  . aspirin EC 81 MG tablet Take 81 mg by mouth daily.  [name] atorvastatin (LIPITOR) 80 MG tablet Take 1 tablet (80 mg total) by mouth every evening.  . carvedilol (COREG) 6.25 MG tablet Take 1 tablet (6.25 mg total) by mouth 2 (two) times daily.  . colesevelam (WELCHOL) 625 MG tablet Take 1,875 mg by mouth 2 (two) times daily with a meal. *May take 6 tablets once a day with meals*  . ELIQUIS 5 MG TABS tablet Take 1 tablet (5 mg total) by mouth 2 (two) times daily.  . furosemide (LASIX) 40 MG tablet Take 40 mg by mouth 2 (two) times daily.   [name] glipiZIDE (GLUCOTROL XL) 5 MG 24 hr tablet TAKE 1 TABLET BY MOUTH EVERY DAY WITH BREAKFAST  . hydrALAZINE (APRESOLINE) 25 MG tablet Take 1 tablet (25 mg total) by mouth 3 (three) times daily.  . Insulin Glargine (BASAGLAR KWIKPEN) 100 UNIT/ML SOPN Inject 35 Units into the skin at bedtime.  . insulin lispro (HUMALOG) 100 UNIT/ML injection Inject 5-8 Units into the skin 3 (three) times daily before meals.  [name] lisinopril (ZESTRIL) 10 MG tablet Take 10 mg by mouth daily.   . nitroGLYCERIN (NITROSTAT) 0.4 MG SL tablet Place 1 tablet (0.4 mg total) under the tongue every 5 (five) minutes as needed for chest pain (up to 3 doses).  . nystatin cream (MYCOSTATIN) Apply 1 application topically 2 (two) times daily as needed for dry skin.   [name] tiZANidine (ZANAFLEX) 4 MG tablet Take 4 mg by mouth at bedtime.    No facility-administered encounter medications on file as of 07/15/2019.    ALLERGIES: Allergies  Allergen Reactions  . Penicillins Hives    Has patient had a PCN reaction causing immediate rash, facial/tongue/throat swelling, SOB or lightheadedness with hypotension: no Has patient had a PCN reaction causing severe rash involving mucus membranes or skin necrosis: No  Has patient had a PCN reaction that required hospitalization: no Has patient had a PCN reaction occurring within  the last 10 years: no If all of the above answers are "NO", then may proceed with Cephalosporin use.     VACCINATION STATUS:  There is no immunization history on file for this patient.  Diabetes She presents for her follow-up diabetic visit. She has type 2 diabetes mellitus. Onset time: She was diagnosed at approximate age of 23 years. Her disease course has been improving. Pertinent negatives for hypoglycemia include no confusion, headaches, pallor or seizures. Pertinent negatives for diabetes include no chest pain, no fatigue, no foot paresthesias, no foot ulcerations, no polydipsia, no polyphagia and no polyuria. Symptoms are  improving. Diabetic complications include heart disease, nephropathy, PVD and retinopathy. Risk factors for coronary artery disease include family history, dyslipidemia, diabetes mellitus, hypertension, sedentary lifestyle and post-menopausal. Current diabetic treatment includes insulin injections and oral agent (monotherapy). She is following a generally unhealthy diet. When asked about meal planning, she reported none. She has not had a previous visit with a dietitian. She participates in exercise intermittently. Her home blood glucose trend is fluctuating minimally. Her breakfast blood glucose range is generally 140-180 mg/dl. Her lunch blood glucose range is generally 140-180 mg/dl. Her dinner blood glucose range is generally 140-180 mg/dl. Her bedtime blood glucose range is generally 180-200 mg/dl. Her overall blood glucose range is 180-200 mg/dl. (She reports near target glycemic profile, both fasting and postprandial.  Her recent A1c was 9.8%.   ) An ACE inhibitor/angiotensin II receptor blocker is being taken. Eye exam is current (She has retinopathy status post multiple laser therapies on bilateral eyes.).  Hyperlipidemia This is a chronic problem. The current episode started more than 1 year ago. The problem is controlled. Recent lipid tests were reviewed and are  normal. Exacerbating diseases include diabetes. Pertinent negatives include no chest pain, myalgias or shortness of breath. Current antihyperlipidemic treatment includes statins. Risk factors for coronary artery disease include diabetes mellitus, dyslipidemia, family history, hypertension, a sedentary lifestyle and post-menopausal.  Hypertension This is a chronic problem. The current episode started more than 1 year ago. The problem is uncontrolled. Pertinent negatives include no chest pain, headaches, palpitations or shortness of breath. Risk factors for coronary artery disease include dyslipidemia, diabetes mellitus, family history, sedentary lifestyle and post-menopausal state. Past treatments include ACE inhibitors. Hypertensive end-organ damage includes PVD and retinopathy.    Review of systems:  Constitutional: + Minimally fluctuating body weight, no fatigue, no subjective hyperthermia, no subjective hypothermia Eyes: no blurry vision, no xerophthalmia ENT: no sore throat, no nodules palpated in throat, no dysphagia/odynophagia, no hoarseness Cardiovascular: no Chest Pain, no Shortness of Breath, no palpitations, no leg swelling Respiratory: no cough, no SOB Gastrointestinal: no Nausea/Vomiting/Diarhhea Musculoskeletal: no muscle/joint aches Skin: no rashes Neurological: no tremors, no numbness, no tingling, no dizziness Psychiatric: no depression, no anxiety   Objective:    BP (!) 160/85   Pulse 87   Ht 5\' 6"  (1.676 m)   Wt 194 lb (88 kg)   BMI 31.31 kg/m   Wt Readings from Last 3 Encounters:  07/15/19 194 lb (88 kg)  07/02/19 194 lb (88 kg)  05/11/19 205 lb 4 oz (93.1 kg)    Physical Exam- Limited  Constitutional:  Body mass index is 31.31 kg/m. , not in acute distress, normal state of mind Eyes:  EOMI, no exophthalmos Neck: Supple Respiratory: Adequate breathing efforts Musculoskeletal: Left foot in a cast waiting for her surgery.   strength intact in all four  extremities, no gross restriction of joint movements Skin:  no rashes, no hyperemia Neurological: no tremor with outstretched hands.   CMP ( most recent) CMP     Component Value Date/Time   NA 140 05/10/2019 0705   NA 145 (H) 12/03/2017 0816   K 4.4 05/10/2019 0705   CL 109 05/10/2019 0705   CO2 22 05/10/2019 0705   GLUCOSE 119 (H) 05/10/2019 0705   BUN 41 (H) 05/10/2019 0705   BUN 18 12/03/2017 0816   CREATININE 1.33 (H) 05/10/2019 0705   CREATININE 1.40 (H) 07/01/2015 0926   CALCIUM 9.7 05/10/2019 0705   PROT 7.4 05/04/2019 1709   PROT 6.3 12/03/2017  0816   ALBUMIN 3.7 05/04/2019 1709   ALBUMIN 3.5 12/03/2017 0816   AST 33 05/04/2019 1709   ALT 22 05/04/2019 1709   ALKPHOS 85 05/04/2019 1709   BILITOT 0.5 05/04/2019 1709   BILITOT 0.2 12/03/2017 0816   GFRNONAA 39 (L) 05/10/2019 0705   GFRAA 46 (L) 05/10/2019 0705    Diabetic Labs (most recent): Lab Results  Component Value Date   HGBA1C 8.1 (A) 07/15/2019   HGBA1C 8.3 (H) 05/04/2019   HGBA1C 9.8 03/04/2019     Lipid Panel     Component Value Date/Time   CHOL 128 05/04/2019 2350   CHOL 110 12/03/2017 0816   TRIG 99 05/04/2019 2350   HDL 51 05/04/2019 2350   HDL 58 12/03/2017 0816   CHOLHDL 2.5 05/04/2019 2350   VLDL 20 05/04/2019 2350   LDLCALC 57 05/04/2019 2350   LDLCALC 42 12/03/2017 0816    Assessment & Plan:   1. DM type 2 causing vascular disease ([place])  - Cassandra Adams has currently uncontrolled symptomatic type 2 DM since  74 years of age. -She is accompanied by her daughter who is helping with her care.  She presents with improved, but still fluctuating glycemic profile.  Her point-of-care A1c is 8.1%, overall improving from 9.8%.    Also her daughter reporting near target glycemic profile on adjusted basal/bolus insulin.  This is despite her recent A1c of 9.8%.     -her diabetes is complicated by coronary artery disease, carotid  atherosclerosis, renal insufficiency, retinopathy,  peripheral neuropathy and Cassandra Adams remains at a high risk for more acute and chronic complications which include CAD, CVA, CKD, retinopathy, and neuropathy. These are all discussed in detail with the patient.  - I have counseled her on diet management and weight loss, by adopting a carbohydrate restricted/protein rich diet.  - she  admits there is a room for improvement in her diet and drink choices. -  Suggestion is made for her to avoid simple carbohydrates  from her diet including Cakes, Sweet Desserts / Pastries, Ice Cream, Soda (diet and regular), Sweet Tea, Candies, Chips, Cookies, Sweet Pastries,  Store Bought Juices, Alcohol in Excess of  1-2 drinks a day, Artificial Sweeteners, Coffee Creamer, and "Sugar-free" Products. This will help patient to have stable blood glucose profile and potentially avoid unintended weight gain.   - I encouraged her to switch to  unprocessed or minimally processed complex starch and increased protein intake (animal or plant source), fruits, and vegetables.  - she is advised to stick to a routine mealtimes to eat 3 meals  a day and avoid unnecessary snacks ( to snack only to correct hypoglycemia).   - she is scheduled with Jearld Fenton, RDN, CDE for individualized diabetes education.  - I have approached her with the following individualized plan to manage diabetes and patient agrees:   -Based on her glycemic response, she will continue to need intensive treatment with readjusted basal/bolus insulin in order for her to achieve and maintain control of diabetes to target.   - Her accompanying daughter is offering to help her.   -Priority #1 in the care of her diabetes will be to avoid hypoglycemia. She is advised to continue Basaglar 35 units nightly, continue Humalog 5-8  units 3 times daily AC for pre-meal blood glucose readings above 70 mg/dl   Associated with strict monitoring of blood glucose 4 times daily--before meals and at bedtime.   -  Patient is warned not to take  insulin without proper monitoring per orders. -She will continue to benefit from low-dose glipizide therapy.  She is advised to continue glipizide 5 mg XL p.o. daily at breakfast.   -Patient is encouraged to call clinic for blood glucose levels less than 70 or above 300 mg /dl.  -Patient is not a candidate for metformin,SGLT2 inhibitors due to CKD. - she will be considered for incretin therapy as appropriate next visit. - Patient specific target  A1c;  LDL, HDL, Triglycerides, and  were discussed in detail.  2) BP/HTN: Her blood pressure is not controlled to target.   She is advised to resume and continue lisinopril 20 mg p.o. daily at breakfast.    3) Lipids/HPL: Her recent lipid panel showed controlled LDL at 44.  She is advised to continue atorvastatin 80 mg p.o. nightly.   Side effects and precautions discussed with her.    4)  Weight/Diet: CDE Consult will be initiated , exercise, and detailed carbohydrates information provided.  5) Chronic Care/Health Maintenance:  -she  is on ACEI/ARB and Statin medications and  is encouraged to continue to follow up with Ophthalmology, Dentist,  Podiatrist at least yearly or according to recommendations, and advised to  stay away from smoking. I have recommended yearly flu vaccine and pneumonia vaccination at least every 5 years; moderate intensity exercise for up to 150 minutes weekly; and  sleep for at least 7 hours a day.  - I advised patient to maintain close follow up with Redmond School, MD for primary care needs.  - Time spent on this patient care encounter:  35 min, of which > 50% was spent in  counseling and the rest reviewing her blood glucose logs , discussing her hypoglycemia and hyperglycemia episodes, reviewing her current and  previous labs / studies  ( including abstraction from other facilities) and medications  doses and developing a  long term treatment plan and documenting her care.   Please refer to  Patient Instructions for Blood Glucose Monitoring and Insulin/Medications Dosing Guide"  in media tab for additional information. Please  also refer to " Patient Self Inventory" in the Media  tab for reviewed elements of pertinent patient history.  Cassandra Adams participated in the discussions, expressed understanding, and voiced agreement with the above plans.  All questions were answered to her satisfaction. she is encouraged to contact clinic should she have any questions or concerns prior to her return visit.    Follow up plan: - Return in about 3 months (around 10/13/2019) for Bring Meter and Logs- A1c in Office.  Glade Lloyd, MD [facility] [address] [place], [place] [address] Phone: [phone]  Fax: [phone]    07/15/2019, 4:34 PM  This note was partially dictated with voice recognition software. Similar sounding words can be transcribed inadequately or may not  be corrected upon review.

## 2019-07-17 DIAGNOSIS — M25562 Pain in left knee: Secondary | ICD-10-CM | POA: Insufficient documentation

## 2019-07-22 DIAGNOSIS — M25562 Pain in left knee: Secondary | ICD-10-CM | POA: Diagnosis not present

## 2019-07-22 DIAGNOSIS — M1712 Unilateral primary osteoarthritis, left knee: Secondary | ICD-10-CM | POA: Diagnosis not present

## 2019-07-30 DIAGNOSIS — M71572 Other bursitis, not elsewhere classified, left ankle and foot: Secondary | ICD-10-CM | POA: Diagnosis not present

## 2019-08-01 DIAGNOSIS — R69 Illness, unspecified: Secondary | ICD-10-CM | POA: Diagnosis not present

## 2019-08-11 ENCOUNTER — Ambulatory Visit: Payer: Medicare HMO | Attending: Internal Medicine

## 2019-08-15 ENCOUNTER — Other Ambulatory Visit: Payer: Self-pay | Admitting: Internal Medicine

## 2019-08-15 NOTE — Telephone Encounter (Signed)
73 F 88 kg SCr 1.33 (11/20), LOV Hilty 1/21

## 2019-08-20 DIAGNOSIS — Z23 Encounter for immunization: Secondary | ICD-10-CM | POA: Diagnosis not present

## 2019-08-26 DIAGNOSIS — R69 Illness, unspecified: Secondary | ICD-10-CM | POA: Diagnosis not present

## 2019-09-20 DIAGNOSIS — R69 Illness, unspecified: Secondary | ICD-10-CM | POA: Diagnosis not present

## 2019-10-01 DIAGNOSIS — E1140 Type 2 diabetes mellitus with diabetic neuropathy, unspecified: Secondary | ICD-10-CM | POA: Diagnosis not present

## 2019-10-01 DIAGNOSIS — Z1389 Encounter for screening for other disorder: Secondary | ICD-10-CM | POA: Diagnosis not present

## 2019-10-01 DIAGNOSIS — E119 Type 2 diabetes mellitus without complications: Secondary | ICD-10-CM | POA: Diagnosis not present

## 2019-10-01 DIAGNOSIS — Z0001 Encounter for general adult medical examination with abnormal findings: Secondary | ICD-10-CM | POA: Diagnosis not present

## 2019-10-01 DIAGNOSIS — Z79899 Other long term (current) drug therapy: Secondary | ICD-10-CM | POA: Diagnosis not present

## 2019-10-01 DIAGNOSIS — M25775 Osteophyte, left foot: Secondary | ICD-10-CM | POA: Diagnosis not present

## 2019-10-01 DIAGNOSIS — M71572 Other bursitis, not elsewhere classified, left ankle and foot: Secondary | ICD-10-CM | POA: Diagnosis not present

## 2019-10-01 DIAGNOSIS — I10 Essential (primary) hypertension: Secondary | ICD-10-CM | POA: Diagnosis not present

## 2019-10-01 DIAGNOSIS — Z01812 Encounter for preprocedural laboratory examination: Secondary | ICD-10-CM | POA: Diagnosis not present

## 2019-10-01 DIAGNOSIS — E1151 Type 2 diabetes mellitus with diabetic peripheral angiopathy without gangrene: Secondary | ICD-10-CM | POA: Diagnosis not present

## 2019-10-01 DIAGNOSIS — Z6831 Body mass index (BMI) 31.0-31.9, adult: Secondary | ICD-10-CM | POA: Diagnosis not present

## 2019-10-01 DIAGNOSIS — M1991 Primary osteoarthritis, unspecified site: Secondary | ICD-10-CM | POA: Diagnosis not present

## 2019-10-01 DIAGNOSIS — I7389 Other specified peripheral vascular diseases: Secondary | ICD-10-CM | POA: Diagnosis not present

## 2019-10-07 DIAGNOSIS — M898X7 Other specified disorders of bone, ankle and foot: Secondary | ICD-10-CM | POA: Diagnosis not present

## 2019-10-07 DIAGNOSIS — M25775 Osteophyte, left foot: Secondary | ICD-10-CM | POA: Diagnosis not present

## 2019-10-07 DIAGNOSIS — I10 Essential (primary) hypertension: Secondary | ICD-10-CM | POA: Diagnosis not present

## 2019-10-10 DIAGNOSIS — M12272 Villonodular synovitis (pigmented), left ankle and foot: Secondary | ICD-10-CM | POA: Diagnosis not present

## 2019-10-13 ENCOUNTER — Ambulatory Visit: Payer: Medicare HMO | Admitting: "Endocrinology

## 2019-10-13 ENCOUNTER — Other Ambulatory Visit: Payer: Self-pay

## 2019-10-13 ENCOUNTER — Encounter: Payer: Self-pay | Admitting: "Endocrinology

## 2019-10-13 VITALS — BP 175/73 | HR 66 | Ht 66.0 in | Wt 198.0 lb

## 2019-10-13 DIAGNOSIS — Z794 Long term (current) use of insulin: Secondary | ICD-10-CM | POA: Diagnosis not present

## 2019-10-13 DIAGNOSIS — I10 Essential (primary) hypertension: Secondary | ICD-10-CM | POA: Diagnosis not present

## 2019-10-13 DIAGNOSIS — N183 Chronic kidney disease, stage 3 unspecified: Secondary | ICD-10-CM

## 2019-10-13 DIAGNOSIS — E1122 Type 2 diabetes mellitus with diabetic chronic kidney disease: Secondary | ICD-10-CM | POA: Diagnosis not present

## 2019-10-13 DIAGNOSIS — E119 Type 2 diabetes mellitus without complications: Secondary | ICD-10-CM | POA: Diagnosis not present

## 2019-10-13 LAB — POCT GLYCOSYLATED HEMOGLOBIN (HGB A1C): Hemoglobin A1C: 8.3 % — AB (ref 4.0–5.6)

## 2019-10-13 NOTE — Progress Notes (Signed)
10/13/2019, 5:36 PM     Endocrinology follow-up note   Subjective:    Patient ID: Cassandra Adams, female    DOB: Sep 12, 1945.  Cassandra Adams is being seen in follow-up  for management of currently uncontrolled symptomatic diabetes requested by  Redmond School, MD.   Past Medical History:  Diagnosis Date  . Arthritis    "all over"  . CAD (coronary artery disease)    a. NSTEMI 12/2013 - occluded dominant RCA with L-R collaterals, moderate prox segmental LAD disease, for medical therapy initially, EF 60% with subtle inferobasal hypokinesia. Consider PCI for refractory CP.  [name] CKD (chronic kidney disease), stage III   . Hypertension   . Migraines    "weekly" (01/06/2014)  . Sickle cell trait ([place])   . TIA (transient ischemic attack) 1978  . Type II diabetes mellitus ([place])   . Vertigo    Past Surgical History:  Procedure Laterality Date  . APPENDECTOMY  March 2014   had appendix frozen  . CARDIAC CATHETERIZATION  "years ago" & 01/05/2014  . CARDIOVERSION N/A 03/17/2014   Procedure: CARDIOVERSION;  Surgeon: [name], MD;  Location: [facility] ENDOSCOPY;  Service: Cardiovascular;  Laterality: N/A;  . CATARACT EXTRACTION W/ INTRAOCULAR LENS  IMPLANT, BILATERAL Bilateral 04/2013-05/2013  . ENDARTERECTOMY Right 02/05/2017   Procedure: ENDARTERECTOMY CAROTID-RIGHT;  Surgeon: [name], MD;  Location: [facility] OR;  Service: Vascular;  Laterality: Right;  . ESOPHAGOGASTRODUODENOSCOPY  11/08/2007    Normal esophagus without evidence of Barrett, mass, erosion/ Normal stomach, duodenal bulb  . GASTRIC MOTILITY STUDY  11/13/2007   mildly delayed emptying subjectively, but normal  analysis-77% of tracer emptied at 2 hours  . JOINT REPLACEMENT    . LAPAROSCOPIC APPENDECTOMY N/A 09/15/2012   Procedure: APPENDECTOMY LAPAROSCOPIC;  Surgeon: [name], MD;  Location: [facility] ORS;  Service: General;  Laterality: N/A;  .  LEFT HEART CATHETERIZATION WITH CORONARY ANGIOGRAM N/A 01/05/2014   Procedure: LEFT HEART CATHETERIZATION WITH CORONARY ANGIOGRAM;  Surgeon: [name], MD;  Location: [facility] CATH LAB;  Service: Cardiovascular;  Laterality: N/A;  . REPLACEMENT TOTAL KNEE Right 05-03-06  . TEE WITHOUT CARDIOVERSION N/A 03/17/2014   Procedure: TRANSESOPHAGEAL ECHOCARDIOGRAM (TEE);  Surgeon: [name], MD;  Location: [facility] ENDOSCOPY;  Service: Cardiovascular;  Laterality: N/A;  . TONSILLECTOMY  1972   Social History   Socioeconomic History  . Marital status: Divorced    Spouse name: Not on file  . Number of children: Not on file  . Years of education: Not on file  . Highest education level: Not on file  Occupational History  . Not on file  Tobacco Use  . Smoking status: Never Smoker  . Smokeless tobacco: Never Used  Substance and Sexual Activity  . Alcohol use: No  . Drug use: No  . Sexual activity: Not Currently    Birth control/protection: Post-menopausal  Other Topics Concern  . Not on file  Social History Narrative  . Not on file   Social Determinants of Health   Financial Resource Strain:   . Difficulty of Paying Living Expenses:   Food Insecurity:   . Worried About Charity fundraiser in the Last Year:   . Ran  Out of Food in the Last Year:   Transportation Needs:   . Lack of Transportation (Medical):   [name] Lack of Transportation (Non-Medical):   Physical Activity:   . Days of Exercise per Week:   . Minutes of Exercise per Session:   Stress:   . Feeling of Stress :   Social Connections:   . Frequency of Communication with Friends and Family:   . Frequency of Social Gatherings with Friends and Family:   . Attends Religious Services:   . Active Member of Clubs or Organizations:   . Attends Archivist Meetings:   [name] Marital Status:    Outpatient Encounter Medications as of 10/13/2019  Medication Sig  . aspirin EC 81 MG tablet Take 81 mg by mouth daily.  [name] atorvastatin  (LIPITOR) 80 MG tablet Take 1 tablet (80 mg total) by mouth every evening.  . carvedilol (COREG) 6.25 MG tablet Take 1 tablet (6.25 mg total) by mouth 2 (two) times daily. (Patient taking differently: Take 3.125 mg by mouth 2 (two) times daily. )  . colesevelam (WELCHOL) 625 MG tablet Take 1,875 mg by mouth 2 (two) times daily with a meal. *May take 6 tablets once a day with meals*  . ELIQUIS 5 MG TABS tablet TAKE 1 TABLET BY MOUTH TWICE A DAY  . furosemide (LASIX) 40 MG tablet Take 40 mg by mouth 2 (two) times daily.   [name] glipiZIDE (GLUCOTROL XL) 5 MG 24 hr tablet TAKE 1 TABLET BY MOUTH EVERY DAY WITH BREAKFAST  . hydrALAZINE (APRESOLINE) 25 MG tablet Take 1 tablet (25 mg total) by mouth 3 (three) times daily.  . Insulin Glargine (BASAGLAR KWIKPEN) 100 UNIT/ML SOPN Inject 30 Units into the skin at bedtime.  . insulin lispro (HUMALOG) 100 UNIT/ML injection Inject 5-8 Units into the skin 3 (three) times daily before meals.  [name] lisinopril (ZESTRIL) 10 MG tablet Take 10 mg by mouth daily.   . nitroGLYCERIN (NITROSTAT) 0.4 MG SL tablet Place 1 tablet (0.4 mg total) under the tongue every 5 (five) minutes as needed for chest pain (up to 3 doses).  . [DISCONTINUED] nystatin cream (MYCOSTATIN) Apply 1 application topically 2 (two) times daily as needed for dry skin.   . [DISCONTINUED] tiZANidine (ZANAFLEX) 4 MG tablet Take 4 mg by mouth at bedtime.    No facility-administered encounter medications on file as of 10/13/2019.    ALLERGIES: Allergies  Allergen Reactions  . Penicillins Hives    Has patient had a PCN reaction causing immediate rash, facial/tongue/throat swelling, SOB or lightheadedness with hypotension: no Has patient had a PCN reaction causing severe rash involving mucus membranes or skin necrosis: No  Has patient had a PCN reaction that required hospitalization: no Has patient had a PCN reaction occurring within the last 10 years: no If all of the above answers are "NO", then may proceed  with Cephalosporin use.     VACCINATION STATUS:  There is no immunization history on file for this patient.  Diabetes She presents for her follow-up diabetic visit. She has type 2 diabetes mellitus. Onset time: She was diagnosed at approximate age of 41 years. Her disease course has been fluctuating. Pertinent negatives for hypoglycemia include no confusion, headaches, pallor or seizures. Pertinent negatives for diabetes include no chest pain, no fatigue, no foot paresthesias, no foot ulcerations, no polydipsia, no polyphagia and no polyuria. Symptoms are improving. Diabetic complications include heart disease, nephropathy, PVD and retinopathy. Risk factors for coronary artery disease include family history,  dyslipidemia, diabetes mellitus, hypertension, sedentary lifestyle and post-menopausal. Current diabetic treatment includes insulin injections and oral agent (monotherapy). Her weight is fluctuating minimally. She is following a generally unhealthy diet. When asked about meal planning, she reported none. She has not had a previous visit with a dietitian. She participates in exercise intermittently. Her home blood glucose trend is fluctuating minimally. Her breakfast blood glucose range is generally 180-200 mg/dl. Her lunch blood glucose range is generally 180-200 mg/dl. Her dinner blood glucose range is generally 180-200 mg/dl. Her bedtime blood glucose range is generally 180-200 mg/dl. Her overall blood glucose range is 180-200 mg/dl. (She is accompanied by her daughter to the clinic today.  Her logs show that she has significantly fluctuating glycemic profile.  Her point-of-care A1c is 8.3%, although unchanged from last visit, overall improving from 9.8%.  She does have rare, random mild hypoglycemic episodes. ) An ACE inhibitor/angiotensin II receptor blocker is being taken. Eye exam is current (She has retinopathy status post multiple laser therapies on bilateral eyes.).  Hyperlipidemia This is a  chronic problem. The current episode started more than 1 year ago. The problem is controlled. Recent lipid tests were reviewed and are normal. Exacerbating diseases include diabetes. Pertinent negatives include no chest pain, myalgias or shortness of breath. Current antihyperlipidemic treatment includes statins. Risk factors for coronary artery disease include diabetes mellitus, dyslipidemia, family history, hypertension, a sedentary lifestyle and post-menopausal.  Hypertension This is a chronic problem. The current episode started more than 1 year ago. The problem is uncontrolled. Pertinent negatives include no chest pain, headaches, palpitations or shortness of breath. Risk factors for coronary artery disease include dyslipidemia, diabetes mellitus, family history, sedentary lifestyle and post-menopausal state. Past treatments include ACE inhibitors. Hypertensive end-organ damage includes PVD and retinopathy.    Review of systems  Constitutional: + Minimally fluctuating body weight,  current  Body mass index is 31.96 kg/m. , no fatigue, no subjective hyperthermia, no subjective hypothermia Eyes: no blurry vision, no xerophthalmia ENT: no sore throat, no nodules palpated in throat, no dysphagia/odynophagia, no hoarseness Cardiovascular: no Chest Pain, no Shortness of Breath, no palpitations, no leg swelling Respiratory: no cough, no shortness of breath Gastrointestinal: no Nausea/Vomiting/Diarhhea Musculoskeletal: no muscle/joint aches Skin: no rashes, no hyperemia Neurological: no tremors, no numbness, no tingling, no dizziness Psychiatric: no depression, no anxiety   Objective:    BP (!) 175/73   Pulse 66   Ht 5\' 6"  (1.676 m)   Wt 198 lb (89.8 kg)   BMI 31.96 kg/m   Wt Readings from Last 3 Encounters:  10/13/19 198 lb (89.8 kg)  07/15/19 194 lb (88 kg)  07/02/19 194 lb (88 kg)      Physical Exam- Limited  Constitutional:  Body mass index is 31.96 kg/m. , not in acute  distress, normal state of mind Eyes:  EOMI, no exophthalmos Neck: Supple Thyroid: No gross goiter Respiratory: Adequate breathing efforts Musculoskeletal: no gross deformities, strength intact in all four extremities, no gross restriction of joint movements Skin:  no rashes, no hyperemia Neurological: no tremor with outstretched hands,    CMP     Component Value Date/Time   NA 140 05/10/2019 0705   NA 145 (H) 12/03/2017 0816   K 4.4 05/10/2019 0705   CL 109 05/10/2019 0705   CO2 22 05/10/2019 0705   GLUCOSE 119 (H) 05/10/2019 0705   BUN 41 (H) 05/10/2019 0705   BUN 18 12/03/2017 0816   CREATININE 1.33 (H) 05/10/2019 0705   CREATININE 1.40 (  H) 07/01/2015 0926   CALCIUM 9.7 05/10/2019 0705   PROT 7.4 05/04/2019 1709   PROT 6.3 12/03/2017 0816   ALBUMIN 3.7 05/04/2019 1709   ALBUMIN 3.5 12/03/2017 0816   AST 33 05/04/2019 1709   ALT 22 05/04/2019 1709   ALKPHOS 85 05/04/2019 1709   BILITOT 0.5 05/04/2019 1709   BILITOT 0.2 12/03/2017 0816   GFRNONAA 39 (L) 05/10/2019 0705   GFRAA 46 (L) 05/10/2019 0705    Diabetic Labs (most recent): Lab Results  Component Value Date   HGBA1C 8.3 (A) 10/13/2019   HGBA1C 8.1 (A) 07/15/2019   HGBA1C 8.3 (H) 05/04/2019     Lipid Panel     Component Value Date/Time   CHOL 128 05/04/2019 2350   CHOL 110 12/03/2017 0816   TRIG 99 05/04/2019 2350   HDL 51 05/04/2019 2350   HDL 58 12/03/2017 0816   CHOLHDL 2.5 05/04/2019 2350   VLDL 20 05/04/2019 2350   LDLCALC 57 05/04/2019 2350   LDLCALC 42 12/03/2017 0816    Assessment & Plan:   1. DM type 2 causing vascular disease ([place])  - Cassandra Adams has currently uncontrolled symptomatic type 2 DM since  74 years of age. -She is accompanied by her daughter who is helping with her care.  She presents with fluctuating glycemic profile, point-of-care A1c is 8.3%, overall improving from 9.8%.    -her diabetes is complicated by coronary artery disease, carotid  atherosclerosis, renal  insufficiency, retinopathy, peripheral neuropathy and Cassandra Adams remains at a high risk for more acute and chronic complications which include CAD, CVA, CKD, retinopathy, and neuropathy. These are all discussed in detail with the patient.  - I have counseled her on diet management and weight loss, by adopting a carbohydrate restricted/protein rich diet. - she  admits there is a room for improvement in her diet and drink choices. -  Suggestion is made for her to avoid simple carbohydrates  from her diet including Cakes, Sweet Desserts / Pastries, Ice Cream, Soda (diet and regular), Sweet Tea, Candies, Chips, Cookies, Sweet Pastries,  Store Bought Juices, Alcohol in Excess of  1-2 drinks a day, Artificial Sweeteners, Coffee Creamer, and "Sugar-free" Products. This will help patient to have stable blood glucose profile and potentially avoid unintended weight gain.  - I encouraged her to switch to  unprocessed or minimally processed complex starch and increased protein intake (animal or plant source), fruits, and vegetables.  - she is advised to stick to a routine mealtimes to eat 3 meals  a day and avoid unnecessary snacks ( to snack only to correct hypoglycemia).   - she is scheduled with Jearld Fenton, RDN, CDE for individualized diabetes education.  - I have approached her with the following individualized plan to manage diabetes and patient agrees:   -Based on her glycemic response, she will continue to need intensive treatment with readjusted basal/bolus insulin in order for her to achieve and maintain control of diabetes to target.   - Her accompanying daughter is offering to help her.   -Priority #1 in the care of her diabetes will be to avoid hypoglycemia. She is advised to lower her Basaglar to 30 units nightly, continue Humalog  5-8  units 3 times daily AC for pre-meal blood glucose readings above 70 mg/dl  associated with strict monitoring of blood glucose 4 times daily--before  meals and at bedtime.   - Patient is warned not to take insulin without proper monitoring per orders. -She will continue  to benefit from low-dose glipizide therapy.  She is advised to continue glipizide 5 mg XL p.o. daily at breakfast.   -Patient is encouraged to call clinic for blood glucose levels less than 70 or above 300 mg /dl.  -Patient is not a candidate for metformin,SGLT2 inhibitors due to CKD. - she will be considered for incretin therapy as appropriate next visit. - Patient specific target  A1c;  LDL, HDL, Triglycerides, and  were discussed in detail.  2) BP/HTN: Her blood pressure is not controlled to target.   She is advised to continue lisinopril 20 mg p.o. daily at breakfast.      3) Lipids/HPL: Her recent lipid panel showed controlled LDL at 44.  She is advised to continue atorvastatin 80 mg p.o. nightly.    Side effects and precautions discussed with her.    4)  Weight/Diet: Her current BMI is 31.9.  She is a candidate for modest weight loss.  CDE Consult will be initiated , exercise, and detailed carbohydrates information provided.  5) Chronic Care/Health Maintenance:  -she  is on ACEI/ARB and Statin medications and  is encouraged to continue to follow up with Ophthalmology, Dentist,  Podiatrist at least yearly or according to recommendations, and advised to  stay away from smoking. I have recommended yearly flu vaccine and pneumonia vaccination at least every 5 years; moderate intensity exercise for up to 150 minutes weekly; and  sleep for at least 7 hours a day.  - I advised patient to maintain close follow up with Redmond School, MD for primary care needs.  - Time spent on this patient care encounter:  35 min, of which > 50% was spent in  counseling and the rest reviewing her blood glucose logs , discussing her hypoglycemia and hyperglycemia episodes, reviewing her current and  previous labs / studies  ( including abstraction from other facilities) and medications  doses  and developing a  long term treatment plan and documenting her care.   Please refer to Patient Instructions for Blood Glucose Monitoring and Insulin/Medications Dosing Guide"  in media tab for additional information. Please  also refer to " Patient Self Inventory" in the Media  tab for reviewed elements of pertinent patient history.  Cassandra Adams participated in the discussions, expressed understanding, and voiced agreement with the above plans.  All questions were answered to her satisfaction. she is encouraged to contact clinic should she have any questions or concerns prior to her return visit.  Follow up plan: - Return in about 3 months (around 01/12/2020) for Bring Meter and Logs- A1c in Office, Follow up with Pre-visit Labs.  Glade Lloyd, MD [facility] [address] [place], [place] [address] Phone: [phone]  Fax: [phone]    10/13/2019, 5:36 PM  This note was partially dictated with voice recognition software. Similar sounding words can be transcribed inadequately or may not  be corrected upon review.

## 2019-10-13 NOTE — Patient Instructions (Signed)

## 2019-10-14 DIAGNOSIS — R69 Illness, unspecified: Secondary | ICD-10-CM | POA: Diagnosis not present

## 2019-10-22 DIAGNOSIS — D485 Neoplasm of uncertain behavior of skin: Secondary | ICD-10-CM | POA: Diagnosis not present

## 2019-10-22 DIAGNOSIS — M12272 Villonodular synovitis (pigmented), left ankle and foot: Secondary | ICD-10-CM | POA: Diagnosis not present

## 2019-10-24 DIAGNOSIS — I2510 Atherosclerotic heart disease of native coronary artery without angina pectoris: Secondary | ICD-10-CM | POA: Diagnosis not present

## 2019-10-24 DIAGNOSIS — E1122 Type 2 diabetes mellitus with diabetic chronic kidney disease: Secondary | ICD-10-CM | POA: Diagnosis not present

## 2019-10-24 DIAGNOSIS — B353 Tinea pedis: Secondary | ICD-10-CM | POA: Diagnosis not present

## 2019-10-24 DIAGNOSIS — I129 Hypertensive chronic kidney disease with stage 1 through stage 4 chronic kidney disease, or unspecified chronic kidney disease: Secondary | ICD-10-CM | POA: Diagnosis not present

## 2019-10-24 DIAGNOSIS — I2583 Coronary atherosclerosis due to lipid rich plaque: Secondary | ICD-10-CM | POA: Diagnosis not present

## 2019-11-05 DIAGNOSIS — M12272 Villonodular synovitis (pigmented), left ankle and foot: Secondary | ICD-10-CM | POA: Diagnosis not present

## 2019-11-05 DIAGNOSIS — L600 Ingrowing nail: Secondary | ICD-10-CM | POA: Diagnosis not present

## 2019-11-08 DIAGNOSIS — R69 Illness, unspecified: Secondary | ICD-10-CM | POA: Diagnosis not present

## 2019-11-12 ENCOUNTER — Ambulatory Visit: Payer: Medicare HMO | Admitting: "Endocrinology

## 2019-11-13 DIAGNOSIS — L600 Ingrowing nail: Secondary | ICD-10-CM | POA: Diagnosis not present

## 2019-11-13 DIAGNOSIS — Z1624 Resistance to multiple antibiotics: Secondary | ICD-10-CM | POA: Diagnosis not present

## 2019-11-13 DIAGNOSIS — Z1611 Resistance to penicillins: Secondary | ICD-10-CM | POA: Diagnosis not present

## 2019-11-13 DIAGNOSIS — M2042 Other hammer toe(s) (acquired), left foot: Secondary | ICD-10-CM | POA: Diagnosis not present

## 2019-11-13 DIAGNOSIS — B965 Pseudomonas (aeruginosa) (mallei) (pseudomallei) as the cause of diseases classified elsewhere: Secondary | ICD-10-CM | POA: Diagnosis not present

## 2019-11-19 DIAGNOSIS — L97521 Non-pressure chronic ulcer of other part of left foot limited to breakdown of skin: Secondary | ICD-10-CM | POA: Diagnosis not present

## 2019-12-04 DIAGNOSIS — R69 Illness, unspecified: Secondary | ICD-10-CM | POA: Diagnosis not present

## 2019-12-13 ENCOUNTER — Other Ambulatory Visit: Payer: Self-pay | Admitting: "Endocrinology

## 2019-12-24 DIAGNOSIS — E1122 Type 2 diabetes mellitus with diabetic chronic kidney disease: Secondary | ICD-10-CM | POA: Diagnosis not present

## 2019-12-24 DIAGNOSIS — E210 Primary hyperparathyroidism: Secondary | ICD-10-CM | POA: Diagnosis not present

## 2019-12-24 DIAGNOSIS — N183 Chronic kidney disease, stage 3 unspecified: Secondary | ICD-10-CM | POA: Diagnosis not present

## 2019-12-24 DIAGNOSIS — I129 Hypertensive chronic kidney disease with stage 1 through stage 4 chronic kidney disease, or unspecified chronic kidney disease: Secondary | ICD-10-CM | POA: Diagnosis not present

## 2019-12-24 DIAGNOSIS — I2510 Atherosclerotic heart disease of native coronary artery without angina pectoris: Secondary | ICD-10-CM | POA: Diagnosis not present

## 2019-12-30 DIAGNOSIS — R69 Illness, unspecified: Secondary | ICD-10-CM | POA: Diagnosis not present

## 2020-01-06 DIAGNOSIS — M5414 Radiculopathy, thoracic region: Secondary | ICD-10-CM | POA: Diagnosis not present

## 2020-01-06 DIAGNOSIS — I10 Essential (primary) hypertension: Secondary | ICD-10-CM | POA: Diagnosis not present

## 2020-01-06 DIAGNOSIS — R269 Unspecified abnormalities of gait and mobility: Secondary | ICD-10-CM | POA: Diagnosis not present

## 2020-01-06 DIAGNOSIS — R278 Other lack of coordination: Secondary | ICD-10-CM | POA: Diagnosis not present

## 2020-01-06 DIAGNOSIS — E1142 Type 2 diabetes mellitus with diabetic polyneuropathy: Secondary | ICD-10-CM | POA: Diagnosis not present

## 2020-01-19 DIAGNOSIS — Z794 Long term (current) use of insulin: Secondary | ICD-10-CM | POA: Diagnosis not present

## 2020-01-19 DIAGNOSIS — G8929 Other chronic pain: Secondary | ICD-10-CM | POA: Diagnosis not present

## 2020-01-19 DIAGNOSIS — I252 Old myocardial infarction: Secondary | ICD-10-CM | POA: Diagnosis not present

## 2020-01-19 DIAGNOSIS — E669 Obesity, unspecified: Secondary | ICD-10-CM | POA: Diagnosis not present

## 2020-01-19 DIAGNOSIS — E785 Hyperlipidemia, unspecified: Secondary | ICD-10-CM | POA: Diagnosis not present

## 2020-01-19 DIAGNOSIS — E1165 Type 2 diabetes mellitus with hyperglycemia: Secondary | ICD-10-CM | POA: Diagnosis not present

## 2020-01-19 DIAGNOSIS — I10 Essential (primary) hypertension: Secondary | ICD-10-CM | POA: Diagnosis not present

## 2020-01-19 DIAGNOSIS — I4891 Unspecified atrial fibrillation: Secondary | ICD-10-CM | POA: Diagnosis not present

## 2020-01-19 DIAGNOSIS — D6869 Other thrombophilia: Secondary | ICD-10-CM | POA: Diagnosis not present

## 2020-01-19 DIAGNOSIS — I2510 Atherosclerotic heart disease of native coronary artery without angina pectoris: Secondary | ICD-10-CM | POA: Diagnosis not present

## 2020-01-28 DIAGNOSIS — R69 Illness, unspecified: Secondary | ICD-10-CM | POA: Diagnosis not present

## 2020-02-05 ENCOUNTER — Other Ambulatory Visit: Payer: Self-pay

## 2020-02-05 DIAGNOSIS — Z794 Long term (current) use of insulin: Secondary | ICD-10-CM

## 2020-02-12 ENCOUNTER — Encounter: Payer: Self-pay | Admitting: Nurse Practitioner

## 2020-02-12 ENCOUNTER — Other Ambulatory Visit: Payer: Self-pay

## 2020-02-12 ENCOUNTER — Ambulatory Visit (INDEPENDENT_AMBULATORY_CARE_PROVIDER_SITE_OTHER): Payer: Medicare HMO | Admitting: Nurse Practitioner

## 2020-02-12 VITALS — BP 196/74 | HR 69 | Ht 66.0 in | Wt 196.0 lb

## 2020-02-12 DIAGNOSIS — N1831 Chronic kidney disease, stage 3a: Secondary | ICD-10-CM

## 2020-02-12 DIAGNOSIS — E119 Type 2 diabetes mellitus without complications: Secondary | ICD-10-CM | POA: Diagnosis not present

## 2020-02-12 DIAGNOSIS — E1121 Type 2 diabetes mellitus with diabetic nephropathy: Secondary | ICD-10-CM | POA: Diagnosis not present

## 2020-02-12 DIAGNOSIS — Z794 Long term (current) use of insulin: Secondary | ICD-10-CM

## 2020-02-12 DIAGNOSIS — I10 Essential (primary) hypertension: Secondary | ICD-10-CM | POA: Diagnosis not present

## 2020-02-12 LAB — POCT GLYCOSYLATED HEMOGLOBIN (HGB A1C): Hemoglobin A1C: 8.5 % — AB (ref 4.0–5.6)

## 2020-02-12 MED ORDER — GLIPIZIDE ER 2.5 MG PO TB24: 2.5000 mg | ORAL_TABLET | Freq: Every day | ORAL | 3 refills | Status: DC

## 2020-02-12 MED ORDER — BASAGLAR KWIKPEN 100 UNIT/ML ~~LOC~~ SOPN: 30.0000 [IU] | PEN_INJECTOR | Freq: Every day | SUBCUTANEOUS | 2 refills | Status: DC

## 2020-02-12 NOTE — Progress Notes (Signed)
02/12/2020, 3:27 PM     Endocrinology follow-up note   Subjective:    Patient ID: HALEE GLYNN, female    DOB: 09-Jul-1945.  CHLOE FLIS is being seen in follow-up  for management of currently uncontrolled symptomatic diabetes requested by  Redmond School, MD.   Past Medical History:  Diagnosis Date   Arthritis    "all over"   CAD (coronary artery disease)    a. NSTEMI 12/2013 - occluded dominant RCA with L-R collaterals, moderate prox segmental LAD disease, for medical therapy initially, EF 60% with subtle inferobasal hypokinesia. Consider PCI for refractory CP.   CKD (chronic kidney disease), stage III    Hypertension    Migraines    "weekly" (01/06/2014)   Sickle cell trait ([place])    TIA (transient ischemic attack) 1978   Type II diabetes mellitus ([place])    Vertigo    Past Surgical History:  Procedure Laterality Date   APPENDECTOMY  March 2014   had appendix frozen   CARDIAC CATHETERIZATION  "years ago" & 01/05/2014   CARDIOVERSION N/A 03/17/2014   Procedure: CARDIOVERSION;  Surgeon: [name], MD;  Location: [facility];  Service: Cardiovascular;  Laterality: N/A;   CATARACT EXTRACTION W/ INTRAOCULAR LENS  IMPLANT, BILATERAL Bilateral 04/2013-05/2013   ENDARTERECTOMY Right 02/05/2017   Procedure: ENDARTERECTOMY CAROTID-RIGHT;  Surgeon: [name], MD;  Location: [facility];  Service: Vascular;  Laterality: Right;   ESOPHAGOGASTRODUODENOSCOPY  11/08/2007    Normal esophagus without evidence of Barrett, mass, erosion/ Normal stomach, duodenal bulb   GASTRIC MOTILITY STUDY  11/13/2007   mildly delayed emptying subjectively, but normal  analysis-77% of tracer emptied at 2 hours   [place] N/A 09/15/2012   Procedure: APPENDECTOMY LAPAROSCOPIC;  Surgeon: [name], MD;  Location: [facility];  Service: General;  Laterality: N/A;    LEFT HEART CATHETERIZATION WITH CORONARY ANGIOGRAM N/A 01/05/2014   Procedure: LEFT HEART CATHETERIZATION WITH CORONARY ANGIOGRAM;  Surgeon: [name], MD;  Location[facility] C[facility];  Service: Cardiovascular;  Laterality: N/A;   REPLACEMENT TOTAL KNEE Right 05-03-06   TEE WITHOUT CARDIOVERSION N/A 03/17/2014   Procedure: TRANSESOPHAGEAL ECHOCARDIOGRAM (TEE);  Surgeon: [name], MD;  Location: [facility];  Service: Cardiovascular;  Laterality: N/A;   TONSILLECTOMY  1972   Social History   Socioeconomic History   Marital status: Divorced    Spouse name: Not on file   Number of children: Not on file   Years of education: Not on file   Highest education level: Not on file  Occupational History   Not on file  Tobacco Use   Smoking status: Never Smoker   Smokeless tobacco: Never Used  Vaping Use   Vaping Use: Never used  Substance and Sexual Activity   Alcohol use: No   Drug use: No   Sexual activity: Not Currently    Birth control/protection: Post-menopausal  Other Topics Concern   Not on file  Social History Narrative   Not on file   Social Determinants of Health   Financial Resource Strain:    Difficulty of Paying Living Expenses: Not on file  Food Insecurity:    Worried About Running  Out of Food in the Last Year: Not on file   Ran Out of Food in the Last Year: Not on file  Transportation Needs:    Lack of Transportation (Medical): Not on file   Lack of Transportation (Non-Medical): Not on file  Physical Activity:    Days of Exercise per Week: Not on file   Minutes of Exercise per Session: Not on file  Stress:    Feeling of Stress : Not on file  Social Connections:    Frequency of Communication with Friends and Family: Not on file   Frequency of Social Gatherings with Friends and Family: Not on file   Attends Religious Services: Not on file   Active Member of Clubs or Organizations: Not on file   Attends Club or Organization  Meetings: Not on file   Marital Status: Not on file   Outpatient Encounter Medications as of 02/12/2020  Medication Sig   aspirin EC 81 MG tablet Take 81 mg by mouth daily.   atorvastatin (LIPITOR) 80 MG tablet Take 1 tablet (80 mg total) by mouth every evening.   carvedilol (COREG) 6.25 MG tablet Take 1 tablet (6.25 mg total) by mouth 2 (two) times daily. (Patient taking differently: Take 3.125 mg by mouth 2 (two) times daily. )   colesevelam (WELCHOL) 625 MG tablet Take 1,875 mg by mouth 2 (two) times daily with a meal. *May take 6 tablets once a day with meals*   ELIQUIS 5 MG TABS tablet TAKE 1 TABLET BY MOUTH TWICE A DAY   furosemide (LASIX) 40 MG tablet Take 40 mg by mouth daily.    hydrALAZINE (APRESOLINE) 25 MG tablet Take 1 tablet (25 mg total) by mouth 3 (three) times daily.   Insulin Glargine (BASAGLAR KWIKPEN) 100 UNIT/ML Inject 0.3 mLs (30 Units total) into the skin at bedtime.   insulin lispro (HUMALOG) 100 UNIT/ML injection Inject 5-8 Units into the skin 3 (three) times daily before meals.   lisinopril (ZESTRIL) 10 MG tablet Take 10 mg by mouth daily.    nitroGLYCERIN (NITROSTAT) 0.4 MG SL tablet Place 1 tablet (0.4 mg total) under the tongue every 5 (five) minutes as needed for chest pain (up to 3 doses).   [DISCONTINUED] glipiZIDE (GLUCOTROL XL) 5 MG 24 hr tablet TAKE 1 TABLET BY MOUTH EVERY DAY WITH BREAKFAST   [DISCONTINUED] Insulin Glargine (BASAGLAR KWIKPEN) 100 UNIT/ML SOPN Inject 30 Units into the skin at bedtime.   glipiZIDE (GLUCOTROL XL) 2.5 MG 24 hr tablet Take 1 tablet (2.5 mg total) by mouth daily with breakfast.   No facility-administered encounter medications on file as of 02/12/2020.    ALLERGIES: Allergies  Allergen Reactions   Penicillins Hives    Has patient had a PCN reaction causing immediate rash, facial/tongue/throat swelling, SOB or lightheadedness with hypotension: no Has patient had a PCN reaction causing severe rash involving mucus  membranes or skin necrosis: No  Has patient had a PCN reaction that required hospitalization: no Has patient had a PCN reaction occurring within the last 10 years: no If all of the above answers are "NO", then may proceed with Cephalosporin use.     VACCINATION STATUS:  There is no immunization history on file for this patient.  Diabetes She presents for her follow-up diabetic visit. She has type 2 diabetes mellitus. Onset time: She was diagnosed at approximate age of 21 years. Her disease course has been fluctuating. Pertinent negatives for hypoglycemia include no confusion, headaches, pallor or seizures. Pertinent negatives for diabetes  include no chest pain, no fatigue, no foot paresthesias, no foot ulcerations, no polydipsia, no polyphagia and no polyuria. There are no hypoglycemic complications. Symptoms are improving. Diabetic complications include heart disease, nephropathy, PVD and retinopathy. Risk factors for coronary artery disease include family history, dyslipidemia, diabetes mellitus, hypertension, sedentary lifestyle and post-menopausal. Current diabetic treatment includes insulin injections and oral agent (monotherapy). She is compliant with treatment most of the time. Her weight is fluctuating minimally. She is following a generally unhealthy diet. When asked about meal planning, she reported none. She has not had a previous visit with a dietitian. She participates in exercise intermittently. Her home blood glucose trend is fluctuating minimally. Her breakfast blood glucose range is generally 140-180 mg/dl. Her lunch blood glucose range is generally 140-180 mg/dl. Her dinner blood glucose range is generally >200 mg/dl. Her bedtime blood glucose range is generally >200 mg/dl. (She reports to the clinic, accompanied by her daughter, with logs showing widely fluctuating glycemic profile.  There are some random hypoglycemia noted, associated with deviation from her meal pattern.  Her  point-of-care A1c today is 8.5%, overall unchanged from last visits 8.3% in April 2021.) An ACE inhibitor/angiotensin II receptor blocker is being taken. She does not see a podiatrist.Eye exam is current (She has retinopathy status post multiple laser therapies on bilateral eyes.).  Hyperlipidemia This is a chronic problem. The current episode started more than 1 year ago. The problem is controlled. Recent lipid tests were reviewed and are normal. Exacerbating diseases include chronic renal disease and diabetes. Factors aggravating her hyperlipidemia include fatty foods. Pertinent negatives include no chest pain, myalgias or shortness of breath. Current antihyperlipidemic treatment includes statins. The current treatment provides moderate improvement of lipids. Compliance problems include adherence to diet.  Risk factors for coronary artery disease include diabetes mellitus, dyslipidemia, family history, hypertension, a sedentary lifestyle and post-menopausal.  Hypertension This is a chronic problem. The current episode started more than 1 year ago. The problem has been gradually worsening since onset. The problem is uncontrolled. Pertinent negatives include no chest pain, headaches, palpitations or shortness of breath. There are no associated agents to hypertension. Risk factors for coronary artery disease include dyslipidemia, diabetes mellitus, family history, sedentary lifestyle and post-menopausal state. Past treatments include ACE inhibitors, beta blockers and diuretics. The current treatment provides mild improvement. Compliance problems include diet.  Hypertensive end-organ damage includes kidney disease, CAD/MI, PVD and retinopathy. Identifiable causes of hypertension include chronic renal disease.    Review of systems  Constitutional: + Minimally fluctuating body weight, current  Body mass index is 31.64 kg/m. , no fatigue, no subjective hyperthermia, no subjective hypothermia Eyes: no blurry  vision, no xerophthalmia ENT: no sore throat, no dysphagia/odynophagia, no hoarseness Cardiovascular: no Chest Pain, no Shortness of Breath, no palpitations, no leg swelling Respiratory: no cough, no shortness of breath Gastrointestinal: no Nausea/Vomiting/Diarhhea Musculoskeletal: + mild muscle/joint aches Skin: no rashes, no hyperemia Neurological: no tremors, no numbness, no tingling, no dizziness Psychiatric: no depression, no anxiety   Objective:    BP (!) 196/74 (BP Location: Left Arm, Patient Position: Sitting)    Pulse 69    Ht 5\' 6"  (1.676 m)    Wt 196 lb (88.9 kg)    BMI 31.64 kg/m   Wt Readings from Last 3 Encounters:  02/12/20 196 lb (88.9 kg)  10/13/19 198 lb (89.8 kg)  07/15/19 194 lb (88 kg)    BP Readings from Last 3 Encounters:  02/12/20 (!) 196/74  10/13/19 (!) 175/73  07/15/19 (!) 160/85    Physical Exam- Limited  Constitutional:  Body mass index is 31.64 kg/m. , not in acute distress, normal state of mind Eyes:  EOMI, no exophthalmos Neck: Supple Thyroid: No gross goiter Cardiovascular: RRR, no murmers, rubs, or gallops, no edema Respiratory: Adequate breathing efforts, no crackles, rales, rhonchi, or wheezing Musculoskeletal: no gross deformities, strength intact in all four extremities, no gross restriction of joint movements, walks with a cane Skin:  no rashes, no hyperemia Neurological: no tremor with outstretched hands    CMP     Component Value Date/Time   NA 140 05/10/2019 0705   NA 145 (H) 12/03/2017 0816   K 4.4 05/10/2019 0705   CL 109 05/10/2019 0705   CO2 22 05/10/2019 0705   GLUCOSE 119 (H) 05/10/2019 0705   BUN 41 (H) 05/10/2019 0705   BUN 18 12/03/2017 0816   CREATININE 1.33 (H) 05/10/2019 0705   CREATININE 1.40 (H) 07/01/2015 0926   CALCIUM 9.7 05/10/2019 0705   PROT 7.4 05/04/2019 1709   PROT 6.3 12/03/2017 0816   ALBUMIN 3.7 05/04/2019 1709   ALBUMIN 3.5 12/03/2017 0816   AST 33 05/04/2019 1709   ALT 22 05/04/2019 1709    ALKPHOS 85 05/04/2019 1709   BILITOT 0.5 05/04/2019 1709   BILITOT 0.2 12/03/2017 0816   GFRNONAA 39 (L) 05/10/2019 0705   GFRAA 46 (L) 05/10/2019 0705    Diabetic Labs (most recent): Lab Results  Component Value Date   HGBA1C 8.5 (A) 02/12/2020   HGBA1C 8.3 (A) 10/13/2019   HGBA1C 8.1 (A) 07/15/2019     Lipid Panel     Component Value Date/Time   CHOL 128 05/04/2019 2350   CHOL 110 12/03/2017 0816   TRIG 99 05/04/2019 2350   HDL 51 05/04/2019 2350   HDL 58 12/03/2017 0816   CHOLHDL 2.5 05/04/2019 2350   VLDL 20 05/04/2019 2350   LDLCALC 57 05/04/2019 2350   LDLCALC 42 12/03/2017 0816    Assessment & Plan:   1. DM type 2 causing vascular disease (HCC)  - [name] has currently uncontrolled symptomatic type 2 DM since  74 years of age.  She reports to the clinic, accompanied by her daughter, with detailed logs showing widely fluctuating glycemic profile.  There are some random hypoglycemia noted, associated with deviation from her meal pattern.  Her point-of-care A1c today is 8.5%, overall unchanged from last visits 8.3% in April 2021.    -her diabetes is complicated by coronary artery disease, carotid  atherosclerosis, renal insufficiency, retinopathy, peripheral neuropathy and RUDY DOMEK remains at a high risk for more acute and chronic complications which include CAD, CVA, CKD, retinopathy, and neuropathy. These are all discussed in detail with the patient.  - The patient admits there is a room for improvement in their diet and drink choices. -  Suggestion is made for the patient to avoid simple carbohydrates from their diet including Cakes, Sweet Desserts / Pastries, Ice Cream, Soda (diet and regular), Sweet Tea, Candies, Chips, Cookies, Sweet Pastries,  Store Bought Juices, Alcohol in Excess of  1-2 drinks a day, Artificial Sweeteners, Coffee Creamer, and "Sugar-free" Products. This will help patient to have stable blood glucose profile and  potentially avoid unintended weight gain.   - I encouraged the patient to switch to  unprocessed or minimally processed complex starch and increased protein intake (animal or plant source), fruits, and vegetables.   - Patient is advised to stick to a routine mealtimes to  eat 3 meals  a day and avoid unnecessary snacks ( to snack only to correct hypoglycemia).  - she has seen Jearld Fenton, RDN, CDE for individualized diabetes education.  - I have approached her with the following individualized plan to manage diabetes and patient agrees:   -Based on her glycemic response, she will continue to need intensive treatment with readjusted basal/bolus insulin in order for her to achieve and maintain control of diabetes to target.   - Her accompanying daughter is offering to help her and does a great job monitoring blood glucose and insulin administration.  -Priority #1 in the care of her diabetes will be to avoid hypoglycemia.  -Given her recent frequent, random hypoglycemia, will reduce her glipizide to 2.5 mg XL daily with breakfast.   -She is advised to continue with current dose of Basaglar 30 units SQ at bedtime, and continue on current dose of Humalog 5-8 units if glucose above 90 and eating.  Specific instructions on how to titrate dose based off of blood sugar readings given to patient and daughter in writing.  Advice given to patient and daughter regarding timing of meals to avoid hypoglycemia. -She is instructed to continue monitoring blood glucose 4 times a day, before meals and at bedtime and report to the clinic if blood glucose is less than 70 or above 200 for 3 tests in a row.  - Patient is warned not to take insulin without proper monitoring per orders.  -Patient is not a candidate for metformin,SGLT2 inhibitors due to CKD. - she will be considered for incretin therapy as appropriate next visit. - Patient specific target  A1c;  LDL, HDL, Triglycerides, and  were discussed in  detail.  2) BP/HTN: Her blood pressure is not controlled to target.She is advised to continue Coreg 6.25 mg p.o. twice daily, Lasix 40 mg p.o. daily, hydralazine 25 mg p.o. 3 times daily, and lisinopril 10 mg p.o. daily.  3) Lipids/HPL:  Her previous lipid profile shows controlled LDL of 57 on 04/2019.  She is due for another lipid profile.  She is advised to continue Lipitor 80 mg p.o. nightly.  Side effects and precautions were discussed with her.  4)  Weight/Diet:  Her Body mass index is 31.64 kg/m.  She is a candidate for modest weight loss.  CDE Consult will be initiated , exercise, and detailed carbohydrates information provided.  5) Chronic Care/Health Maintenance:  -she  is on ACEI/ARB and Statin medications and  is encouraged to continue to follow up with Ophthalmology, Dentist,  Podiatrist at least yearly or according to recommendations, and advised to  stay away from smoking. I have recommended yearly flu vaccine and pneumonia vaccination at least every 5 years; moderate intensity exercise for up to 150 minutes weekly; and  sleep for at least 7 hours a day.  - I advised patient to maintain close follow up with Redmond School, MD for primary care needs.  - Time spent on this patient care encounter:  35 min, of which > 50% was spent in  counseling and the rest reviewing her blood glucose logs , discussing her hypoglycemia and hyperglycemia episodes, reviewing her current and  previous labs / studies  ( including abstraction from other facilities) and medications  doses and developing a  long term treatment plan and documenting her care.   Please refer to Patient Instructions for Blood Glucose Monitoring and Insulin/Medications Dosing Guide"  in media tab for additional information. Please  also refer to " Patient Self  Inventory" in the Media  tab for reviewed elements of pertinent patient history.  [name] participated in the discussions, expressed understanding, and  voiced agreement with the above plans.  All questions were answered to her satisfaction. she is encouraged to contact clinic should she have any questions or concerns prior to her return visit.    Follow up plan: - Return in about 4 months (around 06/13/2020) for Diabetes follow up, Bring glucometer and logs, Previsit labs, Virtual visit ok.  Rayetta Pigg, FNP-BC [facility] Phone: [phone] Fax: [phone]   02/12/2020, 3:27 PM  This note was partially dictated with voice recognition software. Similar sounding words can be transcribed inadequately or may not  be corrected upon review.

## 2020-02-12 NOTE — Patient Instructions (Signed)

## 2020-02-13 DIAGNOSIS — Z6832 Body mass index (BMI) 32.0-32.9, adult: Secondary | ICD-10-CM | POA: Diagnosis not present

## 2020-02-13 DIAGNOSIS — M1991 Primary osteoarthritis, unspecified site: Secondary | ICD-10-CM | POA: Diagnosis not present

## 2020-02-13 DIAGNOSIS — I10 Essential (primary) hypertension: Secondary | ICD-10-CM | POA: Diagnosis not present

## 2020-02-13 DIAGNOSIS — E1129 Type 2 diabetes mellitus with other diabetic kidney complication: Secondary | ICD-10-CM | POA: Diagnosis not present

## 2020-02-19 DIAGNOSIS — I739 Peripheral vascular disease, unspecified: Secondary | ICD-10-CM | POA: Diagnosis not present

## 2020-02-19 DIAGNOSIS — L603 Nail dystrophy: Secondary | ICD-10-CM | POA: Diagnosis not present

## 2020-02-19 DIAGNOSIS — L84 Corns and callosities: Secondary | ICD-10-CM | POA: Diagnosis not present

## 2020-02-19 DIAGNOSIS — E1151 Type 2 diabetes mellitus with diabetic peripheral angiopathy without gangrene: Secondary | ICD-10-CM | POA: Diagnosis not present

## 2020-02-24 DIAGNOSIS — R69 Illness, unspecified: Secondary | ICD-10-CM | POA: Diagnosis not present

## 2020-03-25 DIAGNOSIS — E1140 Type 2 diabetes mellitus with diabetic neuropathy, unspecified: Secondary | ICD-10-CM | POA: Diagnosis not present

## 2020-03-25 DIAGNOSIS — E6609 Other obesity due to excess calories: Secondary | ICD-10-CM | POA: Diagnosis not present

## 2020-03-25 DIAGNOSIS — M1991 Primary osteoarthritis, unspecified site: Secondary | ICD-10-CM | POA: Diagnosis not present

## 2020-03-25 DIAGNOSIS — I10 Essential (primary) hypertension: Secondary | ICD-10-CM | POA: Diagnosis not present

## 2020-03-26 DIAGNOSIS — R69 Illness, unspecified: Secondary | ICD-10-CM | POA: Diagnosis not present

## 2020-04-23 DIAGNOSIS — Z23 Encounter for immunization: Secondary | ICD-10-CM | POA: Diagnosis not present

## 2020-04-24 DIAGNOSIS — E6609 Other obesity due to excess calories: Secondary | ICD-10-CM | POA: Diagnosis not present

## 2020-04-24 DIAGNOSIS — E1140 Type 2 diabetes mellitus with diabetic neuropathy, unspecified: Secondary | ICD-10-CM | POA: Diagnosis not present

## 2020-04-24 DIAGNOSIS — I10 Essential (primary) hypertension: Secondary | ICD-10-CM | POA: Diagnosis not present

## 2020-04-24 DIAGNOSIS — M1991 Primary osteoarthritis, unspecified site: Secondary | ICD-10-CM | POA: Diagnosis not present

## 2020-04-26 DIAGNOSIS — R69 Illness, unspecified: Secondary | ICD-10-CM | POA: Diagnosis not present

## 2020-05-22 DIAGNOSIS — R69 Illness, unspecified: Secondary | ICD-10-CM | POA: Diagnosis not present

## 2020-05-22 IMAGING — MR MR CERVICAL SPINE WO/W CM
4 of 9 series · 17 of 48 positions shown · IV contrast (7ml Gadavist)
Comparison: None.

CLINICAL DATA: Radiculopathy, right-sided weakness

EXAM:
MRI CERVICAL SPINE WITHOUT AND WITH CONTRAST
TECHNIQUE: Multiplanar and multiecho pulse sequences of the cervical spine, to
include the craniocervical junction and cervicothoracic junction,
were obtained without and with intravenous contrast.
CONTRAST:  7mL GADAVIST GADOBUTROL 1 MMOL/ML IV SOLN

[Series 2: T2 · sagittal · 3.0mm · 0.32mm/px · 3 of 13 slices shown (1 of 3)]
[im 1/13]
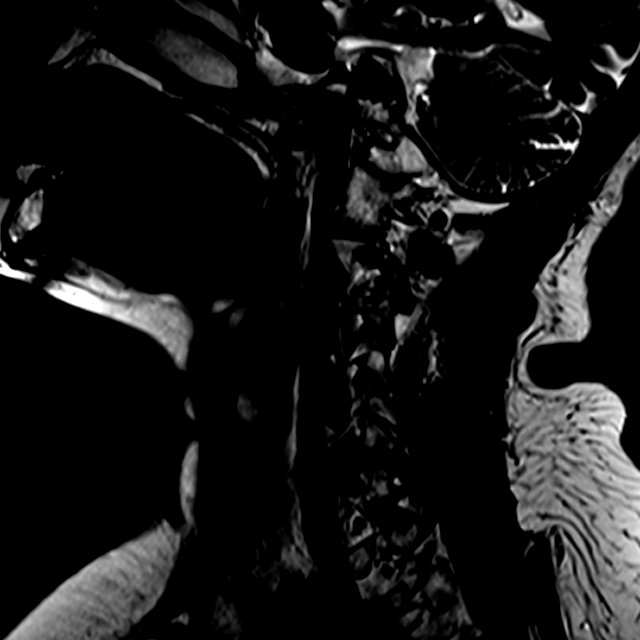
[im 7/13]
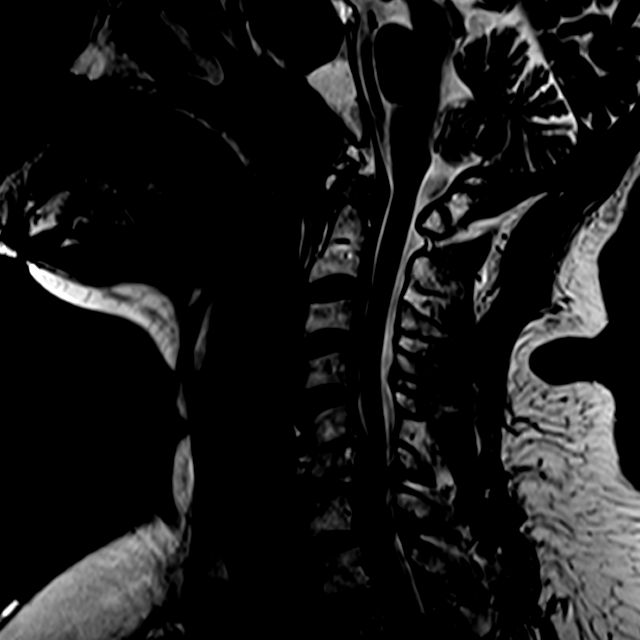
[im 13/13]
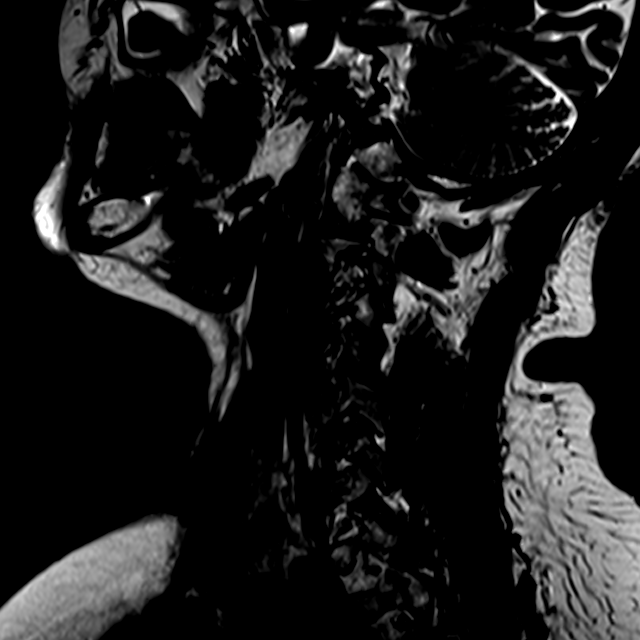

[Series 5: T2 · axial · 3.0mm · 0.24mm/px · z∈[-246,-154]mm · 7 of 30 slices shown (2 of 3)]
[im 1/30]
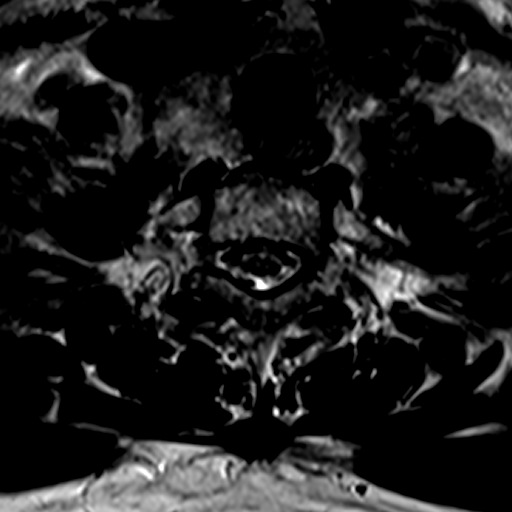
[im 5/30]
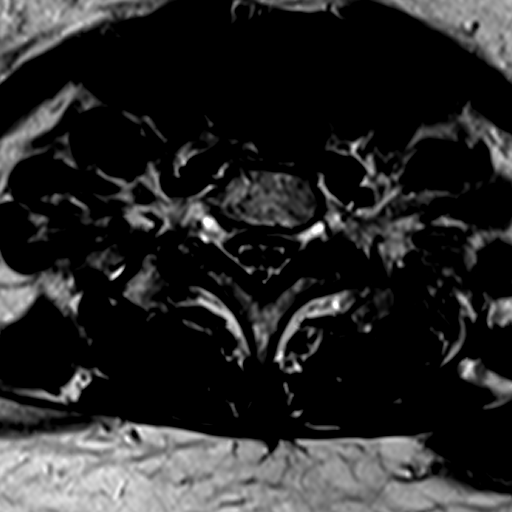
[im 10/30]
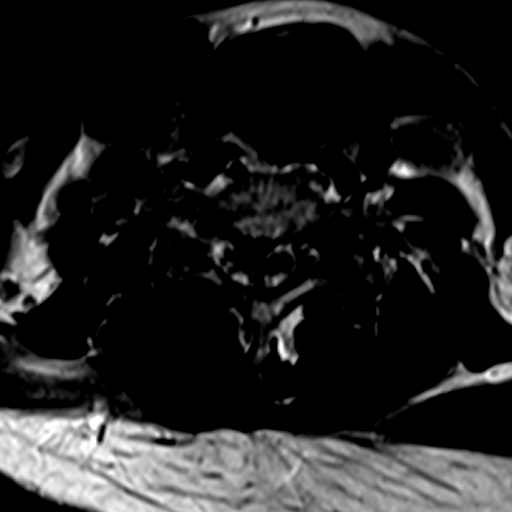
[im 15/30]
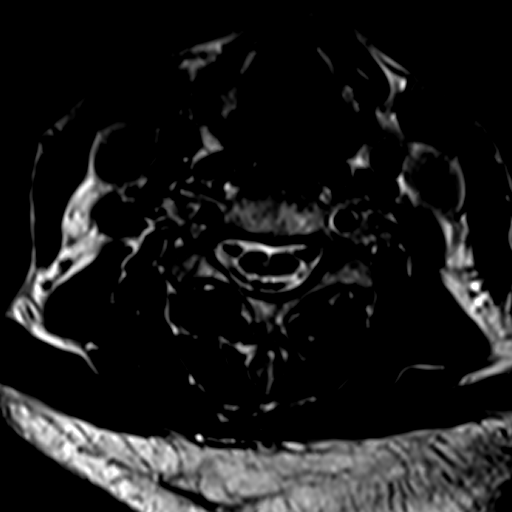
[im 20/30]
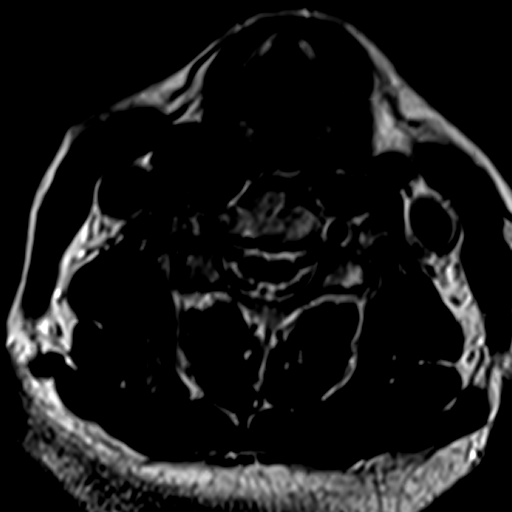
[im 25/30]
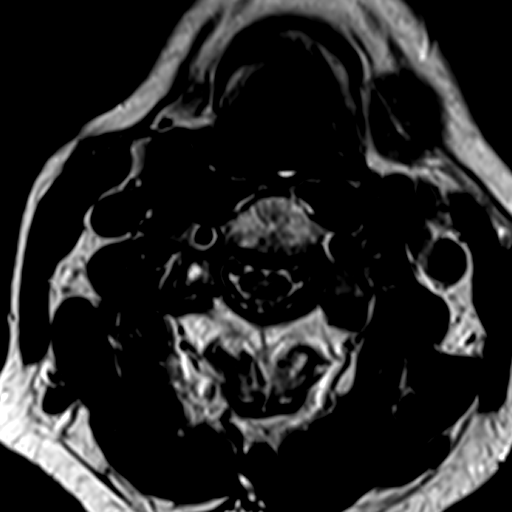
[im 30/30]
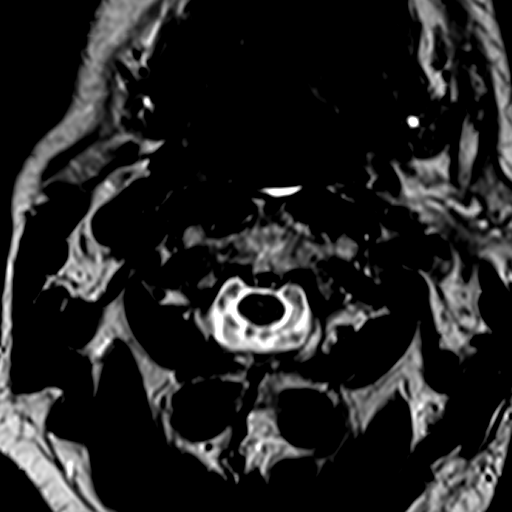

[Series 7: T2 · axial · 3.0mm · 0.30mm/px · z∈[-248,-171]mm · 4 of 30 slices shown (3 of 3)]
[im 1/30]
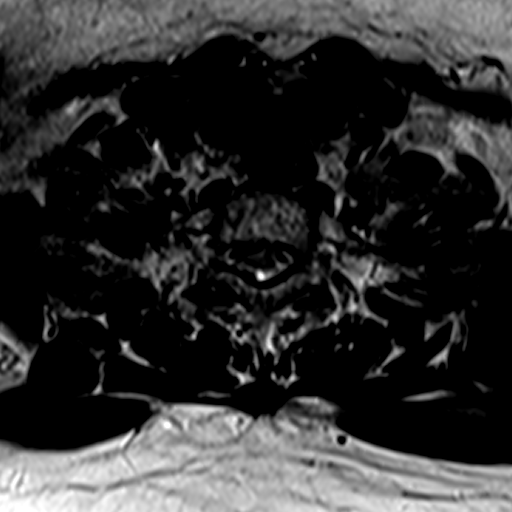
[im 5/30]
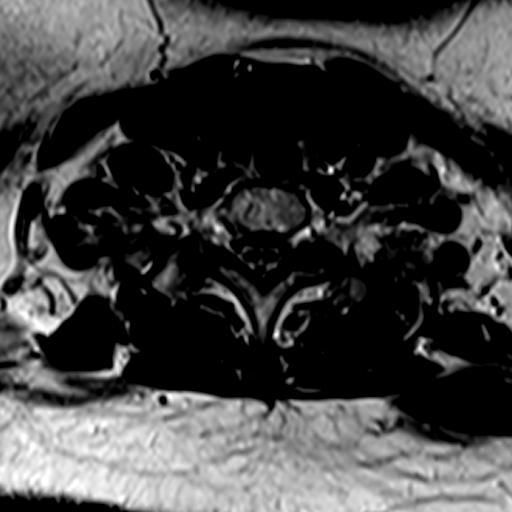
[im 15/30]
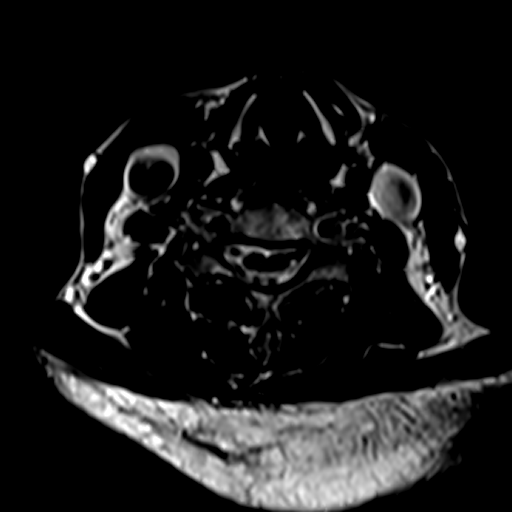
[im 25/30]
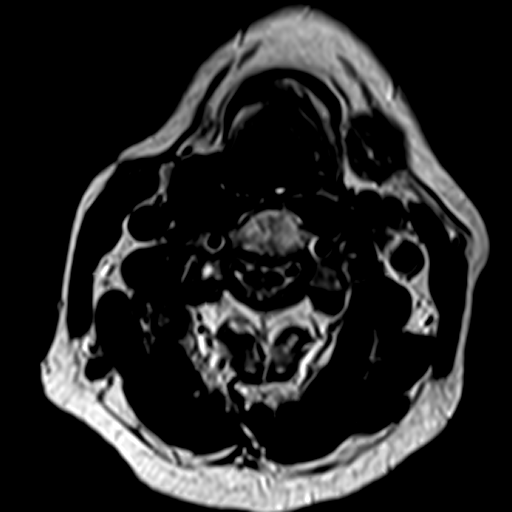

[Series 8: T1 · axial · 3.0mm · 0.26mm/px · z∈[-234,-170]mm · 3 of 30 slices shown]
[im 5/30]
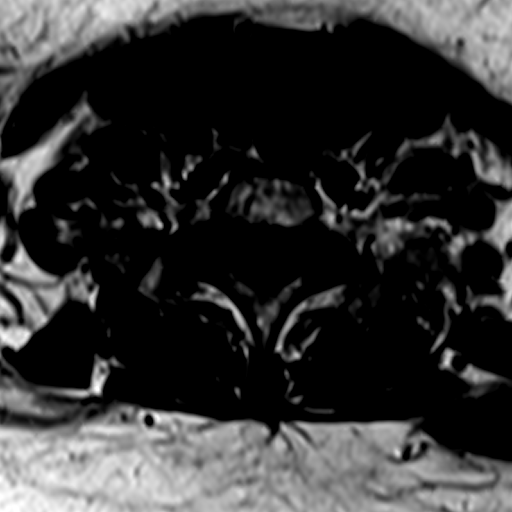
[im 15/30]
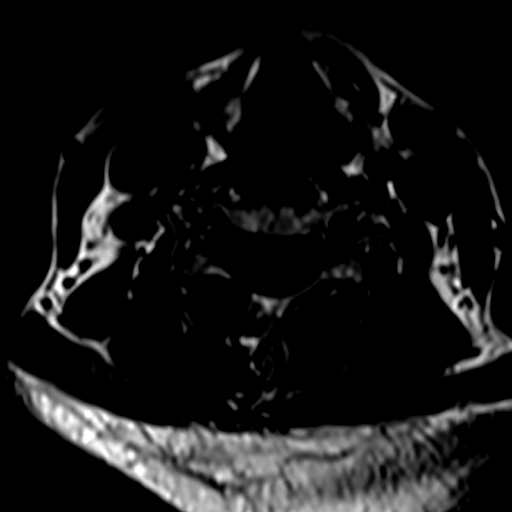
[im 25/30]
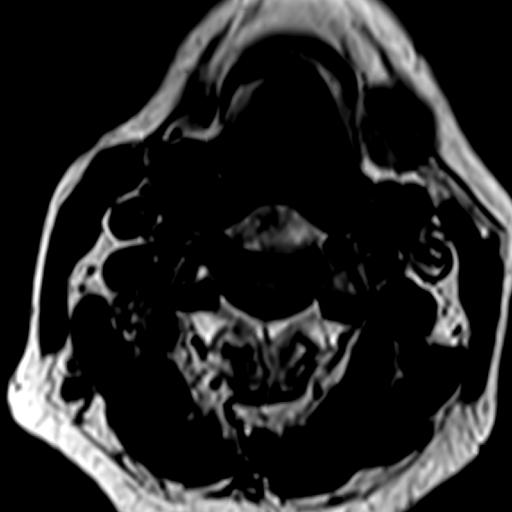

[17 of 48 positions shown; findings below may reference images not displayed]

FINDINGS: Alignment: Straightening of the cervical lordosis. There is focal
kyphosis at C5-C6.

Vertebrae: Vertebral body heights are maintained apart from anterior
wedging and endplate irregularity at C5 and C6. There is no
significant marrow edema. No suspicious osseous lesion.

Cord: No abnormal cord signal.

Posterior Fossa, vertebral arteries, paraspinal tissues:
Unremarkable.

Disc levels:

C2-C3:  Disc bulge.  No significant canal or foraminal stenosis.

C3-C4: Small central disc protrusion. Mild canal stenosis. No
foraminal stenosis.

C4-C5: Very small central disc protrusion. Mild facet and
uncovertebral hypertrophy. No canal or foraminal stenosis.

C5-C6: Right central disc extrusion extending below the disc level,
endplate osteophytes, and facet and uncovertebral hypertrophy. Mild
canal stenosis with flattening of the right ventral aspect of the
cord. No foraminal stenosis.

C6-C7: Disc bulge, endplate osteophytes, and facet and uncovertebral
hypertrophy. Moderate canal stenosis. Marked foraminal stenosis,
right greater than left.

C7-T1:  Facet hypertrophy.  No canal or foraminal stenosis.
IMPRESSION: Multilevel degenerative changes as detailed above, greatest at C5-C6
and C6-C7.

## 2020-06-09 DIAGNOSIS — N1831 Chronic kidney disease, stage 3a: Secondary | ICD-10-CM | POA: Diagnosis not present

## 2020-06-09 DIAGNOSIS — E1121 Type 2 diabetes mellitus with diabetic nephropathy: Secondary | ICD-10-CM | POA: Diagnosis not present

## 2020-06-09 DIAGNOSIS — Z794 Long term (current) use of insulin: Secondary | ICD-10-CM | POA: Diagnosis not present

## 2020-06-10 LAB — COMPREHENSIVE METABOLIC PANEL
ALT: 31 IU/L (ref 0–32)
AST: 41 IU/L — ABNORMAL HIGH (ref 0–40)
Albumin/Globulin Ratio: 1.6 (ref 1.2–2.2)
Albumin: 3.9 g/dL (ref 3.7–4.7)
Alkaline Phosphatase: 97 IU/L (ref 44–121)
BUN/Creatinine Ratio: 13 (ref 12–28)
BUN: 16 mg/dL (ref 8–27)
Bilirubin Total: 0.2 mg/dL (ref 0.0–1.2)
CO2: 22 mmol/L (ref 20–29)
Calcium: 9.8 mg/dL (ref 8.7–10.3)
Chloride: 107 mmol/L — ABNORMAL HIGH (ref 96–106)
Creatinine, Ser: 1.26 mg/dL — ABNORMAL HIGH (ref 0.57–1.00)
GFR calc Af Amer: 48 mL/min/{1.73_m2} — ABNORMAL LOW (ref >59)
GFR calc non Af Amer: 42 mL/min/{1.73_m2} — ABNORMAL LOW (ref >59)
Globulin, Total: 2.4 g/dL (ref 1.5–4.5)
Glucose: 126 mg/dL — ABNORMAL HIGH (ref 65–99)
Potassium: 4.2 mmol/L (ref 3.5–5.2)
Sodium: 141 mmol/L (ref 134–144)
Total Protein: 6.3 g/dL (ref 6.0–8.5)

## 2020-06-10 LAB — LIPID PANEL
Chol/HDL Ratio: 2.1 ratio (ref 0.0–4.4)
Cholesterol, Total: 128 mg/dL (ref 100–199)
HDL: 61 mg/dL (ref >39)
LDL Chol Calc (NIH): 52 mg/dL (ref 0–99)
Triglycerides: 77 mg/dL (ref 0–149)
VLDL Cholesterol Cal: 15 mg/dL (ref 5–40)

## 2020-06-10 LAB — HEMOGLOBIN A1C
Est. average glucose Bld gHb Est-mCnc: 186 mg/dL
Hgb A1c MFr Bld: 8.1 % — ABNORMAL HIGH (ref 4.8–5.6)

## 2020-06-10 LAB — TSH: TSH: 1.66 u[IU]/mL (ref 0.450–4.500)

## 2020-06-10 LAB — MICROALBUMIN / CREATININE URINE RATIO
Creatinine, Urine: 95.7 mg/dL
Microalb/Creat Ratio: 116 mg/g creat — ABNORMAL HIGH (ref 0–29)
Microalbumin, Urine: 110.9 ug/mL

## 2020-06-10 LAB — VITAMIN D 25 HYDROXY (VIT D DEFICIENCY, FRACTURES): Vit D, 25-Hydroxy: 8.1 ng/mL — ABNORMAL LOW (ref 30.0–100.0)

## 2020-06-10 LAB — T4, FREE: Free T4: 1.32 ng/dL (ref 0.82–1.77)

## 2020-06-15 ENCOUNTER — Telehealth (INDEPENDENT_AMBULATORY_CARE_PROVIDER_SITE_OTHER): Payer: Medicare HMO | Admitting: Nurse Practitioner

## 2020-06-15 ENCOUNTER — Encounter: Payer: Self-pay | Admitting: Nurse Practitioner

## 2020-06-15 DIAGNOSIS — I10 Essential (primary) hypertension: Secondary | ICD-10-CM

## 2020-06-15 DIAGNOSIS — N1831 Chronic kidney disease, stage 3a: Secondary | ICD-10-CM

## 2020-06-15 DIAGNOSIS — E1122 Type 2 diabetes mellitus with diabetic chronic kidney disease: Secondary | ICD-10-CM

## 2020-06-15 DIAGNOSIS — Z794 Long term (current) use of insulin: Secondary | ICD-10-CM

## 2020-06-15 MED ORDER — BASAGLAR KWIKPEN 100 UNIT/ML ~~LOC~~ SOPN: 35.0000 [IU] | PEN_INJECTOR | Freq: Every day | SUBCUTANEOUS | 2 refills | Status: DC

## 2020-06-15 NOTE — Patient Instructions (Signed)

## 2020-06-15 NOTE — Progress Notes (Signed)
06/15/2020, 2:36 PM     Endocrinology follow-up note   TELEHEALTH VISIT: The patient is being engaged in telehealth visit due to COVID-19.  This type of visit limits physical examination significantly, and thus is not preferable over face-to-face encounters.  I connected with  Cassandra Adams on 06/15/20 by a video enabled telemedicine application and verified that I am speaking with the correct person using two identifiers.   I discussed the limitations of evaluation and management by telemedicine. The patient expressed understanding and agreed to proceed.    The participants involved in this visit include: Brita Romp, NP located at [facility] and Cassandra Adams  located at their personal residence listed and her Daughter Alfreda.    Subjective:    Patient ID: Cassandra Adams, female    DOB: 1945-07-28.  Cassandra Adams is being seen in follow-up  for management of currently uncontrolled symptomatic diabetes requested by  Redmond School, MD.   Past Medical History:  Diagnosis Date  . Arthritis    "all over"  . CAD (coronary artery disease)    a. NSTEMI 12/2013 - occluded dominant RCA with L-R collaterals, moderate prox segmental LAD disease, for medical therapy initially, EF 60% with subtle inferobasal hypokinesia. Consider PCI for refractory CP.  [name] CKD (chronic kidney disease), stage III ([place])   . Hypertension   . Migraines    "weekly" (01/06/2014)  . Sickle cell trait ([place])   . TIA (transient ischemic attack) 1978  . Type II diabetes mellitus ([place])   . Vertigo    Past Surgical History:  Procedure Laterality Date  . APPENDECTOMY  March 2014   had appendix frozen  . CARDIAC CATHETERIZATION  "years ago" & 01/05/2014  . CARDIOVERSION N/A 03/17/2014   Procedure: CARDIOVERSION;  Surgeon: [name], MD;  Location: [facility] ENDOSCOPY;  Service:  Cardiovascular;  Laterality: N/A;  . CATARACT EXTRACTION W/ INTRAOCULAR LENS  IMPLANT, BILATERAL Bilateral 04/2013-05/2013  . ENDARTERECTOMY Right 02/05/2017   Procedure: ENDARTERECTOMY CAROTID-RIGHT;  Surgeon: [name], MD;  Location: [facility] OR;  Service: Vascular;  Laterality: Right;  . ESOPHAGOGASTRODUODENOSCOPY  11/08/2007    Normal esophagus without evidence of Barrett, mass, erosion/ Normal stomach, duodenal bulb  . GASTRIC MOTILITY STUDY  11/13/2007   mildly delayed emptying subjectively, but normal  analysis-77% of tracer emptied at 2 hours  . JOINT REPLACEMENT    . LAPAROSCOPIC APPENDECTOMY N/A 09/15/2012   Procedure: APPENDECTOMY LAPAROSCOPIC;  Surgeon: [name], MD;  Location: [facility] ORS;  Service: General;  Laterality: N/A;  . LEFT HEART CATHETERIZATION WITH CORONARY ANGIOGRAM N/A 01/05/2014   Procedure: LEFT HEART CATHETERIZATION WITH CORONARY ANGIOGRAM;  Surgeon: [name], MD;  Location: [facility] CATH LAB;  Service: Cardiovascular;  Laterality: N/A;  . REPLACEMENT TOTAL KNEE Right 05-03-06  . TEE WITHOUT CARDIOVERSION N/A 03/17/2014   Procedure: TRANSESOPHAGEAL ECHOCARDIOGRAM (TEE);  Surgeon: [name], MD;  Location: [facility] ENDOSCOPY;  Service: Cardiovascular;  Laterality: N/A;  . TONSILLECTOMY  1972   Social History   Socioeconomic History  . Marital status: Divorced    Spouse name: Not on file  . Number of children: Not on file  . Years of education: Not on  file  . Highest education level: Not on file  Occupational History  . Not on file  Tobacco Use  . Smoking status: Never Smoker  . Smokeless tobacco: Never Used  Vaping Use  . Vaping Use: Never used  Substance and Sexual Activity  . Alcohol use: No  . Drug use: No  . Sexual activity: Not Currently    Birth control/protection: Post-menopausal  Other Topics Concern  . Not on file  Social History Narrative  . Not on file   Social Determinants of Health   Financial Resource Strain: Not on file  Food  Insecurity: Not on file  Transportation Needs: Not on file  Physical Activity: Not on file  Stress: Not on file  Social Connections: Not on file   Outpatient Encounter Medications as of 06/15/2020  Medication Sig  . aspirin EC 81 MG tablet Take 81 mg by mouth daily.  [name] atorvastatin (LIPITOR) 80 MG tablet Take 1 tablet (80 mg total) by mouth every evening.  . carvedilol (COREG) 6.25 MG tablet Take 1 tablet (6.25 mg total) by mouth 2 (two) times daily. (Patient taking differently: Take 3.125 mg by mouth 2 (two) times daily. )  . colesevelam (WELCHOL) 625 MG tablet Take 1,875 mg by mouth 2 (two) times daily with a meal. *May take 6 tablets once a day with meals*  . ELIQUIS 5 MG TABS tablet TAKE 1 TABLET BY MOUTH TWICE A DAY  . furosemide (LASIX) 40 MG tablet Take 40 mg by mouth daily.   [name] glipiZIDE (GLUCOTROL XL) 2.5 MG 24 hr tablet Take 1 tablet (2.5 mg total) by mouth daily with breakfast.  . hydrALAZINE (APRESOLINE) 25 MG tablet Take 1 tablet (25 mg total) by mouth 3 (three) times daily.  . Insulin Glargine (BASAGLAR KWIKPEN) 100 UNIT/ML Inject 0.3 mLs (30 Units total) into the skin at bedtime.  . insulin lispro (HUMALOG) 100 UNIT/ML injection Inject 5-8 Units into the skin 3 (three) times daily before meals.  [name] lisinopril (ZESTRIL) 10 MG tablet Take 10 mg by mouth daily.   . nitroGLYCERIN (NITROSTAT) 0.4 MG SL tablet Place 1 tablet (0.4 mg total) under the tongue every 5 (five) minutes as needed for chest pain (up to 3 doses).   No facility-administered encounter medications on file as of 06/15/2020.    ALLERGIES: Allergies  Allergen Reactions  . Penicillins Hives    Has patient had a PCN reaction causing immediate rash, facial/tongue/throat swelling, SOB or lightheadedness with hypotension: no Has patient had a PCN reaction causing severe rash involving mucus membranes or skin necrosis: No  Has patient had a PCN reaction that required hospitalization: no Has patient had a PCN  reaction occurring within the last 10 years: no If all of the above answers are "NO", then may proceed with Cephalosporin use.     VACCINATION STATUS:  There is no immunization history on file for this patient.  Diabetes She presents for her follow-up diabetic visit. She has type 2 diabetes mellitus. Onset time: She was diagnosed at approximate age of 85 years. Her disease course has been improving. Pertinent negatives for hypoglycemia include no confusion, headaches, pallor or seizures. Pertinent negatives for diabetes include no chest pain, no fatigue, no foot paresthesias, no foot ulcerations, no polydipsia, no polyphagia and no polyuria. There are no hypoglycemic complications. Symptoms are improving. Diabetic complications include heart disease, nephropathy, PVD and retinopathy. Risk factors for coronary artery disease include family history, dyslipidemia, diabetes mellitus, hypertension, sedentary lifestyle and post-menopausal. Current diabetic  treatment includes insulin injections and oral agent (monotherapy). She is compliant with treatment most of the time. Her weight is fluctuating minimally. She is following a generally unhealthy diet. When asked about meal planning, she reported none. She has not had a previous visit with a dietitian. She participates in exercise intermittently. Her home blood glucose trend is fluctuating dramatically. Her breakfast blood glucose range is generally 180-200 mg/dl. Her lunch blood glucose range is generally 110-130 mg/dl. Her dinner blood glucose range is generally >200 mg/dl. Her bedtime blood glucose range is generally >200 mg/dl. (She presents today, accompanied by her daughter, for her virtual visit with her meter and logs available for review.  Her previsit A1c was 8.1%, improving from last visit of 8.5%.  Her last few days of readings are as follows: 12/21: 62, 233 12/20: 296, 87, 396, 164 12/19: 182, 216, 331, 121 12/18: 104, 114, 146, 47 12/17:  263,  286, 281, 397 12/16: 113, skip, 209, 204 12/15: 126, 250, 308, 82 12/14: 142, 126, 153, 213 12/13: 189, 151, 240, 435) An ACE inhibitor/angiotensin II receptor blocker is being taken. She does not see a podiatrist.Eye exam is current (She has retinopathy status post multiple laser therapies on bilateral eyes.).  Hyperlipidemia This is a chronic problem. The current episode started more than 1 year ago. The problem is controlled. Recent lipid tests were reviewed and are normal. Exacerbating diseases include chronic renal disease and diabetes. Factors aggravating her hyperlipidemia include fatty foods. Pertinent negatives include no chest pain, myalgias or shortness of breath. Current antihyperlipidemic treatment includes statins. The current treatment provides moderate improvement of lipids. Compliance problems include adherence to diet.  Risk factors for coronary artery disease include diabetes mellitus, dyslipidemia, family history, hypertension, a sedentary lifestyle and post-menopausal.  Hypertension This is a chronic problem. The current episode started more than 1 year ago. The problem has been gradually worsening since onset. The problem is uncontrolled. Pertinent negatives include no chest pain, headaches, palpitations or shortness of breath. There are no associated agents to hypertension. Risk factors for coronary artery disease include dyslipidemia, diabetes mellitus, family history, sedentary lifestyle and post-menopausal state. Past treatments include ACE inhibitors, beta blockers and diuretics. The current treatment provides mild improvement. Compliance problems include diet.  Hypertensive end-organ damage includes kidney disease, CAD/MI, PVD and retinopathy. Identifiable causes of hypertension include chronic renal disease.    Review of systems  Constitutional: + Minimally fluctuating body weight, current  There is no height or weight on file to calculate BMI. , no fatigue, no subjective  hyperthermia, no subjective hypothermia Eyes: no blurry vision, no xerophthalmia ENT: no sore throat, no dysphagia/odynophagia, no hoarseness Cardiovascular: no Chest Pain, no Shortness of Breath, no palpitations, no leg swelling Respiratory: no cough, no shortness of breath Gastrointestinal: no Nausea/Vomiting/Diarrhea Musculoskeletal: + mild muscle/joint aches Skin: no rashes, no hyperemia Neurological: no tremors, no numbness, no tingling, no dizziness Psychiatric: no depression, no anxiety   Objective:    There were no vitals taken for this visit.  Wt Readings from Last 3 Encounters:  02/12/20 196 lb (88.9 kg)  10/13/19 198 lb (89.8 kg)  07/15/19 194 lb (88 kg)    BP Readings from Last 3 Encounters:  02/12/20 (!) 196/74  10/13/19 (!) 175/73  07/15/19 (!) 160/85    Physical Exam- Telehealth- significantly limited due to nature of visit  Constitutional: There is no height or weight on file to calculate BMI. , not in acute distress, normal state of mind Respiratory: Adequate breathing  efforts   CMP     Component Value Date/Time   NA 141 06/09/2020 0802   K 4.2 06/09/2020 0802   CL 107 (H) 06/09/2020 0802   CO2 22 06/09/2020 0802   GLUCOSE 126 (H) 06/09/2020 0802   GLUCOSE 119 (H) 05/10/2019 0705   BUN 16 06/09/2020 0802   CREATININE 1.26 (H) 06/09/2020 0802   CREATININE 1.40 (H) 07/01/2015 0926   CALCIUM 9.8 06/09/2020 0802   PROT 6.3 06/09/2020 0802   ALBUMIN 3.9 06/09/2020 0802   AST 41 (H) 06/09/2020 0802   ALT 31 06/09/2020 0802   ALKPHOS 97 06/09/2020 0802   BILITOT 0.2 06/09/2020 0802   GFRNONAA 42 (L) 06/09/2020 0802   GFRAA 48 (L) 06/09/2020 0802    Diabetic Labs (most recent): Lab Results  Component Value Date   HGBA1C 8.1 (H) 06/09/2020   HGBA1C 8.5 (A) 02/12/2020   HGBA1C 8.3 (A) 10/13/2019     Lipid Panel     Component Value Date/Time   CHOL 128 06/09/2020 0802   TRIG 77 06/09/2020 0802   HDL 61 06/09/2020 0802   CHOLHDL 2.1  06/09/2020 0802   CHOLHDL 2.5 05/04/2019 2350   VLDL 20 05/04/2019 2350   LDLCALC 52 06/09/2020 0802    Assessment & Plan:   1) DM type 2 causing vascular disease ([place])  - Cassandra Adams has currently uncontrolled symptomatic type 2 DM since  74 years of age.  She presents today, accompanied by her daughter, for her virtual visit with her meter and logs available for review.  Her previsit A1c was 8.1%, improving from last visit of 8.5%.  Her last few days of readings are as follows: 12/21: 62, 233 12/20: 296, 87, 396, 164 12/19: 182, 216, 331, 121 12/18: 104, 114, 146, 47 12/17:  263, 286, 281, 397 12/16: 113, skip, 209, 204 12/15: 126, 250, 308, 82 12/14: 142, 126, 153, 213 12/13: 189, 151, 240, 435  -her diabetes is complicated by coronary artery disease, carotid  atherosclerosis, renal insufficiency, retinopathy, peripheral neuropathy and [place] remains at a high risk for more acute and chronic complications which include CAD, CVA, CKD, retinopathy, and neuropathy. These are all discussed in detail with the patient.  - The patient admits there is a room for improvement in their diet and drink choices. -  Suggestion is made for the patient to avoid simple carbohydrates from their diet including Cakes, Sweet Desserts / Pastries, Ice Cream, Soda (diet and regular), Sweet Tea, Candies, Chips, Cookies, Sweet Pastries,  Store Bought Juices, Alcohol in Excess of  1-2 drinks a day, Artificial Sweeteners, Coffee Creamer, and "Sugar-free" Products. This will help patient to have stable blood glucose profile and potentially avoid unintended weight gain.   - I encouraged the patient to switch to  unprocessed or minimally processed complex starch and increased protein intake (animal or plant source), fruits, and vegetables.   - Patient is advised to stick to a routine mealtimes to eat 3 meals  a day and avoid unnecessary snacks ( to snack only to correct hypoglycemia).  -  she has seen Jearld Fenton, RDN, CDE for individualized diabetes education.  - I have approached her with the following individualized plan to manage diabetes and patient agrees:   -Based on her glycemic response, she will continue to need intensive treatment with readjusted basal/bolus insulin in order for her to achieve and maintain control of diabetes to target.   -Priority #1 in the care of her diabetes  will be to avoid hypoglycemia.  -Given her fasting hyperglycemia, she is advised to increase her dose of Basaglar to 35 units SQ at bedtime, and continue on current dose of Humalog 5-8 units if glucose above 90 and eating.  Specific instructions on how to titrate dose based off of blood sugar readings given to patient and daughter in writing.  Advice given to patient and daughter regarding timing of meals to avoid hypoglycemia.  She has done well with reduced dose of Glipizide 2.5 mg XL po daily with breakfast, and is advised to continue.  -She is instructed to continue monitoring blood glucose 4 times a day, before meals and at bedtime and report to the clinic if blood glucose is less than 70 or above 200 for 3 tests in a row.  - Patient is warned not to take insulin without proper monitoring per orders.  -Patient is not a candidate for metformin,SGLT2 inhibitors due to CKD.  - she will be considered for incretin therapy as appropriate next visit. - Patient specific target  A1c;  LDL, HDL, Triglycerides, and  were discussed in detail.  2) BP/HTN: Her blood pressure is not controlled to target according to previous visit readings.  She is advised to continue Coreg 6.25 mg p.o. twice daily, Lasix 40 mg p.o. daily, Hydralazine 25 mg p.o. 3 times daily, and Lisinopril 10 mg p.o. daily.  3) Lipids/HPL:  Her most recent lipid panel from 06/09/20 shows controlled LDL of 52.  She is advised to continue Lipitor 80 mg po daily at bedtime.  Side effects and precautions discussed with her.  4)   Weight/Diet:  Her There is no height or weight on file to calculate BMI.  She is a candidate for modest weight loss.  CDE Consult will be initiated , exercise, and detailed carbohydrates information provided.  5) Chronic Care/Health Maintenance: -she  is on ACEI/ARB and Statin medications and  is encouraged to continue to follow up with Ophthalmology, Dentist,  Podiatrist at least yearly or according to recommendations, and advised to  stay away from smoking. I have recommended yearly flu vaccine and pneumonia vaccination at least every 5 years; moderate intensity exercise for up to 150 minutes weekly; and  sleep for at least 7 hours a day.  - I advised patient to maintain close follow up with Redmond School, MD for primary care needs.    - Time spent on this patient care encounter:  35 min, of which > 50% was spent in  counseling and the rest reviewing her blood glucose logs , discussing her hypoglycemia and hyperglycemia episodes, reviewing her current and  previous labs / studies  ( including abstraction from other facilities) and medications  doses and developing a  long term treatment plan and documenting her care.   Please refer to Patient Instructions for Blood Glucose Monitoring and Insulin/Medications Dosing Guide"  in media tab for additional information. Please  also refer to " Patient Self Inventory" in the Media  tab for reviewed elements of pertinent patient history.  Cassandra Adams participated in the discussions, expressed understanding, and voiced agreement with the above plans.  All questions were answered to her satisfaction. she is encouraged to contact clinic should she have any questions or concerns prior to her return visit.    Follow up plan: - Return in about 4 months (around 10/14/2020) for Diabetes follow up with A1c in office, No previsit labs, Bring glucometer and logs, ABI next visit.  Rayetta Pigg, FNP-BC [facility] [address] [place], [place] [address] Phone: [phone] Fax: [phone]   06/15/2020, 2:36 PM

## 2020-06-16 DIAGNOSIS — R69 Illness, unspecified: Secondary | ICD-10-CM | POA: Diagnosis not present

## 2020-06-25 DIAGNOSIS — M1991 Primary osteoarthritis, unspecified site: Secondary | ICD-10-CM | POA: Diagnosis not present

## 2020-06-25 DIAGNOSIS — E6609 Other obesity due to excess calories: Secondary | ICD-10-CM | POA: Diagnosis not present

## 2020-06-25 DIAGNOSIS — I10 Essential (primary) hypertension: Secondary | ICD-10-CM | POA: Diagnosis not present

## 2020-06-25 DIAGNOSIS — E1140 Type 2 diabetes mellitus with diabetic neuropathy, unspecified: Secondary | ICD-10-CM | POA: Diagnosis not present

## 2020-07-08 ENCOUNTER — Encounter: Payer: Self-pay | Admitting: Internal Medicine

## 2020-07-08 ENCOUNTER — Telehealth: Payer: Self-pay | Admitting: Internal Medicine

## 2020-07-08 ENCOUNTER — Telehealth (INDEPENDENT_AMBULATORY_CARE_PROVIDER_SITE_OTHER): Payer: Medicare HMO | Admitting: Internal Medicine

## 2020-07-08 VITALS — BP 150/72 | HR 58 | Ht 66.0 in | Wt 200.0 lb

## 2020-07-08 DIAGNOSIS — I10 Essential (primary) hypertension: Secondary | ICD-10-CM | POA: Diagnosis not present

## 2020-07-08 DIAGNOSIS — I480 Paroxysmal atrial fibrillation: Secondary | ICD-10-CM | POA: Diagnosis not present

## 2020-07-08 DIAGNOSIS — I779 Disorder of arteries and arterioles, unspecified: Secondary | ICD-10-CM | POA: Diagnosis not present

## 2020-07-08 DIAGNOSIS — I2583 Coronary atherosclerosis due to lipid rich plaque: Secondary | ICD-10-CM | POA: Diagnosis not present

## 2020-07-08 DIAGNOSIS — E559 Vitamin D deficiency, unspecified: Secondary | ICD-10-CM | POA: Diagnosis not present

## 2020-07-08 DIAGNOSIS — E785 Hyperlipidemia, unspecified: Secondary | ICD-10-CM

## 2020-07-08 DIAGNOSIS — I2510 Atherosclerotic heart disease of native coronary artery without angina pectoris: Secondary | ICD-10-CM | POA: Diagnosis not present

## 2020-07-08 MED ORDER — LISINOPRIL 20 MG PO TABS: 20.0000 mg | ORAL_TABLET | Freq: Every day | ORAL | 3 refills | Status: DC

## 2020-07-08 MED ORDER — VITAMIN D (ERGOCALCIFEROL) 1.25 MG (50000 UNIT) PO CAPS: 50000.0000 [IU] | ORAL_CAPSULE | ORAL | 0 refills | Status: DC

## 2020-07-08 NOTE — Telephone Encounter (Signed)
Patient called to review e-visit instructions. Patient aware that the following changes have been made: increase lisinopril to 20mg , daily, start vit d 50,000 units once/week for 6 weeks and then take 2000 units daily Patient aware that they will need the following labs: vit d in 2 months Patient aware that they will need the following test(s): carotid doppler  Recall for N/A entered.  Scheduler to call for appointments  No further assistance needed at this time.

## 2020-07-08 NOTE — Progress Notes (Signed)
Virtual Visit via Telephone Note   This visit type was conducted due to national recommendations for restrictions regarding the COVID-19 Pandemic (e.g. social distancing) in an effort to limit this patient's exposure and mitigate transmission in our community.  Due to her co-morbid illnesses, this patient is at least at moderate risk for complications without adequate follow up.  This format is felt to be most appropriate for this patient at this time.  The patient did not have access to video technology/had technical difficulties with video requiring transitioning to audio format only (telephone).  All issues noted in this document were discussed and addressed.  No physical exam could be performed with this format.  Please refer to the patient's chart for her  consent to telehealth for [facility].   Date:  07/08/2020   ID:  Cassandra Adams, DOB Oct 17, 1945, MRN [id number] The patient was identified using 2 identifiers.  Evaluation Performed:  Follow-Up Visit  Patient Location:  [address] [place] [address]  Provider location:   [address], [place], [place] [address]  PCP:  Redmond School, MD  Cardiologist:  No primary care provider on file. Electrophysiologist:  None   Chief Complaint:  Follow-up  History of Present Illness:    Cassandra Adams is a 75 y.o. female who presents via audio/video conferencing for a telehealth visit today.  The patient is a 75 year old African American female with a history of diabetes, hypertension, prior stroke but no prior history of CAD, who initially presented to [facility] on 01/03/2014 with a complaint of chest pain. Initial troponin was elevated at 15.7 and EKG was with nonspecific ST-T changes. She was placed on IV heparin and IV nitroglycerin and was transferred to [facility] for further treatment. Her chest pain resolved. She underwent a diagnostic left heart catheterization, perform by Dr.  Gwenlyn Found which demonstrated occlusion of the midportion of the RCA, which was felt to be the infarct-related artery. However, she had grade 3 left right collaterals. She also has moderate LAD artery disease with 50% segmental stenosis in the proximal portion with a focal area of 60-70% stenosis. The left main and left circumflex were both normal. Left ventricular systolic function was normal with an estimated ejection fraction of 60%. Wall motion abnormalities were notable for sublte inferior basal hypokinesia. Given her lack of ongoing symptoms and preserved LV function with excellent collaterals, it was recommended to treat with medical therapy. It was felt that if she were to develop recurrent chest pain then she would be a candidate for PCI and stenting of her RCA. She left the Cath Lab in stable condition and had no post-cath complications and no recurrent chest pain. She was placed on dual antiplatelet therapy with aspirin plus Plavix. She was also placed on a beta blocker, an ACE inhibitor and statin. Also notable this admission, was a hemoglobin A1c level of 8.1. She was instructed to followup with her PCP for better management of her diabetes.  She was discharged home on 01/06/2014.  She presents to clinic today for post hospital followup. She is accompanied by her daughter. She denies any recurrent anginal symptoms. She also denies dyspnea, syncope/near-syncope. Yesterday however, she did note feeling a sensation of increased warmth and mild dizziness, that was self-limiting. This lasted only a minute or 2. Again, she had no syncope/near-syncope. She denies any further recurrence. She has been fully compliant with her medications.  Cassandra Adams is seen in followup today. Her  EKG demonstrates a sinus rhythm today. However I suspect that she continues to have PAF. Unfortunate she took herself off of Xarelto after experiencing some oral bleeding. She did not notify the office and has not been on any  anticoagulation for several months. We discussed her higher than normal risk of stroke, based on a CHADSVASC score of 5. She reports only one episode of angina in January, however it was very short-lived and sharp pain. She took a nitroglycerin and the symptoms went away after 10 or 15 minutes, suggesting this may not eventually been angina.  I saw Cassandra Adams today back in the office. Overall she is feeling well. She denies any chest pain or shortness of breath. She's not had any further episodes of A. fib. She is noted to be mildly bradycardic today. Blood pressure has run somewhat high for a while.  02/18/2016  Cassandra Adams returns today for follow-up. She reports she's been having some episodes of what sounds like night sweats. She wakes up at night fairly soaked. These don't happen every night and is been no associated weight loss, change in appetite or other constitutional symptoms. She reports she is long past her menopause symptoms. She continues to have morning hypoglycemia with blood sugars in the 40s. I previously asked to decrease her Lantus insulin. She says that her endocrinologist is aware of this and she has an appointment with him in September. She is not checking her sugars in the middle the night but I'm suspicious of hypoglycemia. Other possible causes would include autonomic dysfunction, she does have gastroparesis and peripheral neuropathy. In addition she could be having other symptoms such as reflux or other things which cause vagal stimulation and diaphoresis. This is very atypical for coronary disease and there is no associated chest pain or worsening shortness of breath.  03/30/2016  Cassandra Adams was seen back today for follow-up. She reports her hot flashes have resolved. Unfortunately even though we decreased her insulin, she had some recurrent hypoglycemia and ended up in the emergency department. She is now having problems with hyperglycemia. They're planning to  follow-up with their primary care provider however suspect she is likely a brittle diabetic. She denies any chest pain or worsening shortness of breath. She does have some chronic lower extremity edema which is likely from neuropathy. I've recommended bilateral knee-high 20-30 mmHg compression stockings.  11/24/2016  Mrs. Templeman returns for follow-up. She reported December she had to take one nitroglycerin. She was very active that day and it seemed to resolve her symptoms fairly quickly. She's not required any more nitroglycerin. She is asking for refills of that today. She continues to have problems with hypoglycemia although does seem she has awareness. Recently she had a few episodes of spontaneous diaphoresis. This was not associated with any chest pain and testing of her blood sugar indicated hypoglycemia so I suspect that this is more hypoglycemia awareness. She reports some lower strandy swelling which we discussed in the past would be amenable to compression stockings. She is on daily Lasix. Blood pressure was initially elevated 158/62 of her came out a 134/60. She has managed to lose 10 pounds since I last saw her which I commended her on as well.  11/26/2017  Mrs. Osterberg returns today for routine follow-up.  Overall she is doing well.  She has not required any nitroglycerin since I last saw her.  She denies any bleeding problems on Eliquis.  She is out of some medications including atorvastatin and nearly out  of Eliquis.  She also lost her nitroglycerin and needs a refill.  She reports reasonably good blood sugar control.  Her blood pressure has remained elevated.  Recently her PCP started her on losartan, however she ready was on lisinopril.  She is not on any additional blood pressure medications.  Blood pressure today was 169/82.  Given her history of coronary disease, she would be a good candidate for beta-blocker.  EKG shows sinus rhythm with PVCs in fact today and nonspecific T wave  changes at 80.  07/03/2019  Mrs. Baratta returns today for follow-up.  Over the past year she has done well.  She denies any chest pain or worsening shortness of breath.  Blood pressure was very elevated today at 208/98 however she was rushed to get in the office and was upset that her daughter could not come back with her.  I repeated her blood pressure eventually did come down to 132/68.  EKG shows sinus rhythm with some PACs today.  Recent labs from November showed total cholesterol 128, HDL 51, LDL 57 triglycerides 99.  She is working with Dr. Dorris Fetch regarding her diabetes and her hemoglobin A1c is still elevated 8.3.  07/08/2020  Mrs. Kuhlmann seen today in follow-up.  She denies any chest pain or worsening shortness of breath.  She has generally been isolating due to COVID-19.  Recently she saw her endocrinologist via a telehealth visit.  This was actually the nurse practitioner.  A number of labs were ordered in mid December which indicated improvement in her hemoglobin A1c from 8.3-8.1.  Interestingly her vitamin D level was extraordinarily low at 8.1, however this was apparently not communicated to Ms. Dols and she is not on any treatment for this.  She has had good control of her lipids with an LDL less than 70.  She denies any chest pain or worsening shortness of breath.  She does have bilateral carotid artery disease.  The patient does not have symptoms concerning for COVID-19 infection (fever, chills, cough, or new SHORTNESS OF BREATH).    Prior CV studies:   The following studies were reviewed today:  Chart reviewed  PMHx:  Past Medical History:  Diagnosis Date  . Arthritis    "all over"  . CAD (coronary artery disease)    a. NSTEMI 12/2013 - occluded dominant RCA with L-R collaterals, moderate prox segmental LAD disease, for medical therapy initially, EF 60% with subtle inferobasal hypokinesia. Consider PCI for refractory CP.  [name] CKD (chronic kidney disease), stage III  ([place])   . Hypertension   . Migraines    "weekly" (01/06/2014)  . Sickle cell trait ([place])   . TIA (transient ischemic attack) 1978  . Type II diabetes mellitus ([place])   . Vertigo     Past Surgical History:  Procedure Laterality Date  . APPENDECTOMY  March 2014   had appendix frozen  . CARDIAC CATHETERIZATION  "years ago" & 01/05/2014  . CARDIOVERSION N/A 03/17/2014   Procedure: CARDIOVERSION;  Surgeon: [name], MD;  Location: [facility] ENDOSCOPY;  Service: Cardiovascular;  Laterality: N/A;  . CATARACT EXTRACTION W/ INTRAOCULAR LENS  IMPLANT, BILATERAL Bilateral 04/2013-05/2013  . ENDARTERECTOMY Right 02/05/2017   Procedure: ENDARTERECTOMY CAROTID-RIGHT;  Surgeon: [name], MD;  Location[facility];  Service: Vascular;  Laterality: Right;  . ESOPHAGOGASTRODUODENOSCOPY  11/08/2007    Normal esophagus without evidence of Barrett, mass, erosion/ Normal stomach, duodenal bulb  . GASTRIC MOTILITY STUDY  11/13/2007   mildly delayed emptying subjectively, but normal  analysis-77% of tracer emptied at 2 hours  . JOINT REPLACEMENT    . LAPAROSCOPIC APPENDECTOMY N/A 09/15/2012   Procedure: APPENDECTOMY LAPAROSCOPIC;  Surgeon: [name], MD;  Location: [facility] ORS;  Service: General;  Laterality: N/A;  . LEFT HEART CATHETERIZATION WITH CORONARY ANGIOGRAM N/A 01/05/2014   Procedure: LEFT HEART CATHETERIZATION WITH CORONARY ANGIOGRAM;  Surgeon: [name], MD;  Location: [facility] CATH LAB;  Service: Cardiovascular;  Laterality: N/A;  . REPLACEMENT TOTAL KNEE Right 05-03-06  . TEE WITHOUT CARDIOVERSION N/A 03/17/2014   Procedure: TRANSESOPHAGEAL ECHOCARDIOGRAM (TEE);  Surgeon: [name], MD;  Location: [facility] ENDOSCOPY;  Service: Cardiovascular;  Laterality: N/A;  . TONSILLECTOMY  1972    FAMHx:  Family History  Problem Relation Age of Onset  . Hyperlipidemia Mother   . Hypertension Mother   . Heart attack Mother   . Cancer Father        lung  . Hypertension Sister   . Diabetes Brother    . Heart disease Brother   . Hyperlipidemia Brother   . Hypertension Brother   . Heart attack Brother   . Other Brother        DVT    SOCHx:   reports that she has never smoked. She has never used smokeless tobacco. She reports that she does not drink alcohol and does not use drugs.  ALLERGIES:  Allergies  Allergen Reactions  . Penicillins Hives    Has patient had a PCN reaction causing immediate rash, facial/tongue/throat swelling, SOB or lightheadedness with hypotension: no Has patient had a PCN reaction causing severe rash involving mucus membranes or skin necrosis: No  Has patient had a PCN reaction that required hospitalization: no Has patient had a PCN reaction occurring within the last 10 years: no If all of the above answers are "NO", then may proceed with Cephalosporin use.     MEDS:  Current Meds  Medication Sig  . aspirin EC 81 MG tablet Take 81 mg by mouth daily.  [name] atorvastatin (LIPITOR) 80 MG tablet Take 1 tablet (80 mg total) by mouth every evening.  . carvedilol (COREG) 3.125 MG tablet Take 3.125 mg by mouth 2 (two) times daily with a meal.  . colesevelam (WELCHOL) 625 MG tablet Take 1,875 mg by mouth 2 (two) times daily with a meal. *May take 6 tablets once a day with meals*  . ELIQUIS 5 MG TABS tablet TAKE 1 TABLET BY MOUTH TWICE A DAY  . furosemide (LASIX) 40 MG tablet Take 40 mg by mouth daily.   [name] glipiZIDE (GLUCOTROL XL) 2.5 MG 24 hr tablet Take 1 tablet (2.5 mg total) by mouth daily with breakfast.  . Insulin Glargine (BASAGLAR KWIKPEN) 100 UNIT/ML Inject 35 Units into the skin at bedtime.  . insulin lispro (HUMALOG) 100 UNIT/ML injection Inject 5-8 Units into the skin 3 (three) times daily before meals.  . nitroGLYCERIN (NITROSTAT) 0.4 MG SL tablet Place 1 tablet (0.4 mg total) under the tongue every 5 (five) minutes as needed for chest pain (up to 3 doses).  . Vitamin D, Cholecalciferol, 25 MCG (1000 UT) TABS Take 2,000 Units by mouth daily. Start after  you finish 6 weeks of vit D 50,000  . Vitamin D, Ergocalciferol, (DRISDOL) 1.25 MG (50000 UNIT) CAPS capsule Take 1 capsule (50,000 Units total) by mouth every 7 (seven) days.  . [DISCONTINUED] lisinopril (ZESTRIL) 10 MG tablet Take 10 mg by mouth daily.      ROS: Pertinent items noted in HPI and  remainder of comprehensive ROS otherwise negative.  Labs/Other Tests and Data Reviewed:    Recent Labs: 06/09/2020: ALT 31; BUN 16; Creatinine, Ser 1.26; Potassium 4.2; Sodium 141; TSH 1.660   Recent Lipid Panel Lab Results  Component Value Date/Time   CHOL 128 06/09/2020 08:02 AM   TRIG 77 06/09/2020 08:02 AM   HDL 61 06/09/2020 08:02 AM   CHOLHDL 2.1 06/09/2020 08:02 AM   CHOLHDL 2.5 05/04/2019 11:50 PM   LDLCALC 52 06/09/2020 08:02 AM    Wt Readings from Last 3 Encounters:  07/08/20 200 lb (90.7 kg)  02/12/20 196 lb (88.9 kg)  10/13/19 198 lb (89.8 kg)     Exam:    Vital Signs:  BP (!) 150/72   Pulse (!) 58   Ht 5\' 6"  (1.676 m)   Wt 200 lb (90.7 kg)   BMI 32.28 kg/m    Exam not performed due to telephone visit  ASSESSMENT & PLAN:    1. CAD status post PCI to the RCA in 2015 2. PAF - CHADSVASC score of 5 on Eliquis and ASA for CAD 3. Hypertension 4. Dyslipidemia 5. IDDM with neuropathy - A1C 8.1 6. Carotid artery stenosis/occlusion - mild 2016 7. Leg edema 8. Vitamin D deficiency  Ms. Neeb seems to be doing well and is asymptomatic.  She denies any chest pain or worsening shortness of breath.  She has had no recurrent A. fib.  Blood pressure is elevated at 150/72.  She says this is pretty typical for her.  She is only on 10 mg of lisinopril and low-dose carvedilol 3.125 mg twice daily.  I advised her to increase her lisinopril to 20 mg daily.  Based on her significantly low vitamin D level, I will start vitamin D 50,000 units weekly for 6 weeks and then 2000 units daily thereafter.  I will plan to repeat vitamin D level in about 3 months and follow-up with me at  that time.  Also in the interim we will schedule her for carotid Dopplers which are well overdue having had her last studies in 2018.  She had moderate bilateral carotid artery disease at that time and that may have progressed.  COVID-19 Education: The signs and symptoms of COVID-19 were discussed with the patient and how to seek care for testing (follow up with PCP or arrange E-visit).  The importance of social distancing was discussed today.  Patient Risk:   After full review of this patients clinical status, I feel that they are at least moderate risk at this time.  Time:   Today, I have spent 25 minutes with the patient with telehealth technology discussing coronary disease, A. fib, hypertension, dyslipidemia, insulin-dependent diabetes, carotid artery disease and vitamin D deficiency.     Medication Adjustments/Labs and Tests Ordered: Current medicines are reviewed at length with the patient today.  Concerns regarding medicines are outlined above.   Tests Ordered: Orders Placed This Encounter  Procedures  . Vitamin D (25 hydroxy)  . VAS US CAROTID    Medication Changes: Meds ordered this encounter  Medications  . Vitamin D, Ergocalciferol, (DRISDOL) 1.25 MG (50000 UNIT) CAPS capsule    Sig: Take 1 capsule (50,000 Units total) by mouth every 7 (seven) days.    Dispense:  6 capsule    Refill:  0  . lisinopril (ZESTRIL) 20 MG tablet    Sig: Take 1 tablet (20 mg total) by mouth daily.    Dispense:  90 tablet    Refill:  3  Disposition:  in 3 month(s)  [name], MD, [facility] [place] Director of the Advanced Lipid Disorders &  Cardiovascular Risk Reduction Clinic Diplomate of the American Board of Clinical Lipidology Attending Cardiologist  Direct Dial: [phone]  Fax: [phone]  Website:  [email or web address]  [name], MD  07/08/2020 7:45 PM

## 2020-07-08 NOTE — Patient Instructions (Addendum)
Medication Instructions:  INCREASE lisinopril to 20mg  daily START vitamin D 50,000 units once weekly - take for 6 weeks Then START over-the-counter vitamin D 2,000 units every day CONTINUE all other current medications  *If you need a refill on your cardiac medications before your next appointment, please call your pharmacy*   Lab Work: Vitamin D lab test in 3 months  If you have labs (blood work) drawn today and your tests are completely normal, you will receive your results only by: [name] MyChart Message (if you have MyChart) OR . A paper copy in the mail If you have any lab test that is abnormal or we need to change your treatment, we will call you to review the results.   Testing/Procedures: Carotid Doppler @ Dr. Lysbeth Penner office    Follow-Up: At [facility], you and your health needs are our priority.  As part of our continuing mission to provide you with exceptional heart care, we have created designated Provider Care Teams.  These Care Teams include your primary Cardiologist (physician) and Advanced Practice Providers (APPs -  Physician Assistants and Nurse Practitioners) who all work together to provide you with the care you need, when you need it.  We recommend signing up for the patient portal called "MyChart".  Sign up information is provided on this After Visit Summary.  MyChart is used to connect with patients for Virtual Visits (Telemedicine).  Patients are able to view lab/test results, encounter notes, upcoming appointments, etc.  Non-urgent messages can be sent to your provider as well.   To learn more about what you can do with MyChart, go to [email or web address].    Your next appointment:   3 month(s)  The format for your next appointment:   In Person  Provider:   You may see Dr. Dagoberto Ligas or one of the following Advanced Practice Providers on your designated Care Team:    Almyra Deforest, PA-C  Fabian Sharp, [place] or   Roby Lofts, [place]    Other  Instructions

## 2020-07-20 ENCOUNTER — Inpatient Hospital Stay (HOSPITAL_COMMUNITY): Admission: RE | Admit: 2020-07-20 | Payer: Medicare HMO | Source: Ambulatory Visit

## 2020-07-26 DIAGNOSIS — I10 Essential (primary) hypertension: Secondary | ICD-10-CM | POA: Diagnosis not present

## 2020-07-26 DIAGNOSIS — K219 Gastro-esophageal reflux disease without esophagitis: Secondary | ICD-10-CM | POA: Diagnosis not present

## 2020-07-26 DIAGNOSIS — E1140 Type 2 diabetes mellitus with diabetic neuropathy, unspecified: Secondary | ICD-10-CM | POA: Diagnosis not present

## 2020-07-28 ENCOUNTER — Other Ambulatory Visit: Payer: Self-pay | Admitting: Internal Medicine

## 2020-07-28 ENCOUNTER — Other Ambulatory Visit: Payer: Self-pay

## 2020-07-28 ENCOUNTER — Ambulatory Visit (HOSPITAL_COMMUNITY)
Admission: RE | Admit: 2020-07-28 | Discharge: 2020-07-28 | Disposition: A | Discharge status: Home / Self Care | Payer: Medicare HMO | Source: Ambulatory Visit | Attending: Cardiovascular Disease | Admitting: Cardiovascular Disease

## 2020-07-28 DIAGNOSIS — I779 Disorder of arteries and arterioles, unspecified: Secondary | ICD-10-CM | POA: Diagnosis not present

## 2020-07-28 DIAGNOSIS — Z9889 Other specified postprocedural states: Secondary | ICD-10-CM

## 2020-08-03 ENCOUNTER — Encounter: Payer: Self-pay | Admitting: Internal Medicine

## 2020-09-22 DIAGNOSIS — K219 Gastro-esophageal reflux disease without esophagitis: Secondary | ICD-10-CM | POA: Diagnosis not present

## 2020-09-22 DIAGNOSIS — E1140 Type 2 diabetes mellitus with diabetic neuropathy, unspecified: Secondary | ICD-10-CM | POA: Diagnosis not present

## 2020-09-22 DIAGNOSIS — I10 Essential (primary) hypertension: Secondary | ICD-10-CM | POA: Diagnosis not present

## 2020-10-13 NOTE — Patient Instructions (Signed)

## 2020-10-14 ENCOUNTER — Other Ambulatory Visit: Payer: Self-pay

## 2020-10-14 ENCOUNTER — Ambulatory Visit: Payer: Medicare HMO | Admitting: Nurse Practitioner

## 2020-10-14 ENCOUNTER — Encounter: Payer: Self-pay | Admitting: Nurse Practitioner

## 2020-10-14 VITALS — BP 177/91 | HR 106 | Ht 66.0 in | Wt 182.8 lb

## 2020-10-14 DIAGNOSIS — E559 Vitamin D deficiency, unspecified: Secondary | ICD-10-CM

## 2020-10-14 DIAGNOSIS — I10 Essential (primary) hypertension: Secondary | ICD-10-CM | POA: Diagnosis not present

## 2020-10-14 DIAGNOSIS — N1831 Chronic kidney disease, stage 3a: Secondary | ICD-10-CM

## 2020-10-14 DIAGNOSIS — Z794 Long term (current) use of insulin: Secondary | ICD-10-CM | POA: Diagnosis not present

## 2020-10-14 DIAGNOSIS — E1151 Type 2 diabetes mellitus with diabetic peripheral angiopathy without gangrene: Secondary | ICD-10-CM | POA: Diagnosis not present

## 2020-10-14 DIAGNOSIS — E1122 Type 2 diabetes mellitus with diabetic chronic kidney disease: Secondary | ICD-10-CM

## 2020-10-14 DIAGNOSIS — L84 Corns and callosities: Secondary | ICD-10-CM | POA: Diagnosis not present

## 2020-10-14 DIAGNOSIS — L603 Nail dystrophy: Secondary | ICD-10-CM | POA: Diagnosis not present

## 2020-10-14 DIAGNOSIS — I739 Peripheral vascular disease, unspecified: Secondary | ICD-10-CM | POA: Diagnosis not present

## 2020-10-14 LAB — POCT GLYCOSYLATED HEMOGLOBIN (HGB A1C): HbA1c, POC (controlled diabetic range): 8.1 % — AB (ref 0.0–7.0)

## 2020-10-14 MED ORDER — BASAGLAR KWIKPEN 100 UNIT/ML ~~LOC~~ SOPN: 32.0000 [IU] | PEN_INJECTOR | Freq: Every day | SUBCUTANEOUS | 2 refills | Status: DC

## 2020-10-14 NOTE — Progress Notes (Signed)
10/14/2020, 9:54 AM     Endocrinology follow-up note     Subjective:    Patient ID: Cassandra Adams, female    DOB: 12-22-1945.  Cassandra Adams is being seen in follow-up  for management of currently uncontrolled symptomatic diabetes requested by  Redmond School, MD.   Past Medical History:  Diagnosis Date  . Arthritis    "all over"  . CAD (coronary artery disease)    a. NSTEMI 12/2013 - occluded dominant RCA with L-R collaterals, moderate prox segmental LAD disease, for medical therapy initially, EF 60% with subtle inferobasal hypokinesia. Consider PCI for refractory CP.  [name] CKD (chronic kidney disease), stage III ([place])   . Hypertension   . Migraines    "weekly" (01/06/2014)  . Sickle cell trait ([place])   . TIA (transient ischemic attack) 1978  . Type II diabetes mellitus ([place])   . Vertigo    Past Surgical History:  Procedure Laterality Date  . APPENDECTOMY  March 2014   had appendix frozen  . CARDIAC CATHETERIZATION  "years ago" & 01/05/2014  . CARDIOVERSION N/A 03/17/2014   Procedure: CARDIOVERSION;  Surgeon: [name], MD;  Location[facility] ENDOSCOPY;  Service: Cardiovascular;  Laterality: N/A;  . CATARACT EXTRACTION W/ INTRAOCULAR LENS  IMPLANT, BILATERAL Bilateral 04/2013-05/2013  . ENDARTERECTOMY Right 02/05/2017   Procedure: ENDARTERECTOMY CAROTID-RIGHT;  Surgeon: [name], MD;  Location: [facility] OR;  Service: Vascular;  Laterality: Right;  . ESOPHAGOGASTRODUODENOSCOPY  11/08/2007    Normal esophagus without evidence of Barrett, mass, erosion/ Normal stomach, duodenal bulb  . GASTRIC MOTILITY STUDY  11/13/2007   mildly delayed emptying subjectively, but normal  analysis-77% of tracer emptied at 2 hours  . JOINT REPLACEMENT    . LAPAROSCOPIC APPENDECTOMY N/A 09/15/2012   Procedure: APPENDECTOMY LAPAROSCOPIC;  Surgeon: [name], MD;  Location: [facility] ORS;  Service: General;  Laterality:  N/A;  . LEFT HEART CATHETERIZATION WITH CORONARY ANGIOGRAM N/A 01/05/2014   Procedure: LEFT HEART CATHETERIZATION WITH CORONARY ANGIOGRAM;  Surgeon: [name], MD;  Location: [facility] CATH LAB;  Service: Cardiovascular;  Laterality: N/A;  . REPLACEMENT TOTAL KNEE Right 05-03-06  . TEE WITHOUT CARDIOVERSION N/A 03/17/2014   Procedure: TRANSESOPHAGEAL ECHOCARDIOGRAM (TEE);  Surgeon: [name], MD;  Location: [facility] ENDOSCOPY;  Service: Cardiovascular;  Laterality: N/A;  . TONSILLECTOMY  1972   Social History   Socioeconomic History  . Marital status: Divorced    Spouse name: Not on file  . Number of children: Not on file  . Years of education: Not on file  . Highest education level: Not on file  Occupational History  . Not on file  Tobacco Use  . Smoking status: Never Smoker  . Smokeless tobacco: Never Used  Vaping Use  . Vaping Use: Never used  Substance and Sexual Activity  . Alcohol use: No  . Drug use: No  . Sexual activity: Not Currently    Birth control/protection: Post-menopausal  Other Topics Concern  . Not on file  Social History Narrative  . Not on file   Social Determinants of Health   Financial Resource Strain: Not on file  Food Insecurity: Not on file  Transportation Needs: Not on file  Physical  Activity: Not on file  Stress: Not on file  Social Connections: Not on file   Outpatient Encounter Medications as of 10/14/2020  Medication Sig  . aspirin EC 81 MG tablet Take 81 mg by mouth daily.  [name] atorvastatin (LIPITOR) 80 MG tablet Take 1 tablet (80 mg total) by mouth every evening.  . B-D ULTRAFINE III SHORT PEN 31G X 8 MM MISC SMARTSIG:Injection Daily  . BD INSULIN SYRINGE U/F 31G X 5/16" 0.5 ML MISC SMARTSIG:Injection 4 Times Daily  . carvedilol (COREG) 3.125 MG tablet Take 3.125 mg by mouth 2 (two) times daily with a meal.  . colesevelam (WELCHOL) 625 MG tablet Take 1,875 mg by mouth 2 (two) times daily with a meal. *May take 6 tablets once a day with  meals*  . ELIQUIS 5 MG TABS tablet TAKE 1 TABLET BY MOUTH TWICE A DAY  . furosemide (LASIX) 40 MG tablet Take 40 mg by mouth daily.   [name] glipiZIDE (GLUCOTROL XL) 2.5 MG 24 hr tablet Take 1 tablet (2.5 mg total) by mouth daily with breakfast.  . hydrALAZINE (APRESOLINE) 25 MG tablet Take 25 mg by mouth 3 (three) times daily.  [name] lisinopril (ZESTRIL) 20 MG tablet Take 1 tablet (20 mg total) by mouth daily.  . nitroGLYCERIN (NITROSTAT) 0.4 MG SL tablet Place 1 tablet (0.4 mg total) under the tongue every 5 (five) minutes as needed for chest pain (up to 3 doses).  . NOVOLOG 100 UNIT/ML injection Inject 6-9 Units into the skin 3 (three) times daily with meals.  Glory Rosebush ULTRA test strip   . Vitamin D, Cholecalciferol, 25 MCG (1000 UT) TABS Take 2,000 Units by mouth daily. Start after you finish 6 weeks of vit D 50,000  . [DISCONTINUED] Insulin Glargine (BASAGLAR KWIKPEN) 100 UNIT/ML Inject 35 Units into the skin at bedtime.  . Insulin Glargine (BASAGLAR KWIKPEN) 100 UNIT/ML Inject 32 Units into the skin at bedtime.  . [DISCONTINUED] insulin lispro (HUMALOG) 100 UNIT/ML injection Inject 5-8 Units into the skin 3 (three) times daily before meals.  . [DISCONTINUED] Vitamin D, Ergocalciferol, (DRISDOL) 1.25 MG (50000 UNIT) CAPS capsule Take 1 capsule (50,000 Units total) by mouth every 7 (seven) days.   No facility-administered encounter medications on file as of 10/14/2020.    ALLERGIES: Allergies  Allergen Reactions  . Penicillins Hives    Has patient had a PCN reaction causing immediate rash, facial/tongue/throat swelling, SOB or lightheadedness with hypotension: no Has patient had a PCN reaction causing severe rash involving mucus membranes or skin necrosis: No  Has patient had a PCN reaction that required hospitalization: no Has patient had a PCN reaction occurring within the last 10 years: no If all of the above answers are "NO", then may proceed with Cephalosporin use.     VACCINATION  STATUS:  There is no immunization history on file for this patient.  Diabetes She presents for her follow-up diabetic visit. She has type 2 diabetes mellitus. Onset time: She was diagnosed at approximate age of 16 years. Her disease course has been stable. Pertinent negatives for hypoglycemia include no confusion, headaches, pallor or seizures. Pertinent negatives for diabetes include no chest pain, no fatigue, no foot paresthesias, no foot ulcerations, no polydipsia, no polyphagia and no polyuria. There are no hypoglycemic complications. Symptoms are stable. Diabetic complications include heart disease, nephropathy, PVD and retinopathy. Risk factors for coronary artery disease include family history, dyslipidemia, diabetes mellitus, hypertension, sedentary lifestyle and post-menopausal. Current diabetic treatment includes insulin injections and oral agent (  monotherapy). She is compliant with treatment most of the time. Her weight is fluctuating dramatically. She is following a generally unhealthy diet. When asked about meal planning, she reported none. She has not had a previous visit with a dietitian. She participates in exercise intermittently. Her home blood glucose trend is fluctuating minimally. (She presents today, accompanied by her daughter, with her meter and logs showing stable glycemic profile overall with some tight fasting glucose numbers.  Her POCT A1c today is 8.1%, unchanged from previous visit.  She does tend to sleep late some days which may be contributing to her low numbers at times.) An ACE inhibitor/angiotensin II receptor blocker is being taken. She does not see a podiatrist.Eye exam is current (She has retinopathy status post multiple laser therapies on bilateral eyes.).  Hyperlipidemia This is a chronic problem. The current episode started more than 1 year ago. The problem is controlled. Recent lipid tests were reviewed and are normal. Exacerbating diseases include chronic renal  disease and diabetes. Factors aggravating her hyperlipidemia include fatty foods. Pertinent negatives include no chest pain, myalgias or shortness of breath. Current antihyperlipidemic treatment includes statins. The current treatment provides moderate improvement of lipids. Compliance problems include adherence to diet.  Risk factors for coronary artery disease include dyslipidemia, family history, hypertension, a sedentary lifestyle, post-menopausal and diabetes mellitus.  Hypertension This is a chronic problem. The current episode started more than 1 year ago. The problem has been waxing and waning since onset. The problem is controlled. Pertinent negatives include no chest pain, headaches, palpitations or shortness of breath. There are no associated agents to hypertension. Risk factors for coronary artery disease include dyslipidemia, diabetes mellitus, family history, sedentary lifestyle and post-menopausal state. Past treatments include ACE inhibitors, beta blockers and diuretics. The current treatment provides mild improvement. Compliance problems include diet.  Hypertensive end-organ damage includes kidney disease, CAD/MI, PVD and retinopathy. Identifiable causes of hypertension include chronic renal disease.    Review of systems  Constitutional: + Minimally fluctuating body weight,  current Body mass index is 29.5 kg/m. , no fatigue, no subjective hyperthermia, no subjective hypothermia Eyes: no blurry vision, no xerophthalmia ENT: no sore throat, no nodules palpated in throat, no dysphagia/odynophagia, no hoarseness Cardiovascular: no chest pain, no shortness of breath, no palpitations, no leg swelling Respiratory: no cough, no shortness of breath Gastrointestinal: no nausea/vomiting/diarrhea Musculoskeletal: no muscle/joint aches Skin: no rashes, no hyperemia Neurological: no tremors, no numbness, no tingling, no dizziness Psychiatric: no depression, no anxiety   Objective:    BP (!)  177/91 (BP Location: Right Arm)   Pulse (!) 106   Ht 5\' 6"  (1.676 m)   Wt 182 lb 12.8 oz (82.9 kg)   BMI 29.50 kg/m   Wt Readings from Last 3 Encounters:  10/14/20 182 lb 12.8 oz (82.9 kg)  07/08/20 200 lb (90.7 kg)  02/12/20 196 lb (88.9 kg)    BP Readings from Last 3 Encounters:  10/14/20 (!) 177/91  07/08/20 (!) 150/72  02/12/20 (!) 196/74     Physical Exam- Limited  Constitutional:  Body mass index is 29.5 kg/m. , not in acute distress, normal state of mind Eyes:  EOMI, no exophthalmos Neck: Supple Thyroid: No gross goiter Cardiovascular: RRR, no murmers, rubs, or gallops, no edema Respiratory: Adequate breathing efforts, no crackles, rales, rhonchi, or wheezing Musculoskeletal: no gross deformities, strength intact in all four extremities, no gross restriction of joint movements Skin:  no rashes, no hyperemia Neurological: no tremor with outstretched hands  POCT  ABI Results 10/14/20   Right ABI:  1.21      Left ABI:  1.20  Right leg systolic / diastolic: 229/798 mmHg Left leg systolic / diastolic: 921/194 mmHg  Arm systolic / diastolic: 174/08 mmHG  Detailed report will be scanned into patient chart.    CMP     Component Value Date/Time   NA 141 06/09/2020 0802   K 4.2 06/09/2020 0802   CL 107 (H) 06/09/2020 0802   CO2 22 06/09/2020 0802   GLUCOSE 126 (H) 06/09/2020 0802   GLUCOSE 119 (H) 05/10/2019 0705   BUN 16 06/09/2020 0802   CREATININE 1.26 (H) 06/09/2020 0802   CREATININE 1.40 (H) 07/01/2015 0926   CALCIUM 9.8 06/09/2020 0802   PROT 6.3 06/09/2020 0802   ALBUMIN 3.9 06/09/2020 0802   AST 41 (H) 06/09/2020 0802   ALT 31 06/09/2020 0802   ALKPHOS 97 06/09/2020 0802   BILITOT 0.2 06/09/2020 0802   GFRNONAA 42 (L) 06/09/2020 0802   GFRAA 48 (L) 06/09/2020 0802    Diabetic Labs (most recent): Lab Results  Component Value Date   HGBA1C 8.1 (A) 10/14/2020   HGBA1C 8.1 (H) 06/09/2020   HGBA1C 8.5 (A) 02/12/2020     Lipid Panel      Component Value Date/Time   CHOL 128 06/09/2020 0802   TRIG 77 06/09/2020 0802   HDL 61 06/09/2020 0802   CHOLHDL 2.1 06/09/2020 0802   CHOLHDL 2.5 05/04/2019 2350   VLDL 20 05/04/2019 2350   LDLCALC 52 06/09/2020 0802    Assessment & Plan:   1) DM type 2 causing vascular disease ([place])  - [name] has currently uncontrolled symptomatic type 2 DM since  76 years of age.  She presents today, accompanied by her daughter, with her meter and logs showing stable glycemic profile overall with some tight fasting glucose numbers.  Her POCT A1c today is 8.1%, unchanged from previous visit.  She does tend to sleep late some days which may be contributing to her low numbers at times.  -her diabetes is complicated by coronary artery disease, carotid  atherosclerosis, renal insufficiency, retinopathy, peripheral neuropathy and ILEEN KAHRE remains at a high risk for more acute and chronic complications which include CAD, CVA, CKD, retinopathy, and neuropathy. These are all discussed in detail with the patient.  - Nutritional counseling repeated at each appointment due to patients tendency to fall back in to old habits.  - The patient admits there is a room for improvement in their diet and drink choices. -  Suggestion is made for the patient to avoid simple carbohydrates from their diet including Cakes, Sweet Desserts / Pastries, Ice Cream, Soda (diet and regular), Sweet Tea, Candies, Chips, Cookies, Sweet Pastries,  Store Bought Juices, Alcohol in Excess of  1-2 drinks a day, Artificial Sweeteners, Coffee Creamer, and "Sugar-free" Products. This will help patient to have stable blood glucose profile and potentially avoid unintended weight gain.   - I encouraged the patient to switch to  unprocessed or minimally processed complex starch and increased protein intake (animal or plant source), fruits, and vegetables.   - Patient is advised to stick to a routine mealtimes to eat 3  meals  a day and avoid unnecessary snacks ( to snack only to correct hypoglycemia).  - she has seen Jearld Fenton, RDN, CDE for individualized diabetes education.  - I have approached her with the following individualized plan to manage diabetes and patient agrees:   -Based on her  glycemic response, she will continue to need intensive treatment with readjusted basal/bolus insulin in order for her to achieve and maintain control of diabetes to target.   -Priority #1 in the care of her diabetes will be to avoid hypoglycemia.  -Given her tight fasting glucose readings, she is advised to lower her dose of Basaglar to 32 units SQ at bedtime, and adjust her Novolog slightly to 6-9 units, if glucose above 90 and eating.  Specific instructions on how to titrate dose based off of blood sugar readings given to patient and daughter in writing.  Advice given to patient and daughter regarding timing of meals to avoid hypoglycemia.  She has done well with reduced dose of Glipizide 2.5 mg XL po daily with breakfast, and is advised to continue.  -She is instructed to continue monitoring blood glucose 4 times a day, before meals and at bedtime and report to the clinic if blood glucose is less than 70 or above 200 for 3 tests in a row.  - Patient is warned not to take insulin without proper monitoring per orders.  -Patient is not a candidate for metformin,SGLT2 inhibitors due to CKD.  - she will be considered for incretin therapy as appropriate next visit. - Patient specific target  A1c;  LDL, HDL, Triglycerides, and  were discussed in detail.  2) BP/HTN: Her blood pressure is controlled to target for her age.  She is advised to continue Coreg 6.25 mg p.o. twice daily, Lasix 40 mg p.o. daily, Hydralazine 25 mg p.o. 3 times daily, and Lisinopril 10 mg p.o. daily.  3) Lipids/HPL:  Her most recent lipid panel from 06/09/20 shows controlled LDL of 52.  She is advised to continue Lipitor 80 mg po daily at  bedtime.  Side effects and precautions discussed with her.  4)  Weight/Diet:  Her Body mass index is 29.5 kg/m.  She is a candidate for modest weight loss.  CDE Consult will be initiated , exercise, and detailed carbohydrates information provided.  5) Chronic Care/Health Maintenance: -she is on ACEI/ARB and Statin medications and  is encouraged to continue to follow up with Ophthalmology, Dentist,  Podiatrist at least yearly or according to recommendations, and advised to  stay away from smoking. I have recommended yearly flu vaccine and pneumonia vaccination at least every 5 years; moderate intensity exercise for up to 150 minutes weekly; and  sleep for at least 7 hours a day.  - I advised patient to maintain close follow up with Redmond School, MD for primary care needs.      I spent 42 minutes in the care of the patient today including review of labs from [place], Lipids, Thyroid Function, Hematology (current and previous including abstractions from other facilities); face-to-face time discussing  her blood glucose readings/logs, discussing hypoglycemia and hyperglycemia episodes and symptoms, medications doses, her options of short and long term treatment based on the latest standards of care / guidelines;  discussion about incorporating lifestyle medicine;  and documenting the encounter.    Please refer to Patient Instructions for Blood Glucose Monitoring and Insulin/Medications Dosing Guide"  in media tab for additional information. Please  also refer to " Patient Self Inventory" in the Media  tab for reviewed elements of pertinent patient history.  [name] participated in the discussions, expressed understanding, and voiced agreement with the above plans.  All questions were answered to her satisfaction. she is encouraged to contact clinic should she have any questions or concerns prior to her return  visit.    Follow up plan: - Return in about 4 months (around 02/13/2021) for  Diabetes follow up with A1c in office, Previsit labs, Bring glucometer and logs.  Rayetta Pigg, [facility] [address] [place], [place] [address] Phone: [phone] Fax: [phone]   10/14/2020, 9:54 AM

## 2020-10-15 DIAGNOSIS — I2583 Coronary atherosclerosis due to lipid rich plaque: Secondary | ICD-10-CM | POA: Diagnosis not present

## 2020-10-15 DIAGNOSIS — Z6829 Body mass index (BMI) 29.0-29.9, adult: Secondary | ICD-10-CM | POA: Diagnosis not present

## 2020-10-15 DIAGNOSIS — E118 Type 2 diabetes mellitus with unspecified complications: Secondary | ICD-10-CM | POA: Diagnosis not present

## 2020-10-15 DIAGNOSIS — Z0001 Encounter for general adult medical examination with abnormal findings: Secondary | ICD-10-CM | POA: Diagnosis not present

## 2020-10-15 DIAGNOSIS — Z1331 Encounter for screening for depression: Secondary | ICD-10-CM | POA: Diagnosis not present

## 2020-10-19 ENCOUNTER — Telehealth: Payer: Self-pay | Admitting: Internal Medicine

## 2020-10-19 NOTE — Telephone Encounter (Signed)
LMOM to call and schedule appt with Dr. Debara Pickett

## 2020-11-05 ENCOUNTER — Encounter (HOSPITAL_COMMUNITY): Payer: Self-pay

## 2020-11-05 ENCOUNTER — Other Ambulatory Visit: Payer: Self-pay

## 2020-11-05 ENCOUNTER — Observation Stay (HOSPITAL_BASED_OUTPATIENT_CLINIC_OR_DEPARTMENT_OTHER)
Admission: EM | Admit: 2020-11-05 | Discharge: 2020-11-06 | Disposition: A | Discharge status: Home / Self Care | Payer: Medicare HMO | Source: Home / Self Care | Attending: Emergency Medicine | Admitting: Emergency Medicine

## 2020-11-05 ENCOUNTER — Emergency Department (HOSPITAL_COMMUNITY): Payer: Medicare HMO

## 2020-11-05 DIAGNOSIS — I7789 Other specified disorders of arteries and arterioles: Secondary | ICD-10-CM | POA: Diagnosis not present

## 2020-11-05 DIAGNOSIS — I4891 Unspecified atrial fibrillation: Secondary | ICD-10-CM | POA: Insufficient documentation

## 2020-11-05 DIAGNOSIS — K219 Gastro-esophageal reflux disease without esophagitis: Secondary | ICD-10-CM | POA: Diagnosis present

## 2020-11-05 DIAGNOSIS — N1831 Chronic kidney disease, stage 3a: Secondary | ICD-10-CM | POA: Insufficient documentation

## 2020-11-05 DIAGNOSIS — Z7982 Long term (current) use of aspirin: Secondary | ICD-10-CM | POA: Insufficient documentation

## 2020-11-05 DIAGNOSIS — Z79899 Other long term (current) drug therapy: Secondary | ICD-10-CM | POA: Insufficient documentation

## 2020-11-05 DIAGNOSIS — Z7984 Long term (current) use of oral hypoglycemic drugs: Secondary | ICD-10-CM | POA: Insufficient documentation

## 2020-11-05 DIAGNOSIS — Z043 Encounter for examination and observation following other accident: Secondary | ICD-10-CM | POA: Diagnosis not present

## 2020-11-05 DIAGNOSIS — Z7901 Long term (current) use of anticoagulants: Secondary | ICD-10-CM | POA: Insufficient documentation

## 2020-11-05 DIAGNOSIS — Z794 Long term (current) use of insulin: Secondary | ICD-10-CM | POA: Insufficient documentation

## 2020-11-05 DIAGNOSIS — E1122 Type 2 diabetes mellitus with diabetic chronic kidney disease: Secondary | ICD-10-CM | POA: Diagnosis present

## 2020-11-05 DIAGNOSIS — Z743 Need for continuous supervision: Secondary | ICD-10-CM | POA: Diagnosis not present

## 2020-11-05 DIAGNOSIS — M50323 Other cervical disc degeneration at C6-C7 level: Secondary | ICD-10-CM | POA: Diagnosis not present

## 2020-11-05 DIAGNOSIS — Z96651 Presence of right artificial knee joint: Secondary | ICD-10-CM | POA: Insufficient documentation

## 2020-11-05 DIAGNOSIS — R55 Syncope and collapse: Secondary | ICD-10-CM | POA: Diagnosis present

## 2020-11-05 DIAGNOSIS — S0121XA Laceration without foreign body of nose, initial encounter: Secondary | ICD-10-CM | POA: Diagnosis not present

## 2020-11-05 DIAGNOSIS — I2510 Atherosclerotic heart disease of native coronary artery without angina pectoris: Secondary | ICD-10-CM | POA: Insufficient documentation

## 2020-11-05 DIAGNOSIS — R040 Epistaxis: Secondary | ICD-10-CM

## 2020-11-05 DIAGNOSIS — R079 Chest pain, unspecified: Secondary | ICD-10-CM | POA: Diagnosis not present

## 2020-11-05 DIAGNOSIS — Z20822 Contact with and (suspected) exposure to covid-19: Secondary | ICD-10-CM | POA: Insufficient documentation

## 2020-11-05 DIAGNOSIS — I5032 Chronic diastolic (congestive) heart failure: Secondary | ICD-10-CM | POA: Insufficient documentation

## 2020-11-05 DIAGNOSIS — E785 Hyperlipidemia, unspecified: Secondary | ICD-10-CM | POA: Diagnosis present

## 2020-11-05 DIAGNOSIS — E1159 Type 2 diabetes mellitus with other circulatory complications: Secondary | ICD-10-CM | POA: Diagnosis present

## 2020-11-05 DIAGNOSIS — R000 Tachycardia, unspecified: Secondary | ICD-10-CM | POA: Diagnosis not present

## 2020-11-05 DIAGNOSIS — I130 Hypertensive heart and chronic kidney disease with heart failure and stage 1 through stage 4 chronic kidney disease, or unspecified chronic kidney disease: Secondary | ICD-10-CM | POA: Insufficient documentation

## 2020-11-05 DIAGNOSIS — R0789 Other chest pain: Secondary | ICD-10-CM | POA: Diagnosis not present

## 2020-11-05 DIAGNOSIS — I6523 Occlusion and stenosis of bilateral carotid arteries: Secondary | ICD-10-CM | POA: Diagnosis not present

## 2020-11-05 DIAGNOSIS — I517 Cardiomegaly: Secondary | ICD-10-CM | POA: Diagnosis not present

## 2020-11-05 DIAGNOSIS — I10 Essential (primary) hypertension: Secondary | ICD-10-CM | POA: Diagnosis present

## 2020-11-05 DIAGNOSIS — E1169 Type 2 diabetes mellitus with other specified complication: Secondary | ICD-10-CM | POA: Diagnosis present

## 2020-11-05 DIAGNOSIS — D62 Acute posthemorrhagic anemia: Secondary | ICD-10-CM | POA: Diagnosis not present

## 2020-11-05 DIAGNOSIS — W19XXXA Unspecified fall, initial encounter: Secondary | ICD-10-CM

## 2020-11-05 DIAGNOSIS — N1832 Chronic kidney disease, stage 3b: Secondary | ICD-10-CM | POA: Diagnosis present

## 2020-11-05 DIAGNOSIS — D6832 Hemorrhagic disorder due to extrinsic circulating anticoagulants: Secondary | ICD-10-CM | POA: Diagnosis not present

## 2020-11-05 DIAGNOSIS — I700 Atherosclerosis of aorta: Secondary | ICD-10-CM | POA: Diagnosis not present

## 2020-11-05 DIAGNOSIS — R58 Hemorrhage, not elsewhere classified: Secondary | ICD-10-CM | POA: Diagnosis not present

## 2020-11-05 LAB — PROTIME-INR
INR: 1.1 (ref 0.8–1.2)
Prothrombin Time: 13.8 seconds (ref 11.4–15.2)

## 2020-11-05 LAB — GLUCOSE, CAPILLARY: Glucose-Capillary: 190 mg/dL — ABNORMAL HIGH (ref 70–99)

## 2020-11-05 LAB — COMPREHENSIVE METABOLIC PANEL
ALT: 23 U/L (ref 0–44)
AST: 31 U/L (ref 15–41)
Albumin: 3.6 g/dL (ref 3.5–5.0)
Alkaline Phosphatase: 83 U/L (ref 38–126)
Anion gap: 7 (ref 5–15)
BUN: 33 mg/dL — ABNORMAL HIGH (ref 8–23)
CO2: 26 mmol/L (ref 22–32)
Calcium: 9.7 mg/dL (ref 8.9–10.3)
Chloride: 106 mmol/L (ref 98–111)
Creatinine, Ser: 1.15 mg/dL — ABNORMAL HIGH (ref 0.44–1.00)
GFR, Estimated: 49 mL/min — ABNORMAL LOW (ref >60)
Glucose, Bld: 111 mg/dL — ABNORMAL HIGH (ref 70–99)
Potassium: 4.2 mmol/L (ref 3.5–5.1)
Sodium: 139 mmol/L (ref 135–145)
Total Bilirubin: 0.6 mg/dL (ref 0.3–1.2)
Total Protein: 7.2 g/dL (ref 6.5–8.1)

## 2020-11-05 LAB — RESP PANEL BY RT-PCR (FLU A&B, COVID) ARPGX2
Influenza A by PCR: NEGATIVE
Influenza B by PCR: NEGATIVE
SARS Coronavirus 2 by RT PCR: NEGATIVE

## 2020-11-05 LAB — CBC WITH DIFFERENTIAL/PLATELET
Abs Immature Granulocytes: 0.02 10*3/uL (ref 0.00–0.07)
Basophils Absolute: 0 10*3/uL (ref 0.0–0.1)
Basophils Relative: 1 %
Eosinophils Absolute: 0.1 10*3/uL (ref 0.0–0.5)
Eosinophils Relative: 1 %
HCT: 40.9 % (ref 36.0–46.0)
Hemoglobin: 13.8 g/dL (ref 12.0–15.0)
Immature Granulocytes: 0 %
Lymphocytes Relative: 14 %
Lymphs Abs: 1 10*3/uL (ref 0.7–4.0)
MCH: 28.5 pg (ref 26.0–34.0)
MCHC: 33.7 g/dL (ref 30.0–36.0)
MCV: 84.3 fL (ref 80.0–100.0)
Monocytes Absolute: 0.3 10*3/uL (ref 0.1–1.0)
Monocytes Relative: 5 %
Neutro Abs: 5.6 10*3/uL (ref 1.7–7.7)
Neutrophils Relative %: 79 %
Platelets: 203 10*3/uL (ref 150–400)
RBC: 4.85 MIL/uL (ref 3.87–5.11)
RDW: 14.7 % (ref 11.5–15.5)
WBC: 7 10*3/uL (ref 4.0–10.5)
nRBC: 0 % (ref 0.0–0.2)

## 2020-11-05 LAB — PHOSPHORUS: Phosphorus: 3.5 mg/dL (ref 2.5–4.6)

## 2020-11-05 LAB — MAGNESIUM: Magnesium: 1.9 mg/dL (ref 1.7–2.4)

## 2020-11-05 LAB — CBG MONITORING, ED: Glucose-Capillary: 161 mg/dL — ABNORMAL HIGH (ref 70–99)

## 2020-11-05 LAB — CK: Total CK: 223 U/L (ref 38–234)

## 2020-11-05 MED ORDER — METOPROLOL TARTRATE 5 MG/5ML IV SOLN
5.0000 mg | Freq: Once | INTRAVENOUS | Status: AC
  Administered 2020-11-05: 5 mg via INTRAVENOUS
  Filled 2020-11-05: qty 5

## 2020-11-05 MED ORDER — CARVEDILOL 3.125 MG PO TABS
3.1250 mg | ORAL_TABLET | Freq: Two times a day (BID) | ORAL | Status: DC
  Administered 2020-11-05 – 2020-11-06 (×3): 3.125 mg via ORAL
  Filled 2020-11-05 (×3): qty 1

## 2020-11-05 MED ORDER — ACETAMINOPHEN 325 MG PO TABS
650.0000 mg | ORAL_TABLET | Freq: Four times a day (QID) | ORAL | Status: DC | PRN
  Administered 2020-11-06: 650 mg via ORAL
  Filled 2020-11-05: qty 2

## 2020-11-05 MED ORDER — GUAIFENESIN-DM 100-10 MG/5ML PO SYRP: 10.0000 mL | ORAL_SOLUTION | ORAL | Status: DC | PRN

## 2020-11-05 MED ORDER — TRANEXAMIC ACID FOR EPISTAXIS
500.0000 mg | Freq: Once | TOPICAL | Status: AC
  Administered 2020-11-05: 500 mg via TOPICAL
  Filled 2020-11-05: qty 10

## 2020-11-05 MED ORDER — INSULIN ASPART 100 UNIT/ML IJ SOLN
0.0000 [IU] | Freq: Three times a day (TID) | INTRAMUSCULAR | Status: DC
  Administered 2020-11-06: 2 [IU] via SUBCUTANEOUS
  Administered 2020-11-06: 5 [IU] via SUBCUTANEOUS
  Administered 2020-11-06: 9 [IU] via SUBCUTANEOUS

## 2020-11-05 MED ORDER — SODIUM CHLORIDE 0.9% FLUSH
3.0000 mL | Freq: Two times a day (BID) | INTRAVENOUS | Status: DC
  Administered 2020-11-05 – 2020-11-06 (×2): 3 mL via INTRAVENOUS

## 2020-11-05 MED ORDER — SODIUM CHLORIDE 0.9 % IV BOLUS
500.0000 mL | Freq: Once | INTRAVENOUS | Status: AC
  Administered 2020-11-05: 500 mL via INTRAVENOUS

## 2020-11-05 MED ORDER — OXYMETAZOLINE HCL 0.05 % NA SOLN
2.0000 | Freq: Once | NASAL | Status: AC
  Administered 2020-11-05: 2 via NASAL
  Filled 2020-11-05: qty 30

## 2020-11-05 MED ORDER — ONDANSETRON HCL 4 MG/2ML IJ SOLN: 4.0000 mg | Freq: Four times a day (QID) | INTRAMUSCULAR | Status: DC | PRN

## 2020-11-05 MED ORDER — ACETAMINOPHEN 650 MG RE SUPP: 650.0000 mg | Freq: Four times a day (QID) | RECTAL | Status: DC | PRN

## 2020-11-05 MED ORDER — ONDANSETRON HCL 4 MG PO TABS: 4.0000 mg | ORAL_TABLET | Freq: Four times a day (QID) | ORAL | Status: DC | PRN

## 2020-11-05 NOTE — ED Notes (Signed)
Pts daughter at bedside  

## 2020-11-05 NOTE — ED Notes (Signed)
Pt unable to sign MSE due to AMS.  

## 2020-11-05 NOTE — ED Notes (Signed)
Patient transported to CT 

## 2020-11-05 NOTE — ED Provider Notes (Signed)
[facility] EMERGENCY DEPARTMENT Provider Note   CSN: [id number] Arrival date & time: 11/05/20  1422     History Chief Complaint  Patient presents with  . Fall    Cassandra Adams is a 75 y.o. female.  HPI 75 year old female presents to the ER with complaints of fall.  Patient cannot exactly tell me what happened, states that she got into her recliner and the next thing she knew she was on the ground.  Presented with active nosebleed.  Denies any syncope, chest pain, shortness of breath.  No pain other than the nosebleed.    Past Medical History:  Diagnosis Date  . Arthritis    "all over"  . CAD (coronary artery disease)    a. NSTEMI 12/2013 - occluded dominant RCA with L-R collaterals, moderate prox segmental LAD disease, for medical therapy initially, EF 60% with subtle inferobasal hypokinesia. Consider PCI for refractory CP.  [name] CKD (chronic kidney disease), stage III ([place])   . Hypertension   . Migraines    "weekly" (01/06/2014)  . Sickle cell trait ([place])   . TIA (transient ischemic attack) 1978  . Type II diabetes mellitus ([place])   . Vertigo     Patient Active Problem List   Diagnosis Date Noted  . Syncope and collapse 11/05/2020  . Benign paroxysmal positional vertigo 05/11/2019  . Chronic headaches 05/11/2019  . Dizziness and giddiness 05/11/2019  . Atrial fibrillation with RVR ([place])   . Right hemiparesis ([place]) 05/05/2019  . Uncontrolled type 2 diabetes mellitus with hyperglycemia ([place]) 05/05/2019  . CKD stage 3 due to type 2 diabetes mellitus ([place]) 05/05/2019  . Left-sided weakness 05/04/2019  . Aphasia 02/26/2019  . DM type 2 causing vascular disease ([place]) 06/11/2017  . Type 2 diabetes mellitus with stage 3a chronic kidney disease, with long-term current use of insulin ([place]) 06/11/2017  . Carotid stenosis 02/05/2017  . TIA (transient ischemic attack) 01/20/2017  . Carotid stenosis, right 01/20/2017  . Diabetic hyperosmolar non-ketotic state ([place])  01/19/2017  . AKI (acute kidney injury) ([place]) 01/19/2017  . Left leg weakness 01/19/2017  . Dyslipidemia 11/24/2016  . Edema 03/30/2016  . Diaphoresis 02/18/2016  . Hypoglycemia 02/18/2016  . Bradycardia 04/22/2015  . Coronary artery disease due to lipid rich plaque 02/26/2014  . PAF (paroxysmal atrial fibrillation) ([place]) 02/26/2014  . NSTEMI (non-ST elevated myocardial infarction) ([place]) 01/03/2014  . Occlusion and stenosis of carotid artery without mention of cerebral infarction 11/21/2013  . Pain in limb-Left neck 11/21/2013  . Carotid stenosis, bilateral 11/17/2011  . HYPERTHYROIDISM, SUBCLINICAL 02/15/2010  . GERD 02/15/2010  . DYSPHAGIA UNSPECIFIED 02/15/2010  . ABDOMINAL PAIN, UNSPECIFIED SITE 02/15/2010  . Controlled insulin-dependent diabetes mellitus with neuropathy 02/11/2010  . Essential hypertension, benign 02/11/2010  . HEADACHE 02/11/2010    Past Surgical History:  Procedure Laterality Date  . APPENDECTOMY  March 2014   had appendix frozen  . CARDIAC CATHETERIZATION  "years ago" & 01/05/2014  . CARDIOVERSION N/A 03/17/2014   Procedure: CARDIOVERSION;  Surgeon: [name], MD;  Location: [facility] ENDOSCOPY;  Service: Cardiovascular;  Laterality: N/A;  . CATARACT EXTRACTION W/ INTRAOCULAR LENS  IMPLANT, BILATERAL Bilateral 04/2013-05/2013  . ENDARTERECTOMY Right 02/05/2017   Procedure: ENDARTERECTOMY CAROTID-RIGHT;  Surgeon: [name], MD;  Location: [facility] OR;  Service: Vascular;  Laterality: Right;  . ESOPHAGOGASTRODUODENOSCOPY  11/08/2007    Normal esophagus without evidence of Barrett, mass, erosion/ Normal stomach, duodenal bulb  . GASTRIC MOTILITY STUDY  11/13/2007   mildly delayed emptying subjectively,  but normal  analysis-77% of tracer emptied at 2 hours  . JOINT REPLACEMENT    . LAPAROSCOPIC APPENDECTOMY N/A 09/15/2012   Procedure: APPENDECTOMY LAPAROSCOPIC;  Surgeon: [name], MD;  Location: [facility] ORS;  Service: General;  Laterality: N/A;  . LEFT  HEART CATHETERIZATION WITH CORONARY ANGIOGRAM N/A 01/05/2014   Procedure: LEFT HEART CATHETERIZATION WITH CORONARY ANGIOGRAM;  Surgeon: [name], MD;  Location: [facility] CATH LAB;  Service: Cardiovascular;  Laterality: N/A;  . REPLACEMENT TOTAL KNEE Right 05-03-06  . TEE WITHOUT CARDIOVERSION N/A 03/17/2014   Procedure: TRANSESOPHAGEAL ECHOCARDIOGRAM (TEE);  Surgeon: [name], MD;  Location: [facility] ENDOSCOPY;  Service: Cardiovascular;  Laterality: N/A;  . TONSILLECTOMY  1972     OB History    Gravida  2   Para  2   Term  2   Preterm      AB      Living  2     SAB      IAB      Ectopic      Multiple      Live Births              Family History  Problem Relation Age of Onset  . Hyperlipidemia Mother   . Hypertension Mother   . Heart attack Mother   . Cancer Father        lung  . Hypertension Sister   . Diabetes Brother   . Heart disease Brother   . Hyperlipidemia Brother   . Hypertension Brother   . Heart attack Brother   . Other Brother        DVT    Social History   Tobacco Use  . Smoking status: Never Smoker  . Smokeless tobacco: Never Used  Vaping Use  . Vaping Use: Never used  Substance Use Topics  . Alcohol use: No  . Drug use: No    Home Medications Prior to Admission medications   Medication Sig Start Date End Date Taking? Authorizing Provider  aspirin EC 81 MG tablet Take 81 mg by mouth daily.    [provider]  atorvastatin (LIPITOR) 80 MG tablet Take 1 tablet (80 mg total) by mouth every evening. 11/26/17   Hilty, Nadean Corwin, MD  B-D ULTRAFINE III SHORT PEN 31G X 8 MM MISC SMARTSIG:Injection Daily 07/28/20   [provider]  BD INSULIN SYRINGE U/F 31G X 5/16" 0.5 ML MISC SMARTSIG:Injection 4 Times Daily 09/23/20   [provider]  carvedilol (COREG) 3.125 MG tablet Take 3.125 mg by mouth 2 (two) times daily with a meal.    [provider]  colesevelam (WELCHOL) 625 MG tablet Take 1,875 mg by mouth 2  (two) times daily with a meal. *May take 6 tablets once a day with meals*    [provider]  ELIQUIS 5 MG TABS tablet TAKE 1 TABLET BY MOUTH TWICE A DAY 08/15/19   Hilty, Nadean Corwin, MD  furosemide (LASIX) 40 MG tablet Take 40 mg by mouth daily.  10/25/17   [provider]  glipiZIDE (GLUCOTROL XL) 2.5 MG 24 hr tablet Take 1 tablet (2.5 mg total) by mouth daily with breakfast. 02/12/20   Brita Romp, NP  hydrALAZINE (APRESOLINE) 25 MG tablet Take 25 mg by mouth 3 (three) times daily. 09/12/20   [provider]  Insulin Glargine (BASAGLAR KWIKPEN) 100 UNIT/ML Inject 32 Units into the skin at bedtime. 10/14/20   Brita Romp, NP  lisinopril (ZESTRIL) 20  MG tablet Take 1 tablet (20 mg total) by mouth daily. 07/08/20   Hilty, Nadean Corwin, MD  nitroGLYCERIN (NITROSTAT) 0.4 MG SL tablet Place 1 tablet (0.4 mg total) under the tongue every 5 (five) minutes as needed for chest pain (up to 3 doses). 11/26/17   Hilty, Nadean Corwin, MD  NOVOLOG 100 UNIT/ML injection Inject 6-9 Units into the skin 3 (three) times daily with meals. 07/28/20   [provider]  [facility] ULTRA test strip  10/08/20   [provider]  Vitamin D, Cholecalciferol, 25 MCG (1000 UT) TABS Take 2,000 Units by mouth daily. Start after you finish 6 weeks of vit D 50,000    [provider]    Allergies    Penicillins  Review of Systems   Review of Systems  Constitutional: Negative for chills and fever.  HENT: Positive for nosebleeds. Negative for ear pain and sore throat.   Eyes: Negative for pain and visual disturbance.  Respiratory: Negative for cough and shortness of breath.   Cardiovascular: Negative for chest pain and palpitations.  Gastrointestinal: Negative for abdominal pain and vomiting.  Genitourinary: Negative for dysuria and hematuria.  Musculoskeletal: Negative for arthralgias and back pain.  Skin: Negative for color change and rash.  Neurological: Negative for seizures  and syncope.  All other systems reviewed and are negative.   Physical Exam Updated Vital Signs BP (!) 165/101   Pulse 64   Resp 19   Ht 5\' 6"  (1.676 m)   Wt 81.6 kg   SpO2 99%   BMI 29.05 kg/m   Physical Exam Vitals and nursing note reviewed.  Constitutional:      General: She is not in acute distress.    Appearance: She is well-developed.  HENT:     Head: Normocephalic and atraumatic.     Comments: Superficial laceration to the nose bridge.  Small bleed out of bilateral nares.  No visible retained blood products.  No visible raccoon eyes, hemotympanum, mastoid tenderness.  No midline tenderness to the C, T, L-spine.  Moving all 4 extremities without difficulty. Eyes:     Conjunctiva/sclera: Conjunctivae normal.  Cardiovascular:     Rate and Rhythm: Normal rate and regular rhythm.     Heart sounds: No murmur heard.   Pulmonary:     Effort: Pulmonary effort is normal. No respiratory distress.     Breath sounds: Normal breath sounds.  Abdominal:     Palpations: Abdomen is soft.     Tenderness: There is no abdominal tenderness.  Musculoskeletal:        General: Normal range of motion.     Cervical back: Neck supple.     Comments: No C, T, L-spine tenderness.  5/5 strength in upper and lower extremities.  No noticeable step-offs, crepitus, fluctuance, erythema.  Sensations intact.  Full range of motion and strength of neck. Moving all 4 extremities without difficulty.    Skin:    General: Skin is warm and dry.  Neurological:     General: No focal deficit present.     Mental Status: She is alert and oriented to person, place, and time.  Psychiatric:        Mood and Affect: Mood normal.        Behavior: Behavior normal.     ED Results / Procedures / Treatments   Labs (all labs ordered are listed, but only abnormal results are displayed) Labs Reviewed  COMPREHENSIVE METABOLIC PANEL - Abnormal; Notable for the following components:  Result Value   Glucose, Bld  111 (*)    BUN 33 (*)    Creatinine, Ser 1.15 (*)    GFR, Estimated 49 (*)    All other components within normal limits  RESP PANEL BY RT-PCR (FLU A&B, COVID) ARPGX2  CBC WITH DIFFERENTIAL/PLATELET  CK  PROTIME-INR  PROTIME-INR  MAGNESIUM  PHOSPHORUS  HEMOGLOBIN A1C    EKG None  Radiology DG Chest 2 View  Result Date: 11/05/2020 CLINICAL DATA:  75 year old who fell earlier today. Initial encounter. Current history of hypertension and diabetes. EXAM: CHEST - 2 VIEW COMPARISON:  04/12/2019 and earlier. FINDINGS: AP ERECT and LATERAL images were obtained. Suboptimal inspiration accounts for crowded bronchovascular markings, especially in the bases, and accentuates the cardiac silhouette. Taking this into account, cardiac silhouette mildly enlarged and stable. Thoracic aorta atherosclerotic, unchanged. Hilar and mediastinal contours otherwise unremarkable. Lungs clear. Bronchovascular markings normal. Pulmonary vascularity normal. No visible pleural effusions. No pneumothorax. Stable chronic elevation of the RIGHT hemidiaphragm. Degenerative changes involving the lower thoracic and upper lumbar spine. IMPRESSION: Suboptimal inspiration. Stable mild cardiomegaly. No acute cardiopulmonary disease. Electronically Signed   By: Evangeline Dakin M.D.   On: 11/05/2020 16:59   CT Head Wo Contrast  Result Date: 11/05/2020 CLINICAL DATA:  Fall. EXAM: CT HEAD WITHOUT CONTRAST CT MAXILLOFACIAL WITHOUT CONTRAST CT CERVICAL SPINE WITHOUT CONTRAST TECHNIQUE: Multidetector CT imaging of the head, cervical spine, and maxillofacial structures were performed using the standard protocol without intravenous contrast. Multiplanar CT image reconstructions of the cervical spine and maxillofacial structures were also generated. COMPARISON:  CT head dated May 11, 2019. MRI head and cervical spine dated May 05, 2019. CT maxillofacial dated April 12, 2019. FINDINGS: CT HEAD FINDINGS Brain: No evidence of acute  infarction, hemorrhage, hydrocephalus, extra-axial collection or mass lesion/mass effect. Stable mild atrophy. Vascular: Atherosclerotic vascular calcification of the carotid siphons. No hyperdense vessel. Skull: Normal. Negative for fracture or focal lesion. Other: None. CT MAXILLOFACIAL FINDINGS Osseous: No fracture or mandibular dislocation. No destructive process. Orbits: Negative. No traumatic or inflammatory finding. Sinuses: Chronic right maxillary sinusitis and mucoperiosteal thickening. New small air-fluid levels in the left maxillary and right sphenoid sinuses. The mastoid air cells are clear. Soft tissues: Small nasal soft tissue laceration. CT CERVICAL SPINE FINDINGS Alignment: No traumatic malalignment. Unchanged focal reversal of the normal cervical lordosis at C5-C6. Skull base and vertebrae: No acute fracture. No primary bone lesion or focal pathologic process. C5-C6 ankylosis. Soft tissues and spinal canal: No prevertebral fluid or swelling. No visible canal hematoma. Disc levels:  Progressive now severe disc height loss at C6-C7. Upper chest: Negative. Other: None. IMPRESSION: CT head: 1. No acute intracranial abnormality. CT maxillofacial: 1. No acute maxillofacial fracture. Small nasal soft tissue laceration. 2. Chronic right maxillary sinusitis. New small air-fluid levels in the left maxillary and right sphenoid sinuses. Correlate for acute sinusitis. CT cervical spine: 1. No acute cervical spine fracture or traumatic listhesis. 2. Progressive now severe degenerative disc disease at C6-C7. Electronically Signed   By: Titus Dubin M.D.   On: 11/05/2020 17:41   CT Cervical Spine Wo Contrast  Result Date: 11/05/2020 CLINICAL DATA:  Fall. EXAM: CT HEAD WITHOUT CONTRAST CT MAXILLOFACIAL WITHOUT CONTRAST CT CERVICAL SPINE WITHOUT CONTRAST TECHNIQUE: Multidetector CT imaging of the head, cervical spine, and maxillofacial structures were performed using the standard protocol without intravenous  contrast. Multiplanar CT image reconstructions of the cervical spine and maxillofacial structures were also generated. COMPARISON:  CT head dated May 11, 2019. MRI  head and cervical spine dated May 05, 2019. CT maxillofacial dated April 12, 2019. FINDINGS: CT HEAD FINDINGS Brain: No evidence of acute infarction, hemorrhage, hydrocephalus, extra-axial collection or mass lesion/mass effect. Stable mild atrophy. Vascular: Atherosclerotic vascular calcification of the carotid siphons. No hyperdense vessel. Skull: Normal. Negative for fracture or focal lesion. Other: None. CT MAXILLOFACIAL FINDINGS Osseous: No fracture or mandibular dislocation. No destructive process. Orbits: Negative. No traumatic or inflammatory finding. Sinuses: Chronic right maxillary sinusitis and mucoperiosteal thickening. New small air-fluid levels in the left maxillary and right sphenoid sinuses. The mastoid air cells are clear. Soft tissues: Small nasal soft tissue laceration. CT CERVICAL SPINE FINDINGS Alignment: No traumatic malalignment. Unchanged focal reversal of the normal cervical lordosis at C5-C6. Skull base and vertebrae: No acute fracture. No primary bone lesion or focal pathologic process. C5-C6 ankylosis. Soft tissues and spinal canal: No prevertebral fluid or swelling. No visible canal hematoma. Disc levels:  Progressive now severe disc height loss at C6-C7. Upper chest: Negative. Other: None. IMPRESSION: CT head: 1. No acute intracranial abnormality. CT maxillofacial: 1. No acute maxillofacial fracture. Small nasal soft tissue laceration. 2. Chronic right maxillary sinusitis. New small air-fluid levels in the left maxillary and right sphenoid sinuses. Correlate for acute sinusitis. CT cervical spine: 1. No acute cervical spine fracture or traumatic listhesis. 2. Progressive now severe degenerative disc disease at C6-C7. Electronically Signed   By: Titus Dubin M.D.   On: 11/05/2020 17:41   CT Maxillofacial Wo  Contrast  Result Date: 11/05/2020 CLINICAL DATA:  Fall. EXAM: CT HEAD WITHOUT CONTRAST CT MAXILLOFACIAL WITHOUT CONTRAST CT CERVICAL SPINE WITHOUT CONTRAST TECHNIQUE: Multidetector CT imaging of the head, cervical spine, and maxillofacial structures were performed using the standard protocol without intravenous contrast. Multiplanar CT image reconstructions of the cervical spine and maxillofacial structures were also generated. COMPARISON:  CT head dated May 11, 2019. MRI head and cervical spine dated May 05, 2019. CT maxillofacial dated April 12, 2019. FINDINGS: CT HEAD FINDINGS Brain: No evidence of acute infarction, hemorrhage, hydrocephalus, extra-axial collection or mass lesion/mass effect. Stable mild atrophy. Vascular: Atherosclerotic vascular calcification of the carotid siphons. No hyperdense vessel. Skull: Normal. Negative for fracture or focal lesion. Other: None. CT MAXILLOFACIAL FINDINGS Osseous: No fracture or mandibular dislocation. No destructive process. Orbits: Negative. No traumatic or inflammatory finding. Sinuses: Chronic right maxillary sinusitis and mucoperiosteal thickening. New small air-fluid levels in the left maxillary and right sphenoid sinuses. The mastoid air cells are clear. Soft tissues: Small nasal soft tissue laceration. CT CERVICAL SPINE FINDINGS Alignment: No traumatic malalignment. Unchanged focal reversal of the normal cervical lordosis at C5-C6. Skull base and vertebrae: No acute fracture. No primary bone lesion or focal pathologic process. C5-C6 ankylosis. Soft tissues and spinal canal: No prevertebral fluid or swelling. No visible canal hematoma. Disc levels:  Progressive now severe disc height loss at C6-C7. Upper chest: Negative. Other: None. IMPRESSION: CT head: 1. No acute intracranial abnormality. CT maxillofacial: 1. No acute maxillofacial fracture. Small nasal soft tissue laceration. 2. Chronic right maxillary sinusitis. New small air-fluid levels in the  left maxillary and right sphenoid sinuses. Correlate for acute sinusitis. CT cervical spine: 1. No acute cervical spine fracture or traumatic listhesis. 2. Progressive now severe degenerative disc disease at C6-C7. Electronically Signed   By: Titus Dubin M.D.   On: 11/05/2020 17:41    Procedures Procedures   Medications Ordered in ED Medications  carvedilol (COREG) tablet 3.125 mg (3.125 mg Oral Given 11/05/20 1915)  sodium chloride flush (  NS) 0.9 % injection 3 mL (has no administration in time range)  acetaminophen (TYLENOL) tablet 650 mg (has no administration in time range)    Or  acetaminophen (TYLENOL) suppository 650 mg (has no administration in time range)  ondansetron (ZOFRAN) tablet 4 mg (has no administration in time range)    Or  ondansetron (ZOFRAN) injection 4 mg (has no administration in time range)  insulin aspart (novoLOG) injection 0-9 Units (has no administration in time range)  oxymetazoline (AFRIN) 0.05 % nasal spray 2 spray (2 sprays Each Nare Given 11/05/20 1610)  tranexamic acid (CYKLOKAPRON) 1000 MG/10ML topical solution 500 mg (500 mg Topical Given 11/05/20 1914)  sodium chloride 0.9 % bolus 500 mL (500 mLs Intravenous New Bag/Given 11/05/20 1922)    ED Course  I have reviewed the triage vital signs and the nursing notes.  Pertinent labs & imaging results that were available during my care of the patient were reviewed by me and considered in my medical decision making (see chart for details).    MDM Rules/Calculators/A&P                          75 year old female who presents to the ER after a fall.  On arrival, she does have an active nosebleed, however no acute distress, no focal neurodeficits, no raccoon eyes, hemotympanum, visible superficial abrasion to the nose bridge.  She has some mild tenderness to the nose bridge.  Vitals on arrival with hypotension with a blood pressure 172/93, pulse of 75, no evidence of hypoxia.  Answering questions appropriately.   No chest wall tenderness, no abdominal tenderness.  Nosebleed improved with Afrin.  CBC unremarkable, CMP with a mildly elevated creatinine appears to be at baseline.  No significant electrode abnormalities.  CT of the head, cervical spine without any significant acute findings.  CT maxillofacial with visible superficial abrasion seen on physical exam.  X-ray without rib fractures or any other acute findings.  After further discussion with the patient, she does state that she does not know if she had syncopized.  She now endorses that she may have.  Again she has no headache, no focal neurodeficits, no chest pain, no dizziness at this time.  Patient then entered A. fib with RVR with a rate between 1 20-1 40.  Patient given home Coreg and fluids.  Unfortunately she also started to have another nosebleed.  And started a gauze with TXA.  Plan for admission for syncope work-up   With Dr. Olevia Bowens with the hospitalist team who will admit the patient for further evaluation and treatment  This was a shared visit with my supervising physician Dr. Kathrynn Humble who independently saw and evaluated the patient & provided guidance in evaluation/management/disposition ,in agreement with care   Final Clinical Impression(s) / ED Diagnoses Final diagnoses:  Fall, initial encounter  Epistaxis  Atrial fibrillation with RVR [facility])    Rx / DC Orders ED Discharge Orders    None       Lyndel Safe 11/05/20 [place], Ankit, MD 11/12/20 1523

## 2020-11-05 NOTE — ED Triage Notes (Signed)
Pt presents to ED following fall this am, unwitnessed. Per EMS, pt fell from her chair, face first into the floor. Nose is bleeding at this time.

## 2020-11-05 NOTE — H&P (Signed)
History and Physical    Cassandra Adams ZOX:[id number] DOB: 1945/08/22 DOA: 11/05/2020  PCP: Redmond School, MD   Patient coming from: Home.  I have personally briefly reviewed patient's old medical records in [place]  Chief Complaint: Fall.  HPI: Cassandra Adams is a 75 y.o. female with medical history significant of osteoarthritis of multiple sites, CAD, history of NSTEMI, chronic diastolic heart failure with grade 2 diastolic dysfunction on 8119 echo, stage IIIa CKD, hypertension, migraine headaches, sickle cell trait, TIA, type II DM, vertigo who is coming to the emergency department after having a fall at home from her recliner hitting her face and sustaining a nasal contusion which has resulted in epistaxis.  She seems to have passed out, but is unable to provide any specific details.  She denies fever, chills, appetite changes, headache, rhinorrhea, sore throat, dyspnea, wheezing or hemoptysis.  No chest pain, palpitations, diaphoresis, PND, orthopnea or recent pitting edema of the lower extremities.  Denies abdominal pain, nausea, vomiting, diarrhea, constipation, melena or hematochezia.  No dysuria, frequency or hematuria.  No polyuria, polydipsia, polyphagia or blurred vision.  ED Course: Initial vital signs were temperature   , pulse 75, respirations 16, BP 172/93 mmHg.  The patient was in atrial fibrillation with RVR treated with carvedilol and a 500 mL NS bolus.  Her heart rate is down to the low 100s now.  She also received oxymetazoline nasal spray and then tranexamic acid epistaxis.  Lab work: Her CBC was normal.  CMP shows a glucose of 111, BUN at 33 and creatinine 1.15 mg/dL with a GFR 49 mL/min.  The rest of the CMP values were normal.  Total CK2 123 units/L.  Imaging: 2 view chest radiograph shows stable mild cardiomegaly without acute cardiopulmonary disease.  Maxillofacial CT did not show any fractures, but show a small nasal soft tissue laceration, new  with small air-fluid levels in the left maxillary and right sphenoid sinuses.  Chronic right maxillary sinusitis.  CT cervical spine show progressive severe DDD at C6-7, but no acute cervical spine fracture or traumatic listhesis.  CT head without contrast no acute intracranial abnormality.  Review of Systems: As per HPI otherwise all other systems reviewed and are negative.  Past Medical History:  Diagnosis Date  . Arthritis    "all over"  . CAD (coronary artery disease)    a. NSTEMI 12/2013 - occluded dominant RCA with L-R collaterals, moderate prox segmental LAD disease, for medical therapy initially, EF 60% with subtle inferobasal hypokinesia. Consider PCI for refractory CP.  [name] CKD (chronic kidney disease), stage III ([place])   . Hypertension   . Migraines    "weekly" (01/06/2014)  . Sickle cell trait ([place])   . TIA (transient ischemic attack) 1978  . Type II diabetes mellitus ([place])   . Vertigo     Past Surgical History:  Procedure Laterality Date  . APPENDECTOMY  March 2014   had appendix frozen  . CARDIAC CATHETERIZATION  "years ago" & 01/05/2014  . CARDIOVERSION N/A 03/17/2014   Procedure: CARDIOVERSION;  Surgeon: [name], MD;  Location: [facility] ENDOSCOPY;  Service: Cardiovascular;  Laterality: N/A;  . CATARACT EXTRACTION W/ INTRAOCULAR LENS  IMPLANT, BILATERAL Bilateral 04/2013-05/2013  . ENDARTERECTOMY Right 02/05/2017   Procedure: ENDARTERECTOMY CAROTID-RIGHT;  Surgeon: [name], MD;  Location: [facility] OR;  Service: Vascular;  Laterality: Right;  . ESOPHAGOGASTRODUODENOSCOPY  11/08/2007    Normal esophagus without evidence of Barrett, mass, erosion/ Normal stomach, duodenal bulb  .  GASTRIC MOTILITY STUDY  11/13/2007   mildly delayed emptying subjectively, but normal  analysis-77% of tracer emptied at 2 hours  . JOINT REPLACEMENT    . LAPAROSCOPIC APPENDECTOMY N/A 09/15/2012   Procedure: APPENDECTOMY LAPAROSCOPIC;  Surgeon: [name], MD;  Location: [facility] ORS;  Service:  General;  Laterality: N/A;  . LEFT HEART CATHETERIZATION WITH CORONARY ANGIOGRAM N/A 01/05/2014   Procedure: LEFT HEART CATHETERIZATION WITH CORONARY ANGIOGRAM;  Surgeon: [name], MD;  Location: [facility] CATH LAB;  Service: Cardiovascular;  Laterality: N/A;  . REPLACEMENT TOTAL KNEE Right 05-03-06  . TEE WITHOUT CARDIOVERSION N/A 03/17/2014   Procedure: TRANSESOPHAGEAL ECHOCARDIOGRAM (TEE);  Surgeon: [name], MD;  Location: [facility] ENDOSCOPY;  Service: Cardiovascular;  Laterality: N/A;  . [place] History  reports that she has never smoked. She has never used smokeless tobacco. She reports that she does not drink alcohol and does not use drugs.  Allergies  Allergen Reactions  . Penicillins Hives    Has patient had a PCN reaction causing immediate rash, facial/tongue/throat swelling, SOB or lightheadedness with hypotension: no Has patient had a PCN reaction causing severe rash involving mucus membranes or skin necrosis: No  Has patient had a PCN reaction that required hospitalization: no Has patient had a PCN reaction occurring within the last 10 years: no If all of the above answers are "NO", then may proceed with Cephalosporin use.     Family History  Problem Relation Age of Onset  . Hyperlipidemia Mother   . Hypertension Mother   . Heart attack Mother   . Cancer Father        lung  . Hypertension Sister   . Diabetes Brother   . Heart disease Brother   . Hyperlipidemia Brother   . Hypertension Brother   . Heart attack Brother   . Other Brother        DVT   Prior to Admission medications   Medication Sig Start Date End Date Taking? Authorizing Provider  aspirin EC 81 MG tablet Take 81 mg by mouth daily.    [provider]  atorvastatin (LIPITOR) 80 MG tablet Take 1 tablet (80 mg total) by mouth every evening. 11/26/17   Hilty, Nadean Corwin, MD  B-D ULTRAFINE III SHORT PEN 31G X 8 MM MISC SMARTSIG:Injection Daily 07/28/20   [provider]   BD INSULIN SYRINGE U/F 31G X 5/16" 0.5 ML MISC SMARTSIG:Injection 4 Times Daily 09/23/20   [provider]  carvedilol (COREG) 3.125 MG tablet Take 3.125 mg by mouth 2 (two) times daily with a meal.    [provider]  colesevelam (WELCHOL) 625 MG tablet Take 1,875 mg by mouth 2 (two) times daily with a meal. *May take 6 tablets once a day with meals*    [provider]  ELIQUIS 5 MG TABS tablet TAKE 1 TABLET BY MOUTH TWICE A DAY 08/15/19   Hilty, Nadean Corwin, MD  furosemide (LASIX) 40 MG tablet Take 40 mg by mouth daily.  10/25/17   [provider]  glipiZIDE (GLUCOTROL XL) 2.5 MG 24 hr tablet Take 1 tablet (2.5 mg total) by mouth daily with breakfast. 02/12/20   Brita Romp, NP  hydrALAZINE (APRESOLINE) 25 MG tablet Take 25 mg by mouth 3 (three) times daily. 09/12/20   [provider]  Insulin Glargine (BASAGLAR KWIKPEN) 100 UNIT/ML Inject 32 Units into the skin at bedtime. 10/14/20   Brita Romp, NP  lisinopril (ZESTRIL) 20  MG tablet Take 1 tablet (20 mg total) by mouth daily. 07/08/20   Hilty, Nadean Corwin, MD  nitroGLYCERIN (NITROSTAT) 0.4 MG SL tablet Place 1 tablet (0.4 mg total) under the tongue every 5 (five) minutes as needed for chest pain (up to 3 doses). 11/26/17   Hilty, Nadean Corwin, MD  NOVOLOG 100 UNIT/ML injection Inject 6-9 Units into the skin 3 (three) times daily with meals. 07/28/20   [provider]  [facility] ULTRA test strip  10/08/20   [provider]  Vitamin D, Cholecalciferol, 25 MCG (1000 UT) TABS Take 2,000 Units by mouth daily. Start after you finish 6 weeks of vit D 50,000    [provider]    Physical Exam: Vitals:   11/05/20 1906 11/05/20 1910 11/05/20 1920 11/05/20 1925  BP:    (!) 165/101  Pulse: 82 79 (!) 104 64  Resp:  20 18 19   SpO2:    99%  Weight:      Height:        Constitutional: NAD, calm, comfortable Eyes: PERRL, lids and conjunctivae normal ENMT: No hemotympanum.  Positive  nasal abrasions with surrounding edema and ecchymosis.  Right nostril is packed.  Mucous membranes are moist. Posterior pharynx clear of any exudate or lesions. Neck: normal, supple, no masses, no thyromegaly Respiratory: clear to auscultation bilaterally, no wheezing, no crackles. Normal respiratory effort. No accessory muscle use.  Cardiovascular: Irregularly irregular with a HR in the 80s, no murmurs / rubs / gallops. No extremity edema. 2+ pedal pulses. No carotid bruits.  Abdomen: Obese, no distention.  Bowel sounds positive.  Soft, no tenderness, no masses palpated. No hepatosplenomegaly. Musculoskeletal: Mild generalized weakness.  No clubbing / cyanosis. Good ROM, no contractures. Normal muscle tone.  Skin: Nasal and frontal area erythema, edema and ecchymosis. Neurologic: CN 2-12 grossly intact. Sensation intact, DTR normal. Strength 5/5 in all 4.  Psychiatric: Normal judgment and insight. Alert and oriented x 3. Normal mood.   Labs on Admission: I have personally reviewed following labs and imaging studies  CBC: Recent Labs  Lab 11/05/20 1550  WBC 7.0  NEUTROABS 5.6  HGB 13.8  HCT 40.9  MCV 84.3  PLT 188    Basic Metabolic Panel: Recent Labs  Lab 11/05/20 1550  NA 139  K 4.2  CL 106  CO2 26  GLUCOSE 111*  BUN 33*  CREATININE 1.15*  CALCIUM 9.7    GFR: Estimated Creatinine Clearance: 44.8 mL/min (A) (by C-G formula based on SCr of 1.15 mg/dL (H)).  Liver Function Tests: Recent Labs  Lab 11/05/20 1550  AST 31  ALT 23  ALKPHOS 83  BILITOT 0.6  PROT 7.2  ALBUMIN 3.6   Radiological Exams on Admission: DG Chest 2 View  Result Date: 11/05/2020 CLINICAL DATA:  75 year old who fell earlier today. Initial encounter. Current history of hypertension and diabetes. EXAM: CHEST - 2 VIEW COMPARISON:  04/12/2019 and earlier. FINDINGS: AP ERECT and LATERAL images were obtained. Suboptimal inspiration accounts for crowded bronchovascular markings, especially in the  bases, and accentuates the cardiac silhouette. Taking this into account, cardiac silhouette mildly enlarged and stable. Thoracic aorta atherosclerotic, unchanged. Hilar and mediastinal contours otherwise unremarkable. Lungs clear. Bronchovascular markings normal. Pulmonary vascularity normal. No visible pleural effusions. No pneumothorax. Stable chronic elevation of the RIGHT hemidiaphragm. Degenerative changes involving the lower thoracic and upper lumbar spine. IMPRESSION: Suboptimal inspiration. Stable mild cardiomegaly. No acute cardiopulmonary disease. Electronically Signed   By: Evangeline Dakin M.D.   On: 11/05/2020  16:59   CT Head Wo Contrast  Result Date: 11/05/2020 CLINICAL DATA:  Fall. EXAM: CT HEAD WITHOUT CONTRAST CT MAXILLOFACIAL WITHOUT CONTRAST CT CERVICAL SPINE WITHOUT CONTRAST TECHNIQUE: Multidetector CT imaging of the head, cervical spine, and maxillofacial structures were performed using the standard protocol without intravenous contrast. Multiplanar CT image reconstructions of the cervical spine and maxillofacial structures were also generated. COMPARISON:  CT head dated May 11, 2019. MRI head and cervical spine dated May 05, 2019. CT maxillofacial dated April 12, 2019. FINDINGS: CT HEAD FINDINGS Brain: No evidence of acute infarction, hemorrhage, hydrocephalus, extra-axial collection or mass lesion/mass effect. Stable mild atrophy. Vascular: Atherosclerotic vascular calcification of the carotid siphons. No hyperdense vessel. Skull: Normal. Negative for fracture or focal lesion. Other: None. CT MAXILLOFACIAL FINDINGS Osseous: No fracture or mandibular dislocation. No destructive process. Orbits: Negative. No traumatic or inflammatory finding. Sinuses: Chronic right maxillary sinusitis and mucoperiosteal thickening. New small air-fluid levels in the left maxillary and right sphenoid sinuses. The mastoid air cells are clear. Soft tissues: Small nasal soft tissue laceration. CT  CERVICAL SPINE FINDINGS Alignment: No traumatic malalignment. Unchanged focal reversal of the normal cervical lordosis at C5-C6. Skull base and vertebrae: No acute fracture. No primary bone lesion or focal pathologic process. C5-C6 ankylosis. Soft tissues and spinal canal: No prevertebral fluid or swelling. No visible canal hematoma. Disc levels:  Progressive now severe disc height loss at C6-C7. Upper chest: Negative. Other: None. IMPRESSION: CT head: 1. No acute intracranial abnormality. CT maxillofacial: 1. No acute maxillofacial fracture. Small nasal soft tissue laceration. 2. Chronic right maxillary sinusitis. New small air-fluid levels in the left maxillary and right sphenoid sinuses. Correlate for acute sinusitis. CT cervical spine: 1. No acute cervical spine fracture or traumatic listhesis. 2. Progressive now severe degenerative disc disease at C6-C7. Electronically Signed   By: Titus Dubin M.D.   On: 11/05/2020 17:41   CT Cervical Spine Wo Contrast  Result Date: 11/05/2020 CLINICAL DATA:  Fall. EXAM: CT HEAD WITHOUT CONTRAST CT MAXILLOFACIAL WITHOUT CONTRAST CT CERVICAL SPINE WITHOUT CONTRAST TECHNIQUE: Multidetector CT imaging of the head, cervical spine, and maxillofacial structures were performed using the standard protocol without intravenous contrast. Multiplanar CT image reconstructions of the cervical spine and maxillofacial structures were also generated. COMPARISON:  CT head dated May 11, 2019. MRI head and cervical spine dated May 05, 2019. CT maxillofacial dated April 12, 2019. FINDINGS: CT HEAD FINDINGS Brain: No evidence of acute infarction, hemorrhage, hydrocephalus, extra-axial collection or mass lesion/mass effect. Stable mild atrophy. Vascular: Atherosclerotic vascular calcification of the carotid siphons. No hyperdense vessel. Skull: Normal. Negative for fracture or focal lesion. Other: None. CT MAXILLOFACIAL FINDINGS Osseous: No fracture or mandibular dislocation. No  destructive process. Orbits: Negative. No traumatic or inflammatory finding. Sinuses: Chronic right maxillary sinusitis and mucoperiosteal thickening. New small air-fluid levels in the left maxillary and right sphenoid sinuses. The mastoid air cells are clear. Soft tissues: Small nasal soft tissue laceration. CT CERVICAL SPINE FINDINGS Alignment: No traumatic malalignment. Unchanged focal reversal of the normal cervical lordosis at C5-C6. Skull base and vertebrae: No acute fracture. No primary bone lesion or focal pathologic process. C5-C6 ankylosis. Soft tissues and spinal canal: No prevertebral fluid or swelling. No visible canal hematoma. Disc levels:  Progressive now severe disc height loss at C6-C7. Upper chest: Negative. Other: None. IMPRESSION: CT head: 1. No acute intracranial abnormality. CT maxillofacial: 1. No acute maxillofacial fracture. Small nasal soft tissue laceration. 2. Chronic right maxillary sinusitis. New small air-fluid levels in  the left maxillary and right sphenoid sinuses. Correlate for acute sinusitis. CT cervical spine: 1. No acute cervical spine fracture or traumatic listhesis. 2. Progressive now severe degenerative disc disease at C6-C7. Electronically Signed   By: Titus Dubin M.D.   On: 11/05/2020 17:41   CT Maxillofacial Wo Contrast  Result Date: 11/05/2020 CLINICAL DATA:  Fall. EXAM: CT HEAD WITHOUT CONTRAST CT MAXILLOFACIAL WITHOUT CONTRAST CT CERVICAL SPINE WITHOUT CONTRAST TECHNIQUE: Multidetector CT imaging of the head, cervical spine, and maxillofacial structures were performed using the standard protocol without intravenous contrast. Multiplanar CT image reconstructions of the cervical spine and maxillofacial structures were also generated. COMPARISON:  CT head dated May 11, 2019. MRI head and cervical spine dated May 05, 2019. CT maxillofacial dated April 12, 2019. FINDINGS: CT HEAD FINDINGS Brain: No evidence of acute infarction, hemorrhage, hydrocephalus,  extra-axial collection or mass lesion/mass effect. Stable mild atrophy. Vascular: Atherosclerotic vascular calcification of the carotid siphons. No hyperdense vessel. Skull: Normal. Negative for fracture or focal lesion. Other: None. CT MAXILLOFACIAL FINDINGS Osseous: No fracture or mandibular dislocation. No destructive process. Orbits: Negative. No traumatic or inflammatory finding. Sinuses: Chronic right maxillary sinusitis and mucoperiosteal thickening. New small air-fluid levels in the left maxillary and right sphenoid sinuses. The mastoid air cells are clear. Soft tissues: Small nasal soft tissue laceration. CT CERVICAL SPINE FINDINGS Alignment: No traumatic malalignment. Unchanged focal reversal of the normal cervical lordosis at C5-C6. Skull base and vertebrae: No acute fracture. No primary bone lesion or focal pathologic process. C5-C6 ankylosis. Soft tissues and spinal canal: No prevertebral fluid or swelling. No visible canal hematoma. Disc levels:  Progressive now severe disc height loss at C6-C7. Upper chest: Negative. Other: None. IMPRESSION: CT head: 1. No acute intracranial abnormality. CT maxillofacial: 1. No acute maxillofacial fracture. Small nasal soft tissue laceration. 2. Chronic right maxillary sinusitis. New small air-fluid levels in the left maxillary and right sphenoid sinuses. Correlate for acute sinusitis. CT cervical spine: 1. No acute cervical spine fracture or traumatic listhesis. 2. Progressive now severe degenerative disc disease at C6-C7. Electronically Signed   By: Titus Dubin M.D.   On: 11/05/2020 17:41    EKG: Independently reviewed.  Vent. rate 118 BPM PR interval * ms QRS duration 102 ms QT/QTcB 300/383 ms P-R-T axes * 26 -54 Atrial fibrillation Ventricular premature complex Probable LVH with secondary repol abnrm Inferior infarct, age indeterminate Lateral leads are also involved Artifact in lead(s) I III aVR aVL aVF  Assessment/Plan Principal Problem:    Syncope and collapse No apparent prodromal symptoms. Place in observation/stepdown. Fall precautions. Carotid US with normal flow in February/2022. Check complete echocardiogram.  Active Problems:   Epistaxis due to trauma Continue tranexamic acid nasal packing. Hold apixaban while actively bleeding.    Atrial fibrillation with RVR (HCC) Continue carvedilol 3.125 mg p.o. twice daily. Holding apixaban due to active epistaxis.    CAD (coronary artery disease) Continue aspirin, carvedilol and WelChol.    Essential hypertension, benign Continue carvedilol 3.125 mg p.o. twice daily. Continue hydralazine 25 mg p.o. 3 times daily. Continue lisinopril 20 mg p.o. daily. Continue furosemide 40 mg p.o. daily.    GERD Pantoprazole 40 mg p.o. daily.    Dyslipidemia Continue WelChol twice daily.    DM type 2 causing vascular disease (HCC) Continue Lantus 32 units SQ at bedtime. CBG monitoring with R ISS.    CKD stage 3a due to type 2 diabetes mellitus (HCC) Creatinine improved from baseline. GFR is stable. Monitor renal function electrolytes.  DVT prophylaxis: On Eliquis. Code Status:   Full code. Family Communication:   Disposition Plan:   Patient is from:  Home.  Anticipated DC to:  Home.  Anticipated DC date:  11/06/2020.  Anticipated DC barriers: Clinical status/pending echo.  Consults called:   Admission status:  Observation/telemetry.   Severity of Illness: High severity due to sustaining facial trauma resulting in epistaxis in the setting of syncope and fall.  The patient will remain in the hospital for A. fib with RVR/epistaxis treatment, monitoring and evaluation with echocardiogram.  Reubin Milan MD [facility]  How to contact the [facility] Attending or Consulting provider [place] or covering provider during after hours [place], for this patient?   1. Check the care team in [facility] and look for a) attending/consulting [facility] provider listed and b) the [facility] team  listed 2. Log into [email or web address] and use [place]'s universal password to access. If you do not have the password, please contact the hospital operator. 3. Locate the [facility] provider you are looking for under [facility] and page to a number that you can be directly reached. 4. If you still have difficulty reaching the provider, please page the [facility] (Director on Call) for the Hospitalists listed on amion for assistance.  11/05/2020, 7:49 PM   This document was prepared using [employer] and may contain some unintended transcription errors

## 2020-11-06 ENCOUNTER — Observation Stay (HOSPITAL_BASED_OUTPATIENT_CLINIC_OR_DEPARTMENT_OTHER): Payer: Medicare HMO

## 2020-11-06 DIAGNOSIS — I4891 Unspecified atrial fibrillation: Secondary | ICD-10-CM

## 2020-11-06 DIAGNOSIS — N183 Chronic kidney disease, stage 3 unspecified: Secondary | ICD-10-CM | POA: Diagnosis not present

## 2020-11-06 DIAGNOSIS — E1140 Type 2 diabetes mellitus with diabetic neuropathy, unspecified: Secondary | ICD-10-CM | POA: Diagnosis not present

## 2020-11-06 DIAGNOSIS — R55 Syncope and collapse: Secondary | ICD-10-CM | POA: Diagnosis not present

## 2020-11-06 DIAGNOSIS — E1159 Type 2 diabetes mellitus with other circulatory complications: Secondary | ICD-10-CM

## 2020-11-06 DIAGNOSIS — E1122 Type 2 diabetes mellitus with diabetic chronic kidney disease: Secondary | ICD-10-CM

## 2020-11-06 DIAGNOSIS — I361 Nonrheumatic tricuspid (valve) insufficiency: Secondary | ICD-10-CM | POA: Diagnosis not present

## 2020-11-06 DIAGNOSIS — E785 Hyperlipidemia, unspecified: Secondary | ICD-10-CM

## 2020-11-06 DIAGNOSIS — I340 Nonrheumatic mitral (valve) insufficiency: Secondary | ICD-10-CM

## 2020-11-06 DIAGNOSIS — R040 Epistaxis: Secondary | ICD-10-CM | POA: Diagnosis not present

## 2020-11-06 LAB — HEMOGLOBIN A1C
Hgb A1c MFr Bld: 8.2 % — ABNORMAL HIGH (ref 4.8–5.6)
Mean Plasma Glucose: 188.64 mg/dL

## 2020-11-06 LAB — ECHOCARDIOGRAM COMPLETE
Area-P 1/2: 3.6 cm2
Height: 66 in
S' Lateral: 3.4 cm
Weight: 2768.98 oz

## 2020-11-06 LAB — MRSA PCR SCREENING: MRSA by PCR: NEGATIVE

## 2020-11-06 LAB — GLUCOSE, CAPILLARY
Glucose-Capillary: 269 mg/dL — ABNORMAL HIGH (ref 70–99)
Glucose-Capillary: 374 mg/dL — ABNORMAL HIGH (ref 70–99)
Glucose-Capillary: 181 mg/dL — ABNORMAL HIGH (ref 70–99)

## 2020-11-06 LAB — PROTIME-INR
INR: 1.4 — ABNORMAL HIGH (ref 0.8–1.2)
Prothrombin Time: 17 seconds — ABNORMAL HIGH (ref 11.4–15.2)

## 2020-11-06 MED ORDER — GLIPIZIDE ER 2.5 MG PO TB24
2.5000 mg | ORAL_TABLET | Freq: Every day | ORAL | Status: DC
  Filled 2020-11-06 (×3): qty 1

## 2020-11-06 MED ORDER — ASPIRIN EC 81 MG PO TBEC
81.0000 mg | DELAYED_RELEASE_TABLET | Freq: Every day | ORAL | Status: DC
  Administered 2020-11-06: 81 mg via ORAL
  Filled 2020-11-06: qty 1

## 2020-11-06 MED ORDER — HYDRALAZINE HCL 25 MG PO TABS
25.0000 mg | ORAL_TABLET | Freq: Three times a day (TID) | ORAL | Status: DC
  Administered 2020-11-06 (×2): 25 mg via ORAL
  Filled 2020-11-06 (×2): qty 1

## 2020-11-06 MED ORDER — INSULIN GLARGINE 100 UNIT/ML ~~LOC~~ SOLN
32.0000 [IU] | Freq: Every day | SUBCUTANEOUS | Status: DC
  Filled 2020-11-06 (×2): qty 0.32

## 2020-11-06 MED ORDER — CARVEDILOL 3.125 MG PO TABS: 3.1250 mg | ORAL_TABLET | Freq: Two times a day (BID) | ORAL | Status: DC

## 2020-11-06 MED ORDER — ELIQUIS 5 MG PO TABS: 1.0000 | ORAL_TABLET | Freq: Two times a day (BID) | ORAL | 1 refills | Status: DC

## 2020-11-06 MED ORDER — ATORVASTATIN CALCIUM 40 MG PO TABS: 80.0000 mg | ORAL_TABLET | Freq: Every evening | ORAL | Status: DC

## 2020-11-06 MED ORDER — FUROSEMIDE 40 MG PO TABS
40.0000 mg | ORAL_TABLET | Freq: Every day | ORAL | Status: DC
  Administered 2020-11-06: 40 mg via ORAL
  Filled 2020-11-06: qty 1

## 2020-11-06 MED ORDER — CHLORHEXIDINE GLUCONATE CLOTH 2 % EX PADS: 6.0000 | MEDICATED_PAD | Freq: Every day | CUTANEOUS | Status: DC

## 2020-11-06 MED ORDER — PANTOPRAZOLE SODIUM 40 MG PO TBEC
40.0000 mg | DELAYED_RELEASE_TABLET | Freq: Every day | ORAL | Status: DC
  Administered 2020-11-06: 40 mg via ORAL
  Filled 2020-11-06: qty 1

## 2020-11-06 MED ORDER — COLESEVELAM HCL 625 MG PO TABS
1875.0000 mg | ORAL_TABLET | Freq: Two times a day (BID) | ORAL | Status: DC
  Administered 2020-11-06 (×2): 1875 mg via ORAL
  Filled 2020-11-06 (×5): qty 3

## 2020-11-06 MED ORDER — INSULIN ASPART 100 UNIT/ML IJ SOLN
12.0000 [IU] | Freq: Three times a day (TID) | INTRAMUSCULAR | Status: DC
  Administered 2020-11-06: 12 [IU] via SUBCUTANEOUS

## 2020-11-06 MED ORDER — NITROGLYCERIN 0.4 MG SL SUBL: 0.4000 mg | SUBLINGUAL_TABLET | SUBLINGUAL | Status: DC | PRN

## 2020-11-06 MED ORDER — LISINOPRIL 10 MG PO TABS
20.0000 mg | ORAL_TABLET | Freq: Every day | ORAL | Status: DC
  Administered 2020-11-06: 20 mg via ORAL
  Filled 2020-11-06: qty 2

## 2020-11-06 MED ORDER — INSULIN ASPART 100 UNIT/ML IJ SOLN
8.0000 [IU] | Freq: Three times a day (TID) | INTRAMUSCULAR | Status: DC
  Administered 2020-11-06: 8 [IU] via SUBCUTANEOUS

## 2020-11-06 MED ORDER — ASPIRIN EC 81 MG PO TBEC: 81.0000 mg | DELAYED_RELEASE_TABLET | Freq: Every day | ORAL | 11 refills | Status: DC

## 2020-11-06 NOTE — Progress Notes (Addendum)
Packing removed and no new bleeding at this time.  Dr. Wynetta Emery was in the room at time of removal and gave order to remove

## 2020-11-06 NOTE — Discharge Instructions (Signed)
Syncope Syncope is when you pass out (faint) for a short time. It is caused by a sudden decrease in blood flow to the brain. Signs that you may be about to pass out include:  Feeling dizzy or light-headed.  Feeling sick to your stomach (nauseous).  Seeing all white or all black.  Having cold, clammy skin. If you pass out, get help right away. Call your local emergency services (911 in the [place] Do not drive yourself to the hospital. Follow these instructions at home: Watch for any changes in your symptoms. Take these actions to stay safe and help with your symptoms: Lifestyle  Do not drive, use machinery, or play sports until your doctor says it is okay.  Do not drink alcohol.  Do not use any products that contain nicotine or tobacco, such as cigarettes and e-cigarettes. If you need help quitting, ask your doctor.  Drink enough fluid to keep your pee (urine) pale yellow. General instructions  Take over-the-counter and prescription medicines only as told by your doctor.  If you are taking blood pressure or heart medicine, sit up and stand up slowly. Spend a few minutes getting ready to sit and then stand. This can help you feel less dizzy.  Have someone stay with you until you feel stable.  If you start to feel like you might pass out, lie down right away and raise (elevate) your feet above the level of your heart. Breathe deeply and steadily. Wait until all of the symptoms are gone.  Keep all follow-up visits as told by your doctor. This is important. Get help right away if:  You have a very bad headache.  You pass out once or more than once.  You have pain in your chest, belly, or back.  You have a very fast or uneven heartbeat (palpitations).  It hurts to breathe.  You are bleeding from your mouth or your bottom (rectum).  You have black or tarry poop (stool).  You have jerky movements that you cannot control (seizure).  You are confused.  You have trouble  walking.  You are very weak.  You have vision problems. These symptoms may be an emergency. Do not wait to see if the symptoms will go away. Get medical help right away. Call your local emergency services (911 in the [place] Do not drive yourself to the hospital. Summary  Syncope is when you pass out (faint) for a short time. It is caused by a sudden decrease in blood flow to the brain.  Signs that you may be about to faint include feeling dizzy, light-headed, or sick to your stomach, seeing all white or all black, or having cold, clammy skin.  If you start to feel like you might pass out, lie down right away and raise (elevate) your feet above the level of your heart. Breathe deeply and steadily. Wait until all of the symptoms are gone. This information is not intended to replace advice given to you by your health care provider. Make sure you discuss any questions you have with your health care provider. Document Revised: 07/23/2019 Document Reviewed: 07/25/2017 Elsevier Patient Education  2021 [place].    IMPORTANT INFORMATION: PAY CLOSE ATTENTION   PHYSICIAN DISCHARGE INSTRUCTIONS  Follow with Primary care provider  Redmond School, MD  and other consultants as instructed by your Hospitalist Physician  [place] IF SYMPTOMS COME BACK, WORSEN OR NEW PROBLEM DEVELOPS   Please note: You were cared for by  a hospitalist during your hospital stay. Every effort will be made to forward records to your primary care provider.  You can request that your primary care provider send for your hospital records if they have not received them.  Once you are discharged, your primary care physician will handle any further medical issues. Please note that NO REFILLS for any discharge medications will be authorized once you are discharged, as it is imperative that you return to your primary care physician (or establish a relationship with a primary care physician if  you do not have one) for your post hospital discharge needs so that they can reassess your need for medications and monitor your lab values.  Please get a complete blood count and chemistry panel checked by your Primary MD at your next visit, and again as instructed by your Primary MD.  Get Medicines reviewed and adjusted: Please take all your medications with you for your next visit with your Primary MD  Laboratory/radiological data: Please request your Primary MD to go over all hospital tests and procedure/radiological results at the follow up, please ask your primary care provider to get all Hospital records sent to his/her office.  In some cases, they will be blood work, cultures and biopsy results pending at the time of your discharge. Please request that your primary care provider follow up on these results.  If you are diabetic, please bring your blood sugar readings with you to your follow up appointment with primary care.    Please call and make your follow up appointments as soon as possible.    Also Note the following: If you experience worsening of your admission symptoms, develop shortness of breath, life threatening emergency, suicidal or homicidal thoughts you must seek medical attention immediately by calling 911 or calling your MD immediately  if symptoms less severe.  You must read complete instructions/literature along with all the possible adverse reactions/side effects for all the Medicines you take and that have been prescribed to you. Take any new Medicines after you have completely understood and accpet all the possible adverse reactions/side effects.   Do not drive when taking Pain medications or sleeping medications (Benzodiazepines)  Do not take more than prescribed Pain, Sleep and Anxiety Medications. It is not advisable to combine anxiety,sleep and pain medications without talking with your primary care practitioner  Special Instructions: If you have smoked or  chewed Tobacco  in the last 2 yrs please stop smoking, stop any regular Alcohol  and or any Recreational drug use.  Wear Seat belts while driving.  Do not drive if taking any narcotic, mind altering or controlled substances or recreational drugs or alcohol.

## 2020-11-06 NOTE — Discharge Summary (Signed)
Physician Discharge Summary  TAHLIYAH ANAGNOS JGG:[id number] DOB: August 05, 1945 DOA: 11/05/2020  PCP: Redmond School, MD  Admit date: 11/05/2020 Discharge date: 11/06/2020  Admitted From:  Home   Disposition: Home   Recommendations for Outpatient Follow-up:  1. Follow up with PCP in 1 weeks 2. Follow up with cardiologist in 2 weeks 3. Please obtain CBC in one week to monitor Hg.   Discharge Condition: STABLE   CODE STATUS: FULL DIET: Heart Healthy  Carb Modified avoid concentrated sweets and fruit juices except to treat a low BS   Brief Hospitalization Summary: Please see all hospital notes, images, labs for full details of the hospitalization. ADMISSION HPI:  75 y.o. female with medical history significant of osteoarthritis of multiple sites, CAD, history of NSTEMI, chronic diastolic heart failure with grade 2 diastolic dysfunction on 5465 echo, stage IIIa CKD, hypertension, migraine headaches, sickle cell trait, TIA, type II DM, vertigo who is coming to the emergency department after having a fall at home from her recliner hitting her face and sustaining a nasal contusion which has resulted in epistaxis.  She seems to have passed out, but is unable to provide any specific details.  She denies fever, chills, appetite changes, headache, rhinorrhea, sore throat, dyspnea, wheezing or hemoptysis.  No chest pain, palpitations, diaphoresis, PND, orthopnea or recent pitting edema of the lower extremities.  Denies abdominal pain, nausea, vomiting, diarrhea, constipation, melena or hematochezia.  No dysuria, frequency or hematuria.  No polyuria, polydipsia, polyphagia or blurred vision.  ED Course: Initial vital signs were temperature   , pulse 75, respirations 16, BP 172/93 mmHg.  The patient was in atrial fibrillation with RVR treated with carvedilol and a 500 mL NS bolus.  Her heart rate is down to the low 100s now.  She also received oxymetazoline nasal spray and then tranexamic acid  epistaxis.  Lab work: Her CBC was normal.  CMP shows a glucose of 111, BUN at 33 and creatinine 1.15 mg/dL with a GFR 49 mL/min.  The rest of the CMP values were normal.  Total CK2 123 units/L.  Imaging: 2 view chest radiograph shows stable mild cardiomegaly without acute cardiopulmonary disease.  Maxillofacial CT did not show any fractures, but show a small nasal soft tissue laceration, new with small air-fluid levels in the left maxillary and right sphenoid sinuses.  Chronic right maxillary sinusitis.  CT cervical spine show progressive severe DDD at C6-7, but no acute cervical spine fracture or traumatic listhesis.  CT head without contrast no acute intracranial abnormality.  Hospital Course  Pt was admitted for observation for fall at home where she hit head and nose and had epistaxis which was succesfully treated in the ED by ED provider.  Nasal packing was removed 5/14 and no further bleeding.  Apixaban was temporarily on hold but can be resumed tomorrow.  Aspirin resume on 5/16.  Pt seen by PT but no PT follow up was recommended.  Pt had a 2D echocardiogram done with report: 1. Left ventricular ejection fraction, by estimation, is 55 to 60%. The left ventricle has normal function. The left ventricle demonstrates regional wall motion abnormalities (see scoring diagram/findings for  description). There is moderate asymmetric left ventricular hypertrophy of the basal and septal segments. Left ventricular diastolic parameters are indeterminate.  2. Right ventricular systolic function is normal. The right ventricular  size is normal. There is normal pulmonary artery systolic pressure. The estimated right ventricular systolic pressure is 03.5 mmHg.  3. Left atrial size was mild  to moderately dilated.  4. Possible small to moderate pericardial fluid collection at posterior base of LV. No obvious hemodynamic significance.  5. The mitral valve is abnormal, mildly calcified with restricted posterior  leaflet motion. Mild mitral valve regurgitation.  6. The aortic valve is tricuspid. Aortic valve regurgitation is not visualized. Mild to moderate aortic valve sclerosis/calcification is present, without any evidence of aortic stenosis.  7. The inferior vena cava is normal in size with greater than 50% respiratory variability, suggesting right atrial pressure of 3 mmHg.   Pt was felt to have an episode of vasovagal syncope.  Pt had a recent carotid doppler study done with her cardiologist but no significant abnormalities found.  Pt stable to discharge home to follow up outpatient with PCP and cardiologist.  Fall precautions and bleeding precautions reviewed with patient.  Please recheck CBC in 1 - 2 weeks to follow up Hg.      Discharge Diagnoses:  Principal Problem:   Syncope and collapse Active Problems:   Essential hypertension, benign   GERD   Dyslipidemia   DM type 2 causing vascular disease ([place])   CKD stage 3 due to type 2 diabetes mellitus (HCC)   Atrial fibrillation with RVR (HCC)   CAD (coronary artery disease)   Epistaxis due to trauma   Discharge Instructions:  Allergies as of 11/06/2020      Reactions   Penicillins Hives   Has patient had a PCN reaction causing immediate rash, facial/tongue/throat swelling, SOB or lightheadedness with hypotension: no Has patient had a PCN reaction causing severe rash involving mucus membranes or skin necrosis: No  Has patient had a PCN reaction that required hospitalization: no Has patient had a PCN reaction occurring within the last 10 years: no If all of the above answers are "NO", then may proceed with Cephalosporin use.      Medication List    TAKE these medications   aspirin EC 81 MG tablet Take 1 tablet (81 mg total) by mouth daily. Start taking on: Nov 08, 2020 What changed: These instructions start on Nov 08, 2020. If you are unsure what to do until then, ask your doctor or other care provider.   atorvastatin 80 MG  tablet Commonly known as: LIPITOR Take 1 tablet (80 mg total) by mouth every evening.   B-D ULTRAFINE III SHORT PEN 31G X 8 MM Misc Generic drug: Insulin Pen Needle SMARTSIG:Injection Daily   Basaglar KwikPen 100 UNIT/ML Inject 32 Units into the skin at bedtime.   BD Insulin Syringe U/F 31G X 5/16" 0.5 ML Misc Generic drug: Insulin Syringe-Needle U-100 SMARTSIG:Injection 4 Times Daily   carvedilol 3.125 MG tablet Commonly known as: COREG Take 3.125 mg by mouth 2 (two) times daily with a meal.   colesevelam 625 MG tablet Commonly known as: WELCHOL Take 1,875 mg by mouth 2 (two) times daily with a meal. *May take 6 tablets once a day with meals*   Eliquis 5 MG Tabs tablet Generic drug: apixaban Take 1 tablet (5 mg total) by mouth 2 (two) times daily. Start taking on: Nov 07, 2020 What changed: how much to take   furosemide 40 MG tablet Commonly known as: LASIX Take 40 mg by mouth daily.   glipiZIDE 2.5 MG 24 hr tablet Commonly known as: GLUCOTROL XL Take 1 tablet (2.5 mg total) by mouth daily with breakfast.   hydrALAZINE 25 MG tablet Commonly known as: APRESOLINE Take 25 mg by mouth 3 (three) times daily.   lisinopril 20  MG tablet Commonly known as: ZESTRIL Take 1 tablet (20 mg total) by mouth daily.   nitroGLYCERIN 0.4 MG SL tablet Commonly known as: NITROSTAT Place 1 tablet (0.4 mg total) under the tongue every 5 (five) minutes as needed for chest pain (up to 3 doses).   NovoLOG 100 UNIT/ML injection Generic drug: insulin aspart Inject 6-9 Units into the skin 3 (three) times daily with meals.   OneTouch Ultra test strip Generic drug: glucose blood   Vitamin D (Cholecalciferol) 25 MCG (1000 UT) Tabs Take 2,000 Units by mouth daily. Start after you finish 6 weeks of vit D 50,000       Follow-up Information    Redmond School, MD. Schedule an appointment as soon as possible for a visit in 1 week(s).   Specialty: Internal Medicine Why: Hospital Follow Up   Contact information: [address] [place] [address] [phone]        [name], MD. Schedule an appointment as soon as possible for a visit in 2 week(s).   Specialty: Cardiology Why: Hospital Follow Up  Contact information: [address] [place] [address] [phone]              Allergies  Allergen Reactions  . Penicillins Hives    Has patient had a PCN reaction causing immediate rash, facial/tongue/throat swelling, SOB or lightheadedness with hypotension: no Has patient had a PCN reaction causing severe rash involving mucus membranes or skin necrosis: No  Has patient had a PCN reaction that required hospitalization: no Has patient had a PCN reaction occurring within the last 10 years: no If all of the above answers are "NO", then may proceed with Cephalosporin use.    Allergies as of 11/06/2020      Reactions   Penicillins Hives   Has patient had a PCN reaction causing immediate rash, facial/tongue/throat swelling, SOB or lightheadedness with hypotension: no Has patient had a PCN reaction causing severe rash involving mucus membranes or skin necrosis: No  Has patient had a PCN reaction that required hospitalization: no Has patient had a PCN reaction occurring within the last 10 years: no If all of the above answers are "NO", then may proceed with Cephalosporin use.      Medication List    TAKE these medications   aspirin EC 81 MG tablet Take 1 tablet (81 mg total) by mouth daily. Start taking on: Nov 08, 2020 What changed: These instructions start on Nov 08, 2020. If you are unsure what to do until then, ask your doctor or other care provider.   atorvastatin 80 MG tablet Commonly known as: LIPITOR Take 1 tablet (80 mg total) by mouth every evening.   B-D ULTRAFINE III SHORT PEN 31G X 8 MM Misc Generic drug: Insulin Pen Needle SMARTSIG:Injection Daily   Basaglar KwikPen 100 UNIT/ML Inject 32 Units into the skin at  bedtime.   BD Insulin Syringe U/F 31G X 5/16" 0.5 ML Misc Generic drug: Insulin Syringe-Needle U-100 SMARTSIG:Injection 4 Times Daily   carvedilol 3.125 MG tablet Commonly known as: COREG Take 3.125 mg by mouth 2 (two) times daily with a meal.   colesevelam 625 MG tablet Commonly known as: WELCHOL Take 1,875 mg by mouth 2 (two) times daily with a meal. *May take 6 tablets once a day with meals*   Eliquis 5 MG Tabs tablet Generic drug: apixaban Take 1 tablet (5 mg total) by mouth 2 (two) times daily. Start taking on: Nov 07, 2020 What changed:  how much to take   furosemide 40 MG tablet Commonly known as: LASIX Take 40 mg by mouth daily.   glipiZIDE 2.5 MG 24 hr tablet Commonly known as: GLUCOTROL XL Take 1 tablet (2.5 mg total) by mouth daily with breakfast.   hydrALAZINE 25 MG tablet Commonly known as: APRESOLINE Take 25 mg by mouth 3 (three) times daily.   lisinopril 20 MG tablet Commonly known as: ZESTRIL Take 1 tablet (20 mg total) by mouth daily.   nitroGLYCERIN 0.4 MG SL tablet Commonly known as: NITROSTAT Place 1 tablet (0.4 mg total) under the tongue every 5 (five) minutes as needed for chest pain (up to 3 doses).   NovoLOG 100 UNIT/ML injection Generic drug: insulin aspart Inject 6-9 Units into the skin 3 (three) times daily with meals.   OneTouch Ultra test strip Generic drug: glucose blood   Vitamin D (Cholecalciferol) 25 MCG (1000 UT) Tabs Take 2,000 Units by mouth daily. Start after you finish 6 weeks of vit D 50,000       Procedures/Studies: DG Chest 2 View  Result Date: 11/05/2020 CLINICAL DATA:  75 year old who fell earlier today. Initial encounter. Current history of hypertension and diabetes. EXAM: CHEST - 2 VIEW COMPARISON:  04/12/2019 and earlier. FINDINGS: AP ERECT and LATERAL images were obtained. Suboptimal inspiration accounts for crowded bronchovascular markings, especially in the bases, and accentuates the cardiac silhouette. Taking  this into account, cardiac silhouette mildly enlarged and stable. Thoracic aorta atherosclerotic, unchanged. Hilar and mediastinal contours otherwise unremarkable. Lungs clear. Bronchovascular markings normal. Pulmonary vascularity normal. No visible pleural effusions. No pneumothorax. Stable chronic elevation of the RIGHT hemidiaphragm. Degenerative changes involving the lower thoracic and upper lumbar spine. IMPRESSION: Suboptimal inspiration. Stable mild cardiomegaly. No acute cardiopulmonary disease. Electronically Signed   By: Evangeline Dakin M.D.   On: 11/05/2020 16:59   CT Head Wo Contrast  Result Date: 11/05/2020 CLINICAL DATA:  Fall. EXAM: CT HEAD WITHOUT CONTRAST CT MAXILLOFACIAL WITHOUT CONTRAST CT CERVICAL SPINE WITHOUT CONTRAST TECHNIQUE: Multidetector CT imaging of the head, cervical spine, and maxillofacial structures were performed using the standard protocol without intravenous contrast. Multiplanar CT image reconstructions of the cervical spine and maxillofacial structures were also generated. COMPARISON:  CT head dated May 11, 2019. MRI head and cervical spine dated May 05, 2019. CT maxillofacial dated April 12, 2019. FINDINGS: CT HEAD FINDINGS Brain: No evidence of acute infarction, hemorrhage, hydrocephalus, extra-axial collection or mass lesion/mass effect. Stable mild atrophy. Vascular: Atherosclerotic vascular calcification of the carotid siphons. No hyperdense vessel. Skull: Normal. Negative for fracture or focal lesion. Other: None. CT MAXILLOFACIAL FINDINGS Osseous: No fracture or mandibular dislocation. No destructive process. Orbits: Negative. No traumatic or inflammatory finding. Sinuses: Chronic right maxillary sinusitis and mucoperiosteal thickening. New small air-fluid levels in the left maxillary and right sphenoid sinuses. The mastoid air cells are clear. Soft tissues: Small nasal soft tissue laceration. CT CERVICAL SPINE FINDINGS Alignment: No traumatic  malalignment. Unchanged focal reversal of the normal cervical lordosis at C5-C6. Skull base and vertebrae: No acute fracture. No primary bone lesion or focal pathologic process. C5-C6 ankylosis. Soft tissues and spinal canal: No prevertebral fluid or swelling. No visible canal hematoma. Disc levels:  Progressive now severe disc height loss at C6-C7. Upper chest: Negative. Other: None. IMPRESSION: CT head: 1. No acute intracranial abnormality. CT maxillofacial: 1. No acute maxillofacial fracture. Small nasal soft tissue laceration. 2. Chronic right maxillary sinusitis. New small air-fluid levels in the left maxillary and right sphenoid sinuses. Correlate for acute  sinusitis. CT cervical spine: 1. No acute cervical spine fracture or traumatic listhesis. 2. Progressive now severe degenerative disc disease at C6-C7. Electronically Signed   By: Titus Dubin M.D.   On: 11/05/2020 17:41   CT Cervical Spine Wo Contrast  Result Date: 11/05/2020 CLINICAL DATA:  Fall. EXAM: CT HEAD WITHOUT CONTRAST CT MAXILLOFACIAL WITHOUT CONTRAST CT CERVICAL SPINE WITHOUT CONTRAST TECHNIQUE: Multidetector CT imaging of the head, cervical spine, and maxillofacial structures were performed using the standard protocol without intravenous contrast. Multiplanar CT image reconstructions of the cervical spine and maxillofacial structures were also generated. COMPARISON:  CT head dated May 11, 2019. MRI head and cervical spine dated May 05, 2019. CT maxillofacial dated April 12, 2019. FINDINGS: CT HEAD FINDINGS Brain: No evidence of acute infarction, hemorrhage, hydrocephalus, extra-axial collection or mass lesion/mass effect. Stable mild atrophy. Vascular: Atherosclerotic vascular calcification of the carotid siphons. No hyperdense vessel. Skull: Normal. Negative for fracture or focal lesion. Other: None. CT MAXILLOFACIAL FINDINGS Osseous: No fracture or mandibular dislocation. No destructive process. Orbits: Negative. No  traumatic or inflammatory finding. Sinuses: Chronic right maxillary sinusitis and mucoperiosteal thickening. New small air-fluid levels in the left maxillary and right sphenoid sinuses. The mastoid air cells are clear. Soft tissues: Small nasal soft tissue laceration. CT CERVICAL SPINE FINDINGS Alignment: No traumatic malalignment. Unchanged focal reversal of the normal cervical lordosis at C5-C6. Skull base and vertebrae: No acute fracture. No primary bone lesion or focal pathologic process. C5-C6 ankylosis. Soft tissues and spinal canal: No prevertebral fluid or swelling. No visible canal hematoma. Disc levels:  Progressive now severe disc height loss at C6-C7. Upper chest: Negative. Other: None. IMPRESSION: CT head: 1. No acute intracranial abnormality. CT maxillofacial: 1. No acute maxillofacial fracture. Small nasal soft tissue laceration. 2. Chronic right maxillary sinusitis. New small air-fluid levels in the left maxillary and right sphenoid sinuses. Correlate for acute sinusitis. CT cervical spine: 1. No acute cervical spine fracture or traumatic listhesis. 2. Progressive now severe degenerative disc disease at C6-C7. Electronically Signed   By: Titus Dubin M.D.   On: 11/05/2020 17:41   ECHOCARDIOGRAM COMPLETE  Result Date: 11/06/2020    ECHOCARDIOGRAM REPORT   Patient Name:   Cassandra Adams [facility] Date of Exam: 11/06/2020 Medical Rec #:  [id number]            Height:       66.0 in Accession #:    [id number]           Weight:       173.1 lb Date of Birth:  Jan 22, 1946            BSA:          1.881 m Patient Age:    17 years             BP:           138/56 mmHg Patient Gender: F                    HR:           97 bpm. Exam Location:  [facility] Procedure: 2D Echo, Cardiac Doppler and Color Doppler Indications:    Syncope R55  History:        Patient has prior history of Echocardiogram examinations, most                 recent 05/05/2019. CAD and Previous Myocardial Infarction,  Arrythmias:Atrial Fibrillation; Risk Factors:Hypertension,                 Diabetes and Dyslipidemia.  Sonographer:    Alvino Chapel RCS Referring Phys: [id number] [place]  1. Left ventricular ejection fraction, by estimation, is 55 to 60%. The left ventricle has normal function. The left ventricle demonstrates regional wall motion abnormalities (see scoring diagram/findings for description). There is moderate asymmetric left ventricular hypertrophy of the basal and septal segments. Left ventricular diastolic parameters are indeterminate.  2. Right ventricular systolic function is normal. The right ventricular size is normal. There is normal pulmonary artery systolic pressure. The estimated right ventricular systolic pressure is 05.3 mmHg.  3. Left atrial size was mild to moderately dilated.  4. Possible small to moderate pericardial fluid collection at posterior base of LV. No obvious hemodynamic significance.  5. The mitral valve is abnormal, mildly calcified with restricted posterior leaflet motion. Mild mitral valve regurgitation.  6. The aortic valve is tricuspid. Aortic valve regurgitation is not visualized. Mild to moderate aortic valve sclerosis/calcification is present, without any evidence of aortic stenosis.  7. The inferior vena cava is normal in size with greater than 50% respiratory variability, suggesting right atrial pressure of 3 mmHg. FINDINGS  Left Ventricle: Left ventricular ejection fraction, by estimation, is 55 to 60%. The left ventricle has normal function. The left ventricle demonstrates regional wall motion abnormalities. The left ventricular internal cavity size was normal in size. There is moderate asymmetric left ventricular hypertrophy of the basal and septal segments. Left ventricular diastolic parameters are indeterminate.  LV Wall Scoring: The basal inferior segment is akinetic. The entire anterior wall, entire lateral wall, entire septum, entire apex, and mid and  distal inferior wall are normal. Right Ventricle: The right ventricular size is normal. No increase in right ventricular wall thickness. Right ventricular systolic function is normal. There is normal pulmonary artery systolic pressure. The tricuspid regurgitant velocity is 2.45 m/s, and  with an assumed right atrial pressure of 3 mmHg, the estimated right ventricular systolic pressure is 97.6 mmHg. Left Atrium: Left atrial size was mild to moderately dilated. Right Atrium: Right atrial size was normal in size. Pericardium: Possible small to moderate pericardial fluid collection at posterior base of LV. No obvious hemodynamic significance. The pericardium was not well visualized. Mitral Valve: The mitral valve is abnormal. There is mild calcification of the mitral valve leaflet(s). Mild mitral valve regurgitation. Tricuspid Valve: The tricuspid valve is grossly normal. Tricuspid valve regurgitation is mild. Aortic Valve: The aortic valve is tricuspid. There is mild aortic valve annular calcification. Aortic valve regurgitation is not visualized. Mild to moderate aortic valve sclerosis/calcification is present, without any evidence of aortic stenosis. Pulmonic Valve: The pulmonic valve was grossly normal. Pulmonic valve regurgitation is trivial. Aorta: The aortic root is normal in size and structure. Venous: The inferior vena cava is normal in size with greater than 50% respiratory variability, suggesting right atrial pressure of 3 mmHg. IAS/Shunts: No atrial level shunt detected by color flow Doppler.  LEFT VENTRICLE PLAX 2D LVIDd:         4.40 cm  Diastology LVIDs:         3.40 cm  LV e' medial:    6.20 cm/s LV PW:         1.20 cm  LV E/e' medial:  15.7 LV IVS:        1.80 cm  LV e' lateral:   9.79 cm/s LVOT diam:  1.90 cm  LV E/e' lateral: 9.9 LV SV:         50 LV SV Index:   27 LVOT Area:     2.84 cm  RIGHT VENTRICLE RV S prime:     8.22 cm/s TAPSE (M-mode): 1.3 cm LEFT ATRIUM             Index       RIGHT  ATRIUM           Index LA diam:        4.10 cm 2.18 cm/m  RA Area:     15.00 cm LA Vol (A2C):   80.5 ml 42.81 ml/m RA Volume:   36.60 ml  19.46 ml/m LA Vol (A4C):   75.3 ml 40.04 ml/m LA Biplane Vol: 77.8 ml 41.37 ml/m  AORTIC VALVE LVOT Vmax:   85.60 cm/s LVOT Vmean:  62.500 cm/s LVOT VTI:    0.178 m  AORTA Ao Root diam: 3.40 cm MITRAL VALVE               TRICUSPID VALVE MV Area (PHT): 3.60 cm    TR Peak grad:   24.0 mmHg MV Decel Time: 211 msec    TR Vmax:        245.00 cm/s MV E velocity: 97.30 cm/s                            SHUNTS                            Systemic VTI:  0.18 m                            Systemic Diam: 1.90 cm Rozann Lesches MD Electronically signed by Rozann Lesches MD Signature Date/Time: 11/06/2020/12:38:48 PM    Final    CT Maxillofacial Wo Contrast  Result Date: 11/05/2020 CLINICAL DATA:  Fall. EXAM: CT HEAD WITHOUT CONTRAST CT MAXILLOFACIAL WITHOUT CONTRAST CT CERVICAL SPINE WITHOUT CONTRAST TECHNIQUE: Multidetector CT imaging of the head, cervical spine, and maxillofacial structures were performed using the standard protocol without intravenous contrast. Multiplanar CT image reconstructions of the cervical spine and maxillofacial structures were also generated. COMPARISON:  CT head dated May 11, 2019. MRI head and cervical spine dated May 05, 2019. CT maxillofacial dated April 12, 2019. FINDINGS: CT HEAD FINDINGS Brain: No evidence of acute infarction, hemorrhage, hydrocephalus, extra-axial collection or mass lesion/mass effect. Stable mild atrophy. Vascular: Atherosclerotic vascular calcification of the carotid siphons. No hyperdense vessel. Skull: Normal. Negative for fracture or focal lesion. Other: None. CT MAXILLOFACIAL FINDINGS Osseous: No fracture or mandibular dislocation. No destructive process. Orbits: Negative. No traumatic or inflammatory finding. Sinuses: Chronic right maxillary sinusitis and mucoperiosteal thickening. New small air-fluid levels in  the left maxillary and right sphenoid sinuses. The mastoid air cells are clear. Soft tissues: Small nasal soft tissue laceration. CT CERVICAL SPINE FINDINGS Alignment: No traumatic malalignment. Unchanged focal reversal of the normal cervical lordosis at C5-C6. Skull base and vertebrae: No acute fracture. No primary bone lesion or focal pathologic process. C5-C6 ankylosis. Soft tissues and spinal canal: No prevertebral fluid or swelling. No visible canal hematoma. Disc levels:  Progressive now severe disc height loss at C6-C7. Upper chest: Negative. Other: None. IMPRESSION: CT head: 1. No acute intracranial abnormality. CT maxillofacial: 1. No acute maxillofacial fracture. Small nasal soft tissue laceration. 2.  Chronic right maxillary sinusitis. New small air-fluid levels in the left maxillary and right sphenoid sinuses. Correlate for acute sinusitis. CT cervical spine: 1. No acute cervical spine fracture or traumatic listhesis. 2. Progressive now severe degenerative disc disease at C6-C7. Electronically Signed   By: Titus Dubin M.D.   On: 11/05/2020 17:41      Subjective: Epistaxis has stopped, packing removed. Pt ambulated well with PT.  No further syncopal episodes. Pt has no complaints.  Feels well.    Discharge Exam: Vitals:   11/06/20 1000 11/06/20 1100  BP: (!) 126/43 (!) 123/43  Pulse: 74 77  Resp:    Temp:    SpO2: 95% 98%   Vitals:   11/06/20 0300 11/06/20 0440 11/06/20 1000 11/06/20 1100  BP: (!) 137/58  (!) 126/43 (!) 123/43  Pulse: 63  74 77  Resp: 16     Temp:  98.1 F (36.7 C)    TempSrc:  Oral    SpO2: 98%  95% 98%  Weight:  78.5 kg    Height:        General: Pt is alert, awake, not in acute distress Cardiovascular: normal S1/S2 +, no rubs, no gallops Respiratory: CTA bilaterally, no wheezing, no rhonchi Abdominal: Soft, NT, ND, bowel sounds + Extremities: no edema, no cyanosis   The results of significant diagnostics from this hospitalization (including  imaging, microbiology, ancillary and laboratory) are listed below for reference.     Microbiology: Recent Results (from the past 240 hour(s))  Resp Panel by RT-PCR (Flu A&B, Covid) Nasopharyngeal Swab     Status: None   Collection Time: 11/05/20  7:22 PM   Specimen: Nasopharyngeal Swab; Nasopharyngeal(NP) swabs in vial transport medium  Result Value Ref Range Status   SARS Coronavirus 2 by RT PCR NEGATIVE NEGATIVE Final    Comment: (NOTE) SARS-CoV-2 target nucleic acids are NOT DETECTED.  The SARS-CoV-2 RNA is generally detectable in upper respiratory specimens during the acute phase of infection. The lowest concentration of SARS-CoV-2 viral copies this assay can detect is 138 copies/mL. A negative result does not preclude SARS-Cov-2 infection and should not be used as the sole basis for treatment or other patient management decisions. A negative result may occur with  improper specimen collection/handling, submission of specimen other than nasopharyngeal swab, presence of viral mutation(s) within the areas targeted by this assay, and inadequate number of viral copies(<138 copies/mL). A negative result must be combined with clinical observations, patient history, and epidemiological information. The expected result is Negative.  Fact Sheet for Patients:  EntrepreneurPulse.com.au  Fact Sheet for Healthcare Providers:  IncredibleEmployment.be  This test is no t yet approved or cleared by the [place] FDA and  has been authorized for detection and/or diagnosis of SARS-CoV-2 by FDA under an Emergency Use Authorization (EUA). This EUA will remain  in effect (meaning this test can be used) for the duration of the COVID-19 declaration under Section 564(b)(1) of the Act, 21 U.S.C.section 360bbb-3(b)(1), unless the authorization is terminated  or revoked sooner.       Influenza A by PCR NEGATIVE NEGATIVE Final   Influenza B by PCR NEGATIVE  NEGATIVE Final    Comment: (NOTE) The Xpert Xpress SARS-CoV-2/FLU/RSV plus assay is intended as an aid in the diagnosis of influenza from Nasopharyngeal swab specimens and should not be used as a sole basis for treatment. Nasal washings and aspirates are unacceptable for Xpert Xpress SARS-CoV-2/FLU/RSV testing.  Fact Sheet for Patients: EntrepreneurPulse.com.au  Fact Sheet for Healthcare Providers:  IncredibleEmployment.be  This test is not yet approved or cleared by the [place] and has been authorized for detection and/or diagnosis of SARS-CoV-2 by FDA under an Emergency Use Authorization (EUA). This EUA will remain in effect (meaning this test can be used) for the duration of the COVID-19 declaration under Section 564(b)(1) of the Act, 21 U.S.C. section 360bbb-3(b)(1), unless the authorization is terminated or revoked.  Performed at [facility], [address] [place], [place] [address]   MRSA PCR Screening     Status: None   Collection Time: 11/05/20  9:19 PM   Specimen: Nasopharyngeal  Result Value Ref Range Status   MRSA by PCR NEGATIVE NEGATIVE Final    Comment:        The GeneXpert MRSA Assay (FDA approved for NASAL specimens only), is one component of a comprehensive MRSA colonization surveillance program. It is not intended to diagnose MRSA infection nor to guide or monitor treatment for MRSA infections. Performed at [facility], [address] [place], [place] [address]      Labs: BNP (last 3 results) No results for input(s): BNP in the last 8760 hours. Basic Metabolic Panel: Recent Labs  Lab 11/05/20 1550 11/05/20 1959  NA 139  --   K 4.2  --   CL 106  --   CO2 26  --   GLUCOSE 111*  --   BUN 33*  --   CREATININE 1.15*  --   CALCIUM 9.7  --   MG  --  1.9  PHOS  --  3.5   Liver Function Tests: Recent Labs  Lab 11/05/20 1550  AST 31  ALT 23  ALKPHOS 83  BILITOT 0.6  PROT 7.2  ALBUMIN 3.6    No results for input(s): LIPASE, AMYLASE in the last 168 hours. No results for input(s): AMMONIA in the last 168 hours. CBC: Recent Labs  Lab 11/05/20 1550  WBC 7.0  NEUTROABS 5.6  HGB 13.8  HCT 40.9  MCV 84.3  PLT 203   Cardiac Enzymes: Recent Labs  Lab 11/05/20 1550  CKTOTAL 223   BNP: Invalid input(s): POCBNP CBG: Recent Labs  Lab 11/05/20 1948 11/05/20 2156 11/06/20 0727 11/06/20 1148  GLUCAP 161* 190* 269* 374*   D-Dimer No results for input(s): DDIMER in the last 72 hours. Hgb A1c No results for input(s): HGBA1C in the last 72 hours. Lipid Profile No results for input(s): CHOL, HDL, LDLCALC, TRIG, CHOLHDL, LDLDIRECT in the last 72 hours. Thyroid function studies No results for input(s): TSH, T4TOTAL, T3FREE, THYROIDAB in the last 72 hours.  Invalid input(s): FREET3 Anemia work up No results for input(s): VITAMINB12, FOLATE, FERRITIN, TIBC, IRON, RETICCTPCT in the last 72 hours. Urinalysis    Component Value Date/Time   COLORURINE YELLOW 05/05/2019 0821   APPEARANCEUR CLEAR 05/05/2019 0821   LABSPEC 1.019 05/05/2019 0821   PHURINE 5.0 05/05/2019 0821   GLUCOSEU 150 (A) 05/05/2019 0821   HGBUR NEGATIVE 05/05/2019 0821   BILIRUBINUR NEGATIVE 05/05/2019 0821   KETONESUR NEGATIVE 05/05/2019 0821   PROTEINUR 30 (A) 05/05/2019 0821   UROBILINOGEN 0.2 09/15/2012 1010   NITRITE NEGATIVE 05/05/2019 0821   LEUKOCYTESUR LARGE (A) 05/05/2019 0821   Sepsis Labs Invalid input(s): PROCALCITONIN,  WBC,  LACTICIDVEN Microbiology Recent Results (from the past 240 hour(s))  Resp Panel by RT-PCR (Flu A&B, Covid) Nasopharyngeal Swab     Status: None   Collection Time: 11/05/20  7:22 PM   Specimen: Nasopharyngeal Swab; Nasopharyngeal(NP) swabs in vial transport medium  Result Value Ref Range Status   SARS Coronavirus 2 by RT PCR NEGATIVE NEGATIVE Final    Comment: (NOTE) SARS-CoV-2 target nucleic acids are NOT DETECTED.  The SARS-CoV-2 RNA is generally  detectable in upper respiratory specimens during the acute phase of infection. The lowest concentration of SARS-CoV-2 viral copies this assay can detect is 138 copies/mL. A negative result does not preclude SARS-Cov-2 infection and should not be used as the sole basis for treatment or other patient management decisions. A negative result may occur with  improper specimen collection/handling, submission of specimen other than nasopharyngeal swab, presence of viral mutation(s) within the areas targeted by this assay, and inadequate number of viral copies(<138 copies/mL). A negative result must be combined with clinical observations, patient history, and epidemiological information. The expected result is Negative.  Fact Sheet for Patients:  EntrepreneurPulse.com.au  Fact Sheet for Healthcare Providers:  IncredibleEmployment.be  This test is no t yet approved or cleared by the [place] FDA and  has been authorized for detection and/or diagnosis of SARS-CoV-2 by FDA under an Emergency Use Authorization (EUA). This EUA will remain  in effect (meaning this test can be used) for the duration of the COVID-19 declaration under Section 564(b)(1) of the Act, 21 U.S.C.section 360bbb-3(b)(1), unless the authorization is terminated  or revoked sooner.       Influenza A by PCR NEGATIVE NEGATIVE Final   Influenza B by PCR NEGATIVE NEGATIVE Final    Comment: (NOTE) The Xpert Xpress SARS-CoV-2/FLU/RSV plus assay is intended as an aid in the diagnosis of influenza from Nasopharyngeal swab specimens and should not be used as a sole basis for treatment. Nasal washings and aspirates are unacceptable for Xpert Xpress SARS-CoV-2/FLU/RSV testing.  Fact Sheet for Patients: EntrepreneurPulse.com.au  Fact Sheet for Healthcare Providers: IncredibleEmployment.be  This test is not yet approved or cleared by the [place] FDA  and has been authorized for detection and/or diagnosis of SARS-CoV-2 by FDA under an Emergency Use Authorization (EUA). This EUA will remain in effect (meaning this test can be used) for the duration of the COVID-19 declaration under Section 564(b)(1) of the Act, 21 U.S.C. section 360bbb-3(b)(1), unless the authorization is terminated or revoked.  Performed at [facility], [address], [place], [place] [address]   MRSA PCR Screening     Status: None   Collection Time: 11/05/20  9:19 PM   Specimen: Nasopharyngeal  Result Value Ref Range Status   MRSA by PCR NEGATIVE NEGATIVE Final    Comment:        The GeneXpert MRSA Assay (FDA approved for NASAL specimens only), is one component of a comprehensive MRSA colonization surveillance program. It is not intended to diagnose MRSA infection nor to guide or monitor treatment for MRSA infections. Performed at [facility], [address], [place], [place] [address]     Time coordinating discharge:   SIGNED:  Irwin Brakeman, MD  [facility] 11/06/2020, 12:52 PM How to contact the [facility] Attending or Consulting provider [place] or covering provider during after hours [place], for this patient?  1. Check the care team in [facility] and look for a) attending/consulting [facility] provider listed and b) the [facility] team listed 2. Log into [email or web address] and use [facility]'s universal password to access. If you do not have the password, please contact the hospital operator. 3. Locate the [facility] provider you are looking for under [facility] and page to a number that you can be directly reached. 4. If you still  have difficulty reaching the provider, please page the [facility] (Director on Call) for the Hospitalists listed on amion for assistance.

## 2020-11-06 NOTE — Plan of Care (Signed)
  Problem: Acute Rehab PT Goals(only PT should resolve) Goal: Pt Will Ambulate Flowsheets (Taken 11/06/2020 1214) Pt will Ambulate:  with least restrictive assistive device  > 125 feet Note: cane Goal: Pt Will Go Up/Down Stairs Flowsheets (Taken 11/06/2020 1214) Pt will Go Up / Down Stairs:  1-2 stairs  with rail(s)

## 2020-11-06 NOTE — Evaluation (Signed)
Physical Therapy Evaluation Patient Details Name: Cassandra Adams MRN: [id number] DOB: Feb 25, 1946 Today's Date: 11/06/2020   History of Present Illness  Cassandra Adams is a 75 y.o. female with medical history significant of osteoarthritis of multiple sites, CAD, history of NSTEMI, chronic diastolic heart failure with grade 2 diastolic dysfunction on 0254 echo, stage IIIa CKD, hypertension, migraine headaches, sickle cell trait, TIA, type II DM, vertigo who is coming to the emergency department after having a fall at home from her recliner hitting her face and sustaining a nasal contusion which has resulted in epistaxis.  She seems to have passed out, but is unable to provide any specific details.  She denies fever, chills, appetite changes, headache, rhinorrhea, sore throat, dyspnea, wheezing or hemoptysis.  No chest pain, palpitations, diaphoresis, PND, orthopnea or recent pitting edema of the lower extremities.  Denies abdominal pain, nausea, vomiting, diarrhea, constipation, melena or hematochezia.  No dysuria, frequency or hematuria.  No polyuria, polydipsia, polyphagia or blurred  Clinical Impression  PT lives alone states that her family checks in on her.  Pt is unsure what occurred; only pain at this time is headache.     Follow Up Recommendations No PT follow up    Equipment Recommendations  None recommended by PT    Recommendations for Other Services       Precautions / Restrictions Precautions Precautions: None      Mobility  Bed Mobility Overal bed mobility: Modified Independent                  Transfers Overall transfer level: Modified independent                  Ambulation/Gait Ambulation/Gait assistance: Modified independent (Device/Increase time) Gait Distance (Feet): 150 Feet Assistive device: Rolling walker (2 wheeled)     Gait velocity interpretation: <1.31 ft/sec, indicative of household ambulator            Pertinent  Vitals/Pain Pain Assessment: 0-10 Pain Score: 4  Pain Location: headache Pain Descriptors / Indicators: Aching Pain Intervention(s): Limited activity within patient's tolerance       Prior Function    Lives alone at home has a RW, cane and shower chair.              Hand Dominance        Extremity/Trunk Assessment        Lower Extremity Assessment Lower Extremity Assessment: Overall WFL for tasks assessed       Communication      Cognition Arousal/Alertness: Awake/alert   Overall Cognitive Status: Within Functional Limits for tasks assessed                                        General Comments  PT did not want to participate in therapy due to headache but managed to talk pt into at least walking.         Assessment/Plan    PT Assessment Patient needs continued PT services  PT Problem List Decreased activity tolerance       PT Treatment Interventions Therapeutic activities    PT Goals (Current goals can be found in the Care Plan section)  Acute Rehab PT Goals Patient Stated Goal: To go home PT Goal Formulation: With patient Potential to Achieve Goals: Good    Frequency Min 2X/week           AM-PAC  PT "6 Clicks" Mobility  Outcome Measure Help needed turning from your back to your side while in a flat bed without using bedrails?: None Help needed moving from lying on your back to sitting on the side of a flat bed without using bedrails?: None Help needed moving to and from a bed to a chair (including a wheelchair)?: None Help needed standing up from a chair using your arms (e.g., wheelchair or bedside chair)?: None Help needed to walk in hospital room?: A Little Help needed climbing 3-5 steps with a railing? : A Lot 6 Click Score: 21    End of Session Equipment Utilized During Treatment: Gait belt Activity Tolerance: Patient tolerated treatment well Patient left: in bed Nurse Communication: Mobility status PT Visit  Diagnosis: Muscle weakness (generalized) (M62.81)    Time: 3709-6438 PT Time Calculation (min) (ACUTE ONLY): 19 min   Charges:   PT Evaluation $PT Eval Low Complexity: [place], PT CLT [phone] 11/06/2020, 12:15 PM

## 2020-11-06 NOTE — Progress Notes (Signed)
*  PRELIMINARY RESULTS* Echocardiogram 2D Echocardiogram has been performed.  Cassandra Adams 11/06/2020, 10:11 AM

## 2020-11-06 NOTE — Progress Notes (Signed)
Patient being discharged to home. No further bleeding from the nose noted. Patient was given information about bleeding with current medications she is on. IV access removed and Nurse tech is helping her bath and get dressed for discharge.

## 2020-11-07 ENCOUNTER — Emergency Department (HOSPITAL_COMMUNITY): Payer: Medicare HMO

## 2020-11-07 ENCOUNTER — Other Ambulatory Visit: Payer: Self-pay

## 2020-11-07 ENCOUNTER — Encounter (HOSPITAL_COMMUNITY): Payer: Self-pay

## 2020-11-07 ENCOUNTER — Inpatient Hospital Stay (HOSPITAL_COMMUNITY)
Admission: EM | Admit: 2020-11-07 | Discharge: 2020-11-11 | DRG: 813 | Disposition: A | Discharge status: Home / Self Care | Payer: Medicare HMO | Attending: Internal Medicine | Admitting: Internal Medicine

## 2020-11-07 DIAGNOSIS — W19XXXA Unspecified fall, initial encounter: Secondary | ICD-10-CM | POA: Diagnosis present

## 2020-11-07 DIAGNOSIS — D573 Sickle-cell trait: Secondary | ICD-10-CM | POA: Diagnosis not present

## 2020-11-07 DIAGNOSIS — Z20822 Contact with and (suspected) exposure to covid-19: Secondary | ICD-10-CM | POA: Diagnosis not present

## 2020-11-07 DIAGNOSIS — Z83438 Family history of other disorder of lipoprotein metabolism and other lipidemia: Secondary | ICD-10-CM | POA: Diagnosis not present

## 2020-11-07 DIAGNOSIS — R079 Chest pain, unspecified: Secondary | ICD-10-CM | POA: Diagnosis not present

## 2020-11-07 DIAGNOSIS — R040 Epistaxis: Secondary | ICD-10-CM | POA: Diagnosis present

## 2020-11-07 DIAGNOSIS — I130 Hypertensive heart and chronic kidney disease with heart failure and stage 1 through stage 4 chronic kidney disease, or unspecified chronic kidney disease: Secondary | ICD-10-CM | POA: Diagnosis not present

## 2020-11-07 DIAGNOSIS — I959 Hypotension, unspecified: Secondary | ICD-10-CM | POA: Diagnosis present

## 2020-11-07 DIAGNOSIS — Z7901 Long term (current) use of anticoagulants: Secondary | ICD-10-CM

## 2020-11-07 DIAGNOSIS — I482 Chronic atrial fibrillation, unspecified: Secondary | ICD-10-CM | POA: Insufficient documentation

## 2020-11-07 DIAGNOSIS — G43909 Migraine, unspecified, not intractable, without status migrainosus: Secondary | ICD-10-CM | POA: Diagnosis not present

## 2020-11-07 DIAGNOSIS — Z8673 Personal history of transient ischemic attack (TIA), and cerebral infarction without residual deficits: Secondary | ICD-10-CM | POA: Diagnosis not present

## 2020-11-07 DIAGNOSIS — Z7982 Long term (current) use of aspirin: Secondary | ICD-10-CM

## 2020-11-07 DIAGNOSIS — E1165 Type 2 diabetes mellitus with hyperglycemia: Secondary | ICD-10-CM | POA: Diagnosis present

## 2020-11-07 DIAGNOSIS — I2510 Atherosclerotic heart disease of native coronary artery without angina pectoris: Secondary | ICD-10-CM | POA: Diagnosis not present

## 2020-11-07 DIAGNOSIS — K3189 Other diseases of stomach and duodenum: Secondary | ICD-10-CM | POA: Diagnosis not present

## 2020-11-07 DIAGNOSIS — Z833 Family history of diabetes mellitus: Secondary | ICD-10-CM

## 2020-11-07 DIAGNOSIS — E1122 Type 2 diabetes mellitus with diabetic chronic kidney disease: Secondary | ICD-10-CM | POA: Diagnosis present

## 2020-11-07 DIAGNOSIS — Z8249 Family history of ischemic heart disease and other diseases of the circulatory system: Secondary | ICD-10-CM | POA: Diagnosis not present

## 2020-11-07 DIAGNOSIS — R109 Unspecified abdominal pain: Secondary | ICD-10-CM | POA: Diagnosis not present

## 2020-11-07 DIAGNOSIS — I480 Paroxysmal atrial fibrillation: Secondary | ICD-10-CM | POA: Diagnosis present

## 2020-11-07 DIAGNOSIS — R031 Nonspecific low blood-pressure reading: Secondary | ICD-10-CM | POA: Diagnosis not present

## 2020-11-07 DIAGNOSIS — T45515A Adverse effect of anticoagulants, initial encounter: Secondary | ICD-10-CM | POA: Diagnosis present

## 2020-11-07 DIAGNOSIS — K319 Disease of stomach and duodenum, unspecified: Secondary | ICD-10-CM | POA: Diagnosis present

## 2020-11-07 DIAGNOSIS — R778 Other specified abnormalities of plasma proteins: Secondary | ICD-10-CM | POA: Diagnosis present

## 2020-11-07 DIAGNOSIS — I5032 Chronic diastolic (congestive) heart failure: Secondary | ICD-10-CM | POA: Diagnosis present

## 2020-11-07 DIAGNOSIS — Z96651 Presence of right artificial knee joint: Secondary | ICD-10-CM | POA: Diagnosis present

## 2020-11-07 DIAGNOSIS — I7789 Other specified disorders of arteries and arterioles: Secondary | ICD-10-CM | POA: Diagnosis not present

## 2020-11-07 DIAGNOSIS — N1831 Chronic kidney disease, stage 3a: Secondary | ICD-10-CM | POA: Diagnosis not present

## 2020-11-07 DIAGNOSIS — R1031 Right lower quadrant pain: Secondary | ICD-10-CM | POA: Diagnosis not present

## 2020-11-07 DIAGNOSIS — N179 Acute kidney failure, unspecified: Secondary | ICD-10-CM | POA: Diagnosis present

## 2020-11-07 DIAGNOSIS — R0789 Other chest pain: Secondary | ICD-10-CM | POA: Diagnosis not present

## 2020-11-07 DIAGNOSIS — I252 Old myocardial infarction: Secondary | ICD-10-CM

## 2020-11-07 DIAGNOSIS — E875 Hyperkalemia: Secondary | ICD-10-CM | POA: Diagnosis not present

## 2020-11-07 DIAGNOSIS — K920 Hematemesis: Secondary | ICD-10-CM | POA: Diagnosis present

## 2020-11-07 DIAGNOSIS — S0033XA Contusion of nose, initial encounter: Secondary | ICD-10-CM | POA: Diagnosis present

## 2020-11-07 DIAGNOSIS — I700 Atherosclerosis of aorta: Secondary | ICD-10-CM | POA: Diagnosis not present

## 2020-11-07 DIAGNOSIS — D6832 Hemorrhagic disorder due to extrinsic circulating anticoagulants: Secondary | ICD-10-CM | POA: Diagnosis not present

## 2020-11-07 DIAGNOSIS — N184 Chronic kidney disease, stage 4 (severe): Secondary | ICD-10-CM | POA: Diagnosis present

## 2020-11-07 DIAGNOSIS — R55 Syncope and collapse: Secondary | ICD-10-CM | POA: Diagnosis present

## 2020-11-07 DIAGNOSIS — Z794 Long term (current) use of insulin: Secondary | ICD-10-CM

## 2020-11-07 DIAGNOSIS — I517 Cardiomegaly: Secondary | ICD-10-CM | POA: Diagnosis not present

## 2020-11-07 DIAGNOSIS — N189 Chronic kidney disease, unspecified: Secondary | ICD-10-CM | POA: Diagnosis not present

## 2020-11-07 DIAGNOSIS — J320 Chronic maxillary sinusitis: Secondary | ICD-10-CM | POA: Diagnosis present

## 2020-11-07 DIAGNOSIS — Z801 Family history of malignant neoplasm of trachea, bronchus and lung: Secondary | ICD-10-CM

## 2020-11-07 DIAGNOSIS — N1832 Chronic kidney disease, stage 3b: Secondary | ICD-10-CM | POA: Diagnosis present

## 2020-11-07 DIAGNOSIS — M1990 Unspecified osteoarthritis, unspecified site: Secondary | ICD-10-CM | POA: Diagnosis not present

## 2020-11-07 DIAGNOSIS — K5900 Constipation, unspecified: Secondary | ICD-10-CM | POA: Diagnosis not present

## 2020-11-07 DIAGNOSIS — D62 Acute posthemorrhagic anemia: Secondary | ICD-10-CM | POA: Diagnosis not present

## 2020-11-07 DIAGNOSIS — Z79899 Other long term (current) drug therapy: Secondary | ICD-10-CM

## 2020-11-07 DIAGNOSIS — I129 Hypertensive chronic kidney disease with stage 1 through stage 4 chronic kidney disease, or unspecified chronic kidney disease: Secondary | ICD-10-CM | POA: Diagnosis not present

## 2020-11-07 LAB — TYPE AND SCREEN
ABO/RH(D): O POS
Antibody Screen: NEGATIVE

## 2020-11-07 LAB — GLUCOSE, CAPILLARY: Glucose-Capillary: 154 mg/dL — ABNORMAL HIGH (ref 70–99)

## 2020-11-07 LAB — BASIC METABOLIC PANEL
Anion gap: 7 (ref 5–15)
BUN: 51 mg/dL — ABNORMAL HIGH (ref 8–23)
CO2: 23 mmol/L (ref 22–32)
Calcium: 9.9 mg/dL (ref 8.9–10.3)
Chloride: 109 mmol/L (ref 98–111)
Creatinine, Ser: 1.64 mg/dL — ABNORMAL HIGH (ref 0.44–1.00)
GFR, Estimated: 32 mL/min — ABNORMAL LOW (ref >60)
Glucose, Bld: 161 mg/dL — ABNORMAL HIGH (ref 70–99)
Potassium: 4.4 mmol/L (ref 3.5–5.1)
Sodium: 139 mmol/L (ref 135–145)

## 2020-11-07 LAB — TROPONIN I (HIGH SENSITIVITY)
Troponin I (High Sensitivity): 41 ng/L — ABNORMAL HIGH (ref <18)
Troponin I (High Sensitivity): 34 ng/L — ABNORMAL HIGH (ref <18)

## 2020-11-07 LAB — CBC
HCT: 28.6 % — ABNORMAL LOW (ref 36.0–46.0)
Hemoglobin: 9.9 g/dL — ABNORMAL LOW (ref 12.0–15.0)
MCH: 28.4 pg (ref 26.0–34.0)
MCHC: 34.6 g/dL (ref 30.0–36.0)
MCV: 82.2 fL (ref 80.0–100.0)
Platelets: 178 10*3/uL (ref 150–400)
RBC: 3.48 MIL/uL — ABNORMAL LOW (ref 3.87–5.11)
RDW: 14.6 % (ref 11.5–15.5)
WBC: 4.7 10*3/uL (ref 4.0–10.5)
nRBC: 0 % (ref 0.0–0.2)

## 2020-11-07 LAB — HEMOGLOBIN AND HEMATOCRIT, BLOOD
HCT: 27.4 % — ABNORMAL LOW (ref 36.0–46.0)
HCT: 25.6 % — ABNORMAL LOW (ref 36.0–46.0)
Hemoglobin: 9.5 g/dL — ABNORMAL LOW (ref 12.0–15.0)
Hemoglobin: 8.8 g/dL — ABNORMAL LOW (ref 12.0–15.0)

## 2020-11-07 LAB — CK: Total CK: 117 U/L (ref 38–234)

## 2020-11-07 LAB — CBG MONITORING, ED: Glucose-Capillary: 194 mg/dL — ABNORMAL HIGH (ref 70–99)

## 2020-11-07 MED ORDER — CHLORHEXIDINE GLUCONATE CLOTH 2 % EX PADS: 6.0000 | MEDICATED_PAD | Freq: Every day | CUTANEOUS | Status: DC

## 2020-11-07 MED ORDER — PANTOPRAZOLE SODIUM 40 MG IV SOLR: 40.0000 mg | Freq: Two times a day (BID) | INTRAVENOUS | Status: DC

## 2020-11-07 MED ORDER — SODIUM CHLORIDE 0.9 % IV SOLN
8.0000 mg/h | INTRAVENOUS | Status: DC
  Administered 2020-11-07 – 2020-11-09 (×3): 8 mg/h via INTRAVENOUS
  Filled 2020-11-07 (×5): qty 80

## 2020-11-07 MED ORDER — INSULIN ASPART 100 UNIT/ML IJ SOLN: 0.0000 [IU] | INTRAMUSCULAR | Status: DC

## 2020-11-07 MED ORDER — INSULIN ASPART 100 UNIT/ML IJ SOLN
0.0000 [IU] | Freq: Four times a day (QID) | INTRAMUSCULAR | Status: DC
  Administered 2020-11-08: 5 [IU] via SUBCUTANEOUS
  Administered 2020-11-08: 1 [IU] via SUBCUTANEOUS
  Administered 2020-11-09: 5 [IU] via SUBCUTANEOUS

## 2020-11-07 MED ORDER — INSULIN ASPART 100 UNIT/ML IJ SOLN
0.0000 [IU] | Freq: Four times a day (QID) | INTRAMUSCULAR | Status: DC
Start: 2020-11-07 — End: 2020-11-07
  Administered 2020-11-07: 3 [IU] via SUBCUTANEOUS

## 2020-11-07 MED ORDER — INSULIN ASPART 100 UNIT/ML IJ SOLN
0.0000 [IU] | INTRAMUSCULAR | Status: DC
  Administered 2020-11-07: 1 [IU] via SUBCUTANEOUS

## 2020-11-07 MED ORDER — SODIUM CHLORIDE 0.9 % IV SOLN: INTRAVENOUS | Status: DC

## 2020-11-07 MED ORDER — PROTHROMBIN COMPLEX CONC HUMAN 500 UNITS IV KIT
4202.0000 [IU] | PACK | Status: AC
  Administered 2020-11-07: 4202 [IU] via INTRAVENOUS
  Filled 2020-11-07: qty 4202

## 2020-11-07 MED ORDER — SODIUM CHLORIDE 0.9% FLUSH
10.0000 mL | Freq: Two times a day (BID) | INTRAVENOUS | Status: DC
  Administered 2020-11-08 – 2020-11-11 (×5): 10 mL

## 2020-11-07 MED ORDER — SODIUM CHLORIDE 0.9 % IV SOLN
80.0000 mg | Freq: Once | INTRAVENOUS | Status: AC
  Administered 2020-11-07: 80 mg via INTRAVENOUS
  Filled 2020-11-07: qty 80

## 2020-11-07 MED ORDER — ONDANSETRON HCL 4 MG/2ML IJ SOLN
4.0000 mg | Freq: Once | INTRAMUSCULAR | Status: AC
  Administered 2020-11-07: 4 mg via INTRAVENOUS
  Filled 2020-11-07: qty 2

## 2020-11-07 MED ORDER — SODIUM CHLORIDE 0.9% FLUSH: 10.0000 mL | INTRAVENOUS | Status: DC | PRN

## 2020-11-07 NOTE — H&P (Addendum)
.  History and Physical    Cassandra Adams:[id number] DOB: [birth year] DOA: 11/07/2020  Referring MD/NP/PA: Davonna Belling PCP: Sharilyn Sites, MD  Patient coming from: Home  Chief Complaint: Chest pain  I have personally briefly reviewed patient's old medical records in [place]   HPI: Cassandra Adams is a 75 y.o. female with medical history significant of hypertension, TIA, atrial fibrillation on Eliquis, CAD, diabetes mellitus type 2, sickle cell trait, and chronic kidney disease stage III presents with complaints of chest pain nausea and vomiting.  She had been seen at [facility] after having a suspected syncopal episode sustaining a abrasion to her forehead and nose with epistaxis.  The patient has no recollection of what caused her to fall yesterday.  It appears patient was evaluated and had negative CT scan of the head and cervical spine.   echocardiogram which showed EF of 55 to 60% with indeterminate diastolic parameters.  She had told the provider prior to being discharged home that she did not feel good and felt she should not leave.  However, patient was discharged home.  When she woke up this morning she reported having a headache, nausea, and left-sided chest pain.  Chest pain is worse with certain movements and on palpation.  Later on patient developed vomiting.  Emesis was reportedly coffee-ground in appearance.  Patient is on Eliquis and took all of her medicine this morning including Eliquis.  She denies any use of NSAIDs, abdominal pain, dysuria, or dark stools.  She reports that she does not want any blood products even if it could result in her passing.  She is not Jehovah's Witness, but has a strong conviction of not excepting blood products.  Addendum: Patient's daughter was updated in regards to the patient's condition and her grandson called her shortly thereafter. Patient is now acceptable to blood products, if needed.  ED Course: Upon admission  into the emergency department patient was seen to be afebrile with blood pressures as low as 90/43-126/43, respirations 14-25, and all other vital signs maintained.  Labs significant for hemoglobin 9.9(previously 13.8 on 5/13), BUN 51, and creatinine 1.64.  Patient was given Zofran for nausea.  CT scan of the chest significant for cardiomegaly and CAD without any signs of any rib fractures.  TRH called to admit for chest pain.   Review of Systems  Constitutional: Positive for malaise/fatigue. Negative for fever.  HENT: Negative for ear discharge and nosebleeds.   Eyes: Negative for photophobia and pain.  Respiratory: Negative for cough and shortness of breath.   Cardiovascular: Positive for chest pain. Negative for leg swelling.  Gastrointestinal: Positive for nausea and vomiting. Negative for abdominal pain, blood in stool and diarrhea.  Genitourinary: Negative for dysuria and frequency.  Musculoskeletal: Positive for falls.  Neurological: Positive for loss of consciousness and weakness.  Psychiatric/Behavioral: Positive for memory loss. Negative for substance abuse.    Past Medical History:  Diagnosis Date  . Arthritis    "all over"  . CAD (coronary artery disease)    a. NSTEMI 12/2013 - occluded dominant RCA with L-R collaterals, moderate prox segmental LAD disease, for medical therapy initially, EF 60% with subtle inferobasal hypokinesia. Consider PCI for refractory CP.  [name] CKD (chronic kidney disease), stage III ([place])   . Hypertension   . Migraines    "weekly" (01/06/2014)  . Sickle cell trait ([place])   . TIA (transient ischemic attack) 1978  . Type II diabetes mellitus ([place])   .  Vertigo     Past Surgical History:  Procedure Laterality Date  . APPENDECTOMY  March 2014   had appendix frozen  . CARDIAC CATHETERIZATION  "years ago" & 01/05/2014  . CARDIOVERSION N/A 03/17/2014   Procedure: CARDIOVERSION;  Surgeon: [name], MD;  Location: [facility] ENDOSCOPY;  Service: Cardiovascular;   Laterality: N/A;  . CATARACT EXTRACTION W/ INTRAOCULAR LENS  IMPLANT, BILATERAL Bilateral 04/2013-05/2013  . ENDARTERECTOMY Right 02/05/2017   Procedure: ENDARTERECTOMY CAROTID-RIGHT;  Surgeon: [name], MD;  Location: [facility] OR;  Service: Vascular;  Laterality: Right;  . ESOPHAGOGASTRODUODENOSCOPY  11/08/2007    Normal esophagus without evidence of Barrett, mass, erosion/ Normal stomach, duodenal bulb  . GASTRIC MOTILITY STUDY  11/13/2007   mildly delayed emptying subjectively, but normal  analysis-77% of tracer emptied at 2 hours  . JOINT REPLACEMENT    . LAPAROSCOPIC APPENDECTOMY N/A 09/15/2012   Procedure: APPENDECTOMY LAPAROSCOPIC;  Surgeon: [name], MD;  Location: [facility] ORS;  Service: General;  Laterality: N/A;  . LEFT HEART CATHETERIZATION WITH CORONARY ANGIOGRAM N/A 01/05/2014   Procedure: LEFT HEART CATHETERIZATION WITH CORONARY ANGIOGRAM;  Surgeon: [name], MD;  Location: [facility] CATH LAB;  Service: Cardiovascular;  Laterality: N/A;  . REPLACEMENT TOTAL KNEE Right 05-03-06  . TEE WITHOUT CARDIOVERSION N/A 03/17/2014   Procedure: TRANSESOPHAGEAL ECHOCARDIOGRAM (TEE);  Surgeon: [name], MD;  Location: [facility] ENDOSCOPY;  Service: Cardiovascular;  Laterality: N/A;  . TONSILLECTOMY  1972     reports that she has never smoked. She has never used smokeless tobacco. She reports that she does not drink alcohol and does not use drugs.  Allergies  Allergen Reactions  . Penicillins Hives    Has patient had a PCN reaction causing immediate rash, facial/tongue/throat swelling, SOB or lightheadedness with hypotension: no Has patient had a PCN reaction causing severe rash involving mucus membranes or skin necrosis: No  Has patient had a PCN reaction that required hospitalization: no Has patient had a PCN reaction occurring within the last 10 years: no If all of the above answers are "NO", then may proceed with Cephalosporin use.     Family History  Problem Relation Age of  Onset  . Hyperlipidemia Mother   . Hypertension Mother   . Heart attack Mother   . Cancer Father        lung  . Hypertension Sister   . Diabetes Brother   . Heart disease Brother   . Hyperlipidemia Brother   . Hypertension Brother   . Heart attack Brother   . Other Brother        DVT    Prior to Admission medications   Medication Sig Start Date End Date Taking? Authorizing Provider  aspirin EC 81 MG tablet Take 1 tablet (81 mg total) by mouth daily. 11/08/20   Johnson, Clanford L, MD  atorvastatin (LIPITOR) 80 MG tablet Take 1 tablet (80 mg total) by mouth every evening. 11/26/17   Hilty, Nadean Corwin, MD  B-D ULTRAFINE III SHORT PEN 31G X 8 MM MISC SMARTSIG:Injection Daily 07/28/20   [provider]  BD INSULIN SYRINGE U/F 31G X 5/16" 0.5 ML MISC SMARTSIG:Injection 4 Times Daily 09/23/20   [provider]  carvedilol (COREG) 3.125 MG tablet Take 3.125 mg by mouth 2 (two) times daily with a meal.    [provider]  colesevelam (WELCHOL) 625 MG tablet Take 1,875 mg by mouth 2 (two) times daily with a meal. *May take 6 tablets once a day with meals*  [provider]  ELIQUIS 5 MG TABS tablet Take 1 tablet (5 mg total) by mouth 2 (two) times daily. 11/07/20   Johnson, Clanford L, MD  furosemide (LASIX) 40 MG tablet Take 40 mg by mouth daily.  10/25/17   [provider]  glipiZIDE (GLUCOTROL XL) 2.5 MG 24 hr tablet Take 1 tablet (2.5 mg total) by mouth daily with breakfast. 02/12/20   Brita Romp, NP  hydrALAZINE (APRESOLINE) 25 MG tablet Take 25 mg by mouth 3 (three) times daily. 09/12/20   [provider]  Insulin Glargine (BASAGLAR KWIKPEN) 100 UNIT/ML Inject 32 Units into the skin at bedtime. 10/14/20   Brita Romp, NP  lisinopril (ZESTRIL) 20 MG tablet Take 1 tablet (20 mg total) by mouth daily. 07/08/20   Hilty, Nadean Corwin, MD  nitroGLYCERIN (NITROSTAT) 0.4 MG SL tablet Place 1 tablet (0.4 mg total) under the tongue every 5 (five)  minutes as needed for chest pain (up to 3 doses). 11/26/17   Hilty, Nadean Corwin, MD  NOVOLOG 100 UNIT/ML injection Inject 6-9 Units into the skin 3 (three) times daily with meals. 07/28/20   [provider]  [facility] ULTRA test strip  10/08/20   [provider]  Vitamin D, Cholecalciferol, 25 MCG (1000 UT) TABS Take 2,000 Units by mouth daily. Start after you finish 6 weeks of vit D 50,000    [provider]    Physical Exam:  Constitutional: Elderly female currently in no acute distress Vitals:   11/07/20 1318 11/07/20 1415 11/07/20 1445 11/07/20 1515  BP: 111/63 140/64  (!) 136/42  Pulse: 79 68 79 63  Resp: 20 18 19  (!) 25  Temp: 98 F (36.7 C)     TempSrc: Oral     SpO2: 100% 99% 100% 100%   Eyes: PERRL, lids and conjunctivae normal ENMT: Mucous membranes are dry. Posterior pharynx clear of any exudate or lesions.  Neck: normal, supple, no masses, no thyromegaly Respiratory: clear to auscultation bilaterally, no wheezing, no crackles. Normal respiratory effort. No accessory muscle use.  Cardiovascular: Irregular irregular and tachycardic, no murmurs / rubs / gallops. No extremity edema. 2+ pedal pulses. No carotid bruits.  Abdomen: no tenderness, no masses palpated. No hepatosplenomegaly. Bowel sounds positive.  Musculoskeletal: no clubbing / cyanosis. No joint deformity upper and lower extremities. Good ROM, no contractures. Normal muscle tone.  Skin: no rashes, lesions, ulcers. No induration Neurologic: CN 2-12 grossly intact. Sensation intact, DTR normal. Strength 5/5 in all 4.  Psychiatric: Normal judgment and insight. Alert and oriented x 3. Normal mood.     Labs on Admission: I have personally reviewed following labs and imaging studies  CBC: Recent Labs  Lab 11/05/20 1550 11/07/20 1132  WBC 7.0 4.7  NEUTROABS 5.6  --   HGB 13.8 9.9*  HCT 40.9 28.6*  MCV 84.3 82.2  PLT 203 992   Basic Metabolic Panel: Recent Labs  Lab 11/05/20 1550  11/05/20 1959 11/07/20 1132  NA 139  --  139  K 4.2  --  4.4  CL 106  --  109  CO2 26  --  23  GLUCOSE 111*  --  161*  BUN 33*  --  51*  CREATININE 1.15*  --  1.64*  CALCIUM 9.7  --  9.9  MG  --  1.9  --   PHOS  --  3.5  --    GFR: Estimated Creatinine Clearance: 30.9 mL/min (A) (by C-G formula based on SCr of 1.64  mg/dL (H)). Liver Function Tests: Recent Labs  Lab 11/05/20 1550  AST 31  ALT 23  ALKPHOS 83  BILITOT 0.6  PROT 7.2  ALBUMIN 3.6   No results for input(s): LIPASE, AMYLASE in the last 168 hours. No results for input(s): AMMONIA in the last 168 hours. Coagulation Profile: Recent Labs  Lab 11/05/20 1959 11/06/20 0512  INR 1.1 1.4*   Cardiac Enzymes: Recent Labs  Lab 11/05/20 1550  CKTOTAL 223   BNP (last 3 results) No results for input(s): PROBNP in the last 8760 hours. HbA1C: Recent Labs    11/06/20 0512  HGBA1C 8.2*   CBG: Recent Labs  Lab 11/05/20 1948 11/05/20 2156 11/06/20 0727 11/06/20 1148 11/06/20 1606  GLUCAP 161* 190* 269* 374* 181*   Lipid Profile: No results for input(s): CHOL, HDL, LDLCALC, TRIG, CHOLHDL, LDLDIRECT in the last 72 hours. Thyroid Function Tests: No results for input(s): TSH, T4TOTAL, FREET4, T3FREE, THYROIDAB in the last 72 hours. Anemia Panel: No results for input(s): VITAMINB12, FOLATE, FERRITIN, TIBC, IRON, RETICCTPCT in the last 72 hours. Urine analysis:    Component Value Date/Time   COLORURINE YELLOW 05/05/2019 0821   APPEARANCEUR CLEAR 05/05/2019 0821   LABSPEC 1.019 05/05/2019 0821   PHURINE 5.0 05/05/2019 0821   GLUCOSEU 150 (A) 05/05/2019 0821   HGBUR NEGATIVE 05/05/2019 0821   BILIRUBINUR NEGATIVE 05/05/2019 0821   KETONESUR NEGATIVE 05/05/2019 0821   PROTEINUR 30 (A) 05/05/2019 0821   UROBILINOGEN 0.2 09/15/2012 1010   NITRITE NEGATIVE 05/05/2019 0821   LEUKOCYTESUR LARGE (A) 05/05/2019 0821   Sepsis Labs: Recent Results (from the past 240 hour(s))  Resp Panel by RT-PCR (Flu A&B,  Covid) Nasopharyngeal Swab     Status: None   Collection Time: 11/05/20  7:22 PM   Specimen: Nasopharyngeal Swab; Nasopharyngeal(NP) swabs in vial transport medium  Result Value Ref Range Status   SARS Coronavirus 2 by RT PCR NEGATIVE NEGATIVE Final    Comment: (NOTE) SARS-CoV-2 target nucleic acids are NOT DETECTED.  The SARS-CoV-2 RNA is generally detectable in upper respiratory specimens during the acute phase of infection. The lowest concentration of SARS-CoV-2 viral copies this assay can detect is 138 copies/mL. A negative result does not preclude SARS-Cov-2 infection and should not be used as the sole basis for treatment or other patient management decisions. A negative result may occur with  improper specimen collection/handling, submission of specimen other than nasopharyngeal swab, presence of viral mutation(s) within the areas targeted by this assay, and inadequate number of viral copies(<138 copies/mL). A negative result must be combined with clinical observations, patient history, and epidemiological information. The expected result is Negative.  Fact Sheet for Patients:  EntrepreneurPulse.com.au  Fact Sheet for Healthcare Providers:  IncredibleEmployment.be  This test is no t yet approved or cleared by the [place] FDA and  has been authorized for detection and/or diagnosis of SARS-CoV-2 by FDA under an Emergency Use Authorization (EUA). This EUA will remain  in effect (meaning this test can be used) for the duration of the COVID-19 declaration under Section 564(b)(1) of the Act, 21 U.S.C.section 360bbb-3(b)(1), unless the authorization is terminated  or revoked sooner.       Influenza A by PCR NEGATIVE NEGATIVE Final   Influenza B by PCR NEGATIVE NEGATIVE Final    Comment: (NOTE) The Xpert Xpress SARS-CoV-2/FLU/RSV plus assay is intended as an aid in the diagnosis of influenza from Nasopharyngeal swab specimens and should  not be used as a sole basis for treatment. Nasal washings and  aspirates are unacceptable for Xpert Xpress SARS-CoV-2/FLU/RSV testing.  Fact Sheet for Patients: EntrepreneurPulse.com.au  Fact Sheet for Healthcare Providers: IncredibleEmployment.be  This test is not yet approved or cleared by the [place] FDA and has been authorized for detection and/or diagnosis of SARS-CoV-2 by FDA under an Emergency Use Authorization (EUA). This EUA will remain in effect (meaning this test can be used) for the duration of the COVID-19 declaration under Section 564(b)(1) of the Act, 21 U.S.C. section 360bbb-3(b)(1), unless the authorization is terminated or revoked.  Performed at [facility], [address] [place], [place] [address]   MRSA PCR Screening     Status: None   Collection Time: 11/05/20  9:19 PM   Specimen: Nasopharyngeal  Result Value Ref Range Status   MRSA by PCR NEGATIVE NEGATIVE Final    Comment:        The GeneXpert MRSA Assay (FDA approved for NASAL specimens only), is one component of a comprehensive MRSA colonization surveillance program. It is not intended to diagnose MRSA infection nor to guide or monitor treatment for MRSA infections. Performed at [facility], [address] [place], [place] [address]      Radiological Exams on Admission: DG Chest 2 View  Result Date: 11/07/2020 CLINICAL DATA:  Chest pain and emesis. EXAM: CHEST - 2 VIEW COMPARISON:  11/05/2020 FINDINGS: Normal heart size. Aortic atherosclerosis. Asymmetric elevation of right hemidiaphragm is unchanged. The visualized osseous structures are unremarkable. IMPRESSION: No active cardiopulmonary abnormalities. Electronically Signed   By: Kerby Moors M.D.   On: 11/07/2020 12:28   DG Chest 2 View  Result Date: 11/05/2020 CLINICAL DATA:  75 year old who fell earlier today. Initial encounter. Current history of hypertension and diabetes. EXAM: CHEST - 2 VIEW  COMPARISON:  04/12/2019 and earlier. FINDINGS: AP ERECT and LATERAL images were obtained. Suboptimal inspiration accounts for crowded bronchovascular markings, especially in the bases, and accentuates the cardiac silhouette. Taking this into account, cardiac silhouette mildly enlarged and stable. Thoracic aorta atherosclerotic, unchanged. Hilar and mediastinal contours otherwise unremarkable. Lungs clear. Bronchovascular markings normal. Pulmonary vascularity normal. No visible pleural effusions. No pneumothorax. Stable chronic elevation of the RIGHT hemidiaphragm. Degenerative changes involving the lower thoracic and upper lumbar spine. IMPRESSION: Suboptimal inspiration. Stable mild cardiomegaly. No acute cardiopulmonary disease. Electronically Signed   By: Evangeline Dakin M.D.   On: 11/05/2020 16:59   CT Head Wo Contrast  Result Date: 11/05/2020 CLINICAL DATA:  Fall. EXAM: CT HEAD WITHOUT CONTRAST CT MAXILLOFACIAL WITHOUT CONTRAST CT CERVICAL SPINE WITHOUT CONTRAST TECHNIQUE: Multidetector CT imaging of the head, cervical spine, and maxillofacial structures were performed using the standard protocol without intravenous contrast. Multiplanar CT image reconstructions of the cervical spine and maxillofacial structures were also generated. COMPARISON:  CT head dated May 11, 2019. MRI head and cervical spine dated May 05, 2019. CT maxillofacial dated April 12, 2019. FINDINGS: CT HEAD FINDINGS Brain: No evidence of acute infarction, hemorrhage, hydrocephalus, extra-axial collection or mass lesion/mass effect. Stable mild atrophy. Vascular: Atherosclerotic vascular calcification of the carotid siphons. No hyperdense vessel. Skull: Normal. Negative for fracture or focal lesion. Other: None. CT MAXILLOFACIAL FINDINGS Osseous: No fracture or mandibular dislocation. No destructive process. Orbits: Negative. No traumatic or inflammatory finding. Sinuses: Chronic right maxillary sinusitis and mucoperiosteal  thickening. New small air-fluid levels in the left maxillary and right sphenoid sinuses. The mastoid air cells are clear. Soft tissues: Small nasal soft tissue laceration. CT CERVICAL SPINE FINDINGS Alignment: No traumatic malalignment. Unchanged focal reversal of the normal cervical lordosis  at C5-C6. Skull base and vertebrae: No acute fracture. No primary bone lesion or focal pathologic process. C5-C6 ankylosis. Soft tissues and spinal canal: No prevertebral fluid or swelling. No visible canal hematoma. Disc levels:  Progressive now severe disc height loss at C6-C7. Upper chest: Negative. Other: None. IMPRESSION: CT head: 1. No acute intracranial abnormality. CT maxillofacial: 1. No acute maxillofacial fracture. Small nasal soft tissue laceration. 2. Chronic right maxillary sinusitis. New small air-fluid levels in the left maxillary and right sphenoid sinuses. Correlate for acute sinusitis. CT cervical spine: 1. No acute cervical spine fracture or traumatic listhesis. 2. Progressive now severe degenerative disc disease at C6-C7. Electronically Signed   By: Titus Dubin M.D.   On: 11/05/2020 17:41   CT Chest Wo Contrast  Result Date: 11/07/2020 CLINICAL DATA:  Rib fracture suspected EXAM: CT CHEST WITHOUT CONTRAST TECHNIQUE: Multidetector CT imaging of the chest was performed following the standard protocol without IV contrast. COMPARISON:  None. FINDINGS: Cardiovascular: Aortic atherosclerosis. Cardiomegaly. Three-vessel coronary artery calcifications. No pericardial effusion. Enlargement of the main pulmonary artery measuring 3.8 cm in caliber. Mediastinum/Nodes: No enlarged mediastinal, hilar, or axillary lymph nodes. Thyroid gland, trachea, and esophagus demonstrate no significant findings. Lungs/Pleura: Lungs are clear. No pleural effusion or pneumothorax. Upper Abdomen: No acute abnormality. Musculoskeletal: No chest wall mass or suspicious bone lesions identified. IMPRESSION: 1. No displaced rib  fracture or other noncontrast CT evidence of acute traumatic injury to the chest. 2. Cardiomegaly and coronary artery disease. 3. Enlargement of the main pulmonary artery, as can be seen in pulmonary hypertension. Aortic Atherosclerosis (ICD10-I70.0). Electronically Signed   By: Eddie Candle M.D.   On: 11/07/2020 15:52   CT Cervical Spine Wo Contrast  Result Date: 11/05/2020 CLINICAL DATA:  Fall. EXAM: CT HEAD WITHOUT CONTRAST CT MAXILLOFACIAL WITHOUT CONTRAST CT CERVICAL SPINE WITHOUT CONTRAST TECHNIQUE: Multidetector CT imaging of the head, cervical spine, and maxillofacial structures were performed using the standard protocol without intravenous contrast. Multiplanar CT image reconstructions of the cervical spine and maxillofacial structures were also generated. COMPARISON:  CT head dated May 11, 2019. MRI head and cervical spine dated May 05, 2019. CT maxillofacial dated April 12, 2019. FINDINGS: CT HEAD FINDINGS Brain: No evidence of acute infarction, hemorrhage, hydrocephalus, extra-axial collection or mass lesion/mass effect. Stable mild atrophy. Vascular: Atherosclerotic vascular calcification of the carotid siphons. No hyperdense vessel. Skull: Normal. Negative for fracture or focal lesion. Other: None. CT MAXILLOFACIAL FINDINGS Osseous: No fracture or mandibular dislocation. No destructive process. Orbits: Negative. No traumatic or inflammatory finding. Sinuses: Chronic right maxillary sinusitis and mucoperiosteal thickening. New small air-fluid levels in the left maxillary and right sphenoid sinuses. The mastoid air cells are clear. Soft tissues: Small nasal soft tissue laceration. CT CERVICAL SPINE FINDINGS Alignment: No traumatic malalignment. Unchanged focal reversal of the normal cervical lordosis at C5-C6. Skull base and vertebrae: No acute fracture. No primary bone lesion or focal pathologic process. C5-C6 ankylosis. Soft tissues and spinal canal: No prevertebral fluid or swelling. No  visible canal hematoma. Disc levels:  Progressive now severe disc height loss at C6-C7. Upper chest: Negative. Other: None. IMPRESSION: CT head: 1. No acute intracranial abnormality. CT maxillofacial: 1. No acute maxillofacial fracture. Small nasal soft tissue laceration. 2. Chronic right maxillary sinusitis. New small air-fluid levels in the left maxillary and right sphenoid sinuses. Correlate for acute sinusitis. CT cervical spine: 1. No acute cervical spine fracture or traumatic listhesis. 2. Progressive now severe degenerative disc disease at C6-C7. Electronically Signed   By:  Titus Dubin M.D.   On: 11/05/2020 17:41   ECHOCARDIOGRAM COMPLETE  Result Date: 11/06/2020    ECHOCARDIOGRAM REPORT   Patient Name:   MAUDY YONAN [facility] Date of Exam: 11/06/2020 Medical Rec #:  [id number]            Height:       66.0 in Accession #:    [id number]           Weight:       173.1 lb Date of Birth:  [birth year]            BSA:          1.881 m Patient Age:    46 years             BP:           138/56 mmHg Patient Gender: F                    HR:           97 bpm. Exam Location:  [facility] Procedure: 2D Echo, Cardiac Doppler and Color Doppler Indications:    Syncope R55  History:        Patient has prior history of Echocardiogram examinations, most                 recent 05/05/2019. CAD and Previous Myocardial Infarction,                 Arrythmias:Atrial Fibrillation; Risk Factors:Hypertension,                 Diabetes and Dyslipidemia.  Sonographer:    Alvino Chapel RCS Referring Phys: [id number] [place]  1. Left ventricular ejection fraction, by estimation, is 55 to 60%. The left ventricle has normal function. The left ventricle demonstrates regional wall motion abnormalities (see scoring diagram/findings for description). There is moderate asymmetric left ventricular hypertrophy of the basal and septal segments. Left ventricular diastolic parameters are indeterminate.  2. Right ventricular  systolic function is normal. The right ventricular size is normal. There is normal pulmonary artery systolic pressure. The estimated right ventricular systolic pressure is 89.3 mmHg.  3. Left atrial size was mild to moderately dilated.  4. Possible small to moderate pericardial fluid collection at posterior base of LV. No obvious hemodynamic significance.  5. The mitral valve is abnormal, mildly calcified with restricted posterior leaflet motion. Mild mitral valve regurgitation.  6. The aortic valve is tricuspid. Aortic valve regurgitation is not visualized. Mild to moderate aortic valve sclerosis/calcification is present, without any evidence of aortic stenosis.  7. The inferior vena cava is normal in size with greater than 50% respiratory variability, suggesting right atrial pressure of 3 mmHg. FINDINGS  Left Ventricle: Left ventricular ejection fraction, by estimation, is 55 to 60%. The left ventricle has normal function. The left ventricle demonstrates regional wall motion abnormalities. The left ventricular internal cavity size was normal in size. There is moderate asymmetric left ventricular hypertrophy of the basal and septal segments. Left ventricular diastolic parameters are indeterminate.  LV Wall Scoring: The basal inferior segment is akinetic. The entire anterior wall, entire lateral wall, entire septum, entire apex, and mid and distal inferior wall are normal. Right Ventricle: The right ventricular size is normal. No increase in right ventricular wall thickness. Right ventricular systolic function is normal. There is normal pulmonary artery systolic pressure. The tricuspid regurgitant velocity is 2.45 m/s, and  with an assumed right atrial pressure of  3 mmHg, the estimated right ventricular systolic pressure is 82.5 mmHg. Left Atrium: Left atrial size was mild to moderately dilated. Right Atrium: Right atrial size was normal in size. Pericardium: Possible small to moderate pericardial fluid collection  at posterior base of LV. No obvious hemodynamic significance. The pericardium was not well visualized. Mitral Valve: The mitral valve is abnormal. There is mild calcification of the mitral valve leaflet(s). Mild mitral valve regurgitation. Tricuspid Valve: The tricuspid valve is grossly normal. Tricuspid valve regurgitation is mild. Aortic Valve: The aortic valve is tricuspid. There is mild aortic valve annular calcification. Aortic valve regurgitation is not visualized. Mild to moderate aortic valve sclerosis/calcification is present, without any evidence of aortic stenosis. Pulmonic Valve: The pulmonic valve was grossly normal. Pulmonic valve regurgitation is trivial. Aorta: The aortic root is normal in size and structure. Venous: The inferior vena cava is normal in size with greater than 50% respiratory variability, suggesting right atrial pressure of 3 mmHg. IAS/Shunts: No atrial level shunt detected by color flow Doppler.  LEFT VENTRICLE PLAX 2D LVIDd:         4.40 cm  Diastology LVIDs:         3.40 cm  LV e' medial:    6.20 cm/s LV PW:         1.20 cm  LV E/e' medial:  15.7 LV IVS:        1.80 cm  LV e' lateral:   9.79 cm/s LVOT diam:     1.90 cm  LV E/e' lateral: 9.9 LV SV:         50 LV SV Index:   27 LVOT Area:     2.84 cm  RIGHT VENTRICLE RV S prime:     8.22 cm/s TAPSE (M-mode): 1.3 cm LEFT ATRIUM             Index       RIGHT ATRIUM           Index LA diam:        4.10 cm 2.18 cm/m  RA Area:     15.00 cm LA Vol (A2C):   80.5 ml 42.81 ml/m RA Volume:   36.60 ml  19.46 ml/m LA Vol (A4C):   75.3 ml 40.04 ml/m LA Biplane Vol: 77.8 ml 41.37 ml/m  AORTIC VALVE LVOT Vmax:   85.60 cm/s LVOT Vmean:  62.500 cm/s LVOT VTI:    0.178 m  AORTA Ao Root diam: 3.40 cm MITRAL VALVE               TRICUSPID VALVE MV Area (PHT): 3.60 cm    TR Peak grad:   24.0 mmHg MV Decel Time: 211 msec    TR Vmax:        245.00 cm/s MV E velocity: 97.30 cm/s                            SHUNTS                            Systemic  VTI:  0.18 m                            Systemic Diam: 1.90 cm Rozann Lesches MD Electronically signed by Rozann Lesches MD Signature Date/Time: 11/06/2020/12:38:48 PM    Final    CT Maxillofacial Wo Contrast  Result Date: 11/05/2020  CLINICAL DATA:  Fall. EXAM: CT HEAD WITHOUT CONTRAST CT MAXILLOFACIAL WITHOUT CONTRAST CT CERVICAL SPINE WITHOUT CONTRAST TECHNIQUE: Multidetector CT imaging of the head, cervical spine, and maxillofacial structures were performed using the standard protocol without intravenous contrast. Multiplanar CT image reconstructions of the cervical spine and maxillofacial structures were also generated. COMPARISON:  CT head dated May 11, 2019. MRI head and cervical spine dated May 05, 2019. CT maxillofacial dated April 12, 2019. FINDINGS: CT HEAD FINDINGS Brain: No evidence of acute infarction, hemorrhage, hydrocephalus, extra-axial collection or mass lesion/mass effect. Stable mild atrophy. Vascular: Atherosclerotic vascular calcification of the carotid siphons. No hyperdense vessel. Skull: Normal. Negative for fracture or focal lesion. Other: None. CT MAXILLOFACIAL FINDINGS Osseous: No fracture or mandibular dislocation. No destructive process. Orbits: Negative. No traumatic or inflammatory finding. Sinuses: Chronic right maxillary sinusitis and mucoperiosteal thickening. New small air-fluid levels in the left maxillary and right sphenoid sinuses. The mastoid air cells are clear. Soft tissues: Small nasal soft tissue laceration. CT CERVICAL SPINE FINDINGS Alignment: No traumatic malalignment. Unchanged focal reversal of the normal cervical lordosis at C5-C6. Skull base and vertebrae: No acute fracture. No primary bone lesion or focal pathologic process. C5-C6 ankylosis. Soft tissues and spinal canal: No prevertebral fluid or swelling. No visible canal hematoma. Disc levels:  Progressive now severe disc height loss at C6-C7. Upper chest: Negative. Other: None. IMPRESSION: CT  head: 1. No acute intracranial abnormality. CT maxillofacial: 1. No acute maxillofacial fracture. Small nasal soft tissue laceration. 2. Chronic right maxillary sinusitis. New small air-fluid levels in the left maxillary and right sphenoid sinuses. Correlate for acute sinusitis. CT cervical spine: 1. No acute cervical spine fracture or traumatic listhesis. 2. Progressive now severe degenerative disc disease at C6-C7. Electronically Signed   By: Titus Dubin M.D.   On: 11/05/2020 17:41    Telemetry: Independently reviewed.  Atrial fibrillation at 113 bpm  Assessment/Plan Acute blood loss anemia secondary to hematemesis with nausea: Patient presents with complaints of nausea and vomiting.  Emesis reported to possibly be coffee-ground in appearance.  She was found to have an acute drop in hemoglobin down from 13.8 just to days ago down to 9.9 with elevated BUN of 51 consistent with an upper GI bleed.   -Admit to a medical telemetry bed -Type and screen for the possible need of blood product -Check stool -Clear liquid diet -Hold Eliquis and give Kcentra -Serial monitoring of H&H -Protonix drip per protocol -Normal saline IV fluids -Transfuse blood products as needed for hemoglobins less than 8 g/dL -Dr. Benson Norway gastroenterology consulted, we will follow-up for any further recommendations  Atypical chest pain elevated troponin: Patient reports complaints of chest pain and has tenderness palpation on exam.  High-sensitivity troponin 41->35.  CT scan of the chest did not show any signs of any acute rib fracture.  Suspect likely secondary to demand.   -No further cardiac work-up warranted at this time  Paroxysmal atrial fibrillation on chronic anticoagulation: Patient currently in atrial fibrillation with heart rates in the low 100s.  She last took her Eliquis this morning. -Orders placed for Kcentra -Continue to monitor heart rates as patient may need to be started on something for rate control, but  metoprolol currently on hold due to soft initial blood pressures.  Transient hypotension: On admission patient's blood pressures were reported to be as low as 90/43, but improved shortly thereafter. -Hold current home blood pressure  Acute kidney injury superimposed on chronic kidney disease stage IIIb: On admission creatinine elevated  up to 1.54 with BUN 51. Previously creatinine had been 1.15 just 2 days ago.  The elevated BUN to creatinine ratio suggests prerenal cause of symptoms. -Continue IV fluids at 100 mL/h -Recheck kidney function in a.m.  Diabetes mellitus type 2, uncontrolled: Last hemoglobin A1c 8.1 on 10/14/2020.  On admission glucose noted to be 161. -Hypoglycemia protocol -CBGs every 6 hours with moderate SSI  Patient currently is amendable to blood products if needed  DVT prophylaxis: SCDs Code Status: Full Family Communication: Attempted to call daughter over the phone no answer Disposition Plan: To be determined Consults called: Gastroenterology Admission status: Inpatient, likely require more than 2 midnight stay due to AKI Norval Morton MD [facility]   If 7PM-7AM, please contact night-coverage   11/07/2020, 4:12 PM

## 2020-11-07 NOTE — ED Notes (Signed)
Report at this time. Patient placed back onto bed, replaced on monitor x3. In NAD at this time. Awaiting IV team consult for access.

## 2020-11-07 NOTE — ED Notes (Addendum)
Patient signed refusal of blood products, hospitalist at bedside and made aware. IV team at bedside for IV access.  After talks with the patient's grandson she retracted her statements and is willing to accept blood products if needed

## 2020-11-07 NOTE — ED Provider Notes (Signed)
Emergency Medicine Provider Triage Evaluation Note  Cassandra Adams , a 75 y.o. female  was evaluated in triage.  Pt complains of chest pain.    Review of Systems  Positive: Chest pain Negative: No cough  Physical Exam  There were no vitals taken for this visit. Gen:   Awake, no distress   Resp:  Normal effort  MSK:   Moves extremities without difficulty  Other:    Medical Decision Making  Medically screening exam initiated at 11:28 AM.  Appropriate orders placed.  SHAZIA MITCHENER was informed that the remainder of the evaluation will be completed by another provider, this initial triage assessment does not replace that evaluation, and the importance of remaining in the ED until their evaluation is complete.     Fransico Meadow, [place] 11/07/20 1129    Tegeler, Gwenyth Allegra, MD 11/07/20 2030

## 2020-11-07 NOTE — H&P (View-Only) (Signed)
Consult Note for [facility]  Reason for Consult: GI bleed Referring Physician: [facility]  [name] HPI: This is a 75 year old female with a PMH of atrial fibrillation on Eliquis, diastolic CHF, DM, HTN, CAD s/p MI 2015, and stage 3 CKD admitted for coffee-ground emesis and anemia.  She was just evaluated at [facility] for a syncopal episode and epistaxis.  She presented at that time after sustaining a fall with resultant facial skin abrasion, but she suffered a significant nasal contusion.  Her daughter found her at home and she reported that she was in a pool of blood and her clothes were drenched with blood.  The bleeding did not stop until she was evaluated in the ER and nasal packing was performed.  Work up at that time did not show that she had any cardiac source for her syncope.  It was concluded that she suffered with a vasovagal episode.  During her stay at [facility] she was in afib with RVR, which was controlled.  She was stable with observation and she was sent home, but she did not feel that she was ready for discharge.  This AM she started to experience chest pain and this was followed by coffee-ground emesis.  With this new development she presented to [facility] for further evaluation.  Her HGB was noted to be at 9.5 g/dL, which is a drop from her 13 g/dL baseline.  She was successfully fluid resuscitated.  The patient denies any history of GERD and she denies using any pain medications, such as NSAIDs.  Past Medical History:  Diagnosis Date  . Arthritis    "all over"  . CAD (coronary artery disease)    a. NSTEMI 12/2013 - occluded dominant RCA with L-R collaterals, moderate prox segmental LAD disease, for medical therapy initially, EF 60% with subtle inferobasal hypokinesia. Consider PCI for refractory CP.  [name] CKD (chronic kidney disease), stage III ([place])   . Hypertension   . Migraines    "weekly" (01/06/2014)  . Sickle cell trait ([place])   . TIA (transient  ischemic attack) 1978  . Type II diabetes mellitus ([place])   . Vertigo     Past Surgical History:  Procedure Laterality Date  . APPENDECTOMY  March 2014   had appendix frozen  . CARDIAC CATHETERIZATION  "years ago" & 01/05/2014  . CARDIOVERSION N/A 03/17/2014   Procedure: CARDIOVERSION;  Surgeon: [name], MD;  Location: [facility] ENDOSCOPY;  Service: Cardiovascular;  Laterality: N/A;  . CATARACT EXTRACTION W/ INTRAOCULAR LENS  IMPLANT, BILATERAL Bilateral 04/2013-05/2013  . ENDARTERECTOMY Right 02/05/2017   Procedure: ENDARTERECTOMY CAROTID-RIGHT;  Surgeon: [name], MD;  Location: [facility] OR;  Service: Vascular;  Laterality: Right;  . ESOPHAGOGASTRODUODENOSCOPY  11/08/2007    Normal esophagus without evidence of Barrett, mass, erosion/ Normal stomach, duodenal bulb  . GASTRIC MOTILITY STUDY  11/13/2007   mildly delayed emptying subjectively, but normal  analysis-77% of tracer emptied at 2 hours  . JOINT REPLACEMENT    . LAPAROSCOPIC APPENDECTOMY N/A 09/15/2012   Procedure: APPENDECTOMY LAPAROSCOPIC;  Surgeon: [name], MD;  Location: [facility] ORS;  Service: General;  Laterality: N/A;  . LEFT HEART CATHETERIZATION WITH CORONARY ANGIOGRAM N/A 01/05/2014   Procedure: LEFT HEART CATHETERIZATION WITH CORONARY ANGIOGRAM;  Surgeon: [name], MD;  Location: [facility] CATH LAB;  Service: Cardiovascular;  Laterality: N/A;  . REPLACEMENT TOTAL KNEE Right 05-03-06  . TEE WITHOUT CARDIOVERSION N/A 03/17/2014   Procedure: TRANSESOPHAGEAL ECHOCARDIOGRAM (TEE);  Surgeon: [name], MD;  Location: [facility] ENDOSCOPY;  Service: Cardiovascular;  Laterality: N/A;  . TONSILLECTOMY  1972    Family History  Problem Relation Age of Onset  . Hyperlipidemia Mother   . Hypertension Mother   . Heart attack Mother   . Cancer Father        lung  . Hypertension Sister   . Diabetes Brother   . Heart disease Brother   . Hyperlipidemia Brother   . Hypertension Brother   . Heart attack Brother   . Other  Brother        DVT    Social History:  reports that she has never smoked. She has never used smokeless tobacco. She reports that she does not drink alcohol and does not use drugs.  Allergies:  Allergies  Allergen Reactions  . Penicillins Hives    Has patient had a PCN reaction causing immediate rash, facial/tongue/throat swelling, SOB or lightheadedness with hypotension: no Has patient had a PCN reaction causing severe rash involving mucus membranes or skin necrosis: No  Has patient had a PCN reaction that required hospitalization: no Has patient had a PCN reaction occurring within the last 10 years: no If all of the above answers are "NO", then may proceed with Cephalosporin use.     Medications:  Scheduled: . Chlorhexidine Gluconate Cloth  6 each Topical Daily  . insulin aspart  0-15 Units Subcutaneous Q6H  . [START ON 11/11/2020] pantoprazole  40 mg Intravenous Q12H  . sodium chloride flush  10-40 mL Intracatheter Q12H   Continuous: . sodium chloride 75 mL/hr at 11/07/20 1808  . pantoprozole (PROTONIX) infusion 8 mg/hr (11/07/20 1847)  . prothrombin complex conc human (Kcentra) IVPB      Results for orders placed or performed during the hospital encounter of 11/07/20 (from the past 24 hour(s))  Basic metabolic panel     Status: Abnormal   Collection Time: 11/07/20 11:32 AM  Result Value Ref Range   Sodium 139 135 - 145 mmol/L   Potassium 4.4 3.5 - 5.1 mmol/L   Chloride 109 98 - 111 mmol/L   CO2 23 22 - 32 mmol/L   Glucose, Bld 161 (H) 70 - 99 mg/dL   BUN 51 (H) 8 - 23 mg/dL   Creatinine, Ser 1.64 (H) 0.44 - 1.00 mg/dL   Calcium 9.9 8.9 - 10.3 mg/dL   GFR, Estimated 32 (L) >60 mL/min   Anion gap 7 5 - 15  CBC     Status: Abnormal   Collection Time: 11/07/20 11:32 AM  Result Value Ref Range   WBC 4.7 4.0 - 10.5 K/uL   RBC 3.48 (L) 3.87 - 5.11 MIL/uL   Hemoglobin 9.9 (L) 12.0 - 15.0 g/dL   HCT 28.6 (L) 36.0 - 46.0 %   MCV 82.2 80.0 - 100.0 fL   MCH 28.4 26.0 - 34.0  pg   MCHC 34.6 30.0 - 36.0 g/dL   RDW 14.6 11.5 - 15.5 %   Platelets 178 150 - 400 K/uL   nRBC 0.0 0.0 - 0.2 %  Troponin I (High Sensitivity)     Status: Abnormal   Collection Time: 11/07/20 11:32 AM  Result Value Ref Range   Troponin I (High Sensitivity) 41 (H) <18 ng/L  Troponin I (High Sensitivity)     Status: Abnormal   Collection Time: 11/07/20  2:29 PM  Result Value Ref Range   Troponin I (High Sensitivity) 34 (H) <18 ng/L  Hemoglobin and hematocrit, blood  Status: Abnormal   Collection Time: 11/07/20  5:27 PM  Result Value Ref Range   Hemoglobin 9.5 (L) 12.0 - 15.0 g/dL   HCT 27.4 (L) 36.0 - 46.0 %  CK     Status: None   Collection Time: 11/07/20  5:27 PM  Result Value Ref Range   Total CK 117 38 - 234 U/L  CBG monitoring, ED     Status: Abnormal   Collection Time: 11/07/20  6:13 PM  Result Value Ref Range   Glucose-Capillary 194 (H) 70 - 99 mg/dL     DG Chest 2 View  Result Date: 11/07/2020 CLINICAL DATA:  Chest pain and emesis. EXAM: CHEST - 2 VIEW COMPARISON:  11/05/2020 FINDINGS: Normal heart size. Aortic atherosclerosis. Asymmetric elevation of right hemidiaphragm is unchanged. The visualized osseous structures are unremarkable. IMPRESSION: No active cardiopulmonary abnormalities. Electronically Signed   By: Kerby Moors M.D.   On: 11/07/2020 12:28   CT Chest Wo Contrast  Result Date: 11/07/2020 CLINICAL DATA:  Rib fracture suspected EXAM: CT CHEST WITHOUT CONTRAST TECHNIQUE: Multidetector CT imaging of the chest was performed following the standard protocol without IV contrast. COMPARISON:  None. FINDINGS: Cardiovascular: Aortic atherosclerosis. Cardiomegaly. Three-vessel coronary artery calcifications. No pericardial effusion. Enlargement of the main pulmonary artery measuring 3.8 cm in caliber. Mediastinum/Nodes: No enlarged mediastinal, hilar, or axillary lymph nodes. Thyroid gland, trachea, and esophagus demonstrate no significant findings. Lungs/Pleura: Lungs  are clear. No pleural effusion or pneumothorax. Upper Abdomen: No acute abnormality. Musculoskeletal: No chest wall mass or suspicious bone lesions identified. IMPRESSION: 1. No displaced rib fracture or other noncontrast CT evidence of acute traumatic injury to the chest. 2. Cardiomegaly and coronary artery disease. 3. Enlargement of the main pulmonary artery, as can be seen in pulmonary hypertension. Aortic Atherosclerosis (ICD10-I70.0). Electronically Signed   By: Eddie Candle M.D.   On: 11/07/2020 15:52   ECHOCARDIOGRAM COMPLETE  Result Date: 11/06/2020    ECHOCARDIOGRAM REPORT   Patient Name:   Cassandra Adams [facility] Date of Exam: 11/06/2020 Medical Rec #:  [id number]            Height:       66.0 in Accession #:    [id number]           Weight:       173.1 lb Date of Birth:  [birth year]            BSA:          1.881 m Patient Age:    62 years             BP:           138/56 mmHg Patient Gender: F                    HR:           97 bpm. Exam Location:  [facility] Procedure: 2D Echo, Cardiac Doppler and Color Doppler Indications:    Syncope R55  History:        Patient has prior history of Echocardiogram examinations, most                 recent 05/05/2019. CAD and Previous Myocardial Infarction,                 Arrythmias:Atrial Fibrillation; Risk Factors:Hypertension,                 Diabetes and Dyslipidemia.  Sonographer:    Alvino Chapel RCS Referring Phys:  [id number] [place]  1. Left ventricular ejection fraction, by estimation, is 55 to 60%. The left ventricle has normal function. The left ventricle demonstrates regional wall motion abnormalities (see scoring diagram/findings for description). There is moderate asymmetric left ventricular hypertrophy of the basal and septal segments. Left ventricular diastolic parameters are indeterminate.  2. Right ventricular systolic function is normal. The right ventricular size is normal. There is normal pulmonary artery systolic pressure. The  estimated right ventricular systolic pressure is 99.3 mmHg.  3. Left atrial size was mild to moderately dilated.  4. Possible small to moderate pericardial fluid collection at posterior base of LV. No obvious hemodynamic significance.  5. The mitral valve is abnormal, mildly calcified with restricted posterior leaflet motion. Mild mitral valve regurgitation.  6. The aortic valve is tricuspid. Aortic valve regurgitation is not visualized. Mild to moderate aortic valve sclerosis/calcification is present, without any evidence of aortic stenosis.  7. The inferior vena cava is normal in size with greater than 50% respiratory variability, suggesting right atrial pressure of 3 mmHg. FINDINGS  Left Ventricle: Left ventricular ejection fraction, by estimation, is 55 to 60%. The left ventricle has normal function. The left ventricle demonstrates regional wall motion abnormalities. The left ventricular internal cavity size was normal in size. There is moderate asymmetric left ventricular hypertrophy of the basal and septal segments. Left ventricular diastolic parameters are indeterminate.  LV Wall Scoring: The basal inferior segment is akinetic. The entire anterior wall, entire lateral wall, entire septum, entire apex, and mid and distal inferior wall are normal. Right Ventricle: The right ventricular size is normal. No increase in right ventricular wall thickness. Right ventricular systolic function is normal. There is normal pulmonary artery systolic pressure. The tricuspid regurgitant velocity is 2.45 m/s, and  with an assumed right atrial pressure of 3 mmHg, the estimated right ventricular systolic pressure is 71.6 mmHg. Left Atrium: Left atrial size was mild to moderately dilated. Right Atrium: Right atrial size was normal in size. Pericardium: Possible small to moderate pericardial fluid collection at posterior base of LV. No obvious hemodynamic significance. The pericardium was not well visualized. Mitral Valve: The  mitral valve is abnormal. There is mild calcification of the mitral valve leaflet(s). Mild mitral valve regurgitation. Tricuspid Valve: The tricuspid valve is grossly normal. Tricuspid valve regurgitation is mild. Aortic Valve: The aortic valve is tricuspid. There is mild aortic valve annular calcification. Aortic valve regurgitation is not visualized. Mild to moderate aortic valve sclerosis/calcification is present, without any evidence of aortic stenosis. Pulmonic Valve: The pulmonic valve was grossly normal. Pulmonic valve regurgitation is trivial. Aorta: The aortic root is normal in size and structure. Venous: The inferior vena cava is normal in size with greater than 50% respiratory variability, suggesting right atrial pressure of 3 mmHg. IAS/Shunts: No atrial level shunt detected by color flow Doppler.  LEFT VENTRICLE PLAX 2D LVIDd:         4.40 cm  Diastology LVIDs:         3.40 cm  LV e' medial:    6.20 cm/s LV PW:         1.20 cm  LV E/e' medial:  15.7 LV IVS:        1.80 cm  LV e' lateral:   9.79 cm/s LVOT diam:     1.90 cm  LV E/e' lateral: 9.9 LV SV:         50 LV SV Index:   27 LVOT Area:     2.84  cm  RIGHT VENTRICLE RV S prime:     8.22 cm/s TAPSE (M-mode): 1.3 cm LEFT ATRIUM             Index       RIGHT ATRIUM           Index LA diam:        4.10 cm 2.18 cm/m  RA Area:     15.00 cm LA Vol (A2C):   80.5 ml 42.81 ml/m RA Volume:   36.60 ml  19.46 ml/m LA Vol (A4C):   75.3 ml 40.04 ml/m LA Biplane Vol: 77.8 ml 41.37 ml/m  AORTIC VALVE LVOT Vmax:   85.60 cm/s LVOT Vmean:  62.500 cm/s LVOT VTI:    0.178 m  AORTA Ao Root diam: 3.40 cm MITRAL VALVE               TRICUSPID VALVE MV Area (PHT): 3.60 cm    TR Peak grad:   24.0 mmHg MV Decel Time: 211 msec    TR Vmax:        245.00 cm/s MV E velocity: 97.30 cm/s                            SHUNTS                            Systemic VTI:  0.18 m                            Systemic Diam: 1.90 cm Rozann Lesches MD Electronically signed by Rozann Lesches  MD Signature Date/Time: 11/06/2020/12:38:48 PM    Final     ROS:  As stated above in the HPI otherwise negative.  Blood pressure (!) 117/53, pulse 84, temperature 98.3 F (36.8 C), temperature source Oral, resp. rate 18, SpO2 100 %.    PE: Gen: NAD, Alert and Oriented HEENT:  [place]/AT, EOMI Neck: Supple, no LAD Lungs: CTA Bilaterally CV: Irreg, irreg ABD: Soft, NTND, +BS Ext: No C/C/E  Assessment/Plan: 1) Coffee-ground emesis. 2) Noncardiac chest pain. 3) Anemia.   Speaking with her daughter provided better insight to the volume of blood loss from the epistaxis.  It is likely that all or the majority of her anemia was secondary to the epistaxis.  The coffee-ground emesis may be from some swallowed blood, however, it is prudent to evaluate the patient with an EGD.  There may be a component of an upper GI source for bleeding.  Plan: 1) EGD tomorrow with Dr. Tarri Glenn. 2) Follow HGB and transfuse if necessary.  Fayelynn Distel D 11/07/2020, 6:49 PM

## 2020-11-07 NOTE — ED Triage Notes (Signed)
Pt reports he was seen here Friday and admitted to the hospital. Pt states, "they dc'd me home yesterday, I told him I didn't feel good and I didn't want to go home. They sent me home anyways. I had a terrible headdache". Pt reports she woke this am with chest pain under her Left breast and collar bone.

## 2020-11-07 NOTE — Consult Note (Signed)
Consult Note for [facility]  Reason for Consult: GI bleed Referring Physician: [facility]  [name] HPI: This is a 75 year old female with a PMH of atrial fibrillation on Eliquis, diastolic CHF, DM, HTN, CAD s/p MI 2015, and stage 3 CKD admitted for coffee-ground emesis and anemia.  She was just evaluated at [facility] for a syncopal episode and epistaxis.  She presented at that time after sustaining a fall with resultant facial skin abrasion, but she suffered a significant nasal contusion.  Her daughter found her at home and she reported that she was in a pool of blood and her clothes were drenched with blood.  The bleeding did not stop until she was evaluated in the ER and nasal packing was performed.  Work up at that time did not show that she had any cardiac source for her syncope.  It was concluded that she suffered with a vasovagal episode.  During her stay at [facility] she was in afib with RVR, which was controlled.  She was stable with observation and she was sent home, but she did not feel that she was ready for discharge.  This AM she started to experience chest pain and this was followed by coffee-ground emesis.  With this new development she presented to [facility] for further evaluation.  Her HGB was noted to be at 9.5 g/dL, which is a drop from her 13 g/dL baseline.  She was successfully fluid resuscitated.  The patient denies any history of GERD and she denies using any pain medications, such as NSAIDs.  Past Medical History:  Diagnosis Date  . Arthritis    "all over"  . CAD (coronary artery disease)    a. NSTEMI 12/2013 - occluded dominant RCA with L-R collaterals, moderate prox segmental LAD disease, for medical therapy initially, EF 60% with subtle inferobasal hypokinesia. Consider PCI for refractory CP.  [name] CKD (chronic kidney disease), stage III ([place])   . Hypertension   . Migraines    "weekly" (01/06/2014)  . Sickle cell trait ([place])   . TIA (transient  ischemic attack) 1978  . Type II diabetes mellitus ([place])   . Vertigo     Past Surgical History:  Procedure Laterality Date  . APPENDECTOMY  March 2014   had appendix frozen  . CARDIAC CATHETERIZATION  "years ago" & 01/05/2014  . CARDIOVERSION N/A 03/17/2014   Procedure: CARDIOVERSION;  Surgeon: [name], MD;  Location: [facility] ENDOSCOPY;  Service: Cardiovascular;  Laterality: N/A;  . CATARACT EXTRACTION W/ INTRAOCULAR LENS  IMPLANT, BILATERAL Bilateral 04/2013-05/2013  . ENDARTERECTOMY Right 02/05/2017   Procedure: ENDARTERECTOMY CAROTID-RIGHT;  Surgeon: [name], MD;  Location: [facility] OR;  Service: Vascular;  Laterality: Right;  . ESOPHAGOGASTRODUODENOSCOPY  11/08/2007    Normal esophagus without evidence of Barrett, mass, erosion/ Normal stomach, duodenal bulb  . GASTRIC MOTILITY STUDY  11/13/2007   mildly delayed emptying subjectively, but normal  analysis-77% of tracer emptied at 2 hours  . JOINT REPLACEMENT    . LAPAROSCOPIC APPENDECTOMY N/A 09/15/2012   Procedure: APPENDECTOMY LAPAROSCOPIC;  Surgeon: [name], MD;  Location: [facility] ORS;  Service: General;  Laterality: N/A;  . LEFT HEART CATHETERIZATION WITH CORONARY ANGIOGRAM N/A 01/05/2014   Procedure: LEFT HEART CATHETERIZATION WITH CORONARY ANGIOGRAM;  Surgeon: [name], MD;  Location: [facility] CATH LAB;  Service: Cardiovascular;  Laterality: N/A;  . REPLACEMENT TOTAL KNEE Right 05-03-06  . TEE WITHOUT CARDIOVERSION N/A 03/17/2014   Procedure: TRANSESOPHAGEAL ECHOCARDIOGRAM (TEE);  Surgeon: [name], MD;  Location: [facility] ENDOSCOPY;  Service: Cardiovascular;  Laterality: N/A;  . TONSILLECTOMY  1972    Family History  Problem Relation Age of Onset  . Hyperlipidemia Mother   . Hypertension Mother   . Heart attack Mother   . Cancer Father        lung  . Hypertension Sister   . Diabetes Brother   . Heart disease Brother   . Hyperlipidemia Brother   . Hypertension Brother   . Heart attack Brother   . Other  Brother        DVT    Social History:  reports that she has never smoked. She has never used smokeless tobacco. She reports that she does not drink alcohol and does not use drugs.  Allergies:  Allergies  Allergen Reactions  . Penicillins Hives    Has patient had a PCN reaction causing immediate rash, facial/tongue/throat swelling, SOB or lightheadedness with hypotension: no Has patient had a PCN reaction causing severe rash involving mucus membranes or skin necrosis: No  Has patient had a PCN reaction that required hospitalization: no Has patient had a PCN reaction occurring within the last 10 years: no If all of the above answers are "NO", then may proceed with Cephalosporin use.     Medications:  Scheduled: . Chlorhexidine Gluconate Cloth  6 each Topical Daily  . insulin aspart  0-15 Units Subcutaneous Q6H  . [START ON 11/11/2020] pantoprazole  40 mg Intravenous Q12H  . sodium chloride flush  10-40 mL Intracatheter Q12H   Continuous: . sodium chloride 75 mL/hr at 11/07/20 1808  . pantoprozole (PROTONIX) infusion 8 mg/hr (11/07/20 1847)  . prothrombin complex conc human (Kcentra) IVPB      Results for orders placed or performed during the hospital encounter of 11/07/20 (from the past 24 hour(s))  Basic metabolic panel     Status: Abnormal   Collection Time: 11/07/20 11:32 AM  Result Value Ref Range   Sodium 139 135 - 145 mmol/L   Potassium 4.4 3.5 - 5.1 mmol/L   Chloride 109 98 - 111 mmol/L   CO2 23 22 - 32 mmol/L   Glucose, Bld 161 (H) 70 - 99 mg/dL   BUN 51 (H) 8 - 23 mg/dL   Creatinine, Ser 1.64 (H) 0.44 - 1.00 mg/dL   Calcium 9.9 8.9 - 10.3 mg/dL   GFR, Estimated 32 (L) >60 mL/min   Anion gap 7 5 - 15  CBC     Status: Abnormal   Collection Time: 11/07/20 11:32 AM  Result Value Ref Range   WBC 4.7 4.0 - 10.5 K/uL   RBC 3.48 (L) 3.87 - 5.11 MIL/uL   Hemoglobin 9.9 (L) 12.0 - 15.0 g/dL   HCT 28.6 (L) 36.0 - 46.0 %   MCV 82.2 80.0 - 100.0 fL   MCH 28.4 26.0 - 34.0  pg   MCHC 34.6 30.0 - 36.0 g/dL   RDW 14.6 11.5 - 15.5 %   Platelets 178 150 - 400 K/uL   nRBC 0.0 0.0 - 0.2 %  Troponin I (High Sensitivity)     Status: Abnormal   Collection Time: 11/07/20 11:32 AM  Result Value Ref Range   Troponin I (High Sensitivity) 41 (H) <18 ng/L  Troponin I (High Sensitivity)     Status: Abnormal   Collection Time: 11/07/20  2:29 PM  Result Value Ref Range   Troponin I (High Sensitivity) 34 (H) <18 ng/L  Hemoglobin and hematocrit, blood  Status: Abnormal   Collection Time: 11/07/20  5:27 PM  Result Value Ref Range   Hemoglobin 9.5 (L) 12.0 - 15.0 g/dL   HCT 27.4 (L) 36.0 - 46.0 %  CK     Status: None   Collection Time: 11/07/20  5:27 PM  Result Value Ref Range   Total CK 117 38 - 234 U/L  CBG monitoring, ED     Status: Abnormal   Collection Time: 11/07/20  6:13 PM  Result Value Ref Range   Glucose-Capillary 194 (H) 70 - 99 mg/dL     DG Chest 2 View  Result Date: 11/07/2020 CLINICAL DATA:  Chest pain and emesis. EXAM: CHEST - 2 VIEW COMPARISON:  11/05/2020 FINDINGS: Normal heart size. Aortic atherosclerosis. Asymmetric elevation of right hemidiaphragm is unchanged. The visualized osseous structures are unremarkable. IMPRESSION: No active cardiopulmonary abnormalities. Electronically Signed   By: Kerby Moors M.D.   On: 11/07/2020 12:28   CT Chest Wo Contrast  Result Date: 11/07/2020 CLINICAL DATA:  Rib fracture suspected EXAM: CT CHEST WITHOUT CONTRAST TECHNIQUE: Multidetector CT imaging of the chest was performed following the standard protocol without IV contrast. COMPARISON:  None. FINDINGS: Cardiovascular: Aortic atherosclerosis. Cardiomegaly. Three-vessel coronary artery calcifications. No pericardial effusion. Enlargement of the main pulmonary artery measuring 3.8 cm in caliber. Mediastinum/Nodes: No enlarged mediastinal, hilar, or axillary lymph nodes. Thyroid gland, trachea, and esophagus demonstrate no significant findings. Lungs/Pleura: Lungs  are clear. No pleural effusion or pneumothorax. Upper Abdomen: No acute abnormality. Musculoskeletal: No chest wall mass or suspicious bone lesions identified. IMPRESSION: 1. No displaced rib fracture or other noncontrast CT evidence of acute traumatic injury to the chest. 2. Cardiomegaly and coronary artery disease. 3. Enlargement of the main pulmonary artery, as can be seen in pulmonary hypertension. Aortic Atherosclerosis (ICD10-I70.0). Electronically Signed   By: Eddie Candle M.D.   On: 11/07/2020 15:52   ECHOCARDIOGRAM COMPLETE  Result Date: 11/06/2020    ECHOCARDIOGRAM REPORT   Patient Name:   SUSA BONES [facility] Date of Exam: 11/06/2020 Medical Rec #:  [id number]            Height:       66.0 in Accession #:    [id number]           Weight:       173.1 lb Date of Birth:  Feb 13, 1946            BSA:          1.881 m Patient Age:    71 years             BP:           138/56 mmHg Patient Gender: F                    HR:           97 bpm. Exam Location:  [facility] Procedure: 2D Echo, Cardiac Doppler and Color Doppler Indications:    Syncope R55  History:        Patient has prior history of Echocardiogram examinations, most                 recent 05/05/2019. CAD and Previous Myocardial Infarction,                 Arrythmias:Atrial Fibrillation; Risk Factors:Hypertension,                 Diabetes and Dyslipidemia.  Sonographer:    Alvino Chapel RCS Referring Phys:  [id number] [place]  1. Left ventricular ejection fraction, by estimation, is 55 to 60%. The left ventricle has normal function. The left ventricle demonstrates regional wall motion abnormalities (see scoring diagram/findings for description). There is moderate asymmetric left ventricular hypertrophy of the basal and septal segments. Left ventricular diastolic parameters are indeterminate.  2. Right ventricular systolic function is normal. The right ventricular size is normal. There is normal pulmonary artery systolic pressure. The  estimated right ventricular systolic pressure is 76.2 mmHg.  3. Left atrial size was mild to moderately dilated.  4. Possible small to moderate pericardial fluid collection at posterior base of LV. No obvious hemodynamic significance.  5. The mitral valve is abnormal, mildly calcified with restricted posterior leaflet motion. Mild mitral valve regurgitation.  6. The aortic valve is tricuspid. Aortic valve regurgitation is not visualized. Mild to moderate aortic valve sclerosis/calcification is present, without any evidence of aortic stenosis.  7. The inferior vena cava is normal in size with greater than 50% respiratory variability, suggesting right atrial pressure of 3 mmHg. FINDINGS  Left Ventricle: Left ventricular ejection fraction, by estimation, is 55 to 60%. The left ventricle has normal function. The left ventricle demonstrates regional wall motion abnormalities. The left ventricular internal cavity size was normal in size. There is moderate asymmetric left ventricular hypertrophy of the basal and septal segments. Left ventricular diastolic parameters are indeterminate.  LV Wall Scoring: The basal inferior segment is akinetic. The entire anterior wall, entire lateral wall, entire septum, entire apex, and mid and distal inferior wall are normal. Right Ventricle: The right ventricular size is normal. No increase in right ventricular wall thickness. Right ventricular systolic function is normal. There is normal pulmonary artery systolic pressure. The tricuspid regurgitant velocity is 2.45 m/s, and  with an assumed right atrial pressure of 3 mmHg, the estimated right ventricular systolic pressure is 83.1 mmHg. Left Atrium: Left atrial size was mild to moderately dilated. Right Atrium: Right atrial size was normal in size. Pericardium: Possible small to moderate pericardial fluid collection at posterior base of LV. No obvious hemodynamic significance. The pericardium was not well visualized. Mitral Valve: The  mitral valve is abnormal. There is mild calcification of the mitral valve leaflet(s). Mild mitral valve regurgitation. Tricuspid Valve: The tricuspid valve is grossly normal. Tricuspid valve regurgitation is mild. Aortic Valve: The aortic valve is tricuspid. There is mild aortic valve annular calcification. Aortic valve regurgitation is not visualized. Mild to moderate aortic valve sclerosis/calcification is present, without any evidence of aortic stenosis. Pulmonic Valve: The pulmonic valve was grossly normal. Pulmonic valve regurgitation is trivial. Aorta: The aortic root is normal in size and structure. Venous: The inferior vena cava is normal in size with greater than 50% respiratory variability, suggesting right atrial pressure of 3 mmHg. IAS/Shunts: No atrial level shunt detected by color flow Doppler.  LEFT VENTRICLE PLAX 2D LVIDd:         4.40 cm  Diastology LVIDs:         3.40 cm  LV e' medial:    6.20 cm/s LV PW:         1.20 cm  LV E/e' medial:  15.7 LV IVS:        1.80 cm  LV e' lateral:   9.79 cm/s LVOT diam:     1.90 cm  LV E/e' lateral: 9.9 LV SV:         50 LV SV Index:   27 LVOT Area:     2.84  cm  RIGHT VENTRICLE RV S prime:     8.22 cm/s TAPSE (M-mode): 1.3 cm LEFT ATRIUM             Index       RIGHT ATRIUM           Index LA diam:        4.10 cm 2.18 cm/m  RA Area:     15.00 cm LA Vol (A2C):   80.5 ml 42.81 ml/m RA Volume:   36.60 ml  19.46 ml/m LA Vol (A4C):   75.3 ml 40.04 ml/m LA Biplane Vol: 77.8 ml 41.37 ml/m  AORTIC VALVE LVOT Vmax:   85.60 cm/s LVOT Vmean:  62.500 cm/s LVOT VTI:    0.178 m  AORTA Ao Root diam: 3.40 cm MITRAL VALVE               TRICUSPID VALVE MV Area (PHT): 3.60 cm    TR Peak grad:   24.0 mmHg MV Decel Time: 211 msec    TR Vmax:        245.00 cm/s MV E velocity: 97.30 cm/s                            SHUNTS                            Systemic VTI:  0.18 m                            Systemic Diam: 1.90 cm Rozann Lesches MD Electronically signed by Rozann Lesches  MD Signature Date/Time: 11/06/2020/12:38:48 PM    Final     ROS:  As stated above in the HPI otherwise negative.  Blood pressure (!) 117/53, pulse 84, temperature 98.3 F (36.8 C), temperature source Oral, resp. rate 18, SpO2 100 %.    PE: Gen: NAD, Alert and Oriented HEENT:  [place]/AT, EOMI Neck: Supple, no LAD Lungs: CTA Bilaterally CV: Irreg, irreg ABD: Soft, NTND, +BS Ext: No C/C/E  Assessment/Plan: 1) Coffee-ground emesis. 2) Noncardiac chest pain. 3) Anemia.   Speaking with her daughter provided better insight to the volume of blood loss from the epistaxis.  It is likely that all or the majority of her anemia was secondary to the epistaxis.  The coffee-ground emesis may be from some swallowed blood, however, it is prudent to evaluate the patient with an EGD.  There may be a component of an upper GI source for bleeding.  Plan: 1) EGD tomorrow with Dr. Tarri Glenn. 2) Follow HGB and transfuse if necessary.  Davidjames Blansett D 11/07/2020, 6:49 PM

## 2020-11-07 NOTE — ED Provider Notes (Signed)
[place] EMERGENCY DEPARTMENT Provider Note   CSN: [id number] Arrival date & time: 11/07/20  1109     History Chief Complaint  Patient presents with  . Chest Pain  . Emesis    Cassandra Adams is a 75 y.o. female.  HPI Patient presents with left-sided chest pain.  Had been admitted to the hospital 2 days ago after syncope and fall.  States she was not having chest pain at that time.  Was admitted to [facility] and then sent home yesterday.  Patient states she was not ready to go but they made her go home anyway.  States she is got a headache and some nausea.  Now woke up this morning with left-sided chest pain.  It is in the left mid chest.  States it goes up and down a little bit.  Worse with movements and certain positions.  Has previous heart attack.  States she does not have pain like this before the fall.  History of atrial fibrillation.  On anticoagulation.  Patient had nosebleed after the fall.  States there was a lot of blood loss.    Past Medical History:  Diagnosis Date  . Arthritis    "all over"  . CAD (coronary artery disease)    a. NSTEMI 12/2013 - occluded dominant RCA with L-R collaterals, moderate prox segmental LAD disease, for medical therapy initially, EF 60% with subtle inferobasal hypokinesia. Consider PCI for refractory CP.  [name] CKD (chronic kidney disease), stage III ([place])   . Hypertension   . Migraines    "weekly" (01/06/2014)  . Sickle cell trait ([place])   . TIA (transient ischemic attack) 1978  . Type II diabetes mellitus ([place])   . Vertigo     Patient Active Problem List   Diagnosis Date Noted  . Syncope and collapse 11/05/2020  . CAD (coronary artery disease)   . Epistaxis due to trauma   . Benign paroxysmal positional vertigo 05/11/2019  . Chronic headaches 05/11/2019  . Dizziness and giddiness 05/11/2019  . Atrial fibrillation with RVR ([place])   . Right hemiparesis ([place]) 05/05/2019  . Uncontrolled type 2 diabetes mellitus  with hyperglycemia ([place]) 05/05/2019  . CKD stage 3 due to type 2 diabetes mellitus ([place]) 05/05/2019  . Left-sided weakness 05/04/2019  . Aphasia 02/26/2019  . DM type 2 causing vascular disease ([place]) 06/11/2017  . Type 2 diabetes mellitus with stage 3a chronic kidney disease, with long-term current use of insulin ([place]) 06/11/2017  . Carotid stenosis 02/05/2017  . TIA (transient ischemic attack) 01/20/2017  . Carotid stenosis, right 01/20/2017  . Diabetic hyperosmolar non-ketotic state ([place]) 01/19/2017  . AKI (acute kidney injury) ([place]) 01/19/2017  . Left leg weakness 01/19/2017  . Dyslipidemia 11/24/2016  . Edema 03/30/2016  . Diaphoresis 02/18/2016  . Hypoglycemia 02/18/2016  . Bradycardia 04/22/2015  . Coronary artery disease due to lipid rich plaque 02/26/2014  . PAF (paroxysmal atrial fibrillation) ([place]) 02/26/2014  . NSTEMI (non-ST elevated myocardial infarction) ([place]) 01/03/2014  . Occlusion and stenosis of carotid artery without mention of cerebral infarction 11/21/2013  . Pain in limb-Left neck 11/21/2013  . Carotid stenosis, bilateral 11/17/2011  . HYPERTHYROIDISM, SUBCLINICAL 02/15/2010  . GERD 02/15/2010  . DYSPHAGIA UNSPECIFIED 02/15/2010  . ABDOMINAL PAIN, UNSPECIFIED SITE 02/15/2010  . Controlled insulin-dependent diabetes mellitus with neuropathy 02/11/2010  . Essential hypertension, benign 02/11/2010  . HEADACHE 02/11/2010    Past Surgical History:  Procedure Laterality Date  . APPENDECTOMY  March 2014   had  appendix frozen  . CARDIAC CATHETERIZATION  "years ago" & 01/05/2014  . CARDIOVERSION N/A 03/17/2014   Procedure: CARDIOVERSION;  Surgeon: [name], MD;  Location: [facility] ENDOSCOPY;  Service: Cardiovascular;  Laterality: N/A;  . CATARACT EXTRACTION W/ INTRAOCULAR LENS  IMPLANT, BILATERAL Bilateral 04/2013-05/2013  . ENDARTERECTOMY Right 02/05/2017   Procedure: ENDARTERECTOMY CAROTID-RIGHT;  Surgeon: [name], MD;  Location: [facility] OR;  Service:  Vascular;  Laterality: Right;  . ESOPHAGOGASTRODUODENOSCOPY  11/08/2007    Normal esophagus without evidence of Barrett, mass, erosion/ Normal stomach, duodenal bulb  . GASTRIC MOTILITY STUDY  11/13/2007   mildly delayed emptying subjectively, but normal  analysis-77% of tracer emptied at 2 hours  . JOINT REPLACEMENT    . LAPAROSCOPIC APPENDECTOMY N/A 09/15/2012   Procedure: APPENDECTOMY LAPAROSCOPIC;  Surgeon: [name], MD;  Location: [facility] ORS;  Service: General;  Laterality: N/A;  . LEFT HEART CATHETERIZATION WITH CORONARY ANGIOGRAM N/A 01/05/2014   Procedure: LEFT HEART CATHETERIZATION WITH CORONARY ANGIOGRAM;  Surgeon: [name], MD;  Location: [facility] CATH LAB;  Service: Cardiovascular;  Laterality: N/A;  . REPLACEMENT TOTAL KNEE Right 05-03-06  . TEE WITHOUT CARDIOVERSION N/A 03/17/2014   Procedure: TRANSESOPHAGEAL ECHOCARDIOGRAM (TEE);  Surgeon: [name], MD;  Location: [facility] ENDOSCOPY;  Service: Cardiovascular;  Laterality: N/A;  . TONSILLECTOMY  1972     OB History    Gravida  2   Para  2   Term  2   Preterm      AB      Living  2     SAB      IAB      Ectopic      Multiple      Live Births              Family History  Problem Relation Age of Onset  . Hyperlipidemia Mother   . Hypertension Mother   . Heart attack Mother   . Cancer Father        lung  . Hypertension Sister   . Diabetes Brother   . Heart disease Brother   . Hyperlipidemia Brother   . Hypertension Brother   . Heart attack Brother   . Other Brother        DVT    Social History   Tobacco Use  . Smoking status: Never Smoker  . Smokeless tobacco: Never Used  Vaping Use  . Vaping Use: Never used  Substance Use Topics  . Alcohol use: No  . Drug use: No    Home Medications Prior to Admission medications   Medication Sig Start Date End Date Taking? Authorizing Provider  aspirin EC 81 MG tablet Take 1 tablet (81 mg total) by mouth daily. 11/08/20   Johnson, Clanford L,  MD  atorvastatin (LIPITOR) 80 MG tablet Take 1 tablet (80 mg total) by mouth every evening. 11/26/17   Hilty, Nadean Corwin, MD  B-D ULTRAFINE III SHORT PEN 31G X 8 MM MISC SMARTSIG:Injection Daily 07/28/20   [provider]  BD INSULIN SYRINGE U/F 31G X 5/16" 0.5 ML MISC SMARTSIG:Injection 4 Times Daily 09/23/20   [provider]  carvedilol (COREG) 3.125 MG tablet Take 3.125 mg by mouth 2 (two) times daily with a meal.    [provider]  colesevelam (WELCHOL) 625 MG tablet Take 1,875 mg by mouth 2 (two) times daily with a meal. *May take 6 tablets once a day with meals*    [provider]  ELIQUIS 5  MG TABS tablet Take 1 tablet (5 mg total) by mouth 2 (two) times daily. 11/07/20   Johnson, Clanford L, MD  furosemide (LASIX) 40 MG tablet Take 40 mg by mouth daily.  10/25/17   [provider]  glipiZIDE (GLUCOTROL XL) 2.5 MG 24 hr tablet Take 1 tablet (2.5 mg total) by mouth daily with breakfast. 02/12/20   Brita Romp, NP  hydrALAZINE (APRESOLINE) 25 MG tablet Take 25 mg by mouth 3 (three) times daily. 09/12/20   [provider]  Insulin Glargine (BASAGLAR KWIKPEN) 100 UNIT/ML Inject 32 Units into the skin at bedtime. 10/14/20   Brita Romp, NP  lisinopril (ZESTRIL) 20 MG tablet Take 1 tablet (20 mg total) by mouth daily. 07/08/20   Hilty, Nadean Corwin, MD  nitroGLYCERIN (NITROSTAT) 0.4 MG SL tablet Place 1 tablet (0.4 mg total) under the tongue every 5 (five) minutes as needed for chest pain (up to 3 doses). 11/26/17   Hilty, Nadean Corwin, MD  NOVOLOG 100 UNIT/ML injection Inject 6-9 Units into the skin 3 (three) times daily with meals. 07/28/20   [provider]  [facility] ULTRA test strip  10/08/20   [provider]  Vitamin D, Cholecalciferol, 25 MCG (1000 UT) TABS Take 2,000 Units by mouth daily. Start after you finish 6 weeks of vit D 50,000    [provider]    Allergies    Penicillins  Review of Systems   Review of  Systems  Constitutional: Negative for appetite change.  HENT: Negative for congestion.   Respiratory: Negative for shortness of breath.   Cardiovascular: Positive for chest pain. Negative for leg swelling.  Gastrointestinal: Positive for nausea. Negative for abdominal pain.  Genitourinary: Negative for flank pain.  Musculoskeletal: Negative for back pain.  Skin: Negative for rash.  Neurological: Positive for syncope.  Psychiatric/Behavioral: Negative for confusion.    Physical Exam Updated Vital Signs BP (!) 136/42   Pulse 63   Temp 98 F (36.7 C) (Oral)   Resp (!) 25   SpO2 100%   Physical Exam Vitals and nursing note reviewed.  HENT:     Head: Normocephalic.     Comments: Abrasion to forehead.  Abrasion/laceration to bridge of nose. Cardiovascular:     Rate and Rhythm: Tachycardia present. Rhythm irregular.  Pulmonary:     Breath sounds: No decreased breath sounds, wheezing, rhonchi or rales.  Chest:     Chest wall: Tenderness present.     Comments: tenderness left anterior/lateral chest wall.  No crepitance.  No subcu emphysema. Musculoskeletal:     Right lower leg: No edema.     Left lower leg: No edema.  Skin:    General: Skin is warm.     Capillary Refill: Capillary refill takes less than 2 seconds.  Neurological:     Mental Status: She is alert.     ED Results / Procedures / Treatments   Labs (all labs ordered are listed, but only abnormal results are displayed) Labs Reviewed  BASIC METABOLIC PANEL - Abnormal; Notable for the following components:      Result Value   Glucose, Bld 161 (*)    BUN 51 (*)    Creatinine, Ser 1.64 (*)    GFR, Estimated 32 (*)    All other components within normal limits  CBC - Abnormal; Notable for the following components:   RBC 3.48 (*)    Hemoglobin 9.9 (*)    HCT 28.6 (*)    All other components  within normal limits  TROPONIN I (HIGH SENSITIVITY) - Abnormal; Notable for the following components:   Troponin I (High  Sensitivity) 41 (*)    All other components within normal limits  TROPONIN I (HIGH SENSITIVITY)    EKG EKG Interpretation  Date/Time:  Sunday Nov 07 2020 11:26:44 EDT Ventricular Rate:  126 PR Interval:    QRS Duration: 80 QT Interval:  320 QTC Calculation: 463 R Axis:   -15 Text Interpretation: Atrial fibrillation with rapid ventricular response with premature ventricular or aberrantly conducted complexes Minimal voltage criteria for LVH, may be normal variant ( R in aVL ) Inferior infarct , age undetermined Anterior infarct , age undetermined Abnormal ECG Confirmed by Davonna Belling [phone]) on 11/07/2020 2:09:50 PM   Radiology DG Chest 2 View  Result Date: 11/07/2020 CLINICAL DATA:  Chest pain and emesis. EXAM: CHEST - 2 VIEW COMPARISON:  11/05/2020 FINDINGS: Normal heart size. Aortic atherosclerosis. Asymmetric elevation of right hemidiaphragm is unchanged. The visualized osseous structures are unremarkable. IMPRESSION: No active cardiopulmonary abnormalities. Electronically Signed   By: Kerby Moors M.D.   On: 11/07/2020 12:28   DG Chest 2 View  Result Date: 11/05/2020 CLINICAL DATA:  75 year old who fell earlier today. Initial encounter. Current history of hypertension and diabetes. EXAM: CHEST - 2 VIEW COMPARISON:  04/12/2019 and earlier. FINDINGS: AP ERECT and LATERAL images were obtained. Suboptimal inspiration accounts for crowded bronchovascular markings, especially in the bases, and accentuates the cardiac silhouette. Taking this into account, cardiac silhouette mildly enlarged and stable. Thoracic aorta atherosclerotic, unchanged. Hilar and mediastinal contours otherwise unremarkable. Lungs clear. Bronchovascular markings normal. Pulmonary vascularity normal. No visible pleural effusions. No pneumothorax. Stable chronic elevation of the RIGHT hemidiaphragm. Degenerative changes involving the lower thoracic and upper lumbar spine. IMPRESSION: Suboptimal inspiration. Stable  mild cardiomegaly. No acute cardiopulmonary disease. Electronically Signed   By: Evangeline Dakin M.D.   On: 11/05/2020 16:59   CT Head Wo Contrast  Result Date: 11/05/2020 CLINICAL DATA:  Fall. EXAM: CT HEAD WITHOUT CONTRAST CT MAXILLOFACIAL WITHOUT CONTRAST CT CERVICAL SPINE WITHOUT CONTRAST TECHNIQUE: Multidetector CT imaging of the head, cervical spine, and maxillofacial structures were performed using the standard protocol without intravenous contrast. Multiplanar CT image reconstructions of the cervical spine and maxillofacial structures were also generated. COMPARISON:  CT head dated May 11, 2019. MRI head and cervical spine dated May 05, 2019. CT maxillofacial dated April 12, 2019. FINDINGS: CT HEAD FINDINGS Brain: No evidence of acute infarction, hemorrhage, hydrocephalus, extra-axial collection or mass lesion/mass effect. Stable mild atrophy. Vascular: Atherosclerotic vascular calcification of the carotid siphons. No hyperdense vessel. Skull: Normal. Negative for fracture or focal lesion. Other: None. CT MAXILLOFACIAL FINDINGS Osseous: No fracture or mandibular dislocation. No destructive process. Orbits: Negative. No traumatic or inflammatory finding. Sinuses: Chronic right maxillary sinusitis and mucoperiosteal thickening. New small air-fluid levels in the left maxillary and right sphenoid sinuses. The mastoid air cells are clear. Soft tissues: Small nasal soft tissue laceration. CT CERVICAL SPINE FINDINGS Alignment: No traumatic malalignment. Unchanged focal reversal of the normal cervical lordosis at C5-C6. Skull base and vertebrae: No acute fracture. No primary bone lesion or focal pathologic process. C5-C6 ankylosis. Soft tissues and spinal canal: No prevertebral fluid or swelling. No visible canal hematoma. Disc levels:  Progressive now severe disc height loss at C6-C7. Upper chest: Negative. Other: None. IMPRESSION: CT head: 1. No acute intracranial abnormality. CT maxillofacial: 1.  No acute maxillofacial fracture. Small nasal soft tissue laceration. 2. Chronic right maxillary sinusitis. New small  air-fluid levels in the left maxillary and right sphenoid sinuses. Correlate for acute sinusitis. CT cervical spine: 1. No acute cervical spine fracture or traumatic listhesis. 2. Progressive now severe degenerative disc disease at C6-C7. Electronically Signed   By: Titus Dubin M.D.   On: 11/05/2020 17:41   CT Cervical Spine Wo Contrast  Result Date: 11/05/2020 CLINICAL DATA:  Fall. EXAM: CT HEAD WITHOUT CONTRAST CT MAXILLOFACIAL WITHOUT CONTRAST CT CERVICAL SPINE WITHOUT CONTRAST TECHNIQUE: Multidetector CT imaging of the head, cervical spine, and maxillofacial structures were performed using the standard protocol without intravenous contrast. Multiplanar CT image reconstructions of the cervical spine and maxillofacial structures were also generated. COMPARISON:  CT head dated May 11, 2019. MRI head and cervical spine dated May 05, 2019. CT maxillofacial dated April 12, 2019. FINDINGS: CT HEAD FINDINGS Brain: No evidence of acute infarction, hemorrhage, hydrocephalus, extra-axial collection or mass lesion/mass effect. Stable mild atrophy. Vascular: Atherosclerotic vascular calcification of the carotid siphons. No hyperdense vessel. Skull: Normal. Negative for fracture or focal lesion. Other: None. CT MAXILLOFACIAL FINDINGS Osseous: No fracture or mandibular dislocation. No destructive process. Orbits: Negative. No traumatic or inflammatory finding. Sinuses: Chronic right maxillary sinusitis and mucoperiosteal thickening. New small air-fluid levels in the left maxillary and right sphenoid sinuses. The mastoid air cells are clear. Soft tissues: Small nasal soft tissue laceration. CT CERVICAL SPINE FINDINGS Alignment: No traumatic malalignment. Unchanged focal reversal of the normal cervical lordosis at C5-C6. Skull base and vertebrae: No acute fracture. No primary bone lesion or  focal pathologic process. C5-C6 ankylosis. Soft tissues and spinal canal: No prevertebral fluid or swelling. No visible canal hematoma. Disc levels:  Progressive now severe disc height loss at C6-C7. Upper chest: Negative. Other: None. IMPRESSION: CT head: 1. No acute intracranial abnormality. CT maxillofacial: 1. No acute maxillofacial fracture. Small nasal soft tissue laceration. 2. Chronic right maxillary sinusitis. New small air-fluid levels in the left maxillary and right sphenoid sinuses. Correlate for acute sinusitis. CT cervical spine: 1. No acute cervical spine fracture or traumatic listhesis. 2. Progressive now severe degenerative disc disease at C6-C7. Electronically Signed   By: Titus Dubin M.D.   On: 11/05/2020 17:41   ECHOCARDIOGRAM COMPLETE  Result Date: 11/06/2020    ECHOCARDIOGRAM REPORT   Patient Name:   Cassandra Adams [facility] Date of Exam: 11/06/2020 Medical Rec #:  [id number]            Height:       66.0 in Accession #:    [id number]           Weight:       173.1 lb Date of Birth:  03/29/1946            BSA:          1.881 m Patient Age:    86 years             BP:           138/56 mmHg Patient Gender: F                    HR:           97 bpm. Exam Location:  [facility] Procedure: 2D Echo, Cardiac Doppler and Color Doppler Indications:    Syncope R55  History:        Patient has prior history of Echocardiogram examinations, most                 recent 05/05/2019. CAD and Previous  Myocardial Infarction,                 Arrythmias:Atrial Fibrillation; Risk Factors:Hypertension,                 Diabetes and Dyslipidemia.  Sonographer:    Alvino Chapel RCS Referring Phys: [id number] [place]  1. Left ventricular ejection fraction, by estimation, is 55 to 60%. The left ventricle has normal function. The left ventricle demonstrates regional wall motion abnormalities (see scoring diagram/findings for description). There is moderate asymmetric left ventricular hypertrophy of the  basal and septal segments. Left ventricular diastolic parameters are indeterminate.  2. Right ventricular systolic function is normal. The right ventricular size is normal. There is normal pulmonary artery systolic pressure. The estimated right ventricular systolic pressure is 26.8 mmHg.  3. Left atrial size was mild to moderately dilated.  4. Possible small to moderate pericardial fluid collection at posterior base of LV. No obvious hemodynamic significance.  5. The mitral valve is abnormal, mildly calcified with restricted posterior leaflet motion. Mild mitral valve regurgitation.  6. The aortic valve is tricuspid. Aortic valve regurgitation is not visualized. Mild to moderate aortic valve sclerosis/calcification is present, without any evidence of aortic stenosis.  7. The inferior vena cava is normal in size with greater than 50% respiratory variability, suggesting right atrial pressure of 3 mmHg. FINDINGS  Left Ventricle: Left ventricular ejection fraction, by estimation, is 55 to 60%. The left ventricle has normal function. The left ventricle demonstrates regional wall motion abnormalities. The left ventricular internal cavity size was normal in size. There is moderate asymmetric left ventricular hypertrophy of the basal and septal segments. Left ventricular diastolic parameters are indeterminate.  LV Wall Scoring: The basal inferior segment is akinetic. The entire anterior wall, entire lateral wall, entire septum, entire apex, and mid and distal inferior wall are normal. Right Ventricle: The right ventricular size is normal. No increase in right ventricular wall thickness. Right ventricular systolic function is normal. There is normal pulmonary artery systolic pressure. The tricuspid regurgitant velocity is 2.45 m/s, and  with an assumed right atrial pressure of 3 mmHg, the estimated right ventricular systolic pressure is 34.1 mmHg. Left Atrium: Left atrial size was mild to moderately dilated. Right Atrium:  Right atrial size was normal in size. Pericardium: Possible small to moderate pericardial fluid collection at posterior base of LV. No obvious hemodynamic significance. The pericardium was not well visualized. Mitral Valve: The mitral valve is abnormal. There is mild calcification of the mitral valve leaflet(s). Mild mitral valve regurgitation. Tricuspid Valve: The tricuspid valve is grossly normal. Tricuspid valve regurgitation is mild. Aortic Valve: The aortic valve is tricuspid. There is mild aortic valve annular calcification. Aortic valve regurgitation is not visualized. Mild to moderate aortic valve sclerosis/calcification is present, without any evidence of aortic stenosis. Pulmonic Valve: The pulmonic valve was grossly normal. Pulmonic valve regurgitation is trivial. Aorta: The aortic root is normal in size and structure. Venous: The inferior vena cava is normal in size with greater than 50% respiratory variability, suggesting right atrial pressure of 3 mmHg. IAS/Shunts: No atrial level shunt detected by color flow Doppler.  LEFT VENTRICLE PLAX 2D LVIDd:         4.40 cm  Diastology LVIDs:         3.40 cm  LV e' medial:    6.20 cm/s LV PW:         1.20 cm  LV E/e' medial:  15.7 LV IVS:  1.80 cm  LV e' lateral:   9.79 cm/s LVOT diam:     1.90 cm  LV E/e' lateral: 9.9 LV SV:         50 LV SV Index:   27 LVOT Area:     2.84 cm  RIGHT VENTRICLE RV S prime:     8.22 cm/s TAPSE (M-mode): 1.3 cm LEFT ATRIUM             Index       RIGHT ATRIUM           Index LA diam:        4.10 cm 2.18 cm/m  RA Area:     15.00 cm LA Vol (A2C):   80.5 ml 42.81 ml/m RA Volume:   36.60 ml  19.46 ml/m LA Vol (A4C):   75.3 ml 40.04 ml/m LA Biplane Vol: 77.8 ml 41.37 ml/m  AORTIC VALVE LVOT Vmax:   85.60 cm/s LVOT Vmean:  62.500 cm/s LVOT VTI:    0.178 m  AORTA Ao Root diam: 3.40 cm MITRAL VALVE               TRICUSPID VALVE MV Area (PHT): 3.60 cm    TR Peak grad:   24.0 mmHg MV Decel Time: 211 msec    TR Vmax:         245.00 cm/s MV E velocity: 97.30 cm/s                            SHUNTS                            Systemic VTI:  0.18 m                            Systemic Diam: 1.90 cm Rozann Lesches MD Electronically signed by Rozann Lesches MD Signature Date/Time: 11/06/2020/12:38:48 PM    Final    CT Maxillofacial Wo Contrast  Result Date: 11/05/2020 CLINICAL DATA:  Fall. EXAM: CT HEAD WITHOUT CONTRAST CT MAXILLOFACIAL WITHOUT CONTRAST CT CERVICAL SPINE WITHOUT CONTRAST TECHNIQUE: Multidetector CT imaging of the head, cervical spine, and maxillofacial structures were performed using the standard protocol without intravenous contrast. Multiplanar CT image reconstructions of the cervical spine and maxillofacial structures were also generated. COMPARISON:  CT head dated May 11, 2019. MRI head and cervical spine dated May 05, 2019. CT maxillofacial dated April 12, 2019. FINDINGS: CT HEAD FINDINGS Brain: No evidence of acute infarction, hemorrhage, hydrocephalus, extra-axial collection or mass lesion/mass effect. Stable mild atrophy. Vascular: Atherosclerotic vascular calcification of the carotid siphons. No hyperdense vessel. Skull: Normal. Negative for fracture or focal lesion. Other: None. CT MAXILLOFACIAL FINDINGS Osseous: No fracture or mandibular dislocation. No destructive process. Orbits: Negative. No traumatic or inflammatory finding. Sinuses: Chronic right maxillary sinusitis and mucoperiosteal thickening. New small air-fluid levels in the left maxillary and right sphenoid sinuses. The mastoid air cells are clear. Soft tissues: Small nasal soft tissue laceration. CT CERVICAL SPINE FINDINGS Alignment: No traumatic malalignment. Unchanged focal reversal of the normal cervical lordosis at C5-C6. Skull base and vertebrae: No acute fracture. No primary bone lesion or focal pathologic process. C5-C6 ankylosis. Soft tissues and spinal canal: No prevertebral fluid or swelling. No visible canal hematoma. Disc  levels:  Progressive now severe disc height loss at C6-C7. Upper chest: Negative. Other: None. IMPRESSION: CT head: 1. No  acute intracranial abnormality. CT maxillofacial: 1. No acute maxillofacial fracture. Small nasal soft tissue laceration. 2. Chronic right maxillary sinusitis. New small air-fluid levels in the left maxillary and right sphenoid sinuses. Correlate for acute sinusitis. CT cervical spine: 1. No acute cervical spine fracture or traumatic listhesis. 2. Progressive now severe degenerative disc disease at C6-C7. Electronically Signed   By: Titus Dubin M.D.   On: 11/05/2020 17:41    Procedures Procedures   Medications Ordered in ED Medications  ondansetron (ZOFRAN) injection 4 mg (has no administration in time range)    ED Course  I have reviewed the triage vital signs and the nursing notes.  Pertinent labs & imaging results that were available during my care of the patient were reviewed by me and considered in my medical decision making (see chart for details).    MDM Rules/Calculators/A&P                          Patient was discharged from the hospital yesterday.  Now developed left anterior chest pain.  She is point tender one spot over the ribs.  Potentially could be injury secondary to fall.  However troponin is mildly elevated.  Also hemoglobin is decreased.  I think is likely due to the nosebleed she had with a fall couple days ago.  Denies any new bleeding.  EKG reassuring.  Chest CT done due to trauma.  Chest x-ray reassuring.  However with previous coronary artery disease chest pain and slightly positive troponin I feels the patient would benefit from admission to the hospital.  Will discuss with hospitalist. Final Clinical Impression(s) / ED Diagnoses Final diagnoses:  Chest pain, unspecified type    Rx / DC Orders ED Discharge Orders    None       Davonna Belling, MD 11/07/20 [phone]

## 2020-11-08 ENCOUNTER — Encounter (HOSPITAL_COMMUNITY): Payer: Self-pay | Admitting: Internal Medicine

## 2020-11-08 ENCOUNTER — Inpatient Hospital Stay (HOSPITAL_COMMUNITY): Payer: Medicare HMO | Admitting: Certified Registered Nurse Anesthetist

## 2020-11-08 ENCOUNTER — Encounter (HOSPITAL_COMMUNITY)
Admission: EM | Disposition: A | Discharge status: Home / Self Care | Payer: Self-pay | Source: Home / Self Care | Attending: Family Medicine

## 2020-11-08 DIAGNOSIS — K920 Hematemesis: Secondary | ICD-10-CM

## 2020-11-08 DIAGNOSIS — D62 Acute posthemorrhagic anemia: Secondary | ICD-10-CM | POA: Diagnosis not present

## 2020-11-08 DIAGNOSIS — K3189 Other diseases of stomach and duodenum: Secondary | ICD-10-CM

## 2020-11-08 DIAGNOSIS — N189 Chronic kidney disease, unspecified: Secondary | ICD-10-CM | POA: Diagnosis not present

## 2020-11-08 DIAGNOSIS — N179 Acute kidney failure, unspecified: Secondary | ICD-10-CM | POA: Diagnosis not present

## 2020-11-08 HISTORY — PX: ESOPHAGOGASTRODUODENOSCOPY (EGD) WITH PROPOFOL: SHX5813

## 2020-11-08 LAB — HEMOGLOBIN AND HEMATOCRIT, BLOOD
HCT: 24.4 % — ABNORMAL LOW (ref 36.0–46.0)
HCT: 25 % — ABNORMAL LOW (ref 36.0–46.0)
HCT: 24.3 % — ABNORMAL LOW (ref 36.0–46.0)
Hemoglobin: 8.5 g/dL — ABNORMAL LOW (ref 12.0–15.0)
Hemoglobin: 8.6 g/dL — ABNORMAL LOW (ref 12.0–15.0)
Hemoglobin: 8.5 g/dL — ABNORMAL LOW (ref 12.0–15.0)

## 2020-11-08 LAB — BASIC METABOLIC PANEL
Anion gap: <3 — ABNORMAL LOW (ref 5–15)
BUN: 37 mg/dL — ABNORMAL HIGH (ref 8–23)
CO2: 27 mmol/L (ref 22–32)
Calcium: 9 mg/dL (ref 8.9–10.3)
Chloride: 111 mmol/L (ref 98–111)
Creatinine, Ser: 1.41 mg/dL — ABNORMAL HIGH (ref 0.44–1.00)
GFR, Estimated: 39 mL/min — ABNORMAL LOW (ref >60)
Glucose, Bld: 58 mg/dL — ABNORMAL LOW (ref 70–99)
Potassium: 3.9 mmol/L (ref 3.5–5.1)
Sodium: 140 mmol/L (ref 135–145)

## 2020-11-08 LAB — GLUCOSE, CAPILLARY
Glucose-Capillary: 111 mg/dL — ABNORMAL HIGH (ref 70–99)
Glucose-Capillary: 121 mg/dL — ABNORMAL HIGH (ref 70–99)
Glucose-Capillary: 136 mg/dL — ABNORMAL HIGH (ref 70–99)
Glucose-Capillary: 143 mg/dL — ABNORMAL HIGH (ref 70–99)
Glucose-Capillary: 261 mg/dL — ABNORMAL HIGH (ref 70–99)
Glucose-Capillary: 59 mg/dL — ABNORMAL LOW (ref 70–99)

## 2020-11-08 SURGERY — ESOPHAGOGASTRODUODENOSCOPY (EGD) WITH PROPOFOL
Anesthesia: Monitor Anesthesia Care

## 2020-11-08 MED ORDER — ACETAMINOPHEN 325 MG PO TABS
650.0000 mg | ORAL_TABLET | ORAL | Status: DC | PRN
  Administered 2020-11-08 – 2020-11-10 (×3): 650 mg via ORAL
  Filled 2020-11-08 (×3): qty 2

## 2020-11-08 MED ORDER — DEXTROSE 50 % IV SOLN: 12.5000 g | INTRAVENOUS | Status: AC

## 2020-11-08 MED ORDER — CARVEDILOL 3.125 MG PO TABS
3.1250 mg | ORAL_TABLET | Freq: Two times a day (BID) | ORAL | Status: DC
  Administered 2020-11-08 – 2020-11-11 (×7): 3.125 mg via ORAL
  Filled 2020-11-08 (×7): qty 1

## 2020-11-08 MED ORDER — PROPOFOL 500 MG/50ML IV EMUL
INTRAVENOUS | Status: DC | PRN
  Administered 2020-11-08: 75 ug/kg/min via INTRAVENOUS

## 2020-11-08 MED ORDER — DEXTROSE 50 % IV SOLN
INTRAVENOUS | Status: AC
  Administered 2020-11-08: 12.5 g via INTRAVENOUS
  Filled 2020-11-08: qty 50

## 2020-11-08 MED ORDER — PROPOFOL 10 MG/ML IV BOLUS
INTRAVENOUS | Status: DC | PRN
  Administered 2020-11-08 (×2): 20 mg via INTRAVENOUS

## 2020-11-08 MED ORDER — APIXABAN 5 MG PO TABS
5.0000 mg | ORAL_TABLET | Freq: Two times a day (BID) | ORAL | Status: DC
  Administered 2020-11-08 – 2020-11-11 (×6): 5 mg via ORAL
  Filled 2020-11-08 (×6): qty 1

## 2020-11-08 SURGICAL SUPPLY — 15 items

## 2020-11-08 NOTE — Anesthesia Postprocedure Evaluation (Signed)
Anesthesia Post Note  Patient: Cassandra Adams  Procedure(s) Performed: ESOPHAGOGASTRODUODENOSCOPY (EGD) WITH PROPOFOL (N/A )     Patient location during evaluation: PACU Anesthesia Type: MAC Level of consciousness: awake and alert and oriented Pain management: pain level controlled Vital Signs Assessment: post-procedure vital signs reviewed and stable Respiratory status: spontaneous breathing, nonlabored ventilation and respiratory function stable Cardiovascular status: blood pressure returned to baseline Postop Assessment: no apparent nausea or vomiting Anesthetic complications: no   No complications documented.  Last Vitals:  Vitals:   11/08/20 1300 11/08/20 1310  BP: (!) 142/45 (!) 135/46  Pulse: (!) 52 71  Resp: 16 18  Temp:    SpO2: 99% 100%    Last Pain:  Vitals:   11/08/20 1302  TempSrc:   PainSc: 0-No pain                 Brennan Bailey

## 2020-11-08 NOTE — Transfer of Care (Signed)
Immediate Anesthesia Transfer of Care Note  Patient: Cassandra Adams  Procedure(s) Performed: ESOPHAGOGASTRODUODENOSCOPY (EGD) WITH PROPOFOL (N/A )  Patient Location: Endoscopy Unit  Anesthesia Type:MAC  Level of Consciousness: drowsy and patient cooperative  Airway & Oxygen Therapy: Patient Spontanous Breathing  Post-op Assessment: Report given to RN, Post -op Vital signs reviewed and stable and Patient moving all extremities X 4  Post vital signs: Reviewed and stable  Last Vitals:  Vitals Value Taken Time  BP    Temp    Pulse 79 11/08/20 1252  Resp 18 11/08/20 1252  SpO2 99 % 11/08/20 1252  Vitals shown include unvalidated device data.  Last Pain:  Vitals:   11/08/20 1132  TempSrc: Temporal  PainSc: 0-No pain         Complications: No complications documented.

## 2020-11-08 NOTE — Anesthesia Preprocedure Evaluation (Addendum)
Anesthesia Evaluation  Patient identified by MRN, date of birth, ID band Patient awake    Reviewed: Allergy & Precautions, NPO status , Patient's Chart, lab work & pertinent test results  History of Anesthesia Complications Negative for: history of anesthetic complications  Airway Mallampati: II  TM Distance: >3 FB Neck ROM: Full    Dental  (+) Edentulous Upper, Edentulous Lower   Pulmonary neg pulmonary ROS,    Pulmonary exam normal        Cardiovascular hypertension, Pt. on medications + CAD and + Past MI (2015)  Normal cardiovascular exam+ dysrhythmias (on Eliquis) Atrial Fibrillation   TTE 11/06/20: EF 55-60%, moderate LVH, mild to moderate LAE, possible small to moderate pericardial fluid collection at posterior base of LV, mitral valve is abnormal with restricted posterior leaflet motion, mild MR   Neuro/Psych  Headaches, TIA (1978)negative psych ROS   GI/Hepatic Neg liver ROS, GERD  ,Upper GI bleed   Endo/Other  diabetes, Type 2, Insulin Dependent  Renal/GU Renal InsufficiencyRenal disease  negative genitourinary   Musculoskeletal  (+) Arthritis ,   Abdominal   Peds  Hematology  (+) Sickle cell trait and anemia , Hgb 8.5   Anesthesia Other Findings Day of surgery medications reviewed with patient.  Reproductive/Obstetrics negative OB ROS                            Anesthesia Physical Anesthesia Plan  ASA: III and emergent  Anesthesia Plan: MAC   Post-op Pain Management:    Induction:   PONV Risk Score and Plan: Treatment may vary due to age or medical condition and Propofol infusion  Airway Management Planned: Natural Airway and Nasal Cannula  Additional Equipment: None  Intra-op Plan:   Post-operative Plan:   Informed Consent: I have reviewed the patients History and Physical, chart, labs and discussed the procedure including the risks, benefits and alternatives for  the proposed anesthesia with the patient or authorized representative who has indicated his/her understanding and acceptance.       Plan Discussed with: CRNA  Anesthesia Plan Comments:        Anesthesia Quick Evaluation

## 2020-11-08 NOTE — Progress Notes (Signed)
Pt c/o 6/10 headache and right neck pain s/p EGD earlier today. MD paged for PRN med.

## 2020-11-08 NOTE — Op Note (Signed)
[facility] Patient Name: Cassandra Adams Procedure Date : 11/08/2020 MRN: [id number] Attending MD: Thornton Park MD, MD Date of Birth: Oct 14, 1945 CSN: [id number] Age: 75 Admit Type: Inpatient Procedure:                Upper GI endoscopy Indications:              Hematemesis, recent epistaxis, anemia Providers:                Thornton Park MD, MD, Jeanella Cara, RN,                            Tyna Jaksch Technician Referring MD:              Medicines:                Monitored Anesthesia Care Complications:            No immediate complications. Estimated Blood Loss:     Estimated blood loss: none. Procedure:                Pre-Anesthesia Assessment:                           - Prior to the procedure, a History and Physical                            was performed, and patient medications and                            allergies were reviewed. The patient's tolerance of                            previous anesthesia was also reviewed. The risks                            and benefits of the procedure and the sedation                            options and risks were discussed with the patient.                            All questions were answered, and informed consent                            was obtained. Prior Anticoagulants: The patient has                            taken Eliquis (apixaban), last dose was 1 day prior                            to procedure. ASA Grade Assessment: III - A patient                            with severe systemic disease. After reviewing the  risks and benefits, the patient was deemed in                            satisfactory condition to undergo the procedure.                           After obtaining informed consent, the endoscope was                            passed under direct vision. Throughout the                            procedure, the patient's blood pressure, pulse, and                             oxygen saturations were monitored continuously. The                            GIF-H190 ([id number]) Olympus gastroscope was                            introduced through the mouth, and advanced to the                            third part of duodenum. The upper GI endoscopy was                            accomplished without difficulty. The patient                            tolerated the procedure well. Scope In: Scope Out: Findings:      The Z-line was regular and was found 39 cm from the incisors. The       esophagus is otherwise normal.      Small, scattered erosions with no bleeding and no stigmata of recent       bleeding was found in the antrum. A more nodular single erosion was       present in the cardia. The prepyloris gastric mucosa is slightly       congested. Biopsies were not obtained as she had Eliquis yesterday.      The examined duodenum was normal. Impression:               - Erosive gastropathy with no bleeding and no                            stigmata of recent bleeding.                           - No evidence for active or recent bleeding.                           - Coffee ground emesis may be best explained by  recent epistaxis. Recommendation:           - Return patient to hospital ward for ongoing care.                           - Resume regular diet.                           - Continue present medications including Eliquis.                           - Pantoprazole 40 mg BID x 12 weeks.                           - Avoid NSAIDs as able.                           - Consider repeat endoscopy in 10-12 weeks to                            follow-up on the nodular erosion in the cardia.                           - Follow-up as an outpatient at [facility]                            Gastroenterology.                           The results and my recommendations were discussed                            with the patient's  daughter by phone. All questions                            were answered to her satisfaction.                           The inpatient GI team will move to stand-by. Please                            call the on-call gastroenterologist with any                            additional questions or concerns during this                            hospitalization. Procedure Code(s):        --- Professional ---                           [phone], Esophagogastroduodenoscopy, flexible,                            transoral; diagnostic, including collection of  specimen(s) by brushing or washing, when performed                            (separate procedure) Diagnosis Code(s):        --- Professional ---                           K31.89, Other diseases of stomach and duodenum                           K92.0, Hematemesis CPT copyright 2019 American Medical Association. All rights reserved. The codes documented in this report are preliminary and upon coder review may  be revised to meet current compliance requirements. Thornton Park MD, MD 11/08/2020 1:09:13 PM This report has been signed electronically. Number of Addenda: 0

## 2020-11-08 NOTE — Care Management Important Message (Signed)
Important Message  Patient Details  Name: NARA PATERNOSTER MRN: [id number] Date of Birth: 03-Apr-1946   Medicare Important Message Given:  Yes     Shelda Altes 11/08/2020, 9:30 AM

## 2020-11-08 NOTE — Progress Notes (Signed)
Pt had CBG of 59. 12.5g of IV D50 given. CBG 136 15 min later. Patient feeling tired. Patient was NPO for possible EGD study.

## 2020-11-08 NOTE — Progress Notes (Signed)
[facility]  PROGRESS NOTE  Cassandra Adams BOF:[id number] DOB: 03-16-1946 DOA: 11/07/2020 PCP: Sharilyn Sites, MD   Brief HPI:   75 year old female with history of hypertension, TIA, atrial fibrillation on Eliquis, CAD, diabetes mellitus type 2, sickle cell trait, CKD stage III presented with chest pain, nausea and vomiting.  She was seen at an event hospital after having a suspected syncopal episode sustaining abrasion to her forehead and nose with epistaxis.  She had negative CT scan of the head and cervical spine.  Echocardiogram showed EF of 55 to 60% with indeterminate diastolic parameters.  Patient was discharged home.  Later on patient developed vomiting.  Emesis was reportedly coffee-ground in appearance.  Patient is on Eliquis and aspirin at home.  Denies use of NSAIDs, abdominal pain, dysuria or dark stools. Gastroenterology was consulted for EGD today.   Subjective   Patient seen and examined, underwent EGD which was unremarkable.  Coffee-ground emesis is likely from swallowed blood from epistaxis.   Assessment/Plan:     1. Acute blood loss anemia-patient hemoglobin dropped to 9.9, it was 13.8 few days ago.  She was given Kcentra, Eliquis was on hold.  Started on Protonix drip.  GI was consulted, patient underwent EGD today.  EGD showed erosive gastropathy with no stigmata of bleeding.  GI recommended Protonix 40 mg p.o. twice daily for 12 weeks and restart Eliquis. 2. Atypical chest pain-patient reportedly had chest pain and was tender to palpation on examination by the admitting provider.  CT scan of chest showed no signs of infection.  High-sensitivity troponin was 41>> 34.  Chest pain has resolved at this time.  We will continue to monitor. 3. Paroxysmal atrial fibrillation-patient is on chronic anticoagulation with Eliquis.  This morning heart rate was more than 120.  Heart rate has improved.  Will restart home medication Coreg 3.125 mg p.o. twice daily will restart  Eliquis as per GI recommendation. 4. Transient hypotension-patient reportedly had low blood pressure of 90/43, which improved shortly thereafter.  Currently blood pressure is stable. 5. Acute kidney injury on CKD stage IIIb-on admission creatinine was elevated up to 1.54 with BUN 51.  Creatinine has improved to 1.41 with IV fluids.  Continue normal saline.  Follow BMP in am. 6. Diabetes mellitus type 2-last hemoglobin A1c was 8.1on 10/14/2020 .  Continue sliding scale insulin with NovoLog.  CBG well controlled. 7.    Scheduled medications:   . Chlorhexidine Gluconate Cloth  6 each Topical Daily  . insulin aspart  0-9 Units Subcutaneous Q6H  . [START ON 11/11/2020] pantoprazole  40 mg Intravenous Q12H  . sodium chloride flush  10-40 mL Intracatheter Q12H         Data Reviewed:   CBG:  Recent Labs  Lab 11/08/20 0030 11/08/20 0612 11/08/20 0649 11/08/20 0827 11/08/20 1349  GLUCAP 111* 59* 136* 121* 143*    SpO2: 100 %    Vitals:   11/08/20 1132 11/08/20 1250 11/08/20 1300 11/08/20 1310  BP: (!) 151/69 (!) 96/37 (!) 142/45 (!) 135/46  Pulse: 88 (!) 112 (!) 52 71  Resp: 20 18 16 18   Temp: 97.9 F (36.6 C) (!) 97.5 F (36.4 C)    TempSrc: Temporal Temporal    SpO2: 100% 100% 99% 100%  Weight:         Intake/Output Summary (Last 24 hours) at 11/08/2020 1546 Last data filed at 11/08/2020 0600 Gross per 24 hour  Intake 1192.76 ml  Output 400 ml  Net 792.76 ml  05/14 1901 - 05/16 0700 In: 1192.8 [P.O.:240; I.V.:952.8] Out: 400 [Urine:400]  Filed Weights   11/08/20 0500  Weight: 82.1 kg    CBC:  Recent Labs  Lab 11/05/20 1550 11/07/20 1132 11/07/20 1727 11/07/20 2220 11/08/20 0441 11/08/20 1023  WBC 7.0 4.7  --   --   --   --   HGB 13.8 9.9* 9.5* 8.8* 8.5* 8.6*  HCT 40.9 28.6* 27.4* 25.6* 24.4* 25.0*  PLT 203 178  --   --   --   --   MCV 84.3 82.2  --   --   --   --   MCH 28.5 28.4  --   --   --   --   MCHC 33.7 34.6  --   --   --   --   RDW 14.7  14.6  --   --   --   --   LYMPHSABS 1.0  --   --   --   --   --   MONOABS 0.3  --   --   --   --   --   EOSABS 0.1  --   --   --   --   --   BASOSABS 0.0  --   --   --   --   --     Complete metabolic panel:  Recent Labs  Lab 11/05/20 1550 11/05/20 1959 11/06/20 0512 11/07/20 1132 11/08/20 0441  NA 139  --   --  139 140  K 4.2  --   --  4.4 3.9  CL 106  --   --  109 111  CO2 26  --   --  23 27  GLUCOSE 111*  --   --  161* 58*  BUN 33*  --   --  51* 37*  CREATININE 1.15*  --   --  1.64* 1.41*  CALCIUM 9.7  --   --  9.9 9.0  AST 31  --   --   --   --   ALT 23  --   --   --   --   ALKPHOS 83  --   --   --   --   BILITOT 0.6  --   --   --   --   ALBUMIN 3.6  --   --   --   --   MG  --  1.9  --   --   --   INR  --  1.1 1.4*  --   --   HGBA1C  --   --  8.2*  --   --     No results for input(s): LIPASE, AMYLASE in the last 168 hours.  Recent Labs  Lab 11/05/20 1922  SARSCOV2NAA NEGATIVE    ------------------------------------------------------------------------------------------------------------------ No results for input(s): CHOL, HDL, LDLCALC, TRIG, CHOLHDL, LDLDIRECT in the last 72 hours.  Lab Results  Component Value Date   HGBA1C 8.2 (H) 11/06/2020   ------------------------------------------------------------------------------------------------------------------ No results for input(s): TSH, T4TOTAL, T3FREE, THYROIDAB in the last 72 hours.  Invalid input(s): FREET3 ------------------------------------------------------------------------------------------------------------------ No results for input(s): VITAMINB12, FOLATE, FERRITIN, TIBC, IRON, RETICCTPCT in the last 72 hours.  Coagulation profile Recent Labs  Lab 11/05/20 1959 11/06/20 0512  INR 1.1 1.4*   No results for input(s): DDIMER in the last 72 hours.  Cardiac Enzymes Recent Labs  Lab 11/05/20 1550 11/07/20 1727  CKTOTAL 223 117     ------------------------------------------------------------------------------------------------------------------ No results found for: BNP  Antibiotics: Anti-infectives (From admission, onward)   None       Radiology Reports  DG Chest 2 View  Result Date: 11/07/2020 CLINICAL DATA:  Chest pain and emesis. EXAM: CHEST - 2 VIEW COMPARISON:  11/05/2020 FINDINGS: Normal heart size. Aortic atherosclerosis. Asymmetric elevation of right hemidiaphragm is unchanged. The visualized osseous structures are unremarkable. IMPRESSION: No active cardiopulmonary abnormalities. Electronically Signed   By: Kerby Moors M.D.   On: 11/07/2020 12:28   CT Chest Wo Contrast  Result Date: 11/07/2020 CLINICAL DATA:  Rib fracture suspected EXAM: CT CHEST WITHOUT CONTRAST TECHNIQUE: Multidetector CT imaging of the chest was performed following the standard protocol without IV contrast. COMPARISON:  None. FINDINGS: Cardiovascular: Aortic atherosclerosis. Cardiomegaly. Three-vessel coronary artery calcifications. No pericardial effusion. Enlargement of the main pulmonary artery measuring 3.8 cm in caliber. Mediastinum/Nodes: No enlarged mediastinal, hilar, or axillary lymph nodes. Thyroid gland, trachea, and esophagus demonstrate no significant findings. Lungs/Pleura: Lungs are clear. No pleural effusion or pneumothorax. Upper Abdomen: No acute abnormality. Musculoskeletal: No chest wall mass or suspicious bone lesions identified. IMPRESSION: 1. No displaced rib fracture or other noncontrast CT evidence of acute traumatic injury to the chest. 2. Cardiomegaly and coronary artery disease. 3. Enlargement of the main pulmonary artery, as can be seen in pulmonary hypertension. Aortic Atherosclerosis (ICD10-I70.0). Electronically Signed   By: Eddie Candle M.D.   On: 11/07/2020 15:52      DVT prophylaxis: SCDs  Code Status: Full code  Family Communication: No family at  bedside   Consultants:  Gastroenterology  Procedures:      Objective    Physical Examination:   General-appears in no acute distress Heart-S1-S2, regular, no murmur auscultated Lungs-clear to auscultation bilaterally, no wheezing or crackles auscultated Abdomen-soft, nontender, no organomegaly Extremities-no edema in the lower extremities Neuro-alert, oriented x3, no focal deficit noted  Status is: Inpatient  Dispo: The patient is from: Home              Anticipated d/c is to: Home              Anticipated d/c date is: 11/09/2020              Patient currently not stable for discharge  Barrier to discharge-had EGD, observation overnight for bleeding  COVID-19 Labs  No results for input(s): DDIMER, FERRITIN, LDH, CRP in the last 72 hours.  Lab Results  Component Value Date   SARSCOV2NAA NEGATIVE 11/05/2020   SARSCOV2NAA NEGATIVE 05/04/2019   [place] NEGATIVE 02/26/2019   [place] Not Detected 01/10/2019    Microbiology  Recent Results (from the past 240 hour(s))  Resp Panel by RT-PCR (Flu A&B, Covid) Nasopharyngeal Swab     Status: None   Collection Time: 11/05/20  7:22 PM   Specimen: Nasopharyngeal Swab; Nasopharyngeal(NP) swabs in vial transport medium  Result Value Ref Range Status   SARS Coronavirus 2 by RT PCR NEGATIVE NEGATIVE Final    Comment: (NOTE) SARS-CoV-2 target nucleic acids are NOT DETECTED.  The SARS-CoV-2 RNA is generally detectable in upper respiratory specimens during the acute phase of infection. The lowest concentration of SARS-CoV-2 viral copies this assay can detect is 138 copies/mL. A negative result does not preclude SARS-Cov-2 infection and should not be used as the sole basis for treatment or other patient management decisions. A negative result may occur with  improper specimen collection/handling, submission of specimen other than nasopharyngeal swab, presence of viral mutation(s) within the areas targeted by this  assay, and inadequate number of viral  copies(<138 copies/mL). A negative result must be combined with clinical observations, patient history, and epidemiological information. The expected result is Negative.  Fact Sheet for Patients:  EntrepreneurPulse.com.au  Fact Sheet for Healthcare Providers:  IncredibleEmployment.be  This test is no t yet approved or cleared by the [place] FDA and  has been authorized for detection and/or diagnosis of SARS-CoV-2 by FDA under an Emergency Use Authorization (EUA). This EUA will remain  in effect (meaning this test can be used) for the duration of the COVID-19 declaration under Section 564(b)(1) of the Act, 21 U.S.C.section 360bbb-3(b)(1), unless the authorization is terminated  or revoked sooner.       Influenza A by PCR NEGATIVE NEGATIVE Final   Influenza B by PCR NEGATIVE NEGATIVE Final    Comment: (NOTE) The Xpert Xpress SARS-CoV-2/FLU/RSV plus assay is intended as an aid in the diagnosis of influenza from Nasopharyngeal swab specimens and should not be used as a sole basis for treatment. Nasal washings and aspirates are unacceptable for Xpert Xpress SARS-CoV-2/FLU/RSV testing.  Fact Sheet for Patients: EntrepreneurPulse.com.au  Fact Sheet for Healthcare Providers: IncredibleEmployment.be  This test is not yet approved or cleared by the [place] FDA and has been authorized for detection and/or diagnosis of SARS-CoV-2 by FDA under an Emergency Use Authorization (EUA). This EUA will remain in effect (meaning this test can be used) for the duration of the COVID-19 declaration under Section 564(b)(1) of the Act, 21 U.S.C. section 360bbb-3(b)(1), unless the authorization is terminated or revoked.  Performed at [facility], [address], [place], [place] [address]   MRSA PCR Screening     Status: None   Collection Time: 11/05/20  9:19 PM   Specimen:  Nasopharyngeal  Result Value Ref Range Status   MRSA by PCR NEGATIVE NEGATIVE Final    Comment:        The GeneXpert MRSA Assay (FDA approved for NASAL specimens only), is one component of a comprehensive MRSA colonization surveillance program. It is not intended to diagnose MRSA infection nor to guide or monitor treatment for MRSA infections. Performed at [facility], [address] [place], [place] [address]              [facility] If 7PM-7AM, please contact night-coverage at [email or web address], Office  [phone]   11/08/2020, 3:46 PM  LOS: 1 day

## 2020-11-08 NOTE — Interval H&P Note (Signed)
History and Physical Interval Note:  11/08/2020 12:22 PM  Cassandra Adams  has presented today for surgery, with the diagnosis of Coffee-ground emesis.  The various methods of treatment have been discussed with the patient and family. After consideration of risks, benefits and other options for treatment, the patient has consented to  Procedure(s): ESOPHAGOGASTRODUODENOSCOPY (EGD) WITH PROPOFOL (N/A) as a surgical intervention.  The patient's history has been reviewed, patient examined, no change in status, stable for surgery.  I have reviewed the patient's chart and labs.  Questions were answered to the patient's satisfaction.     Thornton Park

## 2020-11-09 DIAGNOSIS — K920 Hematemesis: Secondary | ICD-10-CM | POA: Diagnosis not present

## 2020-11-09 DIAGNOSIS — N189 Chronic kidney disease, unspecified: Secondary | ICD-10-CM | POA: Diagnosis not present

## 2020-11-09 DIAGNOSIS — N179 Acute kidney failure, unspecified: Secondary | ICD-10-CM | POA: Diagnosis not present

## 2020-11-09 DIAGNOSIS — D62 Acute posthemorrhagic anemia: Secondary | ICD-10-CM | POA: Diagnosis not present

## 2020-11-09 LAB — CBC
HCT: 23.7 % — ABNORMAL LOW (ref 36.0–46.0)
Hemoglobin: 8.2 g/dL — ABNORMAL LOW (ref 12.0–15.0)
MCH: 28.6 pg (ref 26.0–34.0)
MCHC: 34.6 g/dL (ref 30.0–36.0)
MCV: 82.6 fL (ref 80.0–100.0)
Platelets: 129 10*3/uL — ABNORMAL LOW (ref 150–400)
RBC: 2.87 MIL/uL — ABNORMAL LOW (ref 3.87–5.11)
RDW: 14.6 % (ref 11.5–15.5)
WBC: 4 10*3/uL (ref 4.0–10.5)
nRBC: 0 % (ref 0.0–0.2)

## 2020-11-09 LAB — GLUCOSE, CAPILLARY
Glucose-Capillary: 164 mg/dL — ABNORMAL HIGH (ref 70–99)
Glucose-Capillary: 220 mg/dL — ABNORMAL HIGH (ref 70–99)
Glucose-Capillary: 265 mg/dL — ABNORMAL HIGH (ref 70–99)
Glucose-Capillary: 282 mg/dL — ABNORMAL HIGH (ref 70–99)

## 2020-11-09 LAB — BASIC METABOLIC PANEL
Anion gap: 0 — ABNORMAL LOW (ref 5–15)
BUN: 25 mg/dL — ABNORMAL HIGH (ref 8–23)
CO2: 27 mmol/L (ref 22–32)
Calcium: 9.1 mg/dL (ref 8.9–10.3)
Chloride: 112 mmol/L — ABNORMAL HIGH (ref 98–111)
Creatinine, Ser: 1.64 mg/dL — ABNORMAL HIGH (ref 0.44–1.00)
GFR, Estimated: 32 mL/min — ABNORMAL LOW (ref >60)
Glucose, Bld: 177 mg/dL — ABNORMAL HIGH (ref 70–99)
Potassium: 5.1 mmol/L (ref 3.5–5.1)
Sodium: 139 mmol/L (ref 135–145)

## 2020-11-09 MED ORDER — SODIUM ZIRCONIUM CYCLOSILICATE 5 G PO PACK
5.0000 g | PACK | Freq: Once | ORAL | Status: AC
  Administered 2020-11-09: 5 g via ORAL
  Filled 2020-11-09: qty 1

## 2020-11-09 MED ORDER — ONDANSETRON HCL 4 MG/2ML IJ SOLN
4.0000 mg | INTRAMUSCULAR | Status: DC | PRN
  Administered 2020-11-09: 4 mg via INTRAVENOUS
  Filled 2020-11-09: qty 2

## 2020-11-09 MED ORDER — PANTOPRAZOLE SODIUM 40 MG PO TBEC
40.0000 mg | DELAYED_RELEASE_TABLET | Freq: Two times a day (BID) | ORAL | Status: DC
  Administered 2020-11-09 – 2020-11-11 (×5): 40 mg via ORAL
  Filled 2020-11-09 (×5): qty 1

## 2020-11-09 MED ORDER — INSULIN ASPART 100 UNIT/ML IJ SOLN
0.0000 [IU] | Freq: Four times a day (QID) | INTRAMUSCULAR | Status: DC
  Administered 2020-11-09: 3 [IU] via SUBCUTANEOUS
  Administered 2020-11-09: 5 [IU] via SUBCUTANEOUS
  Administered 2020-11-09: 2 [IU] via SUBCUTANEOUS
  Administered 2020-11-10 (×2): 3 [IU] via SUBCUTANEOUS

## 2020-11-09 NOTE — Progress Notes (Signed)
[facility]  PROGRESS NOTE  Cassandra Adams KNL:[id number] DOB: December 19, 1945 DOA: 11/07/2020 PCP: Sharilyn Sites, MD   Brief HPI:   75 year old female with history of hypertension, TIA, atrial fibrillation on Eliquis, CAD, diabetes mellitus type 2, sickle cell trait, CKD stage III presented with chest pain, nausea and vomiting.  She was seen at an [facility] hospital after having a suspected syncopal episode sustaining abrasion to her forehead and nose with epistaxis.  She had negative CT scan of the head and cervical spine.  Echocardiogram showed EF of 55 to 60% with indeterminate diastolic parameters.  Patient was discharged home.  Later on patient developed vomiting.  Emesis was reportedly coffee-ground in appearance.  Patient is on Eliquis and aspirin at home.  Denies use of NSAIDs, abdominal pain, dysuria or dark stools. Gastroenterology was consulted for EGD today.   Subjective   Patient seen and examined, complains of nausea this morning.  S/p EGD yesterday.  EGD showed erosive gastropathy with no stigmata of bleeding.  Recommended Protonix 40 mg p.o. twice daily.   Assessment/Plan:     1. Acute blood loss anemia-patient hemoglobin dropped to 9.9, it was 13.8 few days ago.  She was given Kcentra, Eliquis was on hold.  Started on Protonix drip.  GI was consulted, patient underwent EGD.  EGD showed erosive gastropathy with no stigmata of bleeding.  GI recommended Protonix 40 mg p.o. twice daily for 12 weeks and restart Eliquis. 2. Nausea-patient is complaining of nausea this morning.  Continue Zofran as needed for nausea/vomiting.  Will discontinue IV Protonix and start Protonix 40 mg p.o. twice daily. 3. Atypical chest pain-patient reportedly had chest pain and was tender to palpation on examination by the admitting provider.  CT scan of chest showed no signs of infection.  High-sensitivity troponin was 41>> 34.  Chest pain has resolved at this time.  We will continue to  monitor. 4. Paroxysmal atrial fibrillation-patient is on chronic anticoagulation with Eliquis.  This morning heart rate was more than 120.  Heart rate has improved.  Continue home medication Coreg 3.125 mg p.o. twice daily, anticoagulation with Eliquis.   5. Transient hypotension-patient reportedly had low blood pressure of 90/43, which improved shortly thereafter.  Currently blood pressure is stable. 6. Acute kidney injury on CKD stage IIIb-on admission creatinine was elevated up to 1.54 with BUN 51.  Creatinine is elevated up to 1.64 today.  Continue IV normal saline at 75 mL/h.  Follow BMP in am.  7. Hyperkalemia-potassium is elevated at 5.1.  Will give 1 dose of Lokelma 5 g p.o. x1.  Follow BMP in am. 8. Diabetes mellitus type 2-last hemoglobin A1c was 8.1on 10/14/2020 .  Continue sliding scale insulin with NovoLog.  CBG well controlled.    Scheduled medications:   . apixaban  5 mg Oral BID  . carvedilol  3.125 mg Oral BID WC  . insulin aspart  0-9 Units Subcutaneous Q6H  . pantoprazole  40 mg Oral BID  . sodium chloride flush  10-40 mL Intracatheter Q12H         Data Reviewed:   CBG:  Recent Labs  Lab 11/08/20 0827 11/08/20 1349 11/08/20 1735 11/09/20 0022 11/09/20 0614  GLUCAP 121* 143* 261* 282* 164*    SpO2: 100 %    Vitals:   11/08/20 1310 11/08/20 2014 11/09/20 0102 11/09/20 0511  BP: (!) 135/46 (!) 126/58  (!) 141/66  Pulse: 71 79  78  Resp: 18 18  17   Temp:  98.2 F (  36.8 C)  97.9 F (36.6 C)  TempSrc:  Oral    SpO2: 100% 99%  100%  Weight:   82 kg      Intake/Output Summary (Last 24 hours) at 11/09/2020 1143 Last data filed at 11/09/2020 0615 Gross per 24 hour  Intake 1487.24 ml  Output 300 ml  Net 1187.24 ml    05/15 1901 - 05/17 0700 In: 2680 [P.O.:480; I.V.:2200] Out: 700 [Urine:700]  Filed Weights   11/08/20 0500 11/09/20 0102  Weight: 82.1 kg 82 kg    CBC:  Recent Labs  Lab 11/05/20 1550 11/07/20 1132 11/07/20 1727  11/07/20 2220 11/08/20 0441 11/08/20 1023 11/08/20 1634 11/09/20 0348  WBC 7.0 4.7  --   --   --   --   --  4.0  HGB 13.8 9.9*   < > 8.8* 8.5* 8.6* 8.5* 8.2*  HCT 40.9 28.6*   < > 25.6* 24.4* 25.0* 24.3* 23.7*  PLT 203 178  --   --   --   --   --  129*  MCV 84.3 82.2  --   --   --   --   --  82.6  MCH 28.5 28.4  --   --   --   --   --  28.6  MCHC 33.7 34.6  --   --   --   --   --  34.6  RDW 14.7 14.6  --   --   --   --   --  14.6  LYMPHSABS 1.0  --   --   --   --   --   --   --   MONOABS 0.3  --   --   --   --   --   --   --   EOSABS 0.1  --   --   --   --   --   --   --   BASOSABS 0.0  --   --   --   --   --   --   --    < > = values in this interval not displayed.    Complete metabolic panel:  Recent Labs  Lab 11/05/20 1550 11/05/20 1959 11/06/20 0512 11/07/20 1132 11/08/20 0441 11/09/20 0348  NA 139  --   --  139 140 139  K 4.2  --   --  4.4 3.9 5.1  CL 106  --   --  109 111 112*  CO2 26  --   --  23 27 27   GLUCOSE 111*  --   --  161* 58* 177*  BUN 33*  --   --  51* 37* 25*  CREATININE 1.15*  --   --  1.64* 1.41* 1.64*  CALCIUM 9.7  --   --  9.9 9.0 9.1  AST 31  --   --   --   --   --   ALT 23  --   --   --   --   --   ALKPHOS 83  --   --   --   --   --   BILITOT 0.6  --   --   --   --   --   ALBUMIN 3.6  --   --   --   --   --   MG  --  1.9  --   --   --   --   INR  --  1.1 1.4*  --   --   --   HGBA1C  --   --  8.2*  --   --   --     No results for input(s): LIPASE, AMYLASE in the last 168 hours.  Recent Labs  Lab 11/05/20 1922  SARSCOV2NAA NEGATIVE    ------------------------------------------------------------------------------------------------------------------ No results for input(s): CHOL, HDL, LDLCALC, TRIG, CHOLHDL, LDLDIRECT in the last 72 hours.  Lab Results  Component Value Date   HGBA1C 8.2 (H) 11/06/2020   ------------------------------------------------------------------------------------------------------------------ No results for  input(s): TSH, T4TOTAL, T3FREE, THYROIDAB in the last 72 hours.  Invalid input(s): FREET3 ------------------------------------------------------------------------------------------------------------------ No results for input(s): VITAMINB12, FOLATE, FERRITIN, TIBC, IRON, RETICCTPCT in the last 72 hours.  Coagulation profile Recent Labs  Lab 11/05/20 1959 11/06/20 0512  INR 1.1 1.4*   No results for input(s): DDIMER in the last 72 hours.  Cardiac Enzymes Recent Labs  Lab 11/05/20 1550 11/07/20 1727  CKTOTAL 223 117    ------------------------------------------------------------------------------------------------------------------ No results found for: BNP   Antibiotics: Anti-infectives (From admission, onward)   None       Radiology Reports  DG Chest 2 View  Result Date: 11/07/2020 CLINICAL DATA:  Chest pain and emesis. EXAM: CHEST - 2 VIEW COMPARISON:  11/05/2020 FINDINGS: Normal heart size. Aortic atherosclerosis. Asymmetric elevation of right hemidiaphragm is unchanged. The visualized osseous structures are unremarkable. IMPRESSION: No active cardiopulmonary abnormalities. Electronically Signed   By: Kerby Moors M.D.   On: 11/07/2020 12:28   CT Chest Wo Contrast  Result Date: 11/07/2020 CLINICAL DATA:  Rib fracture suspected EXAM: CT CHEST WITHOUT CONTRAST TECHNIQUE: Multidetector CT imaging of the chest was performed following the standard protocol without IV contrast. COMPARISON:  None. FINDINGS: Cardiovascular: Aortic atherosclerosis. Cardiomegaly. Three-vessel coronary artery calcifications. No pericardial effusion. Enlargement of the main pulmonary artery measuring 3.8 cm in caliber. Mediastinum/Nodes: No enlarged mediastinal, hilar, or axillary lymph nodes. Thyroid gland, trachea, and esophagus demonstrate no significant findings. Lungs/Pleura: Lungs are clear. No pleural effusion or pneumothorax. Upper Abdomen: No acute abnormality. Musculoskeletal: No chest  wall mass or suspicious bone lesions identified. IMPRESSION: 1. No displaced rib fracture or other noncontrast CT evidence of acute traumatic injury to the chest. 2. Cardiomegaly and coronary artery disease. 3. Enlargement of the main pulmonary artery, as can be seen in pulmonary hypertension. Aortic Atherosclerosis (ICD10-I70.0). Electronically Signed   By: Eddie Candle M.D.   On: 11/07/2020 15:52      DVT prophylaxis: SCDs  Code Status: Full code  Family Communication: No family at bedside   Consultants:  Gastroenterology  Procedures:      Objective    Physical Examination:   General-appears in no acute distress Heart-S1-S2, regular, no murmur auscultated Lungs-clear to auscultation bilaterally, no wheezing or crackles auscultated Abdomen-soft, nontender, no organomegaly Extremities-no edema in the lower extremities Neuro-alert, oriented x3, no focal deficit noted  Status is: Inpatient  Dispo: The patient is from: Home              Anticipated d/c is to: Home              Anticipated d/c date is: 11/10/2020              Patient currently not stable for discharge  Barrier to discharge-had EGD, observation overnight for bleeding, mild AKI, hyperkalemia  COVID-19 Labs  No results for input(s): DDIMER, FERRITIN, LDH, CRP in the last 72 hours.  Lab Results  Component Value Date   SARSCOV2NAA NEGATIVE 11/05/2020   SARSCOV2NAA  NEGATIVE 05/04/2019   [place] NEGATIVE 02/26/2019   [place] Not Detected 01/10/2019    Microbiology  Recent Results (from the past 240 hour(s))  Resp Panel by RT-PCR (Flu A&B, Covid) Nasopharyngeal Swab     Status: None   Collection Time: 11/05/20  7:22 PM   Specimen: Nasopharyngeal Swab; Nasopharyngeal(NP) swabs in vial transport medium  Result Value Ref Range Status   SARS Coronavirus 2 by RT PCR NEGATIVE NEGATIVE Final    Comment: (NOTE) SARS-CoV-2 target nucleic acids are NOT DETECTED.  The SARS-CoV-2 RNA is generally  detectable in upper respiratory specimens during the acute phase of infection. The lowest concentration of SARS-CoV-2 viral copies this assay can detect is 138 copies/mL. A negative result does not preclude SARS-Cov-2 infection and should not be used as the sole basis for treatment or other patient management decisions. A negative result may occur with  improper specimen collection/handling, submission of specimen other than nasopharyngeal swab, presence of viral mutation(s) within the areas targeted by this assay, and inadequate number of viral copies(<138 copies/mL). A negative result must be combined with clinical observations, patient history, and epidemiological information. The expected result is Negative.  Fact Sheet for Patients:  EntrepreneurPulse.com.au  Fact Sheet for Healthcare Providers:  IncredibleEmployment.be  This test is no t yet approved or cleared by the [place] FDA and  has been authorized for detection and/or diagnosis of SARS-CoV-2 by FDA under an Emergency Use Authorization (EUA). This EUA will remain  in effect (meaning this test can be used) for the duration of the COVID-19 declaration under Section 564(b)(1) of the Act, 21 U.S.C.section 360bbb-3(b)(1), unless the authorization is terminated  or revoked sooner.       Influenza A by PCR NEGATIVE NEGATIVE Final   Influenza B by PCR NEGATIVE NEGATIVE Final    Comment: (NOTE) The Xpert Xpress SARS-CoV-2/FLU/RSV plus assay is intended as an aid in the diagnosis of influenza from Nasopharyngeal swab specimens and should not be used as a sole basis for treatment. Nasal washings and aspirates are unacceptable for Xpert Xpress SARS-CoV-2/FLU/RSV testing.  Fact Sheet for Patients: EntrepreneurPulse.com.au  Fact Sheet for Healthcare Providers: IncredibleEmployment.be  This test is not yet approved or cleared by the [place] FDA  and has been authorized for detection and/or diagnosis of SARS-CoV-2 by FDA under an Emergency Use Authorization (EUA). This EUA will remain in effect (meaning this test can be used) for the duration of the COVID-19 declaration under Section 564(b)(1) of the Act, 21 U.S.C. section 360bbb-3(b)(1), unless the authorization is terminated or revoked.  Performed at [facility], [address] [place], [place] [address]   MRSA PCR Screening     Status: None   Collection Time: 11/05/20  9:19 PM   Specimen: Nasopharyngeal  Result Value Ref Range Status   MRSA by PCR NEGATIVE NEGATIVE Final    Comment:        The GeneXpert MRSA Assay (FDA approved for NASAL specimens only), is one component of a comprehensive MRSA colonization surveillance program. It is not intended to diagnose MRSA infection nor to guide or monitor treatment for MRSA infections. Performed at [facility], [address], [place], [place] [address]              [facility] If 7PM-7AM, please contact night-coverage at [email or web address], Office  [phone]   11/09/2020, 11:43 AM  LOS: 2 days

## 2020-11-09 NOTE — Plan of Care (Signed)
  Problem: Education: Goal: Knowledge of General Education information will improve Description: Including pain rating scale, medication(s)/side effects and non-pharmacologic comfort measures Outcome: Progressing   Problem: Health Behavior/Discharge Planning: Goal: Ability to manage health-related needs will improve Outcome: Progressing   Problem: Clinical Measurements: Goal: Ability to maintain clinical measurements within normal limits will improve Outcome: Progressing Goal: Will remain free from infection Outcome: Progressing Goal: Diagnostic test results will improve Outcome: Progressing Goal: Respiratory complications will improve Outcome: Progressing Goal: Cardiovascular complication will be avoided Outcome: Progressing   Problem: Activity: Goal: Risk for activity intolerance will decrease Outcome: Progressing   Problem: Nutrition: Goal: Adequate nutrition will be maintained Outcome: Progressing   Problem: Coping: Goal: Level of anxiety will decrease Outcome: Progressing   Problem: Elimination: Goal: Will not experience complications related to bowel motility Outcome: Progressing Goal: Will not experience complications related to urinary retention Outcome: Progressing   Problem: Pain Managment: Goal: General experience of comfort will improve Outcome: Progressing   Problem: Safety: Goal: Ability to remain free from injury will improve Outcome: Progressing   Problem: Skin Integrity: Goal: Risk for impaired skin integrity will decrease Outcome: Progressing   Problem: Education: Goal: Understanding of cardiac disease, CV risk reduction, and recovery process will improve Outcome: Progressing Goal: Individualized Educational Video(s) Outcome: Progressing   Problem: Activity: Goal: Ability to tolerate increased activity will improve Outcome: Progressing   Problem: Cardiac: Goal: Ability to achieve and maintain adequate cardiovascular perfusion will  improve Outcome: Progressing   Problem: Health Behavior/Discharge Planning: Goal: Ability to safely manage health-related needs after discharge will improve Outcome: Progressing   Problem: Education: Goal: Knowledge of disease or condition will improve Outcome: Progressing Goal: Understanding of medication regimen will improve Outcome: Progressing Goal: Individualized Educational Video(s) Outcome: Progressing   Problem: Activity: Goal: Ability to tolerate increased activity will improve Outcome: Progressing   Problem: Cardiac: Goal: Ability to achieve and maintain adequate cardiopulmonary perfusion will improve Outcome: Progressing   Problem: Health Behavior/Discharge Planning: Goal: Ability to safely manage health-related needs after discharge will improve Outcome: Progressing

## 2020-11-10 ENCOUNTER — Inpatient Hospital Stay (HOSPITAL_COMMUNITY): Payer: Medicare HMO

## 2020-11-10 DIAGNOSIS — K920 Hematemesis: Secondary | ICD-10-CM | POA: Diagnosis not present

## 2020-11-10 LAB — BASIC METABOLIC PANEL
Anion gap: 3 — ABNORMAL LOW (ref 5–15)
BUN: 15 mg/dL (ref 8–23)
CO2: 28 mmol/L (ref 22–32)
Calcium: 9.5 mg/dL (ref 8.9–10.3)
Chloride: 108 mmol/L (ref 98–111)
Creatinine, Ser: 1.35 mg/dL — ABNORMAL HIGH (ref 0.44–1.00)
GFR, Estimated: 41 mL/min — ABNORMAL LOW (ref >60)
Glucose, Bld: 109 mg/dL — ABNORMAL HIGH (ref 70–99)
Potassium: 4.4 mmol/L (ref 3.5–5.1)
Sodium: 139 mmol/L (ref 135–145)

## 2020-11-10 LAB — GLUCOSE, CAPILLARY
Glucose-Capillary: 113 mg/dL — ABNORMAL HIGH (ref 70–99)
Glucose-Capillary: 202 mg/dL — ABNORMAL HIGH (ref 70–99)
Glucose-Capillary: 238 mg/dL — ABNORMAL HIGH (ref 70–99)
Glucose-Capillary: 322 mg/dL — ABNORMAL HIGH (ref 70–99)
Glucose-Capillary: 327 mg/dL — ABNORMAL HIGH (ref 70–99)

## 2020-11-10 LAB — CBC
HCT: 25.6 % — ABNORMAL LOW (ref 36.0–46.0)
Hemoglobin: 8.8 g/dL — ABNORMAL LOW (ref 12.0–15.0)
MCH: 28.5 pg (ref 26.0–34.0)
MCHC: 34.4 g/dL (ref 30.0–36.0)
MCV: 82.8 fL (ref 80.0–100.0)
Platelets: 146 10*3/uL — ABNORMAL LOW (ref 150–400)
RBC: 3.09 MIL/uL — ABNORMAL LOW (ref 3.87–5.11)
RDW: 14.6 % (ref 11.5–15.5)
WBC: 3.5 10*3/uL — ABNORMAL LOW (ref 4.0–10.5)
nRBC: 0 % (ref 0.0–0.2)

## 2020-11-10 MED ORDER — MORPHINE SULFATE (PF) 2 MG/ML IV SOLN
2.0000 mg | INTRAVENOUS | Status: DC | PRN
  Administered 2020-11-10: 2 mg via INTRAVENOUS
  Filled 2020-11-10: qty 1

## 2020-11-10 MED ORDER — ATORVASTATIN CALCIUM 80 MG PO TABS
80.0000 mg | ORAL_TABLET | Freq: Every evening | ORAL | Status: DC
  Administered 2020-11-10 – 2020-11-11 (×2): 80 mg via ORAL
  Filled 2020-11-10 (×2): qty 1

## 2020-11-10 MED ORDER — INSULIN GLARGINE 100 UNIT/ML ~~LOC~~ SOLN
15.0000 [IU] | Freq: Every day | SUBCUTANEOUS | Status: DC
  Administered 2020-11-10: 15 [IU] via SUBCUTANEOUS
  Filled 2020-11-10 (×2): qty 0.15

## 2020-11-10 MED ORDER — INSULIN ASPART 100 UNIT/ML IJ SOLN
0.0000 [IU] | Freq: Three times a day (TID) | INTRAMUSCULAR | Status: DC
  Administered 2020-11-10: 7 [IU] via SUBCUTANEOUS
  Administered 2020-11-11 (×2): 5 [IU] via SUBCUTANEOUS
  Administered 2020-11-11: 3 [IU] via SUBCUTANEOUS

## 2020-11-10 MED ORDER — HYDRALAZINE HCL 25 MG PO TABS
25.0000 mg | ORAL_TABLET | Freq: Three times a day (TID) | ORAL | Status: DC
  Administered 2020-11-10 – 2020-11-11 (×4): 25 mg via ORAL
  Filled 2020-11-10 (×4): qty 1

## 2020-11-10 MED ORDER — SENNOSIDES-DOCUSATE SODIUM 8.6-50 MG PO TABS
1.0000 | ORAL_TABLET | Freq: Every day | ORAL | Status: DC
  Administered 2020-11-10: 1 via ORAL
  Filled 2020-11-10: qty 1

## 2020-11-10 MED ORDER — LISINOPRIL 20 MG PO TABS: 20.0000 mg | ORAL_TABLET | Freq: Every day | ORAL | Status: DC

## 2020-11-10 MED ORDER — POLYETHYLENE GLYCOL 3350 17 G PO PACK
17.0000 g | PACK | Freq: Every day | ORAL | Status: DC
  Administered 2020-11-10 – 2020-11-11 (×2): 17 g via ORAL
  Filled 2020-11-10 (×2): qty 1

## 2020-11-10 NOTE — Progress Notes (Addendum)
Patient with  Complaints of sharp pain in the lower abdomen across both side.  Pain is constant, feels better with pressure/massage.  Patient states it does not feel like BM pain and has had a BM within the last 24 hours. MD notified.   0615-MD gave verbal order for PRN Morphine IV every four hours.

## 2020-11-10 NOTE — Plan of Care (Signed)
  Problem: Education: Goal: Knowledge of General Education information will improve Description: Including pain rating scale, medication(s)/side effects and non-pharmacologic comfort measures Outcome: Progressing   Problem: Clinical Measurements: Goal: Diagnostic test results will improve Outcome: Progressing Note: Nurse Draw   Problem: Elimination: Goal: Will not experience complications related to bowel motility Outcome: Progressing Note: Small bowel movement after Miralax was given   Problem: Pain Managment: Goal: General experience of comfort will improve Outcome: Progressing Note: PRNs available    Problem: Safety: Goal: Ability to remain free from injury will improve Outcome: Progressing   Problem: Skin Integrity: Goal: Risk for impaired skin integrity will decrease Outcome: Progressing   Problem: Activity: Goal: Ability to tolerate increased activity will improve Outcome: Progressing Note: Walking the halls with walker.

## 2020-11-10 NOTE — Discharge Instructions (Signed)
Information on my medicine - ELIQUIS (apixaban)  This medication education was reviewed with me or my healthcare representative as part of my discharge preparation.  You were taking this medication prior to this hospital admission.   Why was Eliquis prescribed for you? Eliquis was prescribed for you to reduce the risk of a blood clot forming that can cause a stroke if you have a medical condition called atrial fibrillation (a type of irregular heartbeat).  What do You need to know about Eliquis ? Take your Eliquis TWICE DAILY - one tablet in the morning and one tablet in the evening with or without food. If you have difficulty swallowing the tablet whole please discuss with your pharmacist how to take the medication safely.  Take Eliquis exactly as prescribed by your doctor and DO NOT stop taking Eliquis without talking to the doctor who prescribed the medication.  Stopping may increase your risk of developing a stroke.  Refill your prescription before you run out.  After discharge, you should have regular check-up appointments with your healthcare provider that is prescribing your Eliquis.  In the future your dose may need to be changed if your kidney function or weight changes by a significant amount or as you get older.  What do you do if you miss a dose? If you miss a dose, take it as soon as you remember on the same day and resume taking twice daily.  Do not take more than one dose of ELIQUIS at the same time to make up a missed dose.  Important Safety Information A possible side effect of Eliquis is bleeding. You should call your healthcare provider right away if you experience any of the following: Bleeding from an injury or your nose that does not stop. Unusual colored urine (red or dark brown) or unusual colored stools (red or black). Unusual bruising for unknown reasons. A serious fall or if you hit your head (even if there is no bleeding).  Some medicines may interact with  Eliquis and might increase your risk of bleeding or clotting while on Eliquis. To help avoid this, consult your healthcare provider or pharmacist prior to using any new prescription or non-prescription medications, including herbals, vitamins, non-steroidal anti-inflammatory drugs (NSAIDs) and supplements.  This website has more information on Eliquis (apixaban): http://www.eliquis.com/eliquis/home  

## 2020-11-10 NOTE — Progress Notes (Signed)
PROGRESS NOTE    Cassandra Adams  CWU:[id number] DOB: April 17, 1946 DOA: 11/07/2020 PCP: Sharilyn Sites, MD     Brief Narrative:  Cassandra Adams is a 75 year old female with history of hypertension, TIA, atrial fibrillation on Eliquis, CAD, diabetes mellitus type 2, sickle cell trait, CKD stage III presented with chest pain, nausea and vomiting.  She was seen at [facility] after having a suspected syncopal episode sustaining abrasion to her forehead and nose with epistaxis.  She had negative CT scan of the head and cervical spine.  Echocardiogram showed EF of 55 to 60% with indeterminate diastolic parameters.  Patient was discharged home.  Later on patient developed vomiting.  Emesis was reportedly coffee-ground in appearance.  Patient is on Eliquis and aspirin at home.  Denies use of NSAIDs, abdominal pain, dysuria or dark stools. Gastroenterology was consulted for EGD.  New events last 24 hours / Subjective: Patient reports severe right lower quadrant abdominal pain, stabbing and sharp in nature, started this morning.  No nausea or vomiting.  States that she had a bowel movement yesterday, but has not been passing gas.  Morphine has helped.  Assessment & Plan:   Principal Problem:   Hematemesis with nausea Active Problems:   PAF (paroxysmal atrial fibrillation) (HCC)   Acute kidney injury superimposed on chronic kidney disease ([place])   Uncontrolled type 2 diabetes mellitus with hyperglycemia (HCC)   Acute blood loss anemia   Transient hypotension   Atypical chest pain   Elevated troponin   Gastric nodule   Acute blood loss anemia -Eliquis was on hold, patient was given Kcentra and started on Protonix drip. -GI consulted and patient underwent EGD 5/16 which revealed erosive gastropathy with no stigmata of bleeding.  Recommended Protonix 40 mg twice daily for 12 weeks and to resume Eliquis.  Follow-up outpatient with [facility].  Constipation -Patient with complaints  of abdominal pain, abdominal x-ray revealed moderate to large stool burden -Start MiraLAX, senna  Atypical chest pain -Resolved  Paroxysmal atrial fibrillation -Continue Eliquis, Coreg  AKI on CKD stage IIIb -Improved  Diabetes mellitus type 2 -Hemoglobin A1c 8.1 -Continue sliding scale insulin    DVT prophylaxis:  SCDs Start: 11/07/20 1620 apixaban (ELIQUIS) tablet 5 mg  Code Status:     Code Status Orders  (From admission, onward)         Start     Ordered   11/07/20 1621  Full code  Continuous        11/07/20 1639        Code Status History    Date Active Date Inactive Code Status Order ID Comments User Context   11/05/2020 1935 11/06/2020 2120 Full Code [id number]  Reubin Milan, MD ED   11/05/2020 1935 11/05/2020 1935 Full Code [id number]  Reubin Milan, MD ED   05/04/2019 2318 05/12/2019 1745 Full Code [id number]  Lynetta Mare, MD ED   02/26/2019 2349 02/27/2019 2255 Full Code [id number]  Rise Patience, MD ED   02/05/2017 1436 02/07/2017 1429 Full Code [id number]  Ulyses Amor, PA-C Inpatient   01/19/2017 1457 01/21/2017 1636 Full Code [id number]  Kathie Dike, MD Inpatient   01/05/2014 1628 01/06/2014 1524 Full Code [id number]  [name], MD Inpatient   01/04/2014 0206 01/05/2014 1628 Full Code [id number]  Lamar Sprinkles, MD Inpatient   09/15/2012 1605 09/16/2012 1402 Full Code [id number]  [name], MD Inpatient   Advance Care Planning Activity     Family Communication: None  at bedside  Disposition Plan:  Status is: Inpatient  Remains inpatient appropriate because:Inpatient level of care appropriate due to severity of illness   Dispo: The patient is from: Home              Anticipated d/c is to: Home              Patient currently is not medically stable to d/c.   Difficult to place patient No   Antimicrobials:  Anti-infectives (From admission, onward)   None        Objective: Vitals:   11/09/20 0511 11/09/20 2048  11/10/20 0453 11/10/20 1144  BP: (!) 141/66 (!) 152/60 (!) 151/79 (!) 147/69  Pulse: 78 78 73 76  Resp: 17 20 18 20   Temp: 97.9 F (36.6 C) 98.4 F (36.9 C) 98.2 F (36.8 C) 97.8 F (36.6 C)  TempSrc:  Oral Oral Oral  SpO2: 100% 100% 100% 100%  Weight:   80.6 kg     Intake/Output Summary (Last 24 hours) at 11/10/2020 1303 Last data filed at 11/10/2020 1100 Gross per 24 hour  Intake 1095.31 ml  Output 2625 ml  Net -1529.69 ml   Filed Weights   11/08/20 0500 11/09/20 0102 11/10/20 0453  Weight: 82.1 kg 82 kg 80.6 kg    Examination:  General exam: Appears calm and comfortable  Respiratory system: Clear to auscultation. Respiratory effort normal. No respiratory distress. No conversational dyspnea.  Cardiovascular system: S1 & S2 heard, irreg rhythm. No murmurs. No pedal edema. Gastrointestinal system: Abdomen is nondistended, soft and mildly TTP RLQ. Normal bowel sounds heard. Central nervous system: Alert and oriented. No focal neurological deficits. Speech clear.  Extremities: Symmetric in appearance  Skin: No rashes, lesions or ulcers on exposed skin  Psychiatry: Judgement and insight appear normal. Mood & affect appropriate.   Data Reviewed: I have personally reviewed following labs and imaging studies  CBC: Recent Labs  Lab 11/05/20 1550 11/07/20 1132 11/07/20 1727 11/08/20 0441 11/08/20 1023 11/08/20 1634 11/09/20 0348 11/10/20 0500  WBC 7.0 4.7  --   --   --   --  4.0 3.5*  NEUTROABS 5.6  --   --   --   --   --   --   --   HGB 13.8 9.9*   < > 8.5* 8.6* 8.5* 8.2* 8.8*  HCT 40.9 28.6*   < > 24.4* 25.0* 24.3* 23.7* 25.6*  MCV 84.3 82.2  --   --   --   --  82.6 82.8  PLT 203 178  --   --   --   --  129* 146*   < > = values in this interval not displayed.   Basic Metabolic Panel: Recent Labs  Lab 11/05/20 1550 11/05/20 1959 11/07/20 1132 11/08/20 0441 11/09/20 0348 11/10/20 0500  NA 139  --  139 140 139 139  K 4.2  --  4.4 3.9 5.1 4.4  CL 106  --  109  111 112* 108  CO2 26  --  23 27 27 28   GLUCOSE 111*  --  161* 58* 177* 109*  BUN 33*  --  51* 37* 25* 15  CREATININE 1.15*  --  1.64* 1.41* 1.64* 1.35*  CALCIUM 9.7  --  9.9 9.0 9.1 9.5  MG  --  1.9  --   --   --   --   PHOS  --  3.5  --   --   --   --  GFR: Estimated Creatinine Clearance: 37.9 mL/min (A) (by C-G formula based on SCr of 1.35 mg/dL (H)). Liver Function Tests: Recent Labs  Lab 11/05/20 1550  AST 31  ALT 23  ALKPHOS 83  BILITOT 0.6  PROT 7.2  ALBUMIN 3.6   No results for input(s): LIPASE, AMYLASE in the last 168 hours. No results for input(s): AMMONIA in the last 168 hours. Coagulation Profile: Recent Labs  Lab 11/05/20 1959 11/06/20 0512  INR 1.1 1.4*   Cardiac Enzymes: Recent Labs  Lab 11/05/20 1550 11/07/20 1727  CKTOTAL 223 117   BNP (last 3 results) No results for input(s): PROBNP in the last 8760 hours. HbA1C: No results for input(s): HGBA1C in the last 72 hours. CBG: Recent Labs  Lab 11/09/20 1205 11/09/20 1729 11/10/20 0017 11/10/20 0617 11/10/20 1147  GLUCAP 220* 265* 202* 113* 238*   Lipid Profile: No results for input(s): CHOL, HDL, LDLCALC, TRIG, CHOLHDL, LDLDIRECT in the last 72 hours. Thyroid Function Tests: No results for input(s): TSH, T4TOTAL, FREET4, T3FREE, THYROIDAB in the last 72 hours. Anemia Panel: No results for input(s): VITAMINB12, FOLATE, FERRITIN, TIBC, IRON, RETICCTPCT in the last 72 hours. Sepsis Labs: No results for input(s): PROCALCITON, LATICACIDVEN in the last 168 hours.  Recent Results (from the past 240 hour(s))  Resp Panel by RT-PCR (Flu A&B, Covid) Nasopharyngeal Swab     Status: None   Collection Time: 11/05/20  7:22 PM   Specimen: Nasopharyngeal Swab; Nasopharyngeal(NP) swabs in vial transport medium  Result Value Ref Range Status   SARS Coronavirus 2 by RT PCR NEGATIVE NEGATIVE Final    Comment: (NOTE) SARS-CoV-2 target nucleic acids are NOT DETECTED.  The SARS-CoV-2 RNA is generally  detectable in upper respiratory specimens during the acute phase of infection. The lowest concentration of SARS-CoV-2 viral copies this assay can detect is 138 copies/mL. A negative result does not preclude SARS-Cov-2 infection and should not be used as the sole basis for treatment or other patient management decisions. A negative result may occur with  improper specimen collection/handling, submission of specimen other than nasopharyngeal swab, presence of viral mutation(s) within the areas targeted by this assay, and inadequate number of viral copies(<138 copies/mL). A negative result must be combined with clinical observations, patient history, and epidemiological information. The expected result is Negative.  Fact Sheet for Patients:  EntrepreneurPulse.com.au  Fact Sheet for Healthcare Providers:  IncredibleEmployment.be  This test is no t yet approved or cleared by the [place] FDA and  has been authorized for detection and/or diagnosis of SARS-CoV-2 by FDA under an Emergency Use Authorization (EUA). This EUA will remain  in effect (meaning this test can be used) for the duration of the COVID-19 declaration under Section 564(b)(1) of the Act, 21 U.S.C.section 360bbb-3(b)(1), unless the authorization is terminated  or revoked sooner.       Influenza A by PCR NEGATIVE NEGATIVE Final   Influenza B by PCR NEGATIVE NEGATIVE Final    Comment: (NOTE) The Xpert Xpress SARS-CoV-2/FLU/RSV plus assay is intended as an aid in the diagnosis of influenza from Nasopharyngeal swab specimens and should not be used as a sole basis for treatment. Nasal washings and aspirates are unacceptable for Xpert Xpress SARS-CoV-2/FLU/RSV testing.  Fact Sheet for Patients: EntrepreneurPulse.com.au  Fact Sheet for Healthcare Providers: IncredibleEmployment.be  This test is not yet approved or cleared by the [place] FDA  and has been authorized for detection and/or diagnosis of SARS-CoV-2 by FDA under an Emergency Use Authorization (EUA). This EUA will remain  in effect (meaning this test can be used) for the duration of the COVID-19 declaration under Section 564(b)(1) of the Act, 21 U.S.C. section 360bbb-3(b)(1), unless the authorization is terminated or revoked.  Performed at [facility], [address], [place], [place] [address]   MRSA PCR Screening     Status: None   Collection Time: 11/05/20  9:19 PM   Specimen: Nasopharyngeal  Result Value Ref Range Status   MRSA by PCR NEGATIVE NEGATIVE Final    Comment:        The GeneXpert MRSA Assay (FDA approved for NASAL specimens only), is one component of a comprehensive MRSA colonization surveillance program. It is not intended to diagnose MRSA infection nor to guide or monitor treatment for MRSA infections. Performed at [facility], [address], [place], [place] [address]       Radiology Studies: DG Abd 1 View  Result Date: 11/10/2020 CLINICAL DATA:  Abdominal pain. EXAM: ABDOMEN - 1 VIEW COMPARISON:  Abdominal radiographs 01/19/2017.  CT 09/15/2012. FINDINGS: Two supine views are submitted. The bowel gas pattern appears nonobstructive. There is no supine evidence of free intraperitoneal air or bowel wall thickening. A moderate to large amount of stool is present throughout the colon. There are calcified uterine fibroids without suspicious abdominal calcifications. Multilevel lumbar spondylosis noted. IMPRESSION: Moderate to large amount of stool throughout the colon suggesting constipation. No evidence of acute abdominal process. Electronically Signed   By: Richardean Sale M.D.   On: 11/10/2020 12:52      Scheduled Meds: . apixaban  5 mg Oral BID  . carvedilol  3.125 mg Oral BID WC  . insulin aspart  0-9 Units Subcutaneous Q6H  . pantoprazole  40 mg Oral BID  . polyethylene glycol  17 g Oral Daily  . senna-docusate  1 tablet Oral QHS   . sodium chloride flush  10-40 mL Intracatheter Q12H   Continuous Infusions:   LOS: 3 days      Time spent: 25 minutes   Dessa Phi, DO [facility] 11/10/2020, 1:03 PM   Available via Epic secure chat 7am-7pm After these hours, please refer to coverage provider listed on [email or web address]

## 2020-11-11 DIAGNOSIS — K920 Hematemesis: Secondary | ICD-10-CM | POA: Diagnosis not present

## 2020-11-11 LAB — CBC
HCT: 25.7 % — ABNORMAL LOW (ref 36.0–46.0)
Hemoglobin: 9 g/dL — ABNORMAL LOW (ref 12.0–15.0)
MCH: 28.7 pg (ref 26.0–34.0)
MCHC: 35 g/dL (ref 30.0–36.0)
MCV: 81.8 fL (ref 80.0–100.0)
Platelets: 160 10*3/uL (ref 150–400)
RBC: 3.14 MIL/uL — ABNORMAL LOW (ref 3.87–5.11)
RDW: 14.5 % (ref 11.5–15.5)
WBC: 3.5 10*3/uL — ABNORMAL LOW (ref 4.0–10.5)
nRBC: 0 % (ref 0.0–0.2)

## 2020-11-11 LAB — BASIC METABOLIC PANEL
Anion gap: <3 — ABNORMAL LOW (ref 5–15)
BUN: 19 mg/dL (ref 8–23)
CO2: 28 mmol/L (ref 22–32)
Calcium: 9.2 mg/dL (ref 8.9–10.3)
Chloride: 107 mmol/L (ref 98–111)
Creatinine, Ser: 1.33 mg/dL — ABNORMAL HIGH (ref 0.44–1.00)
GFR, Estimated: 41 mL/min — ABNORMAL LOW (ref >60)
Glucose, Bld: 245 mg/dL — ABNORMAL HIGH (ref 70–99)
Potassium: 4.7 mmol/L (ref 3.5–5.1)
Sodium: 137 mmol/L (ref 135–145)

## 2020-11-11 LAB — GLUCOSE, CAPILLARY
Glucose-Capillary: 259 mg/dL — ABNORMAL HIGH (ref 70–99)
Glucose-Capillary: 241 mg/dL — ABNORMAL HIGH (ref 70–99)
Glucose-Capillary: 253 mg/dL — ABNORMAL HIGH (ref 70–99)

## 2020-11-11 MED ORDER — PANTOPRAZOLE SODIUM 40 MG PO TBEC: 40.0000 mg | DELAYED_RELEASE_TABLET | Freq: Two times a day (BID) | ORAL | 0 refills | Status: DC

## 2020-11-11 MED ORDER — SENNOSIDES-DOCUSATE SODIUM 8.6-50 MG PO TABS: 1.0000 | ORAL_TABLET | Freq: Every evening | ORAL | 0 refills | Status: DC | PRN

## 2020-11-11 MED ORDER — POLYETHYLENE GLYCOL 3350 17 G PO PACK: 17.0000 g | PACK | Freq: Every day | ORAL | 0 refills | Status: AC

## 2020-11-11 NOTE — Discharge Summary (Signed)
Physician Discharge Summary  Cassandra Adams XBL:[id number] DOB: 12-17-1945 DOA: 11/07/2020  PCP: Sharilyn Sites, MD  Admit date: 11/07/2020 Discharge date: 11/11/2020  Admitted From: Home Disposition:  Home  Recommendations for Outpatient Follow-up:  1. Follow up with PCP in 1 week 2. Follow up with GI   Discharge Condition: Stable CODE STATUS: Full  Diet recommendation:  Diet Orders (From admission, onward)    Start     Ordered   11/11/20 0000  Diet - low sodium heart healthy        11/11/20 0925   11/10/20 2315  Diet heart healthy/carb modified Room service appropriate? Yes; Fluid consistency: Thin  Diet effective now       Question Answer Comment  Diet-HS Snack? Nothing   Room service appropriate? Yes   Fluid consistency: Thin      11/10/20 2315         Brief/Interim Summary: Cassandra Adams is a 75 year old female with history of hypertension, TIA, atrial fibrillation on Eliquis, CAD, diabetes mellitus type 2, sickle cell trait, CKD stage III presented with chest pain, nausea and vomiting. She was seen at [facility] after having a suspected syncopal episode sustaining abrasion to her forehead and nose with epistaxis. She had negative CT scan of the head and cervical spine. Echocardiogram showed EF of 55 to 60% with indeterminate diastolic parameters. Patient was discharged home. Later on patient developed vomiting. Emesis was reportedly coffee-ground in appearance. Patient is on Eliquis and aspirin at home. Denies use of NSAIDs, abdominal pain, dysuria or dark stools. Gastroenterology was consulted for EGD. She underwent EGD 5/16 which revealed erosive gastropathy with no stigmata of bleeding.  Recommended Protonix 40 mg twice daily for 12 weeks and to resume Eliquis.  Follow-up outpatient with [facility]. She had some abdominal pain secondary to constipation which improved with bowel regimen. She was feeling well, brushing her teeth at the sink, ambulating  independently, and felt ready to discharge home.   Discharge Diagnoses:  Principal Problem:   Hematemesis with nausea Active Problems:   PAF (paroxysmal atrial fibrillation) (HCC)   Acute kidney injury superimposed on chronic kidney disease ([place])   Uncontrolled type 2 diabetes mellitus with hyperglycemia (HCC)   Acute blood loss anemia   Transient hypotension   Atypical chest pain   Elevated troponin   Gastric nodule   Acute blood loss anemia -Eliquis was on hold, patient was given Kcentra and started on Protonix drip. -GI consulted and patient underwent EGD 5/16 which revealed erosive gastropathy with no stigmata of bleeding.  Recommended Protonix 40 mg twice daily for 12 weeks and to resume Eliquis.  Follow-up outpatient with [facility].  Constipation -Patient with complaints of abdominal pain, abdominal x-ray revealed moderate to large stool burden -Improved on MiraLAX, senna  Atypical chest pain -Resolved  Paroxysmal atrial fibrillation -Continue Eliquis, Coreg  AKI on CKD stage IIIb -Improved. Resume lasix and lisinopril   HTN -Continue coreg, lasix, hydralazine, lisinopril   Diabetes mellitus type 2 -Hemoglobin A1c 8.1 -Continue sliding scale insulin   Discharge Instructions  Discharge Instructions    (HEART FAILURE PATIENTS) Call MD:  Anytime you have any of the following symptoms: 1) 3 pound weight gain in 24 hours or 5 pounds in 1 week 2) shortness of breath, with or without a dry hacking cough 3) swelling in the hands, feet or stomach 4) if you have to sleep on extra pillows at night in order to breathe.   Complete by: As directed  Call MD for:  difficulty breathing, headache or visual disturbances   Complete by: As directed    Call MD for:  extreme fatigue   Complete by: As directed    Call MD for:  persistant dizziness or light-headedness   Complete by: As directed    Call MD for:  persistant nausea and vomiting   Complete by: As directed     Call MD for:  severe uncontrolled pain   Complete by: As directed    Call MD for:  temperature >100.4   Complete by: As directed    Diet - low sodium heart healthy   Complete by: As directed    Discharge instructions   Complete by: As directed    You were cared for by a hospitalist during your hospital stay. If you have any questions about your discharge medications or the care you received while you were in the hospital after you are discharged, you can call the unit and ask to speak with the hospitalist on call if the hospitalist that took care of you is not available. Once you are discharged, your primary care physician will handle any further medical issues. Please note that NO REFILLS for any discharge medications will be authorized once you are discharged, as it is imperative that you return to your primary care physician (or establish a relationship with a primary care physician if you do not have one) for your aftercare needs so that they can reassess your need for medications and monitor your lab values.   Increase activity slowly   Complete by: As directed      Allergies as of 11/11/2020      Reactions   Penicillins Hives   Has patient had a PCN reaction causing immediate rash, facial/tongue/throat swelling, SOB or lightheadedness with hypotension: no Has patient had a PCN reaction causing severe rash involving mucus membranes or skin necrosis: No  Has patient had a PCN reaction that required hospitalization: no Has patient had a PCN reaction occurring within the last 10 years: no If all of the above answers are "NO", then may proceed with Cephalosporin use.      Medication List    TAKE these medications   aspirin EC 81 MG tablet Take 1 tablet (81 mg total) by mouth daily.   atorvastatin 80 MG tablet Commonly known as: LIPITOR Take 1 tablet (80 mg total) by mouth every evening.   B-D ULTRAFINE III SHORT PEN 31G X 8 MM Misc Generic drug: Insulin Pen  Needle SMARTSIG:Injection Daily   Basaglar KwikPen 100 UNIT/ML Inject 32 Units into the skin at bedtime.   BD Insulin Syringe U/F 31G X 5/16" 0.5 ML Misc Generic drug: Insulin Syringe-Needle U-100 SMARTSIG:Injection 4 Times Daily   carvedilol 3.125 MG tablet Commonly known as: COREG Take 3.125 mg by mouth 2 (two) times daily with a meal.   colesevelam 625 MG tablet Commonly known as: WELCHOL Take 1,875 mg by mouth 2 (two) times daily with a meal. 1875mg  at noon and 1875mg  at night   Eliquis 5 MG Tabs tablet Generic drug: apixaban Take 1 tablet (5 mg total) by mouth 2 (two) times daily.   furosemide 40 MG tablet Commonly known as: LASIX Take 40 mg by mouth daily.   glipiZIDE 2.5 MG 24 hr tablet Commonly known as: GLUCOTROL XL Take 1 tablet (2.5 mg total) by mouth daily with breakfast.   hydrALAZINE 25 MG tablet Commonly known as: APRESOLINE Take 25 mg by mouth 3 (three) times daily.  lisinopril 20 MG tablet Commonly known as: ZESTRIL Take 1 tablet (20 mg total) by mouth daily.   nitroGLYCERIN 0.4 MG SL tablet Commonly known as: NITROSTAT Place 1 tablet (0.4 mg total) under the tongue every 5 (five) minutes as needed for chest pain (up to 3 doses).   NovoLOG 100 UNIT/ML injection Generic drug: insulin aspart Inject 6-9 Units into the skin 3 (three) times daily with meals.   OneTouch Ultra test strip Generic drug: glucose blood   pantoprazole 40 MG tablet Commonly known as: PROTONIX Take 1 tablet (40 mg total) by mouth 2 (two) times daily.   polyethylene glycol 17 g packet Commonly known as: MIRALAX / GLYCOLAX Take 17 g by mouth daily. Start taking on: Nov 12, 2020   senna-docusate 8.6-50 MG tablet Commonly known as: Senokot-S Take 1 tablet by mouth at bedtime as needed for mild constipation.   Vitamin D (Cholecalciferol) 25 MCG (1000 UT) Tabs Take 2,000 Units by mouth See admin instructions. Every two days       Follow-up Information    Sharilyn Sites, MD. Schedule an appointment as soon as possible for a visit in 1 week(s).   Specialty: Family Medicine Contact information: [address] [place] [address] [phone]        Thornton Park, MD. Schedule an appointment as soon as possible for a visit in 1 month(s).   Specialty: Gastroenterology Contact information: [place] [address] [phone]              Allergies  Allergen Reactions  . Penicillins Hives    Has patient had a PCN reaction causing immediate rash, facial/tongue/throat swelling, SOB or lightheadedness with hypotension: no Has patient had a PCN reaction causing severe rash involving mucus membranes or skin necrosis: No  Has patient had a PCN reaction that required hospitalization: no Has patient had a PCN reaction occurring within the last 10 years: no If all of the above answers are "NO", then may proceed with Cephalosporin use.     Consultations:  GI   Procedures/Studies: DG Chest 2 View  Result Date: 11/07/2020 CLINICAL DATA:  Chest pain and emesis. EXAM: CHEST - 2 VIEW COMPARISON:  11/05/2020 FINDINGS: Normal heart size. Aortic atherosclerosis. Asymmetric elevation of right hemidiaphragm is unchanged. The visualized osseous structures are unremarkable. IMPRESSION: No active cardiopulmonary abnormalities. Electronically Signed   By: Kerby Moors M.D.   On: 11/07/2020 12:28   DG Chest 2 View  Result Date: 11/05/2020 CLINICAL DATA:  75 year old who fell earlier today. Initial encounter. Current history of hypertension and diabetes. EXAM: CHEST - 2 VIEW COMPARISON:  04/12/2019 and earlier. FINDINGS: AP ERECT and LATERAL images were obtained. Suboptimal inspiration accounts for crowded bronchovascular markings, especially in the bases, and accentuates the cardiac silhouette. Taking this into account, cardiac silhouette mildly enlarged and stable. Thoracic aorta atherosclerotic, unchanged. Hilar and mediastinal  contours otherwise unremarkable. Lungs clear. Bronchovascular markings normal. Pulmonary vascularity normal. No visible pleural effusions. No pneumothorax. Stable chronic elevation of the RIGHT hemidiaphragm. Degenerative changes involving the lower thoracic and upper lumbar spine. IMPRESSION: Suboptimal inspiration. Stable mild cardiomegaly. No acute cardiopulmonary disease. Electronically Signed   By: Evangeline Dakin M.D.   On: 11/05/2020 16:59   DG Abd 1 View  Result Date: 11/10/2020 CLINICAL DATA:  Abdominal pain. EXAM: ABDOMEN - 1 VIEW COMPARISON:  Abdominal radiographs 01/19/2017.  CT 09/15/2012. FINDINGS: Two supine views are submitted. The bowel gas pattern appears nonobstructive. There is no supine evidence of free  intraperitoneal air or bowel wall thickening. A moderate to large amount of stool is present throughout the colon. There are calcified uterine fibroids without suspicious abdominal calcifications. Multilevel lumbar spondylosis noted. IMPRESSION: Moderate to large amount of stool throughout the colon suggesting constipation. No evidence of acute abdominal process. Electronically Signed   By: Richardean Sale M.D.   On: 11/10/2020 12:52   CT Head Wo Contrast  Result Date: 11/05/2020 CLINICAL DATA:  Fall. EXAM: CT HEAD WITHOUT CONTRAST CT MAXILLOFACIAL WITHOUT CONTRAST CT CERVICAL SPINE WITHOUT CONTRAST TECHNIQUE: Multidetector CT imaging of the head, cervical spine, and maxillofacial structures were performed using the standard protocol without intravenous contrast. Multiplanar CT image reconstructions of the cervical spine and maxillofacial structures were also generated. COMPARISON:  CT head dated May 11, 2019. MRI head and cervical spine dated May 05, 2019. CT maxillofacial dated April 12, 2019. FINDINGS: CT HEAD FINDINGS Brain: No evidence of acute infarction, hemorrhage, hydrocephalus, extra-axial collection or mass lesion/mass effect. Stable mild atrophy. Vascular:  Atherosclerotic vascular calcification of the carotid siphons. No hyperdense vessel. Skull: Normal. Negative for fracture or focal lesion. Other: None. CT MAXILLOFACIAL FINDINGS Osseous: No fracture or mandibular dislocation. No destructive process. Orbits: Negative. No traumatic or inflammatory finding. Sinuses: Chronic right maxillary sinusitis and mucoperiosteal thickening. New small air-fluid levels in the left maxillary and right sphenoid sinuses. The mastoid air cells are clear. Soft tissues: Small nasal soft tissue laceration. CT CERVICAL SPINE FINDINGS Alignment: No traumatic malalignment. Unchanged focal reversal of the normal cervical lordosis at C5-C6. Skull base and vertebrae: No acute fracture. No primary bone lesion or focal pathologic process. C5-C6 ankylosis. Soft tissues and spinal canal: No prevertebral fluid or swelling. No visible canal hematoma. Disc levels:  Progressive now severe disc height loss at C6-C7. Upper chest: Negative. Other: None. IMPRESSION: CT head: 1. No acute intracranial abnormality. CT maxillofacial: 1. No acute maxillofacial fracture. Small nasal soft tissue laceration. 2. Chronic right maxillary sinusitis. New small air-fluid levels in the left maxillary and right sphenoid sinuses. Correlate for acute sinusitis. CT cervical spine: 1. No acute cervical spine fracture or traumatic listhesis. 2. Progressive now severe degenerative disc disease at C6-C7. Electronically Signed   By: Titus Dubin M.D.   On: 11/05/2020 17:41   CT Chest Wo Contrast  Result Date: 11/07/2020 CLINICAL DATA:  Rib fracture suspected EXAM: CT CHEST WITHOUT CONTRAST TECHNIQUE: Multidetector CT imaging of the chest was performed following the standard protocol without IV contrast. COMPARISON:  None. FINDINGS: Cardiovascular: Aortic atherosclerosis. Cardiomegaly. Three-vessel coronary artery calcifications. No pericardial effusion. Enlargement of the main pulmonary artery measuring 3.8 cm in caliber.  Mediastinum/Nodes: No enlarged mediastinal, hilar, or axillary lymph nodes. Thyroid gland, trachea, and esophagus demonstrate no significant findings. Lungs/Pleura: Lungs are clear. No pleural effusion or pneumothorax. Upper Abdomen: No acute abnormality. Musculoskeletal: No chest wall mass or suspicious bone lesions identified. IMPRESSION: 1. No displaced rib fracture or other noncontrast CT evidence of acute traumatic injury to the chest. 2. Cardiomegaly and coronary artery disease. 3. Enlargement of the main pulmonary artery, as can be seen in pulmonary hypertension. Aortic Atherosclerosis (ICD10-I70.0). Electronically Signed   By: Eddie Candle M.D.   On: 11/07/2020 15:52   CT Cervical Spine Wo Contrast  Result Date: 11/05/2020 CLINICAL DATA:  Fall. EXAM: CT HEAD WITHOUT CONTRAST CT MAXILLOFACIAL WITHOUT CONTRAST CT CERVICAL SPINE WITHOUT CONTRAST TECHNIQUE: Multidetector CT imaging of the head, cervical spine, and maxillofacial structures were performed using the standard protocol without intravenous contrast. Multiplanar CT image reconstructions  of the cervical spine and maxillofacial structures were also generated. COMPARISON:  CT head dated May 11, 2019. MRI head and cervical spine dated May 05, 2019. CT maxillofacial dated April 12, 2019. FINDINGS: CT HEAD FINDINGS Brain: No evidence of acute infarction, hemorrhage, hydrocephalus, extra-axial collection or mass lesion/mass effect. Stable mild atrophy. Vascular: Atherosclerotic vascular calcification of the carotid siphons. No hyperdense vessel. Skull: Normal. Negative for fracture or focal lesion. Other: None. CT MAXILLOFACIAL FINDINGS Osseous: No fracture or mandibular dislocation. No destructive process. Orbits: Negative. No traumatic or inflammatory finding. Sinuses: Chronic right maxillary sinusitis and mucoperiosteal thickening. New small air-fluid levels in the left maxillary and right sphenoid sinuses. The mastoid air cells are clear.  Soft tissues: Small nasal soft tissue laceration. CT CERVICAL SPINE FINDINGS Alignment: No traumatic malalignment. Unchanged focal reversal of the normal cervical lordosis at C5-C6. Skull base and vertebrae: No acute fracture. No primary bone lesion or focal pathologic process. C5-C6 ankylosis. Soft tissues and spinal canal: No prevertebral fluid or swelling. No visible canal hematoma. Disc levels:  Progressive now severe disc height loss at C6-C7. Upper chest: Negative. Other: None. IMPRESSION: CT head: 1. No acute intracranial abnormality. CT maxillofacial: 1. No acute maxillofacial fracture. Small nasal soft tissue laceration. 2. Chronic right maxillary sinusitis. New small air-fluid levels in the left maxillary and right sphenoid sinuses. Correlate for acute sinusitis. CT cervical spine: 1. No acute cervical spine fracture or traumatic listhesis. 2. Progressive now severe degenerative disc disease at C6-C7. Electronically Signed   By: Titus Dubin M.D.   On: 11/05/2020 17:41   ECHOCARDIOGRAM COMPLETE  Result Date: 11/06/2020    ECHOCARDIOGRAM REPORT   Patient Name:   Cassandra Adams [facility] Date of Exam: 11/06/2020 Medical Rec #:  [id number]            Height:       66.0 in Accession #:    [id number]           Weight:       173.1 lb Date of Birth:  1945/09/29            BSA:          1.881 m Patient Age:    73 years             BP:           138/56 mmHg Patient Gender: F                    HR:           97 bpm. Exam Location:  [facility] Procedure: 2D Echo, Cardiac Doppler and Color Doppler Indications:    Syncope R55  History:        Patient has prior history of Echocardiogram examinations, most                 recent 05/05/2019. CAD and Previous Myocardial Infarction,                 Arrythmias:Atrial Fibrillation; Risk Factors:Hypertension,                 Diabetes and Dyslipidemia.  Sonographer:    Alvino Chapel RCS Referring Phys: [id number] [place]  1. Left ventricular ejection  fraction, by estimation, is 55 to 60%. The left ventricle has normal function. The left ventricle demonstrates regional wall motion abnormalities (see scoring diagram/findings for description). There is moderate asymmetric left ventricular hypertrophy of the basal and septal segments. Left ventricular  diastolic parameters are indeterminate.  2. Right ventricular systolic function is normal. The right ventricular size is normal. There is normal pulmonary artery systolic pressure. The estimated right ventricular systolic pressure is 63.7 mmHg.  3. Left atrial size was mild to moderately dilated.  4. Possible small to moderate pericardial fluid collection at posterior base of LV. No obvious hemodynamic significance.  5. The mitral valve is abnormal, mildly calcified with restricted posterior leaflet motion. Mild mitral valve regurgitation.  6. The aortic valve is tricuspid. Aortic valve regurgitation is not visualized. Mild to moderate aortic valve sclerosis/calcification is present, without any evidence of aortic stenosis.  7. The inferior vena cava is normal in size with greater than 50% respiratory variability, suggesting right atrial pressure of 3 mmHg. FINDINGS  Left Ventricle: Left ventricular ejection fraction, by estimation, is 55 to 60%. The left ventricle has normal function. The left ventricle demonstrates regional wall motion abnormalities. The left ventricular internal cavity size was normal in size. There is moderate asymmetric left ventricular hypertrophy of the basal and septal segments. Left ventricular diastolic parameters are indeterminate.  LV Wall Scoring: The basal inferior segment is akinetic. The entire anterior wall, entire lateral wall, entire septum, entire apex, and mid and distal inferior wall are normal. Right Ventricle: The right ventricular size is normal. No increase in right ventricular wall thickness. Right ventricular systolic function is normal. There is normal pulmonary artery  systolic pressure. The tricuspid regurgitant velocity is 2.45 m/s, and  with an assumed right atrial pressure of 3 mmHg, the estimated right ventricular systolic pressure is 85.8 mmHg. Left Atrium: Left atrial size was mild to moderately dilated. Right Atrium: Right atrial size was normal in size. Pericardium: Possible small to moderate pericardial fluid collection at posterior base of LV. No obvious hemodynamic significance. The pericardium was not well visualized. Mitral Valve: The mitral valve is abnormal. There is mild calcification of the mitral valve leaflet(s). Mild mitral valve regurgitation. Tricuspid Valve: The tricuspid valve is grossly normal. Tricuspid valve regurgitation is mild. Aortic Valve: The aortic valve is tricuspid. There is mild aortic valve annular calcification. Aortic valve regurgitation is not visualized. Mild to moderate aortic valve sclerosis/calcification is present, without any evidence of aortic stenosis. Pulmonic Valve: The pulmonic valve was grossly normal. Pulmonic valve regurgitation is trivial. Aorta: The aortic root is normal in size and structure. Venous: The inferior vena cava is normal in size with greater than 50% respiratory variability, suggesting right atrial pressure of 3 mmHg. IAS/Shunts: No atrial level shunt detected by color flow Doppler.  LEFT VENTRICLE PLAX 2D LVIDd:         4.40 cm  Diastology LVIDs:         3.40 cm  LV e' medial:    6.20 cm/s LV PW:         1.20 cm  LV E/e' medial:  15.7 LV IVS:        1.80 cm  LV e' lateral:   9.79 cm/s LVOT diam:     1.90 cm  LV E/e' lateral: 9.9 LV SV:         50 LV SV Index:   27 LVOT Area:     2.84 cm  RIGHT VENTRICLE RV S prime:     8.22 cm/s TAPSE (M-mode): 1.3 cm LEFT ATRIUM             Index       RIGHT ATRIUM           Index LA  diam:        4.10 cm 2.18 cm/m  RA Area:     15.00 cm LA Vol (A2C):   80.5 ml 42.81 ml/m RA Volume:   36.60 ml  19.46 ml/m LA Vol (A4C):   75.3 ml 40.04 ml/m LA Biplane Vol: 77.8 ml 41.37  ml/m  AORTIC VALVE LVOT Vmax:   85.60 cm/s LVOT Vmean:  62.500 cm/s LVOT VTI:    0.178 m  AORTA Ao Root diam: 3.40 cm MITRAL VALVE               TRICUSPID VALVE MV Area (PHT): 3.60 cm    TR Peak grad:   24.0 mmHg MV Decel Time: 211 msec    TR Vmax:        245.00 cm/s MV E velocity: 97.30 cm/s                            SHUNTS                            Systemic VTI:  0.18 m                            Systemic Diam: 1.90 cm Rozann Lesches MD Electronically signed by Rozann Lesches MD Signature Date/Time: 11/06/2020/12:38:48 PM    Final    CT Maxillofacial Wo Contrast  Result Date: 11/05/2020 CLINICAL DATA:  Fall. EXAM: CT HEAD WITHOUT CONTRAST CT MAXILLOFACIAL WITHOUT CONTRAST CT CERVICAL SPINE WITHOUT CONTRAST TECHNIQUE: Multidetector CT imaging of the head, cervical spine, and maxillofacial structures were performed using the standard protocol without intravenous contrast. Multiplanar CT image reconstructions of the cervical spine and maxillofacial structures were also generated. COMPARISON:  CT head dated May 11, 2019. MRI head and cervical spine dated May 05, 2019. CT maxillofacial dated April 12, 2019. FINDINGS: CT HEAD FINDINGS Brain: No evidence of acute infarction, hemorrhage, hydrocephalus, extra-axial collection or mass lesion/mass effect. Stable mild atrophy. Vascular: Atherosclerotic vascular calcification of the carotid siphons. No hyperdense vessel. Skull: Normal. Negative for fracture or focal lesion. Other: None. CT MAXILLOFACIAL FINDINGS Osseous: No fracture or mandibular dislocation. No destructive process. Orbits: Negative. No traumatic or inflammatory finding. Sinuses: Chronic right maxillary sinusitis and mucoperiosteal thickening. New small air-fluid levels in the left maxillary and right sphenoid sinuses. The mastoid air cells are clear. Soft tissues: Small nasal soft tissue laceration. CT CERVICAL SPINE FINDINGS Alignment: No traumatic malalignment. Unchanged focal reversal  of the normal cervical lordosis at C5-C6. Skull base and vertebrae: No acute fracture. No primary bone lesion or focal pathologic process. C5-C6 ankylosis. Soft tissues and spinal canal: No prevertebral fluid or swelling. No visible canal hematoma. Disc levels:  Progressive now severe disc height loss at C6-C7. Upper chest: Negative. Other: None. IMPRESSION: CT head: 1. No acute intracranial abnormality. CT maxillofacial: 1. No acute maxillofacial fracture. Small nasal soft tissue laceration. 2. Chronic right maxillary sinusitis. New small air-fluid levels in the left maxillary and right sphenoid sinuses. Correlate for acute sinusitis. CT cervical spine: 1. No acute cervical spine fracture or traumatic listhesis. 2. Progressive now severe degenerative disc disease at C6-C7. Electronically Signed   By: Titus Dubin M.D.   On: 11/05/2020 17:41       Discharge Exam: Vitals:   11/11/20 0435 11/11/20 0859  BP: (!) 161/77 (!) 155/74  Pulse: 70 79  Resp: 18  Temp: 98.3 F (36.8 C)   SpO2: 100%     General: Pt is alert, awake, not in acute distress Cardiovascular: RRR, S1/S2 +, no edema Respiratory: CTA bilaterally, no wheezing, no rhonchi, no respiratory distress, no conversational dyspnea  Abdominal: Soft, NT, ND, bowel sounds + Extremities: no edema, no cyanosis Psych: Normal mood and affect, stable judgement and insight     The results of significant diagnostics from this hospitalization (including imaging, microbiology, ancillary and laboratory) are listed below for reference.     Microbiology: Recent Results (from the past 240 hour(s))  Resp Panel by RT-PCR (Flu A&B, Covid) Nasopharyngeal Swab     Status: None   Collection Time: 11/05/20  7:22 PM   Specimen: Nasopharyngeal Swab; Nasopharyngeal(NP) swabs in vial transport medium  Result Value Ref Range Status   SARS Coronavirus 2 by RT PCR NEGATIVE NEGATIVE Final    Comment: (NOTE) SARS-CoV-2 target nucleic acids are NOT  DETECTED.  The SARS-CoV-2 RNA is generally detectable in upper respiratory specimens during the acute phase of infection. The lowest concentration of SARS-CoV-2 viral copies this assay can detect is 138 copies/mL. A negative result does not preclude SARS-Cov-2 infection and should not be used as the sole basis for treatment or other patient management decisions. A negative result may occur with  improper specimen collection/handling, submission of specimen other than nasopharyngeal swab, presence of viral mutation(s) within the areas targeted by this assay, and inadequate number of viral copies(<138 copies/mL). A negative result must be combined with clinical observations, patient history, and epidemiological information. The expected result is Negative.  Fact Sheet for Patients:  EntrepreneurPulse.com.au  Fact Sheet for Healthcare Providers:  IncredibleEmployment.be  This test is no t yet approved or cleared by the [place] FDA and  has been authorized for detection and/or diagnosis of SARS-CoV-2 by FDA under an Emergency Use Authorization (EUA). This EUA will remain  in effect (meaning this test can be used) for the duration of the COVID-19 declaration under Section 564(b)(1) of the Act, 21 U.S.C.section 360bbb-3(b)(1), unless the authorization is terminated  or revoked sooner.       Influenza A by PCR NEGATIVE NEGATIVE Final   Influenza B by PCR NEGATIVE NEGATIVE Final    Comment: (NOTE) The Xpert Xpress SARS-CoV-2/FLU/RSV plus assay is intended as an aid in the diagnosis of influenza from Nasopharyngeal swab specimens and should not be used as a sole basis for treatment. Nasal washings and aspirates are unacceptable for Xpert Xpress SARS-CoV-2/FLU/RSV testing.  Fact Sheet for Patients: EntrepreneurPulse.com.au  Fact Sheet for Healthcare Providers: IncredibleEmployment.be  This test is not yet  approved or cleared by the [place] FDA and has been authorized for detection and/or diagnosis of SARS-CoV-2 by FDA under an Emergency Use Authorization (EUA). This EUA will remain in effect (meaning this test can be used) for the duration of the COVID-19 declaration under Section 564(b)(1) of the Act, 21 U.S.C. section 360bbb-3(b)(1), unless the authorization is terminated or revoked.  Performed at [facility], [address] [place], [place] [address]   MRSA PCR Screening     Status: None   Collection Time: 11/05/20  9:19 PM   Specimen: Nasopharyngeal  Result Value Ref Range Status   MRSA by PCR NEGATIVE NEGATIVE Final    Comment:        The GeneXpert MRSA Assay (FDA approved for NASAL specimens only), is one component of a comprehensive MRSA colonization surveillance program. It is not intended to diagnose MRSA infection nor to  guide or monitor treatment for MRSA infections. Performed at [facility], [address], [place], [place] [address]      Labs: BNP (last 3 results) No results for input(s): BNP in the last 8760 hours. Basic Metabolic Panel: Recent Labs  Lab 11/05/20 1959 11/07/20 1132 11/08/20 0441 11/09/20 0348 11/10/20 0500 11/11/20 0730  NA  --  139 140 139 139 137  K  --  4.4 3.9 5.1 4.4 4.7  CL  --  109 111 112* 108 107  CO2  --  23 27 27 28 28   GLUCOSE  --  161* 58* 177* 109* 245*  BUN  --  51* 37* 25* 15 19  CREATININE  --  1.64* 1.41* 1.64* 1.35* 1.33*  CALCIUM  --  9.9 9.0 9.1 9.5 9.2  MG 1.9  --   --   --   --   --   PHOS 3.5  --   --   --   --   --    Liver Function Tests: Recent Labs  Lab 11/05/20 1550  AST 31  ALT 23  ALKPHOS 83  BILITOT 0.6  PROT 7.2  ALBUMIN 3.6   No results for input(s): LIPASE, AMYLASE in the last 168 hours. No results for input(s): AMMONIA in the last 168 hours. CBC: Recent Labs  Lab 11/05/20 1550 11/07/20 1132 11/07/20 1727 11/08/20 1023 11/08/20 1634 11/09/20 0348 11/10/20 0500  11/11/20 0730  WBC 7.0 4.7  --   --   --  4.0 3.5* 3.5*  NEUTROABS 5.6  --   --   --   --   --   --   --   HGB 13.8 9.9*   < > 8.6* 8.5* 8.2* 8.8* 9.0*  HCT 40.9 28.6*   < > 25.0* 24.3* 23.7* 25.6* 25.7*  MCV 84.3 82.2  --   --   --  82.6 82.8 81.8  PLT 203 178  --   --   --  129* 146* 160   < > = values in this interval not displayed.   Cardiac Enzymes: Recent Labs  Lab 11/05/20 1550 11/07/20 1727  CKTOTAL 223 117   BNP: Invalid input(s): POCBNP CBG: Recent Labs  Lab 11/10/20 0617 11/10/20 1147 11/10/20 1646 11/10/20 2306 11/11/20 0611  GLUCAP 113* 238* 327* 322* 259*   D-Dimer No results for input(s): DDIMER in the last 72 hours. Hgb A1c No results for input(s): HGBA1C in the last 72 hours. Lipid Profile No results for input(s): CHOL, HDL, LDLCALC, TRIG, CHOLHDL, LDLDIRECT in the last 72 hours. Thyroid function studies No results for input(s): TSH, T4TOTAL, T3FREE, THYROIDAB in the last 72 hours.  Invalid input(s): FREET3 Anemia work up No results for input(s): VITAMINB12, FOLATE, FERRITIN, TIBC, IRON, RETICCTPCT in the last 72 hours. Urinalysis    Component Value Date/Time   COLORURINE YELLOW 05/05/2019 0821   APPEARANCEUR CLEAR 05/05/2019 0821   LABSPEC 1.019 05/05/2019 0821   PHURINE 5.0 05/05/2019 0821   GLUCOSEU 150 (A) 05/05/2019 0821   HGBUR NEGATIVE 05/05/2019 0821   BILIRUBINUR NEGATIVE 05/05/2019 0821   KETONESUR NEGATIVE 05/05/2019 0821   PROTEINUR 30 (A) 05/05/2019 0821   UROBILINOGEN 0.2 09/15/2012 1010   NITRITE NEGATIVE 05/05/2019 0821   LEUKOCYTESUR LARGE (A) 05/05/2019 0821   Sepsis Labs Invalid input(s): PROCALCITONIN,  WBC,  LACTICIDVEN Microbiology Recent Results (from the past 240 hour(s))  Resp Panel by RT-PCR (Flu A&B, Covid) Nasopharyngeal Swab     Status: None   Collection  Time: 11/05/20  7:22 PM   Specimen: Nasopharyngeal Swab; Nasopharyngeal(NP) swabs in vial transport medium  Result Value Ref Range Status   SARS  Coronavirus 2 by RT PCR NEGATIVE NEGATIVE Final    Comment: (NOTE) SARS-CoV-2 target nucleic acids are NOT DETECTED.  The SARS-CoV-2 RNA is generally detectable in upper respiratory specimens during the acute phase of infection. The lowest concentration of SARS-CoV-2 viral copies this assay can detect is 138 copies/mL. A negative result does not preclude SARS-Cov-2 infection and should not be used as the sole basis for treatment or other patient management decisions. A negative result may occur with  improper specimen collection/handling, submission of specimen other than nasopharyngeal swab, presence of viral mutation(s) within the areas targeted by this assay, and inadequate number of viral copies(<138 copies/mL). A negative result must be combined with clinical observations, patient history, and epidemiological information. The expected result is Negative.  Fact Sheet for Patients:  EntrepreneurPulse.com.au  Fact Sheet for Healthcare Providers:  IncredibleEmployment.be  This test is no t yet approved or cleared by the [place] FDA and  has been authorized for detection and/or diagnosis of SARS-CoV-2 by FDA under an Emergency Use Authorization (EUA). This EUA will remain  in effect (meaning this test can be used) for the duration of the COVID-19 declaration under Section 564(b)(1) of the Act, 21 U.S.C.section 360bbb-3(b)(1), unless the authorization is terminated  or revoked sooner.       Influenza A by PCR NEGATIVE NEGATIVE Final   Influenza B by PCR NEGATIVE NEGATIVE Final    Comment: (NOTE) The Xpert Xpress SARS-CoV-2/FLU/RSV plus assay is intended as an aid in the diagnosis of influenza from Nasopharyngeal swab specimens and should not be used as a sole basis for treatment. Nasal washings and aspirates are unacceptable for Xpert Xpress SARS-CoV-2/FLU/RSV testing.  Fact Sheet for  Patients: EntrepreneurPulse.com.au  Fact Sheet for Healthcare Providers: IncredibleEmployment.be  This test is not yet approved or cleared by the [place] FDA and has been authorized for detection and/or diagnosis of SARS-CoV-2 by FDA under an Emergency Use Authorization (EUA). This EUA will remain in effect (meaning this test can be used) for the duration of the COVID-19 declaration under Section 564(b)(1) of the Act, 21 U.S.C. section 360bbb-3(b)(1), unless the authorization is terminated or revoked.  Performed at [facility], [address], [place], [place] [address]   MRSA PCR Screening     Status: None   Collection Time: 11/05/20  9:19 PM   Specimen: Nasopharyngeal  Result Value Ref Range Status   MRSA by PCR NEGATIVE NEGATIVE Final    Comment:        The GeneXpert MRSA Assay (FDA approved for NASAL specimens only), is one component of a comprehensive MRSA colonization surveillance program. It is not intended to diagnose MRSA infection nor to guide or monitor treatment for MRSA infections. Performed at [facility], [address], [place], [place] [address]      Patient was seen and examined on the day of discharge and was found to be in stable condition. Time coordinating discharge: 25 minutes including assessment and coordination of care, as well as examination of the patient.   SIGNED:  Dessa Phi, DO [facility] 11/11/2020, 9:27 AM

## 2020-11-11 NOTE — Progress Notes (Signed)
D/C instructions given and reviewed. Tele and IV removed, tolerated well. Stated family will arrive around 6pm. Requesting to shower after lunch.

## 2020-11-12 ENCOUNTER — Other Ambulatory Visit: Payer: Self-pay

## 2020-11-12 NOTE — Patient Outreach (Signed)
[facility]) Care Management  11/12/2020  Cassandra Adams 08/15/1945 [id number]   [facility] [ACO] Patient: Cassandra Adams Medicare   Patient screened for less than 7 days readmission hospitalization and to assess for potential [facility] Management service needs for post hospital transition.  Review of patient's medical record reveals patient is transitioned home.  Primary Care Provider:  Dr. Reesa Chew, [place] this office is listed to provide the transition of care follow up. Patient transitioned home and a called was placed to follow up for post hospital needs.  Was able to leave a HIPAA approved voicemail requesting a call back.  Plan:  Will follow up if patient returns call and assess for further needs.    For questions contact:   Natividad Brood, RN BSN [facility] Liaison  [phone] business mobile phone Toll free office [phone]  Fax number: [phone] [email or web address]

## 2020-11-16 DIAGNOSIS — K922 Gastrointestinal hemorrhage, unspecified: Secondary | ICD-10-CM | POA: Diagnosis not present

## 2020-11-16 DIAGNOSIS — E1140 Type 2 diabetes mellitus with diabetic neuropathy, unspecified: Secondary | ICD-10-CM | POA: Diagnosis not present

## 2020-11-16 DIAGNOSIS — R55 Syncope and collapse: Secondary | ICD-10-CM | POA: Diagnosis not present

## 2020-11-16 DIAGNOSIS — M2142 Flat foot [pes planus] (acquired), left foot: Secondary | ICD-10-CM | POA: Diagnosis not present

## 2020-11-16 DIAGNOSIS — R201 Hypoesthesia of skin: Secondary | ICD-10-CM | POA: Diagnosis not present

## 2020-11-16 DIAGNOSIS — K9289 Other specified diseases of the digestive system: Secondary | ICD-10-CM | POA: Diagnosis not present

## 2020-11-16 DIAGNOSIS — Z6829 Body mass index (BMI) 29.0-29.9, adult: Secondary | ICD-10-CM | POA: Diagnosis not present

## 2020-11-16 DIAGNOSIS — K2970 Gastritis, unspecified, without bleeding: Secondary | ICD-10-CM | POA: Diagnosis not present

## 2020-11-16 DIAGNOSIS — E663 Overweight: Secondary | ICD-10-CM | POA: Diagnosis not present

## 2020-11-17 ENCOUNTER — Other Ambulatory Visit (HOSPITAL_COMMUNITY): Payer: Self-pay | Admitting: Internal Medicine

## 2020-11-17 DIAGNOSIS — E2839 Other primary ovarian failure: Secondary | ICD-10-CM

## 2020-11-17 DIAGNOSIS — Z1231 Encounter for screening mammogram for malignant neoplasm of breast: Secondary | ICD-10-CM

## 2020-11-23 DIAGNOSIS — E1140 Type 2 diabetes mellitus with diabetic neuropathy, unspecified: Secondary | ICD-10-CM | POA: Diagnosis not present

## 2020-11-23 DIAGNOSIS — I10 Essential (primary) hypertension: Secondary | ICD-10-CM | POA: Diagnosis not present

## 2020-11-23 DIAGNOSIS — K219 Gastro-esophageal reflux disease without esophagitis: Secondary | ICD-10-CM | POA: Diagnosis not present

## 2020-11-26 ENCOUNTER — Telehealth: Payer: Self-pay | Admitting: Internal Medicine

## 2020-11-26 MED ORDER — NITROGLYCERIN 0.4 MG SL SUBL: 0.4000 mg | SUBLINGUAL_TABLET | SUBLINGUAL | 3 refills | Status: DC | PRN

## 2020-11-26 MED ORDER — NITROGLYCERIN 0.4 MG SL SUBL: 0.4000 mg | SUBLINGUAL_TABLET | SUBLINGUAL | 11 refills | Status: DC | PRN

## 2020-11-26 NOTE — Telephone Encounter (Signed)
Let patient know that refills for her Nitro has been sent to the pharmacy.

## 2020-11-26 NOTE — Telephone Encounter (Signed)
*  STAT* If patient is at the pharmacy, call can be transferred to refill team.   1. Which medications need to be refilled? (please list name of each medication and dose if known)  nitroGLYCERIN (NITROSTAT) 0.4 MG SL tablet  2. Which pharmacy/location (including street and city if local pharmacy) is medication to be sent to? [id number] - [place], [place]  3. Do they need a 30 day or 90 day supply?  Patient is requesting an emergency supply to take as needed

## 2020-11-26 NOTE — Telephone Encounter (Signed)
Called patient left message on personal voice mail NTG refill sent to pharmacy. 

## 2020-12-08 ENCOUNTER — Other Ambulatory Visit: Payer: Self-pay

## 2020-12-08 ENCOUNTER — Other Ambulatory Visit (HOSPITAL_COMMUNITY): Payer: Medicare HMO

## 2020-12-08 ENCOUNTER — Ambulatory Visit (HOSPITAL_COMMUNITY)
Admission: RE | Admit: 2020-12-08 | Discharge: 2020-12-08 | Disposition: A | Discharge status: Home / Self Care | Payer: Medicare HMO | Source: Ambulatory Visit | Attending: Internal Medicine | Admitting: Internal Medicine

## 2020-12-08 DIAGNOSIS — E2839 Other primary ovarian failure: Secondary | ICD-10-CM | POA: Diagnosis not present

## 2020-12-08 DIAGNOSIS — Z780 Asymptomatic menopausal state: Secondary | ICD-10-CM | POA: Diagnosis not present

## 2020-12-08 DIAGNOSIS — M85852 Other specified disorders of bone density and structure, left thigh: Secondary | ICD-10-CM | POA: Diagnosis not present

## 2020-12-21 ENCOUNTER — Other Ambulatory Visit: Payer: Self-pay

## 2020-12-21 ENCOUNTER — Ambulatory Visit: Payer: Medicare HMO | Admitting: Physician Assistant

## 2020-12-21 ENCOUNTER — Encounter: Payer: Self-pay | Admitting: Physician Assistant

## 2020-12-21 VITALS — BP 166/94 | HR 119 | Ht 62.0 in | Wt 184.0 lb

## 2020-12-21 DIAGNOSIS — N183 Chronic kidney disease, stage 3 unspecified: Secondary | ICD-10-CM

## 2020-12-21 DIAGNOSIS — Z79899 Other long term (current) drug therapy: Secondary | ICD-10-CM | POA: Diagnosis not present

## 2020-12-21 DIAGNOSIS — E119 Type 2 diabetes mellitus without complications: Secondary | ICD-10-CM

## 2020-12-21 DIAGNOSIS — I10 Essential (primary) hypertension: Secondary | ICD-10-CM

## 2020-12-21 DIAGNOSIS — I4891 Unspecified atrial fibrillation: Secondary | ICD-10-CM

## 2020-12-21 DIAGNOSIS — I2510 Atherosclerotic heart disease of native coronary artery without angina pectoris: Secondary | ICD-10-CM

## 2020-12-21 MED ORDER — LISINOPRIL 20 MG PO TABS: 20.0000 mg | ORAL_TABLET | Freq: Every day | ORAL | 3 refills | Status: DC

## 2020-12-21 MED ORDER — ATORVASTATIN CALCIUM 80 MG PO TABS: 80.0000 mg | ORAL_TABLET | Freq: Every evening | ORAL | 3 refills | Status: DC

## 2020-12-21 MED ORDER — FUROSEMIDE 40 MG PO TABS: 40.0000 mg | ORAL_TABLET | Freq: Two times a day (BID) | ORAL | 3 refills | Status: DC

## 2020-12-21 MED ORDER — CARVEDILOL 6.25 MG PO TABS: 6.2500 mg | ORAL_TABLET | Freq: Two times a day (BID) | ORAL | 3 refills | Status: DC

## 2020-12-21 NOTE — Patient Instructions (Addendum)
Medication Instructions:  INCREASE Lasix to 40 mg 2 times a day INCREASE Carvedilol (Coreg) to 6.25 mg 2 times a day  *If you need a refill on your cardiac medications before your next appointment, please call your pharmacy*  Lab Work: Your physician recommends that you return for lab work in 1 week:  BMET CBC  If you have labs (blood work) drawn today and your tests are completely normal, you will receive your results only by: [employer] (if you have MyChart) OR A paper copy in the mail If you have any lab test that is abnormal or we need to change your treatment, we will call you to review the results.  Testing/Procedures: NONE ordered at this time of appointment   Follow-Up: At [facility], you and your health needs are our priority.  As part of our continuing mission to provide you with exceptional heart care, we have created designated Provider Care Teams.  These Care Teams include your primary Cardiologist (physician) and Advanced Practice Providers (APPs -  Physician Assistants and Nurse Practitioners) who all work together to provide you with the care you need, when you need it.  We recommend signing up for the patient portal called "MyChart".  Sign up information is provided on this After Visit Summary.  MyChart is used to connect with patients for Virtual Visits (Telemedicine).  Patients are able to view lab/test results, encounter notes, upcoming appointments, etc.  Non-urgent messages can be sent to your provider as well.   To learn more about what you can do with MyChart, go to [email or web address].    Your next appointment:   2-3  week(s) 01/05/21 at 1:15 PM  The format for your next appointment:   In Person  Provider:   Almyra Deforest, PA-C  Other Instructions

## 2020-12-21 NOTE — Progress Notes (Signed)
Cardiology Office Note:    Date:  12/22/2020   ID:  Cassandra Adams 18-Jan-1946, MRN [id number]  PCP:  Sharilyn Sites, MD   [facility] Providers Cardiologist:  [name], MD     Referring MD: Sharilyn Sites, MD   Chief Complaint  Patient presents with   Follow-up    Seen for Dr. Debara Pickett     History of Present Illness:    Cassandra Adams is a 75 y.o. female with a hx of hypertension, DM2, CAD, PAF, CKD stage III and history of TIA.  Cardiac catheterization performed in July 2015 demonstrated occlusion of mid RCA, moderate LAD lesion was 50% stenosis in the proximal area followed by a 60 to 70% stenosis, normal left main and left circumflex artery, EF 60%.  Given lack of ongoing symptoms and preserved LV function, it was recommended to treat RCA occlusion medically.  Patient was last seen by Dr. Debara Pickett via virtual visit on 07/08/2020 at which time she was doing well.  Based on blood work, she had a significant vitamin D deficiency.  Her lisinopril was increased to 20 mg daily to help control the blood pressure.  She was also started on vitamin D 50,000 units weekly for 6 weeks and 2000 units daily thereafter.  Carotid Doppler revealed mild 1 to 39% stenosis in the left carotid artery, no evidence of stenosis in the right carotid artery.  1 year repeat study was recommended.  Since the last visit, patient was admitted in early May 2022 after having a fall at home from her recliner.  She suffered nasal contusion and a passing out spell.  Maxillofacial CT did not show any fractures however did show a small nasal soft tissue laceration with small air-fluid level in the left maxillary and right sphenoid sinuses.  CT of cervical spine showed progressive severe degenerative disease at C6-7 however no acute fracture.  CT of the head showed no intracranial etiology.  Nosebleed was stopped in the emergency room with nasal packing.  EKG in the ED demonstrated recurrence of atrial  fibrillation with RVR.  Eliquis temporarily held however resumed the day after discharge.  There is a questionable occurrence of syncope during the fall, however this was felt to be vasovagal.  Echocardiogram obtained during the hospitalization showed EF 55 to 60%, moderate asymmetric LVH of basal septal segment, RVSP 27 mmHg, possible small to moderate pericardial effusion with no hemodynamic significance, mild MR.  More recently, patient was readmitted a few days after discharge in May 2022 after developing significant vomiting with coffee-ground emesis.  GI service was consulted during the second admission and patient underwent EGD on 5/16 that showed erosive gastropathy with no stigmata of bleeding.  It was recommended to start Protonix 40 mg twice a day for 12 weeks and also resume Eliquis.   Patient presents today for follow-up.  Since discharge, she has missed 3 doses of Eliquis.  The last missed dose of Eliquis was Sunday, 12/19/2020.  EKG today demonstrated she is in atrial fibrillation with RVR.  On physical exam, she has at least 2+ bilateral lower extremity edema and also mild to moderate degree of bilateral pulmonary pleural effusion based on my physical exam.  I will increase her Lasix to 40 mg twice a day as she is clearly in acute heart failure as result of atrial fibrillation with RVR.  She denies any orthopnea however she has been sleeping upright in a recliner for a long time.  I will also  increase her carvedilol to 6.25 mg twice a day for better rate control.  In the future, I may decide to cut back on hydralazine or lisinopril in order to create enough blood pressure room.  She will need to return in 1 week for a basic metabolic panel and CBC.  Since her last missed dose of Eliquis was 2 days ago, the earliest time we can proceed with cardioversion will be on July 18 or later, I plan to see the patient back on July 13 for reassessment.  May need to increase carvedilol dose further on the next  visit.  I informed the patient that I have very low threshold of sending the patient to the hospital if her breathing deteriorate in the next few days.  Worst case scenario is she go to the hospital and we IV diurese her and do TEE cardioversion instead of waiting 3 weeks.  At this time, patient did not wish to go to the hospital.  Fortunately her O2 saturation is 98% today.    Past Medical History:  Diagnosis Date   Arthritis    "all over"   CAD (coronary artery disease)    a. NSTEMI 12/2013 - occluded dominant RCA with L-R collaterals, moderate prox segmental LAD disease, for medical therapy initially, EF 60% with subtle inferobasal hypokinesia. Consider PCI for refractory CP.   CKD (chronic kidney disease), stage III ([place])    Hypertension    Migraines    "weekly" (01/06/2014)   Sickle cell trait ([place])    TIA (transient ischemic attack) 1978   Type II diabetes mellitus ([place])    Vertigo     Past Surgical History:  Procedure Laterality Date   APPENDECTOMY  March 2014   had appendix frozen   CARDIAC CATHETERIZATION  "years ago" & 01/05/2014   CARDIOVERSION N/A 03/17/2014   Procedure: CARDIOVERSION;  Surgeon: [name], MD;  Location: [place];  Service: Cardiovascular;  Laterality: N/A;   CATARACT EXTRACTION W/ INTRAOCULAR LENS  IMPLANT, BILATERAL Bilateral 04/2013-05/2013   ENDARTERECTOMY Right 02/05/2017   Procedure: ENDARTERECTOMY CAROTID-RIGHT;  Surgeon: [name], MD;  Location: [place];  Service: Vascular;  Laterality: Right;   ESOPHAGOGASTRODUODENOSCOPY  11/08/2007    Normal esophagus without evidence of Barrett, mass, erosion/ Normal stomach, duodenal bulb   ESOPHAGOGASTRODUODENOSCOPY (EGD) WITH PROPOFOL N/A 11/08/2020   Procedure: ESOPHAGOGASTRODUODENOSCOPY (EGD) WITH PROPOFOL;  Surgeon: Thornton Park, MD;  Location: [place];  Service: Gastroenterology;  Laterality: N/A;   GASTRIC MOTILITY STUDY  11/13/2007   mildly delayed emptying subjectively, but  normal  analysis-77% of tracer emptied at 2 hours   JOINT REPLACEMENT     LAPAROSCOPIC APPENDECTOMY N/A 09/15/2012   Procedure: APPENDECTOMY LAPAROSCOPIC;  Surgeon: [name], MD;  Location: [facility];  Service: General;  Laterality: N/A;   LEFT HEART CATHETERIZATION WITH CORONARY ANGIOGRAM N/A 01/05/2014   Procedure: LEFT HEART CATHETERIZATION WITH CORONARY ANGIOGRAM;  Surgeon: [name], MD;  Location: [facility] CATH LAB;  Service: Cardiovascular;  Laterality: N/A;   REPLACEMENT TOTAL KNEE Right 05-03-06   TEE WITHOUT CARDIOVERSION N/A 03/17/2014   Procedure: TRANSESOPHAGEAL ECHOCARDIOGRAM (TEE);  Surgeon: [name], MD;  Location: [facility] ENDOSCOPY;  Service: Cardiovascular;  Laterality: N/A;   TONSILLECTOMY  1972    Current Medications: Current Meds  Medication Sig   aspirin EC 81 MG tablet Take 1 tablet (81 mg total) by mouth daily.   B-D ULTRAFINE III SHORT PEN 31G X 8 MM MISC SMARTSIG:Injection Daily   BD INSULIN  SYRINGE U/F 31G X 5/16" 0.5 ML MISC SMARTSIG:Injection 4 Times Daily   colesevelam (WELCHOL) 625 MG tablet Take 1,875 mg by mouth 2 (two) times daily with a meal. 1875mg  at noon and 1875mg  at night   ELIQUIS 5 MG TABS tablet Take 1 tablet (5 mg total) by mouth 2 (two) times daily.   glipiZIDE (GLUCOTROL XL) 2.5 MG 24 hr tablet Take 1 tablet (2.5 mg total) by mouth daily with breakfast.   hydrALAZINE (APRESOLINE) 25 MG tablet Take 25 mg by mouth 3 (three) times daily.   Insulin Glargine (BASAGLAR KWIKPEN) 100 UNIT/ML Inject 32 Units into the skin at bedtime.   nitroGLYCERIN (NITROSTAT) 0.4 MG SL tablet Place 1 tablet (0.4 mg total) under the tongue every 5 (five) minutes as needed for chest pain (up to 3 doses).   NOVOLOG 100 UNIT/ML injection Inject 6-9 Units into the skin 3 (three) times daily with meals.   ONETOUCH ULTRA test strip    pantoprazole (PROTONIX) 40 MG tablet Take 1 tablet (40 mg total) by mouth 2 (two) times daily.   polyethylene glycol (MIRALAX / GLYCOLAX)  17 g packet Take 17 g by mouth daily.   senna-docusate (SENOKOT-S) 8.6-50 MG tablet Take 1 tablet by mouth at bedtime as needed for mild constipation.   Vitamin D, Cholecalciferol, 25 MCG (1000 UT) TABS Take 2,000 Units by mouth See admin instructions. Every two days   [DISCONTINUED] atorvastatin (LIPITOR) 80 MG tablet Take 1 tablet (80 mg total) by mouth every evening.   [DISCONTINUED] carvedilol (COREG) 3.125 MG tablet Take 3.125 mg by mouth 2 (two) times daily with a meal.   [DISCONTINUED] furosemide (LASIX) 40 MG tablet Take 40 mg by mouth daily.    [DISCONTINUED] lisinopril (ZESTRIL) 20 MG tablet Take 1 tablet (20 mg total) by mouth daily.     Allergies:   Penicillins   Social History   Socioeconomic History   Marital status: Divorced    Spouse name: Not on file   Number of children: Not on file   Years of education: Not on file   Highest education level: Not on file  Occupational History   Not on file  Tobacco Use   Smoking status: Never   Smokeless tobacco: Never  Vaping Use   Vaping Use: Never used  Substance and Sexual Activity   Alcohol use: No   Drug use: No   Sexual activity: Not Currently    Birth control/protection: Post-menopausal  Other Topics Concern   Not on file  Social History Narrative   Not on file   Social Determinants of Health   Financial Resource Strain: Not on file  Food Insecurity: Not on file  Transportation Needs: Not on file  Physical Activity: Not on file  Stress: Not on file  Social Connections: Not on file     Family History: The patient's family history includes Cancer in her father; Diabetes in her brother; Heart attack in her brother and mother; Heart disease in her brother; Hyperlipidemia in her brother and mother; Hypertension in her brother, mother, and sister; Other in her brother.  ROS:   Please see the history of present illness.     All other systems reviewed and are negative.  EKGs/Labs/Other Studies Reviewed:    The  following studies were reviewed today:  Echo 11/06/2020 1. Left ventricular ejection fraction, by estimation, is 55 to 60%. The  left ventricle has normal function. The left ventricle demonstrates  regional wall motion abnormalities (see scoring diagram/findings for  description). There is moderate asymmetric  left ventricular hypertrophy of the basal and septal segments. Left  ventricular diastolic parameters are indeterminate.   2. Right ventricular systolic function is normal. The right ventricular  size is normal. There is normal pulmonary artery systolic pressure. The  estimated right ventricular systolic pressure is 70.6 mmHg.   3. Left atrial size was mild to moderately dilated.   4. Possible small to moderate pericardial fluid collection at posterior  base of LV. No obvious hemodynamic significance.   5. The mitral valve is abnormal, mildly calcified with restricted  posterior leaflet motion. Mild mitral valve regurgitation.   6. The aortic valve is tricuspid. Aortic valve regurgitation is not  visualized. Mild to moderate aortic valve sclerosis/calcification is  present, without any evidence of aortic stenosis.   7. The inferior vena cava is normal in size with greater than 50%  respiratory variability, suggesting right atrial pressure of 3 mmHg.   EKG:  EKG is ordered today.  The ekg ordered today demonstrates atrial fibrillation with RVR  Recent Labs: 06/09/2020: TSH 1.660 11/05/2020: ALT 23; Magnesium 1.9 11/11/2020: BUN 19; Creatinine, Ser 1.33; Hemoglobin 9.0; Platelets 160; Potassium 4.7; Sodium 137  Recent Lipid Panel    Component Value Date/Time   CHOL 128 06/09/2020 0802   TRIG 77 06/09/2020 0802   HDL 61 06/09/2020 0802   CHOLHDL 2.1 06/09/2020 0802   CHOLHDL 2.5 05/04/2019 2350   VLDL 20 05/04/2019 2350   LDLCALC 52 06/09/2020 0802     Risk Assessment/Calculations:    CHA2DS2-VASc Score = 8  This indicates a 10.8% annual risk of stroke. The patient's  score is based upon: CHF History: Yes HTN History: Yes Diabetes History: Yes Stroke History: Yes Vascular Disease History: No Age Score: 2 Gender Score: 1         Physical Exam:    VS:  BP (!) 166/94   Pulse (!) 119   Ht 5\' 2"  (1.575 m)   Wt 184 lb (83.5 kg)   SpO2 98%   BMI 33.65 kg/m     Wt Readings from Last 3 Encounters:  12/21/20 184 lb (83.5 kg)  11/11/20 178 lb 11.2 oz (81.1 kg)  11/06/20 173 lb 1 oz (78.5 kg)     GEN:  Well nourished, well developed in no acute distress HEENT: Normal NECK: No JVD; No carotid bruits LYMPHATICS: No lymphadenopathy CARDIAC: RRR, no murmurs, rubs, gallops RESPIRATORY:  Clear to auscultation without rales, wheezing or rhonchi  ABDOMEN: Soft, non-tender, non-distended MUSCULOSKELETAL:  No edema; No deformity  SKIN: Warm and dry NEUROLOGIC:  Alert and oriented x 3 PSYCHIATRIC:  Normal affect   ASSESSMENT:    1. Atrial fibrillation with RVR ([place])   2. Medication management   3. Coronary artery disease involving native coronary artery of native heart without angina pectoris   4. Essential hypertension   5. Controlled type 2 diabetes mellitus without complication, without long-term current use of insulin (HCC)   6. Stage 3 chronic kidney disease, unspecified whether stage 3a or 3b CKD ([place])    PLAN:    In order of problems listed above:  Atrial fibrillation with RVR: Patient was noted to be back in atrial fibrillation with RVR during her hospitalization in May, however cardiology service was not consulted at the time.  Heart rate has been persistently elevated for the past month.  Will increase carvedilol to 6.25 mg twice a day to help with rate control.  -If we can manage her condition,  we will consider outpatient cardioversion in 3 weeks.  However if her heart failure symptom worsens in the near future, may need to be admitted to the hospital and to consider TEE DCCV.  Acute on chronic diastolic heart failure  -She is clearly  volume overloaded with 2+ pitting edema and bilateral pleural effusion.  I recommended increase Lasix to 40 mg twice a day.  I will see the patient back in 1 to 2 weeks for reassessment.  I have very low threshold of sending this patient to the hospital although she did not wish to be admitted at this time.  Surprisingly, she denies any significant shortness of breath even though she is volume overloaded.  She denies any orthopnea but does admit that she sleep in a recliner.  She denies any paroxysmal nocturnal dyspnea at this time.  O2 saturation is 98% during today's visit.  I discussed with her daughter, if her clinical condition deteriorate, she will need to go to the hospital.  CAD: On aspirin.  Denies any chest pain  Hypertension: Blood pressure elevated in the setting of acute heart failure  DM2: Managed by primary care provider  CKD stage III: Obtain basic metabolic panel in 1 week  Anemia: Recently underwent endoscopy by GI service, no acute source of bleeding was noted.  Repeat CBC 1 week.         Medication Adjustments/Labs and Tests Ordered: Current medicines are reviewed at length with the patient today.  Concerns regarding medicines are outlined above.  Orders Placed This Encounter  Procedures   Basic metabolic panel   CBC   EKG 12-Lead    Meds ordered this encounter  Medications   carvedilol (COREG) 6.25 MG tablet    Sig: Take 1 tablet (6.25 mg total) by mouth 2 (two) times daily with a meal.    Dispense:  180 tablet    Refill:  3    Dose change, new Rx   furosemide (LASIX) 40 MG tablet    Sig: Take 1 tablet (40 mg total) by mouth 2 (two) times daily.    Dispense:  180 tablet    Refill:  3    Dose change, new Rx     Patient Instructions  Medication Instructions:  INCREASE Lasix to 40 mg 2 times a day INCREASE Carvedilol (Coreg) to 6.25 mg 2 times a day  *If you need a refill on your cardiac medications before your next appointment, please call your  pharmacy*  Lab Work: Your physician recommends that you return for lab work in 1 week:  BMET CBC  If you have labs (blood work) drawn today and your tests are completely normal, you will receive your results only by: [employer] (if you have MyChart) OR A paper copy in the mail If you have any lab test that is abnormal or we need to change your treatment, we will call you to review the results.  Testing/Procedures: NONE ordered at this time of appointment   Follow-Up: At [facility], you and your health needs are our priority.  As part of our continuing mission to provide you with exceptional heart care, we have created designated Provider Care Teams.  These Care Teams include your primary Cardiologist (physician) and Advanced Practice Providers (APPs -  Physician Assistants and Nurse Practitioners) who all work together to provide you with the care you need, when you need it.  We recommend signing up for the patient portal called "MyChart".  Sign up information is provided on  this After Visit Summary.  MyChart is used to connect with patients for Virtual Visits (Telemedicine).  Patients are able to view lab/test results, encounter notes, upcoming appointments, etc.  Non-urgent messages can be sent to your provider as well.   To learn more about what you can do with MyChart, go to [email or web address].    Your next appointment:   2-3  week(s) 01/05/21 at 1:15 PM  The format for your next appointment:   In Person  Provider:   Almyra Deforest, PA-C  Other Instructions     Signed, Almyra Deforest, [place]  12/22/2020 11:30 PM    [place]

## 2020-12-22 ENCOUNTER — Encounter: Payer: Self-pay | Admitting: Physician Assistant

## 2020-12-29 ENCOUNTER — Ambulatory Visit (INDEPENDENT_AMBULATORY_CARE_PROVIDER_SITE_OTHER): Payer: Medicare HMO | Admitting: Gastroenterology

## 2020-12-29 ENCOUNTER — Encounter: Payer: Self-pay | Admitting: Gastroenterology

## 2020-12-29 VITALS — BP 154/86 | HR 122 | Ht 66.0 in | Wt 184.0 lb

## 2020-12-29 DIAGNOSIS — Z79899 Other long term (current) drug therapy: Secondary | ICD-10-CM | POA: Diagnosis not present

## 2020-12-29 DIAGNOSIS — R1030 Lower abdominal pain, unspecified: Secondary | ICD-10-CM | POA: Diagnosis not present

## 2020-12-29 NOTE — Patient Instructions (Addendum)
It was my pleasure to provide care to you you today. Based on our discussion, I am providing you with my recommendations below:  RECOMMENDATION(S):   Please add a daily dose of Benefiber, Metamucil or Citrucel  FOLLOW UP:  Please follow up with me as needed. Please call the office at [phone] when you are ready to schedule your appointment.  BMI:  If you are age 76 or older, your body mass index should be between 23-30. Your Body mass index is 29.7 kg/m. If this is out of the aforementioned range listed, please consider follow up with your Primary Care Provider.  MY CHART:  The [facility] providers would like to encourage you to use [facility] to communicate with providers for non-urgent requests or questions.  Due to long hold times on the telephone, sending your provider a message by [facility] may be a faster and more efficient way to get a response.  Please allow 48 business hours for a response.  Please remember that this is for non-urgent requests.   Thank you for trusting me with your gastrointestinal care!    Thornton Park, MD, MPH

## 2020-12-29 NOTE — Progress Notes (Signed)
Referring Provider: Sharilyn Sites, MD Primary Care Physician:  Sharilyn Sites, MD  Reason for Consultation:     IMPRESSION:  Lower abdominal pain x decades Family history of colon polyps (daughter) Recent bleeding during to epistaxis Erosive gastropathy and erosions on EGD 11/08/20  Does not want to know what's wrong and declines any further work-up regarding the abdominal pain - including imaging or colonoscopy. Does not wish to consider colon cancer screening. I did not recommend FOBT or Cologuard as she says she will not have a colonoscopy under any circumstance. Discussed potential for polyp or cancer as the cause of her symptoms. She does not wish to know, prevent the development of cancer, or treat any findings that may be causing her symptoms. Her daughter was present for the conversation.   PLAN: - Colonoscopy if/when she decides - Avoid all NSAIDs - Follow-up PRN   Please see the "Patient Instructions" section for addition details about the plan.  HPI: Cassandra Adams is a 75 y.o. female who returns in hospital follow-up.  Her daughter accompanies her to this appointment. She was initially seen in consultation by Dr. Benson Norway 11/07/2020 for coffee-ground emesis and noncardiac chest pain.  I performed her endoscopy 11/09/2018 that showed erosive gastropathy and erosions.  Biopsies were not obtained as she was on Eliquis at the time of her procedure.  Hematemesis was ultimately attributed to recent epistaxis.  She has a history of atrial fibrillation on Eliquis, diastolic CHF, DM, HTN, CAD s/p MI 2015, and stage 3 CKD.   She returns now with chronic abdominal pain. Her daughter tells that it's been going on for years. Has a nagging pulling in her lower abdomen not associated with defecation, movement, or eating. Feels better with smacking her abdominal wall. She has a soft bowel movement 2-3 times daily. No constipation. Sense of complete evacuation.  Her symptoms have been  persistent and unchanged for many years. No associated weight loss. No blood or mucous in the stools.   Prior abdominal imaging: CT abd/pelvis without contrast 09/15/12 to evaluate this same abdominal pain suggested mild appendicitis but was otherwise negative.  Abdominal films 01/03/14: negative abdominal radiographs.   No prior colonoscopy or colon cancer screening.  Daughter with precancerous polyps. No known family history of colon cancer or polyps. No family history of uterine/endometrial cancer, pancreatic cancer or gastric/stomach cancer.   Past Medical History:  Diagnosis Date   Arthritis    "all over"   CAD (coronary artery disease)    a. NSTEMI 12/2013 - occluded dominant RCA with L-R collaterals, moderate prox segmental LAD disease, for medical therapy initially, EF 60% with subtle inferobasal hypokinesia. Consider PCI for refractory CP.   CKD (chronic kidney disease), stage III ([place])    Hypertension    Migraines    "weekly" (01/06/2014)   Sickle cell trait ([place])    TIA (transient ischemic attack) 1978   Type II diabetes mellitus ([place])    Vertigo     Past Surgical History:  Procedure Laterality Date   APPENDECTOMY  March 2014   had appendix frozen   CARDIAC CATHETERIZATION  "years ago" & 01/05/2014   CARDIOVERSION N/A 03/17/2014   Procedure: CARDIOVERSION;  Surgeon: [name], MD;  Location: [facility];  Service: Cardiovascular;  Laterality: N/A;   CATARACT EXTRACTION W/ INTRAOCULAR LENS  IMPLANT, BILATERAL Bilateral 04/2013-05/2013   ENDARTERECTOMY Right 02/05/2017   Procedure: ENDARTERECTOMY CAROTID-RIGHT;  Surgeon: [name], MD;  Location: [facility];  Service: Vascular;  Laterality:  Right;   ESOPHAGOGASTRODUODENOSCOPY  11/08/2007    Normal esophagus without evidence of Barrett, mass, erosion/ Normal stomach, duodenal bulb   ESOPHAGOGASTRODUODENOSCOPY (EGD) WITH PROPOFOL N/A 11/08/2020   Procedure: ESOPHAGOGASTRODUODENOSCOPY (EGD) WITH PROPOFOL;  Surgeon:  Thornton Park, MD;  Location: [facility];  Service: Gastroenterology;  Laterality: N/A;   GASTRIC MOTILITY STUDY  11/13/2007   mildly delayed emptying subjectively, but normal  analysis-77% of tracer emptied at 2 hours   JOINT REPLACEMENT     LAPAROSCOPIC APPENDECTOMY N/A 09/15/2012   Procedure: APPENDECTOMY LAPAROSCOPIC;  Surgeon: [name], MD;  Location: [facility];  Service: General;  Laterality: N/A;   LEFT HEART CATHETERIZATION WITH CORONARY ANGIOGRAM N/A 01/05/2014   Procedure: LEFT HEART CATHETERIZATION WITH CORONARY ANGIOGRAM;  Surgeon: [name], MD;  Location: [facility] CATH LAB;  Service: Cardiovascular;  Laterality: N/A;   REPLACEMENT TOTAL KNEE Right 05-03-06   TEE WITHOUT CARDIOVERSION N/A 03/17/2014   Procedure: TRANSESOPHAGEAL ECHOCARDIOGRAM (TEE);  Surgeon: [name], MD;  Location: [facility] ENDOSCOPY;  Service: Cardiovascular;  Laterality: N/A;   TONSILLECTOMY  1972    Current Outpatient Medications  Medication Sig Dispense Refill   aspirin EC 81 MG tablet Take 1 tablet (81 mg total) by mouth daily. 30 tablet 11   atorvastatin (LIPITOR) 80 MG tablet Take 1 tablet (80 mg total) by mouth every evening. 90 tablet 3   B-D ULTRAFINE III SHORT PEN 31G X 8 MM MISC SMARTSIG:Injection Daily     BD INSULIN SYRINGE U/F 31G X 5/16" 0.5 ML MISC SMARTSIG:Injection 4 Times Daily     carvedilol (COREG) 6.25 MG tablet Take 1 tablet (6.25 mg total) by mouth 2 (two) times daily with a meal. 180 tablet 3   colesevelam (WELCHOL) 625 MG tablet Take 1,875 mg by mouth 2 (two) times daily with a meal. 1875mg  at noon and 1875mg  at night     ELIQUIS 5 MG TABS tablet Take 1 tablet (5 mg total) by mouth 2 (two) times daily. 180 tablet 1   furosemide (LASIX) 40 MG tablet Take 1 tablet (40 mg total) by mouth 2 (two) times daily. 180 tablet 3   hydrALAZINE (APRESOLINE) 25 MG tablet Take 25 mg by mouth 3 (three) times daily.     Insulin Glargine (BASAGLAR KWIKPEN) 100 UNIT/ML Inject 32 Units into the  skin at bedtime. 30 mL 2   lisinopril (ZESTRIL) 20 MG tablet Take 1 tablet (20 mg total) by mouth daily. 90 tablet 3   nitroGLYCERIN (NITROSTAT) 0.4 MG SL tablet Place 1 tablet (0.4 mg total) under the tongue every 5 (five) minutes as needed for chest pain (up to 3 doses). 25 tablet 11   NOVOLOG 100 UNIT/ML injection Inject 6-9 Units into the skin 3 (three) times daily with meals.     ONETOUCH ULTRA test strip      pantoprazole (PROTONIX) 40 MG tablet Take 1 tablet (40 mg total) by mouth 2 (two) times daily. 168 tablet 0   polyethylene glycol (MIRALAX / GLYCOLAX) 17 g packet Take 17 g by mouth daily. 14 each 0   senna-docusate (SENOKOT-S) 8.6-50 MG tablet Take 1 tablet by mouth at bedtime as needed for mild constipation. 30 tablet 0   Vitamin D, Cholecalciferol, 25 MCG (1000 UT) TABS Take 2,000 Units by mouth See admin instructions. Every two days     No current facility-administered medications for this visit.    Allergies as of 12/29/2020 - Review Complete 12/29/2020  Allergen Reaction Noted   Penicillins Hives  Physical Exam: General:   Alert,  well-nourished, pleasant and cooperative in NAD Head:  Normocephalic and atraumatic. Eyes:  Sclera clear, no icterus.   Conjunctiva pink. Abdomen:  Soft, nontender, nondistended, normal bowel sounds, no rebound or guarding. No hepatosplenomegaly.   Neurologic:  Alert and  oriented x4;  grossly nonfocal Skin:  Intact without significant lesions or rashes. Psych:  Alert and cooperative. Normal mood and affect.     Tala Eber L. Tarri Glenn, MD, MPH 12/29/2020, 3:39 PM

## 2020-12-30 LAB — CBC
Hematocrit: 32.8 % — ABNORMAL LOW (ref 34.0–46.6)
Hemoglobin: 10.8 g/dL — ABNORMAL LOW (ref 11.1–15.9)
MCH: 26.5 pg — ABNORMAL LOW (ref 26.6–33.0)
MCHC: 32.9 g/dL (ref 31.5–35.7)
MCV: 81 fL (ref 79–97)
Platelets: 205 10*3/uL (ref 150–450)
RBC: 4.07 x10E6/uL (ref 3.77–5.28)
RDW: 14.2 % (ref 11.7–15.4)
WBC: 4.4 10*3/uL (ref 3.4–10.8)

## 2020-12-30 LAB — BASIC METABOLIC PANEL
BUN/Creatinine Ratio: 22 (ref 12–28)
BUN: 29 mg/dL — ABNORMAL HIGH (ref 8–27)
CO2: 24 mmol/L (ref 20–29)
Calcium: 9.6 mg/dL (ref 8.7–10.3)
Chloride: 108 mmol/L — ABNORMAL HIGH (ref 96–106)
Creatinine, Ser: 1.3 mg/dL — ABNORMAL HIGH (ref 0.57–1.00)
Glucose: 88 mg/dL (ref 65–99)
Potassium: 4.7 mmol/L (ref 3.5–5.2)
Sodium: 146 mmol/L — ABNORMAL HIGH (ref 134–144)
eGFR: 43 mL/min/{1.73_m2} — ABNORMAL LOW (ref >59)

## 2020-12-30 NOTE — Progress Notes (Signed)
Red blood cell count continue to improve

## 2020-12-31 ENCOUNTER — Telehealth: Payer: Self-pay

## 2020-12-31 NOTE — Progress Notes (Signed)
LVM 7/8

## 2020-12-31 NOTE — Telephone Encounter (Addendum)
Left message for patient to call [facility] ----- Message from [place], [place] sent at 12/30/2020  4:01 PM EDT ----- Red blood cell count continue to improve

## 2021-01-04 ENCOUNTER — Other Ambulatory Visit: Payer: Self-pay

## 2021-01-04 MED ORDER — CARVEDILOL 12.5 MG PO TABS: 12.5000 mg | ORAL_TABLET | Freq: Two times a day (BID) | ORAL | 2 refills | Status: DC

## 2021-01-05 ENCOUNTER — Ambulatory Visit (INDEPENDENT_AMBULATORY_CARE_PROVIDER_SITE_OTHER): Payer: Medicare HMO | Admitting: Physician Assistant

## 2021-01-05 ENCOUNTER — Ambulatory Visit: Payer: Medicare HMO | Admitting: Physician Assistant

## 2021-01-05 ENCOUNTER — Encounter: Payer: Self-pay | Admitting: Physician Assistant

## 2021-01-05 ENCOUNTER — Other Ambulatory Visit: Payer: Self-pay

## 2021-01-05 VITALS — BP 128/70 | HR 121 | Ht 66.0 in | Wt 184.0 lb

## 2021-01-05 DIAGNOSIS — I4891 Unspecified atrial fibrillation: Secondary | ICD-10-CM | POA: Diagnosis not present

## 2021-01-05 DIAGNOSIS — Z01818 Encounter for other preprocedural examination: Secondary | ICD-10-CM

## 2021-01-05 DIAGNOSIS — I10 Essential (primary) hypertension: Secondary | ICD-10-CM | POA: Diagnosis not present

## 2021-01-05 DIAGNOSIS — E785 Hyperlipidemia, unspecified: Secondary | ICD-10-CM | POA: Diagnosis not present

## 2021-01-05 DIAGNOSIS — E119 Type 2 diabetes mellitus without complications: Secondary | ICD-10-CM

## 2021-01-05 DIAGNOSIS — E1122 Type 2 diabetes mellitus with diabetic chronic kidney disease: Secondary | ICD-10-CM | POA: Diagnosis not present

## 2021-01-05 DIAGNOSIS — N183 Chronic kidney disease, stage 3 unspecified: Secondary | ICD-10-CM

## 2021-01-05 DIAGNOSIS — Z01810 Encounter for preprocedural cardiovascular examination: Secondary | ICD-10-CM

## 2021-01-05 NOTE — Patient Instructions (Signed)
Medication Instructions:  Your physician recommends that you continue on your current medications as directed. Please refer to the Current Medication list given to you today.  *If you need a refill on your cardiac medications before your next appointment, please call your pharmacy*  Lab Work: Your physician recommends that you return for lab work 1 week prior to Cardioversion:  BMET CBC If you have labs (blood work) drawn today and your tests are completely normal, you will receive your results only by: [place] (if you have MyChart) OR A paper copy in the mail If you have any lab test that is abnormal or we need to change your treatment, we will call you to review the results.  Testing/Procedures: Your physician has recommended that you have a Cardioversion (DCCV). Electrical Cardioversion uses a jolt of electricity to your heart either through paddles or wired patches attached to your chest. This is a controlled, usually prescheduled, procedure. Defibrillation is done under light anesthesia in the hospital, and you usually go home the day of the procedure. This is done to get your heart back into a normal rhythm. You are not awake for the procedure. Please see the instruction sheet given to you today.  Follow-Up: At [facility], you and your health needs are our priority.  As part of our continuing mission to provide you with exceptional heart care, we have created designated Provider Care Teams.  These Care Teams include your primary Cardiologist (physician) and Advanced Practice Providers (APPs -  Physician Assistants and Nurse Practitioners) who all work together to provide you with the care you need, when you need it.  We recommend signing up for the patient portal called "MyChart".  Sign up information is provided on this After Visit Summary.  MyChart is used to connect with patients for Virtual Visits (Telemedicine).  Patients are able to view lab/test results, encounter notes,  upcoming appointments, etc.  Non-urgent messages can be sent to your provider as well.   To learn more about what you can do with MyChart, go to [email or web address].    Your next appointment:   3-4 week(s)  The format for your next appointment:   In Person  Provider:   K. Mali Hilty, MD or APP  Other Instructions  Dear Cassandra Adams,   You are scheduled for a Cardioversion on Friday 01/14/21 with Dr. Debara Pickett.  Please arrive at the [facility] (Main Entrance A) at [facility]: [address] [place], [place] [address] at 8:00 AM. (1 hour prior to procedure unless lab work is needed; if lab work is needed arrive 1.5 hours ahead)  DIET: Nothing to eat or drink after midnight except a sip of water with medications (see medication instructions below)  FYI: For your safety, and to allow [place] to monitor your vital signs accurately during the surgery/procedure we request that   if you have artificial nails, gel coating, SNS etc. Please have those removed prior to your surgery/procedure. Not having the nail coverings /polish removed may result in cancellation or delay of your surgery/procedure.   Medication Instructions: Hold Lasix the morning of procedure  Hold Insulin-Basaglar and Novolog the morning   Continue your anticoagulant: Eliquis You will need to continue your anticoagulant after your procedure until you  are told by your  Provider that it is safe to stop   Labs: If patient is on Coumadin, patient needs pt/INR, CBC, BMET within 3 days (No pt/INR needed for patients taking Xarelto, Eliquis, Pradaxa) For patients  receiving anesthesia for TEE and all Cardioversion patients: BMET, CBC within 1 week  Come to: [facility] in [place] located at [place] or [address] [employer] [facility]  or your lab work will be done at the hospital prior to your procedure - you will need to arrive 1  hours ahead of your procedure  You must have a responsible person to drive  you home and stay in the waiting area during your procedure. Failure to do so could result in cancellation.  Bring your insurance cards.  *Special Note: Every effort is made to have your procedure done on time. Occasionally there are emergencies that occur at the hospital that may cause delays. Please be patient if a delay does occur.

## 2021-01-05 NOTE — Progress Notes (Signed)
Cardiology Office Note:    Date:  01/08/2021   ID:  Cassandra Adams, Cassandra Adams 1946/03/10, MRN [id number]  PCP:  Sharilyn Sites, MD   [facility] Providers Cardiologist:  [name], MD {   Referring MD: Sharilyn Sites, MD   Chief Complaint  Patient presents with   Follow-up    Seen for Dr. Debara Pickett    History of Present Illness:    Cassandra Adams is a 75 y.o. female with a hx of HTN, HLD, DM2, CAD, PAF, CKD stage III and history of TIA.  Cardiac catheterization performed in July 2015 demonstrated occlusion of mid RCA, moderate LAD lesion was 50% stenosis in the proximal area followed by a 60 to 70% stenosis, normal left main and left circumflex artery, EF 60%.  Given lack of ongoing symptoms and preserved LV function, it was recommended to treat RCA occlusion medically.  Patient was last seen by Dr. Debara Pickett via virtual visit on 07/08/2020 at which time she was doing well.  Based on blood work, she had a significant vitamin D deficiency.  Her lisinopril was increased to 20 mg daily to help control the blood pressure.  She was also started on vitamin D 50,000 units weekly for 6 weeks and 2000 units daily thereafter.  Carotid Doppler revealed mild 1 to 39% stenosis in the left carotid artery, no evidence of stenosis in the right carotid artery.  1 year repeat study was recommended.  Since the last visit, patient was admitted in early May 2022 after having a fall at home from her recliner.  She suffered nasal contusion and passing out spell.  Maxillofacial CT did not show any fractures however did show a small nasal soft tissue laceration with small air-fluid level in the left maxillary and right sphenoid sinuses.  CT of cervical spine showed progressive severe degenerative disease at C6-7 however no acute fracture.  CT of the head showed no intracranial etiology.  Nosebleed was stopped in the emergency room with nasal packing.  EKG in the ED demonstrated recurrence of atrial fibrillation with  RVR.  Eliquis temporarily held however resumed the day after discharge.  There is a questionable occurrence of syncope during the fall, however this was felt to be vasovagal.  Echocardiogram obtained during the hospitalization showed EF 55 to 60%, moderate asymmetric LVH of basal septal segment, RVSP 27 mmHg, possible small to moderate pericardial effusion with no hemodynamic significance, mild MR.  More recently, patient was readmitted a few days after discharge in May 2022 after developing significant vomiting with coffee-ground emesis.  GI service was consulted during the second admission and patient underwent EGD on 5/16 that showed erosive gastropathy with no stigmata of bleeding.  It was recommended to start Protonix 40 mg twice a day for 12 weeks and also resume Eliquis.  I last saw the patient on 12/13/2020, unfortunately cardiology service was not consulted during her previous hospital admission in May.  Last dose of missed Eliquis was Sunday, 12/19/2020.  On physical exam, she had at least 2+ pitting edema with also mild to moderate degree of pleural effusion bilaterally given markedly diminished breath sound.  I increase her Lasix to 40 mg twice a day.  I also increased her carvedilol to 6.25 mg twice a day for better rate control.  Eventually her carvedilol was increased to 12.5 mg twice a day for better rate control.  Patient presents today for follow-up.  Lower extremity edema has significantly improved.  She continues to be in  atrial fibrillation with RVR with heart rate in the 120s despite increased dose of carvedilol.  I recommended proceed with cardioversion next week.  I discussed the case with DOD Dr. Sallyanne Kuster who is agreeable.  Past Medical History:  Diagnosis Date   Arthritis    "all over"   CAD (coronary artery disease)    a. NSTEMI 12/2013 - occluded dominant RCA with L-R collaterals, moderate prox segmental LAD disease, for medical therapy initially, EF 60% with subtle inferobasal  hypokinesia. Consider PCI for refractory CP.   CKD (chronic kidney disease), stage III ([place])    Hypertension    Migraines    "weekly" (01/06/2014)   Sickle cell trait ([place] )    TIA (transient ischemic attack) 1978   Type II diabetes mellitus ([place])    Vertigo     Past Surgical History:  Procedure Laterality Date   APPENDECTOMY  March 2014   had appendix frozen   CARDIAC CATHETERIZATION  "years ago" & 01/05/2014   CARDIOVERSION N/A 03/17/2014   Procedure: CARDIOVERSION;  Surgeon: [name], MD;  Location: [place];  Service: Cardiovascular;  Laterality: N/A;   CATARACT EXTRACTION W/ INTRAOCULAR LENS  IMPLANT, BILATERAL Bilateral 04/2013-05/2013   ENDARTERECTOMY Right 02/05/2017   Procedure: ENDARTERECTOMY CAROTID-RIGHT;  Surgeon: [name], MD;  Location: [place];  Service: Vascular;  Laterality: Right;   ESOPHAGOGASTRODUODENOSCOPY  11/08/2007    Normal esophagus without evidence of Barrett, mass, erosion/ Normal stomach, duodenal bulb   ESOPHAGOGASTRODUODENOSCOPY (EGD) WITH PROPOFOL N/A 11/08/2020   Procedure: ESOPHAGOGASTRODUODENOSCOPY (EGD) WITH PROPOFOL;  Surgeon: Thornton Park, MD;  Location: [place];  Service: Gastroenterology;  Laterality: N/A;   GASTRIC MOTILITY STUDY  11/13/2007   mildly delayed emptying subjectively, but normal  analysis-77% of tracer emptied at 2 hours   JOINT REPLACEMENT     LAPAROSCOPIC APPENDECTOMY N/A 09/15/2012   Procedure: APPENDECTOMY LAPAROSCOPIC;  Surgeon: [name], MD;  Location: [facility];  Service: General;  Laterality: N/A;   LEFT HEART CATHETERIZATION WITH CORONARY ANGIOGRAM N/A 01/05/2014   Procedure: LEFT HEART CATHETERIZATION WITH CORONARY ANGIOGRAM;  Surgeon: [name], MD;  Location: [facility] CATH LAB;  Service: Cardiovascular;  Laterality: N/A;   REPLACEMENT TOTAL KNEE Right 05-03-06   TEE WITHOUT CARDIOVERSION N/A 03/17/2014   Procedure: TRANSESOPHAGEAL ECHOCARDIOGRAM (TEE);  Surgeon: [name], MD;  Location:  [facility] ENDOSCOPY;  Service: Cardiovascular;  Laterality: N/A;   TONSILLECTOMY  1972    Current Medications: Current Meds  Medication Sig   aspirin EC 81 MG tablet Take 1 tablet (81 mg total) by mouth daily.   atorvastatin (LIPITOR) 80 MG tablet Take 1 tablet (80 mg total) by mouth every evening.   B-D ULTRAFINE III SHORT PEN 31G X 8 MM MISC SMARTSIG:Injection Daily   carvedilol (COREG) 12.5 MG tablet Take 1 tablet (12.5 mg total) by mouth 2 (two) times daily with a meal.   colesevelam (WELCHOL) 625 MG tablet Take 1,875 mg by mouth 2 (two) times daily with a meal. 1875mg  at noon and 1875mg  at night   ELIQUIS 5 MG TABS tablet Take 1 tablet (5 mg total) by mouth 2 (two) times daily.   furosemide (LASIX) 40 MG tablet Take 1 tablet (40 mg total) by mouth 2 (two) times daily.   hydrALAZINE (APRESOLINE) 25 MG tablet Take 25 mg by mouth 3 (three) times daily.   Insulin Glargine (BASAGLAR KWIKPEN) 100 UNIT/ML Inject 32 Units into the skin at bedtime.   lisinopril (ZESTRIL) 20 MG tablet Take 1 tablet (  20 mg total) by mouth daily.   nitroGLYCERIN (NITROSTAT) 0.4 MG SL tablet Place 1 tablet (0.4 mg total) under the tongue every 5 (five) minutes as needed for chest pain (up to 3 doses).   NOVOLOG 100 UNIT/ML injection Inject 6-9 Units into the skin 3 (three) times daily with meals.   ONETOUCH ULTRA test strip    pantoprazole (PROTONIX) 40 MG tablet Take 1 tablet (40 mg total) by mouth 2 (two) times daily.   polyethylene glycol (MIRALAX / GLYCOLAX) 17 g packet Take 17 g by mouth daily.   senna-docusate (SENOKOT-S) 8.6-50 MG tablet Take 1 tablet by mouth at bedtime as needed for mild constipation.   Vitamin D, Cholecalciferol, 25 MCG (1000 UT) TABS Take 2,000 Units by mouth See admin instructions. Every two days   [DISCONTINUED] BD INSULIN SYRINGE U/F 31G X 5/16" 0.5 ML MISC SMARTSIG:Injection 4 Times Daily     Allergies:   Penicillins   Social History   Socioeconomic History   Marital status: Single     Spouse name: Not on file   Number of children: Not on file   Years of education: Not on file   Highest education level: Not on file  Occupational History   Not on file  Tobacco Use   Smoking status: Never   Smokeless tobacco: Never  Vaping Use   Vaping Use: Never used  Substance and Sexual Activity   Alcohol use: No   Drug use: No   Sexual activity: Not Currently    Birth control/protection: Post-menopausal  Other Topics Concern   Not on file  Social History Narrative   Not on file   Social Determinants of Health   Financial Resource Strain: Not on file  Food Insecurity: Not on file  Transportation Needs: Not on file  Physical Activity: Not on file  Stress: Not on file  Social Connections: Not on file     Family History: The patient's family history includes Cancer in her father; Diabetes in her brother; Heart attack in her brother and mother; Heart disease in her brother; Hyperlipidemia in her brother and mother; Hypertension in her brother, mother, and sister; Other in her brother.  ROS:   Please see the history of present illness.     All other systems reviewed and are negative.  EKGs/Labs/Other Studies Reviewed:    The following studies were reviewed today:  Echo 11/06/2020  1. Left ventricular ejection fraction, by estimation, is 55 to 60%. The  left ventricle has normal function. The left ventricle demonstrates  regional wall motion abnormalities (see scoring diagram/findings for  description). There is moderate asymmetric  left ventricular hypertrophy of the basal and septal segments. Left  ventricular diastolic parameters are indeterminate.   2. Right ventricular systolic function is normal. The right ventricular  size is normal. There is normal pulmonary artery systolic pressure. The  estimated right ventricular systolic pressure is 56.4 mmHg.   3. Left atrial size was mild to moderately dilated.   4. Possible small to moderate pericardial fluid  collection at posterior  base of LV. No obvious hemodynamic significance.   5. The mitral valve is abnormal, mildly calcified with restricted  posterior leaflet motion. Mild mitral valve regurgitation.   6. The aortic valve is tricuspid. Aortic valve regurgitation is not  visualized. Mild to moderate aortic valve sclerosis/calcification is  present, without any evidence of aortic stenosis.   7. The inferior vena cava is normal in size with greater than 50%  respiratory variability, suggesting  right atrial pressure of 3 mmHg.   EKG:  EKG is ordered today.  The ekg ordered today demonstrates atrial fibrillation with RVR  Recent Labs: 06/09/2020: TSH 1.660 11/05/2020: ALT 23; Magnesium 1.9 01/07/2021: BUN 26; Creatinine, Ser 1.64; Hemoglobin 10.4; Platelets 184; Potassium 4.7; Sodium 141  Recent Lipid Panel    Component Value Date/Time   CHOL 128 06/09/2020 0802   TRIG 77 06/09/2020 0802   HDL 61 06/09/2020 0802   CHOLHDL 2.1 06/09/2020 0802   CHOLHDL 2.5 05/04/2019 2350   VLDL 20 05/04/2019 2350   LDLCALC 52 06/09/2020 0802     Risk Assessment/Calculations:    CHA2DS2-VASc Score = 8  This indicates a 10.8% annual risk of stroke. The patient's score is based upon: CHF History: Yes HTN History: Yes Diabetes History: Yes Stroke History: Yes Vascular Disease History: No Age Score: 2 Gender Score: 1          Physical Exam:    VS:  BP 128/70 (BP Location: Left Arm)   Pulse (!) 121   Ht 5\' 6"  (1.676 m)   Wt 184 lb (83.5 kg)   BMI 29.70 kg/m     Wt Readings from Last 3 Encounters:  01/05/21 184 lb (83.5 kg)  12/29/20 184 lb (83.5 kg)  12/21/20 184 lb (83.5 kg)     GEN:  Well nourished, well developed in no acute distress HEENT: Normal NECK: No JVD; No carotid bruits LYMPHATICS: No lymphadenopathy CARDIAC: Irregularly irregular, no murmurs, rubs, gallops RESPIRATORY:  Clear to auscultation without rales, wheezing or rhonchi  ABDOMEN: Soft, non-tender,  non-distended MUSCULOSKELETAL:  No edema; No deformity  SKIN: Warm and dry NEUROLOGIC:  Alert and oriented x 3 PSYCHIATRIC:  Normal affect   ASSESSMENT:    1. Atrial fibrillation with RVR ([place])   2. Preprocedural cardiovascular examination   3. Essential hypertension   4. Hyperlipidemia LDL goal <70   5. Controlled type 2 diabetes mellitus without complication, without long-term current use of insulin ([place])   6. CKD stage 3 due to type 2 diabetes mellitus ([facility])    PLAN:    In order of problems listed above:  Atrial fibrillation with RVR: Carvedilol was further uptitrated yesterday to 12.5 mg twice a day.  I discussed the case with DOD Dr. Sallyanne Kuster, patient would have completed uninterrupted anticoagulation by next week, will arrange outpatient cardioversion.  Heart failure symptoms significantly improved after the medication titration during the last office visit  Hypertension: Blood pressure stable  Hyperlipidemia: On Lipitor  DM2: Managed by primary care provider  CKD stage III: Stable on last lab work.  Repeat blood work today   Shared Decision Making/Informed Consent The risks (stroke, cardiac arrhythmias rarely resulting in the need for a temporary or permanent pacemaker, skin irritation or burns and complications associated with conscious sedation including aspiration, arrhythmia, respiratory failure and death), benefits (restoration of normal sinus rhythm) and alternatives of a direct current cardioversion were explained in detail to Cassandra Adams and she agrees to proceed.      Medication Adjustments/Labs and Tests Ordered: Current medicines are reviewed at length with the patient today.  Concerns regarding medicines are outlined above.  Orders Placed This Encounter  Procedures   Basic metabolic panel   CBC   EKG 12-Lead   No orders of the defined types were placed in this encounter.   Patient Instructions  Medication Instructions:  Your physician recommends  that you continue on your current medications as directed. Please refer to the Current  Medication list given to you today.  *If you need a refill on your cardiac medications before your next appointment, please call your pharmacy*  Lab Work: Your physician recommends that you return for lab work 1 week prior to Cardioversion:  BMET CBC If you have labs (blood work) drawn today and your tests are completely normal, you will receive your results only by: [place] (if you have MyChart) OR A paper copy in the mail If you have any lab test that is abnormal or we need to change your treatment, we will call you to review the results.  Testing/Procedures: Your physician has recommended that you have a Cardioversion (DCCV). Electrical Cardioversion uses a jolt of electricity to your heart either through paddles or wired patches attached to your chest. This is a controlled, usually prescheduled, procedure. Defibrillation is done under light anesthesia in the hospital, and you usually go home the day of the procedure. This is done to get your heart back into a normal rhythm. You are not awake for the procedure. Please see the instruction sheet given to you today.  Follow-Up: At [facility], you and your health needs are our priority.  As part of our continuing mission to provide you with exceptional heart care, we have created designated Provider Care Teams.  These Care Teams include your primary Cardiologist (physician) and Advanced Practice Providers (APPs -  Physician Assistants and Nurse Practitioners) who all work together to provide you with the care you need, when you need it.  We recommend signing up for the patient portal called "MyChart".  Sign up information is provided on this After Visit Summary.  MyChart is used to connect with patients for Virtual Visits (Telemedicine).  Patients are able to view lab/test results, encounter notes, upcoming appointments, etc.  Non-urgent messages can  be sent to your provider as well.   To learn more about what you can do with MyChart, go to [email or web address].    Your next appointment:   3-4 week(s)  The format for your next appointment:   In Person  Provider:   K. Mali Hilty, MD or APP  Other Instructions  Dear Cassandra Adams,   You are scheduled for a Cardioversion on Friday 01/14/21 with Dr. Debara Pickett.  Please arrive at the [facility] (Main Entrance A) at [facility]: [address] [place], [place] [address] at 8:00 AM. (1 hour prior to procedure unless lab work is needed; if lab work is needed arrive 1.5 hours ahead)  DIET: Nothing to eat or drink after midnight except a sip of water with medications (see medication instructions below)  FYI: For your safety, and to allow [place] to monitor your vital signs accurately during the surgery/procedure we request that   if you have artificial nails, gel coating, SNS etc. Please have those removed prior to your surgery/procedure. Not having the nail coverings /polish removed may result in cancellation or delay of your surgery/procedure.   Medication Instructions: Hold Lasix the morning of procedure  Hold Insulin-Basaglar and Novolog the morning   Continue your anticoagulant: Eliquis You will need to continue your anticoagulant after your procedure until you  are told by your  Provider that it is safe to stop   Labs: If patient is on Coumadin, patient needs pt/INR, CBC, BMET within 3 days (No pt/INR needed for patients taking Xarelto, Eliquis, Pradaxa) For patients receiving anesthesia for TEE and all Cardioversion patients: BMET, CBC within 1 week  Come to: [facility] in [place] located  at [place] or [place]  or your lab work will be done at the hospital prior to your procedure - you will need to arrive 1  hours ahead of your procedure  You must have a responsible person to drive you home and stay in the waiting area during your  procedure. Failure to do so could result in cancellation.  Bring your insurance cards.  *Special Note: Every effort is made to have your procedure done on time. Occasionally there are emergencies that occur at the hospital that may cause delays. Please be patient if a delay does occur.     Hilbert Corrigan, [place]  01/08/2021 12:27 AM    [facility]

## 2021-01-05 NOTE — H&P (View-Only) (Signed)
Cardiology Office Note:    Date:  01/08/2021   ID:  Cassandra Adams 12-21-1945, MRN [id number]  PCP:  Sharilyn Sites, MD   [facility] Providers Cardiologist:  [name], MD {   Referring MD: Sharilyn Sites, MD   Chief Complaint  Patient presents with   Follow-up    Seen for Dr. Debara Pickett    History of Present Illness:    Cassandra Adams is a 75 y.o. female with a hx of HTN, HLD, DM2, CAD, PAF, CKD stage III and history of TIA.  Cardiac catheterization performed in July 2015 demonstrated occlusion of mid RCA, moderate LAD lesion was 50% stenosis in the proximal area followed by a 60 to 70% stenosis, normal left main and left circumflex artery, EF 60%.  Given lack of ongoing symptoms and preserved LV function, it was recommended to treat RCA occlusion medically.  Patient was last seen by Dr. Debara Pickett via virtual visit on 07/08/2020 at which time she was doing well.  Based on blood work, she had a significant vitamin D deficiency.  Her lisinopril was increased to 20 mg daily to help control the blood pressure.  She was also started on vitamin D 50,000 units weekly for 6 weeks and 2000 units daily thereafter.  Carotid Doppler revealed mild 1 to 39% stenosis in the left carotid artery, no evidence of stenosis in the right carotid artery.  1 year repeat study was recommended.  Since the last visit, patient was admitted in early May 2022 after having a fall at home from her recliner.  She suffered nasal contusion and passing out spell.  Maxillofacial CT did not show any fractures however did show a small nasal soft tissue laceration with small air-fluid level in the left maxillary and right sphenoid sinuses.  CT of cervical spine showed progressive severe degenerative disease at C6-7 however no acute fracture.  CT of the head showed no intracranial etiology.  Nosebleed was stopped in the emergency room with nasal packing.  EKG in the ED demonstrated recurrence of atrial fibrillation with  RVR.  Eliquis temporarily held however resumed the day after discharge.  There is a questionable occurrence of syncope during the fall, however this was felt to be vasovagal.  Echocardiogram obtained during the hospitalization showed EF 55 to 60%, moderate asymmetric LVH of basal septal segment, RVSP 27 mmHg, possible small to moderate pericardial effusion with no hemodynamic significance, mild MR.  More recently, patient was readmitted a few days after discharge in May 2022 after developing significant vomiting with coffee-ground emesis.  GI service was consulted during the second admission and patient underwent EGD on 5/16 that showed erosive gastropathy with no stigmata of bleeding.  It was recommended to start Protonix 40 mg twice a day for 12 weeks and also resume Eliquis.  I last saw the patient on 12/13/2020, unfortunately cardiology service was not consulted during her previous hospital admission in May.  Last dose of missed Eliquis was Sunday, 12/19/2020.  On physical exam, she had at least 2+ pitting edema with also mild to moderate degree of pleural effusion bilaterally given markedly diminished breath sound.  I increase her Lasix to 40 mg twice a day.  I also increased her carvedilol to 6.25 mg twice a day for better rate control.  Eventually her carvedilol was increased to 12.5 mg twice a day for better rate control.  Patient presents today for follow-up.  Lower extremity edema has significantly improved.  She continues to be in  atrial fibrillation with RVR with heart rate in the 120s despite increased dose of carvedilol.  I recommended proceed with cardioversion next week.  I discussed the case with DOD Dr. Sallyanne Kuster who is agreeable.  Past Medical History:  Diagnosis Date   Arthritis    "all over"   CAD (coronary artery disease)    a. NSTEMI 12/2013 - occluded dominant RCA with L-R collaterals, moderate prox segmental LAD disease, for medical therapy initially, EF 60% with subtle inferobasal  hypokinesia. Consider PCI for refractory CP.   CKD (chronic kidney disease), stage III ([place])    Hypertension    Migraines    "weekly" (01/06/2014)   Sickle cell trait ([place])    TIA (transient ischemic attack) 1978   Type II diabetes mellitus ([place])    Vertigo     Past Surgical History:  Procedure Laterality Date   APPENDECTOMY  March 2014   had appendix frozen   CARDIAC CATHETERIZATION  "years ago" & 01/05/2014   CARDIOVERSION N/A 03/17/2014   Procedure: CARDIOVERSION;  Surgeon: [name], MD;  Location: [place];  Service: Cardiovascular;  Laterality: N/A;   CATARACT EXTRACTION W/ INTRAOCULAR LENS  IMPLANT, BILATERAL Bilateral 04/2013-05/2013   ENDARTERECTOMY Right 02/05/2017   Procedure: ENDARTERECTOMY CAROTID-RIGHT;  Surgeon: [name], MD;  Location: [place];  Service: Vascular;  Laterality: Right;   ESOPHAGOGASTRODUODENOSCOPY  11/08/2007    Normal esophagus without evidence of Barrett, mass, erosion/ Normal stomach, duodenal bulb   ESOPHAGOGASTRODUODENOSCOPY (EGD) WITH PROPOFOL N/A 11/08/2020   Procedure: ESOPHAGOGASTRODUODENOSCOPY (EGD) WITH PROPOFOL;  Surgeon: Thornton Park, MD;  Location: [place];  Service: Gastroenterology;  Laterality: N/A;   GASTRIC MOTILITY STUDY  11/13/2007   mildly delayed emptying subjectively, but normal  analysis-77% of tracer emptied at 2 hours   JOINT REPLACEMENT     LAPAROSCOPIC APPENDECTOMY N/A 09/15/2012   Procedure: APPENDECTOMY LAPAROSCOPIC;  Surgeon: [name], MD;  Location: [facility];  Service: General;  Laterality: N/A;   LEFT HEART CATHETERIZATION WITH CORONARY ANGIOGRAM N/A 01/05/2014   Procedure: LEFT HEART CATHETERIZATION WITH CORONARY ANGIOGRAM;  Surgeon: [name], MD;  Location: [facility] C[facility];  Service: Cardiovascular;  Laterality: N/A;   REPLACEMENT TOTAL KNEE Right 05-03-06   TEE WITHOUT CARDIOVERSION N/A 03/17/2014   Procedure: TRANSESOPHAGEAL ECHOCARDIOGRAM (TEE);  Surgeon: [name], MD;  Location:  [facility] ENDOSCOPY;  Service: Cardiovascular;  Laterality: N/A;   TONSILLECTOMY  1972    Current Medications: Current Meds  Medication Sig   aspirin EC 81 MG tablet Take 1 tablet (81 mg total) by mouth daily.   atorvastatin (LIPITOR) 80 MG tablet Take 1 tablet (80 mg total) by mouth every evening.   B-D ULTRAFINE III SHORT PEN 31G X 8 MM MISC SMARTSIG:Injection Daily   carvedilol (COREG) 12.5 MG tablet Take 1 tablet (12.5 mg total) by mouth 2 (two) times daily with a meal.   colesevelam (WELCHOL) 625 MG tablet Take 1,875 mg by mouth 2 (two) times daily with a meal. 1875mg  at noon and 1875mg  at night   ELIQUIS 5 MG TABS tablet Take 1 tablet (5 mg total) by mouth 2 (two) times daily.   furosemide (LASIX) 40 MG tablet Take 1 tablet (40 mg total) by mouth 2 (two) times daily.   hydrALAZINE (APRESOLINE) 25 MG tablet Take 25 mg by mouth 3 (three) times daily.   Insulin Glargine (BASAGLAR KWIKPEN) 100 UNIT/ML Inject 32 Units into the skin at bedtime.   lisinopril (ZESTRIL) 20 MG tablet Take 1 tablet (  20 mg total) by mouth daily.   nitroGLYCERIN (NITROSTAT) 0.4 MG SL tablet Place 1 tablet (0.4 mg total) under the tongue every 5 (five) minutes as needed for chest pain (up to 3 doses).   NOVOLOG 100 UNIT/ML injection Inject 6-9 Units into the skin 3 (three) times daily with meals.   ONETOUCH ULTRA test strip    pantoprazole (PROTONIX) 40 MG tablet Take 1 tablet (40 mg total) by mouth 2 (two) times daily.   polyethylene glycol (MIRALAX / GLYCOLAX) 17 g packet Take 17 g by mouth daily.   senna-docusate (SENOKOT-S) 8.6-50 MG tablet Take 1 tablet by mouth at bedtime as needed for mild constipation.   Vitamin D, Cholecalciferol, 25 MCG (1000 UT) TABS Take 2,000 Units by mouth See admin instructions. Every two days   [DISCONTINUED] BD INSULIN SYRINGE U/F 31G X 5/16" 0.5 ML MISC SMARTSIG:Injection 4 Times Daily     Allergies:   Penicillins   Social History   Socioeconomic History   Marital status: Single     Spouse name: Not on file   Number of children: Not on file   Years of education: Not on file   Highest education level: Not on file  Occupational History   Not on file  Tobacco Use   Smoking status: Never   Smokeless tobacco: Never  Vaping Use   Vaping Use: Never used  Substance and Sexual Activity   Alcohol use: No   Drug use: No   Sexual activity: Not Currently    Birth control/protection: Post-menopausal  Other Topics Concern   Not on file  Social History Narrative   Not on file   Social Determinants of Health   Financial Resource Strain: Not on file  Food Insecurity: Not on file  Transportation Needs: Not on file  Physical Activity: Not on file  Stress: Not on file  Social Connections: Not on file     Family History: The patient's family history includes Cancer in her father; Diabetes in her brother; Heart attack in her brother and mother; Heart disease in her brother; Hyperlipidemia in her brother and mother; Hypertension in her brother, mother, and sister; Other in her brother.  ROS:   Please see the history of present illness.     All other systems reviewed and are negative.  EKGs/Labs/Other Studies Reviewed:    The following studies were reviewed today:  Echo 11/06/2020  1. Left ventricular ejection fraction, by estimation, is 55 to 60%. The  left ventricle has normal function. The left ventricle demonstrates  regional wall motion abnormalities (see scoring diagram/findings for  description). There is moderate asymmetric  left ventricular hypertrophy of the basal and septal segments. Left  ventricular diastolic parameters are indeterminate.   2. Right ventricular systolic function is normal. The right ventricular  size is normal. There is normal pulmonary artery systolic pressure. The  estimated right ventricular systolic pressure is 01.0 mmHg.   3. Left atrial size was mild to moderately dilated.   4. Possible small to moderate pericardial fluid  collection at posterior  base of LV. No obvious hemodynamic significance.   5. The mitral valve is abnormal, mildly calcified with restricted  posterior leaflet motion. Mild mitral valve regurgitation.   6. The aortic valve is tricuspid. Aortic valve regurgitation is not  visualized. Mild to moderate aortic valve sclerosis/calcification is  present, without any evidence of aortic stenosis.   7. The inferior vena cava is normal in size with greater than 50%  respiratory variability, suggesting  right atrial pressure of 3 mmHg.   EKG:  EKG is ordered today.  The ekg ordered today demonstrates atrial fibrillation with RVR  Recent Labs: 06/09/2020: TSH 1.660 11/05/2020: ALT 23; Magnesium 1.9 01/07/2021: BUN 26; Creatinine, Ser 1.64; Hemoglobin 10.4; Platelets 184; Potassium 4.7; Sodium 141  Recent Lipid Panel    Component Value Date/Time   CHOL 128 06/09/2020 0802   TRIG 77 06/09/2020 0802   HDL 61 06/09/2020 0802   CHOLHDL 2.1 06/09/2020 0802   CHOLHDL 2.5 05/04/2019 2350   VLDL 20 05/04/2019 2350   LDLCALC 52 06/09/2020 0802     Risk Assessment/Calculations:    CHA2DS2-VASc Score = 8  This indicates a 10.8% annual risk of stroke. The patient's score is based upon: CHF History: Yes HTN History: Yes Diabetes History: Yes Stroke History: Yes Vascular Disease History: No Age Score: 2 Gender Score: 1          Physical Exam:    VS:  BP 128/70 (BP Location: Left Arm)   Pulse (!) 121   Ht 5\' 6"  (1.676 m)   Wt 184 lb (83.5 kg)   BMI 29.70 kg/m     Wt Readings from Last 3 Encounters:  01/05/21 184 lb (83.5 kg)  12/29/20 184 lb (83.5 kg)  12/21/20 184 lb (83.5 kg)     GEN:  Well nourished, well developed in no acute distress HEENT: Normal NECK: No JVD; No carotid bruits LYMPHATICS: No lymphadenopathy CARDIAC: Irregularly irregular, no murmurs, rubs, gallops RESPIRATORY:  Clear to auscultation without rales, wheezing or rhonchi  ABDOMEN: Soft, non-tender,  non-distended MUSCULOSKELETAL:  No edema; No deformity  SKIN: Warm and dry NEUROLOGIC:  Alert and oriented x 3 PSYCHIATRIC:  Normal affect   ASSESSMENT:    1. Atrial fibrillation with RVR ([place])   2. Preprocedural cardiovascular examination   3. Essential hypertension   4. Hyperlipidemia LDL goal <70   5. Controlled type 2 diabetes mellitus without complication, without long-term current use of insulin ([place])   6. CKD stage 3 due to type 2 diabetes mellitus (HCC)    PLAN:    In order of problems listed above:  Atrial fibrillation with RVR: Carvedilol was further uptitrated yesterday to 12.5 mg twice a day.  I discussed the case with DOD Dr. Sallyanne Kuster, patient would have completed uninterrupted anticoagulation by next week, will arrange outpatient cardioversion.  Heart failure symptoms significantly improved after the medication titration during the last office visit  Hypertension: Blood pressure stable  Hyperlipidemia: On Lipitor  DM2: Managed by primary care provider  CKD stage III: Stable on last lab work.  Repeat blood work today   Shared Decision Making/Informed Consent The risks (stroke, cardiac arrhythmias rarely resulting in the need for a temporary or permanent pacemaker, skin irritation or burns and complications associated with conscious sedation including aspiration, arrhythmia, respiratory failure and death), benefits (restoration of normal sinus rhythm) and alternatives of a direct current cardioversion were explained in detail to Ms. Minks and she agrees to proceed.      Medication Adjustments/Labs and Tests Ordered: Current medicines are reviewed at length with the patient today.  Concerns regarding medicines are outlined above.  Orders Placed This Encounter  Procedures   Basic metabolic panel   CBC   EKG 12-Lead   No orders of the defined types were placed in this encounter.   Patient Instructions  Medication Instructions:  Your physician recommends  that you continue on your current medications as directed. Please refer to the Current  Medication list given to you today.  *If you need a refill on your cardiac medications before your next appointment, please call your pharmacy*  Lab Work: Your physician recommends that you return for lab work 1 week prior to Cardioversion:  BMET CBC If you have labs (blood work) drawn today and your tests are completely normal, you will receive your results only by: [place] (if you have MyChart) OR A paper copy in the mail If you have any lab test that is abnormal or we need to change your treatment, we will call you to review the results.  Testing/Procedures: Your physician has recommended that you have a Cardioversion (DCCV). Electrical Cardioversion uses a jolt of electricity to your heart either through paddles or wired patches attached to your chest. This is a controlled, usually prescheduled, procedure. Defibrillation is done under light anesthesia in the hospital, and you usually go home the day of the procedure. This is done to get your heart back into a normal rhythm. You are not awake for the procedure. Please see the instruction sheet given to you today.  Follow-Up: At [facility], you and your health needs are our priority.  As part of our continuing mission to provide you with exceptional heart care, we have created designated Provider Care Teams.  These Care Teams include your primary Cardiologist (physician) and Advanced Practice Providers (APPs -  Physician Assistants and Nurse Practitioners) who all work together to provide you with the care you need, when you need it.  We recommend signing up for the patient portal called "MyChart".  Sign up information is provided on this After Visit Summary.  MyChart is used to connect with patients for Virtual Visits (Telemedicine).  Patients are able to view lab/test results, encounter notes, upcoming appointments, etc.  Non-urgent messages can  be sent to your provider as well.   To learn more about what you can do with MyChart, go to [email or web address].    Your next appointment:   3-4 week(s)  The format for your next appointment:   In Person  Provider:   K. Mali Hilty, MD or APP  Other Instructions  Dear Cassandra Adams,   You are scheduled for a Cardioversion on Friday 01/14/21 with Dr. Debara Pickett.  Please arrive at the [facility] (Main Entrance A) at [facility]: [address] [place], [place] [address] at 8:00 AM. (1 hour prior to procedure unless lab work is needed; if lab work is needed arrive 1.5 hours ahead)  DIET: Nothing to eat or drink after midnight except a sip of water with medications (see medication instructions below)  FYI: For your safety, and to allow [place] to monitor your vital signs accurately during the surgery/procedure we request that   if you have artificial nails, gel coating, SNS etc. Please have those removed prior to your surgery/procedure. Not having the nail coverings /polish removed may result in cancellation or delay of your surgery/procedure.   Medication Instructions: Hold Lasix the morning of procedure  Hold Insulin-Basaglar and Novolog the morning   Continue your anticoagulant: Eliquis You will need to continue your anticoagulant after your procedure until you  are told by your  Provider that it is safe to stop   Labs: If patient is on Coumadin, patient needs pt/INR, CBC, BMET within 3 days (No pt/INR needed for patients taking Xarelto, Eliquis, Pradaxa) For patients receiving anesthesia for TEE and all Cardioversion patients: BMET, CBC within 1 week  Come to: [facility] in [place] located  at [place] or [place]  or your lab work will be done at the hospital prior to your procedure - you will need to arrive 1  hours ahead of your procedure  You must have a responsible person to drive you home and stay in the waiting area during your  procedure. Failure to do so could result in cancellation.  Bring your insurance cards.  *Special Note: Every effort is made to have your procedure done on time. Occasionally there are emergencies that occur at the hospital that may cause delays. Please be patient if a delay does occur.     Hilbert Corrigan, [place]  01/08/2021 12:27 AM    [facility]

## 2021-01-07 ENCOUNTER — Other Ambulatory Visit: Payer: Self-pay

## 2021-01-07 DIAGNOSIS — Z01810 Encounter for preprocedural cardiovascular examination: Secondary | ICD-10-CM | POA: Diagnosis not present

## 2021-01-07 LAB — CBC
Hematocrit: 32.1 % — ABNORMAL LOW (ref 34.0–46.6)
Hemoglobin: 10.4 g/dL — ABNORMAL LOW (ref 11.1–15.9)
MCH: 26.1 pg — ABNORMAL LOW (ref 26.6–33.0)
MCHC: 32.4 g/dL (ref 31.5–35.7)
MCV: 81 fL (ref 79–97)
Platelets: 184 10*3/uL (ref 150–450)
RBC: 3.98 x10E6/uL (ref 3.77–5.28)
RDW: 14.2 % (ref 11.7–15.4)
WBC: 4.4 10*3/uL (ref 3.4–10.8)

## 2021-01-07 LAB — BASIC METABOLIC PANEL
BUN/Creatinine Ratio: 16 (ref 12–28)
BUN: 26 mg/dL (ref 8–27)
CO2: 23 mmol/L (ref 20–29)
Calcium: 10 mg/dL (ref 8.7–10.3)
Chloride: 105 mmol/L (ref 96–106)
Creatinine, Ser: 1.64 mg/dL — ABNORMAL HIGH (ref 0.57–1.00)
Glucose: 84 mg/dL (ref 65–99)
Potassium: 4.7 mmol/L (ref 3.5–5.2)
Sodium: 141 mmol/L (ref 134–144)
eGFR: 32 mL/min/{1.73_m2} — ABNORMAL LOW (ref >59)

## 2021-01-07 MED ORDER — "BD INSULIN SYRINGE U/F 31G X 5/16"" 0.5 ML MISC": 5 refills | Status: DC

## 2021-01-08 ENCOUNTER — Encounter: Payer: Self-pay | Admitting: Physician Assistant

## 2021-01-10 ENCOUNTER — Telehealth: Payer: Self-pay | Admitting: *Deleted

## 2021-01-10 MED ORDER — FUROSEMIDE 40 MG PO TABS: ORAL_TABLET | ORAL | 3 refills | Status: DC

## 2021-01-10 NOTE — Telephone Encounter (Signed)
The patient 's daughter Arnette Felts has been notified of the result and verbalized understanding.  All questions (if any) were answered. Aware to change 40 mg daily lasix ,may take extra 40 mg  additional if needed- leg edema worsening cahnged medication list  Raiford Simmonds RN 01/10/2021 6:13 PM

## 2021-01-10 NOTE — Telephone Encounter (Signed)
-----   Message from [place], [place] sent at 01/10/2021  4:24 PM EDT ----- Renal function declined slightly, reduce lasix to 40mg  daily, with additional 40mg  as needed for worsening leg edema.

## 2021-01-14 ENCOUNTER — Other Ambulatory Visit: Payer: Self-pay

## 2021-01-14 ENCOUNTER — Encounter (HOSPITAL_COMMUNITY): Payer: Self-pay | Admitting: Internal Medicine

## 2021-01-14 ENCOUNTER — Ambulatory Visit (HOSPITAL_COMMUNITY): Payer: Medicare HMO | Admitting: Certified Registered Nurse Anesthetist

## 2021-01-14 ENCOUNTER — Encounter (HOSPITAL_COMMUNITY)
Admission: RE | Disposition: A | Discharge status: Home / Self Care | Payer: Self-pay | Source: Home / Self Care | Attending: Internal Medicine

## 2021-01-14 ENCOUNTER — Ambulatory Visit (HOSPITAL_BASED_OUTPATIENT_CLINIC_OR_DEPARTMENT_OTHER)
Admission: RE | Admit: 2021-01-14 | Discharge: 2021-01-14 | Disposition: A | Discharge status: Home / Self Care | Payer: Medicare HMO | Source: Home / Self Care | Attending: Internal Medicine | Admitting: Internal Medicine

## 2021-01-14 DIAGNOSIS — I252 Old myocardial infarction: Secondary | ICD-10-CM | POA: Insufficient documentation

## 2021-01-14 DIAGNOSIS — I4819 Other persistent atrial fibrillation: Secondary | ICD-10-CM | POA: Diagnosis not present

## 2021-01-14 DIAGNOSIS — N1831 Chronic kidney disease, stage 3a: Secondary | ICD-10-CM | POA: Diagnosis not present

## 2021-01-14 DIAGNOSIS — N1832 Chronic kidney disease, stage 3b: Secondary | ICD-10-CM | POA: Diagnosis not present

## 2021-01-14 DIAGNOSIS — I10 Essential (primary) hypertension: Secondary | ICD-10-CM | POA: Insufficient documentation

## 2021-01-14 DIAGNOSIS — I5033 Acute on chronic diastolic (congestive) heart failure: Secondary | ICD-10-CM | POA: Diagnosis not present

## 2021-01-14 DIAGNOSIS — I248 Other forms of acute ischemic heart disease: Secondary | ICD-10-CM | POA: Diagnosis not present

## 2021-01-14 DIAGNOSIS — I129 Hypertensive chronic kidney disease with stage 1 through stage 4 chronic kidney disease, or unspecified chronic kidney disease: Secondary | ICD-10-CM | POA: Diagnosis not present

## 2021-01-14 DIAGNOSIS — N179 Acute kidney failure, unspecified: Secondary | ICD-10-CM | POA: Diagnosis not present

## 2021-01-14 DIAGNOSIS — J9601 Acute respiratory failure with hypoxia: Secondary | ICD-10-CM | POA: Diagnosis not present

## 2021-01-14 DIAGNOSIS — I2510 Atherosclerotic heart disease of native coronary artery without angina pectoris: Secondary | ICD-10-CM | POA: Insufficient documentation

## 2021-01-14 DIAGNOSIS — Z20822 Contact with and (suspected) exposure to covid-19: Secondary | ICD-10-CM | POA: Diagnosis not present

## 2021-01-14 DIAGNOSIS — I4891 Unspecified atrial fibrillation: Secondary | ICD-10-CM | POA: Insufficient documentation

## 2021-01-14 DIAGNOSIS — I130 Hypertensive heart and chronic kidney disease with heart failure and stage 1 through stage 4 chronic kidney disease, or unspecified chronic kidney disease: Secondary | ICD-10-CM | POA: Diagnosis not present

## 2021-01-14 DIAGNOSIS — Z794 Long term (current) use of insulin: Secondary | ICD-10-CM | POA: Diagnosis not present

## 2021-01-14 DIAGNOSIS — D509 Iron deficiency anemia, unspecified: Secondary | ICD-10-CM | POA: Diagnosis not present

## 2021-01-14 DIAGNOSIS — E1122 Type 2 diabetes mellitus with diabetic chronic kidney disease: Secondary | ICD-10-CM | POA: Diagnosis not present

## 2021-01-14 DIAGNOSIS — I480 Paroxysmal atrial fibrillation: Secondary | ICD-10-CM | POA: Diagnosis not present

## 2021-01-14 DIAGNOSIS — J810 Acute pulmonary edema: Secondary | ICD-10-CM | POA: Diagnosis not present

## 2021-01-14 HISTORY — PX: CARDIOVERSION: SHX1299

## 2021-01-14 SURGERY — CARDIOVERSION
Anesthesia: General

## 2021-01-14 MED ORDER — PROPOFOL 10 MG/ML IV BOLUS
INTRAVENOUS | Status: DC | PRN
  Administered 2021-01-14: 50 mg via INTRAVENOUS

## 2021-01-14 MED ORDER — EPHEDRINE SULFATE-NACL 50-0.9 MG/10ML-% IV SOSY
PREFILLED_SYRINGE | INTRAVENOUS | Status: DC | PRN
  Administered 2021-01-14: 5 mg via INTRAVENOUS

## 2021-01-14 MED ORDER — LIDOCAINE 2% (20 MG/ML) 5 ML SYRINGE
INTRAMUSCULAR | Status: DC | PRN
  Administered 2021-01-14: 40 mg via INTRAVENOUS

## 2021-01-14 MED ORDER — SODIUM CHLORIDE 0.9 % IV SOLN: INTRAVENOUS | Status: DC | PRN

## 2021-01-14 MED ORDER — CARVEDILOL 12.5 MG PO TABS: 6.2500 mg | ORAL_TABLET | Freq: Two times a day (BID) | ORAL | 2 refills | Status: DC

## 2021-01-14 NOTE — Interval H&P Note (Signed)
History and Physical Interval Note:  01/14/2021 8:22 AM  Cassandra Adams  has presented today for surgery, with the diagnosis of AFIB.  The various methods of treatment have been discussed with the patient and family. After consideration of risks, benefits and other options for treatment, the patient has consented to  Procedure(s): CARDIOVERSION (N/A) as a surgical intervention.  The patient's history has been reviewed, patient examined, no change in status, stable for surgery.  I have reviewed the patient's chart and labs.  Questions were answered to the patient's satisfaction.     [name]

## 2021-01-14 NOTE — Transfer of Care (Signed)
Immediate Anesthesia Transfer of Care Note  Patient: Cassandra Adams  Procedure(s) Performed: CARDIOVERSION  Patient Location: Endoscopy Unit  Anesthesia Type:General  Level of Consciousness: awake, alert  and oriented  Airway & Oxygen Therapy: Patient Spontanous Breathing  Post-op Assessment: Report given to RN and Post -op Vital signs reviewed and stable  Post vital signs: Reviewed and stable  Last Vitals:  Vitals Value Taken Time  BP 115/49   Temp    Pulse 52   Resp 12   SpO2 100     Last Pain:  Vitals:   01/14/21 0812  TempSrc: Oral  PainSc: 0-No pain         Complications: No notable events documented.

## 2021-01-14 NOTE — Anesthesia Procedure Notes (Signed)
Procedure Name: General with mask airway Date/Time: 01/14/2021 9:12 AM Performed by: Dorthea Cove, CRNA Pre-anesthesia Checklist: Patient identified, Emergency Drugs available, Suction available, Patient being monitored and Timeout performed Patient Re-evaluated:Patient Re-evaluated prior to induction Oxygen Delivery Method: Ambu bag Preoxygenation: Pre-oxygenation with 100% oxygen Induction Type: IV induction Ventilation: Mask ventilation without difficulty Placement Confirmation: positive ETCO2

## 2021-01-14 NOTE — Anesthesia Postprocedure Evaluation (Signed)
Anesthesia Post Note  Patient: Cassandra Adams  Procedure(s) Performed: CARDIOVERSION     Patient location during evaluation: Endoscopy Anesthesia Type: General Level of consciousness: awake Pain management: pain level controlled Vital Signs Assessment: post-procedure vital signs reviewed and stable Respiratory status: spontaneous breathing Cardiovascular status: stable Postop Assessment: no apparent nausea or vomiting Anesthetic complications: no   No notable events documented.  Last Vitals:  Vitals:   01/14/21 0812 01/14/21 0933  BP: (!) 163/67 (!) 115/49  Pulse: 81 (!) 50  Resp: (!) 22 17  Temp: 36.7 C   SpO2: 100% 100%    Last Pain:  Vitals:   01/14/21 0933  TempSrc:   PainSc: 0-No pain                 Emmerich Cryer

## 2021-01-14 NOTE — Anesthesia Preprocedure Evaluation (Signed)
Anesthesia Evaluation  Patient identified by MRN, date of birth, ID band Patient awake    Reviewed: Allergy & Precautions, NPO status , Patient's Chart, lab work & pertinent test results  Airway Mallampati: II  TM Distance: >3 FB     Dental   Pulmonary    breath sounds clear to auscultation       Cardiovascular hypertension, + CAD and + Past MI   Rhythm:Irregular Rate:Normal     Neuro/Psych  Headaches, TIA   GI/Hepatic Neg liver ROS, GERD  ,  Endo/Other  diabetes  Renal/GU Renal disease     Musculoskeletal   Abdominal   Peds  Hematology   Anesthesia Other Findings   Reproductive/Obstetrics                             Anesthesia Physical Anesthesia Plan  ASA: 3  Anesthesia Plan: General   Post-op Pain Management:    Induction: Intravenous  PONV Risk Score and Plan: 3 and Treatment may vary due to age or medical condition  Airway Management Planned: Nasal Cannula and Simple Face Mask  Additional Equipment:   Intra-op Plan:   Post-operative Plan:   Informed Consent: I have reviewed the patients History and Physical, chart, labs and discussed the procedure including the risks, benefits and alternatives for the proposed anesthesia with the patient or authorized representative who has indicated his/her understanding and acceptance.     Dental advisory given  Plan Discussed with: CRNA and Anesthesiologist  Anesthesia Plan Comments:         Anesthesia Quick Evaluation

## 2021-01-14 NOTE — CV Procedure (Signed)
   CARDIOVERSION NOTE  Procedure: Electrical Cardioversion Indications:  Atrial Fibrillation  Procedure Details:  Consent: Risks of procedure as well as the alternatives and risks of each were explained to the (patient/caregiver).  Consent for procedure obtained.  Time Out: Verified patient identification, verified procedure, site/side was marked, verified correct patient position, special equipment/implants available, medications/allergies/relevent history reviewed, required imaging and test results available.  Performed  Patient placed on cardiac monitor, pulse oximetry, supplemental oxygen as necessary.  Sedation given:  propofol per anesthesia Pacer pads placed anterior and posterior chest.  Cardioverted 1 time(s).  Cardioverted at 150Jbiphasic.  Impression: Findings: Post procedure EKG shows: NSR Complications: None Patient did tolerate procedure well.  Plan: Successful DCCV with a single 150J biphasic shock. Would continue previous dose carvedilol of 6.25 mg BID.  Time Spent Directly with the Patient:  30 minutes   Cassandra Casino, MD, [facility], [place] Director of the Advanced Lipid Disorders &  Cardiovascular Risk Reduction Clinic Diplomate of the American Board of Clinical Lipidology Attending Cardiologist  Direct Dial: [phone]  Fax: [phone]  Website:  [email or web address] 01/14/2021, 9:30 AM

## 2021-01-14 NOTE — Discharge Instructions (Signed)

## 2021-01-16 ENCOUNTER — Other Ambulatory Visit: Payer: Self-pay

## 2021-01-16 ENCOUNTER — Inpatient Hospital Stay (HOSPITAL_COMMUNITY)
Admission: EM | Admit: 2021-01-16 | Discharge: 2021-01-18 | DRG: 308 | Disposition: A | Discharge status: Home Health | Payer: Medicare HMO | Attending: Family Medicine | Admitting: Family Medicine

## 2021-01-16 ENCOUNTER — Emergency Department (HOSPITAL_COMMUNITY): Payer: Medicare HMO

## 2021-01-16 ENCOUNTER — Encounter (HOSPITAL_COMMUNITY): Payer: Self-pay

## 2021-01-16 DIAGNOSIS — Z7984 Long term (current) use of oral hypoglycemic drugs: Secondary | ICD-10-CM

## 2021-01-16 DIAGNOSIS — Z20822 Contact with and (suspected) exposure to covid-19: Secondary | ICD-10-CM | POA: Diagnosis present

## 2021-01-16 DIAGNOSIS — E1122 Type 2 diabetes mellitus with diabetic chronic kidney disease: Secondary | ICD-10-CM | POA: Diagnosis not present

## 2021-01-16 DIAGNOSIS — R7989 Other specified abnormal findings of blood chemistry: Secondary | ICD-10-CM | POA: Diagnosis not present

## 2021-01-16 DIAGNOSIS — Z880 Allergy status to penicillin: Secondary | ICD-10-CM

## 2021-01-16 DIAGNOSIS — Z8249 Family history of ischemic heart disease and other diseases of the circulatory system: Secondary | ICD-10-CM | POA: Diagnosis not present

## 2021-01-16 DIAGNOSIS — I160 Hypertensive urgency: Secondary | ICD-10-CM | POA: Diagnosis not present

## 2021-01-16 DIAGNOSIS — I480 Paroxysmal atrial fibrillation: Secondary | ICD-10-CM | POA: Diagnosis not present

## 2021-01-16 DIAGNOSIS — N179 Acute kidney failure, unspecified: Secondary | ICD-10-CM | POA: Diagnosis present

## 2021-01-16 DIAGNOSIS — Z79899 Other long term (current) drug therapy: Secondary | ICD-10-CM

## 2021-01-16 DIAGNOSIS — D509 Iron deficiency anemia, unspecified: Secondary | ICD-10-CM | POA: Diagnosis present

## 2021-01-16 DIAGNOSIS — D573 Sickle-cell trait: Secondary | ICD-10-CM | POA: Diagnosis present

## 2021-01-16 DIAGNOSIS — I248 Other forms of acute ischemic heart disease: Secondary | ICD-10-CM | POA: Diagnosis not present

## 2021-01-16 DIAGNOSIS — R0603 Acute respiratory distress: Secondary | ICD-10-CM | POA: Diagnosis not present

## 2021-01-16 DIAGNOSIS — N1832 Chronic kidney disease, stage 3b: Secondary | ICD-10-CM | POA: Diagnosis not present

## 2021-01-16 DIAGNOSIS — I4819 Other persistent atrial fibrillation: Secondary | ICD-10-CM | POA: Diagnosis not present

## 2021-01-16 DIAGNOSIS — I5033 Acute on chronic diastolic (congestive) heart failure: Secondary | ICD-10-CM | POA: Diagnosis not present

## 2021-01-16 DIAGNOSIS — E785 Hyperlipidemia, unspecified: Secondary | ICD-10-CM | POA: Diagnosis present

## 2021-01-16 DIAGNOSIS — Z83438 Family history of other disorder of lipoprotein metabolism and other lipidemia: Secondary | ICD-10-CM | POA: Diagnosis not present

## 2021-01-16 DIAGNOSIS — I2510 Atherosclerotic heart disease of native coronary artery without angina pectoris: Secondary | ICD-10-CM | POA: Diagnosis not present

## 2021-01-16 DIAGNOSIS — E559 Vitamin D deficiency, unspecified: Secondary | ICD-10-CM | POA: Diagnosis present

## 2021-01-16 DIAGNOSIS — N189 Chronic kidney disease, unspecified: Secondary | ICD-10-CM | POA: Diagnosis not present

## 2021-01-16 DIAGNOSIS — J810 Acute pulmonary edema: Secondary | ICD-10-CM

## 2021-01-16 DIAGNOSIS — R0602 Shortness of breath: Secondary | ICD-10-CM | POA: Diagnosis not present

## 2021-01-16 DIAGNOSIS — M1990 Unspecified osteoarthritis, unspecified site: Secondary | ICD-10-CM | POA: Diagnosis present

## 2021-01-16 DIAGNOSIS — E1165 Type 2 diabetes mellitus with hyperglycemia: Secondary | ICD-10-CM | POA: Diagnosis not present

## 2021-01-16 DIAGNOSIS — N183 Chronic kidney disease, stage 3 unspecified: Secondary | ICD-10-CM

## 2021-01-16 DIAGNOSIS — N184 Chronic kidney disease, stage 4 (severe): Secondary | ICD-10-CM | POA: Diagnosis present

## 2021-01-16 DIAGNOSIS — I130 Hypertensive heart and chronic kidney disease with heart failure and stage 1 through stage 4 chronic kidney disease, or unspecified chronic kidney disease: Secondary | ICD-10-CM | POA: Diagnosis present

## 2021-01-16 DIAGNOSIS — J9601 Acute respiratory failure with hypoxia: Secondary | ICD-10-CM | POA: Diagnosis not present

## 2021-01-16 DIAGNOSIS — Z833 Family history of diabetes mellitus: Secondary | ICD-10-CM

## 2021-01-16 DIAGNOSIS — Z8673 Personal history of transient ischemic attack (TIA), and cerebral infarction without residual deficits: Secondary | ICD-10-CM | POA: Diagnosis not present

## 2021-01-16 DIAGNOSIS — Z7901 Long term (current) use of anticoagulants: Secondary | ICD-10-CM | POA: Diagnosis not present

## 2021-01-16 DIAGNOSIS — Z7982 Long term (current) use of aspirin: Secondary | ICD-10-CM

## 2021-01-16 DIAGNOSIS — I2583 Coronary atherosclerosis due to lipid rich plaque: Secondary | ICD-10-CM | POA: Diagnosis not present

## 2021-01-16 DIAGNOSIS — I252 Old myocardial infarction: Secondary | ICD-10-CM

## 2021-01-16 DIAGNOSIS — Z96651 Presence of right artificial knee joint: Secondary | ICD-10-CM | POA: Diagnosis present

## 2021-01-16 DIAGNOSIS — R062 Wheezing: Secondary | ICD-10-CM | POA: Diagnosis not present

## 2021-01-16 DIAGNOSIS — E0590 Thyrotoxicosis, unspecified without thyrotoxic crisis or storm: Secondary | ICD-10-CM | POA: Diagnosis present

## 2021-01-16 DIAGNOSIS — I5031 Acute diastolic (congestive) heart failure: Secondary | ICD-10-CM | POA: Diagnosis not present

## 2021-01-16 DIAGNOSIS — I509 Heart failure, unspecified: Secondary | ICD-10-CM | POA: Insufficient documentation

## 2021-01-16 DIAGNOSIS — R778 Other specified abnormalities of plasma proteins: Secondary | ICD-10-CM | POA: Diagnosis not present

## 2021-01-16 DIAGNOSIS — Z794 Long term (current) use of insulin: Secondary | ICD-10-CM

## 2021-01-16 DIAGNOSIS — E782 Mixed hyperlipidemia: Secondary | ICD-10-CM | POA: Diagnosis not present

## 2021-01-16 DIAGNOSIS — R0789 Other chest pain: Secondary | ICD-10-CM | POA: Diagnosis not present

## 2021-01-16 LAB — CBC WITH DIFFERENTIAL/PLATELET
Abs Immature Granulocytes: 0.02 10*3/uL (ref 0.00–0.07)
Basophils Absolute: 0 10*3/uL (ref 0.0–0.1)
Basophils Relative: 0 %
Eosinophils Absolute: 0.2 10*3/uL (ref 0.0–0.5)
Eosinophils Relative: 3 %
HCT: 31.4 % — ABNORMAL LOW (ref 36.0–46.0)
Hemoglobin: 10.4 g/dL — ABNORMAL LOW (ref 12.0–15.0)
Immature Granulocytes: 0 %
Lymphocytes Relative: 13 %
Lymphs Abs: 0.9 10*3/uL (ref 0.7–4.0)
MCH: 26.1 pg (ref 26.0–34.0)
MCHC: 33.1 g/dL (ref 30.0–36.0)
MCV: 78.7 fL — ABNORMAL LOW (ref 80.0–100.0)
Monocytes Absolute: 0.6 10*3/uL (ref 0.1–1.0)
Monocytes Relative: 8 %
Neutro Abs: 5.1 10*3/uL (ref 1.7–7.7)
Neutrophils Relative %: 76 %
Platelets: 184 10*3/uL (ref 150–400)
RBC: 3.99 MIL/uL (ref 3.87–5.11)
RDW: 14.4 % (ref 11.5–15.5)
WBC: 6.8 10*3/uL (ref 4.0–10.5)
nRBC: 0 % (ref 0.0–0.2)

## 2021-01-16 LAB — PROTIME-INR
INR: 1.3 — ABNORMAL HIGH (ref 0.8–1.2)
Prothrombin Time: 16.4 seconds — ABNORMAL HIGH (ref 11.4–15.2)

## 2021-01-16 LAB — BASIC METABOLIC PANEL
Anion gap: 6 (ref 5–15)
BUN: 19 mg/dL (ref 8–23)
CO2: 24 mmol/L (ref 22–32)
Calcium: 9.8 mg/dL (ref 8.9–10.3)
Chloride: 107 mmol/L (ref 98–111)
Creatinine, Ser: 1.62 mg/dL — ABNORMAL HIGH (ref 0.44–1.00)
GFR, Estimated: 33 mL/min — ABNORMAL LOW (ref >60)
Glucose, Bld: 272 mg/dL — ABNORMAL HIGH (ref 70–99)
Potassium: 4.4 mmol/L (ref 3.5–5.1)
Sodium: 137 mmol/L (ref 135–145)

## 2021-01-16 LAB — RESP PANEL BY RT-PCR (FLU A&B, COVID) ARPGX2
Influenza A by PCR: NEGATIVE
Influenza B by PCR: NEGATIVE
SARS Coronavirus 2 by RT PCR: NEGATIVE

## 2021-01-16 LAB — GLUCOSE, CAPILLARY
Glucose-Capillary: 268 mg/dL — ABNORMAL HIGH (ref 70–99)
Glucose-Capillary: 223 mg/dL — ABNORMAL HIGH (ref 70–99)

## 2021-01-16 LAB — LIPID PANEL
Cholesterol: 144 mg/dL (ref 0–200)
HDL: 69 mg/dL (ref >40)
LDL Cholesterol: 63 mg/dL (ref 0–99)
Total CHOL/HDL Ratio: 2.1 RATIO
Triglycerides: 61 mg/dL (ref <150)
VLDL: 12 mg/dL (ref 0–40)

## 2021-01-16 LAB — TROPONIN I (HIGH SENSITIVITY)
Troponin I (High Sensitivity): 52 ng/L — ABNORMAL HIGH (ref <18)
Troponin I (High Sensitivity): 85 ng/L — ABNORMAL HIGH (ref <18)
Troponin I (High Sensitivity): 102 ng/L (ref <18)
Troponin I (High Sensitivity): 89 ng/L — ABNORMAL HIGH (ref <18)

## 2021-01-16 LAB — BRAIN NATRIURETIC PEPTIDE: B Natriuretic Peptide: 631.7 pg/mL — ABNORMAL HIGH (ref 0.0–100.0)

## 2021-01-16 LAB — T4, FREE: Free T4: 1.23 ng/dL — ABNORMAL HIGH (ref 0.61–1.12)

## 2021-01-16 LAB — TSH: TSH: 0.363 u[IU]/mL (ref 0.350–4.500)

## 2021-01-16 LAB — MAGNESIUM: Magnesium: 2 mg/dL (ref 1.7–2.4)

## 2021-01-16 LAB — CBG MONITORING, ED: Glucose-Capillary: 442 mg/dL — ABNORMAL HIGH (ref 70–99)

## 2021-01-16 MED ORDER — APIXABAN 5 MG PO TABS
5.0000 mg | ORAL_TABLET | Freq: Two times a day (BID) | ORAL | Status: DC
  Administered 2021-01-16 – 2021-01-18 (×5): 5 mg via ORAL
  Filled 2021-01-16 (×5): qty 1

## 2021-01-16 MED ORDER — INSULIN ASPART 100 UNIT/ML IJ SOLN
0.0000 [IU] | Freq: Three times a day (TID) | INTRAMUSCULAR | Status: DC
  Administered 2021-01-16: 8 [IU] via SUBCUTANEOUS
  Administered 2021-01-16: 15 [IU] via SUBCUTANEOUS
  Administered 2021-01-17: 5 [IU] via SUBCUTANEOUS
  Administered 2021-01-17 (×2): 8 [IU] via SUBCUTANEOUS
  Administered 2021-01-18: 15 [IU] via SUBCUTANEOUS
  Administered 2021-01-18: 5 [IU] via SUBCUTANEOUS

## 2021-01-16 MED ORDER — INSULIN GLARGINE 100 UNIT/ML ~~LOC~~ SOLN
32.0000 [IU] | Freq: Every day | SUBCUTANEOUS | Status: DC
  Administered 2021-01-16: 32 [IU] via SUBCUTANEOUS
  Filled 2021-01-16 (×2): qty 0.32

## 2021-01-16 MED ORDER — MENTHOL 3 MG MT LOZG: 1.0000 | LOZENGE | OROMUCOSAL | Status: DC | PRN

## 2021-01-16 MED ORDER — FUROSEMIDE 10 MG/ML IJ SOLN
40.0000 mg | Freq: Two times a day (BID) | INTRAMUSCULAR | Status: DC
  Administered 2021-01-16 – 2021-01-18 (×4): 40 mg via INTRAVENOUS
  Filled 2021-01-16 (×4): qty 4

## 2021-01-16 MED ORDER — HYDRALAZINE HCL 20 MG/ML IJ SOLN: 10.0000 mg | INTRAMUSCULAR | Status: DC | PRN

## 2021-01-16 MED ORDER — AMLODIPINE BESYLATE 5 MG PO TABS: 5.0000 mg | ORAL_TABLET | Freq: Once | ORAL | Status: DC

## 2021-01-16 MED ORDER — ASPIRIN EC 81 MG PO TBEC
81.0000 mg | DELAYED_RELEASE_TABLET | Freq: Every day | ORAL | Status: DC
  Administered 2021-01-16 – 2021-01-18 (×3): 81 mg via ORAL
  Filled 2021-01-16 (×3): qty 1

## 2021-01-16 MED ORDER — SODIUM CHLORIDE 0.9 % IV SOLN: 250.0000 mL | INTRAVENOUS | Status: DC | PRN

## 2021-01-16 MED ORDER — ACETAMINOPHEN 325 MG PO TABS
650.0000 mg | ORAL_TABLET | ORAL | Status: DC | PRN
  Administered 2021-01-18: 650 mg via ORAL
  Filled 2021-01-16: qty 2

## 2021-01-16 MED ORDER — ALBUTEROL SULFATE (2.5 MG/3ML) 0.083% IN NEBU: 2.5000 mg | INHALATION_SOLUTION | Freq: Four times a day (QID) | RESPIRATORY_TRACT | Status: DC | PRN

## 2021-01-16 MED ORDER — CARVEDILOL 6.25 MG PO TABS
6.2500 mg | ORAL_TABLET | Freq: Two times a day (BID) | ORAL | Status: DC
  Administered 2021-01-16 – 2021-01-18 (×4): 6.25 mg via ORAL
  Filled 2021-01-16 (×4): qty 1

## 2021-01-16 MED ORDER — ONDANSETRON HCL 4 MG/2ML IJ SOLN
4.0000 mg | Freq: Four times a day (QID) | INTRAMUSCULAR | Status: DC | PRN
  Administered 2021-01-17: 4 mg via INTRAVENOUS
  Filled 2021-01-16: qty 2

## 2021-01-16 MED ORDER — ATORVASTATIN CALCIUM 80 MG PO TABS
80.0000 mg | ORAL_TABLET | Freq: Every evening | ORAL | Status: DC
  Administered 2021-01-16 – 2021-01-17 (×2): 80 mg via ORAL
  Filled 2021-01-16 (×2): qty 1

## 2021-01-16 MED ORDER — SODIUM CHLORIDE 0.9% FLUSH: 3.0000 mL | INTRAVENOUS | Status: DC | PRN

## 2021-01-16 MED ORDER — FUROSEMIDE 10 MG/ML IJ SOLN
40.0000 mg | Freq: Once | INTRAMUSCULAR | Status: AC
  Administered 2021-01-16: 40 mg via INTRAVENOUS
  Filled 2021-01-16: qty 4

## 2021-01-16 MED ORDER — SODIUM CHLORIDE 0.9% FLUSH
3.0000 mL | Freq: Two times a day (BID) | INTRAVENOUS | Status: DC
  Administered 2021-01-16 – 2021-01-18 (×5): 3 mL via INTRAVENOUS

## 2021-01-16 MED ORDER — ALBUTEROL SULFATE (2.5 MG/3ML) 0.083% IN NEBU
2.5000 mg | INHALATION_SOLUTION | RESPIRATORY_TRACT | Status: AC
  Administered 2021-01-16: 2.5 mg via RESPIRATORY_TRACT
  Filled 2021-01-16: qty 3

## 2021-01-16 MED ORDER — NITROGLYCERIN 2 % TD OINT
1.0000 [in_us] | TOPICAL_OINTMENT | Freq: Once | TRANSDERMAL | Status: AC
  Administered 2021-01-16: 1 [in_us] via TOPICAL
  Filled 2021-01-16: qty 1

## 2021-01-16 MED ORDER — MENTHOL 3 MG MT LOZG
1.0000 | LOZENGE | Freq: Once | OROMUCOSAL | Status: AC
  Administered 2021-01-16: 3 mg via ORAL
  Filled 2021-01-16: qty 9

## 2021-01-16 MED ORDER — HYDRALAZINE HCL 25 MG PO TABS
25.0000 mg | ORAL_TABLET | Freq: Three times a day (TID) | ORAL | Status: DC
  Administered 2021-01-16 – 2021-01-18 (×7): 25 mg via ORAL
  Filled 2021-01-16 (×7): qty 1

## 2021-01-16 NOTE — ED Notes (Signed)
Per [place] MD, give 15 units insulin SubQ

## 2021-01-16 NOTE — H&P (Addendum)
History and Physical    Cassandra Adams YQI:[id number] DOB: 1945-08-20 DOA: 01/16/2021  Referring MD/NP/PA: Gean Birchwood, MD PCP: Sharilyn Sites, MD  Patient coming from: Home  Chief Complaint: Shortness of breath  I have personally briefly reviewed patient's old medical records in [place]   HPI: Cassandra Adams is a 75 y.o. female with medical history significant of HTN, diastolic CHF, PAF on Eliquis, CAD, s/p right endarterectomy TIA, DM type II, and CKD stage III who presents with complaints of shortness of breath that woke her out of her sleep this morning around 2 AM.  Noted associated symptoms included a sharp pain underneath her left breast, cough, runny nose, and wheezing.  Denied having any diaphoresis, nausea, vomiting, diarrhea, dysuria, or fever symptoms.  She did not try anything to relieve symptoms while at home, and called her daughter who came over to bring her to the hospital.  Patient reports that she has been sleeping in a recliner due to pain in her left hip for several months.  She had felt  like her leg swelling has been getting better.  The patient's daughter is present at bedside and notes that 6 days ago they had recently decreased her Lasix from twice a day to once a day after recent lab work had shown that her kidney function was going up.  The other night patient had woken up randomly in a cold sweat and did not know why.  Patient had just recently underwent cardioversion 2 days ago with Dr. Debara Pickett.  Over the last several days her daughter noticed that her blood sugars have been running high and intermittently elevated up to 400.  She is followed by endocrinology, but did not notify them of this and reported to eating bacon when asked about her diet.    Records note her last cardiac catheterization was in 2015 which patient was noted to have  occlusion of the mid RCA, mid LAD lesion with 50% stenosis in the proximal area followed by 60 to 70% stenosis,  normal left main, and left circumflex artery for which medical management had been recommended.  ED Course: Upon admission into the emergency department patient was seen to be afebrile, pulse 83-1 09, respirations 19-34, blood pressures elevated up to 213/89, O2 saturations were noted to be as low as 90% currently maintained on 2 L of nasal cannula oxygen at 100%.  Labs significant for hemoglobin 10.4, BUN 19, creatinine 1.62, BNP 631.7, and HS-troponin 52->85.  Chest x-ray showed hazy bilateral airspace disease that could be edema or atypical pneumonia.  Patient had received  furosemide 40 mg IV and 1 inch nitroglycerin paste.  She felt her chest pain resolved after nitroglycerin patch had been placed.  Review of Systems  Constitutional:  Negative for fever.  HENT:  Positive for congestion and sore throat. Negative for nosebleeds.   Eyes:  Negative for double vision and photophobia.  Respiratory:  Positive for cough, shortness of breath and wheezing. Negative for sputum production.   Cardiovascular:  Positive for chest pain and leg swelling. Negative for palpitations.  Gastrointestinal:  Negative for abdominal pain, diarrhea, nausea and vomiting.  Genitourinary:  Negative for dysuria and hematuria.  Musculoskeletal:  Positive for joint pain.  Skin:  Negative for rash.  Neurological:  Negative for focal weakness and loss of consciousness.  Psychiatric/Behavioral:  Negative for memory loss and substance abuse.    Past Medical History:  Diagnosis Date   Arthritis    "all over"  CAD (coronary artery disease)    a. NSTEMI 12/2013 - occluded dominant RCA with L-R collaterals, moderate prox segmental LAD disease, for medical therapy initially, EF 60% with subtle inferobasal hypokinesia. Consider PCI for refractory CP.   CKD (chronic kidney disease), stage III ([place])    Hypertension    Migraines    "weekly" (01/06/2014)   Sickle cell trait ([place])    TIA (transient ischemic attack) 1978   Type II  diabetes mellitus ([place])    Vertigo     Past Surgical History:  Procedure Laterality Date   APPENDECTOMY  March 2014   had appendix frozen   CARDIAC CATHETERIZATION  "years ago" & 01/05/2014   CARDIOVERSION N/A 03/17/2014   Procedure: CARDIOVERSION;  Surgeon: [name], MD;  Location: [facility];  Service: Cardiovascular;  Laterality: N/A;   CATARACT EXTRACTION W/ INTRAOCULAR LENS  IMPLANT, BILATERAL Bilateral 04/2013-05/2013   ENDARTERECTOMY Right 02/05/2017   Procedure: ENDARTERECTOMY CAROTID-RIGHT;  Surgeon: [name], MD;  Location: [facility];  Service: Vascular;  Laterality: Right;   ESOPHAGOGASTRODUODENOSCOPY  11/08/2007    Normal esophagus without evidence of Barrett, mass, erosion/ Normal stomach, duodenal bulb   ESOPHAGOGASTRODUODENOSCOPY (EGD) WITH PROPOFOL N/A 11/08/2020   Procedure: ESOPHAGOGASTRODUODENOSCOPY (EGD) WITH PROPOFOL;  Surgeon: Thornton Park, MD;  Location: [facility];  Service: Gastroenterology;  Laterality: N/A;   GASTRIC MOTILITY STUDY  11/13/2007   mildly delayed emptying subjectively, but normal  analysis-77% of tracer emptied at 2 hours   JOINT REPLACEMENT     LAPAROSCOPIC APPENDECTOMY N/A 09/15/2012   Procedure: APPENDECTOMY LAPAROSCOPIC;  Surgeon: [name], MD;  Location: [facility];  Service: General;  Laterality: N/A;   LEFT HEART CATHETERIZATION WITH CORONARY ANGIOGRAM N/A 01/05/2014   Procedure: LEFT HEART CATHETERIZATION WITH CORONARY ANGIOGRAM;  Surgeon: [name], MD;  Location: [facility];  Service: Cardiovascular;  Laterality: N/A;   REPLACEMENT TOTAL KNEE Right 05-03-06   TEE WITHOUT CARDIOVERSION N/A 03/17/2014   Procedure: TRANSESOPHAGEAL ECHOCARDIOGRAM (TEE);  Surgeon: [name], MD;  Location[facility];  Service: Cardiovascular;  Laterality: N/A;   TONSILLECTOMY  1972     reports that she has never smoked. She has never used smokeless tobacco. She reports that she does not drink alcohol and does not use  drugs.  Allergies  Allergen Reactions   Penicillins Hives    Has patient had a PCN reaction causing immediate rash, facial/tongue/throat swelling, SOB or lightheadedness with hypotension: no Has patient had a PCN reaction causing severe rash involving mucus membranes or skin necrosis: No  Has patient had a PCN reaction that required hospitalization: no Has patient had a PCN reaction occurring within the last 10 years: no If all of the above answers are "NO", then may proceed with Cephalosporin use.     Family History  Problem Relation Age of Onset   Hyperlipidemia Mother    Hypertension Mother    Heart attack Mother    Cancer Father        lung   Hypertension Sister    Diabetes Brother    Heart disease Brother    Hyperlipidemia Brother    Hypertension Brother    Heart attack Brother    Other Brother        DVT    Prior to Admission medications   Medication Sig Start Date End Date Taking? Authorizing Provider  aspirin EC 81 MG tablet Take 1 tablet (81 mg total) by mouth daily. 11/08/20   Murlean Iba, MD  atorvastatin (LIPITOR) 80 MG tablet Take 1 tablet (80 mg total) by mouth every evening. 12/21/20   Hilty, Nadean Corwin, MD  B-D ULTRAFINE III SHORT PEN 31G X 8 MM MISC SMARTSIG:Injection Daily 07/28/20   [provider]  BD INSULIN SYRINGE U/F 31G X 5/16" 0.5 ML MISC Use to take with Novolog [facility] 01/07/21   Cassandria Anger, MD  carvedilol (COREG) 12.5 MG tablet Take 0.5 tablets (6.25 mg total) by mouth 2 (two) times daily with a meal. 01/14/21 04/14/21  Hilty, Nadean Corwin, MD  colesevelam (WELCHOL) 625 MG tablet Take 1,875 mg by mouth 2 (two) times daily with a meal. Noon and night    [provider]  ELIQUIS 5 MG TABS tablet Take 1 tablet (5 mg total) by mouth 2 (two) times daily. 11/07/20   Murlean Iba, MD  furosemide (LASIX) 40 MG tablet Take 40 mg  one tablet by mouth daily , may take an additional 40 mg if needed daily if leg edema worsens  01/10/21   Almyra Deforest, PA  glipiZIDE (GLUCOTROL XL) 2.5 MG 24 hr tablet Take 2.5 mg by mouth daily with breakfast.    [provider]  hydrALAZINE (APRESOLINE) 25 MG tablet Take 25 mg by mouth 3 (three) times daily. 09/12/20   [provider]  Insulin Glargine (BASAGLAR KWIKPEN) 100 UNIT/ML Inject 32 Units into the skin at bedtime. 10/14/20   Brita Romp, NP  lisinopril (ZESTRIL) 20 MG tablet Take 1 tablet (20 mg total) by mouth daily. 12/21/20   Hilty, Nadean Corwin, MD  nitroGLYCERIN (NITROSTAT) 0.4 MG SL tablet Place 1 tablet (0.4 mg total) under the tongue every 5 (five) minutes as needed for chest pain (up to 3 doses). 11/26/20   Hilty, Nadean Corwin, MD  NOVOLOG 100 UNIT/ML injection Inject 6-9 Units into the skin 3 (three) times daily with meals. Sliding Scale 07/28/20   [provider]  [facility] ULTRA test strip  10/08/20   [provider]  pantoprazole (PROTONIX) 40 MG tablet Take 1 tablet (40 mg total) by mouth 2 (two) times daily. 11/11/20 02/03/21  Dessa Phi, DO  polyethylene glycol (MIRALAX / GLYCOLAX) 17 g packet Take 17 g by mouth daily. 11/12/20   Dessa Phi, DO  senna-docusate (SENOKOT-S) 8.6-50 MG tablet Take 1 tablet by mouth at bedtime as needed for mild constipation. 11/11/20   Dessa Phi, DO  Vitamin D, Cholecalciferol, 25 MCG (1000 UT) TABS Take 2,000 Units by mouth every other day.    [provider]    Physical Exam:  Constitutional: Elderly female who appears to be in no acute distress at this time and able to follow command Vitals:   01/16/21 0450 01/16/21 0500 01/16/21 0530 01/16/21 0600  BP: (!) 177/78 (!) 192/83 (!) 171/79 (!) 211/108  Pulse: 83 86 (!) 109 97  Resp: (!) 23 (!) 23 (!) 34 (!) 27  Temp: 98.5 F (36.9 C)     TempSrc: Oral     SpO2: 100% 100% 100% 100%  Weight:      Height:       Eyes: PERRL, lids and conjunctivae normal ENMT: Mucous membranes are dry posterior pharynx clear of any exudate or  lesions.edentulous. Neck: normal, supple, no masses, no thyromegaly.  JVD present. Respiratory: Decreased overall aeration with positive end expiratory wheeze appreciated.  Patient currently on 2 L nasal cannula oxygen with O2 saturations 100%.  Patient able to talk in fairly complete sentences. Cardiovascular: Regular rate and rhythm, no  murmurs / rubs / gallops.  2+ pitting lower extremity edema.  2+ pedal pulses. No carotid bruits.  Abdomen: no tenderness, no masses palpated. No hepatosplenomegaly. Bowel sounds positive.  Musculoskeletal: no clubbing / cyanosis. No joint deformity upper and lower extremities. Good ROM, no contractures. Normal muscle tone.  Skin: no rashes, lesions, ulcers. No induration Neurologic: CN 2-12 grossly intact. Sensation intact, DTR normal. Strength 5/5 in all 4.  Psychiatric: Normal judgment and insight. Alert and oriented x 3. Normal mood.     Labs on Admission: I have personally reviewed following labs and imaging studies  CBC: Recent Labs  Lab 01/16/21 0353  WBC 6.8  NEUTROABS 5.1  HGB 10.4*  HCT 31.4*  MCV 78.7*  PLT 509   Basic Metabolic Panel: Recent Labs  Lab 01/16/21 0353  NA 137  K 4.4  CL 107  CO2 24  GLUCOSE 272*  BUN 19  CREATININE 1.62*  CALCIUM 9.8   GFR: Estimated Creatinine Clearance: 32.3 mL/min (A) (by C-G formula based on SCr of 1.62 mg/dL (H)). Liver Function Tests: No results for input(s): AST, ALT, ALKPHOS, BILITOT, PROT, ALBUMIN in the last 168 hours. No results for input(s): LIPASE, AMYLASE in the last 168 hours. No results for input(s): AMMONIA in the last 168 hours. Coagulation Profile: Recent Labs  Lab 01/16/21 0353  INR 1.3*   Cardiac Enzymes: No results for input(s): CKTOTAL, CKMB, CKMBINDEX, TROPONINI in the last 168 hours. BNP (last 3 results) No results for input(s): PROBNP in the last 8760 hours. HbA1C: No results for input(s): HGBA1C in the last 72 hours. CBG: No results for input(s): GLUCAP in  the last 168 hours. Lipid Profile: No results for input(s): CHOL, HDL, LDLCALC, TRIG, CHOLHDL, LDLDIRECT in the last 72 hours. Thyroid Function Tests: No results for input(s): TSH, T4TOTAL, FREET4, T3FREE, THYROIDAB in the last 72 hours. Anemia Panel: No results for input(s): VITAMINB12, FOLATE, FERRITIN, TIBC, IRON, RETICCTPCT in the last 72 hours. Urine analysis:    Component Value Date/Time   COLORURINE YELLOW 05/05/2019 0821   APPEARANCEUR CLEAR 05/05/2019 0821   LABSPEC 1.019 05/05/2019 0821   PHURINE 5.0 05/05/2019 0821   GLUCOSEU 150 (A) 05/05/2019 0821   HGBUR NEGATIVE 05/05/2019 0821   BILIRUBINUR NEGATIVE 05/05/2019 0821   KETONESUR NEGATIVE 05/05/2019 0821   PROTEINUR 30 (A) 05/05/2019 0821   UROBILINOGEN 0.2 09/15/2012 1010   NITRITE NEGATIVE 05/05/2019 0821   LEUKOCYTESUR LARGE (A) 05/05/2019 0821   Sepsis Labs: Recent Results (from the past 240 hour(s))  Resp Panel by RT-PCR (Flu A&B, Covid) Nasopharyngeal Swab     Status: None   Collection Time: 01/16/21  4:44 AM   Specimen: Nasopharyngeal Swab; Nasopharyngeal(NP) swabs in vial transport medium  Result Value Ref Range Status   SARS Coronavirus 2 by RT PCR NEGATIVE NEGATIVE Final    Comment: (NOTE) SARS-CoV-2 target nucleic acids are NOT DETECTED.  The SARS-CoV-2 RNA is generally detectable in upper respiratory specimens during the acute phase of infection. The lowest concentration of SARS-CoV-2 viral copies this assay can detect is 138 copies/mL. A negative result does not preclude SARS-Cov-2 infection and should not be used as the sole basis for treatment or other patient management decisions. A negative result may occur with  improper specimen collection/handling, submission of specimen other than nasopharyngeal swab, presence of viral mutation(s) within the areas targeted by this assay, and inadequate number of viral copies(<138 copies/mL). A negative result must be combined with clinical observations,  patient history, and epidemiological information. The  expected result is Negative.  Fact Sheet for Patients:  EntrepreneurPulse.com.au  Fact Sheet for Healthcare Providers:  IncredibleEmployment.be  This test is no t yet approved or cleared by the [place] FDA and  has been authorized for detection and/or diagnosis of SARS-CoV-2 by FDA under an Emergency Use Authorization (EUA). This EUA will remain  in effect (meaning this test can be used) for the duration of the COVID-19 declaration under Section 564(b)(1) of the Act, 21 U.S.C.section 360bbb-3(b)(1), unless the authorization is terminated  or revoked sooner.       Influenza A by PCR NEGATIVE NEGATIVE Final   Influenza B by PCR NEGATIVE NEGATIVE Final    Comment: (NOTE) The Xpert Xpress SARS-CoV-2/FLU/RSV plus assay is intended as an aid in the diagnosis of influenza from Nasopharyngeal swab specimens and should not be used as a sole basis for treatment. Nasal washings and aspirates are unacceptable for Xpert Xpress SARS-CoV-2/FLU/RSV testing.  Fact Sheet for Patients: EntrepreneurPulse.com.au  Fact Sheet for Healthcare Providers: IncredibleEmployment.be  This test is not yet approved or cleared by the [place] FDA and has been authorized for detection and/or diagnosis of SARS-CoV-2 by FDA under an Emergency Use Authorization (EUA). This EUA will remain in effect (meaning this test can be used) for the duration of the COVID-19 declaration under Section 564(b)(1) of the Act, 21 U.S.C. section 360bbb-3(b)(1), unless the authorization is terminated or revoked.  Performed at [facility], [place] [address] [place], [place] [address]      Radiological Exams on Admission: DG Chest Port 1 View  Result Date: 01/16/2021 CLINICAL DATA:  Shortness of breath with wheezing EXAM: PORTABLE CHEST 1 VIEW COMPARISON:  11/07/2020 FINDINGS: Hazy  opacification of the bilateral chest. No Kerley lines or effusion. Lung volumes are low. Stable generous heart size accentuated by technique. IMPRESSION: Hazy bilateral airspace disease that could be edema or atypical pneumonia. Electronically Signed   By: Monte Fantasia M.D.   On: 01/16/2021 04:16    EKG: Independently reviewed.  Sinus rhythm at 92 bpm with PVCs  Assessment/Plan  Acute respiratory distress/bronchitis secondary to diastolic congestive heart failure exacerbation with pulmonary edema: Patient presents with an onset of shortness of breath.  O2 saturations noted to drop as well as 90% for which patient was placed on 2 L of nasal cannula oxygen.  On physical exam patient with 2+ pitting edema and JVD present.  BNP elevated at 631.  Chest x-ray showing hazy bilateral airspace disease concerning more so for edema.  Just recently had echocardiogram 11/06/2020 which revealed EF of 55 to 60% with left ventricle regional wall normalities and indeterminate diastolic function.  Patient had been given 40 mg of Lasix IV. -Admit to a progressive bed -Heart failure orders set  initiated  -Continuous pulse oximetry with nasal cannula oxygen as needed to keep O2 saturations >92% -Strict I&Os and daily weights -Elevate lower extremities -Add on TSH -Lasix 40 mg IV Bid -Albuterol neb now, and as needed for wheezing/SOB -Avoid using steroids at this time due to elevated glucose reports, but consider giving if respiratory status declines with more prominent wheezing -Continue beta-blocker -Hold ACE inhibitor due to AKI -Reassess in a.m. and adjust diuresis as needed. -Defer need of repeat echocardiogram to cardiology -Cardiology consulted, we will follow-up for further recommendations.  Hypertensive urgency: Acute.  Blood pressures noted to be elevated up to 213/89.  Home blood pressure medication regimen includes Coreg 6.25 mg twice daily, hydralazine 25 mg 3 times daily, lisinopril 20  mg  daily, -Held lisinopril due to AKI -Continue Coreg, hydralazine -Hydralazine IV as needed for elevated blood pressure  Chest pain elevated troponin coronary artery disease: Acute.  Patient reported having sharp pains underneath her left breast that did not radiate.  Labs significant for high-sensitivity troponin 52 ->82.  EKG did not show clear ischemic changes.  Pain reported to have been relieved after nitroglycerin paste applied.    Last cardiac cath in July 2015 demonstrated occlusion of the mid RCA, mid LAD lesion with 50% stenosis in the proximal area followed by 60 to 70% stenosis, normal left main, and left circumflex artery for which medical management had been recommended.  Note symptoms could be secondary to demand in the setting of heart failure exacerbation with hypertensive urgency/emergency.  Paroxysmal atrial fibrillation -Continue to trend cardiac enzyme -Continue aspirin and statin -Patient may be out of her prescription for nitroglycerin as she reported not taking anything prior to coming to the hospital. -Follow-up to see if cardiology has any further recommendations  Paroxysmal atrial fibrillation on chronic anticoagulation: Patient had just underwent cardioversion on 7/22, and appears to be still in sinus rhythm. CHA2DS2-VASc score = 9(age, sex, CHF, HTN, TIA, CAD s/p right endarterectomy, and DM type II -Continue Coreg and Eliquis  Diabetes mellitus type 2,: On admission patient presents with glucose elevated up to 272, but reported blood sugars have been recently running high 400s at times..  Last hemoglobin A1c was 8.2 in 10/2020.  Insulin regimen includes Lantus 32 units nightly, glipizide 2.5 mg breakfast, and NovoLog 6 to 9 units with meals.  Elevated blood sugars could possibly be related with diet. -Hypoglycemic protocols -Hold glipizide -Continue Lantus 32 units nightly -CBGs before every meal with moderate SSI -Adjust insulin regimen as needed -Recommend  outpatient follow-up with her endocrinology  Acute kidney injury superimposed on chronic kidney disease IIIb: Patient baseline creatinine had been around 1.3, but she presents with creatinine elevated at 1.62 with BUN 19.  Suspect possibly secondary to hypoperfusion in the setting of congestive heart failure exacerbation.  Previously followed by nephrology, but patient stopped following up after they had brought up possible need of dialysis which she does not want to go on. -Hold nephrotoxic agents -Continue to monitor kidney function daily with IV diuresis  Microcytic anemia: Chronic.  Hemoglobin 10.4 g/dL on admission which appears stable around patient baseline -Continue to monitor  Subclinical hyperthyroidism: Patient did report having woken up the other night with sweats.  Records note intermittent low TSH levels, but free T4 levels have remained within normal limits. -Check TSH and free T4  Hyperlipidemia: Last lipid panel revealed LDL-52 in 05/2020. -Check lipid panel -Continue atorvastatin DVT prophylaxis: Eliquis Code Status: Full Family Communication: Daughter updated at bedside Disposition Plan: possible discharge home in 2 to 3 days Consults called: Cardiology Admission status: Inpatient, require more than 2 midnight stay for need of IV diuresis and monitoring of kidney function  Norval Morton MD [facility]   If 7PM-7AM, please contact night-coverage   01/16/2021, 7:42 AM

## 2021-01-16 NOTE — ED Triage Notes (Signed)
Pt arrives POV for eval of shortness of breath, wheezing and respiratory distress. Pt reports she started w/ SOB this morning, and became so severe this evening she decided to come in. Pt audibly wheezing/grunting on arrival, tachypneic to the 40s, 90% on RA. Diminished LS in bases

## 2021-01-16 NOTE — Consult Note (Addendum)
Cardiology Consultation:   Patient ID: Cassandra Adams MRN: [id number]; DOB: 1946-04-19  Admit date: 01/16/2021 Date of Consult: 01/16/2021  PCP:  Cassandra Sites, MD   [facility] Providers Cardiologist:  [name], MD        Patient Profile:   Cassandra Adams is a 75 y.o. female with a past medical history of CAD (s/p NSTEMI in 12/2013 showing occluded RCA with collateral flow and 50% prox-LAD and 60-70% mid-LAD stenosis with medical management recommended), HFpEF, paroxysmal atrial fibrillation, carotid artery stenosis (s/p R CEA), HTN, HLD, Type 2 DM, Stage 3 CKD and prior TIA who is being seen today for the evaluation of dyspnea at the request of Dr Tamala Julian.  History of Present Illness:   Cassandra Adams was admitted to [facility] in 10/2020 after suffering a fall and it was unclear if she had a syncopal episode. Was found to be in atrial fibrillation that admission but Cardiology was not consulted at that time. She did follow-up with Almyra Deforest, PA-C as an outpatient and was volume overloaded on examination and Lasix was titrated to 40mg  BID. She had missed a dose of Eliquis and was not a candidate for DCCV initially so Coreg was titrated from 6.25mg  BID to 12.5mg  BID. Most recent follow-up was on 01/05/2021 and she was still in atrial fibrillation with RVR and it was recommended to proceed with DCCV. She underwent DCCV by Dr. Debara Pickett on 01/14/2021 and converted back to NSR with a 150 J biphasic shock.   She presented to [facility] ED this morning for evaluation of worsening dyspnea. In talking with the patient today, she reports initially feeling well after her DCCV but around 0200 this AM she was awoke from sleep due to chest pain and acute dyspnea. Says she felt a pins and needle sensation along her chest and had difficulty catching her breath. No prior episodes of orthopnea or PND. She has noticed lower extremity edema but no abdominal distension or acute changes in weight on her  home scales.   Initial labs show WBC 6.8, Hgb 10.4, platelets 184, Na+ 137, K+ 4.4 and creatinine 1.62 (baseline 1.3 - 1.4). BNP 631. Initial Hs Troponin 85 with repeat values pending. COVID negative. CXR showing hazy bilateral airspace disease which could be edema or PNA. EKG shows NSR, HR 93 with no acute ST changes.   She has been in NSR by review of telemetry with frequent PVC's and occasional episodes of ventricular bigeminy. She does have a NTG patch in place and denies any chest pain at this current time. Received IV Lasix 40mg  x1 and has urinated already 2.4 L. BP was initially at 213/89, improved to 162/83 on most recent check.   Past Medical History:  Diagnosis Date   Arthritis    "all over"   CAD (coronary artery disease)    a. NSTEMI 12/2013 - occluded dominant RCA with L-R collaterals, moderate prox segmental LAD disease, for medical therapy initially, EF 60% with subtle inferobasal hypokinesia. Consider PCI for refractory CP.   CKD (chronic kidney disease), stage III ([place])    Hypertension    Migraines    "weekly" (01/06/2014)   Sickle cell trait ([place])    TIA (transient ischemic attack) 1978   Type II diabetes mellitus ([place])    Vertigo     Past Surgical History:  Procedure Laterality Date   APPENDECTOMY  March 2014   had appendix frozen   CARDIAC CATHETERIZATION  "years ago" & 01/05/2014  CARDIOVERSION N/A 03/17/2014   Procedure: CARDIOVERSION;  Surgeon: [name], MD;  Location: [place];  Service: Cardiovascular;  Laterality: N/A;   CATARACT EXTRACTION W/ INTRAOCULAR LENS  IMPLANT, BILATERAL Bilateral 04/2013-05/2013   ENDARTERECTOMY Right 02/05/2017   Procedure: ENDARTERECTOMY CAROTID-RIGHT;  Surgeon: [name], MD;  Location: [facility] OR;  Service: Vascular;  Laterality: Right;   ESOPHAGOGASTRODUODENOSCOPY  11/08/2007    Normal esophagus without evidence of Barrett, mass, erosion/ Normal stomach, duodenal bulb   ESOPHAGOGASTRODUODENOSCOPY (EGD) WITH  PROPOFOL N/A 11/08/2020   Procedure: ESOPHAGOGASTRODUODENOSCOPY (EGD) WITH PROPOFOL;  Surgeon: Thornton Park, MD;  Location: [place];  Service: Gastroenterology;  Laterality: N/A;   GASTRIC MOTILITY STUDY  11/13/2007   mildly delayed emptying subjectively, but normal  analysis-77% of tracer emptied at 2 hours   JOINT REPLACEMENT     LAPAROSCOPIC APPENDECTOMY N/A 09/15/2012   Procedure: APPENDECTOMY LAPAROSCOPIC;  Surgeon: [name], MD;  Location: [facility];  Service: General;  Laterality: N/A;   LEFT HEART CATHETERIZATION WITH CORONARY ANGIOGRAM N/A 01/05/2014   Procedure: LEFT HEART CATHETERIZATION WITH CORONARY ANGIOGRAM;  Surgeon: [name], MD;  Location: [facility] CATH LAB;  Service: Cardiovascular;  Laterality: N/A;   REPLACEMENT TOTAL KNEE Right 05-03-06   TEE WITHOUT CARDIOVERSION N/A 03/17/2014   Procedure: TRANSESOPHAGEAL ECHOCARDIOGRAM (TEE);  Surgeon: [name], MD;  Location: [facility] ENDOSCOPY;  Service: Cardiovascular;  Laterality: N/A;   TONSILLECTOMY  1972     Home Medications:  Prior to Admission medications   Medication Sig Start Date End Date Taking? Authorizing Provider  aspirin EC 81 MG tablet Take 1 tablet (81 mg total) by mouth daily. 11/08/20   Wynetta Emery, Clanford L, MD  atorvastatin (LIPITOR) 80 MG tablet Take 1 tablet (80 mg total) by mouth every evening. 12/21/20   Hilty, Nadean Corwin, MD  B-D ULTRAFINE III SHORT PEN 31G X 8 MM MISC SMARTSIG:Injection Daily 07/28/20   [provider]  BD INSULIN SYRINGE U/F 31G X 5/16" 0.5 ML MISC Use to take with Novolog [facility] 01/07/21   Cassandria Anger, MD  carvedilol (COREG) 12.5 MG tablet Take 0.5 tablets (6.25 mg total) by mouth 2 (two) times daily with a meal. 01/14/21 04/14/21  Hilty, Nadean Corwin, MD  colesevelam (WELCHOL) 625 MG tablet Take 1,875 mg by mouth 2 (two) times daily with a meal. Noon and night    [provider]  ELIQUIS 5 MG TABS tablet Take 1 tablet (5 mg total) by mouth 2 (two) times daily.  11/07/20   Murlean Iba, MD  furosemide (LASIX) 40 MG tablet Take 40 mg  one tablet by mouth daily , may take an additional 40 mg if needed daily if leg edema worsens 01/10/21   Almyra Deforest, PA  glipiZIDE (GLUCOTROL XL) 2.5 MG 24 hr tablet Take 2.5 mg by mouth daily with breakfast.    [provider]  hydrALAZINE (APRESOLINE) 25 MG tablet Take 25 mg by mouth 3 (three) times daily. 09/12/20   [provider]  Insulin Glargine (BASAGLAR KWIKPEN) 100 UNIT/ML Inject 32 Units into the skin at bedtime. 10/14/20   Brita Romp, NP  lisinopril (ZESTRIL) 20 MG tablet Take 1 tablet (20 mg total) by mouth daily. 12/21/20   Hilty, Nadean Corwin, MD  nitroGLYCERIN (NITROSTAT) 0.4 MG SL tablet Place 1 tablet (0.4 mg total) under the tongue every 5 (five) minutes as needed for chest pain (up to 3 doses). 11/26/20   Hilty, Nadean Corwin, MD  NOVOLOG 100 UNIT/ML injection  Inject 6-9 Units into the skin 3 (three) times daily with meals. Sliding Scale 07/28/20   [provider]  [facility] ULTRA test strip  10/08/20   [provider]  pantoprazole (PROTONIX) 40 MG tablet Take 1 tablet (40 mg total) by mouth 2 (two) times daily. 11/11/20 02/03/21  Dessa Phi, DO  polyethylene glycol (MIRALAX / GLYCOLAX) 17 g packet Take 17 g by mouth daily. 11/12/20   Dessa Phi, DO  senna-docusate (SENOKOT-S) 8.6-50 MG tablet Take 1 tablet by mouth at bedtime as needed for mild constipation. 11/11/20   Dessa Phi, DO  Vitamin D, Cholecalciferol, 25 MCG (1000 UT) TABS Take 2,000 Units by mouth every other day.    [provider]    Inpatient Medications: Scheduled Meds:  apixaban  5 mg Oral BID   aspirin EC  81 mg Oral Daily   atorvastatin  80 mg Oral QPM   carvedilol  6.25 mg Oral BID WC   furosemide  40 mg Intravenous BID   hydrALAZINE  25 mg Oral TID   insulin aspart  0-15 Units Subcutaneous TID WC   insulin glargine  32 Units Subcutaneous QHS   menthol-cetylpyridinium  1 lozenge  Oral Once   sodium chloride flush  3 mL Intravenous Q12H   Continuous Infusions:  sodium chloride     PRN Meds: sodium chloride, acetaminophen, albuterol, hydrALAZINE, menthol-cetylpyridinium, ondansetron (ZOFRAN) IV, sodium chloride flush  Allergies:    Allergies  Allergen Reactions   Penicillins Hives    Has patient had a PCN reaction causing immediate rash, facial/tongue/throat swelling, SOB or lightheadedness with hypotension: no Has patient had a PCN reaction causing severe rash involving mucus membranes or skin necrosis: No  Has patient had a PCN reaction that required hospitalization: no Has patient had a PCN reaction occurring within the last 10 years: no If all of the above answers are "NO", then may proceed with Cephalosporin use.     Social History:   Social History   Socioeconomic History   Marital status: Single    Spouse name: Not on file   Number of children: Not on file   Years of education: Not on file   Highest education level: Not on file  Occupational History   Not on file  Tobacco Use   Smoking status: Never   Smokeless tobacco: Never  Vaping Use   Vaping Use: Never used  Substance and Sexual Activity   Alcohol use: No   Drug use: No   Sexual activity: Not Currently    Birth control/protection: Post-menopausal  Other Topics Concern   Not on file  Social History Narrative   Not on file   Social Determinants of Health   Financial Resource Strain: Not on file  Food Insecurity: Not on file  Transportation Needs: Not on file  Physical Activity: Not on file  Stress: Not on file  Social Connections: Not on file  Intimate Partner Violence: Not on file    Family History:    Family History  Problem Relation Age of Onset   Hyperlipidemia Mother    Hypertension Mother    Heart attack Mother    Cancer Father        lung   Hypertension Sister    Diabetes Brother    Heart disease Brother    Hyperlipidemia Brother    Hypertension Brother     Heart attack Brother    Other Brother        DVT     ROS:  Please see the history of present illness.   All other ROS reviewed and negative.     Physical Exam/Data:   Vitals:   01/16/21 0805 01/16/21 0830 01/16/21 0855 01/16/21 0944  BP: (!) 166/79 (!) 162/83 (!) 162/83   Pulse: 82 71 73   Resp: (!) 27 (!) 21 18   Temp:    98.6 F (37 C)  TempSrc:    Oral  SpO2: 100% 100% 100%   Weight:      Height:        Intake/Output Summary (Last 24 hours) at 01/16/2021 0955 Last data filed at 01/16/2021 0941 Gross per 24 hour  Intake --  Output 2400 ml  Net -2400 ml   Last 3 Weights 01/16/2021 01/14/2021 01/05/2021  Weight (lbs) 185 lb 3 oz 184 lb 184 lb  Weight (kg) 84 kg 83.462 kg 83.462 kg     Body mass index is 29.89 kg/m.  General: Elderly female appearing in no acute distress HEENT: normal Lymph: no adenopathy Neck: JVD at 9 cm.  Endocrine:  No thryomegaly Vascular: No carotid bruits; FA pulses 2+ bilaterally without bruits  Cardiac:  normal S1, S2; RRR with frequent ectopic beats; 2/6 SEM along RUSB.  Lungs: rales along bases bilaterally.  Abd: soft, nontender, no hepatomegaly  Ext: 1+ pitting edema bilaterally.  Musculoskeletal:  No deformities, BUE and BLE strength normal and equal Skin: warm and dry  Neuro:  CNs 2-12 intact, no focal abnormalities noted Psych:  Normal affect   EKG:  The EKG was personally reviewed and demonstrates: NSR, HR 93 with no acute ST changes.   Telemetry:  Telemetry was personally reviewed and demonstrates: NSR, HR in 70's to 80's with frequent PVC's and occasional episodes of ventricular bigeminy.  Relevant CV Studies:  Echocardiogram: 11/06/2020 IMPRESSIONS     1. Left ventricular ejection fraction, by estimation, is 55 to 60%. The  left ventricle has normal function. The left ventricle demonstrates  regional wall motion abnormalities (see scoring diagram/findings for  description). There is moderate asymmetric  left ventricular  hypertrophy of the basal and septal segments. Left  ventricular diastolic parameters are indeterminate.   2. Right ventricular systolic function is normal. The right ventricular  size is normal. There is normal pulmonary artery systolic pressure. The  estimated right ventricular systolic pressure is 10.2 mmHg.   3. Left atrial size was mild to moderately dilated.   4. Possible small to moderate pericardial fluid collection at posterior  base of LV. No obvious hemodynamic significance.   5. The mitral valve is abnormal, mildly calcified with restricted  posterior leaflet motion. Mild mitral valve regurgitation.   6. The aortic valve is tricuspid. Aortic valve regurgitation is not  visualized. Mild to moderate aortic valve sclerosis/calcification is  present, without any evidence of aortic stenosis.   7. The inferior vena cava is normal in size with greater than 50%  respiratory variability, suggesting right atrial pressure of 3 mmHg.   Laboratory Data:  High Sensitivity Troponin:   Recent Labs  Lab 01/16/21 0353 01/16/21 0545  TROPONINIHS 52* 85*     Chemistry Recent Labs  Lab 01/16/21 0353  NA 137  K 4.4  CL 107  CO2 24  GLUCOSE 272*  BUN 19  CREATININE 1.62*  CALCIUM 9.8  GFRNONAA 33*  ANIONGAP 6    No results for input(s): PROT, ALBUMIN, AST, ALT, ALKPHOS, BILITOT in the last 168 hours. Hematology Recent Labs  Lab 01/16/21 0353  WBC 6.8  RBC  3.99  HGB 10.4*  HCT 31.4*  MCV 78.7*  MCH 26.1  MCHC 33.1  RDW 14.4  PLT 184   BNP Recent Labs  Lab 01/16/21 0353  BNP 631.7*    DDimer No results for input(s): DDIMER in the last 168 hours.   Radiology/Studies:  DG Chest Port 1 View  Result Date: 01/16/2021 CLINICAL DATA:  Shortness of breath with wheezing EXAM: PORTABLE CHEST 1 VIEW COMPARISON:  11/07/2020 FINDINGS: Hazy opacification of the bilateral chest. No Kerley lines or effusion. Lung volumes are low. Stable generous heart size accentuated by  technique. IMPRESSION: Hazy bilateral airspace disease that could be edema or atypical pneumonia. Electronically Signed   By: Monte Fantasia M.D.   On: 01/16/2021 04:16     Assessment and Plan:   1. Acute HFpEF Exacerbation - Developed what sounds most consistent with flash pulmonary edema overnight and reports associated chest discomfort at that time.  - BNP 631. Initial Hs Troponin 85 with repeat values pending. CXR showing hazy bilateral airspace disease. EKG shows NSR, HR 93 with no acute ST changes.  - She has been started on IV Lasix 40mg  BID with a great response of -2.4 L net negative thus far. Continue to follow I&O's along with daily weights. Recent echo in 10/2020 showed a preserved EF of 55-60% with the basal inferior segment being akinetic. She did have a small to moderate pericardial effusion at that time. Would plan to get a limited echo for reassessment of her EF and effusion.   2. CAD/Elevated Troponin Values - She has known CAD with prior cath in 12/2013 showing occluded RCA with collateral flow and 50% prox-LAD and 60-70% mid-LAD stenosis with medical management recommended at that time.  - Initial Hs Troponin 85 with repeat values pending. At this time, suspect her elevation is secondary to demand ischemia in the setting of known CAD. Continue to follow the trend for now. If she were to require invasive work-up would need to bridge with Heparin as she is currently on Eliquis and recently underwent DCCV two days ago.  - Remains on ASA, Atorvastatin 80mg  daily and Coreg 6.25mg  BID.   3. Persistent Atrial Fibrillation - She recently underwent DCCV by Dr. Debara Pickett on 01/14/2021 and converted back to NSR with a 150 J biphasic shock. Maintaining NSR at this time but she continues to have frequent ectopy. Continue Coreg 6.25mg  BID for rate-control and Eliquis 5mg  BID for anticoagulation.   4. Hypertensive Urgency - BP was initially elevated to 213/89, improved to 162/83 on most recent  check. She has been restarted on PTA Coreg and Hydralazine. Lisinopril currently held due to AKI.   5. Stage 3 CKD - Creatinine at 1.62 on admission and her baseline appears to be between 1.3 - 1.4. Continue to follow with diuresis.    6. Ventricular Bigeminy - K+ at 4.4 on admission. Will check Mg. Continue PTA Coreg 6.25mg  BID.    Risk Assessment/Risk Scores:   CHA2DS2-VASc Score = 8  This indicates a 10.8% annual risk of stroke. The patient's score is based upon: CHF History: No HTN History: Yes Diabetes History: Yes Stroke History: Yes Vascular Disease History: Yes Age Score: 2 Gender Score: 1      For questions or updates, please contact [place] Please consult [email or web address] for contact info under    Signed, Erma Heritage, PA-C  01/16/2021 9:55 AM   Attending note:  Patient seen and examined.  I reviewed her records and discussed the case  with Ms. Delano Metz, agree with her above findings.  Ms. Humm presents to the ER reporting worsening shortness of breath, woke up last night with symptoms also an atypical pen like sensation in her chest.  She does not describe any definite weight change, although her daughter mentions that diuretic dosing did have to be cut back at what point related to progressive renal insufficiency and she has had trouble with "holding fluid" since then.  She is noted to be in sinus rhythm today with occasional ventricular ectopy having undergone recent cardioversion for atrial fibrillation on July 22.  On examination she is in no distress.  She is afebrile, heart rate is in the 70s to 80s in sinus rhythm by telemetry which I personally reviewed.  She presented significantly hypertensive with blood pressure coming down to 157/74.  Lungs exhibit scattered rhonchi with crackles at the bases.  Cardiac exam reveals RRR with 1/6 stock murmur no gallop.  Pertinent lab work includes potassium 4.4, BUN 19, creatinine 1.62, high-sensitivity  troponin I of 52 and 85, hemoglobin 10.4, platelets 184.  Chest x-ray reports interstitial edema.  I personally reviewed her ECG which shows sinus rhythm with PVCs.  Patient presents with acute on chronic diastolic heart failure.  She recently underwent successful cardioversion of atrial fibrillation and remains in sinus rhythm today.  Cardiac enzymes do not suggest ACS, more in line with heart failure, although should continue to be trended.  Agree with admission for IV Lasix and further stabilization, otherwise continue baseline cardiac regimen and obtain a follow-up limited echocardiogram.  Her last study in May revealed LVEF 55 to 60%, also small to moderate pericardial effusion.  Satira Sark, M.D., F.A.C.C.

## 2021-01-16 NOTE — ED Notes (Signed)
Raquel Sarna RN notified of Troponin elevation of 85

## 2021-01-16 NOTE — ED Notes (Signed)
Notified Cassandra Adams, R MD about pt CBG 442 per insulin order parameters. Awaiting response. Pt VSS and in NAD

## 2021-01-16 NOTE — ED Provider Notes (Signed)
[facility] EMERGENCY DEPARTMENT Provider Note   CSN: [id number] Arrival date & time: 01/16/21  3716     History Chief Complaint  Patient presents with  . Shortness of Breath    Cassandra Adams is a 75 y.o. female.  The history is provided by the patient.  Shortness of Breath Severity:  Severe Onset quality:  Sudden Duration:  3 hours Timing:  Constant Progression:  Unchanged Chronicity:  New Context: not URI   Relieved by:  Nothing Worsened by:  Nothing Ineffective treatments:  Inhaler Associated symptoms: chest pain, cough and wheezing   Associated symptoms: no fever, no rash, no sore throat, no sputum production and no vomiting   Risk factors: no prolonged immobilization   Patient with a h/o CAD s/p cardioversion has had worsening peripheral edema x 4 days and then awoke with SOB and CP 2.5 hours ago.  Pain is under the left breast.  No radition.  Also reports wheezing.  No f/c/r.      Past Medical History:  Diagnosis Date  . Arthritis    "all over"  . CAD (coronary artery disease)    a. NSTEMI 12/2013 - occluded dominant RCA with L-R collaterals, moderate prox segmental LAD disease, for medical therapy initially, EF 60% with subtle inferobasal hypokinesia. Consider PCI for refractory CP.  [name] CKD (chronic kidney disease), stage III ([place])   . Hypertension   . Migraines    "weekly" (01/06/2014)  . Sickle cell trait ([place])   . TIA (transient ischemic attack) 1978  . Type II diabetes mellitus ([place])   . Vertigo     Patient Active Problem List   Diagnosis Date Noted  . Acute CHF (congestive heart failure) ([place]) 01/16/2021  . Persistent atrial fibrillation ([place])   . Gastric nodule   . Acute blood loss anemia 11/07/2020  . Hematemesis with nausea 11/07/2020  . Atrial fibrillation, chronic ([place]) 11/07/2020  . Transient hypotension 11/07/2020  . Atypical chest pain 11/07/2020  . Elevated troponin 11/07/2020  . Syncope and collapse 11/05/2020   . CAD (coronary artery disease)   . Epistaxis due to trauma   . Benign paroxysmal positional vertigo 05/11/2019  . Chronic headaches 05/11/2019  . Dizziness and giddiness 05/11/2019  . Atrial fibrillation with RVR ([place])   . Right hemiparesis ([place]) 05/05/2019  . Uncontrolled type 2 diabetes mellitus with hyperglycemia ([place]) 05/05/2019  . CKD stage 3 due to type 2 diabetes mellitus ([place]) 05/05/2019  . Left-sided weakness 05/04/2019  . Aphasia 02/26/2019  . DM type 2 causing vascular disease ([place]) 06/11/2017  . Type 2 diabetes mellitus with stage 3a chronic kidney disease, with long-term current use of insulin ([place]) 06/11/2017  . Carotid stenosis 02/05/2017  . TIA (transient ischemic attack) 01/20/2017  . Carotid stenosis, right 01/20/2017  . Diabetic hyperosmolar non-ketotic state ([place]) 01/19/2017  . Acute kidney injury superimposed on chronic kidney disease ([place]) 01/19/2017  . Left leg weakness 01/19/2017  . Dyslipidemia 11/24/2016  . Edema 03/30/2016  . Diaphoresis 02/18/2016  . Hypoglycemia 02/18/2016  . Bradycardia 04/22/2015  . Coronary artery disease due to lipid rich plaque 02/26/2014  . PAF (paroxysmal atrial fibrillation) ([place]) 02/26/2014  . NSTEMI (non-ST elevated myocardial infarction) ([place]) 01/03/2014  . Occlusion and stenosis of carotid artery without mention of cerebral infarction 11/21/2013  . Pain in limb-Left neck 11/21/2013  . Carotid stenosis, bilateral 11/17/2011  . HYPERTHYROIDISM, SUBCLINICAL 02/15/2010  . GERD 02/15/2010  . DYSPHAGIA UNSPECIFIED 02/15/2010  . ABDOMINAL PAIN, UNSPECIFIED SITE  02/15/2010  . Controlled insulin-dependent diabetes mellitus with neuropathy 02/11/2010  . Essential hypertension, benign 02/11/2010  . HEADACHE 02/11/2010    Past Surgical History:  Procedure Laterality Date  . APPENDECTOMY  March 2014   had appendix frozen  . CARDIAC CATHETERIZATION  "years ago" & 01/05/2014  . CARDIOVERSION N/A 03/17/2014   Procedure:  CARDIOVERSION;  Surgeon: [name], MD;  Location: [facility] ENDOSCOPY;  Service: Cardiovascular;  Laterality: N/A;  . CATARACT EXTRACTION W/ INTRAOCULAR LENS  IMPLANT, BILATERAL Bilateral 04/2013-05/2013  . ENDARTERECTOMY Right 02/05/2017   Procedure: ENDARTERECTOMY CAROTID-RIGHT;  Surgeon: [name], MD;  Location: [facility] OR;  Service: Vascular;  Laterality: Right;  . ESOPHAGOGASTRODUODENOSCOPY  11/08/2007    Normal esophagus without evidence of Barrett, mass, erosion/ Normal stomach, duodenal bulb  . ESOPHAGOGASTRODUODENOSCOPY (EGD) WITH PROPOFOL N/A 11/08/2020   Procedure: ESOPHAGOGASTRODUODENOSCOPY (EGD) WITH PROPOFOL;  Surgeon: Thornton Park, MD;  Location: [place];  Service: Gastroenterology;  Laterality: N/A;  . GASTRIC MOTILITY STUDY  11/13/2007   mildly delayed emptying subjectively, but normal  analysis-77% of tracer emptied at 2 hours  . JOINT REPLACEMENT    . LAPAROSCOPIC APPENDECTOMY N/A 09/15/2012   Procedure: APPENDECTOMY LAPAROSCOPIC;  Surgeon: [name], MD;  Location: [facility] ORS;  Service: General;  Laterality: N/A;  . LEFT HEART CATHETERIZATION WITH CORONARY ANGIOGRAM N/A 01/05/2014   Procedure: LEFT HEART CATHETERIZATION WITH CORONARY ANGIOGRAM;  Surgeon: [name], MD;  Location: [facility] CATH LAB;  Service: Cardiovascular;  Laterality: N/A;  . REPLACEMENT TOTAL KNEE Right 05-03-06  . TEE WITHOUT CARDIOVERSION N/A 03/17/2014   Procedure: TRANSESOPHAGEAL ECHOCARDIOGRAM (TEE);  Surgeon: [name], MD;  Location: [facility] ENDOSCOPY;  Service: Cardiovascular;  Laterality: N/A;  . TONSILLECTOMY  1972     OB History     Gravida  2   Para  2   Term  2   Preterm      AB      Living  2      SAB      IAB      Ectopic      Multiple      Live Births              Family History  Problem Relation Age of Onset  . Hyperlipidemia Mother   . Hypertension Mother   . Heart attack Mother   . Cancer Father        lung  . Hypertension Sister   .  Diabetes Brother   . Heart disease Brother   . Hyperlipidemia Brother   . Hypertension Brother   . Heart attack Brother   . Other Brother        DVT    Social History   Tobacco Use  . Smoking status: Never  . Smokeless tobacco: Never  Vaping Use  . Vaping Use: Never used  Substance Use Topics  . Alcohol use: No  . Drug use: No    Home Medications Prior to Admission medications   Medication Sig Start Date End Date Taking? Authorizing Provider  aspirin EC 81 MG tablet Take 1 tablet (81 mg total) by mouth daily. 11/08/20   Johnson, Clanford L, MD  atorvastatin (LIPITOR) 80 MG tablet Take 1 tablet (80 mg total) by mouth every evening. 12/21/20   Hilty, Nadean Corwin, MD  B-D ULTRAFINE III SHORT PEN 31G X 8 MM MISC SMARTSIG:Injection Daily 07/28/20   [provider]  BD INSULIN SYRINGE U/F 31G X 5/16" 0.5 ML MISC Use  to take with Novolog [facility] 01/07/21   Cassandria Anger, MD  carvedilol (COREG) 12.5 MG tablet Take 0.5 tablets (6.25 mg total) by mouth 2 (two) times daily with a meal. 01/14/21 04/14/21  Hilty, Nadean Corwin, MD  colesevelam (WELCHOL) 625 MG tablet Take 1,875 mg by mouth 2 (two) times daily with a meal. Noon and night    [provider]  ELIQUIS 5 MG TABS tablet Take 1 tablet (5 mg total) by mouth 2 (two) times daily. 11/07/20   Murlean Iba, MD  furosemide (LASIX) 40 MG tablet Take 40 mg  one tablet by mouth daily , may take an additional 40 mg if needed daily if leg edema worsens 01/10/21   Almyra Deforest, PA  glipiZIDE (GLUCOTROL XL) 2.5 MG 24 hr tablet Take 2.5 mg by mouth daily with breakfast.    [provider]  hydrALAZINE (APRESOLINE) 25 MG tablet Take 25 mg by mouth 3 (three) times daily. 09/12/20   [provider]  Insulin Glargine (BASAGLAR KWIKPEN) 100 UNIT/ML Inject 32 Units into the skin at bedtime. 10/14/20   Brita Romp, NP  lisinopril (ZESTRIL) 20 MG tablet Take 1 tablet (20 mg total) by mouth daily. 12/21/20   Hilty, Nadean Corwin, MD  nitroGLYCERIN (NITROSTAT) 0.4 MG SL tablet Place 1 tablet (0.4 mg total) under the tongue every 5 (five) minutes as needed for chest pain (up to 3 doses). 11/26/20   Hilty, Nadean Corwin, MD  NOVOLOG 100 UNIT/ML injection Inject 6-9 Units into the skin 3 (three) times daily with meals. Sliding Scale 07/28/20   [provider]  [facility] ULTRA test strip  10/08/20   [provider]  pantoprazole (PROTONIX) 40 MG tablet Take 1 tablet (40 mg total) by mouth 2 (two) times daily. 11/11/20 02/03/21  Dessa Phi, DO  polyethylene glycol (MIRALAX / GLYCOLAX) 17 g packet Take 17 g by mouth daily. 11/12/20   Dessa Phi, DO  senna-docusate (SENOKOT-S) 8.6-50 MG tablet Take 1 tablet by mouth at bedtime as needed for mild constipation. 11/11/20   Dessa Phi, DO  Vitamin D, Cholecalciferol, 25 MCG (1000 UT) TABS Take 2,000 Units by mouth every other day.    [provider]    Allergies    Penicillins  Review of Systems   Review of Systems  Constitutional:  Negative for fever.  HENT:  Negative for sore throat.   Eyes:  Negative for redness.  Respiratory:  Positive for cough, shortness of breath and wheezing. Negative for sputum production.   Cardiovascular:  Positive for chest pain and leg swelling. Negative for palpitations.  Gastrointestinal:  Negative for vomiting.  Genitourinary:  Negative for difficulty urinating.  Musculoskeletal:  Negative for neck stiffness.  Skin:  Negative for rash.  Neurological:  Negative for facial asymmetry.  Psychiatric/Behavioral:  Negative for confusion.   All other systems reviewed and are negative.  Physical Exam Updated Vital Signs BP (!) 171/79   Pulse (!) 109   Temp 98.5 F (36.9 C) (Oral)   Resp (!) 34   Ht 5\' 6"  (1.676 m)   Wt 84 kg   SpO2 100%   BMI 29.89 kg/m   Physical Exam Vitals and nursing note reviewed.  Constitutional:      Appearance: Normal appearance. She is not diaphoretic.  HENT:     Head:  Normocephalic and atraumatic.     Nose: Nose normal.  Eyes:     Conjunctiva/sclera: Conjunctivae normal.     Pupils: Pupils are  equal, round, and reactive to light.  Cardiovascular:     Rate and Rhythm: Normal rate and regular rhythm.     Pulses: Normal pulses.     Heart sounds: Normal heart sounds.  Pulmonary:     Breath sounds: Wheezing and rales present.  Abdominal:     General: Abdomen is flat. Bowel sounds are normal.     Palpations: Abdomen is soft.     Tenderness: There is no abdominal tenderness. There is no guarding.  Musculoskeletal:     Cervical back: Normal range of motion and neck supple.     Right lower leg: Edema present.     Left lower leg: Edema present.  Skin:    General: Skin is warm and dry.     Capillary Refill: Capillary refill takes less than 2 seconds.  Neurological:     General: No focal deficit present.     Mental Status: She is alert and oriented to person, place, and time.     Deep Tendon Reflexes: Reflexes normal.  Psychiatric:        Mood and Affect: Mood normal.        Behavior: Behavior normal.    ED Results / Procedures / Treatments   Labs (all labs ordered are listed, but only abnormal results are displayed) Results for orders placed or performed during the hospital encounter of 01/16/21  CBC with Differential  Result Value Ref Range   WBC 6.8 4.0 - 10.5 K/uL   RBC 3.99 3.87 - 5.11 MIL/uL   Hemoglobin 10.4 (L) 12.0 - 15.0 g/dL   HCT 31.4 (L) 36.0 - 46.0 %   MCV 78.7 (L) 80.0 - 100.0 fL   MCH 26.1 26.0 - 34.0 pg   MCHC 33.1 30.0 - 36.0 g/dL   RDW 14.4 11.5 - 15.5 %   Platelets 184 150 - 400 K/uL   nRBC 0.0 0.0 - 0.2 %   Neutrophils Relative % 76 %   Neutro Abs 5.1 1.7 - 7.7 K/uL   Lymphocytes Relative 13 %   Lymphs Abs 0.9 0.7 - 4.0 K/uL   Monocytes Relative 8 %   Monocytes Absolute 0.6 0.1 - 1.0 K/uL   Eosinophils Relative 3 %   Eosinophils Absolute 0.2 0.0 - 0.5 K/uL   Basophils Relative 0 %   Basophils Absolute 0.0 0.0 - 0.1  K/uL   Immature Granulocytes 0 %   Abs Immature Granulocytes 0.02 0.00 - 0.07 K/uL  Brain natriuretic peptide  Result Value Ref Range   B Natriuretic Peptide 631.7 (H) 0.0 - 100.0 pg/mL  Basic metabolic panel  Result Value Ref Range   Sodium 137 135 - 145 mmol/L   Potassium 4.4 3.5 - 5.1 mmol/L   Chloride 107 98 - 111 mmol/L   CO2 24 22 - 32 mmol/L   Glucose, Bld 272 (H) 70 - 99 mg/dL   BUN 19 8 - 23 mg/dL   Creatinine, Ser 1.62 (H) 0.44 - 1.00 mg/dL   Calcium 9.8 8.9 - 10.3 mg/dL   GFR, Estimated 33 (L) >60 mL/min   Anion gap 6 5 - 15  Protime-INR  Result Value Ref Range   Prothrombin Time 16.4 (H) 11.4 - 15.2 seconds   INR 1.3 (H) 0.8 - 1.2  Troponin I (High Sensitivity)  Result Value Ref Range   Troponin I (High Sensitivity) 52 (H) <18 ng/L   DG Chest Port 1 View  Result Date: 01/16/2021 CLINICAL DATA:  Shortness of breath with wheezing EXAM: PORTABLE  CHEST 1 VIEW COMPARISON:  11/07/2020 FINDINGS: Hazy opacification of the bilateral chest. No Kerley lines or effusion. Lung volumes are low. Stable generous heart size accentuated by technique. IMPRESSION: Hazy bilateral airspace disease that could be edema or atypical pneumonia. Electronically Signed   By: Monte Fantasia M.D.   On: 01/16/2021 04:16     EKG EKG Interpretation  Date/Time:  Sunday January 16 2021 04:37:03 EDT Ventricular Rate:  92 PR Interval:  167 QRS Duration: 92 QT Interval:  372 QTC Calculation: 461 R Axis:   33 Text Interpretation: Sinus rhythm Ventricular premature complex Confirmed by Dory Horn) on 01/16/2021 5:46:08 AM  Radiology DG Chest Port 1 View  Result Date: 01/16/2021 CLINICAL DATA:  Shortness of breath with wheezing EXAM: PORTABLE CHEST 1 VIEW COMPARISON:  11/07/2020 FINDINGS: Hazy opacification of the bilateral chest. No Kerley lines or effusion. Lung volumes are low. Stable generous heart size accentuated by technique. IMPRESSION: Hazy bilateral airspace disease that could be  edema or atypical pneumonia. Electronically Signed   By: Monte Fantasia M.D.   On: 01/16/2021 04:16    Procedures Procedures   Medications Ordered in ED Medications  amLODipine (NORVASC) tablet 5 mg (has no administration in time range)  furosemide (LASIX) injection 40 mg (40 mg Intravenous Given 01/16/21 0526)  nitroGLYCERIN (NITROGLYN) 2 % ointment 1 inch (1 inch Topical Given 01/16/21 0510)    ED Course  I have reviewed the triage vital signs and the nursing notes.  Pertinent labs & imaging results that were available during my care of the patient were reviewed by me and considered in my medical decision making (see chart for details).     Final Clinical Impression(s) / ED Diagnoses Final diagnoses:  Acute pulmonary edema (HCC)  Elevated troponin   Admit to medicine for pulmonary edema and elevated troponin Rx / DC Orders ED Discharge Orders     None        Albina Gosney, MD 01/16/21 9191

## 2021-01-16 NOTE — ED Notes (Signed)
Attempted report x 2 

## 2021-01-16 NOTE — ED Provider Notes (Signed)
Emergency Medicine Provider Triage Evaluation Note  Cassandra Adams , a 75 y.o. female  was evaluated in triage.  Pt complains of acute onset SOB at 0230. Having associated chest pain "under my titties". Symptoms have been constant. Compliant with medications including Eliquis. No fevers, weight gain, N/V, back pain, abdominal pain. Had cardioversion for Afib on 01/14/21.  Review of Systems  Positive: SOB, chest pain Negative: N/V, abdominal pain, back pain  Physical Exam  BP (!) 213/89 (BP Location: Right Arm)   Pulse 95   Resp 19   SpO2 93%  Gen:   Awake, no distress   Resp:  Dyspneic with mild tachypnea. Expiratory wheezing heard best in posterior lung fields. MSK:   Moves extremities without difficulty  Other:  Heart RRR. BLE pitting edema.  Medical Decision Making  Medically screening exam initiated at 3:47 AM.  Appropriate orders placed.  Cassandra Adams was informed that the remainder of the evaluation will be completed by another provider, this initial triage assessment does not replace that evaluation, and the importance of remaining in the ED until their evaluation is complete.  SOB   Antonietta Breach, PA-C 01/16/21 6270    Palumbo, April, MD 01/16/21 3500

## 2021-01-16 NOTE — ED Notes (Signed)
Attempted report x1. 

## 2021-01-17 ENCOUNTER — Inpatient Hospital Stay (HOSPITAL_COMMUNITY): Payer: Medicare HMO

## 2021-01-17 ENCOUNTER — Encounter (HOSPITAL_COMMUNITY): Payer: Self-pay | Admitting: Internal Medicine

## 2021-01-17 DIAGNOSIS — J810 Acute pulmonary edema: Secondary | ICD-10-CM

## 2021-01-17 DIAGNOSIS — I480 Paroxysmal atrial fibrillation: Secondary | ICD-10-CM | POA: Diagnosis not present

## 2021-01-17 DIAGNOSIS — I2510 Atherosclerotic heart disease of native coronary artery without angina pectoris: Secondary | ICD-10-CM | POA: Diagnosis not present

## 2021-01-17 DIAGNOSIS — I5033 Acute on chronic diastolic (congestive) heart failure: Secondary | ICD-10-CM

## 2021-01-17 DIAGNOSIS — I5031 Acute diastolic (congestive) heart failure: Secondary | ICD-10-CM | POA: Diagnosis not present

## 2021-01-17 LAB — BASIC METABOLIC PANEL
Anion gap: 5 (ref 5–15)
BUN: 25 mg/dL — ABNORMAL HIGH (ref 8–23)
CO2: 29 mmol/L (ref 22–32)
Calcium: 9.8 mg/dL (ref 8.9–10.3)
Chloride: 104 mmol/L (ref 98–111)
Creatinine, Ser: 1.53 mg/dL — ABNORMAL HIGH (ref 0.44–1.00)
GFR, Estimated: 35 mL/min — ABNORMAL LOW (ref >60)
Glucose, Bld: 196 mg/dL — ABNORMAL HIGH (ref 70–99)
Potassium: 3.8 mmol/L (ref 3.5–5.1)
Sodium: 138 mmol/L (ref 135–145)

## 2021-01-17 LAB — ECHOCARDIOGRAM LIMITED
Calc EF: 57.1 %
Height: 66 in
S' Lateral: 3.6 cm
Single Plane A2C EF: 59.1 %
Single Plane A4C EF: 57.3 %
Weight: 2860.69 oz

## 2021-01-17 LAB — GLUCOSE, CAPILLARY
Glucose-Capillary: 214 mg/dL — ABNORMAL HIGH (ref 70–99)
Glucose-Capillary: 273 mg/dL — ABNORMAL HIGH (ref 70–99)
Glucose-Capillary: 284 mg/dL — ABNORMAL HIGH (ref 70–99)
Glucose-Capillary: 199 mg/dL — ABNORMAL HIGH (ref 70–99)

## 2021-01-17 MED ORDER — INSULIN GLARGINE 100 UNIT/ML ~~LOC~~ SOLN
40.0000 [IU] | Freq: Every day | SUBCUTANEOUS | Status: DC
  Administered 2021-01-17: 40 [IU] via SUBCUTANEOUS
  Filled 2021-01-17 (×2): qty 0.4

## 2021-01-17 MED ORDER — HYDRALAZINE HCL 20 MG/ML IJ SOLN: 10.0000 mg | INTRAMUSCULAR | Status: DC | PRN

## 2021-01-17 NOTE — Progress Notes (Addendum)
Progress Note  Patient Name: Cassandra Adams Date of Encounter: 01/17/2021  [facility] Cardiologist: [name], MD   Subjective   No complaints, worked with PT. O2 weaned down to 1 L Cassandra Adams, does not use O2 at home.  Inpatient Medications    Scheduled Meds:  apixaban  5 mg Oral BID   aspirin EC  81 mg Oral Daily   atorvastatin  80 mg Oral QPM   carvedilol  6.25 mg Oral BID WC   furosemide  40 mg Intravenous BID   hydrALAZINE  25 mg Oral TID   insulin aspart  0-15 Units Subcutaneous TID WC   insulin glargine  32 Units Subcutaneous QHS   sodium chloride flush  3 mL Intravenous Q12H   Continuous Infusions:  sodium chloride     PRN Meds: sodium chloride, acetaminophen, albuterol, hydrALAZINE, menthol-cetylpyridinium, ondansetron (ZOFRAN) IV, sodium chloride flush   Vital Signs    Vitals:   01/16/21 2020 01/17/21 0013 01/17/21 0452 01/17/21 0734  BP: 121/61 136/65 (!) 128/56 (!) 179/75  Pulse: 61 67  72  Resp: 20 17 18 17   Temp: 98.5 F (36.9 C) 98.8 F (37.1 C) 98.4 F (36.9 C) 98.3 F (36.8 C)  TempSrc: Oral Oral Oral Oral  SpO2: 97%     Weight:   81.1 kg   Height:        Intake/Output Summary (Last 24 hours) at 01/17/2021 0813 Last data filed at 01/16/2021 2020 Gross per 24 hour  Intake 240 ml  Output 1900 ml  Net -1660 ml   Last 3 Weights 01/17/2021 01/16/2021 01/14/2021  Weight (lbs) 178 lb 12.7 oz 185 lb 3 oz 184 lb  Weight (kg) 81.1 kg 84 kg 83.462 kg      Telemetry    Sinus rhythm in the 60-80s with PACs - Personally Reviewed  ECG    No new tracings - Personally Reviewed  Physical Exam   GEN: No acute distress.   Neck: No JVD Cardiac: RRR, no murmurs, rubs, or gallops.  Respiratory: sounds distant, diminished in bases GI: Soft, nontender, non-distended  MS: 1+ B LE edema; No deformity. Neuro:  Nonfocal  Psych: Normal affect   Labs    High Sensitivity Troponin:   Recent Labs  Lab 01/16/21 0353 01/16/21 0545 01/16/21 0933  01/16/21 1200  TROPONINIHS 52* 85* 102* 89*      Chemistry Recent Labs  Lab 01/16/21 0353 01/17/21 0353  NA 137 138  K 4.4 3.8  CL 107 104  CO2 24 29  GLUCOSE 272* 196*  BUN 19 25*  CREATININE 1.62* 1.53*  CALCIUM 9.8 9.8  GFRNONAA 33* 35*  ANIONGAP 6 5     Hematology Recent Labs  Lab 01/16/21 0353  WBC 6.8  RBC 3.99  HGB 10.4*  HCT 31.4*  MCV 78.7*  MCH 26.1  MCHC 33.1  RDW 14.4  PLT 184    BNP Recent Labs  Lab 01/16/21 0353  BNP 631.7*     DDimer No results for input(s): DDIMER in the last 168 hours.   Radiology    DG Chest Port 1 View  Result Date: 01/16/2021 CLINICAL DATA:  Shortness of breath with wheezing EXAM: PORTABLE CHEST 1 VIEW COMPARISON:  11/07/2020 FINDINGS: Hazy opacification of the bilateral chest. No Kerley lines or effusion. Lung volumes are low. Stable generous heart size accentuated by technique. IMPRESSION: Hazy bilateral airspace disease that could be edema or atypical pneumonia. Electronically Signed   By: Monte Fantasia  M.D.   On: 01/16/2021 04:16    Cardiac Studies   Echo 01/17/21: pending read  Patient Profile     75 y.o. female with a past medical history of CAD (s/p NSTEMI in 12/2013 showing occluded RCA with collateral flow and 50% prox-LAD and 60-70% mid-LAD stenosis with medical management recommended), HFpEF, paroxysmal atrial fibrillation, carotid artery stenosis (s/p R CEA), HTN, HLD, Type 2 DM, Stage 3 CKD and prior TIA who is being seen today for the evaluation of dyspnea.  Assessment & Plan    Acute on chronic diastolic heart failure - BNP 632 - echo pending - presented with symptoms consistent with flash pulmonary edema - diuresing on 40 mg IV lasix BID - overall net negative 2.8 L with at least 1900 cc urine output yesterday - weight is down 7 lbs on first night of admission, but no recent weight - continues to be volume up, continue diuresis today   Hypertension - pressure continues to be labile: 136 -->  179 - home regimen includes coreg, hydralazine, and lisinopril -  lisinopril on hold here - I repeated BP in the room and was 156/59 after working with PT   PAF Chronic anticoagulation - recent DCCV 01/14/21 to sinus rhythm - continue coreg and eliquis - remains in sinus with PACs - asymptomatic     CAD - no chest pain - mildly elevated hs troponin   CKD stage III - sCr 1.53 - baseline appears to be 1.3 - ACEI on hold with IV diuresis     For questions or updates, please contact [facility] Please consult [email or web address] for contact info under        Signed, Ledora Bottcher, PA  01/17/2021, 8:13 AM    I have seen and examined the patient along with Ledora Bottcher, PA .  I have reviewed the chart, notes and new data.  I agree with PA/NP's note.  Key new complaints: Lying fully supine in bed without any dyspnea, was able to walk around the room without difficulty.  Weight in June was around 184 pounds and she now weighs 179 pounds.  She was oblivious to the arrhythmia when she was in atrial fibrillation and is not aware of PACs today. Key examination changes: Regular rate and rhythm with frequent isolated ectopic beats (PACs on telemetry), normal JVP, no edema, clear lungs Key new findings / data: Very mild elevation in serum creatinine  PLAN: I think she is now approaching euvolemia.  [facility] will sign off.   Medication Recommendations: Resume home doses of furosemide and all her other cardiac medications.  Anticoagulant should not be interrupted in the next 4 weeks. Other recommendations (labs, testing, etc): Recommend daily weight monitoring and call our office if her weight exceeds 182 pounds or if she gains more than 3 pounds in 24 hours or more than 5 pounds in a week.  I suggested purchasing a Kardia device.  That the patient does not have a smart phone, but her family visits her daily and could induce "spot checks" for arrhythmia recurrence. Follow up  as an outpatient: We will make arrangements for follow-up in the atrial fibrillation clinic in about a week and with Dr. Debara Pickett first available.   Sanda Klein, MD, [place] [phone] 01/17/2021, 11:34 AM

## 2021-01-17 NOTE — Plan of Care (Signed)
  Problem: Cardiac: Goal: Ability to achieve and maintain adequate cardiopulmonary perfusion will improve Outcome: Progressing   

## 2021-01-17 NOTE — Progress Notes (Signed)
PROGRESS NOTE    JOEL MERICLE  NWG:[id number] DOB: 1946-03-01 DOA: 01/16/2021 PCP: Sharilyn Sites, MD   Brief Narrative:  HPI: Cassandra Adams is a 75 y.o. female with medical history significant of HTN, diastolic CHF, PAF on Eliquis, CAD, s/p right endarterectomy TIA, DM type II, and CKD stage III who presents with complaints of shortness of breath that woke her out of her sleep this morning around 2 AM.  Noted associated symptoms included a sharp pain underneath her left breast, cough, runny nose, and wheezing.  Denied having any diaphoresis, nausea, vomiting, diarrhea, dysuria, or fever symptoms.  She did not try anything to relieve symptoms while at home, and called her daughter who came over to bring her to the hospital.  Patient reports that she has been sleeping in a recliner due to pain in her left hip for several months.  She had felt  like her leg swelling has been getting better.  The patient's daughter is present at bedside and notes that 6 days ago they had recently decreased her Lasix from twice a day to once a day after recent lab work had shown that her kidney function was going up.  The other night patient had woken up randomly in a cold sweat and did not know why.  Patient had just recently underwent cardioversion 2 days ago with Dr. Debara Pickett.  Over the last several days her daughter noticed that her blood sugars have been running high and intermittently elevated up to 400.  She is followed by endocrinology, but did not notify them of this and reported to eating bacon when asked about her diet.     Records note her last cardiac catheterization was in 2015 which patient was noted to have  occlusion of the mid RCA, mid LAD lesion with 50% stenosis in the proximal area followed by 60 to 70% stenosis, normal left main, and left circumflex artery for which medical management had been recommended.   ED Course: Upon admission into the emergency department patient was seen to be  afebrile, pulse 83-1 09, respirations 19-34, blood pressures elevated up to 213/89, O2 saturations were noted to be as low as 90% currently maintained on 2 L of nasal cannula oxygen at 100%.  Labs significant for hemoglobin 10.4, BUN 19, creatinine 1.62, BNP 631.7, and HS-troponin 52->85.  Chest x-ray showed hazy bilateral airspace disease that could be edema or atypical pneumonia.  Patient had received  furosemide 40 mg IV and 1 inch nitroglycerin paste.  She felt her chest pain resolved after nitroglycerin patch had been placed.  Assessment & Plan:   Principal Problem:   Acute CHF (congestive heart failure) (HCC) Active Problems:   Subclinical hyperthyroidism   AF (paroxysmal atrial fibrillation) (HCC)   Acute kidney injury superimposed on CKD (HCC)   CAD (coronary artery disease)   Acute respiratory distress   Microcytic anemia   Hypertensive urgency   Acute on chronic diastolic (congestive) heart failure (HCC)  Acute respiratory failure with hypoxia secondary to acute on chronic diastolic congestive heart failure exacerbation with pulmonary edema: Feels better than yesterday.  Down to 1 L of nasal oxygen.  Lungs clear to auscultation.  Has had decent diuresis since yesterday.  Cardiology on board.  Continue Lasix 40 mg IV twice daily with strict I's and O's.  Try to wean oxygen.  Hypertensive urgency: Blood pressure improved.  Continue Coreg and hydralazine as well as as needed IV hydralazine.  Continue to hold lisinopril due to AKI.  Chest pain elevated troponin coronary artery disease: She has known CAD with prior cath in 12/2013 showing occluded RCA with collateral flow and 50% prox-LAD and 60-70% mid-LAD stenosis with medical management recommended at that time.  Only slightly elevated troponin which are flat, consistent with demand ischemia.  Also underwent DCCV just 2 days ago.  Paroxysmal atrial fibrillation on chronic anticoagulation: Patient had just underwent cardioversion on  7/22, and appears to be still in sinus rhythm with PVCs. Continue Coreg and Eliquis   Uncontrolled diabetes mellitus type 2 with hyperglycemia: Blood sugar elevated in 200 range.  Will increase Lantus to 40 units and continue SSI.   Acute kidney injury superimposed on chronic kidney disease IIIb: Patient's baseline creatinine appears to be around 1.3, she presented with 1.6.  Improving.  Continue to monitor.  Avoid nephrotoxic agents.   Microcytic anemia: Chronic.  Stable.   Subclinical hyperthyroidism: Noted.  No symptoms.   Hyperlipidemia: Continue atorvastatin.  DVT prophylaxis:   Eliquis   Code Status: Full Code  Family Communication: Grandson present at bedside.  Plan of care discussed with patient in length and he verbalized understanding and agreed with it.  Status is: Inpatient  Remains inpatient appropriate because:Inpatient level of care appropriate due to severity of illness  Dispo: The patient is from: Home              Anticipated d/c is to: Home              Patient currently is not medically stable to d/c.   Difficult to place patient No        Estimated body mass index is 28.86 kg/m as calculated from the following:   Height as of this encounter: 5\' 6"  (1.676 m).   Weight as of this encounter: 81.1 kg.      Nutritional status:               Consultants:  Cardiology  Procedures:  None  Antimicrobials:  Anti-infectives (From admission, onward)    None          Subjective: Seen and examined.  Grandson at the bedside.  Breathing improved but she has some nausea and vomiting which is chronic and intermittent according to the grandson.  Objective: Vitals:   01/17/21 0013 01/17/21 0452 01/17/21 0734 01/17/21 1030  BP: 136/65 (!) 128/56 (!) 179/75   Pulse: 67  72   Resp: 17 18 17    Temp: 98.8 F (37.1 C) 98.4 F (36.9 C) 98.3 F (36.8 C)   TempSrc: Oral Oral Oral   SpO2:    96%  Weight:  81.1 kg    Height:         Intake/Output Summary (Last 24 hours) at 01/17/2021 1116 Last data filed at 01/17/2021 1031 Gross per 24 hour  Intake 240 ml  Output 1700 ml  Net -1460 ml   Filed Weights   01/16/21 0352 01/17/21 0452  Weight: 84 kg 81.1 kg    Examination:  General exam: Appears calm and comfortable  Respiratory system: Clear to auscultation. Respiratory effort normal. Cardiovascular system: S1 & S2 heard, RRR. No JVD, murmurs, rubs, gallops or clicks.  +1 pitting edema bilateral lower extremity. Gastrointestinal system: Abdomen is nondistended, soft and nontender. No organomegaly or masses felt. Normal bowel sounds heard. Central nervous system: Alert and oriented. No focal neurological deficits. Extremities: Symmetric 5 x 5 power. Skin: No rashes, lesions or ulcers Psychiatry: Judgement and insight appear normal. Mood & affect appropriate.  Data Reviewed: I have personally reviewed following labs and imaging studies  CBC: Recent Labs  Lab 01/16/21 0353  WBC 6.8  NEUTROABS 5.1  HGB 10.4*  HCT 31.4*  MCV 78.7*  PLT 408   Basic Metabolic Panel: Recent Labs  Lab 01/16/21 0353 01/16/21 0933 01/17/21 0353  NA 137  --  138  K 4.4  --  3.8  CL 107  --  104  CO2 24  --  29  GLUCOSE 272*  --  196*  BUN 19  --  25*  CREATININE 1.62*  --  1.53*  CALCIUM 9.8  --  9.8  MG  --  2.0  --    GFR: Estimated Creatinine Clearance: 33.6 mL/min (A) (by C-G formula based on SCr of 1.53 mg/dL (H)). Liver Function Tests: No results for input(s): AST, ALT, ALKPHOS, BILITOT, PROT, ALBUMIN in the last 168 hours. No results for input(s): LIPASE, AMYLASE in the last 168 hours. No results for input(s): AMMONIA in the last 168 hours. Coagulation Profile: Recent Labs  Lab 01/16/21 0353  INR 1.3*   Cardiac Enzymes: No results for input(s): CKTOTAL, CKMB, CKMBINDEX, TROPONINI in the last 168 hours. BNP (last 3 results) No results for input(s): PROBNP in the last 8760 hours. HbA1C: No results  for input(s): HGBA1C in the last 72 hours. CBG: Recent Labs  Lab 01/16/21 1403 01/16/21 1722 01/16/21 2142 01/17/21 0731  GLUCAP 442* 268* 223* 214*   Lipid Profile: Recent Labs    01/16/21 0933  CHOL 144  HDL 69  LDLCALC 63  TRIG 61  CHOLHDL 2.1   Thyroid Function Tests: Recent Labs    01/16/21 0932 01/16/21 0933  TSH 0.363  --   FREET4  --  1.23*   Anemia Panel: No results for input(s): VITAMINB12, FOLATE, FERRITIN, TIBC, IRON, RETICCTPCT in the last 72 hours. Sepsis Labs: No results for input(s): PROCALCITON, LATICACIDVEN in the last 168 hours.  Recent Results (from the past 240 hour(s))  Resp Panel by RT-PCR (Flu A&B, Covid) Nasopharyngeal Swab     Status: None   Collection Time: 01/16/21  4:44 AM   Specimen: Nasopharyngeal Swab; Nasopharyngeal(NP) swabs in vial transport medium  Result Value Ref Range Status   SARS Coronavirus 2 by RT PCR NEGATIVE NEGATIVE Final    Comment: (NOTE) SARS-CoV-2 target nucleic acids are NOT DETECTED.  The SARS-CoV-2 RNA is generally detectable in upper respiratory specimens during the acute phase of infection. The lowest concentration of SARS-CoV-2 viral copies this assay can detect is 138 copies/mL. A negative result does not preclude SARS-Cov-2 infection and should not be used as the sole basis for treatment or other patient management decisions. A negative result may occur with  improper specimen collection/handling, submission of specimen other than nasopharyngeal swab, presence of viral mutation(s) within the areas targeted by this assay, and inadequate number of viral copies(<138 copies/mL). A negative result must be combined with clinical observations, patient history, and epidemiological information. The expected result is Negative.  Fact Sheet for Patients:  EntrepreneurPulse.com.au  Fact Sheet for Healthcare Providers:  IncredibleEmployment.be  This test is no t yet approved or  cleared by the [place] FDA and  has been authorized for detection and/or diagnosis of SARS-CoV-2 by FDA under an Emergency Use Authorization (EUA). This EUA will remain  in effect (meaning this test can be used) for the duration of the COVID-19 declaration under Section 564(b)(1) of the Act, 21 U.S.C.section 360bbb-3(b)(1), unless the authorization is terminated  or revoked  sooner.       Influenza A by PCR NEGATIVE NEGATIVE Final   Influenza B by PCR NEGATIVE NEGATIVE Final    Comment: (NOTE) The Xpert Xpress SARS-CoV-2/FLU/RSV plus assay is intended as an aid in the diagnosis of influenza from Nasopharyngeal swab specimens and should not be used as a sole basis for treatment. Nasal washings and aspirates are unacceptable for Xpert Xpress SARS-CoV-2/FLU/RSV testing.  Fact Sheet for Patients: EntrepreneurPulse.com.au  Fact Sheet for Healthcare Providers: IncredibleEmployment.be  This test is not yet approved or cleared by the [place] FDA and has been authorized for detection and/or diagnosis of SARS-CoV-2 by FDA under an Emergency Use Authorization (EUA). This EUA will remain in effect (meaning this test can be used) for the duration of the COVID-19 declaration under Section 564(b)(1) of the Act, 21 U.S.C. section 360bbb-3(b)(1), unless the authorization is terminated or revoked.  Performed at [facility], [place] [address] [place], [place] [address]       Radiology Studies: DG Chest Port 1 View  Result Date: 01/16/2021 CLINICAL DATA:  Shortness of breath with wheezing EXAM: PORTABLE CHEST 1 VIEW COMPARISON:  11/07/2020 FINDINGS: Hazy opacification of the bilateral chest. No Kerley lines or effusion. Lung volumes are low. Stable generous heart size accentuated by technique. IMPRESSION: Hazy bilateral airspace disease that could be edema or atypical pneumonia. Electronically Signed   By: Monte Fantasia M.D.   On: 01/16/2021  04:16    Scheduled Meds:  apixaban  5 mg Oral BID   aspirin EC  81 mg Oral Daily   atorvastatin  80 mg Oral QPM   carvedilol  6.25 mg Oral BID WC   furosemide  40 mg Intravenous BID   hydrALAZINE  25 mg Oral TID   insulin aspart  0-15 Units Subcutaneous TID WC   insulin glargine  40 Units Subcutaneous QHS   sodium chloride flush  3 mL Intravenous Q12H   Continuous Infusions:  sodium chloride       LOS: 1 day   Time spent: 35 minutes   Cassandra Cheney, MD [facility]  01/17/2021, 11:16 AM   How to contact the [facility] Attending or Consulting provider [place] or covering provider during after hours [employer], for this patient?  Check the care team in [facility] and look for a) attending/consulting TR[facility] provider listed and b) the [facility] team listed. Page or secure chat [phone]. Log into [email or web address] and use [place]'s universal password to access. If you do not have the password, please contact the hospital operator. Locate the [facility] provider you are looking for under [facility] and page to a number that you can be directly reached. If you still have difficulty reaching the provider, please page the [facility] (Director on Call) for the Hospitalists listed on amion for assistance.

## 2021-01-17 NOTE — Evaluation (Signed)
Physical Therapy Evaluation Patient Details Name: Cassandra Adams MRN: [id number] DOB: 10/15/1945 Today's Date: 01/17/2021   History of Present Illness  Cassandra Adams is a 75 y.o. female with medical history significant of HTN, diastolic CHF, PAF on Eliquis, CAD, s/p right endarterectomy TIA, DM type II, and CKD stage III who presents with complaints of shortness of breath that woke her out of her sleep this morning around 2 AM.  Noted associated symptoms included a sharp pain underneath her left breast, cough, runny nose, and wheezing.   Clinical Impression  Patient received in bed with grandson at bedside. She reports pain in left knee and left toes feeling tingly. Not new per her report. She is mod independent with bed mobility. Patient requires cues and min assist for sit to stand from bed. Ambulated in room 50 feet with RW and min guard to supervision. She has no lob with mobility. O2 sats while ambulating in room on room air at 93%. Patient had pain in hip when sitting back down on bed.  Patient returned to bed on 1 liter of O2. PA in room at end of session. Patient will continue to benefit from skilled PT while here to improve functional mobility and safety.     Follow Up Recommendations Home health PT;Supervision - Intermittent    Equipment Recommendations  None recommended by PT    Recommendations for Other Services       Precautions / Restrictions Precautions Precaution Comments: mod fall Restrictions Weight Bearing Restrictions: No      Mobility  Bed Mobility Overal bed mobility: Modified Independent             General bed mobility comments: increased time    Transfers Overall transfer level: Needs assistance Equipment used: Rolling walker (2 wheeled) Transfers: Sit to/from Stand Sit to Stand: Min assist         General transfer comment: cues to push up from bed  Ambulation/Gait Ambulation/Gait assistance: Supervision Gait Distance (Feet):  50 Feet Assistive device: Rolling walker (2 wheeled) Gait Pattern/deviations: Step-through pattern;Decreased stride length Gait velocity: decr   General Gait Details: patient ambulated in room with RW and min guard/supervision. No lob  Stairs            Wheelchair Mobility    Modified Rankin (Stroke Patients Only)       Balance Overall balance assessment: Modified Independent                                           Pertinent Vitals/Pain Pain Assessment: Faces Faces Pain Scale: Hurts little more Pain Location: R hip Pain Descriptors / Indicators: Discomfort Pain Intervention(s): Monitored during session;Repositioned    Home Living Family/patient expects to be discharged to:: Private residence Living Arrangements: Alone Available Help at Discharge: Family;Available PRN/intermittently Type of Home: Apartment Home Access: Level entry     Home Layout: One level Home Equipment: Cane - single point;Walker - 2 wheels;Shower seat Additional Comments: patient does not drive    Prior Function Level of Independence: Independent with assistive device(s)         Comments: PT has RW and a cane, uses cane primarily     Hand Dominance   Dominant Hand: Right    Extremity/Trunk Assessment   Upper Extremity Assessment Upper Extremity Assessment: Overall WFL for tasks assessed    Lower Extremity Assessment Lower Extremity Assessment: Overall  WFL for tasks assessed    Cervical / Trunk Assessment Cervical / Trunk Assessment: Normal  Communication   Communication: No difficulties  Cognition Arousal/Alertness: Awake/alert Behavior During Therapy: WFL for tasks assessed/performed Overall Cognitive Status: Within Functional Limits for tasks assessed                                        General Comments      Exercises     Assessment/Plan    PT Assessment Patient needs continued PT services  PT Problem List Decreased  strength;Decreased mobility;Decreased activity tolerance;Pain;Decreased safety awareness;Decreased knowledge of use of DME       PT Treatment Interventions DME instruction;Therapeutic exercise;Gait training;Functional mobility training;Therapeutic activities;Patient/family education    PT Goals (Current goals can be found in the Care Plan section)  Acute Rehab PT Goals Patient Stated Goal: to return home PT Goal Formulation: With patient/family Time For Goal Achievement: 01/24/21 Potential to Achieve Goals: Good    Frequency Min 3X/week   Barriers to discharge Decreased caregiver support      Co-evaluation               AM-PAC PT "6 Clicks" Mobility  Outcome Measure Help needed turning from your back to your side while in a flat bed without using bedrails?: None Help needed moving from lying on your back to sitting on the side of a flat bed without using bedrails?: A Little Help needed moving to and from a bed to a chair (including a wheelchair)?: A Little Help needed standing up from a chair using your arms (e.g., wheelchair or bedside chair)?: A Little Help needed to walk in hospital room?: A Little Help needed climbing 3-5 steps with a railing? : A Little 6 Click Score: 19    End of Session Equipment Utilized During Treatment: Gait belt;Oxygen Activity Tolerance: Patient tolerated treatment well Patient left: in bed;with call bell/phone within reach;with family/visitor present Nurse Communication: Mobility status PT Visit Diagnosis: Muscle weakness (generalized) (M62.81)    Time: 1000-1025 PT Time Calculation (min) (ACUTE ONLY): 25 min   Charges:   PT Evaluation $PT Eval Moderate Complexity: 1 Mod PT Treatments $Gait Training: 8-22 mins        Asani Deniston, PT, GCS 01/17/21,10:38 AM

## 2021-01-17 NOTE — Progress Notes (Signed)
Nutrition Education Note  RD consulted for nutrition education regarding a heart healthy consistent carbohydrate diet. Spoke with patient and her grandson at bedside. Patient states that she does not follow a specific diet at home, but she usually eats baked meats and avoids fried foods. She drinks a lot of regular soda; she does not like diet soda. She does not eat fruits very often and the only vegetables she really likes is canned peas and canned green beans. She eats easy to prepare food, such as canned beanie weenies, a lot because she does not feel like cooking.    Lab Results  Component Value Date   HGBA1C 8.2 (H) 11/06/2020    Lipid Panel     Component Value Date/Time   CHOL 144 01/16/2021 0933   CHOL 128 06/09/2020 0802   TRIG 61 01/16/2021 0933   HDL 69 01/16/2021 0933   HDL 61 06/09/2020 0802   CHOLHDL 2.1 01/16/2021 0933   VLDL 12 01/16/2021 0933   LDLCALC 63 01/16/2021 0933   LDLCALC 52 06/09/2020 0802    RD provided "Heart Healthy Consistent Carbohydrate Nutrition Therapy" handout from the Academy of Nutrition and Dietetics. Reviewed patient's dietary recall. Provided examples on ways to decrease sodium and fat intake in diet. Discouraged intake of processed foods and use of salt shaker. Encouraged fresh fruits and vegetables as well as whole grain sources of carbohydrates to maximize fiber intake. Discussed importance of controlled and consistent carbohydrate intake throughout the day. Provided examples of ways to balance meals/snacks and encouraged intake of high-fiber, whole grain complex carbohydrates. Reviewed low sugar beverage options with patient. Teach back method used.  Expect fair compliance. Outpatient education would be beneficial for reinforcement.   Body mass index is 28.86 kg/m. Pt meets criteria for overweight based on current BMI.  Current diet order is heart healthy carbohydrate modified, patient is consuming approximately 100% of meals at this time.  Labs and medications reviewed. No further nutrition interventions warranted at this time. RD contact information provided. If additional nutrition issues arise, please re-consult RD.   Lucas Mallow, RD, LDN, CNSC Please refer to [facility] for contact information.

## 2021-01-17 NOTE — Progress Notes (Signed)
  Echocardiogram 2D Echocardiogram has been performed.  Cassandra Adams 01/17/2021, 8:48 AM

## 2021-01-18 DIAGNOSIS — J810 Acute pulmonary edema: Secondary | ICD-10-CM | POA: Diagnosis not present

## 2021-01-18 DIAGNOSIS — N179 Acute kidney failure, unspecified: Secondary | ICD-10-CM | POA: Diagnosis not present

## 2021-01-18 DIAGNOSIS — N189 Chronic kidney disease, unspecified: Secondary | ICD-10-CM | POA: Diagnosis not present

## 2021-01-18 LAB — BASIC METABOLIC PANEL
Anion gap: 5 (ref 5–15)
BUN: 30 mg/dL — ABNORMAL HIGH (ref 8–23)
CO2: 32 mmol/L (ref 22–32)
Calcium: 9.9 mg/dL (ref 8.9–10.3)
Chloride: 101 mmol/L (ref 98–111)
Creatinine, Ser: 1.71 mg/dL — ABNORMAL HIGH (ref 0.44–1.00)
GFR, Estimated: 31 mL/min — ABNORMAL LOW (ref >60)
Glucose, Bld: 225 mg/dL — ABNORMAL HIGH (ref 70–99)
Potassium: 4.4 mmol/L (ref 3.5–5.1)
Sodium: 138 mmol/L (ref 135–145)

## 2021-01-18 LAB — GLUCOSE, CAPILLARY
Glucose-Capillary: 229 mg/dL — ABNORMAL HIGH (ref 70–99)
Glucose-Capillary: 366 mg/dL — ABNORMAL HIGH (ref 70–99)

## 2021-01-18 NOTE — Progress Notes (Signed)
Inpatient Diabetes Program Recommendations  AACE/ADA: New Consensus Statement on Inpatient Glycemic Control (2015)  Target Ranges:  Prepandial:   less than 140 mg/dL      Peak postprandial:   less than 180 mg/dL (1-2 hours)      Critically ill patients:  140 - 180 mg/dL   Lab Results  Component Value Date   GLUCAP 366 (H) 01/18/2021   HGBA1C 8.2 (H) 11/06/2020    Review of Glycemic Control Results for Cassandra Adams, Cassandra Adams (MRN [id number]) as of 01/18/2021 12:01  Ref. Range 01/17/2021 07:31 01/17/2021 11:58 01/17/2021 16:40 01/17/2021 20:44 01/18/2021 07:44 01/18/2021 11:47  Glucose-Capillary Latest Ref Range: 70 - 99 mg/dL 214 (H) 273 (H) 284 (H) 199 (H) 229 (H) 366 (H)   Diabetes history: DM Outpatient Diabetes medications: Glipizide 2.5 QAM, Basaglar 32 units QHS, Novolog 6-9 units TID Current orders for Inpatient glycemic control: Novolog 0-15 units TID and Lantus 40 units QHS  Inpatient Diabetes Program Recommendations:    Semglee 45 units QHS Novolog 4 units TID with meals IF eats at least 50% Novolog 0-5 units QHS  Will continue to follow while inpatient.  Thank you, Reche Dixon, RN, BSN Diabetes Coordinator Inpatient Diabetes Program [phone] (team pager from 8a-5p)

## 2021-01-18 NOTE — Discharge Summary (Addendum)
Physician Discharge Summary  Cassandra Adams:[id number] DOB: July 03, 1945 DOA: 01/16/2021  PCP: Sharilyn Sites, MD  Admit date: 01/16/2021 Discharge date: 01/18/2021 30 Day Unplanned Readmission Risk Score    Flowsheet Row ED to Hosp-Admission (Current) from 01/16/2021 in [facility]  30 Day Unplanned Readmission Risk Score (%) 23.63 Filed at 01/18/2021 0801       This score is the patient's risk of an unplanned readmission within 30 days of being discharged (0 -100%). The score is based on dignosis, age, lab data, medications, orders, and past utilization.   Low:  0-14.9   Medium: 15-21.9   High: 22-29.9   Extreme: 30 and above          Admitted From: Home Disposition: Home  Recommendations for Outpatient Follow-up:  Follow up with PCP in 1-2 weeks: Recommend PCP address ACP and code status. Follow-up with your primary cardiologist in 2 weeks Please obtain BMP/CBC in one week Please follow up with your PCP on the following pending results: Unresulted Labs (From admission, onward)     Start     Ordered   01/17/21 1478  Basic metabolic panel  Daily,   R      01/16/21 0802              Home Health: Yes Equipment/Devices: Rolling walker  Discharge Condition: Stable CODE STATUS: Full code Diet recommendation: Cardiac  Subjective: Seen and examined.  Feels much better without any shortness of breath.  Has been off of oxygen since last night.  Ready to go home.  Following HPI and ED course has been copied from my colleague hospitalist Dr. Thompson Caul H&P.  HPI: Cassandra Adams is a 75 y.o. female with medical history significant of HTN, diastolic CHF, PAF on Eliquis, CAD, s/p right endarterectomy TIA, DM type II, and CKD stage III who presents with complaints of shortness of breath that woke her out of her sleep this morning around 2 AM.  Noted associated symptoms included a sharp pain underneath her left breast, cough, runny nose, and wheezing.   Denied having any diaphoresis, nausea, vomiting, diarrhea, dysuria, or fever symptoms.  She did not try anything to relieve symptoms while at home, and called her daughter who came over to bring her to the hospital.  Patient reports that she has been sleeping in a recliner due to pain in her left hip for several months.  She had felt  like her leg swelling has been getting better.  The patient's daughter is present at bedside and notes that 6 days ago they had recently decreased her Lasix from twice a day to once a day after recent lab work had shown that her kidney function was going up.  The other night patient had woken up randomly in a cold sweat and did not know why.  Patient had just recently underwent cardioversion 2 days ago with Dr. Debara Pickett.  Over the last several days her daughter noticed that her blood sugars have been running high and intermittently elevated up to 400.  She is followed by endocrinology, but did not notify them of this and reported to eating bacon when asked about her diet.     Records note her last cardiac catheterization was in 2015 which patient was noted to have  occlusion of the mid RCA, mid LAD lesion with 50% stenosis in the proximal area followed by 60 to 70% stenosis, normal left main, and left circumflex artery for which medical management had been recommended.  ED Course: Upon admission into the emergency department patient was seen to be afebrile, pulse 83-1 09, respirations 19-34, blood pressures elevated up to 213/89, O2 saturations were noted to be as low as 90% currently maintained on 2 L of nasal cannula oxygen at 100%.  Labs significant for hemoglobin 10.4, BUN 19, creatinine 1.62, BNP 631.7, and HS-troponin 52->85.  Chest x-ray showed hazy bilateral airspace disease that could be edema or atypical pneumonia.  Patient had received  furosemide 40 mg IV and 1 inch nitroglycerin paste.  She felt her chest pain resolved after nitroglycerin patch had been  placed.  Brief/Interim Summary: Patient was admitted due to acute hypoxic respiratory failure secondary to acute on chronic diastolic congestive heart failure/acute pulmonary edema.  She was started on IV diuretics.  Cardiology was consulted.  She also came in with hypertensive urgency.  Her home medications were resumed.  She also came in with chest pain and had slightly elevated and flat troponins which were not indicated of ACS according to cardiology as well.  Her atrial fibrillation remained under control.  Her rest of the medical issues remained under control.  She also came in with mild AKI superimposed on CKD stage IIIb.  Her renal function is back to her baseline.  She has been weaned down to room air since yesterday.  She has been walking with no hypoxia.  No shortness of breath.  Lungs clear to auscultation.  Limited echo was done which did not show any change from the recent echo.  Cardiology signed off and cleared her for discharge.  She is being discharged and will resume all her home medications including her diuretics.  She will follow with PCP as well as cardiology.  Discharge Diagnoses:  Active Problems:   Subclinical hyperthyroidism   AF (paroxysmal atrial fibrillation) (HCC)   Acute kidney injury superimposed on CKD (HCC)   CAD (coronary artery disease)   Microcytic anemia   Hypertensive urgency   Acute on chronic diastolic (congestive) heart failure (HCC)   Acute pulmonary edema (HCC)    Discharge Instructions   Allergies as of 01/18/2021       Reactions   Penicillins Hives   Has patient had a PCN reaction causing immediate rash, facial/tongue/throat swelling, SOB or lightheadedness with hypotension: no Has patient had a PCN reaction causing severe rash involving mucus membranes or skin necrosis: No  Has patient had a PCN reaction that required hospitalization: no Has patient had a PCN reaction occurring within the last 10 years: no If all of the above answers are  "NO", then may proceed with Cephalosporin use.        Medication List     TAKE these medications    aspirin EC 81 MG tablet Take 1 tablet (81 mg total) by mouth daily.   atorvastatin 80 MG tablet Commonly known as: LIPITOR Take 1 tablet (80 mg total) by mouth every evening.   Basaglar KwikPen 100 UNIT/ML Inject 32 Units into the skin at bedtime.   BD Insulin Syringe U/F 31G X 5/16" 0.5 ML Misc Generic drug: Insulin Syringe-Needle U-100 Use to take with Novolog [facility] What changed:  how much to take how to take this when to take this additional instructions   carvedilol 12.5 MG tablet Commonly known as: COREG Take 0.5 tablets (6.25 mg total) by mouth 2 (two) times daily with a meal.   colesevelam 625 MG tablet Commonly known as: WELCHOL Take 1,875 mg by mouth 2 (two) times daily with a  meal. Noon and night   Eliquis 5 MG Tabs tablet Generic drug: apixaban Take 1 tablet (5 mg total) by mouth 2 (two) times daily.   furosemide 40 MG tablet Commonly known as: LASIX Take 40 mg  one tablet by mouth daily , may take an additional 40 mg if needed daily if leg edema worsens   glipiZIDE 2.5 MG 24 hr tablet Commonly known as: GLUCOTROL XL Take 2.5 mg by mouth daily with breakfast.   hydrALAZINE 25 MG tablet Commonly known as: APRESOLINE Take 25 mg by mouth 3 (three) times daily.   lisinopril 20 MG tablet Commonly known as: ZESTRIL Take 1 tablet (20 mg total) by mouth daily.   nitroGLYCERIN 0.4 MG SL tablet Commonly known as: NITROSTAT Place 1 tablet (0.4 mg total) under the tongue every 5 (five) minutes as needed for chest pain (up to 3 doses).   NovoLOG 100 UNIT/ML injection Generic drug: insulin aspart Inject 6-9 Units into the skin 3 (three) times daily with meals. Sliding Scale   pantoprazole 40 MG tablet Commonly known as: PROTONIX Take 1 tablet (40 mg total) by mouth 2 (two) times daily.   polyethylene glycol 17 g packet Commonly known as: MIRALAX /  GLYCOLAX Take 17 g by mouth daily. What changed: when to take this   senna-docusate 8.6-50 MG tablet Commonly known as: Senokot-S Take 1 tablet by mouth at bedtime as needed for mild constipation. What changed: when to take this   Vitamin D (Cholecalciferol) 25 MCG (1000 UT) Tabs Take 2,000 Units by mouth every other day.        Follow-up Information     Sharilyn Sites, MD Follow up in 1 week(s).   Specialty: Family Medicine Contact information: [place] [address] [phone]         [name], MD .   Specialty: Cardiology Contact information: [address] [place] [address] [phone]                Allergies  Allergen Reactions   Penicillins Hives    Has patient had a PCN reaction causing immediate rash, facial/tongue/throat swelling, SOB or lightheadedness with hypotension: no Has patient had a PCN reaction causing severe rash involving mucus membranes or skin necrosis: No  Has patient had a PCN reaction that required hospitalization: no Has patient had a PCN reaction occurring within the last 10 years: no If all of the above answers are "NO", then may proceed with Cephalosporin use.     Consultations: Cardiology   Procedures/Studies: [facility] Chest Port 1 View  Result Date: 01/16/2021 CLINICAL DATA:  Shortness of breath with wheezing EXAM: PORTABLE CHEST 1 VIEW COMPARISON:  11/07/2020 FINDINGS: Hazy opacification of the bilateral chest. No Kerley lines or effusion. Lung volumes are low. Stable generous heart size accentuated by technique. IMPRESSION: Hazy bilateral airspace disease that could be edema or atypical pneumonia. Electronically Signed   By: Monte Fantasia M.D.   On: 01/16/2021 04:16   ECHOCARDIOGRAM LIMITED  Result Date: 01/17/2021    ECHOCARDIOGRAM LIMITED REPORT   Patient Name:   Cassandra Adams Date of Exam: 01/17/2021 Medical Rec #:  [id number]            Height:       66.0 in Accession  #:    [id number]           Weight:       178.8 lb Date of Birth:  18-Sep-1945  BSA:          1.907 m Patient Age:    7 years             BP:           179/75 mmHg Patient Gender: F                    HR:           69 bpm. Exam Location:  Inpatient Procedure: Limited Echo, Cardiac Doppler, Color Doppler and 3D Echo Indications:    I50.40* Unspecified combined systolic (congestive) and diastolic                 (congestive) heart failure  History:        Patient has prior history of Echocardiogram examinations, most                 recent 11/06/2020. CAD and Previous Myocardial Infarction,                 Abnormal ECG, TIA, Arrythmias:Atrial Fibrillation and                 Bradycardia, Signs/Symptoms:Dyspnea, Shortness of Breath,                 Dizziness/Lightheadedness and Chest Pain; Risk Factors:Diabetes.  Sonographer:    Roseanna Rainbow RDCS Referring Phys: [id number] [facility]  Sonographer Comments: Limited study to "reassess LV EF and previously noted pericardial effusion." IMPRESSIONS  1. "Asymmetrical hypertrophy" appears to be due to scar and thinning of the basal posterior wall and backgroung concentric hypertrophy, rather than primary cardiomyopathy. Apical and mid wall segments are hyperdynamic. Left ventricular ejection fraction, by estimation, is 60 to 65%. Left ventricular ejection fraction by 3D volume is 54 %. The left ventricle has normal function. The left ventricle demonstrates regional wall motion abnormalities (see scoring diagram/findings for description). There is mild asymmetric left ventricular hypertrophy of the septal segment. Left ventricular diastolic function could not be evaluated.  2. Right ventricular systolic function is normal. The right ventricular size is normal. There is normal pulmonary artery systolic pressure. The estimated right ventricular systolic pressure is 45.4 mmHg.  3. Left atrial size was mildly dilated.  4. Mild to moderate mitral valve regurgitation.  5.  The aortic valve is tricuspid. There is mild calcification of the aortic valve. Mild aortic valve sclerosis is present, with no evidence of aortic valve stenosis. FINDINGS  Left Ventricle: "Asymmetrical hypertrophy" appears to be due to scar and thinning of the basal posterior wall and backgroung concentric hypertrophy, rather than primary cardiomyopathy. Apical and mid wall segments are hyperdynamic. Left ventricular ejection fraction, by estimation, is 60 to 65%. Left ventricular ejection fraction by 3D volume is 54 %. The left ventricle has normal function. The left ventricle demonstrates regional wall motion abnormalities. The left ventricular internal cavity size  was normal in size. There is mild asymmetric left ventricular hypertrophy of the septal segment. Left ventricular diastolic function could not be evaluated.  LV Wall Scoring: The basal inferolateral segment and basal inferior segment are akinetic. Right Ventricle: The right ventricular size is normal. No increase in right ventricular wall thickness. Right ventricular systolic function is normal. There is normal pulmonary artery systolic pressure. The tricuspid regurgitant velocity is 2.43 m/s, and  with an assumed right atrial pressure of 3 mmHg, the estimated right ventricular systolic pressure is 09.8 mmHg. Left Atrium: Left atrial size was mildly dilated. Right Atrium: Right  atrial size was normal in size. Pericardium: There is no evidence of pericardial effusion. Mitral Valve: Mild mitral annular calcification. Mild to moderate mitral valve regurgitation, with centrally-directed jet. Tricuspid Valve: The tricuspid valve is normal in structure. Tricuspid valve regurgitation is mild. Aortic Valve: The aortic valve is tricuspid. There is mild calcification of the aortic valve. Mild aortic valve sclerosis is present, with no evidence of aortic valve stenosis. Pulmonic Valve: The pulmonic valve was not well visualized. Aorta: The aortic root and  ascending aorta are structurally normal, with no evidence of dilitation. IAS/Shunts: No atrial level shunt detected by color flow Doppler. LEFT VENTRICLE PLAX 2D LVIDd:         4.70 cm LVIDs:         3.60 cm LV PW:         1.20 cm         3D Volume EF LV IVS:        1.90 cm         LV 3D EF:    Left LVOT diam:     1.90 cm                      ventricular LVOT Area:     2.84 cm                     ejection                                             fraction by                                             3D volume LV Volumes (MOD)                            is 54 %. LV vol d, MOD    117.0 ml A2C: LV vol d, MOD    43.1 ml       3D Volume EF: A4C:                           3D EF:        54 % LV vol s, MOD    47.8 ml A2C: LV vol s, MOD    18.4 ml A4C: LV SV MOD A2C:   69.2 ml LV SV MOD A4C:   43.1 ml LV SV MOD BP:    41.1 ml IVC IVC diam: 1.50 cm LEFT ATRIUM         Index LA diam:    4.30 cm 2.26 cm/m   AORTA Ao Root diam: 3.50 cm Ao Asc diam:  3.70 cm TRICUSPID VALVE TR Peak grad:   23.6 mmHg TR Vmax:        243.00 cm/s  SHUNTS Systemic Diam: 1.90 cm Dani Gobble Croitoru MD Electronically signed by Sanda Klein MD Signature Date/Time: 01/17/2021/2:14:28 PM    Final      Discharge Exam: Vitals:   01/18/21 0618 01/18/21 0743  BP: (!) 124/45 (!) 129/59  Pulse: 77 66  Resp: 18 20  Temp: (!) 97.4 F (36.3 C) 98.6 F (37 C)  SpO2:  Vitals:   01/17/21 2008 01/17/21 2315 01/18/21 0618 01/18/21 0743  BP: (!) 162/77 (!) 153/70 (!) 124/45 (!) 129/59  Pulse: 67 68 77 66  Resp: 19 18 18 20   Temp: 99.2 F (37.3 C) 98.8 F (37.1 C) (!) 97.4 F (36.3 C) 98.6 F (37 C)  TempSrc: Oral Oral Oral Oral  SpO2:  96%    Weight:   77.7 kg   Height:        General: Pt is alert, awake, not in acute distress Cardiovascular: RRR, S1/S2 +, no rubs, no gallops Respiratory: CTA bilaterally, no wheezing, no rhonchi Abdominal: Soft, NT, ND, bowel sounds + Extremities: no edema, no cyanosis    The results of  significant diagnostics from this hospitalization (including imaging, microbiology, ancillary and laboratory) are listed below for reference.     Microbiology: Recent Results (from the past 240 hour(s))  Resp Panel by RT-PCR (Flu A&B, Covid) Nasopharyngeal Swab     Status: None   Collection Time: 01/16/21  4:44 AM   Specimen: Nasopharyngeal Swab; Nasopharyngeal(NP) swabs in vial transport medium  Result Value Ref Range Status   SARS Coronavirus 2 by RT PCR NEGATIVE NEGATIVE Final    Comment: (NOTE) SARS-CoV-2 target nucleic acids are NOT DETECTED.  The SARS-CoV-2 RNA is generally detectable in upper respiratory specimens during the acute phase of infection. The lowest concentration of SARS-CoV-2 viral copies this assay can detect is 138 copies/mL. A negative result does not preclude SARS-Cov-2 infection and should not be used as the sole basis for treatment or other patient management decisions. A negative result may occur with  improper specimen collection/handling, submission of specimen other than nasopharyngeal swab, presence of viral mutation(s) within the areas targeted by this assay, and inadequate number of viral copies(<138 copies/mL). A negative result must be combined with clinical observations, patient history, and epidemiological information. The expected result is Negative.  Fact Sheet for Patients:  EntrepreneurPulse.com.au  Fact Sheet for Healthcare Providers:  IncredibleEmployment.be  This test is no t yet approved or cleared by the [place] FDA and  has been authorized for detection and/or diagnosis of SARS-CoV-2 by FDA under an Emergency Use Authorization (EUA). This EUA will remain  in effect (meaning this test can be used) for the duration of the COVID-19 declaration under Section 564(b)(1) of the Act, 21 U.S.C.section 360bbb-3(b)(1), unless the authorization is terminated  or revoked sooner.       Influenza A by  PCR NEGATIVE NEGATIVE Final   Influenza B by PCR NEGATIVE NEGATIVE Final    Comment: (NOTE) The Xpert Xpress SARS-CoV-2/FLU/RSV plus assay is intended as an aid in the diagnosis of influenza from Nasopharyngeal swab specimens and should not be used as a sole basis for treatment. Nasal washings and aspirates are unacceptable for Xpert Xpress SARS-CoV-2/FLU/RSV testing.  Fact Sheet for Patients: EntrepreneurPulse.com.au  Fact Sheet for Healthcare Providers: IncredibleEmployment.be  This test is not yet approved or cleared by the [place] FDA and has been authorized for detection and/or diagnosis of SARS-CoV-2 by FDA under an Emergency Use Authorization (EUA). This EUA will remain in effect (meaning this test can be used) for the duration of the COVID-19 declaration under Section 564(b)(1) of the Act, 21 U.S.C. section 360bbb-3(b)(1), unless the authorization is terminated or revoked.  Performed at [facility], [place] [address], [place], [place] [address]      Labs: BNP (last 3 results) Recent Labs    01/16/21 0353  BNP 474.2*   Basic Metabolic  Panel: Recent Labs  Lab 01/16/21 0353 01/16/21 0933 01/17/21 0353 01/18/21 0251  NA 137  --  138 138  K 4.4  --  3.8 4.4  CL 107  --  104 101  CO2 24  --  29 32  GLUCOSE 272*  --  196* 225*  BUN 19  --  25* 30*  CREATININE 1.62*  --  1.53* 1.71*  CALCIUM 9.8  --  9.8 9.9  MG  --  2.0  --   --    Liver Function Tests: No results for input(s): AST, ALT, ALKPHOS, BILITOT, PROT, ALBUMIN in the last 168 hours. No results for input(s): LIPASE, AMYLASE in the last 168 hours. No results for input(s): AMMONIA in the last 168 hours. CBC: Recent Labs  Lab 01/16/21 0353  WBC 6.8  NEUTROABS 5.1  HGB 10.4*  HCT 31.4*  MCV 78.7*  PLT 184   Cardiac Enzymes: No results for input(s): CKTOTAL, CKMB, CKMBINDEX, TROPONINI in the last 168 hours. BNP: Invalid input(s):  POCBNP CBG: Recent Labs  Lab 01/17/21 0731 01/17/21 1158 01/17/21 1640 01/17/21 2044 01/18/21 0744  GLUCAP 214* 273* 284* 199* 229*   D-Dimer No results for input(s): DDIMER in the last 72 hours. Hgb A1c No results for input(s): HGBA1C in the last 72 hours. Lipid Profile Recent Labs    01/16/21 0933  CHOL 144  HDL 69  LDLCALC 63  TRIG 61  CHOLHDL 2.1   Thyroid function studies Recent Labs    01/16/21 0932  TSH 0.363   Anemia work up No results for input(s): VITAMINB12, FOLATE, FERRITIN, TIBC, IRON, RETICCTPCT in the last 72 hours. Urinalysis    Component Value Date/Time   COLORURINE YELLOW 05/05/2019 0821   APPEARANCEUR CLEAR 05/05/2019 0821   LABSPEC 1.019 05/05/2019 0821   PHURINE 5.0 05/05/2019 0821   GLUCOSEU 150 (A) 05/05/2019 0821   HGBUR NEGATIVE 05/05/2019 0821   BILIRUBINUR NEGATIVE 05/05/2019 0821   KETONESUR NEGATIVE 05/05/2019 0821   PROTEINUR 30 (A) 05/05/2019 0821   UROBILINOGEN 0.2 09/15/2012 1010   NITRITE NEGATIVE 05/05/2019 0821   LEUKOCYTESUR LARGE (A) 05/05/2019 0821   Sepsis Labs Invalid input(s): PROCALCITONIN,  WBC,  LACTICIDVEN Microbiology Recent Results (from the past 240 hour(s))  Resp Panel by RT-PCR (Flu A&B, Covid) Nasopharyngeal Swab     Status: None   Collection Time: 01/16/21  4:44 AM   Specimen: Nasopharyngeal Swab; Nasopharyngeal(NP) swabs in vial transport medium  Result Value Ref Range Status   SARS Coronavirus 2 by RT PCR NEGATIVE NEGATIVE Final    Comment: (NOTE) SARS-CoV-2 target nucleic acids are NOT DETECTED.  The SARS-CoV-2 RNA is generally detectable in upper respiratory specimens during the acute phase of infection. The lowest concentration of SARS-CoV-2 viral copies this assay can detect is 138 copies/mL. A negative result does not preclude SARS-Cov-2 infection and should not be used as the sole basis for treatment or other patient management decisions. A negative result may occur with  improper specimen  collection/handling, submission of specimen other than nasopharyngeal swab, presence of viral mutation(s) within the areas targeted by this assay, and inadequate number of viral copies(<138 copies/mL). A negative result must be combined with clinical observations, patient history, and epidemiological information. The expected result is Negative.  Fact Sheet for Patients:  EntrepreneurPulse.com.au  Fact Sheet for Healthcare Providers:  IncredibleEmployment.be  This test is no t yet approved or cleared by the [place] FDA and  has been authorized for detection and/or diagnosis of SARS-CoV-2 by  FDA under an Emergency Use Authorization (EUA). This EUA will remain  in effect (meaning this test can be used) for the duration of the COVID-19 declaration under Section 564(b)(1) of the Act, 21 U.S.C.section 360bbb-3(b)(1), unless the authorization is terminated  or revoked sooner.       Influenza A by PCR NEGATIVE NEGATIVE Final   Influenza B by PCR NEGATIVE NEGATIVE Final    Comment: (NOTE) The Xpert Xpress SARS-CoV-2/FLU/RSV plus assay is intended as an aid in the diagnosis of influenza from Nasopharyngeal swab specimens and should not be used as a sole basis for treatment. Nasal washings and aspirates are unacceptable for Xpert Xpress SARS-CoV-2/FLU/RSV testing.  Fact Sheet for Patients: EntrepreneurPulse.com.au  Fact Sheet for Healthcare Providers: IncredibleEmployment.be  This test is not yet approved or cleared by the [place] FDA and has been authorized for detection and/or diagnosis of SARS-CoV-2 by FDA under an Emergency Use Authorization (EUA). This EUA will remain in effect (meaning this test can be used) for the duration of the COVID-19 declaration under Section 564(b)(1) of the Act, 21 U.S.C. section 360bbb-3(b)(1), unless the authorization is terminated or revoked.  Performed at [facility], [place] [address], [place], [place] [address]      Time coordinating discharge: Over 30 minutes  SIGNED:   Darliss Cheney, MD  [facility] 01/18/2021, 8:25 AM  If 7PM-7AM, please contact night-coverage [email or web address]

## 2021-01-18 NOTE — Plan of Care (Signed)

## 2021-01-18 NOTE — TOC Transition Note (Signed)
Transition of Care [facility]) - CM/SW Discharge Note   Patient Details  Name: Cassandra Adams MRN: [id number] Date of Birth: Mar 07, 1946  Transition of Care [facility]) CM/SW Contact:  Tom-Johnson, Renea Ee, RN Phone Number: 01/18/2021, 2:25 PM   Clinical Narrative:    Case Manager consulted for [place] [facility] and also DME 3in1 and [place]. Patient stated she used [place] last November and would like to use them at this time. Patient stated her address, correct contact number, and her PCP. [facility] contacted and agreed to give services. Patient states she has a rolling walker but its old and rusted so she needs a new walker. She states she has a shower seat and hand rails. [employer] contacted for DME 3in1 and it will be delivered at bedside for home use.   Final next level of care: [place] Barriers to Discharge: Barriers Resolved   Patient Goals and CMS Choice Patient states their goals for this hospitalization and ongoing recovery are:: To go home. CMS Medicare.gov Compare Post Acute Care list provided to:: Patient Choice offered to / list presented to : Patient  Discharge Placement                       Discharge Plan and Services   Discharge Planning Services: CM Consult Post Acute Care Choice: Home Health          DME Arranged: 3-N-1, Walker rolling DME Agency: [facility] Date DME Agency Contacted: 01/18/21 Time DME Agency Contacted: 28 Representative spoke with at DME Agency: Freda Munro HH Arranged: PT [place]: [place] ([place]) Date [place]: 01/18/21 Time [place]: 1300 Representative spoke with at [place]: [place] ([place]) Interventions     Readmission Risk Interventions Readmission Risk Prevention Plan 05/09/2019  Transportation Screening Complete  Home Care Screening Complete  Medication Review (RN CM) Complete  Some recent data  might be hidden

## 2021-01-18 NOTE — Care Management Note (Signed)
1:20 pm - [employer] services called for DME 3 in 1 and rolling walker. Will be delivered to patient's room.

## 2021-01-18 NOTE — Discharge Instructions (Signed)

## 2021-01-18 NOTE — Progress Notes (Signed)
Physical Therapy Treatment Patient Details Name: Cassandra Adams MRN: [id number] DOB: 26-May-1946 Today's Date: 01/18/2021    History of Present Illness Pt is a 75 y.o. female admitted 01/16/21 with SOB, chest pain, cough. CXR with hazy bilateral airspace disease, could be edema or atypical PNA. Chest pain resolved with NG patch. Workup for acute hypoxic respiratory failure secondary to acute on chronic diastolic CHF exacerbation with pulmonary edema, hypertensive urgency. PMH includes HTN, CHF, PAF on Eliquis, CAD, TIA, DM2, CKD 3.   PT Comments    Pt progressing well with mobility. Today's session focused on transfer and gait training with RW; pt demonstrates improved stability and activity tolerance. Pt preparing for d/c home today. Reviewed educ re: DME needs, activity recommendations, fall risk reduction. If to remain admitted, will continue to follow acutely.    Follow Up Recommendations  Home health PT;Supervision - Intermittent     Equipment Recommendations  Rolling walker with 5" wheels;3in1 (PT)    Recommendations for Other Services       Precautions / Restrictions Precautions Precautions: Fall Restrictions Weight Bearing Restrictions: No    Mobility  Bed Mobility Overal bed mobility: Modified Independent             General bed mobility comments: HOB elevated    Transfers Overall transfer level: Modified independent Equipment used: Rolling walker (2 wheeled) Transfers: Sit to/from Stand           General transfer comment: Multiple sit<>stands from EOB, recliner and chair with and without RW, mod indep with use of momentum to power up  Ambulation/Gait Ambulation/Gait assistance: Supervision Gait Distance (Feet): 200 Feet Assistive device: Rolling walker (2 wheeled) Gait Pattern/deviations: Step-through pattern;Decreased stride length Gait velocity: Decreased   General Gait Details: Slow, steady gait with RW and supervision for safety; endorses R  posterior hip/thigh pain (chronic), which seemed to improve with increased ambulation distance; pt declined gait training with SPC or no DME   Stairs             Wheelchair Mobility    Modified Rankin (Stroke Patients Only)       Balance Overall balance assessment: Needs assistance   Sitting balance-Leahy Scale: Good       Standing balance-Leahy Scale: Fair Standing balance comment: Can static stand at sink to perform ADL tasks without UE support; dynamic stability improved with RW                            Cognition Arousal/Alertness: Awake/alert Behavior During Therapy: WFL for tasks assessed/performed Overall Cognitive Status: Within Functional Limits for tasks assessed                                 General Comments: WFL for simple tasks, not formally assessed; suspect near baseline cognition      Exercises      General Comments General comments (skin integrity, edema, etc.): Pt reports plan to use RW at home, also requesting BSC to raise toilet height. Pt reports grandson likely to drive her home and can assist with DME set-up      Pertinent Vitals/Pain Pain Assessment: Faces Faces Pain Scale: Hurts a little bit Pain Location: R hip Pain Descriptors / Indicators: Discomfort Pain Intervention(s): Monitored during session    Home Living  Prior Function            PT Goals (current goals can now be found in the care plan section) Progress towards PT goals: Progressing toward goals    Frequency    Min 3X/week      PT Plan Current plan remains appropriate    Co-evaluation              AM-PAC PT "6 Clicks" Mobility   Outcome Measure  Help needed turning from your back to your side while in a flat bed without using bedrails?: None Help needed moving from lying on your back to sitting on the side of a flat bed without using bedrails?: None Help needed moving to and from a bed to a  chair (including a wheelchair)?: None Help needed standing up from a chair using your arms (e.g., wheelchair or bedside chair)?: None Help needed to walk in hospital room?: A Little Help needed climbing 3-5 steps with a railing? : A Little 6 Click Score: 22    End of Session Equipment Utilized During Treatment: Gait belt Activity Tolerance: Patient tolerated treatment well Patient left: in chair;with call bell/phone within reach Nurse Communication: Mobility status PT Visit Diagnosis: Muscle weakness (generalized) (M62.81)     Time: 9774-1423 PT Time Calculation (min) (ACUTE ONLY): 19 min  Charges:  $Gait Training: 8-22 mins                    Mabeline Caras, PT, DPT Acute Rehabilitation Services  Pager [phone] Office [phone]  Cassandra Adams 01/18/2021, 10:13 AM

## 2021-01-25 ENCOUNTER — Encounter (HOSPITAL_COMMUNITY): Payer: Self-pay | Admitting: Physician Assistant

## 2021-01-25 ENCOUNTER — Other Ambulatory Visit: Payer: Self-pay

## 2021-01-25 ENCOUNTER — Ambulatory Visit (HOSPITAL_COMMUNITY)
Admit: 2021-01-25 | Discharge: 2021-01-25 | Disposition: A | Discharge status: Home / Self Care | Payer: Medicare HMO | Attending: Physician Assistant | Admitting: Physician Assistant

## 2021-01-25 VITALS — BP 114/58 | HR 51 | Ht 66.0 in | Wt 178.2 lb

## 2021-01-25 DIAGNOSIS — Z79899 Other long term (current) drug therapy: Secondary | ICD-10-CM | POA: Insufficient documentation

## 2021-01-25 DIAGNOSIS — Z7984 Long term (current) use of oral hypoglycemic drugs: Secondary | ICD-10-CM | POA: Insufficient documentation

## 2021-01-25 DIAGNOSIS — I6521 Occlusion and stenosis of right carotid artery: Secondary | ICD-10-CM | POA: Insufficient documentation

## 2021-01-25 DIAGNOSIS — I130 Hypertensive heart and chronic kidney disease with heart failure and stage 1 through stage 4 chronic kidney disease, or unspecified chronic kidney disease: Secondary | ICD-10-CM | POA: Diagnosis not present

## 2021-01-25 DIAGNOSIS — E1122 Type 2 diabetes mellitus with diabetic chronic kidney disease: Secondary | ICD-10-CM | POA: Insufficient documentation

## 2021-01-25 DIAGNOSIS — I2510 Atherosclerotic heart disease of native coronary artery without angina pectoris: Secondary | ICD-10-CM | POA: Diagnosis not present

## 2021-01-25 DIAGNOSIS — Z7982 Long term (current) use of aspirin: Secondary | ICD-10-CM | POA: Insufficient documentation

## 2021-01-25 DIAGNOSIS — D6869 Other thrombophilia: Secondary | ICD-10-CM

## 2021-01-25 DIAGNOSIS — E785 Hyperlipidemia, unspecified: Secondary | ICD-10-CM | POA: Insufficient documentation

## 2021-01-25 DIAGNOSIS — I5032 Chronic diastolic (congestive) heart failure: Secondary | ICD-10-CM | POA: Diagnosis not present

## 2021-01-25 DIAGNOSIS — I4819 Other persistent atrial fibrillation: Secondary | ICD-10-CM

## 2021-01-25 DIAGNOSIS — Z7901 Long term (current) use of anticoagulants: Secondary | ICD-10-CM | POA: Diagnosis not present

## 2021-01-25 DIAGNOSIS — Z794 Long term (current) use of insulin: Secondary | ICD-10-CM | POA: Insufficient documentation

## 2021-01-25 DIAGNOSIS — Z880 Allergy status to penicillin: Secondary | ICD-10-CM | POA: Diagnosis not present

## 2021-01-25 DIAGNOSIS — N183 Chronic kidney disease, stage 3 unspecified: Secondary | ICD-10-CM | POA: Diagnosis not present

## 2021-01-25 DIAGNOSIS — Z8673 Personal history of transient ischemic attack (TIA), and cerebral infarction without residual deficits: Secondary | ICD-10-CM | POA: Diagnosis not present

## 2021-01-25 DIAGNOSIS — Z8249 Family history of ischemic heart disease and other diseases of the circulatory system: Secondary | ICD-10-CM | POA: Insufficient documentation

## 2021-01-25 LAB — CBC
HCT: 31.6 % — ABNORMAL LOW (ref 36.0–46.0)
Hemoglobin: 10.6 g/dL — ABNORMAL LOW (ref 12.0–15.0)
MCH: 26.1 pg (ref 26.0–34.0)
MCHC: 33.5 g/dL (ref 30.0–36.0)
MCV: 77.8 fL — ABNORMAL LOW (ref 80.0–100.0)
Platelets: 169 10*3/uL (ref 150–400)
RBC: 4.06 MIL/uL (ref 3.87–5.11)
RDW: 14.8 % (ref 11.5–15.5)
WBC: 4 10*3/uL (ref 4.0–10.5)
nRBC: 0 % (ref 0.0–0.2)

## 2021-01-25 LAB — BASIC METABOLIC PANEL
Anion gap: 7 (ref 5–15)
BUN: 36 mg/dL — ABNORMAL HIGH (ref 8–23)
CO2: 26 mmol/L (ref 22–32)
Calcium: 10.1 mg/dL (ref 8.9–10.3)
Chloride: 107 mmol/L (ref 98–111)
Creatinine, Ser: 1.6 mg/dL — ABNORMAL HIGH (ref 0.44–1.00)
GFR, Estimated: 33 mL/min — ABNORMAL LOW (ref >60)
Glucose, Bld: 116 mg/dL — ABNORMAL HIGH (ref 70–99)
Potassium: 4.4 mmol/L (ref 3.5–5.1)
Sodium: 140 mmol/L (ref 135–145)

## 2021-01-25 NOTE — Progress Notes (Addendum)
Primary Care Physician: Sharilyn Sites, MD Primary Cardiologist: Dr Debara Pickett  Primary Electrophysiologist: none Referring Physician: Dr Myrtis Ser is a 75 y.o. female with a history of CAD, HFpEF, atrial fibrillation, carotid artery stenosis (s/p R CEA), HTN, HLD, Type 2 DM, Stage 3 CKD, prior TIA , and atrial fibrillation who presents for consultation in the [facility]. She was admitted to [facility] in 10/2020 after suffering a fall and it was unclear if she had a syncopal episode. Was found to be in atrial fibrillation that admission but Cardiology was not consulted at that time. She did follow-up with Almyra Deforest, PA-C as an outpatient and was volume overloaded on examination and Lasix was titrated to 40mg  BID. She had missed a dose of Eliquis and was not a candidate for DCCV. Most recent follow-up was on 01/05/2021 and she was still in atrial fibrillation with RVR and it was recommended to proceed with DCCV. She underwent DCCV by Dr. Debara Pickett on 01/14/2021 and converted back to NSR. Patient is on Eliquis for a CHADS2VASC score of 9.  She presented to [facility] 01/16/21 with progressive dyspnea after her DCCV. She was admitted and diuresed. She maintained SR througout her hospitalization. Today, she feels she is slowly improving but not back to her baseline yet. Her weight and swelling are stable. She denies any bleeding issues on anticoagulation.   Today, she denies symptoms of palpitations, chest pain, shortness of breath, orthopnea, PND, dizziness, presyncope, syncope, snoring, daytime somnolence, bleeding, or neurologic sequela. The patient is tolerating medications without difficulties and is otherwise without complaint today.    Atrial Fibrillation Risk Factors:  she does not have symptoms or diagnosis of sleep apnea. she does not have a history of rheumatic fever.   she has a BMI of Body mass index is 28.76 kg/m.[name] Filed Weights   01/25/21 1036   Weight: 80.8 kg    Family History  Problem Relation Age of Onset   Hyperlipidemia Mother    Hypertension Mother    Heart attack Mother    Cancer Father        lung   Hypertension Sister    Diabetes Brother    Heart disease Brother    Hyperlipidemia Brother    Hypertension Brother    Heart attack Brother    Other Brother        DVT     Atrial Fibrillation Management history:  Previous antiarrhythmic drugs: none Previous cardioversions: 2015, 01/14/21 Previous ablations: none CHADS2VASC score: 9 Anticoagulation history: Eliquis   Past Medical History:  Diagnosis Date   Arthritis    "all over"   CAD (coronary artery disease)    a. NSTEMI 12/2013 - occluded dominant RCA with L-R collaterals, moderate prox segmental LAD disease, for medical therapy initially, EF 60% with subtle inferobasal hypokinesia. Consider PCI for refractory CP.   CKD (chronic kidney disease), stage III ([place])    Hypertension    Migraines    "weekly" (01/06/2014)   Sickle cell trait ([place])    TIA (transient ischemic attack) 1978   Type II diabetes mellitus ([place])    Vertigo    Past Surgical History:  Procedure Laterality Date   APPENDECTOMY  March 2014   had appendix frozen   CARDIAC CATHETERIZATION  "years ago" & 01/05/2014   CARDIOVERSION N/A 03/17/2014   Procedure: CARDIOVERSION;  Surgeon: [name], MD;  Location: [facility];  Service: Cardiovascular;  Laterality: N/A;   CARDIOVERSION  N/A 01/14/2021   Procedure: CARDIOVERSION;  Surgeon: [name], MD;  Location[facility] ENDOSCOPY;  Service: Cardiovascular;  Laterality: N/A;   CATARACT EXTRACTION W/ INTRAOCULAR LENS  IMPLANT, BILATERAL Bilateral 04/2013-05/2013   ENDARTERECTOMY Right 02/05/2017   Procedure: ENDARTERECTOMY CAROTID-RIGHT;  Surgeon: [name], MD;  Location: [facility] OR;  Service: Vascular;  Laterality: Right;   ESOPHAGOGASTRODUODENOSCOPY  11/08/2007    Normal esophagus without evidence of Barrett, mass, erosion/ Normal  stomach, duodenal bulb   ESOPHAGOGASTRODUODENOSCOPY (EGD) WITH PROPOFOL N/A 11/08/2020   Procedure: ESOPHAGOGASTRODUODENOSCOPY (EGD) WITH PROPOFOL;  Surgeon: Thornton Park, MD;  Location: [facility];  Service: Gastroenterology;  Laterality: N/A;   GASTRIC MOTILITY STUDY  11/13/2007   mildly delayed emptying subjectively, but normal  analysis-77% of tracer emptied at 2 hours   JOINT REPLACEMENT     LAPAROSCOPIC APPENDECTOMY N/A 09/15/2012   Procedure: APPENDECTOMY LAPAROSCOPIC;  Surgeon: [name], MD;  Location: [facility];  Service: General;  Laterality: N/A;   LEFT HEART CATHETERIZATION WITH CORONARY ANGIOGRAM N/A 01/05/2014   Procedure: LEFT HEART CATHETERIZATION WITH CORONARY ANGIOGRAM;  Surgeon: [name], MD;  Location: [facility] CATH LAB;  Service: Cardiovascular;  Laterality: N/A;   REPLACEMENT TOTAL KNEE Right 05-03-06   TEE WITHOUT CARDIOVERSION N/A 03/17/2014   Procedure: TRANSESOPHAGEAL ECHOCARDIOGRAM (TEE);  Surgeon: [name], MD;  Location: [facility] ENDOSCOPY;  Service: Cardiovascular;  Laterality: N/A;   TONSILLECTOMY  1972    Current Outpatient Medications  Medication Sig Dispense Refill   aspirin EC 81 MG tablet Take 1 tablet (81 mg total) by mouth daily. 30 tablet 11   atorvastatin (LIPITOR) 80 MG tablet Take 1 tablet (80 mg total) by mouth every evening. 90 tablet 3   BD INSULIN SYRINGE U/F 31G X 5/16" 0.5 ML MISC Use to take with Novolog TIDAC 100 each 5   carvedilol (COREG) 12.5 MG tablet Take 0.5 tablets (6.25 mg total) by mouth 2 (two) times daily with a meal. 30 tablet 2   colesevelam (WELCHOL) 625 MG tablet Take 1,875 mg by mouth 2 (two) times daily with a meal. Noon and night     ELIQUIS 5 MG TABS tablet Take 1 tablet (5 mg total) by mouth 2 (two) times daily. 180 tablet 1   furosemide (LASIX) 40 MG tablet Take 40 mg  one tablet by mouth daily , may take an additional 40 mg if needed daily if leg edema worsens 180 tablet 3   glipiZIDE (GLUCOTROL XL) 2.5 MG 24 hr  tablet Take 2.5 mg by mouth daily with breakfast.     hydrALAZINE (APRESOLINE) 25 MG tablet Take 25 mg by mouth 3 (three) times daily.     Insulin Glargine (BASAGLAR KWIKPEN) 100 UNIT/ML Inject 32 Units into the skin at bedtime. 30 mL 2   lisinopril (ZESTRIL) 20 MG tablet Take 1 tablet (20 mg total) by mouth daily. 90 tablet 3   nitroGLYCERIN (NITROSTAT) 0.4 MG SL tablet Place 1 tablet (0.4 mg total) under the tongue every 5 (five) minutes as needed for chest pain (up to 3 doses). 25 tablet 11   NOVOLOG 100 UNIT/ML injection Inject 6-9 Units into the skin 3 (three) times daily with meals. Sliding Scale     pantoprazole (PROTONIX) 40 MG tablet Take 1 tablet (40 mg total) by mouth 2 (two) times daily. 168 tablet 0   polyethylene glycol (MIRALAX / GLYCOLAX) 17 g packet Take 17 g by mouth daily. 14 each 0   senna-docusate (SENOKOT-S) 8.6-50 MG tablet Take  1 tablet by mouth at bedtime as needed for mild constipation. 30 tablet 0   Vitamin D, Cholecalciferol, 25 MCG (1000 UT) TABS Take 2,000 Units by mouth every other day.     No current facility-administered medications for this encounter.    Allergies  Allergen Reactions   Penicillins Hives    Has patient had a PCN reaction causing immediate rash, facial/tongue/throat swelling, SOB or lightheadedness with hypotension: no Has patient had a PCN reaction causing severe rash involving mucus membranes or skin necrosis: No  Has patient had a PCN reaction that required hospitalization: no Has patient had a PCN reaction occurring within the last 10 years: no If all of the above answers are "NO", then may proceed with Cephalosporin use.     Social History   Socioeconomic History   Marital status: Single    Spouse name: Not on file   Number of children: Not on file   Years of education: Not on file   Highest education level: Not on file  Occupational History   Not on file  Tobacco Use   Smoking status: Never   Smokeless tobacco: Never  Vaping  Use   Vaping Use: Never used  Substance and Sexual Activity   Alcohol use: No   Drug use: No   Sexual activity: Not Currently    Birth control/protection: Post-menopausal  Other Topics Concern   Not on file  Social History Narrative   Not on file   Social Determinants of Health   Financial Resource Strain: Not on file  Food Insecurity: Not on file  Transportation Needs: Not on file  Physical Activity: Not on file  Stress: Not on file  Social Connections: Not on file  Intimate Partner Violence: Not on file     ROS- All systems are reviewed and negative except as per the HPI above.  Physical Exam: Vitals:   01/25/21 1036  BP: (!) 114/58  Pulse: (!) 51  Weight: 80.8 kg  Height: 5\' 6"  (1.676 m)    GEN- The patient is a well appearing elderly female, alert and oriented x 3 today.   Head- normocephalic, atraumatic Eyes-  Sclera clear, conjunctiva pink Ears- hearing intact Oropharynx- clear Neck- supple  Lungs- Clear to ausculation bilaterally, normal work of breathing Heart- Regular rate and rhythm, bradycardia, no murmurs, rubs or gallops  GI- soft, NT, ND, + BS Extremities- no clubbing, cyanosis, or edema MS- no significant deformity or atrophy Skin- no rash or lesion Psych- euthymic mood, full affect Neuro- strength and sensation are intact  Wt Readings from Last 3 Encounters:  01/25/21 80.8 kg  01/18/21 77.7 kg  01/14/21 83.5 kg    EKG today demonstrates  SB Vent. rate 51 BPM PR interval 150 ms QRS duration 88 ms QT/QTcB 428/394 ms  Echo 01/17/21 demonstrated   1. "Asymmetrical hypertrophy" appears to be due to scar and thinning of the basal posterior wall and backgroung concentric hypertrophy, rather than primary cardiomyopathy. Apical and mid wall segments are hyperdynamic. Left ventricular ejection fraction, by estimation, is 60 to 65%. Left ventricular ejection fraction by 3D volume is 54 %. The left ventricle has normal function. The left ventricle  demonstrates regional wall motion abnormalities (see scoring diagram/findings for description). There is mild asymmetric left ventricular hypertrophy of the septal segment. Left ventricular diastolic function could not be evaluated.   2. Right ventricular systolic function is normal. The right ventricular  size is normal. There is normal pulmonary artery systolic pressure. The estimated right  ventricular systolic pressure is 34.7 mmHg.   3. Left atrial size was mildly dilated.   4. Mild to moderate mitral valve regurgitation.   5. The aortic valve is tricuspid. There is mild calcification of the  aortic valve. Mild aortic valve sclerosis is present, with no evidence of  aortic valve stenosis.   Epic records are reviewed at length today  CHA2DS2-VASc Score = 9  The patient's score is based upon: CHF History: Yes HTN History: Yes Diabetes History: Yes Stroke History: Yes Vascular Disease History: Yes Age Score: 2 Gender Score: 1      ASSESSMENT AND PLAN: 1. Persistent Atrial Fibrillation (ICD10:  I48.19) The patient's CHA2DS2-VASc score is 9, indicating a 12.2% annual risk of stroke.   S/p DCCV on 01/14/21 Patient appears to be maintaining SR. Continue Eliquis 5 mg BID Continue carvedilol 12.5 mg BID  2. Secondary Hypercoagulable State (ICD10:  D68.69) The patient is at significant risk for stroke/thromboembolism based upon her CHA2DS2-VASc Score of 9.  Continue Apixaban (Eliquis).   3. CAD No anginal symptoms.  4. Chronic HFpEF Patient diuresed with IV Lasix during her last admission. BNP 631.  She is in a wheelchair today with NYHA class III symptoms. We discussed the Alleviate HF trial today. She is very interested in participating if she is felt to be a good candidate.  Will forward her information to the research team. Check bmet/cbc today.   5. HTN Stable, no changes today.   Follow up with Almyra Deforest and Dr Debara Pickett as scheduled as scheduled.   [facility] [address] [place], [place] [address] [phone] 01/25/2021 10:44 AM

## 2021-01-25 NOTE — Addendum Note (Signed)
Encounter addended by: Oliver Barre, PA on: 01/25/2021 4:17 PM  Actions taken: Clinical Note Signed

## 2021-01-26 ENCOUNTER — Other Ambulatory Visit: Payer: Self-pay | Admitting: Nurse Practitioner

## 2021-02-10 DIAGNOSIS — E559 Vitamin D deficiency, unspecified: Secondary | ICD-10-CM | POA: Diagnosis not present

## 2021-02-10 DIAGNOSIS — Z794 Long term (current) use of insulin: Secondary | ICD-10-CM | POA: Diagnosis not present

## 2021-02-10 DIAGNOSIS — E1122 Type 2 diabetes mellitus with diabetic chronic kidney disease: Secondary | ICD-10-CM | POA: Diagnosis not present

## 2021-02-10 DIAGNOSIS — N1831 Chronic kidney disease, stage 3a: Secondary | ICD-10-CM | POA: Diagnosis not present

## 2021-02-11 LAB — COMPREHENSIVE METABOLIC PANEL
ALT: 16 IU/L (ref 0–32)
AST: 26 IU/L (ref 0–40)
Albumin/Globulin Ratio: 1.7 (ref 1.2–2.2)
Albumin: 4.1 g/dL (ref 3.7–4.7)
Alkaline Phosphatase: 72 IU/L (ref 44–121)
BUN/Creatinine Ratio: 24 (ref 12–28)
BUN: 32 mg/dL — ABNORMAL HIGH (ref 8–27)
Bilirubin Total: <0.2 mg/dL (ref 0.0–1.2)
CO2: 23 mmol/L (ref 20–29)
Calcium: 10 mg/dL (ref 8.7–10.3)
Chloride: 103 mmol/L (ref 96–106)
Creatinine, Ser: 1.34 mg/dL — ABNORMAL HIGH (ref 0.57–1.00)
Globulin, Total: 2.4 g/dL (ref 1.5–4.5)
Glucose: 210 mg/dL — ABNORMAL HIGH (ref 65–99)
Potassium: 4.8 mmol/L (ref 3.5–5.2)
Sodium: 137 mmol/L (ref 134–144)
Total Protein: 6.5 g/dL (ref 6.0–8.5)
eGFR: 41 mL/min/{1.73_m2} — ABNORMAL LOW (ref >59)

## 2021-02-11 LAB — VITAMIN D 25 HYDROXY (VIT D DEFICIENCY, FRACTURES): Vit D, 25-Hydroxy: 31.2 ng/mL (ref 30.0–100.0)

## 2021-02-16 ENCOUNTER — Other Ambulatory Visit: Payer: Self-pay

## 2021-02-16 ENCOUNTER — Ambulatory Visit: Payer: Medicare HMO | Admitting: Nurse Practitioner

## 2021-02-16 ENCOUNTER — Encounter: Payer: Self-pay | Admitting: Nurse Practitioner

## 2021-02-16 VITALS — BP 159/66 | HR 58 | Ht 66.0 in | Wt 176.0 lb

## 2021-02-16 DIAGNOSIS — Z794 Long term (current) use of insulin: Secondary | ICD-10-CM | POA: Diagnosis not present

## 2021-02-16 DIAGNOSIS — N1832 Chronic kidney disease, stage 3b: Secondary | ICD-10-CM | POA: Diagnosis not present

## 2021-02-16 DIAGNOSIS — E1122 Type 2 diabetes mellitus with diabetic chronic kidney disease: Secondary | ICD-10-CM

## 2021-02-16 DIAGNOSIS — E1165 Type 2 diabetes mellitus with hyperglycemia: Secondary | ICD-10-CM | POA: Diagnosis not present

## 2021-02-16 DIAGNOSIS — E559 Vitamin D deficiency, unspecified: Secondary | ICD-10-CM | POA: Diagnosis not present

## 2021-02-16 DIAGNOSIS — I10 Essential (primary) hypertension: Secondary | ICD-10-CM

## 2021-02-16 LAB — POCT GLYCOSYLATED HEMOGLOBIN (HGB A1C): Hemoglobin A1C: 8.4 % — AB (ref 4.0–5.6)

## 2021-02-16 NOTE — Progress Notes (Signed)
02/16/2021, 3:45 PM     Endocrinology follow-up note     Subjective:    Patient ID: Cassandra Adams, female    DOB: 21-Jan-1946.  Cassandra Adams is being seen in follow-up for management of currently uncontrolled symptomatic diabetes requested by  Sharilyn Sites, MD.   Past Medical History:  Diagnosis Date   Arthritis    "all over"   CAD (coronary artery disease)    a. NSTEMI 12/2013 - occluded dominant RCA with L-R collaterals, moderate prox segmental LAD disease, for medical therapy initially, EF 60% with subtle inferobasal hypokinesia. Consider PCI for refractory CP.   CKD (chronic kidney disease), stage III ([place])    Hypertension    Migraines    "weekly" (01/06/2014)   Sickle cell trait ([place])    TIA (transient ischemic attack) 1978   Type II diabetes mellitus ([place])    Vertigo    Past Surgical History:  Procedure Laterality Date   APPENDECTOMY  March 2014   had appendix frozen   CARDIAC CATHETERIZATION  "years ago" & 01/05/2014   CARDIOVERSION N/A 03/17/2014   Procedure: CARDIOVERSION;  Surgeon: [name], MD;  Location: [facility];  Service: Cardiovascular;  Laterality: N/A;   CARDIOVERSION N/A 01/14/2021   Procedure: CARDIOVERSION;  Surgeon: [name], MD;  Location: [place];  Service: Cardiovascular;  Laterality: N/A;   CATARACT EXTRACTION W/ INTRAOCULAR LENS  IMPLANT, BILATERAL Bilateral 04/2013-05/2013   ENDARTERECTOMY Right 02/05/2017   Procedure: ENDARTERECTOMY CAROTID-RIGHT;  Surgeon: [name], MD;  Location: [facility];  Service: Vascular;  Laterality: Right;   ESOPHAGOGASTRODUODENOSCOPY  11/08/2007    Normal esophagus without evidence of Barrett, mass, erosion/ Normal stomach, duodenal bulb   ESOPHAGOGASTRODUODENOSCOPY (EGD) WITH PROPOFOL N/A 11/08/2020   Procedure: ESOPHAGOGASTRODUODENOSCOPY (EGD) WITH PROPOFOL;  Surgeon: Thornton Park, MD;  Location: [facility];  Service: Gastroenterology;  Laterality: N/A;   GASTRIC MOTILITY STUDY  11/13/2007   mildly delayed emptying subjectively, but normal  analysis-77% of tracer emptied at 2 hours   JOINT REPLACEMENT     LAPAROSCOPIC APPENDECTOMY N/A 09/15/2012   Procedure: APPENDECTOMY LAPAROSCOPIC;  Surgeon: [name], MD;  Location: [facility];  Service: General;  Laterality: N/A;   LEFT HEART CATHETERIZATION WITH CORONARY ANGIOGRAM N/A 01/05/2014   Procedure: LEFT HEART CATHETERIZATION WITH CORONARY ANGIOGRAM;  Surgeon: [name], MD;  Location: [facility] C[facility] LAB;  Service: Cardiovascular;  Laterality: N/A;   REPLACEMENT TOTAL KNEE Right 05-03-06   TEE WITHOUT CARDIOVERSION N/A 03/17/2014   Procedure: TRANSESOPHAGEAL ECHOCARDIOGRAM (TEE);  Surgeon: [name], MD;  Location: [facility] ENDOSCOPY;  Service: Cardiovascular;  Laterality: N/A;   TONSILLECTOMY  1972   Social History   Socioeconomic History   Marital status: Single    Spouse name: Not on file   Number of children: Not on file   Years of education: Not on file   Highest education level: Not on file  Occupational History   Not on file  Tobacco Use   Smoking status: Never   Smokeless tobacco: Never  Vaping Use   Vaping Use: Never used  Substance and Sexual Activity   Alcohol use: No   Drug use: No   Sexual activity: Not Currently  Birth control/protection: Post-menopausal  Other Topics Concern   Not on file  Social History Narrative   Not on file   Social Determinants of Health   Financial Resource Strain: Not on file  Food Insecurity: Not on file  Transportation Needs: Not on file  Physical Activity: Not on file  Stress: Not on file  Social Connections: Not on file   Outpatient Encounter Medications as of 02/16/2021  Medication Sig   aspirin EC 81 MG tablet Take 1 tablet (81 mg total) by mouth daily.   atorvastatin (LIPITOR) 80 MG tablet Take 1 tablet (80 mg total) by mouth every evening.   BD INSULIN SYRINGE U/F  31G X 5/16" 0.5 ML MISC Use to take with Novolog TIDAC   carvedilol (COREG) 12.5 MG tablet Take 0.5 tablets (6.25 mg total) by mouth 2 (two) times daily with a meal.   colesevelam (WELCHOL) 625 MG tablet Take 1,875 mg by mouth 2 (two) times daily with a meal. Noon and night   ELIQUIS 5 MG TABS tablet Take 1 tablet (5 mg total) by mouth 2 (two) times daily.   furosemide (LASIX) 40 MG tablet Take 40 mg  one tablet by mouth daily , may take an additional 40 mg if needed daily if leg edema worsens   glipiZIDE (GLUCOTROL XL) 2.5 MG 24 hr tablet Take 2.5 mg by mouth daily with breakfast.   hydrALAZINE (APRESOLINE) 25 MG tablet Take 25 mg by mouth 3 (three) times daily.   Insulin Glargine (BASAGLAR KWIKPEN) 100 UNIT/ML Inject 32 Units into the skin at bedtime.   lisinopril (ZESTRIL) 20 MG tablet Take 1 tablet (20 mg total) by mouth daily.   nitroGLYCERIN (NITROSTAT) 0.4 MG SL tablet Place 1 tablet (0.4 mg total) under the tongue every 5 (five) minutes as needed for chest pain (up to 3 doses).   NOVOLOG 100 UNIT/ML injection Inject 6-9 Units into the skin 3 (three) times daily with meals. Sliding Scale   pantoprazole (PROTONIX) 40 MG tablet Take 1 tablet (40 mg total) by mouth 2 (two) times daily.   polyethylene glycol (MIRALAX / GLYCOLAX) 17 g packet Take 17 g by mouth daily.   senna-docusate (SENOKOT-S) 8.6-50 MG tablet Take 1 tablet by mouth at bedtime as needed for mild constipation.   Vitamin D, Cholecalciferol, 25 MCG (1000 UT) TABS Take 2,000 Units by mouth every other day.   No facility-administered encounter medications on file as of 02/16/2021.    ALLERGIES: Allergies  Allergen Reactions   Penicillins Hives    Has patient had a PCN reaction causing immediate rash, facial/tongue/throat swelling, SOB or lightheadedness with hypotension: no Has patient had a PCN reaction causing severe rash involving mucus membranes or skin necrosis: No  Has patient had a PCN reaction that required  hospitalization: no Has patient had a PCN reaction occurring within the last 10 years: no If all of the above answers are "NO", then may proceed with Cephalosporin use.     VACCINATION STATUS:  There is no immunization history on file for this patient.  Diabetes She presents for her follow-up diabetic visit. She has type 2 diabetes mellitus. Onset time: She was diagnosed at approximate age of 34 years. Her disease course has been stable. Pertinent negatives for hypoglycemia include no confusion, headaches, pallor or seizures. Pertinent negatives for diabetes include no chest pain, no fatigue, no foot paresthesias, no foot ulcerations, no polydipsia, no polyphagia and no polyuria. There are no hypoglycemic complications. Symptoms are stable. Diabetic complications include  heart disease, nephropathy, PVD and retinopathy. Risk factors for coronary artery disease include family history, dyslipidemia, diabetes mellitus, hypertension, sedentary lifestyle and post-menopausal. Current diabetic treatment includes insulin injections and oral agent (monotherapy). She is compliant with treatment most of the time. Her weight is fluctuating minimally. She is following a generally unhealthy diet. When asked about meal planning, she reported none. She has not had a previous visit with a dietitian. She participates in exercise intermittently. Her home blood glucose trend is fluctuating dramatically. (She presents today, accompanied by a family member, with her logs showing drastically fluctuating glycemic profile with random, moderate hypoglycemia.  Her POCT A1c today is 8.4%, worsening from last visit slightly at 8.2%.  She has had less hypoglycemia since we cut her insulin back.  ) An ACE inhibitor/angiotensin II receptor blocker is being taken. She does not see a podiatrist.Eye exam is current (She has retinopathy status post multiple laser therapies on bilateral eyes.).  Hyperlipidemia This is a chronic problem. The  current episode started more than 1 year ago. The problem is controlled. Recent lipid tests were reviewed and are normal. Exacerbating diseases include chronic renal disease and diabetes. Factors aggravating her hyperlipidemia include fatty foods. Pertinent negatives include no chest pain, myalgias or shortness of breath. Current antihyperlipidemic treatment includes statins. The current treatment provides moderate improvement of lipids. Compliance problems include adherence to diet.  Risk factors for coronary artery disease include dyslipidemia, family history, hypertension, a sedentary lifestyle, post-menopausal and diabetes mellitus.  Hypertension This is a chronic problem. The current episode started more than 1 year ago. The problem has been resolved since onset. The problem is controlled. Pertinent negatives include no chest pain, headaches, palpitations or shortness of breath. There are no associated agents to hypertension. Risk factors for coronary artery disease include dyslipidemia, diabetes mellitus, family history, sedentary lifestyle and post-menopausal state. Past treatments include ACE inhibitors, beta blockers and diuretics. The current treatment provides mild improvement. Compliance problems include diet.  Hypertensive end-organ damage includes kidney disease, CAD/MI, PVD and retinopathy. Identifiable causes of hypertension include chronic renal disease.    Review of systems  Constitutional: + Minimally fluctuating body weight,  current Body mass index is 28.41 kg/m. , no fatigue, no subjective hyperthermia, no subjective hypothermia Eyes: no blurry vision, no xerophthalmia ENT: no sore throat, no nodules palpated in throat, no dysphagia/odynophagia, no hoarseness Cardiovascular: no chest pain, no shortness of breath, no palpitations, no leg swelling Respiratory: no cough, no shortness of breath Gastrointestinal: no nausea/vomiting/diarrhea Musculoskeletal: no muscle/joint aches, walks  with cane Skin: no rashes, no hyperemia Neurological: no tremors, no numbness, no tingling, no dizziness Psychiatric: no depression, no anxiety   Objective:    BP (!) 159/66   Pulse (!) 58   Ht 5\' 6"  (1.676 m)   Wt 176 lb (79.8 kg)   BMI 28.41 kg/m   Wt Readings from Last 3 Encounters:  02/16/21 176 lb (79.8 kg)  01/25/21 178 lb 3.2 oz (80.8 kg)  01/18/21 171 lb 6.4 oz (77.7 kg)    BP Readings from Last 3 Encounters:  02/16/21 (!) 159/66  01/25/21 (!) 114/58  01/18/21 (!) 123/59     Physical Exam- Limited  Constitutional:  Body mass index is 28.41 kg/m. , not in acute distress, normal state of mind Eyes:  EOMI, no exophthalmos Neck: Supple Cardiovascular: RRR, no murmurs, rubs, or gallops, no edema Respiratory: Adequate breathing efforts, no crackles, rales, rhonchi, or wheezing Musculoskeletal: no gross deformities, strength intact in all four extremities,  no gross restriction of joint movements, walks with cane Skin:  no rashes, no hyperemia Neurological: no tremor with outstretched hands    CMP     Component Value Date/Time   NA 137 02/10/2021 1618   K 4.8 02/10/2021 1618   CL 103 02/10/2021 1618   CO2 23 02/10/2021 1618   GLUCOSE 210 (H) 02/10/2021 1618   GLUCOSE 116 (H) 01/25/2021 1103   BUN 32 (H) 02/10/2021 1618   CREATININE 1.34 (H) 02/10/2021 1618   CREATININE 1.40 (H) 07/01/2015 0926   CALCIUM 10.0 02/10/2021 1618   PROT 6.5 02/10/2021 1618   ALBUMIN 4.1 02/10/2021 1618   AST 26 02/10/2021 1618   ALT 16 02/10/2021 1618   ALKPHOS 72 02/10/2021 1618   BILITOT <0.2 02/10/2021 1618   GFRNONAA 33 (L) 01/25/2021 1103   GFRAA 48 (L) 06/09/2020 0802    Diabetic Labs (most recent): Lab Results  Component Value Date   HGBA1C 8.4 (A) 02/16/2021   HGBA1C 8.2 (H) 11/06/2020   HGBA1C 8.1 (A) 10/14/2020     Lipid Panel     Component Value Date/Time   CHOL 144 01/16/2021 0933   CHOL 128 06/09/2020 0802   TRIG 61 01/16/2021 0933   HDL 69  01/16/2021 0933   HDL 61 06/09/2020 0802   CHOLHDL 2.1 01/16/2021 0933   VLDL 12 01/16/2021 0933   LDLCALC 63 01/16/2021 0933   LDLCALC 52 06/09/2020 0802    Assessment & Plan:   1) DM type 2 causing vascular disease ([place])  - Cassandra Adams has currently uncontrolled symptomatic type 2 DM since  75 years of age.  She presents today, accompanied by a family member, with her logs showing drastically fluctuating glycemic profile with random, moderate hypoglycemia.  Her POCT A1c today is 8.4%, worsening from last visit slightly at 8.2%.  She has had less hypoglycemia since we cut her insulin back.    -her diabetes is complicated by coronary artery disease, carotid  atherosclerosis, renal insufficiency, retinopathy, peripheral neuropathy and Cassandra Adams remains at a high risk for more acute and chronic complications which include CAD, CVA, CKD, retinopathy, and neuropathy. These are all discussed in detail with the patient.  - Nutritional counseling repeated at each appointment due to patients tendency to fall back in to old habits.  - The patient admits there is a room for improvement in their diet and drink choices. -  Suggestion is made for the patient to avoid simple carbohydrates from their diet including Cakes, Sweet Desserts / Pastries, Ice Cream, Soda (diet and regular), Sweet Tea, Candies, Chips, Cookies, Sweet Pastries, Store Bought Juices, Alcohol in Excess of 1-2 drinks a day, Artificial Sweeteners, Coffee Creamer, and "Sugar-free" Products. This will help patient to have stable blood glucose profile and potentially avoid unintended weight gain.   - I encouraged the patient to switch to unprocessed or minimally processed complex starch and increased protein intake (animal or plant source), fruits, and vegetables.   - Patient is advised to stick to a routine mealtimes to eat 3 meals a day and avoid unnecessary snacks (to snack only to correct hypoglycemia).  - she has  seen Jearld Fenton, RDN, CDE for individualized diabetes education.  - I have approached her with the following individualized plan to manage diabetes and patient agrees:   -Based on her glycemic response, she will continue to need intensive treatment with readjusted basal/bolus insulin in order for her to achieve and maintain control of diabetes to target.   -  Priority #1 in the care of her diabetes will be to avoid hypoglycemia.  She is on blood thinners and has a history of passing out from low glucose.  -Based on her stable glycemic profile and less episodes of hypoglycemia, she is advised to continue her current dose of Basaglar at 32 units SQ nightly and Novolog 6-9 units TID with meals if glucose is above 90 and she is eating (Specific instructions on how to titrate insulin dosage based on glucose readings given to patient in writing).  She can continue her Glipizide 2.5 mg XL daily with breakfast for now (may consider stopping at next visit if hypoglycemia continues).    -She is instructed to continue monitoring blood glucose 4 times a day, before meals and at bedtime and report to the clinic if blood glucose is less than 70 or above 200 for 3 tests in a row.  - Patient is warned not to take insulin without proper monitoring per orders.  -Patient is not a candidate for metformin,SGLT2 inhibitors due to CKD.  - she will be considered for incretin therapy as appropriate next visit. - Patient specific target  A1c;  LDL, HDL, Triglycerides, and  were discussed in detail.  2) BP/HTN: Her blood pressure is controlled to target for her age.  She is advised to continue Coreg 6.25 mg p.o. twice daily, Lasix 40 mg p.o. daily, Hydralazine 25 mg p.o. 3 times daily, and Lisinopril 10 mg p.o. daily.  3) Lipids/HPL:  Her most recent lipid panel from 01/16/21 shows controlled LDL of 63.  She is advised to continue Lipitor 80 mg po daily at bedtime.  Side effects and precautions discussed with  her.  4)  Weight/Diet:  Her Body mass index is 28.41 kg/m.  She is a candidate for modest weight loss.  CDE Consult will be initiated , exercise, and detailed carbohydrates information provided.  5) Chronic Care/Health Maintenance: -she is on ACEI/ARB and Statin medications and  is encouraged to continue to follow up with Ophthalmology, Dentist,  Podiatrist at least yearly or according to recommendations, and advised to  stay away from smoking. I have recommended yearly flu vaccine and pneumonia vaccination at least every 5 years; moderate intensity exercise for up to 150 minutes weekly; and  sleep for at least 7 hours a day.  - I advised patient to maintain close follow up with Sharilyn Sites, MD for primary care needs.        I spent 44 minutes in the care of the patient today including review of labs from [facility], Lipids, Thyroid Function, Hematology (current and previous including abstractions from other facilities); face-to-face time discussing  her blood glucose readings/logs, discussing hypoglycemia and hyperglycemia episodes and symptoms, medications doses, her options of short and long term treatment based on the latest standards of care / guidelines;  discussion about incorporating lifestyle medicine;  and documenting the encounter.    Please refer to Patient Instructions for Blood Glucose Monitoring and Insulin/Medications Dosing Guide"  in media tab for additional information. Please  also refer to " Patient Self Inventory" in the Media  tab for reviewed elements of pertinent patient history.  Cassandra Adams participated in the discussions, expressed understanding, and voiced agreement with the above plans.  All questions were answered to her satisfaction. she is encouraged to contact clinic should she have any questions or concerns prior to her return visit.    Follow up plan: - Return in about 4 months (around 06/18/2021) for Diabetes F/U-  A1c and UM in office, No previsit  labs, Bring meter and logs.    Cassandra Adams, [facility] [address] [place], [place] [address] Phone: [phone] Fax: [phone]   02/16/2021, 3:45 PM

## 2021-02-16 NOTE — Patient Instructions (Signed)
Diabetes Mellitus and Nutrition, Adult When you have diabetes, or diabetes mellitus, it is very important to have healthy eating habits because your blood sugar (glucose) levels are greatly affected by what you eat and drink. Eating healthy foods in the right amounts, at about the same times every day, can help you:  Control your blood glucose.  Lower your risk of heart disease.  Improve your blood pressure.  Reach or maintain a healthy weight. What can affect my meal plan? Every person with diabetes is different, and each person has different needs for a meal plan. Your health care provider may recommend that you work with a dietitian to make a meal plan that is best for you. Your meal plan may vary depending on factors such as:  The calories you need.  The medicines you take.  Your weight.  Your blood glucose, blood pressure, and cholesterol levels.  Your activity level.  Other health conditions you have, such as heart or kidney disease. How do carbohydrates affect me? Carbohydrates, also called carbs, affect your blood glucose level more than any other type of food. Eating carbs naturally raises the amount of glucose in your blood. Carb counting is a method for keeping track of how many carbs you eat. Counting carbs is important to keep your blood glucose at a healthy level, especially if you use insulin or take certain oral diabetes medicines. It is important to know how many carbs you can safely have in each meal. This is different for every person. Your dietitian can help you calculate how many carbs you should have at each meal and for each snack. How does alcohol affect me? Alcohol can cause a sudden decrease in blood glucose (hypoglycemia), especially if you use insulin or take certain oral diabetes medicines. Hypoglycemia can be a life-threatening condition. Symptoms of hypoglycemia, such as sleepiness, dizziness, and confusion, are similar to symptoms of having too much  alcohol.  Do not drink alcohol if: ? Your health care provider tells you not to drink. ? You are pregnant, may be pregnant, or are planning to become pregnant.  If you drink alcohol: ? Do not drink on an empty stomach. ? Limit how much you use to:  0-1 drink a day for women.  0-2 drinks a day for men. ? Be aware of how much alcohol is in your drink. In the [place] one drink equals one 12 oz bottle of beer (355 mL), one 5 oz glass of wine (148 mL), or one 1 oz glass of hard liquor (44 mL). ? Keep yourself hydrated with water, diet soda, or unsweetened iced tea.  Keep in mind that regular soda, juice, and other mixers may contain a lot of sugar and must be counted as carbs. What are tips for following this plan? Reading food labels  Start by checking the serving size on the "Nutrition Facts" label of packaged foods and drinks. The amount of calories, carbs, fats, and other nutrients listed on the label is based on one serving of the item. Many items contain more than one serving per package.  Check the total grams (g) of carbs in one serving. You can calculate the number of servings of carbs in one serving by dividing the total carbs by 15. For example, if a food has 30 g of total carbs per serving, it would be equal to 2 servings of carbs.  Check the number of grams (g) of saturated fats and trans fats in one serving. Choose foods that have   a low amount or none of these fats.  Check the number of milligrams (mg) of salt (sodium) in one serving. Most people should limit total sodium intake to less than 2,300 mg per day.  Always check the nutrition information of foods labeled as "low-fat" or "nonfat." These foods may be higher in added sugar or refined carbs and should be avoided.  Talk to your dietitian to identify your daily goals for nutrients listed on the label. Shopping  Avoid buying canned, pre-made, or processed foods. These foods tend to be high in fat, sodium, and added  sugar.  Shop around the outside edge of the grocery store. This is where you will most often find fresh fruits and vegetables, bulk grains, fresh meats, and fresh dairy. Cooking  Use low-heat cooking methods, such as baking, instead of high-heat cooking methods like deep frying.  Cook using healthy oils, such as olive, canola, or sunflower oil.  Avoid cooking with butter, cream, or high-fat meats. Meal planning  Eat meals and snacks regularly, preferably at the same times every day. Avoid going long periods of time without eating.  Eat foods that are high in fiber, such as fresh fruits, vegetables, beans, and whole grains. Talk with your dietitian about how many servings of carbs you can eat at each meal.  Eat 4-6 oz (112-168 g) of lean protein each day, such as lean meat, chicken, fish, eggs, or tofu. One ounce (oz) of lean protein is equal to: ? 1 oz (28 g) of meat, chicken, or fish. ? 1 egg. ?  cup (62 g) of tofu.  Eat some foods each day that contain healthy fats, such as avocado, nuts, seeds, and fish.   What foods should I eat? Fruits Berries. Apples. Oranges. Peaches. Apricots. Plums. Grapes. Mango. Papaya. Pomegranate. Kiwi. Cherries. Vegetables Lettuce. Spinach. Leafy greens, including kale, chard, collard greens, and mustard greens. Beets. Cauliflower. Cabbage. Broccoli. Carrots. Green beans. Tomatoes. Peppers. Onions. Cucumbers. Brussels sprouts. Grains Whole grains, such as whole-wheat or whole-grain bread, crackers, tortillas, cereal, and pasta. Unsweetened oatmeal. Quinoa. Brown or wild rice. Meats and other proteins Seafood. Poultry without skin. Lean cuts of poultry and beef. Tofu. Nuts. Seeds. Dairy Low-fat or fat-free dairy products such as milk, yogurt, and cheese. The items listed above may not be a complete list of foods and beverages you can eat. Contact a dietitian for more information. What foods should I avoid? Fruits Fruits canned with  syrup. Vegetables Canned vegetables. Frozen vegetables with butter or cream sauce. Grains Refined white flour and flour products such as bread, pasta, snack foods, and cereals. Avoid all processed foods. Meats and other proteins Fatty cuts of meat. Poultry with skin. Breaded or fried meats. Processed meat. Avoid saturated fats. Dairy Full-fat yogurt, cheese, or milk. Beverages Sweetened drinks, such as soda or iced tea. The items listed above may not be a complete list of foods and beverages you should avoid. Contact a dietitian for more information. Questions to ask a health care provider  Do I need to meet with a diabetes educator?  Do I need to meet with a dietitian?  What number can I call if I have questions?  When are the best times to check my blood glucose? Where to find more information:  American Diabetes Association: diabetes.org  Academy of Nutrition and Dietetics: www.eatright.org  National Institute of Diabetes and Digestive and Kidney Diseases: www.niddk.nih.gov  Association of Diabetes Care and Education Specialists: www.diabeteseducator.org Summary  It is important to have healthy eating   habits because your blood sugar (glucose) levels are greatly affected by what you eat and drink.  A healthy meal plan will help you control your blood glucose and maintain a healthy lifestyle.  Your health care provider may recommend that you work with a dietitian to make a meal plan that is best for you.  Keep in mind that carbohydrates (carbs) and alcohol have immediate effects on your blood glucose levels. It is important to count carbs and to use alcohol carefully. This information is not intended to replace advice given to you by your health care provider. Make sure you discuss any questions you have with your health care provider. Document Revised: 05/20/2019 Document Reviewed: 05/20/2019 Elsevier Patient Education  2021 [employer].  

## 2021-02-17 ENCOUNTER — Telehealth: Payer: Self-pay | Admitting: Internal Medicine

## 2021-02-17 NOTE — Telephone Encounter (Signed)
error 

## 2021-03-10 ENCOUNTER — Telehealth: Payer: Self-pay

## 2021-03-10 NOTE — Telephone Encounter (Signed)
Pt's daughter called and said her sugar has been high. She did not have her readings. I told her I wold call J[facility], left a VM for her to call back.

## 2021-03-11 NOTE — Telephone Encounter (Signed)
Her blood sugars are all over the place.  However since yesterday evening, things seem to be improving.  Has anything recently happened out of the ordinary?  Any steroid use recently?  Any infections recently?  I am hesitant to change her insulin doses just yet since her glucose was at goal this morning and yesterday evening.  I do not want to increase her insulin and cause her to bottom out (that is much more dangerous).

## 2021-03-11 NOTE — Telephone Encounter (Signed)
Readings are as followed:  9/12- 600, 300, 126, 431 9/13- 367, 243, 264, 324 9/14- 343, 398, 448, 213 9/15-351, 240, 113, 170 9/16- 133

## 2021-03-11 NOTE — Telephone Encounter (Signed)
Called patient and left a detailed voice message with the message from [place] and to call back to let us know if she has had any of those changes so we can advise on how to proceed.

## 2021-03-15 ENCOUNTER — Other Ambulatory Visit: Payer: Self-pay

## 2021-03-15 ENCOUNTER — Encounter: Payer: Self-pay | Admitting: Physician Assistant

## 2021-03-15 ENCOUNTER — Ambulatory Visit (INDEPENDENT_AMBULATORY_CARE_PROVIDER_SITE_OTHER): Payer: Medicare HMO | Admitting: Physician Assistant

## 2021-03-15 VITALS — BP 160/50 | HR 60 | Ht 66.0 in | Wt 165.6 lb

## 2021-03-15 DIAGNOSIS — I5032 Chronic diastolic (congestive) heart failure: Secondary | ICD-10-CM | POA: Diagnosis not present

## 2021-03-15 DIAGNOSIS — N183 Chronic kidney disease, stage 3 unspecified: Secondary | ICD-10-CM

## 2021-03-15 DIAGNOSIS — I4819 Other persistent atrial fibrillation: Secondary | ICD-10-CM | POA: Diagnosis not present

## 2021-03-15 DIAGNOSIS — E785 Hyperlipidemia, unspecified: Secondary | ICD-10-CM

## 2021-03-15 DIAGNOSIS — E119 Type 2 diabetes mellitus without complications: Secondary | ICD-10-CM | POA: Diagnosis not present

## 2021-03-15 DIAGNOSIS — I10 Essential (primary) hypertension: Secondary | ICD-10-CM

## 2021-03-15 DIAGNOSIS — I2510 Atherosclerotic heart disease of native coronary artery without angina pectoris: Secondary | ICD-10-CM

## 2021-03-15 MED ORDER — HYDRALAZINE HCL 50 MG PO TABS: 50.0000 mg | ORAL_TABLET | Freq: Three times a day (TID) | ORAL | 1 refills | Status: DC

## 2021-03-15 NOTE — Patient Instructions (Signed)
Medication Instructions:  INCREASE Hydralazine to 50 mg 3 times a day   *If you need a refill on your cardiac medications before your next appointment, please call your pharmacy*  Lab Work: NONE ordered at this time of appointment   If you have labs (blood work) drawn today and your tests are completely normal, you will receive your results only by: [place] (if you have MyChart) OR A paper copy in the mail If you have any lab test that is abnormal or we need to change your treatment, we will call you to review the results.  Testing/Procedures: NONE ordered at this time of appointment   Follow-Up: At [facility], you and your health needs are our priority.  As part of our continuing mission to provide you with exceptional heart care, we have created designated Provider Care Teams.  These Care Teams include your primary Cardiologist (physician) and Advanced Practice Providers (APPs -  Physician Assistants and Nurse Practitioners) who all work together to provide you with the care you need, when you need it.  We recommend signing up for the patient portal called "MyChart".  Sign up information is provided on this After Visit Summary.  MyChart is used to connect with patients for Virtual Visits (Telemedicine).  Patients are able to view lab/test results, encounter notes, upcoming appointments, etc.  Non-urgent messages can be sent to your provider as well.   To learn more about what you can do with MyChart, go to [email or web address].    Your next appointment:   As scheduled  06/30/21 @ 8:30 AM  The format for your next appointment:   In Person  Provider:   Raliegh Ip Mali Hilty, MD  Other Instructions

## 2021-03-15 NOTE — Progress Notes (Signed)
Cardiology Office Note:    Date:  03/16/2021   ID:  Cassandra Adams, Cassandra Adams 1946/02/24, MRN [id number]  PCP:  Sharilyn Sites, MD   [facility] Providers Cardiologist:  [name], MD     Referring MD: Sharilyn Sites, MD   Chief Complaint  Patient presents with   Follow-up    Seen for Dr. Debara Pickett    History of Present Illness:    Cassandra Adams is a 75 y.o. female with a hx of HTN, HLD, DM2, CAD, PAF, CKD stage III and history of TIA.  Cardiac catheterization performed in July 2015 demonstrated occlusion of mid RCA, moderate LAD lesion was 50% stenosis in the proximal area followed by a 60 to 70% stenosis, normal left main and left circumflex artery, EF 60%.  Given lack of ongoing symptoms and preserved LV function, it was recommended to treat RCA occlusion medically. Carotid Doppler revealed mild 1 to 39% stenosis in the left carotid artery, no evidence of stenosis in the right carotid artery.  1 year repeat study was recommended. She was admitted in early May 2022 after having a fall at home from her recliner.  She suffered nasal contusion and passing out spell.  Maxillofacial CT did not show any fractures however did show a small nasal soft tissue laceration with small air-fluid level in the left maxillary and right sphenoid sinuses.  CT of cervical spine showed progressive severe degenerative disease at C6-7 however no acute fracture.  CT of the head showed no intracranial etiology.  Nosebleed was stopped in the emergency room with nasal packing.  EKG in the ED demonstrated recurrence of atrial fibrillation with RVR.  Eliquis temporarily held however resumed the day after discharge.  There is a questionable occurrence of syncope during the fall, however this was felt to be vasovagal.  Echocardiogram obtained during the hospitalization showed EF 55 to 60%, moderate asymmetric LVH of basal septal segment, RVSP 27 mmHg, possible small to moderate pericardial effusion with no  hemodynamic significance, mild MR. She was readmitted a few days after discharge in May 2022 after developing significant vomiting with coffee-ground emesis.  GI service was consulted during the second admission and patient underwent EGD on 5/16 that showed erosive gastropathy with no stigmata of bleeding.  It was recommended to start Protonix 40 mg twice a day for 12 weeks and also resume Eliquis.  I last saw the patient on 01/05/2021, she was volume overloaded on physical exam.  I increased her carvedilol to 6.25 mg twice a day and also increased her Lasix to 40 mg twice a day.  I recommend proceed with cardioversion as outpatient.  Patient underwent successful DC cardioversion by Dr. Debara Pickett on 01/14/2021, carvedilol was subsequently reduced to 6.25 mg twice a day after the procedure.  Unfortunately she returned back to the hospital 2 days later on 7/24 with worsening dyspnea.  Chest x-ray showed bilateral airspace disease which could be edema versus pneumonia.  EKG showed normal sinus rhythm.  She underwent diuresis for acute on chronic diastolic heart failure.  She was instructed to call cardiology service if her weight exceed 182 pounds or if she gains 3 pounds overnight or 5 pounds in single week.  Dr. Sallyanne Kuster also talked to the patient in the hospital regarding purchase a [facility] device.  Since discharge, she has been seen by the A. fib clinic on 01/25/2021 at which time she was doing well.  Patient presents today for follow-up.  She denies any chest pain  or worsening dyspnea.  She appears to be euvolemic on physical exam.  She does complain of left neck pain that is worse with turning the head.  Last carotid Doppler obtained in February showed 1 to 39% left carotid artery disease, essentially clean right carotid artery.  Her symptom is more consistent with degenerative disease in the cervical spine.  If symptom worsens, she may need to see a neurosurgeon.  Otherwise, she is maintaining sinus rhythm  based on today's EKG.  Heart rate is very well controlled.  Blood pressure is elevated.  Given repeat manual recheck by myself, blood pressure was still 146/58 on both side.  Even though her blood pressure is normal during the A. fib clinic visit last month, however according to the patient, her blood pressure has been elevated at home.  I decided to increase hydralazine to 50 mg 3 times daily dosing.  Otherwise, patient can follow-up with Dr. Debara Pickett in January as previously scheduled.  Lab work obtained via August shows improved renal function and a stable electrolyte.   Past Medical History:  Diagnosis Date   Arthritis    "all over"   CAD (coronary artery disease)    a. NSTEMI 12/2013 - occluded dominant RCA with L-R collaterals, moderate prox segmental LAD disease, for medical therapy initially, EF 60% with subtle inferobasal hypokinesia. Consider PCI for refractory CP.   CKD (chronic kidney disease), stage III ([place])    Hypertension    Migraines    "weekly" (01/06/2014)   Sickle cell trait ([place])    TIA (transient ischemic attack) 1978   Type II diabetes mellitus ([place])    Vertigo     Past Surgical History:  Procedure Laterality Date   APPENDECTOMY  March 2014   had appendix frozen   CARDIAC CATHETERIZATION  "years ago" & 01/05/2014   CARDIOVERSION N/A 03/17/2014   Procedure: CARDIOVERSION;  Surgeon: [name], MD;  Location: [place];  Service: Cardiovascular;  Laterality: N/A;   CARDIOVERSION N/A 01/14/2021   Procedure: CARDIOVERSION;  Surgeon: [name], MD;  Location: [place];  Service: Cardiovascular;  Laterality: N/A;   CATARACT EXTRACTION W/ INTRAOCULAR LENS  IMPLANT, BILATERAL Bilateral 04/2013-05/2013   ENDARTERECTOMY Right 02/05/2017   Procedure: ENDARTERECTOMY CAROTID-RIGHT;  Surgeon: [name], MD;  Location: [place];  Service: Vascular;  Laterality: Right;   ESOPHAGOGASTRODUODENOSCOPY  11/08/2007    Normal esophagus without evidence of Barrett,  mass, erosion/ Normal stomach, duodenal bulb   ESOPHAGOGASTRODUODENOSCOPY (EGD) WITH PROPOFOL N/A 11/08/2020   Procedure: ESOPHAGOGASTRODUODENOSCOPY (EGD) WITH PROPOFOL;  Surgeon: Thornton Park, MD;  Location: [place];  Service: Gastroenterology;  Laterality: N/A;   GASTRIC MOTILITY STUDY  11/13/2007   mildly delayed emptying subjectively, but normal  analysis-77% of tracer emptied at 2 hours   JOINT REPLACEMENT     LAPAROSCOPIC APPENDECTOMY N/A 09/15/2012   Procedure: APPENDECTOMY LAPAROSCOPIC;  Surgeon: [name], MD;  Location: [facility];  Service: General;  Laterality: N/A;   LEFT HEART CATHETERIZATION WITH CORONARY ANGIOGRAM N/A 01/05/2014   Procedure: LEFT HEART CATHETERIZATION WITH CORONARY ANGIOGRAM;  Surgeon: [name], MD;  Location: [facility] CATH LAB;  Service: Cardiovascular;  Laterality: N/A;   REPLACEMENT TOTAL KNEE Right 05-03-06   TEE WITHOUT CARDIOVERSION N/A 03/17/2014   Procedure: TRANSESOPHAGEAL ECHOCARDIOGRAM (TEE);  Surgeon: [name], MD;  Location[facility] ENDOSCOPY;  Service: Cardiovascular;  Laterality: N/A;   TONSILLECTOMY  1972    Current Medications: Current Meds  Medication Sig   aspirin EC 81 MG  tablet Take 1 tablet (81 mg total) by mouth daily.   atorvastatin (LIPITOR) 80 MG tablet Take 1 tablet (80 mg total) by mouth every evening.   BD INSULIN SYRINGE U/F 31G X 5/16" 0.5 ML MISC Use to take with Novolog TIDAC   carvedilol (COREG) 12.5 MG tablet Take 0.5 tablets (6.25 mg total) by mouth 2 (two) times daily with a meal.   colesevelam (WELCHOL) 625 MG tablet Take 1,875 mg by mouth 2 (two) times daily with a meal. Noon and night   ELIQUIS 5 MG TABS tablet Take 1 tablet (5 mg total) by mouth 2 (two) times daily.   furosemide (LASIX) 40 MG tablet Take 40 mg  one tablet by mouth daily , may take an additional 40 mg if needed daily if leg edema worsens   glipiZIDE (GLUCOTROL XL) 2.5 MG 24 hr tablet Take 2.5 mg by mouth daily with breakfast.   Insulin  Glargine (BASAGLAR KWIKPEN) 100 UNIT/ML Inject 32 Units into the skin at bedtime.   lisinopril (ZESTRIL) 20 MG tablet Take 1 tablet (20 mg total) by mouth daily.   nitroGLYCERIN (NITROSTAT) 0.4 MG SL tablet Place 1 tablet (0.4 mg total) under the tongue every 5 (five) minutes as needed for chest pain (up to 3 doses).   NOVOLOG 100 UNIT/ML injection Inject 6-9 Units into the skin 3 (three) times daily with meals. Sliding Scale   polyethylene glycol (MIRALAX / GLYCOLAX) 17 g packet Take 17 g by mouth daily.   senna-docusate (SENOKOT-S) 8.6-50 MG tablet Take 1 tablet by mouth at bedtime as needed for mild constipation.   Vitamin D, Cholecalciferol, 25 MCG (1000 UT) TABS Take 2,000 Units by mouth every other day.   [DISCONTINUED] hydrALAZINE (APRESOLINE) 25 MG tablet Take 25 mg by mouth 3 (three) times daily.     Allergies:   Penicillins   Social History   Socioeconomic History   Marital status: Single    Spouse name: Not on file   Number of children: Not on file   Years of education: Not on file   Highest education level: Not on file  Occupational History   Not on file  Tobacco Use   Smoking status: Never   Smokeless tobacco: Never  Vaping Use   Vaping Use: Never used  Substance and Sexual Activity   Alcohol use: No   Drug use: No   Sexual activity: Not Currently    Birth control/protection: Post-menopausal  Other Topics Concern   Not on file  Social History Narrative   Not on file   Social Determinants of Health   Financial Resource Strain: Not on file  Food Insecurity: Not on file  Transportation Needs: Not on file  Physical Activity: Not on file  Stress: Not on file  Social Connections: Not on file     Family History: The patient's family history includes Cancer in her father; Diabetes in her brother; Heart attack in her brother and mother; Heart disease in her brother; Hyperlipidemia in her brother and mother; Hypertension in her brother, mother, and sister; Other in  her brother.  ROS:   Please see the history of present illness.     All other systems reviewed and are negative.  EKGs/Labs/Other Studies Reviewed:    The following studies were reviewed today:  Limited echo 01/17/2021  1. "Asymmetrical hypertrophy" appears to be due to scar and thinning of  the basal posterior wall and backgroung concentric hypertrophy, rather  than primary cardiomyopathy. Apical and mid wall segments  are  hyperdynamic. Left ventricular ejection  fraction, by estimation, is 60 to 65%. Left ventricular ejection fraction  by 3D volume is 54 %. The left ventricle has normal function. The left  ventricle demonstrates regional wall motion abnormalities (see scoring  diagram/findings for description).  There is mild asymmetric left ventricular hypertrophy of the septal  segment. Left ventricular diastolic function could not be evaluated.   2. Right ventricular systolic function is normal. The right ventricular  size is normal. There is normal pulmonary artery systolic pressure. The  estimated right ventricular systolic pressure is 97.5 mmHg.   3. Left atrial size was mildly dilated.   4. Mild to moderate mitral valve regurgitation.   5. The aortic valve is tricuspid. There is mild calcification of the  aortic valve. Mild aortic valve sclerosis is present, with no evidence of  aortic valve stenosis.   EKG:  EKG is ordered today.  The ekg ordered today demonstrates NSR with TWI in the inferolateral leads  Recent Labs: 01/16/2021: B Natriuretic Peptide 631.7; Magnesium 2.0; TSH 0.363 01/25/2021: Hemoglobin 10.6; Platelets 169 02/10/2021: ALT 16; BUN 32; Creatinine, Ser 1.34; Potassium 4.8; Sodium 137  Recent Lipid Panel    Component Value Date/Time   CHOL 144 01/16/2021 0933   CHOL 128 06/09/2020 0802   TRIG 61 01/16/2021 0933   HDL 69 01/16/2021 0933   HDL 61 06/09/2020 0802   CHOLHDL 2.1 01/16/2021 0933   VLDL 12 01/16/2021 0933   LDLCALC 63 01/16/2021 0933    LDLCALC 52 06/09/2020 0802     Risk Assessment/Calculations:    CHA2DS2-VASc Score = 9   This indicates a 12.2% annual risk of stroke. The patient's score is based upon: CHF History: 1 HTN History: 1 Diabetes History: 1 Stroke History: 2 Vascular Disease History: 1 Age Score: 2 Gender Score: 1          Physical Exam:    VS:  BP (!) 160/50 (BP Location: Right Arm)   Pulse 60   Ht 5\' 6"  (1.676 m)   Wt 165 lb 9.6 oz (75.1 kg)   SpO2 99%   BMI 26.73 kg/m     Wt Readings from Last 3 Encounters:  03/15/21 165 lb 9.6 oz (75.1 kg)  02/16/21 176 lb (79.8 kg)  01/25/21 178 lb 3.2 oz (80.8 kg)     GEN:  Well nourished, well developed in no acute distress HEENT: Normal NECK: No JVD; No carotid bruits LYMPHATICS: No lymphadenopathy CARDIAC: RRR, no murmurs, rubs, gallops RESPIRATORY:  Clear to auscultation without rales, wheezing or rhonchi  ABDOMEN: Soft, non-tender, non-distended MUSCULOSKELETAL:  No edema; No deformity  SKIN: Warm and dry NEUROLOGIC:  Alert and oriented x 3 PSYCHIATRIC:  Normal affect   ASSESSMENT:    1. Persistent atrial fibrillation ([place])   2. Coronary artery disease involving native coronary artery of native heart without angina pectoris   3. Essential hypertension   4. Hyperlipidemia LDL goal <70   5. Controlled type 2 diabetes mellitus without complication, without long-term current use of insulin (HCC)   6. Stage 3 chronic kidney disease, unspecified whether stage 3a or 3b CKD ([place])   7. Chronic diastolic heart failure (HCC)    PLAN:    In order of problems listed above:  Persistent atrial fibrillation: recently underwent cardioversion. Maintaining normal sinus rhythm  CAD: denies any chest pain. Continue aspirin and lipitor  HTN: blood pressure elevated today, increase hydralazine to 50mg  TID  HLD: continue lipitor  DM II: managed  by PCP  CKD stage III: stable on last lab work  Chronic diastolic CHF: recent acute CHF  exacerbation after cardioversion, currently euvolemic on exam        Medication Adjustments/Labs and Tests Ordered: Current medicines are reviewed at length with the patient today.  Concerns regarding medicines are outlined above.  Orders Placed This Encounter  Procedures   EKG 12-Lead   Meds ordered this encounter  Medications   hydrALAZINE (APRESOLINE) 50 MG tablet    Sig: Take 1 tablet (50 mg total) by mouth 3 (three) times daily.    Dispense:  270 tablet    Refill:  1    Patient Instructions  Medication Instructions:  INCREASE Hydralazine to 50 mg 3 times a day   *If you need a refill on your cardiac medications before your next appointment, please call your pharmacy*  Lab Work: NONE ordered at this time of appointment   If you have labs (blood work) drawn today and your tests are completely normal, you will receive your results only by: [place] (if you have MyChart) OR A paper copy in the mail If you have any lab test that is abnormal or we need to change your treatment, we will call you to review the results.  Testing/Procedures: NONE ordered at this time of appointment   Follow-Up: At [facility], you and your health needs are our priority.  As part of our continuing mission to provide you with exceptional heart care, we have created designated Provider Care Teams.  These Care Teams include your primary Cardiologist (physician) and Advanced Practice Providers (APPs -  Physician Assistants and Nurse Practitioners) who all work together to provide you with the care you need, when you need it.  We recommend signing up for the patient portal called "MyChart".  Sign up information is provided on this After Visit Summary.  MyChart is used to connect with patients for Virtual Visits (Telemedicine).  Patients are able to view lab/test results, encounter notes, upcoming appointments, etc.  Non-urgent messages can be sent to your provider as well.   To learn more  about what you can do with MyChart, go to [email or web address].    Your next appointment:   As scheduled  06/30/21 @ 8:30 AM  The format for your next appointment:   In Person  Provider:   Raliegh Ip Mali Hilty, MD  Other Instructions    Signed, Almyra Deforest, [place]  03/16/2021 11:22 PM    [facility]

## 2021-03-16 ENCOUNTER — Encounter: Payer: Self-pay | Admitting: Physician Assistant

## 2021-03-25 ENCOUNTER — Other Ambulatory Visit: Payer: Self-pay | Admitting: Internal Medicine

## 2021-04-19 ENCOUNTER — Other Ambulatory Visit: Payer: Self-pay | Admitting: Nurse Practitioner

## 2021-04-22 DIAGNOSIS — Z13220 Encounter for screening for lipoid disorders: Secondary | ICD-10-CM | POA: Diagnosis not present

## 2021-04-22 DIAGNOSIS — I10 Essential (primary) hypertension: Secondary | ICD-10-CM | POA: Diagnosis not present

## 2021-04-22 DIAGNOSIS — E210 Primary hyperparathyroidism: Secondary | ICD-10-CM | POA: Diagnosis not present

## 2021-04-22 DIAGNOSIS — E1129 Type 2 diabetes mellitus with other diabetic kidney complication: Secondary | ICD-10-CM | POA: Diagnosis not present

## 2021-04-22 DIAGNOSIS — I4891 Unspecified atrial fibrillation: Secondary | ICD-10-CM | POA: Diagnosis not present

## 2021-04-22 DIAGNOSIS — E663 Overweight: Secondary | ICD-10-CM | POA: Diagnosis not present

## 2021-04-22 DIAGNOSIS — Z6828 Body mass index (BMI) 28.0-28.9, adult: Secondary | ICD-10-CM | POA: Diagnosis not present

## 2021-04-22 DIAGNOSIS — Z23 Encounter for immunization: Secondary | ICD-10-CM | POA: Diagnosis not present

## 2021-04-22 DIAGNOSIS — E1140 Type 2 diabetes mellitus with diabetic neuropathy, unspecified: Secondary | ICD-10-CM | POA: Diagnosis not present

## 2021-04-24 ENCOUNTER — Other Ambulatory Visit: Payer: Self-pay | Admitting: Nurse Practitioner

## 2021-05-17 DIAGNOSIS — E1165 Type 2 diabetes mellitus with hyperglycemia: Secondary | ICD-10-CM | POA: Diagnosis not present

## 2021-05-17 DIAGNOSIS — I509 Heart failure, unspecified: Secondary | ICD-10-CM | POA: Diagnosis not present

## 2021-05-17 DIAGNOSIS — I2510 Atherosclerotic heart disease of native coronary artery without angina pectoris: Secondary | ICD-10-CM | POA: Diagnosis not present

## 2021-05-17 DIAGNOSIS — Z794 Long term (current) use of insulin: Secondary | ICD-10-CM | POA: Diagnosis not present

## 2021-05-17 DIAGNOSIS — E785 Hyperlipidemia, unspecified: Secondary | ICD-10-CM | POA: Diagnosis not present

## 2021-05-17 DIAGNOSIS — E1142 Type 2 diabetes mellitus with diabetic polyneuropathy: Secondary | ICD-10-CM | POA: Diagnosis not present

## 2021-05-17 DIAGNOSIS — E261 Secondary hyperaldosteronism: Secondary | ICD-10-CM | POA: Diagnosis not present

## 2021-05-17 DIAGNOSIS — G8929 Other chronic pain: Secondary | ICD-10-CM | POA: Diagnosis not present

## 2021-05-17 DIAGNOSIS — E663 Overweight: Secondary | ICD-10-CM | POA: Diagnosis not present

## 2021-05-17 DIAGNOSIS — D6869 Other thrombophilia: Secondary | ICD-10-CM | POA: Diagnosis not present

## 2021-05-17 DIAGNOSIS — I110 Hypertensive heart disease with heart failure: Secondary | ICD-10-CM | POA: Diagnosis not present

## 2021-05-17 DIAGNOSIS — I4891 Unspecified atrial fibrillation: Secondary | ICD-10-CM | POA: Diagnosis not present

## 2021-06-11 ENCOUNTER — Emergency Department (HOSPITAL_COMMUNITY): Payer: Medicare HMO

## 2021-06-11 ENCOUNTER — Inpatient Hospital Stay (HOSPITAL_COMMUNITY)
Admission: EM | Admit: 2021-06-11 | Discharge: 2021-06-15 | DRG: 638 | Disposition: A | Discharge status: Home / Self Care | Payer: Medicare HMO | Attending: Internal Medicine | Admitting: Internal Medicine

## 2021-06-11 ENCOUNTER — Other Ambulatory Visit: Payer: Self-pay

## 2021-06-11 ENCOUNTER — Encounter (HOSPITAL_COMMUNITY): Payer: Self-pay

## 2021-06-11 DIAGNOSIS — Z8249 Family history of ischemic heart disease and other diseases of the circulatory system: Secondary | ICD-10-CM

## 2021-06-11 DIAGNOSIS — E11649 Type 2 diabetes mellitus with hypoglycemia without coma: Principal | ICD-10-CM | POA: Diagnosis present

## 2021-06-11 DIAGNOSIS — I252 Old myocardial infarction: Secondary | ICD-10-CM

## 2021-06-11 DIAGNOSIS — I6529 Occlusion and stenosis of unspecified carotid artery: Secondary | ICD-10-CM | POA: Diagnosis present

## 2021-06-11 DIAGNOSIS — I5032 Chronic diastolic (congestive) heart failure: Secondary | ICD-10-CM | POA: Diagnosis present

## 2021-06-11 DIAGNOSIS — R55 Syncope and collapse: Secondary | ICD-10-CM | POA: Diagnosis not present

## 2021-06-11 DIAGNOSIS — I130 Hypertensive heart and chronic kidney disease with heart failure and stage 1 through stage 4 chronic kidney disease, or unspecified chronic kidney disease: Secondary | ICD-10-CM | POA: Diagnosis present

## 2021-06-11 DIAGNOSIS — I10 Essential (primary) hypertension: Secondary | ICD-10-CM | POA: Diagnosis not present

## 2021-06-11 DIAGNOSIS — Z7901 Long term (current) use of anticoagulants: Secondary | ICD-10-CM | POA: Diagnosis not present

## 2021-06-11 DIAGNOSIS — Z7982 Long term (current) use of aspirin: Secondary | ICD-10-CM

## 2021-06-11 DIAGNOSIS — Z8673 Personal history of transient ischemic attack (TIA), and cerebral infarction without residual deficits: Secondary | ICD-10-CM

## 2021-06-11 DIAGNOSIS — D573 Sickle-cell trait: Secondary | ICD-10-CM | POA: Diagnosis present

## 2021-06-11 DIAGNOSIS — I4819 Other persistent atrial fibrillation: Secondary | ICD-10-CM | POA: Diagnosis not present

## 2021-06-11 DIAGNOSIS — D649 Anemia, unspecified: Secondary | ICD-10-CM | POA: Diagnosis not present

## 2021-06-11 DIAGNOSIS — A419 Sepsis, unspecified organism: Secondary | ICD-10-CM | POA: Diagnosis present

## 2021-06-11 DIAGNOSIS — K219 Gastro-esophageal reflux disease without esophagitis: Secondary | ICD-10-CM | POA: Diagnosis present

## 2021-06-11 DIAGNOSIS — R0689 Other abnormalities of breathing: Secondary | ICD-10-CM | POA: Diagnosis not present

## 2021-06-11 DIAGNOSIS — I495 Sick sinus syndrome: Secondary | ICD-10-CM | POA: Diagnosis present

## 2021-06-11 DIAGNOSIS — I482 Chronic atrial fibrillation, unspecified: Secondary | ICD-10-CM | POA: Diagnosis not present

## 2021-06-11 DIAGNOSIS — R9431 Abnormal electrocardiogram [ECG] [EKG]: Secondary | ICD-10-CM | POA: Diagnosis not present

## 2021-06-11 DIAGNOSIS — R6520 Severe sepsis without septic shock: Secondary | ICD-10-CM | POA: Diagnosis not present

## 2021-06-11 DIAGNOSIS — E1165 Type 2 diabetes mellitus with hyperglycemia: Secondary | ICD-10-CM | POA: Diagnosis present

## 2021-06-11 DIAGNOSIS — E160 Drug-induced hypoglycemia without coma: Secondary | ICD-10-CM | POA: Diagnosis not present

## 2021-06-11 DIAGNOSIS — N1832 Chronic kidney disease, stage 3b: Secondary | ICD-10-CM | POA: Diagnosis present

## 2021-06-11 DIAGNOSIS — R000 Tachycardia, unspecified: Secondary | ICD-10-CM | POA: Diagnosis not present

## 2021-06-11 DIAGNOSIS — E785 Hyperlipidemia, unspecified: Secondary | ICD-10-CM | POA: Diagnosis present

## 2021-06-11 DIAGNOSIS — Z7984 Long term (current) use of oral hypoglycemic drugs: Secondary | ICD-10-CM

## 2021-06-11 DIAGNOSIS — I2510 Atherosclerotic heart disease of native coronary artery without angina pectoris: Secondary | ICD-10-CM | POA: Diagnosis present

## 2021-06-11 DIAGNOSIS — Z794 Long term (current) use of insulin: Secondary | ICD-10-CM

## 2021-06-11 DIAGNOSIS — Z20822 Contact with and (suspected) exposure to covid-19: Secondary | ICD-10-CM | POA: Diagnosis present

## 2021-06-11 DIAGNOSIS — Z79899 Other long term (current) drug therapy: Secondary | ICD-10-CM

## 2021-06-11 DIAGNOSIS — Z0389 Encounter for observation for other suspected diseases and conditions ruled out: Secondary | ICD-10-CM | POA: Diagnosis not present

## 2021-06-11 DIAGNOSIS — I480 Paroxysmal atrial fibrillation: Secondary | ICD-10-CM | POA: Diagnosis not present

## 2021-06-11 DIAGNOSIS — Z96651 Presence of right artificial knee joint: Secondary | ICD-10-CM | POA: Diagnosis present

## 2021-06-11 DIAGNOSIS — R4020 Unspecified coma: Secondary | ICD-10-CM | POA: Diagnosis not present

## 2021-06-11 DIAGNOSIS — E1140 Type 2 diabetes mellitus with diabetic neuropathy, unspecified: Secondary | ICD-10-CM | POA: Diagnosis present

## 2021-06-11 DIAGNOSIS — Z743 Need for continuous supervision: Secondary | ICD-10-CM | POA: Diagnosis not present

## 2021-06-11 DIAGNOSIS — E1122 Type 2 diabetes mellitus with diabetic chronic kidney disease: Secondary | ICD-10-CM | POA: Diagnosis present

## 2021-06-11 DIAGNOSIS — I6521 Occlusion and stenosis of right carotid artery: Secondary | ICD-10-CM | POA: Diagnosis not present

## 2021-06-11 DIAGNOSIS — N183 Chronic kidney disease, stage 3 unspecified: Secondary | ICD-10-CM | POA: Diagnosis not present

## 2021-06-11 DIAGNOSIS — T383X5A Adverse effect of insulin and oral hypoglycemic [antidiabetic] drugs, initial encounter: Secondary | ICD-10-CM | POA: Diagnosis not present

## 2021-06-11 DIAGNOSIS — I4510 Unspecified right bundle-branch block: Secondary | ICD-10-CM | POA: Diagnosis not present

## 2021-06-11 DIAGNOSIS — Z833 Family history of diabetes mellitus: Secondary | ICD-10-CM

## 2021-06-11 DIAGNOSIS — R001 Bradycardia, unspecified: Secondary | ICD-10-CM | POA: Diagnosis not present

## 2021-06-11 DIAGNOSIS — N1831 Chronic kidney disease, stage 3a: Secondary | ICD-10-CM | POA: Diagnosis present

## 2021-06-11 HISTORY — DX: Unspecified diastolic (congestive) heart failure: I50.30

## 2021-06-11 LAB — PROTIME-INR
INR: 1.2 (ref 0.8–1.2)
Prothrombin Time: 15.2 seconds (ref 11.4–15.2)

## 2021-06-11 LAB — CBC WITH DIFFERENTIAL/PLATELET
Abs Immature Granulocytes: 0.01 10*3/uL (ref 0.00–0.07)
Basophils Absolute: 0 10*3/uL (ref 0.0–0.1)
Basophils Relative: 1 %
Eosinophils Absolute: 0.1 10*3/uL (ref 0.0–0.5)
Eosinophils Relative: 3 %
HCT: 34.6 % — ABNORMAL LOW (ref 36.0–46.0)
Hemoglobin: 12.2 g/dL (ref 12.0–15.0)
Immature Granulocytes: 0 %
Lymphocytes Relative: 22 %
Lymphs Abs: 0.9 10*3/uL (ref 0.7–4.0)
MCH: 28.7 pg (ref 26.0–34.0)
MCHC: 35.3 g/dL (ref 30.0–36.0)
MCV: 81.4 fL (ref 80.0–100.0)
Monocytes Absolute: 0.4 10*3/uL (ref 0.1–1.0)
Monocytes Relative: 9 %
Neutro Abs: 2.9 10*3/uL (ref 1.7–7.7)
Neutrophils Relative %: 65 %
Platelets: 167 10*3/uL (ref 150–400)
RBC: 4.25 MIL/uL (ref 3.87–5.11)
RDW: 15.3 % (ref 11.5–15.5)
WBC: 4.3 10*3/uL (ref 4.0–10.5)
nRBC: 0 % (ref 0.0–0.2)

## 2021-06-11 LAB — URINALYSIS, ROUTINE W REFLEX MICROSCOPIC
Bacteria, UA: NONE SEEN
Bilirubin Urine: NEGATIVE
Glucose, UA: >=500 mg/dL — AB
Ketones, ur: NEGATIVE mg/dL
Leukocytes,Ua: NEGATIVE
Nitrite: NEGATIVE
Protein, ur: 30 mg/dL — AB
Specific Gravity, Urine: 1.002 — ABNORMAL LOW (ref 1.005–1.030)
pH: 6 (ref 5.0–8.0)

## 2021-06-11 LAB — COMPREHENSIVE METABOLIC PANEL
ALT: 24 U/L (ref 0–44)
AST: 39 U/L (ref 15–41)
Albumin: 4.7 g/dL (ref 3.5–5.0)
Alkaline Phosphatase: 75 U/L (ref 38–126)
Anion gap: 8 (ref 5–15)
BUN: 27 mg/dL — ABNORMAL HIGH (ref 8–23)
CO2: 27 mmol/L (ref 22–32)
Calcium: 10.6 mg/dL — ABNORMAL HIGH (ref 8.9–10.3)
Chloride: 99 mmol/L (ref 98–111)
Creatinine, Ser: 1.33 mg/dL — ABNORMAL HIGH (ref 0.44–1.00)
GFR, Estimated: 41 mL/min — ABNORMAL LOW (ref >60)
Glucose, Bld: 207 mg/dL — ABNORMAL HIGH (ref 70–99)
Potassium: 3.9 mmol/L (ref 3.5–5.1)
Sodium: 134 mmol/L — ABNORMAL LOW (ref 135–145)
Total Bilirubin: 0.5 mg/dL (ref 0.3–1.2)
Total Protein: 8.7 g/dL — ABNORMAL HIGH (ref 6.5–8.1)

## 2021-06-11 LAB — BLOOD GAS, VENOUS
Acid-base deficit: 1 mmol/L (ref 0.0–2.0)
Bicarbonate: 22.6 mmol/L (ref 20.0–28.0)
Drawn by: 6051
FIO2: 21
O2 Saturation: 68.5 %
Patient temperature: 36.4
pCO2, Ven: 47.9 mmHg (ref 44.0–60.0)
pH, Ven: 7.324 (ref 7.250–7.430)
pO2, Ven: 41.2 mmHg (ref 32.0–45.0)

## 2021-06-11 LAB — TSH: TSH: 0.5 u[IU]/mL (ref 0.350–4.500)

## 2021-06-11 LAB — RESP PANEL BY RT-PCR (FLU A&B, COVID) ARPGX2
Influenza A by PCR: NEGATIVE
Influenza B by PCR: NEGATIVE
SARS Coronavirus 2 by RT PCR: NEGATIVE

## 2021-06-11 LAB — CBG MONITORING, ED: Glucose-Capillary: 202 mg/dL — ABNORMAL HIGH (ref 70–99)

## 2021-06-11 LAB — LACTIC ACID, PLASMA
Lactic Acid, Venous: 2.9 mmol/L (ref 0.5–1.9)
Lactic Acid, Venous: 1.3 mmol/L (ref 0.5–1.9)

## 2021-06-11 LAB — GLUCOSE, CAPILLARY: Glucose-Capillary: 329 mg/dL — ABNORMAL HIGH (ref 70–99)

## 2021-06-11 LAB — APTT: aPTT: 37 seconds — ABNORMAL HIGH (ref 24–36)

## 2021-06-11 LAB — PROCALCITONIN: Procalcitonin: <0.1 ng/mL

## 2021-06-11 LAB — BRAIN NATRIURETIC PEPTIDE: B Natriuretic Peptide: 275 pg/mL — ABNORMAL HIGH (ref 0.0–100.0)

## 2021-06-11 LAB — POC OCCULT BLOOD, ED: Fecal Occult Bld: NEGATIVE

## 2021-06-11 MED ORDER — ACETAMINOPHEN 650 MG RE SUPP: 650.0000 mg | Freq: Four times a day (QID) | RECTAL | Status: DC | PRN

## 2021-06-11 MED ORDER — INSULIN GLARGINE-YFGN 100 UNIT/ML ~~LOC~~ SOLN
40.0000 [IU] | Freq: Every day | SUBCUTANEOUS | Status: DC
  Administered 2021-06-11: 40 [IU] via SUBCUTANEOUS
  Filled 2021-06-11 (×2): qty 0.4

## 2021-06-11 MED ORDER — LACTATED RINGERS IV SOLN: INTRAVENOUS | Status: DC

## 2021-06-11 MED ORDER — ONDANSETRON HCL 4 MG/2ML IJ SOLN: 4.0000 mg | Freq: Four times a day (QID) | INTRAMUSCULAR | Status: DC | PRN

## 2021-06-11 MED ORDER — SODIUM CHLORIDE 0.9 % IV SOLN
2.0000 g | Freq: Two times a day (BID) | INTRAVENOUS | Status: DC
  Administered 2021-06-12 (×2): 2 g via INTRAVENOUS
  Filled 2021-06-11 (×2): qty 2

## 2021-06-11 MED ORDER — INSULIN ASPART 100 UNIT/ML IJ SOLN
4.0000 [IU] | Freq: Three times a day (TID) | INTRAMUSCULAR | Status: DC
  Administered 2021-06-12: 13:00:00 4 [IU] via SUBCUTANEOUS

## 2021-06-11 MED ORDER — ACETAMINOPHEN 325 MG PO TABS
650.0000 mg | ORAL_TABLET | Freq: Four times a day (QID) | ORAL | Status: DC | PRN
  Administered 2021-06-11 – 2021-06-15 (×6): 650 mg via ORAL
  Filled 2021-06-11 (×6): qty 2

## 2021-06-11 MED ORDER — PANTOPRAZOLE SODIUM 40 MG PO TBEC
40.0000 mg | DELAYED_RELEASE_TABLET | Freq: Two times a day (BID) | ORAL | Status: DC
  Administered 2021-06-11 – 2021-06-15 (×8): 40 mg via ORAL
  Filled 2021-06-11 (×8): qty 1

## 2021-06-11 MED ORDER — FUROSEMIDE 40 MG PO TABS
40.0000 mg | ORAL_TABLET | Freq: Every day | ORAL | Status: DC
  Administered 2021-06-12: 10:00:00 40 mg via ORAL
  Filled 2021-06-11: qty 1

## 2021-06-11 MED ORDER — CARVEDILOL 3.125 MG PO TABS
6.2500 mg | ORAL_TABLET | Freq: Two times a day (BID) | ORAL | Status: DC
  Administered 2021-06-11: 20:00:00 6.25 mg via ORAL
  Filled 2021-06-11 (×5): qty 2

## 2021-06-11 MED ORDER — ATORVASTATIN CALCIUM 40 MG PO TABS
80.0000 mg | ORAL_TABLET | Freq: Every evening | ORAL | Status: DC
  Administered 2021-06-12 – 2021-06-15 (×4): 80 mg via ORAL
  Filled 2021-06-11 (×4): qty 2

## 2021-06-11 MED ORDER — INSULIN ASPART 100 UNIT/ML IJ SOLN
0.0000 [IU] | Freq: Three times a day (TID) | INTRAMUSCULAR | Status: DC
  Administered 2021-06-12: 13:00:00 2 [IU] via SUBCUTANEOUS
  Administered 2021-06-13: 13:00:00 3 [IU] via SUBCUTANEOUS

## 2021-06-11 MED ORDER — SODIUM CHLORIDE 0.9 % IV BOLUS
1000.0000 mL | Freq: Once | INTRAVENOUS | Status: AC
  Administered 2021-06-11: 1000 mL via INTRAVENOUS

## 2021-06-11 MED ORDER — VANCOMYCIN HCL 1500 MG/300ML IV SOLN
1500.0000 mg | Freq: Once | INTRAVENOUS | Status: AC
  Administered 2021-06-11: 1500 mg via INTRAVENOUS
  Filled 2021-06-11: qty 300

## 2021-06-11 MED ORDER — SODIUM CHLORIDE 0.9 % IV SOLN: 2.0000 g | Freq: Once | INTRAVENOUS | Status: DC

## 2021-06-11 MED ORDER — VANCOMYCIN HCL IN DEXTROSE 1-5 GM/200ML-% IV SOLN: 1000.0000 mg | Freq: Once | INTRAVENOUS | Status: DC

## 2021-06-11 MED ORDER — HYDRALAZINE HCL 25 MG PO TABS
25.0000 mg | ORAL_TABLET | Freq: Three times a day (TID) | ORAL | Status: DC
  Administered 2021-06-11 – 2021-06-13 (×5): 25 mg via ORAL
  Filled 2021-06-11 (×6): qty 1

## 2021-06-11 MED ORDER — APIXABAN 5 MG PO TABS
5.0000 mg | ORAL_TABLET | Freq: Two times a day (BID) | ORAL | Status: DC
  Administered 2021-06-11 – 2021-06-15 (×8): 5 mg via ORAL
  Filled 2021-06-11 (×8): qty 1

## 2021-06-11 MED ORDER — LISINOPRIL 10 MG PO TABS
20.0000 mg | ORAL_TABLET | Freq: Every day | ORAL | Status: DC
  Administered 2021-06-12 – 2021-06-15 (×4): 20 mg via ORAL
  Filled 2021-06-11 (×4): qty 2

## 2021-06-11 MED ORDER — SODIUM CHLORIDE 0.9 % IV SOLN
2.0000 g | Freq: Once | INTRAVENOUS | Status: AC
  Administered 2021-06-11: 2 g via INTRAVENOUS
  Filled 2021-06-11: qty 2

## 2021-06-11 MED ORDER — VANCOMYCIN HCL IN DEXTROSE 1-5 GM/200ML-% IV SOLN: 1000.0000 mg | INTRAVENOUS | Status: DC

## 2021-06-11 MED ORDER — ASPIRIN EC 81 MG PO TBEC
81.0000 mg | DELAYED_RELEASE_TABLET | Freq: Every day | ORAL | Status: DC
  Administered 2021-06-12 – 2021-06-15 (×4): 81 mg via ORAL
  Filled 2021-06-11 (×5): qty 1

## 2021-06-11 MED ORDER — COLESEVELAM HCL 625 MG PO TABS
1875.0000 mg | ORAL_TABLET | Freq: Two times a day (BID) | ORAL | Status: DC
  Administered 2021-06-11 – 2021-06-14 (×6): 1875 mg via ORAL
  Filled 2021-06-11 (×13): qty 3

## 2021-06-11 MED ORDER — POLYETHYLENE GLYCOL 3350 17 G PO PACK
17.0000 g | PACK | Freq: Every day | ORAL | Status: DC
  Administered 2021-06-11 – 2021-06-15 (×5): 17 g via ORAL
  Filled 2021-06-11 (×5): qty 1

## 2021-06-11 MED ORDER — METRONIDAZOLE 500 MG/100ML IV SOLN
500.0000 mg | Freq: Two times a day (BID) | INTRAVENOUS | Status: DC
  Administered 2021-06-12: 02:00:00 500 mg via INTRAVENOUS
  Filled 2021-06-11: qty 100

## 2021-06-11 MED ORDER — ONDANSETRON HCL 4 MG PO TABS
4.0000 mg | ORAL_TABLET | Freq: Four times a day (QID) | ORAL | Status: DC | PRN
  Administered 2021-06-12 – 2021-06-14 (×3): 4 mg via ORAL
  Filled 2021-06-11 (×3): qty 1

## 2021-06-11 MED ORDER — INSULIN ASPART 100 UNIT/ML IJ SOLN
0.0000 [IU] | Freq: Every day | INTRAMUSCULAR | Status: DC
  Administered 2021-06-11: 4 [IU] via SUBCUTANEOUS

## 2021-06-11 MED ORDER — METRONIDAZOLE 500 MG/100ML IV SOLN
500.0000 mg | Freq: Once | INTRAVENOUS | Status: AC
  Administered 2021-06-11: 500 mg via INTRAVENOUS
  Filled 2021-06-11: qty 100

## 2021-06-11 NOTE — ED Triage Notes (Signed)
Pt bib ems from home for syncopial episode while on the toilet.  Call was originally for hypoglycemia but fire dept bgl 114.  Pt waslked to the door on ems arrival but then had to go to bathroom.  Pt became unresponsive while on the toilet.  Pt initally in trigeminy on monitor and then became bradicardic on monitor in the 96s.  Pt is alert on arrival.  Resp even and unlabored.

## 2021-06-11 NOTE — Progress Notes (Signed)
Pharmacy Antibiotic Note  Cassandra Adams a 75 y.o. female admitted on 06/11/2021 with sepsis.  Pharmacy has been consulted for vancomycin and cefepime dosing.  Plan: Vancomycin 1000mg  IV every 24 hours.  Goal trough 15-20 mcg/mL. Cefepime 2gm IV every 12 hours.  Medical History: Past Medical History:  Diagnosis Date   Arthritis    "all over"   CAD (coronary artery disease)    a. NSTEMI 12/2013 - occluded dominant RCA with L-R collaterals, moderate prox segmental LAD disease, for medical therapy initially, EF 60% with subtle inferobasal hypokinesia. Consider PCI for refractory CP.   CKD (chronic kidney disease), stage III ([place])    Hypertension    Migraines    "weekly" (01/06/2014)   Sickle cell trait (HCC)    TIA (transient ischemic attack) 1978   Type II diabetes mellitus (HCC)    Vertigo     Allergies:  Allergies  Allergen Reactions   Penicillins Hives    Has patient had a PCN reaction causing immediate rash, facial/tongue/throat swelling, SOB or lightheadedness with hypotension: no Has patient had a PCN reaction causing severe rash involving mucus membranes or skin necrosis: No  Has patient had a PCN reaction that required hospitalization: no Has patient had a PCN reaction occurring within the last 10 years: no If all of the above answers are "NO", then may proceed with Cephalosporin use.     Filed Weights   06/11/21 1329  Weight: 78.5 kg (173 lb)    CBC Latest Ref Rng & Units 06/11/2021 01/25/2021 01/16/2021  WBC 4.0 - 10.5 K/uL 4.3 4.0 6.8  Hemoglobin 12.0 - 15.0 g/dL 12.2 10.6(L) 10.4(L)  Hematocrit 36.0 - 46.0 % 34.6(L) 31.6(L) 31.4(L)  Platelets 150 - 400 K/uL 167 169 184     Estimated Creatinine Clearance: 38.1 mL/min (A) (by C-G formula based on SCr of 1.33 mg/dL (H)).  Antibiotics Given (last 72 hours)     Date/Time Action Medication Dose Rate   06/11/21 1420 New Bag/Given   ceFEPIme (MAXIPIME) 2 g in sodium chloride 0.9 % 100 mL IVPB 2 g 200 mL/hr    06/11/21 1439 New Bag/Given   vancomycin (VANCOREADY) IVPB 1500 mg/300 mL 1,500 mg 150 mL/hr       Antimicrobials this admission:  Cefepime 06/11/2021  >>  vancomycin 06/11/2021  >>  Metronidazole 06/11/2021   x 1   Microbiology results: 06/11/2021  BCx: sent 06/11/2021  UCx: sent 06/11/2021  Resp Panel: sent   Thank you for allowing pharmacy to be a part of this patients care.  Thomasenia Sales, PharmD Clinical Pharmacist

## 2021-06-11 NOTE — ED Provider Notes (Signed)
[facility] EMERGENCY DEPARTMENT Provider Note   CSN: [id number] Arrival date & time: 06/11/21  1317     History Chief Complaint  Patient presents with   Loss of Consciousness    Cassandra Adams is a 75 y.o. female.  HPI  Patient with medical history including A. fib currently on Eliquis, hypertension, diabetes type 2, CKD stage III, CHF diastolic, CAD, TIA presents the emergency department after a syncopal episode.  Per EMS they were called out due to low blood sugar but on arrival patient had a syncopal episode while she was on the commode and was brought here, they noted that her heart rate was in the 30s.  Patient states that today when she woke up she felt okay but after she ate breakfast she started to feel weak, she states she had generalized weakness no unilateral weakness, she states that she felt a little anxious and she typically feels like this when her blood sugar was low but did not check it. While on the commode, she was unable to get up off the commode she states that she was just stuck there.  She denies prior to this episode having headaches, change in vision, paresthesias or weakness upper lower extremities, denies any chest pain shortness of breath lightheaded or dizziness.  He states that she did not lose conscious, she not bite her tongue, did not urinate herself, she has no history of seizure disorder.  She states now she currently feels fine but just feels weak, she does not endorse any fevers, chills, nasal congestion, cough, stomach pains, nausea, vomit, diarrhea states that she has been tolerating p.o., and been taking all of her medications as prescribed.  Past Medical History:  Diagnosis Date   Arthritis    "all over"   CAD (coronary artery disease)    a. NSTEMI 12/2013 - occluded dominant RCA with L-R collaterals, moderate prox segmental LAD disease, for medical therapy initially, EF 60% with subtle inferobasal hypokinesia. Consider PCI for refractory CP.    CKD (chronic kidney disease), stage III ([place])    Hypertension    Migraines    "weekly" (01/06/2014)   Sickle cell trait ([place])    TIA (transient ischemic attack) 1978   Type II diabetes mellitus ([place])    Vertigo     Patient Active Problem List   Diagnosis Date Noted   Secondary hypercoagulable state ([place]) 01/25/2021   Acute pulmonary edema (HCC)    Acute CHF (congestive heart failure) ([place]) 01/16/2021   Acute respiratory distress 01/16/2021   Microcytic anemia 01/16/2021   Hypertensive urgency 01/16/2021   Acute on chronic diastolic (congestive) heart failure ([place]) 01/16/2021   Persistent atrial fibrillation (HCC)    Gastric nodule    Acute blood loss anemia 11/07/2020   Hematemesis with nausea 11/07/2020   Atrial fibrillation, chronic ([place]) 11/07/2020   Transient hypotension 11/07/2020   Atypical chest pain 11/07/2020   Elevated troponin 11/07/2020   Syncope and collapse 11/05/2020   CAD (coronary artery disease)    Epistaxis due to trauma    Benign paroxysmal positional vertigo 05/11/2019   Chronic headaches 05/11/2019   Dizziness and giddiness 05/11/2019   Atrial fibrillation with RVR ([place])    Right hemiparesis ([place]) 05/05/2019   Uncontrolled type 2 diabetes mellitus with hyperglycemia ([place]) 05/05/2019   CKD stage 3 due to type 2 diabetes mellitus ([place]) 05/05/2019   Left-sided weakness 05/04/2019   Aphasia 02/26/2019   DM type 2 causing vascular disease ([place]) 06/11/2017   Type  2 diabetes mellitus with stage 3a chronic kidney disease, with long-term current use of insulin ([place]) 06/11/2017   Carotid stenosis 02/05/2017   TIA (transient ischemic attack) 01/20/2017   Carotid stenosis, right 01/20/2017   Diabetic hyperosmolar non-ketotic state ([place]) 01/19/2017   Acute kidney injury superimposed on CKD ([place]) 01/19/2017   Left leg weakness 01/19/2017   Dyslipidemia 11/24/2016   Edema 03/30/2016   Diaphoresis 02/18/2016   Hypoglycemia 02/18/2016   Bradycardia 04/22/2015    Coronary artery disease due to lipid rich plaque 02/26/2014   AF (paroxysmal atrial fibrillation) ([place]) 02/26/2014   NSTEMI (non-ST elevated myocardial infarction) ([place]) 01/03/2014   Occlusion and stenosis of carotid artery without mention of cerebral infarction 11/21/2013   Pain in limb-Left neck 11/21/2013   Carotid stenosis, bilateral 11/17/2011   Subclinical hyperthyroidism 02/15/2010   GERD 02/15/2010   DYSPHAGIA UNSPECIFIED 02/15/2010   ABDOMINAL PAIN, UNSPECIFIED SITE 02/15/2010   Controlled insulin-dependent diabetes mellitus with neuropathy 02/11/2010   Essential hypertension, benign 02/11/2010   HEADACHE 02/11/2010    Past Surgical History:  Procedure Laterality Date   APPENDECTOMY  March 2014   had appendix frozen   CARDIAC CATHETERIZATION  "years ago" & 01/05/2014   CARDIOVERSION N/A 03/17/2014   Procedure: CARDIOVERSION;  Surgeon: [name], MD;  Location: [place];  Service: Cardiovascular;  Laterality: N/A;   CARDIOVERSION N/A 01/14/2021   Procedure: CARDIOVERSION;  Surgeon: [name], MD;  Location: [place];  Service: Cardiovascular;  Laterality: N/A;   CATARACT EXTRACTION W/ INTRAOCULAR LENS  IMPLANT, BILATERAL Bilateral 04/2013-05/2013   ENDARTERECTOMY Right 02/05/2017   Procedure: ENDARTERECTOMY CAROTID-RIGHT;  Surgeon: [name], MD;  Location: [facility];  Service: Vascular;  Laterality: Right;   ESOPHAGOGASTRODUODENOSCOPY  11/08/2007    Normal esophagus without evidence of Barrett, mass, erosion/ Normal stomach, duodenal bulb   ESOPHAGOGASTRODUODENOSCOPY (EGD) WITH PROPOFOL N/A 11/08/2020   Procedure: ESOPHAGOGASTRODUODENOSCOPY (EGD) WITH PROPOFOL;  Surgeon: Thornton Park, MD;  Location: [place];  Service: Gastroenterology;  Laterality: N/A;   GASTRIC MOTILITY STUDY  11/13/2007   mildly delayed emptying subjectively, but normal  analysis-77% of tracer emptied at 2 hours   JOINT REPLACEMENT     LAPAROSCOPIC APPENDECTOMY N/A  09/15/2012   Procedure: APPENDECTOMY LAPAROSCOPIC;  Surgeon: [name], MD;  Location: [facility];  Service: General;  Laterality: N/A;   LEFT HEART CATHETERIZATION WITH CORONARY ANGIOGRAM N/A 01/05/2014   Procedure: LEFT HEART CATHETERIZATION WITH CORONARY ANGIOGRAM;  Surgeon: [name], MD;  Location: [facility] CATH LAB;  Service: Cardiovascular;  Laterality: N/A;   REPLACEMENT TOTAL KNEE Right 05-03-06   TEE WITHOUT CARDIOVERSION N/A 03/17/2014   Procedure: TRANSESOPHAGEAL ECHOCARDIOGRAM (TEE);  Surgeon: [name], MD;  Location: [facility] ENDOSCOPY;  Service: Cardiovascular;  Laterality: N/A;   TONSILLECTOMY  1972     OB History     Gravida  2   Para  2   Term  2   Preterm      AB      Living  2      SAB      IAB      Ectopic      Multiple      Live Births              Family History  Problem Relation Age of Onset   Hyperlipidemia Mother    Hypertension Mother    Heart attack Mother    Cancer Father        lung   Hypertension  Sister    Diabetes Brother    Heart disease Brother    Hyperlipidemia Brother    Hypertension Brother    Heart attack Brother    Other Brother        DVT    Social History   Tobacco Use   Smoking status: Never   Smokeless tobacco: Never  Vaping Use   Vaping Use: Never used  Substance Use Topics   Alcohol use: No   Drug use: No    Home Medications Prior to Admission medications   Medication Sig Start Date End Date Taking? Authorizing Provider  aspirin EC 81 MG tablet Take 1 tablet (81 mg total) by mouth daily. 11/08/20  Yes Johnson, Clanford L, MD  atorvastatin (LIPITOR) 80 MG tablet Take 1 tablet (80 mg total) by mouth every evening. 12/21/20  Yes Hilty, Nadean Corwin, MD  carvedilol (COREG) 12.5 MG tablet Take 0.5 tablets (6.25 mg total) by mouth 2 (two) times daily with a meal. 01/14/21 06/11/21 Yes Hilty, Nadean Corwin, MD  colesevelam (WELCHOL) 625 MG tablet Take 1,875 mg by mouth 2 (two) times daily with a meal. Noon and  night   Yes [provider]  ELIQUIS 5 MG TABS tablet Take 1 tablet (5 mg total) by mouth 2 (two) times daily. 11/07/20  Yes Johnson, Clanford L, MD  furosemide (LASIX) 40 MG tablet Take 40 mg  one tablet by mouth daily , may take an additional 40 mg if needed daily if leg edema worsens 01/10/21  Yes Almyra Deforest, PA  glipiZIDE (GLUCOTROL XL) 2.5 MG 24 hr tablet TAKE 1 TABLET (2.5 MG TOTAL) BY MOUTH DAILY WITH BREAKFAST. Patient taking differently: Take 2.5 mg by mouth daily with breakfast. 04/25/21  Yes Nida, Marella Chimes, MD  hydrALAZINE (APRESOLINE) 50 MG tablet Take 1 tablet (50 mg total) by mouth 3 (three) times daily. Patient taking differently: Take 25 mg by mouth 3 (three) times daily. 03/15/21  Yes Meng, [place]alt, PA  Insulin Glargine (BASAGLAR KWIKPEN) 100 UNIT/ML INJECT 32 UNITS INTO THE SKIN AT BEDTIME. 04/19/21  Yes Reardon, Loree Fee J, NP  lisinopril (ZESTRIL) 20 MG tablet Take 1 tablet (20 mg total) by mouth daily. 12/21/20  Yes Hilty, Nadean Corwin, MD  nitroGLYCERIN (NITROSTAT) 0.4 MG SL tablet PLACE 1 TABLET UNDER THE TONGUE EVERY 5 MINUTES AS NEEDED FOR CHEST PAIN (UP TO 3 DOSES) Patient taking differently: Place 0.4 mg under the tongue every 5 (five) minutes as needed. 03/25/21  Yes Hilty, Nadean Corwin, MD  NOVOLOG 100 UNIT/ML injection Inject 6-9 Units into the skin 3 (three) times daily with meals. Sliding Scale 07/28/20  Yes [provider]  pantoprazole (PROTONIX) 40 MG tablet Take 1 tablet (40 mg total) by mouth 2 (two) times daily. 11/11/20 06/11/21 Yes Dessa Phi, DO  polyethylene glycol (MIRALAX / GLYCOLAX) 17 g packet Take 17 g by mouth daily. 11/12/20  Yes Dessa Phi, DO  senna-docusate (SENOKOT-S) 8.6-50 MG tablet Take 1 tablet by mouth at bedtime as needed for mild constipation. 11/11/20  Yes Dessa Phi, DO  Vitamin D, Cholecalciferol, 25 MCG (1000 UT) TABS Take 2,000 Units by mouth every other day.   Yes [provider]  BD INSULIN SYRINGE U/F 31G X  5/16" 0.5 ML MISC Use to take with Novolog [facility] 01/07/21   Cassandria Anger, MD    Allergies    Penicillins  Review of Systems   Review of Systems  Constitutional:  Positive for fatigue. Negative for chills and fever.  HENT:  Negative for congestion.   Respiratory:  Negative for shortness of breath.   Cardiovascular:  Negative for chest pain.  Gastrointestinal:  Negative for abdominal pain, nausea and vomiting.  Genitourinary:  Positive for frequency. Negative for difficulty urinating, dysuria and enuresis.  Musculoskeletal:  Negative for back pain.  Skin:  Negative for rash.  Neurological:  Positive for syncope. Negative for dizziness and headaches.  Hematological:  Does not bruise/bleed easily.   Physical Exam Updated Vital Signs BP (!) 155/62    Pulse (!) 55    Temp (!) 97.5 F (36.4 C) (Oral)    Resp 15    Ht 5\' 6"  (1.676 m)    Wt 78.5 kg    SpO2 94%    BMI 27.92 kg/m   Physical Exam Vitals and nursing note reviewed.  Constitutional:      General: She is not in acute distress.    Appearance: She is not ill-appearing.  HENT:     Head: Normocephalic and atraumatic.     Nose: No congestion.     Mouth/Throat:     Mouth: Mucous membranes are moist.     Pharynx: Oropharynx is clear. No oropharyngeal exudate or posterior oropharyngeal erythema.  Eyes:     Extraocular Movements: Extraocular movements intact.     Conjunctiva/sclera: Conjunctivae normal.     Pupils: Pupils are equal, round, and reactive to light.  Cardiovascular:     Rate and Rhythm: Normal rate. Rhythm irregular.     Pulses: Normal pulses.     Heart sounds: No murmur heard.   No friction rub. No gallop.  Pulmonary:     Effort: No respiratory distress.     Breath sounds: No wheezing, rhonchi or rales.  Abdominal:     Palpations: Abdomen is soft.     Tenderness: There is no abdominal tenderness. There is no right CVA tenderness or left CVA tenderness.  Musculoskeletal:     Right lower leg: No edema.      Left lower leg: No edema.  Skin:    General: Skin is warm and dry.  Neurological:     Mental Status: She is alert.     GCS: GCS eye subscore is 4. GCS verbal subscore is 5. GCS motor subscore is 6.     Comments: Cranial nerves II through XII grossly intact, no difficulty with word finding, no slurring of her words, able follow two-step commands, no unilateral weakness present.  Sensation fully intact.  Psychiatric:        Mood and Affect: Mood normal.    ED Results / Procedures / Treatments   Labs (all labs ordered are listed, but only abnormal results are displayed) Labs Reviewed  LACTIC ACID, PLASMA - Abnormal; Notable for the following components:      Result Value   Lactic Acid, Venous 2.9 (*)    All other components within normal limits  COMPREHENSIVE METABOLIC PANEL - Abnormal; Notable for the following components:   Sodium 134 (*)    Glucose, Bld 207 (*)    BUN 27 (*)    Creatinine, Ser 1.33 (*)    Calcium 10.6 (*)    Total Protein 8.7 (*)    GFR, Estimated 41 (*)    All other components within normal limits  CBC WITH DIFFERENTIAL/PLATELET - Abnormal; Notable for the following components:   HCT 34.6 (*)    All other components within normal limits  APTT - Abnormal; Notable for the following components:   aPTT 37 (*)  All other components within normal limits  URINALYSIS, ROUTINE W REFLEX MICROSCOPIC - Abnormal; Notable for the following components:   Color, Urine COLORLESS (*)    Specific Gravity, Urine 1.002 (*)    Glucose, UA >=500 (*)    Hgb urine dipstick SMALL (*)    Protein, ur 30 (*)    All other components within normal limits  BRAIN NATRIURETIC PEPTIDE - Abnormal; Notable for the following components:   B Natriuretic Peptide 275.0 (*)    All other components within normal limits  CBG MONITORING, ED - Abnormal; Notable for the following components:   Glucose-Capillary 202 (*)    All other components within normal limits  RESP PANEL BY RT-PCR (FLU  A&B, COVID) ARPGX2  CULTURE, BLOOD (ROUTINE X 2)  CULTURE, BLOOD (ROUTINE X 2)  URINE CULTURE  LACTIC ACID, PLASMA  PROTIME-INR  BLOOD GAS, VENOUS  TSH  PROCALCITONIN  PROCALCITONIN  POC OCCULT BLOOD, ED    EKG None  Radiology DG Chest Port 1 View  Result Date: 06/11/2021 CLINICAL DATA:  Questionable sepsis - evaluate for abnormality EXAM: PORTABLE CHEST 1 VIEW COMPARISON:  Radiograph 01/16/2021, chest CT 09/07/2020 FINDINGS: Unchanged cardiomediastinal silhouette. No new focal airspace disease. No pleural effusion. No pneumothorax. No acute osseous abnormality. IMPRESSION: No evidence of acute cardiopulmonary disease. Electronically Signed   By: Maurine Simmering M.D.   On: 06/11/2021 14:17    Procedures .Critical Care Performed by: Marcello Fennel, PA-C Authorized by: Marcello Fennel, PA-C   Critical care provider statement:    Critical care time (minutes):  30   Critical care was necessary to treat or prevent imminent or life-threatening deterioration of the following conditions:  Sepsis   Critical care was time spent personally by me on the following activities:  Evaluation of patient's response to treatment, examination of patient, ordering and review of laboratory studies, ordering and review of radiographic studies, ordering and performing treatments and interventions, pulse oximetry, re-evaluation of patient's condition, review of old charts and discussions with consultants   I assumed direction of critical care for this patient from another provider in my specialty: no     Care discussed with: admitting provider     Medications Ordered in ED Medications  lactated ringers infusion ( Intravenous New Bag/Given 06/11/21 1359)  ceFEPIme (MAXIPIME) 2 g in sodium chloride 0.9 % 100 mL IVPB (has no administration in time range)  vancomycin (VANCOCIN) IVPB 1000 mg/200 mL premix (has no administration in time range)  metroNIDAZOLE (FLAGYL) IVPB 500 mg (0 mg Intravenous Stopped  06/11/21 1626)  sodium chloride 0.9 % bolus 1,000 mL (1,000 mLs Intravenous New Bag/Given 06/11/21 1433)  vancomycin (VANCOREADY) IVPB 1500 mg/300 mL (1,500 mg Intravenous New Bag/Given 06/11/21 1439)  ceFEPIme (MAXIPIME) 2 g in sodium chloride 0.9 % 100 mL IVPB (0 g Intravenous Stopped 06/11/21 1456)    ED Course  I have reviewed the triage vital signs and the nursing notes.  Pertinent labs & imaging results that were available during my care of the patient were reviewed by me and considered in my medical decision making (see chart for details).    MDM Rules/Calculators/A&P                         Initial impression-presents with syncope and weakness, she is alert, does not show signs acute distress, patient has a heart rate which I palpated personally at 60, she had a rectal temperature of 93.8 with a normal BP  we will activate code sepsis and continue to monitor.  Work-up-CBC unremarkable, CMP sodium 134, glucose 2 7 BUN of 27, creatinine 1.33 calcium 10.6, prothrombin time INR unremarkable, APTT 37 Hemoccult negative, blood gas unremarkable, BNP 275, first lactic 2.9, TSH 0.5, UA negative for infection or hematuria, chest x-ray unremarkable, EKG sinus rhythm with trigeminy no signs of ischemia.  Reassessment-patient is reassessed, heart rate has improved, oral temperature is now 97.5 we will continue to monitor.  Patient is reassessed continued has no complaints this time, vital signs remained stable, recommend admission for further observation as she does has increase lactic and was hypothermic on presentation.  Patient is agreement this plan will consult hospitalist team for admission.  Consult-spoke with Dr. Sheran Lawless who will admit the patient.  Rule out-I have low suspicion for intracranial head bleed and/or CVA patient denies any headaches, change in vision, paresthesias or weakness upper lower extremities, there is no focal deficit present my exam.  Patient states that she almost  passed out while she is on the commode presentation more consistent with vasovagal hypotension.  I have low suspicion for pneumonia as lung sounds are clear bilaterally, chest x-ray is unremarkable.  low suspicion for ACS patient denies chest pain, shortness of breath, EKG without signs of ischemia.  Low suspicion for CHF exacerbation as lung sounds are clear bilaterally, no peripheral edema present, no pleural effusions noted on chest x-ray.  BNP slightly elevated but is below her baseline.  Low suspicion for intra-abdominal infection as abdomen soft nontender to palpation, she denies nausea, vomit, constipation or diarrhea.  We will suspicion for UTI [place] or kidney stone as UA is negative for sign infection or hematuria.  Plan-admission for observation.  Likely patient was hypoglycemic and this would explain her bradycardia, altered mental status, hypothermia but possible this could be secondary due to bacterial infection I would recommend  continue with broad-spectrum antibiotics and de-escalate as necessary.     Final Clinical Impression(s) / ED Diagnoses Final diagnoses:  Near syncope    Rx / DC Orders ED Discharge Orders     None        Marcello Fennel, PA-C 06/11/21 1714    Margette Fast, MD 06/12/21 [phone]

## 2021-06-11 NOTE — H&P (Signed)
History and Physical  [name] OEV:[id number] DOB: [birth year] DOA: 06/11/2021  Referring physician: Deno Etienne, PA-C, ED physician PCP: Sharilyn Sites, MD  Outpatient Specialists:   Patient Coming From: home  Chief Complaint: Bradycardia and episode of confusion  HPI: Cassandra Adams is a 75 y.o. female with a history of diabetes, coronary artery disease with history of NSTEMI in 2015, stage III chronic kidney disease, hypertension, history of TIAs, type 2 diabetes on insulin.  As the patient does not have recollection of the events, history is obtained by medical records, ER personnel, patient's daughter.  Up until the event, the patient had been feeling fine with no feelings of illness.  She had went to the bathroom and was having difficulty getting up from the toilet.  She felt weak, cold, confused, shaky.  One of her granddaughters came and checked on her, then called the patient's daughter.  The patient was acting like her blood sugar was low.  They tried to check her blood sugar several times, but was unable to draw any blood from her fingers due to her hands being very cold.  EMS was called.  While waiting for EMS, the patient had some juice and food.  By the time EMS checked her blood sugar, they noted that it was 114.  The patient was bradycardic with a heart rate in the 30s.  The patient was not experiencing any chest pain, shortness of breath.  Emergency Department Course: Initially in the 40s.  Patient initially hypothermic.  Patient put on a Retail [employer].  Blood work showed white count normal blood sugars in the 200s.  Patient's started on IV fluids, broad-spectrum antibiotics, blood cultures obtained, urine culture obtained.  Initial lactic acid 2.9 and improved to 1.3.  Review of Systems:   Pt denies any fevers, chills, nausea, vomiting, diarrhea, constipation, abdominal pain, shortness of breath, dyspnea on exertion, orthopnea, cough, wheezing, palpitations,  headache, vision changes, lightheadedness, dizziness, melena, rectal bleeding.  Review of systems are otherwise negative  Past Medical History:  Diagnosis Date   Arthritis    "all over"   CAD (coronary artery disease)    a. NSTEMI 12/2013 - occluded dominant RCA with L-R collaterals, moderate prox segmental LAD disease, for medical therapy initially, EF 60% with subtle inferobasal hypokinesia. Consider PCI for refractory CP.   CKD (chronic kidney disease), stage III ([place])    Hypertension    Migraines    "weekly" (01/06/2014)   Sickle cell trait ([place])    TIA (transient ischemic attack) 1978   Type II diabetes mellitus ([place])    Vertigo    Past Surgical History:  Procedure Laterality Date   APPENDECTOMY  March 2014   had appendix frozen   CARDIAC CATHETERIZATION  "years ago" & 01/05/2014   CARDIOVERSION N/A 03/17/2014   Procedure: CARDIOVERSION;  Surgeon: [name], MD;  Location: [place];  Service: Cardiovascular;  Laterality: N/A;   CARDIOVERSION N/A 01/14/2021   Procedure: CARDIOVERSION;  Surgeon: [name], MD;  Location: [place];  Service: Cardiovascular;  Laterality: N/A;   CATARACT EXTRACTION W/ INTRAOCULAR LENS  IMPLANT, BILATERAL Bilateral 04/2013-05/2013   ENDARTERECTOMY Right 02/05/2017   Procedure: ENDARTERECTOMY CAROTID-RIGHT;  Surgeon: [name], MD;  Location: [place];  Service: Vascular;  Laterality: Right;   ESOPHAGOGASTRODUODENOSCOPY  11/08/2007    Normal esophagus without evidence of Barrett, mass, erosion/ Normal stomach, duodenal bulb   ESOPHAGOGASTRODUODENOSCOPY (EGD) WITH PROPOFOL N/A 11/08/2020   Procedure: ESOPHAGOGASTRODUODENOSCOPY (EGD) WITH PROPOFOL;  Surgeon:  Thornton Park, MD;  Location: [place];  Service: Gastroenterology;  Laterality: N/A;   GASTRIC MOTILITY STUDY  11/13/2007   mildly delayed emptying subjectively, but normal  analysis-77% of tracer emptied at 2 hours   JOINT REPLACEMENT     LAPAROSCOPIC APPENDECTOMY N/A  09/15/2012   Procedure: APPENDECTOMY LAPAROSCOPIC;  Surgeon: [name], MD;  Location: [facility];  Service: General;  Laterality: N/A;   LEFT HEART CATHETERIZATION WITH CORONARY ANGIOGRAM N/A 01/05/2014   Procedure: LEFT HEART CATHETERIZATION WITH CORONARY ANGIOGRAM;  Surgeon: [name], MD;  Location[facility] C[facility];  Service: Cardiovascular;  Laterality: N/A;   REPLACEMENT TOTAL KNEE Right 05-03-06   TEE WITHOUT CARDIOVERSION N/A 03/17/2014   Procedure: TRANSESOPHAGEAL ECHOCARDIOGRAM (TEE);  Surgeon: [name], MD;  Location: [facility] ENDOSCOPY;  Service: Cardiovascular;  Laterality: N/A;   TONSILLECTOMY  1972   Social History:  reports that she has never smoked. She has never used smokeless tobacco. She reports that she does not drink alcohol and does not use drugs. Patient lives at home  Allergies  Allergen Reactions   Penicillins Hives    Has patient had a PCN reaction causing immediate rash, facial/tongue/throat swelling, SOB or lightheadedness with hypotension: no Has patient had a PCN reaction causing severe rash involving mucus membranes or skin necrosis: No  Has patient had a PCN reaction that required hospitalization: no Has patient had a PCN reaction occurring within the last 10 years: no If all of the above answers are "NO", then may proceed with Cephalosporin use.     Family History  Problem Relation Age of Onset   Hyperlipidemia Mother    Hypertension Mother    Heart attack Mother    Cancer Father        lung   Hypertension Sister    Diabetes Brother    Heart disease Brother    Hyperlipidemia Brother    Hypertension Brother    Heart attack Brother    Other Brother        DVT      Prior to Admission medications   Medication Sig Start Date End Date Taking? Authorizing Provider  aspirin EC 81 MG tablet Take 1 tablet (81 mg total) by mouth daily. 11/08/20  Yes Johnson, Clanford L, MD  atorvastatin (LIPITOR) 80 MG tablet Take 1 tablet (80 mg total) by mouth  every evening. 12/21/20  Yes Hilty, Nadean Corwin, MD  carvedilol (COREG) 12.5 MG tablet Take 0.5 tablets (6.25 mg total) by mouth 2 (two) times daily with a meal. 01/14/21 06/11/21 Yes Hilty, Nadean Corwin, MD  colesevelam (WELCHOL) 625 MG tablet Take 1,875 mg by mouth 2 (two) times daily with a meal. Noon and night   Yes [provider]  ELIQUIS 5 MG TABS tablet Take 1 tablet (5 mg total) by mouth 2 (two) times daily. 11/07/20  Yes Johnson, Clanford L, MD  furosemide (LASIX) 40 MG tablet Take 40 mg  one tablet by mouth daily , may take an additional 40 mg if needed daily if leg edema worsens 01/10/21  Yes Almyra Deforest, PA  glipiZIDE (GLUCOTROL XL) 2.5 MG 24 hr tablet TAKE 1 TABLET (2.5 MG TOTAL) BY MOUTH DAILY WITH BREAKFAST. Patient taking differently: Take 2.5 mg by mouth daily with breakfast. 04/25/21  Yes Nida, Marella Chimes, MD  hydrALAZINE (APRESOLINE) 50 MG tablet Take 1 tablet (50 mg total) by mouth 3 (three) times daily. Patient taking differently: Take 25 mg by mouth 3 (three) times daily. 03/15/21  Yes  Ponder, [place], [place]  Insulin Glargine [facility]) 100 UNIT/ML INJECT 32 UNITS INTO THE SKIN AT BEDTIME. 04/19/21  Yes Reardon, Loree Fee J, NP  lisinopril (ZESTRIL) 20 MG tablet Take 1 tablet (20 mg total) by mouth daily. 12/21/20  Yes Hilty, Nadean Corwin, MD  nitroGLYCERIN (NITROSTAT) 0.4 MG SL tablet PLACE 1 TABLET UNDER THE TONGUE EVERY 5 MINUTES AS NEEDED FOR CHEST PAIN (UP TO 3 DOSES) Patient taking differently: Place 0.4 mg under the tongue every 5 (five) minutes as needed. 03/25/21  Yes Hilty, Nadean Corwin, MD  NOVOLOG 100 UNIT/ML injection Inject 6-9 Units into the skin 3 (three) times daily with meals. Sliding Scale 07/28/20  Yes [provider]  pantoprazole (PROTONIX) 40 MG tablet Take 1 tablet (40 mg total) by mouth 2 (two) times daily. 11/11/20 06/11/21 Yes Dessa Phi, DO  polyethylene glycol (MIRALAX / GLYCOLAX) 17 g packet Take 17 g by mouth daily. 11/12/20  Yes Dessa Phi, DO   senna-docusate (SENOKOT-S) 8.6-50 MG tablet Take 1 tablet by mouth at bedtime as needed for mild constipation. 11/11/20  Yes Dessa Phi, DO  Vitamin D, Cholecalciferol, 25 MCG (1000 UT) TABS Take 2,000 Units by mouth every other day.   Yes [provider]  BD INSULIN SYRINGE U/F 31G X 5/16" 0.5 ML MISC Use to take with Novolog [facility] 01/07/21   Cassandria Anger, MD    Physical Exam: BP (!) 155/62    Pulse (!) 55    Temp (!) 97.5 F (36.4 C) (Oral)    Resp 15    Ht 5\' 6"  (1.676 m)    Wt 78.5 kg    SpO2 94%    BMI 27.92 kg/m   General: Elderly female. Awake and alert and oriented x3. No acute cardiopulmonary distress.  HEENT: Normocephalic atraumatic.  Right and left ears normal in appearance.  Pupils equal, round, reactive to light. Extraocular muscles are intact. Sclerae anicteric and noninjected.  Moist mucosal membranes. No mucosal lesions.  Neck: Neck supple without lymphadenopathy. No carotid bruits. No masses palpated.  Cardiovascular: Regular rate with normal S1-S2 sounds. No murmurs, rubs, gallops auscultated. No JVD.  Respiratory: Good respiratory effort with no wheezes, rales, rhonchi. Lungs clear to auscultation bilaterally.  No accessory muscle use. Abdomen: Soft, nontender, nondistended. Active bowel sounds. No masses or hepatosplenomegaly  Skin: No rashes, lesions, or ulcerations.  Dry, warm to touch. 2+ dorsalis pedis and radial pulses. Musculoskeletal: No calf or leg pain. All major joints not erythematous nontender.  No upper or lower joint deformation.  Good ROM.  No contractures  Psychiatric: Intact judgment and insight. Pleasant and cooperative. Neurologic: No focal neurological deficits. Strength is 5/5 and symmetric in upper and lower extremities.  Cranial nerves II through XII are grossly intact.           Labs on Admission: I have personally reviewed following labs and imaging studies  CBC: Recent Labs  Lab 06/11/21 1400  WBC 4.3  NEUTROABS 2.9   HGB 12.2  HCT 34.6*  MCV 81.4  PLT 878   Basic Metabolic Panel: Recent Labs  Lab 06/11/21 1400  NA 134*  K 3.9  CL 99  CO2 27  GLUCOSE 207*  BUN 27*  CREATININE 1.33*  CALCIUM 10.6*   GFR: Estimated Creatinine Clearance: 38.1 mL/min (A) (by C-G formula based on SCr of 1.33 mg/dL (H)). Liver Function Tests: Recent Labs  Lab 06/11/21 1400  AST 39  ALT 24  ALKPHOS 75  BILITOT 0.5  PROT 8.7*  ALBUMIN 4.7   No results for input(s): LIPASE, AMYLASE in the last 168 hours. No results for input(s): AMMONIA in the last 168 hours. Coagulation Profile: Recent Labs  Lab 06/11/21 1400  INR 1.2   Cardiac Enzymes: No results for input(s): CKTOTAL, CKMB, CKMBINDEX, TROPONINI in the last 168 hours. BNP (last 3 results) No results for input(s): PROBNP in the last 8760 hours. HbA1C: No results for input(s): HGBA1C in the last 72 hours. CBG: Recent Labs  Lab 06/11/21 1324  GLUCAP 202*   Lipid Profile: No results for input(s): CHOL, HDL, LDLCALC, TRIG, CHOLHDL, LDLDIRECT in the last 72 hours. Thyroid Function Tests: Recent Labs    06/11/21 1444  TSH 0.500   Anemia Panel: No results for input(s): VITAMINB12, FOLATE, FERRITIN, TIBC, IRON, RETICCTPCT in the last 72 hours. Urine analysis:    Component Value Date/Time   COLORURINE COLORLESS (A) 06/11/2021 1505   APPEARANCEUR CLEAR 06/11/2021 1505   LABSPEC 1.002 (L) 06/11/2021 1505   PHURINE 6.0 06/11/2021 1505   GLUCOSEU >=500 (A) 06/11/2021 1505   HGBUR SMALL (A) 06/11/2021 1505   BILIRUBINUR NEGATIVE 06/11/2021 1505   KETONESUR NEGATIVE 06/11/2021 1505   PROTEINUR 30 (A) 06/11/2021 1505   UROBILINOGEN 0.2 09/15/2012 1010   NITRITE NEGATIVE 06/11/2021 1505   LEUKOCYTESUR NEGATIVE 06/11/2021 1505   Sepsis Labs: @LABRCNTIP (procalcitonin:4,lacticidven:4) ) Recent Results (from the past 240 hour(s))  Resp Panel by RT-PCR (Flu A&B, Covid) Nasopharyngeal Swab     Status: None   Collection Time: 06/11/21  1:53 PM    Specimen: Nasopharyngeal Swab; Nasopharyngeal(NP) swabs in vial transport medium  Result Value Ref Range Status   SARS Coronavirus 2 by RT PCR NEGATIVE NEGATIVE Final    Comment: (NOTE) SARS-CoV-2 target nucleic acids are NOT DETECTED.  The SARS-CoV-2 RNA is generally detectable in upper respiratory specimens during the acute phase of infection. The lowest concentration of SARS-CoV-2 viral copies this assay can detect is 138 copies/mL. A negative result does not preclude SARS-Cov-2 infection and should not be used as the sole basis for treatment or other patient management decisions. A negative result may occur with  improper specimen collection/handling, submission of specimen other than nasopharyngeal swab, presence of viral mutation(s) within the areas targeted by this assay, and inadequate number of viral copies(<138 copies/mL). A negative result must be combined with clinical observations, patient history, and epidemiological information. The expected result is Negative.  Fact Sheet for Patients:  EntrepreneurPulse.com.au  Fact Sheet for Healthcare Providers:  IncredibleEmployment.be  This test is no t yet approved or cleared by the [place] FDA and  has been authorized for detection and/or diagnosis of SARS-CoV-2 by FDA under an Emergency Use Authorization (EUA). This EUA will remain  in effect (meaning this test can be used) for the duration of the COVID-19 declaration under Section 564(b)(1) of the Act, 21 U.S.C.section 360bbb-3(b)(1), unless the authorization is terminated  or revoked sooner.       Influenza A by PCR NEGATIVE NEGATIVE Final   Influenza B by PCR NEGATIVE NEGATIVE Final    Comment: (NOTE) The Xpert Xpress SARS-CoV-2/FLU/RSV plus assay is intended as an aid in the diagnosis of influenza from Nasopharyngeal swab specimens and should not be used as a sole basis for treatment. Nasal washings and aspirates are  unacceptable for Xpert Xpress SARS-CoV-2/FLU/RSV testing.  Fact Sheet for Patients: EntrepreneurPulse.com.au  Fact Sheet for Healthcare Providers: IncredibleEmployment.be  This test is not yet approved or cleared by the [place] and has been authorized for  detection and/or diagnosis of SARS-CoV-2 by FDA under an Emergency Use Authorization (EUA). This EUA will remain in effect (meaning this test can be used) for the duration of the COVID-19 declaration under Section 564(b)(1) of the Act, 21 U.S.C. section 360bbb-3(b)(1), unless the authorization is terminated or revoked.  Performed at [facility], [address], [place], [place] [address]   Blood Culture (routine x 2)     Status: None (Preliminary result)   Collection Time: 06/11/21  1:58 PM   Specimen: Blood  Result Value Ref Range Status   Specimen Description   Final    BLOOD LEFT HAND BOTTLES DRAWN AEROBIC AND ANAEROBIC   Special Requests   Final    Blood Culture adequate volume Performed at [facility], [address], [place], [place] [address]    Culture PENDING  Incomplete   Report Status PENDING  Incomplete  Blood Culture (routine x 2)     Status: None (Preliminary result)   Collection Time: 06/11/21  2:44 PM   Specimen: BLOOD RIGHT HAND  Result Value Ref Range Status   Specimen Description   Final    BLOOD RIGHT HAND BOTTLES DRAWN AEROBIC AND ANAEROBIC   Special Requests   Final    Blood Culture adequate volume Performed at [facility], [address] [place], [place] [address]    Culture PENDING  Incomplete   Report Status PENDING  Incomplete     Radiological Exams on Admission: DG Chest Port 1 View  Result Date: 06/11/2021 CLINICAL DATA:  Questionable sepsis - evaluate for abnormality EXAM: PORTABLE CHEST 1 VIEW COMPARISON:  Radiograph 01/16/2021, chest CT 09/07/2020 FINDINGS: Unchanged cardiomediastinal silhouette. No new focal airspace disease. No pleural  effusion. No pneumothorax. No acute osseous abnormality. IMPRESSION: No evidence of acute cardiopulmonary disease. Electronically Signed   By: Maurine Simmering M.D.   On: 06/11/2021 14:17    EKG: Independently reviewed.  Sinus rhythm.  Borderline ST changes especially in the inferior leads.  No acute ST changes.  Assessment/Plan: Principal Problem:   Sepsis ([place]) Active Problems:   Type 2 diabetes mellitus with diabetic neuropathy (HCC)   Essential hypertension, benign   GERD   Bradycardia   Carotid stenosis   CKD stage 3 due to type 2 diabetes mellitus (HCC)   Atrial fibrillation, chronic ([place])    This patient was discussed with the ED physician, including pertinent vitals, physical exam findings, labs, and imaging.  We also discussed care given by the ED provider.  Possible sepsis with hypothermia Continue broad-spectrum antibiotics Check CBC in the morning Check procalcitonin Blood cultures, urine culture pending  Bradycardia Rate better controlled.  We will continue to watch for improvement and maintain on telemetry monitoring Atrial fibrillation on anticoagulation Rate controlled Continue Eliquis Type 2 diabetes with possible hypoglycemic episode Hypertension Continue antihypertensives GERD Stage III chronic kidney disease Stable  DVT prophylaxis: On Eliquis Consultants: None Code Status: Full code Family Communication: Daughter present during interview and exam Disposition Plan:    Truett Mainland, DO

## 2021-06-11 NOTE — Sepsis Progress Note (Signed)
Elink following code sepsis °

## 2021-06-12 DIAGNOSIS — N183 Chronic kidney disease, stage 3 unspecified: Secondary | ICD-10-CM

## 2021-06-12 DIAGNOSIS — R001 Bradycardia, unspecified: Secondary | ICD-10-CM

## 2021-06-12 DIAGNOSIS — R55 Syncope and collapse: Secondary | ICD-10-CM

## 2021-06-12 DIAGNOSIS — Z794 Long term (current) use of insulin: Secondary | ICD-10-CM

## 2021-06-12 DIAGNOSIS — E1140 Type 2 diabetes mellitus with diabetic neuropathy, unspecified: Secondary | ICD-10-CM

## 2021-06-12 DIAGNOSIS — E1122 Type 2 diabetes mellitus with diabetic chronic kidney disease: Secondary | ICD-10-CM

## 2021-06-12 LAB — HEMOGLOBIN A1C
Hgb A1c MFr Bld: 8.1 % — ABNORMAL HIGH (ref 4.8–5.6)
Mean Plasma Glucose: 185.77 mg/dL

## 2021-06-12 LAB — GLUCOSE, CAPILLARY
Glucose-Capillary: 71 mg/dL (ref 70–99)
Glucose-Capillary: 63 mg/dL — ABNORMAL LOW (ref 70–99)
Glucose-Capillary: 72 mg/dL (ref 70–99)
Glucose-Capillary: 176 mg/dL — ABNORMAL HIGH (ref 70–99)
Glucose-Capillary: 104 mg/dL — ABNORMAL HIGH (ref 70–99)
Glucose-Capillary: 58 mg/dL — ABNORMAL LOW (ref 70–99)
Glucose-Capillary: 66 mg/dL — ABNORMAL LOW (ref 70–99)
Glucose-Capillary: 156 mg/dL — ABNORMAL HIGH (ref 70–99)

## 2021-06-12 LAB — CBC
HCT: 26.7 % — ABNORMAL LOW (ref 36.0–46.0)
Hemoglobin: 9.4 g/dL — ABNORMAL LOW (ref 12.0–15.0)
MCH: 28.7 pg (ref 26.0–34.0)
MCHC: 35.2 g/dL (ref 30.0–36.0)
MCV: 81.4 fL (ref 80.0–100.0)
Platelets: 126 10*3/uL — ABNORMAL LOW (ref 150–400)
RBC: 3.28 MIL/uL — ABNORMAL LOW (ref 3.87–5.11)
RDW: 15 % (ref 11.5–15.5)
WBC: 4.7 10*3/uL (ref 4.0–10.5)
nRBC: 0 % (ref 0.0–0.2)

## 2021-06-12 LAB — BASIC METABOLIC PANEL
Anion gap: 5 (ref 5–15)
BUN: 25 mg/dL — ABNORMAL HIGH (ref 8–23)
CO2: 26 mmol/L (ref 22–32)
Calcium: 9.1 mg/dL (ref 8.9–10.3)
Chloride: 109 mmol/L (ref 98–111)
Creatinine, Ser: 1.2 mg/dL — ABNORMAL HIGH (ref 0.44–1.00)
GFR, Estimated: 47 mL/min — ABNORMAL LOW (ref >60)
Glucose, Bld: 163 mg/dL — ABNORMAL HIGH (ref 70–99)
Potassium: 4.1 mmol/L (ref 3.5–5.1)
Sodium: 140 mmol/L (ref 135–145)

## 2021-06-12 LAB — PROCALCITONIN: Procalcitonin: <0.1 ng/mL

## 2021-06-12 MED ORDER — INSULIN GLARGINE-YFGN 100 UNIT/ML ~~LOC~~ SOLN
25.0000 [IU] | Freq: Every day | SUBCUTANEOUS | Status: DC
Start: 2021-06-12 — End: 2021-06-13
  Administered 2021-06-12: 22:00:00 25 [IU] via SUBCUTANEOUS
  Filled 2021-06-12: qty 0.25

## 2021-06-12 MED ORDER — DICLOFENAC SODIUM 1 % EX GEL
2.0000 g | Freq: Four times a day (QID) | CUTANEOUS | Status: DC
  Administered 2021-06-12 – 2021-06-15 (×14): 2 g via TOPICAL
  Filled 2021-06-12: qty 100

## 2021-06-12 MED ORDER — INSULIN GLARGINE-YFGN 100 UNIT/ML ~~LOC~~ SOLN
30.0000 [IU] | Freq: Every day | SUBCUTANEOUS | Status: DC
Start: 2021-06-12 — End: 2021-06-12
  Filled 2021-06-12: qty 0.3

## 2021-06-12 NOTE — Progress Notes (Signed)
PT Cancellation Note  Patient Details Name: Cassandra Adams MRN: [id number] DOB: 05-21-1946   Cancelled Treatment:    Reason Eval/Treat Not Completed: Patient declined, no reason specified. Patient sitting up in bed with lunch tray that was just provided. Reports she has been getting up and moving around in her room just fine. Declined physical therapy evaluation secondary to wanting to eat her lunch. Will re-attempt evaluation tomorrow.   12:48 PM, 06/12/21 Jerene Pitch, DPT Physical Therapy with [facility]  [phone] office

## 2021-06-12 NOTE — Progress Notes (Signed)
Pt admitted to floor just before start of night shift. Welcomed patient and oriented to unit. Alert and Oriented x 4, VS stable. Able to use bedside commode with 1 assist. IV fluids started. Family at bedside.

## 2021-06-12 NOTE — Progress Notes (Addendum)
Progress Note    Cassandra Adams  DXA:[id number] DOB: May 29, 1946  DOA: 06/11/2021 PCP: Sharilyn Sites, MD    Brief Narrative:     Medical records reviewed and are as summarized below:  Cassandra Adams is an 74 y.o. female with a history of diabetes, coronary artery disease with history of NSTEMI in 2015, stage III chronic kidney disease, hypertension, history of TIAs, type 2 diabetes on insulin.  As the patient does not have recollection of the events, history is obtained by medical records, ER personnel, patient's daughter.  Up until the event, the patient had been feeling fine with no feelings of illness.  She had went to the bathroom and was having difficulty getting up from the toilet.  She felt weak, cold, confused, shaky.  One of her granddaughters came and checked on her, then called the patient's daughter.  The patient was acting like her blood sugar was low.  They tried to check her blood sugar several times, but was unable to draw any blood from her fingers due to her hands being very cold.  EMS was called.  While waiting for EMS, the patient had some juice and food.  By the time EMS checked her blood sugar, they noted that it was 114.  The patient was bradycardic with a heart rate in the 30s.   Assessment/Plan:   Principal Problem:   Sepsis ([place]) Active Problems:   Type 2 diabetes mellitus with diabetic neuropathy (HCC)   Essential hypertension, benign   GERD   Bradycardia   Carotid stenosis   CKD stage 3 due to type 2 diabetes mellitus (HCC)   Atrial fibrillation, chronic (HCC)   Hypoglycemia -takes SSI and 32 units at home (was given 40 units her with subsequent low blood sugar) -will drop to 25 units as she has lost weight at home  Not sepsis -afebrile/no WBC -no source -procalcitonin negative  Bradycardia -hold BB for now -monitor on tele -? Vasovagal  Right shoulder pain -known arthritis -voltaren gel   Atrial fibrillation on  anticoagulation -Rate controlled: HR <60 Continue Eliquis  Type 2 diabetes with possible hypoglycemic episode -see above  Hypertension -Continue antihypertensives with holding parameter on BB  GERD -PPI  Stage IIIa chronic kidney disease Stable  Anemia ? CKD -trend hgb-- baseline seems to be around 10 -denies bleeding -cbc in AM  Hypercalcemia -suspect due to dehydration -resolved  On lasix-- held for now-- can resume tomorrow if appears volume overloaded   Family Communication/Anticipated D/C date and plan/Code Status   DVT prophylaxis: eliquis Code Status: Full Code.  Disposition Plan: Status is: Inpatient  Remains inpatient appropriate because: need further evaluation of episode and hypoglycemia         Medical Consultants:   None.    Subjective:   Multiple complaints: right shoulder pain, dizziness, some nausea  Objective:    Vitals:   06/12/21 0938 06/12/21 1115 06/12/21 1118 06/12/21 1119  BP: (!) 141/55 (!) 141/58 (!) 162/70 (!) 159/67  Pulse: (!) 58 (!) 57 60 70  Resp: 16 15 15 16   Temp:  97.9 F (36.6 C)    TempSrc:      SpO2: 99% 100% 99% 100%  Weight:      Height:        Intake/Output Summary (Last 24 hours) at 06/12/2021 1208 Last data filed at 06/12/2021 0900 Gross per 24 hour  Intake 2972.96 ml  Output --  Net 2972.96 ml   [employer]  06/11/21 1329  Weight: 78.5 kg    Exam:  General: Appearance:     Overweight female in no acute distress     Lungs:     respirations unlabored  Heart:    Normal heart rate.   MS:   All extremities are intact. Right shoulder w/o warmth or redness, not tender to touch   Neurologic:   Awake, alert, oriented x 3.      Data Reviewed:   I have personally reviewed following labs and imaging studies:  Labs: Labs show the following:   Basic Metabolic Panel: Recent Labs  Lab 06/11/21 1400 06/12/21 0525  NA 134* 140  K 3.9 4.1  CL 99 109  CO2 27 26  GLUCOSE 207* 163*   BUN 27* 25*  CREATININE 1.33* 1.20*  CALCIUM 10.6* 9.1   GFR Estimated Creatinine Clearance: 42.2 mL/min (A) (by C-G formula based on SCr of 1.2 mg/dL (H)). Liver Function Tests: Recent Labs  Lab 06/11/21 1400  AST 39  ALT 24  ALKPHOS 75  BILITOT 0.5  PROT 8.7*  ALBUMIN 4.7   No results for input(s): LIPASE, AMYLASE in the last 168 hours. No results for input(s): AMMONIA in the last 168 hours. Coagulation profile Recent Labs  Lab 06/11/21 1400  INR 1.2    CBC: Recent Labs  Lab 06/11/21 1400 06/12/21 0525  WBC 4.3 4.7  NEUTROABS 2.9  --   HGB 12.2 9.4*  HCT 34.6* 26.7*  MCV 81.4 81.4  PLT 167 126*   Cardiac Enzymes: No results for input(s): CKTOTAL, CKMB, CKMBINDEX, TROPONINI in the last 168 hours. BNP (last 3 results) No results for input(s): PROBNP in the last 8760 hours. CBG: Recent Labs  Lab 06/11/21 2105 06/12/21 0728 06/12/21 0835 06/12/21 0852 06/12/21 1127  GLUCAP 329* 71 63* 72 176*   D-Dimer: No results for input(s): DDIMER in the last 72 hours. Hgb A1c: Recent Labs    06/11/21 1444  HGBA1C 8.1*   Lipid Profile: No results for input(s): CHOL, HDL, LDLCALC, TRIG, CHOLHDL, LDLDIRECT in the last 72 hours. Thyroid function studies: Recent Labs    06/11/21 1444  TSH 0.500   Anemia work up: No results for input(s): VITAMINB12, FOLATE, FERRITIN, TIBC, IRON, RETICCTPCT in the last 72 hours. Sepsis Labs: Recent Labs  Lab 06/11/21 1400 06/11/21 1444 06/11/21 1640 06/12/21 0525  PROCALCITON <0.10  --   --  <0.10  WBC 4.3  --   --  4.7  LATICACIDVEN  --  2.9* 1.3  --     Microbiology Recent Results (from the past 240 hour(s))  Resp Panel by RT-PCR (Flu A&B, Covid) Nasopharyngeal Swab     Status: None   Collection Time: 06/11/21  1:53 PM   Specimen: Nasopharyngeal Swab; Nasopharyngeal(NP) swabs in vial transport medium  Result Value Ref Range Status   SARS Coronavirus 2 by RT PCR NEGATIVE NEGATIVE Final    Comment:  (NOTE) SARS-CoV-2 target nucleic acids are NOT DETECTED.  The SARS-CoV-2 RNA is generally detectable in upper respiratory specimens during the acute phase of infection. The lowest concentration of SARS-CoV-2 viral copies this assay can detect is 138 copies/mL. A negative result does not preclude SARS-Cov-2 infection and should not be used as the sole basis for treatment or other patient management decisions. A negative result may occur with  improper specimen collection/handling, submission of specimen other than nasopharyngeal swab, presence of viral mutation(s) within the areas targeted by this assay, and inadequate number of viral copies(<138 copies/mL). A  negative result must be combined with clinical observations, patient history, and epidemiological information. The expected result is Negative.  Fact Sheet for Patients:  EntrepreneurPulse.com.au  Fact Sheet for Healthcare Providers:  IncredibleEmployment.be  This test is no t yet approved or cleared by the [place] FDA and  has been authorized for detection and/or diagnosis of SARS-CoV-2 by FDA under an Emergency Use Authorization (EUA). This EUA will remain  in effect (meaning this test can be used) for the duration of the COVID-19 declaration under Section 564(b)(1) of the Act, 21 U.S.C.section 360bbb-3(b)(1), unless the authorization is terminated  or revoked sooner.       Influenza A by PCR NEGATIVE NEGATIVE Final   Influenza B by PCR NEGATIVE NEGATIVE Final    Comment: (NOTE) The Xpert Xpress SARS-CoV-2/FLU/RSV plus assay is intended as an aid in the diagnosis of influenza from Nasopharyngeal swab specimens and should not be used as a sole basis for treatment. Nasal washings and aspirates are unacceptable for Xpert Xpress SARS-CoV-2/FLU/RSV testing.  Fact Sheet for Patients: EntrepreneurPulse.com.au  Fact Sheet for Healthcare  Providers: IncredibleEmployment.be  This test is not yet approved or cleared by the [place] FDA and has been authorized for detection and/or diagnosis of SARS-CoV-2 by FDA under an Emergency Use Authorization (EUA). This EUA will remain in effect (meaning this test can be used) for the duration of the COVID-19 declaration under Section 564(b)(1) of the Act, 21 U.S.C. section 360bbb-3(b)(1), unless the authorization is terminated or revoked.  Performed at [facility], [address], [place], [place] [address]   Blood Culture (routine x 2)     Status: None (Preliminary result)   Collection Time: 06/11/21  1:58 PM   Specimen: BLOOD LEFT HAND  Result Value Ref Range Status   Specimen Description   Final    BLOOD LEFT HAND BOTTLES DRAWN AEROBIC AND ANAEROBIC   Special Requests Blood Culture adequate volume  Final   Culture   Final    NO GROWTH < 12 HOURS Performed at [facility], [address], [place], [place] [address]    Report Status PENDING  Incomplete  Blood Culture (routine x 2)     Status: None (Preliminary result)   Collection Time: 06/11/21  2:44 PM   Specimen: BLOOD RIGHT HAND  Result Value Ref Range Status   Specimen Description   Final    BLOOD RIGHT HAND BOTTLES DRAWN AEROBIC AND ANAEROBIC   Special Requests Blood Culture adequate volume  Final   Culture   Final    NO GROWTH < 12 HOURS Performed at [facility], [address] [place], [place] [address]    Report Status PENDING  Incomplete    Procedures and diagnostic studies:  DG Chest Port 1 View  Result Date: 06/11/2021 CLINICAL DATA:  Questionable sepsis - evaluate for abnormality EXAM: PORTABLE CHEST 1 VIEW COMPARISON:  Radiograph 01/16/2021, chest CT 09/07/2020 FINDINGS: Unchanged cardiomediastinal silhouette. No new focal airspace disease. No pleural effusion. No pneumothorax. No acute osseous abnormality. IMPRESSION: No evidence of acute cardiopulmonary disease. Electronically  Signed   By: Maurine Simmering M.D.   On: 06/11/2021 14:17    Medications:    apixaban  5 mg Oral BID   aspirin EC  81 mg Oral Daily   atorvastatin  80 mg Oral QPM   carvedilol  6.25 mg Oral BID WC   colesevelam  1,875 mg Oral BID WC   diclofenac Sodium  2 g Topical QID   furosemide  40 mg  Oral Daily   hydrALAZINE  25 mg Oral Q8H   insulin aspart  0-5 Units Subcutaneous QHS   insulin aspart  0-9 Units Subcutaneous TID WC   insulin aspart  4 Units Subcutaneous TID WC   insulin glargine-yfgn  30 Units Subcutaneous QHS   lisinopril  20 mg Oral Daily   pantoprazole  40 mg Oral BID   polyethylene glycol  17 g Oral Daily   Continuous Infusions:  ceFEPime (MAXIPIME) IV 2 g (06/12/21 0928)   metronidazole 500 mg (06/12/21 0200)   vancomycin       LOS: 1 day   Geradine Girt  [facility]   How to contact the [facility] Attending or Consulting provider [place] or covering provider during after hours [place], for this patient?  Check the care team in [facility] and look for a) attending/consulting [facility] provider listed and b) the [facility] team listed Log into [email or web address] and use [place]'s universal password to access. If you do not have the password, please contact the hospital operator. Locate the [facility] provider you are looking for under [facility] and page to a number that you can be directly reached. If you still have difficulty reaching the provider, please page the [facility] (Director on Call) for the Hospitalists listed on amion for assistance.  06/12/2021, 12:08 PM

## 2021-06-12 NOTE — Progress Notes (Signed)
Hypoglycemic Event  CBG: 63 At 0835  Treatment: 4 oz juice/soda  Symptoms:  nauseated and dizzy  Follow-up CBG: Time:72 CBG Result:0852  Possible Reasons for Event: Unknown  Comments/MD notified:patient has started to feel better and Dr. Eliseo Squires has been made aware of episode and treatment.

## 2021-06-12 NOTE — Progress Notes (Signed)
Hypoglycemic Event  CBG: 58 at 1646  Treatment: 4 oz juice/soda  Symptoms: None  Follow-up CBG: Time:1701  CBG Result:66   Possible Reasons for Event: insulin coverage for lunch  Comments/MD notified:Dr. Eliseo Squires notified. Insulin adjustments made.     Recheck at 1720 after eating supper also : 104.

## 2021-06-13 ENCOUNTER — Other Ambulatory Visit: Payer: Self-pay | Admitting: *Deleted

## 2021-06-13 ENCOUNTER — Inpatient Hospital Stay (HOSPITAL_COMMUNITY): Payer: Medicare HMO

## 2021-06-13 ENCOUNTER — Encounter (HOSPITAL_COMMUNITY): Payer: Self-pay | Admitting: Family Medicine

## 2021-06-13 DIAGNOSIS — I2510 Atherosclerotic heart disease of native coronary artery without angina pectoris: Secondary | ICD-10-CM

## 2021-06-13 DIAGNOSIS — R6520 Severe sepsis without septic shock: Secondary | ICD-10-CM

## 2021-06-13 DIAGNOSIS — A419 Sepsis, unspecified organism: Secondary | ICD-10-CM

## 2021-06-13 DIAGNOSIS — R9431 Abnormal electrocardiogram [ECG] [EKG]: Secondary | ICD-10-CM

## 2021-06-13 LAB — BASIC METABOLIC PANEL
Anion gap: 5 (ref 5–15)
BUN: 23 mg/dL (ref 8–23)
CO2: 27 mmol/L (ref 22–32)
Calcium: 9.1 mg/dL (ref 8.9–10.3)
Chloride: 108 mmol/L (ref 98–111)
Creatinine, Ser: 1.4 mg/dL — ABNORMAL HIGH (ref 0.44–1.00)
GFR, Estimated: 39 mL/min — ABNORMAL LOW (ref >60)
Glucose, Bld: 74 mg/dL (ref 70–99)
Potassium: 3.9 mmol/L (ref 3.5–5.1)
Sodium: 140 mmol/L (ref 135–145)

## 2021-06-13 LAB — ECHOCARDIOGRAM COMPLETE
AV Mean grad: 5 mmHg
AV Peak grad: 8.1 mmHg
Ao pk vel: 1.42 m/s
Area-P 1/2: 2.9 cm2
Height: 66 in
S' Lateral: 3.4 cm
Single Plane A4C EF: 67.6 %
Weight: 2768 oz

## 2021-06-13 LAB — URINE CULTURE: Culture: NO GROWTH

## 2021-06-13 LAB — GLUCOSE, CAPILLARY
Glucose-Capillary: 90 mg/dL (ref 70–99)
Glucose-Capillary: 75 mg/dL (ref 70–99)
Glucose-Capillary: 72 mg/dL (ref 70–99)
Glucose-Capillary: 76 mg/dL (ref 70–99)
Glucose-Capillary: 98 mg/dL (ref 70–99)
Glucose-Capillary: 211 mg/dL — ABNORMAL HIGH (ref 70–99)
Glucose-Capillary: 221 mg/dL — ABNORMAL HIGH (ref 70–99)
Glucose-Capillary: 179 mg/dL — ABNORMAL HIGH (ref 70–99)

## 2021-06-13 LAB — CBC
HCT: 28.1 % — ABNORMAL LOW (ref 36.0–46.0)
Hemoglobin: 9.7 g/dL — ABNORMAL LOW (ref 12.0–15.0)
MCH: 28.1 pg (ref 26.0–34.0)
MCHC: 34.5 g/dL (ref 30.0–36.0)
MCV: 81.4 fL (ref 80.0–100.0)
Platelets: 133 10*3/uL — ABNORMAL LOW (ref 150–400)
RBC: 3.45 MIL/uL — ABNORMAL LOW (ref 3.87–5.11)
RDW: 15.2 % (ref 11.5–15.5)
WBC: 4 10*3/uL (ref 4.0–10.5)
nRBC: 0 % (ref 0.0–0.2)

## 2021-06-13 LAB — PROCALCITONIN: Procalcitonin: <0.1 ng/mL

## 2021-06-13 LAB — TROPONIN I (HIGH SENSITIVITY)
Troponin I (High Sensitivity): 29 ng/L — ABNORMAL HIGH (ref <18)
Troponin I (High Sensitivity): 23 ng/L — ABNORMAL HIGH (ref <18)

## 2021-06-13 MED ORDER — AMLODIPINE BESYLATE 5 MG PO TABS
5.0000 mg | ORAL_TABLET | Freq: Every day | ORAL | Status: DC
  Filled 2021-06-13: qty 1

## 2021-06-13 MED ORDER — INSULIN ASPART 100 UNIT/ML IJ SOLN: 0.0000 [IU] | Freq: Every day | INTRAMUSCULAR | Status: DC

## 2021-06-13 MED ORDER — HYDRALAZINE HCL 25 MG PO TABS
25.0000 mg | ORAL_TABLET | Freq: Once | ORAL | Status: DC
  Filled 2021-06-13: qty 1

## 2021-06-13 MED ORDER — INSULIN ASPART 100 UNIT/ML IJ SOLN
0.0000 [IU] | Freq: Three times a day (TID) | INTRAMUSCULAR | Status: DC
  Administered 2021-06-13: 17:00:00 5 [IU] via SUBCUTANEOUS
  Administered 2021-06-14: 12:00:00 8 [IU] via SUBCUTANEOUS
  Administered 2021-06-14: 17:00:00 5 [IU] via SUBCUTANEOUS
  Administered 2021-06-14: 08:00:00 3 [IU] via SUBCUTANEOUS
  Administered 2021-06-15: 13:00:00 15 [IU] via SUBCUTANEOUS
  Administered 2021-06-15: 09:00:00 3 [IU] via SUBCUTANEOUS

## 2021-06-13 MED ORDER — DEXTROSE-NACL 5-0.9 % IV SOLN: INTRAVENOUS | Status: DC

## 2021-06-13 MED ORDER — HYDRALAZINE HCL 25 MG PO TABS
50.0000 mg | ORAL_TABLET | Freq: Three times a day (TID) | ORAL | Status: DC
  Administered 2021-06-13 – 2021-06-15 (×6): 50 mg via ORAL
  Filled 2021-06-13 (×6): qty 2

## 2021-06-13 NOTE — Plan of Care (Signed)
°  Problem: Acute Rehab PT Goals(only PT should resolve) Goal: Pt Will Go Supine/Side To Sit Outcome: Progressing Flowsheets (Taken 06/13/2021 1436) Pt will go Supine/Side to Sit: Independently Goal: Patient Will Transfer Sit To/From Stand Outcome: Progressing Flowsheets (Taken 06/13/2021 1436) Patient will transfer sit to/from stand:  with modified independence  Independently Goal: Pt Will Transfer Bed To Chair/Chair To Bed Outcome: Progressing Flowsheets (Taken 06/13/2021 1436) Pt will Transfer Bed to Chair/Chair to Bed:  Independently  with modified independence Goal: Pt Will Ambulate Outcome: Progressing Flowsheets (Taken 06/13/2021 1436) Pt will Ambulate:  > 125 feet  with modified independence  with rolling walker  with cane   2:36 PM, 06/13/21 Lonell Grandchild, MPT Physical Therapist with [facility] [phone] office [phone] mobile phone

## 2021-06-13 NOTE — Progress Notes (Addendum)
Inpatient Diabetes Program Recommendations  AACE/ADA: New Consensus Statement on Inpatient Glycemic Control (2015)  Target Ranges:  Prepandial:   less than 140 mg/dL      Peak postprandial:   less than 180 mg/dL (1-2 hours)      Critically ill patients:  140 - 180 mg/dL   Lab Results  Component Value Date   GLUCAP 211 (H) 06/13/2021   HGBA1C 8.1 (H) 06/11/2021    Review of Glycemic Control  Diabetes history: DM2 Outpatient Diabetes medications: lantus 32 units QHS, Novolog 6-9 units TID Current orders for Inpatient glycemic control: 0-9 units TID and 0-5 units QHS (basal has been discontinued due to hypoglycemia)  Inpatient diabetes recommendations:  Discontinue home dose of glipizide at [facility] with patient over the phone.  Reviewed patient's current A1c of 8.1%. Explained what a A1c is and what it measures. Also reviewed goal A1c with patient, importance of good glucose control @ home, and blood sugar goals. Given her age this is an appropriate A1C.    Patient lives alone.  She states she has several episodes of hypoglycemia at home.  She feels when her blood sugar is low but states it is usually too late.  It is often in the 40's and 50's.  She is aware how to treat.   She is current with Dr. Dorris Fetch, endocrinology.  She has an appointment with him on 06/22/21 at 3pm.  She states she checks her CBG's 4 times daily.    Discussed using a [employer] CGM.  Unsure if she would be able to afford this but we could place one on her prior to DC and send her with 1 extra which would be a months supply.  She would need a prior authorization from PCP after the samples.    Will continue to follow while inpatient.  Thank you, Reche Dixon, RN, BSN Diabetes Coordinator Inpatient Diabetes Program [phone] (team pager from 8a-5p)

## 2021-06-13 NOTE — Consult Note (Signed)
Cardiology Consultation:   Patient ID: Cassandra Adams MRN: [id number]; DOB: November 29, 1945  Admit date: 06/11/2021 Date of Consult: 06/13/2021  PCP:  Sharilyn Sites, MD   [facility] Providers Cardiologist:  [name], MD   {    Patient Profile:   Cassandra Adams is a 75 y.o. female with a hx of CAD s/p NSTEMI in 7/15 (CTO of the RCA, moderate LAD disease-medical therapy), HFpEF, paroxysmal atrial fibrillation, prior TIA, CKD, hypertension, hyperlipidemia, diabetes mellitus, prior upper GI bleed who is being seen 06/13/2021 for the evaluation of bradycardia at the request of Dr. Eliseo Squires.  History of Present Illness:   Cassandra Adams was last seen in clinic by Almyra Deforest, PA-C in 9/22.  She has been managed medically since 2015 when she presented with a non-STEMI.  Her RCA was occluded and she had moderate disease in the LAD.  EF has been preserved.  She has been managed with apixaban for anticoagulation.  She had a syncopal spell in May 2022 and suffered a nasal injury with epistaxis.  She was found to be in atrial fibrillation with rapid rate at that time.  She was readmitted with coffee-ground emesis and found to have erosive gastropathy on EGD with no active bleeding.  She was placed on PPI.  She ultimately did undergo cardioversion in July 2022.  She was admitted shortly after this with volume excess.  She remained in sinus rhythm.  She was still in sinus rhythm when she saw Isaac Laud in September.  She was admitted 06/11/2021 with an episode of confusion.  Notes indicate she had difficulty getting up from the toilet after going to the bathroom.  She felt weak, cold, confused, shaky.  She was given juice and food and sugar when EMS arrived was 114.  She was noted to be bradycardic with heart rate in the 30s.  She was initially hypothermic in the ED and placed on [facility].  She was initially started on IV antibiotics due to concerns for sepsis. However this has been ruled out with a  normal pro calcitonin, normal WBC and no fever.  Beta-blocker has been held.  Data K 3.9, Cr 1.40, ALT 24 Hgb 12.2 >> 9.4 >> 9.7 BNP 275 TSH 0.500 Flu A, Flu B, SARS-CoV-2: neg Hemoccult: neg x 1 Bld Cx: NG x 2 days Urine Cx: NG CXR: no acute disease   EKG #1 06/11/2021: NSR, HR 69, PVCs (ventricular bigeminy), QTC 414 EKG #08/07/1720: NSR, HR 63, subtle inferolateral T wave inversions, QTC 435  Tele: Sinus brady, HR 40s-50s  She has no chest pain, palpitations or dyspnea She only complains of having a headache currently   Past Medical History:  Diagnosis Date   (HFpEF) heart failure with preserved ejection fraction ([place])    Limited Echo 7/22: Mild asymmetrical LVH due to scar and thinning of basal posterior wall and background conc LVH, apical and mid segments hyperdynamic, EF 60-65, basal inf-lat and basal inf AK, normal RVSF, RVSP 26.6, mild LAE, mild-moderate MR, mild AV sclerosis w/o AS // Echo 5/22: EF 55-60, RVSP 27, mild to mod LAE, poss small to mod eff; AV sclerosis no AS   Arthritis    "all over"   CAD (coronary artery disease)    a. NSTEMI 12/2013 - occluded dominant RCA with L-R collaterals, moderate prox segmental LAD disease, for medical therapy initially, EF 60% with subtle inferobasal hypokinesia. Consider PCI for refractory CP.   CKD (chronic kidney disease), stage III ([place])  Hypertension    Migraines    "weekly" (01/06/2014)   Sickle cell trait ([place])    TIA (transient ischemic attack) 1978   Type II diabetes mellitus ([place])    Vertigo     Past Surgical History:  Procedure Laterality Date   APPENDECTOMY  March 2014   had appendix frozen   CARDIAC CATHETERIZATION  "years ago" & 01/05/2014   CARDIOVERSION N/A 03/17/2014   Procedure: CARDIOVERSION;  Surgeon: [name], MD;  Location: [place];  Service: Cardiovascular;  Laterality: N/A;   CARDIOVERSION N/A 01/14/2021   Procedure: CARDIOVERSION;  Surgeon: [name], MD;  Location: [facility] ENDOSCOPY;   Service: Cardiovascular;  Laterality: N/A;   CATARACT EXTRACTION W/ INTRAOCULAR LENS  IMPLANT, BILATERAL Bilateral 04/2013-05/2013   ENDARTERECTOMY Right 02/05/2017   Procedure: ENDARTERECTOMY CAROTID-RIGHT;  Surgeon: [name], MD;  Location: [place];  Service: Vascular;  Laterality: Right;   ESOPHAGOGASTRODUODENOSCOPY  11/08/2007    Normal esophagus without evidence of Barrett, mass, erosion/ Normal stomach, duodenal bulb   ESOPHAGOGASTRODUODENOSCOPY (EGD) WITH PROPOFOL N/A 11/08/2020   Procedure: ESOPHAGOGASTRODUODENOSCOPY (EGD) WITH PROPOFOL;  Surgeon: Thornton Park, MD;  Location: [place];  Service: Gastroenterology;  Laterality: N/A;   GASTRIC MOTILITY STUDY  11/13/2007   mildly delayed emptying subjectively, but normal  analysis-77% of tracer emptied at 2 hours   JOINT REPLACEMENT     LAPAROSCOPIC APPENDECTOMY N/A 09/15/2012   Procedure: APPENDECTOMY LAPAROSCOPIC;  Surgeon: [name], MD;  Location: [facility];  Service: General;  Laterality: N/A;   LEFT HEART CATHETERIZATION WITH CORONARY ANGIOGRAM N/A 01/05/2014   Procedure: LEFT HEART CATHETERIZATION WITH CORONARY ANGIOGRAM;  Surgeon: [name], MD;  Location[facility] C[facility] LAB;  Service: Cardiovascular;  Laterality: N/A;   REPLACEMENT TOTAL KNEE Right 05-03-06   TEE WITHOUT CARDIOVERSION N/A 03/17/2014   Procedure: TRANSESOPHAGEAL ECHOCARDIOGRAM (TEE);  Surgeon: [name], MD;  Location: [facility] ENDOSCOPY;  Service: Cardiovascular;  Laterality: N/A;   TONSILLECTOMY  1972     Home Medications:  Prior to Admission medications   Medication Sig Start Date End Date Taking? Authorizing Provider  aspirin EC 81 MG tablet Take 1 tablet (81 mg total) by mouth daily. 11/08/20  Yes Johnson, Clanford L, MD  atorvastatin (LIPITOR) 80 MG tablet Take 1 tablet (80 mg total) by mouth every evening. 12/21/20  Yes Hilty, Nadean Corwin, MD  carvedilol (COREG) 12.5 MG tablet Take 0.5 tablets (6.25 mg total) by mouth 2 (two) times daily with a  meal. 01/14/21 06/11/21 Yes Hilty, Nadean Corwin, MD  colesevelam (WELCHOL) 625 MG tablet Take 1,875 mg by mouth 2 (two) times daily with a meal. Noon and night   Yes [provider]  ELIQUIS 5 MG TABS tablet Take 1 tablet (5 mg total) by mouth 2 (two) times daily. 11/07/20  Yes Johnson, Clanford L, MD  furosemide (LASIX) 40 MG tablet Take 40 mg  one tablet by mouth daily , may take an additional 40 mg if needed daily if leg edema worsens 01/10/21  Yes Almyra Deforest, PA  glipiZIDE (GLUCOTROL XL) 2.5 MG 24 hr tablet TAKE 1 TABLET (2.5 MG TOTAL) BY MOUTH DAILY WITH BREAKFAST. Patient taking differently: Take 2.5 mg by mouth daily with breakfast. 04/25/21  Yes Nida, Marella Chimes, MD  hydrALAZINE (APRESOLINE) 50 MG tablet Take 1 tablet (50 mg total) by mouth 3 (three) times daily. Patient taking differently: Take 25 mg by mouth 3 (three) times daily. 03/15/21  Yes Eulas Post, Isaac Laud, PA  Insulin Glargine [facility])  100 UNIT/ML INJECT 32 UNITS INTO THE SKIN AT BEDTIME. 04/19/21  Yes Reardon, Loree Fee J, NP  lisinopril (ZESTRIL) 20 MG tablet Take 1 tablet (20 mg total) by mouth daily. 12/21/20  Yes Hilty, Nadean Corwin, MD  nitroGLYCERIN (NITROSTAT) 0.4 MG SL tablet PLACE 1 TABLET UNDER THE TONGUE EVERY 5 MINUTES AS NEEDED FOR CHEST PAIN (UP TO 3 DOSES) Patient taking differently: Place 0.4 mg under the tongue every 5 (five) minutes as needed. 03/25/21  Yes Hilty, Nadean Corwin, MD  NOVOLOG 100 UNIT/ML injection Inject 6-9 Units into the skin 3 (three) times daily with meals. Sliding Scale 07/28/20  Yes [provider]  pantoprazole (PROTONIX) 40 MG tablet Take 1 tablet (40 mg total) by mouth 2 (two) times daily. 11/11/20 06/11/21 Yes Dessa Phi, DO  polyethylene glycol (MIRALAX / GLYCOLAX) 17 g packet Take 17 g by mouth daily. 11/12/20  Yes Dessa Phi, DO  senna-docusate (SENOKOT-S) 8.6-50 MG tablet Take 1 tablet by mouth at bedtime as needed for mild constipation. 11/11/20  Yes Dessa Phi, DO   Vitamin D, Cholecalciferol, 25 MCG (1000 UT) TABS Take 2,000 Units by mouth every other day.   Yes [provider]  BD INSULIN SYRINGE U/F 31G X 5/16" 0.5 ML MISC Use to take with Novolog [facility] 01/07/21   Cassandria Anger, MD    Inpatient Medications: Scheduled Meds:  apixaban  5 mg Oral BID   aspirin EC  81 mg Oral Daily   atorvastatin  80 mg Oral QPM   colesevelam  1,875 mg Oral BID WC   diclofenac Sodium  2 g Topical QID   hydrALAZINE  50 mg Oral Q8H   insulin aspart  0-5 Units Subcutaneous QHS   insulin aspart  0-9 Units Subcutaneous TID WC   lisinopril  20 mg Oral Daily   pantoprazole  40 mg Oral BID   polyethylene glycol  17 g Oral Daily   Continuous Infusions:  PRN Meds: acetaminophen **OR** acetaminophen, ondansetron **OR** ondansetron (ZOFRAN) IV  Allergies:    Allergies  Allergen Reactions   Penicillins Hives    Has patient had a PCN reaction causing immediate rash, facial/tongue/throat swelling, SOB or lightheadedness with hypotension: no Has patient had a PCN reaction causing severe rash involving mucus membranes or skin necrosis: No  Has patient had a PCN reaction that required hospitalization: no Has patient had a PCN reaction occurring within the last 10 years: no If all of the above answers are "NO", then may proceed with Cephalosporin use.     Social History:   Social History   Socioeconomic History   Marital status: Single    Spouse name: Not on file   Number of children: Not on file   Years of education: Not on file   Highest education level: Not on file  Occupational History   Not on file  Tobacco Use   Smoking status: Never   Smokeless tobacco: Never  Vaping Use   Vaping Use: Never used  Substance and Sexual Activity   Alcohol use: No   Drug use: No   Sexual activity: Not Currently    Birth control/protection: Post-menopausal  Other Topics Concern   Not on file  Social History Narrative   Not on file   Social  Determinants of Health   Financial Resource Strain: Not on file  Food Insecurity: Not on file  Transportation Needs: Not on file  Physical Activity: Not on file  Stress: Not on file  Social Connections: Not on  file  Intimate Partner Violence: Not on file    Family History:    Family History  Problem Relation Age of Onset   Hyperlipidemia Mother    Hypertension Mother    Heart attack Mother    Cancer Father        lung   Hypertension Sister    Diabetes Brother    Heart disease Brother    Hyperlipidemia Brother    Hypertension Brother    Heart attack Brother    Other Brother        DVT     ROS:  Please see the history of present illness.  All other ROS reviewed and negative.     Physical Exam/Data:   Vitals:   06/12/21 1711 06/12/21 2111 06/13/21 0428 06/13/21 0906  BP:  (!) 155/60 (!) 173/61 (!) 145/56  Pulse: (!) 56 (!) 57 (!) 50   Resp:  15 17   Temp:  98.8 F (37.1 C) (!) 97.4 F (36.3 C)   TempSrc:  Oral Oral   SpO2:  99% 100%   Weight:      Height:        Intake/Output Summary (Last 24 hours) at 06/13/2021 1238 Last data filed at 06/13/2021 0900 Gross per 24 hour  Intake 820 ml  Output --  Net 820 ml   Last 3 Weights 06/11/2021 03/15/2021 02/16/2021  Weight (lbs) 173 lb 165 lb 9.6 oz 176 lb  Weight (kg) 78.472 kg 75.116 kg 79.833 kg     Body mass index is 27.92 kg/m.  Affect appropriate Healthy:  appears stated age 60: normal Neck supple with no adenopathy JVP normal no bruits no thyromegaly Lungs clear with no wheezing and good diaphragmatic motion Heart:  S1/S2 no murmur, no rub, gallop or click PMI normal Abdomen: benighn, BS positve, no tenderness, no AAA no bruit.  No HSM or HJR Distal pulses intact with no bruits No edema Neuro non-focal Skin warm and dry No muscular weakness   EKG:   SB no AV block no acute ST changes  Telemetry:   SB rates in 50's   Laboratory Data:  High Sensitivity Troponin:  No results for input(s):  TROPONINIHS in the last 720 hours.   Chemistry Recent Labs  Lab 06/11/21 1400 06/12/21 0525 06/13/21 0402  NA 134* 140 140  K 3.9 4.1 3.9  CL 99 109 108  CO2 27 26 27   GLUCOSE 207* 163* 74  BUN 27* 25* 23  CREATININE 1.33* 1.20* 1.40*  CALCIUM 10.6* 9.1 9.1  GFRNONAA 41* 47* 39*  ANIONGAP 8 5 5     Recent Labs  Lab 06/11/21 1400  PROT 8.7*  ALBUMIN 4.7  AST 39  ALT 24  ALKPHOS 75  BILITOT 0.5    Hematology Recent Labs  Lab 06/11/21 1400 06/12/21 0525 06/13/21 0402  WBC 4.3 4.7 4.0  RBC 4.25 3.28* 3.45*  HGB 12.2 9.4* 9.7*  HCT 34.6* 26.7* 28.1*  MCV 81.4 81.4 81.4  MCH 28.7 28.7 28.1  MCHC 35.3 35.2 34.5  RDW 15.3 15.0 15.2  PLT 167 126* 133*   Thyroid  Recent Labs  Lab 06/11/21 1444  TSH 0.500    BNP Recent Labs  Lab 06/11/21 1400  BNP 275.0*    DDimer No results for input(s): DDIMER in the last 168 hours.   Radiology/Studies:  DG Chest Port 1 View  Result Date: 06/11/2021 CLINICAL DATA:  Questionable sepsis - evaluate for abnormality EXAM: PORTABLE CHEST 1 VIEW COMPARISON:  Radiograph  01/16/2021, chest CT 09/07/2020 FINDINGS: Unchanged cardiomediastinal silhouette. No new focal airspace disease. No pleural effusion. No pneumothorax. No acute osseous abnormality. IMPRESSION: No evidence of acute cardiopulmonary disease. Electronically Signed   By: Maurine Simmering M.D.   On: 06/11/2021 14:17     Assessment and Plan:   1.  Sinus bradycardia She has SSS with PAF post [facility]. She had her coreg d/c 12/17 There is no AV block and BBB I suspect coreg still in systoem as well No indication that bradycardia caused events She had some bigeminy on admission and likely had a pulse deficit on exam indicating lower pulse than she actually had. Consider outpatient 30 day event monitor he admission seems more related to vagal episode with dehydration and possible infection with elevated lactic acid   2.  Coronary artery disease  Hx of NSTEMI in 2015 with occluded RCA  and mod disease in the LAD.  She has been managed medically since then.  She has not had angina.  Given questionable event that prompted admission, will obtain echocardiogram and a set of hs-Troponins.  Continue statin Rx.  She is on chronic anticoagulation with Apixaban.  She has not had a recent PCI or event.  It may be reasonable to DC ASA.  She can discuss further with Dr. Debara Pickett and Almyra Deforest, PA-C at clinic f/u whether she needs long ASA Rx in addition to anticoagulation.    -Echocardiogram  -hsTroponin x 2  - consider outpatient myovue to further risk stratify   3. Paroxysmal atrial fibrillation  Maintaining normal sinus rhythm.  Continue Apixaban 5 mg twice daily.  4.  (HFpEF) heart failure with preserved ejection fraction  Volume status stable.  She was on furosemide 40 mg once daily prior to admission.  This should be resumed at DC.    5. Chronic kidney disease  Creatinine is stable.  6. Hypertension  BP elevated.  Now off Carvedilol.  Continue to monitor.  We may be able to resume carvedilol at lower dose prior to DC.  7.  Anemia Per primary service.      For questions or updates, please contact [place] Please consult [email or web address] for contact info under    Signed, Richardson Dopp, PA-C  06/13/2021 12:38 PM

## 2021-06-13 NOTE — Evaluation (Signed)
Physical Therapy Evaluation Patient Details Name: TAJUANNA BURNETT MRN: [id number] DOB: 10-17-1945 Today's Date: 06/13/2021  History of Present Illness  ABBIGAEL DETLEFSEN is a 75 y.o. female with a history of diabetes, coronary artery disease with history of NSTEMI in 2015, stage III chronic kidney disease, hypertension, history of TIAs, type 2 diabetes on insulin.  As the patient does not have recollection of the events, history is obtained by medical records, ER personnel, patient's daughter.  Up until the event, the patient had been feeling fine with no feelings of illness.  She had went to the bathroom and was having difficulty getting up from the toilet.  She felt weak, cold, confused, shaky.  One of her granddaughters came and checked on her, then called the patient's daughter.  The patient was acting like her blood sugar was low.  They tried to check her blood sugar several times, but was unable to draw any blood from her fingers due to her hands being very cold.  EMS was called.  While waiting for EMS, the patient had some juice and food.  By the time EMS checked her blood sugar, they noted that it was 114.  The patient was bradycardic with a heart rate in the 30s.  The patient was not experiencing any chest pain, shortness of breath.   Clinical Impression  Patient functioning near baseline for functional mobility and gait demonstrating good return for ambulation in room/hallway and transferring to/from commode in bathroom without problem.  Patient tolerated sitting up in chair after therapy and encouraged to ambulate with nursing staff in hallway as tolerated.  Patient will benefit from continued skilled physical therapy in hospital and recommended venue below to increase strength, balance, endurance for safe ADLs and gait.          Recommendations for follow up therapy are one component of a multi-disciplinary discharge planning process, led by the attending physician.  Recommendations  may be updated based on patient status, additional functional criteria and insurance authorization.  Follow Up Recommendations No PT follow up    Assistance Recommended at Discharge    Functional Status Assessment Patient has had a recent decline in their functional status and demonstrates the ability to make significant improvements in function in a reasonable and predictable amount of time.  Equipment Recommendations  None recommended by PT    Recommendations for Other Services       Precautions / Restrictions Precautions Precautions: Fall Restrictions Weight Bearing Restrictions: No      Mobility  Bed Mobility Overal bed mobility: Modified Independent                  Transfers Overall transfer level: Modified independent                      Ambulation/Gait Ambulation/Gait assistance: Supervision Gait Distance (Feet): 75 Feet Assistive device: Rolling walker (2 wheels) Gait Pattern/deviations: Decreased step length - left;Decreased stance time - right;Decreased stride length Gait velocity: decreased     General Gait Details: slow slightly labored cadence without loss of balance, limited due to fatigue  Stairs            Wheelchair Mobility    Modified Rankin (Stroke Patients Only)       Balance Overall balance assessment: Needs assistance Sitting-balance support: Feet supported;No upper extremity supported Sitting balance-Leahy Scale: Good Sitting balance - Comments: seated at EOB   Standing balance support: During functional activity;No upper extremity supported   Standing  balance comment: fair/poor without AD, fair/good using RW                             Pertinent Vitals/Pain Pain Assessment: 0-10 Pain Score: 2  Pain Location: numbness right hand Pain Descriptors / Indicators: Numbness Pain Intervention(s): Limited activity within patient's tolerance;Monitored during session    Home Living Family/patient  expects to be discharged to:: Private residence Living Arrangements: Alone Available Help at Discharge: Family;Available PRN/intermittently Type of Home: Apartment Home Access: Level entry       Home Layout: One level Home Equipment: Conservation officer, nature (2 wheels);Cane - single point;BSC/3in1;Shower seat Additional Comments: patient does not drive    Prior Function Prior Level of Function : Independent/Modified Independent             Mobility Comments: household and short distanced community ambulator using SPC or RW ADLs Comments: assisted by family for community ADLs     Hand Dominance   Dominant Hand: Right    Extremity/Trunk Assessment   Upper Extremity Assessment Upper Extremity Assessment: Overall WFL for tasks assessed    Lower Extremity Assessment Lower Extremity Assessment: Generalized weakness    Cervical / Trunk Assessment Cervical / Trunk Assessment: Normal  Communication   Communication: No difficulties  Cognition Arousal/Alertness: Awake/alert Behavior During Therapy: WFL for tasks assessed/performed Overall Cognitive Status: Within Functional Limits for tasks assessed                                          General Comments      Exercises     Assessment/Plan    PT Assessment Patient needs continued PT services  PT Problem List Decreased strength;Decreased activity tolerance;Decreased balance;Decreased mobility       PT Treatment Interventions DME instruction;Gait training;Stair training;Functional mobility training;Therapeutic activities;Therapeutic exercise;Balance training;Patient/family education    PT Goals (Current goals can be found in the Care Plan section)  Acute Rehab PT Goals Patient Stated Goal: return home with family to assist PT Goal Formulation: With patient Time For Goal Achievement: 06/16/21 Potential to Achieve Goals: Good    Frequency Min 3X/week   Barriers to discharge        Co-evaluation                AM-PAC PT "6 Clicks" Mobility  Outcome Measure Help needed turning from your back to your side while in a flat bed without using bedrails?: None Help needed moving from lying on your back to sitting on the side of a flat bed without using bedrails?: None   Help needed standing up from a chair using your arms (e.g., wheelchair or bedside chair)?: None Help needed to walk in hospital room?: A Little Help needed climbing 3-5 steps with a railing? : A Little 6 Click Score: 18    End of Session   Activity Tolerance: Patient tolerated treatment well;Patient limited by fatigue Patient left: in chair;with call bell/phone within reach Nurse Communication: Mobility status PT Visit Diagnosis: Unsteadiness on feet (R26.81);Other abnormalities of gait and mobility (R26.89);Muscle weakness (generalized) (M62.81)    Time: 9449-6759 PT Time Calculation (min) (ACUTE ONLY): 25 min   Charges:   PT Evaluation $PT Eval Moderate Complexity: 1 Mod PT Treatments $Therapeutic Activity: 23-37 mins        2:34 PM, 06/13/21 Lonell Grandchild, MPT Physical Therapist with [place]  [place] office [phone] mobile phone

## 2021-06-13 NOTE — Progress Notes (Incomplete)
*  PRELIMINARY RESULTS* Echocardiogram 2D Echocardiogram has been performed.  Cassandra Adams 06/13/2021, 3:50 PM

## 2021-06-13 NOTE — Progress Notes (Signed)
Progress Note    Cassandra Adams  LTJ:[id number] DOB: 07/05/1945  DOA: 06/11/2021 PCP: Sharilyn Sites, MD    Brief Narrative:     Medical records reviewed and are as summarized below:  Cassandra Adams is an 75 y.o. female with a history of diabetes, coronary artery disease with history of NSTEMI in 2015, stage III chronic kidney disease, hypertension, history of TIAs, type 2 diabetes on insulin.  As the patient does not have recollection of the events, history is obtained by medical records, ER personnel, patient's daughter.  Up until the event, the patient had been feeling fine with no feelings of illness.  She had went to the bathroom and was having difficulty getting up from the toilet.  She felt weak, cold, confused, shaky.  One of her granddaughters came and checked on her, then called the patient's daughter.  The patient was acting like her blood sugar was low.  They tried to check her blood sugar several times, but was unable to draw any blood from her fingers due to her hands being very cold.  EMS was called.  While waiting for EMS, the patient had some juice and food.  By the time EMS checked her blood sugar, they noted that it was 114.  The patient was bradycardic with a heart rate in the 30s.    Assessment/Plan:   Principal Problem:   Sepsis ([place]) Active Problems:   Type 2 diabetes mellitus with diabetic neuropathy (HCC)   Essential hypertension, benign   GERD   Bradycardia   Carotid stenosis   CKD stage 3 due to type 2 diabetes mellitus (HCC)   Atrial fibrillation, chronic (HCC)   Hypoglycemia -takes SSI and 32 units at home (was given 40 units initially with subsequent low blood sugar) -hold long acting as had another episode of hypoglycemia yesterday -monitor sugars closely (got 25 units on 12/18)  Not sepsis -afebrile/no WBC -no source -procalcitonin negative  Bradycardia -hold BB for now (last dose 12/17 PM) -? Vasovagal -will ask cards to see  as continues to have bradycardia  Right shoulder pain -known arthritis -voltaren gel with improvement per patient  Atrial fibrillation on anticoagulation -Rate controlled: HR <60- coreg held Continue Eliquis  Type 2 diabetes with possible hypoglycemic episode -see above  Hypertension -Continue antihypertensives minus BB -increase hydralazine  GERD -PPI  Stage IIIa chronic kidney disease Stable  Anemia ? CKD -Hgb stable -monitor for bleeding  Hypercalcemia -suspect due to dehydration -resolved    Family Communication/Anticipated D/C date and plan/Code Status   DVT prophylaxis: eliquis Code Status: Full Code.  Disposition Plan: Status is: Inpatient  Remains inpatient appropriate because: need further evaluation of bradycardia and hypoglycemia         Medical Consultants:   cards    Subjective:   Multiple complaints: right shoulder pain, dizziness, some nausea  Objective:    Vitals:   06/12/21 1711 06/12/21 2111 06/13/21 0428 06/13/21 0906  BP:  (!) 155/60 (!) 173/61 (!) 145/56  Pulse: (!) 56 (!) 57 (!) 50   Resp:  15 17   Temp:  98.8 F (37.1 C) (!) 97.4 F (36.3 C)   TempSrc:  Oral Oral   SpO2:  99% 100%   Weight:      Height:        Intake/Output Summary (Last 24 hours) at 06/13/2021 1127 Last data filed at 06/13/2021 0900 Gross per 24 hour  Intake 820 ml  Output --  Net 820 ml  Filed Weights   06/11/21 1329  Weight: 78.5 kg    Exam:  General: Appearance:     Overweight female in no acute distress- sitting in chair     Lungs:     respirations unlabored  Heart:    Bradycardic.   MS:   All extremities are intact.    Neurologic:   Awake, alert, pleasant and cooperative       Data Reviewed:   I have personally reviewed following labs and imaging studies:  Labs: Labs show the following:   Basic Metabolic Panel: Recent Labs  Lab 06/11/21 1400 06/12/21 0525 06/13/21 0402  NA 134* 140 140  K 3.9 4.1 3.9  CL 99  109 108  CO2 27 26 27   GLUCOSE 207* 163* 74  BUN 27* 25* 23  CREATININE 1.33* 1.20* 1.40*  CALCIUM 10.6* 9.1 9.1   GFR Estimated Creatinine Clearance: 36.2 mL/min (A) (by C-G formula based on SCr of 1.4 mg/dL (H)). Liver Function Tests: Recent Labs  Lab 06/11/21 1400  AST 39  ALT 24  ALKPHOS 75  BILITOT 0.5  PROT 8.7*  ALBUMIN 4.7   No results for input(s): LIPASE, AMYLASE in the last 168 hours. No results for input(s): AMMONIA in the last 168 hours. Coagulation profile Recent Labs  Lab 06/11/21 1400  INR 1.2    CBC: Recent Labs  Lab 06/11/21 1400 06/12/21 0525 06/13/21 0402  WBC 4.3 4.7 4.0  NEUTROABS 2.9  --   --   HGB 12.2 9.4* 9.7*  HCT 34.6* 26.7* 28.1*  MCV 81.4 81.4 81.4  PLT 167 126* 133*   Cardiac Enzymes: No results for input(s): CKTOTAL, CKMB, CKMBINDEX, TROPONINI in the last 168 hours. BNP (last 3 results) No results for input(s): PROBNP in the last 8760 hours. CBG: Recent Labs  Lab 06/13/21 0322 06/13/21 0422 06/13/21 0517 06/13/21 0603 06/13/21 0745  GLUCAP 90 75 72 76 98   D-Dimer: No results for input(s): DDIMER in the last 72 hours. Hgb A1c: Recent Labs    06/11/21 1444  HGBA1C 8.1*   Lipid Profile: No results for input(s): CHOL, HDL, LDLCALC, TRIG, CHOLHDL, LDLDIRECT in the last 72 hours. Thyroid function studies: Recent Labs    06/11/21 1444  TSH 0.500   Anemia work up: No results for input(s): VITAMINB12, FOLATE, FERRITIN, TIBC, IRON, RETICCTPCT in the last 72 hours. Sepsis Labs: Recent Labs  Lab 06/11/21 1400 06/11/21 1444 06/11/21 1640 06/12/21 0525 06/13/21 0402  PROCALCITON <0.10  --   --  <0.10 <0.10  WBC 4.3  --   --  4.7 4.0  LATICACIDVEN  --  2.9* 1.3  --   --     Microbiology Recent Results (from the past 240 hour(s))  Resp Panel by RT-PCR (Flu A&B, Covid) Nasopharyngeal Swab     Status: None   Collection Time: 06/11/21  1:53 PM   Specimen: Nasopharyngeal Swab; Nasopharyngeal(NP) swabs in vial  transport medium  Result Value Ref Range Status   SARS Coronavirus 2 by RT PCR NEGATIVE NEGATIVE Final    Comment: (NOTE) SARS-CoV-2 target nucleic acids are NOT DETECTED.  The SARS-CoV-2 RNA is generally detectable in upper respiratory specimens during the acute phase of infection. The lowest concentration of SARS-CoV-2 viral copies this assay can detect is 138 copies/mL. A negative result does not preclude SARS-Cov-2 infection and should not be used as the sole basis for treatment or other patient management decisions. A negative result may occur with  improper specimen collection/handling, submission of  specimen other than nasopharyngeal swab, presence of viral mutation(s) within the areas targeted by this assay, and inadequate number of viral copies(<138 copies/mL). A negative result must be combined with clinical observations, patient history, and epidemiological information. The expected result is Negative.  Fact Sheet for Patients:  EntrepreneurPulse.com.au  Fact Sheet for Healthcare Providers:  IncredibleEmployment.be  This test is no t yet approved or cleared by the [place] FDA and  has been authorized for detection and/or diagnosis of SARS-CoV-2 by FDA under an Emergency Use Authorization (EUA). This EUA will remain  in effect (meaning this test can be used) for the duration of the COVID-19 declaration under Section 564(b)(1) of the Act, 21 U.S.C.section 360bbb-3(b)(1), unless the authorization is terminated  or revoked sooner.       Influenza A by PCR NEGATIVE NEGATIVE Final   Influenza B by PCR NEGATIVE NEGATIVE Final    Comment: (NOTE) The Xpert Xpress SARS-CoV-2/FLU/RSV plus assay is intended as an aid in the diagnosis of influenza from Nasopharyngeal swab specimens and should not be used as a sole basis for treatment. Nasal washings and aspirates are unacceptable for Xpert Xpress SARS-CoV-2/FLU/RSV testing.  Fact  Sheet for Patients: EntrepreneurPulse.com.au  Fact Sheet for Healthcare Providers: IncredibleEmployment.be  This test is not yet approved or cleared by the [place] FDA and has been authorized for detection and/or diagnosis of SARS-CoV-2 by FDA under an Emergency Use Authorization (EUA). This EUA will remain in effect (meaning this test can be used) for the duration of the COVID-19 declaration under Section 564(b)(1) of the Act, 21 U.S.C. section 360bbb-3(b)(1), unless the authorization is terminated or revoked.  Performed at [facility], [address] [place], [place] [address]   Blood Culture (routine x 2)     Status: None (Preliminary result)   Collection Time: 06/11/21  1:58 PM   Specimen: BLOOD LEFT HAND  Result Value Ref Range Status   Specimen Description   Final    BLOOD LEFT HAND BOTTLES DRAWN AEROBIC AND ANAEROBIC   Special Requests Blood Culture adequate volume  Final   Culture   Final    NO GROWTH 2 DAYS Performed at [facility], [address], [place], [place] [address]    Report Status PENDING  Incomplete  Blood Culture (routine x 2)     Status: None (Preliminary result)   Collection Time: 06/11/21  2:44 PM   Specimen: BLOOD RIGHT HAND  Result Value Ref Range Status   Specimen Description   Final    BLOOD RIGHT HAND BOTTLES DRAWN AEROBIC AND ANAEROBIC   Special Requests Blood Culture adequate volume  Final   Culture   Final    NO GROWTH 2 DAYS Performed at [facility], [address] [place], [place] [address]    Report Status PENDING  Incomplete  Urine Culture     Status: None   Collection Time: 06/11/21  3:05 PM   Specimen: In/Out Cath Urine  Result Value Ref Range Status   Specimen Description   Final    IN/OUT CATH URINE Performed at [facility], [address] [place], [place] [address]    Special Requests   Final    NONE Performed at [facility], [address], [place], [place] [address]    Culture    Final    NO GROWTH Performed at [facility], [place] [address], [place], [place] [address]    Report Status 06/13/2021 FINAL  Final    Procedures and diagnostic studies:  DG Chest Port 1 View  Result Date: 06/11/2021 CLINICAL DATA:  Questionable sepsis - evaluate for abnormality EXAM: PORTABLE CHEST 1 VIEW COMPARISON:  Radiograph 01/16/2021, chest CT 09/07/2020 FINDINGS: Unchanged cardiomediastinal silhouette. No new focal airspace disease. No pleural effusion. No pneumothorax. No acute osseous abnormality. IMPRESSION: No evidence of acute cardiopulmonary disease. Electronically Signed   By: Maurine Simmering M.D.   On: 06/11/2021 14:17    Medications:    apixaban  5 mg Oral BID   aspirin EC  81 mg Oral Daily   atorvastatin  80 mg Oral QPM   colesevelam  1,875 mg Oral BID WC   diclofenac Sodium  2 g Topical QID   hydrALAZINE  50 mg Oral Q8H   insulin aspart  0-5 Units Subcutaneous QHS   insulin aspart  0-9 Units Subcutaneous TID WC   lisinopril  20 mg Oral Daily   pantoprazole  40 mg Oral BID   polyethylene glycol  17 g Oral Daily   Continuous Infusions:     LOS: 2 days   Geradine Girt  [facility]   How to contact the [facility] Attending or Consulting provider [place] or covering provider during after hours [place], for this patient?  Check the care team in [facility] and look for a) attending/consulting TRH provider listed and b) the [facility] team listed Log into [email or web address] and use [place]'s universal password to access. If you do not have the password, please contact the hospital operator. Locate the [facility] provider you are looking for under [facility] and page to a number that you can be directly reached. If you still have difficulty reaching the provider, please page the [facility] (Director on Call) for the Hospitalists listed on amion for assistance.  06/13/2021, 11:27 AM

## 2021-06-13 NOTE — Progress Notes (Signed)
°  Transition of Care [facility]) Screening Note   Patient Details  Name: Cassandra Adams Date of Birth: 1946-04-17   Transition of Care [facility]) CM/SW Contact:    Boneta Lucks, RN Phone Number: 06/13/2021, 4:37 PM    Transition of Care Department [facility]) has reviewed patient and no TOC needs have been identified at this time. We will continue to monitor patient advancement through interdisciplinary progression rounds. If new patient transition needs arise, please place a TOC consult.

## 2021-06-14 DIAGNOSIS — E160 Drug-induced hypoglycemia without coma: Secondary | ICD-10-CM

## 2021-06-14 DIAGNOSIS — T383X5A Adverse effect of insulin and oral hypoglycemic [antidiabetic] drugs, initial encounter: Secondary | ICD-10-CM

## 2021-06-14 LAB — BASIC METABOLIC PANEL
Anion gap: 4 — ABNORMAL LOW (ref 5–15)
BUN: 19 mg/dL (ref 8–23)
CO2: 28 mmol/L (ref 22–32)
Calcium: 9.2 mg/dL (ref 8.9–10.3)
Chloride: 104 mmol/L (ref 98–111)
Creatinine, Ser: 1.26 mg/dL — ABNORMAL HIGH (ref 0.44–1.00)
GFR, Estimated: 44 mL/min — ABNORMAL LOW (ref >60)
Glucose, Bld: 288 mg/dL — ABNORMAL HIGH (ref 70–99)
Potassium: 4.9 mmol/L (ref 3.5–5.1)
Sodium: 136 mmol/L (ref 135–145)

## 2021-06-14 LAB — GLUCOSE, CAPILLARY
Glucose-Capillary: 200 mg/dL — ABNORMAL HIGH (ref 70–99)
Glucose-Capillary: 290 mg/dL — ABNORMAL HIGH (ref 70–99)
Glucose-Capillary: 231 mg/dL — ABNORMAL HIGH (ref 70–99)
Glucose-Capillary: 226 mg/dL — ABNORMAL HIGH (ref 70–99)
Glucose-Capillary: 189 mg/dL — ABNORMAL HIGH (ref 70–99)

## 2021-06-14 MED ORDER — INSULIN GLARGINE-YFGN 100 UNIT/ML ~~LOC~~ SOLN
15.0000 [IU] | Freq: Every day | SUBCUTANEOUS | Status: DC
  Administered 2021-06-14 – 2021-06-15 (×2): 15 [IU] via SUBCUTANEOUS
  Filled 2021-06-14 (×4): qty 0.15

## 2021-06-14 NOTE — Progress Notes (Signed)
Physical Therapy Treatment Patient Details Name: Cassandra Adams MRN: [id number] DOB: 1946-02-15 Today's Date: 06/14/2021   History of Present Illness Cassandra Adams is a 75 y.o. female with a history of diabetes, coronary artery disease with history of NSTEMI in 2015, stage III chronic kidney disease, hypertension, history of TIAs, type 2 diabetes on insulin.  As the patient does not have recollection of the events, history is obtained by medical records, ER personnel, patient's daughter.  Up until the event, the patient had been feeling fine with no feelings of illness.  She had went to the bathroom and was having difficulty getting up from the toilet.  She felt weak, cold, confused, shaky.  One of her granddaughters came and checked on her, then called the patient's daughter.  The patient was acting like her blood sugar was low.  They tried to check her blood sugar several times, but was unable to draw any blood from her fingers due to her hands being very cold.  EMS was called.  While waiting for EMS, the patient had some juice and food.  By the time EMS checked her blood sugar, they noted that it was 114.  The patient was bradycardic with a heart rate in the 30s.  The patient was not experiencing any chest pain, shortness of breath.    PT Comments    Pt able to ambulate 150 ft with RW with no complaints with Mod I    Recommendations for follow up therapy are one component of a multi-disciplinary discharge planning process, led by the attending physician.  Recommendations may be updated based on patient status, additional functional criteria and insurance authorization.  Follow Up Recommendations   none     Assistance Recommended at Discharge  Per MD pt appears to be at baseline functioning level as she stays in her apartment.  Equipment Recommendations    None has RW at home    Recommendations for Other Services  none     Precautions / Restrictions Precautions Precautions:  None Restrictions Weight Bearing Restrictions: No     Mobility  Bed Mobility Overal bed mobility: Modified Independent                  Transfers Overall transfer level: Independent                      Ambulation/Gait Ambulation/Gait assistance: Modified independent (Device/Increase time) Gait Distance (Feet): 150 Feet Assistive device: Rolling walker (2 wheels) Gait Pattern/deviations: Step-through pattern                   Cognition Arousal/Alertness: Awake/alert Behavior During Therapy: WFL for tasks assessed/performed Overall Cognitive Status: Within Functional Limits for tasks assessed                                          Exercises General Exercises - Lower Extremity Long Arc Quad: Left;10 reps;Seated Heel Raises: Both;10 reps;Standing Mini-Sqauts: 10 reps;Standing    General Comments        Pertinent Vitals/Pain Pain Assessment: 0-10 Pain Score: 5  Pain Location: left knee Pain Descriptors / Indicators: Aching Pain Intervention(s): Limited activity within patient's tolerance    Home Living Family/patient expects to be discharged to:: Private residence Living Arrangements: Alone Available Help at Discharge: Family;Available PRN/intermittently Type of Home: Apartment Home Access: Level entry  Home Layout: One level Home Equipment: Conservation officer, nature (2 wheels);Cane - single point;BSC/3in1;Shower seat Additional Comments: patient does not drive    Prior Function            PT Goals (current goals can now be found in the care plan section) Acute Rehab PT Goals Patient Stated Goal: return home with family to assist PT Goal Formulation: With patient Time For Goal Achievement: 06/16/21 Potential to Achieve Goals: Good    Frequency    Min 3X/week      PT Plan  Continue therapy while in acute setting           End of Session Equipment Utilized During Treatment: Gait belt Activity Tolerance:  Patient tolerated treatment well (MD came in at end of session, pt had no complaint but had gone into A-fib)   Nurse Communication: Mobility status PT Visit Diagnosis: Unsteadiness on feet (R26.81);Other abnormalities of gait and mobility (R26.89);Muscle weakness (generalized) (M62.81)     Time: 1035-1100 PT Time Calculation (min) (ACUTE ONLY): 25 min  Charges:  $Gait Training: 8-22 mins $Therapeutic Exercise: 8-22 mins                      Rayetta Humphrey, PT CLT [phone]  06/14/2021, 11:01 AM

## 2021-06-14 NOTE — Progress Notes (Addendum)
Progress Note    Cassandra Adams  IRS:[id number] DOB: [birth year]  DOA: 06/11/2021 PCP: Sharilyn Sites, MD    Brief Narrative:     Medical records reviewed and are as summarized below:  Cassandra Adams is an 75 y.o. female with a history of diabetes, coronary artery disease with history of NSTEMI in 2015, stage III chronic kidney disease, hypertension, history of TIAs, type 2 diabetes on insulin.  As the patient does not have recollection of the events, history is obtained by medical records, ER personnel, patient's daughter.  Up until the event, the patient had been feeling fine with no feelings of illness.  She had went to the bathroom and was having difficulty getting up from the toilet.  She felt weak, cold, confused, shaky.  One of her granddaughters came and checked on her, then called the patient's daughter.  The patient was acting like her blood sugar was low.  They tried to check her blood sugar several times, but was unable to draw any blood from her fingers due to her hands being very cold.  EMS was called.  While waiting for EMS, the patient had some juice and food.  By the time EMS checked her blood sugar, they noted that it was 114.  The patient was bradycardic with a heart rate in the 30s.  Complicating stay was parox. A fib.   Assessment/Plan:   Principal Problem:   Sepsis ([place]) Active Problems:   Type 2 diabetes mellitus with diabetic neuropathy (HCC)   Essential hypertension, benign   GERD   Bradycardia   Carotid stenosis   CKD stage 3 due to type 2 diabetes mellitus (HCC)   Atrial fibrillation, chronic (HCC)   Hypoglycemia -takes SSI and 32 units at home (was given 40 units initially with subsequent low blood sugar) -held long acting as had another episode of hypoglycemia yesterday -will plan to start 15 units today -prior to d/c the DM coordinator will place continuous glucose monitor for better monitoring at home  No sepsis -afebrile/no WBC -no  source -procalcitonin negative  Bradycardia -hold BB for now (last dose 12/17 PM) -?defer to cards  Right shoulder pain -known arthritis -voltaren gel with improvement per patient  Atrial fibrillation on anticoagulation -coreg held and then she went in and out of fib on tele: per cards: trial of multaq and monitor on tele Continue Eliquis  Type 2 diabetes with possible hypoglycemic episode -see above  Hypertension -Continue antihypertensives minus BB -increase hydralazine  GERD -PPI  Stage IIIa chronic kidney disease Stable -? Lower dose of lasix at home-- defer to cards  Anemia ? CKD -Hgb stable -monitor for bleeding  Hypercalcemia -suspect due to dehydration -resolved    Family Communication/Anticipated D/C date and plan/Code Status   DVT prophylaxis: eliquis Code Status: Full Code.  Disposition Plan: Status is: Inpatient  Remains inpatient appropriate because: need further evaluation of bradycardia and hypoglycemia         Medical Consultants:   cards    Subjective:   Headache she attributes to her high blood sugar  Objective:    Vitals:   06/13/21 1753 06/13/21 2132 06/14/21 0508 06/14/21 0823  BP: (!) 151/50 (!) 186/65 (!) 140/55 (!) 154/71  Pulse:  (!) 53 73   Resp:  20 18   Temp:  97.9 F (36.6 C) 98.6 F (37 C)   TempSrc:  Oral    SpO2:  98% 99%   Weight:      Height:  Intake/Output Summary (Last 24 hours) at 06/14/2021 1112 Last data filed at 06/14/2021 1001 Gross per 24 hour  Intake 960 ml  Output --  Net 960 ml   Filed Weights   06/11/21 1329  Weight: 78.5 kg    Exam:   General: Appearance:     Overweight female in no acute distress     Lungs:     respirations unlabored  Heart:    Normal heart rate.   MS:   All extremities are intact.    Neurologic:   Awake, alert         Data Reviewed:   I have personally reviewed following labs and imaging studies:  Labs: Labs show the following:    Basic Metabolic Panel: Recent Labs  Lab 06/11/21 1400 06/12/21 0525 06/13/21 0402 06/14/21 0909  NA 134* 140 140 136  K 3.9 4.1 3.9 4.9  CL 99 109 108 104  CO2 27 26 27 28   GLUCOSE 207* 163* 74 288*  BUN 27* 25* 23 19  CREATININE 1.33* 1.20* 1.40* 1.26*  CALCIUM 10.6* 9.1 9.1 9.2   GFR Estimated Creatinine Clearance: 40.2 mL/min (A) (by C-G formula based on SCr of 1.26 mg/dL (H)). Liver Function Tests: Recent Labs  Lab 06/11/21 1400  AST 39  ALT 24  ALKPHOS 75  BILITOT 0.5  PROT 8.7*  ALBUMIN 4.7   No results for input(s): LIPASE, AMYLASE in the last 168 hours. No results for input(s): AMMONIA in the last 168 hours. Coagulation profile Recent Labs  Lab 06/11/21 1400  INR 1.2    CBC: Recent Labs  Lab 06/11/21 1400 06/12/21 0525 06/13/21 0402  WBC 4.3 4.7 4.0  NEUTROABS 2.9  --   --   HGB 12.2 9.4* 9.7*  HCT 34.6* 26.7* 28.1*  MCV 81.4 81.4 81.4  PLT 167 126* 133*   Cardiac Enzymes: No results for input(s): CKTOTAL, CKMB, CKMBINDEX, TROPONINI in the last 168 hours. BNP (last 3 results) No results for input(s): PROBNP in the last 8760 hours. CBG: Recent Labs  Lab 06/13/21 0745 06/13/21 1138 06/13/21 1632 06/13/21 2129 06/14/21 0730  GLUCAP 98 211* 221* 179* 200*   D-Dimer: No results for input(s): DDIMER in the last 72 hours. Hgb A1c: Recent Labs    06/11/21 1444  HGBA1C 8.1*   Lipid Profile: No results for input(s): CHOL, HDL, LDLCALC, TRIG, CHOLHDL, LDLDIRECT in the last 72 hours. Thyroid function studies: Recent Labs    06/11/21 1444  TSH 0.500   Anemia work up: No results for input(s): VITAMINB12, FOLATE, FERRITIN, TIBC, IRON, RETICCTPCT in the last 72 hours. Sepsis Labs: Recent Labs  Lab 06/11/21 1400 06/11/21 1444 06/11/21 1640 06/12/21 0525 06/13/21 0402  PROCALCITON <0.10  --   --  <0.10 <0.10  WBC 4.3  --   --  4.7 4.0  LATICACIDVEN  --  2.9* 1.3  --   --     Microbiology Recent Results (from the past 240  hour(s))  Resp Panel by RT-PCR (Flu A&B, Covid) Nasopharyngeal Swab     Status: None   Collection Time: 06/11/21  1:53 PM   Specimen: Nasopharyngeal Swab; Nasopharyngeal(NP) swabs in vial transport medium  Result Value Ref Range Status   SARS Coronavirus 2 by RT PCR NEGATIVE NEGATIVE Final    Comment: (NOTE) SARS-CoV-2 target nucleic acids are NOT DETECTED.  The SARS-CoV-2 RNA is generally detectable in upper respiratory specimens during the acute phase of infection. The lowest concentration of SARS-CoV-2 viral copies this assay can  detect is 138 copies/mL. A negative result does not preclude SARS-Cov-2 infection and should not be used as the sole basis for treatment or other patient management decisions. A negative result may occur with  improper specimen collection/handling, submission of specimen other than nasopharyngeal swab, presence of viral mutation(s) within the areas targeted by this assay, and inadequate number of viral copies(<138 copies/mL). A negative result must be combined with clinical observations, patient history, and epidemiological information. The expected result is Negative.  Fact Sheet for Patients:  EntrepreneurPulse.com.au  Fact Sheet for Healthcare Providers:  IncredibleEmployment.be  This test is no t yet approved or cleared by the [place] FDA and  has been authorized for detection and/or diagnosis of SARS-CoV-2 by FDA under an Emergency Use Authorization (EUA). This EUA will remain  in effect (meaning this test can be used) for the duration of the COVID-19 declaration under Section 564(b)(1) of the Act, 21 U.S.C.section 360bbb-3(b)(1), unless the authorization is terminated  or revoked sooner.       Influenza A by PCR NEGATIVE NEGATIVE Final   Influenza B by PCR NEGATIVE NEGATIVE Final    Comment: (NOTE) The Xpert Xpress SARS-CoV-2/FLU/RSV plus assay is intended as an aid in the diagnosis of influenza from  Nasopharyngeal swab specimens and should not be used as a sole basis for treatment. Nasal washings and aspirates are unacceptable for Xpert Xpress SARS-CoV-2/FLU/RSV testing.  Fact Sheet for Patients: EntrepreneurPulse.com.au  Fact Sheet for Healthcare Providers: IncredibleEmployment.be  This test is not yet approved or cleared by the [place] FDA and has been authorized for detection and/or diagnosis of SARS-CoV-2 by FDA under an Emergency Use Authorization (EUA). This EUA will remain in effect (meaning this test can be used) for the duration of the COVID-19 declaration under Section 564(b)(1) of the Act, 21 U.S.C. section 360bbb-3(b)(1), unless the authorization is terminated or revoked.  Performed at [facility], [address], [place], [place] [address]   Blood Culture (routine x 2)     Status: None (Preliminary result)   Collection Time: 06/11/21  1:58 PM   Specimen: BLOOD LEFT HAND  Result Value Ref Range Status   Specimen Description   Final    BLOOD LEFT HAND BOTTLES DRAWN AEROBIC AND ANAEROBIC   Special Requests Blood Culture adequate volume  Final   Culture   Final    NO GROWTH 2 DAYS Performed at [facility], [address] [place], [place] [address]    Report Status PENDING  Incomplete  Blood Culture (routine x 2)     Status: None (Preliminary result)   Collection Time: 06/11/21  2:44 PM   Specimen: BLOOD RIGHT HAND  Result Value Ref Range Status   Specimen Description   Final    BLOOD RIGHT HAND BOTTLES DRAWN AEROBIC AND ANAEROBIC   Special Requests Blood Culture adequate volume  Final   Culture   Final    NO GROWTH 2 DAYS Performed at [facility], [address], [place], [place] [address]    Report Status PENDING  Incomplete  Urine Culture     Status: None   Collection Time: 06/11/21  3:05 PM   Specimen: In/Out Cath Urine  Result Value Ref Range Status   Specimen Description   Final    IN/OUT CATH  URINE Performed at [facility], [address], [place], [place] [address]    Special Requests   Final    NONE Performed at [facility] : [facility], [address] [place], [place] [address]  Culture   Final    NO GROWTH Performed at [facility], [place] [address], [place], [place] [address]    Report Status 06/13/2021 FINAL  Final    Procedures and diagnostic studies:  ECHOCARDIOGRAM COMPLETE  Result Date: 06/13/2021    ECHOCARDIOGRAM REPORT   Patient Name:   Cassandra Adams [facility] Date of Exam: 06/13/2021 Medical Rec #:  [id number]            Height:       66.0 in Accession #:    [id number]           Weight:       173.0 lb Date of Birth:  [birth year]            BSA:          1.880 m Patient Age:    28 years             BP:           132/61 mmHg Patient Gender: F                    HR:           51 bpm. Exam Location:  [facility] Procedure: 2D Echo, Cardiac Doppler and Color Doppler Indications:    Abnormal EKG  History:        Patient has prior history of Echocardiogram examinations, most                 recent 12/28/2020. CHF, Previous Myocardial Infarction and CAD,                 Abnormal ECG, TIA, Arrythmias:Atrial Fibrillation,                 Signs/Symptoms:Chest Pain and Syncope; Risk                 Factors:Hypertension, Diabetes and Dyslipidemia.  Sonographer:    Wenda Low Referring Phys: [place]  1. Inferior basal hypokinesis . Left ventricular ejection fraction, by estimation, is 55%. The left ventricle has normal function. The left ventricle demonstrates regional wall motion abnormalities (see scoring diagram/findings for description). There is severe asymmetric left ventricular hypertrophy of the basal and septal segments. Left ventricular diastolic parameters were normal.  2. Right ventricular systolic function is normal. The right ventricular size is normal. There is mildly elevated pulmonary artery systolic pressure.  3. Left atrial size was moderately  dilated.  4. The mitral valve is abnormal. Mild mitral valve regurgitation. No evidence of mitral stenosis.  5. The aortic valve is tricuspid. There is mild calcification of the aortic valve. There is mild thickening of the aortic valve. Aortic valve regurgitation is not visualized. Aortic valve sclerosis/calcification is present, without any evidence of aortic stenosis.  6. The inferior vena cava is normal in size with greater than 50% respiratory variability, suggesting right atrial pressure of 3 mmHg. FINDINGS  Left Ventricle: Inferior basal hypokinesis. Left ventricular ejection fraction, by estimation, is 55%. The left ventricle has normal function. The left ventricle demonstrates regional wall motion abnormalities. The left ventricular internal cavity size was normal in size. There is severe asymmetric left ventricular hypertrophy of the basal and septal segments. Left ventricular diastolic parameters were normal. Right Ventricle: The right ventricular size is normal. No increase in right ventricular wall thickness. Right ventricular systolic function is normal. There is mildly elevated pulmonary artery systolic pressure. The tricuspid regurgitant velocity is 3.16  m/s, and with an assumed right atrial pressure of 3 mmHg, the estimated right ventricular systolic pressure is 30.0 mmHg. Left Atrium: Left atrial size was moderately dilated. Right Atrium: Right atrial size was normal in size. Pericardium: There is no evidence of pericardial effusion. Mitral Valve: The mitral valve is abnormal. There is mild thickening of the mitral valve leaflet(s). There is mild calcification of the mitral valve leaflet(s). Mild mitral annular calcification. Mild mitral valve regurgitation. No evidence of mitral valve stenosis. MV peak gradient, 7.6 mmHg. The mean mitral valve gradient is 2.0 mmHg. Tricuspid Valve: The tricuspid valve is normal in structure. Tricuspid valve regurgitation is mild . No evidence of tricuspid  stenosis. Aortic Valve: The aortic valve is tricuspid. There is mild calcification of the aortic valve. There is mild thickening of the aortic valve. Aortic valve regurgitation is not visualized. Aortic valve sclerosis/calcification is present, without any evidence of aortic stenosis. Aortic valve mean gradient measures 5.0 mmHg. Aortic valve peak gradient measures 8.1 mmHg. Pulmonic Valve: The pulmonic valve was normal in structure. Pulmonic valve regurgitation is trivial. No evidence of pulmonic stenosis. Aorta: The aortic root is normal in size and structure. Venous: The inferior vena cava is normal in size with greater than 50% respiratory variability, suggesting right atrial pressure of 3 mmHg. IAS/Shunts: No atrial level shunt detected by color flow Doppler.  LEFT VENTRICLE PLAX 2D LVIDd:         5.40 cm     Diastology LVIDs:         3.40 cm     LV e' medial:    7.28 cm/s LV PW:         1.30 cm     LV E/e' medial:  17.9 LV IVS:        1.80 cm     LV e' lateral:   11.20 cm/s                            LV E/e' lateral: 11.6  LV Volumes (MOD) LV vol d, MOD A4C: 81.2 ml LV vol s, MOD A4C: 26.3 ml LV SV MOD A4C:     81.2 ml RIGHT VENTRICLE RV Basal diam:  3.25 cm RV Mid diam:    2.80 cm RV S prime:     13.90 cm/s TAPSE (M-mode): 3.2 cm LEFT ATRIUM              Index        RIGHT ATRIUM           Index LA diam:        5.10 cm  2.71 cm/m   RA Area:     21.10 cm LA Vol (A2C):   143.0 ml 76.05 ml/m  RA Volume:   57.60 ml  30.63 ml/m LA Vol (A4C):   135.0 ml 71.80 ml/m LA Biplane Vol: 144.0 ml 76.59 ml/m  AORTIC VALVE                    PULMONIC VALVE AV Vmax:           142.00 cm/s  PV Vmax:       0.96 m/s AV Vmean:          104.000 cm/s PV Peak grad:  3.7 mmHg AV VTI:            0.416 m AV Peak Grad:      8.1 mmHg AV Mean Grad:  5.0 mmHg LVOT Vmax:         100.00 cm/s LVOT Vmean:        67.200 cm/s LVOT VTI:          0.292 m LVOT/AV VTI ratio: 0.70  AORTA Ao Root diam: 3.30 cm Ao Asc diam:  3.10 cm MITRAL  VALVE                TRICUSPID VALVE MV Area (PHT): 2.90 cm     TR Peak grad:   39.9 mmHg MV Peak grad:  7.6 mmHg     TR Vmax:        316.00 cm/s MV Mean grad:  2.0 mmHg MV Vmax:       1.38 m/s     SHUNTS MV Vmean:      58.8 cm/s    Systemic VTI: 0.29 m MV Decel Time: 262 msec MV E velocity: 130.00 cm/s Jenkins Rouge MD Electronically signed by Jenkins Rouge MD Signature Date/Time: 06/13/2021/5:14:47 PM    Final     Medications:    apixaban  5 mg Oral BID   aspirin EC  81 mg Oral Daily   atorvastatin  80 mg Oral QPM   colesevelam  1,875 mg Oral BID WC   diclofenac Sodium  2 g Topical QID   hydrALAZINE  50 mg Oral Q8H   insulin aspart  0-15 Units Subcutaneous TID WC   insulin aspart  0-5 Units Subcutaneous QHS   insulin glargine-yfgn  15 Units Subcutaneous Daily   lisinopril  20 mg Oral Daily   pantoprazole  40 mg Oral BID   polyethylene glycol  17 g Oral Daily   Continuous Infusions:     LOS: 3 days   Geradine Girt  [facility]   How to contact the [facility] Attending or Consulting provider [place] or covering provider during after hours [place], for this patient?  Check the care team in [facility] and look for a) attending/consulting TR[facility] provider listed and b) the [facility] team listed Log into [email or web address] and use [place]'s universal password to access. If you do not have the password, please contact the hospital operator. Locate the [facility] provider you are looking for under [facility] and page to a number that you can be directly reached. If you still have difficulty reaching the provider, please page the [facility] (Director on Call) for the Hospitalists listed on amion for assistance.  06/14/2021, 11:12 AM

## 2021-06-14 NOTE — Progress Notes (Signed)
Progress Note  Patient Name: Cassandra Adams Date of Encounter: 06/14/2021  [facility] Cardiologist: [name], MD   Subjective   Pt denies CP   Was up walking in hall earlier and was a little dizzy    Inpatient Medications    Scheduled Meds:  apixaban  5 mg Oral BID   aspirin EC  81 mg Oral Daily   atorvastatin  80 mg Oral QPM   colesevelam  1,875 mg Oral BID WC   diclofenac Sodium  2 g Topical QID   hydrALAZINE  50 mg Oral Q8H   insulin aspart  0-15 Units Subcutaneous TID WC   insulin aspart  0-5 Units Subcutaneous QHS   insulin glargine-yfgn  15 Units Subcutaneous Daily   lisinopril  20 mg Oral Daily   pantoprazole  40 mg Oral BID   polyethylene glycol  17 g Oral Daily   Continuous Infusions:  Vital Signs    Vitals:   06/13/21 1753 06/13/21 2132 06/14/21 0508 06/14/21 0823  BP: (!) 151/50 (!) 186/65 (!) 140/55 (!) 154/71  Pulse:  (!) 53 73   Resp:  20 18   Temp:  97.9 F (36.6 C) 98.6 F (37 C)   TempSrc:  Oral    SpO2:  98% 99%   Weight:      Height:        Intake/Output Summary (Last 24 hours) at 06/14/2021 1134 Last data filed at 06/14/2021 1001 Gross per 24 hour  Intake 960 ml  Output --  Net 960 ml   Last 3 Weights 06/11/2021 03/15/2021 02/16/2021  Weight (lbs) 173 lb 165 lb 9.6 oz 176 lb  Weight (kg) 78.472 kg 75.116 kg 79.833 kg      Telemetry    SR and afib with some RVR (120s at times ) - Personally Reviewed  ECG    No new  - Personally Reviewed  Physical Exam   GEN: No acute distress.   Neck: No JVD Cardiac: RRR, no murmurs, Respiratory: Clear to auscultation bilaterally. GI: Soft, nontender, non-distended  MS: No edema; No deformity. Neuro:  Nonfocal  Psych: Normal affect   Labs    High Sensitivity Troponin:   Recent Labs  Lab 06/13/21 1256 06/13/21 1443  TROPONINIHS 29* 23*     Chemistry Recent Labs  Lab 06/11/21 1400 06/12/21 0525 06/13/21 0402 06/14/21 0909  NA 134* 140 140 136  K 3.9 4.1  3.9 4.9  CL 99 109 108 104  CO2 27 26 27 28   GLUCOSE 207* 163* 74 288*  BUN 27* 25* 23 19  CREATININE 1.33* 1.20* 1.40* 1.26*  CALCIUM 10.6* 9.1 9.1 9.2  PROT 8.7*  --   --   --   ALBUMIN 4.7  --   --   --   AST 39  --   --   --   ALT 24  --   --   --   ALKPHOS 75  --   --   --   BILITOT 0.5  --   --   --   GFRNONAA 41* 47* 39* 44*  ANIONGAP 8 5 5  4*    Lipids No results for input(s): CHOL, TRIG, HDL, LABVLDL, LDLCALC, CHOLHDL in the last 168 hours.  Hematology Recent Labs  Lab 06/11/21 1400 06/12/21 0525 06/13/21 0402  WBC 4.3 4.7 4.0  RBC 4.25 3.28* 3.45*  HGB 12.2 9.4* 9.7*  HCT 34.6* 26.7* 28.1*  MCV 81.4 81.4 81.4  MCH 28.7  28.7 28.1  MCHC 35.3 35.2 34.5  RDW 15.3 15.0 15.2  PLT 167 126* 133*   Thyroid  Recent Labs  Lab 06/11/21 1444  TSH 0.500    BNP Recent Labs  Lab 06/11/21 1400  BNP 275.0*    DDimer No results for input(s): DDIMER in the last 168 hours.   Radiology      Cardiac Studies   Echocardiogram 06/13/21  1. Inferior basal hypokinesis . Left ventricular ejection fraction, by  estimation, is 55%. The left ventricle has normal function. The left  ventricle demonstrates regional wall motion abnormalities (see scoring  diagram/findings for description). There  is severe asymmetric left ventricular hypertrophy of the basal and septal  segments. Left ventricular diastolic parameters were normal.   2. Right ventricular systolic function is normal. The right ventricular  size is normal. There is mildly elevated pulmonary artery systolic  pressure.   3. Left atrial size was moderately dilated.   4. The mitral valve is abnormal. Mild mitral valve regurgitation. No  evidence of mitral stenosis.   5. The aortic valve is tricuspid. There is mild calcification of the  aortic valve. There is mild thickening of the aortic valve. Aortic valve  regurgitation is not visualized. Aortic valve sclerosis/calcification is  present, without any evidence of   aortic stenosis.   6. The inferior vena cava is normal in size with greater than 50%  respiratory variability, suggesting right atrial pressure of 3 mmHg.  Patient Profile     75 y.o. female with a hx of CAD s/p NSTEMI in 7/15 (CTO of the RCA, moderate LAD disease-medical therapy), HFpEF, paroxysmal atrial fibrillation (s/p DCCV in 7/22), prior TIA, CKD, hypertension, hyperlipidemia, diabetes mellitus, prior upper GI bleed who was initially seen 06/13/2021 for the evaluation of bradycardia.  Assessment & Plan    1  PAF   Pt has been in/out of afib now that on tele.   QUestion if this explains some of her symptoms     I would recomm trial of Multaq 400 bid with food and follow  Continue Eliquis   2  Sinus bracyardia.   Follow now that off of carvedilol    May need to add low dose b blocker   Follow on tele.   3  Coronary artery disease  Hx of NSTEMI in 2015 with occluded RCA and mod disease in the LAD.  She has been managed medically since then.  She has not had angina.  Echocardiogram this admission with EF 55, inf HK, asymmetric LVH.  There has been no significant change since her last echocardiogram.  Her hs-Trop was minimally elevated without trend (29>>23).  This is not c/w ACS. She does not need further ischemic w/u as an IP.       4.  (HFpEF) heart failure with preserved ejection fraction  Echo:   LVEF 55% Volume status stable.  She was on furosemide 40 mg once daily prior to admission.  This should be resumed at DC.     5. Chronic kidney disease  Creatinine is stable.  1.26 today     6. Hypertension  BP elevated.  Now off Carvedilol.  Hydralazine started.  Continue Lisinopril 20 mg once daily.  Follow    For questions or updates, please contact [place] Please consult [email or web address] for contact info under       Signed, Richardson Dopp, PA-C  06/14/2021, 11:34 AM

## 2021-06-15 ENCOUNTER — Other Ambulatory Visit: Payer: Self-pay

## 2021-06-15 ENCOUNTER — Inpatient Hospital Stay (INDEPENDENT_AMBULATORY_CARE_PROVIDER_SITE_OTHER): Payer: Medicare HMO

## 2021-06-15 DIAGNOSIS — I4819 Other persistent atrial fibrillation: Secondary | ICD-10-CM | POA: Diagnosis not present

## 2021-06-15 DIAGNOSIS — I480 Paroxysmal atrial fibrillation: Secondary | ICD-10-CM

## 2021-06-15 DIAGNOSIS — I482 Chronic atrial fibrillation, unspecified: Secondary | ICD-10-CM | POA: Diagnosis not present

## 2021-06-15 DIAGNOSIS — E1140 Type 2 diabetes mellitus with diabetic neuropathy, unspecified: Secondary | ICD-10-CM | POA: Diagnosis not present

## 2021-06-15 DIAGNOSIS — E1122 Type 2 diabetes mellitus with diabetic chronic kidney disease: Secondary | ICD-10-CM | POA: Diagnosis not present

## 2021-06-15 DIAGNOSIS — R001 Bradycardia, unspecified: Secondary | ICD-10-CM | POA: Diagnosis not present

## 2021-06-15 DIAGNOSIS — N183 Chronic kidney disease, stage 3 unspecified: Secondary | ICD-10-CM | POA: Diagnosis not present

## 2021-06-15 DIAGNOSIS — I6521 Occlusion and stenosis of right carotid artery: Secondary | ICD-10-CM

## 2021-06-15 DIAGNOSIS — Z794 Long term (current) use of insulin: Secondary | ICD-10-CM | POA: Diagnosis not present

## 2021-06-15 DIAGNOSIS — I10 Essential (primary) hypertension: Secondary | ICD-10-CM | POA: Diagnosis not present

## 2021-06-15 LAB — GLUCOSE, CAPILLARY
Glucose-Capillary: 184 mg/dL — ABNORMAL HIGH (ref 70–99)
Glucose-Capillary: 369 mg/dL — ABNORMAL HIGH (ref 70–99)

## 2021-06-15 MED ORDER — HYDRALAZINE HCL 20 MG/ML IJ SOLN: 10.0000 mg | INTRAMUSCULAR | Status: DC | PRN

## 2021-06-15 MED ORDER — COLESEVELAM HCL 625 MG PO TABS
1875.0000 mg | ORAL_TABLET | Freq: Two times a day (BID) | ORAL | Status: DC
  Administered 2021-06-15: 16:00:00 1875 mg via ORAL
  Filled 2021-06-15 (×5): qty 3

## 2021-06-15 MED ORDER — GUAIFENESIN 100 MG/5ML PO LIQD: 5.0000 mL | ORAL | Status: DC | PRN

## 2021-06-15 MED ORDER — TRAZODONE HCL 50 MG PO TABS: 50.0000 mg | ORAL_TABLET | Freq: Every evening | ORAL | Status: DC | PRN

## 2021-06-15 MED ORDER — FREESTYLE LIBRE 2 READER DEVI: 1.0000 | Freq: Every day | 0 refills | Status: DC

## 2021-06-15 MED ORDER — SENNOSIDES-DOCUSATE SODIUM 8.6-50 MG PO TABS: 1.0000 | ORAL_TABLET | Freq: Every evening | ORAL | Status: DC | PRN

## 2021-06-15 MED ORDER — FREESTYLE LIBRE 2 SENSOR MISC: 1.0000 | Freq: Every day | 0 refills | Status: DC

## 2021-06-15 MED ORDER — DRONEDARONE HCL 400 MG PO TABS
400.0000 mg | ORAL_TABLET | Freq: Two times a day (BID) | ORAL | Status: DC
  Administered 2021-06-15 (×2): 400 mg via ORAL
  Filled 2021-06-15 (×6): qty 1

## 2021-06-15 MED ORDER — METOPROLOL TARTRATE 5 MG/5ML IV SOLN: 5.0000 mg | INTRAVENOUS | Status: DC | PRN

## 2021-06-15 MED ORDER — DRONEDARONE HCL 400 MG PO TABS: 400.0000 mg | ORAL_TABLET | Freq: Two times a day (BID) | ORAL | 0 refills | Status: DC

## 2021-06-15 NOTE — Progress Notes (Signed)
Inpatient Diabetes Program Recommendations  AACE/ADA: New Consensus Statement on Inpatient Glycemic Control (2015)  Target Ranges:  Prepandial:   less than 140 mg/dL      Peak postprandial:   less than 180 mg/dL (1-2 hours)      Critically ill patients:  140 - 180 mg/dL   Lab Results  Component Value Date   GLUCAP 369 (H) 06/15/2021   HGBA1C 8.1 (H) 06/11/2021    Review of Glycemic Control Diabetes history: DM2 Outpatient Diabetes medications: lantus 32 units QHS, Novolog 6-9 units TID, Glipizide 2.5 mg QAM Current orders for Inpatient glycemic control: 0-15 units TID and 0-5 units QHS, Semglee 15 QD   Inpatient Diabetes Program Recommendations:    For DC, please consider:  Lantus 32 units QD (home dose) Novolog 5 units TID with meals if eats at least 50% Discontinue Glipize  Follow up with Dr. Dorris Fetch on 12/28 (scheduled appointment)  May benefit from [facility] Order# [id number] Reader [phone] (she does not have a smart phone and will need the reader)  Will continue to follow while inpatient.  Thank you, Reche Dixon, RN, BSN Diabetes Coordinator Inpatient Diabetes Program [phone] (team pager from 8a-5p)

## 2021-06-15 NOTE — Care Management Important Message (Signed)
Important Message  Patient Details  Name: Cassandra Adams MRN: [id number] Date of Birth: 22-Dec-1945   Medicare Important Message Given:  Yes     Tommy Medal 06/15/2021, 2:40 PM

## 2021-06-15 NOTE — Discharge Summary (Signed)
Physician Discharge Summary  Cassandra Adams QMV:[id number] DOB: [birth year] DOA: 06/11/2021  PCP: Sharilyn Sites, MD  Admit date: 06/11/2021 Discharge date: 06/15/2021  Admitted From: Home Disposition: Home  Recommendations for Outpatient Follow-up:  Follow up with PCP in 1-2 weeks Please obtain BMP/CBC in one week your next doctors visit.  Discontinue glipizide Continue home Lantus 32 units daily.  Premeal insulin 5 units before meals if she is at least 50% Follow-up outpatient with endocrinology, Dr. Dorris Fetch on 12/28 Libre sensor and reader prescribed Outpatient follow-up with cardiology.  Discontinue beta-blocker.  Multaq prescribed   Discharge Condition: Stable CODE STATUS: Full code Diet recommendation: Diabetic  Brief/Interim Summary: 75 year old with history of DM2, CAD, CKD stage IIIa, HTN, TIA admitted to the hospital for hypoglycemia and bradycardia.  She was also atrial fibrillation with intermittent RVR.  Her diabetic regimen was adjusted with the help of diabetic coordinator.  Final regimen as mentioned above.  Patient was also seen by cardiology team who discontinued her beta-blocker and started Multaq. Patient is medically stable to be discharged today.   Diabetes mellitus type 2, insulin-dependent - Due to hypoglycemic episodes glipizide has been discontinued.  Patient will continue on Lantus 32 units daily, Premeal NovoLog 5 units before meals.  Follow-up outpatient endocrinology Dr. Dorris Fetch 06/22/2021.  Libre sensor and reader has been prescribed.  Atrial fibrillation, chronic - Due to intermittent A. fib Multaq has been started.  Coreg discontinued.  Continue Eliquis.  Essential hypertension - Continue lisinopril and Multaq  GERD - PPI  CKD stage IIIa - Stable creatinine  Body mass index is 27.92 kg/m.  Pressure Injury 01/18/21 Sacrum Medial Stage 2 -  Partial thickness loss of dermis presenting as a shallow open injury with a red, pink wound bed without  slough. some skin break down with pain (Active)  01/18/21 0800  Location: Sacrum  Location Orientation: Medial  Staging: Stage 2 -  Partial thickness loss of dermis presenting as a shallow open injury with a red, pink wound bed without slough.  Wound Description (Comments): some skin break down with pain  Present on Admission: No        Discharge Diagnoses:  Principal Problem:   Sepsis ([place]) Active Problems:   Type 2 diabetes mellitus with diabetic neuropathy (HCC)   Essential hypertension, benign   GERD   PAF (paroxysmal atrial fibrillation) (HCC)   Bradycardia   Carotid stenosis   CKD stage 3 due to type 2 diabetes mellitus (HCC)   Atrial fibrillation, chronic ([place])      Consultations: Cardiology  Subjective: Feeling great no complaints  Discharge Exam: Vitals:   06/15/21 0554 06/15/21 1252  BP: (!) 179/62 (!) 184/65  Pulse: 60 (!) 56  Resp: 18 17  Temp: 97.9 F (36.6 C) 98.2 F (36.8 C)  SpO2: 96% 100%   Vitals:   06/14/21 2006 06/15/21 0424 06/15/21 0554 06/15/21 1252  BP: (!) 150/62 (!) 171/57 (!) 179/62 (!) 184/65  Pulse: (!) 56 (!) 50 60 (!) 56  Resp: 20 20 18 17   Temp: 98 F (36.7 C) 98.3 F (36.8 C) 97.9 F (36.6 C) 98.2 F (36.8 C)  TempSrc:   Oral Oral  SpO2: 100% 98% 96% 100%  Weight:      Height:        General: Pt is alert, awake, not in acute distress Cardiovascular: RRR, S1/S2 +, no rubs, no gallops Respiratory: CTA bilaterally, no wheezing, no rhonchi Abdominal: Soft, NT, ND, bowel sounds + Extremities: no edema, no cyanosis  Discharge Instructions   Allergies as of 06/15/2021       Reactions   Penicillins Hives   Has patient had a PCN reaction causing immediate rash, facial/tongue/throat swelling, SOB or lightheadedness with hypotension: no Has patient had a PCN reaction causing severe rash involving mucus membranes or skin necrosis: No  Has patient had a PCN reaction that required hospitalization: no Has patient had a  PCN reaction occurring within the last 10 years: no If all of the above answers are "NO", then may proceed with Cephalosporin use.        Medication List     STOP taking these medications    carvedilol 12.5 MG tablet Commonly known as: COREG   glipiZIDE 2.5 MG 24 hr tablet Commonly known as: GLUCOTROL XL       TAKE these medications    aspirin EC 81 MG tablet Take 1 tablet (81 mg total) by mouth daily.   atorvastatin 80 MG tablet Commonly known as: LIPITOR Take 1 tablet (80 mg total) by mouth every evening.   Basaglar KwikPen 100 UNIT/ML INJECT 32 UNITS INTO THE SKIN AT BEDTIME.   BD Insulin Syringe U/F 31G X 5/16" 0.5 ML Misc Generic drug: Insulin Syringe-Needle U-100 Use to take with Novolog TIDAC   colesevelam 625 MG tablet Commonly known as: WELCHOL Take 1,875 mg by mouth 2 (two) times daily with a meal. Noon and night   dronedarone 400 MG tablet Commonly known as: MULTAQ Take 1 tablet (400 mg total) by mouth 2 (two) times daily with a meal.   Eliquis 5 MG Tabs tablet Generic drug: apixaban Take 1 tablet (5 mg total) by mouth 2 (two) times daily.   FreeStyle Libre 2 Reader [place] 1 each by Does not apply route daily in the afternoon.   FreeStyle Libre 2 Sensor Misc 1 each by Does not apply route daily in the afternoon.   furosemide 40 MG tablet Commonly known as: LASIX Take 40 mg  one tablet by mouth daily , may take an additional 40 mg if needed daily if leg edema worsens   hydrALAZINE 50 MG tablet Commonly known as: APRESOLINE Take 1 tablet (50 mg total) by mouth 3 (three) times daily. What changed: how much to take   lisinopril 20 MG tablet Commonly known as: ZESTRIL Take 1 tablet (20 mg total) by mouth daily.   nitroGLYCERIN 0.4 MG SL tablet Commonly known as: NITROSTAT PLACE 1 TABLET UNDER THE TONGUE EVERY 5 MINUTES AS NEEDED FOR CHEST PAIN (UP TO 3 DOSES) What changed: See the new instructions.   NovoLOG 100 UNIT/ML injection Generic  drug: insulin aspart Inject 6-9 Units into the skin 3 (three) times daily with meals. Sliding Scale   pantoprazole 40 MG tablet Commonly known as: PROTONIX Take 1 tablet (40 mg total) by mouth 2 (two) times daily.   polyethylene glycol 17 g packet Commonly known as: MIRALAX / GLYCOLAX Take 17 g by mouth daily.   senna-docusate 8.6-50 MG tablet Commonly known as: Senokot-S Take 1 tablet by mouth at bedtime as needed for mild constipation.   Vitamin D (Cholecalciferol) 25 MCG (1000 UT) Tabs Take 2,000 Units by mouth every other day.        Follow-up Information     [name], MD Follow up on 06/30/2021.   Specialty: Cardiology Why: Keep scheduled Cardiology Hospital Follow-up on 06/30/2021 at 8:30 AM. Contact information: [address] [place] [address] [phone]  Allergies  Allergen Reactions   Penicillins Hives    Has patient had a PCN reaction causing immediate rash, facial/tongue/throat swelling, SOB or lightheadedness with hypotension: no Has patient had a PCN reaction causing severe rash involving mucus membranes or skin necrosis: No  Has patient had a PCN reaction that required hospitalization: no Has patient had a PCN reaction occurring within the last 10 years: no If all of the above answers are "NO", then may proceed with Cephalosporin use.     You were cared for by a hospitalist during your hospital stay. If you have any questions about your discharge medications or the care you received while you were in the hospital after you are discharged, you can call the unit and asked to speak with the hospitalist on call if the hospitalist that took care of you is not available. Once you are discharged, your primary care physician will handle any further medical issues. Please note that no refills for any discharge medications will be authorized once you are discharged, as it is imperative that you return to your primary care  physician (or establish a relationship with a primary care physician if you do not have one) for your aftercare needs so that they can reassess your need for medications and monitor your lab values.   Procedures/Studies: DG Chest Port 1 View  Result Date: 06/11/2021 CLINICAL DATA:  Questionable sepsis - evaluate for abnormality EXAM: PORTABLE CHEST 1 VIEW COMPARISON:  Radiograph 01/16/2021, chest CT 09/07/2020 FINDINGS: Unchanged cardiomediastinal silhouette. No new focal airspace disease. No pleural effusion. No pneumothorax. No acute osseous abnormality. IMPRESSION: No evidence of acute cardiopulmonary disease. Electronically Signed   By: Maurine Simmering M.D.   On: 06/11/2021 14:17   ECHOCARDIOGRAM COMPLETE  Result Date: 06/13/2021    ECHOCARDIOGRAM REPORT   Patient Name:   Cassandra Adams [facility] Date of Exam: 06/13/2021 Medical Rec #:  [id number]            Height:       66.0 in Accession #:    [id number]           Weight:       173.0 lb Date of Birth:  1946/06/19            BSA:          1.880 m Patient Age:    75 years             BP:           132/61 mmHg Patient Gender: F                    HR:           51 bpm. Exam Location:  [facility] Procedure: 2D Echo, Cardiac Doppler and Color Doppler Indications:    Abnormal EKG  History:        Patient has prior history of Echocardiogram examinations, most                 recent 12/28/2020. CHF, Previous Myocardial Infarction and CAD,                 Abnormal ECG, TIA, Arrythmias:Atrial Fibrillation,                 Signs/Symptoms:Chest Pain and Syncope; Risk                 Factors:Hypertension, Diabetes and Dyslipidemia.  Sonographer:    Wenda Low Referring Phys: [place]  IMPRESSIONS  1. Inferior basal hypokinesis . Left ventricular ejection fraction, by estimation, is 55%. The left ventricle has normal function. The left ventricle demonstrates regional wall motion abnormalities (see scoring diagram/findings for description). There is severe  asymmetric left ventricular hypertrophy of the basal and septal segments. Left ventricular diastolic parameters were normal.  2. Right ventricular systolic function is normal. The right ventricular size is normal. There is mildly elevated pulmonary artery systolic pressure.  3. Left atrial size was moderately dilated.  4. The mitral valve is abnormal. Mild mitral valve regurgitation. No evidence of mitral stenosis.  5. The aortic valve is tricuspid. There is mild calcification of the aortic valve. There is mild thickening of the aortic valve. Aortic valve regurgitation is not visualized. Aortic valve sclerosis/calcification is present, without any evidence of aortic stenosis.  6. The inferior vena cava is normal in size with greater than 50% respiratory variability, suggesting right atrial pressure of 3 mmHg. FINDINGS  Left Ventricle: Inferior basal hypokinesis. Left ventricular ejection fraction, by estimation, is 55%. The left ventricle has normal function. The left ventricle demonstrates regional wall motion abnormalities. The left ventricular internal cavity size was normal in size. There is severe asymmetric left ventricular hypertrophy of the basal and septal segments. Left ventricular diastolic parameters were normal. Right Ventricle: The right ventricular size is normal. No increase in right ventricular wall thickness. Right ventricular systolic function is normal. There is mildly elevated pulmonary artery systolic pressure. The tricuspid regurgitant velocity is 3.16  m/s, and with an assumed right atrial pressure of 3 mmHg, the estimated right ventricular systolic pressure is 10.2 mmHg. Left Atrium: Left atrial size was moderately dilated. Right Atrium: Right atrial size was normal in size. Pericardium: There is no evidence of pericardial effusion. Mitral Valve: The mitral valve is abnormal. There is mild thickening of the mitral valve leaflet(s). There is mild calcification of the mitral valve leaflet(s).  Mild mitral annular calcification. Mild mitral valve regurgitation. No evidence of mitral valve stenosis. MV peak gradient, 7.6 mmHg. The mean mitral valve gradient is 2.0 mmHg. Tricuspid Valve: The tricuspid valve is normal in structure. Tricuspid valve regurgitation is mild . No evidence of tricuspid stenosis. Aortic Valve: The aortic valve is tricuspid. There is mild calcification of the aortic valve. There is mild thickening of the aortic valve. Aortic valve regurgitation is not visualized. Aortic valve sclerosis/calcification is present, without any evidence of aortic stenosis. Aortic valve mean gradient measures 5.0 mmHg. Aortic valve peak gradient measures 8.1 mmHg. Pulmonic Valve: The pulmonic valve was normal in structure. Pulmonic valve regurgitation is trivial. No evidence of pulmonic stenosis. Aorta: The aortic root is normal in size and structure. Venous: The inferior vena cava is normal in size with greater than 50% respiratory variability, suggesting right atrial pressure of 3 mmHg. IAS/Shunts: No atrial level shunt detected by color flow Doppler.  LEFT VENTRICLE PLAX 2D LVIDd:         5.40 cm     Diastology LVIDs:         3.40 cm     LV e' medial:    7.28 cm/s LV PW:         1.30 cm     LV E/e' medial:  17.9 LV IVS:        1.80 cm     LV e' lateral:   11.20 cm/s  LV E/e' lateral: 11.6  LV Volumes (MOD) LV vol d, MOD A4C: 81.2 ml LV vol s, MOD A4C: 26.3 ml LV SV MOD A4C:     81.2 ml RIGHT VENTRICLE RV Basal diam:  3.25 cm RV Mid diam:    2.80 cm RV S prime:     13.90 cm/s TAPSE (M-mode): 3.2 cm LEFT ATRIUM              Index        RIGHT ATRIUM           Index LA diam:        5.10 cm  2.71 cm/m   RA Area:     21.10 cm LA Vol (A2C):   143.0 ml 76.05 ml/m  RA Volume:   57.60 ml  30.63 ml/m LA Vol (A4C):   135.0 ml 71.80 ml/m LA Biplane Vol: 144.0 ml 76.59 ml/m  AORTIC VALVE                    PULMONIC VALVE AV Vmax:           142.00 cm/s  PV Vmax:       0.96 m/s AV Vmean:           104.000 cm/s PV Peak grad:  3.7 mmHg AV VTI:            0.416 m AV Peak Grad:      8.1 mmHg AV Mean Grad:      5.0 mmHg LVOT Vmax:         100.00 cm/s LVOT Vmean:        67.200 cm/s LVOT VTI:          0.292 m LVOT/AV VTI ratio: 0.70  AORTA Ao Root diam: 3.30 cm Ao Asc diam:  3.10 cm MITRAL VALVE                TRICUSPID VALVE MV Area (PHT): 2.90 cm     TR Peak grad:   39.9 mmHg MV Peak grad:  7.6 mmHg     TR Vmax:        316.00 cm/s MV Mean grad:  2.0 mmHg MV Vmax:       1.38 m/s     SHUNTS MV Vmean:      58.8 cm/s    Systemic VTI: 0.29 m MV Decel Time: 262 msec MV E velocity: 130.00 cm/s Jenkins Rouge MD Electronically signed by Jenkins Rouge MD Signature Date/Time: 06/13/2021/5:14:47 PM    Final      The results of significant diagnostics from this hospitalization (including imaging, microbiology, ancillary and laboratory) are listed below for reference.     Microbiology: Recent Results (from the past 240 hour(s))  Resp Panel by RT-PCR (Flu A&B, Covid) Nasopharyngeal Swab     Status: None   Collection Time: 06/11/21  1:53 PM   Specimen: Nasopharyngeal Swab; Nasopharyngeal(NP) swabs in vial transport medium  Result Value Ref Range Status   SARS Coronavirus 2 by RT PCR NEGATIVE NEGATIVE Final    Comment: (NOTE) SARS-CoV-2 target nucleic acids are NOT DETECTED.  The SARS-CoV-2 RNA is generally detectable in upper respiratory specimens during the acute phase of infection. The lowest concentration of SARS-CoV-2 viral copies this assay can detect is 138 copies/mL. A negative result does not preclude SARS-Cov-2 infection and should not be used as the sole basis for treatment or other patient management decisions. A negative result may occur with  improper specimen collection/handling, submission of  specimen other than nasopharyngeal swab, presence of viral mutation(s) within the areas targeted by this assay, and inadequate number of viral copies(<138 copies/mL). A negative result must be  combined with clinical observations, patient history, and epidemiological information. The expected result is Negative.  Fact Sheet for Patients:  EntrepreneurPulse.com.au  Fact Sheet for Healthcare Providers:  IncredibleEmployment.be  This test is no t yet approved or cleared by the [place] FDA and  has been authorized for detection and/or diagnosis of SARS-CoV-2 by FDA under an Emergency Use Authorization (EUA). This EUA will remain  in effect (meaning this test can be used) for the duration of the COVID-19 declaration under Section 564(b)(1) of the Act, 21 U.S.C.section 360bbb-3(b)(1), unless the authorization is terminated  or revoked sooner.       Influenza A by PCR NEGATIVE NEGATIVE Final   Influenza B by PCR NEGATIVE NEGATIVE Final    Comment: (NOTE) The Xpert Xpress SARS-CoV-2/FLU/RSV plus assay is intended as an aid in the diagnosis of influenza from Nasopharyngeal swab specimens and should not be used as a sole basis for treatment. Nasal washings and aspirates are unacceptable for Xpert Xpress SARS-CoV-2/FLU/RSV testing.  Fact Sheet for Patients: EntrepreneurPulse.com.au  Fact Sheet for Healthcare Providers: IncredibleEmployment.be  This test is not yet approved or cleared by the [place] FDA and has been authorized for detection and/or diagnosis of SARS-CoV-2 by FDA under an Emergency Use Authorization (EUA). This EUA will remain in effect (meaning this test can be used) for the duration of the COVID-19 declaration under Section 564(b)(1) of the Act, 21 U.S.C. section 360bbb-3(b)(1), unless the authorization is terminated or revoked.  Performed at [facility], [address] [place], [place] [address]   Blood Culture (routine x 2)     Status: None (Preliminary result)   Collection Time: 06/11/21  1:58 PM   Specimen: BLOOD LEFT HAND  Result Value Ref Range Status   Specimen  Description   Final    BLOOD LEFT HAND BOTTLES DRAWN AEROBIC AND ANAEROBIC   Special Requests Blood Culture adequate volume  Final   Culture   Final    NO GROWTH 2 DAYS Performed at [facility], [address], [place], [place] [address]    Report Status PENDING  Incomplete  Blood Culture (routine x 2)     Status: None (Preliminary result)   Collection Time: 06/11/21  2:44 PM   Specimen: BLOOD RIGHT HAND  Result Value Ref Range Status   Specimen Description   Final    BLOOD RIGHT HAND BOTTLES DRAWN AEROBIC AND ANAEROBIC   Special Requests Blood Culture adequate volume  Final   Culture   Final    NO GROWTH 2 DAYS Performed at [facility], [address], [place], [place] [address]    Report Status PENDING  Incomplete  Urine Culture     Status: None   Collection Time: 06/11/21  3:05 PM   Specimen: In/Out Cath Urine  Result Value Ref Range Status   Specimen Description   Final    IN/OUT CATH URINE Performed at [facility], [address] [place], [place] [address]    Special Requests   Final    NONE Performed at [facility], [address] [place], [place] [address]    Culture   Final    NO GROWTH Performed at [facility], [place] [address] [place], [place] [address]    Report Status 06/13/2021 FINAL  Final     Labs: BNP (last 3  results) Recent Labs    01/16/21 0353 06/11/21 1400  BNP 631.7* 831.5*   Basic Metabolic Panel: Recent Labs  Lab 06/11/21 1400 06/12/21 0525 06/13/21 0402 06/14/21 0909  NA 134* 140 140 136  K 3.9 4.1 3.9 4.9  CL 99 109 108 104  CO2 27 26 27 28   GLUCOSE 207* 163* 74 288*  BUN 27* 25* 23 19  CREATININE 1.33* 1.20* 1.40* 1.26*  CALCIUM 10.6* 9.1 9.1 9.2   Liver Function Tests: Recent Labs  Lab 06/11/21 1400  AST 39  ALT 24  ALKPHOS 75  BILITOT 0.5  PROT 8.7*  ALBUMIN 4.7   No results for input(s): LIPASE, AMYLASE in the last 168 hours. No results for input(s): AMMONIA in the last 168 hours. CBC: Recent Labs  Lab  06/11/21 1400 06/12/21 0525 06/13/21 0402  WBC 4.3 4.7 4.0  NEUTROABS 2.9  --   --   HGB 12.2 9.4* 9.7*  HCT 34.6* 26.7* 28.1*  MCV 81.4 81.4 81.4  PLT 167 126* 133*   Cardiac Enzymes: No results for input(s): CKTOTAL, CKMB, CKMBINDEX, TROPONINI in the last 168 hours. BNP: Invalid input(s): POCBNP CBG: Recent Labs  Lab 06/14/21 1613 06/14/21 2007 06/14/21 2205 06/15/21 0804 06/15/21 1140  GLUCAP 231* 226* 189* 184* 369*   D-Dimer No results for input(s): DDIMER in the last 72 hours. Hgb A1c No results for input(s): HGBA1C in the last 72 hours. Lipid Profile No results for input(s): CHOL, HDL, LDLCALC, TRIG, CHOLHDL, LDLDIRECT in the last 72 hours. Thyroid function studies No results for input(s): TSH, T4TOTAL, T3FREE, THYROIDAB in the last 72 hours.  Invalid input(s): FREET3 Anemia work up No results for input(s): VITAMINB12, FOLATE, FERRITIN, TIBC, IRON, RETICCTPCT in the last 72 hours. Urinalysis    Component Value Date/Time   COLORURINE COLORLESS (A) 06/11/2021 1505   APPEARANCEUR CLEAR 06/11/2021 1505   LABSPEC 1.002 (L) 06/11/2021 1505   PHURINE 6.0 06/11/2021 1505   GLUCOSEU >=500 (A) 06/11/2021 1505   HGBUR SMALL (A) 06/11/2021 1505   BILIRUBINUR NEGATIVE 06/11/2021 1505   KETONESUR NEGATIVE 06/11/2021 1505   PROTEINUR 30 (A) 06/11/2021 1505   UROBILINOGEN 0.2 09/15/2012 1010   NITRITE NEGATIVE 06/11/2021 1505   LEUKOCYTESUR NEGATIVE 06/11/2021 1505   Sepsis Labs Invalid input(s): PROCALCITONIN,  WBC,  LACTICIDVEN Microbiology Recent Results (from the past 240 hour(s))  Resp Panel by RT-PCR (Flu A&B, Covid) Nasopharyngeal Swab     Status: None   Collection Time: 06/11/21  1:53 PM   Specimen: Nasopharyngeal Swab; Nasopharyngeal(NP) swabs in vial transport medium  Result Value Ref Range Status   SARS Coronavirus 2 by RT PCR NEGATIVE NEGATIVE Final    Comment: (NOTE) SARS-CoV-2 target nucleic acids are NOT DETECTED.  The SARS-CoV-2 RNA is generally  detectable in upper respiratory specimens during the acute phase of infection. The lowest concentration of SARS-CoV-2 viral copies this assay can detect is 138 copies/mL. A negative result does not preclude SARS-Cov-2 infection and should not be used as the sole basis for treatment or other patient management decisions. A negative result may occur with  improper specimen collection/handling, submission of specimen other than nasopharyngeal swab, presence of viral mutation(s) within the areas targeted by this assay, and inadequate number of viral copies(<138 copies/mL). A negative result must be combined with clinical observations, patient history, and epidemiological information. The expected result is Negative.  Fact Sheet for Patients:  EntrepreneurPulse.com.au  Fact Sheet for Healthcare Providers:  IncredibleEmployment.be  This test is no t yet approved  or cleared by the [place] and  has been authorized for detection and/or diagnosis of SARS-CoV-2 by FDA under an Emergency Use Authorization (EUA). This EUA will remain  in effect (meaning this test can be used) for the duration of the COVID-19 declaration under Section 564(b)(1) of the Act, 21 U.S.C.section 360bbb-3(b)(1), unless the authorization is terminated  or revoked sooner.       Influenza A by PCR NEGATIVE NEGATIVE Final   Influenza B by PCR NEGATIVE NEGATIVE Final    Comment: (NOTE) The Xpert Xpress SARS-CoV-2/FLU/RSV plus assay is intended as an aid in the diagnosis of influenza from Nasopharyngeal swab specimens and should not be used as a sole basis for treatment. Nasal washings and aspirates are unacceptable for Xpert Xpress SARS-CoV-2/FLU/RSV testing.  Fact Sheet for Patients: EntrepreneurPulse.com.au  Fact Sheet for Healthcare Providers: IncredibleEmployment.be  This test is not yet approved or cleared by the [place] FDA  and has been authorized for detection and/or diagnosis of SARS-CoV-2 by FDA under an Emergency Use Authorization (EUA). This EUA will remain in effect (meaning this test can be used) for the duration of the COVID-19 declaration under Section 564(b)(1) of the Act, 21 U.S.C. section 360bbb-3(b)(1), unless the authorization is terminated or revoked.  Performed at [facility], [address], [place], [place] [address]   Blood Culture (routine x 2)     Status: None (Preliminary result)   Collection Time: 06/11/21  1:58 PM   Specimen: BLOOD LEFT HAND  Result Value Ref Range Status   Specimen Description   Final    BLOOD LEFT HAND BOTTLES DRAWN AEROBIC AND ANAEROBIC   Special Requests Blood Culture adequate volume  Final   Culture   Final    NO GROWTH 2 DAYS Performed at [facility], [address], [place], [place] [address]    Report Status PENDING  Incomplete  Blood Culture (routine x 2)     Status: None (Preliminary result)   Collection Time: 06/11/21  2:44 PM   Specimen: BLOOD RIGHT HAND  Result Value Ref Range Status   Specimen Description   Final    BLOOD RIGHT HAND BOTTLES DRAWN AEROBIC AND ANAEROBIC   Special Requests Blood Culture adequate volume  Final   Culture   Final    NO GROWTH 2 DAYS Performed at [facility], [address], [place], [place] [address]    Report Status PENDING  Incomplete  Urine Culture     Status: None   Collection Time: 06/11/21  3:05 PM   Specimen: In/Out Cath Urine  Result Value Ref Range Status   Specimen Description   Final    IN/OUT CATH URINE Performed at [facility], [address] [place], [place] [address]    Special Requests   Final    NONE Performed at [facility], [address], [place], [place] [address]    Culture   Final    NO GROWTH Performed at [facility], [place] [address], [place], [place] [address]    Report Status 06/13/2021 FINAL  Final     Time coordinating discharge:  I have spent 35 minutes face to  face with the patient and on the ward discussing the patients care, assessment, plan and disposition with other care givers. >50% of the time was devoted counseling the patient about the risks and benefits of treatment/Discharge disposition and coordinating care.   SIGNED:   Damita Lack, MD  [facility] 06/15/2021, 1:10 PM   If  7PM-7AM, please contact night-coverage

## 2021-06-15 NOTE — Progress Notes (Addendum)
Progress Note  Patient Name: Cassandra Adams Date of Encounter: 06/15/2021  [facility] Cardiologist: [name], MD   Subjective   Had brief nausea last night which resolved with Zofran. Says this commonly occurs at home as well. Feels back to baseline this AM. No chest pain, palpitations or dyspnea.   Inpatient Medications    Scheduled Meds:  apixaban  5 mg Oral BID   aspirin EC  81 mg Oral Daily   atorvastatin  80 mg Oral QPM   colesevelam  1,875 mg Oral BID WC   diclofenac Sodium  2 g Topical QID   dronedarone  400 mg Oral BID WC   hydrALAZINE  50 mg Oral Q8H   insulin aspart  0-15 Units Subcutaneous TID WC   insulin aspart  0-5 Units Subcutaneous QHS   insulin glargine-yfgn  15 Units Subcutaneous Daily   lisinopril  20 mg Oral Daily   pantoprazole  40 mg Oral BID   polyethylene glycol  17 g Oral Daily   Continuous Infusions:  PRN Meds: acetaminophen **OR** acetaminophen, guaiFENesin, hydrALAZINE, metoprolol tartrate, ondansetron **OR** ondansetron (ZOFRAN) IV, senna-docusate, traZODone   Vital Signs    Vitals:   06/14/21 1400 06/14/21 2006 06/15/21 0424 06/15/21 0554  BP: (!) 149/51 (!) 150/62 (!) 171/57 (!) 179/62  Pulse: 64 (!) 56 (!) 50 60  Resp:  20 20 18   Temp:  98 F (36.7 C) 98.3 F (36.8 C) 97.9 F (36.6 C)  TempSrc:    Oral  SpO2:  100% 98% 96%  Weight:      Height:        Intake/Output Summary (Last 24 hours) at 06/15/2021 0943 Last data filed at 06/14/2021 2300 Gross per 24 hour  Intake 1460 ml  Output --  Net 1460 ml   Last 3 Weights 06/11/2021 03/15/2021 02/16/2021  Weight (lbs) 173 lb 165 lb 9.6 oz 176 lb  Weight (kg) 78.472 kg 75.116 kg 79.833 kg      Telemetry    NSR, HR in 50's to 60's with occasional PVC's.  - Personally Reviewed  ECG    NSR, HR 63 with slight ST depression along the inferior leads, V5 and V6.  - Personally Reviewed  Physical Exam   GEN: Pleasant female appearing in no acute distress.    Neck: No JVD Cardiac: RRR, no murmurs, rubs, or gallops.  Respiratory: Clear to auscultation bilaterally. GI: Soft, nontender, non-distended  MS: No pitting edema; No deformity. Neuro:  Nonfocal  Psych: Normal affect   Labs    High Sensitivity Troponin:   Recent Labs  Lab 06/13/21 1256 06/13/21 1443  TROPONINIHS 29* 23*     Chemistry Recent Labs  Lab 06/11/21 1400 06/12/21 0525 06/13/21 0402 06/14/21 0909  NA 134* 140 140 136  K 3.9 4.1 3.9 4.9  CL 99 109 108 104  CO2 27 26 27 28   GLUCOSE 207* 163* 74 288*  BUN 27* 25* 23 19  CREATININE 1.33* 1.20* 1.40* 1.26*  CALCIUM 10.6* 9.1 9.1 9.2  PROT 8.7*  --   --   --   ALBUMIN 4.7  --   --   --   AST 39  --   --   --   ALT 24  --   --   --   ALKPHOS 75  --   --   --   BILITOT 0.5  --   --   --   GFRNONAA 41* 47* 39* 44*  ANIONGAP 8 5 5  4*    Lipids No results for input(s): CHOL, TRIG, HDL, LABVLDL, LDLCALC, CHOLHDL in the last 168 hours.  Hematology Recent Labs  Lab 06/11/21 1400 06/12/21 0525 06/13/21 0402  WBC 4.3 4.7 4.0  RBC 4.25 3.28* 3.45*  HGB 12.2 9.4* 9.7*  HCT 34.6* 26.7* 28.1*  MCV 81.4 81.4 81.4  MCH 28.7 28.7 28.1  MCHC 35.3 35.2 34.5  RDW 15.3 15.0 15.2  PLT 167 126* 133*   Thyroid  Recent Labs  Lab 06/11/21 1444  TSH 0.500    BNP Recent Labs  Lab 06/11/21 1400  BNP 275.0*    DDimer No results for input(s): DDIMER in the last 168 hours.   Radiology     Cardiac Studies   Echocardiogram: 06/13/2021 IMPRESSIONS     1. Inferior basal hypokinesis . Left ventricular ejection fraction, by  estimation, is 55%. The left ventricle has normal function. The left  ventricle demonstrates regional wall motion abnormalities (see scoring  diagram/findings for description). There  is severe asymmetric left ventricular hypertrophy of the basal and septal  segments. Left ventricular diastolic parameters were normal.   2. Right ventricular systolic function is normal. The right ventricular   size is normal. There is mildly elevated pulmonary artery systolic  pressure.   3. Left atrial size was moderately dilated.   4. The mitral valve is abnormal. Mild mitral valve regurgitation. No  evidence of mitral stenosis.   5. The aortic valve is tricuspid. There is mild calcification of the  aortic valve. There is mild thickening of the aortic valve. Aortic valve  regurgitation is not visualized. Aortic valve sclerosis/calcification is  present, without any evidence of  aortic stenosis.   6. The inferior vena cava is normal in size with greater than 50%  respiratory variability, suggesting right atrial pressure of 3 mmHg.   Patient Profile     75 y.o. female w/ PMH of CAD (s/p NSTEMI in 7/15 with CTO of the RCA and moderate LAD disease with medical management recommended), HFpEF, paroxysmal atrial fibrillation, prior TIA, CKD, HTN, HLD, Type 2 DM and prior upper GI bleed who presented to [facility] ED on 06/11/2021 for evaluation of confusion and Cardiology consulted for bradycardia.   Assessment & Plan    1. Paroxysmal Atrial Fibrillation complicated by Tachy-brady Syndrome - HR was in the 30's per EMS report and initially in the 40's to 50's while in the ED. Did have ventricular bigeminy at times and has experienced episodes of atrial fibrillation this admission with HR in the 120's. Currently in NSR with HR in the 50's. Was on Coreg 6.25mg  BID prior to admission which has now been discontinued. - Dr. Harrington Challenger previously recommended starting Multaq 400mg  BID and will order for this AM. Continue Eliquis 5mg  BID for anticoagulation.   2. CAD - She had an NSTEMI in 2015 with cath at that time showing an occluded RCA and moderate disease along the LAD.  - No recent anginal symptoms and Hs Troponin values were flat at 29 and 23 this admission. Echo shows her EF at 55% with basal inferior hypokinesis which would correlate with her known CTO of the RCA.  - Continue ASA (would review with  primary MD as an outpatient whether to continue or not given the use of anticoagulation) and statin therapy. BB discontinued due to bradycardia.   3. HFpEF - Echo this admission shows her EF remains preserved at 55%. She was on Lasix 40mg  daily prior to  admission but given concerns for dehydration at that time, may need to reduce to 20mg  daily at discharge with instructions to take an extra 20mg  if needed for weight gain or edema.   4. HTN - BP elevated to 149/51 - 179/62 within the past 24 hours. She has been continued on PTA Lisinopril 20mg  daily and Hydralazine has been initiated with titration to 50mg  TID. Can consider further adjustment to 75mg  TID later today pending her trend.   5. Stage 3 CKD - Creatinine at 1.26 this AM which is close to her baseline.   For questions or updates, please contact [place] Please consult [email or web address] for contact info under         Signed, Erma Heritage, PA-C  06/15/2021, 9:43 AM    Patient seen and exmained   I agree with findings as noted above by B Strader Pt complains of a HA   No dizziness ON exam Neck:  JVP is normal Lungs are CTA Cardiac exam:  RRR  no S3    Ext are without edema  TEle with SR  Recom:   Pt just started Multaq this am WOuld continue    Will set up for monitor to watch for recurrence of afib on Multaq OK to d/c   Will arrange for f/u as outpatient  Dorris Carnes MD

## 2021-06-16 LAB — CULTURE, BLOOD (ROUTINE X 2)
Culture: NO GROWTH
Culture: NO GROWTH
Special Requests: ADEQUATE
Special Requests: ADEQUATE

## 2021-06-22 ENCOUNTER — Ambulatory Visit: Payer: Medicare HMO | Admitting: Nurse Practitioner

## 2021-06-22 NOTE — Patient Instructions (Incomplete)

## 2021-06-29 DIAGNOSIS — I2583 Coronary atherosclerosis due to lipid rich plaque: Secondary | ICD-10-CM | POA: Diagnosis not present

## 2021-06-29 DIAGNOSIS — I4891 Unspecified atrial fibrillation: Secondary | ICD-10-CM | POA: Diagnosis not present

## 2021-06-29 DIAGNOSIS — Z6828 Body mass index (BMI) 28.0-28.9, adult: Secondary | ICD-10-CM | POA: Diagnosis not present

## 2021-06-29 DIAGNOSIS — E663 Overweight: Secondary | ICD-10-CM | POA: Diagnosis not present

## 2021-06-29 DIAGNOSIS — E1129 Type 2 diabetes mellitus with other diabetic kidney complication: Secondary | ICD-10-CM | POA: Diagnosis not present

## 2021-06-29 DIAGNOSIS — I2510 Atherosclerotic heart disease of native coronary artery without angina pectoris: Secondary | ICD-10-CM | POA: Diagnosis not present

## 2021-06-29 DIAGNOSIS — E118 Type 2 diabetes mellitus with unspecified complications: Secondary | ICD-10-CM | POA: Diagnosis not present

## 2021-06-30 ENCOUNTER — Other Ambulatory Visit: Payer: Self-pay

## 2021-06-30 ENCOUNTER — Ambulatory Visit: Payer: Medicare HMO | Admitting: Internal Medicine

## 2021-06-30 ENCOUNTER — Encounter: Payer: Self-pay | Admitting: Internal Medicine

## 2021-06-30 VITALS — BP 136/52 | HR 62 | Ht 66.0 in | Wt 176.0 lb

## 2021-06-30 DIAGNOSIS — E1122 Type 2 diabetes mellitus with diabetic chronic kidney disease: Secondary | ICD-10-CM

## 2021-06-30 DIAGNOSIS — I480 Paroxysmal atrial fibrillation: Secondary | ICD-10-CM | POA: Diagnosis not present

## 2021-06-30 DIAGNOSIS — N183 Chronic kidney disease, stage 3 unspecified: Secondary | ICD-10-CM | POA: Diagnosis not present

## 2021-06-30 DIAGNOSIS — I5033 Acute on chronic diastolic (congestive) heart failure: Secondary | ICD-10-CM

## 2021-06-30 DIAGNOSIS — I2510 Atherosclerotic heart disease of native coronary artery without angina pectoris: Secondary | ICD-10-CM | POA: Diagnosis not present

## 2021-06-30 NOTE — Patient Instructions (Signed)
Medication Instructions:  STOP aspirin   INCREASE lasix to 40mg  twice daily for 4 days Call on Monday January 9 with an update on daily weights   *If you need a refill on your cardiac medications before your next appointment, please call your pharmacy*  Testing/Procedures: We will call about monitor results   Follow-Up: At [facility], you and your health needs are our priority.  As part of our continuing mission to provide you with exceptional heart care, we have created designated Provider Care Teams.  These Care Teams include your primary Cardiologist (physician) and Advanced Practice Providers (APPs -  Physician Assistants and Nurse Practitioners) who all work together to provide you with the care you need, when you need it.  We recommend signing up for the patient portal called "MyChart".  Sign up information is provided on this After Visit Summary.  MyChart is used to connect with patients for Virtual Visits (Telemedicine).  Patients are able to view lab/test results, encounter notes, upcoming appointments, etc.  Non-urgent messages can be sent to your provider as well.   To learn more about what you can do with MyChart, go to [email or web address].    Your next appointment:   6 month(s)  The format for your next appointment:   In Person  Provider:   [name], MD {

## 2021-06-30 NOTE — Progress Notes (Signed)
06/30/2021 Cassandra Adams   23-Apr-1946  [id number]  Primary Physicia Sharilyn Sites, MD Primary Cardiologist: Dr. Debara Pickett  CC: Routine follow-up  HPI:  The patient is a 76 year old African American female with a history of diabetes, hypertension, prior stroke but no prior history of CAD, who initially presented to [facility] on 01/03/2014 with a complaint of chest pain. Initial troponin was elevated at 15.7 and EKG was with nonspecific ST-T changes. She was placed on IV heparin and IV nitroglycerin and was transferred to [facility] for further treatment. Her chest pain resolved. She underwent a diagnostic left heart catheterization, perform by Dr. Gwenlyn Found which demonstrated occlusion of the midportion of the RCA, which was felt to be the infarct-related artery. However, she had grade 3 left right collaterals. She also has moderate LAD artery disease with 50% segmental stenosis in the proximal portion with a focal area of 60-70% stenosis. The left main and left circumflex were both normal. Left ventricular systolic function was normal with an estimated ejection fraction of 60%. Wall motion abnormalities were notable for sublte inferior basal hypokinesia. Given her lack of ongoing symptoms and preserved LV function with excellent collaterals, it was recommended to treat with medical therapy. It was felt that if she were to develop recurrent chest pain then she would be a candidate for PCI and stenting of her RCA. She left the Cath Lab in stable condition and had no post-cath complications and no recurrent chest pain. She was placed on dual antiplatelet therapy with aspirin plus Plavix. She was also placed on a beta blocker, an ACE inhibitor and statin. Also notable this admission, was a hemoglobin A1c level of 8.1. She was instructed to followup with her PCP for better management of her diabetes.  She was discharged home on 01/06/2014.  She presents to clinic today for post hospital  followup. She is accompanied by her daughter. She denies any recurrent anginal symptoms. She also denies dyspnea, syncope/near-syncope. Yesterday however, she did note feeling a sensation of increased warmth and mild dizziness, that was self-limiting. This lasted only a minute or 2. Again, she had no syncope/near-syncope. She denies any further recurrence. She has been fully compliant with her medications.  Cassandra Adams is seen in followup today. Her EKG demonstrates a sinus rhythm today. However I suspect that she continues to have PAF. Unfortunate she took herself off of Xarelto after experiencing some oral bleeding. She did not notify the office and has not been on any anticoagulation for several months. We discussed her higher than normal risk of stroke, based on a CHADSVASC score of 5. She reports only one episode of angina in January, however it was very short-lived and sharp pain. She took a nitroglycerin and the symptoms went away after 10 or 15 minutes, suggesting this may not eventually been angina.  I saw Cassandra Adams today back in the office. Overall she is feeling well. She denies any chest pain or shortness of breath. She's not had any further episodes of A. fib. She is noted to be mildly bradycardic today. Blood pressure has run somewhat high for a while.  02/18/2016  [name] returns today for follow-up. She reports she's been having some episodes of what sounds like night sweats. She wakes up at night fairly soaked. These don't happen every night and is been no associated weight loss, change in appetite or other constitutional symptoms. She reports she is long past her menopause symptoms. She continues to have morning hypoglycemia with  blood sugars in the 40s. I previously asked to decrease her Lantus insulin. She says that her endocrinologist is aware of this and she has an appointment with him in September. She is not checking her sugars in the middle the night but I'm suspicious of  hypoglycemia. Other possible causes would include autonomic dysfunction, she does have gastroparesis and peripheral neuropathy. In addition she could be having other symptoms such as reflux or other things which cause vagal stimulation and diaphoresis. This is very atypical for coronary disease and there is no associated chest pain or worsening shortness of breath.  03/30/2016  Cassandra Adams was seen back today for follow-up. She reports her hot flashes have resolved. Unfortunately even though we decreased her insulin, she had some recurrent hypoglycemia and ended up in the emergency department. She is now having problems with hyperglycemia. 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 She denies any chest pain or worsening shortness of breath. She does have some chronic lower extremity edema which is likely from neuropathy. I've recommended bilateral knee-high 20-30 mmHg compression stockings.  11/24/2016  Cassandra Adams returns for follow-up. She reported December she had to take one nitroglycerin. She was very active that day and it seemed to resolve her symptoms fairly quickly. She's not required any more nitroglycerin. She is asking for refills of that today. She continues to have problems with hypoglycemia although does seem she has awareness. Recently she had a few episodes of spontaneous diaphoresis. This was not associated with any chest pain and testing of her blood sugar indicated hypoglycemia so I suspect that this is more hypoglycemia awareness. She reports some lower strandy swelling which we discussed in the past would be amenable to compression stockings. She is on daily Lasix. Blood pressure was initially elevated 158/62 of her came out a 134/60. She has managed to lose 10 pounds since I last saw her which I commended her on as well.  11/26/2017  Cassandra Adams returns today for routine follow-up.  Overall she is doing well.   She has not required any nitroglycerin since I last saw her.  She denies any bleeding problems on Eliquis.  She is out of some medications including atorvastatin and nearly out of Eliquis.  She also lost her nitroglycerin and needs a refill.  She reports reasonably good blood sugar control.  Her blood pressure has remained elevated.  Recently her PCP started her on losartan, however she ready was on lisinopril.  She is not on any additional blood pressure medications.  Blood pressure today was 169/82.  Given her history of coronary disease, she would be a good candidate for beta-blocker.  EKG shows sinus rhythm with PVCs in fact today and nonspecific T wave changes at 80.  07/03/2019  Cassandra Adams returns today for follow-up.  Over the past year she has done well.  She denies any chest pain or worsening shortness of breath.  Blood pressure was very elevated today at 208/98 however she was rushed to get in the office and was upset that her daughter could not come back with her.  I repeated her blood pressure eventually did come down to 132/68.  EKG shows sinus rhythm with some PACs today.  Recent labs from November showed total cholesterol 128, HDL 51, LDL 57 triglycerides 99.  She is working with Dr. Dorris Fetch regarding her diabetes and her hemoglobin A1c is still elevated 8.3.  06/30/2021  Cassandra Adams was seen today for  hospital follow-up.  She was admitted at [facility] for bradycardia cardia and confusion.  Her carvedilol was held.  Troponins were flat minimally elevated.  She was noted to have some PAF and started on Multaq.  Cardiology was consulted.  Cardiogram was performed which showed inferobasal hypokinesis with EF 55%.  There is severe asymmetric hypertrophy of the basal septal segments.  Left atrium was moderately dilated.  She had no chest pain with this.  She was placed on a 2-week ZIO monitor which is due today.  She reports no recurrent symptomatic atrial fibrillation.  She has noted some swelling  and today has a headache.  Weight is 176 pounds up from 165 pounds previously.  I believe her discharge weight was around 171 pounds.   Current Outpatient Medications  Medication Sig Dispense Refill   aspirin EC 81 MG tablet Take 1 tablet (81 mg total) by mouth daily. 30 tablet 11   atorvastatin (LIPITOR) 80 MG tablet Take 1 tablet (80 mg total) by mouth every evening. 90 tablet 3   BD INSULIN SYRINGE U/F 31G X 5/16" 0.5 ML MISC Use to take with Novolog TIDAC 100 each 5   colesevelam (WELCHOL) 625 MG tablet Take 1,875 mg by mouth 2 (two) times daily with a meal. Noon and night     Continuous Blood Gluc Receiver (FREESTYLE LIBRE 2 READER) DEVI 1 each by Does not apply route daily in the afternoon. 1 each 0   Continuous Blood Gluc Sensor (FREESTYLE LIBRE 2 SENSOR) MISC 1 each by Does not apply route daily in the afternoon. 1 each 0   dronedarone (MULTAQ) 400 MG tablet Take 1 tablet (400 mg total) by mouth 2 (two) times daily with a meal. 60 tablet 0   ELIQUIS 5 MG TABS tablet Take 1 tablet (5 mg total) by mouth 2 (two) times daily. 180 tablet 1   furosemide (LASIX) 40 MG tablet Take 40 mg  one tablet by mouth daily , may take an additional 40 mg if needed daily if leg edema worsens 180 tablet 3   hydrALAZINE (APRESOLINE) 50 MG tablet Take 1 tablet (50 mg total) by mouth 3 (three) times daily. (Patient taking differently: Take 25 mg by mouth 3 (three) times daily.) 270 tablet 1   Insulin Glargine (BASAGLAR KWIKPEN) 100 UNIT/ML INJECT 32 UNITS INTO THE SKIN AT BEDTIME. 30 mL 0   lisinopril (ZESTRIL) 20 MG tablet Take 1 tablet (20 mg total) by mouth daily. 90 tablet 3   nitroGLYCERIN (NITROSTAT) 0.4 MG SL tablet PLACE 1 TABLET UNDER THE TONGUE EVERY 5 MINUTES AS NEEDED FOR CHEST PAIN (UP TO 3 DOSES) (Patient taking differently: Place 0.4 mg under the tongue every 5 (five) minutes as needed.) 75 tablet 1   NOVOLOG 100 UNIT/ML injection Inject 6-9 Units into the skin 3 (three) times daily with meals.  Sliding Scale     polyethylene glycol (MIRALAX / GLYCOLAX) 17 g packet Take 17 g by mouth daily. 14 each 0   senna-docusate (SENOKOT-S) 8.6-50 MG tablet Take 1 tablet by mouth at bedtime as needed for mild constipation. 30 tablet 0   Vitamin D, Cholecalciferol, 25 MCG (1000 UT) TABS Take 2,000 Units by mouth every other day.     pantoprazole (PROTONIX) 40 MG tablet Take 1 tablet (40 mg total) by mouth 2 (two) times daily. 168 tablet 0   No current facility-administered medications for this visit.    Allergies  Allergen Reactions   Penicillins Hives    Has  patient had a PCN reaction causing immediate rash, facial/tongue/throat swelling, SOB or lightheadedness with hypotension: no Has patient had a PCN reaction causing severe rash involving mucus membranes or skin necrosis: No  Has patient had a PCN reaction that required hospitalization: no Has patient had a PCN reaction occurring within the last 10 years: no If all of the above answers are "NO", then may proceed with Cephalosporin use.     Social History   Socioeconomic History   Marital status: Single    Spouse name: Not on file   Number of children: Not on file   Years of education: Not on file   Highest education level: Not on file  Occupational History   Not on file  Tobacco Use   Smoking status: Never   Smokeless tobacco: Never  Vaping Use   Vaping Use: Never used  Substance and Sexual Activity   Alcohol use: No   Drug use: No   Sexual activity: Not Currently    Birth control/protection: Post-menopausal  Other Topics Concern   Not on file  Social History Narrative   Not on file   Social Determinants of Health   Financial Resource Strain: Not on file  Food Insecurity: Not on file  Transportation Needs: Not on file  Physical Activity: Not on file  Stress: Not on file  Social Connections: Not on file  Intimate Partner Violence: Not on file     Review of Systems: Pertinent items noted in HPI and remainder of  comprehensive ROS otherwise negative.   Blood pressure (!) 136/52, pulse 62, height 5\' 6"  (1.676 m), weight 176 lb (79.8 kg).  General appearance: alert, no distress and mildly obese Neck: no carotid bruit and no JVD Lungs: clear to auscultation bilaterally Heart: Regular bradycardia Abdomen: soft, non-tender; bowel sounds normal; no masses,  no organomegaly Extremities: edema 1+ pedal Pulses: 2+ and symmetric Skin: Skin color, texture, turgor normal. No rashes or lesions Neurologic: Grossly normal Psych: Pleasant  EKG Normal sinus rhythm, poor R wave progression anteriorly -personally reviewed  PROBLEM LIST: Possible symptomatic bradycardia CAD status post PCI to the RCA in 2015 PAF - CHADSVASC score of 5 on Eliquis Hypertension Dyslipidemia IDDM with neuropathy Carotid artery stenosis/occlusion - mild 2016 Leg edema  ASSESSMENT AND PLAN:  Mrs. Robotham was admitted with possible symptomatic bradycardia.  She was taken off of carvedilol and placed on Multaq because of history of PAF and concern for tachybradycardia syndrome.  She was placed on a 2-week monitor which will be returned today.  I will review that.  EKG shows normal sinus rhythm today.  Blood pressure appears well controlled.  I do think she is a little volume overloaded.  She has some lower extremity edema.  Weight is up to 176 pounds.  I advised increasing her Lasix to 40 mg twice daily for 4 days and then back to 40 mg daily.  I will contact her with the results of her monitor.  As she is on Eliquis and has been on aspirin but has stage III chronic kidney disease, we will go ahead and stop the aspirin without evidence of any recurrent chest pain or MI.  Echo shows stable LV function.  She is advised to contact me if her weight continues to climb.  She is also been enrolled with home scale and blood pressure monitoring through insurance.  Follow-up with me in 6 months or sooner as necessary.  [name], MD,  [facility], [place]  [facility] Diplomate of the American Board of Clinical Lipidology Attending Cardiologist  Direct Dial: [phone]   Fax: [phone]  Website:  [email or web address] 06/30/2021 8:38 AM

## 2021-07-01 ENCOUNTER — Other Ambulatory Visit (HOSPITAL_COMMUNITY): Payer: Self-pay

## 2021-07-05 ENCOUNTER — Telehealth: Payer: Self-pay | Admitting: Internal Medicine

## 2021-07-05 NOTE — Telephone Encounter (Signed)
Patient reports weight of 174 lbs.Minimal swelling and she feels good.   I will FYI Dr.Hilty

## 2021-07-05 NOTE — Telephone Encounter (Signed)
Patient is calling to provide an update for Dr. Debara Pickett. She states her swelling went down tremendously over the weekend and she does not have any swelling at all today.

## 2021-07-06 DIAGNOSIS — I4819 Other persistent atrial fibrillation: Secondary | ICD-10-CM | POA: Diagnosis not present

## 2021-07-26 DIAGNOSIS — E1140 Type 2 diabetes mellitus with diabetic neuropathy, unspecified: Secondary | ICD-10-CM | POA: Diagnosis not present

## 2021-07-26 DIAGNOSIS — I10 Essential (primary) hypertension: Secondary | ICD-10-CM | POA: Diagnosis not present

## 2021-07-26 DIAGNOSIS — K219 Gastro-esophageal reflux disease without esophagitis: Secondary | ICD-10-CM | POA: Diagnosis not present

## 2021-07-27 DIAGNOSIS — E1129 Type 2 diabetes mellitus with other diabetic kidney complication: Secondary | ICD-10-CM | POA: Diagnosis not present

## 2021-07-27 DIAGNOSIS — I2583 Coronary atherosclerosis due to lipid rich plaque: Secondary | ICD-10-CM | POA: Diagnosis not present

## 2021-07-28 ENCOUNTER — Ambulatory Visit (HOSPITAL_COMMUNITY)
Admission: RE | Admit: 2021-07-28 | Discharge: 2021-07-28 | Disposition: A | Discharge status: Home / Self Care | Payer: Medicare HMO | Source: Ambulatory Visit | Attending: Cardiovascular Disease | Admitting: Cardiovascular Disease

## 2021-07-28 ENCOUNTER — Encounter (HOSPITAL_COMMUNITY): Payer: Medicare HMO

## 2021-07-28 ENCOUNTER — Other Ambulatory Visit: Payer: Self-pay

## 2021-07-28 DIAGNOSIS — I779 Disorder of arteries and arterioles, unspecified: Secondary | ICD-10-CM | POA: Diagnosis not present

## 2021-07-28 DIAGNOSIS — Z8673 Personal history of transient ischemic attack (TIA), and cerebral infarction without residual deficits: Secondary | ICD-10-CM | POA: Diagnosis not present

## 2021-07-28 DIAGNOSIS — Z9889 Other specified postprocedural states: Secondary | ICD-10-CM | POA: Diagnosis not present

## 2021-08-02 ENCOUNTER — Other Ambulatory Visit: Payer: Self-pay

## 2021-08-02 ENCOUNTER — Inpatient Hospital Stay (HOSPITAL_COMMUNITY)
Admission: EM | Admit: 2021-08-02 | Discharge: 2021-08-07 | DRG: 247 | Disposition: A | Discharge status: Home / Self Care | Payer: Medicare HMO | Attending: Internal Medicine | Admitting: Internal Medicine

## 2021-08-02 ENCOUNTER — Encounter (HOSPITAL_COMMUNITY): Payer: Self-pay | Admitting: Emergency Medicine

## 2021-08-02 ENCOUNTER — Emergency Department (HOSPITAL_COMMUNITY): Payer: Medicare HMO

## 2021-08-02 DIAGNOSIS — I214 Non-ST elevation (NSTEMI) myocardial infarction: Secondary | ICD-10-CM | POA: Diagnosis not present

## 2021-08-02 DIAGNOSIS — D573 Sickle-cell trait: Secondary | ICD-10-CM | POA: Diagnosis present

## 2021-08-02 DIAGNOSIS — I130 Hypertensive heart and chronic kidney disease with heart failure and stage 1 through stage 4 chronic kidney disease, or unspecified chronic kidney disease: Secondary | ICD-10-CM | POA: Diagnosis present

## 2021-08-02 DIAGNOSIS — I480 Paroxysmal atrial fibrillation: Secondary | ICD-10-CM | POA: Diagnosis present

## 2021-08-02 DIAGNOSIS — I2510 Atherosclerotic heart disease of native coronary artery without angina pectoris: Secondary | ICD-10-CM | POA: Diagnosis present

## 2021-08-02 DIAGNOSIS — R0689 Other abnormalities of breathing: Secondary | ICD-10-CM | POA: Diagnosis not present

## 2021-08-02 DIAGNOSIS — D509 Iron deficiency anemia, unspecified: Secondary | ICD-10-CM | POA: Diagnosis present

## 2021-08-02 DIAGNOSIS — R079 Chest pain, unspecified: Secondary | ICD-10-CM

## 2021-08-02 DIAGNOSIS — E11649 Type 2 diabetes mellitus with hypoglycemia without coma: Secondary | ICD-10-CM | POA: Diagnosis not present

## 2021-08-02 DIAGNOSIS — N183 Chronic kidney disease, stage 3 unspecified: Secondary | ICD-10-CM | POA: Diagnosis not present

## 2021-08-02 DIAGNOSIS — M159 Polyosteoarthritis, unspecified: Secondary | ICD-10-CM | POA: Diagnosis present

## 2021-08-02 DIAGNOSIS — I252 Old myocardial infarction: Secondary | ICD-10-CM

## 2021-08-02 DIAGNOSIS — E785 Hyperlipidemia, unspecified: Secondary | ICD-10-CM | POA: Diagnosis present

## 2021-08-02 DIAGNOSIS — I10 Essential (primary) hypertension: Secondary | ICD-10-CM | POA: Diagnosis present

## 2021-08-02 DIAGNOSIS — N1832 Chronic kidney disease, stage 3b: Secondary | ICD-10-CM | POA: Diagnosis not present

## 2021-08-02 DIAGNOSIS — E1122 Type 2 diabetes mellitus with diabetic chronic kidney disease: Secondary | ICD-10-CM | POA: Diagnosis present

## 2021-08-02 DIAGNOSIS — Z96651 Presence of right artificial knee joint: Secondary | ICD-10-CM | POA: Diagnosis present

## 2021-08-02 DIAGNOSIS — Z8249 Family history of ischemic heart disease and other diseases of the circulatory system: Secondary | ICD-10-CM

## 2021-08-02 DIAGNOSIS — E861 Hypovolemia: Secondary | ICD-10-CM | POA: Diagnosis not present

## 2021-08-02 DIAGNOSIS — I2582 Chronic total occlusion of coronary artery: Secondary | ICD-10-CM | POA: Diagnosis present

## 2021-08-02 DIAGNOSIS — Z8673 Personal history of transient ischemic attack (TIA), and cerebral infarction without residual deficits: Secondary | ICD-10-CM

## 2021-08-02 DIAGNOSIS — I495 Sick sinus syndrome: Secondary | ICD-10-CM | POA: Diagnosis not present

## 2021-08-02 DIAGNOSIS — E1140 Type 2 diabetes mellitus with diabetic neuropathy, unspecified: Secondary | ICD-10-CM | POA: Diagnosis present

## 2021-08-02 DIAGNOSIS — Z7901 Long term (current) use of anticoagulants: Secondary | ICD-10-CM

## 2021-08-02 DIAGNOSIS — R519 Headache, unspecified: Secondary | ICD-10-CM | POA: Diagnosis not present

## 2021-08-02 DIAGNOSIS — Z7984 Long term (current) use of oral hypoglycemic drugs: Secondary | ICD-10-CM

## 2021-08-02 DIAGNOSIS — I482 Chronic atrial fibrillation, unspecified: Secondary | ICD-10-CM | POA: Diagnosis present

## 2021-08-02 DIAGNOSIS — I700 Atherosclerosis of aorta: Secondary | ICD-10-CM | POA: Diagnosis present

## 2021-08-02 DIAGNOSIS — Z79899 Other long term (current) drug therapy: Secondary | ICD-10-CM

## 2021-08-02 DIAGNOSIS — N179 Acute kidney failure, unspecified: Secondary | ICD-10-CM | POA: Diagnosis present

## 2021-08-02 DIAGNOSIS — R072 Precordial pain: Secondary | ICD-10-CM | POA: Diagnosis not present

## 2021-08-02 DIAGNOSIS — Z743 Need for continuous supervision: Secondary | ICD-10-CM | POA: Diagnosis not present

## 2021-08-02 DIAGNOSIS — I959 Hypotension, unspecified: Secondary | ICD-10-CM | POA: Diagnosis present

## 2021-08-02 DIAGNOSIS — I9589 Other hypotension: Secondary | ICD-10-CM

## 2021-08-02 DIAGNOSIS — Z833 Family history of diabetes mellitus: Secondary | ICD-10-CM

## 2021-08-02 DIAGNOSIS — Z794 Long term (current) use of insulin: Secondary | ICD-10-CM

## 2021-08-02 DIAGNOSIS — Z83438 Family history of other disorder of lipoprotein metabolism and other lipidemia: Secondary | ICD-10-CM

## 2021-08-02 DIAGNOSIS — Z20822 Contact with and (suspected) exposure to covid-19: Secondary | ICD-10-CM | POA: Diagnosis present

## 2021-08-02 DIAGNOSIS — Z880 Allergy status to penicillin: Secondary | ICD-10-CM

## 2021-08-02 DIAGNOSIS — I5032 Chronic diastolic (congestive) heart failure: Secondary | ICD-10-CM | POA: Diagnosis present

## 2021-08-02 DIAGNOSIS — R0789 Other chest pain: Secondary | ICD-10-CM | POA: Diagnosis not present

## 2021-08-02 DIAGNOSIS — R000 Tachycardia, unspecified: Secondary | ICD-10-CM | POA: Diagnosis not present

## 2021-08-02 DIAGNOSIS — I6521 Occlusion and stenosis of right carotid artery: Secondary | ICD-10-CM | POA: Diagnosis present

## 2021-08-02 LAB — CBC
HCT: 33.1 % — ABNORMAL LOW (ref 36.0–46.0)
Hemoglobin: 11.5 g/dL — ABNORMAL LOW (ref 12.0–15.0)
MCH: 28.1 pg (ref 26.0–34.0)
MCHC: 34.7 g/dL (ref 30.0–36.0)
MCV: 80.9 fL (ref 80.0–100.0)
Platelets: 164 10*3/uL (ref 150–400)
RBC: 4.09 MIL/uL (ref 3.87–5.11)
RDW: 14.6 % (ref 11.5–15.5)
WBC: 3.5 10*3/uL — ABNORMAL LOW (ref 4.0–10.5)
nRBC: 0 % (ref 0.0–0.2)

## 2021-08-02 LAB — URINALYSIS, ROUTINE W REFLEX MICROSCOPIC
Bacteria, UA: NONE SEEN
Bilirubin Urine: NEGATIVE
Glucose, UA: >=500 mg/dL — AB
Hgb urine dipstick: NEGATIVE
Ketones, ur: NEGATIVE mg/dL
Leukocytes,Ua: NEGATIVE
Nitrite: NEGATIVE
Protein, ur: NEGATIVE mg/dL
Specific Gravity, Urine: 1.008 (ref 1.005–1.030)
pH: 5 (ref 5.0–8.0)

## 2021-08-02 LAB — COMPREHENSIVE METABOLIC PANEL
ALT: 18 U/L (ref 0–44)
AST: 24 U/L (ref 15–41)
Albumin: 3.3 g/dL — ABNORMAL LOW (ref 3.5–5.0)
Alkaline Phosphatase: 52 U/L (ref 38–126)
Anion gap: 12 (ref 5–15)
BUN: 41 mg/dL — ABNORMAL HIGH (ref 8–23)
CO2: 20 mmol/L — ABNORMAL LOW (ref 22–32)
Calcium: 9.2 mg/dL (ref 8.9–10.3)
Chloride: 101 mmol/L (ref 98–111)
Creatinine, Ser: 1.71 mg/dL — ABNORMAL HIGH (ref 0.44–1.00)
GFR, Estimated: 31 mL/min — ABNORMAL LOW (ref >60)
Glucose, Bld: 362 mg/dL — ABNORMAL HIGH (ref 70–99)
Potassium: 4 mmol/L (ref 3.5–5.1)
Sodium: 133 mmol/L — ABNORMAL LOW (ref 135–145)
Total Bilirubin: 0.5 mg/dL (ref 0.3–1.2)
Total Protein: 6 g/dL — ABNORMAL LOW (ref 6.5–8.1)

## 2021-08-02 LAB — RESP PANEL BY RT-PCR (FLU A&B, COVID) ARPGX2
Influenza A by PCR: NEGATIVE
Influenza B by PCR: NEGATIVE
SARS Coronavirus 2 by RT PCR: NEGATIVE

## 2021-08-02 LAB — TROPONIN I (HIGH SENSITIVITY)
Troponin I (High Sensitivity): 36 ng/L — ABNORMAL HIGH (ref <18)
Troponin I (High Sensitivity): 127 ng/L (ref <18)
Troponin I (High Sensitivity): 1022 ng/L (ref <18)

## 2021-08-02 LAB — CBG MONITORING, ED: Glucose-Capillary: 357 mg/dL — ABNORMAL HIGH (ref 70–99)

## 2021-08-02 LAB — BRAIN NATRIURETIC PEPTIDE: B Natriuretic Peptide: 95 pg/mL (ref 0.0–100.0)

## 2021-08-02 LAB — GLUCOSE, CAPILLARY: Glucose-Capillary: 288 mg/dL — ABNORMAL HIGH (ref 70–99)

## 2021-08-02 MED ORDER — ACETAMINOPHEN 325 MG PO TABS
650.0000 mg | ORAL_TABLET | Freq: Four times a day (QID) | ORAL | Status: DC | PRN
  Administered 2021-08-05 (×2): 650 mg via ORAL
  Filled 2021-08-02 (×2): qty 2

## 2021-08-02 MED ORDER — ASPIRIN 81 MG PO CHEW: 324.0000 mg | CHEWABLE_TABLET | Freq: Once | ORAL | Status: DC

## 2021-08-02 MED ORDER — HEPARIN SODIUM (PORCINE) 5000 UNIT/ML IJ SOLN: 5000.0000 [IU] | Freq: Three times a day (TID) | INTRAMUSCULAR | Status: DC

## 2021-08-02 MED ORDER — ATORVASTATIN CALCIUM 80 MG PO TABS
80.0000 mg | ORAL_TABLET | Freq: Every evening | ORAL | Status: DC
  Administered 2021-08-03 – 2021-08-06 (×4): 80 mg via ORAL
  Filled 2021-08-02 (×2): qty 1
  Filled 2021-08-02: qty 2
  Filled 2021-08-02: qty 1

## 2021-08-02 MED ORDER — SODIUM CHLORIDE 0.9 % IV SOLN: INTRAVENOUS | Status: DC

## 2021-08-02 MED ORDER — INSULIN GLARGINE-YFGN 100 UNIT/ML ~~LOC~~ SOLN
20.0000 [IU] | Freq: Every day | SUBCUTANEOUS | Status: DC
  Administered 2021-08-02: 20 [IU] via SUBCUTANEOUS
  Filled 2021-08-02 (×3): qty 0.2

## 2021-08-02 MED ORDER — ONDANSETRON HCL 4 MG PO TABS: 4.0000 mg | ORAL_TABLET | Freq: Four times a day (QID) | ORAL | Status: DC | PRN

## 2021-08-02 MED ORDER — INSULIN ASPART 100 UNIT/ML IJ SOLN
0.0000 [IU] | Freq: Three times a day (TID) | INTRAMUSCULAR | Status: DC
  Administered 2021-08-03: 3 [IU] via SUBCUTANEOUS
  Administered 2021-08-03: 12:00:00 11 [IU] via SUBCUTANEOUS
  Administered 2021-08-04: 3 [IU] via SUBCUTANEOUS
  Administered 2021-08-05 (×2): 2 [IU] via SUBCUTANEOUS
  Administered 2021-08-06: 5 [IU] via SUBCUTANEOUS
  Administered 2021-08-06: 2 [IU] via SUBCUTANEOUS
  Administered 2021-08-07: 3 [IU] via SUBCUTANEOUS

## 2021-08-02 MED ORDER — ACETAMINOPHEN 650 MG RE SUPP: 650.0000 mg | Freq: Four times a day (QID) | RECTAL | Status: DC | PRN

## 2021-08-02 MED ORDER — APIXABAN 5 MG PO TABS
5.0000 mg | ORAL_TABLET | Freq: Two times a day (BID) | ORAL | Status: DC
  Administered 2021-08-02: 5 mg via ORAL
  Filled 2021-08-02: qty 1

## 2021-08-02 MED ORDER — SODIUM CHLORIDE 0.9 % IV SOLN: INTRAVENOUS | Status: AC

## 2021-08-02 MED ORDER — POLYETHYLENE GLYCOL 3350 17 G PO PACK: 17.0000 g | PACK | Freq: Every day | ORAL | Status: DC | PRN

## 2021-08-02 MED ORDER — SODIUM CHLORIDE 0.9 % IV BOLUS
1000.0000 mL | Freq: Once | INTRAVENOUS | Status: AC
Start: 2021-08-02 — End: 2021-08-02
  Administered 2021-08-02: 1000 mL via INTRAVENOUS

## 2021-08-02 MED ORDER — ONDANSETRON HCL 4 MG/2ML IJ SOLN
4.0000 mg | Freq: Four times a day (QID) | INTRAMUSCULAR | Status: DC | PRN
  Administered 2021-08-05: 4 mg via INTRAVENOUS
  Filled 2021-08-02: qty 2

## 2021-08-02 MED ORDER — INSULIN ASPART 100 UNIT/ML IJ SOLN
0.0000 [IU] | Freq: Every day | INTRAMUSCULAR | Status: DC
  Administered 2021-08-02: 3 [IU] via SUBCUTANEOUS
  Administered 2021-08-04: 2 [IU] via SUBCUTANEOUS

## 2021-08-02 NOTE — Progress Notes (Incomplete)
ANTICOAGULATION CONSULT NOTE - Initial Consult  Pharmacy Consult for heparin Indication: chest pain/ACS  Allergies  Allergen Reactions   Penicillins Hives    Has patient had a PCN reaction causing immediate rash, facial/tongue/throat swelling, SOB or lightheadedness with hypotension: no Has patient had a PCN reaction causing severe rash involving mucus membranes or skin necrosis: No  Has patient had a PCN reaction that required hospitalization: no Has patient had a PCN reaction occurring within the last 10 years: no If all of the above answers are "NO", then may proceed with Cephalosporin use.     Patient Measurements: Height: 5\' 6"  (167.6 cm) Weight: 74 kg (163 lb 1.6 oz) IBW/kg (Calculated) : 59.3  Vital Signs: Temp: 98.2 F (36.8 C) (02/07 2150) Temp Source: Oral (02/07 1900) BP: 133/54 (02/07 2150) Pulse Rate: 73 (02/07 2150)  Labs: Recent Labs    08/02/21 1320 08/02/21 1522 08/02/21 2131  HGB 11.5*  --   --   HCT 33.1*  --   --   PLT 164  --   --   CREATININE 1.71*  --   --   TROPONINIHS 36* 127* 1,022*    Estimated Creatinine Clearance: 28.8 mL/min (A) (by C-G formula based on SCr of 1.71 mg/dL (H)).   Medical History: Past Medical History:  Diagnosis Date   (HFpEF) heart failure with preserved ejection fraction ([place])    Limited Echo 7/22: Mild asymmetrical LVH due to scar and thinning of basal posterior wall and background conc LVH, apical and mid segments hyperdynamic, EF 60-65, basal inf-lat and basal inf AK, normal RVSF, RVSP 26.6, mild LAE, mild-moderate MR, mild AV sclerosis w/o AS // Echo 5/22: EF 55-60, RVSP 27, mild to mod LAE, poss small to mod eff; AV sclerosis no AS   Arthritis    "all over"   CAD (coronary artery disease)    a. NSTEMI 12/2013 - occluded dominant RCA with L-R collaterals, moderate prox segmental LAD disease, for medical therapy initially, EF 60% with subtle inferobasal hypokinesia. Consider PCI for refractory CP.   CKD (chronic  kidney disease), stage III ([place])    Hypertension    Migraines    "weekly" (01/06/2014)   Sickle cell trait (HCC)    TIA (transient ischemic attack) 1978   Type II diabetes mellitus (HCC)    Vertigo     Medications:  Medications Prior to Admission  Medication Sig Dispense Refill Last Dose   atorvastatin (LIPITOR) 80 MG tablet Take 1 tablet (80 mg total) by mouth every evening. 90 tablet 3 08/02/2021   colesevelam (WELCHOL) 625 MG tablet Take 1,875 mg by mouth 2 (two) times daily with a meal. Noon and night   08/02/2021   ELIQUIS 5 MG TABS tablet Take 1 tablet (5 mg total) by mouth 2 (two) times daily. 180 tablet 1 08/02/2021 at 0730   ENTRESTO 24-26 MG Take 1 tablet by mouth 2 (two) times daily.   08/02/2021   furosemide (LASIX) 40 MG tablet Take 40 mg  one tablet by mouth daily , may take an additional 40 mg if needed daily if leg edema worsens 180 tablet 3 08/02/2021   hydrALAZINE (APRESOLINE) 50 MG tablet Take 1 tablet (50 mg total) by mouth 3 (three) times daily. (Patient taking differently: Take 25 mg by mouth 3 (three) times daily.) 270 tablet 1 08/02/2021   Insulin Glargine (BASAGLAR KWIKPEN) 100 UNIT/ML INJECT 32 UNITS INTO THE SKIN AT BEDTIME. 30 mL 0 08/01/2021   JARDIANCE 10 MG TABS tablet  Take 10 mg by mouth daily.   08/02/2021   lisinopril (ZESTRIL) 20 MG tablet Take 1 tablet (20 mg total) by mouth daily. 90 tablet 3 08/02/2021   nitroGLYCERIN (NITROSTAT) 0.4 MG SL tablet PLACE 1 TABLET UNDER THE TONGUE EVERY 5 MINUTES AS NEEDED FOR CHEST PAIN (UP TO 3 DOSES) (Patient taking differently: Place 0.4 mg under the tongue every 5 (five) minutes as needed.) 75 tablet 1 08/02/2021   NOVOLOG 100 UNIT/ML injection Inject 6-9 Units into the skin 3 (three) times daily with meals. Sliding Scale   08/02/2021   polyethylene glycol (MIRALAX / GLYCOLAX) 17 g packet Take 17 g by mouth daily. 14 each 0 08/01/2021   Vitamin D, Cholecalciferol, 25 MCG (1000 UT) TABS Take 2,000 Units by mouth daily.   08/02/2021   BD INSULIN  SYRINGE U/F 31G X 5/16" 0.5 ML MISC Use to take with Novolog TIDAC 100 each 5    Continuous Blood Gluc Receiver (FREESTYLE LIBRE 2 READER) DEVI 1 each by Does not apply route daily in the afternoon. 1 each 0    Continuous Blood Gluc Sensor (FREESTYLE LIBRE 2 SENSOR) MISC 1 each by Does not apply route daily in the afternoon. 1 each 0    dronedarone (MULTAQ) 400 MG tablet Take 1 tablet (400 mg total) by mouth 2 (two) times daily with a meal. (Patient not taking: Reported on 08/02/2021) 60 tablet 0 Not Taking   pantoprazole (PROTONIX) 40 MG tablet Take 1 tablet (40 mg total) by mouth 2 (two) times daily. (Patient not taking: Reported on 08/02/2021) 168 tablet 0 Not Taking   senna-docusate (SENOKOT-S) 8.6-50 MG tablet Take 1 tablet by mouth at bedtime as needed for mild constipation. (Patient not taking: Reported on 08/02/2021) 30 tablet 0 Not Taking   Scheduled:   apixaban  5 mg Oral BID   aspirin  324 mg Oral Once   [START ON 08/03/2021] atorvastatin  80 mg Oral QPM   [START ON 08/03/2021] insulin aspart  0-15 Units Subcutaneous TID WC   insulin aspart  0-5 Units Subcutaneous QHS   insulin glargine-yfgn  20 Units Subcutaneous QHS   Infusions:   sodium chloride 75 mL/hr at 08/02/21 2106    Assessment: 76yo female c/o sudden onset of left-sided CP associated with N/V, initial troponin mildly elevated but now rising (36 > 127 > 1022), to begin heparin; pt is on Eliquis PTA for  Goal of Therapy:  {IEPPI:9518841} {Monitor platelets by anticoagulation protocol:3041561::"Monitor platelets by anticoagulation protocol: Yes"}   Plan:  {YSAY:3016010}  Rogue Bussing 08/02/2021,11:39 PM

## 2021-08-02 NOTE — ED Notes (Signed)
Assisted to bedside commode

## 2021-08-02 NOTE — H&P (Signed)
History and Physical    KENORA SPAYD ZOX:[id number] DOB: [birth year] DOA: 08/02/2021  PCP: Sharilyn Sites, MD   Patient coming from: Home  I have personally briefly reviewed patient's old medical records in [place]  Chief Complaint: Chest pain  HPI: Cassandra Adams is a 76 y.o. female with medical history significant for diastolic CHF, atrial fibrillation, coronary artery disease, CKD 3, diabetes mellitus hypertension. Patient presented to the ED with complaints of left sided chest pain that started about 12 this morning.  Patient reports she was walking from her bathroom to her bedroom when chest pain started.  She felt she could not catch her breath, but that has since resolved.  No nausea no vomiting.  She reports sensation of feeling hot and cold, she denies dizziness or feeling like she will pass out. Patient reports she checked her blood pressure this morning when she got up as she normally does and needs was 94/56, she went ahead and took her blood pressure medications.  She reports yesterday morning she took her blood pressure and the top number was 109. She checks her weight daily and it is unchanged.  She is compliant with her medications which include Lasix 40 mg daily, Entresto new medication started about 2 to 3 weeks ago, hydralazine.  Recent hospitalization 12/17 through 12/21 for hypoglycemia and bradycardia  EMS gave patient 1 nitro with complaints of chest pain, 200 mill bolus en route.  ED Course: Blood pressure systolic 81/19 on arrival to the ED, with improvement in blood pressure systolic now 147W . 1 L bolus given.  Troponin 36 > 127.  Creatinine elevated 1.71.  Random glucose 362.  Chest x-ray clear.  EKG showing sinus rhythm with T wave inversions in lead to 3 aVF V4 through V6. Hospitalist to admit for chest pain and hypotension with AKI.  Review of Systems: As per HPI all other systems reviewed and negative.  Past Medical History:  Diagnosis  Date   (HFpEF) heart failure with preserved ejection fraction ([place])    Limited Echo 7/22: Mild asymmetrical LVH due to scar and thinning of basal posterior wall and background conc LVH, apical and mid segments hyperdynamic, EF 60-65, basal inf-lat and basal inf AK, normal RVSF, RVSP 26.6, mild LAE, mild-moderate MR, mild AV sclerosis w/o AS // Echo 5/22: EF 55-60, RVSP 27, mild to mod LAE, poss small to mod eff; AV sclerosis no AS   Arthritis    "all over"   CAD (coronary artery disease)    a. NSTEMI 12/2013 - occluded dominant RCA with L-R collaterals, moderate prox segmental LAD disease, for medical therapy initially, EF 60% with subtle inferobasal hypokinesia. Consider PCI for refractory CP.   CKD (chronic kidney disease), stage III ([place])    Hypertension    Migraines    "weekly" (01/06/2014)   Sickle cell trait ([place])    TIA (transient ischemic attack) 1978   Type II diabetes mellitus ([place])    Vertigo     Past Surgical History:  Procedure Laterality Date   APPENDECTOMY  March 2014   had appendix frozen   CARDIAC CATHETERIZATION  "years ago" & 01/05/2014   CARDIOVERSION N/A 03/17/2014   Procedure: CARDIOVERSION;  Surgeon: [name], MD;  Location: [place];  Service: Cardiovascular;  Laterality: N/A;   CARDIOVERSION N/A 01/14/2021   Procedure: CARDIOVERSION;  Surgeon: [name], MD;  Location: [facility] ENDOSCOPY;  Service: Cardiovascular;  Laterality: N/A;   CATARACT EXTRACTION W/ INTRAOCULAR LENS  IMPLANT,  BILATERAL Bilateral 04/2013-05/2013   ENDARTERECTOMY Right 02/05/2017   Procedure: ENDARTERECTOMY CAROTID-RIGHT;  Surgeon: [name], MD;  Location: [facility] OR;  Service: Vascular;  Laterality: Right;   ESOPHAGOGASTRODUODENOSCOPY  11/08/2007    Normal esophagus without evidence of Barrett, mass, erosion/ Normal stomach, duodenal bulb   ESOPHAGOGASTRODUODENOSCOPY (EGD) WITH PROPOFOL N/A 11/08/2020   Procedure: ESOPHAGOGASTRODUODENOSCOPY (EGD) WITH PROPOFOL;  Surgeon:  Thornton Park, MD;  Location: [place];  Service: Gastroenterology;  Laterality: N/A;   GASTRIC MOTILITY STUDY  11/13/2007   mildly delayed emptying subjectively, but normal  analysis-77% of tracer emptied at 2 hours   JOINT REPLACEMENT     LAPAROSCOPIC APPENDECTOMY N/A 09/15/2012   Procedure: APPENDECTOMY LAPAROSCOPIC;  Surgeon: [name], MD;  Location: [facility];  Service: General;  Laterality: N/A;   LEFT HEART CATHETERIZATION WITH CORONARY ANGIOGRAM N/A 01/05/2014   Procedure: LEFT HEART CATHETERIZATION WITH CORONARY ANGIOGRAM;  Surgeon: [name], MD;  Location: [facility] CATH LAB;  Service: Cardiovascular;  Laterality: N/A;   REPLACEMENT TOTAL KNEE Right 05-03-06   TEE WITHOUT CARDIOVERSION N/A 03/17/2014   Procedure: TRANSESOPHAGEAL ECHOCARDIOGRAM (TEE);  Surgeon: [name], MD;  Location: [facility] ENDOSCOPY;  Service: Cardiovascular;  Laterality: N/A;   TONSILLECTOMY  1972     reports that she has never smoked. She has never used smokeless tobacco. She reports that she does not drink alcohol and does not use drugs.  Allergies  Allergen Reactions   Penicillins Hives    Has patient had a PCN reaction causing immediate rash, facial/tongue/throat swelling, SOB or lightheadedness with hypotension: no Has patient had a PCN reaction causing severe rash involving mucus membranes or skin necrosis: No  Has patient had a PCN reaction that required hospitalization: no Has patient had a PCN reaction occurring within the last 10 years: no If all of the above answers are "NO", then may proceed with Cephalosporin use.     Family History  Problem Relation Age of Onset   Hyperlipidemia Mother    Hypertension Mother    Heart attack Mother    Cancer Father        lung   Hypertension Sister    Diabetes Brother    Heart disease Brother    Hyperlipidemia Brother    Hypertension Brother    Heart attack Brother    Other Brother        DVT   Prior to Admission medications    Medication Sig Start Date End Date Taking? Authorizing Provider  atorvastatin (LIPITOR) 80 MG tablet Take 1 tablet (80 mg total) by mouth every evening. 12/21/20   Hilty, Nadean Corwin, MD  BD INSULIN SYRINGE U/F 31G X 5/16" 0.5 ML MISC Use to take with Novolog [facility] 01/07/21   Cassandria Anger, MD  colesevelam (WELCHOL) 625 MG tablet Take 1,875 mg by mouth 2 (two) times daily with a meal. Noon and night    [provider]  Continuous Blood Gluc Receiver (FREESTYLE LIBRE 2 READER) DEVI 1 each by Does not apply route daily in the afternoon. 06/15/21   Amin, Jeanella Flattery, MD  Continuous Blood Gluc Sensor (FREESTYLE LIBRE 2 SENSOR) MISC 1 each by Does not apply route daily in the afternoon. 06/15/21   Damita Lack, MD  dronedarone (MULTAQ) 400 MG tablet Take 1 tablet (400 mg total) by mouth 2 (two) times daily with a meal. 06/15/21   Amin, Ankit Chirag, MD  ELIQUIS 5 MG TABS tablet Take 1 tablet (5 mg total) by mouth 2 (  two) times daily. 11/07/20   Johnson, Clanford L, MD  ENTRESTO 24-26 MG Take 1 tablet by mouth 2 (two) times daily. 07/16/21   [provider]  furosemide (LASIX) 40 MG tablet Take 40 mg  one tablet by mouth daily , may take an additional 40 mg if needed daily if leg edema worsens 01/10/21   Almyra Deforest, PA  glipiZIDE (GLUCOTROL XL) 2.5 MG 24 hr tablet Take 2.5 mg by mouth daily. 07/23/21   [provider]  hydrALAZINE (APRESOLINE) 50 MG tablet Take 1 tablet (50 mg total) by mouth 3 (three) times daily. Patient taking differently: Take 25 mg by mouth 3 (three) times daily. 03/15/21   Almyra Deforest, PA  Insulin Glargine [facility]) 100 UNIT/ML INJECT 32 UNITS INTO THE SKIN AT BEDTIME. 04/19/21   Reardon, Juanetta Beets, NP  JARDIANCE 10 MG TABS tablet Take 10 mg by mouth daily. 07/02/21   [provider]  lisinopril (ZESTRIL) 20 MG tablet Take 1 tablet (20 mg total) by mouth daily. 12/21/20   Hilty, Nadean Corwin, MD  nitroGLYCERIN (NITROSTAT) 0.4 MG SL tablet  PLACE 1 TABLET UNDER THE TONGUE EVERY 5 MINUTES AS NEEDED FOR CHEST PAIN (UP TO 3 DOSES) Patient taking differently: Place 0.4 mg under the tongue every 5 (five) minutes as needed. 03/25/21   Hilty, Nadean Corwin, MD  NOVOLOG 100 UNIT/ML injection Inject 6-9 Units into the skin 3 (three) times daily with meals. Sliding Scale 07/28/20   [provider]  pantoprazole (PROTONIX) 40 MG tablet Take 1 tablet (40 mg total) by mouth 2 (two) times daily. 11/11/20 06/11/21  Dessa Phi, DO  polyethylene glycol (MIRALAX / GLYCOLAX) 17 g packet Take 17 g by mouth daily. 11/12/20   Dessa Phi, DO  senna-docusate (SENOKOT-S) 8.6-50 MG tablet Take 1 tablet by mouth at bedtime as needed for mild constipation. 11/11/20   Dessa Phi, DO  Vitamin D, Cholecalciferol, 25 MCG (1000 UT) TABS Take 2,000 Units by mouth every other day.    [provider]    Physical Exam: Vitals:   08/02/21 1400 08/02/21 1436 08/02/21 1500 08/02/21 1624  BP: (!) 136/51 (!) 138/94 139/64 138/84  Pulse: 76 79 64 64  Resp: 17 17 15 18   Temp:      TempSrc:      SpO2: 99% 100% 99% 100%  Weight:      Height:        Constitutional: NAD, calm, comfortable Vitals:   08/02/21 1400 08/02/21 1436 08/02/21 1500 08/02/21 1624  BP: (!) 136/51 (!) 138/94 139/64 138/84  Pulse: 76 79 64 64  Resp: 17 17 15 18   Temp:      TempSrc:      SpO2: 99% 100% 99% 100%  Weight:      Height:       Eyes: PERRL, lids and conjunctivae normal ENMT: Mucous membranes are moist.  Neck: normal, supple, no masses, no thyromegaly Respiratory: clear to auscultation bilaterally, no wheezing, no crackles. Normal respiratory effort. No accessory muscle use.  Cardiovascular: Regular rate and rhythm, no murmurs / rubs / gallops. No extremity edema. 2+ pedal pulses.   Abdomen: no tenderness, no masses palpated. No hepatosplenomegaly. Bowel sounds positive.  Musculoskeletal: no clubbing / cyanosis. No joint deformity upper and lower extremities.  Good ROM, no contractures. Normal muscle tone.  Skin: no rashes, lesions, ulcers. No induration Neurologic: Upper endocrine in about a month, mental extremities spontaneously.[name]  Psychiatric: Normal judgment and insight. Alert and oriented x 3.  Normal mood.   Labs on Admission: I have personally reviewed following labs and imaging studies  CBC: Recent Labs  Lab 08/02/21 1320  WBC 3.5*  HGB 11.5*  HCT 33.1*  MCV 80.9  PLT 619   Basic Metabolic Panel: Recent Labs  Lab 08/02/21 1320  NA 133*  K 4.0  CL 101  CO2 20*  GLUCOSE 362*  BUN 41*  CREATININE 1.71*  CALCIUM 9.2   GFR: Estimated Creatinine Clearance: 29.8 mL/min (A) (by C-G formula based on SCr of 1.71 mg/dL (H)). Liver Function Tests: Recent Labs  Lab 08/02/21 1320  AST 24  ALT 18  ALKPHOS 52  BILITOT 0.5  PROT 6.0*  ALBUMIN 3.3*    Recent Labs  Lab 08/02/21 1324  GLUCAP 357*   Urine analysis:    Component Value Date/Time   COLORURINE COLORLESS (A) 08/02/2021 1325   APPEARANCEUR CLEAR 08/02/2021 1325   LABSPEC 1.008 08/02/2021 1325   PHURINE 5.0 08/02/2021 1325   GLUCOSEU >=500 (A) 08/02/2021 1325   HGBUR NEGATIVE 08/02/2021 1325   BILIRUBINUR NEGATIVE 08/02/2021 1325   KETONESUR NEGATIVE 08/02/2021 1325   PROTEINUR NEGATIVE 08/02/2021 1325   UROBILINOGEN 0.2 09/15/2012 1010   NITRITE NEGATIVE 08/02/2021 1325   LEUKOCYTESUR NEGATIVE 08/02/2021 1325    Radiological Exams on Admission: DG Chest 2 View  Result Date: 08/02/2021 CLINICAL DATA:  chest pain EXAM: CHEST - 2 VIEW COMPARISON:  06/11/2021. FINDINGS: Slight mild elevation the right hemidiaphragm, unchanged. No consolidation. No visible pleural effusions or pneumothorax. Similar cardiomediastinal silhouette. IMPRESSION: No evidence of acute cardiopulmonary disease. Electronically Signed   By: Margaretha Sheffield M.D.   On: 08/02/2021 13:47    EKG: Independently reviewed.  Nonspecific T wave changes to leads II, III, aVF, V4 through V6.  Sinus  rhythm.  Rate 94.  QTc 457.  Assessment/Plan Principal Problem:   Chest pain Active Problems:   Essential hypertension, benign   PAF (paroxysmal atrial fibrillation) (HCC)   CKD stage 3 due to type 2 diabetes mellitus (HCC)   CAD (coronary artery disease)   Atrial fibrillation, chronic (HCC)   Chest pain with coronary artery disease-EKG with nonspecific changes, troponin 36 > 127, setting of hypotension. ?  Demand ischemia.  Chest pain has since resolved with improvement in blood pressure.  -  Cardiac cath 2015 showed RCA occlusion, with collaterals.  Also 50- 70% stenosis in other arteries.  Given her lack of symptoms at that time and preserved EF, was to treat medically.  With plans that if patient develop recurrent chest pain, she will be a candidate for PCI and stenting of her RCA . She follows with cardiologist Dr. Debara Pickett.  06/30/21- aspirin was discontinued by her cardiologist as she is on eliquis with stage III kidney disease, and as she had no evidence of recurrent chest pain. -1 L bolus given, cont N/s 75cc/hr x 15hrs -  Trend troponin -EKG in the morning - Recent echo 05/2021-EF 55% severe LV hypertrophy.  Normal LV diastolic parameters.  Hypotension, acute kidney injury-blood pressure down to 84/48 on arrival to the ED.  Blood pressure earlier at home was 94/56.  Good oral intake, and no GI losses.  She is on Lasix and antihypertensives- hydralazine, and Entresto. -May need to adjust blood pressure medications on discharge -Hold Lasix hydralazine and Entresto -Hydrate  Paroxysmal atrial fibrillation-rate controlled, on anticoagulation.  Carvedilol was discontinued during recent hospitalization for bradycardia.  She was started on Multaq.  Patient tells me today that this was also  discontinued.  -Resume Eliquis  Dabetes mellitus-random glucose 262.  Hemoglobin A1c 8.1. -Home Lantus at reduced dose 20u QHS ( Home dose 32u) - SSI- M -Hold Jardiance  AKI on CKD 3 B-creatinine 1.7,  then 1.2-1.4. -Hydrate   DVT prophylaxis: Eliquis Code Status: Full Family Communication: Daughter Alfreda at bedside Disposition Plan: ~ 1 - 2 days Consults called: None Admission status: Obs tele    Bethena Roys MD [facility]  08/02/2021, 5:31 PM

## 2021-08-02 NOTE — ED Provider Notes (Addendum)
[facility] EMERGENCY DEPARTMENT Provider Note   CSN: [id number] Arrival date & time: 08/02/21  1252     History  Chief Complaint  Patient presents with   Chest Pain    Cassandra Adams is a 76 y.o. female.  Patient brought in by EMS.  For chest pain.  Patient given nitroglycerin prior to arrival.  Patient arrived here with anterior chest pain still ongoing.  More to the left side.  Did have some nausea vomiting.  Patient arrived here hypotensive with a systolic pressure of 84.  Told is that her blood pressures this morning were 92 but she took her morning blood pressure anyways.  Patient is not on any blood thinners.  Patient is not on aspirin.  Past medical history significant for hypertension type 2 diabetes chronic kidney disease history of coronary disease had a non-STEMI in 2015 but no stents.      Home Medications Prior to Admission medications   Medication Sig Start Date End Date Taking? Authorizing Provider  atorvastatin (LIPITOR) 80 MG tablet Take 1 tablet (80 mg total) by mouth every evening. 12/21/20   Hilty, Nadean Corwin, MD  BD INSULIN SYRINGE U/F 31G X 5/16" 0.5 ML MISC Use to take with Novolog Navos 01/07/21   Cassandria Anger, MD  colesevelam (WELCHOL) 625 MG tablet Take 1,875 mg by mouth 2 (two) times daily with a meal. Noon and night    [provider]  Continuous Blood Gluc Receiver (FREESTYLE LIBRE 2 READER) DEVI 1 each by Does not apply route daily in the afternoon. 06/15/21   Amin, Jeanella Flattery, MD  Continuous Blood Gluc Sensor (FREESTYLE LIBRE 2 SENSOR) MISC 1 each by Does not apply route daily in the afternoon. 06/15/21   Damita Lack, MD  dronedarone (MULTAQ) 400 MG tablet Take 1 tablet (400 mg total) by mouth 2 (two) times daily with a meal. 06/15/21   Amin, Ankit Chirag, MD  ELIQUIS 5 MG TABS tablet Take 1 tablet (5 mg total) by mouth 2 (two) times daily. 11/07/20   Murlean Iba, MD  furosemide (LASIX) 40 MG tablet Take 40 mg   one tablet by mouth daily , may take an additional 40 mg if needed daily if leg edema worsens 01/10/21   Almyra Deforest, PA  hydrALAZINE (APRESOLINE) 50 MG tablet Take 1 tablet (50 mg total) by mouth 3 (three) times daily. Patient taking differently: Take 25 mg by mouth 3 (three) times daily. 03/15/21   Almyra Deforest, PA  Insulin Glargine [facility]) 100 UNIT/ML INJECT 32 UNITS INTO THE SKIN AT BEDTIME. 04/19/21   Brita Romp, NP  lisinopril (ZESTRIL) 20 MG tablet Take 1 tablet (20 mg total) by mouth daily. 12/21/20   Hilty, Nadean Corwin, MD  nitroGLYCERIN (NITROSTAT) 0.4 MG SL tablet PLACE 1 TABLET UNDER THE TONGUE EVERY 5 MINUTES AS NEEDED FOR CHEST PAIN (UP TO 3 DOSES) Patient taking differently: Place 0.4 mg under the tongue every 5 (five) minutes as needed. 03/25/21   Hilty, Nadean Corwin, MD  NOVOLOG 100 UNIT/ML injection Inject 6-9 Units into the skin 3 (three) times daily with meals. Sliding Scale 07/28/20   [provider]  pantoprazole (PROTONIX) 40 MG tablet Take 1 tablet (40 mg total) by mouth 2 (two) times daily. 11/11/20 06/11/21  Dessa Phi, DO  polyethylene glycol (MIRALAX / GLYCOLAX) 17 g packet Take 17 g by mouth daily. 11/12/20   Dessa Phi, DO  senna-docusate (SENOKOT-S) 8.6-50 MG tablet Take 1 tablet by  mouth at bedtime as needed for mild constipation. 11/11/20   Dessa Phi, DO  Vitamin D, Cholecalciferol, 25 MCG (1000 UT) TABS Take 2,000 Units by mouth every other day.    [provider]      Allergies    Penicillins    Review of Systems   Review of Systems  Constitutional:  Negative for chills and fever.  HENT:  Negative for ear pain and sore throat.   Eyes:  Negative for pain and visual disturbance.  Respiratory:  Negative for cough and shortness of breath.   Cardiovascular:  Positive for chest pain. Negative for palpitations.  Gastrointestinal:  Negative for abdominal pain and vomiting.  Genitourinary:  Negative for dysuria and hematuria.   Musculoskeletal:  Negative for arthralgias and back pain.  Skin:  Negative for color change and rash.  Neurological:  Negative for seizures and syncope.  All other systems reviewed and are negative.  Physical Exam Updated Vital Signs BP 138/84    Pulse 64    Temp 97.9 F (36.6 C) (Oral)    Resp 18    Ht 1.676 m (5\' 6" )    Wt 79.8 kg    SpO2 100%    BMI 28.40 kg/m  Physical Exam Vitals and nursing note reviewed.  Constitutional:      General: She is not in acute distress.    Appearance: Normal appearance. She is well-developed.  HENT:     Head: Normocephalic and atraumatic.  Eyes:     Extraocular Movements: Extraocular movements intact.     Conjunctiva/sclera: Conjunctivae normal.     Pupils: Pupils are equal, round, and reactive to light.  Cardiovascular:     Rate and Rhythm: Normal rate and regular rhythm.     Heart sounds: No murmur heard. Pulmonary:     Effort: Pulmonary effort is normal. No respiratory distress.     Breath sounds: Normal breath sounds.  Abdominal:     Palpations: Abdomen is soft.     Tenderness: There is no abdominal tenderness.  Musculoskeletal:        General: No swelling.     Cervical back: Normal range of motion and neck supple.  Skin:    General: Skin is warm and dry.     Capillary Refill: Capillary refill takes less than 2 seconds.  Neurological:     General: No focal deficit present.     Mental Status: She is alert and oriented to person, place, and time.     Cranial Nerves: No cranial nerve deficit.     Sensory: No sensory deficit.     Motor: No weakness.  Psychiatric:        Mood and Affect: Mood normal.    ED Results / Procedures / Treatments   Labs (all labs ordered are listed, but only abnormal results are displayed) Labs Reviewed  CBC - Abnormal; Notable for the following components:      Result Value   WBC 3.5 (*)    Hemoglobin 11.5 (*)    HCT 33.1 (*)    All other components within normal limits  COMPREHENSIVE METABOLIC  PANEL - Abnormal; Notable for the following components:   Sodium 133 (*)    CO2 20 (*)    Glucose, Bld 362 (*)    BUN 41 (*)    Creatinine, Ser 1.71 (*)    Total Protein 6.0 (*)    Albumin 3.3 (*)    GFR, Estimated 31 (*)    All other components within  normal limits  URINALYSIS, ROUTINE W REFLEX MICROSCOPIC - Abnormal; Notable for the following components:   Color, Urine COLORLESS (*)    Glucose, UA >=500 (*)    All other components within normal limits  CBG MONITORING, ED - Abnormal; Notable for the following components:   Glucose-Capillary 357 (*)    All other components within normal limits  TROPONIN I (HIGH SENSITIVITY) - Abnormal; Notable for the following components:   Troponin I (High Sensitivity) 36 (*)    All other components within normal limits  TROPONIN I (HIGH SENSITIVITY) - Abnormal; Notable for the following components:   Troponin I (High Sensitivity) 127 (*)    All other components within normal limits  BRAIN NATRIURETIC PEPTIDE  CBC WITH DIFFERENTIAL/PLATELET    EKG EKG Interpretation  Date/Time:  Tuesday August 02 2021 13:03:31 EST Ventricular Rate:  94 PR Interval:  169 QRS Duration: 111 QT Interval:  365 QTC Calculation: 457 R Axis:   28 Text Interpretation: Sinus rhythm Atrial premature complex Probable LVH with secondary repol abnrm Inferior infarct, age indeterminate New since previous tracing Confirmed by Fredia Sorrow [phone]) on 08/02/2021 1:29:03 PM  Radiology DG Chest 2 View  Result Date: 08/02/2021 CLINICAL DATA:  chest pain EXAM: CHEST - 2 VIEW COMPARISON:  06/11/2021. FINDINGS: Slight mild elevation the right hemidiaphragm, unchanged. No consolidation. No visible pleural effusions or pneumothorax. Similar cardiomediastinal silhouette. IMPRESSION: No evidence of acute cardiopulmonary disease. Electronically Signed   By: Margaretha Sheffield M.D.   On: 08/02/2021 13:47    Procedures Procedures    Medications Ordered in ED Medications  0.9 %   sodium chloride infusion ( Intravenous New Bag/Given 08/02/21 1417)  sodium chloride 0.9 % bolus 1,000 mL (0 mLs Intravenous Stopped 08/02/21 1416)    ED Course/ Medical Decision Making/ A&P                           Medical Decision Making Amount and/or Complexity of Data Reviewed Labs: ordered. Radiology: ordered.  Risk OTC drugs. Prescription drug management. Decision regarding hospitalization.  CRITICAL CARE Performed by: Fredia Sorrow Total critical care time: 35 minutes Critical care time was exclusive of separately billable procedures and treating other patients. Critical care was necessary to treat or prevent imminent or life-threatening deterioration. Critical care was time spent personally by me on the following activities: development of treatment plan with patient and/or surrogate as well as nursing, discussions with consultants, evaluation of patient's response to treatment, examination of patient, obtaining history from patient or surrogate, ordering and performing treatments and interventions, ordering and review of laboratory studies, ordering and review of radiographic studies, pulse oximetry and re-evaluation of patient's condition.  Patient is now chest pain-free.  Patient did come in hypotensive.  The patient has been given nitroglycerin by EMS.  Plus she had taken her blood pressure medicine this morning she said when her blood pressures actually were measuring around 92.  Repeat troponin was 127.  Patient is now chest pain-free no pain at 5 PM.  But will give a baby aspirin.  We will hold off on heparin at this time and less hospitalist once it.  Initial troponin was 36.  Complete metabolic panel significant for an elevated blood sugar of 362.  CO2 down some at 20.  Renal function at 31 BUN and creatinine elevated.  BC pending.  BNP normal.  Chest x-ray without any acute findings.  EKG without any acute findings.  Shortly after arrival patient  was just little bit of  fluid patient's blood pressures came up.  Now we have systolics in the 003K.  We will make sure COVID influenza testing is been ordered.   Only after patient's blood pressure coming up.  Chest pain completely resolved.  Patient has been without any chest pain now for a few hours.  Patient's blood pressure is also been up since about 130 this afternoon.  Patient given a baby aspirin here.  Discussed with hospitalist who will admit.  Patient's blood pressure responded well to IV fluids.   Final Clinical Impression(s) / ED Diagnoses Final diagnoses:  Precordial pain  Hypotension, unspecified hypotension type    Rx / DC Orders ED Discharge Orders     None         Fredia Sorrow, MD 08/02/21 1659    Fredia Sorrow, MD 08/02/21 1731

## 2021-08-02 NOTE — ED Notes (Signed)
Attempt to call report, receiving nurse not available, message left to call back

## 2021-08-02 NOTE — ED Notes (Signed)
EDP made aware of Hypotension

## 2021-08-02 NOTE — ED Triage Notes (Signed)
Pt to the ED [facility] from home with sudden onset left side chest pain with N/V.  The pt took 1 nitro prior to ems arrival and I from EMS.  The pt received 200 bolus in route for hypotension.

## 2021-08-02 NOTE — ED Notes (Signed)
Patient transported to X-ray 

## 2021-08-02 NOTE — Progress Notes (Signed)
EKG done and given to nurse 

## 2021-08-02 NOTE — ED Notes (Signed)
ED Provider at bedside. 

## 2021-08-03 DIAGNOSIS — I482 Chronic atrial fibrillation, unspecified: Secondary | ICD-10-CM | POA: Diagnosis not present

## 2021-08-03 DIAGNOSIS — I214 Non-ST elevation (NSTEMI) myocardial infarction: Secondary | ICD-10-CM | POA: Diagnosis not present

## 2021-08-03 DIAGNOSIS — E785 Hyperlipidemia, unspecified: Secondary | ICD-10-CM | POA: Diagnosis present

## 2021-08-03 DIAGNOSIS — M159 Polyosteoarthritis, unspecified: Secondary | ICD-10-CM | POA: Diagnosis present

## 2021-08-03 DIAGNOSIS — E0842 Diabetes mellitus due to underlying condition with diabetic polyneuropathy: Secondary | ICD-10-CM

## 2021-08-03 DIAGNOSIS — I5032 Chronic diastolic (congestive) heart failure: Secondary | ICD-10-CM | POA: Diagnosis not present

## 2021-08-03 DIAGNOSIS — I2510 Atherosclerotic heart disease of native coronary artery without angina pectoris: Secondary | ICD-10-CM | POA: Diagnosis not present

## 2021-08-03 DIAGNOSIS — R079 Chest pain, unspecified: Secondary | ICD-10-CM | POA: Diagnosis not present

## 2021-08-03 DIAGNOSIS — I259 Chronic ischemic heart disease, unspecified: Secondary | ICD-10-CM | POA: Diagnosis not present

## 2021-08-03 DIAGNOSIS — I2582 Chronic total occlusion of coronary artery: Secondary | ICD-10-CM | POA: Diagnosis not present

## 2021-08-03 DIAGNOSIS — D509 Iron deficiency anemia, unspecified: Secondary | ICD-10-CM | POA: Diagnosis present

## 2021-08-03 DIAGNOSIS — I480 Paroxysmal atrial fibrillation: Secondary | ICD-10-CM | POA: Diagnosis not present

## 2021-08-03 DIAGNOSIS — I959 Hypotension, unspecified: Secondary | ICD-10-CM | POA: Diagnosis present

## 2021-08-03 DIAGNOSIS — E11649 Type 2 diabetes mellitus with hypoglycemia without coma: Secondary | ICD-10-CM | POA: Diagnosis not present

## 2021-08-03 DIAGNOSIS — R519 Headache, unspecified: Secondary | ICD-10-CM | POA: Diagnosis not present

## 2021-08-03 DIAGNOSIS — I130 Hypertensive heart and chronic kidney disease with heart failure and stage 1 through stage 4 chronic kidney disease, or unspecified chronic kidney disease: Secondary | ICD-10-CM | POA: Diagnosis not present

## 2021-08-03 DIAGNOSIS — D573 Sickle-cell trait: Secondary | ICD-10-CM | POA: Diagnosis present

## 2021-08-03 DIAGNOSIS — Z20822 Contact with and (suspected) exposure to covid-19: Secondary | ICD-10-CM | POA: Diagnosis not present

## 2021-08-03 DIAGNOSIS — I10 Essential (primary) hypertension: Secondary | ICD-10-CM | POA: Diagnosis not present

## 2021-08-03 DIAGNOSIS — N179 Acute kidney failure, unspecified: Secondary | ICD-10-CM | POA: Diagnosis not present

## 2021-08-03 DIAGNOSIS — I495 Sick sinus syndrome: Secondary | ICD-10-CM | POA: Diagnosis not present

## 2021-08-03 DIAGNOSIS — I6521 Occlusion and stenosis of right carotid artery: Secondary | ICD-10-CM | POA: Diagnosis present

## 2021-08-03 DIAGNOSIS — I2583 Coronary atherosclerosis due to lipid rich plaque: Secondary | ICD-10-CM | POA: Diagnosis not present

## 2021-08-03 DIAGNOSIS — Z7901 Long term (current) use of anticoagulants: Secondary | ICD-10-CM | POA: Diagnosis not present

## 2021-08-03 DIAGNOSIS — N1831 Chronic kidney disease, stage 3a: Secondary | ICD-10-CM | POA: Diagnosis not present

## 2021-08-03 DIAGNOSIS — I700 Atherosclerosis of aorta: Secondary | ICD-10-CM | POA: Diagnosis present

## 2021-08-03 DIAGNOSIS — I25110 Atherosclerotic heart disease of native coronary artery with unstable angina pectoris: Secondary | ICD-10-CM | POA: Diagnosis not present

## 2021-08-03 DIAGNOSIS — E1140 Type 2 diabetes mellitus with diabetic neuropathy, unspecified: Secondary | ICD-10-CM | POA: Diagnosis present

## 2021-08-03 DIAGNOSIS — I252 Old myocardial infarction: Secondary | ICD-10-CM | POA: Diagnosis not present

## 2021-08-03 DIAGNOSIS — N1832 Chronic kidney disease, stage 3b: Secondary | ICD-10-CM | POA: Diagnosis not present

## 2021-08-03 DIAGNOSIS — Z794 Long term (current) use of insulin: Secondary | ICD-10-CM | POA: Diagnosis not present

## 2021-08-03 DIAGNOSIS — E1122 Type 2 diabetes mellitus with diabetic chronic kidney disease: Secondary | ICD-10-CM | POA: Diagnosis not present

## 2021-08-03 DIAGNOSIS — R072 Precordial pain: Secondary | ICD-10-CM | POA: Diagnosis not present

## 2021-08-03 LAB — CBC
HCT: 31.2 % — ABNORMAL LOW (ref 36.0–46.0)
Hemoglobin: 11 g/dL — ABNORMAL LOW (ref 12.0–15.0)
MCH: 28.3 pg (ref 26.0–34.0)
MCHC: 35.3 g/dL (ref 30.0–36.0)
MCV: 80.2 fL (ref 80.0–100.0)
Platelets: 167 10*3/uL (ref 150–400)
RBC: 3.89 MIL/uL (ref 3.87–5.11)
RDW: 14.3 % (ref 11.5–15.5)
WBC: 5.8 10*3/uL (ref 4.0–10.5)
nRBC: 0 % (ref 0.0–0.2)

## 2021-08-03 LAB — GLUCOSE, CAPILLARY
Glucose-Capillary: 102 mg/dL — ABNORMAL HIGH (ref 70–99)
Glucose-Capillary: 156 mg/dL — ABNORMAL HIGH (ref 70–99)
Glucose-Capillary: 157 mg/dL — ABNORMAL HIGH (ref 70–99)
Glucose-Capillary: 165 mg/dL — ABNORMAL HIGH (ref 70–99)
Glucose-Capillary: 316 mg/dL — ABNORMAL HIGH (ref 70–99)
Glucose-Capillary: 49 mg/dL — ABNORMAL LOW (ref 70–99)
Glucose-Capillary: 77 mg/dL (ref 70–99)

## 2021-08-03 LAB — BASIC METABOLIC PANEL
Anion gap: 7 (ref 5–15)
BUN: 39 mg/dL — ABNORMAL HIGH (ref 8–23)
CO2: 25 mmol/L (ref 22–32)
Calcium: 9.4 mg/dL (ref 8.9–10.3)
Chloride: 108 mmol/L (ref 98–111)
Creatinine, Ser: 1.57 mg/dL — ABNORMAL HIGH (ref 0.44–1.00)
GFR, Estimated: 34 mL/min — ABNORMAL LOW (ref >60)
Glucose, Bld: 304 mg/dL — ABNORMAL HIGH (ref 70–99)
Potassium: 4.5 mmol/L (ref 3.5–5.1)
Sodium: 140 mmol/L (ref 135–145)

## 2021-08-03 LAB — APTT: aPTT: 64 seconds — ABNORMAL HIGH (ref 24–36)

## 2021-08-03 LAB — TROPONIN I (HIGH SENSITIVITY)
Troponin I (High Sensitivity): 1014 ng/L (ref <18)
Troponin I (High Sensitivity): 946 ng/L (ref <18)

## 2021-08-03 LAB — HEPARIN LEVEL (UNFRACTIONATED): Heparin Unfractionated: >1.1 IU/mL — ABNORMAL HIGH (ref 0.30–0.70)

## 2021-08-03 MED ORDER — HEPARIN (PORCINE) 25000 UT/250ML-% IV SOLN
1000.0000 [IU]/h | INTRAVENOUS | Status: DC
  Administered 2021-08-03: 09:00:00 1050 [IU]/h via INTRAVENOUS
  Filled 2021-08-03 (×2): qty 250

## 2021-08-03 MED ORDER — HEPARIN BOLUS VIA INFUSION
3000.0000 [IU] | Freq: Once | INTRAVENOUS | Status: DC
  Filled 2021-08-03: qty 3000

## 2021-08-03 MED ORDER — INSULIN ASPART 100 UNIT/ML IJ SOLN
4.0000 [IU] | Freq: Three times a day (TID) | INTRAMUSCULAR | Status: DC
  Administered 2021-08-03: 4 [IU] via SUBCUTANEOUS

## 2021-08-03 MED ORDER — HEPARIN (PORCINE) 25000 UT/250ML-% IV SOLN
1050.0000 [IU]/h | INTRAVENOUS | Status: DC
  Filled 2021-08-03: qty 250

## 2021-08-03 MED ORDER — ASPIRIN EC 81 MG PO TBEC
81.0000 mg | DELAYED_RELEASE_TABLET | Freq: Every day | ORAL | Status: DC
  Administered 2021-08-03 – 2021-08-04 (×2): 81 mg via ORAL
  Filled 2021-08-03 (×2): qty 1

## 2021-08-03 NOTE — Consult Note (Signed)
CARDIOLOGY CONSULT NOTE       Patient ID: Cassandra Adams MRN: [id number] DOB/AGE: 1946-03-26 76 y.o.  Admit date: 08/02/2021 Referring Physician: Maren Beach Primary Physician: Sharilyn Sites, MD Primary Cardiologist: [facility] Reason for Consultation: SEMI  Principal Problem:   Chest pain Active Problems:   Essential hypertension, benign   PAF (paroxysmal atrial fibrillation) ([place])   CKD stage 3 due to type 2 diabetes mellitus ([place])   CAD (coronary artery disease)   Atrial fibrillation, chronic (HCC)   HPI:  76 y.o. admitted with chest pain and elevated troponin History of CAD with CTO RCA by cath 2015 has not been cathed since SSCP anginal sounding pain free now troponin peak 1022 now coming down ECG SB LAD no acute ST changes On eliquis last dose last night PAF with SSS beta blockers held EF has been preserved 55% by TTE 06/13/21 CRF with Cr stable 1.57 this am and anemia stable Hct 31.2 this am  ROS All other systems reviewed and negative except as noted above  Past Medical History:  Diagnosis Date   (HFpEF) heart failure with preserved ejection fraction ([place])    Limited Echo 7/22: Mild asymmetrical LVH due to scar and thinning of basal posterior wall and background conc LVH, apical and mid segments hyperdynamic, EF 60-65, basal inf-lat and basal inf AK, normal RVSF, RVSP 26.6, mild LAE, mild-moderate MR, mild AV sclerosis w/o AS // Echo 5/22: EF 55-60, RVSP 27, mild to mod LAE, poss small to mod eff; AV sclerosis no AS   Arthritis    "all over"   CAD (coronary artery disease)    a. NSTEMI 12/2013 - occluded dominant RCA with L-R collaterals, moderate prox segmental LAD disease, for medical therapy initially, EF 60% with subtle inferobasal hypokinesia. Consider PCI for refractory CP.   CKD (chronic kidney disease), stage III (HCC)    Hypertension    Migraines    "weekly" (01/06/2014)   Sickle cell trait (HCC)    TIA (transient ischemic attack) 1978   Type II diabetes mellitus  ([place])    Vertigo     Family History  Problem Relation Age of Onset   Hyperlipidemia Mother    Hypertension Mother    Heart attack Mother    Cancer Father        lung   Hypertension Sister    Diabetes Brother    Heart disease Brother    Hyperlipidemia Brother    Hypertension Brother    Heart attack Brother    Other Brother        DVT    Social History   Socioeconomic History   Marital status: Single    Spouse name: Not on file   Number of children: Not on file   Years of education: Not on file   Highest education level: Not on file  Occupational History   Not on file  Tobacco Use   Smoking status: Never   Smokeless tobacco: Never  Vaping Use   Vaping Use: Never used  Substance and Sexual Activity   Alcohol use: No   Drug use: No   Sexual activity: Not Currently    Birth control/protection: Post-menopausal  Other Topics Concern   Not on file  Social History Narrative   Not on file   Social Determinants of Health   Financial Resource Strain: Not on file  Food Insecurity: Not on file  Transportation Needs: Not on file  Physical Activity: Not on file  Stress: Not on file  Social  Connections: Not on file  Intimate Partner Violence: Not on file    Past Surgical History:  Procedure Laterality Date   APPENDECTOMY  March 2014   had appendix frozen   CARDIAC CATHETERIZATION  "years ago" & 01/05/2014   CARDIOVERSION N/A 03/17/2014   Procedure: CARDIOVERSION;  Surgeon: [name], MD;  Location: [place];  Service: Cardiovascular;  Laterality: N/A;   CARDIOVERSION N/A 01/14/2021   Procedure: CARDIOVERSION;  Surgeon: [name], MD;  Location: [place];  Service: Cardiovascular;  Laterality: N/A;   CATARACT EXTRACTION W/ INTRAOCULAR LENS  IMPLANT, BILATERAL Bilateral 04/2013-05/2013   ENDARTERECTOMY Right 02/05/2017   Procedure: ENDARTERECTOMY CAROTID-RIGHT;  Surgeon: [name], MD;  Location: [facility];  Service: Vascular;  Laterality: Right;    ESOPHAGOGASTRODUODENOSCOPY  11/08/2007    Normal esophagus without evidence of Barrett, mass, erosion/ Normal stomach, duodenal bulb   ESOPHAGOGASTRODUODENOSCOPY (EGD) WITH PROPOFOL N/A 11/08/2020   Procedure: ESOPHAGOGASTRODUODENOSCOPY (EGD) WITH PROPOFOL;  Surgeon: Thornton Park, MD;  Location: [place];  Service: Gastroenterology;  Laterality: N/A;   GASTRIC MOTILITY STUDY  11/13/2007   mildly delayed emptying subjectively, but normal  analysis-77% of tracer emptied at 2 hours   JOINT REPLACEMENT     LAPAROSCOPIC APPENDECTOMY N/A 09/15/2012   Procedure: APPENDECTOMY LAPAROSCOPIC;  Surgeon: [name], MD;  Location: [facility];  Service: General;  Laterality: N/A;   LEFT HEART CATHETERIZATION WITH CORONARY ANGIOGRAM N/A 01/05/2014   Procedure: LEFT HEART CATHETERIZATION WITH CORONARY ANGIOGRAM;  Surgeon: [name], MD;  Location[facility] CATH LAB;  Service: Cardiovascular;  Laterality: N/A;   REPLACEMENT TOTAL KNEE Right 05-03-06   TEE WITHOUT CARDIOVERSION N/A 03/17/2014   Procedure: TRANSESOPHAGEAL ECHOCARDIOGRAM (TEE);  Surgeon: [name], MD;  Location: [facility] ENDOSCOPY;  Service: Cardiovascular;  Laterality: N/A;   TONSILLECTOMY  1972      Current Facility-Administered Medications:    0.9 %  sodium chloride infusion, , Intravenous, Continuous, Emokpae, Ejiroghene E, MD, Last Rate: 75 mL/hr at 08/03/21 0300, Infusion Verify at 08/03/21 0300   acetaminophen (TYLENOL) tablet 650 mg, 650 mg, Oral, Q6H PRN **OR** acetaminophen (TYLENOL) suppository 650 mg, 650 mg, Rectal, Q6H PRN, Emokpae, Ejiroghene E, MD   aspirin chewable tablet 324 mg, 324 mg, Oral, Once, Emokpae, Ejiroghene E, MD   atorvastatin (LIPITOR) tablet 80 mg, 80 mg, Oral, QPM, Emokpae, Ejiroghene E, MD   heparin ADULT infusion 100 units/mL (25000 units/215mL), 1,050 Units/hr, Intravenous, Continuous, Emokpae, Ejiroghene E, MD   insulin aspart (novoLOG) injection 0-15 Units, 0-15 Units, Subcutaneous, TID WC, Emokpae,  Ejiroghene E, MD, 3 Units at 08/03/21 0802   insulin aspart (novoLOG) injection 0-5 Units, 0-5 Units, Subcutaneous, QHS, Emokpae, Ejiroghene E, MD, 3 Units at 08/02/21 2151   insulin glargine-yfgn (SEMGLEE) injection 20 Units, 20 Units, Subcutaneous, QHS, Emokpae, Ejiroghene E, MD, 20 Units at 08/02/21 2150   ondansetron (ZOFRAN) tablet 4 mg, 4 mg, Oral, Q6H PRN **OR** ondansetron (ZOFRAN) injection 4 mg, 4 mg, Intravenous, Q6H PRN, Emokpae, Ejiroghene E, MD   polyethylene glycol (MIRALAX / GLYCOLAX) packet 17 g, 17 g, Oral, Daily PRN, Emokpae, Ejiroghene E, MD  aspirin  324 mg Oral Once   atorvastatin  80 mg Oral QPM   insulin aspart  0-15 Units Subcutaneous TID WC   insulin aspart  0-5 Units Subcutaneous QHS   insulin glargine-yfgn  20 Units Subcutaneous QHS    sodium chloride 75 mL/hr at 08/03/21 0300   heparin      Physical Exam: Blood pressure [name])  138/58, pulse (!) 55, temperature 98 F (36.7 C), resp. rate 18, height 5\' 6"  (1.676 m), weight 74 kg, SpO2 98 %.    Affect appropriate Healthy:  appears stated age 65: normal Neck supple with no adenopathy JVP normal no bruits no thyromegaly Lungs clear with no wheezing and good diaphragmatic motion Heart:  S1/S2 no murmur, no rub, gallop or click PMI normal Abdomen: benighn, BS positve, no tenderness, no AAA no bruit.  No HSM or HJR Distal pulses intact with no bruits No edema Neuro non-focal Skin warm and dry No muscular weakness   Labs:   Lab Results  Component Value Date   WBC 5.8 08/03/2021   HGB 11.0 (L) 08/03/2021   HCT 31.2 (L) 08/03/2021   MCV 80.2 08/03/2021   PLT 167 08/03/2021    Recent Labs  Lab 08/02/21 1320 08/03/21 0005  NA 133* 140  K 4.0 4.5  CL 101 108  CO2 20* 25  BUN 41* 39*  CREATININE 1.71* 1.57*  CALCIUM 9.2 9.4  PROT 6.0*  --   BILITOT 0.5  --   ALKPHOS 52  --   ALT 18  --   AST 24  --   GLUCOSE 362* 304*   Lab Results  Component Value Date   CKTOTAL 117 11/07/2020   CKMB  3.9 05/06/2019   TROPONINI <0.03 01/19/2017    Lab Results  Component Value Date   CHOL 144 01/16/2021   CHOL 128 06/09/2020   CHOL 128 05/04/2019   Lab Results  Component Value Date   HDL 69 01/16/2021   HDL 61 06/09/2020   HDL 51 05/04/2019   Lab Results  Component Value Date   LDLCALC 63 01/16/2021   LDLCALC 52 06/09/2020   LDLCALC 57 05/04/2019   Lab Results  Component Value Date   TRIG 61 01/16/2021   TRIG 77 06/09/2020   TRIG 99 05/04/2019   Lab Results  Component Value Date   CHOLHDL 2.1 01/16/2021   CHOLHDL 2.1 06/09/2020   CHOLHDL 2.5 05/04/2019   No results found for: LDLDIRECT    Radiology: DG Chest 2 View  Result Date: 08/02/2021 CLINICAL DATA:  chest pain EXAM: CHEST - 2 VIEW COMPARISON:  06/11/2021. FINDINGS: Slight mild elevation the right hemidiaphragm, unchanged. No consolidation. No visible pleural effusions or pneumothorax. Similar cardiomediastinal silhouette. IMPRESSION: No evidence of acute cardiopulmonary disease. Electronically Signed   By: Margaretha Sheffield M.D.   On: 08/02/2021 13:47   LONG TERM MONITOR (3-14 DAYS)  Result Date: 07/06/2021 Patch Wear Time:  14 days and 0 hours (2022-12-21T16:26:39-0500 to 2023-01-04T16:26:43-0500) Patient had a min HR of 41 bpm, max HR of 169 bpm, and avg HR of 61 bpm. Predominant underlying rhythm was Sinus Rhythm. 79 Supraventricular Tachycardia runs occurred, the run with the fastest interval lasting 4 beats with a max rate of 169 bpm, the longest lasting 26.5 secs with an avg rate of 95 bpm. Idioventricular Rhythm was present. Isolated SVEs were occasional (1.4%, 16543), SVE Couplets were rare (<1.0%, 477), and SVE Triplets were rare (<1.0%, 117). Isolated VEs were occasional (1.5%, 18318), VE Couplets were rare (<1.0%, 211), and VE Triplets were rare (<1.0%, 5). Ventricular Bigeminy and Trigeminy were present. Multiple brief SVT episodes as well as PVC's, occasionally in bigeminy and trigeminy. [name],  MD, [facility], [place] Director of the [facility] Lipid Disorders [facility] Cardiovascular [facility] Diplomate of the American Board of Clinical Lipidology Attending Cardiologist Direct  Dial: [phone]   Fax: [phone] Website:  [email or web address]   VAS US CAROTID  Result Date: 07/29/2021 Carotid Arterial Duplex Study Patient Name:  MARWAH DISBRO [facility]  Date of Exam:   07/28/2021 Medical Rec #: [id number]             Accession #:    [id number] Date of Birth: February 06, 1946             Patient Gender: F Patient Age:   58 years Exam Location:  [facility] Procedure:      VAS US CAROTID Referring Phys: Chrissie Noa HILTY --------------------------------------------------------------------------------  Indications:       Carotid artery disease with right endarterectomy in 2018.                    Patient has some musculoskeletal neck pain but no                    cerebrovascular symptoms. Risk Factors:      Hypertension, hyperlipidemia, Diabetes, no history of                    smoking, prior MI, coronary artery disease, prior CVA. Comparison Study:  Previous carotid duplex performed 07/28/2020 showed RICA                    velocities of 50/09 cm/sec and LICA velocities of 381/82                    cm/sec. Performing Technologist: Mariane Masters RVT  Examination Guidelines: A complete evaluation includes B-mode imaging, spectral Doppler, color Doppler, and power Doppler as needed of all accessible portions of each vessel. Bilateral testing is considered an integral part of a complete examination. Limited examinations for reoccurring indications may be performed as noted.  Right Carotid Findings: +----------+--------+--------+--------+------------------+------------------+             PSV cm/s EDV cm/s Stenosis Plaque Description Comments            +----------+--------+--------+--------+------------------+------------------+  CCA Prox   108      11                                                        +----------+--------+--------+--------+------------------+------------------+  CCA Distal 77       15                                                       +----------+--------+--------+--------+------------------+------------------+  ICA Prox   54       11                                   intimal thickening  +----------+--------+--------+--------+------------------+------------------+  ICA Mid    93       27                                                       +----------+--------+--------+--------+------------------+------------------+  ICA Distal 74       21                                                       +----------+--------+--------+--------+------------------+------------------+  ECA        137      1                                                        +----------+--------+--------+--------+------------------+------------------+ +----------+--------+-------+----------------+-------------------+             PSV cm/s EDV cms Describe         Arm Pressure (mmHG)  +----------+--------+-------+----------------+-------------------+  Subclavian 130              Multiphasic, WNL 140                  +----------+--------+-------+----------------+-------------------+ +---------+--------+--+--------+--+---------+  Vertebral PSV cm/s 57 EDV cm/s 11 Antegrade  +---------+--------+--+--------+--+---------+  Left Carotid Findings: +----------+--------+--------+--------+------------------+--------+             PSV cm/s EDV cm/s Stenosis Plaque Description Comments  +----------+--------+--------+--------+------------------+--------+  CCA Prox   73       8                                              +----------+--------+--------+--------+------------------+--------+  CCA Distal 65       13                                             +----------+--------+--------+--------+------------------+--------+  ICA Prox   88       18                heterogenous                  +----------+--------+--------+--------+------------------+--------+  ICA Mid    105      30       1-39%                                 +----------+--------+--------+--------+------------------+--------+  ICA Distal 72       15                                             +----------+--------+--------+--------+------------------+--------+  ECA        92       5                                              +----------+--------+--------+--------+------------------+--------+ +----------+--------+--------+----------------+-------------------+             PSV cm/s EDV cm/s Describe  Arm Pressure (mmHG)  +----------+--------+--------+----------------+-------------------+  Subclavian 124               Multiphasic, WNL 140                  +----------+--------+--------+----------------+-------------------+ +---------+--------+--+--------+--+---------+  Vertebral PSV cm/s 66 EDV cm/s 15 Antegrade  +---------+--------+--+--------+--+---------+   Summary: Right Carotid: There is no evidence of stenosis in the right ICA. The                extracranial vessels were near-normal with only minimal wall                thickening or plaque, s/p endarterectomy. Left Carotid: Velocities in the left ICA are consistent with a 1-39% stenosis. Vertebrals:  Bilateral vertebral arteries demonstrate antegrade flow. Subclavians: Normal flow hemodynamics were seen in bilateral subclavian              arteries. *See table(s) above for measurements and observations. Suggest follow up study in 12 months. Electronically signed by Kathlyn Sacramento MD on 07/29/2021 at 6:10:00 PM.    Final     EKG: See HPI   ASSESSMENT AND PLAN:   SEMI:  known CTO of RCA 2015 with moderate LAD dx SEMI with chest pain No acute ECG changes Transfer to [facility] for cath in am as she had eliquis 2 doses yesterday Transition to heparin No beta blocker with SSS and bradycardia Risks including stroke, MI, surgery , bleeding contrast reaction discussed willing to  proceed PAF:  NSR hold eliquis  CRF:  Normal EF hydrate tonight on pre cath orders  Anemia :  stable    Signed: Jenkins Rouge 08/03/2021, 8:36 AM

## 2021-08-03 NOTE — Progress Notes (Signed)
[place] for Heparin Indication: chest pain/ACS  Allergies  Allergen Reactions   Penicillins Hives    Has patient had a PCN reaction causing immediate rash, facial/tongue/throat swelling, SOB or lightheadedness with hypotension: no Has patient had a PCN reaction causing severe rash involving mucus membranes or skin necrosis: No  Has patient had a PCN reaction that required hospitalization: no Has patient had a PCN reaction occurring within the last 10 years: no If all of the above answers are "NO", then may proceed with Cephalosporin use.     Patient Measurements: Height: 5\' 6"  (167.6 cm) Weight: 74 kg (163 lb 1.6 oz) IBW/kg (Calculated) : 59.3   Vital Signs: Temp: 99.2 F (37.3 C) (02/08 1448) Temp Source: Oral (02/08 1448) BP: 136/58 (02/08 1448) Pulse Rate: 60 (02/08 1448)  Labs: Recent Labs    08/02/21 1320 08/02/21 1522 08/02/21 2131 08/03/21 0005 08/03/21 0455 08/03/21 1631  HGB 11.5*  --   --  11.0*  --   --   HCT 33.1*  --   --  31.2*  --   --   PLT 164  --   --  167  --   --   APTT  --   --   --   --   --  64*  HEPARINUNFRC  --   --   --   --   --  >1.10*  CREATININE 1.71*  --   --  1.57*  --   --   TROPONINIHS 36*   < > 1,022* 1,014* 946*  --    < > = values in this interval not displayed.     Estimated Creatinine Clearance: 31.4 mL/min (A) (by C-G formula based on SCr of 1.57 mg/dL (H)).  Assessment: 76 year old female to begin heparin for ACS No anti-coag meds PTA. Plan to cath 2/9. APTT to monitor until correlates with HL, since Eliquis dose given Scr 1.7 Received Eliquis at 10 pm APTT 64, just below goal HL >1.1  Goal of Therapy:  APTT 66-102 Heparin level 0.3-0.7 units/ml Monitor platelets by anticoagulation protocol: Yes   Plan:  Increase Heparin drip to 1150 units / hr  APTT in 8 hours and APTT/Heparin level daily Daily heparin level, CBC  Thank you Isac Sarna, BS Vena Austria, BCPS Clinical  Pharmacist Pager [phone] 08/03/2021,5:31 PM

## 2021-08-03 NOTE — Progress Notes (Signed)
EKG done and given to nurse 

## 2021-08-03 NOTE — Assessment & Plan Note (Addendum)
BP fairly stable.No beta-blocker due to SSS.  Her meds including hydralazine Entresto lisinopril and Lasix currently on hold

## 2021-08-03 NOTE — Progress Notes (Addendum)
[place] for Heparin Indication: chest pain/ACS  Allergies  Allergen Reactions   Penicillins Hives    Has patient had a PCN reaction causing immediate rash, facial/tongue/throat swelling, SOB or lightheadedness with hypotension: no Has patient had a PCN reaction causing severe rash involving mucus membranes or skin necrosis: No  Has patient had a PCN reaction that required hospitalization: no Has patient had a PCN reaction occurring within the last 10 years: no If all of the above answers are "NO", then may proceed with Cephalosporin use.     Patient Measurements: Height: 5\' 6"  (167.6 cm) Weight: 74 kg (163 lb 1.6 oz) IBW/kg (Calculated) : 59.3   Vital Signs: Temp: 98.2 F (36.8 C) (02/07 2150) Temp Source: Oral (02/07 1900) BP: 133/54 (02/07 2150) Pulse Rate: 73 (02/07 2150)  Labs: Recent Labs    08/02/21 1320 08/02/21 1522 08/02/21 2131  HGB 11.5*  --   --   HCT 33.1*  --   --   PLT 164  --   --   CREATININE 1.71*  --   --   TROPONINIHS 36* 127* 1,022*    Estimated Creatinine Clearance: 28.8 mL/min (A) (by C-G formula based on SCr of 1.71 mg/dL (H)).  Assessment: 76 year old female to begin heparin for ACS No anti-coag meds PTA Scr 1.7 Received Eliquis at 10 pm  Goal of Therapy:  Heparin level 0.3-0.7 units/ml Monitor platelets by anticoagulation protocol: Yes   Plan:  NO bolus -- Eliquis at 10 pm Heparin drip at 1050 units / hr starting at 8 am Heparin level and CBC 8 hours after heparin begins Daily heparin level, CBC  Thank you Anette Guarneri, PharmD   08/03/2021,12:17 AM

## 2021-08-03 NOTE — Progress Notes (Signed)
Inpatient Diabetes Program Recommendations  AACE/ADA: New Consensus Statement on Inpatient Glycemic Control (2015)  Target Ranges:  Prepandial:   less than 140 mg/dL      Peak postprandial:   less than 180 mg/dL (1-2 hours)      Critically ill patients:  140 - 180 mg/dL   Lab Results  Component Value Date   GLUCAP 316 (H) 08/03/2021   HGBA1C 8.1 (H) 06/11/2021    Review of Glycemic Control  Latest Reference Range & Units 06/15/21 08:04 06/15/21 11:40 08/02/21 13:24 08/02/21 21:50 08/03/21 07:32 08/03/21 11:05  Glucose-Capillary 70 - 99 mg/dL 184 (H) 369 (H) 357 (H) 288 (H) 157 (H) 316 (H)  (H): Data is abnormally high  Inpatient Diabetes Program Recommendations:   Please consider: -Add Novolog 4 units tid meal coverage if eats 50% Secure chat sent to Dr. Antonieta Pert.  Thank you, Nani Gasser. Tjuana Vickrey, RN, MSN, CDE  Diabetes Coordinator Inpatient Glycemic Control Team Team Pager [phone] (8am-5pm) 08/03/2021 11:51 AM

## 2021-08-03 NOTE — Assessment & Plan Note (Addendum)
Creatinine improving/stable.baseline around 1.2 -1.4 in December  2022. monitor closely Recent Labs  Lab 08/02/21 1320 08/03/21 0005 08/04/21 0638  BUN 41* 39* 29*  CREATININE 1.71* 1.57* 1.33*

## 2021-08-03 NOTE — Assessment & Plan Note (Addendum)
With hypoglycemia in 49 resolved with treatment.  DC Premeal insulin, keep on SSI wildly cut down Lantus to 10 units, normally on 32 units Tresiba bedtime. Last A1c 8 range stable.  Suspect bilateral neuropathy patient endorses numbness tingling in all 4 extremities intermittently at home Recent Labs  Lab 08/03/21 2135 08/03/21 2211 08/03/21 2300 08/03/21 2356 08/04/21 0722  GLUCAP 49* 77 156* 165* 98

## 2021-08-03 NOTE — Assessment & Plan Note (Addendum)
Patient with known CAD with CTO of RCA in 2015 and moderate LAD disease: Concerning for ACS- NSEMI, no acute EKG changes.  Appreciate cardiology input, continue heparin drip Eliquis on hold since 2/8 a.m. Continue aspirin 81 Lipitor 83.  No beta-blocker due to SSS with bradycardia risk.

## 2021-08-03 NOTE — Assessment & Plan Note (Signed)
S/p right enterectomy history, no stenosis recent carotid Doppler done on 07/28/2021 by Dr. Debara Pickett.  Continue home meds

## 2021-08-03 NOTE — Progress Notes (Signed)
PROGRESS NOTE Cassandra Adams  WEX:[id number] DOB: [birth year] DOA: 08/02/2021 PCP: Sharilyn Sites, MD   Brief Narrative/Hospital Course: Cassandra Adams, 76 y.o. female 76 y.o. f w/ chronic diastolic CHF, atrial fibrillation, coronary artery disease-recent hospitalization 12/17 - 12/21 for hypoglycemia and bradycardia, CKD 3, diabetes mellitus hypertension, p/w  left sided chest pain that started about 12  on 2/7on walking from her bathroom to her bedroom , could not catch her breath, but that has since resolved on arrival to ED. Her blood pressure was 94/56 when she checked in the morning.  Her weight is unchanged. In the ED, hypotensive 84/48 improved with 1 L normal saline, troponin slightly positive  36> 127  chest x-ray clear EKG with T wave inversions, patient was admitted. Patient is admitted overnight seen by cardiology on consult next morning. History of CAD with CTO RCA by cath in 2015 has not been cath since then. Her troponin bump and her chest pain in the setting of previous abnormal CAD concerning about ACS, cardiology switched her Eliquis to heparin drip and advised transfer to [facility] for cardiac cath in a.m.    Subjective: Seen and examined this morning.  Denies no chest pain family at the bedside Saturating well on room air. Overnight troponin peaked 36>127>1022> 1014> 946  Assessment and Plan: * Chest pain- (present on admission) Patient with known CAD with CTO of RCA in 2015 and moderate LAD disease: Concerning for ACS no acute EKG changes.  Discussed with cardiology plan to transfer to [facility] for cardiac cath.  Continue heparin drip holding Eliquis had 2 doses 2/7.  No beta-blocker with assessment and bradycardia risk.  Status post aspirin load.  Continue aspirin 81, Lipitor 83  CAD (coronary artery disease)- (present on admission) See #1-chest pain.  Stage 3b chronic kidney disease (CKD) ([place])- (present on admission) Creatinine stable 1.5 .baseline around  1.2 -1.4 in December  2022. monitor closely Recent Labs  Lab 08/02/21 1320 08/03/21 0005  BUN 41* 39*  CREATININE 1.71* 1.57*    Carotid stenosis, right- (present on admission) S/p right enterectomy history, no stenosis recent carotid Doppler done on 07/28/2021 by Dr. Debara Pickett.  Continue home meds  PAF (paroxysmal atrial fibrillation) ([place])- (present on admission) Currently normal sinus rhythm.  Holding Eliquis and now on heparin for ACS.  Essential hypertension, benign- (present on admission) BP fairly controlled.  No beta-blocker due to SSS  Type 2 diabetes mellitus with diabetic neuropathy ([place])- (present on admission) Blood sugar poorly controlled.  Last A1c 8.12 months ago.  Takes 32 units Basaglar at bedtime.  Cont SSI, continue lower-dose insulin for n.p.o. past midnight. Recent Labs  Lab 08/02/21 1324 08/02/21 2150 08/03/21 0732 08/03/21 1105  GLUCAP 357* 288* 157* 316*    DVT prophylaxis: SCDs Start: 08/02/21 1901 heparin drip Code Status:   Code Status: Full Code Family Communication: plan of care discussed with patient/family members at bedside.  Disposition: Currently not medically stable for discharge. Status is: Inpatient Remains inpatient appropriate because: Management of her ACS Being transferred to [facility] for cardiac cath in the morning. Dr Cathlean Sauer notified for transfer at [facility]  Objective: Vitals last 24 hrs: Vitals:   08/02/21 1900 08/02/21 2150 08/03/21 0148 08/03/21 0502  BP: (!) 175/67 (!) 133/54 128/60 (!) 138/58  Pulse: 64 73 68 (!) 55  Resp: 20 19 18 18   Temp: 98.1 F (36.7 C) 98.2 F (36.8 C) 98.1 F (36.7 C) 98 F (36.7 C)  TempSrc: Oral  Oral  SpO2: 100% 98% 99% 98%  Weight: 74 kg     Height: 5\' 6"  (1.676 m)      Weight change:   Physical Examination: General exam: AA,older than stated age, weak appearing. HEENT:Oral mucosa moist, Ear/Nose WNL grossly, dentition normal. Respiratory system: bilaterally diminished BS, no use of  accessory muscle Cardiovascular system: S1 & S2 +, No JVD,. Gastrointestinal system: Abdomen soft,NT,ND, BS+ Nervous System:Alert, awake, moving extremities and grossly nonfocal Extremities: LE edema none,distal peripheral pulses palpable.  Skin: No rashes,no icterus. MSK: Normal muscle bulk,tone, power  Medications reviewed:  Scheduled Meds:  aspirin EC  81 mg Oral Daily   atorvastatin  80 mg Oral QPM   insulin aspart  0-15 Units Subcutaneous TID WC   insulin aspart  0-5 Units Subcutaneous QHS   insulin glargine-yfgn  20 Units Subcutaneous QHS   Continuous Infusions:  heparin 1,050 Units/hr (08/03/21 0854)      Diet Order             Diet NPO time specified Except for: Sips with Meds  Diet effective midnight           Diet NPO time specified Except for: Sips with Meds  Diet effective midnight           Diet heart healthy/carb modified Room service appropriate? Yes; Fluid consistency: Thin  Diet effective now                            Intake/Output Summary (Last 24 hours) at 08/03/2021 1131 Last data filed at 08/03/2021 0742 Gross per 24 hour  Intake 1672.61 ml  Output 1600 ml  Net 72.61 ml   Net IO Since Admission: 72.61 mL [08/03/21 1131]  Wt Readings from Last 3 Encounters:  08/02/21 74 kg  06/30/21 79.8 kg  06/11/21 78.5 kg     Unresulted Labs (From admission, onward)     Start     Ordered   08/04/21 0500  Heparin level (unfractionated)  Daily,   R      08/03/21 0021   08/04/21 0500  CBC  Daily,   R      08/03/21 0021   08/03/21 1600  Heparin level (unfractionated)  Once,   R       See Hyperspace for full Linked Orders Report.   08/03/21 0031   08/03/21 1600  APTT  Once,   R       See Hyperspace for full Linked Orders Report.   08/03/21 0031   08/02/21 1323  CBC with Differential/Platelet  Once,   STAT        08/02/21 1322          Data Reviewed: I have personally reviewed following labs and imaging studies CBC: Recent Labs  Lab  08/02/21 1320 08/03/21 0005  WBC 3.5* 5.8  HGB 11.5* 11.0*  HCT 33.1* 31.2*  MCV 80.9 80.2  PLT 164 962   Basic Metabolic Panel: Recent Labs  Lab 08/02/21 1320 08/03/21 0005  NA 133* 140  K 4.0 4.5  CL 101 108  CO2 20* 25  GLUCOSE 362* 304*  BUN 41* 39*  CREATININE 1.71* 1.57*  CALCIUM 9.2 9.4   GFR: Estimated Creatinine Clearance: 31.4 mL/min (A) (by C-G formula based on SCr of 1.57 mg/dL (H)). Liver Function Tests: Recent Labs  Lab 08/02/21 1320  AST 24  ALT 18  ALKPHOS 52  BILITOT 0.5  PROT 6.0*  ALBUMIN 3.3*  No results for input(s): LIPASE, AMYLASE in the last 168 hours. No results for input(s): AMMONIA in the last 168 hours. Coagulation Profile: No results for input(s): INR, PROTIME in the last 168 hours. Cardiac Enzymes: No results for input(s): CKTOTAL, CKMB, CKMBINDEX, TROPONINI in the last 168 hours. BNP (last 3 results) No results for input(s): PROBNP in the last 8760 hours. HbA1C: No results for input(s): HGBA1C in the last 72 hours. CBG: Recent Labs  Lab 08/02/21 1324 08/02/21 2150 08/03/21 0732 08/03/21 1105  GLUCAP 357* 288* 157* 316*   Lipid Profile: No results for input(s): CHOL, HDL, LDLCALC, TRIG, CHOLHDL, LDLDIRECT in the last 72 hours. Thyroid Function Tests: No results for input(s): TSH, T4TOTAL, FREET4, T3FREE, THYROIDAB in the last 72 hours. Anemia Panel: No results for input(s): VITAMINB12, FOLATE, FERRITIN, TIBC, IRON, RETICCTPCT in the last 72 hours. Sepsis Labs: No results for input(s): PROCALCITON, LATICACIDVEN in the last 168 hours.  Recent Results (from the past 240 hour(s))  Resp Panel by RT-PCR (Flu A&B, Covid) Nasopharyngeal Swab     Status: None   Collection Time: 08/02/21  5:30 PM   Specimen: Nasopharyngeal Swab; Nasopharyngeal(NP) swabs in vial transport medium  Result Value Ref Range Status   SARS Coronavirus 2 by RT PCR NEGATIVE NEGATIVE Final    Comment: (NOTE) SARS-CoV-2 target nucleic acids are NOT  DETECTED.  The SARS-CoV-2 RNA is generally detectable in upper respiratory specimens during the acute phase of infection. The lowest concentration of SARS-CoV-2 viral copies this assay can detect is 138 copies/mL. A negative result does not preclude SARS-Cov-2 infection and should not be used as the sole basis for treatment or other patient management decisions. A negative result may occur with  improper specimen collection/handling, submission of specimen other than nasopharyngeal swab, presence of viral mutation(s) within the areas targeted by this assay, and inadequate number of viral copies(<138 copies/mL). A negative result must be combined with clinical observations, patient history, and epidemiological information. The expected result is Negative.  Fact Sheet for Patients:  EntrepreneurPulse.com.au  Fact Sheet for Healthcare Providers:  IncredibleEmployment.be  This test is no t yet approved or cleared by the [place] FDA and  has been authorized for detection and/or diagnosis of SARS-CoV-2 by FDA under an Emergency Use Authorization (EUA). This EUA will remain  in effect (meaning this test can be used) for the duration of the COVID-19 declaration under Section 564(b)(1) of the Act, 21 U.S.C.section 360bbb-3(b)(1), unless the authorization is terminated  or revoked sooner.       Influenza A by PCR NEGATIVE NEGATIVE Final   Influenza B by PCR NEGATIVE NEGATIVE Final    Comment: (NOTE) The Xpert Xpress SARS-CoV-2/FLU/RSV plus assay is intended as an aid in the diagnosis of influenza from Nasopharyngeal swab specimens and should not be used as a sole basis for treatment. Nasal washings and aspirates are unacceptable for Xpert Xpress SARS-CoV-2/FLU/RSV testing.  Fact Sheet for Patients: EntrepreneurPulse.com.au  Fact Sheet for Healthcare Providers: IncredibleEmployment.be  This test is not yet  approved or cleared by the [place] FDA and has been authorized for detection and/or diagnosis of SARS-CoV-2 by FDA under an Emergency Use Authorization (EUA). This EUA will remain in effect (meaning this test can be used) for the duration of the COVID-19 declaration under Section 564(b)(1) of the Act, 21 U.S.C. section 360bbb-3(b)(1), unless the authorization is terminated or revoked.  Performed at [facility], [address], [place], [place] [address]     Antimicrobials: Anti-infectives (From admission, onward)  None      Culture/Microbiology    Component Value Date/Time   SDES  06/11/2021 1505    IN/OUT CATH URINE Performed at [facility], [address] [place], [place] [address]    [facility]  06/11/2021 1505    NONE Performed at [facility], [address] [place], [place] [address]    CULT  06/11/2021 1505    NO GROWTH Performed at [facility] [address], [place], [place] [address]    REPTSTATUS 06/13/2021 FINAL 06/11/2021 1505    Other culture-see note  Radiology Studies: DG Chest 2 View  Result Date: 08/02/2021 CLINICAL DATA:  chest pain EXAM: CHEST - 2 VIEW COMPARISON:  06/11/2021. FINDINGS: Slight mild elevation the right hemidiaphragm, unchanged. No consolidation. No visible pleural effusions or pneumothorax. Similar cardiomediastinal silhouette. IMPRESSION: No evidence of acute cardiopulmonary disease. Electronically Signed   By: Margaretha Sheffield M.D.   On: 08/02/2021 13:47     LOS: 0 days   Antonieta Pert, MD [facility]  08/03/2021, 11:31 AM

## 2021-08-03 NOTE — Assessment & Plan Note (Addendum)
In NSR. on heparin.  Eliquis on hold

## 2021-08-03 NOTE — Assessment & Plan Note (Signed)
See #1-chest pain.

## 2021-08-03 NOTE — Hospital Course (Addendum)
76 y.o. f w/ chronic diastolic CHF, atrial fibrillation, coronary artery disease-recent hospitalization 12/17 - 12/21 for hypoglycemia and bradycardia, CKD 3, diabetes mellitus hypertension, p/w  left sided chest pain that started about 12  on 2/7on walking from her bathroom to her bedroom , could not catch her breath, but that has since resolved on arrival to ED. Her blood pressure was 94/56 when she checked in the morning.  Her weight is unchanged. In the ED, hypotensive 84/48 improved with 1 L normal saline, troponin slightly positive  36> 127  chest x-ray clear EKG with T wave inversions, patient was admitted. Patient is admitted overnight seen by cardiology on consult next morning. History of CAD with CTO RCA by cath in 2015 has not been cath since then. Her troponin bump and her chest pain in the setting of previous abnormal CAD consistent with NSTEMI cardiology switched her Eliquis to heparin drip and advised transfer to [facility] for cardiac cath on 08/04/21

## 2021-08-03 NOTE — Progress Notes (Addendum)
RN called due to patient complaining of numbness in lips and both hands.  At bedside, patient was in bed and was on the phone.  Patient acknowledged numbness in hands, but she states that it was a chronic problem due to her diabetes.  She states that she was told by her outpatient physician that there is no permanent cure for this.  She states that she usually run her hands under warm water at home with slight improvement in symptoms.  RN will provide warm blanket.  Total time:  8 minutes This includes time reviewing the chart including progress notes, labs, EKGs, taking medical decisions, ordering labs and documenting findings.

## 2021-08-03 NOTE — Progress Notes (Signed)
Notify MD that patient is complaining of numbness in her lips and both hands.

## 2021-08-04 ENCOUNTER — Encounter (HOSPITAL_COMMUNITY)
Admission: EM | Disposition: A | Discharge status: Home / Self Care | Payer: Self-pay | Source: Home / Self Care | Attending: Internal Medicine

## 2021-08-04 ENCOUNTER — Inpatient Hospital Stay (HOSPITAL_COMMUNITY): Payer: Medicare HMO

## 2021-08-04 DIAGNOSIS — I2510 Atherosclerotic heart disease of native coronary artery without angina pectoris: Secondary | ICD-10-CM

## 2021-08-04 DIAGNOSIS — I259 Chronic ischemic heart disease, unspecified: Secondary | ICD-10-CM | POA: Diagnosis not present

## 2021-08-04 DIAGNOSIS — I25110 Atherosclerotic heart disease of native coronary artery with unstable angina pectoris: Secondary | ICD-10-CM

## 2021-08-04 DIAGNOSIS — I214 Non-ST elevation (NSTEMI) myocardial infarction: Secondary | ICD-10-CM | POA: Diagnosis not present

## 2021-08-04 DIAGNOSIS — I480 Paroxysmal atrial fibrillation: Secondary | ICD-10-CM | POA: Diagnosis not present

## 2021-08-04 HISTORY — PX: LEFT HEART CATH AND CORONARY ANGIOGRAPHY: CATH118249

## 2021-08-04 HISTORY — PX: CORONARY PRESSURE/FFR STUDY: CATH118243

## 2021-08-04 LAB — BASIC METABOLIC PANEL
Anion gap: 2 — ABNORMAL LOW (ref 5–15)
BUN: 29 mg/dL — ABNORMAL HIGH (ref 8–23)
CO2: 26 mmol/L (ref 22–32)
Calcium: 9 mg/dL (ref 8.9–10.3)
Chloride: 112 mmol/L — ABNORMAL HIGH (ref 98–111)
Creatinine, Ser: 1.33 mg/dL — ABNORMAL HIGH (ref 0.44–1.00)
GFR, Estimated: 41 mL/min — ABNORMAL LOW (ref >60)
Glucose, Bld: 109 mg/dL — ABNORMAL HIGH (ref 70–99)
Potassium: 4.3 mmol/L (ref 3.5–5.1)
Sodium: 140 mmol/L (ref 135–145)

## 2021-08-04 LAB — ECHOCARDIOGRAM COMPLETE
AR max vel: 2.42 cm2
AV Peak grad: 12.8 mmHg
Ao pk vel: 1.79 m/s
Area-P 1/2: 3.11 cm2
Height: 66 in
S' Lateral: 2.5 cm
Weight: 2609.6 oz

## 2021-08-04 LAB — GLUCOSE, CAPILLARY
Glucose-Capillary: 126 mg/dL — ABNORMAL HIGH (ref 70–99)
Glucose-Capillary: 169 mg/dL — ABNORMAL HIGH (ref 70–99)
Glucose-Capillary: 182 mg/dL — ABNORMAL HIGH (ref 70–99)
Glucose-Capillary: 243 mg/dL — ABNORMAL HIGH (ref 70–99)
Glucose-Capillary: 98 mg/dL (ref 70–99)

## 2021-08-04 LAB — CBC
HCT: 29 % — ABNORMAL LOW (ref 36.0–46.0)
Hemoglobin: 9.8 g/dL — ABNORMAL LOW (ref 12.0–15.0)
MCH: 27.1 pg (ref 26.0–34.0)
MCHC: 33.8 g/dL (ref 30.0–36.0)
MCV: 80.3 fL (ref 80.0–100.0)
Platelets: 146 10*3/uL — ABNORMAL LOW (ref 150–400)
RBC: 3.61 MIL/uL — ABNORMAL LOW (ref 3.87–5.11)
RDW: 14.6 % (ref 11.5–15.5)
WBC: 5 10*3/uL (ref 4.0–10.5)
nRBC: 0 % (ref 0.0–0.2)

## 2021-08-04 LAB — HEPARIN LEVEL (UNFRACTIONATED): Heparin Unfractionated: >1.1 IU/mL — ABNORMAL HIGH (ref 0.30–0.70)

## 2021-08-04 LAB — APTT
aPTT: 137 seconds — ABNORMAL HIGH (ref 24–36)
aPTT: 157 seconds — ABNORMAL HIGH (ref 24–36)

## 2021-08-04 LAB — POCT ACTIVATED CLOTTING TIME: Activated Clotting Time: 269 seconds

## 2021-08-04 SURGERY — LEFT HEART CATH AND CORONARY ANGIOGRAPHY
Anesthesia: LOCAL

## 2021-08-04 MED ORDER — VERAPAMIL HCL 2.5 MG/ML IV SOLN
INTRAVENOUS | Status: AC
  Filled 2021-08-04: qty 2

## 2021-08-04 MED ORDER — CLOPIDOGREL BISULFATE 75 MG PO TABS
75.0000 mg | ORAL_TABLET | Freq: Every day | ORAL | Status: DC
  Administered 2021-08-05 – 2021-08-07 (×3): 75 mg via ORAL
  Filled 2021-08-04 (×3): qty 1

## 2021-08-04 MED ORDER — PANTOPRAZOLE SODIUM 40 MG PO TBEC
40.0000 mg | DELAYED_RELEASE_TABLET | Freq: Every day | ORAL | Status: DC
  Administered 2021-08-04 – 2021-08-07 (×4): 40 mg via ORAL
  Filled 2021-08-04 (×4): qty 1

## 2021-08-04 MED ORDER — FENTANYL CITRATE (PF) 100 MCG/2ML IJ SOLN
INTRAMUSCULAR | Status: DC | PRN
  Administered 2021-08-04: 25 ug via INTRAVENOUS

## 2021-08-04 MED ORDER — HYDRALAZINE HCL 20 MG/ML IJ SOLN
INTRAMUSCULAR | Status: AC
  Filled 2021-08-04: qty 1

## 2021-08-04 MED ORDER — SODIUM CHLORIDE 0.9 % WEIGHT BASED INFUSION: 1.0000 mL/kg/h | INTRAVENOUS | Status: DC

## 2021-08-04 MED ORDER — SODIUM CHLORIDE 0.9% FLUSH
3.0000 mL | Freq: Two times a day (BID) | INTRAVENOUS | Status: DC
  Administered 2021-08-04 – 2021-08-05 (×2): 3 mL via INTRAVENOUS

## 2021-08-04 MED ORDER — SODIUM CHLORIDE 0.9% FLUSH
3.0000 mL | Freq: Two times a day (BID) | INTRAVENOUS | Status: DC
  Administered 2021-08-05: 3 mL via INTRAVENOUS

## 2021-08-04 MED ORDER — LIDOCAINE HCL (PF) 1 % IJ SOLN
INTRAMUSCULAR | Status: DC | PRN
  Administered 2021-08-04: 2 mL

## 2021-08-04 MED ORDER — SODIUM CHLORIDE 0.9 % WEIGHT BASED INFUSION
3.0000 mL/kg/h | INTRAVENOUS | Status: DC
  Administered 2021-08-04: 3 mL/kg/h via INTRAVENOUS

## 2021-08-04 MED ORDER — HEPARIN SODIUM (PORCINE) 1000 UNIT/ML IJ SOLN
INTRAMUSCULAR | Status: DC | PRN
  Administered 2021-08-04 (×2): 3500 [IU] via INTRAVENOUS

## 2021-08-04 MED ORDER — SODIUM CHLORIDE 0.9 % IV SOLN: 250.0000 mL | INTRAVENOUS | Status: DC | PRN

## 2021-08-04 MED ORDER — INSULIN GLARGINE-YFGN 100 UNIT/ML ~~LOC~~ SOLN
10.0000 [IU] | Freq: Every day | SUBCUTANEOUS | Status: DC
  Administered 2021-08-04 – 2021-08-06 (×3): 10 [IU] via SUBCUTANEOUS
  Filled 2021-08-04 (×5): qty 0.1

## 2021-08-04 MED ORDER — AMLODIPINE BESYLATE 5 MG PO TABS
5.0000 mg | ORAL_TABLET | Freq: Every day | ORAL | Status: DC
  Administered 2021-08-04 – 2021-08-07 (×4): 5 mg via ORAL
  Filled 2021-08-04 (×4): qty 1

## 2021-08-04 MED ORDER — HEPARIN SODIUM (PORCINE) 1000 UNIT/ML IJ SOLN
INTRAMUSCULAR | Status: AC
  Filled 2021-08-04: qty 10

## 2021-08-04 MED ORDER — ASPIRIN 81 MG PO CHEW
81.0000 mg | CHEWABLE_TABLET | ORAL | Status: AC
  Administered 2021-08-05: 81 mg via ORAL
  Filled 2021-08-04: qty 1

## 2021-08-04 MED ORDER — SODIUM CHLORIDE 0.9% FLUSH: 3.0000 mL | INTRAVENOUS | Status: DC | PRN

## 2021-08-04 MED ORDER — IOHEXOL 350 MG/ML SOLN
INTRAVENOUS | Status: DC | PRN
  Administered 2021-08-04: 60 mL

## 2021-08-04 MED ORDER — LABETALOL HCL 5 MG/ML IV SOLN: 10.0000 mg | INTRAVENOUS | Status: AC | PRN

## 2021-08-04 MED ORDER — FENTANYL CITRATE (PF) 100 MCG/2ML IJ SOLN
INTRAMUSCULAR | Status: AC
  Filled 2021-08-04: qty 2

## 2021-08-04 MED ORDER — SODIUM CHLORIDE 0.9% FLUSH
3.0000 mL | Freq: Two times a day (BID) | INTRAVENOUS | Status: DC
  Administered 2021-08-04 – 2021-08-07 (×5): 3 mL via INTRAVENOUS

## 2021-08-04 MED ORDER — HYDRALAZINE HCL 20 MG/ML IJ SOLN
10.0000 mg | INTRAMUSCULAR | Status: AC | PRN
  Administered 2021-08-04 (×3): 10 mg via INTRAVENOUS
  Filled 2021-08-04 (×3): qty 1

## 2021-08-04 MED ORDER — SODIUM CHLORIDE 0.9 % WEIGHT BASED INFUSION
1.0000 mL/kg/h | INTRAVENOUS | Status: DC
  Administered 2021-08-04: 1 mL/kg/h via INTRAVENOUS

## 2021-08-04 MED ORDER — HEPARIN (PORCINE) IN NACL 1000-0.9 UT/500ML-% IV SOLN
INTRAVENOUS | Status: DC | PRN
  Administered 2021-08-04 (×2): 500 mL

## 2021-08-04 MED ORDER — HEPARIN (PORCINE) 25000 UT/250ML-% IV SOLN
1000.0000 [IU]/h | INTRAVENOUS | Status: DC
  Administered 2021-08-04: 1000 [IU]/h via INTRAVENOUS
  Filled 2021-08-04: qty 250

## 2021-08-04 MED ORDER — MIDAZOLAM HCL 2 MG/2ML IJ SOLN
INTRAMUSCULAR | Status: DC | PRN
  Administered 2021-08-04: 1 mg via INTRAVENOUS

## 2021-08-04 MED ORDER — CLOPIDOGREL BISULFATE 75 MG PO TABS
600.0000 mg | ORAL_TABLET | Freq: Once | ORAL | Status: AC
  Administered 2021-08-04: 600 mg via ORAL
  Filled 2021-08-04: qty 8

## 2021-08-04 MED ORDER — SODIUM CHLORIDE 0.9 % WEIGHT BASED INFUSION: 3.0000 mL/kg/h | INTRAVENOUS | Status: DC

## 2021-08-04 MED ORDER — NITROGLYCERIN 1 MG/10 ML FOR IR/CATH LAB
INTRA_ARTERIAL | Status: DC | PRN
  Administered 2021-08-04: 200 ug via INTRACORONARY

## 2021-08-04 MED ORDER — HYDRALAZINE HCL 20 MG/ML IJ SOLN
10.0000 mg | Freq: Once | INTRAMUSCULAR | Status: AC
  Administered 2021-08-04: 10 mg via INTRAVENOUS

## 2021-08-04 MED ORDER — ASPIRIN EC 81 MG PO TBEC: 81.0000 mg | DELAYED_RELEASE_TABLET | Freq: Every day | ORAL | Status: DC

## 2021-08-04 MED ORDER — SODIUM CHLORIDE 0.9 % IV SOLN: INTRAVENOUS | Status: AC

## 2021-08-04 MED ORDER — LIDOCAINE HCL (PF) 1 % IJ SOLN
INTRAMUSCULAR | Status: AC
  Filled 2021-08-04: qty 30

## 2021-08-04 MED ORDER — VERAPAMIL HCL 2.5 MG/ML IV SOLN
INTRAVENOUS | Status: DC | PRN
  Administered 2021-08-04: 10 mL via INTRA_ARTERIAL
  Administered 2021-08-04: 5 mL via INTRA_ARTERIAL

## 2021-08-04 MED ORDER — NITROGLYCERIN 1 MG/10 ML FOR IR/CATH LAB
INTRA_ARTERIAL | Status: AC
  Filled 2021-08-04: qty 10

## 2021-08-04 MED ORDER — SODIUM CHLORIDE 0.9 % IV SOLN: INTRAVENOUS | Status: DC

## 2021-08-04 MED ORDER — MIDAZOLAM HCL 2 MG/2ML IJ SOLN
INTRAMUSCULAR | Status: AC
  Filled 2021-08-04: qty 2

## 2021-08-04 SURGICAL SUPPLY — 12 items
CATH LAUNCHER 6FR EBU3.5 (CATHETERS) ×1 IMPLANT
CATH OPTITORQUE TIG 4.0 5F (CATHETERS) ×1 IMPLANT
DEVICE RAD COMP TR BAND LRG (VASCULAR PRODUCTS) ×1 IMPLANT
GLIDESHEATH SLEND SS 6F .021 (SHEATH) ×1 IMPLANT
GUIDEWIRE INQWIRE 1.5J.035X260 (WIRE) IMPLANT
GUIDEWIRE PRESSURE X 175 (WIRE) ×1 IMPLANT
INQWIRE 1.5J .035X260CM (WIRE) ×2
KIT ESSENTIALS PG (KITS) ×1 IMPLANT
KIT HEART LEFT (KITS) ×3 IMPLANT
PACK CARDIAC CATHETERIZATION (CUSTOM PROCEDURE TRAY) ×3 IMPLANT
TRANSDUCER W/STOPCOCK (MISCELLANEOUS) ×3 IMPLANT
TUBING CIL FLEX 10 FLL-RA (TUBING) ×3 IMPLANT

## 2021-08-04 NOTE — Interval H&P Note (Signed)
History and Physical Interval Note:  08/04/2021 11:42 AM  Cassandra Adams  has presented today for surgery, with the diagnosis of NSTEMI.  The various methods of treatment have been discussed with the patient and family. After consideration of risks, benefits and other options for treatment, the patient has consented to  Procedure(s): LEFT HEART CATH AND CORONARY ANGIOGRAPHY (N/A) as a surgical intervention.  The patient's history has been reviewed, patient examined, no change in status, stable for surgery.  I have reviewed the patient's chart and labs.  Questions were answered to the patient's satisfaction.    Cath Lab Visit (complete for each Cath Lab visit)  Clinical Evaluation Leading to the Procedure:   ACS: Yes.    Non-ACS:  N/A  Lurlie Wigen

## 2021-08-04 NOTE — Progress Notes (Signed)
Heparin turned off. To cath lab

## 2021-08-04 NOTE — Progress Notes (Signed)
[place] for Heparin Indication: chest pain/ACS  Allergies  Allergen Reactions   Penicillins Hives    Has patient had a PCN reaction causing immediate rash, facial/tongue/throat swelling, SOB or lightheadedness with hypotension: no Has patient had a PCN reaction causing severe rash involving mucus membranes or skin necrosis: No  Has patient had a PCN reaction that required hospitalization: no Has patient had a PCN reaction occurring within the last 10 years: no If all of the above answers are "NO", then may proceed with Cephalosporin use.     Patient Measurements: Height: 5\' 6"  (167.6 cm) Weight: 74 kg (163 lb 1.6 oz) IBW/kg (Calculated) : 59.3   Vital Signs: Temp: 98.2 F (36.8 C) (02/09 0449) BP: 164/86 (02/09 0449) Pulse Rate: 54 (02/09 0449)  Labs: Recent Labs    08/02/21 1320 08/02/21 1522 08/02/21 2131 08/03/21 0005 08/03/21 0455 08/03/21 1631 08/04/21 0117 08/04/21 0435 08/04/21 0638  HGB 11.5*  --   --  11.0*  --   --   --  9.8*  --   HCT 33.1*  --   --  31.2*  --   --   --  29.0*  --   PLT 164  --   --  167  --   --   --  146*  --   APTT  --   --   --   --   --  64* 137* 157*  --   HEPARINUNFRC  --   --   --   --   --  >1.10*  --  >1.10*  --   CREATININE 1.71*  --   --  1.57*  --   --   --   --  1.33*  TROPONINIHS 36*   < > 1,022* 1,014* 946*  --   --   --   --    < > = values in this interval not displayed.     Estimated Creatinine Clearance: 37 mL/min (A) (by C-G formula based on SCr of 1.33 mg/dL (H)).  Assessment: 76 year old female to begin heparin for ACS No anti-coag meds PTA. Plan to cath 2/9. APTT to monitor until correlates with HL, since Eliquis dose given Scr 1.7 Received Eliquis at 10 pm   2/9 AM update: aPTT elevated  Goal of Therapy:  APTT 66-102 Heparin level 0.3-0.7 units/ml Monitor platelets by anticoagulation protocol: Yes   Plan:  Dec heparin to 1000 units/hr 1500 aPTT and heparin  level  Narda Bonds, PharmD, BCPS Clinical Pharmacist Phone: [phone]

## 2021-08-04 NOTE — Progress Notes (Signed)
Notified MD End of IV hydralazine not maintaining BP < 160 post cath, MD added amlodipine and said if patient not symptomatic and not higher than 160's, this is acceptable for the patient. BP was much higher previously. MD to bedside to see patient also post-cath.   Will continue to monitor.

## 2021-08-04 NOTE — H&P (View-Only) (Signed)
Subjective:  Denies SSCP, palpitations or Dyspnea Ready for cath Eliquis held Cr improved  May be transferred directly to lab   Objective:  Vitals:   08/03/21 0502 08/03/21 1448 08/03/21 2135 08/04/21 0449  BP: (!) 138/58 (!) 136/58 (!) 143/51 (!) 164/86  Pulse: (!) 55 60 (!) 57 (!) 54  Resp: 18  18 18   Temp: 98 F (36.7 C) 99.2 F (37.3 C) 98 F (36.7 C) 98.2 F (36.8 C)  TempSrc:  Oral    SpO2: 98% 98% 99% 98%  Weight:      Height:        Intake/Output from previous day:  Intake/Output Summary (Last 24 hours) at 08/04/2021 2952 Last data filed at 08/04/2021 0600 Gross per 24 hour  Intake 853.78 ml  Output 1100 ml  Net -246.22 ml    Physical Exam: Affect appropriate Healthy:  appears stated age HEENT: normal Neck supple with no adenopathy JVP normal no bruits no thyromegaly Lungs clear with no wheezing and good diaphragmatic motion Heart:  S1/S2 no murmur, no rub, gallop or click PMI normal Abdomen: benighn, BS positve, no tenderness, no AAA no bruit.  No HSM or HJR Distal pulses intact with no bruits No edema Neuro non-focal Skin warm and dry No muscular weakness   Lab Results: Basic Metabolic Panel: Recent Labs    08/03/21 0005 08/04/21 0638  NA 140 140  K 4.5 4.3  CL 108 112*  CO2 25 26  GLUCOSE 304* 109*  BUN 39* 29*  CREATININE 1.57* 1.33*  CALCIUM 9.4 9.0   Liver Function Tests: Recent Labs    08/02/21 1320  AST 24  ALT 18  ALKPHOS 52  BILITOT 0.5  PROT 6.0*  ALBUMIN 3.3*   No results for input(s): LIPASE, AMYLASE in the last 72 hours. CBC: Recent Labs    08/03/21 0005 08/04/21 0435  WBC 5.8 5.0  HGB 11.0* 9.8*  HCT 31.2* 29.0*  MCV 80.2 80.3  PLT 167 146*     Imaging: DG Chest 2 View  Result Date: 08/02/2021 CLINICAL DATA:  chest pain EXAM: CHEST - 2 VIEW COMPARISON:  06/11/2021. FINDINGS: Slight mild elevation the right hemidiaphragm, unchanged. No consolidation. No visible pleural effusions or pneumothorax.  Similar cardiomediastinal silhouette. IMPRESSION: No evidence of acute cardiopulmonary disease. Electronically Signed   By: Margaretha Sheffield M.D.   On: 08/02/2021 13:47    Cardiac Studies:  ECG: SR PAC old IMI inferior lateral T wave changes    Telemetry:  NSR 08/04/2021   Echo:   Medications:    aspirin EC  81 mg Oral Daily   atorvastatin  80 mg Oral QPM   insulin aspart  0-15 Units Subcutaneous TID WC   insulin aspart  0-5 Units Subcutaneous QHS   insulin glargine-yfgn  10 Units Subcutaneous QHS      sodium chloride 3 mL/kg/hr (08/04/21 0615)   Followed by   sodium chloride 1 mL/kg/hr (08/04/21 0730)   heparin 1,000 Units/hr (08/04/21 0730)    Assessment/Plan:   76 y.o. admitted with chest pain and elevated troponin History of CAD with CTO RCA by cath 2015 has not been cathed since SSCP anginal sounding pain free now troponin peak 1022 now coming down ECG SB LAD no acute ST changes On eliquis last dose last night PAF with SSS beta blockers held EF has been preserved 55% by TTE 06/13/21 CRF with Cr stable  1.33   SEMI:  Troponin coming down 1022-> 946 pain free no  acute ST elevation for cath today right radial. On heparin had no eliquis yesterday ? New dx in left system or insufficient left to right collaterals  PAF:  NSR on heparin   Roney Jaffe aware on cath schedule for Dr End NPO  Jenkins Rouge 08/04/2021, 8:08 AM

## 2021-08-04 NOTE — Progress Notes (Signed)
After discussing with the patient around the reason for the Blood Refusal on her chart. The patient states she does want blood if it will safe her life. I spoke to my Assistant Director Dewaine Oats that also spoke with Vaughan Basta from [place] and Providence Lanius from [employer] that said we will just need to get a blood consent to have on her chart during this admission to state if she needs blood she consents to do that. I spoke to Caren Griffins in our holding room. Once patient is back to baseline from sedation during procedure the consent will be signed. If she transferred to the floor before this Mickel Baas states she will give this information to the nurse that will be receiving this patient.

## 2021-08-04 NOTE — Progress Notes (Signed)
Spoke with Lauretta Grill on 6E that the patient now has blood consent added to chart.

## 2021-08-04 NOTE — Progress Notes (Signed)
Echocardiogram 2D Echocardiogram has been performed.  Cassandra Adams 08/04/2021, 3:08 PM

## 2021-08-04 NOTE — Progress Notes (Signed)
Progress Note  Patient Name: Cassandra Adams Date of Encounter: 08/04/2021  [facility] Cardiologist: [name], MD   Subjective   Feeling well s/p cath today other than some pain related to TR band.  Inpatient Medications    Scheduled Meds:  amLODipine  5 mg Oral Daily   aspirin EC  81 mg Oral Daily   atorvastatin  80 mg Oral QPM   [START ON 08/05/2021] clopidogrel  75 mg Oral Q breakfast   hydrALAZINE       insulin aspart  0-15 Units Subcutaneous TID WC   insulin aspart  0-5 Units Subcutaneous QHS   insulin glargine-yfgn  10 Units Subcutaneous QHS   pantoprazole  40 mg Oral Daily   sodium chloride flush  3 mL Intravenous Q12H   Continuous Infusions:  sodium chloride 50 mL/hr (08/04/21 1256)   sodium chloride     PRN Meds: sodium chloride, acetaminophen **OR** acetaminophen, hydrALAZINE, labetalol, ondansetron **OR** ondansetron (ZOFRAN) IV, polyethylene glycol, sodium chloride flush   Vital Signs    Vitals:   08/04/21 1546 08/04/21 1551 08/04/21 1629 08/04/21 1711  BP: (!) 157/58 (!) 160/61 (!) 160/62 (!) 166/76  Pulse:  62 62   Resp:      Temp:      TempSrc:      SpO2:  98% 96%   Weight:      Height:        Intake/Output Summary (Last 24 hours) at 08/04/2021 1715 Last data filed at 08/04/2021 1500 Gross per 24 hour  Intake 790.78 ml  Output 900 ml  Net -109.22 ml   Last 3 Weights 08/02/2021 08/02/2021 06/30/2021  Weight (lbs) 163 lb 1.6 oz 175 lb 14.8 oz 176 lb  Weight (kg) 73.982 kg 79.8 kg 79.833 kg      Telemetry    Sinus rhythm with PVC's - Personally Reviewed  ECG    Sinus bradycardia with nonspecific T wave changes - Personally Reviewed  Physical Exam   GEN: No acute distress.   Cardiac: RRR, no murmurs, rubs, or gallops.  Respiratory: Clear to auscultation bilaterally. MS: No edema; Right radial cath site with TR band in place.  No hematoma.  Labs    High Sensitivity Troponin:   Recent Labs  Lab 08/02/21 1320 08/02/21 1522  08/02/21 2131 08/03/21 0005 08/03/21 0455  TROPONINIHS 36* 127* 1,022* 1,014* 946*     Chemistry Recent Labs  Lab 08/02/21 1320 08/03/21 0005 08/04/21 0638  NA 133* 140 140  K 4.0 4.5 4.3  CL 101 108 112*  CO2 20* 25 26  GLUCOSE 362* 304* 109*  BUN 41* 39* 29*  CREATININE 1.71* 1.57* 1.33*  CALCIUM 9.2 9.4 9.0  PROT 6.0*  --   --   ALBUMIN 3.3*  --   --   AST 24  --   --   ALT 18  --   --   ALKPHOS 52  --   --   BILITOT 0.5  --   --   GFRNONAA 31* 34* 41*  ANIONGAP 12 7 2*    Lipids No results for input(s): CHOL, TRIG, HDL, LABVLDL, LDLCALC, CHOLHDL in the last 168 hours.  Hematology Recent Labs  Lab 08/02/21 1320 08/03/21 0005 08/04/21 0435  WBC 3.5* 5.8 5.0  RBC 4.09 3.89 3.61*  HGB 11.5* 11.0* 9.8*  HCT 33.1* 31.2* 29.0*  MCV 80.9 80.2 80.3  MCH 28.1 28.3 27.1  MCHC 34.7 35.3 33.8  RDW 14.6 14.3 14.6  PLT 164 167 146*   Thyroid No results for input(s): TSH, FREET4 in the last 168 hours.  BNP Recent Labs  Lab 08/02/21 1320  BNP 95.0    DDimer No results for input(s): DDIMER in the last 168 hours.   Radiology    CARDIAC CATHETERIZATION  Result Date: 08/04/2021 Conclusions: Severe two-vessel coronary artery disease, including 70% proximal/mid LAD stenosis with heavy calcification that is significant by RFR (0.71), and chronic total occlusion of mid RCA. Normal left ventricular filling pressure. Calcified aorta consistent with "porcelain aorta." Recommendations: Staged PCI/atherectomy of proximal/mid LAD if renal function remains stable. Restart IV heparin 2 hours after TR band removal.  Will load with clopidogrel today in anticipation of PCI tomorrow. Obtain echocardiogram to ensure LVEF is stable. Aggressive secondary prevention of coronary artery disease. Nelva Bush, MD [facility]   Cardiac Studies   See cath above.  Echo pending  Patient Profile     76 y.o. female with h/o CAD (CTO RCA by cath in 2015), hypertension, hyperlipidemia, and  sickle cell trait, admitted with NSTEMI.  Assessment & Plan    NSTEMI: Cath today showed CTO of RCA with collaterals and 70% proximal/mid LAD disease with heavy calcification.  LCx with non-obstructive disease.  Recommend PCI to LAD with atherectomy and medical management of RCA CTO.  Patient not a good candidate for CABG in setting of porcelain aorta noted on cath and CKD.  Patient and daughter ok to proceed with PCI to LAD tomorrow with Dr. Burt Knack. - Load with clopidogrel 600 mg x 1.  Continue IV heparin after TR band off.  Anticipate discharging on ASA, clopidogrel, and apixaban x 1 week, followed by clopidogrel and apixaban for up to 12 months. - Defer BB in setting of bradycardia. - Add amlodipine 5 mg daily for BP and antianginal therapy. - High-intensity statin. - Follow-up echo.  Anemia: Patient with sickle cell trait.  Hemoglobin near her baseline over last year.  Flag noted in Epic that patient has previously refused blood products.  I have discussed rationale behind blood transfusions as well as risks and benefits.  She has agreed to blood transfusion if medically necessary.  RN to have patient sign consent when TR band has been removed.  Acute kidney injury superimposed on chronic kidney disease: Creatinine improving since admission at [facility]. - Avoid nephrotoxic drugs.  Entresto currently on hold in setting of recent AKI. - Gentle hydration post-cath today and gain tomorrow AM in anticipation of PCI.  LVEDP normal on today's cath.  Shared Decision Making/Informed Consent The risks [stroke (1 in 1000), death (1 in 1000), kidney failure [usually temporary] (1 in 500), bleeding (1 in 200), allergic reaction [possibly serious] (1 in 200)], benefits (diagnostic support and management of coronary artery disease) and alternatives of a cardiac catheterization and PCI with atherectomy were discussed in detail with Ms. Schaben and she is willing to proceed.    For questions or  updates, please contact [place] Please consult [email or web address] for contact info under     Signed, Nelva Bush, MD  08/04/2021, 5:15 PM

## 2021-08-04 NOTE — Progress Notes (Signed)
Inpatient Diabetes Program Recommendations  AACE/ADA: New Consensus Statement on Inpatient Glycemic Control (2015)  Target Ranges:  Prepandial:   less than 140 mg/dL      Peak postprandial:   less than 180 mg/dL (1-2 hours)      Critically ill patients:  140 - 180 mg/dL   Lab Results  Component Value Date   GLUCAP 98 08/04/2021   HGBA1C 8.1 (H) 06/11/2021    Review of Glycemic Control  Latest Reference Range & Units 08/03/21 07:32 08/03/21 11:05 08/03/21 16:03 08/03/21 21:35 08/03/21 22:11 08/03/21 23:00 08/03/21 23:56 08/04/21 07:22  Glucose-Capillary 70 - 99 mg/dL 157 (H) Novolog 3 units 316 (H) Novolog 11 units 102 (H) Novolog 4 units 49 (L) 77 156 (H) 165 (H) 98  (H): Data is abnormally high (L): Data is abnormally low  Diabetes history: DM2 Outpatient Diabetes medications: Basaglar 32 hs, Nov 6-9 tid [facility] Current orders for Inpatient glycemic control: Semglee 10 units, Novolog correction 0-15 units tid, 0-5 units hs  Inpatient Diabetes Program Recommendations:   Patient had hypoglycemia post correction + meal coverage. Please consider: -Decrease in Novolog correction to 0-9 units tid, 0-5 units hs  Thank you, Bethena Roys E. Larue Lightner, RN, MSN, CDE  Diabetes Coordinator Inpatient Glycemic Control Team Team Pager [phone] (8am-5pm) 08/04/2021 8:12 AM

## 2021-08-04 NOTE — Progress Notes (Signed)
Arrived to [facility] by [facility] from [facility]. C/O head ache. 0.9NS infusing at 74cc/hr to rt ac w/ Heparin drip infusing at 1000 U/hr on arrival. Skin warm and dry.

## 2021-08-04 NOTE — Progress Notes (Signed)
Subjective:  Denies SSCP, palpitations or Dyspnea Ready for cath Eliquis held Cr improved  May be transferred directly to lab   Objective:  Vitals:   08/03/21 0502 08/03/21 1448 08/03/21 2135 08/04/21 0449  BP: (!) 138/58 (!) 136/58 (!) 143/51 (!) 164/86  Pulse: (!) 55 60 (!) 57 (!) 54  Resp: 18  18 18   Temp: 98 F (36.7 C) 99.2 F (37.3 C) 98 F (36.7 C) 98.2 F (36.8 C)  TempSrc:  Oral    SpO2: 98% 98% 99% 98%  Weight:      Height:        Intake/Output from previous day:  Intake/Output Summary (Last 24 hours) at 08/04/2021 4680 Last data filed at 08/04/2021 0600 Gross per 24 hour  Intake 853.78 ml  Output 1100 ml  Net -246.22 ml    Physical Exam: Affect appropriate Healthy:  appears stated age HEENT: normal Neck supple with no adenopathy JVP normal no bruits no thyromegaly Lungs clear with no wheezing and good diaphragmatic motion Heart:  S1/S2 no murmur, no rub, gallop or click PMI normal Abdomen: benighn, BS positve, no tenderness, no AAA no bruit.  No HSM or HJR Distal pulses intact with no bruits No edema Neuro non-focal Skin warm and dry No muscular weakness   Lab Results: Basic Metabolic Panel: Recent Labs    08/03/21 0005 08/04/21 0638  NA 140 140  K 4.5 4.3  CL 108 112*  CO2 25 26  GLUCOSE 304* 109*  BUN 39* 29*  CREATININE 1.57* 1.33*  CALCIUM 9.4 9.0   Liver Function Tests: Recent Labs    08/02/21 1320  AST 24  ALT 18  ALKPHOS 52  BILITOT 0.5  PROT 6.0*  ALBUMIN 3.3*   No results for input(s): LIPASE, AMYLASE in the last 72 hours. CBC: Recent Labs    08/03/21 0005 08/04/21 0435  WBC 5.8 5.0  HGB 11.0* 9.8*  HCT 31.2* 29.0*  MCV 80.2 80.3  PLT 167 146*     Imaging: DG Chest 2 View  Result Date: 08/02/2021 CLINICAL DATA:  chest pain EXAM: CHEST - 2 VIEW COMPARISON:  06/11/2021. FINDINGS: Slight mild elevation the right hemidiaphragm, unchanged. No consolidation. No visible pleural effusions or pneumothorax.  Similar cardiomediastinal silhouette. IMPRESSION: No evidence of acute cardiopulmonary disease. Electronically Signed   By: Margaretha Sheffield M.D.   On: 08/02/2021 13:47    Cardiac Studies:  ECG: SR PAC old IMI inferior lateral T wave changes    Telemetry:  NSR 08/04/2021   Echo:   Medications:    aspirin EC  81 mg Oral Daily   atorvastatin  80 mg Oral QPM   insulin aspart  0-15 Units Subcutaneous TID WC   insulin aspart  0-5 Units Subcutaneous QHS   insulin glargine-yfgn  10 Units Subcutaneous QHS      sodium chloride 3 mL/kg/hr (08/04/21 0615)   Followed by   sodium chloride 1 mL/kg/hr (08/04/21 0730)   heparin 1,000 Units/hr (08/04/21 0730)    Assessment/Plan:   76 y.o. admitted with chest pain and elevated troponin History of CAD with CTO RCA by cath 2015 has not been cathed since SSCP anginal sounding pain free now troponin peak 1022 now coming down ECG SB LAD no acute ST changes On eliquis last dose last night PAF with SSS beta blockers held EF has been preserved 55% by TTE 06/13/21 CRF with Cr stable  1.33   SEMI:  Troponin coming down 1022-> 946 pain free no  acute ST elevation for cath today right radial. On heparin had no eliquis yesterday ? New dx in left system or insufficient left to right collaterals  PAF:  NSR on heparin   Roney Jaffe aware on cath schedule for Dr End NPO  Jenkins Rouge 08/04/2021, 8:08 AM

## 2021-08-04 NOTE — Progress Notes (Signed)
PROGRESS NOTE Cassandra Adams  JME:[id number] DOB: 1946-05-13 DOA: 08/02/2021 PCP: Sharilyn Sites, MD   Brief Narrative/Hospital Course: Cassandra Adams, 76 y.o. female 76 y.o. f w/ chronic diastolic CHF, atrial fibrillation, coronary artery disease-recent hospitalization 12/17 - 12/21 for hypoglycemia and bradycardia, CKD 3, diabetes mellitus hypertension, p/w  left sided chest pain that started about 12  on 2/7on walking from her bathroom to her bedroom , could not catch her breath, but that has since resolved on arrival to ED. Her blood pressure was 94/56 when she checked in the morning.  Her weight is unchanged. In the ED, hypotensive 84/48 improved with 1 L normal saline, troponin slightly positive  36> 127  chest x-ray clear EKG with T wave inversions, patient was admitted. Patient is admitted overnight seen by cardiology on consult next morning. History of CAD with CTO RCA by cath in 2015 has not been cath since then. Her troponin bump and her chest pain in the setting of previous abnormal CAD consistent with NSTEMI cardiology switched her Eliquis to heparin drip and advised transfer to Cassandra Adams for cardiac cath on 08/04/21     Subjective: Seen Cassandra Adams.  Complains some headache and reports it happens whenever her blood sugar is low.  Hypoglycemia overnight and 49 improved with treatment.  Remains n.p.o. for cardiac cath this afternoon, [facility] on way to transport This morning denies numbness tingling in her extremities, had some episode last night and reports this is not new for her.  Denies focal weakness double vision headache speech issues.  Assessment and Plan: * NSTEMI (non-ST elevated myocardial infarction) ([place])- (present on admission) Patient with known CAD with CTO of RCA in 2015 and moderate LAD disease: Concerning for ACS- NSEMI, no acute EKG changes.  Appreciate cardiology input, continue heparin drip Eliquis on hold since 2/8 a.m. Continue aspirin 81 Lipitor 83.  No  beta-blocker due to SSS with bradycardia risk.    CAD (coronary artery disease)- (present on admission) See #1-chest pain.  Stage 3b chronic kidney disease (CKD) ([place])- (present on admission) Creatinine improving/stable.baseline around 1.2 -1.4 in December  2022. monitor closely Recent Labs  Lab 08/02/21 1320 08/03/21 0005 08/04/21 0638  BUN 41* 39* 29*  CREATININE 1.71* 1.57* 1.33*    Carotid stenosis, right- (present on admission) S/p right enterectomy history, no stenosis recent carotid Doppler done on 07/28/2021 by Dr. Debara Pickett.  Continue home meds  PAF (paroxysmal atrial fibrillation) ([place])- (present on admission) In NSR. on heparin.  Eliquis on hold   Essential hypertension, benign- (present on admission) BP fairly stable.No beta-blocker due to SSS.  Her meds including hydralazine Entresto lisinopril and Lasix currently on hold  Type 2 diabetes mellitus with diabetic neuropathy ([place])- (present on admission) With hypoglycemia in 49 resolved with treatment.  DC Premeal insulin, keep on SSI wildly cut down Lantus to 10 units, normally on 32 units Tresiba bedtime. Last A1c 8 range stable.  Suspect bilateral neuropathy patient endorses numbness tingling in all 4 extremities intermittently at home Recent Labs  Lab 08/03/21 2135 08/03/21 2211 08/03/21 2300 08/03/21 2356 08/04/21 0722  GLUCAP 49* 77 156* 165* 98    DVT prophylaxis: SCDs Start: 08/02/21 1901 heparin drip Code Status:   Code Status: Full Code Family Communication: plan of care discussed with patient/family members at bedside.  Disposition: Currently not medically stable for discharge. Status is: Inpatient Remains inpatient appropriate because: Management of her ACS Being transferred to [facility] for cardiac cath. Cassandra Adams team signed out to take  over care once patient is transferred to Cassandra Adams Discussed with cardiologist this morning  Objective: Vitals last 24 hrs: Vitals:   08/03/21 0502 08/03/21  1448 08/03/21 2135 08/04/21 0449  BP: (!) 138/58 (!) 136/58 (!) 143/51 (!) 164/86  Pulse: (!) 55 60 (!) 57 (!) 54  Resp: 18  18 18   Temp: 98 F (36.7 C) 99.2 F (37.3 C) 98 F (36.7 C) 98.2 F (36.8 C)  TempSrc:  Oral    SpO2: 98% 98% 99% 98%  Weight:      Height:       Weight change:   Physical Examination: General exam: AA0x3, older than stated age, weak appearing. HEENT:Oral mucosa moist, Ear/Nose WNL grossly, dentition normal. Respiratory system: bilaterally clear, no use of accessory muscle Cardiovascular system: S1 & S2 +, No JVD,. Gastrointestinal system: Abdomen soft,NT,ND,BS+ Nervous System:Alert, awake, moving extremities and grossly nonfocal Extremities: LE ankle none, distal peripheral pulses palpable.  Skin: No rashes,no icterus. MSK: Normal muscle bulk,tone, power   Medications reviewed:  Scheduled Meds:  aspirin EC  81 mg Oral Daily   atorvastatin  80 mg Oral QPM   insulin aspart  0-15 Units Subcutaneous TID WC   insulin aspart  0-5 Units Subcutaneous QHS   insulin glargine-yfgn  10 Units Subcutaneous QHS   Continuous Infusions:  sodium chloride 1 mL/kg/hr (08/04/21 0730)   heparin 1,000 Units/hr (08/04/21 0730)      Diet Order             Diet NPO time specified Except for: Sips with Meds  Diet effective midnight                  Data Reviewed: I have personally reviewed following labs and imaging studies CBC: Recent Labs  Lab 08/02/21 1320 08/03/21 0005 08/04/21 0435  WBC 3.5* 5.8 5.0  HGB 11.5* 11.0* 9.8*  HCT 33.1* 31.2* 29.0*  MCV 80.9 80.2 80.3  PLT 164 167 009*   Basic Metabolic Panel: Recent Labs  Lab 08/02/21 1320 08/03/21 0005 08/04/21 0638  NA 133* 140 140  K 4.0 4.5 4.3  CL 101 108 112*  CO2 20* 25 26  GLUCOSE 362* 304* 109*  BUN 41* 39* 29*  CREATININE 1.71* 1.57* 1.33*  CALCIUM 9.2 9.4 9.0   GFR: Estimated Creatinine Clearance: 37 mL/min (A) (by C-G formula based on SCr of 1.33 mg/dL (H)). Liver Function  Tests: Recent Labs  Lab 08/02/21 1320  AST 24  ALT 18  ALKPHOS 52  BILITOT 0.5  PROT 6.0*  ALBUMIN 3.3*   No results for input(s): LIPASE, AMYLASE in the last 168 hours. No results for input(s): AMMONIA in the last 168 hours. Coagulation Profile: No results for input(s): INR, PROTIME in the last 168 hours. Cardiac Enzymes: No results for input(s): CKTOTAL, CKMB, CKMBINDEX, TROPONINI in the last 168 hours. BNP (last 3 results) No results for input(s): PROBNP in the last 8760 hours. HbA1C: No results for input(s): HGBA1C in the last 72 hours. CBG: Recent Labs  Lab 08/03/21 2135 08/03/21 2211 08/03/21 2300 08/03/21 2356 08/04/21 0722  GLUCAP 49* 77 156* 165* 98   Lipid Profile: No results for input(s): CHOL, HDL, LDLCALC, TRIG, CHOLHDL, LDLDIRECT in the last 72 hours. Thyroid Function Tests: No results for input(s): TSH, T4TOTAL, FREET4, T3FREE, THYROIDAB in the last 72 hours. Anemia Panel: No results for input(s): VITAMINB12, FOLATE, FERRITIN, TIBC, IRON, RETICCTPCT in the last 72 hours. Sepsis Labs: No results for input(s): PROCALCITON, LATICACIDVEN in the last  168 hours.  Recent Results (from the past 240 hour(s))  Resp Panel by RT-PCR (Flu A&B, Covid) Nasopharyngeal Swab     Status: None   Collection Time: 08/02/21  5:30 PM   Specimen: Nasopharyngeal Swab; Nasopharyngeal(NP) swabs in vial transport medium  Result Value Ref Range Status   SARS Coronavirus 2 by RT PCR NEGATIVE NEGATIVE Final    Comment: (NOTE) SARS-CoV-2 target nucleic acids are NOT DETECTED.  The SARS-CoV-2 RNA is generally detectable in upper respiratory specimens during the acute phase of infection. The lowest concentration of SARS-CoV-2 viral copies this assay can detect is 138 copies/mL. A negative result does not preclude SARS-Cov-2 infection and should not be used as the sole basis for treatment or other patient management decisions. A negative result may occur with  improper specimen  collection/handling, submission of specimen other than nasopharyngeal swab, presence of viral mutation(s) within the areas targeted by this assay, and inadequate number of viral copies(<138 copies/mL). A negative result must be combined with clinical observations, patient history, and epidemiological information. The expected result is Negative.  Fact Sheet for Patients:  EntrepreneurPulse.com.au  Fact Sheet for Healthcare Providers:  IncredibleEmployment.be  This test is no t yet approved or cleared by the [place] FDA and  has been authorized for detection and/or diagnosis of SARS-CoV-2 by FDA under an Emergency Use Authorization (EUA). This EUA will remain  in effect (meaning this test can be used) for the duration of the COVID-19 declaration under Section 564(b)(1) of the Act, 21 U.S.C.section 360bbb-3(b)(1), unless the authorization is terminated  or revoked sooner.       Influenza A by PCR NEGATIVE NEGATIVE Final   Influenza B by PCR NEGATIVE NEGATIVE Final    Comment: (NOTE) The Xpert Xpress SARS-CoV-2/FLU/RSV plus assay is intended as an aid in the diagnosis of influenza from Nasopharyngeal swab specimens and should not be used as a sole basis for treatment. Nasal washings and aspirates are unacceptable for Xpert Xpress SARS-CoV-2/FLU/RSV testing.  Fact Sheet for Patients: EntrepreneurPulse.com.au  Fact Sheet for Healthcare Providers: IncredibleEmployment.be  This test is not yet approved or cleared by the [place] FDA and has been authorized for detection and/or diagnosis of SARS-CoV-2 by FDA under an Emergency Use Authorization (EUA). This EUA will remain in effect (meaning this test can be used) for the duration of the COVID-19 declaration under Section 564(b)(1) of the Act, 21 U.S.C. section 360bbb-3(b)(1), unless the authorization is terminated or revoked.  Performed at [facility], [address], [place], [place] [address]     Antimicrobials: Anti-infectives (From admission, onward)    None      Culture/Microbiology    Component Value Date/Time   SDES  06/11/2021 1505    IN/OUT CATH URINE Performed at [facility], [address], [place], [place] [address]    [facility]  06/11/2021 1505    NONE Performed at [facility], [address] [place], [place] [address]    CULT  06/11/2021 1505    NO GROWTH Performed at [facility] [address] [place], [place] [address]    REPTSTATUS 06/13/2021 FINAL 06/11/2021 1505    Other culture-see note  Radiology Studies: DG Chest 2 View  Result Date: 08/02/2021 CLINICAL DATA:  chest pain EXAM: CHEST - 2 VIEW COMPARISON:  06/11/2021. FINDINGS: Slight mild elevation the right hemidiaphragm, unchanged. No consolidation. No visible pleural effusions or pneumothorax. Similar cardiomediastinal silhouette. IMPRESSION: No evidence of acute cardiopulmonary disease. Electronically Signed   By: Roslynn Amble  Ronnald Ramp M.D.   On: 08/02/2021 13:47     LOS: 1 day   Antonieta Pert, MD [facility]  08/04/2021, 8:57 AM

## 2021-08-04 NOTE — Progress Notes (Signed)
[place] for Heparin Indication: chest pain/ACS  Allergies  Allergen Reactions   Penicillins Hives    Has patient had a PCN reaction causing immediate rash, facial/tongue/throat swelling, SOB or lightheadedness with hypotension: no Has patient had a PCN reaction causing severe rash involving mucus membranes or skin necrosis: No  Has patient had a PCN reaction that required hospitalization: no Has patient had a PCN reaction occurring within the last 10 years: no If all of the above answers are "NO", then may proceed with Cephalosporin use.     Patient Measurements: Height: 5\' 6"  (167.6 cm) Weight: 74 kg (163 lb 1.6 oz) IBW/kg (Calculated) : 59.3   Vital Signs: Temp: 97.7 F (36.5 C) (02/09 1324) Temp Source: Oral (02/09 1324) BP: 154/68 (02/09 1344) Pulse Rate: 54 (02/09 1253)  Labs: Recent Labs    08/02/21 1320 08/02/21 1522 08/02/21 2131 08/03/21 0005 08/03/21 0455 08/03/21 1631 08/04/21 0117 08/04/21 0435 08/04/21 0638  HGB 11.5*  --   --  11.0*  --   --   --  9.8*  --   HCT 33.1*  --   --  31.2*  --   --   --  29.0*  --   PLT 164  --   --  167  --   --   --  146*  --   APTT  --   --   --   --   --  64* 137* 157*  --   HEPARINUNFRC  --   --   --   --   --  >1.10*  --  >1.10*  --   CREATININE 1.71*  --   --  1.57*  --   --   --   --  1.33*  TROPONINIHS 36*   < > 1,022* 1,014* 946*  --   --   --   --    < > = values in this interval not displayed.     Estimated Creatinine Clearance: 37 mL/min (A) (by C-G formula based on SCr of 1.33 mg/dL (H)).  Assessment: 76 year old female s/p cath with plans for stent on 2/10. She is on apixaban PTA for afib (last dose 10pm on 2/7). Sheath removed ~ 12:30pm and heparin to restart 2 hours post TR band  Goal of Therapy:  APTT 66-102 Heparin level 0.3-0.7 units/ml Monitor platelets by anticoagulation protocol: Yes   Plan:  -Restart heparin 1000 units/hr 2 hours post TR banc -Will  check heparin level, aPTT and CBC based on timing of heparin restart  Hildred Laser, PharmD Clinical Pharmacist **Pharmacist phone directory can now be found on [email or web address] ([email or web address]).  Listed under [place].

## 2021-08-05 ENCOUNTER — Encounter (HOSPITAL_COMMUNITY)
Admission: EM | Disposition: A | Discharge status: Home / Self Care | Payer: Self-pay | Source: Home / Self Care | Attending: Internal Medicine

## 2021-08-05 ENCOUNTER — Encounter (HOSPITAL_COMMUNITY): Payer: Self-pay | Admitting: Internal Medicine

## 2021-08-05 DIAGNOSIS — I10 Essential (primary) hypertension: Secondary | ICD-10-CM

## 2021-08-05 DIAGNOSIS — I2510 Atherosclerotic heart disease of native coronary artery without angina pectoris: Secondary | ICD-10-CM | POA: Diagnosis not present

## 2021-08-05 DIAGNOSIS — N1831 Chronic kidney disease, stage 3a: Secondary | ICD-10-CM

## 2021-08-05 DIAGNOSIS — I480 Paroxysmal atrial fibrillation: Secondary | ICD-10-CM | POA: Diagnosis not present

## 2021-08-05 DIAGNOSIS — I214 Non-ST elevation (NSTEMI) myocardial infarction: Secondary | ICD-10-CM | POA: Diagnosis not present

## 2021-08-05 HISTORY — PX: CORONARY STENT INTERVENTION: CATH118234

## 2021-08-05 HISTORY — PX: CORONARY ATHERECTOMY: CATH118238

## 2021-08-05 LAB — CBC
HCT: 30.2 % — ABNORMAL LOW (ref 36.0–46.0)
Hemoglobin: 10.8 g/dL — ABNORMAL LOW (ref 12.0–15.0)
MCH: 28.1 pg (ref 26.0–34.0)
MCHC: 35.8 g/dL (ref 30.0–36.0)
MCV: 78.4 fL — ABNORMAL LOW (ref 80.0–100.0)
Platelets: 155 10*3/uL (ref 150–400)
RBC: 3.85 MIL/uL — ABNORMAL LOW (ref 3.87–5.11)
RDW: 14.6 % (ref 11.5–15.5)
WBC: 4 10*3/uL (ref 4.0–10.5)
nRBC: 0 % (ref 0.0–0.2)

## 2021-08-05 LAB — BASIC METABOLIC PANEL
Anion gap: 7 (ref 5–15)
BUN: 26 mg/dL — ABNORMAL HIGH (ref 8–23)
CO2: 23 mmol/L (ref 22–32)
Calcium: 9.3 mg/dL (ref 8.9–10.3)
Chloride: 108 mmol/L (ref 98–111)
Creatinine, Ser: 1.38 mg/dL — ABNORMAL HIGH (ref 0.44–1.00)
GFR, Estimated: 40 mL/min — ABNORMAL LOW (ref >60)
Glucose, Bld: 166 mg/dL — ABNORMAL HIGH (ref 70–99)
Potassium: 4.5 mmol/L (ref 3.5–5.1)
Sodium: 138 mmol/L (ref 135–145)

## 2021-08-05 LAB — GLUCOSE, CAPILLARY
Glucose-Capillary: 160 mg/dL — ABNORMAL HIGH (ref 70–99)
Glucose-Capillary: 149 mg/dL — ABNORMAL HIGH (ref 70–99)
Glucose-Capillary: 100 mg/dL — ABNORMAL HIGH (ref 70–99)
Glucose-Capillary: 136 mg/dL — ABNORMAL HIGH (ref 70–99)
Glucose-Capillary: 97 mg/dL (ref 70–99)

## 2021-08-05 LAB — POCT ACTIVATED CLOTTING TIME
Activated Clotting Time: 293 seconds
Activated Clotting Time: 251 seconds

## 2021-08-05 LAB — HEPARIN LEVEL (UNFRACTIONATED): Heparin Unfractionated: >1.1 IU/mL — ABNORMAL HIGH (ref 0.30–0.70)

## 2021-08-05 LAB — APTT: aPTT: 100 seconds — ABNORMAL HIGH (ref 24–36)

## 2021-08-05 SURGERY — CORONARY STENT INTERVENTION
Anesthesia: LOCAL

## 2021-08-05 MED ORDER — HEPARIN SODIUM (PORCINE) 1000 UNIT/ML IJ SOLN
INTRAMUSCULAR | Status: AC
  Filled 2021-08-05: qty 10

## 2021-08-05 MED ORDER — HYDRALAZINE HCL 20 MG/ML IJ SOLN: 10.0000 mg | INTRAMUSCULAR | Status: AC | PRN

## 2021-08-05 MED ORDER — VERAPAMIL HCL 2.5 MG/ML IV SOLN
INTRAVENOUS | Status: DC | PRN
  Administered 2021-08-05: 10 mL via INTRA_ARTERIAL

## 2021-08-05 MED ORDER — NITROGLYCERIN 1 MG/10 ML FOR IR/CATH LAB
INTRA_ARTERIAL | Status: AC
  Filled 2021-08-05: qty 10

## 2021-08-05 MED ORDER — FENTANYL CITRATE (PF) 100 MCG/2ML IJ SOLN
INTRAMUSCULAR | Status: AC
  Filled 2021-08-05: qty 2

## 2021-08-05 MED ORDER — LIDOCAINE HCL (PF) 1 % IJ SOLN
INTRAMUSCULAR | Status: DC | PRN
  Administered 2021-08-05: 2 mL via INTRADERMAL

## 2021-08-05 MED ORDER — SODIUM CHLORIDE 0.9% FLUSH
3.0000 mL | Freq: Two times a day (BID) | INTRAVENOUS | Status: DC
  Administered 2021-08-06 – 2021-08-07 (×3): 3 mL via INTRAVENOUS

## 2021-08-05 MED ORDER — SODIUM CHLORIDE 0.9 % IV SOLN
5.0000 mg/kg | Freq: Once | INTRAVENOUS | Status: DC
  Filled 2021-08-05: qty 15.38

## 2021-08-05 MED ORDER — HEPARIN (PORCINE) IN NACL 1000-0.9 UT/500ML-% IV SOLN
INTRAVENOUS | Status: AC
  Filled 2021-08-05: qty 1000

## 2021-08-05 MED ORDER — FENTANYL CITRATE (PF) 100 MCG/2ML IJ SOLN
INTRAMUSCULAR | Status: DC | PRN
  Administered 2021-08-05: 25 ug via INTRAVENOUS

## 2021-08-05 MED ORDER — SODIUM CHLORIDE 0.9 % IV SOLN
INTRAVENOUS | Status: DC | PRN
  Administered 2021-08-05: 384.5 mg via INTRAVENOUS

## 2021-08-05 MED ORDER — MIDAZOLAM HCL 2 MG/2ML IJ SOLN
INTRAMUSCULAR | Status: DC | PRN
  Administered 2021-08-05: 2 mg via INTRAVENOUS

## 2021-08-05 MED ORDER — HEPARIN (PORCINE) IN NACL 1000-0.9 UT/500ML-% IV SOLN
INTRAVENOUS | Status: DC | PRN
  Administered 2021-08-05 (×2): 500 mL

## 2021-08-05 MED ORDER — NITROGLYCERIN 1 MG/10 ML FOR IR/CATH LAB
INTRA_ARTERIAL | Status: DC | PRN
  Administered 2021-08-05 (×2): 150 ug via INTRACORONARY

## 2021-08-05 MED ORDER — VERAPAMIL HCL 2.5 MG/ML IV SOLN
INTRAVENOUS | Status: AC
  Filled 2021-08-05: qty 2

## 2021-08-05 MED ORDER — SODIUM CHLORIDE 0.9% FLUSH: 3.0000 mL | INTRAVENOUS | Status: DC | PRN

## 2021-08-05 MED ORDER — LABETALOL HCL 5 MG/ML IV SOLN: 10.0000 mg | INTRAVENOUS | Status: AC | PRN

## 2021-08-05 MED ORDER — IOHEXOL 350 MG/ML SOLN
INTRAVENOUS | Status: DC | PRN
  Administered 2021-08-05: 85 mL via INTRA_ARTERIAL

## 2021-08-05 MED ORDER — APIXABAN 5 MG PO TABS
5.0000 mg | ORAL_TABLET | Freq: Two times a day (BID) | ORAL | Status: DC
  Administered 2021-08-06 – 2021-08-07 (×3): 5 mg via ORAL
  Filled 2021-08-05 (×3): qty 1

## 2021-08-05 MED ORDER — HEPARIN SODIUM (PORCINE) 1000 UNIT/ML IJ SOLN
INTRAMUSCULAR | Status: DC | PRN
  Administered 2021-08-05: 8000 [IU] via INTRAVENOUS

## 2021-08-05 MED ORDER — MIDAZOLAM HCL 2 MG/2ML IJ SOLN
INTRAMUSCULAR | Status: AC
  Filled 2021-08-05: qty 2

## 2021-08-05 MED ORDER — HYDROMORPHONE HCL 1 MG/ML IJ SOLN
0.5000 mg | Freq: Once | INTRAMUSCULAR | Status: AC | PRN
  Administered 2021-08-05: 0.5 mg via INTRAVENOUS
  Filled 2021-08-05: qty 1

## 2021-08-05 MED ORDER — SODIUM CHLORIDE 0.9 % IV SOLN: 250.0000 mL | INTRAVENOUS | Status: DC | PRN

## 2021-08-05 MED ORDER — LIDOCAINE HCL (PF) 1 % IJ SOLN
INTRAMUSCULAR | Status: AC
  Filled 2021-08-05: qty 30

## 2021-08-05 MED ORDER — SODIUM CHLORIDE 0.9 % WEIGHT BASED INFUSION
1.0000 mL/kg/h | INTRAVENOUS | Status: AC
  Administered 2021-08-05: 1 mL/kg/h via INTRAVENOUS

## 2021-08-05 SURGICAL SUPPLY — 21 items
BALL SAPPHIRE NC24 3.5X26 (BALLOONS) ×2
BALLN SAPPHIRE 3.0X20 (BALLOONS) ×2
BALLOON SAPPHIRE 3.0X20 (BALLOONS) IMPLANT
BALLOON SAPPHIRE NC24 3.5X26 (BALLOONS) IMPLANT
CATH LAUNCHER 6FR EBU3.5 (CATHETERS) ×1 IMPLANT
CROWN DIAMONDBACK CLASSIC 1.25 (BURR) ×1 IMPLANT
DEVICE RAD COMP TR BAND LRG (VASCULAR PRODUCTS) ×1 IMPLANT
ELECT DEFIB PAD ADLT CADENCE (PAD) ×1 IMPLANT
GLIDESHEATH SLEND SS 6F .021 (SHEATH) ×1 IMPLANT
GUIDEWIRE INQWIRE 1.5J.035X260 (WIRE) IMPLANT
INQWIRE 1.5J .035X260CM (WIRE) ×2
KIT ENCORE 26 ADVANTAGE (KITS) ×1 IMPLANT
KIT HEART LEFT (KITS) ×3 IMPLANT
LUBRICANT VIPERSLIDE CORONARY (MISCELLANEOUS) ×1 IMPLANT
PACK CARDIAC CATHETERIZATION (CUSTOM PROCEDURE TRAY) ×3 IMPLANT
STENT ONYX FRONTIER 3.0X30 (Permanent Stent) ×1 IMPLANT
TRANSDUCER W/STOPCOCK (MISCELLANEOUS) ×3 IMPLANT
TUBING CIL FLEX 10 FLL-RA (TUBING) ×3 IMPLANT
VALVE COPILOT STAT (MISCELLANEOUS) ×1 IMPLANT
WIRE COUGAR XT STRL 190CM (WIRE) ×1 IMPLANT
WIRE VIPERWIRE COR FLEX .012 (WIRE) ×1 IMPLANT

## 2021-08-05 NOTE — Progress Notes (Signed)
Progress Note  Patient Name: Cassandra Adams Date of Encounter: 08/05/2021  [facility] Cardiologist: [name], MD   Subjective   No CP or dyspnea; complains of hip pain  Inpatient Medications    Scheduled Meds:  amLODipine  5 mg Oral Daily   [START ON 08/06/2021] aspirin EC  81 mg Oral Daily   atorvastatin  80 mg Oral QPM   clopidogrel  75 mg Oral Q breakfast   insulin aspart  0-15 Units Subcutaneous TID WC   insulin aspart  0-5 Units Subcutaneous QHS   insulin glargine-yfgn  10 Units Subcutaneous QHS   pantoprazole  40 mg Oral Daily   sodium chloride flush  3 mL Intravenous Q12H   sodium chloride flush  3 mL Intravenous Q12H   sodium chloride flush  3 mL Intravenous Q12H   Continuous Infusions:  sodium chloride     sodium chloride     sodium chloride     sodium chloride 75 mL/hr at 08/05/21 0030   heparin 1,000 Units/hr (08/04/21 2025)   PRN Meds: sodium chloride, sodium chloride, sodium chloride, acetaminophen **OR** acetaminophen, ondansetron **OR** ondansetron (ZOFRAN) IV, polyethylene glycol, sodium chloride flush, sodium chloride flush   Vital Signs    Vitals:   08/04/21 2116 08/05/21 0105 08/05/21 0356 08/05/21 0610  BP: (!) 169/64 (!) 168/69 (!) 137/56   Pulse: 70 74 (!) 57   Resp: 17 18    Temp: 98.8 F (37.1 C) 99 F (37.2 C) 99 F (37.2 C)   TempSrc: Oral Oral    SpO2: 98% 94% 100%   Weight:    76.9 kg  Height:        Intake/Output Summary (Last 24 hours) at 08/05/2021 0739 Last data filed at 08/05/2021 0537 Gross per 24 hour  Intake 417 ml  Output 650 ml  Net -233 ml   Last 3 Weights 08/05/2021 08/02/2021 08/02/2021  Weight (lbs) 169 lb 9.6 oz 163 lb 1.6 oz 175 lb 14.8 oz  Weight (kg) 76.93 kg 73.982 kg 79.8 kg      Telemetry    Sinus - Personally Reviewed    Physical Exam   GEN: No acute distress.   Neck: No JVD Cardiac: RRR, no murmurs, rubs, or gallops.  Respiratory: Clear to auscultation bilaterally. GI: Soft,  nontender, non-distended  MS: No edema Neuro:  Nonfocal  Psych: Normal affect   Labs    High Sensitivity Troponin:   Recent Labs  Lab 08/02/21 1320 08/02/21 1522 08/02/21 2131 08/03/21 0005 08/03/21 0455  TROPONINIHS 36* 127* 1,022* 1,014* 946*     Chemistry Recent Labs  Lab 08/02/21 1320 08/03/21 0005 08/04/21 0638 08/05/21 0305  NA 133* 140 140 138  K 4.0 4.5 4.3 4.5  CL 101 108 112* 108  CO2 20* 25 26 23   GLUCOSE 362* 304* 109* 166*  BUN 41* 39* 29* 26*  CREATININE 1.71* 1.57* 1.33* 1.38*  CALCIUM 9.2 9.4 9.0 9.3  PROT 6.0*  --   --   --   ALBUMIN 3.3*  --   --   --   AST 24  --   --   --   ALT 18  --   --   --   ALKPHOS 52  --   --   --   BILITOT 0.5  --   --   --   GFRNONAA 31* 34* 41* 40*  ANIONGAP 12 7 2* 7     Hematology Recent Labs  Lab  08/03/21 0005 08/04/21 0435 08/05/21 0305  WBC 5.8 5.0 4.0  RBC 3.89 3.61* 3.85*  HGB 11.0* 9.8* 10.8*  HCT 31.2* 29.0* 30.2*  MCV 80.2 80.3 78.4*  MCH 28.3 27.1 28.1  MCHC 35.3 33.8 35.8  RDW 14.3 14.6 14.6  PLT 167 146* 155   BNP Recent Labs  Lab 08/02/21 1320  BNP 95.0     Radiology    CARDIAC CATHETERIZATION  Result Date: 08/04/2021 Conclusions: Severe two-vessel coronary artery disease, including 70% proximal/mid LAD stenosis with heavy calcification that is significant by RFR (0.71), and chronic total occlusion of mid RCA. Normal left ventricular filling pressure. Calcified aorta consistent with "porcelain aorta." Recommendations: Staged PCI/atherectomy of proximal/mid LAD if renal function remains stable. Restart IV heparin 2 hours after TR band removal.  Will load with clopidogrel today in anticipation of PCI tomorrow. Obtain echocardiogram to ensure LVEF is stable. Aggressive secondary prevention of coronary artery disease. Nelva Bush, MD [facility]  ECHOCARDIOGRAM COMPLETE  Result Date: 08/04/2021    ECHOCARDIOGRAM REPORT   Patient Name:   Cassandra Adams [facility] Date of Exam: 08/04/2021  Medical Rec #:  [id number]            Height:       66.0 in Accession #:    [id number]           Weight:       163.1 lb Date of Birth:  1945-10-19            BSA:          1.834 m Patient Age:    76 years             BP:           157/53 mmHg Patient Gender: F                    HR:           57 bpm. Exam Location:  Inpatient Procedure: 2D Echo Indications:    Acute myocardial infarction  History:        Patient has prior history of Echocardiogram examinations, most                 recent 06/13/2021. CAD, Arrythmias:Atrial Fibrillation; Risk                 Factors:Diabetes and Hypertension.  Sonographer:    Jefferey Pica Referring Phys: [place]  1. Left ventricular ejection fraction, by estimation, is 60 to 65%. The left ventricle has normal function. The left ventricle demonstrates regional wall motion abnormalities (see scoring diagram/findings for description). There is severe asymmetric left ventricular hypertrophy. Left ventricular diastolic parameters are consistent with Grade III diastolic dysfunction (restrictive). Elevated left ventricular end-diastolic pressure. There is mild hypokinesis of the left ventricular, basal inferior wall.  2. Right ventricular systolic function is normal. The right ventricular size is normal. There is normal pulmonary artery systolic pressure.  3. Left atrial size was severely dilated.  4. The mitral valve is normal in structure. Trivial mitral valve regurgitation. No evidence of mitral stenosis.  5. The aortic valve is tricuspid. There is mild calcification of the aortic valve. There is mild thickening of the aortic valve. Aortic valve regurgitation is not visualized. Aortic valve sclerosis/calcification is present, without any evidence of aortic stenosis.  6. The inferior vena cava is normal in size with <50% respiratory variability, suggesting right atrial pressure of 8 mmHg. Comparison(s): No significant change from  prior study. FINDINGS  Left  Ventricle: Left ventricular ejection fraction, by estimation, is 60 to 65%. The left ventricle has normal function. The left ventricle demonstrates regional wall motion abnormalities. Mild hypokinesis of the left ventricular, basal inferior wall. The left ventricular internal cavity size was normal in size. There is severe asymmetric left ventricular hypertrophy. Left ventricular diastolic parameters are consistent with Grade III diastolic dysfunction (restrictive). Elevated left ventricular end-diastolic pressure. Right Ventricle: The right ventricular size is normal. Right vetricular wall thickness was not well visualized. Right ventricular systolic function is normal. There is normal pulmonary artery systolic pressure. The tricuspid regurgitant velocity is 2.59 m/s, and with an assumed right atrial pressure of 8 mmHg, the estimated right ventricular systolic pressure is 84.6 mmHg. Left Atrium: Left atrial size was severely dilated. Right Atrium: Right atrial size was normal in size. Pericardium: Trivial pericardial effusion is present. Mitral Valve: The mitral valve is normal in structure. Trivial mitral valve regurgitation. No evidence of mitral valve stenosis. Tricuspid Valve: The tricuspid valve is normal in structure. Tricuspid valve regurgitation is trivial. No evidence of tricuspid stenosis. Aortic Valve: The aortic valve is tricuspid. There is mild calcification of the aortic valve. There is mild thickening of the aortic valve. Aortic valve regurgitation is not visualized. Aortic valve sclerosis/calcification is present, without any evidence of aortic stenosis. Aortic valve peak gradient measures 12.8 mmHg. Pulmonic Valve: The pulmonic valve was grossly normal. Pulmonic valve regurgitation is trivial. No evidence of pulmonic stenosis. Aorta: The aortic root, ascending aorta, aortic arch and descending aorta are all structurally normal, with no evidence of dilitation or obstruction. Venous: The inferior  vena cava is normal in size with less than 50% respiratory variability, suggesting right atrial pressure of 8 mmHg. IAS/Shunts: The atrial septum is grossly normal.  LEFT VENTRICLE PLAX 2D LVIDd:         4.10 cm   Diastology LVIDs:         2.50 cm   LV e' medial:    4.84 cm/s LV PW:         1.30 cm   LV E/e' medial:  20.0 LV IVS:        1.20 cm   LV e' lateral:   8.45 cm/s LVOT diam:     2.00 cm   LV E/e' lateral: 11.5 LV SV:         92 LV SV Index:   50 LVOT Area:     3.14 cm  RIGHT VENTRICLE             IVC RV S prime:     15.30 cm/s  IVC diam: 1.20 cm TAPSE (M-mode): 2.5 cm LEFT ATRIUM             Index        RIGHT ATRIUM           Index LA diam:        4.40 cm 2.40 cm/m   RA Area:     11.60 cm LA Vol (A2C):   98.9 ml 53.93 ml/m  RA Volume:   23.90 ml  13.03 ml/m LA Vol (A4C):   77.3 ml 42.15 ml/m LA Biplane Vol: 88.8 ml 48.42 ml/m  AORTIC VALVE                 PULMONIC VALVE AV Area (Vmax): 2.42 cm     PV Vmax:       1.14 m/s AV Vmax:  179.00 cm/s  PV Peak grad:  5.2 mmHg AV Peak Grad:   12.8 mmHg LVOT Vmax:      138.00 cm/s LVOT Vmean:     81.400 cm/s LVOT VTI:       0.294 m  AORTA Ao Root diam: 3.30 cm Ao Asc diam:  3.50 cm MITRAL VALVE               TRICUSPID VALVE MV Area (PHT): 3.11 cm    TR Peak grad:   26.8 mmHg MV Decel Time: 244 msec    TR Vmax:        259.00 cm/s MV E velocity: 96.80 cm/s MV A velocity: 44.60 cm/s  SHUNTS MV E/A ratio:  2.17        Systemic VTI:  0.29 m                            Systemic Diam: 2.00 cm Buford Dresser MD Electronically signed by Buford Dresser MD Signature Date/Time: 08/04/2021/8:19:29 PM    Final       Patient Profile     76 y.o. female with past medical history of coronary artery disease, paroxysmal atrial fibrillation, chronic stage III kidney disease, hypertension, sickle cell trait, previous TIA, diabetes mellitus admitted with non-ST elevation myocardial infarction.  Echocardiogram shows normal LV function, grade 3 diastolic  dysfunction, severe left atrial enlargement.  Cardiac catheterization reveals 70% proximal to mid LAD lesion significant by flow wire and chronic total occlusion of mid right coronary artery.  Plan is for PCI today.  Assessment & Plan    1 non-ST elevation myocardial infarction-plan to continue aspirin, Plavix, heparin and statin.  No beta-blocker as patient has a history of bradycardia.  Plan is for PCI of LAD today.  2 chronic stage IIIa kidney disease-creatinine unchanged today.  Follow closely after PCI.  Patient has been hydrated prior to PCI.  3 hypertension-blood pressure elevated.  Amlodipine added.  Advance as needed.  4 paroxysmal atrial fibrillation-patient remains in sinus rhythm.  We will need to resume apixaban once all procedures complete.  5 microcytic anemia-this appears to be chronic in reviewing previous laboratories.  For questions or updates, please contact [place] Please consult [email or web address] for contact info under        Signed, Kirk Ruths, MD  08/05/2021, 7:39 AM

## 2021-08-05 NOTE — Progress Notes (Signed)
TR BAND REMOVAL  LOCATION:    right radial  DEFLATED PER PROTOCOL:    Yes.    TIME BAND OFF / DRESSING APPLIED:    2125   SITE UPON ARRIVAL:    Level 0  SITE AFTER BAND REMOVAL:    Level 0  CIRCULATION SENSATION AND MOVEMENT:    Within Normal Limits   Yes.    COMMENTS:   sterile dressing applied, good capillary refill.

## 2021-08-05 NOTE — Care Management Important Message (Signed)
Important Message  Patient Details  Name: Cassandra Adams MRN: [id number] Date of Birth: 01-13-1946   Medicare Important Message Given:  Yes     Shelda Altes 08/05/2021, 10:51 AM

## 2021-08-05 NOTE — Progress Notes (Signed)
[place] for Heparin Indication: chest pain/ACS  Allergies  Allergen Reactions   Penicillins Hives    Has patient had a PCN reaction causing immediate rash, facial/tongue/throat swelling, SOB or lightheadedness with hypotension: no Has patient had a PCN reaction causing severe rash involving mucus membranes or skin necrosis: No  Has patient had a PCN reaction that required hospitalization: no Has patient had a PCN reaction occurring within the last 10 years: no If all of the above answers are "NO", then may proceed with Cephalosporin use.     Patient Measurements: Height: 5\' 6"  (167.6 cm) Weight: 76.9 kg (169 lb 9.6 oz) IBW/kg (Calculated) : 59.3   Vital Signs: Temp: 99 F (37.2 C) (02/10 0356) Temp Source: Oral (02/10 0105) BP: 137/56 (02/10 0356) Pulse Rate: 57 (02/10 0356)  Labs: Recent Labs    08/02/21 2131 08/03/21 0005 08/03/21 0455 08/03/21 1631 08/03/21 1631 08/04/21 0117 08/04/21 0435 08/04/21 0638 08/05/21 0305  HGB  --  11.0*  --   --   --   --  9.8*  --  10.8*  HCT  --  31.2*  --   --   --   --  29.0*  --  30.2*  PLT  --  167  --   --   --   --  146*  --  155  APTT  --   --   --  64*   < > 137* 157*  --  100*  HEPARINUNFRC  --   --   --  >1.10*  --   --  >1.10*  --  >1.10*  CREATININE  --  1.57*  --   --   --   --   --  1.33* 1.38*  TROPONINIHS 1,022* 1,014* 946*  --   --   --   --   --   --    < > = values in this interval not displayed.     Estimated Creatinine Clearance: 36.3 mL/min (A) (by C-G formula based on SCr of 1.38 mg/dL (H)).  Assessment: 76 year old female s/p cath with plans for stent on 2/10. She is on apixaban PTA for afib (last dose 10pm on 2/7).  -aPTT at goal on 1000 units/hr  Goal of Therapy:  APTT 66-102 Heparin level 0.3-0.7 units/ml Monitor platelets by anticoagulation protocol: Yes   Plan:  -Continue heparin 1000 units/hr  -Will follow plans post cath  Hildred Laser,  PharmD Clinical Pharmacist **Pharmacist phone directory can now be found on [email or web address] ([email or web address]).  Listed under [place].

## 2021-08-05 NOTE — Progress Notes (Signed)
Pt complains of R hip pain with pain scale 10/10. Pt stated " It's a pulling pain". Tylenol PO given as prn order with no help. Dr. Myna Hidalgo ( MD on call ) was notified and ordered one time dose of Dilaudid 0.5 mg  IV. Will continue to monitor pt.

## 2021-08-05 NOTE — Care Management (Signed)
°  Transition of Care [facility]) Screening Note   Patient Details  Name: Cassandra Adams Date of Birth: Mar 31, 1946   Transition of Care [facility]) CM/SW Contact:    Bethena Roys, RN Phone Number: 08/05/2021, 10:00 AM    Transition of Care Department [facility]) has reviewed the patient and no TOC needs have been identified at this time. We will continue to monitor patient advancement through interdisciplinary progression rounds. If new patient transition needs arise, please place a TOC consult.

## 2021-08-05 NOTE — Progress Notes (Signed)
PROGRESS NOTE  Cassandra Adams  DOB: 07-Jan-1946  PCP: Sharilyn Sites, MD HFS:[id number]  DOA: 08/02/2021  LOS: 2 days  Hospital Day: 4  Brief narrative: Cassandra Adams is a 76 y.o. female with PMH significant for DM 2, HTN, paroxysmal A-fib, CAD/stents, chronic diastolic CHF, CKD 3, TIA, sickle cell trait  Patient presented to the ED at [facility] on 2/7 for left-sided chest pain, shortness of breath.  In the ED, her blood pressure was low at 84/48, improved with 1 L normal saline. Troponin increased from 36 to 127, EKG with T wave inversions Chest x-ray unremarkable Admitted to hospitalist service Cardiology consult was obtained Patient's anginal symptoms, EKG changes and troponin elevation were consistent with NSTEMI.  Patient was started on heparin drip and transferred to [facility] for cardiac cath. 2/9, cardiac cath showed CTO of RCA with collaterals and 70% proximal/mid LAD. 2/10, plan for PCI to LAD with atherectomy  Subjective: Patient was seen and examined this am. Lying down in bed. Not in distress. Was waiting for PCI today.  Assessment/Plan: Principal Problem:   NSTEMI (non-ST elevated myocardial infarction) ([place]) Active Problems:   Type 2 diabetes mellitus with diabetic neuropathy (HCC)   Essential hypertension, benign   PAF (paroxysmal atrial fibrillation) (HCC)   Carotid stenosis, right   Stage 3b chronic kidney disease (CKD) (HCC)   CAD (coronary artery disease)    NSTEMI (non-ST elevated myocardial infarction) ([place])- (present on admission) - 2/9, cardiac cath showed CTO of RCA with collaterals and 70% proximal/mid LAD. 2/10, plan for PCI to LAD with atherectomy   CAD (coronary artery disease)- (present on admission) See #1-chest pain.   Stage 3b chronic kidney disease (CKD) ([place])- (present on admission) Creatinine improving/stable.baseline around 1.2 -1.4 in December  2022. monitor closely - repeat BMP tomorrow.      Recent Labs  Lab 08/02/21 1320  08/03/21 0005 08/04/21 0638  BUN 41* 39* 29*  CREATININE 1.71* 1.57* 1.33*      Carotid stenosis, right- (present on admission) S/p right enterectomy history, no stenosis recent carotid Doppler done on 07/28/2021 by Dr. Debara Pickett.  Continue home meds   PAF (paroxysmal atrial fibrillation) ([place])- (present on admission) In NSR. on heparin.  Eliquis on hold    Essential hypertension, benign- (present on admission) BP fairly stable.No beta-blocker due to SSS.  Her meds including hydralazine Entresto lisinopril and Lasix currently on hold   Type 2 diabetes mellitus with diabetic neuropathy ([place])- (present on admission) - currently on Lantus 10 units daily.   Suspect bilateral neuropathy patient endorses numbness tingling in all 4 extremities intermittently at home   Mobility: encourage ambulation Goals of care   Code Status: Full Code    Nutritional status:  Body mass index is 27.37 kg/m.      Diet:  Diet Order             Diet NPO time specified Except for: Sips with Meds  Diet effective midnight                   DVT prophylaxis:  SCDs Start: 08/02/21 1901   Antimicrobials: none Fluid: none Consultants: cardiology Family Communication: none at bedside.  Status is: inpatient  Continue in-hospital care because: cath today Level of care: Telemetry Cardiac   Dispo: The patient is from: home              Anticipated d/c is to: home in 1-2 days  Patient currently is not medically stable to d/c.   Difficult to place patient No     Infusions:   sodium chloride     sodium chloride     sodium chloride     sodium chloride 75 mL/hr at 08/05/21 0030   heparin 1,000 Units/hr (08/04/21 2025)    Scheduled Meds:  amLODipine  5 mg Oral Daily   [START ON 08/06/2021] aspirin EC  81 mg Oral Daily   atorvastatin  80 mg Oral QPM   clopidogrel  75 mg Oral Q breakfast   insulin aspart  0-15 Units Subcutaneous TID WC   insulin aspart  0-5 Units Subcutaneous QHS    insulin glargine-yfgn  10 Units Subcutaneous QHS   pantoprazole  40 mg Oral Daily   sodium chloride flush  3 mL Intravenous Q12H   sodium chloride flush  3 mL Intravenous Q12H   sodium chloride flush  3 mL Intravenous Q12H    PRN meds: sodium chloride, sodium chloride, sodium chloride, acetaminophen **OR** acetaminophen, ondansetron **OR** ondansetron (ZOFRAN) IV, polyethylene glycol, sodium chloride flush, sodium chloride flush   Antimicrobials: Anti-infectives (From admission, onward)    None       Objective: Vitals:   08/05/21 0105 08/05/21 0356  BP: (!) 168/69 (!) 137/56  Pulse: 74 (!) 57  Resp: 18   Temp: 99 F (37.2 C) 99 F (37.2 C)  SpO2: 94% 100%    Intake/Output Summary (Last 24 hours) at 08/05/2021 1008 Last data filed at 08/05/2021 0537 Gross per 24 hour  Intake 417 ml  Output 650 ml  Net -233 ml   Filed Weights   08/02/21 1258 08/02/21 1900 08/05/21 0610  Weight: 79.8 kg 74 kg 76.9 kg   Weight change:  Body mass index is 27.37 kg/m.   Physical Exam: General exam: pleasant, not in distress Skin: No rashes, lesions or ulcers. HEENT: Atraumatic, normocephalic, no obvious bleeding Lungs: CTAB CVS: S1, S2, Mo GI/Abd soft, nontender, nondistended BS+ CNS: AAOX3 Psychiatry: mood appropriate Extremities: no pedal edema. No calf tenderness.  Data Review: I have personally reviewed the laboratory data and studies available.  F/u labs  Unresulted Labs (From admission, onward)     Start     Ordered   08/06/21 1027  Basic metabolic panel  Tomorrow morning,   R       Question:  Specimen collection method  Answer:  Lab=Lab collect   08/05/21 0749   08/05/21 0500  Heparin level (unfractionated)  Daily,   R     Question:  Specimen collection method  Answer:  Lab=Lab collect   08/04/21 1811   08/04/21 0500  CBC  Daily,   R      08/03/21 0021   08/04/21 0500  APTT  Daily,   R      08/03/21 1735   08/02/21 1323  CBC with Differential/Platelet  Once,    R        08/02/21 1322            Signed, Terrilee Croak, MD [facility] 08/05/2021

## 2021-08-05 NOTE — Interval H&P Note (Signed)
Cath Lab Visit (complete for each Cath Lab visit)  Clinical Evaluation Leading to the Procedure:   ACS: Yes.    Non-ACS:    Anginal Classification: CCS IV  Anti-ischemic medical therapy: Minimal Therapy (1 class of medications)  Non-Invasive Test Results: No non-invasive testing performed  Prior CABG: No previous CABG      History and Physical Interval Note:  08/05/2021 2:02 PM  Cassandra Adams  has presented today for surgery, with the diagnosis of Staged PCI.  The various methods of treatment have been discussed with the patient and family. After consideration of risks, benefits and other options for treatment, the patient has consented to  Procedure(s): CORONARY STENT INTERVENTION (N/A) CORONARY ATHERECTOMY (N/A) as a surgical intervention.  The patient's history has been reviewed, patient examined, no change in status, stable for surgery.  I have reviewed the patient's chart and labs.  Questions were answered to the patient's satisfaction.     Sherren Mocha

## 2021-08-05 NOTE — H&P (View-Only) (Signed)
Progress Note  Patient Name: Cassandra Adams Date of Encounter: 08/05/2021  [facility] Cardiologist: [name], MD   Subjective   No CP or dyspnea; complains of hip pain  Inpatient Medications    Scheduled Meds:  amLODipine  5 mg Oral Daily   [START ON 08/06/2021] aspirin EC  81 mg Oral Daily   atorvastatin  80 mg Oral QPM   clopidogrel  75 mg Oral Q breakfast   insulin aspart  0-15 Units Subcutaneous TID WC   insulin aspart  0-5 Units Subcutaneous QHS   insulin glargine-yfgn  10 Units Subcutaneous QHS   pantoprazole  40 mg Oral Daily   sodium chloride flush  3 mL Intravenous Q12H   sodium chloride flush  3 mL Intravenous Q12H   sodium chloride flush  3 mL Intravenous Q12H   Continuous Infusions:  sodium chloride     sodium chloride     sodium chloride     sodium chloride 75 mL/hr at 08/05/21 0030   heparin 1,000 Units/hr (08/04/21 2025)   PRN Meds: sodium chloride, sodium chloride, sodium chloride, acetaminophen **OR** acetaminophen, ondansetron **OR** ondansetron (ZOFRAN) IV, polyethylene glycol, sodium chloride flush, sodium chloride flush   Vital Signs    Vitals:   08/04/21 2116 08/05/21 0105 08/05/21 0356 08/05/21 0610  BP: (!) 169/64 (!) 168/69 (!) 137/56   Pulse: 70 74 (!) 57   Resp: 17 18    Temp: 98.8 F (37.1 C) 99 F (37.2 C) 99 F (37.2 C)   TempSrc: Oral Oral    SpO2: 98% 94% 100%   Weight:    76.9 kg  Height:        Intake/Output Summary (Last 24 hours) at 08/05/2021 0739 Last data filed at 08/05/2021 0537 Gross per 24 hour  Intake 417 ml  Output 650 ml  Net -233 ml   Last 3 Weights 08/05/2021 08/02/2021 08/02/2021  Weight (lbs) 169 lb 9.6 oz 163 lb 1.6 oz 175 lb 14.8 oz  Weight (kg) 76.93 kg 73.982 kg 79.8 kg      Telemetry    Sinus - Personally Reviewed    Physical Exam   GEN: No acute distress.   Neck: No JVD Cardiac: RRR, no murmurs, rubs, or gallops.  Respiratory: Clear to auscultation bilaterally. GI: Soft,  nontender, non-distended  MS: No edema Neuro:  Nonfocal  Psych: Normal affect   Labs    High Sensitivity Troponin:   Recent Labs  Lab 08/02/21 1320 08/02/21 1522 08/02/21 2131 08/03/21 0005 08/03/21 0455  TROPONINIHS 36* 127* 1,022* 1,014* 946*     Chemistry Recent Labs  Lab 08/02/21 1320 08/03/21 0005 08/04/21 0638 08/05/21 0305  NA 133* 140 140 138  K 4.0 4.5 4.3 4.5  CL 101 108 112* 108  CO2 20* 25 26 23   GLUCOSE 362* 304* 109* 166*  BUN 41* 39* 29* 26*  CREATININE 1.71* 1.57* 1.33* 1.38*  CALCIUM 9.2 9.4 9.0 9.3  PROT 6.0*  --   --   --   ALBUMIN 3.3*  --   --   --   AST 24  --   --   --   ALT 18  --   --   --   ALKPHOS 52  --   --   --   BILITOT 0.5  --   --   --   GFRNONAA 31* 34* 41* 40*  ANIONGAP 12 7 2* 7     Hematology Recent Labs  Lab  08/03/21 0005 08/04/21 0435 08/05/21 0305  WBC 5.8 5.0 4.0  RBC 3.89 3.61* 3.85*  HGB 11.0* 9.8* 10.8*  HCT 31.2* 29.0* 30.2*  MCV 80.2 80.3 78.4*  MCH 28.3 27.1 28.1  MCHC 35.3 33.8 35.8  RDW 14.3 14.6 14.6  PLT 167 146* 155   BNP Recent Labs  Lab 08/02/21 1320  BNP 95.0     Radiology    CARDIAC CATHETERIZATION  Result Date: 08/04/2021 Conclusions: Severe two-vessel coronary artery disease, including 70% proximal/mid LAD stenosis with heavy calcification that is significant by RFR (0.71), and chronic total occlusion of mid RCA. Normal left ventricular filling pressure. Calcified aorta consistent with "porcelain aorta." Recommendations: Staged PCI/atherectomy of proximal/mid LAD if renal function remains stable. Restart IV heparin 2 hours after TR band removal.  Will load with clopidogrel today in anticipation of PCI tomorrow. Obtain echocardiogram to ensure LVEF is stable. Aggressive secondary prevention of coronary artery disease. Nelva Bush, MD [facility]  ECHOCARDIOGRAM COMPLETE  Result Date: 08/04/2021    ECHOCARDIOGRAM REPORT   Patient Name:   Cassandra Adams [facility] Date of Exam: 08/04/2021  Medical Rec #:  [id number]            Height:       66.0 in Accession #:    [id number]           Weight:       163.1 lb Date of Birth:  [birth year]            BSA:          1.834 m Patient Age:    76 years             BP:           157/53 mmHg Patient Gender: F                    HR:           57 bpm. Exam Location:  Inpatient Procedure: 2D Echo Indications:    Acute myocardial infarction  History:        Patient has prior history of Echocardiogram examinations, most                 recent 06/13/2021. CAD, Arrythmias:Atrial Fibrillation; Risk                 Factors:Diabetes and Hypertension.  Sonographer:    Jefferey Pica Referring Phys: [place]  1. Left ventricular ejection fraction, by estimation, is 60 to 65%. The left ventricle has normal function. The left ventricle demonstrates regional wall motion abnormalities (see scoring diagram/findings for description). There is severe asymmetric left ventricular hypertrophy. Left ventricular diastolic parameters are consistent with Grade III diastolic dysfunction (restrictive). Elevated left ventricular end-diastolic pressure. There is mild hypokinesis of the left ventricular, basal inferior wall.  2. Right ventricular systolic function is normal. The right ventricular size is normal. There is normal pulmonary artery systolic pressure.  3. Left atrial size was severely dilated.  4. The mitral valve is normal in structure. Trivial mitral valve regurgitation. No evidence of mitral stenosis.  5. The aortic valve is tricuspid. There is mild calcification of the aortic valve. There is mild thickening of the aortic valve. Aortic valve regurgitation is not visualized. Aortic valve sclerosis/calcification is present, without any evidence of aortic stenosis.  6. The inferior vena cava is normal in size with <50% respiratory variability, suggesting right atrial pressure of 8 mmHg. Comparison(s): No significant change from  prior study. FINDINGS  Left  Ventricle: Left ventricular ejection fraction, by estimation, is 60 to 65%. The left ventricle has normal function. The left ventricle demonstrates regional wall motion abnormalities. Mild hypokinesis of the left ventricular, basal inferior wall. The left ventricular internal cavity size was normal in size. There is severe asymmetric left ventricular hypertrophy. Left ventricular diastolic parameters are consistent with Grade III diastolic dysfunction (restrictive). Elevated left ventricular end-diastolic pressure. Right Ventricle: The right ventricular size is normal. Right vetricular wall thickness was not well visualized. Right ventricular systolic function is normal. There is normal pulmonary artery systolic pressure. The tricuspid regurgitant velocity is 2.59 m/s, and with an assumed right atrial pressure of 8 mmHg, the estimated right ventricular systolic pressure is 63.0 mmHg. Left Atrium: Left atrial size was severely dilated. Right Atrium: Right atrial size was normal in size. Pericardium: Trivial pericardial effusion is present. Mitral Valve: The mitral valve is normal in structure. Trivial mitral valve regurgitation. No evidence of mitral valve stenosis. Tricuspid Valve: The tricuspid valve is normal in structure. Tricuspid valve regurgitation is trivial. No evidence of tricuspid stenosis. Aortic Valve: The aortic valve is tricuspid. There is mild calcification of the aortic valve. There is mild thickening of the aortic valve. Aortic valve regurgitation is not visualized. Aortic valve sclerosis/calcification is present, without any evidence of aortic stenosis. Aortic valve peak gradient measures 12.8 mmHg. Pulmonic Valve: The pulmonic valve was grossly normal. Pulmonic valve regurgitation is trivial. No evidence of pulmonic stenosis. Aorta: The aortic root, ascending aorta, aortic arch and descending aorta are all structurally normal, with no evidence of dilitation or obstruction. Venous: The inferior  vena cava is normal in size with less than 50% respiratory variability, suggesting right atrial pressure of 8 mmHg. IAS/Shunts: The atrial septum is grossly normal.  LEFT VENTRICLE PLAX 2D LVIDd:         4.10 cm   Diastology LVIDs:         2.50 cm   LV e' medial:    4.84 cm/s LV PW:         1.30 cm   LV E/e' medial:  20.0 LV IVS:        1.20 cm   LV e' lateral:   8.45 cm/s LVOT diam:     2.00 cm   LV E/e' lateral: 11.5 LV SV:         92 LV SV Index:   50 LVOT Area:     3.14 cm  RIGHT VENTRICLE             IVC RV S prime:     15.30 cm/s  IVC diam: 1.20 cm TAPSE (M-mode): 2.5 cm LEFT ATRIUM             Index        RIGHT ATRIUM           Index LA diam:        4.40 cm 2.40 cm/m   RA Area:     11.60 cm LA Vol (A2C):   98.9 ml 53.93 ml/m  RA Volume:   23.90 ml  13.03 ml/m LA Vol (A4C):   77.3 ml 42.15 ml/m LA Biplane Vol: 88.8 ml 48.42 ml/m  AORTIC VALVE                 PULMONIC VALVE AV Area (Vmax): 2.42 cm     PV Vmax:       1.14 m/s AV Vmax:  179.00 cm/s  PV Peak grad:  5.2 mmHg AV Peak Grad:   12.8 mmHg LVOT Vmax:      138.00 cm/s LVOT Vmean:     81.400 cm/s LVOT VTI:       0.294 m  AORTA Ao Root diam: 3.30 cm Ao Asc diam:  3.50 cm MITRAL VALVE               TRICUSPID VALVE MV Area (PHT): 3.11 cm    TR Peak grad:   26.8 mmHg MV Decel Time: 244 msec    TR Vmax:        259.00 cm/s MV E velocity: 96.80 cm/s MV A velocity: 44.60 cm/s  SHUNTS MV E/A ratio:  2.17        Systemic VTI:  0.29 m                            Systemic Diam: 2.00 cm Buford Dresser MD Electronically signed by Buford Dresser MD Signature Date/Time: 08/04/2021/8:19:29 PM    Final       Patient Profile     76 y.o. female with past medical history of coronary artery disease, paroxysmal atrial fibrillation, chronic stage III kidney disease, hypertension, sickle cell trait, previous TIA, diabetes mellitus admitted with non-ST elevation myocardial infarction.  Echocardiogram shows normal LV function, grade 3 diastolic  dysfunction, severe left atrial enlargement.  Cardiac catheterization reveals 70% proximal to mid LAD lesion significant by flow wire and chronic total occlusion of mid right coronary artery.  Plan is for PCI today.  Assessment & Plan    1 non-ST elevation myocardial infarction-plan to continue aspirin, Plavix, heparin and statin.  No beta-blocker as patient has a history of bradycardia.  Plan is for PCI of LAD today.  2 chronic stage IIIa kidney disease-creatinine unchanged today.  Follow closely after PCI.  Patient has been hydrated prior to PCI.  3 hypertension-blood pressure elevated.  Amlodipine added.  Advance as needed.  4 paroxysmal atrial fibrillation-patient remains in sinus rhythm.  We will need to resume apixaban once all procedures complete.  5 microcytic anemia-this appears to be chronic in reviewing previous laboratories.  For questions or updates, please contact [facility] Please consult [email or web address] for contact info under        Signed, Kirk Ruths, MD  08/05/2021, 7:39 AM

## 2021-08-06 ENCOUNTER — Other Ambulatory Visit: Payer: Self-pay | Admitting: Cardiology

## 2021-08-06 DIAGNOSIS — I10 Essential (primary) hypertension: Secondary | ICD-10-CM | POA: Diagnosis not present

## 2021-08-06 DIAGNOSIS — I2583 Coronary atherosclerosis due to lipid rich plaque: Secondary | ICD-10-CM

## 2021-08-06 DIAGNOSIS — I480 Paroxysmal atrial fibrillation: Secondary | ICD-10-CM | POA: Diagnosis not present

## 2021-08-06 DIAGNOSIS — I214 Non-ST elevation (NSTEMI) myocardial infarction: Secondary | ICD-10-CM | POA: Diagnosis not present

## 2021-08-06 DIAGNOSIS — N1832 Chronic kidney disease, stage 3b: Secondary | ICD-10-CM

## 2021-08-06 DIAGNOSIS — T50905D Adverse effect of unspecified drugs, medicaments and biological substances, subsequent encounter: Secondary | ICD-10-CM

## 2021-08-06 DIAGNOSIS — I2510 Atherosclerotic heart disease of native coronary artery without angina pectoris: Secondary | ICD-10-CM | POA: Diagnosis not present

## 2021-08-06 LAB — BASIC METABOLIC PANEL
Anion gap: 5 (ref 5–15)
BUN: 20 mg/dL (ref 8–23)
CO2: 22 mmol/L (ref 22–32)
Calcium: 9.3 mg/dL (ref 8.9–10.3)
Chloride: 111 mmol/L (ref 98–111)
Creatinine, Ser: 1.43 mg/dL — ABNORMAL HIGH (ref 0.44–1.00)
GFR, Estimated: 38 mL/min — ABNORMAL LOW (ref >60)
Glucose, Bld: 155 mg/dL — ABNORMAL HIGH (ref 70–99)
Potassium: 4.1 mmol/L (ref 3.5–5.1)
Sodium: 138 mmol/L (ref 135–145)

## 2021-08-06 LAB — CBC
HCT: 29.2 % — ABNORMAL LOW (ref 36.0–46.0)
Hemoglobin: 10 g/dL — ABNORMAL LOW (ref 12.0–15.0)
MCH: 28 pg (ref 26.0–34.0)
MCHC: 34.2 g/dL (ref 30.0–36.0)
MCV: 81.8 fL (ref 80.0–100.0)
Platelets: 100 10*3/uL — ABNORMAL LOW (ref 150–400)
RBC: 3.57 MIL/uL — ABNORMAL LOW (ref 3.87–5.11)
RDW: 14.8 % (ref 11.5–15.5)
WBC: 3.2 10*3/uL — ABNORMAL LOW (ref 4.0–10.5)
nRBC: 0 % (ref 0.0–0.2)

## 2021-08-06 LAB — GLUCOSE, CAPILLARY
Glucose-Capillary: 128 mg/dL — ABNORMAL HIGH (ref 70–99)
Glucose-Capillary: 119 mg/dL — ABNORMAL HIGH (ref 70–99)
Glucose-Capillary: 211 mg/dL — ABNORMAL HIGH (ref 70–99)
Glucose-Capillary: 143 mg/dL — ABNORMAL HIGH (ref 70–99)

## 2021-08-06 MED ORDER — HYDRALAZINE HCL 25 MG PO TABS
25.0000 mg | ORAL_TABLET | Freq: Three times a day (TID) | ORAL | Status: DC
  Administered 2021-08-06 – 2021-08-07 (×3): 25 mg via ORAL
  Filled 2021-08-06 (×3): qty 1

## 2021-08-06 MED ORDER — FUROSEMIDE 40 MG PO TABS
40.0000 mg | ORAL_TABLET | Freq: Every day | ORAL | Status: DC
  Administered 2021-08-06: 40 mg via ORAL
  Filled 2021-08-06: qty 1

## 2021-08-06 MED ORDER — TRAMADOL HCL 50 MG PO TABS
50.0000 mg | ORAL_TABLET | Freq: Four times a day (QID) | ORAL | Status: DC | PRN
  Administered 2021-08-06: 50 mg via ORAL
  Filled 2021-08-06: qty 1

## 2021-08-06 MED ORDER — DRONEDARONE HCL 400 MG PO TABS
400.0000 mg | ORAL_TABLET | Freq: Two times a day (BID) | ORAL | Status: DC
  Administered 2021-08-06 – 2021-08-07 (×2): 400 mg via ORAL
  Filled 2021-08-06 (×3): qty 1

## 2021-08-06 NOTE — Progress Notes (Signed)
PROGRESS NOTE  Cassandra Adams  DOB: [birth year]  PCP: Sharilyn Sites, MD XLK:[id number]  DOA: 08/02/2021  LOS: 3 days  Hospital Day: 5  Brief narrative: Cassandra Adams is a 76 y.o. female with PMH significant for DM 2, HTN, paroxysmal A-fib, CAD/stents, chronic diastolic CHF, CKD 3, TIA, sickle cell trait  Patient presented to the ED at [facility] on 2/7 for left-sided chest pain, shortness of breath.  In the ED, her blood pressure was low at 84/48, improved with 1 L normal saline. Troponin increased from 36 to 127, EKG with T wave inversions Chest x-ray unremarkable Admitted to hospitalist service Cardiology consult was obtained Patient's anginal symptoms, EKG changes and troponin elevation were consistent with NSTEMI.  Patient was started on heparin drip and transferred to [facility] for cardiac cath. 2/9, cardiac cath showed CTO of RCA with collaterals and 70% proximal/mid LAD. 2/10, patient underwent atherectomy and stenting of LAD  Subjective: Patient was seen and examined this am.  Lying down in bed.  Complains of pain throughout the right upper extremity.  Also has headache.  Does not feel comfortable with discharge today.  Discharge diagnosis: Principal Problem:   NSTEMI (non-ST elevated myocardial infarction) ([place]) Active Problems:   Type 2 diabetes mellitus with diabetic neuropathy (HCC)   Essential hypertension, benign   PAF (paroxysmal atrial fibrillation) (HCC)   Carotid stenosis, right   Stage 3b chronic kidney disease (CKD) (HCC)   CAD (coronary artery disease)   Assessment and plan: NSTEMI (non-ST elevated myocardial infarction) ([place]) History of CAD -Presented with chest pain, shortness of breath, noted to have elevated troponin EKG with T wave inversion -Seen by cardiology.  Started on heparin drip.  -2/9, cardiac cath showed CTO of RCA with collaterals and 70% proximal/mid LAD. -2/10, underwent atherectomy and stenting of LAD. -Per cardiology  recommendation, patient was resumed on Eliquis 5 mg twice daily.  Patient will continue Plavix 75 mg daily for 6 months.  Patient will avoid aspirin until clopidogrel is discontinued (high bleeding risk in a 76 year old diabetic woman on triple therapy). -Patient complains of pain at the site of catheter access all the way up to her right shoulder.  I do not see any evidence of right wrist hematoma or ischemic changes in the right hand.  I ordered for tramadol as needed  Chronic CHF Essential hypertension -Home meds include Lasix 40 mg daily and 40 mg as needed, hydralazine 25 mg 3 times daily, lisinopril 10 mg daily -Current blood pressure medicines include amlodipine 5 mg daily only -Blood pressure is in 140s and 150s, heart rate in 50s -Discussed with cardiologist Dr. Radford Pax this morning.  Recommended to resume hydralazine.  Lisinopril and Lasix on hold.  Type 2 diabetes mellitus -A1c 8.1 on December 2022 -Home meds include Lantus 20 once daily at bedtime, Jardiance 10 mg daily, Premeal sliding scale insulin, -Currently on Lantus 10 units daily and sliding scale insulin. -Blood sugar level remains controlled consistently less than 150. Recent Labs  Lab 08/05/21 1125 08/05/21 1614 08/05/21 2125 08/06/21 0748 08/06/21 1157  GLUCAP 100* 136* 97 128* 119*   CKD 3B -Baseline creatinine 1.2-1.4.  Remains at baseline at this time. Recent Labs    02/10/21 1618 06/11/21 1400 06/12/21 0525 06/13/21 0402 06/14/21 0909 08/02/21 1320 08/03/21 0005 08/04/21 0638 08/05/21 0305 08/06/21 0244  BUN 32* 27* 25* 23 19 41* 39* 29* 26* 20  CREATININE 1.34* 1.33* 1.20* 1.40* 1.26* 1.71* 1.57* 1.33* 1.38* 1.43*   Carotid stenosis, right -  h/o right enterectomy -Carotid Doppler on 07/28/2021 did not show any further stenosis.   PAF (paroxysmal atrial fibrillation)  History of SSS -Currently normal sinus rhythm.  Not on beta-blocker because of history of SSS.  Eliquis resumed.    Mobility:  encourage ambulation Goals of care   Code Status: Full Code    Nutritional status:  Body mass index is 27.58 kg/m.      Diet:  Diet Order             Diet Carb Modified Fluid consistency: Thin; Room service appropriate? Yes  Diet effective now                   DVT prophylaxis:   apixaban (ELIQUIS) tablet 5 mg   Antimicrobials: none Fluid: none Consultants: cardiology Family Communication: none at bedside.  Status is: inpatient  Continue in-hospital care because: Cardiac cath with stenting yesterday Level of care: Telemetry Cardiac   Dispo: The patient is from: home              Anticipated d/c is to: home in 1-2 days              Patient currently is not medically stable to d/c.   Difficult to place patient No     Infusions:   sodium chloride     sodium chloride     aminophylline (Cath Lab Atherectomy) bolus      Scheduled Meds:  amLODipine  5 mg Oral Daily   apixaban  5 mg Oral BID   atorvastatin  80 mg Oral QPM   clopidogrel  75 mg Oral Q breakfast   dronedarone  400 mg Oral BID WC   hydrALAZINE  25 mg Oral TID   insulin aspart  0-15 Units Subcutaneous TID WC   insulin aspart  0-5 Units Subcutaneous QHS   insulin glargine-yfgn  10 Units Subcutaneous QHS   pantoprazole  40 mg Oral Daily   sodium chloride flush  3 mL Intravenous Q12H   sodium chloride flush  3 mL Intravenous Q12H    PRN meds: sodium chloride, sodium chloride, acetaminophen **OR** acetaminophen, ondansetron **OR** ondansetron (ZOFRAN) IV, polyethylene glycol, sodium chloride flush, traMADol   Antimicrobials: Anti-infectives (From admission, onward)    None       Objective: Vitals:   08/06/21 0809 08/06/21 1152  BP: (!) 147/64 (!) 147/63  Pulse: (!) 53 (!) 56  Resp: 16 16  Temp: (!) 97.2 F (36.2 C) 98.2 F (36.8 C)  SpO2: 98% 98%    Intake/Output Summary (Last 24 hours) at 08/06/2021 1429 Last data filed at 08/06/2021 1218 Gross per 24 hour  Intake 424.33 ml   Output 400 ml  Net 24.33 ml   Filed Weights   08/02/21 1900 08/05/21 0610 08/06/21 0428  Weight: 74 kg 76.9 kg 77.5 kg   Weight change: 0.59 kg Body mass index is 27.58 kg/m.   Physical Exam: General exam: pleasant,  Skin: No rashes, lesions or ulcers. HEENT: Atraumatic, normocephalic, no obvious bleeding Lungs: Clear to auscultate bilaterally CVS: S1, S2, Mo GI/Abd soft, nontender, nondistended BS+ CNS: Alert, awake, oriented x3 Psychiatry: mood appropriate Extremities: no pedal edema. No calf tenderness.  Right upper extremity from the wrist to shoulder.  Data Review: I have personally reviewed the laboratory data and studies available.  F/u labs  Unresulted Labs (From admission, onward)     Start     Ordered   08/04/21 0500  CBC  Daily,   R  08/03/21 0021   08/02/21 1323  CBC with Differential/Platelet  Once,   R        08/02/21 1322            Signed, Terrilee Croak, MD [facility] 08/06/2021

## 2021-08-06 NOTE — Progress Notes (Addendum)
Progress Note  Patient Name: Cassandra Adams Date of Encounter: 08/06/2021  [facility] Cardiologist: [name], MD   Subjective   Denies any chest pain or shortness of breath  Inpatient Medications    Scheduled Meds:  amLODipine  5 mg Oral Daily   apixaban  5 mg Oral BID   atorvastatin  80 mg Oral QPM   clopidogrel  75 mg Oral Q breakfast   insulin aspart  0-15 Units Subcutaneous TID WC   insulin aspart  0-5 Units Subcutaneous QHS   insulin glargine-yfgn  10 Units Subcutaneous QHS   pantoprazole  40 mg Oral Daily   sodium chloride flush  3 mL Intravenous Q12H   sodium chloride flush  3 mL Intravenous Q12H   Continuous Infusions:  sodium chloride     sodium chloride     aminophylline (Cath Lab Atherectomy) bolus     PRN Meds: sodium chloride, sodium chloride, acetaminophen **OR** acetaminophen, ondansetron **OR** ondansetron (ZOFRAN) IV, polyethylene glycol, sodium chloride flush   Vital Signs    Vitals:   08/05/21 1945 08/05/21 2343 08/06/21 0428 08/06/21 0809  BP: (!) 124/55 139/60 (!) 147/55 (!) 147/64  Pulse: 61 62 (!) 55 (!) 53  Resp:    16  Temp: 98.6 F (37 C) 98.5 F (36.9 C) 97.9 F (36.6 C) (!) 97.2 F (36.2 C)  TempSrc: Oral Oral Oral Oral  SpO2: 97% 98% 98% 98%  Weight:   77.5 kg   Height:        Intake/Output Summary (Last 24 hours) at 08/06/2021 0953 Last data filed at 08/05/2021 2343 Gross per 24 hour  Intake 1266.16 ml  Output 900 ml  Net 366.16 ml    Last 3 Weights 08/06/2021 08/05/2021 08/02/2021  Weight (lbs) 170 lb 14.4 oz 169 lb 9.6 oz 163 lb 1.6 oz  Weight (kg) 77.52 kg 76.93 kg 73.982 kg      Telemetry    Normal sinus rhythm- Personally Reviewed    Physical Exam   GEN: Well nourished, well developed in no acute distress HEENT: Normal NECK: No JVD; No carotid bruits LYMPHATICS: No lymphadenopathy CARDIAC:RRR, no murmurs, rubs, gallops RESPIRATORY:  Clear to auscultation without rales, wheezing or rhonchi   ABDOMEN: Soft, non-tender, non-distended MUSCULOSKELETAL:  No edema; No deformity  SKIN: Warm and dry NEUROLOGIC:  Alert and oriented x 3 PSYCHIATRIC:  Normal affect   Labs    High Sensitivity Troponin:   Recent Labs  Lab 08/02/21 1320 08/02/21 1522 08/02/21 2131 08/03/21 0005 08/03/21 0455  TROPONINIHS 36* 127* 1,022* 1,014* 946*      Chemistry Recent Labs  Lab 08/02/21 1320 08/03/21 0005 08/04/21 0638 08/05/21 0305 08/06/21 0244  NA 133*   < > 140 138 138  K 4.0   < > 4.3 4.5 4.1  CL 101   < > 112* 108 111  CO2 20*   < > 26 23 22   GLUCOSE 362*   < > 109* 166* 155*  BUN 41*   < > 29* 26* 20  CREATININE 1.71*   < > 1.33* 1.38* 1.43*  CALCIUM 9.2   < > 9.0 9.3 9.3  PROT 6.0*  --   --   --   --   ALBUMIN 3.3*  --   --   --   --   AST 24  --   --   --   --   ALT 18  --   --   --   --  ALKPHOS 52  --   --   --   --   BILITOT 0.5  --   --   --   --   GFRNONAA 31*   < > 41* 40* 38*  ANIONGAP 12   < > 2* 7 5   < > = values in this interval not displayed.      Hematology Recent Labs  Lab 08/04/21 0435 08/05/21 0305 08/06/21 0244  WBC 5.0 4.0 3.2*  RBC 3.61* 3.85* 3.57*  HGB 9.8* 10.8* 10.0*  HCT 29.0* 30.2* 29.2*  MCV 80.3 78.4* 81.8  MCH 27.1 28.1 28.0  MCHC 33.8 35.8 34.2  RDW 14.6 14.6 14.8  PLT 146* 155 100*    BNP Recent Labs  Lab 08/02/21 1320  BNP 95.0      Radiology    CARDIAC CATHETERIZATION  Result Date: 08/05/2021   Prox LAD to Mid LAD lesion is 70% stenosed.   Dist Cx lesion is 25% stenosed.   Prox RCA lesion is 90% stenosed.   Mid RCA lesion is 100% stenosed.   1st Diag lesion is 75% stenosed.   A drug-eluting stent was successfully placed using a STENT ONYX FRONTIER 3.0X30.   Post intervention, there is a 0% residual stenosis. Successful orbital atherectomy and stenting of the proximal LAD using a CSI 1.25 mm classic crown device for atherectomy and a 3.0 x 30 mm Onyx frontier drug-eluting stent Recommend: Resume apixaban 5 mg twice  daily tomorrow morning Continue clopidogrel 75 mg daily x6 months Avoid aspirin until clopidogrel is discontinued (high bleeding risk in a 76 year old diabetic woman on triple therapy)  CARDIAC CATHETERIZATION  Result Date: 08/04/2021 Conclusions: Severe two-vessel coronary artery disease, including 70% proximal/mid LAD stenosis with heavy calcification that is significant by RFR (0.71), and chronic total occlusion of mid RCA. Normal left ventricular filling pressure. Calcified aorta consistent with "porcelain aorta." Recommendations: Staged PCI/atherectomy of proximal/mid LAD if renal function remains stable. Restart IV heparin 2 hours after TR band removal.  Will load with clopidogrel today in anticipation of PCI tomorrow. Obtain echocardiogram to ensure LVEF is stable. Aggressive secondary prevention of coronary artery disease. Nelva Bush, MD [facility]  ECHOCARDIOGRAM COMPLETE  Result Date: 08/04/2021    ECHOCARDIOGRAM REPORT   Patient Name:   Cassandra Adams [facility] Date of Exam: 08/04/2021 Medical Rec #:  [id number]            Height:       66.0 in Accession #:    [id number]           Weight:       163.1 lb Date of Birth:  April 10, 1946            BSA:          1.834 m Patient Age:    76 years             BP:           157/53 mmHg Patient Gender: F                    HR:           57 bpm. Exam Location:  Inpatient Procedure: 2D Echo Indications:    Acute myocardial infarction  History:        Patient has prior history of Echocardiogram examinations, most                 recent 06/13/2021. CAD, Arrythmias:Atrial Fibrillation; Risk  Factors:Diabetes and Hypertension.  Sonographer:    Jefferey Pica Referring Phys: [place]  1. Left ventricular ejection fraction, by estimation, is 60 to 65%. The left ventricle has normal function. The left ventricle demonstrates regional wall motion abnormalities (see scoring diagram/findings for description). There is severe  asymmetric left ventricular hypertrophy. Left ventricular diastolic parameters are consistent with Grade III diastolic dysfunction (restrictive). Elevated left ventricular end-diastolic pressure. There is mild hypokinesis of the left ventricular, basal inferior wall.  2. Right ventricular systolic function is normal. The right ventricular size is normal. There is normal pulmonary artery systolic pressure.  3. Left atrial size was severely dilated.  4. The mitral valve is normal in structure. Trivial mitral valve regurgitation. No evidence of mitral stenosis.  5. The aortic valve is tricuspid. There is mild calcification of the aortic valve. There is mild thickening of the aortic valve. Aortic valve regurgitation is not visualized. Aortic valve sclerosis/calcification is present, without any evidence of aortic stenosis.  6. The inferior vena cava is normal in size with <50% respiratory variability, suggesting right atrial pressure of 8 mmHg. Comparison(s): No significant change from prior study. FINDINGS  Left Ventricle: Left ventricular ejection fraction, by estimation, is 60 to 65%. The left ventricle has normal function. The left ventricle demonstrates regional wall motion abnormalities. Mild hypokinesis of the left ventricular, basal inferior wall. The left ventricular internal cavity size was normal in size. There is severe asymmetric left ventricular hypertrophy. Left ventricular diastolic parameters are consistent with Grade III diastolic dysfunction (restrictive). Elevated left ventricular end-diastolic pressure. Right Ventricle: The right ventricular size is normal. Right vetricular wall thickness was not well visualized. Right ventricular systolic function is normal. There is normal pulmonary artery systolic pressure. The tricuspid regurgitant velocity is 2.59 m/s, and with an assumed right atrial pressure of 8 mmHg, the estimated right ventricular systolic pressure is 28.3 mmHg. Left Atrium: Left atrial  size was severely dilated. Right Atrium: Right atrial size was normal in size. Pericardium: Trivial pericardial effusion is present. Mitral Valve: The mitral valve is normal in structure. Trivial mitral valve regurgitation. No evidence of mitral valve stenosis. Tricuspid Valve: The tricuspid valve is normal in structure. Tricuspid valve regurgitation is trivial. No evidence of tricuspid stenosis. Aortic Valve: The aortic valve is tricuspid. There is mild calcification of the aortic valve. There is mild thickening of the aortic valve. Aortic valve regurgitation is not visualized. Aortic valve sclerosis/calcification is present, without any evidence of aortic stenosis. Aortic valve peak gradient measures 12.8 mmHg. Pulmonic Valve: The pulmonic valve was grossly normal. Pulmonic valve regurgitation is trivial. No evidence of pulmonic stenosis. Aorta: The aortic root, ascending aorta, aortic arch and descending aorta are all structurally normal, with no evidence of dilitation or obstruction. Venous: The inferior vena cava is normal in size with less than 50% respiratory variability, suggesting right atrial pressure of 8 mmHg. IAS/Shunts: The atrial septum is grossly normal.  LEFT VENTRICLE PLAX 2D LVIDd:         4.10 cm   Diastology LVIDs:         2.50 cm   LV e' medial:    4.84 cm/s LV PW:         1.30 cm   LV E/e' medial:  20.0 LV IVS:        1.20 cm   LV e' lateral:   8.45 cm/s LVOT diam:     2.00 cm   LV E/e' lateral: 11.5 LV SV:  92 LV SV Index:   50 LVOT Area:     3.14 cm  RIGHT VENTRICLE             IVC RV S prime:     15.30 cm/s  IVC diam: 1.20 cm TAPSE (M-mode): 2.5 cm LEFT ATRIUM             Index        RIGHT ATRIUM           Index LA diam:        4.40 cm 2.40 cm/m   RA Area:     11.60 cm LA Vol (A2C):   98.9 ml 53.93 ml/m  RA Volume:   23.90 ml  13.03 ml/m LA Vol (A4C):   77.3 ml 42.15 ml/m LA Biplane Vol: 88.8 ml 48.42 ml/m  AORTIC VALVE                 PULMONIC VALVE AV Area (Vmax): 2.42 cm      PV Vmax:       1.14 m/s AV Vmax:        179.00 cm/s  PV Peak grad:  5.2 mmHg AV Peak Grad:   12.8 mmHg LVOT Vmax:      138.00 cm/s LVOT Vmean:     81.400 cm/s LVOT VTI:       0.294 m  AORTA Ao Root diam: 3.30 cm Ao Asc diam:  3.50 cm MITRAL VALVE               TRICUSPID VALVE MV Area (PHT): 3.11 cm    TR Peak grad:   26.8 mmHg MV Decel Time: 244 msec    TR Vmax:        259.00 cm/s MV E velocity: 96.80 cm/s MV A velocity: 44.60 cm/s  SHUNTS MV E/A ratio:  2.17        Systemic VTI:  0.29 m                            Systemic Diam: 2.00 cm Buford Dresser MD Electronically signed by Buford Dresser MD Signature Date/Time: 08/04/2021/8:19:29 PM    Final       Patient Profile     76 y.o. female with past medical history of coronary artery disease, paroxysmal atrial fibrillation, chronic stage III kidney disease, hypertension, sickle cell trait, previous TIA, diabetes mellitus admitted with non-ST elevation myocardial infarction.  Echocardiogram shows normal LV function, grade 3 diastolic dysfunction, severe left atrial enlargement.  Cardiac catheterization revealed 70% proximal to mid LAD lesion significant by flow wire and chronic total occlusion of mid right coronary artery.    Assessment & Plan    1 non-ST elevation myocardial infarction -Cardiac catheterization revealed 70% proximal to mid LAD lesion significant by flow wire, 90% proximal RCA and chronic total occlusion of mid right coronary artery and 75% D1 with left to right collaterals to the distal RCA/PDA status post PCI with orbital atherectomy of the LAD.   -She denies any chest pain or shortness of breath  -Continue Plavix 75 mg daily x 6 months and then stop and resume ASA -Avoid aspirin until clopidogrel is discontinued (high bleeding risk in a 76 year old diabetic woman on triple therapy) -Continue Lipitor 80 mg daily and amlodipine 5 mg daily  -No beta-blocker as patient has a history of bradycardia.    2 chronic stage  IIIa kidney disease -Serum creatinine slightly bumped from 1.3->1.43 today but  stable  -Repeating it in 1 week  3 hypertension -Blood pressure borderline elevated on exam today at 147/64 mmHg -Amlodipine was just added 5 mg daily and will take a few days to have full effect -Restart hydralazine 50 mg 3 times daily -Would hold on restarting ACE inhibitor until seen back in the office given her CKD and recent dye exposure  4 paroxysmal atrial fibrillation -She remains in normal sinus rhythm -Continue apixaban 5 mg twice daily -Restart dronedarone 400 mg twice daily which was held on admission  5 microcytic anemia-this appears to be chronic in reviewing previous laboratories.  [facility] will sign off.   Medication Recommendations: Plavix 75 mg daily, atorvastatin 80 mg daily, apixaban 5 mg daily, amlodipine 5 mg daily, Multaq 400 mg twice daily and hydralazine 50 mg 3 times daily Other recommendations (labs, testing, etc): Bmet in 1 week Follow up as an outpatient: Follow-up with Dr. Debara Pickett or extender in 2 weeks.  For questions or updates, please contact [place] Please consult [email or web address] for contact info under        Signed, Fransico Him, MD  08/06/2021, 9:53 AM

## 2021-08-06 NOTE — Progress Notes (Signed)
CARDIAC REHAB PHASE I  [phone] Patient declined to walk at this time, c/o headache, right wrist to shoulder pain, which she rated a 5/10 on the pain scale. Patient states that she's already alerted her nurse of symptoms.  MI/stent education completed with patient including restrictions, risk factor modification, Plavix use, CP, NTG use, and calling 911, and activity progression. MI book, heart healthy & diabetic nutrition handouts, and exercise guidelines given.  Discussed phase 2 cardiac rehab, and patient is interested in program at [facility], referral sent.  Sol Passer, MS, ACSM CEP

## 2021-08-07 DIAGNOSIS — I214 Non-ST elevation (NSTEMI) myocardial infarction: Secondary | ICD-10-CM | POA: Diagnosis not present

## 2021-08-07 LAB — CBC
HCT: 28.2 % — ABNORMAL LOW (ref 36.0–46.0)
Hemoglobin: 9.7 g/dL — ABNORMAL LOW (ref 12.0–15.0)
MCH: 27.5 pg (ref 26.0–34.0)
MCHC: 34.4 g/dL (ref 30.0–36.0)
MCV: 79.9 fL — ABNORMAL LOW (ref 80.0–100.0)
Platelets: 127 10*3/uL — ABNORMAL LOW (ref 150–400)
RBC: 3.53 MIL/uL — ABNORMAL LOW (ref 3.87–5.11)
RDW: 14.6 % (ref 11.5–15.5)
WBC: 4 10*3/uL (ref 4.0–10.5)
nRBC: 0 % (ref 0.0–0.2)

## 2021-08-07 LAB — GLUCOSE, CAPILLARY
Glucose-Capillary: 118 mg/dL — ABNORMAL HIGH (ref 70–99)
Glucose-Capillary: 153 mg/dL — ABNORMAL HIGH (ref 70–99)

## 2021-08-07 MED ORDER — AMLODIPINE BESYLATE 5 MG PO TABS: 5.0000 mg | ORAL_TABLET | Freq: Every day | ORAL | 2 refills | Status: DC

## 2021-08-07 MED ORDER — CLOPIDOGREL BISULFATE 75 MG PO TABS: 75.0000 mg | ORAL_TABLET | Freq: Every day | ORAL | 2 refills | Status: DC

## 2021-08-07 MED ORDER — TRAMADOL HCL 50 MG PO TABS: 50.0000 mg | ORAL_TABLET | Freq: Four times a day (QID) | ORAL | 0 refills | Status: AC | PRN

## 2021-08-07 MED ORDER — BASAGLAR KWIKPEN 100 UNIT/ML ~~LOC~~ SOPN: 10.0000 [IU] | PEN_INJECTOR | Freq: Every day | SUBCUTANEOUS | 0 refills | Status: DC

## 2021-08-07 NOTE — Discharge Summary (Signed)
Physician Discharge Summary  Cassandra Adams OQH:[id number] DOB: April 11, 1946 DOA: 08/02/2021  PCP: Sharilyn Sites, MD  Admit date: 08/02/2021 Discharge date: 08/07/2021  Admitted From: Home Discharge disposition: Home  Recommendations at discharge:  You have been started on Plavix.  Continue it with Eliquis.  Avoid aspirin.  Your blood pressure medicines have been adjusted.  Lisinopril has been stopped.  Amlodipine has been added.  Follow-up with cardiology as an outpatient Based on your blood sugar levels in the hospital, the dose of Lantus has been reduced to 10 units at bedtime.  Continue to monitor blood sugar at home and titrate insulin level accordingly under the supervision of your primary care provider  Brief narrative: Cassandra Adams is a 76 y.o. female with PMH significant for DM 2, HTN, paroxysmal A-fib, CAD/stents, chronic diastolic CHF, CKD 3, TIA, sickle cell trait  Patient presented to the ED at [facility] on 2/7 for left-sided chest pain, shortness of breath.  In the ED, her blood pressure was low at 84/48, improved with 1 L normal saline. Troponin increased from 36 to 127, EKG with T wave inversions Chest x-ray unremarkable Admitted to hospitalist service Cardiology consult was obtained Patient's anginal symptoms, EKG changes and troponin elevation were consistent with NSTEMI.  Patient was started on heparin drip and transferred to [facility] for cardiac cath. 2/9, cardiac cath showed CTO of RCA with collaterals and 70% proximal/mid LAD. 2/10, patient underwent atherectomy and stenting of LAD  Subjective: Patient was seen and examined this am.  Lying down in bed. Right upper extremity pain and headache improved after tramadol was started yesterday.  Feels ready for discharge today.  Discharge diagnosis: Principal Problem:   NSTEMI (non-ST elevated myocardial infarction) ([place]) Active Problems:   Type 2 diabetes mellitus with diabetic neuropathy (HCC)   Essential  hypertension, benign   PAF (paroxysmal atrial fibrillation) (HCC)   Carotid stenosis, right   Stage 3b chronic kidney disease (CKD) (HCC)   CAD (coronary artery disease)   Hospital course: NSTEMI (non-ST elevated myocardial infarction) ([place]) History of CAD -Presented with chest pain, shortness of breath, noted to have elevated troponin EKG with T wave inversion -Seen by cardiology.  Started on heparin drip.  -2/9, cardiac cath showed CTO of RCA with collaterals and 70% proximal/mid LAD. -2/10, underwent atherectomy and stenting of LAD. -Per cardiology recommendation, patient was resumed on Eliquis 5 mg twice daily.  Patient will continue Plavix 75 mg daily for 6 months.  Patient will avoid aspirin until clopidogrel is discontinued (high bleeding risk in a 76 year old diabetic woman on triple therapy). -Right upper extremity pain improved with tramadol as needed.  No swelling or hematoma at the site of catheter insertion  Chronic CHF Essential hypertension -Home meds include Lasix 40 mg daily and 40 mg as needed, hydralazine 25 mg 3 times daily, lisinopril 10 mg daily. -Discussed with cardiologist Dr. Radford Pax on 2/11.  Lasix and hydralazine have been resumed.  Patient was also started on amlodipine 5 mg daily. -Blood pressure stable on the same.  Type 2 diabetes mellitus -A1c 8.1 on December 2022 -Home meds include Lantus 32 units once daily at bedtime, Jardiance 10 mg daily, Premeal sliding scale insulin, -Currently on Semglee 10 units daily and sliding scale insulin. -Blood sugar level remains controlled consistently less than 150. -At discharge, I would continue her on Lantus 10 units daily, resume Jardiance and continue sliding scale insulin. Recent Labs  Lab 08/06/21 0748 08/06/21 1157 08/06/21 1641 08/06/21 2148 08/07/21 5465  GLUCAP 128* 119* 211* 143* 118*   CKD 3B -Baseline creatinine 1.2-1.4.  Remains at baseline at this time. Recent Labs    02/10/21 1618  06/11/21 1400 06/12/21 0525 06/13/21 0402 06/14/21 0909 08/02/21 1320 08/03/21 0005 08/04/21 0638 08/05/21 0305 08/06/21 0244  BUN 32* 27* 25* 23 19 41* 39* 29* 26* 20  CREATININE 1.34* 1.33* 1.20* 1.40* 1.26* 1.71* 1.57* 1.33* 1.38* 1.43*   Carotid stenosis, right -h/o right enterectomy -Carotid Doppler on 07/28/2021 did not show any further stenosis.   PAF (paroxysmal atrial fibrillation)  History of SSS -Currently normal sinus rhythm.  Not on beta-blocker because of history of SSS.  Patient states, she has not taking Multaq at home.  Eliquis resumed.   Mobility: encourage ambulation Goals of care   Code Status: Full Code    Nutritional status:  Body mass index is 27.58 kg/m.      Diet:  Diet Order             Diet Carb Modified           Diet - low sodium heart healthy           Diet Carb Modified Fluid consistency: Thin; Room service appropriate? Yes  Diet effective now                   Discharge Exam:   Vitals:   08/06/21 2033 08/07/21 0016 08/07/21 0609 08/07/21 0822  BP: (!) 142/54 (!) 157/61 (!) 151/58 (!) 132/47  Pulse: (!) 56 (!) 55 (!) 54 (!) 56  Resp: 20 18 16 18   Temp: 98.1 F (36.7 C) 98.1 F (36.7 C) 98.1 F (36.7 C) 98.4 F (36.9 C)  TempSrc: Oral Oral Oral Oral  SpO2: 98% 98% 99% 98%  Weight:      Height:        Body mass index is 27.58 kg/m.  General exam: pleasant, not in distress Skin: No rashes, lesions or ulcers. HEENT: Atraumatic, normocephalic, no obvious bleeding Lungs: Clear to auscultate bilaterally CVS: S1, S2, Mo GI/Abd soft, nontender, nondistended BS+ CNS: Alert, awake, oriented x3 Psychiatry: mood appropriate Extremities: no pedal edema. No calf tenderness.  Right wrist catheter insertion site intact without swelling or hematoma.  Follow ups:    Follow-up Information     Hilty, Nadean Corwin, MD Follow up.   Specialty: Cardiology Why: call the office Monday afternoon for appointment Contact  information: [place] [address] [place] Follow up.   Specialty: Cardiology Why: please go anytime Thursday AM 08/11/21 for lab work I placed the order. Contact information: [address] [place] [phone]                Discharge Instructions:   Discharge Instructions     Amb Referral to Cardiac Rehabilitation   Complete by: As directed    Diagnosis:  NSTEMI Coronary Stents     After initial evaluation and assessments completed: Virtual Based Care may be provided alone or in conjunction with Phase 2 Cardiac Rehab based on patient barriers.: Yes   Call MD for:  difficulty breathing, headache or visual disturbances   Complete by: As directed    Call MD for:  extreme fatigue   Complete by: As directed    Call MD for:  hives   Complete by: As directed    Call MD for:  persistant dizziness or light-headedness  Complete by: As directed    Call MD for:  persistant nausea and vomiting   Complete by: As directed    Call MD for:  severe uncontrolled pain   Complete by: As directed    Call MD for:  temperature >100.4   Complete by: As directed    Diet - low sodium heart healthy   Complete by: As directed    Diet Carb Modified   Complete by: As directed    Discharge instructions   Complete by: As directed     You have been started on Plavix.  Continue it with Eliquis.  Avoid aspirin.   Your blood pressure medicines have been adjusted.  Lisinopril has been stopped.  Amlodipine has been added.   Follow-up with cardiology as an outpatient  Based on your blood sugar levels in the hospital, the dose of Lantus has been reduced to 10 units at bedtime.  Continue to monitor blood sugar at home and titrate insulin level accordingly under the supervision of your primary care provider  Discharge instructions for diabetes mellitus: Check blood sugar 3 times a day and  bedtime at home. If blood sugar running above 200 or less than 70 please call your MD to adjust insulin. If you notice signs and symptoms of hypoglycemia (low blood sugar) like jitteriness, confusion, thirst, tremor and sweating, please check blood sugar, drink sugary drink/biscuits/sweets to increase sugar level and call MD or return to ER.    Discharge instructions for CHF Check weight daily -preferably same time every day. Restrict fluid intake to 1200 ml daily Restrict salt intake to less than 2 g daily. Call MD if you have one of the following symptoms 1) 3 pound weight gain in 24 hours or 5 pounds in 1 week  2) swelling in the hands, feet or stomach  3) progressive shortness of breath 4) if you have to sleep on extra pillows at night in order to breathe     General discharge instructions: Follow with Primary MD Sharilyn Sites, MD in 7 days  Please request your PCP  to go over your hospital tests, procedures, radiology results at the follow up. Please get your medicines reviewed and adjusted.  Your PCP may decide to repeat certain labs or tests as needed. Do not drive, operate heavy machinery, perform activities at heights, swimming or participation in water activities or provide baby sitting services if your were admitted for syncope or siezures until you have seen by Primary MD or a Neurologist and advised to do so again. [place] Controlled Substance Reporting System database was reviewed. Do not drive, operate heavy machinery, perform activities at heights, swim, participate in water activities or provide baby-sitting services while on medications for pain, sleep and mood until your outpatient physician has reevaluated you and advised to do so again.  You are strongly recommended to comply with the dose, frequency and duration of prescribed medications. Activity: As tolerated with Full fall precautions use walker/cane & assistance as needed Avoid using any recreational  substances like cigarette, tobacco, alcohol, or non-prescribed drug. If you experience worsening of your admission symptoms, develop shortness of breath, life threatening emergency, suicidal or homicidal thoughts you must seek medical attention immediately by calling 911 or calling your MD immediately  if symptoms less severe. You must read complete instructions/literature along with all the possible adverse reactions/side effects for all the medicines you take and that have been prescribed to you. Take any new medicine only after you have completely understood  and accepted all the possible adverse reactions/side effects.  Wear Seat belts while driving. You were cared for by a hospitalist during your hospital stay. If you have any questions about your discharge medications or the care you received while you were in the hospital after you are discharged, you can call the unit and ask to speak with the hospitalist or the covering physician. Once you are discharged, your primary care physician will handle any further medical issues. Please note that NO REFILLS for any discharge medications will be authorized once you are discharged, as it is imperative that you return to your primary care physician (or establish a relationship with a primary care physician if you do not have one).   Increase activity slowly   Complete by: As directed        Discharge Medications:   Allergies as of 08/07/2021       Reactions   Penicillins Hives   Has patient had a PCN reaction causing immediate rash, facial/tongue/throat swelling, SOB or lightheadedness with hypotension: no Has patient had a PCN reaction causing severe rash involving mucus membranes or skin necrosis: No  Has patient had a PCN reaction that required hospitalization: no Has patient had a PCN reaction occurring within the last 10 years: no If all of the above answers are "NO", then may proceed with Cephalosporin use.        Medication List      STOP taking these medications    dronedarone 400 MG tablet Commonly known as: MULTAQ   Entresto 24-26 MG Generic drug: sacubitril-valsartan   lisinopril 20 MG tablet Commonly known as: ZESTRIL   pantoprazole 40 MG tablet Commonly known as: PROTONIX   senna-docusate 8.6-50 MG tablet Commonly known as: Senokot-S       TAKE these medications    amLODipine 5 MG tablet Commonly known as: NORVASC Take 1 tablet (5 mg total) by mouth daily. Start taking on: August 08, 2021   atorvastatin 80 MG tablet Commonly known as: LIPITOR Take 1 tablet (80 mg total) by mouth every evening.   Basaglar KwikPen 100 UNIT/ML Inject 10 Units into the skin at bedtime. What changed: how much to take   BD Insulin Syringe U/F 31G X 5/16" 0.5 ML Misc Generic drug: Insulin Syringe-Needle U-100 Use to take with Novolog [facility]   clopidogrel 75 MG tablet Commonly known as: PLAVIX Take 1 tablet (75 mg total) by mouth daily with breakfast. Start taking on: August 08, 2021   colesevelam 625 MG tablet Commonly known as: WELCHOL Take 1,875 mg by mouth 2 (two) times daily with a meal. Noon and night   Eliquis 5 MG Tabs tablet Generic drug: apixaban Take 1 tablet (5 mg total) by mouth 2 (two) times daily.   FreeStyle Libre 2 Reader Klahr 1 each by Does not apply route daily in the afternoon.   FreeStyle Libre 2 Sensor Misc 1 each by Does not apply route daily in the afternoon.   furosemide 40 MG tablet Commonly known as: LASIX Take 40 mg  one tablet by mouth daily , may take an additional 40 mg if needed daily if leg edema worsens   hydrALAZINE 50 MG tablet Commonly known as: APRESOLINE Take 1 tablet (50 mg total) by mouth 3 (three) times daily. What changed: how much to take   Jardiance 10 MG Tabs tablet Generic drug: empagliflozin Take 10 mg by mouth daily.   nitroGLYCERIN 0.4 MG SL tablet Commonly known as: NITROSTAT PLACE 1 TABLET UNDER THE  TONGUE EVERY 5 MINUTES AS NEEDED  FOR CHEST PAIN (UP TO 3 DOSES) What changed: See the new instructions.   NovoLOG 100 UNIT/ML injection Generic drug: insulin aspart Inject 6-9 Units into the skin 3 (three) times daily with meals. Sliding Scale   polyethylene glycol 17 g packet Commonly known as: MIRALAX / GLYCOLAX Take 17 g by mouth daily.   traMADol 50 MG tablet Commonly known as: ULTRAM Take 1 tablet (50 mg total) by mouth every 6 (six) hours as needed for up to 3 days for moderate pain or severe pain.   Vitamin D (Cholecalciferol) 25 MCG (1000 UT) Tabs Take 2,000 Units by mouth daily.         The results of significant diagnostics from this hospitalization (including imaging, microbiology, ancillary and laboratory) are listed below for reference.    Procedures and Diagnostic Studies:   DG Chest 2 View  Result Date: 08/02/2021 CLINICAL DATA:  chest pain EXAM: CHEST - 2 VIEW COMPARISON:  06/11/2021. FINDINGS: Slight mild elevation the right hemidiaphragm, unchanged. No consolidation. No visible pleural effusions or pneumothorax. Similar cardiomediastinal silhouette. IMPRESSION: No evidence of acute cardiopulmonary disease. Electronically Signed   By: Margaretha Sheffield M.D.   On: 08/02/2021 13:47     Labs:   Basic Metabolic Panel: Recent Labs  Lab 08/02/21 1320 08/03/21 0005 08/04/21 0638 08/05/21 0305 08/06/21 0244  NA 133* 140 140 138 138  K 4.0 4.5 4.3 4.5 4.1  CL 101 108 112* 108 111  CO2 20* 25 26 23 22   GLUCOSE 362* 304* 109* 166* 155*  BUN 41* 39* 29* 26* 20  CREATININE 1.71* 1.57* 1.33* 1.38* 1.43*  CALCIUM 9.2 9.4 9.0 9.3 9.3   GFR Estimated Creatinine Clearance: 35.2 mL/min (A) (by C-G formula based on SCr of 1.43 mg/dL (H)). Liver Function Tests: Recent Labs  Lab 08/02/21 1320  AST 24  ALT 18  ALKPHOS 52  BILITOT 0.5  PROT 6.0*  ALBUMIN 3.3*   No results for input(s): LIPASE, AMYLASE in the last 168 hours. No results for input(s): AMMONIA in the last 168 hours. Coagulation  profile No results for input(s): INR, PROTIME in the last 168 hours.  CBC: Recent Labs  Lab 08/03/21 0005 08/04/21 0435 08/05/21 0305 08/06/21 0244 08/07/21 0258  WBC 5.8 5.0 4.0 3.2* 4.0  HGB 11.0* 9.8* 10.8* 10.0* 9.7*  HCT 31.2* 29.0* 30.2* 29.2* 28.2*  MCV 80.2 80.3 78.4* 81.8 79.9*  PLT 167 146* 155 100* 127*   Cardiac Enzymes: No results for input(s): CKTOTAL, CKMB, CKMBINDEX, TROPONINI in the last 168 hours. BNP: Invalid input(s): POCBNP CBG: Recent Labs  Lab 08/06/21 0748 08/06/21 1157 08/06/21 1641 08/06/21 2148 08/07/21 0823  GLUCAP 128* 119* 211* 143* 118*   D-Dimer No results for input(s): DDIMER in the last 72 hours. Hgb A1c No results for input(s): HGBA1C in the last 72 hours. Lipid Profile No results for input(s): CHOL, HDL, LDLCALC, TRIG, CHOLHDL, LDLDIRECT in the last 72 hours. Thyroid function studies No results for input(s): TSH, T4TOTAL, T3FREE, THYROIDAB in the last 72 hours.  Invalid input(s): FREET3 Anemia work up No results for input(s): VITAMINB12, FOLATE, FERRITIN, TIBC, IRON, RETICCTPCT in the last 72 hours. Microbiology Recent Results (from the past 240 hour(s))  Resp Panel by RT-PCR (Flu A&B, Covid) Nasopharyngeal Swab     Status: None   Collection Time: 08/02/21  5:30 PM   Specimen: Nasopharyngeal Swab; Nasopharyngeal(NP) swabs in vial transport medium  Result Value Ref Range Status   SARS  Coronavirus 2 by RT PCR NEGATIVE NEGATIVE Final    Comment: (NOTE) SARS-CoV-2 target nucleic acids are NOT DETECTED.  The SARS-CoV-2 RNA is generally detectable in upper respiratory specimens during the acute phase of infection. The lowest concentration of SARS-CoV-2 viral copies this assay can detect is 138 copies/mL. A negative result does not preclude SARS-Cov-2 infection and should not be used as the sole basis for treatment or other patient management decisions. A negative result may occur with  improper specimen collection/handling,  submission of specimen other than nasopharyngeal swab, presence of viral mutation(s) within the areas targeted by this assay, and inadequate number of viral copies(<138 copies/mL). A negative result must be combined with clinical observations, patient history, and epidemiological information. The expected result is Negative.  Fact Sheet for Patients:  EntrepreneurPulse.com.au  Fact Sheet for Healthcare Providers:  IncredibleEmployment.be  This test is no t yet approved or cleared by the [place] FDA and  has been authorized for detection and/or diagnosis of SARS-CoV-2 by FDA under an Emergency Use Authorization (EUA). This EUA will remain  in effect (meaning this test can be used) for the duration of the COVID-19 declaration under Section 564(b)(1) of the Act, 21 U.S.C.section 360bbb-3(b)(1), unless the authorization is terminated  or revoked sooner.       Influenza A by PCR NEGATIVE NEGATIVE Final   Influenza B by PCR NEGATIVE NEGATIVE Final    Comment: (NOTE) The Xpert Xpress SARS-CoV-2/FLU/RSV plus assay is intended as an aid in the diagnosis of influenza from Nasopharyngeal swab specimens and should not be used as a sole basis for treatment. Nasal washings and aspirates are unacceptable for Xpert Xpress SARS-CoV-2/FLU/RSV testing.  Fact Sheet for Patients: EntrepreneurPulse.com.au  Fact Sheet for Healthcare Providers: IncredibleEmployment.be  This test is not yet approved or cleared by the [place] FDA and has been authorized for detection and/or diagnosis of SARS-CoV-2 by FDA under an Emergency Use Authorization (EUA). This EUA will remain in effect (meaning this test can be used) for the duration of the COVID-19 declaration under Section 564(b)(1) of the Act, 21 U.S.C. section 360bbb-3(b)(1), unless the authorization is terminated or revoked.  Performed at [facility], [address], [place], [place] [address]     Time coordinating discharge: 35 minutes  Signed: Marlowe Aschoff Louretta Tantillo  [facility] 08/07/2021, 9:43 AM

## 2021-08-07 NOTE — Discharge Instructions (Signed)

## 2021-08-08 ENCOUNTER — Encounter (HOSPITAL_COMMUNITY): Payer: Self-pay | Admitting: Cardiovascular Disease

## 2021-08-11 ENCOUNTER — Other Ambulatory Visit: Payer: Self-pay

## 2021-08-11 DIAGNOSIS — I779 Disorder of arteries and arterioles, unspecified: Secondary | ICD-10-CM | POA: Diagnosis not present

## 2021-08-11 DIAGNOSIS — T50905D Adverse effect of unspecified drugs, medicaments and biological substances, subsequent encounter: Secondary | ICD-10-CM | POA: Diagnosis not present

## 2021-08-11 DIAGNOSIS — Z9889 Other specified postprocedural states: Secondary | ICD-10-CM | POA: Diagnosis not present

## 2021-08-12 DIAGNOSIS — E118 Type 2 diabetes mellitus with unspecified complications: Secondary | ICD-10-CM | POA: Diagnosis not present

## 2021-08-12 DIAGNOSIS — I4891 Unspecified atrial fibrillation: Secondary | ICD-10-CM | POA: Diagnosis not present

## 2021-08-12 DIAGNOSIS — I2510 Atherosclerotic heart disease of native coronary artery without angina pectoris: Secondary | ICD-10-CM | POA: Diagnosis not present

## 2021-08-12 DIAGNOSIS — Z6826 Body mass index (BMI) 26.0-26.9, adult: Secondary | ICD-10-CM | POA: Diagnosis not present

## 2021-08-12 DIAGNOSIS — E663 Overweight: Secondary | ICD-10-CM | POA: Diagnosis not present

## 2021-08-12 DIAGNOSIS — I214 Non-ST elevation (NSTEMI) myocardial infarction: Secondary | ICD-10-CM | POA: Diagnosis not present

## 2021-08-12 DIAGNOSIS — I2583 Coronary atherosclerosis due to lipid rich plaque: Secondary | ICD-10-CM | POA: Diagnosis not present

## 2021-08-12 LAB — BASIC METABOLIC PANEL
BUN/Creatinine Ratio: 20 (ref 12–28)
BUN: 28 mg/dL — ABNORMAL HIGH (ref 8–27)
CO2: 22 mmol/L (ref 20–29)
Calcium: 10.8 mg/dL — ABNORMAL HIGH (ref 8.7–10.3)
Chloride: 105 mmol/L (ref 96–106)
Creatinine, Ser: 1.42 mg/dL — ABNORMAL HIGH (ref 0.57–1.00)
Glucose: 212 mg/dL — ABNORMAL HIGH (ref 70–99)
Potassium: 4.3 mmol/L (ref 3.5–5.2)
Sodium: 145 mmol/L — ABNORMAL HIGH (ref 134–144)
eGFR: 38 mL/min/{1.73_m2} — ABNORMAL LOW (ref >59)

## 2021-08-14 NOTE — Progress Notes (Signed)
Cardiology Clinic Note   Patient Name: Cassandra Adams Date of Encounter: 08/16/2021  Primary Care Provider:  Sharilyn Sites, MD Primary Cardiologist:  [name], MD  Patient Profile    Cassandra Adams 76 year old female presents to the clinic today for follow-up evaluation of her essential hypertension and paroxysmal atrial fibrillation.  Past Medical History    Past Medical History:  Diagnosis Date   (HFpEF) heart failure with preserved ejection fraction ([place])    Limited Echo 7/22: Mild asymmetrical LVH due to scar and thinning of basal posterior wall and background conc LVH, apical and mid segments hyperdynamic, EF 60-65, basal inf-lat and basal inf AK, normal RVSF, RVSP 26.6, mild LAE, mild-moderate MR, mild AV sclerosis w/o AS // Echo 5/22: EF 55-60, RVSP 27, mild to mod LAE, poss small to mod eff; AV sclerosis no AS   Arthritis    "all over"   CAD (coronary artery disease)    a. NSTEMI 12/2013 - occluded dominant RCA with L-R collaterals, moderate prox segmental LAD disease, for medical therapy initially, EF 60% with subtle inferobasal hypokinesia. Consider PCI for refractory CP.   CKD (chronic kidney disease), stage III ([place])    Hypertension    Migraines    "weekly" (01/06/2014)   Sickle cell trait ([place])    TIA (transient ischemic attack) 1978   Type II diabetes mellitus ([place])    Vertigo    Past Surgical History:  Procedure Laterality Date   APPENDECTOMY  March 2014   had appendix frozen   CARDIAC CATHETERIZATION  "years ago" & 01/05/2014   CARDIOVERSION N/A 03/17/2014   Procedure: CARDIOVERSION;  Surgeon: [name], MD;  Location: [place];  Service: Cardiovascular;  Laterality: N/A;   CARDIOVERSION N/A 01/14/2021   Procedure: CARDIOVERSION;  Surgeon: [name], MD;  Location: [facility] ENDOSCOPY;  Service: Cardiovascular;  Laterality: N/A;   CATARACT EXTRACTION W/ INTRAOCULAR LENS  IMPLANT, BILATERAL Bilateral 04/2013-05/2013   CORONARY  ATHERECTOMY N/A 08/05/2021   Procedure: CORONARY ATHERECTOMY;  Surgeon: Sherren Mocha, MD;  Location: [place] CV LAB;  Service: Cardiovascular;  Laterality: N/A;   CORONARY STENT INTERVENTION N/A 08/05/2021   Procedure: CORONARY STENT INTERVENTION;  Surgeon: Sherren Mocha, MD;  Location: [place] CV LAB;  Service: Cardiovascular;  Laterality: N/A;   ENDARTERECTOMY Right 02/05/2017   Procedure: ENDARTERECTOMY CAROTID-RIGHT;  Surgeon: [name], MD;  Location: [facility] OR;  Service: Vascular;  Laterality: Right;   ESOPHAGOGASTRODUODENOSCOPY  11/08/2007    Normal esophagus without evidence of Barrett, mass, erosion/ Normal stomach, duodenal bulb   ESOPHAGOGASTRODUODENOSCOPY (EGD) WITH PROPOFOL N/A 11/08/2020   Procedure: ESOPHAGOGASTRODUODENOSCOPY (EGD) WITH PROPOFOL;  Surgeon: Thornton Park, MD;  Location: [place];  Service: Gastroenterology;  Laterality: N/A;   GASTRIC MOTILITY STUDY  11/13/2007   mildly delayed emptying subjectively, but normal  analysis-77% of tracer emptied at 2 hours   INTRAVASCULAR PRESSURE WIRE/FFR STUDY N/A 08/04/2021   Procedure: INTRAVASCULAR PRESSURE WIRE/FFR STUDY;  Surgeon: Nelva Bush, MD;  Location: [place] CV LAB;  Service: Cardiovascular;  Laterality: N/A;   JOINT REPLACEMENT     LAPAROSCOPIC APPENDECTOMY N/A 09/15/2012   Procedure: APPENDECTOMY LAPAROSCOPIC;  Surgeon: [name], MD;  Location: [facility];  Service: General;  Laterality: N/A;   LEFT HEART CATH AND CORONARY ANGIOGRAPHY N/A 08/04/2021   Procedure: LEFT HEART CATH AND CORONARY ANGIOGRAPHY;  Surgeon: Nelva Bush, MD;  Location: [place] CV LAB;  Service: Cardiovascular;  Laterality: N/A;   LEFT HEART CATHETERIZATION WITH CORONARY  ANGIOGRAM N/A 01/05/2014   Procedure: LEFT HEART CATHETERIZATION WITH CORONARY ANGIOGRAM;  Surgeon: [name], MD;  Location: [facility] CATH LAB;  Service: Cardiovascular;  Laterality: N/A;   REPLACEMENT TOTAL KNEE Right 05-03-06   TEE WITHOUT  CARDIOVERSION N/A 03/17/2014   Procedure: TRANSESOPHAGEAL ECHOCARDIOGRAM (TEE);  Surgeon: [name], MD;  Location: [facility] ENDOSCOPY;  Service: Cardiovascular;  Laterality: N/A;   TONSILLECTOMY  1972    Allergies  Allergies  Allergen Reactions   Penicillins Hives    Has patient had a PCN reaction causing immediate rash, facial/tongue/throat swelling, SOB or lightheadedness with hypotension: no Has patient had a PCN reaction causing severe rash involving mucus membranes or skin necrosis: No  Has patient had a PCN reaction that required hospitalization: no Has patient had a PCN reaction occurring within the last 10 years: no If all of the above answers are "NO", then may proceed with Cephalosporin use.     History of Present Illness    Cassandra Adams has a PMH of essential hypertension, bilateral carotid stenosis, coronary artery disease, paroxysmal atrial fibrillation, TIA, type 2 diabetes, HTN, HLD, CKD stage III, sickle cell trait, and NSTEMI.   She was admitted to the emergency department for observation on 08/02/2021 and discharged on 08/07/2021.  She reported left-sided chest pain and shortness of breath.  She was given 1 L of saline.  Her blood pressure was noted to be 84/48.  Her troponins increased from 36-127.  She was noted to have T wave inversions.  Her chest x-ray was unremarkable.  She was started on heparin drip and transferred to [facility].  She underwent cardiac catheterization on 08/04/2021 which showed chronic total occlusion of her RCA with collaterals and 70% proximal/mid left anterior descending.  On 08/05/2021 she underwent arthrectomy and stenting of her LAD.  She was placed on clopidogrel and her apixaban was continued.  She presents to the clinic today for follow-up evaluation states she feels well.  She has been noticing occasional episodes of dizziness.  We reviewed the importance of allowing 1-2 minutes prior to moving after standing.  We reviewed her  current medications.  She is also working with a physician from [place] who prescribes her Delene Loll.  Her blood pressure is well controlled at home.  We reviewed the importance of heart healthy low-sodium high-fiber diet and lowering her cholesterol.  Her and her daughter expressed understanding.  I will continue her current medication regimen.  She denies side effects.  We will plan follow-up in 3 to 4 months.  We will also repeat her fasting lipids and LFTs in 4 to 6 weeks.  Today she denies chest pain, shortness of breath, lower extremity edema, fatigue, palpitations, melena, hematuria, hemoptysis, diaphoresis, weakness, presyncope, syncope, orthopnea, and PND.   Home Medications    Prior to Admission medications   Medication Sig Start Date End Date Taking? Authorizing Provider  amLODipine (NORVASC) 5 MG tablet Take 1 tablet (5 mg total) by mouth daily. 08/08/21 11/06/21  Terrilee Croak, MD  atorvastatin (LIPITOR) 80 MG tablet Take 1 tablet (80 mg total) by mouth every evening. 12/21/20   Hilty, Nadean Corwin, MD  BD INSULIN SYRINGE U/F 31G X 5/16" 0.5 ML MISC Use to take with Novolog [facility] 01/07/21   Cassandria Anger, MD  clopidogrel (PLAVIX) 75 MG tablet Take 1 tablet (75 mg total) by mouth daily with breakfast. 08/08/21 11/06/21  Dahal, Marlowe Aschoff, MD  colesevelam (WELCHOL) 625 MG tablet Take 1,875 mg by mouth 2 (  two) times daily with a meal. Noon and night    [provider]  Continuous Blood Gluc Receiver (FREESTYLE LIBRE 2 READER) DEVI 1 each by Does not apply route daily in the afternoon. 06/15/21   Amin, Jeanella Flattery, MD  Continuous Blood Gluc Sensor (FREESTYLE LIBRE 2 SENSOR) MISC 1 each by Does not apply route daily in the afternoon. 06/15/21   Amin, Ankit Chirag, MD  ELIQUIS 5 MG TABS tablet Take 1 tablet (5 mg total) by mouth 2 (two) times daily. 11/07/20   Murlean Iba, MD  furosemide (LASIX) 40 MG tablet Take 40 mg  one tablet by mouth daily , may take an additional 40 mg if  needed daily if leg edema worsens 01/10/21   Almyra Deforest, PA  hydrALAZINE (APRESOLINE) 50 MG tablet Take 1 tablet (50 mg total) by mouth 3 (three) times daily. Patient taking differently: Take 25 mg by mouth 3 (three) times daily. 03/15/21   Almyra Deforest, PA  Insulin Glargine (BASAGLAR KWIKPEN) 100 UNIT/ML Inject 10 Units into the skin at bedtime. 08/07/21   Dahal, Marlowe Aschoff, MD  JARDIANCE 10 MG TABS tablet Take 10 mg by mouth daily. 07/02/21   [provider]  nitroGLYCERIN (NITROSTAT) 0.4 MG SL tablet PLACE 1 TABLET UNDER THE TONGUE EVERY 5 MINUTES AS NEEDED FOR CHEST PAIN (UP TO 3 DOSES) Patient taking differently: Place 0.4 mg under the tongue every 5 (five) minutes as needed. 03/25/21   Hilty, Nadean Corwin, MD  NOVOLOG 100 UNIT/ML injection Inject 6-9 Units into the skin 3 (three) times daily with meals. Sliding Scale 07/28/20   [provider]  polyethylene glycol (MIRALAX / GLYCOLAX) 17 g packet Take 17 g by mouth daily. 11/12/20   Dessa Phi, DO  Vitamin D, Cholecalciferol, 25 MCG (1000 UT) TABS Take 2,000 Units by mouth daily.    [provider]    Family History    Family History  Problem Relation Age of Onset   Hyperlipidemia Mother    Hypertension Mother    Heart attack Mother    Cancer Father        lung   Hypertension Sister    Diabetes Brother    Heart disease Brother    Hyperlipidemia Brother    Hypertension Brother    Heart attack Brother    Other Brother        DVT   She indicated that her mother is deceased. She indicated that her father is deceased. She indicated that her sister is alive. She indicated that her brother is alive.  Social History    Social History   Socioeconomic History   Marital status: Single    Spouse name: Not on file   Number of children: Not on file   Years of education: Not on file   Highest education level: Not on file  Occupational History   Not on file  Tobacco Use   Smoking status: Never   Smokeless tobacco: Never   Vaping Use   Vaping Use: Never used  Substance and Sexual Activity   Alcohol use: No   Drug use: No   Sexual activity: Not Currently    Birth control/protection: Post-menopausal  Other Topics Concern   Not on file  Social History Narrative   Not on file   Social Determinants of Health   Financial Resource Strain: Not on file  Food Insecurity: Not on file  Transportation Needs: Not on file  Physical Activity: Not on file  Stress: Not on  file  Social Connections: Not on file  Intimate Partner Violence: Not on file     Review of Systems    General:  No chills, fever, night sweats or weight changes.  Cardiovascular:  No chest pain, dyspnea on exertion, edema, orthopnea, palpitations, paroxysmal nocturnal dyspnea. Dermatological: No rash, lesions/masses Respiratory: No cough, dyspnea Urologic: No hematuria, dysuria Abdominal:   No nausea, vomiting, diarrhea, bright red blood per rectum, melena, or hematemesis Neurologic:  No visual changes, wkns, changes in mental status. All other systems reviewed and are otherwise negative except as noted above.  Physical Exam    VS:  BP 110/66    Ht 5\' 6"  (1.676 m)    Wt 162 lb 9.6 oz (73.8 kg)    SpO2 98%    BMI 26.24 kg/m  , BMI Body mass index is 26.24 kg/m. GEN: Well nourished, well developed, in no acute distress. HEENT: normal. Neck: Supple, no JVD, carotid bruits, or masses. Cardiac: RRR, no murmurs, rubs, or gallops. No clubbing, cyanosis, edema.  Radials/DP/PT 2+ and equal bilaterally.  Respiratory:  Respirations regular and unlabored, clear to auscultation bilaterally. GI: Soft, nontender, nondistended, BS + x 4. MS: no deformity or atrophy. Skin: warm and dry, no rash. Neuro:  Strength and sensation are intact. Psych: Normal affect.  Accessory Clinical Findings    Recent Labs: 01/16/2021: Magnesium 2.0 06/11/2021: TSH 0.500 08/02/2021: ALT 18; B Natriuretic Peptide 95.0 08/07/2021: Hemoglobin 9.7; Platelets  127 08/11/2021: BUN 28; Creatinine, Ser 1.42; Potassium 4.3; Sodium 145   Recent Lipid Panel    Component Value Date/Time   CHOL 144 01/16/2021 0933   CHOL 128 06/09/2020 0802   TRIG 61 01/16/2021 0933   HDL 69 01/16/2021 0933   HDL 61 06/09/2020 0802   CHOLHDL 2.1 01/16/2021 0933   VLDL 12 01/16/2021 0933   LDLCALC 63 01/16/2021 0933   LDLCALC 52 06/09/2020 0802    ECG personally reviewed by me today-normal sinus rhythm inferior infarct undetermined age lateral injury pattern 89 bpm-EKG changes discussed with Dr. Martinique.  Patient asymptomatic at this time.  Echocardiogram 08/04/2021 IMPRESSIONS     1. Left ventricular ejection fraction, by estimation, is 60 to 65%. The  left ventricle has normal function. The left ventricle demonstrates  regional wall motion abnormalities (see scoring diagram/findings for  description). There is severe asymmetric  left ventricular hypertrophy. Left ventricular diastolic parameters are  consistent with Grade III diastolic dysfunction (restrictive). Elevated  left ventricular end-diastolic pressure. There is mild hypokinesis of the  left ventricular, basal inferior  wall.   2. Right ventricular systolic function is normal. The right ventricular  size is normal. There is normal pulmonary artery systolic pressure.   3. Left atrial size was severely dilated.   4. The mitral valve is normal in structure. Trivial mitral valve  regurgitation. No evidence of mitral stenosis.   5. The aortic valve is tricuspid. There is mild calcification of the  aortic valve. There is mild thickening of the aortic valve. Aortic valve  regurgitation is not visualized. Aortic valve sclerosis/calcification is  present, without any evidence of  aortic stenosis.   6. The inferior vena cava is normal in size with <50% respiratory  variability, suggesting right atrial pressure of 8 mmHg.   Comparison(s): No significant change from prior study.   Cardiac catheterization  08/04/2021 Conclusions: Severe two-vessel coronary artery disease, including 70% proximal/mid LAD stenosis with heavy calcification that is significant by RFR (0.71), and chronic total occlusion of mid RCA. Normal  left ventricular filling pressure. Calcified aorta consistent with "porcelain aorta."   Recommendations: Staged PCI/atherectomy of proximal/mid LAD if renal function remains stable. Restart IV heparin 2 hours after TR band removal.  Will load with clopidogrel today in anticipation of PCI tomorrow. Obtain echocardiogram to ensure LVEF is stable. Aggressive secondary prevention of coronary artery disease.   Nelva Bush, MD [facility]  Diagnostic Dominance: Right Intervention   Cardiac catheterization 08/05/2021    Prox LAD to Mid LAD lesion is 70% stenosed.   Dist Cx lesion is 25% stenosed.   Prox RCA lesion is 90% stenosed.   Mid RCA lesion is 100% stenosed.   1st Diag lesion is 75% stenosed.   A drug-eluting stent was successfully placed using a STENT ONYX FRONTIER 3.0X30.   Post intervention, there is a 0% residual stenosis.   Successful orbital atherectomy and stenting of the proximal LAD using a CSI 1.25 mm classic crown device for atherectomy and a 3.0 x 30 mm Onyx frontier drug-eluting stent    Recommend: Resume apixaban 5 mg twice daily tomorrow morning Continue clopidogrel 75 mg daily x6 months Avoid aspirin until clopidogrel is discontinued (high bleeding risk in a 76 year old diabetic woman on triple therapy) Diagnostic Dominance: Right Intervention    Assessment & Plan   1.  NSTEMI-no episodes of arm neck back or chest discomfort since being discharged from the hospital.  Underwent cardiac catheterization on 08/04/2021 and had arthrectomy with DES to her mid LAD on 08/05/2021.  Details above. Continue clopidogrel, apixaban, atorvastatin, Entresto, nitroglycerin Heart healthy low-sodium diet-salty 6 given Increase physical activity as  tolerated  Hyperlipidemia-01/16/2021: Cholesterol 144; HDL 69; LDL Cholesterol 63; Triglycerides 61; VLDL 12 Continue atorvastatin, Welchol Heart healthy low-sodium high-fiber diet Increase physical activity as tolerated Repeat fasting lipids and LFTs in 4-6 weeks  Essential hypertension-BP today 110/66.  Well-controlled at home. Continue Entresto Heart healthy low-sodium diet-salty 6 given Increase physical activity as tolerated  Paroxysmal atrial fibrillation-EKG today shows normal sinus rhythm inferior infarct undetermined age lateral injury pattern 67 bpm.  EKG reviewed with Dr. Martinique.  Patient asymptomatic.  No recent episodes of increased or irregular heartbeat. Continue apixaban Heart healthy low-sodium diet Increase physical activity as tolerated Avoid triggers caffeine, chocolate, EtOH, dehydration etc.  Type 2 diabetes-glucose 212 on 08/11/2021. Continue Jardiance, insulin Heart healthy low-sodium carb modified diet Increase physical activity as tolerated Follows with PCP   Disposition: Follow-up with Dr. Debara Pickett in 3-4 months.  Jossie Ng. Dalessandro Baldyga NP-C    08/16/2021, 4:09 PM [facility] [place] [address] Office [phone] Fax [phone]  Notice: This dictation was prepared with Dragon dictation along with smaller phrase technology. Any transcriptional errors that result from this process are unintentional and may not be corrected upon review.  I spent 14 minutes examining this patient, reviewing medications, and using patient centered shared decision making involving her cardiac care.  Prior to her visit I spent greater than 20 minutes reviewing her past medical history,  medications, and prior cardiac tests.

## 2021-08-16 ENCOUNTER — Other Ambulatory Visit (HOSPITAL_COMMUNITY): Payer: Self-pay | Admitting: Internal Medicine

## 2021-08-16 ENCOUNTER — Ambulatory Visit: Payer: Medicare HMO | Admitting: General Practice

## 2021-08-16 ENCOUNTER — Other Ambulatory Visit: Payer: Self-pay

## 2021-08-16 ENCOUNTER — Encounter: Payer: Self-pay | Admitting: General Practice

## 2021-08-16 VITALS — BP 110/66 | Ht 66.0 in | Wt 162.6 lb

## 2021-08-16 DIAGNOSIS — I10 Essential (primary) hypertension: Secondary | ICD-10-CM | POA: Diagnosis not present

## 2021-08-16 DIAGNOSIS — E785 Hyperlipidemia, unspecified: Secondary | ICD-10-CM | POA: Diagnosis not present

## 2021-08-16 DIAGNOSIS — I4819 Other persistent atrial fibrillation: Secondary | ICD-10-CM

## 2021-08-16 DIAGNOSIS — Z9889 Other specified postprocedural states: Secondary | ICD-10-CM

## 2021-08-16 NOTE — Patient Instructions (Signed)
Medication Instructions:  The current medical regimen is effective;  continue present plan and medications as directed. Please refer to the Current Medication list given to you today.  *If you need a refill on your cardiac medications before your next appointment, please call your pharmacy*  Lab Work: FASTING LIPID AND LFT IN 4-6 WEEKS If you have labs (blood work) drawn today and your tests are completely normal, you will receive your results only by:  [place] (if you have MyChart) OR A paper copy in the mail.  If you have any lab test that is abnormal or we need to change your treatment, we will call you to review the results. You may go to any [facility] that is convenient for you however, we do have a lab in our office that is able to assist you. You DO NOT need an appointment for our lab. The lab is open 8:00am and closes at 4:00pm. Lunch 12:45 - 1:45pm.  Special Instructions PLEASE READ AND FOLLOW SALTY 6-ATTACHED-1,800 mg daily  PLEASE INCREASE PHYSICAL ACTIVITY AS TOLERATED  Follow-Up: Your next appointment:  6 month(s) In Person with [name], MD     Please call our office 2 months in advance to schedule this appointment 1  At [facility], you and your health needs are our priority.  As part of our continuing mission to provide you with exceptional heart care, we have created designated Provider Care Teams.  These Care Teams include your primary Cardiologist (physician) and Advanced Practice Providers (APPs -  Physician Assistants and Nurse Practitioners) who all work together to provide you with the care you need, when you need it.  We recommend signing up for the patient portal called "MyChart".  Sign up information is provided on this After Visit Summary.  MyChart is used to connect with patients for Virtual Visits (Telemedicine).  Patients are able to view lab/test results, encounter notes, upcoming appointments, etc.  Non-urgent messages can be sent to your provider  as well.   To learn more about what you can do with MyChart, go to [email or web address].

## 2021-09-09 ENCOUNTER — Other Ambulatory Visit: Payer: Self-pay | Admitting: Physician Assistant

## 2021-09-14 ENCOUNTER — Observation Stay (HOSPITAL_COMMUNITY)
Admission: EM | Admit: 2021-09-14 | Discharge: 2021-09-16 | Disposition: A | Discharge status: Home / Self Care | Payer: Medicare HMO | Attending: Internal Medicine | Admitting: Internal Medicine

## 2021-09-14 ENCOUNTER — Emergency Department (HOSPITAL_COMMUNITY): Payer: Medicare HMO

## 2021-09-14 ENCOUNTER — Encounter (HOSPITAL_COMMUNITY): Payer: Self-pay

## 2021-09-14 ENCOUNTER — Other Ambulatory Visit: Payer: Self-pay

## 2021-09-14 DIAGNOSIS — Z79899 Other long term (current) drug therapy: Secondary | ICD-10-CM | POA: Diagnosis not present

## 2021-09-14 DIAGNOSIS — E11621 Type 2 diabetes mellitus with foot ulcer: Secondary | ICD-10-CM

## 2021-09-14 DIAGNOSIS — R1084 Generalized abdominal pain: Secondary | ICD-10-CM | POA: Diagnosis not present

## 2021-09-14 DIAGNOSIS — R112 Nausea with vomiting, unspecified: Secondary | ICD-10-CM | POA: Diagnosis not present

## 2021-09-14 DIAGNOSIS — I10 Essential (primary) hypertension: Secondary | ICD-10-CM

## 2021-09-14 DIAGNOSIS — I2583 Coronary atherosclerosis due to lipid rich plaque: Secondary | ICD-10-CM | POA: Diagnosis not present

## 2021-09-14 DIAGNOSIS — I248 Other forms of acute ischemic heart disease: Secondary | ICD-10-CM

## 2021-09-14 DIAGNOSIS — I130 Hypertensive heart and chronic kidney disease with heart failure and stage 1 through stage 4 chronic kidney disease, or unspecified chronic kidney disease: Secondary | ICD-10-CM | POA: Insufficient documentation

## 2021-09-14 DIAGNOSIS — E1159 Type 2 diabetes mellitus with other circulatory complications: Secondary | ICD-10-CM

## 2021-09-14 DIAGNOSIS — I5032 Chronic diastolic (congestive) heart failure: Secondary | ICD-10-CM | POA: Diagnosis not present

## 2021-09-14 DIAGNOSIS — I2510 Atherosclerotic heart disease of native coronary artery without angina pectoris: Secondary | ICD-10-CM | POA: Diagnosis not present

## 2021-09-14 DIAGNOSIS — I4891 Unspecified atrial fibrillation: Principal | ICD-10-CM | POA: Diagnosis present

## 2021-09-14 DIAGNOSIS — R109 Unspecified abdominal pain: Secondary | ICD-10-CM | POA: Diagnosis not present

## 2021-09-14 DIAGNOSIS — Z8673 Personal history of transient ischemic attack (TIA), and cerebral infarction without residual deficits: Secondary | ICD-10-CM | POA: Insufficient documentation

## 2021-09-14 DIAGNOSIS — R1110 Vomiting, unspecified: Secondary | ICD-10-CM | POA: Diagnosis not present

## 2021-09-14 DIAGNOSIS — Z743 Need for continuous supervision: Secondary | ICD-10-CM | POA: Diagnosis not present

## 2021-09-14 DIAGNOSIS — R079 Chest pain, unspecified: Secondary | ICD-10-CM | POA: Diagnosis not present

## 2021-09-14 DIAGNOSIS — R000 Tachycardia, unspecified: Secondary | ICD-10-CM | POA: Diagnosis not present

## 2021-09-14 DIAGNOSIS — E1140 Type 2 diabetes mellitus with diabetic neuropathy, unspecified: Secondary | ICD-10-CM | POA: Diagnosis present

## 2021-09-14 DIAGNOSIS — Z7902 Long term (current) use of antithrombotics/antiplatelets: Secondary | ICD-10-CM | POA: Insufficient documentation

## 2021-09-14 DIAGNOSIS — E1122 Type 2 diabetes mellitus with diabetic chronic kidney disease: Secondary | ICD-10-CM | POA: Insufficient documentation

## 2021-09-14 DIAGNOSIS — R0902 Hypoxemia: Secondary | ICD-10-CM | POA: Diagnosis not present

## 2021-09-14 DIAGNOSIS — Z7984 Long term (current) use of oral hypoglycemic drugs: Secondary | ICD-10-CM | POA: Diagnosis not present

## 2021-09-14 DIAGNOSIS — N1832 Chronic kidney disease, stage 3b: Secondary | ICD-10-CM | POA: Diagnosis present

## 2021-09-14 DIAGNOSIS — I2489 Other forms of acute ischemic heart disease: Secondary | ICD-10-CM

## 2021-09-14 DIAGNOSIS — R778 Other specified abnormalities of plasma proteins: Secondary | ICD-10-CM | POA: Diagnosis present

## 2021-09-14 DIAGNOSIS — Z794 Long term (current) use of insulin: Secondary | ICD-10-CM | POA: Diagnosis not present

## 2021-09-14 DIAGNOSIS — I700 Atherosclerosis of aorta: Secondary | ICD-10-CM | POA: Diagnosis not present

## 2021-09-14 DIAGNOSIS — E1169 Type 2 diabetes mellitus with other specified complication: Secondary | ICD-10-CM | POA: Diagnosis present

## 2021-09-14 DIAGNOSIS — E782 Mixed hyperlipidemia: Secondary | ICD-10-CM

## 2021-09-14 DIAGNOSIS — R7989 Other specified abnormal findings of blood chemistry: Secondary | ICD-10-CM | POA: Diagnosis present

## 2021-09-14 DIAGNOSIS — R1010 Upper abdominal pain, unspecified: Secondary | ICD-10-CM | POA: Diagnosis present

## 2021-09-14 LAB — COMPREHENSIVE METABOLIC PANEL
ALT: 27 U/L (ref 0–44)
AST: 36 U/L (ref 15–41)
Albumin: 3.8 g/dL (ref 3.5–5.0)
Alkaline Phosphatase: 69 U/L (ref 38–126)
Anion gap: 11 (ref 5–15)
BUN: 30 mg/dL — ABNORMAL HIGH (ref 8–23)
CO2: 24 mmol/L (ref 22–32)
Calcium: 10.3 mg/dL (ref 8.9–10.3)
Chloride: 108 mmol/L (ref 98–111)
Creatinine, Ser: 1.57 mg/dL — ABNORMAL HIGH (ref 0.44–1.00)
GFR, Estimated: 34 mL/min — ABNORMAL LOW (ref >60)
Glucose, Bld: 226 mg/dL — ABNORMAL HIGH (ref 70–99)
Potassium: 4.7 mmol/L (ref 3.5–5.1)
Sodium: 143 mmol/L (ref 135–145)
Total Bilirubin: 0.7 mg/dL (ref 0.3–1.2)
Total Protein: 7.2 g/dL (ref 6.5–8.1)

## 2021-09-14 LAB — CBC WITH DIFFERENTIAL/PLATELET
Abs Immature Granulocytes: 0.01 10*3/uL (ref 0.00–0.07)
Basophils Absolute: 0 10*3/uL (ref 0.0–0.1)
Basophils Relative: 1 %
Eosinophils Absolute: 0 10*3/uL (ref 0.0–0.5)
Eosinophils Relative: 1 %
HCT: 38.8 % (ref 36.0–46.0)
Hemoglobin: 13.3 g/dL (ref 12.0–15.0)
Immature Granulocytes: 0 %
Lymphocytes Relative: 23 %
Lymphs Abs: 1.1 10*3/uL (ref 0.7–4.0)
MCH: 27.8 pg (ref 26.0–34.0)
MCHC: 34.3 g/dL (ref 30.0–36.0)
MCV: 81.2 fL (ref 80.0–100.0)
Monocytes Absolute: 0.3 10*3/uL (ref 0.1–1.0)
Monocytes Relative: 5 %
Neutro Abs: 3.4 10*3/uL (ref 1.7–7.7)
Neutrophils Relative %: 70 %
Platelets: 184 10*3/uL (ref 150–400)
RBC: 4.78 MIL/uL (ref 3.87–5.11)
RDW: 15.7 % — ABNORMAL HIGH (ref 11.5–15.5)
WBC: 4.8 10*3/uL (ref 4.0–10.5)
nRBC: 0 % (ref 0.0–0.2)

## 2021-09-14 LAB — LIPASE, BLOOD: Lipase: 25 U/L (ref 11–51)

## 2021-09-14 LAB — URINALYSIS, ROUTINE W REFLEX MICROSCOPIC
Bilirubin Urine: NEGATIVE
Glucose, UA: >=500 mg/dL — AB
Ketones, ur: NEGATIVE mg/dL
Leukocytes,Ua: NEGATIVE
Nitrite: NEGATIVE
Protein, ur: NEGATIVE mg/dL
Specific Gravity, Urine: 1.013 (ref 1.005–1.030)
pH: 5 (ref 5.0–8.0)

## 2021-09-14 LAB — TROPONIN I (HIGH SENSITIVITY)
Troponin I (High Sensitivity): 41 ng/L — ABNORMAL HIGH (ref <18)
Troponin I (High Sensitivity): 120 ng/L (ref <18)

## 2021-09-14 MED ORDER — ACETAMINOPHEN 325 MG PO TABS
650.0000 mg | ORAL_TABLET | Freq: Four times a day (QID) | ORAL | Status: DC | PRN
  Administered 2021-09-15: 650 mg via ORAL
  Filled 2021-09-14: qty 2

## 2021-09-14 MED ORDER — APIXABAN 5 MG PO TABS
5.0000 mg | ORAL_TABLET | Freq: Two times a day (BID) | ORAL | Status: DC
  Administered 2021-09-14: 5 mg via ORAL
  Filled 2021-09-14: qty 1

## 2021-09-14 MED ORDER — INSULIN ASPART 100 UNIT/ML IJ SOLN
0.0000 [IU] | Freq: Three times a day (TID) | INTRAMUSCULAR | Status: DC
  Administered 2021-09-15: 5 [IU] via SUBCUTANEOUS
  Administered 2021-09-15: 7 [IU] via SUBCUTANEOUS
  Administered 2021-09-15: 2 [IU] via SUBCUTANEOUS
  Administered 2021-09-16: 5 [IU] via SUBCUTANEOUS

## 2021-09-14 MED ORDER — ONDANSETRON HCL 4 MG PO TABS: 4.0000 mg | ORAL_TABLET | Freq: Four times a day (QID) | ORAL | Status: DC | PRN

## 2021-09-14 MED ORDER — INSULIN GLARGINE-YFGN 100 UNIT/ML ~~LOC~~ SOLN
10.0000 [IU] | Freq: Every day | SUBCUTANEOUS | Status: DC
  Administered 2021-09-14 – 2021-09-15 (×2): 10 [IU] via SUBCUTANEOUS
  Filled 2021-09-14 (×3): qty 0.1

## 2021-09-14 MED ORDER — ATORVASTATIN CALCIUM 40 MG PO TABS
80.0000 mg | ORAL_TABLET | Freq: Every evening | ORAL | Status: DC
  Administered 2021-09-15: 80 mg via ORAL
  Filled 2021-09-14: qty 2

## 2021-09-14 MED ORDER — ACETAMINOPHEN 650 MG RE SUPP: 650.0000 mg | Freq: Four times a day (QID) | RECTAL | Status: DC | PRN

## 2021-09-14 MED ORDER — ONDANSETRON HCL 4 MG/2ML IJ SOLN
4.0000 mg | Freq: Four times a day (QID) | INTRAMUSCULAR | Status: DC | PRN
Start: 2021-09-14 — End: 2021-09-16

## 2021-09-14 MED ORDER — ONDANSETRON HCL 4 MG/2ML IJ SOLN
4.0000 mg | Freq: Once | INTRAMUSCULAR | Status: DC
  Filled 2021-09-14: qty 2

## 2021-09-14 MED ORDER — POLYETHYLENE GLYCOL 3350 17 G PO PACK: 17.0000 g | PACK | Freq: Every day | ORAL | Status: DC | PRN

## 2021-09-14 MED ORDER — IOHEXOL 300 MG/ML  SOLN
100.0000 mL | Freq: Once | INTRAMUSCULAR | Status: AC | PRN
  Administered 2021-09-14: 80 mL via INTRAVENOUS

## 2021-09-14 MED ORDER — ENSURE ENLIVE PO LIQD
237.0000 mL | Freq: Two times a day (BID) | ORAL | Status: DC
  Administered 2021-09-15: 237 mL via ORAL

## 2021-09-14 MED ORDER — INSULIN ASPART 100 UNIT/ML IJ SOLN
0.0000 [IU] | Freq: Every day | INTRAMUSCULAR | Status: DC
  Administered 2021-09-15: 2 [IU] via SUBCUTANEOUS

## 2021-09-14 NOTE — ED Notes (Addendum)
Attempted IV stick x1, unsuccessful. Patient tolerated well. Patient states she has been stuck a total of three times (2 times by EMS) and would not like to be stuck again unless it is by the "team" who got it last time.  ?

## 2021-09-14 NOTE — ED Notes (Signed)
Patient received approximately 350m of Normal Saline while here.  ?

## 2021-09-14 NOTE — Assessment & Plan Note (Addendum)
Uncontrolled. ?06/11/2021 hemoglobin A1c 8.1 ?- SSI- S ?- Resume lantus 10u daily ( Home dose 20u) ?- Hold Jardiance ?

## 2021-09-14 NOTE — ED Triage Notes (Signed)
Patient via EMS due to abdominal pain and vomiting that started this morning.  ?

## 2021-09-14 NOTE — ED Notes (Addendum)
Attempted to give patient nausea medicine and patient refused stating she is not nauseous. Patient assisted to bedside commode to pee ?

## 2021-09-14 NOTE — Assessment & Plan Note (Addendum)
Patient has paroxysmal atrial fibrillation ?spontanesouly converted to sinus rhythm and remained in sinus ?Chronically not on any rate controlling medications secondary to SSS ?Cardiology consult appreciated ?Not on chronotropic meds due to bradycardia at baseline and SSS ?apixaban restarted ?

## 2021-09-14 NOTE — Assessment & Plan Note (Addendum)
Presented with hypotension. ?-initially holding Entresto, amlodipine, hydralazine ?-resume Entresto ?-resume hydralazine as BP much improved, no hypertensive again ?-amlodipine stopped ?-imdur started ? ?

## 2021-09-14 NOTE — ED Provider Notes (Signed)
?[facility] ?Provider Note ? ? ?CSN: [id number] ?Arrival date & time: 09/14/21  1216 ? ?  ? ?History ? ?Chief Complaint  ?Patient presents with  ? Abdominal Pain  ? ? ?Cassandra Adams is a 76 y.o. female. ? ? ?Abdominal Pain ?Associated symptoms: nausea and vomiting   ?Associated symptoms: no chest pain, no chills, no cough, no diarrhea, no dysuria, no fever and no shortness of breath   ? ?  ? ?Cassandra Adams is a 76 y.o. female past medical history significant for coronary artery disease, paroxysmal atrial fibrillation chronic kidney disease, hypertension, sickle cell trait, and diabetes who presents to the Emergency Department complaining of lower chest/upper abdominal pain with nausea and vomiting that began earlier this morning.  She states that she ate breakfast and was feeling well then suddenly developed nausea and vomited x2.  She described having diffuse pain across her upper abdomen and below both breasts.  Pain did not radiate into her back.  She denies any chest pain or shortness of breath.  She was admitted to the hospital and early February for chest pain and shortness of breath.  She was found to have elevated troponins and subsequently underwent cardiac catheterization and stenting of her LAD.  She was then placed on clopidogrel and her apixaban was continued.  She is not currently on any rate controlling medications.  She was previously taking Multaq but family noted that her heart rate became too low, the medication dose was cut in half and she continued to have heart rates that were too low so the medication was discontinued. ? ?Home Medications ?Prior to Admission medications   ?Medication Sig Start Date End Date Taking? Authorizing Provider  ?amLODipine (NORVASC) 5 MG tablet Take 1 tablet (5 mg total) by mouth daily. 08/08/21 11/06/21  Terrilee Croak, MD  ?atenolol (TENORMIN) 25 MG tablet Take 25 mg by mouth daily. 09/02/21   [provider]  ?atorvastatin  (LIPITOR) 80 MG tablet Take 1 tablet (80 mg total) by mouth every evening. 12/21/20   Hilty, Nadean Corwin, MD  ?B-D ULTRAFINE III SHORT PEN 31G X 8 MM MISC SMARTSIG:Injection Daily 07/06/21   [provider]  ?BD INSULIN SYRINGE U/F 31G X 5/16" 0.5 ML MISC Use to take with Novolog [facility] 01/07/21   Cassandria Anger, MD  ?clopidogrel (PLAVIX) 75 MG tablet Take 1 tablet (75 mg total) by mouth daily with breakfast. 08/08/21 11/06/21  Dahal, Marlowe Aschoff, MD  ?colesevelam (WELCHOL) 625 MG tablet Take 1,875 mg by mouth 2 (two) times daily with a meal. Noon and night    [provider]  ?Continuous Blood Gluc Receiver (FREESTYLE LIBRE 2 READER) DEVI 1 each by Does not apply route daily in the afternoon. 06/15/21   Amin, Jeanella Flattery, MD  ?Continuous Blood Gluc Sensor (FREESTYLE LIBRE 2 SENSOR) MISC 1 each by Does not apply route daily in the afternoon. 06/15/21   Amin, Jeanella Flattery, MD  ?ELIQUIS 5 MG TABS tablet Take 1 tablet (5 mg total) by mouth 2 (two) times daily. 11/07/20   Johnson, Clanford L, MD  ?ENTRESTO 24-26 MG Take 1 tablet by mouth 2 (two) times daily. 08/15/21   [provider]  ?furosemide (LASIX) 40 MG tablet Take 40 mg  one tablet by mouth daily , may take an additional 40 mg if needed daily if leg edema worsens 01/10/21   Almyra Deforest, PA  ?hydrALAZINE (APRESOLINE) 50 MG tablet Take 0.5 tablets (25 mg total) by mouth 3 (  three) times daily. 09/09/21   Hilty, Nadean Corwin, MD  ?Insulin Glargine (BASAGLAR KWIKPEN) 100 UNIT/ML Inject 10 Units into the skin at bedtime. ?Patient taking differently: Inject 20 Units into the skin at bedtime. 08/07/21   Terrilee Croak, MD  ?JARDIANCE 10 MG TABS tablet Take 10 mg by mouth daily. 07/02/21   [provider]  ?nitroGLYCERIN (NITROSTAT) 0.4 MG SL tablet PLACE 1 TABLET UNDER THE TONGUE EVERY 5 MINUTES AS NEEDED FOR CHEST PAIN (UP TO 3 DOSES) ?Patient taking differently: Place 0.4 mg under the tongue every 5 (five) minutes as needed. 03/25/21   Hilty, Nadean Corwin, MD  ?NOVOLOG 100 UNIT/ML injection Inject 6-9 Units into the skin 3 (three) times daily with meals. Sliding Scale 07/28/20   [provider]  ?NOVOLOG 100 UNIT/ML injection SMARTSIG:3-10 Unit(s) SUB-Q 08/18/21   [provider]  ?Donald Siva test strip TESTING 3 TIMES DAILY 06/20/21   [provider]  ?polyethylene glycol (MIRALAX / GLYCOLAX) 17 g packet Take 17 g by mouth daily. 11/12/20   Dessa Phi, DO  ?Vitamin D, Cholecalciferol, 25 MCG (1000 UT) TABS Take 2,000 Units by mouth daily.    [provider]  ?   ? ?Allergies    ?Penicillins   ? ?Review of Systems   ?Review of Systems  ?Constitutional:  Negative for appetite change, chills and fever.  ?Respiratory:  Negative for cough, chest tightness, shortness of breath and wheezing.   ?Cardiovascular:  Negative for chest pain and leg swelling.  ?Gastrointestinal:  Positive for abdominal pain, nausea and vomiting. Negative for diarrhea.  ?Genitourinary:  Negative for dysuria.  ?Musculoskeletal:  Negative for arthralgias.  ?Skin:  Negative for rash.  ?Neurological:  Negative for dizziness, syncope and weakness.  ?All other systems reviewed and are negative. ? ?Physical Exam ?Updated Vital Signs ?BP (!) 130/105   Pulse (!) 115   Temp 97.9 ?F (36.6 ?C) (Oral)   Resp (!) 25   Ht '5\' 6"'$  (1.676 m)   Wt 72.6 kg   SpO2 100%   BMI 25.82 kg/m?  ?Physical Exam ?Vitals and nursing note reviewed.  ?Constitutional:   ?   General: She is not in acute distress. ?   Appearance: She is well-developed. She is not ill-appearing.  ?Cardiovascular:  ?   Rate and Rhythm: Tachycardia present. Rhythm irregular.  ?   Pulses: Normal pulses.  ?Pulmonary:  ?   Effort: Pulmonary effort is normal.  ?   Breath sounds: Normal breath sounds.  ?Chest:  ?   Chest wall: No tenderness.  ?Abdominal:  ?   Palpations: Abdomen is soft.  ?   Tenderness: There is abdominal tenderness.  ?   Comments: Mild ttp along the entire upper abdomen.  No guarding or  rebound tenderness.  Abd is soft.  No guarding  ?Musculoskeletal:     ?   General: Normal range of motion.  ?   Cervical back: Normal range of motion.  ?Skin: ?   General: Skin is warm.  ?   Capillary Refill: Capillary refill takes less than 2 seconds.  ?Neurological:  ?   General: No focal deficit present.  ?   Mental Status: She is alert.  ? ? ?ED Results / Procedures / Treatments   ?Labs ?(all labs ordered are listed, but only abnormal results are displayed) ?Labs Reviewed  ?URINALYSIS, ROUTINE W REFLEX MICROSCOPIC - Abnormal; Notable for the following components:  ?    Result Value  ? Color, Urine STRAW (*)   ?  Glucose, UA >=500 (*)   ? Hgb urine dipstick SMALL (*)   ? Bacteria, UA RARE (*)   ? All other components within normal limits  ?CBC WITH DIFFERENTIAL/PLATELET - Abnormal; Notable for the following components:  ? RDW 15.7 (*)   ? All other components within normal limits  ?COMPREHENSIVE METABOLIC PANEL - Abnormal; Notable for the following components:  ? Glucose, Bld 226 (*)   ? BUN 30 (*)   ? Creatinine, Ser 1.57 (*)   ? GFR, Estimated 34 (*)   ? All other components within normal limits  ?TROPONIN I (HIGH SENSITIVITY) - Abnormal; Notable for the following components:  ? Troponin I (High Sensitivity) 41 (*)   ? All other components within normal limits  ?TROPONIN I (HIGH SENSITIVITY) - Abnormal; Notable for the following components:  ? Troponin I (High Sensitivity) 120 (*)   ? All other components within normal limits  ?LIPASE, BLOOD  ? ? ?EKG ?EKG Interpretation ? ?Date/Time:  Wednesday September 14 2021 12:29:08 EDT ?Ventricular Rate:  107 ?PR Interval:    ?QRS Duration: 89 ?QT Interval:  343 ?QTC Calculation: 458 ?R Axis:   2 ?Text Interpretation: Atrial fibrillation Borderline T abnormalities, diffuse leads HEART RATE INCREASED SINCE last tracing Confirmed by Davonna Belling [phone]) on 09/14/2021 5:27:28 PM ? ?Radiology ?CT ABDOMEN PELVIS W CONTRAST ? ?Result Date: 09/14/2021 ?CLINICAL DATA:  Nausea  vomiting and abdominal pain EXAM: CT ABDOMEN AND PELVIS WITH CONTRAST TECHNIQUE: Multidetector CT imaging of the abdomen and pelvis was performed using the standard protocol following bolus administration of intravenou

## 2021-09-14 NOTE — H&P (Signed)
?History and Physical  ? ? Cassandra Adams:[id number] DOB: 30-Mar-1946 DOA: 09/14/2021 ? ?PCP: Sharilyn Sites, MD  ? ?Patient coming from: Home ? ?I have personally briefly reviewed patient's old medical records in [facility] ? ?Chief Complaint: Abdominal Pain, Vomiting ? ?HPI: Cassandra Adams is a 76 y.o. female with medical history significant for CHF, CKD, Atria Fib on antioagulation, NSTEMI, DM, HTN, CAD recent stent placement. ?Patient presented to the ED with complaints of upper abdominal pain, beneath her bilateral breast.  Reports abdominal pain started this morning, intermittent.  She denies any chest pain, no difficulty breathing.  Reports 1 episode of vomiting today.  She denies palpitations.  Initial dizziness has resolved. ?Patient was recently admitted 2/17 to 2/12 for NSTEMI, stent placements, she presented with chest pain at that time.  The pain she is feeling today is different from the pain she had at that time.  She reports compliance with medications including Eliquis since discharge. ? ?ED Course: Temperature 97.9.  Heart rate tachycardic to 129.  Respiratory rate 13 to 24.  Blood pressure systolic initially 23/55, improved currently 101/60.  Troponin elevated at 41 > 120.  Portable chest x-ray, CT abdomen and pelvis with contrast without acute abnormality. ?Hospitalist to admit for A-fib with RVR, hypotension and elevated troponin. ? ?Review of Systems: As per HPI all other systems reviewed and negative. ? ?Past Medical History:  ?Diagnosis Date  ? (HFpEF) heart failure with preserved ejection fraction ([place])   ? Limited Echo 7/22: Mild asymmetrical LVH due to scar and thinning of basal posterior wall and background conc LVH, apical and mid segments hyperdynamic, EF 60-65, basal inf-lat and basal inf AK, normal RVSF, RVSP 26.6, mild LAE, mild-moderate MR, mild AV sclerosis w/o AS // Echo 5/22: EF 55-60, RVSP 27, mild to mod LAE, poss small to mod eff; AV sclerosis no AS  ?  Arthritis   ? "all over"  ? CAD (coronary artery disease)   ? a. NSTEMI 12/2013 - occluded dominant RCA with L-R collaterals, moderate prox segmental LAD disease, for medical therapy initially, EF 60% with subtle inferobasal hypokinesia. Consider PCI for refractory CP.  ? CKD (chronic kidney disease), stage III ([place])   ? Hypertension   ? Migraines   ? "weekly" (01/06/2014)  ? Sickle cell trait ([place])   ? TIA (transient ischemic attack) 1978  ? Type II diabetes mellitus ([place])   ? Vertigo   ? ? ?Past Surgical History:  ?Procedure Laterality Date  ? APPENDECTOMY  March 2014  ? had appendix frozen  ? CARDIAC CATHETERIZATION  "years ago" & 01/05/2014  ? CARDIOVERSION N/A 03/17/2014  ? Procedure: CARDIOVERSION;  Surgeon: [name], MD;  Location: [facility];  Service: Cardiovascular;  Laterality: N/A;  ? CARDIOVERSION N/A 01/14/2021  ? Procedure: CARDIOVERSION;  Surgeon: [name], MD;  Location: [facility] ENDOSCOPY;  Service: Cardiovascular;  Laterality: N/A;  ? CATARACT EXTRACTION W/ INTRAOCULAR LENS  IMPLANT, BILATERAL Bilateral 04/2013-05/2013  ? CORONARY ATHERECTOMY N/A 08/05/2021  ? Procedure: CORONARY ATHERECTOMY;  Surgeon: Sherren Mocha, MD;  Location: [place] CV LAB;  Service: Cardiovascular;  Laterality: N/A;  ? CORONARY STENT INTERVENTION N/A 08/05/2021  ? Procedure: CORONARY STENT INTERVENTION;  Surgeon: Sherren Mocha, MD;  Location: [facility] CV LAB;  Service: Cardiovascular;  Laterality: N/A;  ? ENDARTERECTOMY Right 02/05/2017  ? Procedure: ENDARTERECTOMY CAROTID-RIGHT;  Surgeon: [name], MD;  Location[facility] OR;  Service: Vascular;  Laterality: Right;  ? ESOPHAGOGASTRODUODENOSCOPY  11/08/2007  ?  Normal esophagus without evidence of Barrett, mass, erosion/ Normal stomach, duodenal bulb  ? ESOPHAGOGASTRODUODENOSCOPY (EGD) WITH PROPOFOL N/A 11/08/2020  ? Procedure: ESOPHAGOGASTRODUODENOSCOPY (EGD) WITH PROPOFOL;  Surgeon: Thornton Park, MD;  Location: [place];  Service:  Gastroenterology;  Laterality: N/A;  ? GASTRIC MOTILITY STUDY  11/13/2007  ? mildly delayed emptying subjectively, but normal  analysis-77% of tracer emptied at 2 hours  ? INTRAVASCULAR PRESSURE WIRE/FFR STUDY N/A 08/04/2021  ? Procedure: INTRAVASCULAR PRESSURE WIRE/FFR STUDY;  Surgeon: Nelva Bush, MD;  Location: [place] CV LAB;  Service: Cardiovascular;  Laterality: N/A;  ? JOINT REPLACEMENT    ? LAPAROSCOPIC APPENDECTOMY N/A 09/15/2012  ? Procedure: APPENDECTOMY LAPAROSCOPIC;  Surgeon: [name], MD;  Location: [facility];  Service: General;  Laterality: N/A;  ? LEFT HEART CATH AND CORONARY ANGIOGRAPHY N/A 08/04/2021  ? Procedure: LEFT HEART CATH AND CORONARY ANGIOGRAPHY;  Surgeon: Nelva Bush, MD;  Location: [place] CV LAB;  Service: Cardiovascular;  Laterality: N/A;  ? LEFT HEART CATHETERIZATION WITH CORONARY ANGIOGRAM N/A 01/05/2014  ? Procedure: LEFT HEART CATHETERIZATION WITH CORONARY ANGIOGRAM;  Surgeon: [name], MD;  Location: [facility] CATH LAB;  Service: Cardiovascular;  Laterality: N/A;  ? REPLACEMENT TOTAL KNEE Right 05-03-06  ? TEE WITHOUT CARDIOVERSION N/A 03/17/2014  ? Procedure: TRANSESOPHAGEAL ECHOCARDIOGRAM (TEE);  Surgeon: [name], MD;  Location: [facility] ENDOSCOPY;  Service: Cardiovascular;  Laterality: N/A;  ? TONSILLECTOMY  1972  ? ? ? reports that she has never smoked. She has never used smokeless tobacco. She reports that she does not drink alcohol and does not use drugs. ? ?Allergies  ?Allergen Reactions  ? Penicillins Hives  ?  Has patient had a PCN reaction causing immediate rash, facial/tongue/throat swelling, SOB or lightheadedness with hypotension: no ?Has patient had a PCN reaction causing severe rash involving mucus membranes or skin necrosis: No  ?Has patient had a PCN reaction that required hospitalization: no ?Has patient had a PCN reaction occurring within the last 10 years: no ?If all of the above answers are "NO", then may proceed with Cephalosporin use. ?   ? ? ?Family History  ?Problem Relation Age of Onset  ? Hyperlipidemia Mother   ? Hypertension Mother   ? Heart attack Mother   ? Cancer Father   ?     lung  ? Hypertension Sister   ? Diabetes Brother   ? Heart disease Brother   ? Hyperlipidemia Brother   ? Hypertension Brother   ? Heart attack Brother   ? Other Brother   ?     DVT  ? ?Prior to Admission medications   ?Medication Sig Start Date End Date Taking? Authorizing Provider  ?amLODipine (NORVASC) 5 MG tablet Take 1 tablet (5 mg total) by mouth daily. 08/08/21 11/06/21  Terrilee Croak, MD  ?atenolol (TENORMIN) 25 MG tablet Take 25 mg by mouth daily. 09/02/21   [provider]  ?atorvastatin (LIPITOR) 80 MG tablet Take 1 tablet (80 mg total) by mouth every evening. 12/21/20   Hilty, Nadean Corwin, MD  ?B-D ULTRAFINE III SHORT PEN 31G X 8 MM MISC SMARTSIG:Injection Daily 07/06/21   [provider]  ?BD INSULIN SYRINGE U/F 31G X 5/16" 0.5 ML MISC Use to take with Novolog [facility] 01/07/21   Cassandria Anger, MD  ?clopidogrel (PLAVIX) 75 MG tablet Take 1 tablet (75 mg total) by mouth daily with breakfast. 08/08/21 11/06/21  Dahal, Marlowe Aschoff, MD  ?colesevelam (WELCHOL) 625 MG tablet Take 1,875 mg by mouth 2 (two) times  daily with a meal. Noon and night    [provider]  ?Continuous Blood Gluc Receiver (FREESTYLE LIBRE 2 READER) DEVI 1 each by Does not apply route daily in the afternoon. 06/15/21   Amin, Jeanella Flattery, MD  ?Continuous Blood Gluc Sensor (FREESTYLE LIBRE 2 SENSOR) MISC 1 each by Does not apply route daily in the afternoon. 06/15/21   Amin, Jeanella Flattery, MD  ?ELIQUIS 5 MG TABS tablet Take 1 tablet (5 mg total) by mouth 2 (two) times daily. 11/07/20   Johnson, Clanford L, MD  ?ENTRESTO 24-26 MG Take 1 tablet by mouth 2 (two) times daily. 08/15/21   [provider]  ?furosemide (LASIX) 40 MG tablet Take 40 mg  one tablet by mouth daily , may take an additional 40 mg if needed daily if leg edema worsens 01/10/21   Almyra Deforest, PA   ?hydrALAZINE (APRESOLINE) 50 MG tablet Take 0.5 tablets (25 mg total) by mouth 3 (three) times daily. 09/09/21   Hilty, Nadean Corwin, MD  ?Insulin Glargine (BASAGLAR KWIKPEN) 100 UNIT/ML Inject 10 Units into the skin at bedtime. ?Pa

## 2021-09-14 NOTE — Assessment & Plan Note (Addendum)
Recently admitted to 08/02/21 to 08/07/21 with NSTEMI ?Discharged with apixaban and Plavix ?08/04/2021 cath--severe 2 v disease; LAD stent placed 08/05/21 ?IV heparin as discussed pending cardiology evaluation ?

## 2021-09-14 NOTE — Assessment & Plan Note (Addendum)
Baseline creatinine 1.3-1.5 

## 2021-09-14 NOTE — ED Notes (Signed)
Patient assisted to bedside commode.

## 2021-09-14 NOTE — Assessment & Plan Note (Addendum)
Clinically compensated ?Resume Entresto ?

## 2021-09-15 ENCOUNTER — Observation Stay (HOSPITAL_BASED_OUTPATIENT_CLINIC_OR_DEPARTMENT_OTHER): Payer: Medicare HMO

## 2021-09-15 ENCOUNTER — Other Ambulatory Visit (HOSPITAL_COMMUNITY): Payer: Self-pay | Admitting: *Deleted

## 2021-09-15 DIAGNOSIS — I5032 Chronic diastolic (congestive) heart failure: Secondary | ICD-10-CM | POA: Diagnosis not present

## 2021-09-15 DIAGNOSIS — E11621 Type 2 diabetes mellitus with foot ulcer: Secondary | ICD-10-CM

## 2021-09-15 DIAGNOSIS — E1159 Type 2 diabetes mellitus with other circulatory complications: Secondary | ICD-10-CM | POA: Diagnosis not present

## 2021-09-15 DIAGNOSIS — E782 Mixed hyperlipidemia: Secondary | ICD-10-CM

## 2021-09-15 DIAGNOSIS — R778 Other specified abnormalities of plasma proteins: Secondary | ICD-10-CM | POA: Diagnosis not present

## 2021-09-15 DIAGNOSIS — Z8673 Personal history of transient ischemic attack (TIA), and cerebral infarction without residual deficits: Secondary | ICD-10-CM | POA: Diagnosis not present

## 2021-09-15 DIAGNOSIS — N1832 Chronic kidney disease, stage 3b: Secondary | ICD-10-CM | POA: Diagnosis not present

## 2021-09-15 DIAGNOSIS — I4891 Unspecified atrial fibrillation: Secondary | ICD-10-CM | POA: Diagnosis not present

## 2021-09-15 DIAGNOSIS — Z79899 Other long term (current) drug therapy: Secondary | ICD-10-CM | POA: Diagnosis not present

## 2021-09-15 DIAGNOSIS — Z7902 Long term (current) use of antithrombotics/antiplatelets: Secondary | ICD-10-CM | POA: Diagnosis not present

## 2021-09-15 DIAGNOSIS — Z7984 Long term (current) use of oral hypoglycemic drugs: Secondary | ICD-10-CM | POA: Diagnosis not present

## 2021-09-15 DIAGNOSIS — I2510 Atherosclerotic heart disease of native coronary artery without angina pectoris: Secondary | ICD-10-CM | POA: Diagnosis not present

## 2021-09-15 DIAGNOSIS — Z794 Long term (current) use of insulin: Secondary | ICD-10-CM | POA: Diagnosis not present

## 2021-09-15 DIAGNOSIS — E1122 Type 2 diabetes mellitus with diabetic chronic kidney disease: Secondary | ICD-10-CM | POA: Diagnosis not present

## 2021-09-15 DIAGNOSIS — I130 Hypertensive heart and chronic kidney disease with heart failure and stage 1 through stage 4 chronic kidney disease, or unspecified chronic kidney disease: Secondary | ICD-10-CM | POA: Diagnosis not present

## 2021-09-15 DIAGNOSIS — I10 Essential (primary) hypertension: Secondary | ICD-10-CM | POA: Diagnosis not present

## 2021-09-15 LAB — CBC
HCT: 32.3 % — ABNORMAL LOW (ref 36.0–46.0)
Hemoglobin: 11.3 g/dL — ABNORMAL LOW (ref 12.0–15.0)
MCH: 28 pg (ref 26.0–34.0)
MCHC: 35 g/dL (ref 30.0–36.0)
MCV: 80 fL (ref 80.0–100.0)
Platelets: 158 10*3/uL (ref 150–400)
RBC: 4.04 MIL/uL (ref 3.87–5.11)
RDW: 15.5 % (ref 11.5–15.5)
WBC: 3.9 10*3/uL — ABNORMAL LOW (ref 4.0–10.5)
nRBC: 0 % (ref 0.0–0.2)

## 2021-09-15 LAB — HEPARIN LEVEL (UNFRACTIONATED): Heparin Unfractionated: >1.1 IU/mL — ABNORMAL HIGH (ref 0.30–0.70)

## 2021-09-15 LAB — GLUCOSE, CAPILLARY
Glucose-Capillary: 171 mg/dL — ABNORMAL HIGH (ref 70–99)
Glucose-Capillary: 166 mg/dL — ABNORMAL HIGH (ref 70–99)
Glucose-Capillary: 326 mg/dL — ABNORMAL HIGH (ref 70–99)
Glucose-Capillary: 282 mg/dL — ABNORMAL HIGH (ref 70–99)
Glucose-Capillary: 212 mg/dL — ABNORMAL HIGH (ref 70–99)

## 2021-09-15 LAB — BASIC METABOLIC PANEL
Anion gap: 9 (ref 5–15)
BUN: 31 mg/dL — ABNORMAL HIGH (ref 8–23)
CO2: 25 mmol/L (ref 22–32)
Calcium: 9.4 mg/dL (ref 8.9–10.3)
Chloride: 107 mmol/L (ref 98–111)
Creatinine, Ser: 1.57 mg/dL — ABNORMAL HIGH (ref 0.44–1.00)
GFR, Estimated: 34 mL/min — ABNORMAL LOW (ref >60)
Glucose, Bld: 222 mg/dL — ABNORMAL HIGH (ref 70–99)
Potassium: 4.1 mmol/L (ref 3.5–5.1)
Sodium: 141 mmol/L (ref 135–145)

## 2021-09-15 LAB — ECHOCARDIOGRAM LIMITED
Height: 66 in
S' Lateral: 3.55 cm
Weight: 2529.12 oz

## 2021-09-15 LAB — TROPONIN I (HIGH SENSITIVITY)
Troponin I (High Sensitivity): 325 ng/L (ref <18)
Troponin I (High Sensitivity): 305 ng/L (ref <18)

## 2021-09-15 LAB — APTT: aPTT: 35 seconds (ref 24–36)

## 2021-09-15 MED ORDER — HEPARIN (PORCINE) 25000 UT/250ML-% IV SOLN
850.0000 [IU]/h | INTRAVENOUS | Status: DC
  Administered 2021-09-15: 850 [IU]/h via INTRAVENOUS
  Filled 2021-09-15: qty 250

## 2021-09-15 MED ORDER — APIXABAN 5 MG PO TABS
5.0000 mg | ORAL_TABLET | Freq: Two times a day (BID) | ORAL | Status: DC
  Administered 2021-09-15 – 2021-09-16 (×3): 5 mg via ORAL
  Filled 2021-09-15 (×3): qty 1

## 2021-09-15 MED ORDER — GLUCERNA SHAKE PO LIQD
237.0000 mL | Freq: Two times a day (BID) | ORAL | Status: DC
Start: 2021-09-16 — End: 2021-09-16

## 2021-09-15 MED ORDER — HEPARIN BOLUS VIA INFUSION
4000.0000 [IU] | Freq: Once | INTRAVENOUS | Status: AC
Start: 2021-09-15 — End: 2021-09-15
  Administered 2021-09-15: 4000 [IU] via INTRAVENOUS
  Filled 2021-09-15: qty 4000

## 2021-09-15 MED ORDER — CLOPIDOGREL BISULFATE 75 MG PO TABS
75.0000 mg | ORAL_TABLET | Freq: Every day | ORAL | Status: DC
  Administered 2021-09-15 – 2021-09-16 (×2): 75 mg via ORAL
  Filled 2021-09-15 (×2): qty 1

## 2021-09-15 MED ORDER — ISOSORBIDE MONONITRATE ER 60 MG PO TB24
30.0000 mg | ORAL_TABLET | Freq: Every day | ORAL | Status: DC
  Administered 2021-09-15 – 2021-09-16 (×2): 30 mg via ORAL
  Filled 2021-09-15 (×2): qty 1

## 2021-09-15 MED ORDER — SACUBITRIL-VALSARTAN 24-26 MG PO TABS
1.0000 | ORAL_TABLET | Freq: Two times a day (BID) | ORAL | Status: DC
  Administered 2021-09-15 – 2021-09-16 (×3): 1 via ORAL
  Filled 2021-09-15 (×3): qty 1

## 2021-09-15 MED ORDER — GLUCERNA SHAKE PO LIQD: 237.0000 mL | Freq: Three times a day (TID) | ORAL | Status: DC

## 2021-09-15 NOTE — Assessment & Plan Note (Addendum)
Troponins 41>> 120>> 326 ?Given the patient's presentation with chest pain and increased delta troponin start empiric IV heparin ?Cardiology consult>>given unchanged echo and presentation, felt this was more due to demand ischemia rather than ACS ?Currently chest pain-free ?Limited echocardiogram EF 55-60, unchanged basal septal HK, no new WMA ?Stopped IV heparin>>restart apixaban and plavix ?

## 2021-09-15 NOTE — Hospital Course (Addendum)
76 year old female with a history of CAD/NSTEMI (Feb 2023), paroxysmal atrial fibrillation, CKD stage III, diabetes mellitus type 2, hypertension, hyperlipidemia, and sick sinus syndrome presenting with bilateral lower chest pain and upper abdominal pain that began on the morning of 09/14/2021 when she was sitting to eat breakfast.  The patient denied any shortness of breath, but subsequently developed nausea and vomiting x2.  She denied any fevers, chills, headache, neck pain, diarrhea, dysuria, hematuria.  She denies any new medications.  Notably, the patient was recently admitted to the hospital from 08/02/2021 to 08/07/2021 when she was treated for NSTEMI.  She underwent heart catheterization on 08/04/2021 which showed severe two-vessel disease (CTO of RCA with collaterals and 70% proximal/mid LAD).  On 08/05/2021 the patient underwent arthrectomy and stenting of the LAD.  She was discharged home with apixaban at instructed to continue Plavix for 6 months.  She endorses compliance with all her medications.  She had been in her usual state of health until she developed the upper abdominal pain/lower chest pain. ?In the ED, the patient was afebrile and initially hypotensive with a blood pressure of 82/48.  She was noted to have atrial fibrillation with RVR with HR 120-130 the patient was given a bolus normal saline with improvement of her heart rate and blood pressure.  BMP showed a sodium 143, potassium 4.7, serum creatinine 1.57.  WBC 4.8, hemoglobin 13.3, platelets 184,000.  UA was negative for pyuria.  LFTs and lipase were unremarkable.  Troponin 41>> 120.  EKG showed atrial fibrillation with nonspecific ST-T wave changes.  CT of the abdomen and pelvis was negative for any acute findings.  Chest x-ray showed bibasilar atelectasis.  Cardiology was consulted to assist with management.  They felt her elevated troponins were due to demand ischemia rather than ACS.  IV heparin was stopped and patient restarted back on  apixaban and plavix.  She sponataneously converted back to sinus and remained in sinus for the rest of the hospitalization. ?

## 2021-09-15 NOTE — Consult Note (Addendum)
? ?CONSULTATION NOTE  ? ?Patient Name: Cassandra Adams ?Date of Encounter: 09/15/2021 ?Cardiologist: [name], MD ?Electrophysiologist: None ?Advanced Heart Failure: None ? ? ?Chief Complaint  ? ?Abdominal pain ? ?Patient Profile  ? ?76 yo female with history of CAD and recent PCI, presented with abdominal pain and hypotension and was in afib with RVR, cardiology asked to see ? ?HPI  ? ?Cassandra Adams is a 76 y.o. female who is being seen today for the evaluation of abdominal pain at the request of Dr. Carles Collet. This is a 76 year old female patient of mine, that I last saw in January 2023.  She was subsequently admitted to the hospital with chest pain and had NSTEMI in February 2023.  At the time she was hypotensive and had mild troponin increased up to 127 along with some T wave inversions.  She was started on heparin and was found to have chronic total occlusion of the RCA as well as 70% proximal to mid LAD stenosis.  She underwent atherectomy and stenting of her LAD on 08/05/2021.  At discharge she was sent out on Plavix and Eliquis.  She was seen in the office in follow-up in February and was feeling well.  She denies any chest pain or worsening shortness of breath.  She is also been seeing a cardiologist out of [place] who has been prescribing Entresto.  I received some information from them-so called [facility]".  They seem to be located in [place] in [place]. ? ?She is now admitted to [facility] with upper abdominal/lower chest pain.  She had some nausea and vomited twice and was hypotensive and noted to have again minimally elevated troponin up to 120.  She was also in A-fib with RVR at the time.  Troponin further trended up to 325 but is lower than previously at over thousand.  She has been started on IV heparin.  Cardiology is asked to evaluate.  Of note, she converted back to sinus rhythm and has maintained that overnight.  Today she reports feeling well and denies any chest  pain or shortness of breath.  She has had no further vomiting. ? ?PMHx  ? ?Past Medical History:  ?Diagnosis Date  ? (HFpEF) heart failure with preserved ejection fraction ([place])   ? Limited Echo 7/22: Mild asymmetrical LVH due to scar and thinning of basal posterior wall and background conc LVH, apical and mid segments hyperdynamic, EF 60-65, basal inf-lat and basal inf AK, normal RVSF, RVSP 26.6, mild LAE, mild-moderate MR, mild AV sclerosis w/o AS // Echo 5/22: EF 55-60, RVSP 27, mild to mod LAE, poss small to mod eff; AV sclerosis no AS  ? Arthritis   ? "all over"  ? CAD (coronary artery disease)   ? a. NSTEMI 12/2013 - occluded dominant RCA with L-R collaterals, moderate prox segmental LAD disease, for medical therapy initially, EF 60% with subtle inferobasal hypokinesia. Consider PCI for refractory CP.  ? CKD (chronic kidney disease), stage III ([place])   ? Hypertension   ? Migraines   ? "weekly" (01/06/2014)  ? Sickle cell trait ([place])   ? TIA (transient ischemic attack) 1978  ? Type II diabetes mellitus ([place])   ? Vertigo   ? ? ?Past Surgical History:  ?Procedure Laterality Date  ? APPENDECTOMY  March 2014  ? had appendix frozen  ? CARDIAC CATHETERIZATION  "years ago" & 01/05/2014  ? CARDIOVERSION N/A 03/17/2014  ? Procedure: CARDIOVERSION;  Surgeon: [name], MD;  Location: [facility]  ENDOSCOPY;  Service: Cardiovascular;  Laterality: N/A;  ? CARDIOVERSION N/A 01/14/2021  ? Procedure: CARDIOVERSION;  Surgeon: [name], MD;  Location: [facility] ENDOSCOPY;  Service: Cardiovascular;  Laterality: N/A;  ? CATARACT EXTRACTION W/ INTRAOCULAR LENS  IMPLANT, BILATERAL Bilateral 04/2013-05/2013  ? CORONARY ATHERECTOMY N/A 08/05/2021  ? Procedure: CORONARY ATHERECTOMY;  Surgeon: Sherren Mocha, MD;  Location: [place] CV LAB;  Service: Cardiovascular;  Laterality: N/A;  ? CORONARY STENT INTERVENTION N/A 08/05/2021  ? Procedure: CORONARY STENT INTERVENTION;  Surgeon: Sherren Mocha, MD;  Location: [place] CV LAB;  Service:  Cardiovascular;  Laterality: N/A;  ? ENDARTERECTOMY Right 02/05/2017  ? Procedure: ENDARTERECTOMY CAROTID-RIGHT;  Surgeon: [name], MD;  Location: [facility] OR;  Service: Vascular;  Laterality: Right;  ? ESOPHAGOGASTRODUODENOSCOPY  11/08/2007  ?  Normal esophagus without evidence of Barrett, mass, erosion/ Normal stomach, duodenal bulb  ? ESOPHAGOGASTRODUODENOSCOPY (EGD) WITH PROPOFOL N/A 11/08/2020  ? Procedure: ESOPHAGOGASTRODUODENOSCOPY (EGD) WITH PROPOFOL;  Surgeon: Thornton Park, MD;  Location: [place];  Service: Gastroenterology;  Laterality: N/A;  ? GASTRIC MOTILITY STUDY  11/13/2007  ? mildly delayed emptying subjectively, but normal  analysis-77% of tracer emptied at 2 hours  ? INTRAVASCULAR PRESSURE WIRE/FFR STUDY N/A 08/04/2021  ? Procedure: INTRAVASCULAR PRESSURE WIRE/FFR STUDY;  Surgeon: Nelva Bush, MD;  Location: [place] CV LAB;  Service: Cardiovascular;  Laterality: N/A;  ? JOINT REPLACEMENT    ? LAPAROSCOPIC APPENDECTOMY N/A 09/15/2012  ? Procedure: APPENDECTOMY LAPAROSCOPIC;  Surgeon: [name], MD;  Location: [facility] ORS;  Service: General;  Laterality: N/A;  ? LEFT HEART CATH AND CORONARY ANGIOGRAPHY N/A 08/04/2021  ? Procedure: LEFT HEART CATH AND CORONARY ANGIOGRAPHY;  Surgeon: Nelva Bush, MD;  Location: [place] CV LAB;  Service: Cardiovascular;  Laterality: N/A;  ? LEFT HEART CATHETERIZATION WITH CORONARY ANGIOGRAM N/A 01/05/2014  ? Procedure: LEFT HEART CATHETERIZATION WITH CORONARY ANGIOGRAM;  Surgeon: [name], MD;  Location: [facility] CATH LAB;  Service: Cardiovascular;  Laterality: N/A;  ? REPLACEMENT TOTAL KNEE Right 05-03-06  ? TEE WITHOUT CARDIOVERSION N/A 03/17/2014  ? Procedure: TRANSESOPHAGEAL ECHOCARDIOGRAM (TEE);  Surgeon: [name], MD;  Location: [facility] ENDOSCOPY;  Service: Cardiovascular;  Laterality: N/A;  ? [place]  ? ? ?FAMHx  ? ?Family History  ?Problem Relation Age of Onset  ? Hyperlipidemia Mother   ? Hypertension Mother   ? Heart  attack Mother   ? Cancer Father   ?     lung  ? Hypertension Sister   ? Diabetes Brother   ? Heart disease Brother   ? Hyperlipidemia Brother   ? Hypertension Brother   ? Heart attack Brother   ? Other Brother   ?     DVT  ? ? ?SOCHx  ? ? reports that she has never smoked. She has never used smokeless tobacco. She reports that she does not drink alcohol and does not use drugs. ? ?Outpatient Medications  ? ?No current facility-administered medications on file prior to encounter.  ? ?Current Outpatient Medications on File Prior to Encounter  ?Medication Sig Dispense Refill  ? amLODipine (NORVASC) 5 MG tablet Take 1 tablet (5 mg total) by mouth daily. 30 tablet 2  ? atorvastatin (LIPITOR) 80 MG tablet Take 1 tablet (80 mg total) by mouth every evening. 90 tablet 3  ? colesevelam (WELCHOL) 625 MG tablet Take 1,875 mg by mouth 2 (two) times daily with a meal. Noon and night    ? ELIQUIS 5 MG TABS tablet Take 1  tablet (5 mg total) by mouth 2 (two) times daily. 180 tablet 1  ? ENTRESTO 24-26 MG Take 1 tablet by mouth 2 (two) times daily.    ? furosemide (LASIX) 40 MG tablet Take 40 mg  one tablet by mouth daily , may take an additional 40 mg if needed daily if leg edema worsens 180 tablet 3  ? hydrALAZINE (APRESOLINE) 50 MG tablet Take 0.5 tablets (25 mg total) by mouth 3 (three) times daily. 270 tablet 2  ? Insulin Glargine (BASAGLAR KWIKPEN) 100 UNIT/ML Inject 10 Units into the skin at bedtime. (Patient taking differently: Inject 20 Units into the skin at bedtime.) 30 mL 0  ? JARDIANCE 10 MG TABS tablet Take 10 mg by mouth daily.    ? nitroGLYCERIN (NITROSTAT) 0.4 MG SL tablet PLACE 1 TABLET UNDER THE TONGUE EVERY 5 MINUTES AS NEEDED FOR CHEST PAIN (UP TO 3 DOSES) (Patient taking differently: Place 0.4 mg under the tongue every 5 (five) minutes as needed.) 75 tablet 1  ? NOVOLOG 100 UNIT/ML injection Inject 6-9 Units into the skin 3 (three) times daily with meals. Sliding Scale    ? polyethylene glycol (MIRALAX /  GLYCOLAX) 17 g packet Take 17 g by mouth daily. 14 each 0  ? Vitamin D, Cholecalciferol, 25 MCG (1000 UT) TABS Take 2,000 Units by mouth daily.    ? atenolol (TENORMIN) 25 MG tablet Take 25 mg by mouth daily. (

## 2021-09-15 NOTE — Consult Note (Signed)
WOC Nurse Consult Note: ?Reason for Consult: erythema intertrigo at toes of right foot, web spaces affected are 2-3 and 3-4 (full thickness). Partial thickness skin loss at medial aspect of RGT. ?Wound type:moisture associated skin damage, irritant contact dermatitis, erythema intertrigo ? ?ICD-10 CM Codes for Irritant Dermatitis ? ?K53Z7 - Due to friction or contact with other specified body fluids ? ?L30.4  - Erythema intertrigo. Also used for abrasion of the hand, chafing of the skin, dermatitis due to sweating and friction, friction dermatitis, friction eczema, and genital/thigh intertrigo.  ? ?Pressure Injury POA: N/A ?Measurement: ?RGT: partial thickness, 1cm x 0.5cm x 0.1cm ?Intra digital web spaces between 2-3 and 3-4: 0.5cm x 0.2cm x 0.1cm ?Wound bed: ?RGT: dry, pink ?Intra digital: pink, moist ?Drainage (amount, consistency, odor) scant serous ?Periwound: erythematous ?Dressing procedure/placement/frequency: I will provide Nursing with guidance for the care of these lesions using a once daily using a soap and water cleanse followed by  gentle, but thorough drying and placement of a silver hydrofiber strip dressing to heal the linear lesions caused by friction and moisture   ? ?Additionally, a referral to podiatric medicine (Podiatry) is recommended for an evaluation and POC driven by her bony deformations creating problems with bunions and toe overlap issues as well as ongoing conversations about selecting shoe wear with a wide and high toe box and the possible use of orthotics. If you agree, please refer at discharge. ? ? ?[place] nursing team will not follow, but will remain available to this patient, the nursing and medical teams.  Please re-consult if needed. ?Thanks, ?Maudie Flakes, MSN, RN, [place], [place], CWON-[facility], [place]  ?Pager# [phone]  ? ? ?  ?

## 2021-09-15 NOTE — Progress Notes (Signed)
Critical Lab value reported @ 0235. Troponin 325. Hospitalist notified. ?

## 2021-09-15 NOTE — Progress Notes (Signed)
?  ?       ?PROGRESS NOTE ? ?Cassandra BARRELL JOA:[id number] DOB: 1945-09-14 DOA: 09/14/2021 ?PCP: Sharilyn Sites, MD ? ?Brief History:  ?76 year old female with a history of CAD/NSTEMI (Feb 2023), paroxysmal atrial fibrillation, CKD stage III, diabetes mellitus type 2, hypertension, hyperlipidemia, and sick sinus syndrome presenting with bilateral lower chest pain and upper abdominal pain that began on the morning of 09/14/2021 when she was sitting to eat breakfast.  The patient denied any shortness of breath, but subsequently developed nausea and vomiting x2.  She denied any fevers, chills, headache, neck pain, diarrhea, dysuria, hematuria.  She denies any new medications.  Notably, the patient was recently admitted to the hospital from 08/02/2021 to 08/07/2021 when she was treated for NSTEMI.  She underwent heart catheterization on 08/04/2021 which showed severe two-vessel disease (CTO of RCA with collaterals and 70% proximal/mid LAD).  On 08/05/2021 the patient underwent arthrectomy and stenting of the LAD.  She was discharged home with apixaban at instructed to continue Plavix for 6 months.  She endorses compliance with all her medications.  She had been in her usual state of health until she developed the upper abdominal pain/lower chest pain. ?In the ED, the patient was afebrile and initially hypotensive with a blood pressure of 82/48.  She was noted to have atrial fibrillation with RVR with HR 120-130 the patient was given a bolus normal saline with improvement of her heart rate and blood pressure.  BMP showed a sodium 143, potassium 4.7, serum creatinine 1.57.  WBC 4.8, hemoglobin 13.3, platelets 184,000.  UA was negative for pyuria.  LFTs and lipase were unremarkable.  Troponin 41>> 120.  EKG showed atrial fibrillation with nonspecific ST-T wave changes.  CT of the abdomen and pelvis was negative for any acute findings.  Chest x-ray showed bibasilar atelectasis.  Cardiology was consulted to assist with  management.  ? ?Assessment/Plan: ? ? ?Principal Problem: ?  Atrial fibrillation with RVR ([place]) ?Active Problems: ?  Elevated troponin ?  CAD (coronary artery disease) ?  Diabetic foot ulcer ([place]) ?  Type 2 diabetes mellitus with diabetic neuropathy (HCC) ?  Essential hypertension ?  DM type 2 causing vascular disease ([place]) ?  Stage 3b chronic kidney disease (CKD) ([place]) ?  Chronic diastolic CHF (congestive heart failure) ([place]) ?  Mixed hyperlipidemia ? ?Assessment and Plan: ?* Atrial fibrillation with RVR ([place]) ?Patient has paroxysmal atrial fibrillation ?Current back in sinus rhythm ?Chronically not on any rate controlling medications secondary to SSS ?Cardiology consult ?IV heparin started and lieu of apixaban secondary to elevated troponins ? ?Elevated troponin ?Troponins 41>> 120>> 326 ?Given the patient's presentation with chest pain and increased delta troponin start empiric IV heparin ?Cardiology consult ?Currently chest pain-free ?Limited echocardiogram ? ?CAD (coronary artery disease) ?Recently admitted to 08/02/21 to 08/07/21 with NSTEMI ?Discharged with apixaban and Plavix ?08/04/2021 cath--severe 2 v disease; LAD stent placed 08/05/21 ?IV heparin as discussed pending cardiology evaluation ? ?Diabetic foot ulcer ([place]) ?Not infected on exam ?See pics below--between 3rd and 4th toes ?Wound care consult ? ?Mixed hyperlipidemia ?Continue statin ? ?Chronic diastolic CHF (congestive heart failure) ([place]) ?Clinically compensated ?Resume Entresto ? ?Stage 3b chronic kidney disease (CKD) ([place]) ?Baseline creatinine 1.3-1.5 ? ?DM type 2 causing vascular disease ([place]) ?Uncontrolled. ?06/11/2021 hemoglobin A1c 8.1 ?- SSI- S ?- Resume lantus 10u daily ( Home dose 20u) ?- Hold Jardiance ? ?Essential hypertension ?Presented with hypotension. ?-initially holding Entresto, amlodipine, hydralazine ?-resume Entresto ? ? ? ? ? ? ?  Family Communication:   daughter updated 3/23 ? ?Consultants:  cardiology ? ?Code Status:  FULL ? ?DVT  Prophylaxis:  IV heparin ? ? ?Procedures: ?As Listed in Progress Note Above ? ?Antibiotics: ?None ? ? ? ? ? ? ?Subjective: ?Patient denies fevers, chills, headache, chest pain, dyspnea, nausea, vomiting, diarrhea, abdominal pain, dysuria, hematuria, hematochezia, and melena. ? ? ?Objective: ?Vitals:  ? 09/14/21 2210 09/14/21 2217 09/15/21 0206 09/15/21 3235  ?BP:  125/74 (!) 120/55 (!) 154/55  ?Pulse:  76 (!) 57 (!) 59  ?Resp:  '20 16 16  '$ ?Temp:  98.5 ?F (36.9 ?C) 97.9 ?F (36.6 ?C) 97.8 ?F (36.6 ?C)  ?TempSrc:  Oral Oral Oral  ?SpO2:  100% 100% 99%  ?Weight: 71.7 kg     ?Height: '5\' 6"'$  (1.676 m)     ? ?No intake or output data in the 24 hours ending 09/15/21 0729 ?Weight change:  ?Exam: ? ?General:  Pt is alert, follows commands appropriately, not in acute distress ?HEENT: No icterus, No thrush, No neck mass, /AT ?Cardiovascular: RRR, S1/S2, no rubs, no gallops ?Respiratory: diminished BS.  Bibasilar crackles. No wheeze ?Abdomen: Soft/+BS, non tender, non distended, no guarding ?Extremities: No edema, No lymphangitis, No petechiae, No rashes, no synovitis;  clean ulcer between right 3rd and 4th toe without redness, edema, pus ? ? ? ? ? ? ?Data Reviewed: ?I have personally reviewed following labs and imaging studies ?Basic Metabolic Panel: ?Recent Labs  ?Lab 09/14/21 ?1440 09/15/21 ?0104  ?NA 143 141  ?K 4.7 4.1  ?CL 108 107  ?CO2 24 25  ?GLUCOSE 226* 222*  ?BUN 30* 31*  ?CREATININE 1.57* 1.57*  ?CALCIUM 10.3 9.4  ? ?Liver Function Tests: ?Recent Labs  ?Lab 09/14/21 ?1440  ?AST 36  ?ALT 27  ?ALKPHOS 69  ?BILITOT 0.7  ?PROT 7.2  ?ALBUMIN 3.8  ? ?Recent Labs  ?Lab 09/14/21 ?1440  ?LIPASE 25  ? ?No results for input(s): AMMONIA in the last 168 hours. ?Coagulation Profile: ?No results for input(s): INR, PROTIME in the last 168 hours. ?CBC: ?Recent Labs  ?Lab 09/14/21 ?1440 09/15/21 ?0104  ?WBC 4.8 3.9*  ?NEUTROABS 3.4  --   ?HGB 13.3 11.3*  ?HCT 38.8 32.3*  ?MCV 81.2 80.0  ?PLT 184 158  ? ?Cardiac Enzymes: ?No results  for input(s): CKTOTAL, CKMB, CKMBINDEX, TROPONINI in the last 168 hours. ?BNP: ?Invalid input(s): POCBNP ?CBG: ?No results for input(s): GLUCAP in the last 168 hours. ?HbA1C: ?No results for input(s): HGBA1C in the last 72 hours. ?Urine analysis: ?   ?Component Value Date/Time  ? COLORURINE STRAW (A) 09/14/2021 1339  ? APPEARANCEUR CLEAR 09/14/2021 1339  ? LABSPEC 1.013 09/14/2021 1339  ? PHURINE 5.0 09/14/2021 1339  ? GLUCOSEU >=500 (A) 09/14/2021 1339  ? HGBUR SMALL (A) 09/14/2021 1339  ? [place] NEGATIVE 09/14/2021 1339  ? Benjamin Stain NEGATIVE 09/14/2021 1339  ? PROTEINUR NEGATIVE 09/14/2021 1339  ? UROBILINOGEN 0.2 09/15/2012 1010  ? NITRITE NEGATIVE 09/14/2021 1339  ? LEUKOCYTESUR NEGATIVE 09/14/2021 1339  ? ?Sepsis Labs: ?'@LABRCNTIP'$ (procalcitonin:4,lacticidven:4) ?)No results found for this or any previous visit (from the past 240 hour(s)).  ? ?Scheduled Meds: ? apixaban  5 mg Oral BID  ? atorvastatin  80 mg Oral QPM  ? feeding supplement  237 mL Oral BID BM  ? insulin aspart  0-5 Units Subcutaneous QHS  ? insulin aspart  0-9 Units Subcutaneous TID WC  ? insulin glargine-yfgn  10 Units Subcutaneous QHS  ? ?Continuous Infusions: ? ?Procedures/Studies: ?CT ABDOMEN PELVIS W CONTRAST ? ?  Result Date: 09/14/2021 ?CLINICAL DATA:  Nausea vomiting and abdominal pain EXAM: CT ABDOMEN AND PELVIS WITH CONTRAST TECHNIQUE: Multidetector CT imaging of the abdomen and pelvis was performed using the standard protocol following bolus administration of intravenous contrast. RADIATION DOSE REDUCTION: This exam was performed according to the departmental dose-optimization program which includes automated exposure control, adjustment of the mA and/or kV according to patient size and/or use of iterative reconstruction technique. CONTRAST:  63m OMNIPAQUE IOHEXOL 300 MG/ML  SOLN COMPARISON:  CT abdomen and pelvis dated September 15, 2012 FINDINGS: Lower chest: No acute abnormality. Hepatobiliary: No focal liver abnormality is seen. No  gallstones, gallbladder wall thickening, or biliary dilatation. Pancreas: Unremarkable. No pancreatic ductal dilatation or surrounding inflammatory changes. Spleen: Normal in size without focal abnormality. Adrenal

## 2021-09-15 NOTE — Assessment & Plan Note (Addendum)
Not infected on exam ?See pics below--between 3rd and 4th toes ?Wound care consult appreciated--- once daily using a soap and water cleanse followed by  gentle, but thorough drying and placement of a silver hydrofiber strip dressing to heal the linear lesions caused by friction and moisture   ?Will need outpatient referral to podiatry ?

## 2021-09-15 NOTE — Progress Notes (Signed)
Initial Nutrition Assessment ? ?DOCUMENTATION CODES:  ? ?Non-severe (moderate) malnutrition in context of acute illness/injury ? ?INTERVENTION:  ?- Encourage adequate PO intake ? ?- Ensure Enlive po BID, each supplement provides 350 kcal and 20 grams of protein (pt prefers vanilla) ? ?NUTRITION DIAGNOSIS:  ? ?Moderate Malnutrition related to acute illness (NSTEMI, CAD, T2DM, CHF) as evidenced by mild fat depletion, moderate muscle depletion. ? ?GOAL:  ? ?Patient will meet greater than or equal to 90% of their needs ? ?MONITOR:  ? ?PO intake, Supplement acceptance, Labs ? ?REASON FOR ASSESSMENT:  ? ?Malnutrition Screening Tool ?  ? ?ASSESSMENT:  ? ?Pt admitted with abdominal pain and vomiting. Prior admission 2/7-2/12 and was treated for NSTEMI. PMH significant for CHF, CKD, afib on anticoagulation, NSTEMI, T2DM, HTN, CAD with recent stent placement ? ?Pt reports eating 3 meals per day PTA with no recent changes in appetite or PO intake. Her family will prepare bulk meals for her to have throughout the week. She states that she typically eats grits with milk and coffee for breakfast. For lunch she has chicken with broccoli and cauliflower. Dinner she will typically have string beans, mixed vegetables and chicken for dinner. At home she drinks 1 Ensure daily. Pt states that she checks her blood sugar daily and recently her morning blood sugar runs 40-60 and was told to eat a snack before bedtime to help with this. Her daytime blood sugars are typically 110-120. Pt wears dentures which she did not bring with her to the hospital however she is finding foods that she enjoys and is able to tolerate well. ? ?1 meal completion documented of 80% ? ?Pt states that her usual weight is 180 lbs and has had slow weight loss. She weighs herself daily given her history of CHF. Reviewed weight history. Pt's weight has been trending down since July. Current weight noted to be 158 lbs. Within the last month, she's noted to have a 9%  weight loss.  ? ?Medications: SSI, semglee 10 units daily ? ?Labs: BUN 31, Cr 1.57, HgbA1c 8.1% (06/11/21),   ? CBG's 166-326 x12 hours ? ?NUTRITION - FOCUSED PHYSICAL EXAM: ? ?Flowsheet Row Most Recent Value  ?Orbital Region No depletion  ?Upper Arm Region Mild depletion  ?Thoracic and Lumbar Region No depletion  ?Buccal Region Mild depletion  ?Temple Region Moderate depletion  ?Clavicle Bone Region Mild depletion  ?Clavicle and Acromion Bone Region No depletion  ?Scapular Bone Region No depletion  ?Dorsal Hand Mild depletion  ?Patellar Region Moderate depletion  ?Anterior Thigh Region Moderate depletion  ?Posterior Calf Region Mild depletion  ?Edema (RD Assessment) None  ?Hair Reviewed  ?Eyes Reviewed  ?Mouth Other (Comment)  [wears dentures]  ?Skin Reviewed  ?Nails Reviewed  ? ?  ? ? ?Diet Order:   ?Diet Order   ? ?       ?  Diet heart healthy/carb modified Room service appropriate? Yes; Fluid consistency: Thin  Diet effective now       ?  ? ?  ?  ? ?  ? ? ?EDUCATION NEEDS:  ? ?Education needs have been addressed ? ?Skin:  Skin Assessment: Reviewed RN Assessment ? ?Last BM:  3/22 ? ?Height:  ? ?Ht Readings from Last 1 Encounters:  ?09/14/21 '5\' 6"'$  (1.676 m)  ? ? ?Weight:  ? ?Wt Readings from Last 1 Encounters:  ?09/14/21 71.7 kg  ? ? ?BMI:  Body mass index is 25.51 kg/m?. ? ?Estimated Nutritional Needs:  ? ?Kcal:  1700-1900 ? ?  Protein:  85-100g ? ?Fluid:  >/=1.7L ? ?Cl[place]borne Dana, RDN, LDN ?Clinical Nutrition ?

## 2021-09-15 NOTE — Progress Notes (Signed)
*  PRELIMINARY RESULTS* ?Echocardiogram ?2D Echocardiogram has been performed. ? ?Cassandra Adams ?09/15/2021, 12:46 PM ?

## 2021-09-15 NOTE — Assessment & Plan Note (Signed)
Continue statin. 

## 2021-09-15 NOTE — Care Management Obs Status (Signed)
MEDICARE OBSERVATION STATUS NOTIFICATION ? ? ?Patient Details  ?Name: Cassandra Adams ?MRN: [id number] ?Date of Birth: 12-19-1945 ? ? ?Medicare Observation Status Notification Given:  Yes (spoke with [name] verbally at [phone], copy will be proveded) ? ? ? ?Tommy Medal ?09/15/2021, 8:23 PM ?

## 2021-09-15 NOTE — TOC Progression Note (Signed)
Transition of Care (TOC) - Progression Note  ? ? ?Patient Details  ?Name: Cassandra Adams ?MRN: [id number] ?Date of Birth: [birth year] ? ?Transition of Care (TOC) CM/SW Contact  ?Salome Arnt, LCSW ?Phone Number: ?09/15/2021, 9:56 AM ? ?Clinical Narrative:    ?Transition of Care (TOC) Screening Note ? ? ?Patient Details  ?Name: Cassandra Adams ?Date of Birth: Aug 03, 1945 ? ? ?Transition of Care (TOC) CM/SW Contact:    ?Salome Arnt, LCSW ?Phone Number: ?09/15/2021, 9:56 AM ? ? ? ?Transition of Care Department [facility]) has reviewed patient and no TOC needs have Adams identified at this time. We will continue to monitor patient advancement through interdisciplinary progression rounds. If new patient transition needs arise, please place a TOC consult. ?  ? ? ? ?  ?Barriers to Discharge: Continued Medical Work up ? ?Expected Discharge Plan and Services ?  ?  ?  ?  ?  ?                ?  ?  ?  ?  ?  ?  ?  ?  ?  ?  ? ? ?Social Determinants of Health (SDOH) Interventions ?  ? ?Readmission Risk Interventions ? ?  01/18/2021  ?  2:41 PM 01/18/2021  ?  2:35 PM 05/09/2019  ? 10:01 AM  ?Readmission Risk Prevention Plan  ?Transportation Screening Complete Complete Complete  ?PCP or Specialist Appt within 3-5 Days Complete    ?Home Care Screening   Complete  ?Medication Review (RN CM)   Complete  ?[place] or Home Care Consult Complete Complete   ?Social Work Consult for [place] Planning/Counseling Complete    ?Palliative Care Screening Not Applicable    ?Medication Review [employer]) Complete    ? ? ?

## 2021-09-15 NOTE — Progress Notes (Signed)
Inpatient Diabetes Program Recommendations ? ?AACE/ADA: New Consensus Statement on Inpatient Glycemic Control (2015) ? ?Target Ranges:  Prepandial:   less than 140 mg/dL ?     Peak postprandial:   less than 180 mg/dL (1-2 hours) ?     Critically ill patients:  140 - 180 mg/dL  ? ?Lab Results  ?Component Value Date  ? GLUCAP 326 (H) 09/15/2021  ? HGBA1C 8.1 (H) 06/11/2021  ? ? ?Review of Glycemic Control ? Latest Reference Range & Units 09/14/21 22:20 09/15/21 07:46 09/15/21 11:38  ?Glucose-Capillary 70 - 99 mg/dL 171 (H) 166 (H) 326 (H)  ? ?Diabetes history: DM 2 ?Outpatient Diabetes medications: Basaglar 20 units qhs, Jardiance 10 mg Daily, Novolog 6-9 units tid, Freestyle Libre CGM ?Current orders for Inpatient glycemic control:  ?Semglee 10 units ?Novolog 0-9 units tid + hs ? ?Ensure Enlive bid between meals ? ?Inpatient Diabetes Program Recommendations:   ? ?-  Glucose trends increase after meal/supplement intake ?-  Consider Novolog 3 units tid meal coverage if eating >50% of meals. ? ?Thanks, ? ?Tama Headings RN, MSN, BC-ADM ?Inpatient Diabetes Coordinator ?Team Pager [phone] (8a-5p) ? ? ?

## 2021-09-15 NOTE — Progress Notes (Signed)
ANTICOAGULATION CONSULT NOTE - Initial Consult ? ?Pharmacy Consult for heparin gtt  ?Indication: chest pain/ACS and atrial fibrillation ? ?Allergies  ?Allergen Reactions  ? Penicillins Hives  ?  Has patient had a PCN reaction causing immediate rash, facial/tongue/throat swelling, SOB or lightheadedness with hypotension: no ?Has patient had a PCN reaction causing severe rash involving mucus membranes or skin necrosis: No  ?Has patient had a PCN reaction that required hospitalization: no ?Has patient had a PCN reaction occurring within the last 10 years: no ?If all of the above answers are "NO", then may proceed with Cephalosporin use. ?  ? ? ?Patient Measurements: ?Height: '5\' 6"'$  (167.6 cm) ?Weight: 71.7 kg (158 lb 1.1 oz) ?IBW/kg (Calculated) : 59.3 ?Heparin Dosing Weight: HEPARIN DW (KG): 71.7 ? ? ?Vital Signs: ?Temp: 97.8 ?F (36.6 ?C) (03/23 8756) ?Temp Source: Oral (03/23 4332) ?BP: 154/55 (03/23 9518) ?Pulse Rate: 59 (03/23 0626) ? ?Labs: ?Recent Labs  ?  09/14/21 ?1440 09/15/21 ?0104  ?HGB 13.3 11.3*  ?HCT 38.8 32.3*  ?PLT 184 158  ?CREATININE 1.57* 1.57*  ? ? ?Estimated Creatinine Clearance: 30.9 mL/min (A) (by C-G formula based on SCr of 1.57 mg/dL (H)). ? ? ?Medical History: ?Past Medical History:  ?Diagnosis Date  ? (HFpEF) heart failure with preserved ejection fraction ([place])   ? Limited Echo 7/22: Mild asymmetrical LVH due to scar and thinning of basal posterior wall and background conc LVH, apical and mid segments hyperdynamic, EF 60-65, basal inf-lat and basal inf AK, normal RVSF, RVSP 26.6, mild LAE, mild-moderate MR, mild AV sclerosis w/o AS // Echo 5/22: EF 55-60, RVSP 27, mild to mod LAE, poss small to mod eff; AV sclerosis no AS  ? Arthritis   ? "all over"  ? CAD (coronary artery disease)   ? a. NSTEMI 12/2013 - occluded dominant RCA with L-R collaterals, moderate prox segmental LAD disease, for medical therapy initially, EF 60% with subtle inferobasal hypokinesia. Consider PCI for refractory CP.  ?  CKD (chronic kidney disease), stage III ([place])   ? Hypertension   ? Migraines   ? "weekly" (01/06/2014)  ? Sickle cell trait ([place])   ? TIA (transient ischemic attack) 1978  ? Type II diabetes mellitus ([place])   ? Vertigo   ? ? ?Medications:  ?Medications Prior to Admission  ?Medication Sig Dispense Refill Last Dose  ? amLODipine (NORVASC) 5 MG tablet Take 1 tablet (5 mg total) by mouth daily. 30 tablet 2 09/14/2021  ? atorvastatin (LIPITOR) 80 MG tablet Take 1 tablet (80 mg total) by mouth every evening. 90 tablet 3 09/13/2021  ? colesevelam (WELCHOL) 625 MG tablet Take 1,875 mg by mouth 2 (two) times daily with a meal. Noon and night   09/14/2021  ? ELIQUIS 5 MG TABS tablet Take 1 tablet (5 mg total) by mouth 2 (two) times daily. 180 tablet 1 09/14/2021 at 0815  ? ENTRESTO 24-26 MG Take 1 tablet by mouth 2 (two) times daily.   09/14/2021  ? furosemide (LASIX) 40 MG tablet Take 40 mg  one tablet by mouth daily , may take an additional 40 mg if needed daily if leg edema worsens 180 tablet 3 09/14/2021  ? hydrALAZINE (APRESOLINE) 50 MG tablet Take 0.5 tablets (25 mg total) by mouth 3 (three) times daily. 270 tablet 2 09/14/2021  ? Insulin Glargine (BASAGLAR KWIKPEN) 100 UNIT/ML Inject 10 Units into the skin at bedtime. (Patient taking differently: Inject 20 Units into the skin at bedtime.) 30 mL 0 09/13/2021  ?  JARDIANCE 10 MG TABS tablet Take 10 mg by mouth daily.   09/14/2021  ? nitroGLYCERIN (NITROSTAT) 0.4 MG SL tablet PLACE 1 TABLET UNDER THE TONGUE EVERY 5 MINUTES AS NEEDED FOR CHEST PAIN (UP TO 3 DOSES) (Patient taking differently: Place 0.4 mg under the tongue every 5 (five) minutes as needed.) 75 tablet 1 unknown  ? NOVOLOG 100 UNIT/ML injection Inject 6-9 Units into the skin 3 (three) times daily with meals. Sliding Scale   09/14/2021  ? polyethylene glycol (MIRALAX / GLYCOLAX) 17 g packet Take 17 g by mouth daily. 14 each 0 09/13/2021  ? Vitamin D, Cholecalciferol, 25 MCG (1000 UT) TABS Take 2,000 Units by mouth daily.    09/14/2021  ? atenolol (TENORMIN) 25 MG tablet Take 25 mg by mouth daily. (Patient not taking: Reported on 09/14/2021)   Not Taking  ? B-D ULTRAFINE III SHORT PEN 31G X 8 MM MISC SMARTSIG:Injection Daily     ? BD INSULIN SYRINGE U/F 31G X 5/16" 0.5 ML MISC Use to take with Novolog TIDAC 100 each 5   ? clopidogrel (PLAVIX) 75 MG tablet Take 1 tablet (75 mg total) by mouth daily with breakfast. (Patient not taking: Reported on 09/14/2021) 30 tablet 2 Not Taking at 0815  ? Continuous Blood Gluc Receiver (FREESTYLE LIBRE 2 READER) DEVI 1 each by Does not apply route daily in the afternoon. 1 each 0   ? Continuous Blood Gluc Sensor (FREESTYLE LIBRE 2 SENSOR) MISC 1 each by Does not apply route daily in the afternoon. 1 each 0   ? ONETOUCH ULTRA test strip TESTING 3 TIMES DAILY     ? ?Scheduled:  ? atorvastatin  80 mg Oral QPM  ? feeding supplement  237 mL Oral BID BM  ? heparin  4,000 Units Intravenous Once  ? insulin aspart  0-5 Units Subcutaneous QHS  ? insulin aspart  0-9 Units Subcutaneous TID WC  ? insulin glargine-yfgn  10 Units Subcutaneous QHS  ? sacubitril-valsartan  1 tablet Oral BID  ? ?Infusions:  ? heparin    ? ?PRN: acetaminophen **OR** acetaminophen, ondansetron **OR** ondansetron (ZOFRAN) IV, polyethylene glycol ?Anti-infectives (From admission, onward)  ? ? None  ? ?  ? ? ?Assessment: ?Cassandra Adams a 76 y.o. female requires anticoagulation with a heparin iv infusion for the indication of  chest pain/ACS and atrial fibrillation. Heparin gtt will be started following pharmacy protocol per pharmacy consult. Patient is on previous oral anticoagulant, apixaban '5mg'$  last dose 3/22'@0800'$ , that will require aPTT/HL correlation before transitioning to only HL monitoring.  ? ?Goal of Therapy:  ?Heparin level 0.3-0.7 units/ml ?aPTT 66-102 seconds ?Monitor platelets by anticoagulation protocol: Yes ?  ?Plan:  ?Give 4000 units bolus x 1 ?Start heparin infusion at 850 units/hr ?Check anti-Xa level and aPTT in 8  hours and daily while on heparin ?Continue to monitor H&H and platelets ? ? ?Donna Christen Paulette Lynch ?09/15/2021,7:36 AM ? ? ?

## 2021-09-16 DIAGNOSIS — I5032 Chronic diastolic (congestive) heart failure: Secondary | ICD-10-CM | POA: Diagnosis not present

## 2021-09-16 DIAGNOSIS — E1159 Type 2 diabetes mellitus with other circulatory complications: Secondary | ICD-10-CM | POA: Diagnosis not present

## 2021-09-16 DIAGNOSIS — I248 Other forms of acute ischemic heart disease: Secondary | ICD-10-CM | POA: Diagnosis not present

## 2021-09-16 DIAGNOSIS — E782 Mixed hyperlipidemia: Secondary | ICD-10-CM | POA: Diagnosis not present

## 2021-09-16 DIAGNOSIS — N1832 Chronic kidney disease, stage 3b: Secondary | ICD-10-CM | POA: Diagnosis not present

## 2021-09-16 DIAGNOSIS — I2489 Other forms of acute ischemic heart disease: Secondary | ICD-10-CM

## 2021-09-16 DIAGNOSIS — I10 Essential (primary) hypertension: Secondary | ICD-10-CM | POA: Diagnosis not present

## 2021-09-16 DIAGNOSIS — R778 Other specified abnormalities of plasma proteins: Secondary | ICD-10-CM | POA: Diagnosis not present

## 2021-09-16 DIAGNOSIS — I4891 Unspecified atrial fibrillation: Secondary | ICD-10-CM | POA: Diagnosis not present

## 2021-09-16 DIAGNOSIS — I2510 Atherosclerotic heart disease of native coronary artery without angina pectoris: Secondary | ICD-10-CM | POA: Diagnosis not present

## 2021-09-16 LAB — CBC
HCT: 29.8 % — ABNORMAL LOW (ref 36.0–46.0)
Hemoglobin: 10.5 g/dL — ABNORMAL LOW (ref 12.0–15.0)
MCH: 28.7 pg (ref 26.0–34.0)
MCHC: 35.2 g/dL (ref 30.0–36.0)
MCV: 81.4 fL (ref 80.0–100.0)
Platelets: 151 10*3/uL (ref 150–400)
RBC: 3.66 MIL/uL — ABNORMAL LOW (ref 3.87–5.11)
RDW: 15.8 % — ABNORMAL HIGH (ref 11.5–15.5)
WBC: 3.2 10*3/uL — ABNORMAL LOW (ref 4.0–10.5)
nRBC: 0 % (ref 0.0–0.2)

## 2021-09-16 LAB — BASIC METABOLIC PANEL
Anion gap: 6 (ref 5–15)
BUN: 36 mg/dL — ABNORMAL HIGH (ref 8–23)
CO2: 27 mmol/L (ref 22–32)
Calcium: 9.5 mg/dL (ref 8.9–10.3)
Chloride: 109 mmol/L (ref 98–111)
Creatinine, Ser: 1.35 mg/dL — ABNORMAL HIGH (ref 0.44–1.00)
GFR, Estimated: 41 mL/min — ABNORMAL LOW (ref >60)
Glucose, Bld: 68 mg/dL — ABNORMAL LOW (ref 70–99)
Potassium: 4.4 mmol/L (ref 3.5–5.1)
Sodium: 142 mmol/L (ref 135–145)

## 2021-09-16 LAB — GLUCOSE, CAPILLARY
Glucose-Capillary: 78 mg/dL (ref 70–99)
Glucose-Capillary: 281 mg/dL — ABNORMAL HIGH (ref 70–99)

## 2021-09-16 LAB — MAGNESIUM: Magnesium: 2.7 mg/dL — ABNORMAL HIGH (ref 1.7–2.4)

## 2021-09-16 MED ORDER — CLOPIDOGREL BISULFATE 75 MG PO TABS: 75.0000 mg | ORAL_TABLET | Freq: Every day | ORAL | 1 refills | Status: DC

## 2021-09-16 MED ORDER — ISOSORBIDE MONONITRATE ER 30 MG PO TB24: 30.0000 mg | ORAL_TABLET | Freq: Every day | ORAL | 1 refills | Status: DC

## 2021-09-16 NOTE — Assessment & Plan Note (Signed)
Appreciate cardiology consult>>elevated troponins not ACS ?Resume apixaban and plavix ?

## 2021-09-16 NOTE — Discharge Summary (Signed)
?Physician Discharge Summary ?  ?Patient: Cassandra Adams MRN: [id number] DOB: 01-17-1946  ?Admit date:     09/14/2021  ?Discharge date: 09/16/21  ?Discharge Physician: Shanon Brow Juneau Doughman  ? ?PCP: Sharilyn Sites, MD  ? ?Recommendations at discharge:  ? Please follow up with primary care provider within 1-2 weeks ? Please repeat BMP and CBC in one week ? Please  make referral to podiatry for the patient for her diabetic foot ulcer and nail care ? ? ? ?Hospital Course: ?76 year old female with a history of CAD/NSTEMI (Feb 2023), paroxysmal atrial fibrillation, CKD stage III, diabetes mellitus type 2, hypertension, hyperlipidemia, and sick sinus syndrome presenting with bilateral lower chest pain and upper abdominal pain that began on the morning of 09/14/2021 when she was sitting to eat breakfast.  The patient denied any shortness of breath, but subsequently developed nausea and vomiting x2.  She denied any fevers, chills, headache, neck pain, diarrhea, dysuria, hematuria.  She denies any new medications.  Notably, the patient was recently admitted to the hospital from 08/02/2021 to 08/07/2021 when she was treated for NSTEMI.  She underwent heart catheterization on 08/04/2021 which showed severe two-vessel disease (CTO of RCA with collaterals and 70% proximal/mid LAD).  On 08/05/2021 the patient underwent arthrectomy and stenting of the LAD.  She was discharged home with apixaban at instructed to continue Plavix for 6 months.  She endorses compliance with all her medications.  She had been in her usual state of health until she developed the upper abdominal pain/lower chest pain. ?In the ED, the patient was afebrile and initially hypotensive with a blood pressure of 82/48.  She was noted to have atrial fibrillation with RVR with HR 120-130 the patient was given a bolus normal saline with improvement of her heart rate and blood pressure.  BMP showed a sodium 143, potassium 4.7, serum creatinine 1.57.  WBC 4.8, hemoglobin 13.3,  platelets 184,000.  UA was negative for pyuria.  LFTs and lipase were unremarkable.  Troponin 41>> 120.  EKG showed atrial fibrillation with nonspecific ST-T wave changes.  CT of the abdomen and pelvis was negative for any acute findings.  Chest x-ray showed bibasilar atelectasis.  Cardiology was consulted to assist with management.  They felt her elevated troponins were due to demand ischemia rather than ACS.  IV heparin was stopped and patient restarted back on apixaban and plavix.  She sponataneously converted back to sinus and remained in sinus for the rest of the hospitalization. ? ?Assessment and Plan: ?* Atrial fibrillation with RVR ([place]) ?Patient has paroxysmal atrial fibrillation ?spontanesouly converted to sinus rhythm and remained in sinus ?Chronically not on any rate controlling medications secondary to SSS ?Cardiology consult appreciated ?Not on chronotropic meds due to bradycardia at baseline and SSS ?apixaban restarted ? ?Elevated troponin ?Troponins 41>> 120>> 326 ?Given the patient's presentation with chest pain and increased delta troponin start empiric IV heparin ?Cardiology consult>>given unchanged echo and presentation, felt this was more due to demand ischemia rather than ACS ?Currently chest pain-free ?Limited echocardiogram EF 55-60, unchanged basal septal HK, no new WMA ?Stopped IV heparin>>restart apixaban and plavix ? ?CAD (coronary artery disease) ?Recently admitted to 08/02/21 to 08/07/21 with NSTEMI ?Discharged with apixaban and Plavix ?08/04/2021 cath--severe 2 v disease; LAD stent placed 08/05/21 ?IV heparin as discussed pending cardiology evaluation ? ?Diabetic foot ulcer ([place]) ?Not infected on exam ?See pics below--between 3rd and 4th toes ?Wound care consult appreciated--- once daily using a soap and water cleanse followed by  gentle, but  thorough drying and placement of a silver hydrofiber strip dressing to heal the linear lesions caused by friction and moisture   ?Will need outpatient  referral to podiatry ? ?Demand ischemia ([place]) ?Appreciate cardiology consult>>elevated troponins not ACS ?Resume apixaban and plavix ? ?Mixed hyperlipidemia ?Continue statin ? ?Chronic diastolic CHF (congestive heart failure) ([place]) ?Clinically compensated ?Resume Entresto ? ?Stage 3b chronic kidney disease (CKD) ([place]) ?Baseline creatinine 1.3-1.5 ? ?DM type 2 causing vascular disease ([place]) ?Uncontrolled. ?06/11/2021 hemoglobin A1c 8.1 ?- SSI- S ?- Resume lantus 10u daily ( Home dose 20u) ?- Hold Jardiance ? ?Essential hypertension ?Presented with hypotension. ?-initially holding Entresto, amlodipine, hydralazine ?-resume Entresto ?-resume hydralazine as BP much improved, no hypertensive again ?-amlodipine stopped ?-imdur started ? ? ? ? ? ?  ? ? ?Consultants: cardiology ?Procedures performed: none  ?Disposition: Home ?Diet recommendation:  ?Cardiac diet ?DISCHARGE MEDICATION: ?Allergies as of 09/16/2021   ? ?   Reactions  ? Penicillins Hives  ? Has patient had a PCN reaction causing immediate rash, facial/tongue/throat swelling, SOB or lightheadedness with hypotension: no ?Has patient had a PCN reaction causing severe rash involving mucus membranes or skin necrosis: No  ?Has patient had a PCN reaction that required hospitalization: no ?Has patient had a PCN reaction occurring within the last 10 years: no ?If all of the above answers are "NO", then may proceed with Cephalosporin use.  ? ?  ? ?  ?Medication List  ?  ? ?STOP taking these medications   ? ?amLODipine 5 MG tablet ?Commonly known as: NORVASC ?  ?atenolol 25 MG tablet ?Commonly known as: TENORMIN ?  ? ?  ? ?TAKE these medications   ? ?atorvastatin 80 MG tablet ?Commonly known as: LIPITOR ?Take 1 tablet (80 mg total) by mouth every evening. ?  ?B-D ULTRAFINE III SHORT PEN 31G X 8 MM Misc ?Generic drug: Insulin Pen Needle ?SMARTSIG:Injection Daily ?  ?Basaglar KwikPen 100 UNIT/ML ?Inject 10 Units into the skin at bedtime. ?What changed: how much to take ?  ?BD  Insulin Syringe U/F 31G X 5/16" 0.5 ML Misc ?Generic drug: Insulin Syringe-Needle U-100 ?Use to take with Novolog [facility] ?  ?clopidogrel 75 MG tablet ?Commonly known as: PLAVIX ?Take 1 tablet (75 mg total) by mouth daily. ?Start taking on: September 17, 2021 ?What changed: when to take this ?  ?colesevelam 625 MG tablet ?Commonly known as: WELCHOL ?Take 1,875 mg by mouth 2 (two) times daily with a meal. Noon and night ?  ?Eliquis 5 MG Tabs tablet ?Generic drug: apixaban ?Take 1 tablet (5 mg total) by mouth 2 (two) times daily. ?  ?Entresto 24-26 MG ?Generic drug: sacubitril-valsartan ?Take 1 tablet by mouth 2 (two) times daily. ?  ?FreeStyle Libre 2 Reader Kerrin Mo ?1 each by Does not apply route daily in the afternoon. ?  ?FreeStyle Libre 2 Sensor Misc ?1 each by Does not apply route daily in the afternoon. ?  ?furosemide 40 MG tablet ?Commonly known as: LASIX ?Take 40 mg  one tablet by mouth daily , may take an additional 40 mg if needed daily if leg edema worsens ?  ?hydrALAZINE 50 MG tablet ?Commonly known as: APRESOLINE ?Take 0.5 tablets (25 mg total) by mouth 3 (three) times daily. ?  ?isosorbide mononitrate 30 MG 24 hr tablet ?Commonly known as: IMDUR ?Take 1 tablet (30 mg total) by mouth daily. ?Start taking on: September 17, 2021 ?  ?Jardiance 10 MG Tabs tablet ?Generic drug: empagliflozin ?Take 10 mg by mouth daily. ?  ?  nitroGLYCERIN 0.4 MG SL tablet ?Commonly known as: NITROSTAT ?PLACE 1 TABLET UNDER THE TONGUE EVERY 5 MINUTES AS NEEDED FOR CHEST PAIN (UP TO 3 DOSES) ?What changed: See the new instructions. ?  ?NovoLOG 100 UNIT/ML injection ?Generic drug: insulin aspart ?Inject 6-9 Units into the skin 3 (three) times daily with meals. Sliding Scale ?  ?OneTouch Ultra test strip ?Generic drug: glucose blood ?TESTING 3 TIMES DAILY ?  ?polyethylene glycol 17 g packet ?Commonly known as: MIRALAX / GLYCOLAX ?Take 17 g by mouth daily. ?  ?Vitamin D (Cholecalciferol) 25 MCG (1000 UT) Tabs ?Take 2,000 Units by mouth daily. ?   ? ?  ? ? ?Discharge Exam: ?Filed Weights  ? 09/14/21 1221 09/14/21 2210  ?Weight: 72.6 kg 71.7 kg  ? ?HEENT:  [place]/AT, No thrush, no icterus ?CV:  RRR, no rub, no S3, no S4 ?Lung:  CTA, no wheeze, no rhonc

## 2021-09-16 NOTE — Progress Notes (Signed)
? ?Progress Note ? ?Patient Name: Cassandra Adams ?Date of Encounter: 09/16/2021 ? ?[facility] Cardiologist: [name], MD  ? ?Subjective  ? ?Denies any chest pain or dyspnea.  Does report feeling lightheaded. ? ?Inpatient Medications  ?  ?Scheduled Meds: ? apixaban  5 mg Oral BID  ? atorvastatin  80 mg Oral QPM  ? clopidogrel  75 mg Oral Daily  ? feeding supplement (GLUCERNA SHAKE)  237 mL Oral BID BM  ? insulin aspart  0-5 Units Subcutaneous QHS  ? insulin aspart  0-9 Units Subcutaneous TID WC  ? insulin glargine-yfgn  10 Units Subcutaneous QHS  ? isosorbide mononitrate  30 mg Oral Daily  ? sacubitril-valsartan  1 tablet Oral BID  ? ?Continuous Infusions: ? ?PRN Meds: ?acetaminophen **OR** acetaminophen, ondansetron **OR** ondansetron (ZOFRAN) IV, polyethylene glycol  ? ?Vital Signs  ?  ?Vitals:  ? 09/15/21 0626 09/15/21 1407 09/15/21 2029 09/16/21 0413  ?BP: (!) 154/55 (!) 109/51 (!) 122/56 (!) 142/77  ?Pulse: (!) 59 (!) 52 (!) 58 (!) 50  ?Resp: '16 18 16 15  '$ ?Temp: 97.8 ?F (36.6 ?C) 98.1 ?F (36.7 ?C) 98.4 ?F (36.9 ?C) 97.8 ?F (36.6 ?C)  ?TempSrc: Oral  Oral Oral  ?SpO2: 99% 97% 100% 98%  ?Weight:      ?Height:      ? ? ?Intake/Output Summary (Last 24 hours) at 09/16/2021 1127 ?Last data filed at 09/16/2021 1049 ?Gross per 24 hour  ?Intake 720 ml  ?Output --  ?Net 720 ml  ? ? ?  09/14/2021  ? 10:10 PM 09/14/2021  ? 12:21 PM 08/16/2021  ?  2:48 PM  ?Last 3 Weights  ?Weight (lbs) 158 lb 1.1 oz 160 lb 162 lb 9.6 oz  ?Weight (kg) 71.7 kg 72.576 kg 73.755 kg  ?   ? ?Telemetry  ?  ?Sinus rhythm, rate 40s to 70s- Personally Reviewed ? ?ECG  ?  ?No new ECG- Personally Reviewed ? ?Physical Exam  ? ?GEN: No acute distress.   ?Neck: No JVD ?Cardiac: RRR, no murmurs, rubs, or gallops.  ?Respiratory: Clear to auscultation bilaterally. ?GI: Soft, nontender, non-distended  ?MS: No edema; No deformity. ?Neuro:  Nonfocal  ?Psych: Normal affect  ? ?Labs  ?  ?High Sensitivity Troponin:   ?Recent Labs  ?Lab 09/14/21 ?1440  09/14/21 ?1741 09/15/21 ?0104 09/15/21 ?0843  ?TROPONINIHS 41* 120* 325* 305*  ?   ?Chemistry ?Recent Labs  ?Lab 09/14/21 ?1440 09/15/21 ?0104 09/16/21 ?0425  ?NA 143 141 142  ?K 4.7 4.1 4.4  ?CL 108 107 109  ?CO2 '24 25 27  '$ ?GLUCOSE 226* 222* 68*  ?BUN 30* 31* 36*  ?CREATININE 1.57* 1.57* 1.35*  ?CALCIUM 10.3 9.4 9.5  ?MG  --   --  2.7*  ?PROT 7.2  --   --   ?ALBUMIN 3.8  --   --   ?AST 36  --   --   ?ALT 27  --   --   ?ALKPHOS 69  --   --   ?BILITOT 0.7  --   --   ?GFRNONAA 34* 34* 41*  ?ANIONGAP '11 9 6  '$ ?  ?Lipids No results for input(s): CHOL, TRIG, HDL, LABVLDL, LDLCALC, CHOLHDL in the last 168 hours.  ?Hematology ?Recent Labs  ?Lab 09/14/21 ?1440 09/15/21 ?0104 09/16/21 ?0425  ?WBC 4.8 3.9* 3.2*  ?RBC 4.78 4.04 3.66*  ?HGB 13.3 11.3* 10.5*  ?HCT 38.8 32.3* 29.8*  ?MCV 81.2 80.0 81.4  ?MCH 27.8 28.0 28.7  ?MCHC 34.3  35.0 35.2  ?RDW 15.7* 15.5 15.8*  ?PLT 184 158 151  ? ?Thyroid No results for input(s): TSH, FREET4 in the last 168 hours.  ?BNPNo results for input(s): BNP, PROBNP in the last 168 hours.  ?DDimer No results for input(s): DDIMER in the last 168 hours.  ? ?Radiology  ?  ?CT ABDOMEN PELVIS W CONTRAST ? ?Result Date: 09/14/2021 ?CLINICAL DATA:  Nausea vomiting and abdominal pain EXAM: CT ABDOMEN AND PELVIS WITH CONTRAST TECHNIQUE: Multidetector CT imaging of the abdomen and pelvis was performed using the standard protocol following bolus administration of intravenous contrast. RADIATION DOSE REDUCTION: This exam was performed according to the departmental dose-optimization program which includes automated exposure control, adjustment of the mA and/or kV according to patient size and/or use of iterative reconstruction technique. CONTRAST:  26m OMNIPAQUE IOHEXOL 300 MG/ML  SOLN COMPARISON:  CT abdomen and pelvis dated September 15, 2012 FINDINGS: Lower chest: No acute abnormality. Hepatobiliary: No focal liver abnormality is seen. No gallstones, gallbladder wall thickening, or biliary dilatation. Pancreas:  Unremarkable. No pancreatic ductal dilatation or surrounding inflammatory changes. Spleen: Normal in size without focal abnormality. Adrenals/Urinary Tract: Adrenal glands are unremarkable. Kidneys are normal, without renal calculi, focal lesion, or hydronephrosis. Bladder is unremarkable. Stomach/Bowel: Stomach is within normal limits. Appendix is not visualized. No evidence of bowel wall thickening, distention, or inflammatory changes. Vascular/Lymphatic: Aortic atherosclerosis. No enlarged abdominal or pelvic lymph nodes. Reproductive: Fibroid uterus. Other: Small right greater than left fat containing inguinal hernias. Musculoskeletal: Multilevel moderate degenerative disc disease. No acute or significant osseous findings. IMPRESSION: 1. No acute findings in abdomen or pelvis. 2.  Aortic Atherosclerosis (ICD10-I70.0). Electronically Signed   By: LYetta GlassmanM.D.   On: 09/14/2021 16:17  ? ?DG Chest Portable 1 View ? ?Result Date: 09/14/2021 ?CLINICAL DATA:  Tachycardia. Nausea and vomiting. Upper abdominal pain. EXAM: PORTABLE CHEST 1 VIEW COMPARISON:  08/02/2018 FINDINGS: Mild chronic elevation of right hemidiaphragm. The heart is normal in size. Stable mediastinal contours. Aortic atherosclerosis. Coronary stent is seen. No pulmonary edema, pleural effusion, or pneumothorax. Mild scoliotic curvature of the spine. No acute osseous abnormalities are seen. Normal bowel gas pattern in the upper abdomen. IMPRESSION: No acute chest findings. Electronically Signed   By: MKeith RakeM.D.   On: 09/14/2021 15:46  ? ?ECHOCARDIOGRAM LIMITED ? ?Result Date: 09/15/2021 ?   ECHOCARDIOGRAM LIMITED REPORT   Patient Name:   Cassandra PLOTNERDate of Exam: 09/15/2021 Medical Rec #:  [id number]           Height:       66.0 in Accession #:    [id number]          Weight:       158.1 lb Date of Birth:  4October 21, 1947           BSA:          1.810 m? Patient Age:    89years             BP:           154/55 mmHg Patient Gender:  F                    HR:           59 bpm. [facility]  [facility]cedure: Limited Echo Indications:    Elevated Troponin  History:        Patient has prior history of Echocardiogram examinations, most  recent 08/04/2021. CAD, Arrythmias:Atrial Fibrillation; Risk                 Factors:Hypertension, Diabetes and Dyslipidemia.  Sonographer:    Alvino Chapel RCS Referring Phys: [phone] DAVID TAT IMPRESSIONS  1. Limited echo with limited images, no color doppler  2. Left ventricular ejection fraction, by estimation, is 55 to 60%. The left ventricle has normal function. The left ventricle has no regional wall motion abnormalities. There is moderate eccentric left ventricular hypertrophy of the basal-septal segment. There is moderate hypokinesis of the left ventricular, basal inferior wall. Comparison(s): Prior images reviewed side by side. Changes from prior study are noted. 08/04/2021: LVEF 60-65%, severe assymetric basal septal hypertrophy. Conclusion(s)/Recommendation(s): There is basal inferior wall hypokinesis in this study, however, this was noted in the prior study on my review as well. Overall, no new wall motion abnormalities. FINDINGS  Left Ventricle: Left ventricular ejection fraction, by estimation, is 55 to 60%. The left ventricle has normal function. The left ventricle has no regional wall motion abnormalities. Moderate hypokinesis of the left ventricular, basal inferior wall. There is moderate eccentric left ventricular hypertrophy of the basal-septal segment. LEFT VENTRICLE PLAX 2D LVIDd:         4.95 cm LVIDs:         3.55 cm LV PW:         1.20 cm LV IVS:        1.25 cm LVOT diam:     1.70 cm LVOT Area:     2.27 cm?  LEFT ATRIUM         Index LA diam:    4.20 cm 2.32 cm/m?   AORTA Ao Root diam: 3.45 cm  SHUNTS Systemic Diam: 1.70 cm Lyman Bishop MD Electronically signed by Lyman Bishop MD Signature Date/Time: 09/15/2021/4:28:46 PM    Final    ? ?Cardiac Studies  ? ?Echo 08/04/21 ? 1. Left  ventricular ejection fraction, by estimation, is 60 to 65%. The  ?left ventricle has normal function. The left ventricle demonstrates  ?regional wall motion abnormalities (see scoring diagram/findings

## 2021-09-29 ENCOUNTER — Other Ambulatory Visit: Payer: Self-pay | Admitting: Internal Medicine

## 2021-10-06 DIAGNOSIS — N183 Chronic kidney disease, stage 3 unspecified: Secondary | ICD-10-CM | POA: Diagnosis not present

## 2021-10-06 DIAGNOSIS — I2583 Coronary atherosclerosis due to lipid rich plaque: Secondary | ICD-10-CM | POA: Diagnosis not present

## 2021-10-06 DIAGNOSIS — E1129 Type 2 diabetes mellitus with other diabetic kidney complication: Secondary | ICD-10-CM | POA: Diagnosis not present

## 2021-10-06 DIAGNOSIS — I4891 Unspecified atrial fibrillation: Secondary | ICD-10-CM | POA: Diagnosis not present

## 2021-10-06 DIAGNOSIS — E663 Overweight: Secondary | ICD-10-CM | POA: Diagnosis not present

## 2021-10-06 DIAGNOSIS — I214 Non-ST elevation (NSTEMI) myocardial infarction: Secondary | ICD-10-CM | POA: Diagnosis not present

## 2021-10-06 DIAGNOSIS — E210 Primary hyperparathyroidism: Secondary | ICD-10-CM | POA: Diagnosis not present

## 2021-10-07 DIAGNOSIS — I739 Peripheral vascular disease, unspecified: Secondary | ICD-10-CM | POA: Diagnosis not present

## 2021-10-07 DIAGNOSIS — E1151 Type 2 diabetes mellitus with diabetic peripheral angiopathy without gangrene: Secondary | ICD-10-CM | POA: Diagnosis not present

## 2021-10-07 DIAGNOSIS — L97512 Non-pressure chronic ulcer of other part of right foot with fat layer exposed: Secondary | ICD-10-CM | POA: Diagnosis not present

## 2021-10-13 ENCOUNTER — Emergency Department (HOSPITAL_COMMUNITY)
Admission: EM | Admit: 2021-10-13 | Discharge: 2021-10-13 | Disposition: A | Discharge status: Home / Self Care | Payer: Medicare HMO | Attending: Emergency Medicine | Admitting: Emergency Medicine

## 2021-10-13 ENCOUNTER — Encounter (HOSPITAL_COMMUNITY): Payer: Self-pay

## 2021-10-13 ENCOUNTER — Other Ambulatory Visit: Payer: Self-pay

## 2021-10-13 DIAGNOSIS — I10 Essential (primary) hypertension: Secondary | ICD-10-CM | POA: Diagnosis not present

## 2021-10-13 DIAGNOSIS — E1165 Type 2 diabetes mellitus with hyperglycemia: Secondary | ICD-10-CM | POA: Diagnosis not present

## 2021-10-13 DIAGNOSIS — R739 Hyperglycemia, unspecified: Secondary | ICD-10-CM

## 2021-10-13 DIAGNOSIS — I2510 Atherosclerotic heart disease of native coronary artery without angina pectoris: Secondary | ICD-10-CM | POA: Diagnosis not present

## 2021-10-13 DIAGNOSIS — I130 Hypertensive heart and chronic kidney disease with heart failure and stage 1 through stage 4 chronic kidney disease, or unspecified chronic kidney disease: Secondary | ICD-10-CM | POA: Insufficient documentation

## 2021-10-13 DIAGNOSIS — I501 Left ventricular failure: Secondary | ICD-10-CM | POA: Insufficient documentation

## 2021-10-13 DIAGNOSIS — N183 Chronic kidney disease, stage 3 unspecified: Secondary | ICD-10-CM | POA: Diagnosis not present

## 2021-10-13 DIAGNOSIS — Z743 Need for continuous supervision: Secondary | ICD-10-CM | POA: Diagnosis not present

## 2021-10-13 DIAGNOSIS — M79605 Pain in left leg: Secondary | ICD-10-CM | POA: Diagnosis not present

## 2021-10-13 DIAGNOSIS — R52 Pain, unspecified: Secondary | ICD-10-CM | POA: Diagnosis not present

## 2021-10-13 LAB — URINALYSIS, ROUTINE W REFLEX MICROSCOPIC
Bilirubin Urine: NEGATIVE
Glucose, UA: >=500 mg/dL — AB
Ketones, ur: NEGATIVE mg/dL
Leukocytes,Ua: NEGATIVE
Nitrite: NEGATIVE
Protein, ur: NEGATIVE mg/dL
Specific Gravity, Urine: 1.014 (ref 1.005–1.030)
pH: 6 (ref 5.0–8.0)

## 2021-10-13 LAB — CBC WITH DIFFERENTIAL/PLATELET
Abs Immature Granulocytes: 0.01 10*3/uL (ref 0.00–0.07)
Basophils Absolute: 0 10*3/uL (ref 0.0–0.1)
Basophils Relative: 1 %
Eosinophils Absolute: 0.1 10*3/uL (ref 0.0–0.5)
Eosinophils Relative: 3 %
HCT: 33 % — ABNORMAL LOW (ref 36.0–46.0)
Hemoglobin: 11.4 g/dL — ABNORMAL LOW (ref 12.0–15.0)
Immature Granulocytes: 0 %
Lymphocytes Relative: 28 %
Lymphs Abs: 0.9 10*3/uL (ref 0.7–4.0)
MCH: 27.6 pg (ref 26.0–34.0)
MCHC: 34.5 g/dL (ref 30.0–36.0)
MCV: 79.9 fL — ABNORMAL LOW (ref 80.0–100.0)
Monocytes Absolute: 0.3 10*3/uL (ref 0.1–1.0)
Monocytes Relative: 9 %
Neutro Abs: 1.9 10*3/uL (ref 1.7–7.7)
Neutrophils Relative %: 59 %
Platelets: 155 10*3/uL (ref 150–400)
RBC: 4.13 MIL/uL (ref 3.87–5.11)
RDW: 15.7 % — ABNORMAL HIGH (ref 11.5–15.5)
WBC: 3.2 10*3/uL — ABNORMAL LOW (ref 4.0–10.5)
nRBC: 0 % (ref 0.0–0.2)

## 2021-10-13 LAB — COMPREHENSIVE METABOLIC PANEL
ALT: 27 U/L (ref 0–44)
AST: 34 U/L (ref 15–41)
Albumin: 3.8 g/dL (ref 3.5–5.0)
Alkaline Phosphatase: 85 U/L (ref 38–126)
Anion gap: 9 (ref 5–15)
BUN: 39 mg/dL — ABNORMAL HIGH (ref 8–23)
CO2: 21 mmol/L — ABNORMAL LOW (ref 22–32)
Calcium: 9.7 mg/dL (ref 8.9–10.3)
Chloride: 103 mmol/L (ref 98–111)
Creatinine, Ser: 1.62 mg/dL — ABNORMAL HIGH (ref 0.44–1.00)
GFR, Estimated: 33 mL/min — ABNORMAL LOW (ref >60)
Glucose, Bld: 566 mg/dL (ref 70–99)
Potassium: 4.6 mmol/L (ref 3.5–5.1)
Sodium: 133 mmol/L — ABNORMAL LOW (ref 135–145)
Total Bilirubin: 0.5 mg/dL (ref 0.3–1.2)
Total Protein: 7.1 g/dL (ref 6.5–8.1)

## 2021-10-13 LAB — BLOOD GAS, VENOUS
Acid-base deficit: 4.4 mmol/L — ABNORMAL HIGH (ref 0.0–2.0)
Bicarbonate: 22.1 mmol/L (ref 20.0–28.0)
Drawn by: 7012
FIO2: 21 %
O2 Saturation: 55.4 %
Patient temperature: 36.6
pCO2, Ven: 44 mmHg (ref 44–60)
pH, Ven: 7.31 (ref 7.25–7.43)
pO2, Ven: <31 mmHg — CL (ref 32–45)

## 2021-10-13 LAB — BETA-HYDROXYBUTYRIC ACID: Beta-Hydroxybutyric Acid: 0.89 mmol/L — ABNORMAL HIGH (ref 0.05–0.27)

## 2021-10-13 LAB — CBG MONITORING, ED
Glucose-Capillary: 522 mg/dL (ref 70–99)
Glucose-Capillary: 219 mg/dL — ABNORMAL HIGH (ref 70–99)

## 2021-10-13 MED ORDER — SODIUM CHLORIDE 0.9 % IV BOLUS
500.0000 mL | Freq: Once | INTRAVENOUS | Status: AC
  Administered 2021-10-13: 500 mL via INTRAVENOUS

## 2021-10-13 MED ORDER — INSULIN ASPART 100 UNIT/ML IV SOLN
10.0000 [IU] | Freq: Once | INTRAVENOUS | Status: AC
  Administered 2021-10-13: 10 [IU] via INTRAVENOUS

## 2021-10-13 MED ORDER — ACETAMINOPHEN 500 MG PO TABS
1000.0000 mg | ORAL_TABLET | Freq: Once | ORAL | Status: AC
  Administered 2021-10-13: 1000 mg via ORAL
  Filled 2021-10-13: qty 2

## 2021-10-13 NOTE — ED Notes (Addendum)
900 mL, emptied canister and placed back for suction ?

## 2021-10-13 NOTE — ED Notes (Signed)
Pharmacy bringing down insulin novolog to the ED ?

## 2021-10-13 NOTE — ED Provider Notes (Signed)
? ?Emergency Department Provider Note ? ? ?I have reviewed the triage vital signs and the nursing notes. ? ? ?HISTORY ? ?Chief Complaint ?Hyperglycemia ? ? ?HPI ?Cassandra Adams is a 76 y.o. female with past medical history reviewed below including insulin-dependent diabetes presents with hyperglycemia.  Patient has had elevated blood sugars throughout the week but denies any other symptoms such as weakness, abdominal pain, shortness of breath, vomiting/diarrhea.  States overall she is feeling okay.  She woke up this morning without eating or taking any insulin and noticed her blood sugars were over 600.  She called her PCP who referred her to the emergency department.  Patient arrives by EMS. No fever or chills. Patient denies any recent changes to insulin dosing.  ? ? ?Past Medical History:  ?Diagnosis Date  ? (HFpEF) heart failure with preserved ejection fraction ([place])   ? Limited Echo 7/22: Mild asymmetrical LVH due to scar and thinning of basal posterior wall and background conc LVH, apical and mid segments hyperdynamic, EF 60-65, basal inf-lat and basal inf AK, normal RVSF, RVSP 26.6, mild LAE, mild-moderate MR, mild AV sclerosis w/o AS // Echo 5/22: EF 55-60, RVSP 27, mild to mod LAE, poss small to mod eff; AV sclerosis no AS  ? Arthritis   ? "all over"  ? CAD (coronary artery disease)   ? a. NSTEMI 12/2013 - occluded dominant RCA with L-R collaterals, moderate prox segmental LAD disease, for medical therapy initially, EF 60% with subtle inferobasal hypokinesia. Consider PCI for refractory CP.  ? CKD (chronic kidney disease), stage III ([place])   ? Hypertension   ? Migraines   ? "weekly" (01/06/2014)  ? Sickle cell trait ([place])   ? TIA (transient ischemic attack) 1978  ? Type II diabetes mellitus ([place])   ? Vertigo   ? ? ?Review of Systems ? ?Constitutional: No fever/chills ?Cardiovascular: Denies chest pain. ?Respiratory: Denies shortness of breath. ?Gastrointestinal: No abdominal pain.  No nausea, no  vomiting.  No diarrhea.  No constipation. ?Genitourinary: Negative for dysuria. ?Musculoskeletal: Negative for back pain. ?Skin: Negative for rash. ?Neurological: Negative for headaches, focal weakness or numbness. ? ? ?____________________________________________ ? ? ?PHYSICAL EXAM: ? ?VITAL SIGNS: ?ED Triage Vitals  ?Enc Vitals Group  ?   BP 10/13/21 1053 (!) 162/72  ?   Pulse Rate 10/13/21 1053 90  ?   Resp 10/13/21 1053 20  ?   Temp 10/13/21 1053 97.8 ?F (36.6 ?C)  ?   Temp Source 10/13/21 1053 Oral  ?   SpO2 10/13/21 1053 100 %  ?   Weight 10/13/21 1048 158 lb (71.7 kg)  ?   Height 10/13/21 1048 '5\' 6"'$  (1.676 m)  ? ?Constitutional: Alert and oriented. Well appearing and in no acute distress. ?Eyes: Conjunctivae are normal.  ?Head: Atraumatic. ?Nose: No congestion/rhinnorhea. ?Mouth/Throat: Mucous membranes are moist.  ?Neck: No stridor. ?Cardiovascular: Normal rate, regular rhythm. Good peripheral circulation. Grossly normal heart sounds.   ?Respiratory: Normal respiratory effort.  No retractions. Lungs CTAB. ?Gastrointestinal: Soft and nontender. No distention.  ?Musculoskeletal: No lower extremity tenderness nor edema. No gross deformities of extremities. ?Neurologic:  Normal speech and language. No gross focal neurologic deficits are appreciated.  ?Skin:  Skin is warm, dry and intact. No rash noted. ? ?____________________________________________ ?  ?LABS ?(all labs ordered are listed, but only abnormal results are displayed) ? ?Labs Reviewed  ?COMPREHENSIVE METABOLIC PANEL - Abnormal; Notable for the following components:  ?    Result Value  ? Sodium 133 (*)   ?  CO2 21 (*)   ? Glucose, Bld 566 (*)   ? BUN 39 (*)   ? Creatinine, Ser 1.62 (*)   ? GFR, Estimated 33 (*)   ? All other components within normal limits  ?CBC WITH DIFFERENTIAL/PLATELET - Abnormal; Notable for the following components:  ? WBC 3.2 (*)   ? Hemoglobin 11.4 (*)   ? HCT 33.0 (*)   ? MCV 79.9 (*)   ? RDW 15.7 (*)   ? All other components  within normal limits  ?BLOOD GAS, VENOUS - Abnormal; Notable for the following components:  ? pO2, Ven <31 (*)   ? Acid-base deficit 4.4 (*)   ? All other components within normal limits  ?BETA-HYDROXYBUTYRIC ACID - Abnormal; Notable for the following components:  ? Beta-Hydroxybutyric Acid 0.89 (*)   ? All other components within normal limits  ?URINALYSIS, ROUTINE W REFLEX MICROSCOPIC - Abnormal; Notable for the following components:  ? Color, Urine COLORLESS (*)   ? Glucose, UA >=500 (*)   ? Hgb urine dipstick SMALL (*)   ? Bacteria, UA RARE (*)   ? All other components within normal limits  ?CBG MONITORING, ED - Abnormal; Notable for the following components:  ? Glucose-Capillary 522 (*)   ? All other components within normal limits  ?CBG MONITORING, ED - Abnormal; Notable for the following components:  ? Glucose-Capillary 219 (*)   ? All other components within normal limits  ? ? ?____________________________________________ ? ? ?PROCEDURES ? ?Procedure(s) performed:  ? ?Procedures ? ?None  ?____________________________________________ ? ? ?INITIAL IMPRESSION / ASSESSMENT AND PLAN / ED COURSE ? ?Pertinent labs & imaging results that were available during my care of the patient were reviewed by me and considered in my medical decision making (see chart for details). ?  ?This patient is Presenting for Evaluation of hyperglycemia, which does require a range of treatment options, and is a complaint that involves a high risk of morbidity and mortality. ? ?The Differential Diagnoses include hyperglycemia, HHS, DKA, dehydration. ? ?Critical Interventions-  ?  ?Medications  ?sodium chloride 0.9 % bolus 500 mL (0 mLs Intravenous Stopped 10/13/21 1510)  ?insulin aspart (novoLOG) injection 10 Units (10 Units Intravenous Given 10/13/21 1300)  ?acetaminophen (TYLENOL) tablet 1,000 mg (1,000 mg Oral Given 10/13/21 1441)  ? ? ?Reassessment after intervention: CBG decreased. Patient remains asymptomatic.  ? ? ?I decided to review  pertinent External Data, and in summary previous admit for A fib RVR on 09/16/21. ?  ?Clinical Laboratory Tests Ordered, included patient arrives with hyperglycemia without DKA.  Anion gap of 9.  Initial glucose of 566.  VBG shows pH of 7.31.  No ketones on UA.  ? ?Cardiac Monitor Tracing which shows NSR.  ? ? ?Social Determinants of Health Risk patient is a non-smoker.  ? ?Medical Decision Making: Summary:  ?Patient presents emergency department with hyperglycemia noticed on her monitor at home.  She is compliant with her insulin.  She looks very well with largely unremarkable vital signs.  Lab work here is not consistent with DKA.  No confusion or other symptoms to suspect HHS. CBG improved with IVF and insulin here, down to 219 on re-check.  ? ?Reevaluation with update and discussion with patient. Plan to continue insulin and f/u with PCP regarding med adjustments PRN. Discussed ED return precautions.  ? ?Disposition: discharge ? ?____________________________________________ ? ?FINAL CLINICAL IMPRESSION(S) / ED DIAGNOSES ? ?Final diagnoses:  ?Hyperglycemia  ? ? ?Note:  This document was prepared using Set designer  software and may include unintentional dictation errors. ? ?Nanda Quinton, MD, FACEP ?Emergency Medicine ? ?  ?Margette Fast, MD ?10/13/21 1532 ? ?

## 2021-10-13 NOTE — Discharge Instructions (Signed)
You were seen in the ED today with elevated blood sugar. Please continue your insulin and follow up with your primary care doctor regarding your home medications. Please return with any new or worsening symptoms.  ?

## 2021-10-13 NOTE — ED Triage Notes (Signed)
Patient with complaints of hyperglycemia reading high this morning. EMS glucose was 596. ?

## 2021-10-21 ENCOUNTER — Ambulatory Visit: Payer: Medicare HMO | Admitting: Physician Assistant

## 2021-10-21 ENCOUNTER — Encounter: Payer: Self-pay | Admitting: Physician Assistant

## 2021-10-21 VITALS — BP 130/70 | HR 72 | Ht 66.0 in | Wt 163.4 lb

## 2021-10-21 DIAGNOSIS — E785 Hyperlipidemia, unspecified: Secondary | ICD-10-CM

## 2021-10-21 DIAGNOSIS — E119 Type 2 diabetes mellitus without complications: Secondary | ICD-10-CM | POA: Diagnosis not present

## 2021-10-21 DIAGNOSIS — I2510 Atherosclerotic heart disease of native coronary artery without angina pectoris: Secondary | ICD-10-CM

## 2021-10-21 DIAGNOSIS — L97512 Non-pressure chronic ulcer of other part of right foot with fat layer exposed: Secondary | ICD-10-CM | POA: Diagnosis not present

## 2021-10-21 DIAGNOSIS — N183 Chronic kidney disease, stage 3 unspecified: Secondary | ICD-10-CM

## 2021-10-21 DIAGNOSIS — I10 Essential (primary) hypertension: Secondary | ICD-10-CM

## 2021-10-21 DIAGNOSIS — I480 Paroxysmal atrial fibrillation: Secondary | ICD-10-CM | POA: Diagnosis not present

## 2021-10-21 NOTE — Progress Notes (Signed)
?Cardiology Office Note:   ? ?Date:  10/23/2021  ? ?ID:  Cassandra Adams, DOB 09-24-1945, MRN [id number] ? ?PCP:  Cassandra Sites, MD ?  ?[facility] Providers ?Cardiologist:  Cassandra Casino, MD    ? ?Referring MD: Cassandra Sites, MD  ? ?Chief Complaint  ?Patient presents with  ? Follow-up  ?  Seen for Dr. Debara Adams  ? ? ?History of Present Illness:   ? ?Cassandra Adams is a 76 y.o. female with a hx of HTN, HLD, DM2, CAD, PAF, CKD stage III and history of TIA.  Cardiac catheterization performed in July 2015 demonstrated occlusion of mid RCA, moderate LAD lesion with 50% stenosis in the proximal area followed by a 60 to 70% stenosis, normal left main and left circumflex artery, EF 60%.  Given lack of ongoing symptoms and preserved LV function, it was recommended to treat RCA occlusion medically. Carotid Doppler revealed mild 1 to 39% stenosis in the left carotid artery, no evidence of stenosis in the right carotid artery.  1 year repeat study was recommended. She was admitted in early May 2022 after having a fall at home from her recliner.  She suffered nasal contusion and passing out spell.  Maxillofacial CT did not show any fractures however did show a small nasal soft tissue laceration with small air-fluid level in the left maxillary and right sphenoid sinuses.  CT of cervical spine showed progressive severe degenerative disease at C6-7 however no acute fracture.  CT of the head showed no intracranial etiology.  Nosebleed was stopped in the emergency room with nasal packing.  EKG in the ED demonstrated recurrence of atrial fibrillation with RVR.  Eliquis temporarily held however resumed the day after discharge.  There is a questionable occurrence of syncope during the fall, however this was felt to be vasovagal.  Echocardiogram obtained during the hospitalization showed EF 55 to 60%, moderate asymmetric LVH of basal septal segment, RVSP 27 mmHg, possible small to moderate pericardial effusion with no  hemodynamic significance, mild MR. She was readmitted a few days after discharge in May 2022 after developing significant vomiting with coffee-ground emesis.  GI service was consulted during the second admission and patient underwent EGD on 5/16 that showed erosive gastropathy with no stigmata of bleeding.  It was recommended to start Protonix 40 mg twice a day for 12 weeks and also resume Eliquis.  Patient had successful DC cardioversion on 01/14/2021. ? ?More recently, patient was admitted to the hospital in February 2023 with left-sided chest pain.  Blood pressure was low on arrival, troponin elevated.  Cardiac catheterization performed on 08/04/2021 demonstrated total occlusion of her RCA with collaterals, 70% proximal and mid LAD this was treated with atherectomy and stenting in a staged fashion on 08/05/2021.  Postprocedure, patient was placed on combination of Plavix and Eliquis.  She is followed by a cardiologist out of [place] who has been prescribing her Entresto. ? ?Patient was readmitted to the hospital in March 2023 due to upper abdominal and lower chest pain.  She had nausea and vomiting and was hypotensive.  She was also in atrial fibrillation with RVR at the time.  Troponin trended up to 325.  EKG did not show any new ST changes, therefore it was suspected elevations of troponin with demand ischemia in the setting of underlying CAD and A-fib with RVR.  During hospitalization, she converted to sinus rhythm spontaneously.  Imdur was added to her medical regimen.  She was not on any AV nodal blocking  agent given baseline bradycardia. ? ?Patient presents today along with her son.  She denies any recent exertional chest pain or worsening dyspnea.  Compared to last year, she has managed to lose 20 pounds.  She is compliant with Plavix and Eliquis therapy.  She appears to be euvolemic on physical exam.  Recent blood work showed creatinine bumped up slightly, however this was in the setting of blood glucose of  600.  I think she was slightly dry at the time.  I recommend daily fluid intake between 32 to 64 ounces per day.  She is maintaining sinus rhythm based on EKG today.  She can follow-up with Dr. Debara Adams in 3 to 4 months. ? ? ?Past Medical History:  ?Diagnosis Date  ? (HFpEF) heart failure with preserved ejection fraction ([place])   ? Limited Echo 7/22: Mild asymmetrical LVH due to scar and thinning of basal posterior wall and background conc LVH, apical and mid segments hyperdynamic, EF 60-65, basal inf-lat and basal inf AK, normal RVSF, RVSP 26.6, mild LAE, mild-moderate MR, mild AV sclerosis w/o AS // Echo 5/22: EF 55-60, RVSP 27, mild to mod LAE, poss small to mod eff; AV sclerosis no AS  ? Arthritis   ? "all over"  ? CAD (coronary artery disease)   ? a. NSTEMI 12/2013 - occluded dominant RCA with L-R collaterals, moderate prox segmental LAD disease, for medical therapy initially, EF 60% with subtle inferobasal hypokinesia. Consider PCI for refractory CP.  ? CKD (chronic kidney disease), stage III ([place])   ? Hypertension   ? Migraines   ? "weekly" (01/06/2014)  ? Sickle cell trait ([place])   ? TIA (transient ischemic attack) 1978  ? Type II diabetes mellitus ([place])   ? Vertigo   ? ? ?Past Surgical History:  ?Procedure Laterality Date  ? APPENDECTOMY  March 2014  ? had appendix frozen  ? CARDIAC CATHETERIZATION  "years ago" & 01/05/2014  ? CARDIOVERSION N/A 03/17/2014  ? Procedure: CARDIOVERSION;  Surgeon: Cassandra Casino, MD;  Location: [place];  Service: Cardiovascular;  Laterality: N/A;  ? CARDIOVERSION N/A 01/14/2021  ? Procedure: CARDIOVERSION;  Surgeon: Cassandra Casino, MD;  Location: [facility] ENDOSCOPY;  Service: Cardiovascular;  Laterality: N/A;  ? CATARACT EXTRACTION W/ INTRAOCULAR LENS  IMPLANT, BILATERAL Bilateral 04/2013-05/2013  ? CORONARY ATHERECTOMY N/A 08/05/2021  ? Procedure: CORONARY ATHERECTOMY;  Surgeon: Sherren Mocha, MD;  Location: [place] CV LAB;  Service: Cardiovascular;  Laterality: N/A;  ? CORONARY  STENT INTERVENTION N/A 08/05/2021  ? Procedure: CORONARY STENT INTERVENTION;  Surgeon: Sherren Mocha, MD;  Location: [place] CV LAB;  Service: Cardiovascular;  Laterality: N/A;  ? ENDARTERECTOMY Right 02/05/2017  ? Procedure: ENDARTERECTOMY CAROTID-RIGHT;  Surgeon: [name], MD;  Location: [facility] OR;  Service: Vascular;  Laterality: Right;  ? ESOPHAGOGASTRODUODENOSCOPY  11/08/2007  ?  Normal esophagus without evidence of Barrett, mass, erosion/ Normal stomach, duodenal bulb  ? ESOPHAGOGASTRODUODENOSCOPY (EGD) WITH PROPOFOL N/A 11/08/2020  ? Procedure: ESOPHAGOGASTRODUODENOSCOPY (EGD) WITH PROPOFOL;  Surgeon: Thornton Park, MD;  Location: [place];  Service: Gastroenterology;  Laterality: N/A;  ? GASTRIC MOTILITY STUDY  11/13/2007  ? mildly delayed emptying subjectively, but normal  analysis-77% of tracer emptied at 2 hours  ? INTRAVASCULAR PRESSURE WIRE/FFR STUDY N/A 08/04/2021  ? Procedure: INTRAVASCULAR PRESSURE WIRE/FFR STUDY;  Surgeon: Nelva Bush, MD;  Location: [place] CV LAB;  Service: Cardiovascular;  Laterality: N/A;  ? JOINT REPLACEMENT    ? LAPAROSCOPIC APPENDECTOMY N/A 09/15/2012  ? Procedure: APPENDECTOMY LAPAROSCOPIC;  Surgeon: [name], MD;  [facility];  Service: General;  Laterality: N/A;  ? LEFT HEART CATH AND CORONARY ANGIOGRAPHY N/A 08/04/2021  ? Procedure: LEFT HEART CATH AND CORONARY ANGIOGRAPHY;  Surgeon: Nelva Bush, MD;  Location: [place] CV LAB;  Service: Cardiovascular;  Laterality: N/A;  ? LEFT HEART CATHETERIZATION WITH CORONARY ANGIOGRAM N/A 01/05/2014  ? Procedure: LEFT HEART CATHETERIZATION WITH CORONARY ANGIOGRAM;  Surgeon: [name], MD;  Location: [facility] CATH LAB;  Service: Cardiovascular;  Laterality: N/A;  ? REPLACEMENT TOTAL KNEE Right 05-03-06  ? TEE WITHOUT CARDIOVERSION N/A 03/17/2014  ? Procedure: TRANSESOPHAGEAL ECHOCARDIOGRAM (TEE);  Surgeon: Cassandra Casino, MD;  Location: [facility] ENDOSCOPY;  Service: Cardiovascular;  Laterality: N/A;   ? [place]  ? ? ?Current Medications: ?Current Meds  ?Medication Sig  ? atorvastatin (LIPITOR) 80 MG tablet Take 1 tablet (80 mg total) by mouth every evening.  ? B-D ULTRAFINE III SHORT PEN 31G X 8

## 2021-10-21 NOTE — Patient Instructions (Signed)
Medication Instructions:  ?Your physician recommends that you continue on your current medications as directed. Please refer to the Current Medication list given to you today. ? ?*If you need a refill on your cardiac medications before your next appointment, please call your pharmacy* ? ? ?Lab Work: ?Not ordered ?If you have labs (blood work) drawn today and your tests are completely normal, you will receive your results only by: ?MyChart Message (if you have MyChart) OR ?A paper copy in the mail ?If you have any lab test that is abnormal or we need to change your treatment, we will call you to review the results. ? ? ?Testing/Procedures: ?Not ordered  ? ? ?Follow-Up: ?At [facility], you and your health needs are our priority.  As part of our continuing mission to provide you with exceptional heart care, we have created designated Provider Care Teams.  These Care Teams include your primary Cardiologist (physician) and Advanced Practice Providers (APPs -  Physician Assistants and Nurse Practitioners) who all work together to provide you with the care you need, when you need it. ? ?We recommend signing up for the patient portal called "MyChart".  Sign up information is provided on this After Visit Summary.  MyChart is used to connect with patients for Virtual Visits (Telemedicine).  Patients are able to view lab/test results, encounter notes, upcoming appointments, etc.  Non-urgent messages can be sent to your provider as well.   ?To learn more about what you can do with MyChart, go to [email or web address].   ? ?Your next appointment:   ?3-4 month(s) ? ?The format for your next appointment:   ?In Person ? ?Provider:   ?[name], MD   ? ? ?Other Instructions ? ? ?Important Information About Sugar ? ? ? ? ? ? ?

## 2021-10-23 ENCOUNTER — Encounter: Payer: Self-pay | Admitting: Physician Assistant

## 2021-11-04 DIAGNOSIS — L97512 Non-pressure chronic ulcer of other part of right foot with fat layer exposed: Secondary | ICD-10-CM | POA: Diagnosis not present

## 2021-11-15 DIAGNOSIS — I5032 Chronic diastolic (congestive) heart failure: Secondary | ICD-10-CM | POA: Diagnosis not present

## 2021-11-15 DIAGNOSIS — I509 Heart failure, unspecified: Secondary | ICD-10-CM | POA: Diagnosis not present

## 2021-11-18 DIAGNOSIS — L97511 Non-pressure chronic ulcer of other part of right foot limited to breakdown of skin: Secondary | ICD-10-CM | POA: Diagnosis not present

## 2021-12-02 DIAGNOSIS — L97511 Non-pressure chronic ulcer of other part of right foot limited to breakdown of skin: Secondary | ICD-10-CM | POA: Diagnosis not present

## 2021-12-08 ENCOUNTER — Encounter (HOSPITAL_COMMUNITY): Payer: Self-pay

## 2021-12-08 ENCOUNTER — Inpatient Hospital Stay (HOSPITAL_COMMUNITY)
Admission: EM | Admit: 2021-12-08 | Discharge: 2021-12-15 | DRG: 309 | Disposition: A | Discharge status: Home / Self Care | Payer: Medicare HMO | Attending: Family Medicine | Admitting: Family Medicine

## 2021-12-08 DIAGNOSIS — I2583 Coronary atherosclerosis due to lipid rich plaque: Secondary | ICD-10-CM | POA: Diagnosis present

## 2021-12-08 DIAGNOSIS — Z880 Allergy status to penicillin: Secondary | ICD-10-CM

## 2021-12-08 DIAGNOSIS — R519 Headache, unspecified: Secondary | ICD-10-CM | POA: Diagnosis not present

## 2021-12-08 DIAGNOSIS — Z794 Long term (current) use of insulin: Secondary | ICD-10-CM

## 2021-12-08 DIAGNOSIS — E875 Hyperkalemia: Secondary | ICD-10-CM | POA: Diagnosis not present

## 2021-12-08 DIAGNOSIS — Z83438 Family history of other disorder of lipoprotein metabolism and other lipidemia: Secondary | ICD-10-CM

## 2021-12-08 DIAGNOSIS — N184 Chronic kidney disease, stage 4 (severe): Secondary | ICD-10-CM

## 2021-12-08 DIAGNOSIS — N179 Acute kidney failure, unspecified: Secondary | ICD-10-CM | POA: Diagnosis present

## 2021-12-08 DIAGNOSIS — I2510 Atherosclerotic heart disease of native coronary artery without angina pectoris: Secondary | ICD-10-CM | POA: Diagnosis present

## 2021-12-08 DIAGNOSIS — D72819 Decreased white blood cell count, unspecified: Secondary | ICD-10-CM | POA: Diagnosis present

## 2021-12-08 DIAGNOSIS — I4891 Unspecified atrial fibrillation: Secondary | ICD-10-CM | POA: Diagnosis present

## 2021-12-08 DIAGNOSIS — Z7901 Long term (current) use of anticoagulants: Secondary | ICD-10-CM

## 2021-12-08 DIAGNOSIS — Z96651 Presence of right artificial knee joint: Secondary | ICD-10-CM | POA: Diagnosis present

## 2021-12-08 DIAGNOSIS — D631 Anemia in chronic kidney disease: Secondary | ICD-10-CM | POA: Diagnosis present

## 2021-12-08 DIAGNOSIS — Z7984 Long term (current) use of oral hypoglycemic drugs: Secondary | ICD-10-CM

## 2021-12-08 DIAGNOSIS — I4892 Unspecified atrial flutter: Secondary | ICD-10-CM | POA: Diagnosis present

## 2021-12-08 DIAGNOSIS — I4819 Other persistent atrial fibrillation: Secondary | ICD-10-CM | POA: Diagnosis not present

## 2021-12-08 DIAGNOSIS — E1165 Type 2 diabetes mellitus with hyperglycemia: Secondary | ICD-10-CM | POA: Diagnosis present

## 2021-12-08 DIAGNOSIS — Z8249 Family history of ischemic heart disease and other diseases of the circulatory system: Secondary | ICD-10-CM

## 2021-12-08 DIAGNOSIS — I10 Essential (primary) hypertension: Secondary | ICD-10-CM | POA: Diagnosis present

## 2021-12-08 DIAGNOSIS — I252 Old myocardial infarction: Secondary | ICD-10-CM

## 2021-12-08 DIAGNOSIS — E1122 Type 2 diabetes mellitus with diabetic chronic kidney disease: Secondary | ICD-10-CM | POA: Diagnosis present

## 2021-12-08 DIAGNOSIS — E871 Hypo-osmolality and hyponatremia: Secondary | ICD-10-CM | POA: Diagnosis not present

## 2021-12-08 DIAGNOSIS — I480 Paroxysmal atrial fibrillation: Secondary | ICD-10-CM | POA: Diagnosis not present

## 2021-12-08 DIAGNOSIS — Z79899 Other long term (current) drug therapy: Secondary | ICD-10-CM

## 2021-12-08 DIAGNOSIS — R110 Nausea: Secondary | ICD-10-CM | POA: Diagnosis not present

## 2021-12-08 DIAGNOSIS — R42 Dizziness and giddiness: Secondary | ICD-10-CM | POA: Diagnosis not present

## 2021-12-08 DIAGNOSIS — Z7902 Long term (current) use of antithrombotics/antiplatelets: Secondary | ICD-10-CM

## 2021-12-08 DIAGNOSIS — E782 Mixed hyperlipidemia: Secondary | ICD-10-CM | POA: Diagnosis present

## 2021-12-08 DIAGNOSIS — I130 Hypertensive heart and chronic kidney disease with heart failure and stage 1 through stage 4 chronic kidney disease, or unspecified chronic kidney disease: Secondary | ICD-10-CM | POA: Diagnosis present

## 2021-12-08 DIAGNOSIS — Z833 Family history of diabetes mellitus: Secondary | ICD-10-CM

## 2021-12-08 DIAGNOSIS — D573 Sickle-cell trait: Secondary | ICD-10-CM | POA: Diagnosis present

## 2021-12-08 DIAGNOSIS — I5032 Chronic diastolic (congestive) heart failure: Secondary | ICD-10-CM | POA: Diagnosis present

## 2021-12-08 DIAGNOSIS — Z955 Presence of coronary angioplasty implant and graft: Secondary | ICD-10-CM

## 2021-12-08 DIAGNOSIS — N1832 Chronic kidney disease, stage 3b: Secondary | ICD-10-CM | POA: Diagnosis present

## 2021-12-08 DIAGNOSIS — Z8673 Personal history of transient ischemic attack (TIA), and cerebral infarction without residual deficits: Secondary | ICD-10-CM

## 2021-12-08 LAB — URINALYSIS, ROUTINE W REFLEX MICROSCOPIC
Bilirubin Urine: NEGATIVE
Glucose, UA: >=500 mg/dL — AB
Hgb urine dipstick: NEGATIVE
Ketones, ur: NEGATIVE mg/dL
Leukocytes,Ua: NEGATIVE
Nitrite: NEGATIVE
Protein, ur: NEGATIVE mg/dL
Specific Gravity, Urine: 1.023 (ref 1.005–1.030)
pH: 5 (ref 5.0–8.0)

## 2021-12-08 LAB — COMPREHENSIVE METABOLIC PANEL
ALT: 17 U/L (ref 0–44)
AST: 31 U/L (ref 15–41)
Albumin: 3.4 g/dL — ABNORMAL LOW (ref 3.5–5.0)
Alkaline Phosphatase: 54 U/L (ref 38–126)
Anion gap: 8 (ref 5–15)
BUN: 38 mg/dL — ABNORMAL HIGH (ref 8–23)
CO2: 25 mmol/L (ref 22–32)
Calcium: 9.9 mg/dL (ref 8.9–10.3)
Chloride: 108 mmol/L (ref 98–111)
Creatinine, Ser: 1.84 mg/dL — ABNORMAL HIGH (ref 0.44–1.00)
GFR, Estimated: 28 mL/min — ABNORMAL LOW (ref >60)
Glucose, Bld: 197 mg/dL — ABNORMAL HIGH (ref 70–99)
Potassium: 4.9 mmol/L (ref 3.5–5.1)
Sodium: 141 mmol/L (ref 135–145)
Total Bilirubin: 0.7 mg/dL (ref 0.3–1.2)
Total Protein: 6.2 g/dL — ABNORMAL LOW (ref 6.5–8.1)

## 2021-12-08 LAB — CBC WITH DIFFERENTIAL/PLATELET
Abs Immature Granulocytes: 0 10*3/uL (ref 0.00–0.07)
Basophils Absolute: 0 10*3/uL (ref 0.0–0.1)
Basophils Relative: 1 %
Eosinophils Absolute: 0.1 10*3/uL (ref 0.0–0.5)
Eosinophils Relative: 2 %
HCT: 34.2 % — ABNORMAL LOW (ref 36.0–46.0)
Hemoglobin: 11.9 g/dL — ABNORMAL LOW (ref 12.0–15.0)
Immature Granulocytes: 0 %
Lymphocytes Relative: 38 %
Lymphs Abs: 1.4 10*3/uL (ref 0.7–4.0)
MCH: 28.1 pg (ref 26.0–34.0)
MCHC: 34.8 g/dL (ref 30.0–36.0)
MCV: 80.7 fL (ref 80.0–100.0)
Monocytes Absolute: 0.3 10*3/uL (ref 0.1–1.0)
Monocytes Relative: 9 %
Neutro Abs: 1.8 10*3/uL (ref 1.7–7.7)
Neutrophils Relative %: 50 %
Platelets: 184 10*3/uL (ref 150–400)
RBC: 4.24 MIL/uL (ref 3.87–5.11)
RDW: 15.4 % (ref 11.5–15.5)
WBC: 3.6 10*3/uL — ABNORMAL LOW (ref 4.0–10.5)
nRBC: 0 % (ref 0.0–0.2)

## 2021-12-08 LAB — TROPONIN I (HIGH SENSITIVITY): Troponin I (High Sensitivity): 29 ng/L — ABNORMAL HIGH (ref <18)

## 2021-12-08 NOTE — ED Triage Notes (Signed)
Pt states that she has been having dizziness and nausea all day, denies CP, pt states she feels like she is going to pass out

## 2021-12-08 NOTE — ED Provider Triage Note (Signed)
Emergency Medicine Provider Triage Evaluation Note  Cassandra Adams , a 76 y.o. female  was evaluated in triage.  Pt complains of dizziness and elevated blood pressure today  Review of Systems  Positive:  Negative:   Physical Exam  BP 124/69   Pulse (!) 135   Temp 98.4 F (36.9 C) (Oral)   Resp 20   SpO2 98%  Gen:   Awake, no distress   Resp:  Normal effort  MSK:   Moves extremities without difficulty  Other:    Medical Decision Making  Medically screening exam initiated at 9:00 PM.  Appropriate orders placed.  Cassandra Adams was informed that the remainder of the evaluation will be completed by another provider, this initial triage assessment does not replace that evaluation, and the importance of remaining in the ED until their evaluation is complete.     Fransico Meadow, [place] 12/08/21 2101

## 2021-12-09 ENCOUNTER — Emergency Department (HOSPITAL_COMMUNITY): Payer: Medicare HMO

## 2021-12-09 ENCOUNTER — Encounter (HOSPITAL_COMMUNITY): Payer: Self-pay | Admitting: Internal Medicine

## 2021-12-09 DIAGNOSIS — I480 Paroxysmal atrial fibrillation: Secondary | ICD-10-CM | POA: Diagnosis not present

## 2021-12-09 DIAGNOSIS — I4891 Unspecified atrial fibrillation: Secondary | ICD-10-CM

## 2021-12-09 DIAGNOSIS — I4892 Unspecified atrial flutter: Secondary | ICD-10-CM

## 2021-12-09 DIAGNOSIS — R110 Nausea: Secondary | ICD-10-CM | POA: Diagnosis not present

## 2021-12-09 DIAGNOSIS — R42 Dizziness and giddiness: Secondary | ICD-10-CM | POA: Diagnosis not present

## 2021-12-09 LAB — CBG MONITORING, ED: Glucose-Capillary: 265 mg/dL — ABNORMAL HIGH (ref 70–99)

## 2021-12-09 LAB — TROPONIN I (HIGH SENSITIVITY): Troponin I (High Sensitivity): 27 ng/L — ABNORMAL HIGH (ref <18)

## 2021-12-09 LAB — TSH: TSH: 0.436 u[IU]/mL (ref 0.350–4.500)

## 2021-12-09 LAB — GLUCOSE, CAPILLARY
Glucose-Capillary: 365 mg/dL — ABNORMAL HIGH (ref 70–99)
Glucose-Capillary: 268 mg/dL — ABNORMAL HIGH (ref 70–99)

## 2021-12-09 MED ORDER — CLOPIDOGREL BISULFATE 75 MG PO TABS
75.0000 mg | ORAL_TABLET | Freq: Every day | ORAL | Status: DC
  Administered 2021-12-09 – 2021-12-15 (×7): 75 mg via ORAL
  Filled 2021-12-09 (×8): qty 1

## 2021-12-09 MED ORDER — ACETAMINOPHEN 325 MG PO TABS
650.0000 mg | ORAL_TABLET | Freq: Four times a day (QID) | ORAL | Status: DC | PRN
  Administered 2021-12-09 – 2021-12-14 (×6): 650 mg via ORAL
  Filled 2021-12-09 (×6): qty 2

## 2021-12-09 MED ORDER — INSULIN GLARGINE-YFGN 100 UNIT/ML ~~LOC~~ SOLN
25.0000 [IU] | Freq: Every day | SUBCUTANEOUS | Status: DC
Start: 2021-12-09 — End: 2021-12-09
  Filled 2021-12-09: qty 0.25

## 2021-12-09 MED ORDER — INSULIN ASPART 100 UNIT/ML IJ SOLN
0.0000 [IU] | Freq: Three times a day (TID) | INTRAMUSCULAR | Status: DC
  Administered 2021-12-09: 5 [IU] via SUBCUTANEOUS
  Administered 2021-12-09: 9 [IU] via SUBCUTANEOUS
  Administered 2021-12-10: 5 [IU] via SUBCUTANEOUS

## 2021-12-09 MED ORDER — AMIODARONE HCL 200 MG PO TABS
400.0000 mg | ORAL_TABLET | Freq: Two times a day (BID) | ORAL | Status: DC
  Administered 2021-12-09 – 2021-12-12 (×7): 400 mg via ORAL
  Filled 2021-12-09 (×7): qty 2

## 2021-12-09 MED ORDER — ACETAMINOPHEN 650 MG RE SUPP: 650.0000 mg | Freq: Four times a day (QID) | RECTAL | Status: DC | PRN

## 2021-12-09 MED ORDER — COLESEVELAM HCL 625 MG PO TABS
1875.0000 mg | ORAL_TABLET | Freq: Two times a day (BID) | ORAL | Status: DC
  Administered 2021-12-09 – 2021-12-15 (×13): 1875 mg via ORAL
  Filled 2021-12-09 (×15): qty 3

## 2021-12-09 MED ORDER — SACUBITRIL-VALSARTAN 49-51 MG PO TABS: 1.0000 | ORAL_TABLET | Freq: Two times a day (BID) | ORAL | Status: DC

## 2021-12-09 MED ORDER — BASAGLAR KWIKPEN 100 UNIT/ML ~~LOC~~ SOPN: 25.0000 [IU] | PEN_INJECTOR | Freq: Every day | SUBCUTANEOUS | Status: DC

## 2021-12-09 MED ORDER — ONDANSETRON HCL 4 MG PO TABS
4.0000 mg | ORAL_TABLET | Freq: Four times a day (QID) | ORAL | Status: DC | PRN
  Administered 2021-12-09 – 2021-12-13 (×2): 4 mg via ORAL
  Filled 2021-12-09 (×2): qty 1

## 2021-12-09 MED ORDER — ATORVASTATIN CALCIUM 80 MG PO TABS
80.0000 mg | ORAL_TABLET | Freq: Every evening | ORAL | Status: DC
  Administered 2021-12-09 – 2021-12-14 (×6): 80 mg via ORAL
  Filled 2021-12-09 (×6): qty 1

## 2021-12-09 MED ORDER — ONDANSETRON HCL 4 MG/2ML IJ SOLN
4.0000 mg | Freq: Four times a day (QID) | INTRAMUSCULAR | Status: DC | PRN
  Administered 2021-12-14: 4 mg via INTRAVENOUS
  Filled 2021-12-09: qty 2

## 2021-12-09 MED ORDER — FUROSEMIDE 40 MG PO TABS
40.0000 mg | ORAL_TABLET | Freq: Every day | ORAL | Status: DC
  Administered 2021-12-09 – 2021-12-10 (×2): 40 mg via ORAL
  Filled 2021-12-09: qty 2
  Filled 2021-12-09: qty 1

## 2021-12-09 MED ORDER — APIXABAN 5 MG PO TABS
5.0000 mg | ORAL_TABLET | Freq: Two times a day (BID) | ORAL | Status: DC
  Administered 2021-12-09 – 2021-12-15 (×13): 5 mg via ORAL
  Filled 2021-12-09 (×14): qty 1

## 2021-12-09 MED ORDER — SPIRONOLACTONE 25 MG PO TABS
25.0000 mg | ORAL_TABLET | Freq: Every day | ORAL | Status: DC
Start: 2021-12-09 — End: 2021-12-10
  Administered 2021-12-09 – 2021-12-10 (×2): 25 mg via ORAL
  Filled 2021-12-09 (×2): qty 1

## 2021-12-09 MED ORDER — SODIUM CHLORIDE 0.9% FLUSH
3.0000 mL | Freq: Two times a day (BID) | INTRAVENOUS | Status: DC
Start: 2021-12-09 — End: 2021-12-15
  Administered 2021-12-09 – 2021-12-15 (×11): 3 mL via INTRAVENOUS

## 2021-12-09 MED ORDER — INSULIN GLARGINE-YFGN 100 UNIT/ML ~~LOC~~ SOLN
10.0000 [IU] | Freq: Every day | SUBCUTANEOUS | Status: DC
Start: 2021-12-09 — End: 2021-12-10
  Administered 2021-12-09: 10 [IU] via SUBCUTANEOUS
  Filled 2021-12-09 (×2): qty 0.1

## 2021-12-09 MED ORDER — HYDRALAZINE HCL 20 MG/ML IJ SOLN: 5.0000 mg | INTRAMUSCULAR | Status: DC | PRN

## 2021-12-09 NOTE — H&P (Signed)
History and Physical    Patient: Cassandra Adams DOB: [birth year] DOA: 12/08/2021 DOS: the patient was seen and examined on 12/09/2021 PCP: Sharilyn Sites, MD  Patient coming from: Home - lives alone; NOK: Orie Rout [place], [place]   Chief Complaint: Dizziness  HPI: Cassandra Adams is a 76 y.o. female with medical history significant of chronic diastolic CHF; difficult to control afib on Eliquis; CAD; stage 3b CKD; HTN; and DM presenting with dizziness.  Her daughter came by yesterday, HR 133.  She came back in the evening and it was 133.  She called cardiology and he told her to come to the ER.  She was dizzy, swimmy headed, and nauseated.  She was having a little CP in radiology and still feels a little dizzy.  No SOB.  She has been on Entresto and is able to tolerate that.  She cannot tell when she is in afib.  Her dizziness started yesterday.  She has been shocked before but it recurred.  There has not been discussion about ablation.    ER Course:  Here with dizziness - feels this way when in afib.  Very sensitive to rate control agents due to bradycardia.  Cardiology consulted, admit to medicine.  Started on home meds - Eliquis, Plavix, Entresto.     Review of Systems: As mentioned in the history of present illness. All other systems reviewed and are negative. Past Medical History:  Diagnosis Date   (HFpEF) heart failure with preserved ejection fraction ([place])    Limited Echo 7/22: Mild asymmetrical LVH due to scar and thinning of basal posterior wall and background conc LVH, apical and mid segments hyperdynamic, EF 60-65, basal inf-lat and basal inf AK, normal RVSF, RVSP 26.6, mild LAE, mild-moderate MR, mild AV sclerosis w/o AS // Echo 5/22: EF 55-60, RVSP 27, mild to mod LAE, poss small to mod eff; AV sclerosis no AS   Arthritis    "all over"   CAD (coronary artery disease)    a. NSTEMI 12/2013 - occluded dominant RCA with L-R collaterals, moderate  prox segmental LAD disease, for medical therapy initially, EF 60% with subtle inferobasal hypokinesia. Consider PCI for refractory CP.   CKD (chronic kidney disease), stage III ([place])    Hypertension    Migraines    "weekly" (01/06/2014)   Sickle cell trait ([place])    TIA (transient ischemic attack) 1978   Type II diabetes mellitus ([place])    Vertigo    Past Surgical History:  Procedure Laterality Date   APPENDECTOMY  March 2014   had appendix frozen   CARDIAC CATHETERIZATION  "years ago" & 01/05/2014   CARDIOVERSION N/A 03/17/2014   Procedure: CARDIOVERSION;  Surgeon: [name], MD;  Location: [place];  Service: Cardiovascular;  Laterality: N/A;   CARDIOVERSION N/A 01/14/2021   Procedure: CARDIOVERSION;  Surgeon: [name], MD;  Location: [facility] ENDOSCOPY;  Service: Cardiovascular;  Laterality: N/A;   CATARACT EXTRACTION W/ INTRAOCULAR LENS  IMPLANT, BILATERAL Bilateral 04/2013-05/2013   CORONARY ATHERECTOMY N/A 08/05/2021   Procedure: CORONARY ATHERECTOMY;  Surgeon: Sherren Mocha, MD;  Location: [place] CV LAB;  Service: Cardiovascular;  Laterality: N/A;   CORONARY STENT INTERVENTION N/A 08/05/2021   Procedure: CORONARY STENT INTERVENTION;  Surgeon: Sherren Mocha, MD;  Location: [place] CV LAB;  Service: Cardiovascular;  Laterality: N/A;   ENDARTERECTOMY Right 02/05/2017   Procedure: ENDARTERECTOMY CAROTID-RIGHT;  Surgeon: [name], MD;  Location: [facility] OR;  Service: Vascular;  Laterality: Right;  ESOPHAGOGASTRODUODENOSCOPY  11/08/2007    Normal esophagus without evidence of Barrett, mass, erosion/ Normal stomach, duodenal bulb   ESOPHAGOGASTRODUODENOSCOPY (EGD) WITH PROPOFOL N/A 11/08/2020   Procedure: ESOPHAGOGASTRODUODENOSCOPY (EGD) WITH PROPOFOL;  Surgeon: Thornton Park, MD;  Location: [place];  Service: Gastroenterology;  Laterality: N/A;   GASTRIC MOTILITY STUDY  11/13/2007   mildly delayed emptying subjectively, but normal  analysis-77% of tracer  emptied at 2 hours   INTRAVASCULAR PRESSURE WIRE/FFR STUDY N/A 08/04/2021   Procedure: INTRAVASCULAR PRESSURE WIRE/FFR STUDY;  Surgeon: Nelva Bush, MD;  Location: [place] CV LAB;  Service: Cardiovascular;  Laterality: N/A;   JOINT REPLACEMENT     LAPAROSCOPIC APPENDECTOMY N/A 09/15/2012   Procedure: APPENDECTOMY LAPAROSCOPIC;  Surgeon: [name], MD;  [facility]: [facility];  Service: General;  Laterality: N/A;   LEFT HEART CATH AND CORONARY ANGIOGRAPHY N/A 08/04/2021   Procedure: LEFT HEART CATH AND CORONARY ANGIOGRAPHY;  Surgeon: Nelva Bush, MD;  Location: [place] CV LAB;  Service: Cardiovascular;  Laterality: N/A;   LEFT HEART CATHETERIZATION WITH CORONARY ANGIOGRAM N/A 01/05/2014   Procedure: LEFT HEART CATHETERIZATION WITH CORONARY ANGIOGRAM;  Surgeon: [name], MD;  Location: [facility] CATH LAB;  Service: Cardiovascular;  Laterality: N/A;   REPLACEMENT TOTAL KNEE Right 05-03-06   TEE WITHOUT CARDIOVERSION N/A 03/17/2014   Procedure: TRANSESOPHAGEAL ECHOCARDIOGRAM (TEE);  Surgeon: [name], MD;  Location[facility] ENDOSCOPY;  Service: Cardiovascular;  Laterality: N/A;   TONSILLECTOMY  1972   Social History:  reports that she has never smoked. She has never used smokeless tobacco. She reports current alcohol use. She reports that she does not use drugs.  Allergies  Allergen Reactions   Penicillins Hives    Has patient had a PCN reaction causing immediate rash, facial/tongue/throat swelling, SOB or lightheadedness with hypotension: no Has patient had a PCN reaction causing severe rash involving mucus membranes or skin necrosis: No  Has patient had a PCN reaction that required hospitalization: no Has patient had a PCN reaction occurring within the last 10 years: no If all of the above answers are "NO", then may proceed with Cephalosporin use.     Family History  Problem Relation Age of Onset   Hyperlipidemia Mother    Hypertension Mother    Heart attack Mother     Cancer Father        lung   Hypertension Sister    Diabetes Brother    Heart disease Brother    Hyperlipidemia Brother    Hypertension Brother    Heart attack Brother    Other Brother        DVT    Prior to Admission medications   Medication Sig Start Date End Date Taking? Authorizing Provider  atorvastatin (LIPITOR) 80 MG tablet Take 1 tablet (80 mg total) by mouth every evening. 12/21/20   Hilty, Nadean Corwin, MD  B-D ULTRAFINE III SHORT PEN 31G X 8 MM MISC SMARTSIG:Injection Daily 07/06/21   [provider]  BD INSULIN SYRINGE U/F 31G X 5/16" 0.5 ML MISC Use to take with Novolog [facility] 01/07/21   Cassandria Anger, MD  clopidogrel (PLAVIX) 75 MG tablet Take 1 tablet (75 mg total) by mouth daily. 09/17/21   Orson Eva, MD  colesevelam [facility]) 625 MG tablet Take 1,875 mg by mouth 2 (two) times daily with a meal. Noon and night    [provider]  Continuous Blood Gluc Receiver (FREESTYLE LIBRE 2 READER) DEVI 1 each by Does not apply route daily in the  afternoon. 06/15/21   Amin, Jeanella Flattery, MD  Continuous Blood Gluc Sensor (FREESTYLE LIBRE 2 SENSOR) MISC 1 each by Does not apply route daily in the afternoon. 06/15/21   Amin, Ankit Chirag, MD  ELIQUIS 5 MG TABS tablet Take 1 tablet (5 mg total) by mouth 2 (two) times daily. 11/07/20   Johnson, Clanford L, MD  ENTRESTO 49-51 MG Take 1 tablet by mouth 2 (two) times daily. 10/11/21   [provider]  furosemide (LASIX) 40 MG tablet Take 40 mg  one tablet by mouth daily , may take an additional 40 mg if needed daily if leg edema worsens 01/10/21   Almyra Deforest, PA  Insulin Glargine (BASAGLAR KWIKPEN) 100 UNIT/ML Inject 10 Units into the skin at bedtime. Patient taking differently: Inject 25 Units into the skin at bedtime. 08/07/21   Terrilee Croak, MD  isosorbide mononitrate (IMDUR) 30 MG 24 hr tablet Take 1 tablet (30 mg total) by mouth daily. 09/17/21   Orson Eva, MD  JARDIANCE 10 MG TABS tablet Take 10 mg by mouth daily.  07/02/21   [provider]  nitroGLYCERIN (NITROSTAT) 0.4 MG SL tablet PLACE 1 TABLET UNDER THE TONGUE EVERY 5 MINUTES AS NEEDED FOR CHEST PAIN (UP TO 3 DOSES) 09/29/21   Hilty, Nadean Corwin, MD  NOVOLOG 100 UNIT/ML injection Inject 6-9 Units into the skin 3 (three) times daily with meals. Sliding Scale 07/28/20   [provider]  [facility] ULTRA test strip TESTING 3 TIMES DAILY 06/20/21   [provider]  polyethylene glycol (MIRALAX / GLYCOLAX) 17 g packet Take 17 g by mouth daily. 11/12/20   Dessa Phi, DO  Vitamin D, Cholecalciferol, 25 MCG (1000 UT) TABS Take 2,000 Units by mouth daily.    [provider]    Physical Exam: Vitals:   12/09/21 1400 12/09/21 1500 12/09/21 1530 12/09/21 1600  BP: (!) 130/101 132/73 (!) 137/54   Pulse: 88 (!) 112 76 77  Resp:      Temp:      TempSrc:      SpO2: 100% 96% 92% 97%  Weight:    75.4 kg   General:  Appears calm and comfortable and is in NAD Eyes:  PERRL, EOMI, normal lids, iris ENT:  grossly normal hearing, lips & tongue, mmm Neck:  no LAD, masses or thyromegaly Cardiovascular: Irregularly irregular with tachycardia to 120s, no m/r/g. Tr LE edema.  Respiratory:   CTA bilaterally with no wheezes/rales/rhonchi.  Normal respiratory effort. Abdomen:  soft, NT, ND Skin:  no rash or induration seen on limited exam Musculoskeletal:  grossly normal tone BUE/BLE, good ROM, no bony abnormality Psychiatric:  grossly normal mood and affect, speech fluent and appropriate, AOx3 Neurologic:  CN 2-12 grossly intact, moves all extremities in coordinated fashion   Radiological Exams on Admission: Independently reviewed - see discussion in A/P where applicable  DG Chest 2 View  Result Date: 12/09/2021 CLINICAL DATA:  Dizziness, nausea EXAM: CHEST - 2 VIEW COMPARISON:  Chest radiograph 09/14/2021 FINDINGS: The cardiomediastinal silhouette is stable. There is calcified atherosclerotic plaque of the aortic arch. There is  unchanged asymmetric elevation of the right hemidiaphragm. There is no focal consolidation or pulmonary edema. There is no pleural effusion or pneumothorax. There is no acute osseous abnormality. IMPRESSION: Stable chest with no radiographic evidence of acute cardiopulmonary process. Electronically Signed   By: Valetta Mole M.D.   On: 12/09/2021 08:02    EKG: Independently reviewed.  Aflutter with 2:1 block with rate 114;  no evidence of acute ischemia   Labs on Admission: I have personally reviewed the available labs and imaging studies at the time of the admission.  Pertinent labs:    Glucose 197 BUN 38/Creatinine 1.84/GFR 28 - stable HS troponin 29, 27 WBC 3.6 Hgb 11.9 UA >500 glucose   Assessment and Plan: Principal Problem:   Atrial fibrillation with RVR (HCC) Active Problems:   Essential hypertension   Coronary artery disease due to lipid rich plaque   Uncontrolled type 2 diabetes mellitus with hyperglycemia (HCC)   Stage 3b chronic kidney disease (CKD) (HCC)   Chronic diastolic CHF (congestive heart failure) (HCC)   Mixed hyperlipidemia    Afib -Patient presenting with symptomatic afib - dizziness, generalized weakness -She has had difficulty with rate-controlling medications and is not currently on medications -Will plan to place in observation status on telemetry -Awaiting cardiology input about treatment plan - anti-arrhythmic vs. DCCV vs. ablation -HS troponin negative; low suspicion for ACS -Continue Eliquis  CAD -s/p DES in February -Continue Plavix  HTN -Not currently on BP medications -Will also add prn hydralazine  HLD -Continue Lipitor, Welchol  DM -Last A1c was 8.1, suboptimal control but likely ok given her age and comorbidities -Continue Glargine -Hold Jardiance -Will cover with sensitive-scale SSI for now  Chronic systolic CHF -EF had normalized on last echo in 08/2021 -Hold Entresto for now - renal function is slightly worse than usual  baseline -Continue spironolactone - has not yet increased dose from 25 to 50  Stage 3b CKD -Stable but slightly worse than usual baseline      Advance Care Planning:   Code Status: Full Code   Consults: Cardiology  DVT Prophylaxis: Eliquis  Family Communication: Sister was present throughout evaluation  Severity of Illness: The appropriate patient status for this patient is OBSERVATION. Observation status is judged to be reasonable and necessary in order to provide the required intensity of service to ensure the patient's safety. The patient's presenting symptoms, physical exam findings, and initial radiographic and laboratory data in the context of their medical condition is felt to place them at decreased risk for further clinical deterioration. Furthermore, it is anticipated that the patient will be medically stable for discharge from the hospital within 2 midnights of admission.   Author: Karmen Bongo, MD 12/09/2021 6:16 PM  For on call review [email or web address].

## 2021-12-09 NOTE — ED Provider Notes (Signed)
[facility] EMERGENCY DEPARTMENT Provider Note   CSN: [id number] Arrival date & time: 12/08/21  2031     History  Chief Complaint  Patient presents with  . Dizziness    Cassandra Adams is a 76 y.o. female.  The history is provided by the patient and a relative.  Dizziness Quality:  Lightheadedness Severity:  Moderate Onset quality:  Gradual Timing:  Intermittent Progression:  Improving Chronicity:  New Worsened by:  Nothing Associated symptoms: no chest pain, no shortness of breath, no syncope and no weakness   Risk factors: heart disease    Patient reports that she is going in and out of atrial fibrillation and this is caused her to feel lightheaded.  She reports feeling lightheaded as well as elevated blood pressure.  No syncope.  No chest pain or shortness of breath She reports med compliance No focal weakness.  No slurred speech  Home Medications Prior to Admission medications   Medication Sig Start Date End Date Taking? Authorizing Provider  atorvastatin (LIPITOR) 80 MG tablet Take 1 tablet (80 mg total) by mouth every evening. 12/21/20   Hilty, Nadean Corwin, MD  B-D ULTRAFINE III SHORT PEN 31G X 8 MM MISC SMARTSIG:Injection Daily 07/06/21   [provider]  BD INSULIN SYRINGE U/F 31G X 5/16" 0.5 ML MISC Use to take with Novolog [facility] 01/07/21   Cassandria Anger, MD  clopidogrel (PLAVIX) 75 MG tablet Take 1 tablet (75 mg total) by mouth daily. 09/17/21   Orson Eva, MD  colesevelam [facility]) 625 MG tablet Take 1,875 mg by mouth 2 (two) times daily with a meal. Noon and night    [provider]  Continuous Blood Gluc Receiver (FREESTYLE LIBRE 2 READER) DEVI 1 each by Does not apply route daily in the afternoon. 06/15/21   Amin, Jeanella Flattery, MD  Continuous Blood Gluc Sensor (FREESTYLE LIBRE 2 SENSOR) MISC 1 each by Does not apply route daily in the afternoon. 06/15/21   Amin, Ankit Chirag, MD  ELIQUIS 5 MG TABS tablet Take 1 tablet  (5 mg total) by mouth 2 (two) times daily. 11/07/20   Johnson, Clanford L, MD  ENTRESTO 49-51 MG Take 1 tablet by mouth 2 (two) times daily. 10/11/21   [provider]  furosemide (LASIX) 40 MG tablet Take 40 mg  one tablet by mouth daily , may take an additional 40 mg if needed daily if leg edema worsens 01/10/21   Almyra Deforest, PA  Insulin Glargine (BASAGLAR KWIKPEN) 100 UNIT/ML Inject 10 Units into the skin at bedtime. Patient taking differently: Inject 25 Units into the skin at bedtime. 08/07/21   Terrilee Croak, MD  isosorbide mononitrate (IMDUR) 30 MG 24 hr tablet Take 1 tablet (30 mg total) by mouth daily. 09/17/21   Orson Eva, MD  JARDIANCE 10 MG TABS tablet Take 10 mg by mouth daily. 07/02/21   [provider]  nitroGLYCERIN (NITROSTAT) 0.4 MG SL tablet PLACE 1 TABLET UNDER THE TONGUE EVERY 5 MINUTES AS NEEDED FOR CHEST PAIN (UP TO 3 DOSES) 09/29/21   Hilty, Nadean Corwin, MD  NOVOLOG 100 UNIT/ML injection Inject 6-9 Units into the skin 3 (three) times daily with meals. Sliding Scale 07/28/20   [provider]  [facility] ULTRA test strip TESTING 3 TIMES DAILY 06/20/21   [provider]  polyethylene glycol (MIRALAX / GLYCOLAX) 17 g packet Take 17 g by mouth daily. 11/12/20   Dessa Phi, DO  Vitamin D, Cholecalciferol, 25 MCG (1000 UT)  TABS Take 2,000 Units by mouth daily.    [provider]      Allergies    Penicillins    Review of Systems   Review of Systems  Respiratory:  Negative for shortness of breath.   Cardiovascular:  Negative for chest pain and syncope.  Neurological:  Positive for dizziness. Negative for speech difficulty and weakness.    Physical Exam Updated Vital Signs BP 137/65 (BP Location: Right Arm)   Pulse 92   Temp 97.8 F (36.6 C) (Oral)   Resp 18   SpO2 99%  Physical Exam CONSTITUTIONAL: Well developed/well nourished HEAD: Normocephalic/atraumatic EYES: EOMI/PERRL, no nystagmus,  no ptosis ENMT: Mucous membranes  moist NECK: supple no meningeal signs CV: Irregular, no loud murmur LUNGS: Lungs are clear to auscultation bilaterally, no apparent distress ABDOMEN: soft, nontender, no rebound or guarding GU:no cva tenderness NEURO:Awake/alert, face symmetric, no arm or leg drift is noted Equal 5/5 strength with shoulder abduction, elbow flex/extension, wrist flex/extension in upper extremities Equal 5/5 strength with hip flexion,knee flex/extension, foot dorsi/plantar flexion Cranial nerves 3/4/5/6/01/01/09/11/12 tested and intact Sensation to light touch intact in all extremities EXTREMITIES: pulses normal, full ROM SKIN: warm, color normal PSYCH: no abnormalities of mood noted   ED Results / Procedures / Treatments   Labs (all labs ordered are listed, but only abnormal results are displayed) Labs Reviewed  CBC WITH DIFFERENTIAL/PLATELET - Abnormal; Notable for the following components:      Result Value   WBC 3.6 (*)    Hemoglobin 11.9 (*)    HCT 34.2 (*)    All other components within normal limits  COMPREHENSIVE METABOLIC PANEL - Abnormal; Notable for the following components:   Glucose, Bld 197 (*)    BUN 38 (*)    Creatinine, Ser 1.84 (*)    Total Protein 6.2 (*)    Albumin 3.4 (*)    GFR, Estimated 28 (*)    All other components within normal limits  URINALYSIS, ROUTINE W REFLEX MICROSCOPIC - Abnormal; Notable for the following components:   APPearance CLOUDY (*)    Glucose, UA >=500 (*)    Bacteria, UA RARE (*)    All other components within normal limits  TROPONIN I (HIGH SENSITIVITY) - Abnormal; Notable for the following components:   Troponin I (High Sensitivity) 29 (*)    All other components within normal limits  TROPONIN I (HIGH SENSITIVITY) - Abnormal; Notable for the following components:   Troponin I (High Sensitivity) 27 (*)    All other components within normal limits    EKG EKG Interpretation  Date/Time:  Thursday December 08 2021 20:35:34 EDT Ventricular Rate:   134 PR Interval:  224 QRS Duration: 88 QT Interval:  248 QTC Calculation: 370 R Axis:   -6 Text Interpretation: Sinus tachycardia with 1st degree A-V block Nonspecific T wave abnormality Abnormal ECG Confirmed by Ripley Fraise [id number]) on 12/09/2021 5:29:38 AM   EKG Interpretation  Date/Time:  Friday December 09 2021 07:15:13 EDT Ventricular Rate:  114 PR Interval:  224 QRS Duration: 80 QT Interval:  343 QTC Calculation: 473 R Axis:   6 Text Interpretation: Atrial flutter with predominant 2:1 AV block Nonspecific T abnormalities, lateral leads Confirmed by Ripley Fraise [phone]) on 12/09/2021 7:16:18 AM        Radiology No results found.  Procedures Procedures    Medications Ordered in ED Medications  clopidogrel (PLAVIX) tablet 75 mg (has no administration in time range)  apixaban (ELIQUIS) tablet 5 mg (has  no administration in time range)  sacubitril-valsartan (ENTRESTO) 49-51 mg per tablet (has no administration in time range)    ED Course/ Medical Decision Making/ A&P Clinical Course as of 12/09/21 0751  Fri Dec 09, 2021  0527 Glucose(!): 197 Hyperglycemia [DW]  0527 Creatinine(!): 1.84 Renal insufficiency [DW]  0627 Patient with extensive history including CAD and paroxysmal atrial fibrillation.  Patient feels that when her heart rate is elevated this triggers her symptoms.  She is already on anticoagulation.  Rate control agents have been held previously due to previous bradycardia [DW]  0704 Patient reports she is very sensitive to atrial fibrillation and typically has the symptoms.  She is not a candidate for rate control agents.  She has been cardioverted in the past but she is very high risk given her age and comorbidities.  She does not think she is missed any recent Eliquis dosing.  She has been in the ER for over 12 hours, will start her home meds.  We will consult [DW]  0712 Discussed with on-call APP Sharrell Ku with cardiology.  She requests repeat EKG and  will see in the ER.  Difficult to control A-fib and she is not a candidate for rate control agent [DW]  [phone] Signed out to dr Kathrynn Humble at shift change to f/u with cardiology recs [DW]  [phone] Discussed with Dr. Lorin Mercy for admission [DW]    Clinical Course User Index [DW] Ripley Fraise, MD                           Medical Decision Making Amount and/or Complexity of Data Reviewed Labs:  Decision-making details documented in ED Course. ECG/medicine tests: ordered.  Risk Prescription drug management.   This patient presents to the ED for concern of dizziness, this involves an extensive number of treatment options, and is a complaint that carries with it a high risk of complications and morbidity.  The differential diagnosis includes but is not limited to CVA, electrolyte imbalance, cardiac arrhythmia  Comorbidities that complicate the patient evaluation: Patient's presentation is complicated by their history of obesity, CAD, atrial fibrillation   Additional history obtained: Additional history obtained from family Records reviewed  cardiology notes reviewed  Lab Tests: I Ordered, and personally interpreted labs.  The pertinent results include:  renal insufficiency    Cardiac Monitoring: The patient was maintained on a cardiac monitor.  I personally viewed and interpreted the cardiac monitor which showed an underlying rhythm of:  Atrial Fibrillation  Medicines ordered and prescription drug management: Home medications have been ordered   Consultations Obtained: I requested consultation with the consultant cardiology , and discussed  findings as well as pertinent plan - they recommend: will see patient  Reevaluation: After the interventions noted above, I reevaluated the patient and found that they have :stayed the same  Complexity of problems addressed: Patient's presentation is most consistent with  acute presentation with potential threat to life or bodily function             Final Clinical Impression(s) / ED Diagnoses Final diagnoses:  Atrial fibrillation with rapid ventricular response [facility])    Rx / DC Orders ED Discharge Orders     None         Ripley Fraise, MD 12/09/21 [phone]

## 2021-12-09 NOTE — Consult Note (Addendum)
Cardiology Consultation:   Patient ID: Cassandra Adams MRN: [id number]; DOB: 03-29-1946  Admit date: 12/08/2021 Date of Consult: 12/09/2021  PCP:  Sharilyn Sites, MD   [facility] Providers Cardiologist:  [name], MD        Patient Profile:   Cassandra Adams is a 76 y.o. female with a hx of hypertension, hyperlipidemia, DM2, CAD, PAF, CKD stage III and history of TIA who is being seen 12/09/2021 for the evaluation of atrial flutter with RVR at the request of Dr. Lorin Mercy.  History of Present Illness:   Cassandra Adams is a 76 year old female with past medical history of hypertension, hyperlipidemia, DM2, CAD, PAF, CKD stage III and history of TIA.  Cardiac catheterization performed in July 2015 demonstrated occluded mid RCA, moderate LAD disease with 50% stenosis in the proximal area followed by a 60-70% stenosis, normal left main and left circumflex artery, EF 60%.  Given lack of chest discomfort and preserved LV function, it was recommended to treat RCA occlusion medically.  Carotid Doppler revealed 1 to 39% left ICA stenosis, no disease on the right side.  She was admitted in May 2022 after a fall at home from her recliner.  She was noted to be in atrial fibrillation with RVR in the emergency room.  There was a questionable recurrence of syncope during the fall, however was felt to be vasovagal.  Echocardiogram during the hospitalization showed EF 55 to 60%, moderate asymmetric LVH with basal septal segment, RVSP 27 mmHg, possible small to moderate pericardial effusion with no hemodynamic compromise, mild MR.  She was admitted a few days after discharge in May 2022 after developing significant vomiting with coffee-ground emesis.  GI service was consulted and the patient underwent EGD on 11/08/2020 that showed erosive gastropathy with no stigmata of bleeding.  Eliquis was resumed.  Patient underwent successful DC cardioversion on 01/14/2021.  Patient was readmitted to the hospital  with chest pain in February 2023.  Cardiac catheterization performed on 08/04/2021 demonstrated total occlusion of RCA with collaterals, 70% proximal and mid LAD treated with atherectomy and stenting in a staged fashion on 08/05/2021.  Postprocedure, patient was discharged on combination of Plavix and Eliquis.  She was readmitted in March 2023 due to upper abdominal and lower chest discomfort.  She had nausea, vomiting and was hypotensive.  Patient was noted to be in A-fib with RVR.  Troponin trended up to 325.  Elevation of the troponin was felt to be demand ischemia in the setting of underlying CAD and A-fib with RVR.  She converted to sinus rhythm spontaneously.  Imdur was added to her medical regimen.  She was not placed on AV nodal blocking agent given baseline bradycardia.  I last saw the patient in the office on 10/13/2021 at which time she was doing well.  She was maintaining sinus rhythm at that time.  Patient was in her usual state of health until yesterday lunchtime when she started having increased dizziness and nausea.  Blood pressure on arrival was 137/65.  Pulse 92.  EKG demonstrated atrial flutter with RVR.  Patient was admitted to hospitalist service.  Cardiology consulted for atrial flutter with RVR.   Past Medical History:  Diagnosis Date   (HFpEF) heart failure with preserved ejection fraction ([place])    Limited Echo 7/22: Mild asymmetrical LVH due to scar and thinning of basal posterior wall and background conc LVH, apical and mid segments hyperdynamic, EF 60-65, basal inf-lat and basal inf AK, normal RVSF, RVSP 26.6,  mild LAE, mild-moderate MR, mild AV sclerosis w/o AS // Echo 5/22: EF 55-60, RVSP 27, mild to mod LAE, poss small to mod eff; AV sclerosis no AS   Arthritis    "all over"   CAD (coronary artery disease)    a. NSTEMI 12/2013 - occluded dominant RCA with L-R collaterals, moderate prox segmental LAD disease, for medical therapy initially, EF 60% with subtle inferobasal  hypokinesia. Consider PCI for refractory CP.   CKD (chronic kidney disease), stage III ([place])    Hypertension    Migraines    "weekly" (01/06/2014)   Sickle cell trait ([place])    TIA (transient ischemic attack) 1978   Type II diabetes mellitus ([place])    Vertigo     Past Surgical History:  Procedure Laterality Date   APPENDECTOMY  March 2014   had appendix frozen   CARDIAC CATHETERIZATION  "years ago" & 01/05/2014   CARDIOVERSION N/A 03/17/2014   Procedure: CARDIOVERSION;  Surgeon: [name], MD;  Location: [place];  Service: Cardiovascular;  Laterality: N/A;   CARDIOVERSION N/A 01/14/2021   Procedure: CARDIOVERSION;  Surgeon: [name], MD;  Location: [facility] ENDOSCOPY;  Service: Cardiovascular;  Laterality: N/A;   CATARACT EXTRACTION W/ INTRAOCULAR LENS  IMPLANT, BILATERAL Bilateral 04/2013-05/2013   CORONARY ATHERECTOMY N/A 08/05/2021   Procedure: CORONARY ATHERECTOMY;  Surgeon: Sherren Mocha, MD;  Location: [place] CV LAB;  Service: Cardiovascular;  Laterality: N/A;   CORONARY STENT INTERVENTION N/A 08/05/2021   Procedure: CORONARY STENT INTERVENTION;  Surgeon: Sherren Mocha, MD;  Location: [place] CV LAB;  Service: Cardiovascular;  Laterality: N/A;   ENDARTERECTOMY Right 02/05/2017   Procedure: ENDARTERECTOMY CAROTID-RIGHT;  Surgeon: [name], MD;  Location: [facility] OR;  Service: Vascular;  Laterality: Right;   ESOPHAGOGASTRODUODENOSCOPY  11/08/2007    Normal esophagus without evidence of Barrett, mass, erosion/ Normal stomach, duodenal bulb   ESOPHAGOGASTRODUODENOSCOPY (EGD) WITH PROPOFOL N/A 11/08/2020   Procedure: ESOPHAGOGASTRODUODENOSCOPY (EGD) WITH PROPOFOL;  Surgeon: Thornton Park, MD;  Location: [place];  Service: Gastroenterology;  Laterality: N/A;   GASTRIC MOTILITY STUDY  11/13/2007   mildly delayed emptying subjectively, but normal  analysis-77% of tracer emptied at 2 hours   INTRAVASCULAR PRESSURE WIRE/FFR STUDY N/A 08/04/2021   Procedure:  INTRAVASCULAR PRESSURE WIRE/FFR STUDY;  Surgeon: Nelva Bush, MD;  Location: [place] CV LAB;  Service: Cardiovascular;  Laterality: N/A;   JOINT REPLACEMENT     LAPAROSCOPIC APPENDECTOMY N/A 09/15/2012   Procedure: APPENDECTOMY LAPAROSCOPIC;  Surgeon: [name], MD;  Location: [facility];  Service: General;  Laterality: N/A;   LEFT HEART CATH AND CORONARY ANGIOGRAPHY N/A 08/04/2021   Procedure: LEFT HEART CATH AND CORONARY ANGIOGRAPHY;  Surgeon: Nelva Bush, MD;  Location: [place] CV LAB;  Service: Cardiovascular;  Laterality: N/A;   LEFT HEART CATHETERIZATION WITH CORONARY ANGIOGRAM N/A 01/05/2014   Procedure: LEFT HEART CATHETERIZATION WITH CORONARY ANGIOGRAM;  Surgeon: [name], MD;  Location: [facility] CATH LAB;  Service: Cardiovascular;  Laterality: N/A;   REPLACEMENT TOTAL KNEE Right 05-03-06   TEE WITHOUT CARDIOVERSION N/A 03/17/2014   Procedure: TRANSESOPHAGEAL ECHOCARDIOGRAM (TEE);  Surgeon: [name], MD;  Location: [facility] ENDOSCOPY;  Service: Cardiovascular;  Laterality: N/A;   TONSILLECTOMY  1972     Home Medications:  Prior to Admission medications   Medication Sig Start Date End Date Taking? Authorizing Provider  atorvastatin (LIPITOR) 80 MG tablet Take 1 tablet (80 mg total) by mouth every evening. 12/21/20  Yes Hilty, Nadean Corwin, MD  clopidogrel (  PLAVIX) 75 MG tablet Take 1 tablet (75 mg total) by mouth daily. 09/17/21  Yes Tat, Shanon Brow, MD  colesevelam [facility]) 625 MG tablet Take 1,875 mg by mouth 2 (two) times daily with a meal. Noon and night   Yes [provider]  ELIQUIS 5 MG TABS tablet Take 1 tablet (5 mg total) by mouth 2 (two) times daily. 11/07/20  Yes Johnson, Clanford L, MD  furosemide (LASIX) 40 MG tablet Take 40 mg  one tablet by mouth daily , may take an additional 40 mg if needed daily if leg edema worsens 01/10/21  Yes Barnhart, [place], PA  Insulin Glargine (BASAGLAR KWIKPEN) 100 UNIT/ML Inject 10 Units into the skin at bedtime. Patient taking  differently: Inject 25 Units into the skin at bedtime. 08/07/21  Yes Dahal, Marlowe Aschoff, MD  JARDIANCE 10 MG TABS tablet Take 10 mg by mouth daily. 07/02/21  Yes [provider]  mupirocin ointment (BACTROBAN) 2 % Apply 1 Application topically 2 (two) times daily. Apply to toe 11/09/21  Yes [provider]  nitroGLYCERIN (NITROSTAT) 0.4 MG SL tablet PLACE 1 TABLET UNDER THE TONGUE EVERY 5 MINUTES AS NEEDED FOR CHEST PAIN (UP TO 3 DOSES) Patient taking differently: Place 0.4 mg under the tongue every 5 (five) minutes as needed for chest pain. 09/29/21  Yes Hilty, Nadean Corwin, MD  NOVOLOG 100 UNIT/ML injection Inject 6-9 Units into the skin 3 (three) times daily with meals. Sliding Scale 07/28/20  Yes [provider]  polyethylene glycol (MIRALAX / GLYCOLAX) 17 g packet Take 17 g by mouth daily. Patient taking differently: Take 17 g by mouth daily as needed. 11/12/20  Yes Dessa Phi, DO  sacubitril-valsartan (ENTRESTO) 97-103 MG Take 1 tablet by mouth 2 (two) times daily.   Yes [provider]  spironolactone (ALDACTONE) 25 MG tablet Take 25 mg by mouth daily. 10/22/21  Yes [provider]  Vitamin D, Cholecalciferol, 25 MCG (1000 UT) TABS Take 2,000 Units by mouth daily.   Yes [provider]  B-D ULTRAFINE III SHORT PEN 31G X 8 MM MISC SMARTSIG:Injection Daily 07/06/21   [provider]  BD INSULIN SYRINGE U/F 31G X 5/16" 0.5 ML MISC Use to take with Novolog [facility] 01/07/21   Nida, Marella Chimes, MD  Continuous Blood Gluc Receiver (FREESTYLE LIBRE 2 READER) DEVI 1 each by Does not apply route daily in the afternoon. 06/15/21   Amin, Jeanella Flattery, MD  Continuous Blood Gluc Sensor (FREESTYLE LIBRE 2 SENSOR) MISC 1 each by Does not apply route daily in the afternoon. 06/15/21   Amin, Ankit Chirag, MD  ENTRESTO 49-51 MG Take 1 tablet by mouth 2 (two) times daily. Patient not taking: Reported on 12/09/2021 10/11/21   [provider]  [facility] ULTRA  test strip TESTING 3 TIMES DAILY 06/20/21   [provider]  spironolactone (ALDACTONE) 50 MG tablet Take 50 mg by mouth daily. Patient not taking: Reported on 12/09/2021 12/04/21   [provider]    Inpatient Medications: Scheduled Meds:  apixaban  5 mg Oral BID   atorvastatin  80 mg Oral QPM   clopidogrel  75 mg Oral Daily   colesevelam  1,875 mg Oral BID WC   furosemide  40 mg Oral Daily   insulin aspart  0-9 Units Subcutaneous TID WC   insulin glargine-yfgn  25 Units Subcutaneous QHS   sodium chloride flush  3 mL Intravenous Q12H   spironolactone  25 mg Oral Daily   Continuous Infusions:  PRN  Meds: acetaminophen **OR** acetaminophen, hydrALAZINE, ondansetron **OR** ondansetron (ZOFRAN) IV  Allergies:    Allergies  Allergen Reactions   Penicillins Hives    Has patient had a PCN reaction causing immediate rash, facial/tongue/throat swelling, SOB or lightheadedness with hypotension: no Has patient had a PCN reaction causing severe rash involving mucus membranes or skin necrosis: No  Has patient had a PCN reaction that required hospitalization: no Has patient had a PCN reaction occurring within the last 10 years: no If all of the above answers are "NO", then may proceed with Cephalosporin use.     Social History:   Social History   Socioeconomic History   Marital status: Single    Spouse name: Not on file   Number of children: Not on file   Years of education: Not on file   Highest education level: Not on file  Occupational History   Occupation: retired  Tobacco Use   Smoking status: Never   Smokeless tobacco: Never  Vaping Use   Vaping Use: Never used  Substance and Sexual Activity   Alcohol use: Yes    Comment: rare   Drug use: No   Sexual activity: Not Currently    Birth control/protection: Post-menopausal  Other Topics Concern   Not on file  Social History Narrative   Not on file   Social Determinants of Health   Financial Resource  Strain: Not on file  Food Insecurity: Not on file  Transportation Needs: Not on file  Physical Activity: Not on file  Stress: Not on file  Social Connections: Not on file  Intimate Partner Violence: Not on file    Family History:    Family History  Problem Relation Age of Onset   Hyperlipidemia Mother    Hypertension Mother    Heart attack Mother    Cancer Father        lung   Hypertension Sister    Diabetes Brother    Heart disease Brother    Hyperlipidemia Brother    Hypertension Brother    Heart attack Brother    Other Brother        DVT     ROS:  Please see the history of present illness.   All other ROS reviewed and negative.     Physical Exam/Data:   Vitals:   12/09/21 1215 12/09/21 1253 12/09/21 1330 12/09/21 1400  BP: (!) 117/56  138/84 (!) 130/101  Pulse: 63  89 (!) 122  Resp: 16  15   Temp:  97.9 F (36.6 C) 97.9 F (36.6 C)   TempSrc:  Oral Oral   SpO2: 98%  100% 100%   No intake or output data in the 24 hours ending 12/09/21 1421    10/21/2021    1:52 PM 10/13/2021   10:48 AM 09/14/2021   10:10 PM  Last 3 Weights  Weight (lbs) 163 lb 6.4 oz 158 lb 158 lb 1.1 oz  Weight (kg) 74.118 kg 71.668 kg 71.7 kg     There is no height or weight on file to calculate BMI.  General:  Well nourished, well developed, in no acute distress HEENT: normal Neck: no JVD Vascular: No carotid bruits; Distal pulses 2+ bilaterally Cardiac:  normal S1, S2; RRR; no murmur  Lungs:  clear to auscultation bilaterally, no wheezing, rhonchi or rales  Abd: soft, nontender, no hepatomegaly  Ext: no edema Musculoskeletal:  No deformities, BUE and BLE strength normal and equal Skin: warm and dry  Neuro:  CNs 2-12 intact,  no focal abnormalities noted Psych:  Normal affect   EKG:  The EKG was personally reviewed and demonstrates:  atrial flutter  Telemetry:  Telemetry was personally reviewed and demonstrates:  atrial flutter with HR 110-120s  Relevant CV Studies:  Limited  echo 09/15/2021  1. Limited echo with limited images, no color doppler   2. Left ventricular ejection fraction, by estimation, is 55 to 60%. The  left ventricle has normal function. The left ventricle has no regional  wall motion abnormalities. There is moderate eccentric left ventricular  hypertrophy of the basal-septal  segment. There is moderate hypokinesis of the left ventricular, basal  inferior wall.   Comparison(s): Prior images reviewed side by side. Changes from prior  study are noted. 08/04/2021: LVEF 60-65%, severe assymetric basal septal  hypertrophy.   Conclusion(s)/Recommendation(s): There is basal inferior wall hypokinesis  in this study, however, this was noted in the prior study on my review as  well. Overall, no new wall motion abnormalities.   Laboratory Data:  High Sensitivity Troponin:   Recent Labs  Lab 12/08/21 2107 12/08/21 2348  TROPONINIHS 29* 27*     Chemistry Recent Labs  Lab 12/08/21 2107  NA 141  K 4.9  CL 108  CO2 25  GLUCOSE 197*  BUN 38*  CREATININE 1.84*  CALCIUM 9.9  GFRNONAA 28*  ANIONGAP 8    Recent Labs  Lab 12/08/21 2107  PROT 6.2*  ALBUMIN 3.4*  AST 31  ALT 17  ALKPHOS 54  BILITOT 0.7   Lipids No results for input(s): "CHOL", "TRIG", "HDL", "LABVLDL", "LDLCALC", "CHOLHDL" in the last 168 hours.  Hematology Recent Labs  Lab 12/08/21 2107  WBC 3.6*  RBC 4.24  HGB 11.9*  HCT 34.2*  MCV 80.7  MCH 28.1  MCHC 34.8  RDW 15.4  PLT 184   Thyroid No results for input(s): "TSH", "FREET4" in the last 168 hours.  BNPNo results for input(s): "BNP", "PROBNP" in the last 168 hours.  DDimer No results for input(s): "DDIMER" in the last 168 hours.   Radiology/Studies:  DG Chest 2 View  Result Date: 12/09/2021 CLINICAL DATA:  Dizziness, nausea EXAM: CHEST - 2 VIEW COMPARISON:  Chest radiograph 09/14/2021 FINDINGS: The cardiomediastinal silhouette is stable. There is calcified atherosclerotic plaque of the aortic arch. There  is unchanged asymmetric elevation of the right hemidiaphragm. There is no focal consolidation or pulmonary edema. There is no pleural effusion or pneumothorax. There is no acute osseous abnormality. IMPRESSION: Stable chest with no radiographic evidence of acute cardiopulmonary process. Electronically Signed   By: Valetta Mole M.D.   On: 12/09/2021 08:02     Assessment and Plan:   New atrial flutter with RVR: Patient has a history of PAF, however initial EKG and telemetry shows she is currently in atrial flutter with RVR which is new for her.  She is not on AV nodal blocking agent at home due to previous history of bradycardia (HR in the low 50s after she spontaneously converted in March).  She has been compliant with Eliquis.  Will discuss with MD, potentially place her on cardioversion scheduled for next Tuesday, however given recurrence, we can start loading her on either amiodarone versus Tikosyn (QTc 470).  Since that she has both a flutter and A-fib, ablation will be difficult.   PAF: She underwent cardioversion in July 2022, however had recurrence in March 2023, she was able to spontaneously convert back to sinus rhythm during the previous admission.  She has been compliant with  her Eliquis at home.  CAD: Patient underwent DES to proximal LAD in February 2023.  Continue Plavix  Hypertension  Hyperlipidemia  DM2  CKD stage III  History of TIA   Risk Assessment/Risk Scores:          CHA2DS2-VASc Score = 9   This indicates a 12.2% annual risk of stroke. The patient's score is based upon: CHF History: 1 HTN History: 1 Diabetes History: 1 Stroke History: 2 Vascular Disease History: 1 Age Score: 2 Gender Score: 1         For questions or updates, please contact [place] Please consult [email or web address] for contact info under    Signed, Almyra Deforest, [place]  12/09/2021 2:21 PM  Patient seen and examined with Almyra Deforest PA.  Agree as above, with the following exceptions and  changes as noted below. Pleasant 76 yo female seen for afib. Rate controlled currently and not on AVN blocking agents due to bradycardia in SR.  Gen: NAD, CV: iRRR, no murmurs, Lungs: clear, Abd: soft, Extrem: Warm, well perfused, no edema, Neuro/Psych: alert and oriented x 3, normal mood and affect. All available labs, radiology testing, previous records reviewed. Discussed care in detail with pt, daughter by phone and sister in room. Reasonable to load with amiodarone and attempt cardioversion early next week if she remains in afib. No missed doses of eliquis. She has remained hemodynamically stable.   Elouise Munroe, MD 12/09/21 6:23 PM

## 2021-12-09 NOTE — Inpatient Diabetes Management (Signed)
Inpatient Diabetes Program Recommendations  AACE/ADA: New Consensus Statement on Inpatient Glycemic Control  Target Ranges:  Prepandial:   less than 140 mg/dL      Peak postprandial:   less than 180 mg/dL (1-2 hours)      Critically ill patients:  140 - 180 mg/dL    Latest Reference Range & Units 12/09/21 12:29  Glucose-Capillary 70 - 99 mg/dL 265 (H)    Latest Reference Range & Units 12/08/21 21:07  Glucose 70 - 99 mg/dL 197 (H)   Review of Glycemic Control  Diabetes history: DM2 Outpatient Diabetes medications: Basaglar 25 units QHS, Jardiance 10 mg daily, Novolog 6-9 units TID with meals Current orders for Inpatient glycemic control: Novolog 0-9 units TID with meals, Basaglar 25 units QHS  Inpatient Diabetes Program Recommendations:    Insulin: Please consider discontinuing Basaglar (not on formulary) and ordering Semglee 10 units QHS.  Thanks, Barnie Alderman, RN, MSN, [place] Diabetes Coordinator Inpatient Diabetes Program [phone] (Team Pager from 8am to [place])

## 2021-12-09 NOTE — ED Provider Notes (Signed)
  Physical Exam  BP (!) 146/75   Pulse 72   Temp 97.8 F (36.6 C) (Oral)   Resp 17   SpO2 97%   Physical Exam  Procedures  Procedures  ED Course / MDM   Clinical Course as of 12/09/21 0745  Fri Dec 09, 2021  0527 Glucose(!): 197 Hyperglycemia [DW]  0527 Creatinine(!): 1.84 Renal insufficiency [DW]  0627 Patient with extensive history including CAD and paroxysmal atrial fibrillation.  Patient feels that when her heart rate is elevated this triggers her symptoms.  She is already on anticoagulation.  Rate control agents have been held previously due to previous bradycardia [DW]  0704 Patient reports she is very sensitive to atrial fibrillation and typically has the symptoms.  She is not a candidate for rate control agents.  She has been cardioverted in the past but she is very high risk given her age and comorbidities.  She does not think she is missed any recent Eliquis dosing.  She has been in the ER for over 12 hours, will start her home meds.  We will consult [DW]  0712 Discussed with on-call APP Sharrell Ku with cardiology.  She requests repeat EKG and will see in the ER.  Difficult to control A-fib and she is not a candidate for rate control agent [DW]  [id number] Signed out to dr Kathrynn Humble at shift change to f/u with cardiology recs [DW]    Clinical Course User Index [DW] Ripley Fraise, MD   Medical Decision Making Amount and/or Complexity of Data Reviewed Labs:  Decision-making details documented in ED Course. ECG/medicine tests: ordered.  Risk Prescription drug management.   Admit for symptomatic AF. CC: dizzy and weak.  Cards consulted, request medicine to admit. CXR completed, home meds ordered.        Varney Biles, MD 12/09/21 [phone]

## 2021-12-10 ENCOUNTER — Other Ambulatory Visit: Payer: Self-pay

## 2021-12-10 DIAGNOSIS — I4891 Unspecified atrial fibrillation: Secondary | ICD-10-CM | POA: Diagnosis not present

## 2021-12-10 DIAGNOSIS — D72819 Decreased white blood cell count, unspecified: Secondary | ICD-10-CM | POA: Diagnosis not present

## 2021-12-10 DIAGNOSIS — I10 Essential (primary) hypertension: Secondary | ICD-10-CM | POA: Diagnosis not present

## 2021-12-10 DIAGNOSIS — D573 Sickle-cell trait: Secondary | ICD-10-CM | POA: Diagnosis not present

## 2021-12-10 DIAGNOSIS — Z833 Family history of diabetes mellitus: Secondary | ICD-10-CM | POA: Diagnosis not present

## 2021-12-10 DIAGNOSIS — Z880 Allergy status to penicillin: Secondary | ICD-10-CM | POA: Diagnosis not present

## 2021-12-10 DIAGNOSIS — Z96651 Presence of right artificial knee joint: Secondary | ICD-10-CM | POA: Diagnosis not present

## 2021-12-10 DIAGNOSIS — Z83438 Family history of other disorder of lipoprotein metabolism and other lipidemia: Secondary | ICD-10-CM | POA: Diagnosis not present

## 2021-12-10 DIAGNOSIS — E782 Mixed hyperlipidemia: Secondary | ICD-10-CM | POA: Diagnosis not present

## 2021-12-10 DIAGNOSIS — N179 Acute kidney failure, unspecified: Secondary | ICD-10-CM | POA: Diagnosis not present

## 2021-12-10 DIAGNOSIS — E1165 Type 2 diabetes mellitus with hyperglycemia: Secondary | ICD-10-CM | POA: Diagnosis not present

## 2021-12-10 DIAGNOSIS — I252 Old myocardial infarction: Secondary | ICD-10-CM | POA: Diagnosis not present

## 2021-12-10 DIAGNOSIS — Z79899 Other long term (current) drug therapy: Secondary | ICD-10-CM | POA: Diagnosis not present

## 2021-12-10 DIAGNOSIS — N189 Chronic kidney disease, unspecified: Secondary | ICD-10-CM | POA: Diagnosis not present

## 2021-12-10 DIAGNOSIS — I2583 Coronary atherosclerosis due to lipid rich plaque: Secondary | ICD-10-CM | POA: Diagnosis not present

## 2021-12-10 DIAGNOSIS — I4892 Unspecified atrial flutter: Secondary | ICD-10-CM | POA: Diagnosis not present

## 2021-12-10 DIAGNOSIS — Z955 Presence of coronary angioplasty implant and graft: Secondary | ICD-10-CM | POA: Diagnosis not present

## 2021-12-10 DIAGNOSIS — Z8249 Family history of ischemic heart disease and other diseases of the circulatory system: Secondary | ICD-10-CM | POA: Diagnosis not present

## 2021-12-10 DIAGNOSIS — I480 Paroxysmal atrial fibrillation: Secondary | ICD-10-CM | POA: Diagnosis not present

## 2021-12-10 DIAGNOSIS — E1122 Type 2 diabetes mellitus with diabetic chronic kidney disease: Secondary | ICD-10-CM | POA: Diagnosis not present

## 2021-12-10 DIAGNOSIS — I2510 Atherosclerotic heart disease of native coronary artery without angina pectoris: Secondary | ICD-10-CM | POA: Diagnosis not present

## 2021-12-10 DIAGNOSIS — N1832 Chronic kidney disease, stage 3b: Secondary | ICD-10-CM | POA: Diagnosis not present

## 2021-12-10 DIAGNOSIS — Z8673 Personal history of transient ischemic attack (TIA), and cerebral infarction without residual deficits: Secondary | ICD-10-CM | POA: Diagnosis not present

## 2021-12-10 DIAGNOSIS — I509 Heart failure, unspecified: Secondary | ICD-10-CM | POA: Diagnosis not present

## 2021-12-10 DIAGNOSIS — I4819 Other persistent atrial fibrillation: Secondary | ICD-10-CM | POA: Diagnosis not present

## 2021-12-10 DIAGNOSIS — D631 Anemia in chronic kidney disease: Secondary | ICD-10-CM | POA: Diagnosis not present

## 2021-12-10 DIAGNOSIS — E875 Hyperkalemia: Secondary | ICD-10-CM | POA: Diagnosis not present

## 2021-12-10 DIAGNOSIS — E871 Hypo-osmolality and hyponatremia: Secondary | ICD-10-CM | POA: Diagnosis not present

## 2021-12-10 DIAGNOSIS — I5032 Chronic diastolic (congestive) heart failure: Secondary | ICD-10-CM | POA: Diagnosis not present

## 2021-12-10 DIAGNOSIS — I130 Hypertensive heart and chronic kidney disease with heart failure and stage 1 through stage 4 chronic kidney disease, or unspecified chronic kidney disease: Secondary | ICD-10-CM | POA: Diagnosis not present

## 2021-12-10 LAB — BASIC METABOLIC PANEL
Anion gap: 5 (ref 5–15)
BUN: 40 mg/dL — ABNORMAL HIGH (ref 8–23)
CO2: 23 mmol/L (ref 22–32)
Calcium: 9.5 mg/dL (ref 8.9–10.3)
Chloride: 108 mmol/L (ref 98–111)
Creatinine, Ser: 1.76 mg/dL — ABNORMAL HIGH (ref 0.44–1.00)
GFR, Estimated: 29 mL/min — ABNORMAL LOW (ref >60)
Glucose, Bld: 283 mg/dL — ABNORMAL HIGH (ref 70–99)
Potassium: 4.8 mmol/L (ref 3.5–5.1)
Sodium: 136 mmol/L (ref 135–145)

## 2021-12-10 LAB — CBC
HCT: 32.1 % — ABNORMAL LOW (ref 36.0–46.0)
Hemoglobin: 11.2 g/dL — ABNORMAL LOW (ref 12.0–15.0)
MCH: 27.8 pg (ref 26.0–34.0)
MCHC: 34.9 g/dL (ref 30.0–36.0)
MCV: 79.7 fL — ABNORMAL LOW (ref 80.0–100.0)
Platelets: 153 10*3/uL (ref 150–400)
RBC: 4.03 MIL/uL (ref 3.87–5.11)
RDW: 15.2 % (ref 11.5–15.5)
WBC: 3 10*3/uL — ABNORMAL LOW (ref 4.0–10.5)
nRBC: 0 % (ref 0.0–0.2)

## 2021-12-10 LAB — GLUCOSE, CAPILLARY
Glucose-Capillary: 271 mg/dL — ABNORMAL HIGH (ref 70–99)
Glucose-Capillary: 368 mg/dL — ABNORMAL HIGH (ref 70–99)
Glucose-Capillary: 224 mg/dL — ABNORMAL HIGH (ref 70–99)
Glucose-Capillary: 133 mg/dL — ABNORMAL HIGH (ref 70–99)

## 2021-12-10 MED ORDER — INSULIN GLARGINE-YFGN 100 UNIT/ML ~~LOC~~ SOLN
15.0000 [IU] | Freq: Every day | SUBCUTANEOUS | Status: DC
  Administered 2021-12-10: 15 [IU] via SUBCUTANEOUS
  Filled 2021-12-10 (×2): qty 0.15

## 2021-12-10 MED ORDER — INSULIN ASPART 100 UNIT/ML IJ SOLN
0.0000 [IU] | Freq: Three times a day (TID) | INTRAMUSCULAR | Status: DC
  Administered 2021-12-10: 20 [IU] via SUBCUTANEOUS
  Administered 2021-12-10: 7 [IU] via SUBCUTANEOUS
  Administered 2021-12-11 (×2): 15 [IU] via SUBCUTANEOUS
  Administered 2021-12-12: 3 [IU] via SUBCUTANEOUS
  Administered 2021-12-12: 4 [IU] via SUBCUTANEOUS
  Administered 2021-12-12: 15 [IU] via SUBCUTANEOUS
  Administered 2021-12-13: 11 [IU] via SUBCUTANEOUS
  Administered 2021-12-13 – 2021-12-14 (×2): 3 [IU] via SUBCUTANEOUS
  Administered 2021-12-14: 7 [IU] via SUBCUTANEOUS
  Administered 2021-12-14 – 2021-12-15 (×2): 4 [IU] via SUBCUTANEOUS
  Administered 2021-12-15: 3 [IU] via SUBCUTANEOUS

## 2021-12-10 MED ORDER — INSULIN ASPART 100 UNIT/ML IJ SOLN
0.0000 [IU] | Freq: Every day | INTRAMUSCULAR | Status: DC
  Administered 2021-12-13: 4 [IU] via SUBCUTANEOUS
  Administered 2021-12-14: 3 [IU] via SUBCUTANEOUS

## 2021-12-10 MED ORDER — KETOROLAC TROMETHAMINE 15 MG/ML IJ SOLN
15.0000 mg | Freq: Once | INTRAMUSCULAR | Status: AC
  Administered 2021-12-10: 15 mg via INTRAVENOUS
  Filled 2021-12-10: qty 1

## 2021-12-10 MED ORDER — ASCORBIC ACID 500 MG PO TABS
500.0000 mg | ORAL_TABLET | Freq: Two times a day (BID) | ORAL | Status: DC
  Administered 2021-12-10 – 2021-12-15 (×10): 500 mg via ORAL
  Filled 2021-12-10 (×10): qty 1

## 2021-12-10 MED ORDER — TRAMADOL HCL 50 MG PO TABS
50.0000 mg | ORAL_TABLET | Freq: Two times a day (BID) | ORAL | Status: DC | PRN
  Administered 2021-12-10: 50 mg via ORAL
  Filled 2021-12-10: qty 1

## 2021-12-10 MED ORDER — ADULT MULTIVITAMIN W/MINERALS CH
1.0000 | ORAL_TABLET | Freq: Every day | ORAL | Status: DC
Start: 2021-12-10 — End: 2021-12-15
  Administered 2021-12-10 – 2021-12-15 (×6): 1 via ORAL
  Filled 2021-12-10 (×6): qty 1

## 2021-12-10 MED ORDER — ZINC SULFATE 220 (50 ZN) MG PO CAPS
220.0000 mg | ORAL_CAPSULE | Freq: Every day | ORAL | Status: DC
Start: 2021-12-10 — End: 2021-12-15
  Administered 2021-12-10 – 2021-12-15 (×6): 220 mg via ORAL
  Filled 2021-12-10 (×6): qty 1

## 2021-12-10 MED ORDER — PROSOURCE PLUS PO LIQD
30.0000 mL | Freq: Two times a day (BID) | ORAL | Status: DC
Start: 2021-12-10 — End: 2021-12-15
  Administered 2021-12-10 – 2021-12-15 (×9): 30 mL via ORAL
  Filled 2021-12-10 (×9): qty 30

## 2021-12-10 NOTE — Progress Notes (Signed)
Progress Note  Patient Name: Cassandra Adams Date of Encounter: 12/10/2021  [facility] Cardiologist: [name], MD   Subjective   Remains in atrial fibrillation but heart rate now well controlled.  Inpatient Medications    Scheduled Meds:  amiodarone  400 mg Oral BID   apixaban  5 mg Oral BID   atorvastatin  80 mg Oral QPM   clopidogrel  75 mg Oral Daily   colesevelam  1,875 mg Oral BID WC   furosemide  40 mg Oral Daily   insulin aspart  0-20 Units Subcutaneous TID WC   insulin aspart  0-5 Units Subcutaneous QHS   insulin glargine-yfgn  15 Units Subcutaneous QHS   sodium chloride flush  3 mL Intravenous Q12H   spironolactone  25 mg Oral Daily   Continuous Infusions:  PRN Meds: acetaminophen **OR** acetaminophen, hydrALAZINE, ondansetron **OR** ondansetron (ZOFRAN) IV, traMADol   Vital Signs    Vitals:   12/09/21 1959 12/10/21 0600 12/10/21 0800 12/10/21 1131  BP: (!) 149/58 (!) 104/56 123/70 127/89  Pulse: 64 (!) 55 97 81  Resp: '16 17  15  '$ Temp: 98 F (36.7 C) 98 F (36.7 C)  98 F (36.7 C)  TempSrc: Oral Axillary  Oral  SpO2: 100% 98% 99% 100%  Weight:        Intake/Output Summary (Last 24 hours) at 12/10/2021 1210 Last data filed at 12/10/2021 1132 Gross per 24 hour  Intake 480 ml  Output 800 ml  Net -320 ml      12/09/2021    4:00 PM 10/21/2021    1:52 PM 10/13/2021   10:48 AM  Last 3 Weights  Weight (lbs) 166 lb 3.6 oz 163 lb 6.4 oz 158 lb  Weight (kg) 75.4 kg 74.118 kg 71.668 kg      Telemetry    Atrial fibrillation with controlled ventricular response- Personally Reviewed  ECG    No new EKG to review- Personally Reviewed  Physical Exam   GEN: No acute distress.   Neck: No JVD Cardiac: Irregularly irregular, no murmurs, rubs, or gallops.  Respiratory: Clear to auscultation bilaterally. GI: Soft, nontender, non-distended  MS: No edema; No deformity. Neuro:  Nonfocal  Psych: Normal affect   Labs    High Sensitivity  Troponin:   Recent Labs  Lab 12/08/21 2107 12/08/21 2348  TROPONINIHS 29* 27*      Chemistry Recent Labs  Lab 12/08/21 2107 12/10/21 0129  NA 141 136  K 4.9 4.8  CL 108 108  CO2 25 23  GLUCOSE 197* 283*  BUN 38* 40*  CREATININE 1.84* 1.76*  CALCIUM 9.9 9.5  PROT 6.2*  --   ALBUMIN 3.4*  --   AST 31  --   ALT 17  --   ALKPHOS 54  --   BILITOT 0.7  --   GFRNONAA 28* 29*  ANIONGAP 8 5     Hematology Recent Labs  Lab 12/08/21 2107 12/10/21 0129  WBC 3.6* 3.0*  RBC 4.24 4.03  HGB 11.9* 11.2*  HCT 34.2* 32.1*  MCV 80.7 79.7*  MCH 28.1 27.8  MCHC 34.8 34.9  RDW 15.4 15.2  PLT 184 153    BNPNo results for input(s): "BNP", "PROBNP" in the last 168 hours.   DDimer No results for input(s): "DDIMER" in the last 168 hours.   CHA2DS2-VASc Score = 9  This indicates a 12.2% annual risk of stroke. The patient's score is based upon: CHF History: 1 HTN History: 1 Diabetes  History: 1 Stroke History: 2 Vascular Disease History: 1 Age Score: 2 Gender Score: 1   Radiology    DG Chest 2 View  Result Date: 12/09/2021 CLINICAL DATA:  Dizziness, nausea EXAM: CHEST - 2 VIEW COMPARISON:  Chest radiograph 09/14/2021 FINDINGS: The cardiomediastinal silhouette is stable. There is calcified atherosclerotic plaque of the aortic arch. There is unchanged asymmetric elevation of the right hemidiaphragm. There is no focal consolidation or pulmonary edema. There is no pleural effusion or pneumothorax. There is no acute osseous abnormality. IMPRESSION: Stable chest with no radiographic evidence of acute cardiopulmonary process. Electronically Signed   By: Valetta Mole M.D.   On: 12/09/2021 08:02    Cardiac Studies   2D Echo 08/2021 IMPRESSIONS    1. Limited echo with limited images, no color doppler   2. Left ventricular ejection fraction, by estimation, is 55 to 60%. The  left ventricle has normal function. The left ventricle has no regional  wall motion abnormalities. There is  moderate eccentric left ventricular  hypertrophy of the basal-septal  segment. There is moderate hypokinesis of the left ventricular, basal  inferior wall.   Comparison(s): Prior images reviewed side by side. Changes from prior  study are noted. 08/04/2021: LVEF 60-65%, severe assymetric basal septal  hypertrophy.   Conclusion(s)/Recommendation(s): There is basal inferior wall hypokinesis  in this study, however, this was noted in the prior study on my review as  well. Overall, no new wall motion abnormalities.   Patient Profile     76 y.o. female with a hx of hypertension, hyperlipidemia, DM2, CAD, PAF, CKD stage III and history of TIA who is being seen 12/09/2021 for the evaluation of atrial flutter with RVR at the request of Dr. Lorin Mercy.  Assessment & Plan    New atrial flutter with RVR:  -Patient has a history of PAF, however initial EKG and telemetry shows she is currently in atrial flutter with RVR which is new for her.   -She is now in atrial fibrillation with controlled ventricular response  -she is not on AV nodal blocking agent at home due to previous history of bradycardia (HR in the low 50s after she spontaneously converted in March).   -She has been compliant with Eliquis.  -Continue Eliquis 5 mg twice daily and amiodarone 400 mg twice daily load which started on Friday 6/16   PAF:  -She underwent cardioversion in July 2022, however had recurrence in March 2023, she was able to spontaneously convert back to sinus rhythm during the previous admission.   -She is now back in atrial fibrillation from atrial flutter that she was admitted with  -she has been compliant with her Eliquis at home. -Continue Eliquis 5 mg twice daily and amiodarone 400 mg twice daily load started Friday 6/16 -We will make n.p.o. after midnight on Sunday for cardioversion on Monday if schedule allows -Shared Decision Making/Informed Consent The risks (stroke, cardiac arrhythmias rarely resulting in the  need for a temporary or permanent pacemaker, skin irritation or burns and complications associated with conscious sedation including aspiration, arrhythmia, respiratory failure and death), benefits (restoration of normal sinus rhythm) and alternatives of a direct current cardioversion were explained in detail to Ms. Roberson and she agrees to proceed.    CAD:  -Patient underwent DES to proximal LAD in February 2023 with known chronically occluded RCA with collaterals -She denies any anginal symptoms -Continue Plavix 75 mg daily, atorvastatin 80 mg daily -No aspirin due to DOAC  Chronic diastolic CHF -She appears euvolemic  on exam today  -Admission chest x-ray with no acute disease  -Continue Jardiance 10 mg daily -She was on Entresto  97-103 mg twice dailyat home but this was held on admission due to AKI -We will hold Lasix and spironolactone until renal function normalizes   Hypertension -BP is controlled on exam today -Continue spironolactone 25 mg daily   Hyperlipidemia -LDL goal less than 70 -LDL was 63 and HDL 69 on 01/16/2021 -Continue atorvastatin 80 mg daily   CKD stage IIIb -Serum creatinine slightly worse than her usual baseline -Entresto on hold     I have spent a total of 35 minutes with patient reviewing 2D echo , telemetry, EKGs, labs and examining patient as well as establishing an assessment and plan that was discussed with the patient.  > 50% of time was spent in direct patient care.     For questions or updates, please contact [place] Please consult [email or web address] for contact info under        Signed, Fransico Him, MD  12/10/2021, 12:10 PM

## 2021-12-10 NOTE — Progress Notes (Signed)
Initial Nutrition Assessment  DOCUMENTATION CODES:   Not applicable  INTERVENTION:   -MVI with minerals daily -500 mg vitamin C BID -200 mg zinc sulfate daily x 14 days -30 ml Prosource Plus BID, each supplement provides 100 kcals and 15 grams protein  NUTRITION DIAGNOSIS:   Increased nutrient needs related to wound healing as evidenced by estimated needs.  GOAL:   Patient will meet greater than or equal to 90% of their needs  MONITOR:   PO intake, Supplement acceptance  REASON FOR ASSESSMENT:   Malnutrition Screening Tool    ASSESSMENT:   Pt with medical history significant of chronic diastolic CHF; difficult to control afib on Eliquis; CAD; stage 3b CKD; HTN; and DM presented with dizziness.  She was found to have A-fib with RVR.  Pt admitted with a-fib with RVR.  Reviewed I/O's: -320 ml x 24 hours  UOP: 800 ml x 24 hours  Pt unavailable at time of visit. Attempted to speak with pt via call to hospital room phone, however, unable to reach. RD unable to obtain further nutrition-related history or complete nutrition-focused physical exam at this time.    Pt currently on a heart healthy/ carb modified diet with 1.5 L fluid restriction. Noted meal completions 100%.   Reviewed wt hx; pt has experienced a 3.9% wt loss over the past 6 months, which is not significant for time frame.   Lab Results  Component Value Date   HGBA1C 8.1 (H) 06/11/2021   PTA DM medications are 25 units insulin glargine daily, 6-9 units insulin aspart TID, and 10 mg jardiance daily.   Labs reviewed: CBGS: [phone] (inpatient orders for glycemic control are 0-20 units insulin aspart TID, 0-5 units insulin aspart daily at bedtime, and 15 units insulin glargine-yfgn daily).    Diet Order:   Diet Order             Diet heart healthy/carb modified Room service appropriate? Yes; Fluid consistency: Thin; Fluid restriction: 1500 mL Fluid  Diet effective now                   EDUCATION  NEEDS:   No education needs have been identified at this time  Skin:  Skin Assessment: Skin Integrity Issues: Skin Integrity Issues:: Diabetic Ulcer Diabetic Ulcer: rt toe  Last BM:  Unknown  Height:   Ht Readings from Last 1 Encounters:  10/21/21 '5\' 6"'$  (1.676 m)    Weight:   Wt Readings from Last 1 Encounters:  12/09/21 75.4 kg    Ideal Body Weight:  61.4 kg  BMI:  Body mass index is 26.83 kg/m.  Estimated Nutritional Needs:   Kcal:  1950-2150  Protein:  105-120 grams  Fluid:  > 1.9 L    Loistine Chance, RD, LDN, [place] Registered Dietitian II Certified Diabetes Care and Education Specialist Please refer to [facility] for RD and/or RD on-call/weekend/after hours pager

## 2021-12-10 NOTE — Care Management Obs Status (Signed)
[place] NOTIFICATION   Patient Details  Name: Cassandra Adams MRN: [id number] Date of Birth: [birth year]   Medicare Observation Status Notification Given:  Yes    Norina Buzzard, RN 12/10/2021, 1:11 PM

## 2021-12-10 NOTE — Progress Notes (Addendum)
PROGRESS NOTE    Cassandra Adams  DVV:[id number] DOB: 08-04-1945 DOA: 12/08/2021 PCP: Sharilyn Sites, MD   Brief Narrative:  76 y.o. female with medical history significant of chronic diastolic CHF; difficult to control afib on Eliquis; CAD; stage 3b CKD; HTN; and DM presented with dizziness.  She was found to have A-fib with RVR.  Cardiology was consulted.  Assessment & Plan:   New atrial flutter with RVR/history of PAF -Cardiology following.  Currently intermittently bradycardic and is on oral amiodarone as per cardiology with plans for possible cardioversion next week. -Continue Eliquis  CAD Hyperlipidemia Hypertension -Status post DES in February.  Continue Plavix, WelChol and statin -Blood pressure currently stable.  Continue Lasix and spironolactone  Chronic systolic CHF -Compensated.  EF had normalized on last echo in 08/2021.  Entresto on hold.  Continue Lasix and spironolactone.  Cardiology following.  Strict input and output.  Daily weights.  Fluid restriction.  Diabetes mellitus type 2 with hyperglycemia -Last A1c was 8.1.  Continue CBGs with SSI.  Increase Lantus to 15 units nightly  Chronic kidney disease stage IIIb -Creatinine currently stable.  Monitor  Anemia of chronic disease -From renal failure.  Hemoglobin stable.  Monitor intermittently  Leukopenia Questionable cause.  Monitor intermittently.  DVT prophylaxis: Eliquis Code Status: Full Family Communication: None at bedside Disposition Plan: Status is: Observation The patient will require care spanning > 2 midnights and should be moved to inpatient because: Of need for cardioversion next week by cardiology   Consultants: Cardiology  Procedures: None  Antimicrobials: None   Subjective: Patient seen and examined at bedside.  Denies any current chest pain, dizziness or palpitations.  Complains of some headache and weakness.  No overnight fever or vomiting reported.  Objective: Vitals:    12/09/21 1600 12/09/21 1959 12/10/21 0600 12/10/21 0800  BP:  (!) 149/58 (!) 104/56 123/70  Pulse: 77 64 (!) 55 97  Resp:  16 17   Temp:  98 F (36.7 C) 98 F (36.7 C)   TempSrc:  Oral Axillary   SpO2: 97% 100% 98% 99%  Weight: 75.4 kg       Intake/Output Summary (Last 24 hours) at 12/10/2021 1036 Last data filed at 12/10/2021 0800 Gross per 24 hour  Intake 480 ml  Output --  Net 480 ml   Filed Weights   12/09/21 1600  Weight: 75.4 kg    Examination:  General exam: Appears calm and comfortable.  Currently on room air. Respiratory system: Bilateral decreased breath sounds at bases Cardiovascular system: S1 & S2 heard, mild intermittent bradycardia present Gastrointestinal system: Abdomen is nondistended, soft and nontender. Normal bowel sounds heard. Extremities: No cyanosis, clubbing; trace lower extremity edema Central nervous system: Alert and oriented. No focal neurological deficits. Moving extremities Skin: No rashes, lesions or ulcers Psychiatry: Judgement and insight appear normal. Mood & affect appropriate.     Data Reviewed: I have personally reviewed following labs and imaging studies  CBC: Recent Labs  Lab 12/08/21 2107 12/10/21 0129  WBC 3.6* 3.0*  NEUTROABS 1.8  --   HGB 11.9* 11.2*  HCT 34.2* 32.1*  MCV 80.7 79.7*  PLT 184 626   Basic Metabolic Panel: Recent Labs  Lab 12/08/21 2107 12/10/21 0129  NA 141 136  K 4.9 4.8  CL 108 108  CO2 25 23  GLUCOSE 197* 283*  BUN 38* 40*  CREATININE 1.84* 1.76*  CALCIUM 9.9 9.5   GFR: Estimated Creatinine Clearance: 27.8 mL/min (A) (by C-G formula based on  SCr of 1.76 mg/dL (H)). Liver Function Tests: Recent Labs  Lab 12/08/21 2107  AST 31  ALT 17  ALKPHOS 54  BILITOT 0.7  PROT 6.2*  ALBUMIN 3.4*   No results for input(s): "LIPASE", "AMYLASE" in the last 168 hours. No results for input(s): "AMMONIA" in the last 168 hours. Coagulation Profile: No results for input(s): "INR", "PROTIME" in the  last 168 hours. Cardiac Enzymes: No results for input(s): "CKTOTAL", "CKMB", "CKMBINDEX", "TROPONINI" in the last 168 hours. BNP (last 3 results) No results for input(s): "PROBNP" in the last 8760 hours. HbA1C: No results for input(s): "HGBA1C" in the last 72 hours. CBG: Recent Labs  Lab 12/09/21 1229 12/09/21 1609 12/09/21 2116 12/10/21 0724  GLUCAP 265* 365* 268* 271*   Lipid Profile: No results for input(s): "CHOL", "HDL", "LDLCALC", "TRIG", "CHOLHDL", "LDLDIRECT" in the last 72 hours. Thyroid Function Tests: Recent Labs    12/09/21 1444  TSH 0.436   Anemia Panel: No results for input(s): "VITAMINB12", "FOLATE", "FERRITIN", "TIBC", "IRON", "RETICCTPCT" in the last 72 hours. Sepsis Labs: No results for input(s): "PROCALCITON", "LATICACIDVEN" in the last 168 hours.  No results found for this or any previous visit (from the past 240 hour(s)).       Radiology Studies: DG Chest 2 View  Result Date: 12/09/2021 CLINICAL DATA:  Dizziness, nausea EXAM: CHEST - 2 VIEW COMPARISON:  Chest radiograph 09/14/2021 FINDINGS: The cardiomediastinal silhouette is stable. There is calcified atherosclerotic plaque of the aortic arch. There is unchanged asymmetric elevation of the right hemidiaphragm. There is no focal consolidation or pulmonary edema. There is no pleural effusion or pneumothorax. There is no acute osseous abnormality. IMPRESSION: Stable chest with no radiographic evidence of acute cardiopulmonary process. Electronically Signed   By: Valetta Mole M.D.   On: 12/09/2021 08:02        Scheduled Meds:  amiodarone  400 mg Oral BID   apixaban  5 mg Oral BID   atorvastatin  80 mg Oral QPM   clopidogrel  75 mg Oral Daily   colesevelam  1,875 mg Oral BID WC   furosemide  40 mg Oral Daily   insulin aspart  0-9 Units Subcutaneous TID WC   insulin glargine-yfgn  10 Units Subcutaneous QHS   sodium chloride flush  3 mL Intravenous Q12H   spironolactone  25 mg Oral Daily    Continuous Infusions:        Aline August, MD [facility] 12/10/2021, 10:36 AM

## 2021-12-11 DIAGNOSIS — I4891 Unspecified atrial fibrillation: Secondary | ICD-10-CM | POA: Diagnosis not present

## 2021-12-11 DIAGNOSIS — N1832 Chronic kidney disease, stage 3b: Secondary | ICD-10-CM

## 2021-12-11 DIAGNOSIS — I2583 Coronary atherosclerosis due to lipid rich plaque: Secondary | ICD-10-CM

## 2021-12-11 DIAGNOSIS — E1165 Type 2 diabetes mellitus with hyperglycemia: Secondary | ICD-10-CM

## 2021-12-11 DIAGNOSIS — I2510 Atherosclerotic heart disease of native coronary artery without angina pectoris: Secondary | ICD-10-CM

## 2021-12-11 DIAGNOSIS — I10 Essential (primary) hypertension: Secondary | ICD-10-CM | POA: Diagnosis not present

## 2021-12-11 DIAGNOSIS — I5032 Chronic diastolic (congestive) heart failure: Secondary | ICD-10-CM | POA: Diagnosis not present

## 2021-12-11 LAB — BASIC METABOLIC PANEL
Anion gap: 5 (ref 5–15)
BUN: 44 mg/dL — ABNORMAL HIGH (ref 8–23)
CO2: 27 mmol/L (ref 22–32)
Calcium: 10.2 mg/dL (ref 8.9–10.3)
Chloride: 105 mmol/L (ref 98–111)
Creatinine, Ser: 2.25 mg/dL — ABNORMAL HIGH (ref 0.44–1.00)
GFR, Estimated: 22 mL/min — ABNORMAL LOW (ref >60)
Glucose, Bld: 67 mg/dL — ABNORMAL LOW (ref 70–99)
Potassium: 5.5 mmol/L — ABNORMAL HIGH (ref 3.5–5.1)
Sodium: 137 mmol/L (ref 135–145)

## 2021-12-11 LAB — MAGNESIUM: Magnesium: 2.5 mg/dL — ABNORMAL HIGH (ref 1.7–2.4)

## 2021-12-11 LAB — GLUCOSE, CAPILLARY
Glucose-Capillary: 77 mg/dL (ref 70–99)
Glucose-Capillary: 325 mg/dL — ABNORMAL HIGH (ref 70–99)
Glucose-Capillary: 333 mg/dL — ABNORMAL HIGH (ref 70–99)
Glucose-Capillary: 121 mg/dL — ABNORMAL HIGH (ref 70–99)

## 2021-12-11 MED ORDER — HYDROMORPHONE HCL 1 MG/ML IJ SOLN
0.5000 mg | INTRAMUSCULAR | Status: DC | PRN
  Administered 2021-12-11: 0.5 mg via INTRAVENOUS
  Filled 2021-12-11: qty 1

## 2021-12-11 MED ORDER — INSULIN GLARGINE-YFGN 100 UNIT/ML ~~LOC~~ SOLN
15.0000 [IU] | Freq: Every day | SUBCUTANEOUS | Status: DC
  Administered 2021-12-12 – 2021-12-14 (×3): 15 [IU] via SUBCUTANEOUS
  Filled 2021-12-11 (×4): qty 0.15

## 2021-12-11 MED ORDER — NALOXONE HCL 0.4 MG/ML IJ SOLN: 0.4000 mg | INTRAMUSCULAR | Status: DC | PRN

## 2021-12-11 MED ORDER — INSULIN GLARGINE-YFGN 100 UNIT/ML ~~LOC~~ SOLN
7.0000 [IU] | Freq: Every day | SUBCUTANEOUS | Status: AC
  Administered 2021-12-11: 7 [IU] via SUBCUTANEOUS
  Filled 2021-12-11: qty 0.07

## 2021-12-11 MED ORDER — SODIUM ZIRCONIUM CYCLOSILICATE 10 G PO PACK
10.0000 g | PACK | Freq: Once | ORAL | Status: AC
Start: 2021-12-11 — End: 2021-12-11
  Administered 2021-12-11: 10 g via ORAL
  Filled 2021-12-11: qty 1

## 2021-12-11 NOTE — Progress Notes (Signed)
Progress Note  Patient Name: Cassandra Adams Date of Encounter: 12/11/2021  [facility] Cardiologist: Cassandra Casino, MD   Subjective   Denies any chest pain or SOB.  Remains in atrial fibrillation with controlled ventricular response  Inpatient Medications    Scheduled Meds:  (feeding supplement) PROSource Plus  30 mL Oral BID BM   amiodarone  400 mg Oral BID   apixaban  5 mg Oral BID   vitamin C  500 mg Oral BID   atorvastatin  80 mg Oral QPM   clopidogrel  75 mg Oral Daily   colesevelam  1,875 mg Oral BID WC   insulin aspart  0-20 Units Subcutaneous TID WC   insulin aspart  0-5 Units Subcutaneous QHS   insulin glargine-yfgn  15 Units Subcutaneous QHS   multivitamin with minerals  1 tablet Oral Daily   sodium chloride flush  3 mL Intravenous Q12H   zinc sulfate  220 mg Oral Daily   Continuous Infusions:  PRN Meds: acetaminophen **OR** acetaminophen, hydrALAZINE, ondansetron **OR** ondansetron (ZOFRAN) IV, traMADol   Vital Signs    Vitals:   12/10/21 0800 12/10/21 1131 12/10/21 2045 12/11/21 0841  BP: 123/70 127/89 113/65 125/76  Pulse: 97 81 70 89  Resp:  '15 17 18  '$ Temp:  98 F (36.7 C)  97.8 F (36.6 C)  TempSrc:  Oral  Oral  SpO2: 99% 100% 100%   Weight:        Intake/Output Summary (Last 24 hours) at 12/11/2021 0912 Last data filed at 12/10/2021 2053 Gross per 24 hour  Intake 3 ml  Output 1700 ml  Net -1697 ml       12/09/2021    4:00 PM 10/21/2021    1:52 PM 10/13/2021   10:48 AM  Last 3 Weights  Weight (lbs) 166 lb 3.6 oz 163 lb 6.4 oz 158 lb  Weight (kg) 75.4 kg 74.118 kg 71.668 kg      Telemetry    Atrial fibrillation with controlled Rate- Personally Reviewed  ECG    No new EKG to review- Personally Reviewed  Physical Exam   GEN: Well nourished, well developed in no acute distress HEENT: Normal NECK: No JVD; No carotid bruits LYMPHATICS: No lymphadenopathy CARDIAC: Irregularly irregular, no murmurs, rubs,  gallops RESPIRATORY:  Clear to auscultation without rales, wheezing or rhonchi  ABDOMEN: Soft, non-tender, non-distended MUSCULOSKELETAL:  No edema; No deformity  SKIN: Warm and dry NEUROLOGIC:  Alert and oriented x 3 PSYCHIATRIC:  Normal affect   Labs    High Sensitivity Troponin:   Recent Labs  Lab 12/08/21 2107 12/08/21 2348  TROPONINIHS 29* 27*       Chemistry Recent Labs  Lab 12/08/21 2107 12/10/21 0129 12/11/21 0128  NA 141 136 137  K 4.9 4.8 5.5*  CL 108 108 105  CO2 '25 23 27  '$ GLUCOSE 197* 283* 67*  BUN 38* 40* 44*  CREATININE 1.84* 1.76* 2.25*  CALCIUM 9.9 9.5 10.2  PROT 6.2*  --   --   ALBUMIN 3.4*  --   --   AST 31  --   --   ALT 17  --   --   ALKPHOS 54  --   --   BILITOT 0.7  --   --   GFRNONAA 28* 29* 22*  ANIONGAP '8 5 5      '$ Hematology Recent Labs  Lab 12/08/21 2107 12/10/21 0129  WBC 3.6* 3.0*  RBC 4.24 4.03  HGB 11.9* 11.2*  HCT 34.2* 32.1*  MCV 80.7 79.7*  MCH 28.1 27.8  MCHC 34.8 34.9  RDW 15.4 15.2  PLT 184 153     BNPNo results for input(s): "BNP", "PROBNP" in the last 168 hours.   DDimer No results for input(s): "DDIMER" in the last 168 hours.   CHA2DS2-VASc Score = 9  This indicates a 12.2% annual risk of stroke. The patient's score is based upon: CHF History: 1 HTN History: 1 Diabetes History: 1 Stroke History: 2 Vascular Disease History: 1 Age Score: 2 Gender Score: 1   Radiology    No results found.  Cardiac Studies   2D Echo 08/2021 IMPRESSIONS    1. Limited echo with limited images, no color doppler   2. Left ventricular ejection fraction, by estimation, is 55 to 60%. The  left ventricle has normal function. The left ventricle has no regional  wall motion abnormalities. There is moderate eccentric left ventricular  hypertrophy of the basal-septal  segment. There is moderate hypokinesis of the left ventricular, basal  inferior wall.   Comparison(s): Prior images reviewed side by side. Changes from  prior  study are noted. 08/04/2021: LVEF 60-65%, severe assymetric basal septal  hypertrophy.   Conclusion(s)/Recommendation(s): There is basal inferior wall hypokinesis  in this study, however, this was noted in the prior study on my review as  well. Overall, no new wall motion abnormalities.   Patient Profile     76 y.o. female with a hx of hypertension, hyperlipidemia, DM2, CAD, PAF, CKD stage III and history of TIA who is being seen 12/09/2021 for the evaluation of atrial flutter with RVR at the request of Dr. Lorin Adams.  Assessment & Plan    New atrial flutter with RVR:  -Patient has a history of PAF, however initial EKG and telemetry shows she is currently in atrial flutter with RVR which is new for her.   -She is now in atrial fibrillation with controlled ventricular response  -she is not on AV nodal blocking agent at home due to previous history of bradycardia (HR in the low 50s after she spontaneously converted in March).   -She has been compliant with Eliquis.  -Continue Eliquis 5 mg twice daily and amiodarone 400 mg twice daily load which started on Friday 6/16   PAF:  -She underwent cardioversion in July 2022, however had recurrence in March 2023, she was able to spontaneously convert back to sinus rhythm during the previous admission.   -She is now back in atrial fibrillation from atrial flutter that she was admitted with  -she has been compliant with her Eliquis at home. -Continue Eliquis 5 mg twice daily and amiodarone 400 mg twice daily for a low that started on Friday -She will be n.p.o. after midnight tonight with plans for cardioversion tomorrow -Shared Decision Making/Informed Consent The risks (stroke, cardiac arrhythmias rarely resulting in the need for a temporary or permanent pacemaker, skin irritation or burns and complications associated with conscious sedation including aspiration, arrhythmia, respiratory failure and death), benefits (restoration of normal sinus  rhythm) and alternatives of a direct current cardioversion were explained in detail to Cassandra Adams and she agrees to proceed.    CAD:  -Patient underwent DES to proximal LAD in February 2023 with known chronically occluded RCA with collaterals -She has not had any anginal symptoms -Continue Plavix 75 mg daily and atorvastatin 80 mg daily -No aspirin due to DOAC  Chronic diastolic CHF -She does not appear volume overloaded on exam -Admission chest x-ray with no  acute disease  -Entresto, Lasix and spironolactone are on hold due to AKI -hold Jardiance due to AKI as well   Hypertension -BP stable at 125/76 mmHg -Entresto and spironolactone on hold due to AKI   Hyperlipidemia -LDL goal less than 70 -LDL was 63 and HDL 69 on 01/16/2021 -Continue atorvastatin 80 mg daily   CKD stage IIIb -Serum creatinine is increased from 1.76-2.25 -Continue to hold Entresto and diuretics -follow renal function closely  For questions or updates, please contact [place] Please consult [email or web address] for contact info under        Signed, Fransico Him, MD  12/11/2021, 9:12 AM

## 2021-12-11 NOTE — Progress Notes (Signed)
Msg sent to cardmaster box to review scheduling DCCV tomorrow with endoscopy team (unable to schedule on weekend). Patient will be NPO after midnight so will adjust insulin to 1/2 dose tonight and regular dose starting tomorrow.  If cardioversion ends up being pushed to another day, rounding team will need to adjust future dosing plan.

## 2021-12-11 NOTE — Progress Notes (Signed)
TRH night cross cover note:   I was notified by RN of the patient's request for an additional pain medication for headache, which has been unrelieved with doses of as needed tramadol as well as as needed acetaminophen.  Per my chart review, including review of most recent rounding hospitalist progress note, this is a 76 year old female here with new diagnosis of atrial flutter with RVR.  She has a history of CHF, without report of acute exacerbation, and also history of stage IIIb CKD, with renal function reportedly at baseline.   In the context of headache unrelieved with as needed tramadol and acetaminophen, will try a single dose of IV Toradol.  However, given her history of CHF as well as CKD 3B, will consider further optimization of alternate, non-NSAID agents, if this single dose of IV Toradol is ineffective in improving her headache.      Babs Bertin, DO Hospitalist

## 2021-12-11 NOTE — Progress Notes (Signed)
PROGRESS NOTE    Cassandra Adams  LGX:[id number] DOB: 1945/09/21 DOA: 12/08/2021 PCP: Sharilyn Sites, MD   Brief Narrative:  76 y.o. female with medical history significant of chronic diastolic CHF; difficult to control afib on Eliquis; CAD; stage 3b CKD; HTN; and DM presented with dizziness.  She was found to have A-fib with RVR.  Cardiology was consulted.  Assessment & Plan:   New atrial flutter with RVR/history of PAF -Cardiology following.  Currently intermittently bradycardic and tachycardic  and is on oral amiodarone as per cardiology with plans for possible cardioversion tomorrow -Continue Eliquis  CAD Hyperlipidemia Hypertension -Status post DES in February.  Continue Plavix, WelChol and statin -Blood pressure currently stable.    Chronic systolic CHF -Compensated.  EF had normalized on last echo in 08/2021.  Entresto on hold.  Lasix, spironolactone also held by cardiology on 12/10/2021.  Cardiology following.  Strict input and output.  Daily weights.  Fluid restriction.  Diabetes mellitus type 2 with hyperglycemia -Last A1c was 8.1.  Continue CBGs with SSI.  Continue Lantus  AKI on chronic kidney disease stage IIIb -Creatinine currently stable.  Monitor  Hyperkalemia -Mild.  We will give 1 dose of Lokelma.  Repeat a.m. labs  Anemia of chronic disease -From renal failure.  Hemoglobin stable.  Monitor intermittently  Leukopenia Questionable cause.  Monitor intermittently.  DVT prophylaxis: Eliquis Code Status: Full Family Communication: None at bedside Disposition Plan: Status is: inpatient because: Of need for cardioversion next week by cardiology   Consultants: Cardiology  Procedures: None  Antimicrobials: None   Subjective: Patient seen and examined at bedside.  No worsening shortness of breath, fever or vomiting reported.  Objective: Vitals:   12/10/21 0600 12/10/21 0800 12/10/21 1131 12/10/21 2045  BP: (!) 104/56 123/70 127/89 113/65  Pulse:  (!) 55 97 81 70  Resp: '17  15 17  '$ Temp: 98 F (36.7 C)  98 F (36.7 C)   TempSrc: Axillary  Oral   SpO2: 98% 99% 100% 100%  Weight:        Intake/Output Summary (Last 24 hours) at 12/11/2021 0733 Last data filed at 12/10/2021 2053 Gross per 24 hour  Intake 483 ml  Output 1700 ml  Net -1217 ml    Filed Weights   12/09/21 1600  Weight: 75.4 kg    Examination:  General: On room air.  No distress ENT/neck: No thyromegaly.  JVD is not elevated  respiratory: Decreased breath sounds at bases bilaterally with some crackles; no wheezing  CVS: S1-S2 heard, intermittently bradycardic and tachycardic  abdominal: Soft, nontender, slightly distended; no organomegaly, bowel sounds are heard Extremities: Trace lower extremity edema; no cyanosis  CNS: Awake and alert.  No focal neurologic deficit.  Moves extremities Lymph: No obvious lymphadenopathy Skin: No obvious ecchymosis/lesions  psych: Affect, judgment and mood are normal  musculoskeletal: No obvious joint swelling/deformity     Data Reviewed: I have personally reviewed following labs and imaging studies  CBC: Recent Labs  Lab 12/08/21 2107 12/10/21 0129  WBC 3.6* 3.0*  NEUTROABS 1.8  --   HGB 11.9* 11.2*  HCT 34.2* 32.1*  MCV 80.7 79.7*  PLT 184 814    Basic Metabolic Panel: Recent Labs  Lab 12/08/21 2107 12/10/21 0129 12/11/21 0128  NA 141 136 137  K 4.9 4.8 5.5*  CL 108 108 105  CO2 '25 23 27  '$ GLUCOSE 197* 283* 67*  BUN 38* 40* 44*  CREATININE 1.84* 1.76* 2.25*  CALCIUM 9.9 9.5 10.2  MG  --   --  2.5*    GFR: Estimated Creatinine Clearance: 21.7 mL/min (A) (by C-G formula based on SCr of 2.25 mg/dL (H)). Liver Function Tests: Recent Labs  Lab 12/08/21 2107  AST 31  ALT 17  ALKPHOS 54  BILITOT 0.7  PROT 6.2*  ALBUMIN 3.4*    No results for input(s): "LIPASE", "AMYLASE" in the last 168 hours. No results for input(s): "AMMONIA" in the last 168 hours. Coagulation Profile: No results for  input(s): "INR", "PROTIME" in the last 168 hours. Cardiac Enzymes: No results for input(s): "CKTOTAL", "CKMB", "CKMBINDEX", "TROPONINI" in the last 168 hours. BNP (last 3 results) No results for input(s): "PROBNP" in the last 8760 hours. HbA1C: No results for input(s): "HGBA1C" in the last 72 hours. CBG: Recent Labs  Lab 12/09/21 2116 12/10/21 0724 12/10/21 1128 12/10/21 1623 12/10/21 2128  GLUCAP 268* 271* 368* 224* 133*    Lipid Profile: No results for input(s): "CHOL", "HDL", "LDLCALC", "TRIG", "CHOLHDL", "LDLDIRECT" in the last 72 hours. Thyroid Function Tests: Recent Labs    12/09/21 1444  TSH 0.436    Anemia Panel: No results for input(s): "VITAMINB12", "FOLATE", "FERRITIN", "TIBC", "IRON", "RETICCTPCT" in the last 72 hours. Sepsis Labs: No results for input(s): "PROCALCITON", "LATICACIDVEN" in the last 168 hours.  No results found for this or any previous visit (from the past 240 hour(s)).       Radiology Studies: DG Chest 2 View  Result Date: 12/09/2021 CLINICAL DATA:  Dizziness, nausea EXAM: CHEST - 2 VIEW COMPARISON:  Chest radiograph 09/14/2021 FINDINGS: The cardiomediastinal silhouette is stable. There is calcified atherosclerotic plaque of the aortic arch. There is unchanged asymmetric elevation of the right hemidiaphragm. There is no focal consolidation or pulmonary edema. There is no pleural effusion or pneumothorax. There is no acute osseous abnormality. IMPRESSION: Stable chest with no radiographic evidence of acute cardiopulmonary process. Electronically Signed   By: Valetta Mole M.D.   On: 12/09/2021 08:02        Scheduled Meds:  (feeding supplement) PROSource Plus  30 mL Oral BID BM   amiodarone  400 mg Oral BID   apixaban  5 mg Oral BID   vitamin C  500 mg Oral BID   atorvastatin  80 mg Oral QPM   clopidogrel  75 mg Oral Daily   colesevelam  1,875 mg Oral BID WC   insulin aspart  0-20 Units Subcutaneous TID WC   insulin aspart  0-5 Units  Subcutaneous QHS   insulin glargine-yfgn  15 Units Subcutaneous QHS   multivitamin with minerals  1 tablet Oral Daily   sodium chloride flush  3 mL Intravenous Q12H   zinc sulfate  220 mg Oral Daily   Continuous Infusions:        Aline August, MD [facility] 12/11/2021, 7:33 AM

## 2021-12-12 DIAGNOSIS — N179 Acute kidney failure, unspecified: Secondary | ICD-10-CM | POA: Diagnosis not present

## 2021-12-12 DIAGNOSIS — I5032 Chronic diastolic (congestive) heart failure: Secondary | ICD-10-CM | POA: Diagnosis not present

## 2021-12-12 DIAGNOSIS — I2510 Atherosclerotic heart disease of native coronary artery without angina pectoris: Secondary | ICD-10-CM | POA: Diagnosis not present

## 2021-12-12 DIAGNOSIS — I4891 Unspecified atrial fibrillation: Secondary | ICD-10-CM | POA: Diagnosis not present

## 2021-12-12 LAB — BASIC METABOLIC PANEL
Anion gap: 6 (ref 5–15)
BUN: 49 mg/dL — ABNORMAL HIGH (ref 8–23)
CO2: 24 mmol/L (ref 22–32)
Calcium: 9.7 mg/dL (ref 8.9–10.3)
Chloride: 104 mmol/L (ref 98–111)
Creatinine, Ser: 1.95 mg/dL — ABNORMAL HIGH (ref 0.44–1.00)
GFR, Estimated: 26 mL/min — ABNORMAL LOW (ref >60)
Glucose, Bld: 141 mg/dL — ABNORMAL HIGH (ref 70–99)
Potassium: 4.6 mmol/L (ref 3.5–5.1)
Sodium: 134 mmol/L — ABNORMAL LOW (ref 135–145)

## 2021-12-12 LAB — GLUCOSE, CAPILLARY
Glucose-Capillary: 130 mg/dL — ABNORMAL HIGH (ref 70–99)
Glucose-Capillary: 163 mg/dL — ABNORMAL HIGH (ref 70–99)
Glucose-Capillary: 347 mg/dL — ABNORMAL HIGH (ref 70–99)
Glucose-Capillary: 183 mg/dL — ABNORMAL HIGH (ref 70–99)

## 2021-12-12 LAB — MAGNESIUM: Magnesium: 2.2 mg/dL (ref 1.7–2.4)

## 2021-12-12 MED ORDER — BUTALBITAL-APAP-CAFFEINE 50-325-40 MG PO TABS: 1.0000 | ORAL_TABLET | ORAL | Status: DC | PRN

## 2021-12-12 NOTE — Progress Notes (Signed)
PROGRESS NOTE    Cassandra Adams  [id number]:[id number] DOB: January 27, 1946 DOA: 12/08/2021 PCP: Sharilyn Sites, MD   Brief Narrative:  76 y.o. female with medical history significant of chronic diastolic CHF; difficult to control afib on Eliquis; CAD; stage 3b CKD; HTN; and DM presented with dizziness.  She was found to have A-fib with RVR.  Cardiology was consulted.  Assessment & Plan:   New atrial flutter with RVR/history of PAF -Cardiology following.  Currently intermittently bradycardic and tachycardic  and is on oral amiodarone as per cardiology with plans for possible cardioversion tomorrow -Continue Eliquis  CAD Hyperlipidemia Hypertension -Status post DES in February.  Continue Plavix, WelChol and statin -Blood pressure currently stable.    Chronic diastolic CHF -Compensated.  EF had normalized on last echo in 08/2021.  Entresto on hold.  Lasix, spironolactone also held by cardiology on 12/10/2021.  Cardiology following.  Strict input and output.  Daily weights.  Fluid restriction.  Diabetes mellitus type 2 with hyperglycemia -Last A1c was 8.1.  Continue CBGs with SSI.  Continue Lantus  AKI on chronic kidney disease stage IIIb -Creatinine improving to 1.95 today.  Monitor  Hyponatremia -Mild.  Monitor  Hyperkalemia -Mild.  Resolved.  Status post 1 dose of Lokelma on 12/11/2021.  Repeat a.m. labs  Anemia of chronic disease -From renal failure.  Hemoglobin stable.  Monitor intermittently  Leukopenia -Questionable cause.  Monitor intermittently.  DVT prophylaxis: Eliquis Code Status: Full Family Communication: None at bedside Disposition Plan: Status is: inpatient because: Of need for cardioversion today by cardiology  Consultants: Cardiology  Procedures: None  Antimicrobials: None   Subjective: Patient seen and examined at bedside.  Complains of intermittent headache.  Denies worsening shortness breath, chest pain, fever, palpitations.    Objective: Vitals:    12/11/21 1346 12/11/21 1702 12/11/21 2001 12/12/21 0349  BP:  (!) 153/64 (!) 134/95 (!) 122/53  Pulse:  79 91 74  Resp:  '18 20 19  '$ Temp:  98 F (36.7 C) 97.9 F (36.6 C) 97.9 F (36.6 C)  TempSrc:  Oral Oral Oral  SpO2:  99% 99% 97%  Weight:      Height: '5\' 6"'$  (1.676 m)       Intake/Output Summary (Last 24 hours) at 12/12/2021 0727 Last data filed at 12/11/2021 2228 Gross per 24 hour  Intake 723 ml  Output 400 ml  Net 323 ml    Filed Weights   12/09/21 1600  Weight: 75.4 kg    Examination:  General: No acute distress.  Currently on room air.   ENT/neck: No palpable thyromegaly or neck masses noted.   Respiratory: Bilateral decreased breath sounds at bases with scattered crackles CVS: Currently rate controlled; S1 and S2 are heard  abdominal: Soft, nontender, distended mildly; no organomegaly, normal bowel sounds heard  extremities: No clubbing; mild lower extremity edema present CNS: Alert and oriented.  No focal neurologic deficit.  Moving extremities Lymph: No obvious palpable cervical lymphadenopathy noted Skin: No petechiae/rashes psych: Judgment, mood and affect are normal  musculoskeletal: No obvious joint swelling/deformity     Data Reviewed: I have personally reviewed following labs and imaging studies  CBC: Recent Labs  Lab 12/08/21 2107 12/10/21 0129  WBC 3.6* 3.0*  NEUTROABS 1.8  --   HGB 11.9* 11.2*  HCT 34.2* 32.1*  MCV 80.7 79.7*  PLT 184 532    Basic Metabolic Panel: Recent Labs  Lab 12/08/21 2107 12/10/21 0129 12/11/21 0128 12/12/21 0339  NA 141 136 137 134*  K 4.9 4.8 5.5* 4.6  CL 108 108 105 104  CO2 '25 23 27 24  '$ GLUCOSE 197* 283* 67* 141*  BUN 38* 40* 44* 49*  CREATININE 1.84* 1.76* 2.25* 1.95*  CALCIUM 9.9 9.5 10.2 9.7  MG  --   --  2.5* 2.2    GFR: Estimated Creatinine Clearance: 25.1 mL/min (A) (by C-G formula based on SCr of 1.95 mg/dL (H)). Liver Function Tests: Recent Labs  Lab 12/08/21 2107  AST 31  ALT 17   ALKPHOS 54  BILITOT 0.7  PROT 6.2*  ALBUMIN 3.4*    No results for input(s): "LIPASE", "AMYLASE" in the last 168 hours. No results for input(s): "AMMONIA" in the last 168 hours. Coagulation Profile: No results for input(s): "INR", "PROTIME" in the last 168 hours. Cardiac Enzymes: No results for input(s): "CKTOTAL", "CKMB", "CKMBINDEX", "TROPONINI" in the last 168 hours. BNP (last 3 results) No results for input(s): "PROBNP" in the last 8760 hours. HbA1C: No results for input(s): "HGBA1C" in the last 72 hours. CBG: Recent Labs  Lab 12/10/21 2128 12/11/21 0733 12/11/21 1153 12/11/21 1700 12/11/21 2152  GLUCAP 133* 77 325* 333* 121*    Lipid Profile: No results for input(s): "CHOL", "HDL", "LDLCALC", "TRIG", "CHOLHDL", "LDLDIRECT" in the last 72 hours. Thyroid Function Tests: Recent Labs    12/09/21 1444  TSH 0.436    Anemia Panel: No results for input(s): "VITAMINB12", "FOLATE", "FERRITIN", "TIBC", "IRON", "RETICCTPCT" in the last 72 hours. Sepsis Labs: No results for input(s): "PROCALCITON", "LATICACIDVEN" in the last 168 hours.  No results found for this or any previous visit (from the past 240 hour(s)).       Radiology Studies: No results found.      Scheduled Meds:  (feeding supplement) PROSource Plus  30 mL Oral BID BM   amiodarone  400 mg Oral BID   apixaban  5 mg Oral BID   vitamin C  500 mg Oral BID   atorvastatin  80 mg Oral QPM   clopidogrel  75 mg Oral Daily   colesevelam  1,875 mg Oral BID WC   insulin aspart  0-20 Units Subcutaneous TID WC   insulin aspart  0-5 Units Subcutaneous QHS   insulin glargine-yfgn  15 Units Subcutaneous QHS   multivitamin with minerals  1 tablet Oral Daily   sodium chloride flush  3 mL Intravenous Q12H   zinc sulfate  220 mg Oral Daily   Continuous Infusions:        Aline August, MD [facility] 12/12/2021, 7:27 AM

## 2021-12-12 NOTE — Progress Notes (Signed)
TRH night cross cover note:   I was notified by RN that the patient is experiencing headache and requesting pain medication for such.  Reportedly similar to the headache that she was experiencing last night, which she notes did not significantly improve with acetaminophen, tramadol, as well as a one-time dose of Toradol administered at that time.   Tonight, patient's headache has once again not improved significantly following doses of prn acetaminophen and tramadol.  As Toradol was ineffective last night, and given that she does have a history of CHF as well as CKD 3B, will refrain from additional doses of Toradol at this time.  I subsequently placed order for Dilaudid 0.5 mg IV acute 2 hours prn x2 doses.   Babs Bertin, DO Hospitalist

## 2021-12-12 NOTE — Consult Note (Signed)
WOC Nurse Consult Note: Reason for Consult: irritant contact dermatitis to right foot, between 2nd and 3rd digits.  ICD-10 CM Codes for Irritant Dermatitis  L24A9 - Due to friction or contact with other specified body fluids (perspiration, retained fluids from external sources)  L30.4  - Erythema intertrigo. Also used for abrasion of the hand, chafing of the skin, dermatitis due to sweating and friction, friction dermatitis, friction eczema, and genital/thigh intertrigo.   Wound type:Moisture, friction Pressure Injury POA: N/A Measurement: N/A Wound MCR:FVOH Drainage (amount, consistency, odor) small serous Periwound:macerated Dressing procedure/placement/frequency: I have provided Nursing with guidance for the care of this lesion using a saline cleanse and thorough drying followed by placement of a silver hydrofiber for moisture wicking and donation of antimicrobial properties. This will be performed daily and PRN dressing dislodgement.  If not improvement after 7-10 days, patient may benefit from follow up with a podiatric medicine physician/podiatrist or her PCP.  [place] nursing team will not follow, but will remain available to this patient, the nursing and medical teams.  Please re-consult if needed.  Thank you for inviting [place] to participate in this patient's Plan of Care.  Maudie Flakes, MSN, RN, CNS, [place], Serita Grammes, [employer], [employer] phone:  [phone]

## 2021-12-12 NOTE — H&P (View-Only) (Signed)
Progress Note  Patient Name: Cassandra Adams Date of Encounter: 12/12/2021  [facility] Cardiologist: [name], MD   Subjective   Denies any chest pain or shortness of breath.  Complains of headache this morning.  Remains in atrial fibrillation with controlled ventricular response with heart rates in the 80s.  Inpatient Medications    Scheduled Meds:  (feeding supplement) PROSource Plus  30 mL Oral BID BM   amiodarone  400 mg Oral BID   apixaban  5 mg Oral BID   vitamin C  500 mg Oral BID   atorvastatin  80 mg Oral QPM   clopidogrel  75 mg Oral Daily   colesevelam  1,875 mg Oral BID WC   insulin aspart  0-20 Units Subcutaneous TID WC   insulin aspart  0-5 Units Subcutaneous QHS   insulin glargine-yfgn  15 Units Subcutaneous QHS   multivitamin with minerals  1 tablet Oral Daily   sodium chloride flush  3 mL Intravenous Q12H   zinc sulfate  220 mg Oral Daily   Continuous Infusions:  PRN Meds: acetaminophen **OR** acetaminophen, hydrALAZINE, HYDROmorphone (DILAUDID) injection, naLOXone (NARCAN)  injection, ondansetron **OR** ondansetron (ZOFRAN) IV, traMADol   Vital Signs    Vitals:   12/11/21 1346 12/11/21 1702 12/11/21 2001 12/12/21 0349  BP:  (!) 153/64 (!) 134/95 (!) 122/53  Pulse:  79 91 74  Resp:  '18 20 19  '$ Temp:  98 F (36.7 C) 97.9 F (36.6 C) 97.9 F (36.6 C)  TempSrc:  Oral Oral Oral  SpO2:  99% 99% 97%  Weight:      Height: '5\' 6"'$  (1.676 m)       Intake/Output Summary (Last 24 hours) at 12/12/2021 0823 Last data filed at 12/11/2021 2228 Gross per 24 hour  Intake 483 ml  Output 200 ml  Net 283 ml       12/09/2021    4:00 PM 10/21/2021    1:52 PM 10/13/2021   10:48 AM  Last 3 Weights  Weight (lbs) 166 lb 3.6 oz 163 lb 6.4 oz 158 lb  Weight (kg) 75.4 kg 74.118 kg 71.668 kg      Telemetry    Atrial fibrillation with controlled ventricular response- Personally Reviewed  ECG    No new EKG to review- Personally  Reviewed  Physical Exam  GEN: Well nourished, well developed in no acute distress HEENT: Normal NECK: No JVD; No carotid bruits LYMPHATICS: No lymphadenopathy CARDIAC: Irregularly irregular, no murmurs, rubs, gallops RESPIRATORY:  Clear to auscultation without rales, wheezing or rhonchi  ABDOMEN: Soft, non-tender, non-distended MUSCULOSKELETAL:  No edema; No deformity  SKIN: Warm and dry NEUROLOGIC:  Alert and oriented x 3 PSYCHIATRIC:  Normal affect   Labs    High Sensitivity Troponin:   Recent Labs  Lab 12/08/21 2107 12/08/21 2348  TROPONINIHS 29* 27*       Chemistry Recent Labs  Lab 12/08/21 2107 12/10/21 0129 12/11/21 0128 12/12/21 0339  NA 141 136 137 134*  K 4.9 4.8 5.5* 4.6  CL 108 108 105 104  CO2 '25 23 27 24  '$ GLUCOSE 197* 283* 67* 141*  BUN 38* 40* 44* 49*  CREATININE 1.84* 1.76* 2.25* 1.95*  CALCIUM 9.9 9.5 10.2 9.7  PROT 6.2*  --   --   --   ALBUMIN 3.4*  --   --   --   AST 31  --   --   --   ALT 17  --   --   --  ALKPHOS 54  --   --   --   BILITOT 0.7  --   --   --   GFRNONAA 28* 29* 22* 26*  ANIONGAP '8 5 5 6      '$ Hematology Recent Labs  Lab 12/08/21 2107 12/10/21 0129  WBC 3.6* 3.0*  RBC 4.24 4.03  HGB 11.9* 11.2*  HCT 34.2* 32.1*  MCV 80.7 79.7*  MCH 28.1 27.8  MCHC 34.8 34.9  RDW 15.4 15.2  PLT 184 153     BNPNo results for input(s): "BNP", "PROBNP" in the last 168 hours.   DDimer No results for input(s): "DDIMER" in the last 168 hours.   CHA2DS2-VASc Score = 9  This indicates a 12.2% annual risk of stroke. The patient's score is based upon: CHF History: 1 HTN History: 1 Diabetes History: 1 Stroke History: 2 Vascular Disease History: 1 Age Score: 2 Gender Score: 1   Radiology    No results found.  Cardiac Studies   2D Echo 08/2021 IMPRESSIONS    1. Limited echo with limited images, no color doppler   2. Left ventricular ejection fraction, by estimation, is 55 to 60%. The  left ventricle has normal function.  The left ventricle has no regional  wall motion abnormalities. There is moderate eccentric left ventricular  hypertrophy of the basal-septal  segment. There is moderate hypokinesis of the left ventricular, basal  inferior wall.   Comparison(s): Prior images reviewed side by side. Changes from prior  study are noted. 08/04/2021: LVEF 60-65%, severe assymetric basal septal  hypertrophy.   Conclusion(s)/Recommendation(s): There is basal inferior wall hypokinesis  in this study, however, this was noted in the prior study on my review as  well. Overall, no new wall motion abnormalities.   Patient Profile     76 y.o. female with a hx of hypertension, hyperlipidemia, DM2, CAD, PAF, CKD stage III and history of TIA who is being seen 12/09/2021 for the evaluation of atrial flutter with RVR at the request of Dr. Lorin Mercy.  Assessment & Plan    New atrial flutter with RVR:  -Patient has a history of PAF, however initial EKG and telemetry shows she is currently in atrial flutter with RVR which is new for her.   -She is now in atrial fibrillation with controlled ventricular response  -she is not on AV nodal blocking agent at home due to previous history of bradycardia (HR in the low 50s after she spontaneously converted in March).   -She has been compliant with Eliquis.  -Continue Eliquis 5 mg twice daily and amiodarone 400 mg twice daily load which started on Friday 6/16   PAF:  -She underwent cardioversion in July 2022, however had recurrence in March 2023, she was able to spontaneously convert back to sinus rhythm during the previous admission.   -She is now back in atrial fibrillation from atrial flutter that she was admitted with  -she has been compliant with her Eliquis at home and has not missed any doses in the past 4 weeks.. -She has been on amiodarone 400 mg twice daily load since Friday.  Heart rate much improved in the 80s.   -Continue Eliquis 5 mg twice daily and will decrease amiodarone to  400 mg daily for 2 weeks then 200 mg daily thereafter  -Plan is for DCCV  tomorrow (scheduled for today) -Make n.p.o. after midnight -Shared Decision Making/Informed Consent The risks (stroke, cardiac arrhythmias rarely resulting in the need for a temporary or permanent pacemaker, skin irritation  or burns and complications associated with conscious sedation including aspiration, arrhythmia, respiratory failure and death), benefits (restoration of normal sinus rhythm) and alternatives of a direct current cardioversion were explained in detail to Ms. Oki and she agrees to proceed.    CAD:  -Patient underwent DES to proximal LAD in February 2023 with known chronically occluded RCA with collaterals -She denies any anginal symptoms -Continue Plavix 75 mg daily and atorvastatin 80 mg daily-No aspirin due to DOAC  Chronic diastolic CHF -She appears euvolemic on exam today -Admission chest x-ray with no acute disease  -Entresto, Lasix, Jardiance and spironolactone are on hold due to AKI and serum creatinine improved from 2.25->>1.95 today  Hypertension -BP stable at 122/53 mmHg -Entresto and spironolactone on hold due to AKI   Hyperlipidemia -LDL goal less than 70 -LDL was 63 and HDL 69 on 01/16/2021 -Continue atorvastatin 80 mg daily   CKD stage IIIb -Serum creatinine improved from 2.25-1.95 -Continue to hold Entresto and diuretics -follow renal function closely  For questions or updates, please contact [facility] Please consult [email or web address] for contact info under        Signed, Fransico Him, MD  12/12/2021, 8:23 AM

## 2021-12-12 NOTE — Progress Notes (Addendum)
Progress Note  Patient Name: Cassandra Adams Date of Encounter: 12/12/2021  [facility] Cardiologist: [name], MD   Subjective   Denies any chest pain or shortness of breath.  Complains of headache this morning.  Remains in atrial fibrillation with controlled ventricular response with heart rates in the 80s.  Inpatient Medications    Scheduled Meds:  (feeding supplement) PROSource Plus  30 mL Oral BID BM   amiodarone  400 mg Oral BID   apixaban  5 mg Oral BID   vitamin C  500 mg Oral BID   atorvastatin  80 mg Oral QPM   clopidogrel  75 mg Oral Daily   colesevelam  1,875 mg Oral BID WC   insulin aspart  0-20 Units Subcutaneous TID WC   insulin aspart  0-5 Units Subcutaneous QHS   insulin glargine-yfgn  15 Units Subcutaneous QHS   multivitamin with minerals  1 tablet Oral Daily   sodium chloride flush  3 mL Intravenous Q12H   zinc sulfate  220 mg Oral Daily   Continuous Infusions:  PRN Meds: acetaminophen **OR** acetaminophen, hydrALAZINE, HYDROmorphone (DILAUDID) injection, naLOXone (NARCAN)  injection, ondansetron **OR** ondansetron (ZOFRAN) IV, traMADol   Vital Signs    Vitals:   12/11/21 1346 12/11/21 1702 12/11/21 2001 12/12/21 0349  BP:  (!) 153/64 (!) 134/95 (!) 122/53  Pulse:  79 91 74  Resp:  '18 20 19  '$ Temp:  98 F (36.7 C) 97.9 F (36.6 C) 97.9 F (36.6 C)  TempSrc:  Oral Oral Oral  SpO2:  99% 99% 97%  Weight:      Height: '5\' 6"'$  (1.676 m)       Intake/Output Summary (Last 24 hours) at 12/12/2021 0823 Last data filed at 12/11/2021 2228 Gross per 24 hour  Intake 483 ml  Output 200 ml  Net 283 ml       12/09/2021    4:00 PM 10/21/2021    1:52 PM 10/13/2021   10:48 AM  Last 3 Weights  Weight (lbs) 166 lb 3.6 oz 163 lb 6.4 oz 158 lb  Weight (kg) 75.4 kg 74.118 kg 71.668 kg      Telemetry    Atrial fibrillation with controlled ventricular response- Personally Reviewed  ECG    No new EKG to review- Personally  Reviewed  Physical Exam  GEN: Well nourished, well developed in no acute distress HEENT: Normal NECK: No JVD; No carotid bruits LYMPHATICS: No lymphadenopathy CARDIAC: Irregularly irregular, no murmurs, rubs, gallops RESPIRATORY:  Clear to auscultation without rales, wheezing or rhonchi  ABDOMEN: Soft, non-tender, non-distended MUSCULOSKELETAL:  No edema; No deformity  SKIN: Warm and dry NEUROLOGIC:  Alert and oriented x 3 PSYCHIATRIC:  Normal affect   Labs    High Sensitivity Troponin:   Recent Labs  Lab 12/08/21 2107 12/08/21 2348  TROPONINIHS 29* 27*       Chemistry Recent Labs  Lab 12/08/21 2107 12/10/21 0129 12/11/21 0128 12/12/21 0339  NA 141 136 137 134*  K 4.9 4.8 5.5* 4.6  CL 108 108 105 104  CO2 '25 23 27 24  '$ GLUCOSE 197* 283* 67* 141*  BUN 38* 40* 44* 49*  CREATININE 1.84* 1.76* 2.25* 1.95*  CALCIUM 9.9 9.5 10.2 9.7  PROT 6.2*  --   --   --   ALBUMIN 3.4*  --   --   --   AST 31  --   --   --   ALT 17  --   --   --  ALKPHOS 54  --   --   --   BILITOT 0.7  --   --   --   GFRNONAA 28* 29* 22* 26*  ANIONGAP '8 5 5 6      '$ Hematology Recent Labs  Lab 12/08/21 2107 12/10/21 0129  WBC 3.6* 3.0*  RBC 4.24 4.03  HGB 11.9* 11.2*  HCT 34.2* 32.1*  MCV 80.7 79.7*  MCH 28.1 27.8  MCHC 34.8 34.9  RDW 15.4 15.2  PLT 184 153     BNPNo results for input(s): "BNP", "PROBNP" in the last 168 hours.   DDimer No results for input(s): "DDIMER" in the last 168 hours.   CHA2DS2-VASc Score = 9  This indicates a 12.2% annual risk of stroke. The patient's score is based upon: CHF History: 1 HTN History: 1 Diabetes History: 1 Stroke History: 2 Vascular Disease History: 1 Age Score: 2 Gender Score: 1   Radiology    No results found.  Cardiac Studies   2D Echo 08/2021 IMPRESSIONS    1. Limited echo with limited images, no color doppler   2. Left ventricular ejection fraction, by estimation, is 55 to 60%. The  left ventricle has normal function.  The left ventricle has no regional  wall motion abnormalities. There is moderate eccentric left ventricular  hypertrophy of the basal-septal  segment. There is moderate hypokinesis of the left ventricular, basal  inferior wall.   Comparison(s): Prior images reviewed side by side. Changes from prior  study are noted. 08/04/2021: LVEF 60-65%, severe assymetric basal septal  hypertrophy.   Conclusion(s)/Recommendation(s): There is basal inferior wall hypokinesis  in this study, however, this was noted in the prior study on my review as  well. Overall, no new wall motion abnormalities.   Patient Profile     76 y.o. female with a hx of hypertension, hyperlipidemia, DM2, CAD, PAF, CKD stage III and history of TIA who is being seen 12/09/2021 for the evaluation of atrial flutter with RVR at the request of Dr. Lorin Mercy.  Assessment & Plan    New atrial flutter with RVR:  -Patient has a history of PAF, however initial EKG and telemetry shows she is currently in atrial flutter with RVR which is new for her.   -She is now in atrial fibrillation with controlled ventricular response  -she is not on AV nodal blocking agent at home due to previous history of bradycardia (HR in the low 50s after she spontaneously converted in March).   -She has been compliant with Eliquis.  -Continue Eliquis 5 mg twice daily and amiodarone 400 mg twice daily load which started on Friday 6/16   PAF:  -She underwent cardioversion in July 2022, however had recurrence in March 2023, she was able to spontaneously convert back to sinus rhythm during the previous admission.   -She is now back in atrial fibrillation from atrial flutter that she was admitted with  -she has been compliant with her Eliquis at home and has not missed any doses in the past 4 weeks.. -She has been on amiodarone 400 mg twice daily load since Friday.  Heart rate much improved in the 80s.   -Continue Eliquis 5 mg twice daily and will decrease amiodarone to  400 mg daily for 2 weeks then 200 mg daily thereafter  -Plan is for DCCV  tomorrow (scheduled for today) -Make n.p.o. after midnight -Shared Decision Making/Informed Consent The risks (stroke, cardiac arrhythmias rarely resulting in the need for a temporary or permanent pacemaker, skin irritation  or burns and complications associated with conscious sedation including aspiration, arrhythmia, respiratory failure and death), benefits (restoration of normal sinus rhythm) and alternatives of a direct current cardioversion were explained in detail to Ms. Aydin and she agrees to proceed.    CAD:  -Patient underwent DES to proximal LAD in February 2023 with known chronically occluded RCA with collaterals -She denies any anginal symptoms -Continue Plavix 75 mg daily and atorvastatin 80 mg daily-No aspirin due to DOAC  Chronic diastolic CHF -She appears euvolemic on exam today -Admission chest x-ray with no acute disease  -Entresto, Lasix, Jardiance and spironolactone are on hold due to AKI and serum creatinine improved from 2.25->>1.95 today  Hypertension -BP stable at 122/53 mmHg -Entresto and spironolactone on hold due to AKI   Hyperlipidemia -LDL goal less than 70 -LDL was 63 and HDL 69 on 01/16/2021 -Continue atorvastatin 80 mg daily   CKD stage IIIb -Serum creatinine improved from 2.25-1.95 -Continue to hold Entresto and diuretics -follow renal function closely  For questions or updates, please contact [facility] Please consult [email or web address] for contact info under        Signed, Fransico Him, MD  12/12/2021, 8:23 AM

## 2021-12-12 NOTE — Progress Notes (Signed)
Pt complaining of knee pain and requesting a ace bandage to support knee. Verbally advised by attending okay to place ace wrap on left knee.

## 2021-12-12 NOTE — TOC Initial Note (Signed)
Transition of Care [facility]) - Initial/Assessment Note    Patient Details  Name: Cassandra Adams MRN: [id number] Date of Birth: [birth year]  [facility]) CM/SW Contact:    Bethena Roys, RN Phone Number: 12/12/2021, 4:12 PM  Clinical Narrative:  Risk for readmission assessment completed. PTA patient was from home alone and has family support. Family takes the patient to MD appointments and she gets medications without any issues. Case Manager discussed home health services and she declined services. Plan for patient is a cardioversion on 12-13-21. Case Manager will continue to follow for additional transition of care needs as the patient progresses.               Expected Discharge Plan: Home/Self Care Barriers to Discharge: Continued Medical Work up   Patient Goals and CMS Choice Patient states their goals for this hospitalization and ongoing recovery are:: patient wants to return home   Choice offered to / list presented to : NA  Expected Discharge Plan and Services Expected Discharge Plan: Home/Self Care In-house Referral: NA Discharge Planning Services: CM Consult Post Acute Care Choice: NA Living arrangements for the past 2 months: Apartment                   DME Agency: NA       HH Arranged: Refused HH          Prior Living Arrangements/Services Living arrangements for the past 2 months: Apartment Lives with:: Self (has family support) Patient language and need for interpreter reviewed:: Yes Do you feel safe going back to the place where you live?: Yes      Need for Family Participation in Patient Care: Yes (Comment)   Current home services: DME (patient has cane, rolling walker and bedside commode.) Criminal Activity/Legal Involvement Pertinent to Current Situation/Hospitalization: No - Comment as needed  Activities of Daily Living Home Assistive Devices/Equipment: Walker (specify type) ADL Screening (condition at time of  admission) Patient's cognitive ability adequate to safely complete daily activities?: Yes Is the patient deaf or have difficulty hearing?: No Does the patient have difficulty seeing, even when wearing glasses/contacts?: No Does the patient have difficulty concentrating, remembering, or making decisions?: No Patient able to express need for assistance with ADLs?: Yes Does the patient have difficulty dressing or bathing?: No Independently performs ADLs?: Yes (appropriate for developmental age) Does the patient have difficulty walking or climbing stairs?: No Weakness of Legs: Both Weakness of Arms/Hands: None  Permission Sought/Granted Permission sought to share information with : Family Supports, Case Manager                Emotional Assessment Appearance:: Appears stated age Attitude/Demeanor/Rapport: Engaged Affect (typically observed): Appropriate Orientation: : Oriented to Self, Oriented to Place, Oriented to  Time Alcohol / Substance Use: Not Applicable Psych Involvement: No (comment)  Admission diagnosis:  Persistent atrial fibrillation ([place]) [I48.19] Atrial fibrillation with rapid ventricular response ([place]) [I48.91] Atrial fibrillation with RVR ([place]) [I48.91] A-fib ([place]) [I48.91] Patient Active Problem List   Diagnosis Date Noted   A-fib ([place]) 12/10/2021   Demand ischemia ([place])    Mixed hyperlipidemia 09/15/2021   Diabetic foot ulcer ([place]) 09/15/2021   Chronic diastolic CHF (congestive heart failure) ([place]) 09/14/2021   NSTEMI (non-ST elevated myocardial infarction) ([place]) 08/02/2021   Sepsis ([place]) 06/11/2021   Secondary hypercoagulable state ([place]) 01/25/2021   Acute pulmonary edema ([place])    Acute CHF (congestive heart failure) ([place]) 01/16/2021   Acute respiratory distress 01/16/2021  Microcytic anemia 01/16/2021   Hypertensive urgency 01/16/2021   Acute on chronic diastolic (congestive) heart failure ([place]) 01/16/2021   Persistent atrial fibrillation (HCC)     Gastric nodule    Acute blood loss anemia 11/07/2020   Hematemesis with nausea 11/07/2020   Atrial fibrillation, chronic ([place]) 11/07/2020   Transient hypotension 11/07/2020   Atypical chest pain 11/07/2020   Elevated troponin 11/07/2020   Syncope and collapse 11/05/2020   CAD (coronary artery disease)    Epistaxis due to trauma    Benign paroxysmal positional vertigo 05/11/2019   Chronic headaches 05/11/2019   Atrial fibrillation with RVR ([place])    Right hemiparesis ([place]) 05/05/2019   Uncontrolled type 2 diabetes mellitus with hyperglycemia ([place]) 05/05/2019   Stage 3b chronic kidney disease (CKD) ([place]) 05/05/2019   Left-sided weakness 05/04/2019   Aphasia 02/26/2019   DM type 2 causing vascular disease ([place]) 06/11/2017   Type 2 diabetes mellitus with stage 3a chronic kidney disease, with long-term current use of insulin ([place]) 06/11/2017   Carotid stenosis 02/05/2017   TIA (transient ischemic attack) 01/20/2017   Carotid stenosis, right 01/20/2017   Diabetic hyperosmolar non-ketotic state ([place]) 01/19/2017   AKI (acute kidney injury) ([place]) 01/19/2017   Left leg weakness 01/19/2017   Dyslipidemia 11/24/2016   Diaphoresis 02/18/2016   Hypoglycemia 02/18/2016   Bradycardia 04/22/2015   Coronary artery disease due to lipid rich plaque 02/26/2014   PAF (paroxysmal atrial fibrillation) ([place]) 02/26/2014   Occlusion and stenosis of carotid artery without mention of cerebral infarction 11/21/2013   Pain in limb-Left neck 11/21/2013   Carotid stenosis, bilateral 11/17/2011   Subclinical hyperthyroidism 02/15/2010   GERD 02/15/2010   DYSPHAGIA UNSPECIFIED 02/15/2010   Essential hypertension 02/11/2010   PCP:  Sharilyn Sites, MD Pharmacy:   [id number] [place] [place]Phone: [phone]: [phone] [id number] [place]one: [phone]: [phone] Risk Interventions    12/12/2021    4:10 PM 01/18/2021    2:41 PM 01/18/2021    2:35 PM  Readmission Risk Prevention Plan  Transportation Screening Complete Complete Complete  PCP or Specialist Appt within 3-5 Days Complete Complete   HRI or Home Care Consult Complete Complete Complete  Social Work Consult for Recovery Care Planning/Counseling Complete Complete   Palliative Care Screening Not Applicable Not Applicable   Medication Review (RN Care Manager) Referral to Pharmacy Complete

## 2021-12-12 NOTE — Progress Notes (Signed)
Mobility Specialist Progress Note    12/12/21 1236  Mobility  Activity Ambulated with assistance to bathroom  Level of Assistance Contact guard assist, steadying assist  Assistive Device Front wheel walker  Distance Ambulated (ft) 20 ft (15+5)  Activity Response Tolerated well  $Mobility charge 1 Mobility   Pre-Mobility: 85 HR  Pt received in bed declining further ambulation and c/o some nausea. Had BM in BR. Left sitting at sink to wash-up with RN present. Will f/u as schedule permits.   Hildred Alamin Mobility Specialist

## 2021-12-13 ENCOUNTER — Encounter (HOSPITAL_COMMUNITY): Payer: Self-pay | Admitting: Internal Medicine

## 2021-12-13 ENCOUNTER — Inpatient Hospital Stay (HOSPITAL_COMMUNITY): Payer: Medicare HMO | Admitting: General Practice

## 2021-12-13 ENCOUNTER — Encounter (HOSPITAL_COMMUNITY)
Admission: EM | Disposition: A | Discharge status: Home / Self Care | Payer: Self-pay | Source: Home / Self Care | Attending: Internal Medicine

## 2021-12-13 DIAGNOSIS — I252 Old myocardial infarction: Secondary | ICD-10-CM

## 2021-12-13 DIAGNOSIS — I4891 Unspecified atrial fibrillation: Secondary | ICD-10-CM

## 2021-12-13 DIAGNOSIS — I130 Hypertensive heart and chronic kidney disease with heart failure and stage 1 through stage 4 chronic kidney disease, or unspecified chronic kidney disease: Secondary | ICD-10-CM

## 2021-12-13 DIAGNOSIS — I5032 Chronic diastolic (congestive) heart failure: Secondary | ICD-10-CM

## 2021-12-13 DIAGNOSIS — N1832 Chronic kidney disease, stage 3b: Secondary | ICD-10-CM

## 2021-12-13 DIAGNOSIS — I2510 Atherosclerotic heart disease of native coronary artery without angina pectoris: Secondary | ICD-10-CM | POA: Diagnosis not present

## 2021-12-13 DIAGNOSIS — N179 Acute kidney failure, unspecified: Secondary | ICD-10-CM | POA: Diagnosis not present

## 2021-12-13 HISTORY — PX: CARDIOVERSION: SHX1299

## 2021-12-13 LAB — BASIC METABOLIC PANEL WITH GFR
Anion gap: 9 (ref 5–15)
BUN: 57 mg/dL — ABNORMAL HIGH (ref 8–23)
CO2: 20 mmol/L — ABNORMAL LOW (ref 22–32)
Calcium: 9.7 mg/dL (ref 8.9–10.3)
Chloride: 107 mmol/L (ref 98–111)
Creatinine, Ser: 2.01 mg/dL — ABNORMAL HIGH (ref 0.44–1.00)
GFR, Estimated: 25 mL/min — ABNORMAL LOW
Glucose, Bld: 152 mg/dL — ABNORMAL HIGH (ref 70–99)
Potassium: 4.9 mmol/L (ref 3.5–5.1)
Sodium: 136 mmol/L (ref 135–145)

## 2021-12-13 LAB — MAGNESIUM: Magnesium: 2.3 mg/dL (ref 1.7–2.4)

## 2021-12-13 LAB — PROTIME-INR
INR: 1.4 — ABNORMAL HIGH (ref 0.8–1.2)
Prothrombin Time: 16.6 seconds — ABNORMAL HIGH (ref 11.4–15.2)

## 2021-12-13 LAB — GLUCOSE, CAPILLARY
Glucose-Capillary: 133 mg/dL — ABNORMAL HIGH (ref 70–99)
Glucose-Capillary: 149 mg/dL — ABNORMAL HIGH (ref 70–99)
Glucose-Capillary: 277 mg/dL — ABNORMAL HIGH (ref 70–99)
Glucose-Capillary: 349 mg/dL — ABNORMAL HIGH (ref 70–99)

## 2021-12-13 SURGERY — CARDIOVERSION
Anesthesia: General

## 2021-12-13 MED ORDER — PROPOFOL 10 MG/ML IV BOLUS
INTRAVENOUS | Status: DC | PRN
  Administered 2021-12-13: 40 mg via INTRAVENOUS

## 2021-12-13 MED ORDER — AMIODARONE HCL 200 MG PO TABS
400.0000 mg | ORAL_TABLET | Freq: Every day | ORAL | Status: DC
  Administered 2021-12-13 – 2021-12-15 (×3): 400 mg via ORAL
  Filled 2021-12-13 (×3): qty 2

## 2021-12-13 MED ORDER — SODIUM CHLORIDE 0.9 % IV SOLN: INTRAVENOUS | Status: DC

## 2021-12-13 NOTE — CV Procedure (Addendum)
Duplicate note

## 2021-12-13 NOTE — Interval H&P Note (Signed)
History and Physical Interval Note:  12/13/2021 12:04 PM  Cassandra Adams  has presented today for surgery, with the diagnosis of afib.  The various methods of treatment have been discussed with the patient and family. After consideration of risks, benefits and other options for treatment, the patient has consented to  Procedure(s): CARDIOVERSION (N/A) as a surgical intervention.  The patient's history has been reviewed, patient examined, no change in status, stable for surgery.  I have reviewed the patient's chart and labs.  Questions were answered to the patient's satisfaction.     Mertie Moores

## 2021-12-13 NOTE — Transfer of Care (Signed)
Immediate Anesthesia Transfer of Care Note  Patient: Cassandra Adams  Procedure(s) Performed: CARDIOVERSION  Patient Location: Endoscopy Unit  Anesthesia Type:General  Level of Consciousness: awake and drowsy  Airway & Oxygen Therapy: Patient Spontanous Breathing  Post-op Assessment: Report given to RN and Post -op Vital signs reviewed and stable  Post vital signs: Reviewed and stable  Last Vitals:  Vitals Value Taken Time  BP 135/41  (76)   Temp    Pulse 58   Resp 14   SpO2 100     Last Pain:  Vitals:   12/13/21 1110  TempSrc: Temporal  PainSc: 0-No pain      Patients Stated Pain Goal: 0 (55/25/89 4834)  Complications: No notable events documented.

## 2021-12-13 NOTE — CV Procedure (Signed)
    Cardioversion Note  CHIQUITA Adams [id number] July 25, 1945  Procedure: DC Cardioversion Indications: atrial fib   Procedure Details Consent: Obtained Time Out: Verified patient identification, verified procedure, site/side was marked, verified correct patient position, special equipment/implants available, Radiology Safety Procedures followed,  medications/allergies/relevent history reviewed, required imaging and test results available.  Performed  The patient has been on adequate anticoagulation.  The patient received IV Propofol 40 mg IV  for sedation.  Synchronous cardioversion was performed at 200   joules.  The cardioversion was successful     Complications: No apparent complications Patient did tolerate procedure well.   Thayer Headings, Brooke Bonito., MD, [facility] 12/13/2021, 12:21 PM

## 2021-12-13 NOTE — Progress Notes (Signed)
PROGRESS NOTE    Cassandra Adams  VEL:[id number] DOB: January 01, 1946 DOA: 12/08/2021 PCP: Sharilyn Sites, MD   Brief Narrative:  76 y.o. female with medical history significant of chronic diastolic CHF; difficult to control afib on Eliquis; CAD; stage 3b CKD; HTN; and DM presented with dizziness.  She was found to have A-fib with RVR.  Cardiology was consulted.  Assessment & Plan:   New atrial flutter with RVR/history of PAF -Cardiology following.  Currently intermittently bradycardic and tachycardic  and is on oral amiodarone as per cardiology with plans for possible cardioversion today -Continue Eliquis  CAD Hyperlipidemia Hypertension -Status post DES in February.  Continue Plavix, WelChol and statin -Blood pressure currently stable.  Antihypertensives on hold for now.  Chronic diastolic CHF -Compensated.  EF had normalized on last echo in 08/2021.  Entresto on hold.  Lasix, spironolactone also held by cardiology on 12/10/2021.  Cardiology following.  Strict input and output.  Daily weights.  Fluid restriction.  Diabetes mellitus type 2 with hyperglycemia -Last A1c was 8.1.  Continue CBGs with SSI.  Continue Lantus  AKI on chronic kidney disease stage IIIb -Baseline creatinine of 1.4-1.6.  Creatinine 2.01 today.  Monitor  Hyponatremia -Resolved  Hyperkalemia - Resolved.  Status post 1 dose of Lokelma on 12/11/2021.  Repeat a.m. labs  Anemia of chronic disease -From renal failure.  Hemoglobin stable.  Monitor intermittently  Leukopenia -Questionable cause.  Monitor intermittently.  DVT prophylaxis: Eliquis Code Status: Full Family Communication: None at bedside Disposition Plan: Status is: inpatient because: Of need for cardioversion today by cardiology  Consultants: Cardiology  Procedures: None  Antimicrobials: None   Subjective: Patient seen and examined at bedside.  No chest pain, worsening shortness of breath, fever or vomiting  reported.  Objective: Vitals:   12/12/21 0349 12/12/21 1619 12/12/21 2125 12/13/21 0546  BP: (!) 122/53 128/71 120/69 132/64  Pulse: 74 79 71 70  Resp: '19 18 16 20  '$ Temp: 97.9 F (36.6 C) 98.4 F (36.9 C) 98.6 F (37 C) 98.6 F (37 C)  TempSrc: Oral Oral Oral Oral  SpO2: 97% 99% 99% 100%  Weight:      Height:        Intake/Output Summary (Last 24 hours) at 12/13/2021 0809 Last data filed at 12/12/2021 2147 Gross per 24 hour  Intake 3 ml  Output --  Net 3 ml    Filed Weights   12/09/21 1600  Weight: 75.4 kg    Examination:  General: On room air currently.  No distress.  Elderly female lying in bed. ENT/neck: No JVD elevation or palpable neck masses noted Respiratory: Decreased breath sounds at bases bilaterally with some crackles CVS: S1-S2 heard; rate is currently controlled abdominal: Soft, nontender, slightly distended; no organomegaly, bowel sounds are heard Extremities: No cyanosis; trace lower extremity edema present  CNS: Awake; no focal neurologic deficit.  Moves extremities  Lymph: No obvious lymphadenopathy noted  skin: No obvious ecchymosis/lesions psych: Flat affect.  No signs of agitation.   Musculoskeletal: No obvious joint swelling/deformity     Data Reviewed: I have personally reviewed following labs and imaging studies  CBC: Recent Labs  Lab 12/08/21 2107 12/10/21 0129  WBC 3.6* 3.0*  NEUTROABS 1.8  --   HGB 11.9* 11.2*  HCT 34.2* 32.1*  MCV 80.7 79.7*  PLT 184 258    Basic Metabolic Panel: Recent Labs  Lab 12/08/21 2107 12/10/21 0129 12/11/21 0128 12/12/21 0339 12/13/21 0301  NA 141 136 137 134* 136  K  4.9 4.8 5.5* 4.6 4.9  CL 108 108 105 104 107  CO2 '25 23 27 24 '$ 20*  GLUCOSE 197* 283* 67* 141* 152*  BUN 38* 40* 44* 49* 57*  CREATININE 1.84* 1.76* 2.25* 1.95* 2.01*  CALCIUM 9.9 9.5 10.2 9.7 9.7  MG  --   --  2.5* 2.2 2.3    GFR: Estimated Creatinine Clearance: 24.3 mL/min (A) (by C-G formula based on SCr of 2.01 mg/dL  (H)). Liver Function Tests: Recent Labs  Lab 12/08/21 2107  AST 31  ALT 17  ALKPHOS 54  BILITOT 0.7  PROT 6.2*  ALBUMIN 3.4*    No results for input(s): "LIPASE", "AMYLASE" in the last 168 hours. No results for input(s): "AMMONIA" in the last 168 hours. Coagulation Profile: No results for input(s): "INR", "PROTIME" in the last 168 hours. Cardiac Enzymes: No results for input(s): "CKTOTAL", "CKMB", "CKMBINDEX", "TROPONINI" in the last 168 hours. BNP (last 3 results) No results for input(s): "PROBNP" in the last 8760 hours. HbA1C: No results for input(s): "HGBA1C" in the last 72 hours. CBG: Recent Labs  Lab 12/12/21 0922 12/12/21 1122 12/12/21 1618 12/12/21 2124 12/13/21 0728  GLUCAP 130* 163* 347* 183* 133*    Lipid Profile: No results for input(s): "CHOL", "HDL", "LDLCALC", "TRIG", "CHOLHDL", "LDLDIRECT" in the last 72 hours. Thyroid Function Tests: No results for input(s): "TSH", "T4TOTAL", "FREET4", "T3FREE", "THYROIDAB" in the last 72 hours.  Anemia Panel: No results for input(s): "VITAMINB12", "FOLATE", "FERRITIN", "TIBC", "IRON", "RETICCTPCT" in the last 72 hours. Sepsis Labs: No results for input(s): "PROCALCITON", "LATICACIDVEN" in the last 168 hours.  No results found for this or any previous visit (from the past 240 hour(s)).       Radiology Studies: No results found.      Scheduled Meds:  (feeding supplement) PROSource Plus  30 mL Oral BID BM   amiodarone  400 mg Oral BID   apixaban  5 mg Oral BID   vitamin C  500 mg Oral BID   atorvastatin  80 mg Oral QPM   clopidogrel  75 mg Oral Daily   colesevelam  1,875 mg Oral BID WC   insulin aspart  0-20 Units Subcutaneous TID WC   insulin aspart  0-5 Units Subcutaneous QHS   insulin glargine-yfgn  15 Units Subcutaneous QHS   multivitamin with minerals  1 tablet Oral Daily   sodium chloride flush  3 mL Intravenous Q12H   zinc sulfate  220 mg Oral Daily   Continuous  Infusions:        Aline August, MD [facility] 12/13/2021, 8:09 AM

## 2021-12-13 NOTE — Anesthesia Preprocedure Evaluation (Signed)
Anesthesia Evaluation  Patient identified by MRN, date of birth, ID band Patient awake    Reviewed: Allergy & Precautions, NPO status , Patient's Chart, lab work & pertinent test results  History of Anesthesia Complications Negative for: history of anesthetic complications  Airway Mallampati: III  TM Distance: >3 FB Neck ROM: Full    Dental  (+) Dental Advisory Given   Pulmonary neg pulmonary ROS,    breath sounds clear to auscultation       Cardiovascular hypertension, + CAD, + Past MI and +CHF   Rhythm:Irregular  1. Limited echo with limited images, no color doppler  2. Left ventricular ejection fraction, by estimation, is 55 to 60%. The  left ventricle has normal function. The left ventricle has no regional  wall motion abnormalities. There is moderate eccentric left ventricular  hypertrophy of the basal-septal  segment. There is moderate hypokinesis of the left ventricular, basal  inferior wall.    .  Prox LAD to Mid LAD lesion is 70% stenosed. .  Dist Cx lesion is 25% stenosed. .  Prox RCA lesion is 90% stenosed. .  Mid RCA lesion is 100% stenosed. .  1st Diag lesion is 75% stenosed. .  A drug-eluting stent was successfully placed using a STENT ONYX FRONTIER 3.0X30. [name]  Post intervention, there is a 0% residual stenosis.  Successful orbital atherectomy and stenting of the proximal LAD using a CSI 1.25 mm classic crown device for atherectomy and a 3.0 x 30 mm Onyx frontier drug-eluting stent   Recommend: ? Resume apixaban 5 mg twice daily tomorrow morning ? Continue clopidogrel 75 mg daily x6 months ? Avoid aspirin until clopidogrel is discontinued (high bleeding risk in a 76 year old diabetic woman on triple therapy)    Neuro/Psych TIA   GI/Hepatic Neg liver ROS, GERD  ,  Endo/Other  diabetes  Renal/GU CRFRenal diseaseLab Results      Component                Value               Date                       CREATININE               2.01 (H)            12/13/2021                Musculoskeletal  (+) Arthritis ,   Abdominal   Peds  Hematology  (+) Blood dyscrasia, anemia , Lab Results      Component                Value               Date                      WBC                      3.0 (L)             12/10/2021                HGB                      11.2 (L)            12/10/2021  HCT                      32.1 (L)            12/10/2021                MCV                      79.7 (L)            12/10/2021                PLT                      153                 12/10/2021            eliquis    Anesthesia Other Findings   Reproductive/Obstetrics                             Anesthesia Physical Anesthesia Plan  ASA: 3  Anesthesia Plan: General   Post-op Pain Management:    Induction: Intravenous  PONV Risk Score and Plan: 3 and Treatment may vary due to age or medical condition  Airway Management Planned: Mask  Additional Equipment: None  Intra-op Plan:   Post-operative Plan:   Informed Consent: I have reviewed the patients History and Physical, chart, labs and discussed the procedure including the risks, benefits and alternatives for the proposed anesthesia with the patient or authorized representative who has indicated his/her understanding and acceptance.     Dental advisory given  Plan Discussed with: CRNA  Anesthesia Plan Comments:         Anesthesia Quick Evaluation

## 2021-12-13 NOTE — Progress Notes (Signed)
Mobility Specialist Progress Note    12/13/21 1502  Mobility  Activity Ambulated with assistance in hallway  Level of Assistance Contact guard assist, steadying assist  Assistive Device Front wheel walker  Distance Ambulated (ft) 100 ft  Activity Response Tolerated well  $Mobility charge 1 Mobility   Pre-Mobility: 63 HR During Mobility: 83 HR Post-Mobility: 66 HR  Pt received in chair and agreeable. C/o toe pain. Returned to chair with call bell in reach.    Hildred Alamin Mobility Specialist

## 2021-12-13 NOTE — Anesthesia Procedure Notes (Signed)
Procedure Name: General with mask airway Date/Time: 12/13/2021 12:12 PM  Performed by: Dorann Lodge, CRNAPre-anesthesia Checklist: Patient identified, Emergency Drugs available, Suction available and Patient being monitored Patient Re-evaluated:Patient Re-evaluated prior to induction Oxygen Delivery Method: Ambu bag Preoxygenation: Pre-oxygenation with 100% oxygen Induction Type: IV induction Dental Injury: Teeth and Oropharynx as per pre-operative assessment

## 2021-12-13 NOTE — Progress Notes (Signed)
Progress Note  Patient Name: Cassandra Adams Date of Encounter: 12/13/2021  [facility] Cardiologist: [name], MD   Subjective   No acute overnight events. Patient is doing well this morning with no complaints. She denies any chest pain, shortness of breath, or palpitations. She remains in atrial flutter this morning. Plan is for DCCV this afternoon.  Inpatient Medications    Scheduled Meds:  (feeding supplement) PROSource Plus  30 mL Oral BID BM   amiodarone  400 mg Oral BID   apixaban  5 mg Oral BID   vitamin C  500 mg Oral BID   atorvastatin  80 mg Oral QPM   clopidogrel  75 mg Oral Daily   colesevelam  1,875 mg Oral BID WC   insulin aspart  0-20 Units Subcutaneous TID WC   insulin aspart  0-5 Units Subcutaneous QHS   insulin glargine-yfgn  15 Units Subcutaneous QHS   multivitamin with minerals  1 tablet Oral Daily   sodium chloride flush  3 mL Intravenous Q12H   zinc sulfate  220 mg Oral Daily   Continuous Infusions:  PRN Meds: acetaminophen **OR** acetaminophen, butalbital-acetaminophen-caffeine, hydrALAZINE, HYDROmorphone (DILAUDID) injection, naLOXone (NARCAN)  injection, ondansetron **OR** ondansetron (ZOFRAN) IV, traMADol   Vital Signs    Vitals:   12/12/21 0349 12/12/21 1619 12/12/21 2125 12/13/21 0546  BP: (!) 122/53 128/71 120/69 132/64  Pulse: 74 79 71 70  Resp: '19 18 16 20  '$ Temp: 97.9 F (36.6 C) 98.4 F (36.9 C) 98.6 F (37 C) 98.6 F (37 C)  TempSrc: Oral Oral Oral Oral  SpO2: 97% 99% 99% 100%  Weight:      Height:        Intake/Output Summary (Last 24 hours) at 12/13/2021 0656 Last data filed at 12/12/2021 2147 Gross per 24 hour  Intake 3 ml  Output --  Net 3 ml      12/09/2021    4:00 PM 10/21/2021    1:52 PM 10/13/2021   10:48 AM  Last 3 Weights  Weight (lbs) 166 lb 3.6 oz 163 lb 6.4 oz 158 lb  Weight (kg) 75.4 kg 74.118 kg 71.668 kg      Telemetry    Atrial flutter with rates in the 50s to 90s. - Personally  Reviewed  ECG    No new ECG tracing today. - Personally Reviewed  Physical Exam   GEN: No acute distress.   Neck: No JVD. Cardiac: Irregular rhythm. No murmurs, rubs, or gallops.  Respiratory: Clear to auscultation bilaterally. No wheezes, rhonchi, or rales. GI: Soft, non-distended, and non-tender. MS: No lower extremity edema. No deformity. Neuro:  No focal deficits. Psych: Normal affect. Responds appropriately.  Labs    High Sensitivity Troponin:   Recent Labs  Lab 12/08/21 2107 12/08/21 2348  TROPONINIHS 29* 27*     Chemistry Recent Labs  Lab 12/08/21 2107 12/10/21 0129 12/11/21 0128 12/12/21 0339 12/13/21 0301  NA 141   < > 137 134* 136  K 4.9   < > 5.5* 4.6 4.9  CL 108   < > 105 104 107  CO2 25   < > 27 24 20*  GLUCOSE 197*   < > 67* 141* 152*  BUN 38*   < > 44* 49* 57*  CREATININE 1.84*   < > 2.25* 1.95* 2.01*  CALCIUM 9.9   < > 10.2 9.7 9.7  MG  --   --  2.5* 2.2 2.3  PROT 6.2*  --   --   --   --  ALBUMIN 3.4*  --   --   --   --   AST 31  --   --   --   --   ALT 17  --   --   --   --   ALKPHOS 54  --   --   --   --   BILITOT 0.7  --   --   --   --   GFRNONAA 28*   < > 22* 26* 25*  ANIONGAP 8   < > '5 6 9   '$ < > = values in this interval not displayed.    Lipids No results for input(s): "CHOL", "TRIG", "HDL", "LABVLDL", "LDLCALC", "CHOLHDL" in the last 168 hours.  Hematology Recent Labs  Lab 12/08/21 2107 12/10/21 0129  WBC 3.6* 3.0*  RBC 4.24 4.03  HGB 11.9* 11.2*  HCT 34.2* 32.1*  MCV 80.7 79.7*  MCH 28.1 27.8  MCHC 34.8 34.9  RDW 15.4 15.2  PLT 184 153   Thyroid  Recent Labs  Lab 12/09/21 1444  TSH 0.436    BNPNo results for input(s): "BNP", "PROBNP" in the last 168 hours.  DDimer No results for input(s): "DDIMER" in the last 168 hours.   Radiology    No results found.  Cardiac Studies   Left Cardiac Catheterization 08/05/2021:   Prox LAD to Mid LAD lesion is 70% stenosed.   Dist Cx lesion is 25% stenosed.   Prox RCA lesion  is 90% stenosed.   Mid RCA lesion is 100% stenosed.   1st Diag lesion is 75% stenosed.   A drug-eluting stent was successfully placed using a STENT ONYX FRONTIER 3.0X30.   Post intervention, there is a 0% residual stenosis.   Successful orbital atherectomy and stenting of the proximal LAD using a CSI 1.25 mm classic crown device for atherectomy and a 3.0 x 30 mm Onyx frontier drug-eluting stent    Recommend: Resume apixaban 5 mg twice daily tomorrow morning Continue clopidogrel 75 mg daily x6 months Avoid aspirin until clopidogrel is discontinued (high bleeding risk in a 76 year old diabetic woman on triple therapy)  Diagnostic Dominance: Right  Intervention   _______________  Echo 09/15/2021: Impressions:  1. Limited echo with limited images, no color doppler   2. Left ventricular ejection fraction, by estimation, is 55 to 60%. The  left ventricle has normal function. The left ventricle has no regional  wall motion abnormalities. There is moderate eccentric left ventricular  hypertrophy of the basal-septal  segment. There is moderate hypokinesis of the left ventricular, basal  inferior wall.   Comparison(s): Prior images reviewed side by side. Changes from prior  study are noted. 08/04/2021: LVEF 60-65%, severe assymetric basal septal  hypertrophy.   Conclusion(s)/Recommendation(s): There is basal inferior wall hypokinesis in this study, however, this was noted in the prior study on my review as well. Overall, no new wall motion abnormalities.   Patient Profile     76 y.o. female with a history of CAD s/p orbital atherectomy and DES of proximal LAD in 01/8415, chronic diastolic CHF, paroxysmal atrial fibrillation on Eliquis, hypertension, hyperlipidemia, type 2 diabetes mellitus, prior TIA, CKD stage III who is being seen for evaluation of atrial flutter with RVR at request of Dr. Lorin Mercy.  Assessment & Plan    New Onset Atrial Flutter Paroxysmal Atrial Fibrillation Initially  presented in atrial flutter with RVR which is new for her. She was then noted to be in atrial fibrillation but looks like she is back in  atrial flutter this morning. Rates controlled in  - Continue PO Amiodarone load. Plan is for '400mg'$  daily for 2 weeks and then '200mg'$  daily thereafter. - Not on any AV nodal agents given history of bradycardia (heart rates were in the low 50s after she spontaneously converted in 08/2021). - Continue Eliquis '5mg'$  twice daily. - Plan is for DCCV today. She was consented for procedure yesterday.  CAD  S/p orbital atherectomy and DES to proximal LAD in 07/2021. Also has known CTO of RCA with collaterals. - No angina. - Continue Plavix '75mg'$  daily. No aspirin given need for DOAC. - Continue high-intensity statin.  Chronic Diastolic CHF Echo in 12/8467 showed LVEF of 55-60% with moderate LVH of the basal-septal segment and moderate hypokinesis of the basal inferior wall. - Euvolemic on exam. - Home Lasix, Entresto, Spironolactone, and Jardiance all on hold due to AKI.  Hypertension BP well controlled. - Home Entresto and Spironolactone on hold due to AKI.  Hyperlipidemia - Continue Lipitor '80mg'$  daily.   AKI on CKD Stage III Creatinine 1.84 on admission and peaked at 2.25 on 12/11/2021. Baseline around 1.3 to 1.6. - Creatinine 2.01 today, slightly up from 1.95 yesterday. - Continue to hold home diuretics, Entresto, and Jardiance.  Otherwise, per primary team: - Type 2 diabetes mellitus - Hyponatremia - Hyperkalemia s/p 1 dose of Lokelma on 6/18 - Anemia of chronic disease - Leukopenia   For questions or updates, please contact [facility] Please consult [email or web address] for contact info under        Signed, Darreld Mclean, PA-C  12/13/2021, 6:56 AM

## 2021-12-13 NOTE — Care Management Important Message (Signed)
Important Message  Patient Details  Name: Cassandra Adams MRN: [id number] Date of Birth: December 27, 1945   Medicare Important Message Given:  Yes     Shelda Altes 12/13/2021, 9:16 AM

## 2021-12-14 ENCOUNTER — Encounter (HOSPITAL_COMMUNITY): Payer: Self-pay | Admitting: Cardiovascular Disease

## 2021-12-14 DIAGNOSIS — I4891 Unspecified atrial fibrillation: Secondary | ICD-10-CM | POA: Diagnosis not present

## 2021-12-14 DIAGNOSIS — I2510 Atherosclerotic heart disease of native coronary artery without angina pectoris: Secondary | ICD-10-CM | POA: Diagnosis not present

## 2021-12-14 DIAGNOSIS — N179 Acute kidney failure, unspecified: Secondary | ICD-10-CM | POA: Diagnosis not present

## 2021-12-14 DIAGNOSIS — I5032 Chronic diastolic (congestive) heart failure: Secondary | ICD-10-CM | POA: Diagnosis not present

## 2021-12-14 LAB — BASIC METABOLIC PANEL
Anion gap: 7 (ref 5–15)
Anion gap: 4 — ABNORMAL LOW (ref 5–15)
BUN: 53 mg/dL — ABNORMAL HIGH (ref 8–23)
BUN: 51 mg/dL — ABNORMAL HIGH (ref 8–23)
CO2: 23 mmol/L (ref 22–32)
CO2: 26 mmol/L (ref 22–32)
Calcium: 10.2 mg/dL (ref 8.9–10.3)
Calcium: 10.1 mg/dL (ref 8.9–10.3)
Chloride: 107 mmol/L (ref 98–111)
Chloride: 110 mmol/L (ref 98–111)
Creatinine, Ser: 2.1 mg/dL — ABNORMAL HIGH (ref 0.44–1.00)
Creatinine, Ser: 2.03 mg/dL — ABNORMAL HIGH (ref 0.44–1.00)
GFR, Estimated: 24 mL/min — ABNORMAL LOW (ref >60)
GFR, Estimated: 25 mL/min — ABNORMAL LOW (ref >60)
Glucose, Bld: 167 mg/dL — ABNORMAL HIGH (ref 70–99)
Glucose, Bld: 138 mg/dL — ABNORMAL HIGH (ref 70–99)
Potassium: 5.5 mmol/L — ABNORMAL HIGH (ref 3.5–5.1)
Potassium: 5.9 mmol/L — ABNORMAL HIGH (ref 3.5–5.1)
Sodium: 137 mmol/L (ref 135–145)
Sodium: 140 mmol/L (ref 135–145)

## 2021-12-14 LAB — CBC WITH DIFFERENTIAL/PLATELET
Abs Immature Granulocytes: 0.01 10*3/uL (ref 0.00–0.07)
Basophils Absolute: 0 10*3/uL (ref 0.0–0.1)
Basophils Relative: 1 %
Eosinophils Absolute: 0.1 10*3/uL (ref 0.0–0.5)
Eosinophils Relative: 2 %
HCT: 32.2 % — ABNORMAL LOW (ref 36.0–46.0)
Hemoglobin: 11.6 g/dL — ABNORMAL LOW (ref 12.0–15.0)
Immature Granulocytes: 0 %
Lymphocytes Relative: 36 %
Lymphs Abs: 1.4 10*3/uL (ref 0.7–4.0)
MCH: 28.6 pg (ref 26.0–34.0)
MCHC: 36 g/dL (ref 30.0–36.0)
MCV: 79.5 fL — ABNORMAL LOW (ref 80.0–100.0)
Monocytes Absolute: 0.5 10*3/uL (ref 0.1–1.0)
Monocytes Relative: 13 %
Neutro Abs: 1.8 10*3/uL (ref 1.7–7.7)
Neutrophils Relative %: 48 %
Platelets: 164 10*3/uL (ref 150–400)
RBC: 4.05 MIL/uL (ref 3.87–5.11)
RDW: 15.1 % (ref 11.5–15.5)
WBC: 3.7 10*3/uL — ABNORMAL LOW (ref 4.0–10.5)
nRBC: 0 % (ref 0.0–0.2)

## 2021-12-14 LAB — GLUCOSE, CAPILLARY
Glucose-Capillary: 131 mg/dL — ABNORMAL HIGH (ref 70–99)
Glucose-Capillary: 151 mg/dL — ABNORMAL HIGH (ref 70–99)
Glucose-Capillary: 233 mg/dL — ABNORMAL HIGH (ref 70–99)
Glucose-Capillary: 288 mg/dL — ABNORMAL HIGH (ref 70–99)

## 2021-12-14 LAB — MAGNESIUM: Magnesium: 2.4 mg/dL (ref 1.7–2.4)

## 2021-12-14 MED ORDER — BLISTEX MEDICATED EX OINT
TOPICAL_OINTMENT | CUTANEOUS | Status: DC | PRN
Start: 2021-12-14 — End: 2021-12-15
  Filled 2021-12-14: qty 6.3

## 2021-12-14 MED ORDER — SODIUM ZIRCONIUM CYCLOSILICATE 5 G PO PACK
5.0000 g | PACK | Freq: Three times a day (TID) | ORAL | Status: DC
Start: 2021-12-14 — End: 2021-12-15
  Administered 2021-12-14 (×2): 5 g via ORAL
  Filled 2021-12-14 (×3): qty 1

## 2021-12-14 NOTE — Progress Notes (Signed)
[facility]  PROGRESS NOTE  Cassandra Adams WER:[id number] DOB: 04/25/1946 DOA: 12/08/2021 PCP: Sharilyn Sites, MD   Brief HPI:   76 year old female with medical history of chronic diastolic CHF, atrial fibrillation on anticoagulation with Eliquis, CAD, CKD stage IIIb, hypertension, diabetes mellitus type 2 presented with dizziness.  She was found to have atrial fibrillation with RVR.  Cardiology was consulted.    Subjective   Denies any pain or shortness of breath   Assessment/Plan:    New onset atrial flutter with RVR -Cardiology was consulted -Started on amiodarone -Cardio recommend to continue with amiodarone 400 mg daily until 6/29 then 200 mg daily thereafter -No calcium channel blocker beta-blocker due to history of bradycardia and sinus rhythm in the past -Continue Eliquis 5 mg p.o. twice daily  Chronic diastolic heart failure -Echocardiogram showed EF of 55 to 60% by echo in March -Patient is not volume overloaded -She has been on Entresto, spironolactone, Jardiance and Lasix at home -These medications on hold due to AKI -Can be restarted as outpatient once renal function normalizes  CAD Hyperlipidemia Hypertension -Status post DES in February.  Continue Plavix, WelChol and statin -Blood pressure currently stable.  Antihypertensives on hold for now.  Hyperkalemia -Potassium is elevated 5.9 -We will give Lokelma 5 g p.o. 3 times daily -Follow BMP in am  Diabetes mellitus type 2 -Last hemoglobin A1c 8.1 -Continue sliding scale insulin NovoLog, Lantus  Acute kidney injury on CKD stage IIIb -Baseline creatinine 1.4-1.6 -Creatinine today is 2.01  Anemia of chronic disease -Due to renal failure -Hemoglobin is stable      Medications     (feeding supplement) PROSource Plus  30 mL Oral BID BM   amiodarone  400 mg Oral Daily   apixaban  5 mg Oral BID   vitamin C  500 mg Oral BID   atorvastatin  80 mg Oral QPM   clopidogrel  75 mg Oral  Daily   colesevelam  1,875 mg Oral BID WC   insulin aspart  0-20 Units Subcutaneous TID WC   insulin aspart  0-5 Units Subcutaneous QHS   insulin glargine-yfgn  15 Units Subcutaneous QHS   multivitamin with minerals  1 tablet Oral Daily   sodium chloride flush  3 mL Intravenous Q12H   sodium zirconium cyclosilicate  5 g Oral TID   zinc sulfate  220 mg Oral Daily     Data Reviewed:   CBG:  Recent Labs  Lab 12/13/21 1650 12/13/21 2116 12/14/21 0740 12/14/21 1119 12/14/21 1538  GLUCAP 277* 349* 131* 151* 233*    SpO2: 100 %    Vitals:   12/13/21 2328 12/14/21 0514 12/14/21 0815 12/14/21 1645  BP: (!) 148/61 (!) 139/53 (!) 119/59 (!) 156/51  Pulse: 66  60 60  Resp: '20 15 18 18  '$ Temp: 98.3 F (36.8 C) 98.1 F (36.7 C) 98.7 F (37.1 C) 98.5 F (36.9 C)  TempSrc: Oral Oral Oral Oral  SpO2:  100% 98% 100%  Weight:      Height:          Data Reviewed:  Basic Metabolic Panel: Recent Labs  Lab 12/11/21 0128 12/12/21 0339 12/13/21 0301 12/14/21 0143 12/14/21 1151  NA 137 134* 136 137 140  K 5.5* 4.6 4.9 5.5* 5.9*  CL 105 104 107 107 110  CO2 27 24 20* 23 26  GLUCOSE 67* 141* 152* 167* 138*  BUN 44* 49* 57* 53* 51*  CREATININE 2.25* 1.95* 2.01* 2.10* 2.03*  CALCIUM  10.2 9.7 9.7 10.2 10.1  MG 2.5* 2.2 2.3 2.4  --     CBC: Recent Labs  Lab 12/08/21 2107 12/10/21 0129 12/14/21 0143  WBC 3.6* 3.0* 3.7*  NEUTROABS 1.8  --  1.8  HGB 11.9* 11.2* 11.6*  HCT 34.2* 32.1* 32.2*  MCV 80.7 79.7* 79.5*  PLT 184 153 164    LFT Recent Labs  Lab 12/08/21 2107  AST 31  ALT 17  ALKPHOS 54  BILITOT 0.7  PROT 6.2*  ALBUMIN 3.4*     Antibiotics: Anti-infectives (From admission, onward)    None        DVT prophylaxis: Apixaban  Code Status: Full code  Family Communication: No family at bedside   CONSULTS cardiology   Objective    Physical Examination:  General-appears in no acute distress Heart-S1-S2, regular, no murmur  auscultated Lungs-clear to auscultation bilaterally, no wheezing or crackles auscultated Abdomen-soft, nontender, no organomegaly Extremities-no edema in the lower extremities Neuro-alert, oriented x3, no focal deficit noted   Status is: Inpatient:             [place]   [facility] If 7PM-7AM, please contact night-coverage at [email or web address], Office  [phone]   12/14/2021, 5:24 PM  LOS: 4 days

## 2021-12-14 NOTE — Progress Notes (Signed)
Progress Note  Patient Name: Cassandra Adams Date of Encounter: 12/14/2021  [facility] Cardiologist: [name], MD   Subjective   Feeling well. No chest pain, sob or palpitations.    Inpatient Medications    Scheduled Meds:  (feeding supplement) PROSource Plus  30 mL Oral BID BM   amiodarone  400 mg Oral Daily   apixaban  5 mg Oral BID   vitamin C  500 mg Oral BID   atorvastatin  80 mg Oral QPM   clopidogrel  75 mg Oral Daily   colesevelam  1,875 mg Oral BID WC   insulin aspart  0-20 Units Subcutaneous TID WC   insulin aspart  0-5 Units Subcutaneous QHS   insulin glargine-yfgn  15 Units Subcutaneous QHS   multivitamin with minerals  1 tablet Oral Daily   sodium chloride flush  3 mL Intravenous Q12H   zinc sulfate  220 mg Oral Daily   Continuous Infusions:  PRN Meds: acetaminophen **OR** acetaminophen, butalbital-acetaminophen-caffeine, hydrALAZINE, HYDROmorphone (DILAUDID) injection, naLOXone (NARCAN)  injection, ondansetron **OR** ondansetron (ZOFRAN) IV, traMADol   Vital Signs    Vitals:   12/13/21 2117 12/13/21 2328 12/14/21 0514 12/14/21 0815  BP: (!) 131/53 (!) 148/61 (!) 139/53 (!) 119/59  Pulse: 60 66  60  Resp: '19 20 15 18  '$ Temp: 97.8 F (36.6 C) 98.3 F (36.8 C) 98.1 F (36.7 C) 98.7 F (37.1 C)  TempSrc: Oral Oral Oral Oral  SpO2: 100%  100% 98%  Weight:      Height:        Intake/Output Summary (Last 24 hours) at 12/14/2021 0929 Last data filed at 12/13/2021 2138 Gross per 24 hour  Intake 103 ml  Output --  Net 103 ml      12/09/2021    4:00 PM 10/21/2021    1:52 PM 10/13/2021   10:48 AM  Last 3 Weights  Weight (lbs) 166 lb 3.6 oz 163 lb 6.4 oz 158 lb  Weight (kg) 75.4 kg 74.118 kg 71.668 kg      Telemetry    Sinus bradycardia in 50s - Personally Reviewed  ECG    N/A  Physical Exam   GEN: No acute distress.   Neck: No JVD Cardiac: RRR, no murmurs, rubs, or gallops.  Respiratory: Clear to auscultation  bilaterally. GI: Soft, nontender, non-distended  MS: No edema; No deformity. Neuro:  Nonfocal  Psych: Normal affect   Labs    High Sensitivity Troponin:   Recent Labs  Lab 12/08/21 2107 12/08/21 2348  TROPONINIHS 29* 27*     Chemistry Recent Labs  Lab 12/08/21 2107 12/10/21 0129 12/12/21 0339 12/13/21 0301 12/14/21 0143  NA 141   < > 134* 136 137  K 4.9   < > 4.6 4.9 5.5*  CL 108   < > 104 107 107  CO2 25   < > 24 20* 23  GLUCOSE 197*   < > 141* 152* 167*  BUN 38*   < > 49* 57* 53*  CREATININE 1.84*   < > 1.95* 2.01* 2.10*  CALCIUM 9.9   < > 9.7 9.7 10.2  MG  --    < > 2.2 2.3 2.4  PROT 6.2*  --   --   --   --   ALBUMIN 3.4*  --   --   --   --   AST 31  --   --   --   --   ALT 17  --   --   --   --  ALKPHOS 54  --   --   --   --   BILITOT 0.7  --   --   --   --   GFRNONAA 28*   < > 26* 25* 24*  ANIONGAP 8   < > '6 9 7   '$ < > = values in this interval not displayed.    Hematology Recent Labs  Lab 12/08/21 2107 12/10/21 0129 12/14/21 0143  WBC 3.6* 3.0* 3.7*  RBC 4.24 4.03 4.05  HGB 11.9* 11.2* 11.6*  HCT 34.2* 32.1* 32.2*  MCV 80.7 79.7* 79.5*  MCH 28.1 27.8 28.6  MCHC 34.8 34.9 36.0  RDW 15.4 15.2 15.1  PLT 184 153 164   Thyroid  Recent Labs  Lab 12/09/21 1444  TSH 0.436     Radiology    No results found.  Cardiac Studies   Non this admission   Patient Profile     76 y.o. female with a history of CAD s/p orbital atherectomy and DES of proximal LAD in 09/5036, chronic diastolic CHF, paroxysmal atrial fibrillation on Eliquis, hypertension, hyperlipidemia, type 2 diabetes mellitus, prior TIA, CKD stage III who is being seen for evaluation of atrial flutter with RVR at request of Dr. Lorin Mercy.  Assessment & Plan    New Onset Atrial Flutter Paroxysmal Atrial Fibrillation Initially presented in atrial flutter with RVR  then noted to be in atrial fibrillation. Underwent successful DCCV yesterday. Maintaining sinus rhythm at rate of 50s.  -  Continue amiodarone load '400mg'$  daily until 6/29 and then '200mg'$  daily thereafter. - Not on any AV nodal agents given history of bradycardia (heart rates were in the low 50s after she spontaneously converted in 08/2021). - Continue Eliquis '5mg'$  twice daily.   CAD  S/p orbital atherectomy and DES to proximal LAD in 07/2021. Also has known CTO of RCA with collaterals. - No angina. - Continue Plavix '75mg'$  daily. No aspirin given need for DOAC. - Continue high-intensity statin.   Chronic Diastolic CHF Echo in 01/8279 showed LVEF of 55-60% with moderate LVH of the basal-septal segment and moderate hypokinesis of the basal inferior wall. - Euvolemic on exam. - Home Lasix, Entresto, Spironolactone, and Jardiance all on hold due to AKI.   Hypertension BP well controlled. - Home Entresto and Spironolactone on hold due to AKI.   Hyperlipidemia - Continue Lipitor '80mg'$  daily.    AKI on CKD Stage III Creatinine 1.84 on admission and peaked at 2.25 on 12/11/2021. Improved to 1.95 on 6/19 and since then increasing further 2.01>>2.10 today.  Baseline around 1.3 to 1.6. - Continue to hold home diuretics, Entresto, and Jardiance.  For questions or updates, please contact [place] Please consult [email or web address] for contact info under        SignedLeanor Kail, PA  12/14/2021, 9:29 AM

## 2021-12-14 NOTE — Anesthesia Postprocedure Evaluation (Signed)
Anesthesia Post Note  Patient: Cassandra Adams  Procedure(s) Performed: CARDIOVERSION     Patient location during evaluation: Endoscopy Anesthesia Type: General Level of consciousness: awake and alert Pain management: pain level controlled Vital Signs Assessment: post-procedure vital signs reviewed and stable Respiratory status: spontaneous breathing, nonlabored ventilation and respiratory function stable Cardiovascular status: blood pressure returned to baseline and stable Postop Assessment: no apparent nausea or vomiting Anesthetic complications: no   No notable events documented.  Last Vitals:  Vitals:   12/14/21 1645 12/14/21 1956  BP: (!) 156/51 (!) 125/49  Pulse: 60 (!) 52  Resp: 18   Temp: 36.9 C 36.9 C  SpO2: 100% 100%    Last Pain:  Vitals:   12/14/21 1956  TempSrc: Oral  PainSc:                  Cai Anfinson

## 2021-12-14 NOTE — Progress Notes (Signed)
Mobility Specialist Progress Note    12/14/21 1223  Mobility  Activity Ambulated independently in hallway  Level of Assistance Standby assist, set-up cues, supervision of patient - no hands on  Assistive Device Front wheel walker  Distance Ambulated (ft) 220 ft  Activity Response Tolerated well  $Mobility charge 1 Mobility   Pre-Mobility: 56 HR  Pt received in bed and agreeable. No complaints on walk. Returned to chair with call bell in reach.    Hildred Alamin Mobility Specialist

## 2021-12-14 NOTE — Inpatient Diabetes Management (Addendum)
Inpatient Diabetes Program Recommendations  AACE/ADA: New Consensus Statement on Inpatient Glycemic Control (2015)  Target Ranges:  Prepandial:   less than 140 mg/dL      Peak postprandial:   less than 180 mg/dL (1-2 hours)      Critically ill patients:  140 - 180 mg/dL    Latest Reference Range & Units 12/13/21 07:28 12/13/21 12:56 12/13/21 16:50 12/13/21 21:16  Glucose-Capillary 70 - 99 mg/dL 133 (H)  Pt RefusedNovolog   Was NPO 149 (H)  3 units Novolog 277 (H)  11 units Novolog '@1800'$  349 (H)  4 units Novolog  15 units Semglee  (H): Data is abnormally high    Home DM Meds: Basaglar 25 units QHS        Jardiance 10 mg daily        Novolog 6-9 units TID with meals   Current Orders: Semglee 15 units QHS Novolog 0-20 units TID ac/hs    MD- Note later afternoon CBGs tend to be elevated  Pt taking Novolog 6-9 units TID with meals at home  Please consider adding low dose Novolog Meal Coverage: Novolog 4 units TID with meals HOLD if pt eats <50% meals    --Will follow patient during hospitalization--  Wyn Quaker RN, MSN, CDE Diabetes Coordinator Inpatient Glycemic Control Team Team Pager: [phone] (8a-5p)

## 2021-12-15 DIAGNOSIS — I2510 Atherosclerotic heart disease of native coronary artery without angina pectoris: Secondary | ICD-10-CM | POA: Diagnosis not present

## 2021-12-15 DIAGNOSIS — I5032 Chronic diastolic (congestive) heart failure: Secondary | ICD-10-CM | POA: Diagnosis not present

## 2021-12-15 DIAGNOSIS — I10 Essential (primary) hypertension: Secondary | ICD-10-CM | POA: Diagnosis not present

## 2021-12-15 DIAGNOSIS — N179 Acute kidney failure, unspecified: Secondary | ICD-10-CM | POA: Diagnosis not present

## 2021-12-15 DIAGNOSIS — I2583 Coronary atherosclerosis due to lipid rich plaque: Secondary | ICD-10-CM | POA: Diagnosis not present

## 2021-12-15 DIAGNOSIS — N1832 Chronic kidney disease, stage 3b: Secondary | ICD-10-CM | POA: Diagnosis not present

## 2021-12-15 DIAGNOSIS — I4891 Unspecified atrial fibrillation: Secondary | ICD-10-CM | POA: Diagnosis not present

## 2021-12-15 DIAGNOSIS — I4819 Other persistent atrial fibrillation: Secondary | ICD-10-CM

## 2021-12-15 DIAGNOSIS — E1165 Type 2 diabetes mellitus with hyperglycemia: Secondary | ICD-10-CM | POA: Diagnosis not present

## 2021-12-15 LAB — GLUCOSE, CAPILLARY
Glucose-Capillary: 151 mg/dL — ABNORMAL HIGH (ref 70–99)
Glucose-Capillary: 146 mg/dL — ABNORMAL HIGH (ref 70–99)
Glucose-Capillary: 266 mg/dL — ABNORMAL HIGH (ref 70–99)

## 2021-12-15 LAB — BASIC METABOLIC PANEL WITH GFR
Anion gap: 3 — ABNORMAL LOW (ref 5–15)
BUN: 50 mg/dL — ABNORMAL HIGH (ref 8–23)
CO2: 23 mmol/L (ref 22–32)
Calcium: 9.5 mg/dL (ref 8.9–10.3)
Chloride: 107 mmol/L (ref 98–111)
Creatinine, Ser: 2 mg/dL — ABNORMAL HIGH (ref 0.44–1.00)
GFR, Estimated: 25 mL/min — ABNORMAL LOW (ref >60)
Glucose, Bld: 224 mg/dL — ABNORMAL HIGH (ref 70–99)
Potassium: 4.9 mmol/L (ref 3.5–5.1)
Sodium: 133 mmol/L — ABNORMAL LOW (ref 135–145)

## 2021-12-15 MED ORDER — AMIODARONE HCL 200 MG PO TABS: 200.0000 mg | ORAL_TABLET | Freq: Every day | ORAL | 2 refills | Status: DC

## 2021-12-15 MED ORDER — AMIODARONE HCL 400 MG PO TABS: 400.0000 mg | ORAL_TABLET | Freq: Every day | ORAL | 0 refills | Status: DC

## 2021-12-15 NOTE — Progress Notes (Signed)
Mobility Specialist Progress Note    12/15/21 1450  Mobility  Activity Ambulated with assistance in hallway  Level of Assistance Contact guard assist, steadying assist  Assistive Device Front wheel walker  Distance Ambulated (ft) 280 ft  Activity Response Tolerated well  $Mobility charge 1 Mobility   Pre-Mobility: 53 HR During Mobility: 77 HR Post-Mobility: 78 HR  Pt received in bed and agreeable. No complaints on walk. Returned to sit at sink for wash-up with call bell in reach. RN aware.   Hildred Alamin Mobility Specialist

## 2021-12-20 ENCOUNTER — Ambulatory Visit: Payer: Medicare HMO | Admitting: Physician Assistant

## 2021-12-20 ENCOUNTER — Encounter: Payer: Self-pay | Admitting: Physician Assistant

## 2021-12-20 VITALS — BP 158/62 | HR 61 | Ht 66.0 in | Wt 173.2 lb

## 2021-12-20 DIAGNOSIS — N183 Chronic kidney disease, stage 3 unspecified: Secondary | ICD-10-CM

## 2021-12-20 DIAGNOSIS — I10 Essential (primary) hypertension: Secondary | ICD-10-CM | POA: Diagnosis not present

## 2021-12-20 DIAGNOSIS — I480 Paroxysmal atrial fibrillation: Secondary | ICD-10-CM

## 2021-12-20 DIAGNOSIS — E785 Hyperlipidemia, unspecified: Secondary | ICD-10-CM

## 2021-12-20 DIAGNOSIS — I5033 Acute on chronic diastolic (congestive) heart failure: Secondary | ICD-10-CM | POA: Diagnosis not present

## 2021-12-20 DIAGNOSIS — I2510 Atherosclerotic heart disease of native coronary artery without angina pectoris: Secondary | ICD-10-CM

## 2021-12-20 LAB — BASIC METABOLIC PANEL
BUN/Creatinine Ratio: 21 (ref 12–28)
BUN: 40 mg/dL — ABNORMAL HIGH (ref 8–27)
CO2: 22 mmol/L (ref 20–29)
Calcium: 10.4 mg/dL — ABNORMAL HIGH (ref 8.7–10.3)
Chloride: 108 mmol/L — ABNORMAL HIGH (ref 96–106)
Creatinine, Ser: 1.89 mg/dL — ABNORMAL HIGH (ref 0.57–1.00)
Glucose: 68 mg/dL — ABNORMAL LOW (ref 70–99)
Potassium: 4.6 mmol/L (ref 3.5–5.2)
Sodium: 140 mmol/L (ref 134–144)
eGFR: 27 mL/min/{1.73_m2} — ABNORMAL LOW (ref >59)

## 2021-12-21 ENCOUNTER — Other Ambulatory Visit: Payer: Self-pay | Admitting: *Deleted

## 2021-12-21 DIAGNOSIS — Z79899 Other long term (current) drug therapy: Secondary | ICD-10-CM

## 2021-12-21 DIAGNOSIS — I5033 Acute on chronic diastolic (congestive) heart failure: Secondary | ICD-10-CM

## 2021-12-21 MED ORDER — SACUBITRIL-VALSARTAN 97-103 MG PO TABS: 0.5000 | ORAL_TABLET | Freq: Two times a day (BID) | ORAL | 3 refills | Status: DC

## 2021-12-31 ENCOUNTER — Other Ambulatory Visit: Payer: Self-pay | Admitting: Internal Medicine

## 2022-01-02 ENCOUNTER — Other Ambulatory Visit: Payer: Self-pay

## 2022-01-02 DIAGNOSIS — I5033 Acute on chronic diastolic (congestive) heart failure: Secondary | ICD-10-CM

## 2022-01-02 DIAGNOSIS — Z79899 Other long term (current) drug therapy: Secondary | ICD-10-CM

## 2022-01-02 LAB — BASIC METABOLIC PANEL
BUN/Creatinine Ratio: 22 (ref 12–28)
BUN: 35 mg/dL — ABNORMAL HIGH (ref 8–27)
CO2: 22 mmol/L (ref 20–29)
Calcium: 10.5 mg/dL — ABNORMAL HIGH (ref 8.7–10.3)
Chloride: 106 mmol/L (ref 96–106)
Creatinine, Ser: 1.61 mg/dL — ABNORMAL HIGH (ref 0.57–1.00)
Glucose: 160 mg/dL — ABNORMAL HIGH (ref 70–99)
Potassium: 4.7 mmol/L (ref 3.5–5.2)
Sodium: 140 mmol/L (ref 134–144)
eGFR: 33 mL/min/{1.73_m2} — ABNORMAL LOW (ref >59)

## 2022-01-04 ENCOUNTER — Encounter: Payer: Self-pay | Admitting: *Deleted

## 2022-01-26 NOTE — Progress Notes (Signed)
Cardiology Office Note:    Date:  02/02/2022   ID:  [name], [name] 13-Dec-1945, MRN [id number]  PCP:  Sharilyn Sites, [place] Providers Cardiologist:  [name], MD Cardiology APP:  Ledora Bottcher, [place] { Referring MD: Sharilyn Sites, MD   Chief Complaint  Patient presents with   Follow-up    CHF    History of Present Illness:    Cassandra Adams is a 76 y.o. female with a hx of CAD, hyperlipidemia, DM 2, CAD, PAF, CKD stage III, history of TIA, and recent cardioversion.  Heart catheterization 12/2013 showed occlusion of the mid RCA, 50% followed by 60 to 70% LAD lesion, normal left main and normal LCx.  Given lack of ongoing symptoms and preserved LV function RCA occlusion was treated medically.  Mild nonobstructive carotid artery stenosis by Dopplers. She was admitted after a fall at home in May 2022 and found to be in Afib RVR. Fall/syncope felt to be vasovagal. Echo with preserved EF, moderate asymmetric LVH, and possible small to moderate pericardial effusion without hemodynamic compromise. She was admitted back after discharge for coffee-ground emesis. EGD 11/08/20 with erosive gastropathy but no stigmata of bleeding. Eliquis resumed.   Successful DCCV 01/14/21.   She was admitted with recurrence of chest pain in Feb 2023. Repeat heart catheterization 08/04/2021 with atherectomy and DES to the proximal LAD and chronic subtotal occlusion of RCA treated medically.  She was discharged on Eliquis and clopidogrel x6 months.  Given her high bleeding risk, triple therapy was avoided.  Plan to discontinue Plavix after 6 months and resume aspirin.  Back in Afib RVR March 2023 in ER for upper abdominal/lower chest pain. She spontaneously converted. NO AV nodal agents given baseline bradycardia.   She was hospitalized 11/2021 when she was seen in the office with symptomatic atrial flutter with RVR. She underwent DCCV with amiodarone load and was discharged on  12/15/21. I saw her in follow up on 12/20/21 and she was maintaining sinus rhythm. Creatinine was improving but not back to baseline, I tentatively restarted 49-51 mg entresto BID. Follow up BMP with renal function back to baseline.   She presents with her daughter for follow up. She is back on her GDMT including high-dose Entresto, 5 mg Jardiance, 25 mg spironolactone, and 40 mg Lasix.  She denies shortness of breath, orthopnea, PND, chest pain, and syncope.  She is having issues with left leg swelling which is her normal swelling when she is having issues with hypervolemia.  She has been taking an extra Lasix for total of 80 mg Lasix daily for a few days now.  Weight log reveals increased weight to 171 this morning.  Dry weight felt to be 164.  I will check a BMP today, but I suspect she may need a higher baseline Lasix.   Past Medical History:  Diagnosis Date   (HFpEF) heart failure with preserved ejection fraction ([place])    Limited Echo 7/22: Mild asymmetrical LVH due to scar and thinning of basal posterior wall and background conc LVH, apical and mid segments hyperdynamic, EF 60-65, basal inf-lat and basal inf AK, normal RVSF, RVSP 26.6, mild LAE, mild-moderate MR, mild AV sclerosis w/o AS // Echo 5/22: EF 55-60, RVSP 27, mild to mod LAE, poss small to mod eff; AV sclerosis no AS   Arthritis    "all over"   CAD (coronary artery disease)    a. NSTEMI 12/2013 - occluded dominant RCA with  L-R collaterals, moderate prox segmental LAD disease, for medical therapy initially, EF 60% with subtle inferobasal hypokinesia. Consider PCI for refractory CP.   CKD (chronic kidney disease), stage III ([place])    Hypertension    Migraines    "weekly" (01/06/2014)   Sickle cell trait ([place])    TIA (transient ischemic attack) 1978   Type II diabetes mellitus ([place])    Vertigo     Past Surgical History:  Procedure Laterality Date   APPENDECTOMY  March 2014   had appendix frozen   CARDIAC CATHETERIZATION  "years  ago" & 01/05/2014   CARDIOVERSION N/A 03/17/2014   Procedure: CARDIOVERSION;  Surgeon: [name], MD;  Location: [place];  Service: Cardiovascular;  Laterality: N/A;   CARDIOVERSION N/A 01/14/2021   Procedure: CARDIOVERSION;  Surgeon: [name], MD;  Location: [facility] ENDOSCOPY;  Service: Cardiovascular;  Laterality: N/A;   CARDIOVERSION N/A 12/13/2021   Procedure: CARDIOVERSION;  Surgeon: Thayer Headings, MD;  Location: [place];  Service: Cardiovascular;  Laterality: N/A;   CATARACT EXTRACTION W/ INTRAOCULAR LENS  IMPLANT, BILATERAL Bilateral 04/2013-05/2013   CORONARY ATHERECTOMY N/A 08/05/2021   Procedure: CORONARY ATHERECTOMY;  Surgeon: Sherren Mocha, MD;  Location: [place] CV LAB;  Service: Cardiovascular;  Laterality: N/A;   CORONARY STENT INTERVENTION N/A 08/05/2021   Procedure: CORONARY STENT INTERVENTION;  Surgeon: Sherren Mocha, MD;  Location: [place] CV LAB;  Service: Cardiovascular;  Laterality: N/A;   ENDARTERECTOMY Right 02/05/2017   Procedure: ENDARTERECTOMY CAROTID-RIGHT;  Surgeon: [name], MD;  Location: [facility] OR;  Service: Vascular;  Laterality: Right;   ESOPHAGOGASTRODUODENOSCOPY  11/08/2007    Normal esophagus without evidence of Barrett, mass, erosion/ Normal stomach, duodenal bulb   ESOPHAGOGASTRODUODENOSCOPY (EGD) WITH PROPOFOL N/A 11/08/2020   Procedure: ESOPHAGOGASTRODUODENOSCOPY (EGD) WITH PROPOFOL;  Surgeon: Thornton Park, MD;  Location: [place];  Service: Gastroenterology;  Laterality: N/A;   GASTRIC MOTILITY STUDY  11/13/2007   mildly delayed emptying subjectively, but normal  analysis-77% of tracer emptied at 2 hours   INTRAVASCULAR PRESSURE WIRE/FFR STUDY N/A 08/04/2021   Procedure: INTRAVASCULAR PRESSURE WIRE/FFR STUDY;  Surgeon: Nelva Bush, MD;  Location: [place] CV LAB;  Service: Cardiovascular;  Laterality: N/A;   JOINT REPLACEMENT     LAPAROSCOPIC APPENDECTOMY N/A 09/15/2012   Procedure: APPENDECTOMY  LAPAROSCOPIC;  Surgeon: [name], MD;  Location: [facility];  Service: General;  Laterality: N/A;   LEFT HEART CATH AND CORONARY ANGIOGRAPHY N/A 08/04/2021   Procedure: LEFT HEART CATH AND CORONARY ANGIOGRAPHY;  Surgeon: Nelva Bush, MD;  Location: [place] CV LAB;  Service: Cardiovascular;  Laterality: N/A;   LEFT HEART CATHETERIZATION WITH CORONARY ANGIOGRAM N/A 01/05/2014   Procedure: LEFT HEART CATHETERIZATION WITH CORONARY ANGIOGRAM;  Surgeon: [name], MD;  Location: [facility] CATH LAB;  Service: Cardiovascular;  Laterality: N/A;   REPLACEMENT TOTAL KNEE Right 05-03-06   TEE WITHOUT CARDIOVERSION N/A 03/17/2014   Procedure: TRANSESOPHAGEAL ECHOCARDIOGRAM (TEE);  Surgeon: [name], MD;  Location: [facility] ENDOSCOPY;  Service: Cardiovascular;  Laterality: N/A;   TONSILLECTOMY  1972    Current Medications: Current Meds  Medication Sig   amiodarone (PACERONE) 200 MG tablet Take 1 tablet (200 mg total) by mouth daily. Start taking from 12/23/21   atorvastatin (LIPITOR) 80 MG tablet Take 1 tablet (80 mg total) by mouth every evening.   B-D ULTRAFINE III SHORT PEN 31G X 8 MM MISC SMARTSIG:Injection Daily   BD INSULIN SYRINGE U/F 31G X 5/16" 0.5 ML MISC Use to take  with Novolog TIDAC   clopidogrel (PLAVIX) 75 MG tablet Take 1 tablet (75 mg total) by mouth daily.   colesevelam (WELCHOL) 625 MG tablet Take 1,875 mg by mouth 2 (two) times daily with a meal. Noon and night   Continuous Blood Gluc Receiver (FREESTYLE LIBRE 2 READER) DEVI 1 each by Does not apply route daily in the afternoon.   Continuous Blood Gluc Sensor (FREESTYLE LIBRE 2 SENSOR) MISC 1 each by Does not apply route daily in the afternoon.   ELIQUIS 5 MG TABS tablet Take 1 tablet (5 mg total) by mouth 2 (two) times daily.   empagliflozin (JARDIANCE) 10 MG TABS tablet Take 5 mg by mouth daily. Take 0.5 Half Tablet Daily   furosemide (LASIX) 40 MG tablet Take 40 mg by mouth 2 (two) times daily.   Insulin Glargine (BASAGLAR  KWIKPEN) 100 UNIT/ML Inject 10 Units into the skin at bedtime. (Patient taking differently: Inject 25 Units into the skin at bedtime.)   nitroGLYCERIN (NITROSTAT) 0.4 MG SL tablet PLACE 1 TABLET UNDER THE TONGUE EVERY 5 MINUTES AS NEEDED FOR CHEST PAIN (UP TO 3 DOSES)   NOVOLOG 100 UNIT/ML injection Inject 6-9 Units into the skin 3 (three) times daily with meals. Sliding Scale   ONETOUCH ULTRA test strip TESTING 3 TIMES DAILY   polyethylene glycol (MIRALAX / GLYCOLAX) 17 g packet Take 17 g by mouth daily. (Patient taking differently: Take 17 g by mouth daily as needed.)   sacubitril-valsartan (ENTRESTO) 97-103 MG Take 0.5 tablets by mouth 2 (two) times daily. Start 12/28/21-STOP lasix when started   spironolactone (ALDACTONE) 25 MG tablet Take 25 mg by mouth daily.   Vitamin D, Cholecalciferol, 25 MCG (1000 UT) TABS Take 2,000 Units by mouth daily.     Allergies:   Penicillins   Social History   Socioeconomic History   Marital status: Single    Spouse name: Not on file   Number of children: Not on file   Years of education: Not on file   Highest education level: Not on file  Occupational History   Occupation: retired  Tobacco Use   Smoking status: Never   Smokeless tobacco: Never  Vaping Use   Vaping Use: Never used  Substance and Sexual Activity   Alcohol use: Yes    Comment: rare   Drug use: No   Sexual activity: Not Currently    Birth control/protection: Post-menopausal  Other Topics Concern   Not on file  Social History Narrative   Not on file   Social Determinants of Health   Financial Resource Strain: Not on file  Food Insecurity: Not on file  Transportation Needs: Not on file  Physical Activity: Not on file  Stress: Not on file  Social Connections: Not on file     Family History: The patient's family history includes Cancer in her father; Diabetes in her brother; Heart attack in her brother and mother; Heart disease in her brother; Hyperlipidemia in her brother  and mother; Hypertension in her brother, mother, and sister; Other in her brother.  ROS:   Please see the history of present illness.     All other systems reviewed and are negative.  EKGs/Labs/Other Studies Reviewed:    The following studies were reviewed today:  Echo 08/2021:  1. Limited echo with limited images, no color doppler   2. Left ventricular ejection fraction, by estimation, is 55 to 60%. The  left ventricle has normal function. The left ventricle has no regional  wall motion  abnormalities. There is moderate eccentric left ventricular  hypertrophy of the basal-septal  segment. There is moderate hypokinesis of the left ventricular, basal  inferior wall.   EKG:  EKG is not ordered today.    Recent Labs: 08/02/2021: B Natriuretic Peptide 95.0 12/08/2021: ALT 17 12/09/2021: TSH 0.436 12/14/2021: Hemoglobin 11.6; Magnesium 2.4; Platelets 164 01/02/2022: BUN 35; Creatinine, Ser 1.61; Potassium 4.7; Sodium 140  Recent Lipid Panel    Component Value Date/Time   CHOL 144 01/16/2021 0933   CHOL 128 06/09/2020 0802   TRIG 61 01/16/2021 0933   HDL 69 01/16/2021 0933   HDL 61 06/09/2020 0802   CHOLHDL 2.1 01/16/2021 0933   VLDL 12 01/16/2021 0933   LDLCALC 63 01/16/2021 0933   LDLCALC 52 06/09/2020 0802     Risk Assessment/Calculations:           Physical Exam:    VS:  BP (!) 148/64 (BP Location: Left Arm, Patient Position: Sitting, Cuff Size: Normal)   Pulse (!) 53   Ht '5\' 6"'$  (1.676 m)   Wt 160 lb 12.8 oz (72.9 kg)   SpO2 98%   BMI 25.95 kg/m     Wt Readings from Last 3 Encounters:  02/02/22 160 lb 12.8 oz (72.9 kg)  12/20/21 173 lb 3.2 oz (78.6 kg)  12/09/21 166 lb 3.6 oz (75.4 kg)     GEN:  Well nourished, well developed in no acute distress HEENT: Normal NECK: No JVD; No carotid bruits LYMPHATICS: No lymphadenopathy CARDIAC: RRR, no murmurs, rubs, gallops RESPIRATORY:  Clear to auscultation without rales, wheezing or rhonchi  ABDOMEN: Soft, non-tender,  non-distended MUSCULOSKELETAL:  left lower extremity edema SKIN: Warm and dry NEUROLOGIC:  Alert and oriented x 3 PSYCHIATRIC:  Normal affect   ASSESSMENT:    1. Essential hypertension   2. PAF (paroxysmal atrial fibrillation) ([place])   3. Chronic anticoagulation   4. Chronic diastolic CHF (congestive heart failure) ([place])   5. Hypertensive urgency   6. CKD stage 3 due to type 2 diabetes mellitus ([place])   7. Coronary artery disease involving native coronary artery of native heart without angina pectoris   8. Hyperlipidemia LDL goal <70    PLAN:    In order of problems listed above:  Atrial fibrillation with RVR Atrial flutter with RVR Chronic anticoagulation - DCCV 12/13/21 - on 200 mg amiodarone daily  - continue eliquis   Acute on Chronic diastolic heart failure Hypertension CKD - GDMT was held at discharge for sCr of 2.1, baseline 1.3-1.6 - she is back on her home medications including high dose entresto, 25 mg spironolactone, 5 mg jardiance, and 40 mg lasix - due to swelling of her left leg and weight gain, she has been taking 80 mg lasix in the morning  - weight log appears like her dry weight is close to 164 lbs but she is now 171 - Continue 80 mg Lasix for at least 7 days or until her dry weight is consistently 164 pounds, then reduce to 60 mg Lasix - I suspect that she will need a higher baseline Lasix for now   CAD Hyperlipidemia S/P orbital atherectomy and DES-proximal LAD 07/2021 CTO of RCA Continue plavix, no ASA given [place] Continue statin, no BB - will reach out to primary cardiologist regarding transition of plavix to ASA in Aug 2023 (6 months post procedure)   BMP today. Follow up in 3 months.        Medication Adjustments/Labs and Tests Ordered: Current medicines are  reviewed at length with the patient today.  Concerns regarding medicines are outlined above.  Orders Placed This Encounter  Procedures   Basic metabolic panel   No orders of the  defined types were placed in this encounter.   Patient Instructions  Medication Instructions:  Lasix 40 mg ( Take 80 mg of Lasix for Seven (7) Days or Until weight is consistently 167 lbs. Then Take 60 mg of Lasix Daily). *If you need a refill on your cardiac medications before your next appointment, please call your pharmacy*   Lab Work: BMET Today If you have labs (blood work) drawn today and your tests are completely normal, you will receive your results only by: [place] (if you have MyChart) OR A paper copy in the mail If you have any lab test that is abnormal or we need to change your treatment, we will call you to review the results.   Testing/Procedures: No Testing   Follow-Up: At [facility], you and your health needs are our priority.  As part of our continuing mission to provide you with exceptional heart care, we have created designated Provider Care Teams.  These Care Teams include your primary Cardiologist (physician) and Advanced Practice Providers (APPs -  Physician Assistants and Nurse Practitioners) who all work together to provide you with the care you need, when you need it.  We recommend signing up for the patient portal called "MyChart".  Sign up information is provided on this After Visit Summary.  MyChart is used to connect with patients for Virtual Visits (Telemedicine).  Patients are able to view lab/test results, encounter notes, upcoming appointments, etc.  Non-urgent messages can be sent to your provider as well.   To learn more about what you can do with MyChart, go to [email or web address].    Your next appointment:   3 month(s)  The format for your next appointment:   In Person  Provider:   [name], MD       Signed, [place], [place]  02/02/2022 5:12 PM    [place]

## 2022-01-30 ENCOUNTER — Encounter (INDEPENDENT_AMBULATORY_CARE_PROVIDER_SITE_OTHER): Payer: Self-pay

## 2022-01-30 DIAGNOSIS — Z794 Long term (current) use of insulin: Secondary | ICD-10-CM | POA: Diagnosis not present

## 2022-01-30 DIAGNOSIS — E113211 Type 2 diabetes mellitus with mild nonproliferative diabetic retinopathy with macular edema, right eye: Secondary | ICD-10-CM | POA: Diagnosis not present

## 2022-01-30 DIAGNOSIS — H524 Presbyopia: Secondary | ICD-10-CM | POA: Diagnosis not present

## 2022-01-30 DIAGNOSIS — Z961 Presence of intraocular lens: Secondary | ICD-10-CM | POA: Diagnosis not present

## 2022-02-02 ENCOUNTER — Encounter: Payer: Self-pay | Admitting: Physician Assistant

## 2022-02-02 ENCOUNTER — Ambulatory Visit: Payer: Medicare HMO | Admitting: Physician Assistant

## 2022-02-02 VITALS — BP 148/64 | HR 53 | Ht 66.0 in | Wt 160.8 lb

## 2022-02-02 DIAGNOSIS — N183 Chronic kidney disease, stage 3 unspecified: Secondary | ICD-10-CM

## 2022-02-02 DIAGNOSIS — I10 Essential (primary) hypertension: Secondary | ICD-10-CM

## 2022-02-02 DIAGNOSIS — I160 Hypertensive urgency: Secondary | ICD-10-CM

## 2022-02-02 DIAGNOSIS — E1122 Type 2 diabetes mellitus with diabetic chronic kidney disease: Secondary | ICD-10-CM | POA: Diagnosis not present

## 2022-02-02 DIAGNOSIS — E785 Hyperlipidemia, unspecified: Secondary | ICD-10-CM

## 2022-02-02 DIAGNOSIS — I5032 Chronic diastolic (congestive) heart failure: Secondary | ICD-10-CM | POA: Diagnosis not present

## 2022-02-02 DIAGNOSIS — I480 Paroxysmal atrial fibrillation: Secondary | ICD-10-CM | POA: Diagnosis not present

## 2022-02-02 DIAGNOSIS — Z7901 Long term (current) use of anticoagulants: Secondary | ICD-10-CM | POA: Diagnosis not present

## 2022-02-02 DIAGNOSIS — I2510 Atherosclerotic heart disease of native coronary artery without angina pectoris: Secondary | ICD-10-CM | POA: Diagnosis not present

## 2022-02-02 NOTE — Patient Instructions (Signed)
Medication Instructions:  Lasix 40 mg ( Take 80 mg of Lasix for Seven (7) Days or Until weight is consistently 167 lbs. Then Take 60 mg of Lasix Daily). *If you need a refill on your cardiac medications before your next appointment, please call your pharmacy*   Lab Work: BMET Today If you have labs (blood work) drawn today and your tests are completely normal, you will receive your results only by: [place] (if you have MyChart) OR A paper copy in the mail If you have any lab test that is abnormal or we need to change your treatment, we will call you to review the results.   Testing/Procedures: No Testing   Follow-Up: At [facility], you and your health needs are our priority.  As part of our continuing mission to provide you with exceptional heart care, we have created designated Provider Care Teams.  These Care Teams include your primary Cardiologist (physician) and Advanced Practice Providers (APPs -  Physician Assistants and Nurse Practitioners) who all work together to provide you with the care you need, when you need it.  We recommend signing up for the patient portal called "MyChart".  Sign up information is provided on this After Visit Summary.  MyChart is used to connect with patients for Virtual Visits (Telemedicine).  Patients are able to view lab/test results, encounter notes, upcoming appointments, etc.  Non-urgent messages can be sent to your provider as well.   To learn more about what you can do with MyChart, go to [email or web address].    Your next appointment:   3 month(s)  The format for your next appointment:   In Person  Provider:   [name], MD

## 2022-02-03 ENCOUNTER — Other Ambulatory Visit: Payer: Self-pay

## 2022-02-03 DIAGNOSIS — E875 Hyperkalemia: Secondary | ICD-10-CM

## 2022-02-03 LAB — BASIC METABOLIC PANEL
BUN/Creatinine Ratio: 21 (ref 12–28)
BUN: 40 mg/dL — ABNORMAL HIGH (ref 8–27)
CO2: 23 mmol/L (ref 20–29)
Calcium: 10.7 mg/dL — ABNORMAL HIGH (ref 8.7–10.3)
Chloride: 102 mmol/L (ref 96–106)
Creatinine, Ser: 1.93 mg/dL — ABNORMAL HIGH (ref 0.57–1.00)
Glucose: 68 mg/dL — ABNORMAL LOW (ref 70–99)
Potassium: 5.7 mmol/L — ABNORMAL HIGH (ref 3.5–5.2)
Sodium: 139 mmol/L (ref 134–144)
eGFR: 26 mL/min/{1.73_m2} — ABNORMAL LOW (ref >59)

## 2022-02-06 ENCOUNTER — Encounter (INDEPENDENT_AMBULATORY_CARE_PROVIDER_SITE_OTHER): Payer: Medicare HMO | Admitting: Ophthalmology

## 2022-02-06 ENCOUNTER — Ambulatory Visit (INDEPENDENT_AMBULATORY_CARE_PROVIDER_SITE_OTHER): Payer: Medicare HMO | Admitting: Ophthalmology

## 2022-02-06 ENCOUNTER — Encounter (INDEPENDENT_AMBULATORY_CARE_PROVIDER_SITE_OTHER): Payer: Self-pay | Admitting: Ophthalmology

## 2022-02-06 DIAGNOSIS — E113491 Type 2 diabetes mellitus with severe nonproliferative diabetic retinopathy without macular edema, right eye: Secondary | ICD-10-CM | POA: Insufficient documentation

## 2022-02-06 DIAGNOSIS — E113412 Type 2 diabetes mellitus with severe nonproliferative diabetic retinopathy with macular edema, left eye: Secondary | ICD-10-CM | POA: Diagnosis not present

## 2022-02-06 NOTE — Progress Notes (Signed)
02/06/2022     CHIEF COMPLAINT Patient presents for  Chief Complaint  Patient presents with   Diabetic Retinopathy with Macular Edema      HISTORY OF PRESENT ILLNESS: Cassandra Adams is a 76 y.o. female who presents to the clinic today for:   HPI   Np - diabetic retinopathy ou ref by shapiro Pt states her vision has not been stable Pt denies any new floaters but does something have FOL that look like stars Pt states her vision is very blurry and feels gritty all the time  Pt states her vision has been like this for a few years but she cannot take it anymore Last edited by Morene Rankins, CMA on 02/06/2022  2:32 PM.      Referring physician: Sharilyn Sites, MD [address] [place],  [place] [address]  HISTORICAL INFORMATION:   Selected notes from the [place]    Lab Results  Component Value Date   HGBA1C 8.1 (H) 06/11/2021     CURRENT MEDICATIONS: No current outpatient medications on file. (Ophthalmic Drugs)   No current facility-administered medications for this visit. (Ophthalmic Drugs)   Current Outpatient Medications (Other)  Medication Sig   amiodarone (PACERONE) 200 MG tablet Take 1 tablet (200 mg total) by mouth daily. Start taking from 12/23/21   atorvastatin (LIPITOR) 80 MG tablet Take 1 tablet (80 mg total) by mouth every evening.   B-D ULTRAFINE III SHORT PEN 31G X 8 MM MISC SMARTSIG:Injection Daily   BD INSULIN SYRINGE U/F 31G X 5/16" 0.5 ML MISC Use to take with Novolog [facility]   clopidogrel (PLAVIX) 75 MG tablet Take 1 tablet (75 mg total) by mouth daily.   colesevelam (WELCHOL) 625 MG tablet Take 1,875 mg by mouth 2 (two) times daily with a meal. Noon and night   Continuous Blood Gluc Receiver (FREESTYLE LIBRE 2 READER) DEVI 1 each by Does not apply route daily in the afternoon.   Continuous Blood Gluc Sensor (FREESTYLE LIBRE 2 SENSOR) MISC 1 each by Does not apply route daily in the afternoon.   ELIQUIS 5 MG TABS tablet Take 1  tablet (5 mg total) by mouth 2 (two) times daily.   empagliflozin (JARDIANCE) 10 MG TABS tablet Take 5 mg by mouth daily. Take 0.5 Half Tablet Daily   furosemide (LASIX) 40 MG tablet Take 40 mg by mouth 2 (two) times daily.   Insulin Glargine (BASAGLAR KWIKPEN) 100 UNIT/ML Inject 10 Units into the skin at bedtime. (Patient taking differently: Inject 25 Units into the skin at bedtime.)   nitroGLYCERIN (NITROSTAT) 0.4 MG SL tablet PLACE 1 TABLET UNDER THE TONGUE EVERY 5 MINUTES AS NEEDED FOR CHEST PAIN (UP TO 3 DOSES)   NOVOLOG 100 UNIT/ML injection Inject 6-9 Units into the skin 3 (three) times daily with meals. Sliding Scale   ONETOUCH ULTRA test strip TESTING 3 TIMES DAILY   polyethylene glycol (MIRALAX / GLYCOLAX) 17 g packet Take 17 g by mouth daily. (Patient taking differently: Take 17 g by mouth daily as needed.)   sacubitril-valsartan (ENTRESTO) 97-103 MG Take 0.5 tablets by mouth 2 (two) times daily. Start 12/28/21-STOP lasix when started   Vitamin D, Cholecalciferol, 25 MCG (1000 UT) TABS Take 2,000 Units by mouth daily.   No current facility-administered medications for this visit. (Other)      REVIEW OF SYSTEMS: ROS   Negative for: Constitutional, Gastrointestinal, Neurological, Skin, Genitourinary, Musculoskeletal, HENT, Endocrine, Cardiovascular, Eyes, Respiratory, Psychiatric, Allergic/Imm, Heme/Lymph Last edited by Phineas Douglas,  Destiny D, CMA on 02/06/2022  2:32 PM.       ALLERGIES Allergies  Allergen Reactions   Penicillins Hives    Has patient had a PCN reaction causing immediate rash, facial/tongue/throat swelling, SOB or lightheadedness with hypotension: no Has patient had a PCN reaction causing severe rash involving mucus membranes or skin necrosis: No  Has patient had a PCN reaction that required hospitalization: no Has patient had a PCN reaction occurring within the last 10 years: no If all of the above answers are "NO", then may proceed with Cephalosporin use.      PAST MEDICAL HISTORY Past Medical History:  Diagnosis Date   (HFpEF) heart failure with preserved ejection fraction ([place])    Limited Echo 7/22: Mild asymmetrical LVH due to scar and thinning of basal posterior wall and background conc LVH, apical and mid segments hyperdynamic, EF 60-65, basal inf-lat and basal inf AK, normal RVSF, RVSP 26.6, mild LAE, mild-moderate MR, mild AV sclerosis w/o AS // Echo 5/22: EF 55-60, RVSP 27, mild to mod LAE, poss small to mod eff; AV sclerosis no AS   Arthritis    "all over"   CAD (coronary artery disease)    a. NSTEMI 12/2013 - occluded dominant RCA with L-R collaterals, moderate prox segmental LAD disease, for medical therapy initially, EF 60% with subtle inferobasal hypokinesia. Consider PCI for refractory CP.   CKD (chronic kidney disease), stage III ([place])    Hypertension    Migraines    "weekly" (01/06/2014)   Sickle cell trait ([place])    TIA (transient ischemic attack) 1978   Type II diabetes mellitus ([place])    Vertigo    Past Surgical History:  Procedure Laterality Date   APPENDECTOMY  March 2014   had appendix frozen   CARDIAC CATHETERIZATION  "years ago" & 01/05/2014   CARDIOVERSION N/A 03/17/2014   Procedure: CARDIOVERSION;  Surgeon: [name], MD;  Location: [place];  Service: Cardiovascular;  Laterality: N/A;   CARDIOVERSION N/A 01/14/2021   Procedure: CARDIOVERSION;  Surgeon: [name], MD;  Location: [facility] ENDOSCOPY;  Service: Cardiovascular;  Laterality: N/A;   CARDIOVERSION N/A 12/13/2021   Procedure: CARDIOVERSION;  Surgeon: Thayer Headings, MD;  Location: [place];  Service: Cardiovascular;  Laterality: N/A;   CATARACT EXTRACTION W/ INTRAOCULAR LENS  IMPLANT, BILATERAL Bilateral 04/2013-05/2013   CORONARY ATHERECTOMY N/A 08/05/2021   Procedure: CORONARY ATHERECTOMY;  Surgeon: Sherren Mocha, MD;  Location: [place] CV LAB;  Service: Cardiovascular;  Laterality: N/A;   CORONARY STENT INTERVENTION N/A 08/05/2021    Procedure: CORONARY STENT INTERVENTION;  Surgeon: Sherren Mocha, MD;  Location: [place] CV LAB;  Service: Cardiovascular;  Laterality: N/A;   ENDARTERECTOMY Right 02/05/2017   Procedure: ENDARTERECTOMY CAROTID-RIGHT;  Surgeon: [name], MD;  Location: [facility] OR;  Service: Vascular;  Laterality: Right;   ESOPHAGOGASTRODUODENOSCOPY  11/08/2007    Normal esophagus without evidence of Barrett, mass, erosion/ Normal stomach, duodenal bulb   ESOPHAGOGASTRODUODENOSCOPY (EGD) WITH PROPOFOL N/A 11/08/2020   Procedure: ESOPHAGOGASTRODUODENOSCOPY (EGD) WITH PROPOFOL;  Surgeon: Thornton Park, MD;  Location: [place];  Service: Gastroenterology;  Laterality: N/A;   GASTRIC MOTILITY STUDY  11/13/2007   mildly delayed emptying subjectively, but normal  analysis-77% of tracer emptied at 2 hours   INTRAVASCULAR PRESSURE WIRE/FFR STUDY N/A 08/04/2021   Procedure: INTRAVASCULAR PRESSURE WIRE/FFR STUDY;  Surgeon: Nelva Bush, MD;  Location: [place] CV LAB;  Service: Cardiovascular;  Laterality: N/A;   JOINT REPLACEMENT     LAPAROSCOPIC APPENDECTOMY  N/A 09/15/2012   Procedure: APPENDECTOMY LAPAROSCOPIC;  Surgeon: [name], MD;  Location: [facility];  Service: General;  Laterality: N/A;   LEFT HEART CATH AND CORONARY ANGIOGRAPHY N/A 08/04/2021   Procedure: LEFT HEART CATH AND CORONARY ANGIOGRAPHY;  Surgeon: Nelva Bush, MD;  Location: [place] CV LAB;  Service: Cardiovascular;  Laterality: N/A;   LEFT HEART CATHETERIZATION WITH CORONARY ANGIOGRAM N/A 01/05/2014   Procedure: LEFT HEART CATHETERIZATION WITH CORONARY ANGIOGRAM;  Surgeon: [name], MD;  Location: [facility] CATH LAB;  Service: Cardiovascular;  Laterality: N/A;   REPLACEMENT TOTAL KNEE Right 05-03-06   TEE WITHOUT CARDIOVERSION N/A 03/17/2014   Procedure: TRANSESOPHAGEAL ECHOCARDIOGRAM (TEE);  Surgeon: [name], MD;  Location: [facility] ENDOSCOPY;  Service: Cardiovascular;  Laterality: N/A;   TONSILLECTOMY  1972    FAMILY  HISTORY Family History  Problem Relation Age of Onset   Hyperlipidemia Mother    Hypertension Mother    Heart attack Mother    Cancer Father        lung   Hypertension Sister    Diabetes Brother    Heart disease Brother    Hyperlipidemia Brother    Hypertension Brother    Heart attack Brother    Other Brother        DVT    SOCIAL HISTORY Social History   Tobacco Use   Smoking status: Never   Smokeless tobacco: Never  Vaping Use   Vaping Use: Never used  Substance Use Topics   Alcohol use: Yes    Comment: rare   Drug use: No         OPHTHALMIC EXAM:  Base Eye Exam     Visual Acuity (ETDRS)       Right Left   Dist [place] 20/70 +1 20/50    Correction: Glasses         Tonometry (Tonopen, 2:39 PM)       Right Left   Pressure 21 17         Pupils       Pupils Shape   Right PERRL Round   Left PERRL          Extraocular Movement       Right Left    Ortho Ortho    -- -- --  --  --  -- -- --   -- -- --  --  --  -- -- --           Dilation     Both eyes: 1.0% Mydriacyl, 2.5% Phenylephrine @ 2:34 PM           Slit Lamp and Fundus Exam     External Exam       Right Left   External Normal Normal         Slit Lamp Exam       Right Left   Lids/Lashes Normal Normal   Conjunctiva/Sclera White and quiet White and quiet   Cornea Clear Clear   Anterior Chamber Deep and quiet Deep and quiet   Iris Round and reactive Round and reactive   Lens Centered posterior chamber intraocular lens Centered posterior chamber intraocular lens   Anterior Vitreous Normal Normal         Fundus Exam       Right Left   Posterior Vitreous Posterior vitreous detachment Posterior vitreous detachment   Disc Normal Normal   C/D Ratio 0.75 0.75   Macula Microaneurysms, no clinically significant macular edema Microaneurysms, no clinically significant  macular edema   Vessels NPDR-Severe NPDR-Severe   Periphery Normal large region of white lines of  nonperfusion temporal perfusion window, watershed region of peripheral retina. Normal            IMAGING AND PROCEDURES  Imaging and Procedures for 02/06/22  OCT, Retina - OU - Both Eyes       Right Eye Quality was good. Scan locations included subfoveal. Central Foveal Thickness: 214. Progression has no prior data. Findings include abnormal foveal contour.   Left Eye Quality was good. Scan locations included subfoveal. Central Foveal Thickness: 251. Progression has no prior data. Findings include abnormal foveal contour, cystoid macular edema.   Notes OD with diffuse atrophy but no active maculopathy  OS with minor perifoveal CME with normal foveal contour retained, this bare minimum of CSME with stable acuity does not require intervention at this time but careful follow-up     Color Fundus Photography Optos - OU - Both Eyes       Right Eye Disc findings include increased cup to disc ratio. Macula : microaneurysms.   Left Eye Progression has no prior data. Disc findings include increased cup to disc ratio. Macula : microaneurysms.   Notes Old focal lasers may be present inferotemporal to macula OD and superotemporal macula OS  Cluster of microaneurysms on the temporal aspect of the fovea OS  OD, with extensive region of retinal nonperfusion that is white lines temporally.  Reviewed with family.  This region will need peripheral PRP in the future             ASSESSMENT/PLAN:  Severe nonproliferative diabetic retinopathy of left eye, with macular edema, associated with type 2 diabetes mellitus (HCC) OS with minor CSME can be observed follow-up in 4 months  Severe nonproliferative diabetic retinopathy of right eye without macular edema associated with type 2 diabetes mellitus ([place]) OD with severe NPDR.  In large regional area of nonperfusion temporal window.  This area poses high risk for developing proliferative diabetic retinopathy upon next follow-up in 4  months will deliver PRP to this area.     ICD-10-CM   1. Severe nonproliferative diabetic retinopathy of right eye without macular edema associated with type 2 diabetes mellitus (HCC)  E11.3491 OCT, Retina - OU - Both Eyes    Color Fundus Photography Optos - OU - Both Eyes    2. Severe nonproliferative diabetic retinopathy of left eye, with macular edema, associated with type 2 diabetes mellitus (HCC)  F81.8299 OCT, Retina - OU - Both Eyes    Color Fundus Photography Optos - OU - Both Eyes      1.  OS with minor not significant CSME temporal aspect of fovea no impact on the foveal function we will continue to monitor and observe  2.  NPDR OU.  No high risk features left eye however the right eye has a large region of nonperfusion which will require peripheral PRP  3.  Ophthalmic Meds Ordered this visit:  No orders of the defined types were placed in this encounter.      Return in about 4 months (around 06/08/2022) for DILATE OU, OCT, PRP, OD.  There are no Patient Instructions on file for this visit.   Explained the diagnoses, plan, and follow up with the patient and they expressed understanding.  Patient expressed understanding of the importance of proper follow up care.   Cassandra Adams M.D. Diseases & Surgery of the Retina and Vitreous Retina & Diabetic [place] 02/06/22  Abbreviations: M myopia (nearsighted); A astigmatism; H hyperopia (farsighted); P presbyopia; Mrx spectacle prescription;  CTL contact lenses; OD right eye; OS left eye; OU both eyes  XT exotropia; ET esotropia; PEK punctate epithelial keratitis; PEE punctate epithelial erosions; DES dry eye syndrome; MGD meibomian gland dysfunction; ATs artificial tears; PFAT's preservative free artificial tears; [place] nuclear sclerotic cataract; PSC posterior subcapsular cataract; ERM epi-retinal membrane; PVD posterior vitreous detachment; RD retinal detachment; DM diabetes mellitus; DR diabetic retinopathy; NPDR  non-proliferative diabetic retinopathy; PDR proliferative diabetic retinopathy; CSME clinically significant macular edema; DME diabetic macular edema; dbh dot blot hemorrhages; CWS cotton wool spot; POAG primary open angle glaucoma; C/D cup-to-disc ratio; HVF humphrey visual field; GVF goldmann visual field; OCT optical coherence tomography; IOP intraocular pressure; BRVO Branch retinal vein occlusion; CRVO central retinal vein occlusion; CRAO central retinal artery occlusion; BRAO branch retinal artery occlusion; RT retinal tear; SB scleral buckle; PPV pars plana vitrectomy; VH Vitreous hemorrhage; PRP panretinal laser photocoagulation; IVK intravitreal kenalog; VMT vitreomacular traction; MH Macular hole;  NVD neovascularization of the disc; NVE neovascularization elsewhere; AREDS age related eye disease study; ARMD age related macular degeneration; POAG primary open angle glaucoma; EBMD epithelial/anterior basement membrane dystrophy; ACIOL anterior chamber intraocular lens; IOL intraocular lens; PCIOL posterior chamber intraocular lens; Phaco/IOL phacoemulsification with intraocular lens placement; [employer] photorefractive keratectomy; LASIK laser assisted in situ keratomileusis; HTN hypertension; DM diabetes mellitus; COPD chronic obstructive pulmonary disease

## 2022-02-06 NOTE — Assessment & Plan Note (Signed)
OS with minor CSME can be observed follow-up in 4 months

## 2022-02-06 NOTE — Assessment & Plan Note (Signed)
OD with severe NPDR.  In large regional area of nonperfusion temporal window.  This area poses high risk for developing proliferative diabetic retinopathy upon next follow-up in 4 months will deliver PRP to this area.

## 2022-02-07 DIAGNOSIS — E875 Hyperkalemia: Secondary | ICD-10-CM | POA: Diagnosis not present

## 2022-02-08 LAB — BASIC METABOLIC PANEL
BUN/Creatinine Ratio: 19 (ref 12–28)
BUN: 49 mg/dL — ABNORMAL HIGH (ref 8–27)
CO2: 24 mmol/L (ref 20–29)
Calcium: 10.1 mg/dL (ref 8.7–10.3)
Chloride: 102 mmol/L (ref 96–106)
Creatinine, Ser: 2.55 mg/dL — ABNORMAL HIGH (ref 0.57–1.00)
Glucose: 111 mg/dL — ABNORMAL HIGH (ref 70–99)
Potassium: 4.8 mmol/L (ref 3.5–5.2)
Sodium: 140 mmol/L (ref 134–144)
eGFR: 19 mL/min/{1.73_m2} — ABNORMAL LOW (ref >59)

## 2022-02-13 ENCOUNTER — Other Ambulatory Visit: Payer: Self-pay | Admitting: *Deleted

## 2022-02-13 DIAGNOSIS — N183 Chronic kidney disease, stage 3 unspecified: Secondary | ICD-10-CM

## 2022-02-20 DIAGNOSIS — N19 Unspecified kidney failure: Secondary | ICD-10-CM | POA: Diagnosis not present

## 2022-02-20 DIAGNOSIS — I509 Heart failure, unspecified: Secondary | ICD-10-CM | POA: Diagnosis not present

## 2022-03-06 DIAGNOSIS — N179 Acute kidney failure, unspecified: Secondary | ICD-10-CM | POA: Diagnosis not present

## 2022-03-06 DIAGNOSIS — I509 Heart failure, unspecified: Secondary | ICD-10-CM | POA: Diagnosis not present

## 2022-03-06 DIAGNOSIS — I2510 Atherosclerotic heart disease of native coronary artery without angina pectoris: Secondary | ICD-10-CM | POA: Diagnosis not present

## 2022-03-06 DIAGNOSIS — E119 Type 2 diabetes mellitus without complications: Secondary | ICD-10-CM | POA: Diagnosis not present

## 2022-03-06 DIAGNOSIS — I4891 Unspecified atrial fibrillation: Secondary | ICD-10-CM | POA: Diagnosis not present

## 2022-03-06 DIAGNOSIS — E875 Hyperkalemia: Secondary | ICD-10-CM | POA: Diagnosis not present

## 2022-03-06 DIAGNOSIS — N189 Chronic kidney disease, unspecified: Secondary | ICD-10-CM | POA: Diagnosis not present

## 2022-03-23 DIAGNOSIS — I509 Heart failure, unspecified: Secondary | ICD-10-CM | POA: Diagnosis not present

## 2022-03-24 DIAGNOSIS — L84 Corns and callosities: Secondary | ICD-10-CM | POA: Diagnosis not present

## 2022-03-24 DIAGNOSIS — I739 Peripheral vascular disease, unspecified: Secondary | ICD-10-CM | POA: Diagnosis not present

## 2022-03-24 DIAGNOSIS — E1051 Type 1 diabetes mellitus with diabetic peripheral angiopathy without gangrene: Secondary | ICD-10-CM | POA: Diagnosis not present

## 2022-03-24 DIAGNOSIS — L603 Nail dystrophy: Secondary | ICD-10-CM | POA: Diagnosis not present

## 2022-04-07 ENCOUNTER — Emergency Department (HOSPITAL_COMMUNITY): Payer: Medicare HMO

## 2022-04-07 ENCOUNTER — Other Ambulatory Visit: Payer: Self-pay

## 2022-04-07 ENCOUNTER — Encounter (HOSPITAL_COMMUNITY): Payer: Self-pay

## 2022-04-07 ENCOUNTER — Emergency Department (HOSPITAL_COMMUNITY)
Admission: EM | Admit: 2022-04-07 | Discharge: 2022-04-08 | Disposition: A | Discharge status: Home / Self Care | Payer: Medicare HMO | Attending: Emergency Medicine | Admitting: Emergency Medicine

## 2022-04-07 DIAGNOSIS — Z794 Long term (current) use of insulin: Secondary | ICD-10-CM | POA: Diagnosis not present

## 2022-04-07 DIAGNOSIS — I129 Hypertensive chronic kidney disease with stage 1 through stage 4 chronic kidney disease, or unspecified chronic kidney disease: Secondary | ICD-10-CM | POA: Insufficient documentation

## 2022-04-07 DIAGNOSIS — S199XXA Unspecified injury of neck, initial encounter: Secondary | ICD-10-CM | POA: Diagnosis not present

## 2022-04-07 DIAGNOSIS — E1165 Type 2 diabetes mellitus with hyperglycemia: Secondary | ICD-10-CM | POA: Insufficient documentation

## 2022-04-07 DIAGNOSIS — Z7901 Long term (current) use of anticoagulants: Secondary | ICD-10-CM | POA: Diagnosis not present

## 2022-04-07 DIAGNOSIS — S0990XA Unspecified injury of head, initial encounter: Secondary | ICD-10-CM | POA: Insufficient documentation

## 2022-04-07 DIAGNOSIS — Z7984 Long term (current) use of oral hypoglycemic drugs: Secondary | ICD-10-CM | POA: Insufficient documentation

## 2022-04-07 DIAGNOSIS — Z79899 Other long term (current) drug therapy: Secondary | ICD-10-CM | POA: Insufficient documentation

## 2022-04-07 DIAGNOSIS — I2510 Atherosclerotic heart disease of native coronary artery without angina pectoris: Secondary | ICD-10-CM | POA: Insufficient documentation

## 2022-04-07 DIAGNOSIS — M25552 Pain in left hip: Secondary | ICD-10-CM | POA: Diagnosis not present

## 2022-04-07 DIAGNOSIS — N183 Chronic kidney disease, stage 3 unspecified: Secondary | ICD-10-CM | POA: Insufficient documentation

## 2022-04-07 DIAGNOSIS — R739 Hyperglycemia, unspecified: Secondary | ICD-10-CM

## 2022-04-07 DIAGNOSIS — W01198A Fall on same level from slipping, tripping and stumbling with subsequent striking against other object, initial encounter: Secondary | ICD-10-CM | POA: Diagnosis not present

## 2022-04-07 DIAGNOSIS — M25462 Effusion, left knee: Secondary | ICD-10-CM | POA: Diagnosis not present

## 2022-04-07 DIAGNOSIS — M47812 Spondylosis without myelopathy or radiculopathy, cervical region: Secondary | ICD-10-CM | POA: Diagnosis not present

## 2022-04-07 DIAGNOSIS — M25562 Pain in left knee: Secondary | ICD-10-CM | POA: Diagnosis not present

## 2022-04-07 DIAGNOSIS — M4313 Spondylolisthesis, cervicothoracic region: Secondary | ICD-10-CM | POA: Diagnosis not present

## 2022-04-07 DIAGNOSIS — I6522 Occlusion and stenosis of left carotid artery: Secondary | ICD-10-CM | POA: Diagnosis not present

## 2022-04-07 LAB — I-STAT VENOUS BLOOD GAS, ED
Acid-base deficit: 2 mmol/L (ref 0.0–2.0)
Bicarbonate: 24.3 mmol/L (ref 20.0–28.0)
Calcium, Ion: 1.43 mmol/L — ABNORMAL HIGH (ref 1.15–1.40)
HCT: 37 % (ref 36.0–46.0)
Hemoglobin: 12.6 g/dL (ref 12.0–15.0)
O2 Saturation: 32 %
Potassium: 4.9 mmol/L (ref 3.5–5.1)
Sodium: 138 mmol/L (ref 135–145)
TCO2: 26 mmol/L (ref 22–32)
pCO2, Ven: 46 mmHg (ref 44–60)
pH, Ven: 7.33 (ref 7.25–7.43)
pO2, Ven: 21 mmHg — CL (ref 32–45)

## 2022-04-07 LAB — CBC WITH DIFFERENTIAL/PLATELET
Abs Immature Granulocytes: 0.06 10*3/uL (ref 0.00–0.07)
Basophils Absolute: 0 10*3/uL (ref 0.0–0.1)
Basophils Relative: 1 %
Eosinophils Absolute: 0.1 10*3/uL (ref 0.0–0.5)
Eosinophils Relative: 1 %
HCT: 36.4 % (ref 36.0–46.0)
Hemoglobin: 13.1 g/dL (ref 12.0–15.0)
Immature Granulocytes: 1 %
Lymphocytes Relative: 17 %
Lymphs Abs: 0.8 10*3/uL (ref 0.7–4.0)
MCH: 28.9 pg (ref 26.0–34.0)
MCHC: 36 g/dL (ref 30.0–36.0)
MCV: 80.4 fL (ref 80.0–100.0)
Monocytes Absolute: 0.4 10*3/uL (ref 0.1–1.0)
Monocytes Relative: 7 %
Neutro Abs: 3.7 10*3/uL (ref 1.7–7.7)
Neutrophils Relative %: 73 %
Platelets: 162 10*3/uL (ref 150–400)
RBC: 4.53 MIL/uL (ref 3.87–5.11)
RDW: 13.9 % (ref 11.5–15.5)
WBC: 5 10*3/uL (ref 4.0–10.5)
nRBC: 0 % (ref 0.0–0.2)

## 2022-04-07 LAB — URINALYSIS, ROUTINE W REFLEX MICROSCOPIC
Bilirubin Urine: NEGATIVE
Glucose, UA: >=500 mg/dL — AB
Ketones, ur: NEGATIVE mg/dL
Nitrite: NEGATIVE
Protein, ur: NEGATIVE mg/dL
Specific Gravity, Urine: 1.024 (ref 1.005–1.030)
pH: 5 (ref 5.0–8.0)

## 2022-04-07 LAB — COMPREHENSIVE METABOLIC PANEL
ALT: 24 U/L (ref 0–44)
AST: 27 U/L (ref 15–41)
Albumin: 3.7 g/dL (ref 3.5–5.0)
Alkaline Phosphatase: 77 U/L (ref 38–126)
Anion gap: 11 (ref 5–15)
BUN: 58 mg/dL — ABNORMAL HIGH (ref 8–23)
CO2: 24 mmol/L (ref 22–32)
Calcium: 11.2 mg/dL — ABNORMAL HIGH (ref 8.9–10.3)
Chloride: 103 mmol/L (ref 98–111)
Creatinine, Ser: 2.83 mg/dL — ABNORMAL HIGH (ref 0.44–1.00)
GFR, Estimated: 17 mL/min — ABNORMAL LOW (ref >60)
Glucose, Bld: 617 mg/dL (ref 70–99)
Potassium: 4.9 mmol/L (ref 3.5–5.1)
Sodium: 138 mmol/L (ref 135–145)
Total Bilirubin: 0.5 mg/dL (ref 0.3–1.2)
Total Protein: 6.8 g/dL (ref 6.5–8.1)

## 2022-04-07 LAB — BETA-HYDROXYBUTYRIC ACID: Beta-Hydroxybutyric Acid: 0.09 mmol/L (ref 0.05–0.27)

## 2022-04-07 LAB — CBG MONITORING, ED
Glucose-Capillary: 599 mg/dL (ref 70–99)
Glucose-Capillary: 598 mg/dL (ref 70–99)
Glucose-Capillary: 584 mg/dL (ref 70–99)

## 2022-04-07 LAB — LIPASE, BLOOD: Lipase: 27 U/L (ref 11–51)

## 2022-04-07 MED ORDER — INSULIN ASPART 100 UNIT/ML IJ SOLN: 10.0000 [IU] | Freq: Once | INTRAMUSCULAR | Status: DC

## 2022-04-07 MED ORDER — SODIUM CHLORIDE 0.9 % IV BOLUS
500.0000 mL | Freq: Once | INTRAVENOUS | Status: AC
  Administered 2022-04-07: 500 mL via INTRAVENOUS

## 2022-04-07 MED ORDER — INSULIN ASPART 100 UNIT/ML IJ SOLN
10.0000 [IU] | Freq: Once | INTRAMUSCULAR | Status: AC
  Administered 2022-04-07: 10 [IU] via INTRAVENOUS

## 2022-04-07 MED ORDER — INSULIN ASPART 100 UNIT/ML IJ SOLN
9.0000 [IU] | Freq: Once | INTRAMUSCULAR | Status: AC
  Administered 2022-04-07: 9 [IU] via SUBCUTANEOUS

## 2022-04-07 MED ORDER — ONDANSETRON 4 MG PO TBDP
4.0000 mg | ORAL_TABLET | Freq: Once | ORAL | Status: AC
  Administered 2022-04-07: 4 mg via ORAL
  Filled 2022-04-07: qty 1

## 2022-04-07 NOTE — ED Triage Notes (Signed)
Arrives via family member from home.   Sts cbg has been >600 for around 3 days due to supply items. Last checked and adminitered insulin this morning.   Also admits falling and having left hip pain. Denies hitting head or neck tenderness. Compliant on plavix.

## 2022-04-07 NOTE — ED Notes (Signed)
Patient transported to X-ray & CT °

## 2022-04-07 NOTE — ED Provider Notes (Signed)
[facility] EMERGENCY DEPARTMENT Provider Note   CSN: [id number] Arrival date & time: 04/07/22  1857     History  Chief Complaint  Patient presents with   Hyperglycemia   Fall    Cassandra Adams is a 76 y.o. female.  With past medical history of TIA, CAD, CKD stage III, hypertension, type 2 diabetes who presents to the emergency department with fall and hyperglycemia.  States that earlier this evening she was at home when she fell.  States she just lost her footing, falling backward onto her bottom on carpet.  She denies striking her head or loss of consciousness.  She denies chest pain, shortness of breath, dizziness or lightheadedness prior to or after the fall.  She is on Plavix for paroxysmal atrial fibrillation. States her left hip and knee are hurting since falling. States she keeps her phone on her so was able to call daughter to came to get her. Did not have prolonged down time.   Additionally she states that her blood sugars have been 600 for about 3 days now.  States that she has been taking her NovoLog with meals and her Lantus at night.  States she has had associated nausea without vomiting, polyuria, polydipsia. States sugars normally run ~200. Denies recent illnesses.    Hyperglycemia Associated symptoms: increased thirst, nausea and polyuria   Associated symptoms: no abdominal pain and no vomiting   Fall Pertinent negatives include no abdominal pain.       Home Medications Prior to Admission medications   Medication Sig Start Date End Date Taking? Authorizing Provider  amiodarone (PACERONE) 200 MG tablet Take 1 tablet (200 mg total) by mouth daily. Start taking from 12/23/21 12/23/21   Oswald Hillock, MD  atorvastatin (LIPITOR) 80 MG tablet Take 1 tablet (80 mg total) by mouth every evening. 12/21/20   Hilty, Nadean Corwin, MD  B-D ULTRAFINE III SHORT PEN 31G X 8 MM MISC SMARTSIG:Injection Daily 07/06/21   [provider]  BD INSULIN  SYRINGE U/F 31G X 5/16" 0.5 ML MISC Use to take with Novolog [facility] 01/07/21   Cassandria Anger, MD  clopidogrel (PLAVIX) 75 MG tablet Take 1 tablet (75 mg total) by mouth daily. 09/17/21   Orson Eva, MD  colesevelam [facility]) 625 MG tablet Take 1,875 mg by mouth 2 (two) times daily with a meal. Noon and night    [provider]  Continuous Blood Gluc Receiver (FREESTYLE LIBRE 2 READER) DEVI 1 each by Does not apply route daily in the afternoon. 06/15/21   Amin, Jeanella Flattery, MD  Continuous Blood Gluc Sensor (FREESTYLE LIBRE 2 SENSOR) MISC 1 each by Does not apply route daily in the afternoon. 06/15/21   Amin, Ankit Chirag, MD  ELIQUIS 5 MG TABS tablet Take 1 tablet (5 mg total) by mouth 2 (two) times daily. 11/07/20   Johnson, Clanford L, MD  empagliflozin (JARDIANCE) 10 MG TABS tablet Take 5 mg by mouth daily. Take 0.5 Half Tablet Daily    [provider]  furosemide (LASIX) 40 MG tablet Take 40 mg by mouth 2 (two) times daily. 12/21/21   [provider]  Insulin Glargine (BASAGLAR KWIKPEN) 100 UNIT/ML Inject 10 Units into the skin at bedtime. Patient taking differently: Inject 25 Units into the skin at bedtime. 08/07/21   Dahal, Marlowe Aschoff, MD  nitroGLYCERIN (NITROSTAT) 0.4 MG SL tablet PLACE 1 TABLET UNDER THE TONGUE EVERY 5 MINUTES AS NEEDED FOR CHEST PAIN (UP TO 3 DOSES) 01/02/22  Hilty, Nadean Corwin, MD  NOVOLOG 100 UNIT/ML injection Inject 6-9 Units into the skin 3 (three) times daily with meals. Sliding Scale 07/28/20   [provider]  [facility] Suite ULTRA test strip TESTING 3 TIMES DAILY 06/20/21   [provider]  polyethylene glycol (MIRALAX / GLYCOLAX) 17 g packet Take 17 g by mouth daily. Patient taking differently: Take 17 g by mouth daily as needed. 11/12/20   Dessa Phi, DO  sacubitril-valsartan (ENTRESTO) 97-103 MG Take 0.5 tablets by mouth 2 (two) times daily. Start 12/28/21-STOP lasix when started 12/21/21   Ledora Bottcher, PA  Vitamin D,  Cholecalciferol, 25 MCG (1000 UT) TABS Take 2,000 Units by mouth daily.    [provider]      Allergies    Penicillins    Review of Systems   Review of Systems  Gastrointestinal:  Positive for nausea. Negative for abdominal pain and vomiting.  Endocrine: Positive for polydipsia and polyuria.  Musculoskeletal:  Positive for arthralgias.  All other systems reviewed and are negative.   Physical Exam Updated Vital Signs BP (!) 112/52   Pulse 66   Temp 98.2 F (36.8 C)   Resp 16   Ht '5\' 6"'$  (1.676 m)   Wt 72.6 kg   SpO2 99%   BMI 25.82 kg/m  Physical Exam Vitals and nursing note reviewed.  Constitutional:      General: She is not in acute distress.    Appearance: Normal appearance. She is not ill-appearing or toxic-appearing.  HENT:     Head: Normocephalic and atraumatic.     Nose: Nose normal.     Mouth/Throat:     Mouth: Mucous membranes are moist.     Pharynx: Oropharynx is clear.  Eyes:     General: No scleral icterus.    Extraocular Movements: Extraocular movements intact.     Pupils: Pupils are equal, round, and reactive to light.  Cardiovascular:     Rate and Rhythm: Normal rate and regular rhythm.     Pulses: Normal pulses.     Heart sounds: No murmur heard. Pulmonary:     Effort: Pulmonary effort is normal. No respiratory distress.     Breath sounds: Normal breath sounds.  Abdominal:     General: Bowel sounds are normal. There is no distension.     Palpations: Abdomen is soft.     Tenderness: There is no abdominal tenderness.  Musculoskeletal:        General: Tenderness present. No swelling or deformity.     Cervical back: Neck supple.     Left hip: Tenderness and bony tenderness present. No deformity. Normal range of motion. Normal strength.     Left knee: Bony tenderness present. No swelling or deformity. Normal range of motion.  Skin:    General: Skin is warm and dry.     Capillary Refill: Capillary refill takes less than 2 seconds.      Findings: No bruising.  Neurological:     General: No focal deficit present.     Mental Status: She is alert and oriented to person, place, and time. Mental status is at baseline.  Psychiatric:        Mood and Affect: Mood normal.        Behavior: Behavior normal.        Thought Content: Thought content normal.        Judgment: Judgment normal.    ED Results / Procedures / Treatments   Labs (all labs ordered are listed,  but only abnormal results are displayed) Labs Reviewed  COMPREHENSIVE METABOLIC PANEL - Abnormal; Notable for the following components:      Result Value   Glucose, Bld 617 (*)    BUN 58 (*)    Creatinine, Ser 2.83 (*)    Calcium 11.2 (*)    GFR, Estimated 17 (*)    All other components within normal limits  URINALYSIS, ROUTINE W REFLEX MICROSCOPIC - Abnormal; Notable for the following components:   APPearance HAZY (*)    Glucose, UA >=500 (*)    Hgb urine dipstick SMALL (*)    Leukocytes,Ua TRACE (*)    Bacteria, UA RARE (*)    All other components within normal limits  CBG MONITORING, ED - Abnormal; Notable for the following components:   Glucose-Capillary 599 (*)    All other components within normal limits  CBG MONITORING, ED - Abnormal; Notable for the following components:   Glucose-Capillary 598 (*)    All other components within normal limits  I-STAT VENOUS BLOOD GAS, ED - Abnormal; Notable for the following components:   pO2, Ven 21 (*)    Calcium, Ion 1.43 (*)    All other components within normal limits  LIPASE, BLOOD  CBC WITH DIFFERENTIAL/PLATELET  BETA-HYDROXYBUTYRIC ACID  CBG MONITORING, ED   EKG None  Radiology CT Head Wo Contrast  Result Date: 04/07/2022 CLINICAL DATA:  Head and neck trauma. EXAM: CT HEAD WITHOUT CONTRAST CT CERVICAL SPINE WITHOUT CONTRAST TECHNIQUE: Multidetector CT imaging of the head and cervical spine was performed following the standard protocol without intravenous contrast. Multiplanar CT image reconstructions  of the cervical spine were also generated. RADIATION DOSE REDUCTION: This exam was performed according to the departmental dose-optimization program which includes automated exposure control, adjustment of the mA and/or kV according to patient size and/or use of iterative reconstruction technique. COMPARISON:  11/05/2020. FINDINGS: CT HEAD FINDINGS Brain: No acute intracranial hemorrhage, midline shift or mass effect. No extra-axial fluid collection. Mild periventricular white matter hypodensities are present bilaterally. No hydrocephalus. Vascular: No hyperdense vessel or unexpected calcification. Skull: Normal. Negative for fracture or focal lesion. Sinuses/Orbits: Mucosal thickening is present in the right maxillary sinus. The remaining paranasal sinuses are clear. No acute orbital abnormality. Other: None. CT CERVICAL SPINE FINDINGS Alignment: Mild anterolisthesis at C7-T1. Skull base and vertebrae: No acute fracture. There is fusion of the C5-C6 vertebral bodies Soft tissues and spinal canal: No prevertebral fluid or swelling. No visible canal hematoma. Disc levels: Intervertebral degenerative endplate changes and facet arthropathy, most pronounced at C6-C7. Upper chest: Negative. Other: Carotid artery calcifications on the left. IMPRESSION: 1. No acute intracranial process. 2. Degenerative changes in the cervical spine without evidence of acute fracture. Electronically Signed   By: Brett Fairy M.D.   On: 04/07/2022 20:51   CT Cervical Spine Wo Contrast  Result Date: 04/07/2022 CLINICAL DATA:  Head and neck trauma. EXAM: CT HEAD WITHOUT CONTRAST CT CERVICAL SPINE WITHOUT CONTRAST TECHNIQUE: Multidetector CT imaging of the head and cervical spine was performed following the standard protocol without intravenous contrast. Multiplanar CT image reconstructions of the cervical spine were also generated. RADIATION DOSE REDUCTION: This exam was performed according to the departmental dose-optimization program  which includes automated exposure control, adjustment of the mA and/or kV according to patient size and/or use of iterative reconstruction technique. COMPARISON:  11/05/2020. FINDINGS: CT HEAD FINDINGS Brain: No acute intracranial hemorrhage, midline shift or mass effect. No extra-axial fluid collection. Mild periventricular white matter hypodensities are present bilaterally.  No hydrocephalus. Vascular: No hyperdense vessel or unexpected calcification. Skull: Normal. Negative for fracture or focal lesion. Sinuses/Orbits: Mucosal thickening is present in the right maxillary sinus. The remaining paranasal sinuses are clear. No acute orbital abnormality. Other: None. CT CERVICAL SPINE FINDINGS Alignment: Mild anterolisthesis at C7-T1. Skull base and vertebrae: No acute fracture. There is fusion of the C5-C6 vertebral bodies Soft tissues and spinal canal: No prevertebral fluid or swelling. No visible canal hematoma. Disc levels: Intervertebral degenerative endplate changes and facet arthropathy, most pronounced at C6-C7. Upper chest: Negative. Other: Carotid artery calcifications on the left. IMPRESSION: 1. No acute intracranial process. 2. Degenerative changes in the cervical spine without evidence of acute fracture. Electronically Signed   By: Brett Fairy M.D.   On: 04/07/2022 20:51   DG Knee Complete 4 Views Left  Result Date: 04/07/2022 CLINICAL DATA:  Left hip and knee pain after fall today. EXAM: LEFT KNEE - COMPLETE 4+ VIEW COMPARISON:  None Available. FINDINGS: No fracture or dislocation. Moderate tricompartmental osteoarthritis, tricompartmental peripheral spurring, diffuse joint space narrowing. Fragmented osteophyte at the lateral joint line. Minimal knee joint effusion. Small quadriceps tendon enthesophyte. IMPRESSION: 1. No fracture or dislocation of the left knee. 2. Moderate tricompartmental osteoarthritis. Electronically Signed   By: Keith Rake M.D.   On: 04/07/2022 20:21   DG Hip Unilat  With Pelvis 2-3 Views Left  Result Date: 04/07/2022 CLINICAL DATA:  Left hip and knee pain after fall today. EXAM: DG HIP (WITH OR WITHOUT PELVIS) 2-3V LEFT COMPARISON:  None Available. FINDINGS: No fracture of the pelvis or left hip. The femoral head is seated. Minimal degenerative acetabular spurring. Intact pubic rami. Cubic symphysis and sacroiliac joints are congruent. Popcorn calcifications in the mid pelvis typical of fibroids. IMPRESSION: No fracture of the pelvis or left hip. Electronically Signed   By: Keith Rake M.D.   On: 04/07/2022 20:20    Procedures Procedures   Medications Ordered in ED Medications  sodium chloride 0.9 % bolus 500 mL (has no administration in time range)  insulin aspart (novoLOG) injection 9 Units (has no administration in time range)  sodium chloride 0.9 % bolus 500 mL (500 mLs Intravenous New Bag/Given 04/07/22 2023)  ondansetron (ZOFRAN-ODT) disintegrating tablet 4 mg (4 mg Oral Given 04/07/22 2040)    ED Course/ Medical Decision Making/ A&P Clinical Course as of 04/07/22 2202  Fri Apr 07, 2022  2201 Pt states that 400-500BG would be 9 units Novolog on her sliding scale. Will given 9 units SQ and 57m saline and recheck [LA]    Clinical Course User Index [LA] AMickie Hillier PA-C                           Medical Decision Making Amount and/or Complexity of Data Reviewed Labs: ordered. Radiology: ordered.  Risk Prescription drug management.  Care of patient handed off to Dr. PBetsey Holidayat shift change. Briefly, 76yoF with fall and hyperglycemia. Imaging is negative for fractures or intracranial bleed. Do feel based on history and exam that this was a mechanical fall on thinners.  Her BG was 600. Family arrived and stated she has been out of her Lantus for 3 days. She just received prescription this evening. Likely reason for hyperglycemia. Does not appear to be in DKA or HHS. No vomiting or abdominal pain and labs are stable. Given total of  1LIVF so far and 20 units of novolog with little movement in her BG.  Patient  will need to continue have insulin until blood sugar is less than 300 and reasonable for her to be discharged home.  Please see Dr. Betsey Holiday note for completion of care. Final Clinical Impression(s) / ED Diagnoses Final diagnoses:  None    Rx / DC Orders ED Discharge Orders     None         Mickie Hillier, PA-C 04/07/22 2351    Orpah Greek, MD 04/08/22 [phone]

## 2022-04-07 NOTE — ED Notes (Signed)
Critical value given to Mayra Neer, MD

## 2022-04-08 LAB — CBG MONITORING, ED
Glucose-Capillary: 302 mg/dL — ABNORMAL HIGH (ref 70–99)
Glucose-Capillary: 461 mg/dL — ABNORMAL HIGH (ref 70–99)

## 2022-04-08 MED ORDER — INSULIN ASPART 100 UNIT/ML IJ SOLN
8.0000 [IU] | Freq: Once | INTRAMUSCULAR | Status: AC
  Administered 2022-04-08: 8 [IU] via SUBCUTANEOUS

## 2022-04-08 MED ORDER — INSULIN ASPART 100 UNIT/ML IJ SOLN
10.0000 [IU] | Freq: Once | INTRAMUSCULAR | Status: AC
  Administered 2022-04-08: 10 [IU] via INTRAVENOUS

## 2022-04-11 ENCOUNTER — Inpatient Hospital Stay (HOSPITAL_COMMUNITY)
Admission: EM | Admit: 2022-04-11 | Discharge: 2022-04-17 | DRG: 638 | Disposition: A | Discharge status: Home Health | Payer: Medicare HMO | Attending: Internal Medicine | Admitting: Internal Medicine

## 2022-04-11 ENCOUNTER — Other Ambulatory Visit: Payer: Self-pay

## 2022-04-11 ENCOUNTER — Encounter (HOSPITAL_COMMUNITY): Payer: Self-pay

## 2022-04-11 ENCOUNTER — Emergency Department (HOSPITAL_COMMUNITY): Payer: Medicare HMO

## 2022-04-11 DIAGNOSIS — Z6825 Body mass index (BMI) 25.0-25.9, adult: Secondary | ICD-10-CM | POA: Diagnosis not present

## 2022-04-11 DIAGNOSIS — Z880 Allergy status to penicillin: Secondary | ICD-10-CM

## 2022-04-11 DIAGNOSIS — R109 Unspecified abdominal pain: Secondary | ICD-10-CM | POA: Diagnosis not present

## 2022-04-11 DIAGNOSIS — I5032 Chronic diastolic (congestive) heart failure: Secondary | ICD-10-CM | POA: Diagnosis not present

## 2022-04-11 DIAGNOSIS — I252 Old myocardial infarction: Secondary | ICD-10-CM

## 2022-04-11 DIAGNOSIS — R112 Nausea with vomiting, unspecified: Secondary | ICD-10-CM | POA: Diagnosis not present

## 2022-04-11 DIAGNOSIS — R739 Hyperglycemia, unspecified: Secondary | ICD-10-CM | POA: Diagnosis not present

## 2022-04-11 DIAGNOSIS — Z1152 Encounter for screening for COVID-19: Secondary | ICD-10-CM

## 2022-04-11 DIAGNOSIS — Z7901 Long term (current) use of anticoagulants: Secondary | ICD-10-CM

## 2022-04-11 DIAGNOSIS — Z1331 Encounter for screening for depression: Secondary | ICD-10-CM | POA: Diagnosis not present

## 2022-04-11 DIAGNOSIS — I480 Paroxysmal atrial fibrillation: Secondary | ICD-10-CM | POA: Diagnosis present

## 2022-04-11 DIAGNOSIS — I482 Chronic atrial fibrillation, unspecified: Secondary | ICD-10-CM | POA: Diagnosis present

## 2022-04-11 DIAGNOSIS — E1122 Type 2 diabetes mellitus with diabetic chronic kidney disease: Secondary | ICD-10-CM | POA: Diagnosis present

## 2022-04-11 DIAGNOSIS — E1143 Type 2 diabetes mellitus with diabetic autonomic (poly)neuropathy: Secondary | ICD-10-CM | POA: Diagnosis present

## 2022-04-11 DIAGNOSIS — N179 Acute kidney failure, unspecified: Secondary | ICD-10-CM | POA: Diagnosis not present

## 2022-04-11 DIAGNOSIS — E119 Type 2 diabetes mellitus without complications: Secondary | ICD-10-CM | POA: Diagnosis not present

## 2022-04-11 DIAGNOSIS — Z7984 Long term (current) use of oral hypoglycemic drugs: Secondary | ICD-10-CM

## 2022-04-11 DIAGNOSIS — I2510 Atherosclerotic heart disease of native coronary artery without angina pectoris: Secondary | ICD-10-CM | POA: Diagnosis present

## 2022-04-11 DIAGNOSIS — K3184 Gastroparesis: Secondary | ICD-10-CM | POA: Diagnosis present

## 2022-04-11 DIAGNOSIS — Z0001 Encounter for general adult medical examination with abnormal findings: Secondary | ICD-10-CM | POA: Diagnosis not present

## 2022-04-11 DIAGNOSIS — M159 Polyosteoarthritis, unspecified: Secondary | ICD-10-CM | POA: Diagnosis present

## 2022-04-11 DIAGNOSIS — D573 Sickle-cell trait: Secondary | ICD-10-CM | POA: Diagnosis present

## 2022-04-11 DIAGNOSIS — N1832 Chronic kidney disease, stage 3b: Secondary | ICD-10-CM | POA: Diagnosis not present

## 2022-04-11 DIAGNOSIS — Z794 Long term (current) use of insulin: Secondary | ICD-10-CM | POA: Diagnosis not present

## 2022-04-11 DIAGNOSIS — I130 Hypertensive heart and chronic kidney disease with heart failure and stage 1 through stage 4 chronic kidney disease, or unspecified chronic kidney disease: Secondary | ICD-10-CM | POA: Diagnosis present

## 2022-04-11 DIAGNOSIS — Z79899 Other long term (current) drug therapy: Secondary | ICD-10-CM

## 2022-04-11 DIAGNOSIS — E1165 Type 2 diabetes mellitus with hyperglycemia: Secondary | ICD-10-CM | POA: Diagnosis not present

## 2022-04-11 DIAGNOSIS — Z8249 Family history of ischemic heart disease and other diseases of the circulatory system: Secondary | ICD-10-CM

## 2022-04-11 DIAGNOSIS — I10 Essential (primary) hypertension: Secondary | ICD-10-CM | POA: Diagnosis not present

## 2022-04-11 DIAGNOSIS — R1030 Lower abdominal pain, unspecified: Secondary | ICD-10-CM | POA: Diagnosis not present

## 2022-04-11 DIAGNOSIS — I4891 Unspecified atrial fibrillation: Secondary | ICD-10-CM | POA: Diagnosis not present

## 2022-04-11 DIAGNOSIS — I700 Atherosclerosis of aorta: Secondary | ICD-10-CM | POA: Diagnosis not present

## 2022-04-11 DIAGNOSIS — E663 Overweight: Secondary | ICD-10-CM | POA: Diagnosis not present

## 2022-04-11 DIAGNOSIS — Z801 Family history of malignant neoplasm of trachea, bronchus and lung: Secondary | ICD-10-CM

## 2022-04-11 DIAGNOSIS — N184 Chronic kidney disease, stage 4 (severe): Secondary | ICD-10-CM | POA: Diagnosis present

## 2022-04-11 DIAGNOSIS — E860 Dehydration: Secondary | ICD-10-CM | POA: Diagnosis present

## 2022-04-11 DIAGNOSIS — Z83438 Family history of other disorder of lipoprotein metabolism and other lipidemia: Secondary | ICD-10-CM

## 2022-04-11 DIAGNOSIS — Z7902 Long term (current) use of antithrombotics/antiplatelets: Secondary | ICD-10-CM

## 2022-04-11 DIAGNOSIS — L89152 Pressure ulcer of sacral region, stage 2: Secondary | ICD-10-CM | POA: Diagnosis present

## 2022-04-11 DIAGNOSIS — N183 Chronic kidney disease, stage 3 unspecified: Secondary | ICD-10-CM | POA: Diagnosis not present

## 2022-04-11 DIAGNOSIS — Z833 Family history of diabetes mellitus: Secondary | ICD-10-CM

## 2022-04-11 DIAGNOSIS — Z8673 Personal history of transient ischemic attack (TIA), and cerebral infarction without residual deficits: Secondary | ICD-10-CM

## 2022-04-11 DIAGNOSIS — L8990 Pressure ulcer of unspecified site, unspecified stage: Secondary | ICD-10-CM | POA: Insufficient documentation

## 2022-04-11 LAB — CBC
HCT: 37.9 % (ref 36.0–46.0)
Hemoglobin: 13.2 g/dL (ref 12.0–15.0)
MCH: 28.5 pg (ref 26.0–34.0)
MCHC: 34.8 g/dL (ref 30.0–36.0)
MCV: 81.9 fL (ref 80.0–100.0)
Platelets: 141 10*3/uL — ABNORMAL LOW (ref 150–400)
RBC: 4.63 MIL/uL (ref 3.87–5.11)
RDW: 14.5 % (ref 11.5–15.5)
WBC: 4.6 10*3/uL (ref 4.0–10.5)
nRBC: 0 % (ref 0.0–0.2)

## 2022-04-11 LAB — CBG MONITORING, ED
Glucose-Capillary: >600 mg/dL (ref 70–99)
Glucose-Capillary: >600 mg/dL (ref 70–99)
Glucose-Capillary: 493 mg/dL — ABNORMAL HIGH (ref 70–99)
Glucose-Capillary: 413 mg/dL — ABNORMAL HIGH (ref 70–99)
Glucose-Capillary: 390 mg/dL — ABNORMAL HIGH (ref 70–99)
Glucose-Capillary: 370 mg/dL — ABNORMAL HIGH (ref 70–99)

## 2022-04-11 LAB — URINALYSIS, ROUTINE W REFLEX MICROSCOPIC
Bilirubin Urine: NEGATIVE
Glucose, UA: >=500 mg/dL — AB
Hgb urine dipstick: NEGATIVE
Ketones, ur: 5 mg/dL — AB
Leukocytes,Ua: NEGATIVE
Nitrite: NEGATIVE
Protein, ur: NEGATIVE mg/dL
Specific Gravity, Urine: 1.024 (ref 1.005–1.030)
pH: 5 (ref 5.0–8.0)

## 2022-04-11 LAB — I-STAT VENOUS BLOOD GAS, ED
Acid-base deficit: 4 mmol/L — ABNORMAL HIGH (ref 0.0–2.0)
Bicarbonate: 21.1 mmol/L (ref 20.0–28.0)
Calcium, Ion: 1.34 mmol/L (ref 1.15–1.40)
HCT: 35 % — ABNORMAL LOW (ref 36.0–46.0)
Hemoglobin: 11.9 g/dL — ABNORMAL LOW (ref 12.0–15.0)
O2 Saturation: 78 %
Potassium: 5 mmol/L (ref 3.5–5.1)
Sodium: 135 mmol/L (ref 135–145)
TCO2: 22 mmol/L (ref 22–32)
pCO2, Ven: 36.9 mmHg — ABNORMAL LOW (ref 44–60)
pH, Ven: 7.366 (ref 7.25–7.43)
pO2, Ven: 43 mmHg (ref 32–45)

## 2022-04-11 LAB — COMPREHENSIVE METABOLIC PANEL
ALT: 20 U/L (ref 0–44)
AST: 20 U/L (ref 15–41)
Albumin: 3.4 g/dL — ABNORMAL LOW (ref 3.5–5.0)
Alkaline Phosphatase: 60 U/L (ref 38–126)
Anion gap: 14 (ref 5–15)
BUN: 63 mg/dL — ABNORMAL HIGH (ref 8–23)
CO2: 21 mmol/L — ABNORMAL LOW (ref 22–32)
Calcium: 10.7 mg/dL — ABNORMAL HIGH (ref 8.9–10.3)
Chloride: 106 mmol/L (ref 98–111)
Creatinine, Ser: 2.85 mg/dL — ABNORMAL HIGH (ref 0.44–1.00)
GFR, Estimated: 17 mL/min — ABNORMAL LOW (ref >60)
Glucose, Bld: 617 mg/dL (ref 70–99)
Potassium: 5 mmol/L (ref 3.5–5.1)
Sodium: 141 mmol/L (ref 135–145)
Total Bilirubin: 0.5 mg/dL (ref 0.3–1.2)
Total Protein: 6.7 g/dL (ref 6.5–8.1)

## 2022-04-11 LAB — GROUP A STREP BY PCR: Group A Strep by PCR: NOT DETECTED

## 2022-04-11 LAB — LIPASE, BLOOD: Lipase: 22 U/L (ref 11–51)

## 2022-04-11 LAB — RESP PANEL BY RT-PCR (FLU A&B, COVID) ARPGX2
Influenza A by PCR: NEGATIVE
Influenza B by PCR: NEGATIVE
SARS Coronavirus 2 by RT PCR: NEGATIVE

## 2022-04-11 LAB — BETA-HYDROXYBUTYRIC ACID: Beta-Hydroxybutyric Acid: 1.18 mmol/L — ABNORMAL HIGH (ref 0.05–0.27)

## 2022-04-11 MED ORDER — AMIODARONE HCL 200 MG PO TABS
200.0000 mg | ORAL_TABLET | Freq: Every day | ORAL | Status: DC
  Administered 2022-04-12 – 2022-04-17 (×6): 200 mg via ORAL
  Filled 2022-04-11 (×6): qty 1

## 2022-04-11 MED ORDER — HEPARIN SODIUM (PORCINE) 5000 UNIT/ML IJ SOLN: 5000.0000 [IU] | Freq: Three times a day (TID) | INTRAMUSCULAR | Status: DC

## 2022-04-11 MED ORDER — INSULIN ASPART 100 UNIT/ML IJ SOLN
7.0000 [IU] | Freq: Once | INTRAMUSCULAR | Status: AC
  Administered 2022-04-11: 7 [IU] via INTRAVENOUS

## 2022-04-11 MED ORDER — ONDANSETRON 4 MG PO TBDP
4.0000 mg | ORAL_TABLET | Freq: Once | ORAL | Status: AC
  Administered 2022-04-11: 4 mg via ORAL
  Filled 2022-04-11: qty 1

## 2022-04-11 MED ORDER — LACTATED RINGERS IV SOLN: INTRAVENOUS | Status: DC

## 2022-04-11 MED ORDER — DEXTROSE 50 % IV SOLN: 0.0000 mL | INTRAVENOUS | Status: DC | PRN

## 2022-04-11 MED ORDER — INSULIN REGULAR(HUMAN) IN NACL 100-0.9 UT/100ML-% IV SOLN
INTRAVENOUS | Status: DC
  Administered 2022-04-11: 9 [IU]/h via INTRAVENOUS
  Filled 2022-04-11: qty 100

## 2022-04-11 MED ORDER — MORPHINE SULFATE (PF) 4 MG/ML IV SOLN
4.0000 mg | Freq: Once | INTRAVENOUS | Status: AC
  Administered 2022-04-11: 4 mg via INTRAVENOUS
  Filled 2022-04-11: qty 1

## 2022-04-11 MED ORDER — CLOPIDOGREL BISULFATE 75 MG PO TABS
75.0000 mg | ORAL_TABLET | Freq: Every day | ORAL | Status: DC
  Administered 2022-04-12 – 2022-04-17 (×6): 75 mg via ORAL
  Filled 2022-04-11 (×6): qty 1

## 2022-04-11 MED ORDER — APIXABAN 5 MG PO TABS
5.0000 mg | ORAL_TABLET | Freq: Two times a day (BID) | ORAL | Status: DC
  Administered 2022-04-11 – 2022-04-17 (×12): 5 mg via ORAL
  Filled 2022-04-11 (×12): qty 1

## 2022-04-11 MED ORDER — ATORVASTATIN CALCIUM 80 MG PO TABS
80.0000 mg | ORAL_TABLET | Freq: Every evening | ORAL | Status: DC
  Administered 2022-04-11 – 2022-04-17 (×7): 80 mg via ORAL
  Filled 2022-04-11 (×4): qty 1
  Filled 2022-04-11: qty 2
  Filled 2022-04-11: qty 1
  Filled 2022-04-11: qty 2

## 2022-04-11 MED ORDER — INSULIN ASPART 100 UNIT/ML IJ SOLN
5.0000 [IU] | Freq: Once | INTRAMUSCULAR | Status: AC
  Administered 2022-04-11: 5 [IU] via INTRAVENOUS

## 2022-04-11 MED ORDER — ONDANSETRON HCL 4 MG/2ML IJ SOLN
4.0000 mg | Freq: Once | INTRAMUSCULAR | Status: AC
  Administered 2022-04-11: 4 mg via INTRAVENOUS
  Filled 2022-04-11: qty 2

## 2022-04-11 MED ORDER — DEXTROSE IN LACTATED RINGERS 5 % IV SOLN: INTRAVENOUS | Status: DC

## 2022-04-11 MED ORDER — LACTATED RINGERS IV BOLUS
1000.0000 mL | Freq: Once | INTRAVENOUS | Status: AC
  Administered 2022-04-11: 1000 mL via INTRAVENOUS

## 2022-04-11 NOTE — ED Provider Triage Note (Signed)
Emergency Medicine Provider Triage Evaluation Note  Cassandra Adams , a 76 y.o. female  was evaluated in triage.  Pt complains of abdominal pain, sore throat, and significant vomiting. Was seen here several days ago for same, blood sugar was corrected and she was discharged home, no DKA or HHS. States symptoms have persisted, even after restarting insulin regimen. Sugar at home continues reading in the 400-500's. Reports non bloody emesis.   Review of Systems  Positive: Abd pain, vomiting Negative: Diarrhea, fever  Physical Exam  BP (!) 140/69   Pulse 78   Temp 99.1 F (37.3 C)   Resp 14   SpO2 99%  Gen:   Awake, no distress   Resp:  Normal effort  MSK:   Moves extremities without difficulty  Other:    Medical Decision Making  Medically screening exam initiated at 12:34 PM.  Appropriate orders placed.  Cassandra Adams was informed that the remainder of the evaluation will be completed by another provider, this initial triage assessment does not replace that evaluation, and the importance of remaining in the ED until their evaluation is complete.  Workup initiated.    Cassandra Plummer, PA-C 04/11/22 1236

## 2022-04-11 NOTE — Assessment & Plan Note (Signed)
New onset diabetic gastroparesis perhaps? Vs gastroenteritis? 1. CT AP is neg for acute findings 2. Zofran PRN nausea 3. NPO for tonight 4. Try to advance diet after we get DM under control

## 2022-04-11 NOTE — ED Notes (Signed)
X2 unsuccessful IV sticks by this RN

## 2022-04-11 NOTE — Assessment & Plan Note (Signed)
See AKI above ?

## 2022-04-11 NOTE — ED Notes (Signed)
Date and time results received: 04/11/22 1:38 PM (use smartphrase ".now" to insert current time)  Test: CMP - Glucose Critical Value: 617   Name of Provider Notified: Lorin R. PA   Orders Received? Or Actions Taken?:  Awaiting treatment room.

## 2022-04-11 NOTE — ED Triage Notes (Signed)
Pt referred to ED from PCP's office for abd pain, nausea/vomiting, and hyperglycemia. Pt states she was seen at [facility] on Friday for same. Pt CBG in triage read HIGH. Hx of T2DM.

## 2022-04-11 NOTE — Assessment & Plan Note (Signed)
Severe Hyperglycemia despite taking home insulin. 1. Hyperglycemic crisis pathway 2. Insulin gtt per pathway 3. BHB only 1.1, not really c/w DKA 4. NPO for tonight 5. Repeat BMP in AM.

## 2022-04-11 NOTE — ED Notes (Signed)
Patient provided with water and grahmn crackers for PO challenge

## 2022-04-11 NOTE — Assessment & Plan Note (Addendum)
Currently seems volume depleted with AKI, pre-renal pattern. 1. Hold lasix 2. Hold entresto 3. Hold aldactone 4. Hold imdur and coreg due to soft BPs 5. Watch for development of volume overload with IVF and holding meds 6. Tele monitor

## 2022-04-11 NOTE — H&P (Signed)
History and Physical    Patient: Cassandra Adams IZT:[id number] DOB: 1946-01-11 DOA: 04/11/2022 DOS: the patient was seen and examined on 04/11/2022 PCP: Sharilyn Sites, MD  Patient coming from: Home  Chief Complaint:  Chief Complaint  Patient presents with   Hyperglycemia   Emesis   HPI: Cassandra Adams is a 76 y.o. female with medical history significant of DM2, CAD, TIA, PAF on eliquis and amiodarone, HFpEF, CKD 3B.  Pt in to ED 4 days ago with hyperglycemia.  Thurs began to have abd pain, constant, across lower abd.  Nausea and vomiting that has been constant since thurs.  Throwing up everything she tries to eat within a few mins.  No diarrhea, no constipation, had normal BM yesterday.  No h/o abd surg, no known sick contacts.  No change to DM meds.  Normally CBG in 200s but running high since Thursday.    Review of Systems: As mentioned in the history of present illness. All other systems reviewed and are negative. Past Medical History:  Diagnosis Date   (HFpEF) heart failure with preserved ejection fraction ([place])    Limited Echo 7/22: Mild asymmetrical LVH due to scar and thinning of basal posterior wall and background conc LVH, apical and mid segments hyperdynamic, EF 60-65, basal inf-lat and basal inf AK, normal RVSF, RVSP 26.6, mild LAE, mild-moderate MR, mild AV sclerosis w/o AS // Echo 5/22: EF 55-60, RVSP 27, mild to mod LAE, poss small to mod eff; AV sclerosis no AS   Arthritis    "all over"   CAD (coronary artery disease)    a. NSTEMI 12/2013 - occluded dominant RCA with L-R collaterals, moderate prox segmental LAD disease, for medical therapy initially, EF 60% with subtle inferobasal hypokinesia. Consider PCI for refractory CP.   CKD (chronic kidney disease), stage III ([place])    Hypertension    Migraines    "weekly" (01/06/2014)   Sickle cell trait ([place])    TIA (transient ischemic attack) 1978   Type II diabetes mellitus ([place])    Vertigo    Past  Surgical History:  Procedure Laterality Date   APPENDECTOMY  March 2014   had appendix frozen   CARDIAC CATHETERIZATION  "years ago" & 01/05/2014   CARDIOVERSION N/A 03/17/2014   Procedure: CARDIOVERSION;  Surgeon: [name], MD;  Location: [place];  Service: Cardiovascular;  Laterality: N/A;   CARDIOVERSION N/A 01/14/2021   Procedure: CARDIOVERSION;  Surgeon: [name], MD;  Location: [facility] ENDOSCOPY;  Service: Cardiovascular;  Laterality: N/A;   CARDIOVERSION N/A 12/13/2021   Procedure: CARDIOVERSION;  Surgeon: Thayer Headings, MD;  Location: [place];  Service: Cardiovascular;  Laterality: N/A;   CATARACT EXTRACTION W/ INTRAOCULAR LENS  IMPLANT, BILATERAL Bilateral 04/2013-05/2013   CORONARY ATHERECTOMY N/A 08/05/2021   Procedure: CORONARY ATHERECTOMY;  Surgeon: Sherren Mocha, MD;  Location: [place] CV LAB;  Service: Cardiovascular;  Laterality: N/A;   CORONARY STENT INTERVENTION N/A 08/05/2021   Procedure: CORONARY STENT INTERVENTION;  Surgeon: Sherren Mocha, MD;  Location: [place] CV LAB;  Service: Cardiovascular;  Laterality: N/A;   ENDARTERECTOMY Right 02/05/2017   Procedure: ENDARTERECTOMY CAROTID-RIGHT;  Surgeon: [name], MD;  Location: [facility];  Service: Vascular;  Laterality: Right;   ESOPHAGOGASTRODUODENOSCOPY  11/08/2007    Normal esophagus without evidence of Barrett, mass, erosion/ Normal stomach, duodenal bulb   ESOPHAGOGASTRODUODENOSCOPY (EGD) WITH PROPOFOL N/A 11/08/2020   Procedure: ESOPHAGOGASTRODUODENOSCOPY (EGD) WITH PROPOFOL;  Surgeon: Thornton Park, MD;  Location: [place];  Service:  Gastroenterology;  Laterality: N/A;   GASTRIC MOTILITY STUDY  11/13/2007   mildly delayed emptying subjectively, but normal  analysis-77% of tracer emptied at 2 hours   INTRAVASCULAR PRESSURE WIRE/FFR STUDY N/A 08/04/2021   Procedure: INTRAVASCULAR PRESSURE WIRE/FFR STUDY;  Surgeon: Nelva Bush, MD;  Location: [place] CV LAB;  Service:  Cardiovascular;  Laterality: N/A;   JOINT REPLACEMENT     LAPAROSCOPIC APPENDECTOMY N/A 09/15/2012   Procedure: APPENDECTOMY LAPAROSCOPIC;  Surgeon: [name], MD;  Location: [facility];  Service: General;  Laterality: N/A;   LEFT HEART CATH AND CORONARY ANGIOGRAPHY N/A 08/04/2021   Procedure: LEFT HEART CATH AND CORONARY ANGIOGRAPHY;  Surgeon: Nelva Bush, MD;  Location: [place] CV LAB;  Service: Cardiovascular;  Laterality: N/A;   LEFT HEART CATHETERIZATION WITH CORONARY ANGIOGRAM N/A 01/05/2014   Procedure: LEFT HEART CATHETERIZATION WITH CORONARY ANGIOGRAM;  Surgeon: [name], MD;  Location: [facility] CATH LAB;  Service: Cardiovascular;  Laterality: N/A;   REPLACEMENT TOTAL KNEE Right 05-03-06   TEE WITHOUT CARDIOVERSION N/A 03/17/2014   Procedure: TRANSESOPHAGEAL ECHOCARDIOGRAM (TEE);  Surgeon: [name], MD;  Location: [facility] ENDOSCOPY;  Service: Cardiovascular;  Laterality: N/A;   TONSILLECTOMY  1972   Social History:  reports that she has never smoked. She has never used smokeless tobacco. She reports current alcohol use. She reports that she does not use drugs.  Allergies  Allergen Reactions   Penicillins Hives    Has patient had a PCN reaction causing immediate rash, facial/tongue/throat swelling, SOB or lightheadedness with hypotension: no Has patient had a PCN reaction causing severe rash involving mucus membranes or skin necrosis: No  Has patient had a PCN reaction that required hospitalization: no Has patient had a PCN reaction occurring within the last 10 years: no If all of the above answers are "NO", then may proceed with Cephalosporin use.     Family History  Problem Relation Age of Onset   Hyperlipidemia Mother    Hypertension Mother    Heart attack Mother    Cancer Father        lung   Hypertension Sister    Diabetes Brother    Heart disease Brother    Hyperlipidemia Brother    Hypertension Brother    Heart attack Brother    Other Brother        DVT     Prior to Admission medications   Medication Sig Start Date End Date Taking? Authorizing Provider  amiodarone (PACERONE) 200 MG tablet Take 1 tablet (200 mg total) by mouth daily. Start taking from 12/23/21 12/23/21  Yes Darrick Meigs, Marge Duncans, MD  atorvastatin (LIPITOR) 80 MG tablet Take 1 tablet (80 mg total) by mouth every evening. 12/21/20  Yes Hilty, Nadean Corwin, MD  carvedilol (COREG) 6.25 MG tablet Take 6.25 mg by mouth 2 (two) times daily.   Yes [provider]  clopidogrel (PLAVIX) 75 MG tablet Take 1 tablet (75 mg total) by mouth daily. 09/17/21  Yes Tat, Shanon Brow, MD  colesevelam [facility]) 625 MG tablet Take 1,875 mg by mouth 2 (two) times daily with a meal. Noon and night   Yes [provider]  ELIQUIS 5 MG TABS tablet Take 1 tablet (5 mg total) by mouth 2 (two) times daily. 11/07/20  Yes Johnson, Clanford L, MD  empagliflozin (JARDIANCE) 10 MG TABS tablet Take 10 mg by mouth daily.   Yes [provider]  furosemide (LASIX) 40 MG tablet Take 40 mg by mouth daily. 12/21/21  Yes [provider]  Insulin Glargine (BASAGLAR KWIKPEN) 100 UNIT/ML Inject 10 Units into the skin at bedtime. Patient taking differently: Inject 25 Units into the skin at bedtime. 08/07/21  Yes Dahal, Marlowe Aschoff, MD  isosorbide mononitrate (IMDUR) 60 MG 24 hr tablet Take 60 mg by mouth every morning. 03/15/22  Yes [provider]  metolazone (ZAROXOLYN) 5 MG tablet Take 5 mg by mouth once. 03/28/22  Yes [provider]  nitroGLYCERIN (NITROSTAT) 0.4 MG SL tablet PLACE 1 TABLET UNDER THE TONGUE EVERY 5 MINUTES AS NEEDED FOR CHEST PAIN (UP TO 3 DOSES) Patient taking differently: Place 0.4 mg under the tongue every 5 (five) minutes x 3 doses as needed for chest pain. 01/02/22  Yes Hilty, Nadean Corwin, MD  NOVOLOG 100 UNIT/ML injection Inject 6-9 Units into the skin 3 (three) times daily with meals. Sliding Scale 07/28/20  Yes [provider]  polyethylene glycol (MIRALAX / GLYCOLAX) 17  g packet Take 17 g by mouth daily. 11/12/20  Yes Dessa Phi, DO  sacubitril-valsartan (ENTRESTO) 97-103 MG Take 0.5 tablets by mouth 2 (two) times daily. Start 12/28/21-STOP lasix when started Patient taking differently: Take 1 tablet by mouth 2 (two) times daily. 12/21/21  Yes Duke, Tami Lin, PA  spironolactone (ALDACTONE) 50 MG tablet Take 50 mg by mouth daily. 03/06/22  Yes [provider]  Vitamin D, Cholecalciferol, 25 MCG (1000 UT) TABS Take 2,000 Units by mouth daily.   Yes [provider]  B-D ULTRAFINE III SHORT PEN 31G X 8 MM MISC SMARTSIG:Injection Daily 07/06/21   [provider]  BD INSULIN SYRINGE U/F 31G X 5/16" 0.5 ML MISC Use to take with Novolog [facility] 01/07/21   Nida, Marella Chimes, MD  Continuous Blood Gluc Receiver (FREESTYLE LIBRE 2 READER) DEVI 1 each by Does not apply route daily in the afternoon. 06/15/21   Amin, Jeanella Flattery, MD  Continuous Blood Gluc Sensor (FREESTYLE LIBRE 2 SENSOR) MISC 1 each by Does not apply route daily in the afternoon. 06/15/21   Damita Lack, MD  ONETOUCH ULTRA test strip TESTING 3 TIMES DAILY 06/20/21   [provider]    Physical Exam: Vitals:   04/11/22 2030 04/11/22 2100 04/11/22 2130 04/11/22 2200  BP: (!) 116/59 (!) 116/47 (!) 149/52 (!) 163/52  Pulse: (!) 51 (!) 50 (!) 58 63  Resp: '15 14 16 17  '$ Temp:      TempSrc:      SpO2: 98% 95% 99% 100%  Weight:      Height:       Constitutional: NAD, calm, comfortable Eyes: PERRL, lids and conjunctivae normal ENMT: Mucous membranes are dry. Posterior pharynx clear of any exudate or lesions.Normal dentition.  Neck: normal, supple, no masses, no thyromegaly Respiratory: clear to auscultation bilaterally, no wheezing, no crackles. Normal respiratory effort. No accessory muscle use.  Cardiovascular: Regular rate and rhythm, no murmurs / rubs / gallops. No extremity edema. 2+ pedal pulses. No carotid bruits.  Abdomen: Diffuse TTP, no rebound, no  guarding Musculoskeletal: no clubbing / cyanosis. No joint deformity upper and lower extremities. Good ROM, no contractures. Normal muscle tone.  Skin: no rashes, lesions, ulcers. No induration Neurologic: CN 2-12 grossly intact. Sensation intact, DTR normal. Strength 5/5 in all 4.  Psychiatric: Normal judgment and insight. Alert and oriented x 3. Normal mood.   Data Reviewed:    BHB 1.1     Latest Ref Rng & Units 04/11/2022    3:44 PM 04/11/2022   12:46 PM 04/07/2022  7:47 PM  CBC  WBC 4.0 - 10.5 K/uL  4.6    Hemoglobin 12.0 - 15.0 g/dL 11.9  13.2  12.6   Hematocrit 36.0 - 46.0 % 35.0  37.9  37.0   Platelets 150 - 400 K/uL  141        Latest Ref Rng & Units 04/11/2022    3:44 PM 04/11/2022   12:46 PM 04/07/2022    7:47 PM  CMP  Glucose 70 - 99 mg/dL  617    BUN 8 - 23 mg/dL  63    Creatinine 0.44 - 1.00 mg/dL  2.85    Sodium 135 - 145 mmol/L 135  141  138   Potassium 3.5 - 5.1 mmol/L 5.0  5.0  4.9   Chloride 98 - 111 mmol/L  106    CO2 22 - 32 mmol/L  21    Calcium 8.9 - 10.3 mg/dL  10.7    Total Protein 6.5 - 8.1 g/dL  6.7    Total Bilirubin 0.3 - 1.2 mg/dL  0.5    Alkaline Phos 38 - 126 U/L  60    AST 15 - 41 U/L  20    ALT 0 - 44 U/L  20     CT AP = no acute findings  Assessment and Plan: * Hyperglycemia due to type 2 diabetes mellitus (HCC) Severe Hyperglycemia despite taking home insulin. Hyperglycemic crisis pathway Insulin gtt per pathway BHB only 1.1, not really c/w DKA NPO for tonight Repeat BMP in AM.  AKI (acute kidney injury) ([place]) AKI on CKD 3B vs progression to CKD 4. Looks pre-renal today with soft BPs, BUN:Cr ratio of 20:1. IVF Hold diuretics Hold home BP meds Hold nephrotoxic entresto + Jardiance Strict intake and output Repeat BMP in AM  Stage 3b chronic kidney disease (CKD) (HCC) See AKI above  Chronic diastolic CHF (congestive heart failure) (HCC) Currently seems volume depleted with AKI, pre-renal pattern. Hold lasix Hold  entresto Hold aldactone Hold imdur and coreg due to soft BPs Watch for development of volume overload with IVF and holding meds Tele monitor  Atrial fibrillation, chronic (HCC) Continue amiodarone Continue Eliquis Hold coreg      Advance Care Planning:   Code Status: Full Code  Consults: None  Family Communication: No family in room  Severity of Illness: The appropriate patient status for this patient is OBSERVATION. Observation status is judged to be reasonable and necessary in order to provide the required intensity of service to ensure the patient's safety. The patient's presenting symptoms, physical exam findings, and initial radiographic and laboratory data in the context of their medical condition is felt to place them at decreased risk for further clinical deterioration. Furthermore, it is anticipated that the patient will be medically stable for discharge from the hospital within 2 midnights of admission.   Author: Etta Quill., DO 04/11/2022 10:41 PM  For on call review [email or web address].

## 2022-04-11 NOTE — ED Provider Notes (Signed)
[facility] EMERGENCY DEPARTMENT Provider Note   CSN: [id number] Arrival date & time: 04/11/22  1129     History {Add pertinent medical, surgical, social history, OB history to HPI:1} Chief Complaint  Patient presents with   Hyperglycemia   Emesis    Cassandra Adams is a 76 y.o. female.  HPI      76 year old female with a history of heart failure with preserved ejection fraction, coronary artery disease, CKD stage III, hypertension, type 2 diabetes, TIA, paroxysmal atrial fibrillation on Eliquis  Came to the emergency department 4 days ago with hyperglycemia Thursday began to have abdominal pain, constant, across lower abdomen to navel Nausea, since THursday has been throwing up everything within a few minutes, at least 5 times a day No diarrhea, no constipation Had normal BM yesterday Has some pain with straining with urination from stomach No  fever Sore throat started Thursday, thinks from throwing up No cough, dyspnea, chest pain Just gets tired  No hx of abdominal surgery No known sick contacts  No change to DM medications Normally glucose in 200s, but has been running high 7-8/10 pain    Past Medical History:  Diagnosis Date   (HFpEF) heart failure with preserved ejection fraction ([place])    Limited Echo 7/22: Mild asymmetrical LVH due to scar and thinning of basal posterior wall and background conc LVH, apical and mid segments hyperdynamic, EF 60-65, basal inf-lat and basal inf AK, normal RVSF, RVSP 26.6, mild LAE, mild-moderate MR, mild AV sclerosis w/o AS // Echo 5/22: EF 55-60, RVSP 27, mild to mod LAE, poss small to mod eff; AV sclerosis no AS   Arthritis    "all over"   CAD (coronary artery disease)    a. NSTEMI 12/2013 - occluded dominant RCA with L-R collaterals, moderate prox segmental LAD disease, for medical therapy initially, EF 60% with subtle inferobasal hypokinesia. Consider PCI for refractory CP.   CKD (chronic kidney  disease), stage III ([place])    Hypertension    Migraines    "weekly" (01/06/2014)   Sickle cell trait (HCC)    TIA (transient ischemic attack) 1978   Type II diabetes mellitus ([place])    Vertigo     Home Medications Prior to Admission medications   Medication Sig Start Date End Date Taking? Authorizing Provider  amiodarone (PACERONE) 200 MG tablet Take 1 tablet (200 mg total) by mouth daily. Start taking from 12/23/21 12/23/21   Oswald Hillock, MD  atorvastatin (LIPITOR) 80 MG tablet Take 1 tablet (80 mg total) by mouth every evening. 12/21/20   Hilty, Nadean Corwin, MD  B-D ULTRAFINE III SHORT PEN 31G X 8 MM MISC SMARTSIG:Injection Daily 07/06/21   [provider]  BD INSULIN SYRINGE U/F 31G X 5/16" 0.5 ML MISC Use to take with Novolog [facility] 01/07/21   Cassandria Anger, MD  clopidogrel (PLAVIX) 75 MG tablet Take 1 tablet (75 mg total) by mouth daily. 09/17/21   Orson Eva, MD  colesevelam [facility]) 625 MG tablet Take 1,875 mg by mouth 2 (two) times daily with a meal. Noon and night    [provider]  Continuous Blood Gluc Receiver (FREESTYLE LIBRE 2 READER) DEVI 1 each by Does not apply route daily in the afternoon. 06/15/21   Amin, Jeanella Flattery, MD  Continuous Blood Gluc Sensor (FREESTYLE LIBRE 2 SENSOR) MISC 1 each by Does not apply route daily in the afternoon. 06/15/21   Amin, Jeanella Flattery, MD  ELIQUIS 5 MG TABS  tablet Take 1 tablet (5 mg total) by mouth 2 (two) times daily. 11/07/20   Johnson, Clanford L, MD  empagliflozin (JARDIANCE) 10 MG TABS tablet Take 10 mg by mouth daily.    [provider]  furosemide (LASIX) 40 MG tablet Take 40 mg by mouth daily. 12/21/21   [provider]  Insulin Glargine (BASAGLAR KWIKPEN) 100 UNIT/ML Inject 10 Units into the skin at bedtime. Patient taking differently: Inject 25 Units into the skin at bedtime. 08/07/21   Terrilee Croak, MD  isosorbide mononitrate (IMDUR) 60 MG 24 hr tablet Take 60 mg by mouth every morning. 03/15/22    [provider]  nitroGLYCERIN (NITROSTAT) 0.4 MG SL tablet PLACE 1 TABLET UNDER THE TONGUE EVERY 5 MINUTES AS NEEDED FOR CHEST PAIN (UP TO 3 DOSES) Patient not taking: Reported on 04/08/2022 01/02/22   [name], MD  NOVOLOG 100 UNIT/ML injection Inject 6-9 Units into the skin 3 (three) times daily with meals. Sliding Scale 07/28/20   [provider]  [facility] ULTRA test strip TESTING 3 TIMES DAILY 06/20/21   [provider]  polyethylene glycol (MIRALAX / GLYCOLAX) 17 g packet Take 17 g by mouth daily. Patient taking differently: Take 17 g by mouth daily as needed. 11/12/20   Dessa Phi, DO  sacubitril-valsartan (ENTRESTO) 97-103 MG Take 0.5 tablets by mouth 2 (two) times daily. Start 12/28/21-STOP lasix when started Patient taking differently: Take 1 tablet by mouth 2 (two) times daily. 12/21/21   Duke, Tami Lin, PA  Vitamin D, Cholecalciferol, 25 MCG (1000 UT) TABS Take 2,000 Units by mouth daily.    [provider]      Allergies    Penicillins    Review of Systems   Review of Systems  Physical Exam Updated Vital Signs BP (!) 195/76   Pulse 76   Temp 98.6 F (37 C)   Resp 17   SpO2 99%  Physical Exam  ED Results / Procedures / Treatments   Labs (all labs ordered are listed, but only abnormal results are displayed) Labs Reviewed  CBC - Abnormal; Notable for the following components:      Result Value   Platelets 141 (*)    All other components within normal limits  COMPREHENSIVE METABOLIC PANEL - Abnormal; Notable for the following components:   CO2 21 (*)    Glucose, Bld 617 (*)    BUN 63 (*)    Creatinine, Ser 2.85 (*)    Calcium 10.7 (*)    Albumin 3.4 (*)    GFR, Estimated 17 (*)    All other components within normal limits  CBG MONITORING, ED - Abnormal; Notable for the following components:   Glucose-Capillary >600 (*)    All other components within normal limits  CBG MONITORING, ED - Abnormal; Notable for the  following components:   Glucose-Capillary >600 (*)    All other components within normal limits  URINALYSIS, ROUTINE W REFLEX MICROSCOPIC  BETA-HYDROXYBUTYRIC ACID  BLOOD GAS, VENOUS    EKG None  Radiology No results found.  Procedures Procedures  {Document cardiac monitor, telemetry assessment procedure when appropriate:1}  Medications Ordered in ED Medications  lactated ringers bolus 1,000 mL (has no administration in time range)  ondansetron (ZOFRAN-ODT) disintegrating tablet 4 mg (4 mg Oral Given 04/11/22 1242)    ED Course/ Medical Decision Making/ A&P  Medical Decision Making Amount and/or Complexity of Data Reviewed Radiology: ordered.  Risk Prescription drug management.   76 year old female with a history of heart failure with preserved ejection fraction, coronary artery disease, CKD stage III, hypertension, type 2 diabetes, TIA, paroxysmal atrial fibrillation on Eliquis  Labs completed and personally evaluated interpreted by me show a glucose of 617, without signs of diabetic ketoacidosis.  Creatinine is similar to 4 days ago, and slightly increased from August.  There is no significant anemia or leukocytosis  {Document critical care time when appropriate:1} {Document review of labs and clinical decision tools ie heart score, Chads2Vasc2 etc:1}  {Document your independent review of radiology images, and any outside records:1} {Document your discussion with family members, caretakers, and with consultants:1} {Document social determinants of health affecting pt's care:1} {Document your decision making why or why not admission, treatments were needed:1} Final Clinical Impression(s) / ED Diagnoses Final diagnoses:  None    Rx / DC Orders ED Discharge Orders     None

## 2022-04-11 NOTE — Assessment & Plan Note (Signed)
AKI on CKD 3B vs progression to CKD 4. 1. Looks pre-renal today with soft BPs, BUN:Cr ratio of 20:1. 2. IVF 3. Hold diuretics 4. Hold home BP meds 5. Hold nephrotoxic entresto + Jardiance 6. Strict intake and output 7. Repeat BMP in AM

## 2022-04-11 NOTE — Assessment & Plan Note (Signed)
1. Continue amiodarone 2. Continue Eliquis 3. Hold coreg

## 2022-04-12 DIAGNOSIS — E1165 Type 2 diabetes mellitus with hyperglycemia: Secondary | ICD-10-CM | POA: Diagnosis not present

## 2022-04-12 DIAGNOSIS — N179 Acute kidney failure, unspecified: Secondary | ICD-10-CM | POA: Diagnosis not present

## 2022-04-12 DIAGNOSIS — Z794 Long term (current) use of insulin: Secondary | ICD-10-CM | POA: Diagnosis not present

## 2022-04-12 DIAGNOSIS — I482 Chronic atrial fibrillation, unspecified: Secondary | ICD-10-CM | POA: Diagnosis not present

## 2022-04-12 LAB — CBG MONITORING, ED
Glucose-Capillary: 108 mg/dL — ABNORMAL HIGH (ref 70–99)
Glucose-Capillary: 110 mg/dL — ABNORMAL HIGH (ref 70–99)
Glucose-Capillary: 126 mg/dL — ABNORMAL HIGH (ref 70–99)
Glucose-Capillary: 141 mg/dL — ABNORMAL HIGH (ref 70–99)
Glucose-Capillary: 184 mg/dL — ABNORMAL HIGH (ref 70–99)
Glucose-Capillary: 193 mg/dL — ABNORMAL HIGH (ref 70–99)
Glucose-Capillary: 213 mg/dL — ABNORMAL HIGH (ref 70–99)
Glucose-Capillary: 249 mg/dL — ABNORMAL HIGH (ref 70–99)
Glucose-Capillary: 259 mg/dL — ABNORMAL HIGH (ref 70–99)
Glucose-Capillary: 310 mg/dL — ABNORMAL HIGH (ref 70–99)

## 2022-04-12 LAB — BASIC METABOLIC PANEL
Anion gap: 7 (ref 5–15)
BUN: 51 mg/dL — ABNORMAL HIGH (ref 8–23)
CO2: 26 mmol/L (ref 22–32)
Calcium: 10.5 mg/dL — ABNORMAL HIGH (ref 8.9–10.3)
Chloride: 109 mmol/L (ref 98–111)
Creatinine, Ser: 2.38 mg/dL — ABNORMAL HIGH (ref 0.44–1.00)
GFR, Estimated: 20 mL/min — ABNORMAL LOW (ref >60)
Glucose, Bld: 174 mg/dL — ABNORMAL HIGH (ref 70–99)
Potassium: 3.9 mmol/L (ref 3.5–5.1)
Sodium: 142 mmol/L (ref 135–145)

## 2022-04-12 LAB — HEMOGLOBIN A1C
Hgb A1c MFr Bld: 11.5 % — ABNORMAL HIGH (ref 4.8–5.6)
Mean Plasma Glucose: 283.35 mg/dL

## 2022-04-12 MED ORDER — ONDANSETRON HCL 4 MG/2ML IJ SOLN
4.0000 mg | Freq: Once | INTRAMUSCULAR | Status: AC
  Administered 2022-04-12: 4 mg via INTRAVENOUS
  Filled 2022-04-12: qty 2

## 2022-04-12 MED ORDER — INSULIN ASPART 100 UNIT/ML IJ SOLN
0.0000 [IU] | Freq: Three times a day (TID) | INTRAMUSCULAR | Status: DC
  Administered 2022-04-12: 8 [IU] via SUBCUTANEOUS
  Administered 2022-04-13: 3 [IU] via SUBCUTANEOUS
  Administered 2022-04-13: 5 [IU] via SUBCUTANEOUS
  Administered 2022-04-13: 2 [IU] via SUBCUTANEOUS
  Administered 2022-04-14: 3 [IU] via SUBCUTANEOUS
  Administered 2022-04-14 – 2022-04-15 (×2): 5 [IU] via SUBCUTANEOUS
  Administered 2022-04-15: 8 [IU] via SUBCUTANEOUS
  Administered 2022-04-16: 11 [IU] via SUBCUTANEOUS
  Administered 2022-04-17: 5 [IU] via SUBCUTANEOUS

## 2022-04-12 MED ORDER — INSULIN ASPART 100 UNIT/ML IJ SOLN
0.0000 [IU] | Freq: Every day | INTRAMUSCULAR | Status: DC
  Administered 2022-04-12 – 2022-04-14 (×2): 2 [IU] via SUBCUTANEOUS
  Administered 2022-04-15: 3 [IU] via SUBCUTANEOUS

## 2022-04-12 MED ORDER — ACETAMINOPHEN 325 MG PO TABS
650.0000 mg | ORAL_TABLET | Freq: Four times a day (QID) | ORAL | Status: DC | PRN
  Administered 2022-04-12 – 2022-04-16 (×3): 650 mg via ORAL
  Filled 2022-04-12 (×3): qty 2

## 2022-04-12 MED ORDER — INSULIN GLARGINE-YFGN 100 UNIT/ML ~~LOC~~ SOLN
15.0000 [IU] | Freq: Every day | SUBCUTANEOUS | Status: DC
  Administered 2022-04-12: 15 [IU] via SUBCUTANEOUS
  Filled 2022-04-12 (×2): qty 0.15

## 2022-04-12 MED ORDER — LACTATED RINGERS IV SOLN: INTRAVENOUS | Status: DC

## 2022-04-12 NOTE — ED Notes (Signed)
Messaged MD for medication for c/o HA

## 2022-04-12 NOTE — Progress Notes (Signed)
PROGRESS NOTE    Cassandra Adams  ACZ:[id number] DOB: 01-Jul-1945 DOA: 04/11/2022 PCP: Sharilyn Sites, MD   Chief Complaint  Patient presents with   Hyperglycemia   Emesis    Brief Narrative:    Cassandra Adams is a 76 y.o. female with medical history significant of DM2, CAD, TIA, PAF on eliquis and amiodarone, HFpEF, CKD 3B. Pt in to ED 4 days ago with hyperglycemia.  Well she developed abdominal pain, nausea and vomiting which was constant over last few days, has diarrhea, constipation, last BM was yesterday, CBG was significantly elevated since Thursday, so she presents to ED for further management, work-up was significant for worsening renal failure, hyperglycemia, so she is admitted for further work-up.    Assessment & Plan:   Principal Problem:   Hyperglycemia due to type 2 diabetes mellitus ([place]) Active Problems:   AKI (acute kidney injury) ([place])   Stage 3b chronic kidney disease (CKD) (HCC)   Nausea & vomiting   Atrial fibrillation, chronic (HCC)   Chronic diastolic CHF (congestive heart failure) ([place])   Hyperglycemia due to type 2 diabetes mellitus ([place]) Severe Hyperglycemia despite taking home insulin.  Is most likely stress related, with her significant nausea and vomiting -Initially on heparin GTT, this morning I have transitioned back to Semglee and insulin sliding scale -Oral intake remains poor, unreliable, will keep on as needed Zofran, and keep on carb modified diet -[place] clinically dehydrated, dry tongue and delayed skin turgor, creatinine but not back to baseline yet, continue with IV fluids  AKI (acute kidney injury) ([place]) AKI on CKD 3B  -Baseline creatinine around 2 -Continue with IV fluids and hold nephrotoxic medications, creatinine on admission 2.78, it is around 2.  3 8 this morning, continue to hold nephrotoxic medications including Entresto and Jardiance, avoid low blood pressure, continue to hold diuretics and keep on IV fluids   Stage  3b chronic kidney disease (CKD) ([place]) See AKI above   Chronic diastolic CHF (congestive heart failure) ([place]) Currently seems volume depleted with AKI, pre-renal pattern. -Hold Lasix, Entresto, Aldactone and Imdur  Atrial fibrillation, chronic ([place]) -Continue with amiodarone and Eliquis -Resume Coreg when blood pressure has improved   DVT prophylaxis: Eliquis Code Status: Full Family Communication: none at bedside Disposition:       Consultants:  None   Subjective:  Reports she is feeling better, but still with some nausea, poor appetite and oral intake, reports generalized weakness and fatigue  Objective: Vitals:   04/12/22 0830 04/12/22 0900 04/12/22 1200 04/12/22 1202  BP: (!) 133/50 (!) 123/46 (!) 129/49   Pulse: (!) 48 (!) 50 (!) 56   Resp: '14 14 18   '$ Temp:    98 F (36.7 C)  TempSrc:      SpO2: 95% 96% 98%   Weight:      Height:        Intake/Output Summary (Last 24 hours) at 04/12/2022 1241 Last data filed at 04/11/2022 1803 Gross per 24 hour  Intake 1004.07 ml  Output 650 ml  Net 354.07 ml   Filed Weights   04/11/22 1712  Weight: 72.6 kg    Examination:  Awake Alert, Oriented X 3, frail, deconditioned Symmetrical Chest wall movement, Good air movement bilaterally, CTAB RRR,No Gallops,Rubs or new Murmurs, No Parasternal Heave +ve B.Sounds, Abd Soft, No tenderness, No rebound - guarding or rigidity. No Cyanosis, Clubbing or edema, No new Rash or bruise      Data Reviewed: I have personally reviewed following  labs and imaging studies  CBC: Recent Labs  Lab 04/07/22 1938 04/07/22 1947 04/11/22 1246 04/11/22 1544  WBC 5.0  --  4.6  --   NEUTROABS 3.7  --   --   --   HGB 13.1 12.6 13.2 11.9*  HCT 36.4 37.0 37.9 35.0*  MCV 80.4  --  81.9  --   PLT 162  --  141*  --     Basic Metabolic Panel: Recent Labs  Lab 04/07/22 1938 04/07/22 1947 04/11/22 1246 04/11/22 1544 04/12/22 0256  NA 138 138 141 135 142  K 4.9 4.9 5.0 5.0 3.9  CL  103  --  106  --  109  CO2 24  --  21*  --  26  GLUCOSE 617*  --  617*  --  174*  BUN 58*  --  63*  --  51*  CREATININE 2.83*  --  2.85*  --  2.38*  CALCIUM 11.2*  --  10.7*  --  10.5*    GFR: Estimated Creatinine Clearance: 20.2 mL/min (A) (by C-G formula based on SCr of 2.38 mg/dL (H)).  Liver Function Tests: Recent Labs  Lab 04/07/22 1938 04/11/22 1246  AST 27 20  ALT 24 20  ALKPHOS 77 60  BILITOT 0.5 0.5  PROT 6.8 6.7  ALBUMIN 3.7 3.4*    CBG: Recent Labs  Lab 04/12/22 0404 04/12/22 0529 04/12/22 0703 04/12/22 0809 04/12/22 1157  GLUCAP 141* 126* 110* 108* 184*     Recent Results (from the past 240 hour(s))  Resp Panel by RT-PCR (Flu A&B, Covid) Anterior Nasal Swab     Status: None   Collection Time: 04/11/22  3:26 PM   Specimen: Anterior Nasal Swab  Result Value Ref Range Status   SARS Coronavirus 2 by RT PCR NEGATIVE NEGATIVE Final    Comment: (NOTE) SARS-CoV-2 target nucleic acids are NOT DETECTED.  The SARS-CoV-2 RNA is generally detectable in upper respiratory specimens during the acute phase of infection. The lowest concentration of SARS-CoV-2 viral copies this assay can detect is 138 copies/mL. A negative result does not preclude SARS-Cov-2 infection and should not be used as the sole basis for treatment or other patient management decisions. A negative result may occur with  improper specimen collection/handling, submission of specimen other than nasopharyngeal swab, presence of viral mutation(s) within the areas targeted by this assay, and inadequate number of viral copies(<138 copies/mL). A negative result must be combined with clinical observations, patient history, and epidemiological information. The expected result is Negative.  Fact Sheet for Patients:  EntrepreneurPulse.com.au  Fact Sheet for Healthcare Providers:  IncredibleEmployment.be  This test is no t yet approved or cleared by the [place] FDA and  has been authorized for detection and/or diagnosis of SARS-CoV-2 by FDA under an Emergency Use Authorization (EUA). This EUA will remain  in effect (meaning this test can be used) for the duration of the COVID-19 declaration under Section 564(b)(1) of the Act, 21 U.S.C.section 360bbb-3(b)(1), unless the authorization is terminated  or revoked sooner.       Influenza A by PCR NEGATIVE NEGATIVE Final   Influenza B by PCR NEGATIVE NEGATIVE Final    Comment: (NOTE) The Xpert Xpress SARS-CoV-2/FLU/RSV plus assay is intended as an aid in the diagnosis of influenza from Nasopharyngeal swab specimens and should not be used as a sole basis for treatment. Nasal washings and aspirates are unacceptable for Xpert Xpress SARS-CoV-2/FLU/RSV testing.  Fact Sheet for Patients: EntrepreneurPulse.com.au  Fact Sheet  for Healthcare Providers: IncredibleEmployment.be  This test is not yet approved or cleared by the [place] and has been authorized for detection and/or diagnosis of SARS-CoV-2 by FDA under an Emergency Use Authorization (EUA). This EUA will remain in effect (meaning this test can be used) for the duration of the COVID-19 declaration under Section 564(b)(1) of the Act, 21 U.S.C. section 360bbb-3(b)(1), unless the authorization is terminated or revoked.  Performed at [facility], [place] [address] [place], [place] [address]   Group A Strep by PCR     Status: None   Collection Time: 04/11/22  3:26 PM   Specimen: Anterior Nasal Swab; Sterile Swab  Result Value Ref Range Status   Group A Strep by PCR NOT DETECTED NOT DETECTED Final    Comment: Performed at [facility], [place] [address], [place], [place] [address]         Radiology Studies: CT ABDOMEN PELVIS WO CONTRAST  Result Date: 04/11/2022 CLINICAL DATA:  Left lower quadrant abdominal pain EXAM: CT ABDOMEN AND PELVIS WITHOUT CONTRAST TECHNIQUE:  Multidetector CT imaging of the abdomen and pelvis was performed following the standard protocol without IV contrast. RADIATION DOSE REDUCTION: This exam was performed according to the departmental dose-optimization program which includes automated exposure control, adjustment of the mA and/or kV according to patient size and/or use of iterative reconstruction technique. COMPARISON:  CT abdomen and pelvis 09/14/2021 FINDINGS: Lower chest: No acute abnormality. Hepatobiliary: No focal liver abnormality is seen. No gallstones, gallbladder wall thickening, or biliary dilatation. Pancreas: Unremarkable. Spleen: Unremarkable. Adrenals/Urinary Tract: Unremarkable adrenal glands. Unremarkable noncontrast appearance of the kidneys. No urinary calculi or hydronephrosis. Unremarkable bladder. Stomach/Bowel: Unremarkable stomach. Normal caliber large and small bowel. Moderate colonic stool burden. Vascular/Lymphatic: Aortic atherosclerosis. No enlarged abdominal or pelvic lymph nodes. Reproductive: Calcified fibroid in the uterus. No suspicious adnexal mass. Other: No free intraperitoneal fluid or air. Musculoskeletal: No acute osseous abnormality. Thoracolumbar spondylosis. Body wall edema. Small fat containing umbilical hernia. Small fat containing bilateral inguinal hernias. Coarse calcifications both breasts. IMPRESSION: No acute findings in the abdomen or pelvis. Aortic Atherosclerosis (ICD10-I70.0). Electronically Signed   By: Placido Sou M.D.   On: 04/11/2022 18:59        Scheduled Meds:  amiodarone  200 mg Oral Daily   apixaban  5 mg Oral BID   atorvastatin  80 mg Oral QPM   clopidogrel  75 mg Oral Daily   insulin aspart  0-15 Units Subcutaneous TID WC   insulin aspart  0-5 Units Subcutaneous QHS   insulin glargine-yfgn  15 Units Subcutaneous Daily   Continuous Infusions:  lactated ringers 50 mL/hr at 04/12/22 0928     LOS: 0 days      Phillips Climes, MD [facility]   To contact  the attending provider between 7A-7P or the covering provider during after hours 7P-7A, please log into the web site [email or web address] and access using universal [place] password for that web site. If you do not have the password, please call the hospital operator.  04/12/2022, 12:41 PM

## 2022-04-12 NOTE — ED Notes (Signed)
Cassandra Adams [phone] would like an update asap

## 2022-04-12 NOTE — Inpatient Diabetes Management (Signed)
Inpatient Diabetes Program Recommendations  AACE/ADA: New Consensus Statement on Inpatient Glycemic Control (2015)  Target Ranges:  Prepandial:   less than 140 mg/dL      Peak postprandial:   less than 180 mg/dL (1-2 hours)      Critically ill patients:  140 - 180 mg/dL     Review of Glycemic Control  Latest Reference Range & Units 04/12/22 04:04 04/12/22 05:29 04/12/22 07:03 04/12/22 08:09 04/12/22 11:57  Glucose-Capillary 70 - 99 mg/dL 141 (H) 126 (H) 110 (H) 108 (H) 184 (H)   Diabetes history: DM 2 Outpatient Diabetes medications:  Basaglar 25 units daily, Jardiance 10 mg daily, Novolog 6-9 units tid with meals  Current orders for Inpatient glycemic control:  Novolog moderate tid with meals and HS Semglee 15 units daily  Inpatient Diabetes Program Recommendations: Spoke with patient regarding DM and current elevated A1C.  She states that last week she had run out of test strips and therefore was not taking insulin since she was unable to monitor (ED visit on 10/13).  She also had run out of insulin pen needles.  She now has these supplies but states that blood sugars have not been well controlled.  She states that she was taking her basal insulin - (Basaglar 25 units q HS) however blood sugars have not been well controlled. Patient see's Dr. Hilma Favors and per chart review, she has not seen him since 10/06/21.  Likely needs adjustment in home DM medications.   Transitioned off insulin drip this morning.   Reminded patient of importance of controlling blood sugars.  She states that her stomach still hurts and so does her head.  Will follow.  Thanks,  Adah Perl, RN, BC-ADM Inpatient Diabetes Coordinator Pager [phone]  (8a-5p)

## 2022-04-12 NOTE — Care Management Obs Status (Signed)
[facility] NOTIFICATION   Patient Details  Name: DAVEY BERGSMA MRN: [id number] Date of Birth: 01-26-1946   Medicare Observation Status Notification Given:  Ernesta Amble, RN 04/12/2022, 11:08 PM

## 2022-04-13 ENCOUNTER — Telehealth: Payer: Self-pay | Admitting: Nurse Practitioner

## 2022-04-13 DIAGNOSIS — E1122 Type 2 diabetes mellitus with diabetic chronic kidney disease: Secondary | ICD-10-CM | POA: Diagnosis not present

## 2022-04-13 DIAGNOSIS — E1165 Type 2 diabetes mellitus with hyperglycemia: Secondary | ICD-10-CM | POA: Diagnosis not present

## 2022-04-13 DIAGNOSIS — Z8673 Personal history of transient ischemic attack (TIA), and cerebral infarction without residual deficits: Secondary | ICD-10-CM | POA: Diagnosis not present

## 2022-04-13 DIAGNOSIS — I482 Chronic atrial fibrillation, unspecified: Secondary | ICD-10-CM | POA: Diagnosis not present

## 2022-04-13 DIAGNOSIS — I252 Old myocardial infarction: Secondary | ICD-10-CM | POA: Diagnosis not present

## 2022-04-13 DIAGNOSIS — Z7901 Long term (current) use of anticoagulants: Secondary | ICD-10-CM | POA: Diagnosis not present

## 2022-04-13 DIAGNOSIS — I2510 Atherosclerotic heart disease of native coronary artery without angina pectoris: Secondary | ICD-10-CM | POA: Diagnosis not present

## 2022-04-13 DIAGNOSIS — M159 Polyosteoarthritis, unspecified: Secondary | ICD-10-CM | POA: Diagnosis not present

## 2022-04-13 DIAGNOSIS — N1832 Chronic kidney disease, stage 3b: Secondary | ICD-10-CM | POA: Diagnosis not present

## 2022-04-13 DIAGNOSIS — N179 Acute kidney failure, unspecified: Secondary | ICD-10-CM | POA: Diagnosis not present

## 2022-04-13 DIAGNOSIS — R112 Nausea with vomiting, unspecified: Secondary | ICD-10-CM | POA: Diagnosis not present

## 2022-04-13 DIAGNOSIS — Z794 Long term (current) use of insulin: Secondary | ICD-10-CM | POA: Diagnosis not present

## 2022-04-13 DIAGNOSIS — Z7902 Long term (current) use of antithrombotics/antiplatelets: Secondary | ICD-10-CM | POA: Diagnosis not present

## 2022-04-13 DIAGNOSIS — D573 Sickle-cell trait: Secondary | ICD-10-CM | POA: Diagnosis not present

## 2022-04-13 DIAGNOSIS — L89152 Pressure ulcer of sacral region, stage 2: Secondary | ICD-10-CM | POA: Diagnosis not present

## 2022-04-13 DIAGNOSIS — R739 Hyperglycemia, unspecified: Secondary | ICD-10-CM | POA: Diagnosis not present

## 2022-04-13 DIAGNOSIS — I5032 Chronic diastolic (congestive) heart failure: Secondary | ICD-10-CM | POA: Diagnosis not present

## 2022-04-13 DIAGNOSIS — I480 Paroxysmal atrial fibrillation: Secondary | ICD-10-CM | POA: Diagnosis not present

## 2022-04-13 DIAGNOSIS — I130 Hypertensive heart and chronic kidney disease with heart failure and stage 1 through stage 4 chronic kidney disease, or unspecified chronic kidney disease: Secondary | ICD-10-CM | POA: Diagnosis not present

## 2022-04-13 DIAGNOSIS — Z7984 Long term (current) use of oral hypoglycemic drugs: Secondary | ICD-10-CM | POA: Diagnosis not present

## 2022-04-13 DIAGNOSIS — Z880 Allergy status to penicillin: Secondary | ICD-10-CM | POA: Diagnosis not present

## 2022-04-13 DIAGNOSIS — E860 Dehydration: Secondary | ICD-10-CM | POA: Diagnosis not present

## 2022-04-13 DIAGNOSIS — R109 Unspecified abdominal pain: Secondary | ICD-10-CM | POA: Diagnosis not present

## 2022-04-13 DIAGNOSIS — K3184 Gastroparesis: Secondary | ICD-10-CM | POA: Diagnosis not present

## 2022-04-13 DIAGNOSIS — Z79899 Other long term (current) drug therapy: Secondary | ICD-10-CM | POA: Diagnosis not present

## 2022-04-13 DIAGNOSIS — E1143 Type 2 diabetes mellitus with diabetic autonomic (poly)neuropathy: Secondary | ICD-10-CM | POA: Diagnosis not present

## 2022-04-13 DIAGNOSIS — Z1152 Encounter for screening for COVID-19: Secondary | ICD-10-CM | POA: Diagnosis not present

## 2022-04-13 LAB — BASIC METABOLIC PANEL
Anion gap: 7 (ref 5–15)
BUN: 35 mg/dL — ABNORMAL HIGH (ref 8–23)
CO2: 24 mmol/L (ref 22–32)
Calcium: 9.9 mg/dL (ref 8.9–10.3)
Chloride: 108 mmol/L (ref 98–111)
Creatinine, Ser: 1.79 mg/dL — ABNORMAL HIGH (ref 0.44–1.00)
GFR, Estimated: 29 mL/min — ABNORMAL LOW (ref >60)
Glucose, Bld: 151 mg/dL — ABNORMAL HIGH (ref 70–99)
Potassium: 4.5 mmol/L (ref 3.5–5.1)
Sodium: 139 mmol/L (ref 135–145)

## 2022-04-13 LAB — GLUCOSE, CAPILLARY
Glucose-Capillary: 181 mg/dL — ABNORMAL HIGH (ref 70–99)
Glucose-Capillary: 183 mg/dL — ABNORMAL HIGH (ref 70–99)

## 2022-04-13 LAB — CBG MONITORING, ED
Glucose-Capillary: 148 mg/dL — ABNORMAL HIGH (ref 70–99)
Glucose-Capillary: 205 mg/dL — ABNORMAL HIGH (ref 70–99)

## 2022-04-13 MED ORDER — INSULIN GLARGINE-YFGN 100 UNIT/ML ~~LOC~~ SOLN
25.0000 [IU] | Freq: Every day | SUBCUTANEOUS | Status: DC
  Filled 2022-04-13: qty 0.25

## 2022-04-13 MED ORDER — CARVEDILOL 6.25 MG PO TABS
6.2500 mg | ORAL_TABLET | Freq: Two times a day (BID) | ORAL | Status: DC
  Administered 2022-04-13: 6.25 mg via ORAL
  Filled 2022-04-13: qty 2

## 2022-04-13 MED ORDER — METOCLOPRAMIDE HCL 5 MG/ML IJ SOLN
5.0000 mg | Freq: Four times a day (QID) | INTRAMUSCULAR | Status: DC
  Administered 2022-04-13 – 2022-04-17 (×15): 5 mg via INTRAVENOUS
  Filled 2022-04-13 (×15): qty 2

## 2022-04-13 MED ORDER — SACUBITRIL-VALSARTAN 97-103 MG PO TABS
0.5000 | ORAL_TABLET | Freq: Two times a day (BID) | ORAL | Status: DC
  Administered 2022-04-13 – 2022-04-17 (×9): 0.5 via ORAL
  Filled 2022-04-13 (×10): qty 0.5

## 2022-04-13 MED ORDER — INSULIN GLARGINE-YFGN 100 UNIT/ML ~~LOC~~ SOLN
20.0000 [IU] | Freq: Every day | SUBCUTANEOUS | Status: DC
  Administered 2022-04-13 – 2022-04-17 (×5): 20 [IU] via SUBCUTANEOUS
  Filled 2022-04-13 (×5): qty 0.2

## 2022-04-13 MED ORDER — CARVEDILOL 3.125 MG PO TABS: 3.1250 mg | ORAL_TABLET | Freq: Two times a day (BID) | ORAL | Status: DC

## 2022-04-13 NOTE — Progress Notes (Addendum)
PROGRESS NOTE    Cassandra Adams  PZW:[id number] DOB: Jun 26, 1946 DOA: 04/11/2022 PCP: Sharilyn Sites, MD   Chief Complaint  Patient presents with   Hyperglycemia   Emesis    Brief Narrative:    Cassandra Adams is a 76 y.o. female with medical history significant of DM2, CAD, TIA, PAF on eliquis and amiodarone, HFpEF, CKD 3B. Pt in to ED 4 days ago with hyperglycemia.  Well she developed abdominal pain, nausea and vomiting which was constant over last few days, has diarrhea, constipation, CBG was significantly elevated since Thursday, so she presents to ED for further management, work-up was significant for worsening renal failure, hyperglycemia, so she is admitted for further work-up.    Assessment & Plan:   Principal Problem:   Hyperglycemia due to type 2 diabetes mellitus ([place]) Active Problems:   AKI (acute kidney injury) ([place])   Stage 3b chronic kidney disease (CKD) (HCC)   Nausea & vomiting   Atrial fibrillation, chronic (HCC)   Chronic diastolic CHF (congestive heart failure) ([place])   Hyperglycemia due to type 2 diabetes mellitus ([place]) - Severe Hyperglycemia despite taking home insulin.  Is most likely stress related, with her significant nausea and vomiting -DKA, but given unreliable oral intake and labile/high CBGs she was on insulin drip initially, she is currently transitioned to [facility] and insulin sliding scale . -Remains with very poor oral intake today, still complaining of nausea, but vomiting and abdominal discomfort for oral intake, will start on scheduled Reglan.  -CBG better controlled, but remains elevated, will increase Semglee to 20 units, continue with insulin sliding scale . -Continue to hold Jardiance and resume when more stable .  AKI on CKD 3B  -Baseline creatinine around 2, creatinine peaked at 2.78 on admission, this has improved with IV fluids. -Resume back on Entresto and Jardiance today. -Continue to hold  diuretics.  Nausea/vomiting/abdominal pain -Most likely due to underlying gastroparesis, discussed with the patient about lower portions, more frequent meals, will consult dietitian for further counseling -She remains on as needed Zofran, will add scheduled IV Reglan.   Chronic diastolic CHF (congestive heart failure) (HCC) -Volume status has improved . -Resume Entresto, continue to hold Lasix, Aldactone and Imdur .  Atrial fibrillation, chronic (HCC) -Continue with amiodarone and Eliquis -We will resume Coreg now blood pressure has improved   DVT prophylaxis: Eliquis Code Status: Full Family Communication: none at bedside Disposition:   Patient will need further hospitalization as still with poor oral intake, persistent nausea, poor oral intake due to gastroparesis.    Consultants:  None   Subjective:  Patient still reports some nausea, oral intake remains poor due to nausea and some epigastric pain .   Objective: Vitals:   04/13/22 0307 04/13/22 0701 04/13/22 0713 04/13/22 1000  BP: (!) 154/56 (!) 148/52  (!) 111/49  Pulse: (!) 51 (!) 49  (!) 45  Resp: '16 17  12  '$ Temp:   98 F (36.7 C)   TempSrc:   Oral   SpO2: 98% 96%  95%  Weight:      Height:       No intake or output data in the 24 hours ending 04/13/22 1034  Filed Weights   04/11/22 1712  Weight: 72.6 kg    Examination:  Awake Alert, Oriented X 3, No new F.N deficits, frail Symmetrical Chest wall movement, Good air movement bilaterally, CTAB RRR,No Gallops,Rubs or new Murmurs, No Parasternal Heave +ve B.Sounds, Abd Soft, mild epigastric tenderness to palpation no  Cyanosis, Clubbing or edema, No new Rash or bruise        Data Reviewed: I have personally reviewed following labs and imaging studies  CBC: Recent Labs  Lab 04/07/22 1938 04/07/22 1947 04/11/22 1246 04/11/22 1544  WBC 5.0  --  4.6  --   NEUTROABS 3.7  --   --   --   HGB 13.1 12.6 13.2 11.9*  HCT 36.4 37.0 37.9 35.0*  MCV  80.4  --  81.9  --   PLT 162  --  141*  --     Basic Metabolic Panel: Recent Labs  Lab 04/07/22 1938 04/07/22 1947 04/11/22 1246 04/11/22 1544 04/12/22 0256 04/13/22 0300  NA 138 138 141 135 142 139  K 4.9 4.9 5.0 5.0 3.9 4.5  CL 103  --  106  --  109 108  CO2 24  --  21*  --  26 24  GLUCOSE 617*  --  617*  --  174* 151*  BUN 58*  --  63*  --  51* 35*  CREATININE 2.83*  --  2.85*  --  2.38* 1.79*  CALCIUM 11.2*  --  10.7*  --  10.5* 9.9    GFR: Estimated Creatinine Clearance: 26.8 mL/min (A) (by C-G formula based on SCr of 1.79 mg/dL (H)).  Liver Function Tests: Recent Labs  Lab 04/07/22 1938 04/11/22 1246  AST 27 20  ALT 24 20  ALKPHOS 77 60  BILITOT 0.5 0.5  PROT 6.8 6.7  ALBUMIN 3.7 3.4*    CBG: Recent Labs  Lab 04/12/22 0809 04/12/22 1157 04/12/22 1616 04/12/22 2130 04/13/22 0730  GLUCAP 108* 184* 259* 249* 148*     Recent Results (from the past 240 hour(s))  Resp Panel by RT-PCR (Flu A&B, Covid) Anterior Nasal Swab     Status: None   Collection Time: 04/11/22  3:26 PM   Specimen: Anterior Nasal Swab  Result Value Ref Range Status   SARS Coronavirus 2 by RT PCR NEGATIVE NEGATIVE Final    Comment: (NOTE) SARS-CoV-2 target nucleic acids are NOT DETECTED.  The SARS-CoV-2 RNA is generally detectable in upper respiratory specimens during the acute phase of infection. The lowest concentration of SARS-CoV-2 viral copies this assay can detect is 138 copies/mL. A negative result does not preclude SARS-Cov-2 infection and should not be used as the sole basis for treatment or other patient management decisions. A negative result may occur with  improper specimen collection/handling, submission of specimen other than nasopharyngeal swab, presence of viral mutation(s) within the areas targeted by this assay, and inadequate number of viral copies(<138 copies/mL). A negative result must be combined with clinical observations, patient history, and  epidemiological information. The expected result is Negative.  Fact Sheet for Patients:  EntrepreneurPulse.com.au  Fact Sheet for Healthcare Providers:  IncredibleEmployment.be  This test is no t yet approved or cleared by the [place] FDA and  has been authorized for detection and/or diagnosis of SARS-CoV-2 by FDA under an Emergency Use Authorization (EUA). This EUA will remain  in effect (meaning this test can be used) for the duration of the COVID-19 declaration under Section 564(b)(1) of the Act, 21 U.S.C.section 360bbb-3(b)(1), unless the authorization is terminated  or revoked sooner.       Influenza A by PCR NEGATIVE NEGATIVE Final   Influenza B by PCR NEGATIVE NEGATIVE Final    Comment: (NOTE) The Xpert Xpress SARS-CoV-2/FLU/RSV plus assay is intended as an aid in the diagnosis of influenza from Nasopharyngeal  swab specimens and should not be used as a sole basis for treatment. Nasal washings and aspirates are unacceptable for Xpert Xpress SARS-CoV-2/FLU/RSV testing.  Fact Sheet for Patients: EntrepreneurPulse.com.au  Fact Sheet for Healthcare Providers: IncredibleEmployment.be  This test is not yet approved or cleared by the [place] FDA and has been authorized for detection and/or diagnosis of SARS-CoV-2 by FDA under an Emergency Use Authorization (EUA). This EUA will remain in effect (meaning this test can be used) for the duration of the COVID-19 declaration under Section 564(b)(1) of the Act, 21 U.S.C. section 360bbb-3(b)(1), unless the authorization is terminated or revoked.  Performed at [facility], [place] [address], [place], [place] [address]   Group A Strep by PCR     Status: None   Collection Time: 04/11/22  3:26 PM   Specimen: Anterior Nasal Swab; Sterile Swab  Result Value Ref Range Status   Group A Strep by PCR NOT DETECTED NOT DETECTED Final    Comment:  Performed at [facility], [place] [address] [place], [place] [address]         Radiology Studies: CT ABDOMEN PELVIS WO CONTRAST  Result Date: 04/11/2022 CLINICAL DATA:  Left lower quadrant abdominal pain EXAM: CT ABDOMEN AND PELVIS WITHOUT CONTRAST TECHNIQUE: Multidetector CT imaging of the abdomen and pelvis was performed following the standard protocol without IV contrast. RADIATION DOSE REDUCTION: This exam was performed according to the departmental dose-optimization program which includes automated exposure control, adjustment of the mA and/or kV according to patient size and/or use of iterative reconstruction technique. COMPARISON:  CT abdomen and pelvis 09/14/2021 FINDINGS: Lower chest: No acute abnormality. Hepatobiliary: No focal liver abnormality is seen. No gallstones, gallbladder wall thickening, or biliary dilatation. Pancreas: Unremarkable. Spleen: Unremarkable. Adrenals/Urinary Tract: Unremarkable adrenal glands. Unremarkable noncontrast appearance of the kidneys. No urinary calculi or hydronephrosis. Unremarkable bladder. Stomach/Bowel: Unremarkable stomach. Normal caliber large and small bowel. Moderate colonic stool burden. Vascular/Lymphatic: Aortic atherosclerosis. No enlarged abdominal or pelvic lymph nodes. Reproductive: Calcified fibroid in the uterus. No suspicious adnexal mass. Other: No free intraperitoneal fluid or air. Musculoskeletal: No acute osseous abnormality. Thoracolumbar spondylosis. Body wall edema. Small fat containing umbilical hernia. Small fat containing bilateral inguinal hernias. Coarse calcifications both breasts. IMPRESSION: No acute findings in the abdomen or pelvis. Aortic Atherosclerosis (ICD10-I70.0). Electronically Signed   By: Placido Sou M.D.   On: 04/11/2022 18:59        Scheduled Meds:  amiodarone  200 mg Oral Daily   apixaban  5 mg Oral BID   atorvastatin  80 mg Oral QPM   carvedilol  6.25 mg Oral BID   clopidogrel  75 mg Oral  Daily   insulin aspart  0-15 Units Subcutaneous TID WC   insulin aspart  0-5 Units Subcutaneous QHS   insulin glargine-yfgn  20 Units Subcutaneous Daily   metoCLOPramide (REGLAN) injection  5 mg Intravenous Q6H   sacubitril-valsartan  0.5 tablet Oral BID   Continuous Infusions:     LOS: 0 days      Phillips Climes, MD [facility]   To contact the attending provider between 7A-7P or the covering provider during after hours 7P-7A, please log into the web site [email or web address] and access using universal [place] password for that web site. If you do not have the password, please call the hospital operator.  04/13/2022, 10:34 AM

## 2022-04-13 NOTE — Evaluation (Signed)
Physical Therapy Evaluation Patient Details Name: Cassandra Adams MRN: [id number] DOB: [birth year] Today's Date: 04/13/2022  History of Present Illness  76 yo female with onset of elevated BS was admitted on 10/17, referred to PT to ck  movement.  PMHx:  atherosclerosis, CHF, a-fib, CKD 3b  Clinical Impression  Pt was seen for standing and strength  testing but declining to walk over her N&V.  Pt is otherwise good with movement, home alone and able to maneuver a RW.  Her current functional level is close to baseline, and feel the nausea is her main limitation.  Follow along with her to get  moving and progress to independence with RW, and may be able to set aside the need for HHPT if she is that independent.  Follow acutely for goals as are outlined below.     Recommendations for follow up therapy are one component of a multi-disciplinary discharge planning process, led by the attending physician.  Recommendations may be updated based on patient status, additional functional criteria and insurance authorization.  Follow Up Recommendations Home health PT      Assistance Recommended at Discharge Intermittent Supervision/Assistance  Patient can return home with the following  A little help with walking and/or transfers;Assistance with cooking/housework;Assist for transportation    Equipment Recommendations None recommended by PT  Recommendations for Other Services       Functional Status Assessment Patient has had a recent decline in their functional status and demonstrates the ability to make significant improvements in function in a reasonable and predictable amount of time.     Precautions / Restrictions Precautions Precautions: Fall Precaution Comments: monitor vitals Restrictions Weight Bearing Restrictions: No      Mobility  Bed Mobility Overal bed mobility: Needs Assistance Bed Mobility: Supine to Sit, Sit to Supine     Supine to sit: Min assist Sit to supine: Min  assist   General bed mobility comments: min assist as pt is nauseated and then back to bed for legs due to vomiting    Transfers Overall transfer level: Modified independent                 General transfer comment: stood at side of bed with no AD    Ambulation/Gait Ambulation/Gait assistance: Min guard Gait Distance (Feet): 4 Feet Assistive device: 1 person hand held assist   Gait velocity: reduced Gait velocity interpretation: <1.31 ft/sec, indicative of household ambulator Pre-gait activities: standing ck was minimal due to nausea General Gait Details: brief sidesteps as pt was vomiting just prior  Stairs            Wheelchair Mobility    Modified Rankin (Stroke Patients Only)       Balance Overall balance assessment: Needs assistance Sitting-balance support: Feet supported Sitting balance-Leahy Scale: Good     Standing balance support: During functional activity, Single extremity supported Standing balance-Leahy Scale: Fair                               Pertinent Vitals/Pain Pain Assessment Pain Assessment: No/denies pain    Home Living Family/patient expects to be discharged to:: Private residence Living Arrangements: Alone Available Help at Discharge: Family;Friend(s);Available PRN/intermittently Type of Home: Apartment Home Access: Level entry       Home Layout: One level Home Equipment: Conservation officer, nature (2 wheels);Cane - single point;BSC/3in1;Shower seat Additional Comments: has never been a driver, family assists    Prior Function Prior Level  of Function : Independent/Modified Independent             Mobility Comments: household and short distanced community ambulator using SPC or RW       Hand Dominance   Dominant Hand: Right    Extremity/Trunk Assessment   Upper Extremity Assessment Upper Extremity Assessment: Overall WFL for tasks assessed    Lower Extremity Assessment Lower Extremity Assessment:  Generalized weakness    Cervical / Trunk Assessment Cervical / Trunk Assessment: Normal  Communication   Communication: No difficulties  Cognition Arousal/Alertness: Awake/alert Behavior During Therapy: WFL for tasks assessed/performed Overall Cognitive Status: Within Functional Limits for tasks assessed                                          General Comments General comments (skin integrity, edema, etc.): pt is too nauseated to move much but can clearly walk with minimal intervention just even with HHA    Exercises     Assessment/Plan    PT Assessment Patient needs continued PT services  PT Problem List Decreased activity tolerance;Decreased balance       PT Treatment Interventions DME instruction;Gait training;Functional mobility training;Therapeutic activities;Therapeutic exercise;Balance training;Neuromuscular re-education;Patient/family education    PT Goals (Current goals can be found in the Care Plan section)  Acute Rehab PT Goals Patient Stated Goal: to walk and get home PT Goal Formulation: With patient Time For Goal Achievement: 04/20/22 Potential to Achieve Goals: Good    Frequency Min 3X/week     Co-evaluation               AM-PAC PT "6 Clicks" Mobility  Outcome Measure Help needed turning from your back to your side while in a flat bed without using bedrails?: None Help needed moving from lying on your back to sitting on the side of a flat bed without using bedrails?: A Little Help needed moving to and from a bed to a chair (including a wheelchair)?: A Little Help needed standing up from a chair using your arms (e.g., wheelchair or bedside chair)?: A Little Help needed to walk in hospital room?: A Little Help needed climbing 3-5 steps with a railing? : A Little 6 Click Score: 19    End of Session   Activity Tolerance: Treatment limited secondary to medical complications (Comment) Patient left: in bed;with call bell/phone  within reach Nurse Communication: Mobility status PT Visit Diagnosis: Unsteadiness on feet (R26.81)    Time: 2831-5176 PT Time Calculation (min) (ACUTE ONLY): 21 min   Charges:   PT Evaluation $PT Eval Moderate Complexity: 1 Mod         Ramond Dial 04/13/2022, 5:07 PM  Mee Hives, PT PhD Acute Rehab Dept. Number: [place] and [place]

## 2022-04-13 NOTE — Telephone Encounter (Signed)
Left VM for patient's daughter, she needs a f/u with [facility]. I have her new updated information on paper in referral folder

## 2022-04-14 DIAGNOSIS — E1165 Type 2 diabetes mellitus with hyperglycemia: Secondary | ICD-10-CM | POA: Diagnosis not present

## 2022-04-14 DIAGNOSIS — L8990 Pressure ulcer of unspecified site, unspecified stage: Secondary | ICD-10-CM | POA: Insufficient documentation

## 2022-04-14 DIAGNOSIS — K3184 Gastroparesis: Secondary | ICD-10-CM

## 2022-04-14 DIAGNOSIS — N179 Acute kidney failure, unspecified: Secondary | ICD-10-CM | POA: Diagnosis not present

## 2022-04-14 DIAGNOSIS — R109 Unspecified abdominal pain: Secondary | ICD-10-CM | POA: Diagnosis not present

## 2022-04-14 DIAGNOSIS — R112 Nausea with vomiting, unspecified: Secondary | ICD-10-CM | POA: Diagnosis not present

## 2022-04-14 LAB — BASIC METABOLIC PANEL
Anion gap: 7 (ref 5–15)
BUN: 26 mg/dL — ABNORMAL HIGH (ref 8–23)
CO2: 24 mmol/L (ref 22–32)
Calcium: 9.8 mg/dL (ref 8.9–10.3)
Chloride: 108 mmol/L (ref 98–111)
Creatinine, Ser: 1.63 mg/dL — ABNORMAL HIGH (ref 0.44–1.00)
GFR, Estimated: 32 mL/min — ABNORMAL LOW (ref >60)
Glucose, Bld: 142 mg/dL — ABNORMAL HIGH (ref 70–99)
Potassium: 4.3 mmol/L (ref 3.5–5.1)
Sodium: 139 mmol/L (ref 135–145)

## 2022-04-14 LAB — GLUCOSE, CAPILLARY
Glucose-Capillary: 214 mg/dL — ABNORMAL HIGH (ref 70–99)
Glucose-Capillary: 194 mg/dL — ABNORMAL HIGH (ref 70–99)
Glucose-Capillary: 180 mg/dL — ABNORMAL HIGH (ref 70–99)
Glucose-Capillary: 163 mg/dL — ABNORMAL HIGH (ref 70–99)
Glucose-Capillary: 206 mg/dL — ABNORMAL HIGH (ref 70–99)

## 2022-04-14 MED ORDER — GLUCERNA SHAKE PO LIQD
237.0000 mL | Freq: Three times a day (TID) | ORAL | Status: DC
  Administered 2022-04-15 – 2022-04-17 (×7): 237 mL via ORAL

## 2022-04-14 MED ORDER — ADULT MULTIVITAMIN W/MINERALS CH
1.0000 | ORAL_TABLET | Freq: Every day | ORAL | Status: DC
  Administered 2022-04-14 – 2022-04-17 (×4): 1 via ORAL
  Filled 2022-04-14 (×4): qty 1

## 2022-04-14 NOTE — Evaluation (Signed)
Occupational Therapy Evaluation Patient Details Name: Cassandra Adams MRN: [id number] DOB: May 01, 1946 Today's Date: 04/14/2022   History of Present Illness 76 yo female with onset of elevated BS was admitted on 10/17, referred to PT to ck  movement.  PMHx:  atherosclerosis, CHF, a-fib, CKD 3b   Clinical Impression   Patient admitted for the diagnosis above.  PTA she lives alone in a one level apartment, with daily check-ins and assist as needed from her daughter.  Deficits are persistent abdominal discomfort, impacting her ability to lean forward for socks and shoes, and mild dizziness. Patient continues to feel nauseous.  Currently she is needing generalized supervision for mobility, and Min A for lower body ADL.  OT will continue efforts in the acute setting, but she should be able to transition directly home with potentially 24 hour supervision from family initially.        Recommendations for follow up therapy are one component of a multi-disciplinary discharge planning process, led by the attending physician.  Recommendations may be updated based on patient status, additional functional criteria and insurance authorization.   Follow Up Recommendations  No OT follow up    Assistance Recommended at Discharge Intermittent Supervision/Assistance  Patient can return home with the following Assist for transportation;Assistance with cooking/housework    Functional Status Assessment  Patient has had a recent decline in their functional status and demonstrates the ability to make significant improvements in function in a reasonable and predictable amount of time.  Equipment Recommendations  None recommended by OT    Recommendations for Other Services       Precautions / Restrictions Precautions Precautions: Fall Restrictions Weight Bearing Restrictions: No      Mobility Bed Mobility                    Transfers Overall transfer level: Needs assistance Equipment  used: Rolling walker (2 wheels) Transfers: Sit to/from Stand, Bed to chair/wheelchair/BSC Sit to Stand: Supervision     Step pivot transfers: Supervision     General transfer comment: mild dizziness and abdominal pain      Balance Overall balance assessment: Needs assistance Sitting-balance support: Feet supported Sitting balance-Leahy Scale: Good     Standing balance support: Reliant on assistive device for balance Standing balance-Leahy Scale: Fair                             ADL either performed or assessed with clinical judgement   ADL       Grooming: Wash/dry hands;Wash/dry face;Set up;Sitting   Upper Body Bathing: Set up;Sitting   Lower Body Bathing: Minimal assistance;Sit to/from stand   Upper Body Dressing : Set up;Sitting   Lower Body Dressing: Minimal assistance;Sit to/from stand   Toilet Transfer: Supervision/safety;Rolling walker (2 wheels)   Toileting- Clothing Manipulation and Hygiene: Independent;Sitting/lateral lean               Vision Patient Visual Report: No change from baseline       Perception Perception Perception: Within Functional Limits   Praxis Praxis Praxis: Intact    Pertinent Vitals/Pain Pain Assessment Pain Assessment: Faces Faces Pain Scale: Hurts little more Pain Location: abdomen Pain Descriptors / Indicators: Pressure, Grimacing Pain Intervention(s): Monitored during session     Hand Dominance Right   Extremity/Trunk Assessment Upper Extremity Assessment Upper Extremity Assessment: Overall WFL for tasks assessed   Lower Extremity Assessment Lower Extremity Assessment: Defer to PT evaluation  Cervical / Trunk Assessment Cervical / Trunk Assessment: Normal   Communication Communication Communication: No difficulties   Cognition Arousal/Alertness: Awake/alert Behavior During Therapy: WFL for tasks assessed/performed Overall Cognitive Status: Within Functional Limits for tasks assessed                                        General Comments   VSS on RA    Exercises     Shoulder Instructions      Home Living Family/patient expects to be discharged to:: Private residence Living Arrangements: Alone Available Help at Discharge: Family;Friend(s);Available PRN/intermittently Type of Home: Apartment Home Access: Level entry     Home Layout: One level     Bathroom Shower/Tub: Teacher, early years/pre: Standard Bathroom Accessibility: Yes   Home Equipment: Conservation officer, nature (2 wheels);Cane - single point;BSC/3in1;Shower seat          Prior Functioning/Environment Prior Level of Function : Independent/Modified Independent             Mobility Comments: household and short distanced community ambulator using SPC or RW ADLs Comments: Cares for her own ADL and iADL, daughter is there to provide assist as needed.  No assist with meds.        OT Problem List: Decreased activity tolerance;Impaired balance (sitting and/or standing)      OT Treatment/Interventions: Self-care/ADL training;Therapeutic activities;Patient/family education;Balance training    OT Goals(Current goals can be found in the care plan section) Acute Rehab OT Goals Patient Stated Goal: Return home OT Goal Formulation: With patient Time For Goal Achievement: 04/28/22 Potential to Achieve Goals: Good  OT Frequency: Min 2X/week    Co-evaluation              AM-PAC OT "6 Clicks" Daily Activity     Outcome Measure Help from another person eating meals?: None Help from another person taking care of personal grooming?: None Help from another person toileting, which includes using toliet, bedpan, or urinal?: A Little Help from another person bathing (including washing, rinsing, drying)?: A Little Help from another person to put on and taking off regular upper body clothing?: None Help from another person to put on and taking off regular lower body clothing?: A  Little 6 Click Score: 21   End of Session Equipment Utilized During Treatment: Rolling walker (2 wheels) Nurse Communication: Mobility status  Activity Tolerance: Patient tolerated treatment well Patient left: in chair;with call bell/phone within reach  OT Visit Diagnosis: Unsteadiness on feet (R26.81)                Time: 5409-8119 OT Time Calculation (min): 20 min Charges:  OT General Charges $OT Visit: 1 Visit OT Evaluation $OT Eval Moderate Complexity: 1 Mod  04/14/2022  RP, OTR/L  Acute Rehabilitation Services  Office:  [phone]   Metta Clines 04/14/2022, 11:46 AM

## 2022-04-14 NOTE — TOC Initial Note (Addendum)
Transition of Care [facility]) - Initial/Assessment Note    Patient Details  Name: Cassandra Adams MRN: [id number] Date of Birth: 03-Jul-1945  Transition of Care [facility]) CM/SW Contact:    Verdell Carmine, RN Phone Number: 04/14/2022, 9:01 AM  Clinical Narrative:                  76 year old presents with gastrointestinal distress. She lives at home in a apartment by herself with daughter frequently coming over for support.  Home health explained, currently she denies the need for [facility]. Left her with Medicare list for agencies in [place] to look over in case she does want to accept home health prior to DC. It is also on the shadow chart.  She has a cane and walker at home.  CM will follow for needs, recommendations, and transitions of care Expected Discharge Plan: Home/Self Care Barriers to Discharge: Continued Medical Work up   Patient Goals and CMS Choice     Choice offered to / list presented to : Patient  Expected Discharge Plan and Services Expected Discharge Plan: Home/Self Care   Discharge Planning Services: CM Consult   Living arrangements for the past 2 months: Apartment                                      Prior Living Arrangements/Services Living arrangements for the past 2 months: Apartment Lives with:: Self Patient language and need for interpreter reviewed:: Yes Do you feel safe going back to the place where you live?: Yes      Need for Family Participation in Patient Care: Yes (Comment) Care giver support system in place?: Yes (comment) Current home services: DME Criminal Activity/Legal Involvement Pertinent to Current Situation/Hospitalization: No - Comment as needed  Activities of Daily Living      Permission Sought/Granted                  Emotional Assessment Appearance:: Appears stated age Attitude/Demeanor/Rapport: Guarded Affect (typically observed): Calm Orientation: : Oriented to Self, Oriented to Place, Oriented to  Time,  Oriented to Situation Alcohol / Substance Use: Not Applicable Psych Involvement: No (comment)  Admission diagnosis:  Hyperglycemia [R73.9] Abdominal pain, unspecified abdominal location [R10.9] Hyperglycemia due to type 2 diabetes mellitus ([place]) [E11.65] Nausea and vomiting, unspecified vomiting type [R11.2] Patient Active Problem List   Diagnosis Date Noted   Pressure injury of skin 04/14/2022   Hyperglycemia due to type 2 diabetes mellitus ([place]) 04/11/2022   Nausea & vomiting 04/11/2022   Severe nonproliferative diabetic retinopathy of right eye without macular edema associated with type 2 diabetes mellitus ([place]) 02/06/2022   Severe nonproliferative diabetic retinopathy of left eye, with macular edema, associated with type 2 diabetes mellitus ([place]) 02/06/2022   A-fib ([place]) 12/10/2021   Demand ischemia    Mixed hyperlipidemia 09/15/2021   Diabetic foot ulcer ([place]) 09/15/2021   Chronic diastolic CHF (congestive heart failure) ([place]) 09/14/2021   NSTEMI (non-ST elevated myocardial infarction) ([place]) 08/02/2021   Sepsis ([place]) 06/11/2021   Secondary hypercoagulable state ([place]) 01/25/2021   Acute pulmonary edema (HCC)    Acute CHF (congestive heart failure) ([place]) 01/16/2021   Acute respiratory distress 01/16/2021   Microcytic anemia 01/16/2021   Hypertensive urgency 01/16/2021   Acute on chronic diastolic (congestive) heart failure ([employer]) 01/16/2021   Persistent atrial fibrillation ([place])    Gastric nodule    Acute blood loss anemia 11/07/2020  Hematemesis with nausea 11/07/2020   Atrial fibrillation, chronic ([place]) 11/07/2020   Transient hypotension 11/07/2020   Atypical chest pain 11/07/2020   Elevated troponin 11/07/2020   Syncope and collapse 11/05/2020   CAD (coronary artery disease)    Epistaxis due to trauma    Benign paroxysmal positional vertigo 05/11/2019   Chronic headaches 05/11/2019   Atrial fibrillation with RVR ([place])    Right hemiparesis ([place]) 05/05/2019    Uncontrolled type 2 diabetes mellitus with hyperglycemia ([place]) 05/05/2019   Stage 3b chronic kidney disease (CKD) ([place]) 05/05/2019   Left-sided weakness 05/04/2019   Aphasia 02/26/2019   DM type 2 causing vascular disease ([place]) 06/11/2017   Type 2 diabetes mellitus with stage 3a chronic kidney disease, with long-term current use of insulin ([place]) 06/11/2017   Carotid stenosis 02/05/2017   TIA (transient ischemic attack) 01/20/2017   Carotid stenosis, right 01/20/2017   Diabetic hyperosmolar non-ketotic state ([place]) 01/19/2017   AKI (acute kidney injury) ([place]) 01/19/2017   Left leg weakness 01/19/2017   Dyslipidemia 11/24/2016   Diaphoresis 02/18/2016   Hypoglycemia 02/18/2016   Bradycardia 04/22/2015   Coronary artery disease due to lipid rich plaque 02/26/2014   PAF (paroxysmal atrial fibrillation) ([place]) 02/26/2014   Occlusion and stenosis of carotid artery without mention of cerebral infarction 11/21/2013   Pain in limb-Left neck 11/21/2013   Carotid stenosis, bilateral 11/17/2011   Subclinical hyperthyroidism 02/15/2010   GERD 02/15/2010   DYSPHAGIA UNSPECIFIED 02/15/2010   Essential hypertension 02/11/2010   PCP:  Sharilyn Sites, MD Pharmacy:   [id number] [place] [id number]: [phone]: [phone]    Social Determinants of Health (SDOH) Interventions    Readmission Risk Interventions    12/12/2021    4:10 PM 01/18/2021    2:41 PM 01/18/2021    2:35 PM  Readmission Risk Prevention Plan  Transportation Screening Complete Complete Complete  PCP or Specialist Appt within 3-5 Days Complete Complete   HRI or Home Care Consult Complete Complete Complete  Social Work Consult for [place] Complete Complete   Palliative Care Screening Not Applicable Not Applicable   Medication Review (RN Care Manager) Referral to Pharmacy Complete

## 2022-04-14 NOTE — Progress Notes (Signed)
Initial Nutrition Assessment  DOCUMENTATION CODES:   Not applicable  INTERVENTION:  - Add Glucerna Shake po TID, each supplement provides 220 kcal and 10 grams of protein   - Add MVI q day.   NUTRITION DIAGNOSIS:   Inadequate oral intake related to nausea, vomiting as evidenced by energy intake < or equal to 50% for > or equal to 5 days.  GOAL:   Patient will meet greater than or equal to 90% of their needs  MONITOR:   PO intake, Supplement acceptance  REASON FOR ASSESSMENT:   Consult Diet education  ASSESSMENT:   76 y.o. female admits related to hyperglycemia and emesis. PMH includes: T2DM, CAD, TIA, PAF, HFpEF, CKD3.  Meds include:  sliding scale insulin, semglee (20 units), reglan. Labs reviewed.   Pt reports that she has not been eating well since last Thursday because nothing will stay down. MD consult for gastroparesis diet education. Pt is unsure of any wt loss. Wts fairly stable per record. Pt agreed to try Glucerna with meals.   RD was able to provide diet education on gastroparesis and DM diet. Discussed importance of controlled and consistent carbohydrate intake throughout the day. Provided examples of ways to balance meals/snacks and encouraged intake of high-fiber, whole grain complex carbohydrates. Also encouraged the pt to eat 4-6 small meals a day and limit fat intake. Teach back method used.  NUTRITION - FOCUSED PHYSICAL EXAM:  Flowsheet Row Most Recent Value  Orbital Region No depletion  Upper Arm Region No depletion  Thoracic and Lumbar Region No depletion  Buccal Region Mild depletion  [place] Region Mild depletion  Clavicle Bone Region Mild depletion  Clavicle and Acromion Bone Region No depletion  Scapular Bone Region No depletion  Dorsal Hand Mild depletion  Patellar Region No depletion  Anterior Thigh Region No depletion  Posterior Calf Region No depletion  Edema (RD Assessment) None  Hair Reviewed  Eyes Reviewed  Mouth Reviewed  Skin  Reviewed  Nails Reviewed       Diet Order:   Diet Order             Diet Carb Modified Fluid consistency: Thin; Room service appropriate? Yes  Diet effective now                   EDUCATION NEEDS:   Education needs have been addressed  Skin:     Last BM:  04/11/22  Height:   Ht Readings from Last 1 Encounters:  04/11/22 '5\' 6"'$  (1.676 m)    Weight:   Wt Readings from Last 1 Encounters:  04/11/22 72.6 kg    Ideal Body Weight:     BMI:  Body mass index is 25.82 kg/m.  Estimated Nutritional Needs:   Kcal:  1610-9604 kcals  Protein:  90-105 gm  Fluid:  1815-2175 mL  Thalia Bloodgood, RD, LDN, CNSC

## 2022-04-14 NOTE — Progress Notes (Addendum)
PROGRESS NOTE    Cassandra Adams  CNO:[id number] DOB: 1945-09-29 DOA: 04/11/2022 PCP: Sharilyn Sites, MD   Chief Complaint  Patient presents with   Hyperglycemia   Emesis    Brief Narrative:    Cassandra Adams is a 76 y.o. female with medical history significant of DM2, CAD, TIA, PAF on eliquis and amiodarone, HFpEF, CKD 3B. Pt in to ED 4 days ago with hyperglycemia.  Well she developed abdominal pain, nausea and vomiting which was constant over last few days, has diarrhea, constipation, CBG was significantly elevated since Thursday, so she presents to ED for further management, work-up was significant for worsening renal failure, hyperglycemia, so she is admitted for further work-up.    Assessment & Plan:   Principal Problem:   Hyperglycemia due to type 2 diabetes mellitus ([place]) Active Problems:   AKI (acute kidney injury) ([place])   Stage 3b chronic kidney disease (CKD) (HCC)   Nausea & vomiting   Atrial fibrillation, chronic (HCC)   Chronic diastolic CHF (congestive heart failure) (HCC)   Pressure injury of skin   Hyperglycemia due to type 2 diabetes mellitus ([place]) - Severe Hyperglycemia despite taking home insulin.  Is most likely stress related, with her significant nausea and vomiting -DKA, but given unreliable oral intake and labile/high CBGs she was on insulin drip initially, she is currently transitioned to [facility] and insulin sliding scale . -CBG better controlled on current regimen, continue with Semglee and insulin sliding scale  -Continue to hold Jardiance and resume when more stable .  AKI on CKD 3B  -Baseline creatinine around 2, creatinine peaked at 2.78 on admission, this has improved with IV fluids. -Resume back on Entresto and Jardiance today. -Continue to hold diuretics.  Nausea/vomiting/abdominal pain Gastroparesis -Reports nausea, abdominal pain, but no further vomiting, oral intake remains poor though, I have discussed with her importance  about smaller portion of meals, more frequent meals, meanwhile we will continue with IV Reglan, if no improvement over next 24 hours may consider IV erythromycin.     Chronic diastolic CHF (congestive heart failure) (HCC) -Volume status has improved . -Resume Entresto, continue to hold Lasix, Aldactone and Imdur .  Atrial fibrillation, chronic (HCC) -Continue with amiodarone and Eliquis -We will resume Coreg now blood pressure has improved  Pressure ulcer Present on admission   Pressure Injury 04/13/22 Coccyx Mid Stage 2 -  Partial thickness loss of dermis presenting as a shallow open injury with a red, pink wound bed without slough. (Active)  04/13/22 1545  Location: Coccyx  Location Orientation: Mid  Staging: Stage 2 -  Partial thickness loss of dermis presenting as a shallow open injury with a red, pink wound bed without slough.  Wound Description (Comments):   Present on Admission: Yes      DVT prophylaxis: Eliquis Code Status: Full Family Communication: none at bedside Disposition:   Patient will need further hospitalization as still with poor oral intake, persistent nausea, poor oral intake due to gastroparesis.    Consultants:  None   Subjective:  Patient still reports some nausea, abdominal pain, poor appetite, I have discussed with her at length the importance about smaller portions more frequent meals due to gastroparesis.  Objective: Vitals:   04/14/22 0404 04/14/22 0700 04/14/22 1143 04/14/22 1437  BP: (!) 143/57 (!) 153/59 (!) 142/83 (!) 122/52  Pulse: (!) 45 (!) 49 (!) 53 63  Resp: '17 12 12 14  '$ Temp: 97.8 F (36.6 C) 98.1 F (36.7 C) 98.6 F (37  C) 98.6 F (37 C)  TempSrc: Oral Oral Oral Oral  SpO2: 95% 98% 98% 98%  Weight:      Height:        Intake/Output Summary (Last 24 hours) at 04/14/2022 1512 Last data filed at 04/14/2022 0600 Gross per 24 hour  Intake --  Output 700 ml  Net -700 ml    Filed Weights   04/11/22 1712  Weight: 72.6  kg    Examination:  Awake Alert, Oriented X 3, No new F.N deficits, Normal affect Symmetrical Chest wall movement, Good air movement bilaterally, CTAB RRR,No Gallops,Rubs or new Murmurs, No Parasternal Heave +ve B.Sounds, Abd Soft, mild epigastric tenderness No Cyanosis, Clubbing or edema, No new Rash or bruise        Data Reviewed: I have personally reviewed following labs and imaging studies  CBC: Recent Labs  Lab 04/07/22 1938 04/07/22 1947 04/11/22 1246 04/11/22 1544  WBC 5.0  --  4.6  --   NEUTROABS 3.7  --   --   --   HGB 13.1 12.6 13.2 11.9*  HCT 36.4 37.0 37.9 35.0*  MCV 80.4  --  81.9  --   PLT 162  --  141*  --     Basic Metabolic Panel: Recent Labs  Lab 04/07/22 1938 04/07/22 1947 04/11/22 1246 04/11/22 1544 04/12/22 0256 04/13/22 0300 04/14/22 0255  NA 138   < > 141 135 142 139 139  K 4.9   < > 5.0 5.0 3.9 4.5 4.3  CL 103  --  106  --  109 108 108  CO2 24  --  21*  --  '26 24 24  '$ GLUCOSE 617*  --  617*  --  174* 151* 142*  BUN 58*  --  63*  --  51* 35* 26*  CREATININE 2.83*  --  2.85*  --  2.38* 1.79* 1.63*  CALCIUM 11.2*  --  10.7*  --  10.5* 9.9 9.8   < > = values in this interval not displayed.    GFR: Estimated Creatinine Clearance: 29.5 mL/min (A) (by C-G formula based on SCr of 1.63 mg/dL (H)).  Liver Function Tests: Recent Labs  Lab 04/07/22 1938 04/11/22 1246  AST 27 20  ALT 24 20  ALKPHOS 77 60  BILITOT 0.5 0.5  PROT 6.8 6.7  ALBUMIN 3.7 3.4*    CBG: Recent Labs  Lab 04/13/22 1640 04/13/22 2150 04/14/22 0840 04/14/22 1221 04/14/22 1442  GLUCAP 181* 183* 214* 194* 180*     Recent Results (from the past 240 hour(s))  Resp Panel by RT-PCR (Flu A&B, Covid) Anterior Nasal Swab     Status: None   Collection Time: 04/11/22  3:26 PM   Specimen: Anterior Nasal Swab  Result Value Ref Range Status   SARS Coronavirus 2 by RT PCR NEGATIVE NEGATIVE Final    Comment: (NOTE) SARS-CoV-2 target nucleic acids are NOT  DETECTED.  The SARS-CoV-2 RNA is generally detectable in upper respiratory specimens during the acute phase of infection. The lowest concentration of SARS-CoV-2 viral copies this assay can detect is 138 copies/mL. A negative result does not preclude SARS-Cov-2 infection and should not be used as the sole basis for treatment or other patient management decisions. A negative result may occur with  improper specimen collection/handling, submission of specimen other than nasopharyngeal swab, presence of viral mutation(s) within the areas targeted by this assay, and inadequate number of viral copies(<138 copies/mL). A negative result must be combined with clinical observations,  patient history, and epidemiological information. The expected result is Negative.  Fact Sheet for Patients:  EntrepreneurPulse.com.au  Fact Sheet for Healthcare Providers:  IncredibleEmployment.be  This test is no t yet approved or cleared by the [place] FDA and  has been authorized for detection and/or diagnosis of SARS-CoV-2 by FDA under an Emergency Use Authorization (EUA). This EUA will remain  in effect (meaning this test can be used) for the duration of the COVID-19 declaration under Section 564(b)(1) of the Act, 21 U.S.C.section 360bbb-3(b)(1), unless the authorization is terminated  or revoked sooner.       Influenza A by PCR NEGATIVE NEGATIVE Final   Influenza B by PCR NEGATIVE NEGATIVE Final    Comment: (NOTE) The Xpert Xpress SARS-CoV-2/FLU/RSV plus assay is intended as an aid in the diagnosis of influenza from Nasopharyngeal swab specimens and should not be used as a sole basis for treatment. Nasal washings and aspirates are unacceptable for Xpert Xpress SARS-CoV-2/FLU/RSV testing.  Fact Sheet for Patients: EntrepreneurPulse.com.au  Fact Sheet for Healthcare Providers: IncredibleEmployment.be  This test is not yet  approved or cleared by the [place] FDA and has been authorized for detection and/or diagnosis of SARS-CoV-2 by FDA under an Emergency Use Authorization (EUA). This EUA will remain in effect (meaning this test can be used) for the duration of the COVID-19 declaration under Section 564(b)(1) of the Act, 21 U.S.C. section 360bbb-3(b)(1), unless the authorization is terminated or revoked.  Performed at [facility], [place] [address], [place], [place] [address]   Group A Strep by PCR     Status: None   Collection Time: 04/11/22  3:26 PM   Specimen: Anterior Nasal Swab; Sterile Swab  Result Value Ref Range Status   Group A Strep by PCR NOT DETECTED NOT DETECTED Final    Comment: Performed at [facility], [place] [address], [place], [place] [address]         Radiology Studies: No results found.      Scheduled Meds:  amiodarone  200 mg Oral Daily   apixaban  5 mg Oral BID   atorvastatin  80 mg Oral QPM   clopidogrel  75 mg Oral Daily   insulin aspart  0-15 Units Subcutaneous TID WC   insulin aspart  0-5 Units Subcutaneous QHS   insulin glargine-yfgn  20 Units Subcutaneous Daily   metoCLOPramide (REGLAN) injection  5 mg Intravenous Q6H   sacubitril-valsartan  0.5 tablet Oral BID   Continuous Infusions:     LOS: 1 day      Phillips Climes, MD [facility]   To contact the attending provider between 7A-7P or the covering provider during after hours 7P-7A, please log into the web site [email or web address] and access using universal [place] password for that web site. If you do not have the password, please call the hospital operator.  04/14/2022, 3:12 PM

## 2022-04-15 DIAGNOSIS — K3184 Gastroparesis: Secondary | ICD-10-CM | POA: Diagnosis not present

## 2022-04-15 DIAGNOSIS — E1165 Type 2 diabetes mellitus with hyperglycemia: Secondary | ICD-10-CM | POA: Diagnosis not present

## 2022-04-15 DIAGNOSIS — Z794 Long term (current) use of insulin: Secondary | ICD-10-CM | POA: Diagnosis not present

## 2022-04-15 DIAGNOSIS — R112 Nausea with vomiting, unspecified: Secondary | ICD-10-CM | POA: Diagnosis not present

## 2022-04-15 LAB — BASIC METABOLIC PANEL
Anion gap: 8 (ref 5–15)
BUN: 26 mg/dL — ABNORMAL HIGH (ref 8–23)
CO2: 23 mmol/L (ref 22–32)
Calcium: 9.9 mg/dL (ref 8.9–10.3)
Chloride: 107 mmol/L (ref 98–111)
Creatinine, Ser: 1.68 mg/dL — ABNORMAL HIGH (ref 0.44–1.00)
GFR, Estimated: 31 mL/min — ABNORMAL LOW (ref >60)
Glucose, Bld: 167 mg/dL — ABNORMAL HIGH (ref 70–99)
Potassium: 4.5 mmol/L (ref 3.5–5.1)
Sodium: 138 mmol/L (ref 135–145)

## 2022-04-15 LAB — GLUCOSE, CAPILLARY
Glucose-Capillary: 171 mg/dL — ABNORMAL HIGH (ref 70–99)
Glucose-Capillary: 283 mg/dL — ABNORMAL HIGH (ref 70–99)
Glucose-Capillary: 209 mg/dL — ABNORMAL HIGH (ref 70–99)
Glucose-Capillary: 259 mg/dL — ABNORMAL HIGH (ref 70–99)

## 2022-04-15 MED ORDER — SODIUM CHLORIDE 0.9 % IV SOLN
500.0000 mg | Freq: Three times a day (TID) | INTRAVENOUS | Status: DC
  Administered 2022-04-15 – 2022-04-17 (×5): 500 mg via INTRAVENOUS
  Filled 2022-04-15 (×9): qty 10

## 2022-04-15 NOTE — Progress Notes (Signed)
PROGRESS NOTE    Cassandra Adams  YSA:[id number] DOB: 1945-11-03 DOA: 04/11/2022 PCP: Sharilyn Sites, MD   Chief Complaint  Patient presents with   Hyperglycemia   Emesis    Brief Narrative:    Cassandra Adams is a 76 y.o. female with medical history significant of DM2, CAD, TIA, PAF on eliquis and amiodarone, HFpEF, CKD 3B. Pt in to ED 4 days ago with hyperglycemia.  Well she developed abdominal pain, nausea and vomiting which was constant over last few days, has diarrhea, constipation, CBG was significantly elevated since Thursday, so she presents to ED for further management, work-up was significant for worsening renal failure, hyperglycemia, so she is admitted for further work-up.    Assessment & Plan:   Principal Problem:   Hyperglycemia due to type 2 diabetes mellitus ([place]) Active Problems:   AKI (acute kidney injury) ([place])   Stage 3b chronic kidney disease (CKD) (HCC)   Nausea & vomiting   Atrial fibrillation, chronic (HCC)   Chronic diastolic CHF (congestive heart failure) (HCC)   Pressure injury of skin   Hyperglycemia due to type 2 diabetes mellitus ([place]) - Severe Hyperglycemia despite taking home insulin.  Is most likely stress related, with her significant nausea and vomiting -DKA, but given unreliable oral intake and labile/high CBGs she was on insulin drip initially, she is currently transitioned to [facility] and insulin sliding scale . -CBG better controlled on current regimen, continue with Semglee and insulin sliding scale  -Continue to hold Jardiance and resume when more stable .  AKI on CKD 3B  -Baseline creatinine around 2, creatinine peaked at 2.78 on admission, this has improved with IV fluids. -Resume back on Entresto and Jardiance today. -Continue to hold diuretics.  Nausea/vomiting/abdominal pain Gastroparesis -She still reports nausea, abdominal pain, but no vomiting, she is having poor oral intake despite being on scheduled IV Reglan,  she was counseled and educated again about smaller portion more frequent meals. -She remains symptomatic so we will start  IV erythromycin   Chronic diastolic CHF (congestive heart failure) ([place]) -Volume status has improved . -Resume Entresto, continue to hold Lasix, Aldactone and Imdur .  Atrial fibrillation, chronic (HCC) -Continue with amiodarone and Eliquis -We will resume Coreg now blood pressure has improved  Pressure ulcer Present on admission   Pressure Injury 04/13/22 Coccyx Mid Stage 2 -  Partial thickness loss of dermis presenting as a shallow open injury with a red, pink wound bed without slough. (Active)  04/13/22 1545  Location: Coccyx  Location Orientation: Mid  Staging: Stage 2 -  Partial thickness loss of dermis presenting as a shallow open injury with a red, pink wound bed without slough.  Wound Description (Comments):   Present on Admission: Yes      DVT prophylaxis: Eliquis Code Status: Full Family Communication: none at bedside Disposition:   Patient will need further hospitalization as still with poor oral intake, persistent nausea, poor oral intake due to gastroparesis.    Consultants:  None   Subjective:  She is still complaining of nausea, abd pain, and poor appetitie.  Objective: Vitals:   04/14/22 1437 04/14/22 1949 04/15/22 0004 04/15/22 0400  BP: (!) 122/52 (!) 131/54 (!) 141/65 (!) 121/50  Pulse: 63 62 62 (!) 50  Resp: '14 16 14 17  '$ Temp: 98.6 F (37 C) 98.7 F (37.1 C) 98.5 F (36.9 C) 98.9 F (37.2 C)  TempSrc: Oral Oral Oral Oral  SpO2: 98% 96%  98%  Weight:  Height:       No intake or output data in the 24 hours ending 04/15/22 1401   Filed Weights   04/11/22 1712  Weight: 72.6 kg    Examination:  Awake Alert, Oriented X 3, No new F.N deficits, Normal affect Symmetrical Chest wall movement, Good air movement bilaterally, CTAB RRR,No Gallops,Rubs or new Murmurs, No Parasternal Heave +ve B.Sounds, Abd Soft, mild  epigastric tenderness, No rebound - guarding or rigidity. No Cyanosis, Clubbing or edema, No new Rash or bruise        Data Reviewed: I have personally reviewed following labs and imaging studies  CBC: Recent Labs  Lab 04/11/22 1246 04/11/22 1544  WBC 4.6  --   HGB 13.2 11.9*  HCT 37.9 35.0*  MCV 81.9  --   PLT 141*  --     Basic Metabolic Panel: Recent Labs  Lab 04/11/22 1246 04/11/22 1544 04/12/22 0256 04/13/22 0300 04/14/22 0255 04/15/22 0457  NA 141 135 142 139 139 138  K 5.0 5.0 3.9 4.5 4.3 4.5  CL 106  --  109 108 108 107  CO2 21*  --  '26 24 24 23  '$ GLUCOSE 617*  --  174* 151* 142* 167*  BUN 63*  --  51* 35* 26* 26*  CREATININE 2.85*  --  2.38* 1.79* 1.63* 1.68*  CALCIUM 10.7*  --  10.5* 9.9 9.8 9.9    GFR: Estimated Creatinine Clearance: 28.6 mL/min (A) (by C-G formula based on SCr of 1.68 mg/dL (H)).  Liver Function Tests: Recent Labs  Lab 04/11/22 1246  AST 20  ALT 20  ALKPHOS 60  BILITOT 0.5  PROT 6.7  ALBUMIN 3.4*    CBG: Recent Labs  Lab 04/14/22 1442 04/14/22 1759 04/14/22 2118 04/15/22 0801 04/15/22 1313  GLUCAP 180* 163* 206* 171* 283*     Recent Results (from the past 240 hour(s))  Resp Panel by RT-PCR (Flu A&B, Covid) Anterior Nasal Swab     Status: None   Collection Time: 04/11/22  3:26 PM   Specimen: Anterior Nasal Swab  Result Value Ref Range Status   SARS Coronavirus 2 by RT PCR NEGATIVE NEGATIVE Final    Comment: (NOTE) SARS-CoV-2 target nucleic acids are NOT DETECTED.  The SARS-CoV-2 RNA is generally detectable in upper respiratory specimens during the acute phase of infection. The lowest concentration of SARS-CoV-2 viral copies this assay can detect is 138 copies/mL. A negative result does not preclude SARS-Cov-2 infection and should not be used as the sole basis for treatment or other patient management decisions. A negative result may occur with  improper specimen collection/handling, submission of specimen  other than nasopharyngeal swab, presence of viral mutation(s) within the areas targeted by this assay, and inadequate number of viral copies(<138 copies/mL). A negative result must be combined with clinical observations, patient history, and epidemiological information. The expected result is Negative.  Fact Sheet for Patients:  EntrepreneurPulse.com.au  Fact Sheet for Healthcare Providers:  IncredibleEmployment.be  This test is no t yet approved or cleared by the [place] FDA and  has been authorized for detection and/or diagnosis of SARS-CoV-2 by FDA under an Emergency Use Authorization (EUA). This EUA will remain  in effect (meaning this test can be used) for the duration of the COVID-19 declaration under Section 564(b)(1) of the Act, 21 U.S.C.section 360bbb-3(b)(1), unless the authorization is terminated  or revoked sooner.       Influenza A by PCR NEGATIVE NEGATIVE Final   Influenza B by PCR  NEGATIVE NEGATIVE Final    Comment: (NOTE) The Xpert Xpress SARS-CoV-2/FLU/RSV plus assay is intended as an aid in the diagnosis of influenza from Nasopharyngeal swab specimens and should not be used as a sole basis for treatment. Nasal washings and aspirates are unacceptable for Xpert Xpress SARS-CoV-2/FLU/RSV testing.  Fact Sheet for Patients: EntrepreneurPulse.com.au  Fact Sheet for Healthcare Providers: IncredibleEmployment.be  This test is not yet approved or cleared by the [place] FDA and has been authorized for detection and/or diagnosis of SARS-CoV-2 by FDA under an Emergency Use Authorization (EUA). This EUA will remain in effect (meaning this test can be used) for the duration of the COVID-19 declaration under Section 564(b)(1) of the Act, 21 U.S.C. section 360bbb-3(b)(1), unless the authorization is terminated or revoked.  Performed at [facility], [place] [address] [place],  [place] [address]   Group A Strep by PCR     Status: None   Collection Time: 04/11/22  3:26 PM   Specimen: Anterior Nasal Swab; Sterile Swab  Result Value Ref Range Status   Group A Strep by PCR NOT DETECTED NOT DETECTED Final    Comment: Performed at [facility], [place] [address], [place], [place] [address]         Radiology Studies: No results found.      Scheduled Meds:  amiodarone  200 mg Oral Daily   apixaban  5 mg Oral BID   atorvastatin  80 mg Oral QPM   clopidogrel  75 mg Oral Daily   feeding supplement (GLUCERNA SHAKE)  237 mL Oral TID BM   insulin aspart  0-15 Units Subcutaneous TID WC   insulin aspart  0-5 Units Subcutaneous QHS   insulin glargine-yfgn  20 Units Subcutaneous Daily   metoCLOPramide (REGLAN) injection  5 mg Intravenous Q6H   multivitamin with minerals  1 tablet Oral Daily   sacubitril-valsartan  0.5 tablet Oral BID   Continuous Infusions:  erythromycin        LOS: 2 days      Phillips Climes, MD [facility]   To contact the attending provider between 7A-7P or the covering provider during after hours 7P-7A, please log into the web site [email or web address] and access using universal [place] password for that web site. If you do not have the password, please call the hospital operator.  04/15/2022, 2:01 PM

## 2022-04-15 NOTE — Progress Notes (Signed)
PT Cancellation Note  Patient Details Name: Cassandra Adams MRN: [id number] DOB: 1945/07/04   Cancelled Treatment:    Reason Eval/Treat Not Completed: Medical issues which prohibited therapy.  Declined PT stating she was dizzy/light headed upon standing earlier.  Will reattempt as time and pt allow.  Pt has declined bed level tx, unsure of source of her symptoms.   Ramond Dial 04/15/2022, 3:01 PM  Mee Hives, PT PhD Acute Rehab Dept. Number: [place]

## 2022-04-16 DIAGNOSIS — K3184 Gastroparesis: Secondary | ICD-10-CM | POA: Diagnosis not present

## 2022-04-16 DIAGNOSIS — Z794 Long term (current) use of insulin: Secondary | ICD-10-CM | POA: Diagnosis not present

## 2022-04-16 DIAGNOSIS — E1165 Type 2 diabetes mellitus with hyperglycemia: Secondary | ICD-10-CM | POA: Diagnosis not present

## 2022-04-16 LAB — CBC
HCT: 35.7 % — ABNORMAL LOW (ref 36.0–46.0)
Hemoglobin: 12.9 g/dL (ref 12.0–15.0)
MCH: 29.3 pg (ref 26.0–34.0)
MCHC: 36.1 g/dL — ABNORMAL HIGH (ref 30.0–36.0)
MCV: 81.1 fL (ref 80.0–100.0)
Platelets: 170 10*3/uL (ref 150–400)
RBC: 4.4 MIL/uL (ref 3.87–5.11)
RDW: 14.6 % (ref 11.5–15.5)
WBC: 4.2 10*3/uL (ref 4.0–10.5)
nRBC: 0 % (ref 0.0–0.2)

## 2022-04-16 LAB — BASIC METABOLIC PANEL
Anion gap: 6 (ref 5–15)
BUN: 26 mg/dL — ABNORMAL HIGH (ref 8–23)
CO2: 27 mmol/L (ref 22–32)
Calcium: 10.3 mg/dL (ref 8.9–10.3)
Chloride: 109 mmol/L (ref 98–111)
Creatinine, Ser: 1.66 mg/dL — ABNORMAL HIGH (ref 0.44–1.00)
GFR, Estimated: 32 mL/min — ABNORMAL LOW (ref >60)
Glucose, Bld: 89 mg/dL (ref 70–99)
Potassium: 4.6 mmol/L (ref 3.5–5.1)
Sodium: 142 mmol/L (ref 135–145)

## 2022-04-16 LAB — GLUCOSE, CAPILLARY
Glucose-Capillary: 100 mg/dL — ABNORMAL HIGH (ref 70–99)
Glucose-Capillary: 161 mg/dL — ABNORMAL HIGH (ref 70–99)
Glucose-Capillary: 328 mg/dL — ABNORMAL HIGH (ref 70–99)
Glucose-Capillary: 177 mg/dL — ABNORMAL HIGH (ref 70–99)

## 2022-04-16 LAB — MAGNESIUM: Magnesium: 2.3 mg/dL (ref 1.7–2.4)

## 2022-04-16 NOTE — Progress Notes (Signed)
Occupational Therapy Treatment Patient Details Name: Cassandra Adams MRN: [id number] DOB: 1946/06/05 Today's Date: 04/16/2022   History of present illness 76 yo female with onset of elevated BS was admitted on 10/17, referred to PT to ck  movement.  PMHx:  atherosclerosis, CHF, a-fib, CKD 3b   OT comments  Pt is making incremental progress, she continues to be limited by general malaise and nausea however agreeable to participate this date. General supervision A provided for all transfers, mobility and standing tasks at the sink with no apparent safety concerns or LOB. OT to continue to follow acutely. POC remains appropriate.    Recommendations for follow up therapy are one component of a multi-disciplinary discharge planning process, led by the attending physician.  Recommendations may be updated based on patient status, additional functional criteria and insurance authorization.    Follow Up Recommendations  No OT follow up    Assistance Recommended at Discharge Intermittent Supervision/Assistance  Patient can return home with the following  Assist for transportation;Assistance with cooking/housework   Equipment Recommendations  None recommended by OT       Precautions / Restrictions Precautions Precautions: Fall Precaution Comments: monitor vitals Restrictions Weight Bearing Restrictions: No       Mobility Bed Mobility               General bed mobility comments: OOB in chair upon arrival    Transfers Overall transfer level: Needs assistance Equipment used: Rolling walker (2 wheels) Transfers: Sit to/from Stand Sit to Stand: Supervision                 Balance Overall balance assessment: Needs assistance Sitting-balance support: Feet supported Sitting balance-Leahy Scale: Good     Standing balance support: Single extremity supported, During functional activity Standing balance-Leahy Scale: Fair                             ADL  either performed or assessed with clinical judgement   ADL Overall ADL's : Needs assistance/impaired     Grooming: Wash/dry hands;Wash/dry face;Oral care;Supervision/safety;Standing Grooming Details (indicate cue type and reason): at the sink                 Toilet Transfer: Supervision/safety;Rolling walker (2 wheels)           Functional mobility during ADLs: Supervision/safety;Rolling walker (2 wheels) General ADL Comments: limited by nausea and general malaise    Extremity/Trunk Assessment Upper Extremity Assessment Upper Extremity Assessment: Overall WFL for tasks assessed   Lower Extremity Assessment Lower Extremity Assessment: Defer to PT evaluation        Vision   Vision Assessment?: No apparent visual deficits   Perception Perception Perception: Within Functional Limits   Praxis Praxis Praxis: Intact    Cognition Arousal/Alertness: Awake/alert Behavior During Therapy: WFL for tasks assessed/performed Overall Cognitive Status: Within Functional Limits for tasks assessed                                          Exercises      Shoulder Instructions       General Comments VSS on RA, neausea and general malaise limited    Pertinent Vitals/ Pain       Pain Assessment Pain Assessment: Faces Faces Pain Scale: Hurts a little bit Pain Location: general malaise Pain Descriptors / Indicators: Discomfort Pain  Intervention(s): Limited activity within patient's tolerance, Monitored during session  Home Living                                          Prior Functioning/Environment              Frequency  Min 2X/week        Progress Toward Goals  OT Goals(current goals can now be found in the care plan section)  Progress towards OT goals: Progressing toward goals  Acute Rehab OT Goals Patient Stated Goal: to feel better OT Goal Formulation: With patient Time For Goal Achievement: 04/28/22 Potential  to Achieve Goals: Good  Plan Discharge plan remains appropriate    Co-evaluation                 AM-PAC OT "6 Clicks" Daily Activity     Outcome Measure   Help from another person eating meals?: None Help from another person taking care of personal grooming?: None Help from another person toileting, which includes using toliet, bedpan, or urinal?: A Little Help from another person bathing (including washing, rinsing, drying)?: A Little Help from another person to put on and taking off regular upper body clothing?: None Help from another person to put on and taking off regular lower body clothing?: A Little 6 Click Score: 21    End of Session Equipment Utilized During Treatment: Rolling walker (2 wheels)  OT Visit Diagnosis: Unsteadiness on feet (R26.81)   Activity Tolerance Patient tolerated treatment well   Patient Left in chair;with call bell/phone within reach   Nurse Communication Mobility status        Time: 1300-1316 OT Time Calculation (min): 16 min  Charges: OT General Charges $OT Visit: 1 Visit OT Treatments $Therapeutic Activity: 8-22 mins    Elliot Cousin 04/16/2022, 1:56 PM

## 2022-04-16 NOTE — Progress Notes (Addendum)
PROGRESS NOTE    Cassandra Adams  KXF:[id number] DOB: [birth year] DOA: 04/11/2022 PCP: Sharilyn Sites, MD   Chief Complaint  Patient presents with   Hyperglycemia   Emesis    Brief Narrative:    Cassandra Adams is a 76 y.o. female with medical history significant of DM2, CAD, TIA, PAF on eliquis and amiodarone, HFpEF, CKD 3B. Pt in to ED 4 days ago with hyperglycemia.  Well she developed abdominal pain, nausea and vomiting which was constant over last few days, has diarrhea, constipation, CBG was significantly elevated since Thursday, so she presents to ED for further management, work-up was significant for worsening renal failure, hyperglycemia, so she is admitted for further work-up.    Assessment & Plan:   Principal Problem:   Hyperglycemia due to type 2 diabetes mellitus ([place]) Active Problems:   AKI (acute kidney injury) ([place])   Stage 3b chronic kidney disease (CKD) (HCC)   Nausea & vomiting   Atrial fibrillation, chronic (HCC)   Chronic diastolic CHF (congestive heart failure) (HCC)   Pressure injury of skin   Hyperglycemia due to type 2 diabetes mellitus ([place]) - Severe Hyperglycemia despite taking home insulin.  Is most likely stress related, with her significant nausea and vomiting -DKA, but given unreliable oral intake and labile/high CBGs she was on insulin drip initially, she is currently transitioned to [facility] and insulin sliding scale . -CBG better controlled on current regimen, continue with Semglee and insulin sliding scale  -Continue to hold Jardiance and resume when more stable .  AKI on CKD 3B  -Baseline creatinine around 2, creatinine peaked at 2.78 on admission, this has improved with IV fluids. -Resume back on Entresto and Jardiance today. -Continue to hold diuretics.  Nausea/vomiting/abdominal pain Gastroparesis -She still reports nausea, abdominal pain, but no vomiting, she is having poor oral intake despite being on scheduled IV Reglan,  she was counseled and educated again about smaller portion more frequent meals. -Patient to be improving on IV erythromycin and IV Reglan, hopefully by tomorrow can be discharged on oral Reglan.   Chronic diastolic CHF (congestive heart failure) (HCC) -Volume status has improved . -Resume Entresto, continue to hold Lasix, Aldactone and Imdur .  Atrial fibrillation, chronic (HCC) -Continue with amiodarone and Eliquis -We will resume Coreg now blood pressure has improved  Pressure ulcer Present on admission   Pressure Injury 04/13/22 Coccyx Mid Stage 2 -  Partial thickness loss of dermis presenting as a shallow open injury with a red, pink wound bed without slough. (Active)  04/13/22 1545  Location: Coccyx  Location Orientation: Mid  Staging: Stage 2 -  Partial thickness loss of dermis presenting as a shallow open injury with a red, pink wound bed without slough.  Wound Description (Comments):   Present on Admission: Yes      DVT prophylaxis: Eliquis Code Status: Full Family Communication: none at bedside Disposition:   Patient will need further hospitalization as still with poor oral intake, persistent nausea, poor oral intake due to gastroparesis.    Consultants:  None   Subjective:  Reports appetite has improved, as well her nausea, abdominal pain still there but much improved as well.  Objective: Vitals:   04/15/22 2357 04/16/22 0330 04/16/22 0814 04/16/22 1227  BP: (!) 120/55 (!) 150/57 (!) 129/51 (!) 146/64  Pulse: (!) 56 (!) 58 (!) 56 65  Resp: '20 17 18 20  '$ Temp: 98.1 F (36.7 C) 98.1 F (36.7 C) 98.8 F (37.1 C) 98 F (36.7 C)  TempSrc: Oral Oral Oral Oral  SpO2:  99%    Weight:      Height:        Intake/Output Summary (Last 24 hours) at 04/16/2022 1344 Last data filed at 04/15/2022 1700 Gross per 24 hour  Intake 650 ml  Output --  Net 650 ml     Filed Weights   04/11/22 1712  Weight: 72.6 kg    Examination:  Awake Alert, Oriented X 3,  No new F.N deficits, Normal affect Symmetrical Chest wall movement, Good air movement bilaterally, CTAB RRR,No Gallops,Rubs or new Murmurs, No Parasternal Heave +ve B.Sounds, Abd Soft, mild epigastric tenderness, No rebound - guarding or rigidity. No Cyanosis, Clubbing or edema, No new Rash or bruise         Data Reviewed: I have personally reviewed following labs and imaging studies  CBC: Recent Labs  Lab 04/11/22 1246 04/11/22 1544 04/16/22 0218  WBC 4.6  --  4.2  HGB 13.2 11.9* 12.9  HCT 37.9 35.0* 35.7*  MCV 81.9  --  81.1  PLT 141*  --  196    Basic Metabolic Panel: Recent Labs  Lab 04/12/22 0256 04/13/22 0300 04/14/22 0255 04/15/22 0457 04/16/22 0218  NA 142 139 139 138 142  K 3.9 4.5 4.3 4.5 4.6  CL 109 108 108 107 109  CO2 '26 24 24 23 27  '$ GLUCOSE 174* 151* 142* 167* 89  BUN 51* 35* 26* 26* 26*  CREATININE 2.38* 1.79* 1.63* 1.68* 1.66*  CALCIUM 10.5* 9.9 9.8 9.9 10.3  MG  --   --   --   --  2.3    GFR: Estimated Creatinine Clearance: 28.9 mL/min (A) (by C-G formula based on SCr of 1.66 mg/dL (H)).  Liver Function Tests: Recent Labs  Lab 04/11/22 1246  AST 20  ALT 20  ALKPHOS 60  BILITOT 0.5  PROT 6.7  ALBUMIN 3.4*    CBG: Recent Labs  Lab 04/15/22 1313 04/15/22 1806 04/15/22 2134 04/16/22 0813 04/16/22 1227  GLUCAP 283* 209* 259* 100* 161*     Recent Results (from the past 240 hour(s))  Resp Panel by RT-PCR (Flu A&B, Covid) Anterior Nasal Swab     Status: None   Collection Time: 04/11/22  3:26 PM   Specimen: Anterior Nasal Swab  Result Value Ref Range Status   SARS Coronavirus 2 by RT PCR NEGATIVE NEGATIVE Final    Comment: (NOTE) SARS-CoV-2 target nucleic acids are NOT DETECTED.  The SARS-CoV-2 RNA is generally detectable in upper respiratory specimens during the acute phase of infection. The lowest concentration of SARS-CoV-2 viral copies this assay can detect is 138 copies/mL. A negative result does not preclude  SARS-Cov-2 infection and should not be used as the sole basis for treatment or other patient management decisions. A negative result may occur with  improper specimen collection/handling, submission of specimen other than nasopharyngeal swab, presence of viral mutation(s) within the areas targeted by this assay, and inadequate number of viral copies(<138 copies/mL). A negative result must be combined with clinical observations, patient history, and epidemiological information. The expected result is Negative.  Fact Sheet for Patients:  EntrepreneurPulse.com.au  Fact Sheet for Healthcare Providers:  IncredibleEmployment.be  This test is no t yet approved or cleared by the [place] FDA and  has been authorized for detection and/or diagnosis of SARS-CoV-2 by FDA under an Emergency Use Authorization (EUA). This EUA will remain  in effect (meaning this test can be used) for the duration of the  COVID-19 declaration under Section 564(b)(1) of the Act, 21 U.S.C.section 360bbb-3(b)(1), unless the authorization is terminated  or revoked sooner.       Influenza A by PCR NEGATIVE NEGATIVE Final   Influenza B by PCR NEGATIVE NEGATIVE Final    Comment: (NOTE) The Xpert Xpress SARS-CoV-2/FLU/RSV plus assay is intended as an aid in the diagnosis of influenza from Nasopharyngeal swab specimens and should not be used as a sole basis for treatment. Nasal washings and aspirates are unacceptable for Xpert Xpress SARS-CoV-2/FLU/RSV testing.  Fact Sheet for Patients: EntrepreneurPulse.com.au  Fact Sheet for Healthcare Providers: IncredibleEmployment.be  This test is not yet approved or cleared by the [place] FDA and has been authorized for detection and/or diagnosis of SARS-CoV-2 by FDA under an Emergency Use Authorization (EUA). This EUA will remain in effect (meaning this test can be used) for the duration of  the COVID-19 declaration under Section 564(b)(1) of the Act, 21 U.S.C. section 360bbb-3(b)(1), unless the authorization is terminated or revoked.  Performed at [facility], [place] [address] [place], [place] [address]   Group A Strep by PCR     Status: None   Collection Time: 04/11/22  3:26 PM   Specimen: Anterior Nasal Swab; Sterile Swab  Result Value Ref Range Status   Group A Strep by PCR NOT DETECTED NOT DETECTED Final    Comment: Performed at [facility], [place] [address] [place],  [address]         Radiology Studies: No results found.      Scheduled Meds:  amiodarone  200 mg Oral Daily   apixaban  5 mg Oral BID   atorvastatin  80 mg Oral QPM   clopidogrel  75 mg Oral Daily   feeding supplement (GLUCERNA SHAKE)  237 mL Oral TID BM   insulin aspart  0-15 Units Subcutaneous TID WC   insulin aspart  0-5 Units Subcutaneous QHS   insulin glargine-yfgn  20 Units Subcutaneous Daily   metoCLOPramide (REGLAN) injection  5 mg Intravenous Q6H   multivitamin with minerals  1 tablet Oral Daily   sacubitril-valsartan  0.5 tablet Oral BID   Continuous Infusions:  erythromycin 500 mg (04/16/22 0904)      LOS: 3 days      Phillips Climes, MD [facility]   To contact the attending provider between 7A-7P or the covering provider during after hours 7P-7A, please log into the web site [email or web address] and access using universal [place] password for that web site. If you do not have the password, please call the hospital operator.  04/16/2022, 1:44 PM

## 2022-04-17 ENCOUNTER — Other Ambulatory Visit (HOSPITAL_COMMUNITY): Payer: Self-pay

## 2022-04-17 DIAGNOSIS — E1165 Type 2 diabetes mellitus with hyperglycemia: Secondary | ICD-10-CM | POA: Diagnosis not present

## 2022-04-17 DIAGNOSIS — Z794 Long term (current) use of insulin: Secondary | ICD-10-CM | POA: Diagnosis not present

## 2022-04-17 DIAGNOSIS — I482 Chronic atrial fibrillation, unspecified: Secondary | ICD-10-CM | POA: Diagnosis not present

## 2022-04-17 DIAGNOSIS — K3184 Gastroparesis: Secondary | ICD-10-CM | POA: Diagnosis not present

## 2022-04-17 LAB — GLUCOSE, CAPILLARY
Glucose-Capillary: 114 mg/dL — ABNORMAL HIGH (ref 70–99)
Glucose-Capillary: 249 mg/dL — ABNORMAL HIGH (ref 70–99)
Glucose-Capillary: 208 mg/dL — ABNORMAL HIGH (ref 70–99)

## 2022-04-17 MED ORDER — BUTALBITAL-APAP-CAFFEINE 50-325-40 MG PO TABS
1.0000 | ORAL_TABLET | Freq: Once | ORAL | Status: AC
  Administered 2022-04-17: 1 via ORAL
  Filled 2022-04-17: qty 1

## 2022-04-17 MED ORDER — METOCLOPRAMIDE HCL 5 MG PO TABS
5.0000 mg | ORAL_TABLET | Freq: Three times a day (TID) | ORAL | 0 refills | Status: DC | PRN
  Filled 2022-04-17: qty 30, 10d supply, fill #0

## 2022-04-17 MED ORDER — CARVEDILOL 6.25 MG PO TABS
6.2500 mg | ORAL_TABLET | Freq: Two times a day (BID) | ORAL | 0 refills | Status: DC
  Filled 2022-04-17: qty 60, 30d supply, fill #0

## 2022-04-17 NOTE — Discharge Summary (Signed)
Physician Discharge Summary  Cassandra Adams ZOX:[id number] DOB: 04-10-1946 DOA: 04/11/2022  PCP: Sharilyn Sites, MD  Admit date: 04/11/2022 Discharge date: 04/17/2022  Admitted From: Home, Disposition:  Home   Recommendations for Outpatient Follow-up:  Follow up with PCP in 1-2 weeks Please obtain BMP/CBC in one week Patient is euvolemic at time of discharge, so all diuresis has been held including Lasix, metolazone and [place]: YES   Discharge Condition:Stable CODE STATUS:FULL Diet recommendation: Heart Healthy / Carb Modified   Brief/Interim Summary:  Cassandra Adams is a 76 y.o. female with medical history significant of DM2, CAD, TIA, PAF on eliquis and amiodarone, HFpEF, CKD 3B. Pt in to ED 4 days ago with hyperglycemia.  Well she developed abdominal pain, nausea and vomiting which was constant over last few days, has diarrhea, constipation, CBG was significantly elevated since Thursday, so she presents to ED for further management, work-up was significant for worsening renal failure, hyperglycemia, so she is admitted for further work-up.    Hyperglycemia due to type 2 diabetes mellitus ([place]) - Severe Hyperglycemia despite taking home insulin.  Is most likely stress related, with her significant nausea and vomiting -not in DKA, but given unreliable oral intake and labile/high CBGs she was on insulin drip initially, she is currently transitioned to [facility] and insulin sliding scale . -CBG better controlled, she will be discharged on home regimen including Jardiance.      AKI on CKD 3B  -Baseline creatinine around 2, creatinine peaked at 2.78 on admission, this has improved with IV fluids. -Resumed back on her Delene Loll and Jardiance, will continue to hold diuretics on discharge including Aldactone, metolazone and Lasix given she is euvolemic, and appetite just started to improve.     Nausea/vomiting/abdominal pain Gastroparesis -Reports nausea,  abdominal pain, initially with vomiting but this has resolved, she had gastric emptying study in 2009 which was borderline gastroparesis, so certainly she did progress to gastroparesis, she was treated with IV Reglan, IV erythromycin with improvement of her symptoms, she will be discharged on as needed Reglan    Chronic diastolic CHF (congestive heart failure) ([place]) -She will try, volume status has improved . -Resume Entresto, continue to hold Lasix, Aldactone, and metolazone. -Imdur has been discontinued as well given acceptable blood pressure   Atrial fibrillation, chronic (HCC) -Continue with amiodarone and Eliquis -She was resumed on Coreg.   Pressure ulcer Present on admission     Pressure Injury 04/13/22 Coccyx Mid Stage 2 -  Partial thickness loss of dermis presenting as a shallow open injury with a red, pink wound bed without slough. (Active)  04/13/22 1545  Location: Coccyx  Location Orientation: Mid  Staging: Stage 2 -  Partial thickness loss of dermis presenting as a shallow open injury with a red, pink wound bed without slough.  Wound Description (Comments):   Present on Admission: Yes      Discharge Diagnoses:  Principal Problem:   Hyperglycemia due to type 2 diabetes mellitus ([place]) Active Problems:   AKI (acute kidney injury) ([place])   Stage 3b chronic kidney disease (CKD) (HCC)   Nausea & vomiting   Atrial fibrillation, chronic (HCC)   Chronic diastolic CHF (congestive heart failure) ([place])   Pressure injury of skin    Discharge Instructions  Discharge Instructions     Discharge instructions   Complete by: As directed    Follow with Primary MD Sharilyn Sites, MD in 7 days   Get CBC, CMP,  checked  by  Primary MD next visit.    Activity: As tolerated with Full fall precautions use walker/cane & assistance as needed   Disposition Home   Diet: Carb modified diet, please use smaller portions more frequent meals   On your next visit with your primary care  physician please Get Medicines reviewed and adjusted.   Please request your Prim.MD to go over all Hospital Tests and Procedure/Radiological results at the follow up, please get all Hospital records sent to your Prim MD by signing hospital release before you go home.   If you experience worsening of your admission symptoms, develop shortness of breath, life threatening emergency, suicidal or homicidal thoughts you must seek medical attention immediately by calling 911 or calling your MD immediately  if symptoms less severe.  You Must read complete instructions/literature along with all the possible adverse reactions/side effects for all the Medicines you take and that have been prescribed to you. Take any new Medicines after you have completely understood and accpet all the possible adverse reactions/side effects.   Do not drive, operating heavy machinery, perform activities at heights, swimming or participation in water activities or provide baby sitting services if your were admitted for syncope or siezures until you have seen by Primary MD or a Neurologist and advised to do so again.  Do not drive when taking Pain medications.    Do not take more than prescribed Pain, Sleep and Anxiety Medications  Special Instructions: If you have smoked or chewed Tobacco  in the last 2 yrs please stop smoking, stop any regular Alcohol  and or any Recreational drug use.  Wear Seat belts while driving.   Please note  You were cared for by a hospitalist during your hospital stay. If you have any questions about your discharge medications or the care you received while you were in the hospital after you are discharged, you can call the unit and asked to speak with the hospitalist on call if the hospitalist that took care of you is not available. Once you are discharged, your primary care physician will handle any further medical issues. Please note that NO REFILLS for any discharge medications will be  authorized once you are discharged, as it is imperative that you return to your primary care physician (or establish a relationship with a primary care physician if you do not have one) for your aftercare needs so that they can reassess your need for medications and monitor your lab values.   No wound care   Complete by: As directed       Allergies as of 04/17/2022       Reactions   Penicillins Hives   Has patient had a PCN reaction causing immediate rash, facial/tongue/throat swelling, SOB or lightheadedness with hypotension: no Has patient had a PCN reaction causing severe rash involving mucus membranes or skin necrosis: No  Has patient had a PCN reaction that required hospitalization: no Has patient had a PCN reaction occurring within the last 10 years: no If all of the above answers are "NO", then may proceed with Cephalosporin use.        Medication List     STOP taking these medications    furosemide 40 MG tablet Commonly known as: LASIX   metolazone 5 MG tablet Commonly known as: ZAROXOLYN   spironolactone 50 MG tablet Commonly known as: ALDACTONE       TAKE these medications    amiodarone 200 MG tablet Commonly known as: Pacerone Take 1 tablet (200 mg  total) by mouth daily. Start taking from 12/23/21   atorvastatin 80 MG tablet Commonly known as: LIPITOR Take 1 tablet (80 mg total) by mouth every evening.   B-D ULTRAFINE III SHORT PEN 31G X 8 MM Misc Generic drug: Insulin Pen Needle SMARTSIG:Injection Daily   Basaglar KwikPen 100 UNIT/ML Inject 10 Units into the skin at bedtime. What changed: how much to take   BD Insulin Syringe U/F 31G X 5/16" 0.5 ML Misc Generic drug: Insulin Syringe-Needle U-100 Use to take with Novolog TIDAC   carvedilol 6.25 MG tablet Commonly known as: COREG Take 1 tablet (6.25 mg total) by mouth 2 (two) times daily.   clopidogrel 75 MG tablet Commonly known as: PLAVIX Take 1 tablet (75 mg total) by mouth daily.    colesevelam 625 MG tablet Commonly known as: WELCHOL Take 1,875 mg by mouth 2 (two) times daily with a meal. Noon and night   Eliquis 5 MG Tabs tablet Generic drug: apixaban Take 1 tablet (5 mg total) by mouth 2 (two) times daily.   FreeStyle Libre 2 Reader [place] 1 each by Does not apply route daily in the afternoon.   FreeStyle Libre 2 Sensor Misc 1 each by Does not apply route daily in the afternoon.   isosorbide mononitrate 60 MG 24 hr tablet Commonly known as: IMDUR Take 60 mg by mouth every morning.   Jardiance 10 MG Tabs tablet Generic drug: empagliflozin Take 10 mg by mouth daily.   metoCLOPramide 5 MG tablet Commonly known as: Reglan Take 1 tablet (5 mg total) by mouth every 8 (eight) hours as needed for up to 10 days for nausea.   nitroGLYCERIN 0.4 MG SL tablet Commonly known as: NITROSTAT PLACE 1 TABLET UNDER THE TONGUE EVERY 5 MINUTES AS NEEDED FOR CHEST PAIN (UP TO 3 DOSES) What changed: See the new instructions.   NovoLOG 100 UNIT/ML injection Generic drug: insulin aspart Inject 6-9 Units into the skin 3 (three) times daily with meals. Sliding Scale   OneTouch Ultra test strip Generic drug: glucose blood TESTING 3 TIMES DAILY   polyethylene glycol 17 g packet Commonly known as: MIRALAX / GLYCOLAX Take 17 g by mouth daily.   sacubitril-valsartan 97-103 MG Commonly known as: ENTRESTO Take 0.5 tablets by mouth 2 (two) times daily. Start 12/28/21-STOP lasix when started What changed:  how much to take additional instructions   Vitamin D (Cholecalciferol) 25 MCG (1000 UT) Tabs Take 2,000 Units by mouth daily.        Follow-up [place]. Follow up.   Why: home health has been arranged. they will contact you to schedule apt. Contact information: [address] [place] [address] [phone]                Allergies  Allergen Reactions   Penicillins Hives    Has patient had a PCN reaction  causing immediate rash, facial/tongue/throat swelling, SOB or lightheadedness with hypotension: no Has patient had a PCN reaction causing severe rash involving mucus membranes or skin necrosis: No  Has patient had a PCN reaction that required hospitalization: no Has patient had a PCN reaction occurring within the last 10 years: no If all of the above answers are "NO", then may proceed with Cephalosporin use.     Consultations: none   Procedures/Studies: CT ABDOMEN PELVIS WO CONTRAST  Result Date: 04/11/2022 CLINICAL DATA:  Left lower quadrant abdominal pain EXAM: CT ABDOMEN AND PELVIS WITHOUT CONTRAST  TECHNIQUE: Multidetector CT imaging of the abdomen and pelvis was performed following the standard protocol without IV contrast. RADIATION DOSE REDUCTION: This exam was performed according to the departmental dose-optimization program which includes automated exposure control, adjustment of the mA and/or kV according to patient size and/or use of iterative reconstruction technique. COMPARISON:  CT abdomen and pelvis 09/14/2021 FINDINGS: Lower chest: No acute abnormality. Hepatobiliary: No focal liver abnormality is seen. No gallstones, gallbladder wall thickening, or biliary dilatation. Pancreas: Unremarkable. Spleen: Unremarkable. Adrenals/Urinary Tract: Unremarkable adrenal glands. Unremarkable noncontrast appearance of the kidneys. No urinary calculi or hydronephrosis. Unremarkable bladder. Stomach/Bowel: Unremarkable stomach. Normal caliber large and small bowel. Moderate colonic stool burden. Vascular/Lymphatic: Aortic atherosclerosis. No enlarged abdominal or pelvic lymph nodes. Reproductive: Calcified fibroid in the uterus. No suspicious adnexal mass. Other: No free intraperitoneal fluid or air. Musculoskeletal: No acute osseous abnormality. Thoracolumbar spondylosis. Body wall edema. Small fat containing umbilical hernia. Small fat containing bilateral inguinal hernias. Coarse calcifications  both breasts. IMPRESSION: No acute findings in the abdomen or pelvis. Aortic Atherosclerosis (ICD10-I70.0). Electronically Signed   By: Placido Sou M.D.   On: 04/11/2022 18:59   CT Head Wo Contrast  Result Date: 04/07/2022 CLINICAL DATA:  Head and neck trauma. EXAM: CT HEAD WITHOUT CONTRAST CT CERVICAL SPINE WITHOUT CONTRAST TECHNIQUE: Multidetector CT imaging of the head and cervical spine was performed following the standard protocol without intravenous contrast. Multiplanar CT image reconstructions of the cervical spine were also generated. RADIATION DOSE REDUCTION: This exam was performed according to the departmental dose-optimization program which includes automated exposure control, adjustment of the mA and/or kV according to patient size and/or use of iterative reconstruction technique. COMPARISON:  11/05/2020. FINDINGS: CT HEAD FINDINGS Brain: No acute intracranial hemorrhage, midline shift or mass effect. No extra-axial fluid collection. Mild periventricular white matter hypodensities are present bilaterally. No hydrocephalus. Vascular: No hyperdense vessel or unexpected calcification. Skull: Normal. Negative for fracture or focal lesion. Sinuses/Orbits: Mucosal thickening is present in the right maxillary sinus. The remaining paranasal sinuses are clear. No acute orbital abnormality. Other: None. CT CERVICAL SPINE FINDINGS Alignment: Mild anterolisthesis at C7-T1. Skull base and vertebrae: No acute fracture. There is fusion of the C5-C6 vertebral bodies Soft tissues and spinal canal: No prevertebral fluid or swelling. No visible canal hematoma. Disc levels: Intervertebral degenerative endplate changes and facet arthropathy, most pronounced at C6-C7. Upper chest: Negative. Other: Carotid artery calcifications on the left. IMPRESSION: 1. No acute intracranial process. 2. Degenerative changes in the cervical spine without evidence of acute fracture. Electronically Signed   By: Brett Fairy M.D.    On: 04/07/2022 20:51   CT Cervical Spine Wo Contrast  Result Date: 04/07/2022 CLINICAL DATA:  Head and neck trauma. EXAM: CT HEAD WITHOUT CONTRAST CT CERVICAL SPINE WITHOUT CONTRAST TECHNIQUE: Multidetector CT imaging of the head and cervical spine was performed following the standard protocol without intravenous contrast. Multiplanar CT image reconstructions of the cervical spine were also generated. RADIATION DOSE REDUCTION: This exam was performed according to the departmental dose-optimization program which includes automated exposure control, adjustment of the mA and/or kV according to patient size and/or use of iterative reconstruction technique. COMPARISON:  11/05/2020. FINDINGS: CT HEAD FINDINGS Brain: No acute intracranial hemorrhage, midline shift or mass effect. No extra-axial fluid collection. Mild periventricular white matter hypodensities are present bilaterally. No hydrocephalus. Vascular: No hyperdense vessel or unexpected calcification. Skull: Normal. Negative for fracture or focal lesion. Sinuses/Orbits: Mucosal thickening is present in the right maxillary sinus. The remaining paranasal sinuses are  clear. No acute orbital abnormality. Other: None. CT CERVICAL SPINE FINDINGS Alignment: Mild anterolisthesis at C7-T1. Skull base and vertebrae: No acute fracture. There is fusion of the C5-C6 vertebral bodies Soft tissues and spinal canal: No prevertebral fluid or swelling. No visible canal hematoma. Disc levels: Intervertebral degenerative endplate changes and facet arthropathy, most pronounced at C6-C7. Upper chest: Negative. Other: Carotid artery calcifications on the left. IMPRESSION: 1. No acute intracranial process. 2. Degenerative changes in the cervical spine without evidence of acute fracture. Electronically Signed   By: Brett Fairy M.D.   On: 04/07/2022 20:51   DG Knee Complete 4 Views Left  Result Date: 04/07/2022 CLINICAL DATA:  Left hip and knee pain after fall today. EXAM: LEFT  KNEE - COMPLETE 4+ VIEW COMPARISON:  None Available. FINDINGS: No fracture or dislocation. Moderate tricompartmental osteoarthritis, tricompartmental peripheral spurring, diffuse joint space narrowing. Fragmented osteophyte at the lateral joint line. Minimal knee joint effusion. Small quadriceps tendon enthesophyte. IMPRESSION: 1. No fracture or dislocation of the left knee. 2. Moderate tricompartmental osteoarthritis. Electronically Signed   By: Keith Rake M.D.   On: 04/07/2022 20:21   DG Hip Unilat With Pelvis 2-3 Views Left  Result Date: 04/07/2022 CLINICAL DATA:  Left hip and knee pain after fall today. EXAM: DG HIP (WITH OR WITHOUT PELVIS) 2-3V LEFT COMPARISON:  None Available. FINDINGS: No fracture of the pelvis or left hip. The femoral head is seated. Minimal degenerative acetabular spurring. Intact pubic rami. Cubic symphysis and sacroiliac joints are congruent. Popcorn calcifications in the mid pelvis typical of fibroids. IMPRESSION: No fracture of the pelvis or left hip. Electronically Signed   By: Keith Rake M.D.   On: 04/07/2022 20:20      Subjective: Appetite has improved, minimal nausea, no abdominal pain.  Discharge Exam: Vitals:   04/17/22 0500 04/17/22 0900  BP:  (!) 124/49  Pulse:    Resp: 19 20  Temp:  97.8 F (36.6 C)  SpO2:  96%   Vitals:   04/16/22 2314 04/17/22 0400 04/17/22 0500 04/17/22 0900  BP: (!) 116/41 (!) 148/50  (!) 124/49  Pulse: (!) 55 (!) 57    Resp: '13 16 19 20  '$ Temp: 98.2 F (36.8 C) 98.5 F (36.9 C)  97.8 F (36.6 C)  TempSrc: Oral Oral  Oral  SpO2: 96% 95%  96%  Weight:      Height:        General: Pt is alert, awake, not in acute distress Cardiovascular: RRR, S1/S2 +, no rubs, no gallops Respiratory: CTA bilaterally, no wheezing, no rhonchi Abdominal: Soft, NT, ND, bowel sounds + Extremities: no edema, no cyanosis    The results of significant diagnostics from this hospitalization (including imaging, microbiology,  ancillary and laboratory) are listed below for reference.     Microbiology: Recent Results (from the past 240 hour(s))  Resp Panel by RT-PCR (Flu A&B, Covid) Anterior Nasal Swab     Status: None   Collection Time: 04/11/22  3:26 PM   Specimen: Anterior Nasal Swab  Result Value Ref Range Status   SARS Coronavirus 2 by RT PCR NEGATIVE NEGATIVE Final    Comment: (NOTE) SARS-CoV-2 target nucleic acids are NOT DETECTED.  The SARS-CoV-2 RNA is generally detectable in upper respiratory specimens during the acute phase of infection. The lowest concentration of SARS-CoV-2 viral copies this assay can detect is 138 copies/mL. A negative result does not preclude SARS-Cov-2 infection and should not be used as the sole basis for treatment or other  patient management decisions. A negative result may occur with  improper specimen collection/handling, submission of specimen other than nasopharyngeal swab, presence of viral mutation(s) within the areas targeted by this assay, and inadequate number of viral copies(<138 copies/mL). A negative result must be combined with clinical observations, patient history, and epidemiological information. The expected result is Negative.  Fact Sheet for Patients:  EntrepreneurPulse.com.au  Fact Sheet for Healthcare Providers:  IncredibleEmployment.be  This test is no t yet approved or cleared by the [place] FDA and  has been authorized for detection and/or diagnosis of SARS-CoV-2 by FDA under an Emergency Use Authorization (EUA). This EUA will remain  in effect (meaning this test can be used) for the duration of the COVID-19 declaration under Section 564(b)(1) of the Act, 21 U.S.C.section 360bbb-3(b)(1), unless the authorization is terminated  or revoked sooner.       Influenza A by PCR NEGATIVE NEGATIVE Final   Influenza B by PCR NEGATIVE NEGATIVE Final    Comment: (NOTE) The Xpert Xpress SARS-CoV-2/FLU/RSV plus  assay is intended as an aid in the diagnosis of influenza from Nasopharyngeal swab specimens and should not be used as a sole basis for treatment. Nasal washings and aspirates are unacceptable for Xpert Xpress SARS-CoV-2/FLU/RSV testing.  Fact Sheet for Patients: EntrepreneurPulse.com.au  Fact Sheet for Healthcare Providers: IncredibleEmployment.be  This test is not yet approved or cleared by the [place] FDA and has been authorized for detection and/or diagnosis of SARS-CoV-2 by FDA under an Emergency Use Authorization (EUA). This EUA will remain in effect (meaning this test can be used) for the duration of the COVID-19 declaration under Section 564(b)(1) of the Act, 21 U.S.C. section 360bbb-3(b)(1), unless the authorization is terminated or revoked.  Performed at [facility], [place] [address], [place], [place] [address]   Group A Strep by PCR     Status: None   Collection Time: 04/11/22  3:26 PM   Specimen: Anterior Nasal Swab; Sterile Swab  Result Value Ref Range Status   Group A Strep by PCR NOT DETECTED NOT DETECTED Final    Comment: Performed at [facility], [place] [address], [place], [place] [address]     Labs: BNP (last 3 results) Recent Labs    06/11/21 1400 08/02/21 1320  BNP 275.0* 56.2   Basic Metabolic Panel: Recent Labs  Lab 04/12/22 0256 04/13/22 0300 04/14/22 0255 04/15/22 0457 04/16/22 0218  NA 142 139 139 138 142  K 3.9 4.5 4.3 4.5 4.6  CL 109 108 108 107 109  CO2 '26 24 24 23 27  '$ GLUCOSE 174* 151* 142* 167* 89  BUN 51* 35* 26* 26* 26*  CREATININE 2.38* 1.79* 1.63* 1.68* 1.66*  CALCIUM 10.5* 9.9 9.8 9.9 10.3  MG  --   --   --   --  2.3   Liver Function Tests: Recent Labs  Lab 04/11/22 1246  AST 20  ALT 20  ALKPHOS 60  BILITOT 0.5  PROT 6.7  ALBUMIN 3.4*   Recent Labs  Lab 04/11/22 1519  LIPASE 22   No results for input(s): "AMMONIA" in the last 168 hours. CBC: Recent Labs   Lab 04/11/22 1246 04/11/22 1544 04/16/22 0218  WBC 4.6  --  4.2  HGB 13.2 11.9* 12.9  HCT 37.9 35.0* 35.7*  MCV 81.9  --  81.1  PLT 141*  --  170   Cardiac Enzymes: No results for input(s): "CKTOTAL", "CKMB", "CKMBINDEX", "TROPONINI" in the last 168 hours. BNP: Invalid input(s): "  POCBNP" CBG: Recent Labs  Lab 04/16/22 0813 04/16/22 1227 04/16/22 1641 04/16/22 2127 04/17/22 0939  GLUCAP 100* 161* 328* 177* 114*   D-Dimer No results for input(s): "DDIMER" in the last 72 hours. Hgb A1c No results for input(s): "HGBA1C" in the last 72 hours. Lipid Profile No results for input(s): "CHOL", "HDL", "LDLCALC", "TRIG", "CHOLHDL", "LDLDIRECT" in the last 72 hours. Thyroid function studies No results for input(s): "TSH", "T4TOTAL", "T3FREE", "THYROIDAB" in the last 72 hours.  Invalid input(s): "FREET3" Anemia work up No results for input(s): "VITAMINB12", "FOLATE", "FERRITIN", "TIBC", "IRON", "RETICCTPCT" in the last 72 hours. Urinalysis    Component Value Date/Time   COLORURINE STRAW (A) 04/11/2022 1310   APPEARANCEUR CLEAR 04/11/2022 1310   LABSPEC 1.024 04/11/2022 1310   PHURINE 5.0 04/11/2022 1310   GLUCOSEU >=500 (A) 04/11/2022 1310   HGBUR NEGATIVE 04/11/2022 1310   BILIRUBINUR NEGATIVE 04/11/2022 1310   KETONESUR 5 (A) 04/11/2022 1310   PROTEINUR NEGATIVE 04/11/2022 1310   UROBILINOGEN 0.2 09/15/2012 1010   NITRITE NEGATIVE 04/11/2022 1310   LEUKOCYTESUR NEGATIVE 04/11/2022 1310   Sepsis Labs Recent Labs  Lab 04/11/22 1246 04/16/22 0218  WBC 4.6 4.2   Microbiology Recent Results (from the past 240 hour(s))  Resp Panel by RT-PCR (Flu A&B, Covid) Anterior Nasal Swab     Status: None   Collection Time: 04/11/22  3:26 PM   Specimen: Anterior Nasal Swab  Result Value Ref Range Status   SARS Coronavirus 2 by RT PCR NEGATIVE NEGATIVE Final    Comment: (NOTE) SARS-CoV-2 target nucleic acids are NOT DETECTED.  The SARS-CoV-2 RNA is generally detectable in  upper respiratory specimens during the acute phase of infection. The lowest concentration of SARS-CoV-2 viral copies this assay can detect is 138 copies/mL. A negative result does not preclude SARS-Cov-2 infection and should not be used as the sole basis for treatment or other patient management decisions. A negative result may occur with  improper specimen collection/handling, submission of specimen other than nasopharyngeal swab, presence of viral mutation(s) within the areas targeted by this assay, and inadequate number of viral copies(<138 copies/mL). A negative result must be combined with clinical observations, patient history, and epidemiological information. The expected result is Negative.  Fact Sheet for Patients:  EntrepreneurPulse.com.au  Fact Sheet for Healthcare Providers:  IncredibleEmployment.be  This test is no t yet approved or cleared by the [place] FDA and  has been authorized for detection and/or diagnosis of SARS-CoV-2 by FDA under an Emergency Use Authorization (EUA). This EUA will remain  in effect (meaning this test can be used) for the duration of the COVID-19 declaration under Section 564(b)(1) of the Act, 21 U.S.C.section 360bbb-3(b)(1), unless the authorization is terminated  or revoked sooner.       Influenza A by PCR NEGATIVE NEGATIVE Final   Influenza B by PCR NEGATIVE NEGATIVE Final    Comment: (NOTE) The Xpert Xpress SARS-CoV-2/FLU/RSV plus assay is intended as an aid in the diagnosis of influenza from Nasopharyngeal swab specimens and should not be used as a sole basis for treatment. Nasal washings and aspirates are unacceptable for Xpert Xpress SARS-CoV-2/FLU/RSV testing.  Fact Sheet for Patients: EntrepreneurPulse.com.au  Fact Sheet for Healthcare Providers: IncredibleEmployment.be  This test is not yet approved or cleared by the [place] FDA and has been  authorized for detection and/or diagnosis of SARS-CoV-2 by FDA under an Emergency Use Authorization (EUA). This EUA will remain in effect (meaning this test can be used) for the duration of the  COVID-19 declaration under Section 564(b)(1) of the Act, 21 U.S.C. section 360bbb-3(b)(1), unless the authorization is terminated or revoked.  Performed at [facility], [place] [address], [place], [place] [address]   Group A Strep by PCR     Status: None   Collection Time: 04/11/22  3:26 PM   Specimen: Anterior Nasal Swab; Sterile Swab  Result Value Ref Range Status   Group A Strep by PCR NOT DETECTED NOT DETECTED Final    Comment: Performed at [facility], [place] [address], [place], [place] [address]     Time coordinating discharge: Over 30 minutes  SIGNED:   Phillips Climes, MD  [facility] 04/17/2022, 2:26 PM Pager   If 7PM-7AM, please contact night-coverage [email or web address] Password [email or web address]

## 2022-04-17 NOTE — TOC Transition Note (Signed)
Transition of Care [facility]) - CM/SW Discharge Note   Patient Details  Name: Cassandra Adams MRN: [id number] Date of Birth: 24-Apr-1946  Transition of Care [facility] CM/SW Contact:  Cyndi Bender, RN Phone Number: 04/17/2022, 12:55 PM   Clinical Narrative:    Patient stable for discharge.  Orders for home health. Choice offered per medicare. Patient deferred to this RNCM to find highly rated home health agency. Amy with [facility] accepted referral.  Patient has all needed DME. Patient states she has transportation to apts and at discharge.  Address, Phone number and PCP verified.  Final next level of care: [place] Barriers to Discharge: Barriers Resolved   Patient Goals and CMS Choice Patient states their goals for this hospitalization and ongoing recovery are:: return home CMS Medicare.gov Compare Post Acute Care list provided to:: Patient Choice offered to / list presented to : Patient  Discharge Placement                       Discharge Plan and Services   Discharge Planning Services: CM Consult                      HH Arranged: PT [facility] Agency: [place] Date [place]: 04/17/22 Time [place]: [place]    Social Determinants of Health (SDOH) Interventions     Readmission Risk Interventions    04/17/2022   12:55 PM 12/12/2021    4:10 PM 01/18/2021    2:41 PM  Readmission Risk Prevention Plan  Transportation Screening Complete Complete Complete  PCP or Specialist Appt within 5-7 Days Complete    PCP or Specialist Appt within 3-5 Days  Complete Complete  Home Care Screening Complete    Medication Review (RN CM) Complete    HRI or Home Care Consult  Complete Complete  Social Work Consult for [place] Planning/Counseling  Complete Complete  Palliative Care Screening  Not Applicable Not Applicable  Medication Review Press [employer])  Referral to Pharmacy Complete

## 2022-04-17 NOTE — Progress Notes (Incomplete)
Pt refused IV Erythromycin '500mg'$ ,complaint of burning pain in her hand while the iv is running and stated could not take it anymore.Managed to run half of the bag though.Dr Marlowe Sax made aware,ordered to

## 2022-04-17 NOTE — Discharge Instructions (Signed)
Follow with Primary MD Sharilyn Sites, MD in 7 days   Get CBC, CMP,  checked  by Primary MD next visit.    Activity: As tolerated with Full fall precautions use walker/cane & assistance as needed   Disposition Home   Diet: Carb modified diet, please use smaller portions more frequent meals   On your next visit with your primary care physician please Get Medicines reviewed and adjusted.   Please request your Prim.MD to go over all Hospital Tests and Procedure/Radiological results at the follow up, please get all Hospital records sent to your Prim MD by signing hospital release before you go home.   If you experience worsening of your admission symptoms, develop shortness of breath, life threatening emergency, suicidal or homicidal thoughts you must seek medical attention immediately by calling 911 or calling your MD immediately  if symptoms less severe.  You Must read complete instructions/literature along with all the possible adverse reactions/side effects for all the Medicines you take and that have been prescribed to you. Take any new Medicines after you have completely understood and accpet all the possible adverse reactions/side effects.   Do not drive, operating heavy machinery, perform activities at heights, swimming or participation in water activities or provide baby sitting services if your were admitted for syncope or siezures until you have seen by Primary MD or a Neurologist and advised to do so again.  Do not drive when taking Pain medications.    Do not take more than prescribed Pain, Sleep and Anxiety Medications  Special Instructions: If you have smoked or chewed Tobacco  in the last 2 yrs please stop smoking, stop any regular Alcohol  and or any Recreational drug use.  Wear Seat belts while driving.   Please note  You were cared for by a hospitalist during your hospital stay. If you have any questions about your discharge medications or the care you received  while you were in the hospital after you are discharged, you can call the unit and asked to speak with the hospitalist on call if the hospitalist that took care of you is not available. Once you are discharged, your primary care physician will handle any further medical issues. Please note that NO REFILLS for any discharge medications will be authorized once you are discharged, as it is imperative that you return to your primary care physician (or establish a relationship with a primary care physician if you do not have one) for your aftercare needs so that they can reassess your need for medications and monitor your lab values.

## 2022-04-17 NOTE — Progress Notes (Signed)
Physical Therapy Treatment Patient Details Name: Cassandra Adams MRN: [id number] DOB: 04-11-1946 Today's Date: 04/17/2022   History of Present Illness 76 yo female with onset of elevated BS was admitted on 10/17, referred to PT to ck  movement.  PMHx:  atherosclerosis, CHF, a-fib, CKD 3b    PT Comments    Great progress, ambulating 75 feet with RW for support at supervision level without evidence of LOB. Mod I with bed mobility and supervision level transfers. No physical assist required during treatment today. Pt eager to return home and will benefit from HHPT to further progress independence with functional mobility. Will monitor and progress until d/c home.   Recommendations for follow up therapy are one component of a multi-disciplinary discharge planning process, led by the attending physician.  Recommendations may be updated based on patient status, additional functional criteria and insurance authorization.  Follow Up Recommendations  Home health PT     Assistance Recommended at Discharge Intermittent Supervision/Assistance  Patient can return home with the following A little help with walking and/or transfers;Assistance with cooking/housework;Assist for transportation   Equipment Recommendations  None recommended by PT    Recommendations for Other Services       Precautions / Restrictions Precautions Precautions: Fall Precaution Comments: monitor vitals Restrictions Weight Bearing Restrictions: No     Mobility  Bed Mobility Overal bed mobility: Modified Independent             General bed mobility comments: Extra time    Transfers Overall transfer level: Needs assistance Equipment used: Rolling walker (2 wheels) Transfers: Sit to/from Stand Sit to Stand: Supervision           General transfer comment: supervision for safety, required extra time, no physical assist needed.    Ambulation/Gait Ambulation/Gait assistance: Supervision Gait  Distance (Feet): 75 Feet Assistive device: Rolling walker (2 wheels) Gait Pattern/deviations: Step-through pattern, Decreased stride length Gait velocity: reduced Gait velocity interpretation: <1.8 ft/sec, indicate of risk for recurrent falls   General Gait Details: Slower gait speed but steady with use of RW for support. Educated on proper use and proximity with turns to prevent instability. VSS. Able to also navigate congested areas in room.   Stairs             Wheelchair Mobility    Modified Rankin (Stroke Patients Only)       Balance Overall balance assessment: Needs assistance Sitting-balance support: Feet supported Sitting balance-Leahy Scale: Good     Standing balance support: During functional activity, No upper extremity supported Standing balance-Leahy Scale: Fair                              Cognition Arousal/Alertness: Awake/alert Behavior During Therapy: WFL for tasks assessed/performed Overall Cognitive Status: Within Functional Limits for tasks assessed                                          Exercises      General Comments General comments (skin integrity, edema, etc.): VSS      Pertinent Vitals/Pain Pain Assessment Pain Assessment: No/denies pain Pain Intervention(s): Monitored during session    Home Living                          Prior Function  PT Goals (current goals can now be found in the care plan section) Acute Rehab PT Goals Patient Stated Goal: to walk and get home PT Goal Formulation: With patient Time For Goal Achievement: 04/20/22 Potential to Achieve Goals: Good Progress towards PT goals: Progressing toward goals    Frequency    Min 3X/week      PT Plan Current plan remains appropriate    Co-evaluation              AM-PAC PT "6 Clicks" Mobility   Outcome Measure  Help needed turning from your back to your side while in a flat bed without using  bedrails?: None Help needed moving from lying on your back to sitting on the side of a flat bed without using bedrails?: None Help needed moving to and from a bed to a chair (including a wheelchair)?: A Little Help needed standing up from a chair using your arms (e.g., wheelchair or bedside chair)?: A Little Help needed to walk in hospital room?: A Little Help needed climbing 3-5 steps with a railing? : A Little 6 Click Score: 20    End of Session Equipment Utilized During Treatment: Gait belt Activity Tolerance: Patient tolerated treatment well Patient left: in chair;with call bell/phone within reach   PT Visit Diagnosis: Unsteadiness on feet (R26.81)     Time: 6759-1638 PT Time Calculation (min) (ACUTE ONLY): 14 min  Charges:  $Gait Training: 8-22 mins                     Candie Mile, PT, DPT Physical Therapist Acute Rehabilitation Services [place] 04/17/2022, 11:01 AM

## 2022-04-18 DIAGNOSIS — E1165 Type 2 diabetes mellitus with hyperglycemia: Secondary | ICD-10-CM | POA: Diagnosis not present

## 2022-04-18 DIAGNOSIS — E785 Hyperlipidemia, unspecified: Secondary | ICD-10-CM | POA: Diagnosis not present

## 2022-04-18 DIAGNOSIS — N1832 Chronic kidney disease, stage 3b: Secondary | ICD-10-CM | POA: Diagnosis not present

## 2022-04-18 DIAGNOSIS — I130 Hypertensive heart and chronic kidney disease with heart failure and stage 1 through stage 4 chronic kidney disease, or unspecified chronic kidney disease: Secondary | ICD-10-CM | POA: Diagnosis not present

## 2022-04-18 DIAGNOSIS — I480 Paroxysmal atrial fibrillation: Secondary | ICD-10-CM | POA: Diagnosis not present

## 2022-04-18 DIAGNOSIS — I5030 Unspecified diastolic (congestive) heart failure: Secondary | ICD-10-CM | POA: Diagnosis not present

## 2022-04-18 DIAGNOSIS — Z794 Long term (current) use of insulin: Secondary | ICD-10-CM | POA: Diagnosis not present

## 2022-04-18 DIAGNOSIS — E0590 Thyrotoxicosis, unspecified without thyrotoxic crisis or storm: Secondary | ICD-10-CM | POA: Diagnosis not present

## 2022-04-18 DIAGNOSIS — I2510 Atherosclerotic heart disease of native coronary artery without angina pectoris: Secondary | ICD-10-CM | POA: Diagnosis not present

## 2022-04-18 DIAGNOSIS — K219 Gastro-esophageal reflux disease without esophagitis: Secondary | ICD-10-CM | POA: Diagnosis not present

## 2022-04-26 DIAGNOSIS — Z23 Encounter for immunization: Secondary | ICD-10-CM | POA: Diagnosis not present

## 2022-04-26 DIAGNOSIS — E1129 Type 2 diabetes mellitus with other diabetic kidney complication: Secondary | ICD-10-CM | POA: Diagnosis not present

## 2022-04-26 DIAGNOSIS — N183 Chronic kidney disease, stage 3 unspecified: Secondary | ICD-10-CM | POA: Diagnosis not present

## 2022-04-26 DIAGNOSIS — D518 Other vitamin B12 deficiency anemias: Secondary | ICD-10-CM | POA: Diagnosis not present

## 2022-04-26 DIAGNOSIS — E663 Overweight: Secondary | ICD-10-CM | POA: Diagnosis not present

## 2022-05-02 DIAGNOSIS — I509 Heart failure, unspecified: Secondary | ICD-10-CM | POA: Diagnosis not present

## 2022-05-08 ENCOUNTER — Telehealth: Payer: Self-pay | Admitting: Gastroenterology

## 2022-05-08 NOTE — Telephone Encounter (Signed)
Daughter called in requesting an OV for her mom d/t recent nausea & vomting, with history of gastroparesis. OV scheduled for 06/20/22 at 3:00 pm with Dr. Tarri Glenn.

## 2022-05-08 NOTE — Telephone Encounter (Signed)
Inbound call from patients daughter stating that patient was in the hospital on 10/17 and was sent home on the 20th she believes, for vomiting. Patients daughter is requesting a call back to discuss if patient needs to be seen by Dr. Tarri Glenn or what the next steps are for patient. Please advise.

## 2022-05-12 DIAGNOSIS — E1129 Type 2 diabetes mellitus with other diabetic kidney complication: Secondary | ICD-10-CM | POA: Diagnosis not present

## 2022-05-12 DIAGNOSIS — Z6825 Body mass index (BMI) 25.0-25.9, adult: Secondary | ICD-10-CM | POA: Diagnosis not present

## 2022-05-12 DIAGNOSIS — E663 Overweight: Secondary | ICD-10-CM | POA: Diagnosis not present

## 2022-05-15 DIAGNOSIS — I5030 Unspecified diastolic (congestive) heart failure: Secondary | ICD-10-CM | POA: Diagnosis not present

## 2022-05-15 DIAGNOSIS — E1165 Type 2 diabetes mellitus with hyperglycemia: Secondary | ICD-10-CM | POA: Diagnosis not present

## 2022-05-15 DIAGNOSIS — I130 Hypertensive heart and chronic kidney disease with heart failure and stage 1 through stage 4 chronic kidney disease, or unspecified chronic kidney disease: Secondary | ICD-10-CM | POA: Diagnosis not present

## 2022-05-15 DIAGNOSIS — N1832 Chronic kidney disease, stage 3b: Secondary | ICD-10-CM | POA: Diagnosis not present

## 2022-05-23 ENCOUNTER — Other Ambulatory Visit: Payer: Self-pay | Admitting: Internal Medicine

## 2022-05-23 DIAGNOSIS — I482 Chronic atrial fibrillation, unspecified: Secondary | ICD-10-CM

## 2022-05-24 DIAGNOSIS — D631 Anemia in chronic kidney disease: Secondary | ICD-10-CM | POA: Diagnosis not present

## 2022-05-24 DIAGNOSIS — I509 Heart failure, unspecified: Secondary | ICD-10-CM | POA: Diagnosis not present

## 2022-05-24 DIAGNOSIS — I129 Hypertensive chronic kidney disease with stage 1 through stage 4 chronic kidney disease, or unspecified chronic kidney disease: Secondary | ICD-10-CM | POA: Diagnosis not present

## 2022-05-24 DIAGNOSIS — N2581 Secondary hyperparathyroidism of renal origin: Secondary | ICD-10-CM | POA: Diagnosis not present

## 2022-05-24 DIAGNOSIS — N184 Chronic kidney disease, stage 4 (severe): Secondary | ICD-10-CM | POA: Diagnosis not present

## 2022-05-24 DIAGNOSIS — N1832 Chronic kidney disease, stage 3b: Secondary | ICD-10-CM | POA: Diagnosis not present

## 2022-05-24 NOTE — Telephone Encounter (Signed)
Prescription refill request for Eliquis received. Indication: Afib  Last office visit: 12/20/21 ([facility])  Scr: 1.66 (04/22/22)  Age: 76 Weight: 72.6kg  Appropriate dose and refill sent to requested pharmacy.

## 2022-05-25 ENCOUNTER — Ambulatory Visit: Payer: Medicare HMO | Attending: Internal Medicine | Admitting: Internal Medicine

## 2022-05-25 ENCOUNTER — Telehealth: Payer: Self-pay | Admitting: Internal Medicine

## 2022-05-25 ENCOUNTER — Encounter: Payer: Self-pay | Admitting: Internal Medicine

## 2022-05-25 VITALS — BP 129/74 | HR 56 | Ht 66.0 in | Wt 161.2 lb

## 2022-05-25 DIAGNOSIS — I480 Paroxysmal atrial fibrillation: Secondary | ICD-10-CM

## 2022-05-25 DIAGNOSIS — Z79899 Other long term (current) drug therapy: Secondary | ICD-10-CM | POA: Diagnosis not present

## 2022-05-25 DIAGNOSIS — I2510 Atherosclerotic heart disease of native coronary artery without angina pectoris: Secondary | ICD-10-CM

## 2022-05-25 DIAGNOSIS — Z7901 Long term (current) use of anticoagulants: Secondary | ICD-10-CM | POA: Diagnosis not present

## 2022-05-25 DIAGNOSIS — I5032 Chronic diastolic (congestive) heart failure: Secondary | ICD-10-CM

## 2022-05-25 MED ORDER — FUROSEMIDE 40 MG PO TABS: 40.0000 mg | ORAL_TABLET | Freq: Every day | ORAL | 3 refills | Status: DC

## 2022-05-25 NOTE — Telephone Encounter (Signed)
Raquel Sarna RN - [facility] calling, she said, since pt has an appt with Dr. Debara Pickett today, they sent an order for pt to get BMP at the office, they faxed the order a few minutes ago. She also wants to make sure if Dr.Hilty received their notes regarding pt's plan of care. She requested to get a call back to confirm if Dr. Debara Pickett received their faxed

## 2022-05-25 NOTE — Progress Notes (Signed)
05/25/2022 ARES TEGTMEYER   [birth year]  [id number]  Primary Physicia Sharilyn Sites, MD Primary Cardiologist: Dr. Debara Pickett  CC: Routine follow-up  HPI:  The patient is a 76 year old African American female with a history of diabetes, hypertension, prior stroke but no prior history of CAD, who initially presented to [facility] on 01/03/2014 with a complaint of chest pain. Initial troponin was elevated at 15.7 and EKG was with nonspecific ST-T changes. She was placed on IV heparin and IV nitroglycerin and was transferred to [facility] for further treatment. Her chest pain resolved. She underwent a diagnostic left heart catheterization, perform by Dr. Gwenlyn Found which demonstrated occlusion of the midportion of the RCA, which was felt to be the infarct-related artery. However, she had grade 3 left right collaterals. She also has moderate LAD artery disease with 50% segmental stenosis in the proximal portion with a focal area of 60-70% stenosis. The left main and left circumflex were both normal. Left ventricular systolic function was normal with an estimated ejection fraction of 60%. Wall motion abnormalities were notable for sublte inferior basal hypokinesia. Given her lack of ongoing symptoms and preserved LV function with excellent collaterals, it was recommended to treat with medical therapy. It was felt that if she were to develop recurrent chest pain then she would be a candidate for PCI and stenting of her RCA. She left the Cath Lab in stable condition and had no post-cath complications and no recurrent chest pain. She was placed on dual antiplatelet therapy with aspirin plus Plavix. She was also placed on a beta blocker, an ACE inhibitor and statin. Also notable this admission, was a hemoglobin A1c level of 8.1. She was instructed to followup with her PCP for better management of her diabetes.  She was discharged home on 01/06/2014.  She presents to clinic today for post hospital  followup. She is accompanied by her daughter. She denies any recurrent anginal symptoms. She also denies dyspnea, syncope/near-syncope. Yesterday however, she did note feeling a sensation of increased warmth and mild dizziness, that was self-limiting. This lasted only a minute or 2. Again, she had no syncope/near-syncope. She denies any further recurrence. She has been fully compliant with her medications.  Ms. Rubert is seen in followup today. Her EKG demonstrates a sinus rhythm today. However I suspect that she continues to have PAF. Unfortunate she took herself off of Xarelto after experiencing some oral bleeding. She did not notify the office and has not been on any anticoagulation for several months. We discussed her higher than normal risk of stroke, based on a CHADSVASC score of 5. She reports only one episode of angina in January, however it was very short-lived and sharp pain. She took a nitroglycerin and the symptoms went away after 10 or 15 minutes, suggesting this may not eventually been angina.  I saw Ms. Napoles today back in the office. Overall she is feeling well. She denies any chest pain or shortness of breath. She's not had any further episodes of A. fib. She is noted to be mildly bradycardic today. Blood pressure has run somewhat high for a while.  02/18/2016  Ms. Austill returns today for follow-up. She reports she's been having some episodes of what sounds like night sweats. She wakes up at night fairly soaked. These don't happen every night and is been no associated weight loss, change in appetite or other constitutional symptoms. She reports she is long past her menopause symptoms. She continues to have morning hypoglycemia  with blood sugars in the 40s. I previously asked to decrease her Lantus insulin. She says that her endocrinologist is aware of this and she has an appointment with him in September. She is not checking her sugars in the middle the night but I'm suspicious of  hypoglycemia. Other possible causes would include autonomic dysfunction, she does have gastroparesis and peripheral neuropathy. In addition she could be having other symptoms such as reflux or other things which cause vagal stimulation and diaphoresis. This is very atypical for coronary disease and there is no associated chest pain or worsening shortness of breath.  03/30/2016  Mrs. Valdivia was seen back today for follow-up. She reports her hot flashes have resolved. Unfortunately even though we decreased her insulin, she had some recurrent hypoglycemia and ended up in the emergency department. She is now having problems with hyperglycemia. 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 She denies any chest pain or worsening shortness of breath. She does have some chronic lower extremity edema which is likely from neuropathy. I've recommended bilateral knee-high 20-30 mmHg compression stockings.  11/24/2016  Mrs. Boughner returns for follow-up. She reported December she had to take one nitroglycerin. She was very active that day and it seemed to resolve her symptoms fairly quickly. She's not required any more nitroglycerin. She is asking for refills of that today. She continues to have problems with hypoglycemia although does seem she has awareness. Recently she had a few episodes of spontaneous diaphoresis. This was not associated with any chest pain and testing of her blood sugar indicated hypoglycemia so I suspect that this is more hypoglycemia awareness. She reports some lower strandy swelling which we discussed in the past would be amenable to compression stockings. She is on daily Lasix. Blood pressure was initially elevated 158/62 of her came out a 134/60. She has managed to lose 10 pounds since I last saw her which I commended her on as well.  11/26/2017  Mrs. Gaspari returns today for routine follow-up.  Overall she is doing well.   She has not required any nitroglycerin since I last saw her.  She denies any bleeding problems on Eliquis.  She is out of some medications including atorvastatin and nearly out of Eliquis.  She also lost her nitroglycerin and needs a refill.  She reports reasonably good blood sugar control.  Her blood pressure has remained elevated.  Recently her PCP started her on losartan, however she ready was on lisinopril.  She is not on any additional blood pressure medications.  Blood pressure today was 169/82.  Given her history of coronary disease, she would be a good candidate for beta-blocker.  EKG shows sinus rhythm with PVCs in fact today and nonspecific T wave changes at 80.  07/03/2019  Mrs. Icenhour returns today for follow-up.  Over the past year she has done well.  She denies any chest pain or worsening shortness of breath.  Blood pressure was very elevated today at 208/98 however she was rushed to get in the office and was upset that her daughter could not come back with her.  I repeated her blood pressure eventually did come down to 132/68.  EKG shows sinus rhythm with some PACs today.  Recent labs from November showed total cholesterol 128, HDL 51, LDL 57 triglycerides 99.  She is working with Dr. Dorris Fetch regarding her diabetes and her hemoglobin A1c is still elevated 8.3.  06/30/2021  Mrs. Cardona was seen today  for hospital follow-up.  She was admitted at [facility] for bradycardia cardia and confusion.  Her carvedilol was held.  Troponins were flat minimally elevated.  She was noted to have some PAF and started on Multaq.  Cardiology was consulted.  Cardiogram was performed which showed inferobasal hypokinesis with EF 55%.  There is severe asymmetric hypertrophy of the basal septal segments.  Left atrium was moderately dilated.  She had no chest pain with this.  She was placed on a 2-week ZIO monitor which is due today.  She reports no recurrent symptomatic atrial fibrillation.  She has noted some  swelling and today has a headache.  Weight is 176 pounds up from 165 pounds previously.  I believe her discharge weight was around 171 pounds.  05/25/2022  Mrs. Blea is seen today in follow-up.  This year has been quite eventful for her.  She was hospitalized in February for precordial chest pain and found to have two-vessel coronary disease with chronic total occlusion of the RCA and severe stenosis of the LAD.  She underwent drug-eluting stent placement to the LAD with a good result.  Subsequently she was hospitalized in March with atrial fibrillation and rapid ventricular response.  She was ultimately placed on amiodarone.  She has subsequently maintained sinus bradycardia.  She was then hospitalized in April for hyperglycemia.  She was treated for this and has still had some issues with elevated blood sugar.  She was admitted again in June with atrial fibrillation and again in October with hyperglycemia.  Currently she says she is feeling well.  Her blood sugars are still quite high she says at x 4 or 500.  She is only on 10 units of long-acting insulin but previously had taken 25 units.  This is being managed by her PCP.  She does have a referral for endocrinology but not until the summer.  She denies any recurrent atrial fibrillation and is in a bradycardic regular rhythm today.  She denies any chest pain.  She is currently on Plavix and Eliquis having been on triple therapy for short period of time.   Current Outpatient Medications  Medication Sig Dispense Refill   amiodarone (PACERONE) 200 MG tablet Take 1 tablet (200 mg total) by mouth daily. Start taking from 12/23/21 30 tablet 2   amLODipine (NORVASC) 5 MG tablet Take 5 mg by mouth daily.     atorvastatin (LIPITOR) 80 MG tablet Take 1 tablet (80 mg total) by mouth every evening. 90 tablet 3   B-D ULTRAFINE III SHORT PEN 31G X 8 MM MISC SMARTSIG:Injection Daily     BD INSULIN SYRINGE U/F 31G X 5/16" 0.5 ML MISC Use to take with Novolog  TIDAC 100 each 5   carvedilol (COREG) 3.125 MG tablet Take 3.125 mg by mouth 2 (two) times daily with a meal.     clopidogrel (PLAVIX) 75 MG tablet Take 1 tablet (75 mg total) by mouth daily. 30 tablet 1   colesevelam (WELCHOL) 625 MG tablet Take 1,875 mg by mouth 2 (two) times daily with a meal. Noon and night     Continuous Blood Gluc Receiver (FREESTYLE LIBRE 2 READER) DEVI 1 each by Does not apply route daily in the afternoon. 1 each 0   Continuous Blood Gluc Sensor (FREESTYLE LIBRE 2 SENSOR) MISC 1 each by Does not apply route daily in the afternoon. 1 each 0   ELIQUIS 5 MG TABS tablet TAKE (1) TABLET BY MOUTH TWICE DAILY. 180 tablet 1  empagliflozin (JARDIANCE) 10 MG TABS tablet Take 10 mg by mouth daily.     Insulin Glargine (BASAGLAR KWIKPEN) 100 UNIT/ML Inject 10 Units into the skin at bedtime. (Patient taking differently: Inject 25 Units into the skin at bedtime.) 30 mL 0   nitroGLYCERIN (NITROSTAT) 0.4 MG SL tablet PLACE 1 TABLET UNDER THE TONGUE EVERY 5 MINUTES AS NEEDED FOR CHEST PAIN (UP TO 3 DOSES) (Patient taking differently: Place 0.4 mg under the tongue every 5 (five) minutes x 3 doses as needed for chest pain.) 25 tablet 5   NOVOLOG 100 UNIT/ML injection Inject 6-9 Units into the skin 3 (three) times daily with meals. Sliding Scale     ONETOUCH ULTRA test strip TESTING 3 TIMES DAILY     polyethylene glycol (MIRALAX / GLYCOLAX) 17 g packet Take 17 g by mouth daily. 14 each 0   potassium chloride SA (KLOR-CON M) 20 MEQ tablet Take 20 mEq by mouth daily.     sacubitril-valsartan (ENTRESTO) 97-103 MG Take 0.5 tablets by mouth 2 (two) times daily. Start 12/28/21-STOP lasix when started (Patient taking differently: Take 1 tablet by mouth 2 (two) times daily.) 60 tablet 3   Vitamin D, Cholecalciferol, 25 MCG (1000 UT) TABS Take 2,000 Units by mouth daily.     metoCLOPramide (REGLAN) 5 MG tablet Take 1 tablet (5 mg total) by mouth every 8 (eight) hours as needed for up to 10 days for  nausea. 30 tablet 0   No current facility-administered medications for this visit.    Allergies  Allergen Reactions   Penicillins Hives    Has patient had a PCN reaction causing immediate rash, facial/tongue/throat swelling, SOB or lightheadedness with hypotension: no Has patient had a PCN reaction causing severe rash involving mucus membranes or skin necrosis: No  Has patient had a PCN reaction that required hospitalization: no Has patient had a PCN reaction occurring within the last 10 years: no If all of the above answers are "NO", then may proceed with Cephalosporin use.     Social History   Socioeconomic History   Marital status: Single    Spouse name: Not on file   Number of children: Not on file   Years of education: Not on file   Highest education level: Not on file  Occupational History   Occupation: retired  Tobacco Use   Smoking status: Never   Smokeless tobacco: Never  Vaping Use   Vaping Use: Never used  Substance and Sexual Activity   Alcohol use: Yes    Comment: rare   Drug use: No   Sexual activity: Not Currently    Birth control/protection: Post-menopausal  Other Topics Concern   Not on file  Social History Narrative   Not on file   Social Determinants of Health   Financial Resource Strain: Not on file  Food Insecurity: Not on file  Transportation Needs: Not on file  Physical Activity: Not on file  Stress: Not on file  Social Connections: Not on file  Intimate Partner Violence: Not on file     Review of Systems: Pertinent items noted in HPI and remainder of comprehensive ROS otherwise negative.   Blood pressure 129/74, pulse (!) 56, height '5\' 6"'$  (1.676 m), weight 161 lb 3.2 oz (73.1 kg), SpO2 97 %.  General appearance: alert, no distress and mildly obese Neck: no carotid bruit and no JVD Lungs: clear to auscultation bilaterally Heart: Regular bradycardia Abdomen: soft, non-tender; bowel sounds normal; no masses,  no  organomegaly Extremities: edema  1+ pedal Pulses: 2+ and symmetric Skin: Skin color, texture, turgor normal. No rashes or lesions Neurologic: Grossly normal Psych: Pleasant  EKG Deferred  PROBLEM LIST: CAD status post PCI to the RCA in 2015, recent chest pain and PCI to the LAD with chronic total occluded RCA (07/2021) PAF - CHADSVASC score of 5 on Eliquis, with breakthrough A-fib and RVR on amiodarone Hypertension Dyslipidemia IDDM with neuropathy, poorly controlled with hyperglycemic admissions Carotid artery stenosis/occlusion - mild 2016 Leg edema  ASSESSMENT AND PLAN:  Mrs. Colton has had numerous hospitalizations over the past year for A-fib, coronary disease, hyperglycemia, etc.  At this point she seems to be somewhat better controlled without any further chest pain or breakthrough A-fib.  Her blood sugars are still poorly controlled and she has a referral for endocrinology but not till the summer.  I encouraged her to call her PCP as she will likely need an increase in her glargine.  She is taking Lasix 40 mg daily in addition to her high-dose Entresto.  I provided her a diuretic sliding scale instructions today.  Follow-up with me in 6 months or sooner as necessary.  [name], MD, [facility] [place] Director of the Advanced Lipid Disorders &  Cardiovascular Risk Reduction Clinic Diplomate of the American Board of Clinical Lipidology Attending Cardiologist  Direct Dial: [phone]  Fax: [phone]  Website:  [email or web address] 05/25/2022 4:09 PM

## 2022-05-25 NOTE — Telephone Encounter (Signed)
Patient had lab work at nephrology office and they were to fax these to [place]

## 2022-05-25 NOTE — Patient Instructions (Signed)
Medication Instructions:   Sliding scale Lasix: Weigh yourself when you get home, then daily in the morning. Your dry weight will be what your scale says on the day you return home. (Today is 161 lbs.).  If you gain more than 3 pounds from dry weight in 24 hours: Increase the Lasix dosing to 40 mg in the morning and 40 mg in the afternoon until weight returns to baseline dry weight. If weight gain is greater than 5 pounds in 2 days: Increased to Lasix 80 mg twice a day and contact the office for further assistance if weight does not go down the next day. If the weight goes down more than 3 pounds from dry weight: Hold Lasix until it returns to baseline dry weight *If you need a refill on your cardiac medications before your next appointment, please call your pharmacy*   Follow-Up: At [facility], you and your health needs are our priority.  As part of our continuing mission to provide you with exceptional heart care, we have created designated Provider Care Teams.  These Care Teams include your primary Cardiologist (physician) and Advanced Practice Providers (APPs -  Physician Assistants and Nurse Practitioners) who all work together to provide you with the care you need, when you need it.  We recommend signing up for the patient portal called "MyChart".  Sign up information is provided on this After Visit Summary.  MyChart is used to connect with patients for Virtual Visits (Telemedicine).  Patients are able to view lab/test results, encounter notes, upcoming appointments, etc.  Non-urgent messages can be sent to your provider as well.   To learn more about what you can do with MyChart, go to [email or web address].    Your next appointment:   6 month(s)  The format for your next appointment:   In Person  Provider:   [name], MD

## 2022-06-08 ENCOUNTER — Encounter (INDEPENDENT_AMBULATORY_CARE_PROVIDER_SITE_OTHER): Payer: Medicare HMO | Admitting: Ophthalmology

## 2022-06-08 DIAGNOSIS — E113411 Type 2 diabetes mellitus with severe nonproliferative diabetic retinopathy with macular edema, right eye: Secondary | ICD-10-CM | POA: Diagnosis not present

## 2022-06-08 DIAGNOSIS — Z794 Long term (current) use of insulin: Secondary | ICD-10-CM | POA: Diagnosis not present

## 2022-06-08 DIAGNOSIS — E113412 Type 2 diabetes mellitus with severe nonproliferative diabetic retinopathy with macular edema, left eye: Secondary | ICD-10-CM | POA: Diagnosis not present

## 2022-06-20 ENCOUNTER — Encounter: Payer: Self-pay | Admitting: Gastroenterology

## 2022-06-20 ENCOUNTER — Ambulatory Visit (INDEPENDENT_AMBULATORY_CARE_PROVIDER_SITE_OTHER): Payer: Medicare HMO | Admitting: Gastroenterology

## 2022-06-20 ENCOUNTER — Telehealth: Payer: Self-pay

## 2022-06-20 VITALS — BP 148/78 | HR 54 | Ht 66.0 in | Wt 170.5 lb

## 2022-06-20 DIAGNOSIS — Z7901 Long term (current) use of anticoagulants: Secondary | ICD-10-CM

## 2022-06-20 DIAGNOSIS — R1084 Generalized abdominal pain: Secondary | ICD-10-CM | POA: Diagnosis not present

## 2022-06-20 DIAGNOSIS — R112 Nausea with vomiting, unspecified: Secondary | ICD-10-CM | POA: Diagnosis not present

## 2022-06-20 NOTE — Telephone Encounter (Signed)
[facility] Pre-operative Risk Assessment     Request for surgical clearance:     Endoscopy Procedure  What type of surgery is being performed?     Endoscopy  When is this surgery scheduled?     TBD  What type of clearance is required ?   Pharmacy  Are there any medications that need to be held prior to surgery and how long? Plavix(5 days) & Eliquis(2 days)  Practice name and name of physician performing surgery?      [facility]  What is your office phone and fax number?      Phone- [phone]  [phone]  Anesthesia type (None, local, MAC, general) ?       MAC

## 2022-06-20 NOTE — Telephone Encounter (Deleted)
.  Cassandra Adams

## 2022-06-20 NOTE — Progress Notes (Signed)
Referring Provider: Sharilyn Sites, MD Primary Care Physician:  Sharilyn Sites, MD  Chief Complaint:     IMPRESSION:  Nausea, vomiting, and abdominal pain not explained by recent CT scan Lower abdominal pain x decades Family history of colon polyps (daughter) Recent bleeding during to epistaxis Erosive gastropathy and erosions on EGD 11/08/20  Does not want to know what's wrong and declines any further work-up regarding the abdominal pain - including imaging or colonoscopy. Does not wish to consider colon cancer screening. I did not recommend FOBT or Cologuard as she says she will not have a colonoscopy under any circumstance. Discussed potential for polyp or cancer as the cause of her symptoms. She does not wish to know, prevent the development of cancer, or treat any findings that may be causing her symptoms. Her daughter was present for the conversation.   She is willing to consider an upper endoscopy given her more recent acute symptoms and that the procedure can be performed without a bowel prep.   I have recommended holding Eliquis for 2 days before endoscopy.  I discussed with the patient that there is a low, but real, risk of a cardiovascular event such as heart attack, stroke, or embolism/thrombosis while off Eliquis. Will communicate by phone or EMR with patient's prescribing provider to confirm that holding the Eliquis is appropriate at this time.    PLAN: - EGD after Plavix and Eliquis washout out (will coordinate with Dr. Debara Pickett) - Refuses colonoscopy   Please see the "Patient Instructions" section for addition details about the plan.  HPI: KALEIGHA CHAMBERLIN is a 76 y.o. female who returns in follow-up. Her daughter accompanies her to this appointment. She was initially seen in consultation by Dr. Benson Norway 11/07/2020 for coffee-ground emesis during a hospitalization for noncardiac chest pain.  I performed her endoscopy 11/09/2018 that showed erosive gastropathy and erosions.   Biopsies were not obtained as she was on Eliquis at the time of her procedure.  Hematemesis was ultimately attributed to recent epistaxis.  She has a history of atrial fibrillation on Eliquis and amiodarone, diastolic CHF, DM, HTN, CAD s/p MI 2015, TIA, and stage 3 CKD.  A gastric emptying scan in 2009 suggested the possibility of gastroparesis with subjectively borderline decreased gastric emptying at 2 hours and mild retention of tracer in the antrum.  However, 38% of the tracer emptied by 60 minutes and 77% emptied at 120 minutes.  She was seen 12/29/20 in the office for the evaluation of many years of chronic abdominal pain described as a nagging pulling in her lower abdomen not associated with defecation, movement, or eating. Feels better with smacking her abdominal wall.  No change in bowel habits reporting a soft bowel movement 2-3 times daily. No blood or mucous in the stools.  Colonoscopy discussed at the time of that visit but the patient did not wish to proceed.  She returns today at her request and hospital follow-up after her recent 6-day hospitalization for hyperglycemia and progressive renal failure after several days of abdominal pain, nausea, and vomiting in October 2023.  CT of the abdomen and pelvis without contrast showed no acute findings.  Symptoms improved with IV Reglan and IV erythromycin in the hospital attributed this to suspected progression in her gastroparesis.  Prior abdominal imaging: - CT abd/pelvis without contrast 09/15/12 to evaluate this same abdominal pain suggested mild appendicitis but was otherwise negative.  - Abdominal films 01/03/14: negative abdominal radiographs.  - CT abd/pelvis with contrast 09/14/21: no acute  findings - CT abd/pelvis without contrast 04/11/22: no acute findings  No prior colonoscopy or colon cancer screening.  Daughter with precancerous polyps. No known family history of colon cancer or polyps. No family history of uterine/endometrial  cancer, pancreatic cancer or gastric/stomach cancer.   Past Medical History:  Diagnosis Date   (HFpEF) heart failure with preserved ejection fraction ([place])    Limited Echo 7/22: Mild asymmetrical LVH due to scar and thinning of basal posterior wall and background conc LVH, apical and mid segments hyperdynamic, EF 60-65, basal inf-lat and basal inf AK, normal RVSF, RVSP 26.6, mild LAE, mild-moderate MR, mild AV sclerosis w/o AS // Echo 5/22: EF 55-60, RVSP 27, mild to mod LAE, poss small to mod eff; AV sclerosis no AS   Arthritis    "all over"   CAD (coronary artery disease)    a. NSTEMI 12/2013 - occluded dominant RCA with L-R collaterals, moderate prox segmental LAD disease, for medical therapy initially, EF 60% with subtle inferobasal hypokinesia. Consider PCI for refractory CP.   CKD (chronic kidney disease), stage III ([place])    Hypertension    Migraines    "weekly" (01/06/2014)   Sickle cell trait ([place])    TIA (transient ischemic attack) 1978   Type II diabetes mellitus ([place])    Vertigo     Past Surgical History:  Procedure Laterality Date   APPENDECTOMY  March 2014   had appendix frozen   CARDIAC CATHETERIZATION  "years ago" & 01/05/2014   CARDIOVERSION N/A 03/17/2014   Procedure: CARDIOVERSION;  Surgeon: [name], MD;  Location: [place];  Service: Cardiovascular;  Laterality: N/A;   CARDIOVERSION N/A 01/14/2021   Procedure: CARDIOVERSION;  Surgeon: [name], MD;  Location[facility] ENDOSCOPY;  Service: Cardiovascular;  Laterality: N/A;   CARDIOVERSION N/A 12/13/2021   Procedure: CARDIOVERSION;  Surgeon: Thayer Headings, MD;  Location: [place];  Service: Cardiovascular;  Laterality: N/A;   CATARACT EXTRACTION W/ INTRAOCULAR LENS  IMPLANT, BILATERAL Bilateral 04/2013-05/2013   CORONARY ATHERECTOMY N/A 08/05/2021   Procedure: CORONARY ATHERECTOMY;  Surgeon: Sherren Mocha, MD;  Location: [place] CV LAB;  Service: Cardiovascular;  Laterality: N/A;   CORONARY  STENT INTERVENTION N/A 08/05/2021   Procedure: CORONARY STENT INTERVENTION;  Surgeon: Sherren Mocha, MD;  Location: [place] CV LAB;  Service: Cardiovascular;  Laterality: N/A;   ENDARTERECTOMY Right 02/05/2017   Procedure: ENDARTERECTOMY CAROTID-RIGHT;  Surgeon: [name], MD;  Location[facility] OR;  Service: Vascular;  Laterality: Right;   ESOPHAGOGASTRODUODENOSCOPY  11/08/2007    Normal esophagus without evidence of Barrett, mass, erosion/ Normal stomach, duodenal bulb   ESOPHAGOGASTRODUODENOSCOPY (EGD) WITH PROPOFOL N/A 11/08/2020   Procedure: ESOPHAGOGASTRODUODENOSCOPY (EGD) WITH PROPOFOL;  Surgeon: Thornton Park, MD;  Location: [place];  Service: Gastroenterology;  Laterality: N/A;   GASTRIC MOTILITY STUDY  11/13/2007   mildly delayed emptying subjectively, but normal  analysis-77% of tracer emptied at 2 hours   INTRAVASCULAR PRESSURE WIRE/FFR STUDY N/A 08/04/2021   Procedure: INTRAVASCULAR PRESSURE WIRE/FFR STUDY;  Surgeon: Nelva Bush, MD;  Location: [place] CV LAB;  Service: Cardiovascular;  Laterality: N/A;   JOINT REPLACEMENT     LAPAROSCOPIC APPENDECTOMY N/A 09/15/2012   Procedure: APPENDECTOMY LAPAROSCOPIC;  Surgeon: [name], MD;  Location: [facility];  Service: General;  Laterality: N/A;   LEFT HEART CATH AND CORONARY ANGIOGRAPHY N/A 08/04/2021   Procedure: LEFT HEART CATH AND CORONARY ANGIOGRAPHY;  Surgeon: Nelva Bush, MD;  Location: [place] CV LAB;  Service: Cardiovascular;  Laterality:  N/A;   LEFT HEART CATHETERIZATION WITH CORONARY ANGIOGRAM N/A 01/05/2014   Procedure: LEFT HEART CATHETERIZATION WITH CORONARY ANGIOGRAM;  Surgeon: [name], MD;  Location: [facility] CATH LAB;  Service: Cardiovascular;  Laterality: N/A;   REPLACEMENT TOTAL KNEE Right 05-03-06   TEE WITHOUT CARDIOVERSION N/A 03/17/2014   Procedure: TRANSESOPHAGEAL ECHOCARDIOGRAM (TEE);  Surgeon: [name], MD;  Location: [facility] ENDOSCOPY;  Service: Cardiovascular;  Laterality: N/A;    TONSILLECTOMY  1972    Current Outpatient Medications  Medication Sig Dispense Refill   amiodarone (PACERONE) 200 MG tablet Take 1 tablet (200 mg total) by mouth daily. Start taking from 12/23/21 30 tablet 2   amLODipine (NORVASC) 5 MG tablet Take 5 mg by mouth daily.     atorvastatin (LIPITOR) 80 MG tablet Take 1 tablet (80 mg total) by mouth every evening. 90 tablet 3   B-D ULTRAFINE III SHORT PEN 31G X 8 MM MISC SMARTSIG:Injection Daily     BD INSULIN SYRINGE U/F 31G X 5/16" 0.5 ML MISC Use to take with Novolog TIDAC 100 each 5   carvedilol (COREG) 3.125 MG tablet Take 3.125 mg by mouth 2 (two) times daily with a meal.     clopidogrel (PLAVIX) 75 MG tablet Take 1 tablet (75 mg total) by mouth daily. 30 tablet 1   colesevelam (WELCHOL) 625 MG tablet Take 1,875 mg by mouth 2 (two) times daily with a meal. Noon and night     Continuous Blood Gluc Receiver (FREESTYLE LIBRE 2 READER) DEVI 1 each by Does not apply route daily in the afternoon. 1 each 0   Continuous Blood Gluc Sensor (FREESTYLE LIBRE 2 SENSOR) MISC 1 each by Does not apply route daily in the afternoon. 1 each 0   ELIQUIS 5 MG TABS tablet TAKE (1) TABLET BY MOUTH TWICE DAILY. 180 tablet 1   empagliflozin (JARDIANCE) 10 MG TABS tablet Take 10 mg by mouth daily.     furosemide (LASIX) 40 MG tablet Take 1 tablet (40 mg total) by mouth daily. 90 tablet 3   Insulin Glargine (BASAGLAR KWIKPEN) 100 UNIT/ML Inject 10 Units into the skin at bedtime. (Patient taking differently: Inject 25 Units into the skin at bedtime.) 30 mL 0   nitroGLYCERIN (NITROSTAT) 0.4 MG SL tablet PLACE 1 TABLET UNDER THE TONGUE EVERY 5 MINUTES AS NEEDED FOR CHEST PAIN (UP TO 3 DOSES) (Patient taking differently: Place 0.4 mg under the tongue every 5 (five) minutes x 3 doses as needed for chest pain.) 25 tablet 5   NOVOLOG 100 UNIT/ML injection Inject 6-9 Units into the skin 3 (three) times daily with meals. Sliding Scale     ONETOUCH ULTRA test strip TESTING 3  TIMES DAILY     polyethylene glycol (MIRALAX / GLYCOLAX) 17 g packet Take 17 g by mouth daily. 14 each 0   potassium chloride SA (KLOR-CON M) 20 MEQ tablet Take 20 mEq by mouth daily.     sacubitril-valsartan (ENTRESTO) 97-103 MG Take 0.5 tablets by mouth 2 (two) times daily. Start 12/28/21-STOP lasix when started (Patient taking differently: Take 1 tablet by mouth 2 (two) times daily.) 60 tablet 3   metoCLOPramide (REGLAN) 5 MG tablet Take 1 tablet (5 mg total) by mouth every 8 (eight) hours as needed for up to 10 days for nausea. 30 tablet 0   Vitamin D, Cholecalciferol, 25 MCG (1000 UT) TABS Take 2,000 Units by mouth daily. (Patient not taking: Reported on 06/20/2022)     No current facility-administered medications for  this visit.    Allergies as of 06/20/2022 - Review Complete 06/20/2022  Allergen Reaction Noted   Penicillins Hives       Physical Exam: General:   Alert,  well-nourished, pleasant and cooperative in NAD Head:  Normocephalic and atraumatic. Eyes:  Sclera clear, no icterus.   Conjunctiva pink. Abdomen:  Soft, nontender, nondistended, normal bowel sounds, no rebound or guarding. No hepatosplenomegaly.   Neurologic:  Alert and  oriented x4;  grossly nonfocal Skin:  Intact without significant lesions or rashes. Psych:  Alert and cooperative. Normal mood and affect.     Griffen Frayne L. Tarri Glenn, MD, MPH 06/24/2022, 1:45 PM

## 2022-06-20 NOTE — Patient Instructions (Addendum)
If you are age 77 or older, your body mass index should be between 23-30. Your Body mass index is 27.52 kg/m. If this is out of the aforementioned range listed, please consider follow up with your Primary Care Provider.  If you are age 29 or younger, your body mass index should be between 19-25. Your Body mass index is 27.52 kg/m. If this is out of the aformentioned range listed, please consider follow up with your Primary Care Provider.   ________________________________________________________  The [place] [facility] providers would like to encourage you to use [facility] to communicate with providers for non-urgent requests or questions.  Due to long hold times on the telephone, sending your provider a message by [facility] may be a faster and more efficient way to get a response.  Please allow 48 business hours for a response.  Please remember that this is for non-urgent requests.  _______________________________________________________   Due to recent changes in healthcare laws, you may see the results of your imaging and laboratory studies on MyChart before your provider has had a chance to review them.  We understand that in some cases there may be results that are confusing or concerning to you. Not all laboratory results come back in the same time frame and the provider may be waiting for multiple results in order to interpret others.  Please give [place] 48 hours in order for your provider to thoroughly review all the results before contacting the office for clarification of your results.    Thank you for trusting me with your gastrointestinal care!    Thornton Park, MD, MPH

## 2022-06-21 ENCOUNTER — Encounter: Payer: Medicare HMO | Admitting: Gastroenterology

## 2022-06-21 NOTE — Telephone Encounter (Signed)
Patient with diagnosis of afib on Eliquis for anticoagulation.    Procedure: endoscopy Date of procedure: TBD  CHA2DS2-VASc Score = 9  This indicates a 12.2% annual risk of stroke. The patient's score is based upon: CHF History: 1 HTN History: 1 Diabetes History: 1 Stroke History: 2 Vascular Disease History: 1 Age Score: 2 Gender Score: 1   12/2016 discharge summary - "She has undergone stroke evaluation. MRI was negative for acute infarct. It is possible that she has a transient ischemic attack."  CrCl 43m/min Platelet count 170K  Per office protocol, patient can hold Eliquis  for 1-2 days prior to procedure. Resume as soon as safely possible after given her elevated cardiac risk.  **This guidance is not considered finalized until pre-operative APP has relayed final recommendations.**

## 2022-06-21 NOTE — Telephone Encounter (Signed)
   Patient Name: CHYNNA BUERKLE  DOB: Sep 03, 1945 MRN: [id number]  Primary Cardiologist: [name], MD  Chart reviewed as part of pre-operative protocol coverage. Given past medical history and time since last visit, based on ACC/AHA guidelines, SYDNY SCHNITZLER is at acceptable risk for the planned procedure without further cardiovascular testing.   Per office protocol, Eliquis can be held 1-2 days prior to procedure. Per Dr. Debara Pickett, primary cardiologist, she may hold Plavix 5 days prior to procedure. Please resume Eliquis and Plavix as soon as possible post procedure.   I will route this recommendation to the requesting party via Epic fax function and remove from pre-op pool.  Please call with questions.  Trudi Ida, NP 06/21/2022, 4:38 PM

## 2022-06-21 NOTE — Telephone Encounter (Signed)
Ok to hold Plavix 5 days prior to procedure and restart post-op.   Dr. Debara Pickett

## 2022-06-22 ENCOUNTER — Other Ambulatory Visit: Payer: Self-pay

## 2022-06-22 DIAGNOSIS — R112 Nausea with vomiting, unspecified: Secondary | ICD-10-CM

## 2022-06-22 NOTE — Telephone Encounter (Signed)
Contacted patient and pt is aware and understanding that it was ok to hold Eliquis for 2 days and Plavix for 5 days per cardiology.

## 2022-06-23 DIAGNOSIS — E1051 Type 1 diabetes mellitus with diabetic peripheral angiopathy without gangrene: Secondary | ICD-10-CM | POA: Diagnosis not present

## 2022-06-23 DIAGNOSIS — L84 Corns and callosities: Secondary | ICD-10-CM | POA: Diagnosis not present

## 2022-06-23 DIAGNOSIS — I739 Peripheral vascular disease, unspecified: Secondary | ICD-10-CM | POA: Diagnosis not present

## 2022-06-23 DIAGNOSIS — L603 Nail dystrophy: Secondary | ICD-10-CM | POA: Diagnosis not present

## 2022-06-24 ENCOUNTER — Encounter: Payer: Self-pay | Admitting: Gastroenterology

## 2022-07-20 ENCOUNTER — Telehealth: Payer: Self-pay | Admitting: Internal Medicine

## 2022-07-20 MED ORDER — SACUBITRIL-VALSARTAN 97-103 MG PO TABS: 0.5000 | ORAL_TABLET | Freq: Two times a day (BID) | ORAL | 2 refills | Status: DC

## 2022-07-20 NOTE — Telephone Encounter (Signed)
*  STAT* If patient is at the pharmacy, call can be transferred to refill team.   1. Which medications need to be refilled? (please list name of each medication and dose if known)   sacubitril-valsartan (ENTRESTO) 97-103 MG    2. Which pharmacy/location (including street and city if local pharmacy) is medication to be sent to? [place]   3. Do they need a 30 day or 90 day supply?  90 day

## 2022-07-25 ENCOUNTER — Encounter: Payer: Medicare HMO | Admitting: Gastroenterology

## 2022-08-02 ENCOUNTER — Inpatient Hospital Stay (HOSPITAL_COMMUNITY): Admission: RE | Admit: 2022-08-02 | Payer: Medicare HMO | Source: Ambulatory Visit

## 2022-08-14 ENCOUNTER — Other Ambulatory Visit (HOSPITAL_COMMUNITY): Payer: Self-pay | Admitting: Internal Medicine

## 2022-08-14 DIAGNOSIS — L600 Ingrowing nail: Secondary | ICD-10-CM | POA: Diagnosis not present

## 2022-08-14 DIAGNOSIS — M2041 Other hammer toe(s) (acquired), right foot: Secondary | ICD-10-CM | POA: Diagnosis not present

## 2022-08-14 DIAGNOSIS — Z9889 Other specified postprocedural states: Secondary | ICD-10-CM

## 2022-08-18 ENCOUNTER — Ambulatory Visit (HOSPITAL_COMMUNITY)
Admission: RE | Admit: 2022-08-18 | Discharge: 2022-08-18 | Disposition: A | Discharge status: Home / Self Care | Payer: Medicare HMO | Source: Ambulatory Visit | Attending: Cardiovascular Disease | Admitting: Cardiovascular Disease

## 2022-08-18 DIAGNOSIS — Z9889 Other specified postprocedural states: Secondary | ICD-10-CM | POA: Diagnosis not present

## 2022-08-18 DIAGNOSIS — H538 Other visual disturbances: Secondary | ICD-10-CM | POA: Insufficient documentation

## 2022-08-22 ENCOUNTER — Encounter: Payer: Self-pay | Admitting: Internal Medicine

## 2022-08-22 ENCOUNTER — Other Ambulatory Visit: Payer: Self-pay | Admitting: Internal Medicine

## 2022-08-22 DIAGNOSIS — Z9889 Other specified postprocedural states: Secondary | ICD-10-CM

## 2022-09-07 DIAGNOSIS — D518 Other vitamin B12 deficiency anemias: Secondary | ICD-10-CM | POA: Diagnosis not present

## 2022-09-07 DIAGNOSIS — E039 Hypothyroidism, unspecified: Secondary | ICD-10-CM | POA: Diagnosis not present

## 2022-09-07 DIAGNOSIS — E559 Vitamin D deficiency, unspecified: Secondary | ICD-10-CM | POA: Diagnosis not present

## 2022-09-07 DIAGNOSIS — Z0001 Encounter for general adult medical examination with abnormal findings: Secondary | ICD-10-CM | POA: Diagnosis not present

## 2022-09-13 DIAGNOSIS — I509 Heart failure, unspecified: Secondary | ICD-10-CM | POA: Diagnosis not present

## 2022-09-13 DIAGNOSIS — I129 Hypertensive chronic kidney disease with stage 1 through stage 4 chronic kidney disease, or unspecified chronic kidney disease: Secondary | ICD-10-CM | POA: Diagnosis not present

## 2022-09-13 DIAGNOSIS — N1832 Chronic kidney disease, stage 3b: Secondary | ICD-10-CM | POA: Diagnosis not present

## 2022-09-13 DIAGNOSIS — N2581 Secondary hyperparathyroidism of renal origin: Secondary | ICD-10-CM | POA: Diagnosis not present

## 2022-09-13 DIAGNOSIS — D631 Anemia in chronic kidney disease: Secondary | ICD-10-CM | POA: Diagnosis not present

## 2022-09-14 LAB — LAB REPORT - SCANNED: EGFR: 26

## 2022-10-18 ENCOUNTER — Other Ambulatory Visit: Payer: Self-pay | Admitting: Internal Medicine

## 2022-10-18 MED ORDER — CARVEDILOL 3.125 MG PO TABS: 3.1250 mg | ORAL_TABLET | Freq: Two times a day (BID) | ORAL | 2 refills | Status: DC

## 2022-10-18 MED ORDER — AMLODIPINE BESYLATE 5 MG PO TABS: 5.0000 mg | ORAL_TABLET | Freq: Every day | ORAL | 2 refills | Status: DC

## 2022-10-18 NOTE — Telephone Encounter (Signed)
*  STAT* If patient is at the pharmacy, call can be transferred to refill team.   1. Which medications need to be refilled? (please list name of each medication and dose if known)   sacubitril-valsartan (ENTRESTO) 97-103 MG    carvedilol (COREG) 3.125 MG tablet  amLODipine (NORVASC) 5 MG tablet   2. Which pharmacy/location (including street and city if local pharmacy) is medication to be sent to? [facility] - [place], [place] - [address]   3. Do they need a 30 day or 90 day supply? 90

## 2022-10-19 MED ORDER — SACUBITRIL-VALSARTAN 97-103 MG PO TABS: 0.5000 | ORAL_TABLET | Freq: Two times a day (BID) | ORAL | 2 refills | Status: DC

## 2022-11-09 DIAGNOSIS — E113412 Type 2 diabetes mellitus with severe nonproliferative diabetic retinopathy with macular edema, left eye: Secondary | ICD-10-CM | POA: Diagnosis not present

## 2022-11-09 DIAGNOSIS — E113411 Type 2 diabetes mellitus with severe nonproliferative diabetic retinopathy with macular edema, right eye: Secondary | ICD-10-CM | POA: Diagnosis not present

## 2022-11-09 DIAGNOSIS — Z794 Long term (current) use of insulin: Secondary | ICD-10-CM | POA: Diagnosis not present

## 2022-11-24 ENCOUNTER — Ambulatory Visit: Payer: Medicare HMO | Admitting: Adult Health

## 2022-11-30 DIAGNOSIS — I2510 Atherosclerotic heart disease of native coronary artery without angina pectoris: Secondary | ICD-10-CM | POA: Diagnosis not present

## 2022-11-30 DIAGNOSIS — I4891 Unspecified atrial fibrillation: Secondary | ICD-10-CM | POA: Diagnosis not present

## 2022-11-30 DIAGNOSIS — E663 Overweight: Secondary | ICD-10-CM | POA: Diagnosis not present

## 2022-11-30 DIAGNOSIS — I10 Essential (primary) hypertension: Secondary | ICD-10-CM | POA: Diagnosis not present

## 2022-11-30 DIAGNOSIS — E1129 Type 2 diabetes mellitus with other diabetic kidney complication: Secondary | ICD-10-CM | POA: Diagnosis not present

## 2022-11-30 DIAGNOSIS — Z6826 Body mass index (BMI) 26.0-26.9, adult: Secondary | ICD-10-CM | POA: Diagnosis not present

## 2022-12-04 ENCOUNTER — Encounter: Payer: Self-pay | Admitting: Internal Medicine

## 2022-12-04 ENCOUNTER — Ambulatory Visit: Payer: Medicare HMO | Admitting: Internal Medicine

## 2022-12-04 VITALS — BP 120/72 | HR 61 | Ht 66.0 in | Wt 172.0 lb

## 2022-12-04 DIAGNOSIS — Z794 Long term (current) use of insulin: Secondary | ICD-10-CM | POA: Diagnosis not present

## 2022-12-04 DIAGNOSIS — E1159 Type 2 diabetes mellitus with other circulatory complications: Secondary | ICD-10-CM

## 2022-12-04 DIAGNOSIS — E1122 Type 2 diabetes mellitus with diabetic chronic kidney disease: Secondary | ICD-10-CM | POA: Diagnosis not present

## 2022-12-04 DIAGNOSIS — E1142 Type 2 diabetes mellitus with diabetic polyneuropathy: Secondary | ICD-10-CM

## 2022-12-04 DIAGNOSIS — N1832 Chronic kidney disease, stage 3b: Secondary | ICD-10-CM

## 2022-12-04 LAB — POCT GLYCOSYLATED HEMOGLOBIN (HGB A1C): Hemoglobin A1C: 8.5 % — AB (ref 4.0–5.6)

## 2022-12-04 MED ORDER — NOVOLOG FLEXPEN 100 UNIT/ML ~~LOC~~ SOPN: PEN_INJECTOR | SUBCUTANEOUS | 3 refills | Status: DC

## 2022-12-04 MED ORDER — SEMAGLUTIDE(0.25 OR 0.5MG/DOS) 2 MG/3ML ~~LOC~~ SOPN: 0.5000 mg | PEN_INJECTOR | SUBCUTANEOUS | 11 refills | Status: DC

## 2022-12-04 MED ORDER — BASAGLAR KWIKPEN 100 UNIT/ML ~~LOC~~ SOPN: 28.0000 [IU] | PEN_INJECTOR | Freq: Every day | SUBCUTANEOUS | 3 refills | Status: DC

## 2022-12-04 MED ORDER — EMPAGLIFLOZIN 10 MG PO TABS: 10.0000 mg | ORAL_TABLET | Freq: Every day | ORAL | 3 refills | Status: DC

## 2022-12-04 MED ORDER — INSULIN PEN NEEDLE 32G X 4 MM MISC: 1.0000 | Freq: Four times a day (QID) | 3 refills | Status: DC

## 2022-12-04 NOTE — Progress Notes (Signed)
Name: Cassandra Adams  MRN/ DOB: [id number], 08/13/1945   Age/ Sex: 77 y.o., female    PCP: Assunta Found, MD   Reason for Endocrinology Evaluation: Type 2 Diabetes Mellitus     Date of Initial Endocrinology Visit: 12/04/2022     PATIENT IDENTIFIER: Cassandra Adams is a 77 y.o. female with a past medical history of DM, HTN, A-fib, CHF, CAD. The patient presented for initial endocrinology clinic visit on 12/04/2022 for consultative assistance with her diabetes management.    HPI: Cassandra Adams is accompanied by her son   Diagnosed with DM years ago  Prior Medications tried/Intolerance: Limited glycemic agents due to low GFR Currently checking blood sugars 3 x / day.  Hypoglycemia episodes : yes               Symptoms: yes                 Frequency: rare  Hemoglobin A1c has ranged from 8.1% in 2022, peaking at 11.0%   In terms of diet, the patient 3 meals a day, does not snack, avoids sugar-sweetened beverages    Last week she had nausea  and vomiting last week  Denies constipation or diarrhea     HOME DIABETES REGIMEN: Jardiance 10 mg daily Basaglar 25 units daily in the morning  NovoLog    100-150 = 6 units  151- 200 = 7 units   Statin: Yes ACE-I/ARB: Yes    METER DOWNLOAD SUMMARY: Date range evaluated: 5/12-6/03/2023 Fingerstick Blood Glucose Tests = 84 Average Number Tests/Day = 3 Overall Mean FS Glucose = 219 Standard Deviation = 98  BG Ranges: Low = 62 High = 509   Hypoglycemic Events/30 Days: BG < 50 = 0 Episodes of symptomatic severe hypoglycemia = 0   DIABETIC COMPLICATIONS: Microvascular complications:  CKD III, neuropathy Denies:  Last eye exam: Completed 11/09/2022  Macrovascular complications:  CAD, CHF Denies: PVD, CVA   PAST HISTORY: Past Medical History:  Past Medical History:  Diagnosis Date   (HFpEF) heart failure with preserved ejection fraction (HCC)    Limited Echo 7/22: Mild asymmetrical LVH due to scar and  thinning of basal posterior wall and background conc LVH, apical and mid segments hyperdynamic, EF 60-65, basal inf-lat and basal inf AK, normal RVSF, RVSP 26.6, mild LAE, mild-moderate MR, mild AV sclerosis w/o AS // Echo 5/22: EF 55-60, RVSP 27, mild to mod LAE, poss small to mod eff; AV sclerosis no AS   Arthritis    "all over"   CAD (coronary artery disease)    a. NSTEMI 12/2013 - occluded dominant RCA with L-R collaterals, moderate prox segmental LAD disease, for medical therapy initially, EF 60% with subtle inferobasal hypokinesia. Consider PCI for refractory CP.   CKD (chronic kidney disease), stage III (HCC)    Hypertension    Migraines    "weekly" (01/06/2014)   Sickle cell trait (HCC)    TIA (transient ischemic attack) 1978   Type II diabetes mellitus (HCC)    Vertigo    Past Surgical History:  Past Surgical History:  Procedure Laterality Date   APPENDECTOMY  March 2014   had appendix frozen   CARDIAC CATHETERIZATION  "years ago" & 01/05/2014   CARDIOVERSION N/A 03/17/2014   Procedure: CARDIOVERSION;  Surgeon: Chrystie Nose, MD;  Location: [facility] ENDOSCOPY;  Service: Cardiovascular;  Laterality: N/A;   CARDIOVERSION N/A 01/14/2021   Procedure: CARDIOVERSION;  Surgeon: Chrystie Nose, MD;  Location: [facility] ENDOSCOPY;  Service: Cardiovascular;  Laterality: N/A;   CARDIOVERSION N/A 12/13/2021   Procedure: CARDIOVERSION;  Surgeon: Elease Hashimoto Deloris Ping, MD;  Location: [facility] ENDOSCOPY;  Service: Cardiovascular;  Laterality: N/A;   CATARACT EXTRACTION W/ INTRAOCULAR LENS  IMPLANT, BILATERAL Bilateral 04/2013-05/2013   CORONARY ATHERECTOMY N/A 08/05/2021   Procedure: CORONARY ATHERECTOMY;  Surgeon: Tonny Bollman, MD;  Location[facility] INVASIVE CV LAB;  Service: Cardiovascular;  Laterality: N/A;   CORONARY PRESSURE/FFR STUDY N/A 08/04/2021   Procedure: INTRAVASCULAR PRESSURE WIRE/FFR STUDY;  Surgeon: Yvonne Kendall, MD;  Location: [facility] INVASIVE CV LAB;  Service: Cardiovascular;  Laterality: N/A;    CORONARY STENT INTERVENTION N/A 08/05/2021   Procedure: CORONARY STENT INTERVENTION;  Surgeon: Tonny Bollman, MD;  Location: [facility] INVASIVE CV LAB;  Service: Cardiovascular;  Laterality: N/A;   ENDARTERECTOMY Right 02/05/2017   Procedure: ENDARTERECTOMY CAROTID-RIGHT;  Surgeon: Sherren Kerns, MD;  Location: [facility] OR;  Service: Vascular;  Laterality: Right;   ESOPHAGOGASTRODUODENOSCOPY  11/08/2007    Normal esophagus without evidence of Barrett, mass, erosion/ Normal stomach, duodenal bulb   ESOPHAGOGASTRODUODENOSCOPY (EGD) WITH PROPOFOL N/A 11/08/2020   Procedure: ESOPHAGOGASTRODUODENOSCOPY (EGD) WITH PROPOFOL;  Surgeon: Tressia Danas, MD;  Location: [facility] ENDOSCOPY;  Service: Gastroenterology;  Laterality: N/A;   GASTRIC MOTILITY STUDY  11/13/2007   mildly delayed emptying subjectively, but normal  analysis-77% of tracer emptied at 2 hours   JOINT REPLACEMENT     LAPAROSCOPIC APPENDECTOMY N/A 09/15/2012   Procedure: APPENDECTOMY LAPAROSCOPIC;  Surgeon: Dalia Heading, MD;  Location: [facility] ORS;  Service: General;  Laterality: N/A;   LEFT HEART CATH AND CORONARY ANGIOGRAPHY N/A 08/04/2021   Procedure: LEFT HEART CATH AND CORONARY ANGIOGRAPHY;  Surgeon: Yvonne Kendall, MD;  Location: [facility] INVASIVE CV LAB;  Service: Cardiovascular;  Laterality: N/A;   LEFT HEART CATHETERIZATION WITH CORONARY ANGIOGRAM N/A 01/05/2014   Procedure: LEFT HEART CATHETERIZATION WITH CORONARY ANGIOGRAM;  Surgeon: Runell Gess, MD;  Location: [facility] CATH LAB;  Service: Cardiovascular;  Laterality: N/A;   REPLACEMENT TOTAL KNEE Right 05-03-06   TEE WITHOUT CARDIOVERSION N/A 03/17/2014   Procedure: TRANSESOPHAGEAL ECHOCARDIOGRAM (TEE);  Surgeon: Chrystie Nose, MD;  Location: [facility] ENDOSCOPY;  Service: Cardiovascular;  Laterality: N/A;   TONSILLECTOMY  1972    Social History:  reports that she has never smoked. She has never used smokeless tobacco. She reports current alcohol use. She reports that she does not use drugs. Family  History:  Family History  Problem Relation Age of Onset   Hyperlipidemia Mother    Hypertension Mother    Heart attack Mother    Cancer Father        lung   Hypertension Sister    Diabetes Brother    Heart disease Brother    Hyperlipidemia Brother    Hypertension Brother    Heart attack Brother    Other Brother        DVT     HOME MEDICATIONS: Allergies as of 12/04/2022       Reactions   Penicillins Hives   Has patient had a PCN reaction causing immediate rash, facial/tongue/throat swelling, SOB or lightheadedness with hypotension: no Has patient had a PCN reaction causing severe rash involving mucus membranes or skin necrosis: No  Has patient had a PCN reaction that required hospitalization: no Has patient had a PCN reaction occurring within the last 10 years: no If all of the above answers are "NO", then may proceed with Cephalosporin use.        Medication List        Accurate  as of December 04, 2022  1:02 PM. If you have any questions, ask your nurse or doctor.          STOP taking these medications    potassium chloride SA 20 MEQ tablet Commonly known as: KLOR-CON M Stopped by: Scarlette Shorts, MD   Vitamin D (Cholecalciferol) 25 MCG (1000 UT) Tabs Stopped by: Scarlette Shorts, MD       TAKE these medications    amiodarone 200 MG tablet Commonly known as: Pacerone Take 1 tablet (200 mg total) by mouth daily. Start taking from 12/23/21   amLODipine 5 MG tablet Commonly known as: NORVASC Take 1 tablet (5 mg total) by mouth daily.   atorvastatin 80 MG tablet Commonly known as: LIPITOR Take 1 tablet (80 mg total) by mouth every evening.   B-D ULTRAFINE III SHORT PEN 31G X 8 MM Misc Generic drug: Insulin Pen Needle SMARTSIG:Injection Daily   Basaglar KwikPen 100 UNIT/ML Inject 10 Units into the skin at bedtime. What changed: how much to take   BD Insulin Syringe U/F 31G X 5/16" 0.5 ML Misc Generic drug: Insulin Syringe-Needle U-100 Use  to take with Novolog TIDAC   carvedilol 3.125 MG tablet Commonly known as: COREG Take 1 tablet (3.125 mg total) by mouth 2 (two) times daily with a meal.   clopidogrel 75 MG tablet Commonly known as: PLAVIX Take 1 tablet (75 mg total) by mouth daily.   colesevelam 625 MG tablet Commonly known as: WELCHOL Take 1,875 mg by mouth 2 (two) times daily with a meal. Noon and night   Eliquis 5 MG Tabs tablet Generic drug: apixaban TAKE (1) TABLET BY MOUTH TWICE DAILY.   FreeStyle Libre 2 Reader [place] 1 each by Does not apply route daily in the afternoon.   FreeStyle Libre 2 Sensor Misc 1 each by Does not apply route daily in the afternoon.   furosemide 40 MG tablet Commonly known as: LASIX Take 1 tablet (40 mg total) by mouth daily.   isosorbide mononitrate 60 MG 24 hr tablet Commonly known as: IMDUR Take 60 mg by mouth daily.   Jardiance 10 MG Tabs tablet Generic drug: empagliflozin Take 10 mg by mouth daily.   metoCLOPramide 5 MG tablet Commonly known as: Reglan Take 1 tablet (5 mg total) by mouth every 8 (eight) hours as needed for up to 10 days for nausea.   nitroGLYCERIN 0.4 MG SL tablet Commonly known as: NITROSTAT PLACE 1 TABLET UNDER THE TONGUE EVERY 5 MINUTES AS NEEDED FOR CHEST PAIN (UP TO 3 DOSES) What changed: See the new instructions.   NovoLOG 100 UNIT/ML injection Generic drug: insulin aspart Inject 6-9 Units into the skin 3 (three) times daily with meals. Sliding Scale   OneTouch Ultra test strip Generic drug: glucose blood TESTING 3 TIMES DAILY   polyethylene glycol 17 g packet Commonly known as: MIRALAX / GLYCOLAX Take 17 g by mouth daily.   sacubitril-valsartan 97-103 MG Commonly known as: ENTRESTO Take 0.5 tablets by mouth 2 (two) times daily. Start 12/28/21-STOP lasix when started         ALLERGIES: Allergies  Allergen Reactions   Penicillins Hives    Has patient had a PCN reaction causing immediate rash, facial/tongue/throat swelling,  SOB or lightheadedness with hypotension: no Has patient had a PCN reaction causing severe rash involving mucus membranes or skin necrosis: No  Has patient had a PCN reaction that required hospitalization: no Has patient had a PCN reaction occurring within the last 10  years: no If all of the above answers are "NO", then may proceed with Cephalosporin use.      REVIEW OF SYSTEMS: A comprehensive ROS was conducted with the patient and is negative except as per HPI    OBJECTIVE:   VITAL SIGNS: BP 120/72 (BP Location: Left Arm, Patient Position: Sitting, Cuff Size: Large)   Pulse 61   Ht 5\' 6"  (1.676 m)   Wt 172 lb (78 kg)   SpO2 96%   BMI 27.76 kg/m    PHYSICAL EXAM:  General: Pt appears well and is in NAD  Neck: General: Supple without adenopathy or carotid bruits. Thyroid: Thyroid size normal.  No goiter or nodules appreciated.   Lungs: Clear with good BS bilat   Heart: RRR   Abdomen:  soft, nontender  Extremities:  Lower extremities - No pretibial edema.   Neuro: MS is good with appropriate affect, pt is alert and Ox3    DM foot exam: 12/04/2022  The skin of the feet is without sores or ulcerations The pedal pulses are 2+ on right and 2+ on left. The sensation is absent to a screening 5.07, 10 gram monofilament bilaterally    DATA REVIEWED:  Lab Results  Component Value Date   HGBA1C 11.5 (H) 04/11/2022   HGBA1C 8.1 (H) 06/11/2021   HGBA1C 8.4 (A) 02/16/2021    09/07/2022 BUN 26 CR 1.68 GFR 31 Ca 9.9 TG 101 HDL 83 LDL 86 TSH 0.926 Vitamin D 33.7   Old records , labs and images have been reviewed.   ASSESSMENT / PLAN / RECOMMENDATIONS:   1) Type 2 Diabetes Mellitus, poorly controlled, With CKD III , neuropathic and macrovascular complications - Most recent A1c of 8.5%. Goal A1c < 7.0 %.     -Patient with CKD, and neuropathic complications -In reviewing glucose meter download, she has been noted with glycemic excursions ranging from 60-500 mg/dL   -Patient assures me compliance with insulin intake, I was questioning adherence with severe glycemic excursions, but she does consume sugar sweetened beverages, which I have advised her to avoid -I will adjust her insulin as below -She will be provided with a correction scale as well -She is already on Jardiance -I have recommended adding GLP-1 agonist due to renal and vascular benefits, cautioned against GI side effects -I have also discussed CGM technology, she was provided with a sensor and a receiver sample of Dexcom G7, I did let the patient know that I would not prescribe it for her yet unless she lets me know that she is interested in it  MEDICATIONS: Start Ozempic 0.25 mg once weekly for 6 weeks, then increase to 0.5 mg weekly Continue Jardiance 10 mg daily Increase Basaglar 28 units daily NovoLog 4 units 3 times daily before every meal Start CF : NovoLog (BG -130/30)  EDUCATION / INSTRUCTIONS: BG monitoring instructions: Patient is instructed to check her blood sugars 3 times a day. Call [facility] if: BG persistently < 70  I reviewed the Rule of 15 for the treatment of hypoglycemia in detail with the patient. Literature supplied.   2) Diabetic complications:  Eye: Does  have known diabetic retinopathy.  Neuro/ Feet: Does  have known diabetic peripheral neuropathy. Renal: Patient does  have known baseline CKD. She is under  the care under the care of nephrology        Signed electronically by: Lyndle Herrlich, MD  [facility]  [facility] [address] [place]  [place], [place] [address] Phone: [phone] FAX: [phone]   CC: Assunta Found, MD [address] [place] [address] Phone: [phone]  Fax: [phone]    Return to Endocrinology clinic as below: Future Appointments  Date Time Provider Department Center  12/29/2022  3:10 PM Jodelle Gross, NP [facility] None

## 2022-12-04 NOTE — Patient Instructions (Signed)
Start Ozempic 0.25 mg weekly for 6 weeks, than increase to 0.5 mg weekly  Continue Jardiance 10 mg daily Increase Basaglar to 28 units once daily Novolog correctional insulin Use the scale below to help guide you BEFORE each meal   Blood sugar before meal Number of units to inject  Less than 80 0 unit  81 -  160 4 units  161 - 190 2 units  191 - 220 3 units  221 - 250 4 units  251 -  280 5 units  281 -  310 6 units  311 -  340 7 units  341 -  370 8 units  371 - 400 9 units   HOW TO TREAT LOW BLOOD SUGARS (Blood sugar LESS THAN 70 MG/DL) Please follow the RULE OF 15 for the treatment of hypoglycemia treatment (when your (blood sugars are less than 70 mg/dL)   STEP 1: Take 15 grams of carbohydrates when your blood sugar is low, which includes:  3-4 GLUCOSE TABS  OR 3-4 OZ OF JUICE OR REGULAR SODA OR ONE TUBE OF GLUCOSE GEL    STEP 2: RECHECK blood sugar in 15 MINUTES STEP 3: If your blood sugar is still low at the 15 minute recheck --> then, go back to STEP 1 and treat AGAIN with another 15 grams of carbohydrates.

## 2022-12-29 ENCOUNTER — Ambulatory Visit: Payer: Medicare HMO | Admitting: Adult Health

## 2023-01-03 NOTE — Progress Notes (Signed)
Cardiology Clinic Note   Patient Name: Cassandra Adams Date of Encounter: 01/05/2023  Primary Care Provider:  Assunta Found, MD Primary Cardiologist:  Cassandra Nose, MD  Patient Profile    77 year old female with hx of CAD, status post PCI to the RCA in 2015, recent chest pain and PCI to the LAD with chronic total occluded RCA (07/2021), AF - CHADSVASC score of 5 on Eliquis, Hypertension , Dyslipidemia, IDDM with neuropathy, poorly controlled with hyperglycemic admissions, Carotid artery stenosis/occlusion, mild 2016   Past Medical History    Past Medical History:  Diagnosis Date   (HFpEF) heart failure with preserved ejection fraction (HCC)    Limited Echo 7/22: Mild asymmetrical LVH due to scar and thinning of basal posterior wall and background conc LVH, apical and mid segments hyperdynamic, EF 60-65, basal inf-lat and basal inf AK, normal RVSF, RVSP 26.6, mild LAE, mild-moderate MR, mild AV sclerosis w/o AS // Echo 5/22: EF 55-60, RVSP 27, mild to mod LAE, poss small to mod eff; AV sclerosis no AS   Arthritis    "all over"   CAD (coronary artery disease)    a. NSTEMI 12/2013 - occluded dominant RCA with L-R collaterals, moderate prox segmental LAD disease, for medical therapy initially, EF 60% with subtle inferobasal hypokinesia. Consider PCI for refractory CP.   CKD (chronic kidney disease), stage III (HCC)    Hypertension    Migraines    "weekly" (01/06/2014)   Sickle cell trait (HCC)    TIA (transient ischemic attack) 1978   Type II diabetes mellitus (HCC)    Vertigo    Past Surgical History:  Procedure Laterality Date   APPENDECTOMY  March 2014   had appendix frozen   CARDIAC CATHETERIZATION  "years ago" & 01/05/2014   CARDIOVERSION N/A 03/17/2014   Procedure: CARDIOVERSION;  Surgeon: Cassandra Nose, MD;  Location: [facility] ENDOSCOPY;  Service: Cardiovascular;  Laterality: N/A;   CARDIOVERSION N/A 01/14/2021   Procedure: CARDIOVERSION;  Surgeon: Cassandra Nose, MD;   Location: [facility] ENDOSCOPY;  Service: Cardiovascular;  Laterality: N/A;   CARDIOVERSION N/A 12/13/2021   Procedure: CARDIOVERSION;  Surgeon: Cassandra Mixer, MD;  Location: [facility] ENDOSCOPY;  Service: Cardiovascular;  Laterality: N/A;   CATARACT EXTRACTION W/ INTRAOCULAR LENS  IMPLANT, BILATERAL Bilateral 04/2013-05/2013   CORONARY ATHERECTOMY N/A 08/05/2021   Procedure: CORONARY ATHERECTOMY;  Surgeon: Cassandra Bollman, MD;  Location: [facility] INVASIVE CV LAB;  Service: Cardiovascular;  Laterality: N/A;   CORONARY PRESSURE/FFR STUDY N/A 08/04/2021   Procedure: INTRAVASCULAR PRESSURE WIRE/FFR STUDY;  Surgeon: Cassandra Kendall, MD;  Location: [facility] INVASIVE CV LAB;  Service: Cardiovascular;  Laterality: N/A;   CORONARY STENT INTERVENTION N/A 08/05/2021   Procedure: CORONARY STENT INTERVENTION;  Surgeon: Cassandra Bollman, MD;  Location: [facility] INVASIVE CV LAB;  Service: Cardiovascular;  Laterality: N/A;   ENDARTERECTOMY Right 02/05/2017   Procedure: ENDARTERECTOMY CAROTID-RIGHT;  Surgeon: Cassandra Kerns, MD;  Location: [facility] OR;  Service: Vascular;  Laterality: Right;   ESOPHAGOGASTRODUODENOSCOPY  11/08/2007    Normal esophagus without evidence of Barrett, mass, erosion/ Normal stomach, duodenal bulb   ESOPHAGOGASTRODUODENOSCOPY (EGD) WITH PROPOFOL N/A 11/08/2020   Procedure: ESOPHAGOGASTRODUODENOSCOPY (EGD) WITH PROPOFOL;  Surgeon: Cassandra Danas, MD;  Location: [facility] ENDOSCOPY;  Service: Gastroenterology;  Laterality: N/A;   GASTRIC MOTILITY STUDY  11/13/2007   mildly delayed emptying subjectively, but normal  analysis-77% of tracer emptied at 2 hours   JOINT REPLACEMENT     LAPAROSCOPIC APPENDECTOMY N/A 09/15/2012   Procedure: APPENDECTOMY LAPAROSCOPIC;  Surgeon:  Cassandra Heading, MD;  Location: [facility] ORS;  Service: General;  Laterality: N/A;   LEFT HEART CATH AND CORONARY ANGIOGRAPHY N/A 08/04/2021   Procedure: LEFT HEART CATH AND CORONARY ANGIOGRAPHY;  Surgeon: Cassandra Kendall, MD;  Location: [facility] INVASIVE CV LAB;  Service:  Cardiovascular;  Laterality: N/A;   LEFT HEART CATHETERIZATION WITH CORONARY ANGIOGRAM N/A 01/05/2014   Procedure: LEFT HEART CATHETERIZATION WITH CORONARY ANGIOGRAM;  Surgeon: Cassandra Gess, MD;  Location: [facility] CATH LAB;  Service: Cardiovascular;  Laterality: N/A;   REPLACEMENT TOTAL KNEE Right 05-03-06   TEE WITHOUT CARDIOVERSION N/A 03/17/2014   Procedure: TRANSESOPHAGEAL ECHOCARDIOGRAM (TEE);  Surgeon: Cassandra Nose, MD;  Location: [facility] ENDOSCOPY;  Service: Cardiovascular;  Laterality: N/A;   TONSILLECTOMY  1972    Allergies  Allergies  Allergen Reactions   Penicillins Hives    Has patient had a PCN reaction causing immediate rash, facial/tongue/throat swelling, SOB or lightheadedness with hypotension: no Has patient had a PCN reaction causing severe rash involving mucus membranes or skin necrosis: No  Has patient had a PCN reaction that required hospitalization: no Has patient had a PCN reaction occurring within the last 10 years: no If all of the above answers are "NO", then may proceed with Cephalosporin use.     History of Present Illness    Mrs. [name] comes today with complaints of lower extremity edema, and edema of her hands and abdomen.  The patient admits to eating some salty foods during July 4 to include hot dogs, meatballs, and salty snacks.  Her normal weight at home was around 162 pounds and she went up to 173 pounds at home.  She normally takes furosemide 40 mg daily, but increase the dose to 40 mg twice daily for the last 2 days with some improvement in her symptoms.  Her weight has come down to 170 pounds.  She continues to have some mild abdominal distention although not uncomfortable, some lower extremity edema but has improved, and no edema in her hands.  She has seen her primary care provider who is also noted that her hemoglobin A1c was 11.  She was placed on Ozempic 2 mg, 0.5 mg injection to the skin once a week but has not started it yet until checking with  cardiology.  She continues to take Entresto 97 103 daily, isosorbide 60 mg daily.  She denies any chest pain, significant dyspnea on exertion with some diuresis from the Lasix, no palpitations or muscle cramping.  Home Medications    Current Outpatient Medications  Medication Sig Dispense Refill   amiodarone (PACERONE) 200 MG tablet Take 1 tablet (200 mg total) by mouth daily. Start taking from 12/23/21 30 tablet 2   amLODipine (NORVASC) 5 MG tablet Take 1 tablet (5 mg total) by mouth daily. 90 tablet 2   atorvastatin (LIPITOR) 80 MG tablet Take 1 tablet (80 mg total) by mouth every evening. 90 tablet 3   BD INSULIN SYRINGE U/F 31G X 5/16" 0.5 ML MISC Use to take with Novolog TIDAC 100 each 5   carvedilol (COREG) 3.125 MG tablet Take 1 tablet (3.125 mg total) by mouth 2 (two) times daily with a meal. 180 tablet 2   clopidogrel (PLAVIX) 75 MG tablet Take 1 tablet (75 mg total) by mouth daily. 30 tablet 1   colesevelam (WELCHOL) 625 MG tablet Take 1,875 mg by mouth 2 (two) times daily with a meal. Noon and night     Continuous Blood Gluc Receiver (FREESTYLE LIBRE 2 READER) DEVI  1 each by Does not apply route daily in the afternoon. 1 each 0   Continuous Blood Gluc Sensor (FREESTYLE LIBRE 2 SENSOR) MISC 1 each by Does not apply route daily in the afternoon. 1 each 0   ELIQUIS 5 MG TABS tablet TAKE (1) TABLET BY MOUTH TWICE DAILY. 180 tablet 1   empagliflozin (JARDIANCE) 10 MG TABS tablet Take 1 tablet (10 mg total) by mouth daily. 90 tablet 3   insulin aspart (NOVOLOG FLEXPEN) 100 UNIT/ML FlexPen Max daily 30 units 30 mL 3   Insulin Glargine (BASAGLAR KWIKPEN) 100 UNIT/ML Inject 28 Units into the skin daily. 30 mL 3   Insulin Pen Needle 32G X 4 MM MISC 1 Device by Does not apply route in the morning, at noon, in the evening, and at bedtime. 400 each 3   isosorbide mononitrate (IMDUR) 60 MG 24 hr tablet Take 60 mg by mouth daily.     nitroGLYCERIN (NITROSTAT) 0.4 MG SL tablet PLACE 1 TABLET UNDER  THE TONGUE EVERY 5 MINUTES AS NEEDED FOR CHEST PAIN (UP TO 3 DOSES) (Patient taking differently: Place 0.4 mg under the tongue every 5 (five) minutes x 3 doses as needed for chest pain.) 25 tablet 5   ONETOUCH ULTRA test strip TESTING 3 TIMES DAILY     polyethylene glycol (MIRALAX / GLYCOLAX) 17 g packet Take 17 g by mouth daily. 14 each 0   sacubitril-valsartan (ENTRESTO) 97-103 MG Take 0.5 tablets by mouth 2 (two) times daily. Start 12/28/21-STOP lasix when started 60 tablet 2   furosemide (LASIX) 40 MG tablet Take 1.5 tablets (60 mg total) by mouth daily. 45 tablet 3   metoCLOPramide (REGLAN) 5 MG tablet Take 1 tablet (5 mg total) by mouth every 8 (eight) hours as needed for up to 10 days for nausea. 30 tablet 0   Semaglutide,0.25 or 0.5MG /DOS, 2 MG/3ML SOPN Inject 0.5 mg into the skin once a week. 3 mL 11   No current facility-administered medications for this visit.     Family History    Family History  Problem Relation Age of Onset   Hyperlipidemia Mother    Hypertension Mother    Heart attack Mother    Cancer Father        lung   Hypertension Sister    Diabetes Brother    Heart disease Brother    Hyperlipidemia Brother    Hypertension Brother    Heart attack Brother    Other Brother        DVT   She indicated that her mother is deceased. She indicated that her father is deceased. She indicated that her sister is alive. She indicated that her brother is alive.  Social History    Social History   Socioeconomic History   Marital status: Single    Spouse name: Not on file   Number of children: Not on file   Years of education: Not on file   Highest education level: Not on file  Occupational History   Occupation: retired  Tobacco Use   Smoking status: Never   Smokeless tobacco: Never  Vaping Use   Vaping status: Never Used  Substance and Sexual Activity   Alcohol use: Yes    Comment: rare   Drug use: No   Sexual activity: Not Currently    Birth control/protection:  Post-menopausal  Other Topics Concern   Not on file  Social History Narrative   Not on file   Social Determinants of Health   Financial  Resource Strain: Not on file  Food Insecurity: Not on file  Transportation Needs: Not on file  Physical Activity: Not on file  Stress: Not on file  Social Connections: Not on file  Intimate Partner Violence: Not on file     Review of Systems    General:  No chills, fever, night sweats or weight changes.  Cardiovascular:  No chest pain, dyspnea on exertion, edema, orthopnea, palpitations, paroxysmal nocturnal dyspnea. Dermatological: No rash, lesions/masses Respiratory: No cough, dyspnea Urologic: No hematuria, dysuria Abdominal:   No nausea, vomiting, diarrhea, bright red blood per rectum, melena, or hematemesis Neurologic:  No visual changes, wkns, changes in mental status. All other systems reviewed and are otherwise negative except as noted above.       Physical Exam    VS:  BP 138/60 (BP Location: Left Arm, Patient Position: Sitting, Cuff Size: Normal)   Pulse (!) 59   Ht 5\' 6"  (1.676 m)   Wt 170 lb 6.4 oz (77.3 kg)   SpO2 95%   BMI 27.50 kg/m  , BMI Body mass index is 27.5 kg/m.     GEN: Well nourished, well developed, in no acute distress. HEENT: normal. Neck: Supple, no JVD, No HJR, carotid bruits, or masses. Cardiac: RRR, 2/6 systolic murmurs, no rubs, or gallops. No clubbing, cyanosis, 2+ pretibial LE edema.  Radials/DP/PT 2+ and equal bilaterally.  Respiratory:  Respirations regular and unlabored, clear to auscultation bilaterally. GI: Soft, non tender, nondistended, BS + x 4. MS: no deformity or atrophy. Skin: warm and dry, no rash. Neuro:  Strength and sensation are intact. Psych: Normal affect.      Lab Results  Component Value Date   WBC 4.2 04/16/2022   HGB 12.9 04/16/2022   HCT 35.7 (L) 04/16/2022   MCV 81.1 04/16/2022   PLT 170 04/16/2022   Lab Results  Component Value Date   CREATININE 1.66 (H)  04/16/2022   BUN 26 (H) 04/16/2022   NA 142 04/16/2022   K 4.6 04/16/2022   CL 109 04/16/2022   CO2 27 04/16/2022   Lab Results  Component Value Date   ALT 20 04/11/2022   AST 20 04/11/2022   ALKPHOS 60 04/11/2022   BILITOT 0.5 04/11/2022   Lab Results  Component Value Date   CHOL 144 01/16/2021   HDL 69 01/16/2021   LDLCALC 63 01/16/2021   TRIG 61 01/16/2021   CHOLHDL 2.1 01/16/2021     Review of Prior Studies    Echocardiogram 09/15/2021 1. Limited echo with limited images, no color doppler   2. Left ventricular ejection fraction, by estimation, is 55 to 60%. The  left ventricle has normal function. The left ventricle has no regional  wall motion abnormalities. There is moderate eccentric left ventricular  hypertrophy of the basal-septal  segment. There is moderate hypokinesis of the left ventricular, basal  inferior wall.   Assessment & Plan   1.  Acute on chronic diastolic CHF: Patient had volume overload as result of eating very salty foods at a barbecue during 4 July.  As result of this her weight went up approximately 8 pounds.  She increased her Lasix from 40 mg daily to 40 mg twice daily for the last 2 days and has had some diuresis.  She has been sleeping in a recliner because it is more comfortable and she can keep her feet elevated.  She will continue Entresto 97/103 mg twice a day.  I will have her continue to take extra doses  of Lasix but decrease it to Lasix 60 mg daily for 7 days, will repeat BMET.  At the end of 7 days she should go back to 40 mg daily.  She should report significantly low blood pressure or muscle aches and pains from cramping.  She is to avoid salty foods.  Will need to repeat her echocardiogram once she is euvolemic.  2.  Hypertension: Blood pressure is moderately elevated today.  She is to continue amlodipine 5 mg daily isosorbide 60 mg daily.  3.  Uncontrolled diabetes: Hemoglobin A1c was 11.  She was started on Ozempic by her primary care  provider and is to continue NovoLog flex plan insulin as well as fingerstick blood sugars.  4.  Paroxysmal atrial fibrillation: Currently in normal sinus rhythm/sinus bradycardia.  She is to continue amiodarone 200 mg daily.  Will check a TSH with her be met today.  5.  Carotid artery disease: Continue Plavix daily as directed.        Signed, Bettey Mare. Liborio Nixon, ANP, AACC   01/05/2023 4:40 PM      Office [phone] Fax [phone]  Notice: This dictation was prepared with Dragon dictation along with smaller phrase technology. Any transcriptional errors that result from this process are unintentional and may not be corrected upon review.

## 2023-01-05 ENCOUNTER — Ambulatory Visit: Payer: Medicare HMO | Attending: Adult Health | Admitting: Adult Health

## 2023-01-05 ENCOUNTER — Encounter: Payer: Self-pay | Admitting: Adult Health

## 2023-01-05 VITALS — BP 138/60 | HR 59 | Ht 66.0 in | Wt 170.4 lb

## 2023-01-05 DIAGNOSIS — I6521 Occlusion and stenosis of right carotid artery: Secondary | ICD-10-CM | POA: Diagnosis not present

## 2023-01-05 DIAGNOSIS — Z794 Long term (current) use of insulin: Secondary | ICD-10-CM | POA: Diagnosis not present

## 2023-01-05 DIAGNOSIS — E1165 Type 2 diabetes mellitus with hyperglycemia: Secondary | ICD-10-CM

## 2023-01-05 DIAGNOSIS — I10 Essential (primary) hypertension: Secondary | ICD-10-CM

## 2023-01-05 DIAGNOSIS — Z79899 Other long term (current) drug therapy: Secondary | ICD-10-CM

## 2023-01-05 DIAGNOSIS — I5032 Chronic diastolic (congestive) heart failure: Secondary | ICD-10-CM

## 2023-01-05 DIAGNOSIS — I480 Paroxysmal atrial fibrillation: Secondary | ICD-10-CM

## 2023-01-05 MED ORDER — FUROSEMIDE 40 MG PO TABS: 60.0000 mg | ORAL_TABLET | Freq: Every day | ORAL | 3 refills | Status: DC

## 2023-01-05 NOTE — Patient Instructions (Addendum)
Medication Instructions:  Increase lasix from 40 mg to 60 mg(take 1.5 tablets daily)  Your physician has recommended you make the following change in your medication:   *If you need a refill on your cardiac medications before your next appointment, please call your pharmacy*   Lab Work: BMET CBC TSH  If you have labs (blood work) drawn today and your tests are completely normal, you will receive your results only by: MyChart Message (if you have MyChart) OR A paper copy in the mail If you have any lab test that is abnormal or we need to change your treatment, we will call you to review the results.   Testing/Procedures: none   Follow-Up: At [facility], you and your health needs are our priority.  As part of our continuing mission to provide you with exceptional heart care, we have created designated Provider Care Teams.  These Care Teams include your primary Cardiologist (physician) and Advanced Practice Providers (APPs -  Physician Assistants and Nurse Practitioners) who all work together to provide you with the care you need, when you need it.  We recommend signing up for the patient portal called "MyChart".  Sign up information is provided on this After Visit Summary.  MyChart is used to connect with patients for Virtual Visits (Telemedicine).  Patients are able to view lab/test results, encounter notes, upcoming appointments, etc.  Non-urgent messages can be sent to your provider as well.   To learn more about what you can do with MyChart, go to [email or web address].    Your next appointment:   1 week(s)  Provider:   Joni Reining, DNP, ANP

## 2023-01-06 LAB — BASIC METABOLIC PANEL
BUN/Creatinine Ratio: 18 (ref 12–28)
BUN: 38 mg/dL — ABNORMAL HIGH (ref 8–27)
CO2: 25 mmol/L (ref 20–29)
Calcium: 10.2 mg/dL (ref 8.7–10.3)
Chloride: 104 mmol/L (ref 96–106)
Creatinine, Ser: 2.16 mg/dL — ABNORMAL HIGH (ref 0.57–1.00)
Glucose: 120 mg/dL — ABNORMAL HIGH (ref 70–99)
Potassium: 4.9 mmol/L (ref 3.5–5.2)
Sodium: 146 mmol/L — ABNORMAL HIGH (ref 134–144)
eGFR: 23 mL/min/{1.73_m2} — ABNORMAL LOW (ref >59)

## 2023-01-06 LAB — CBC
Hematocrit: 37.2 % (ref 34.0–46.6)
Hemoglobin: 12.4 g/dL (ref 11.1–15.9)
MCH: 28 pg (ref 26.6–33.0)
MCHC: 33.3 g/dL (ref 31.5–35.7)
MCV: 84 fL (ref 79–97)
Platelets: 183 10*3/uL (ref 150–450)
RBC: 4.43 x10E6/uL (ref 3.77–5.28)
RDW: 15.3 % (ref 11.7–15.4)
WBC: 3.8 10*3/uL (ref 3.4–10.8)

## 2023-01-06 LAB — TSH: TSH: 1.44 u[IU]/mL (ref 0.450–4.500)

## 2023-01-15 ENCOUNTER — Telehealth: Payer: Self-pay

## 2023-01-15 NOTE — Telephone Encounter (Addendum)
Called patient regarding results. Left message for patient to call office.----- Message from Joni Reining sent at 01/07/2023  1:34 PM EDT ----- Kidney function is worsened from 3 months ago.  This may be related to uncontrolled diabetes as well as increased doses of lasix.  She is now on lasix 60 mg daily.  I want to decrease this back to 40 mg daily. May need to decrease the Entresto dose if creatinine remains elevated. Thyroid function is normal. Repeat BMET in 2 weeks.KL

## 2023-01-15 NOTE — Telephone Encounter (Signed)
Pt returning call

## 2023-01-17 ENCOUNTER — Telehealth: Payer: Self-pay

## 2023-01-17 NOTE — Telephone Encounter (Addendum)
Called patient regarding results. Spoke with patients daughter Alfreda . Patients daughter had understanding of results.----- Message from Joni Reining sent at 01/07/2023  1:34 PM EDT ----- Kidney function is worsened from 3 months ago.  This may be related to uncontrolled diabetes as well as increased doses of lasix.  She is now on lasix 60 mg daily.  I want to decrease this back to 40 mg daily. May need to decrease the Entresto dose if creatinine remains elevated. Thyroid function is normal. Repeat BMET in 2 weeks.KL

## 2023-01-25 NOTE — Progress Notes (Signed)
Cardiology Clinic Note   Patient Name: Cassandra Adams Date of Encounter: 01/26/2023  Primary Care Provider:  Assunta Found, MD Primary Cardiologist:  Cassandra Nose, MD  Patient Profile    77 year old female with hx of CAD, status post PCI to the RCA in 2015, recent chest pain and PCI to the LAD with chronic total occluded RCA (07/2021), AF - CHADSVASC score of 5 on Eliquis, Hypertension , Dyslipidemia, IDDM with neuropathy, poorly controlled with hyperglycemic admissions, Carotid artery stenosis/occlusion, mild 2016.   Past Medical History    Past Medical History:  Diagnosis Date   (HFpEF) heart failure with preserved ejection fraction (HCC)    Limited Echo 7/22: Mild asymmetrical LVH due to scar and thinning of basal posterior wall and background conc LVH, apical and mid segments hyperdynamic, EF 60-65, basal inf-lat and basal inf AK, normal RVSF, RVSP 26.6, mild LAE, mild-moderate MR, mild AV sclerosis w/o AS // Echo 5/22: EF 55-60, RVSP 27, mild to mod LAE, poss small to mod eff; AV sclerosis no AS   Arthritis    "all over"   CAD (coronary artery disease)    a. NSTEMI 12/2013 - occluded dominant RCA with L-R collaterals, moderate prox segmental LAD disease, for medical therapy initially, EF 60% with subtle inferobasal hypokinesia. Consider PCI for refractory CP.   CKD (chronic kidney disease), stage III (HCC)    Hypertension    Migraines    "weekly" (01/06/2014)   Sickle cell trait (HCC)    TIA (transient ischemic attack) 1978   Type II diabetes mellitus (HCC)    Vertigo    Past Surgical History:  Procedure Laterality Date   APPENDECTOMY  March 2014   had appendix frozen   CARDIAC CATHETERIZATION  "years ago" & 01/05/2014   CARDIOVERSION N/A 03/17/2014   Procedure: CARDIOVERSION;  Surgeon: Cassandra Nose, MD;  Location: [facility] ENDOSCOPY;  Service: Cardiovascular;  Laterality: N/A;   CARDIOVERSION N/A 01/14/2021   Procedure: CARDIOVERSION;  Surgeon: Cassandra Nose, MD;   Location: [facility] ENDOSCOPY;  Service: Cardiovascular;  Laterality: N/A;   CARDIOVERSION N/A 12/13/2021   Procedure: CARDIOVERSION;  Surgeon: Cassandra Mixer, MD;  Location: [facility] ENDOSCOPY;  Service: Cardiovascular;  Laterality: N/A;   CATARACT EXTRACTION W/ INTRAOCULAR LENS  IMPLANT, BILATERAL Bilateral 04/2013-05/2013   CORONARY ATHERECTOMY N/A 08/05/2021   Procedure: CORONARY ATHERECTOMY;  Surgeon: Cassandra Bollman, MD;  Location: [facility] INVASIVE CV LAB;  Service: Cardiovascular;  Laterality: N/A;   CORONARY PRESSURE/FFR STUDY N/A 08/04/2021   Procedure: INTRAVASCULAR PRESSURE WIRE/FFR STUDY;  Surgeon: Cassandra Kendall, MD;  Location: [facility] INVASIVE CV LAB;  Service: Cardiovascular;  Laterality: N/A;   CORONARY STENT INTERVENTION N/A 08/05/2021   Procedure: CORONARY STENT INTERVENTION;  Surgeon: Cassandra Bollman, MD;  Location: [facility] INVASIVE CV LAB;  Service: Cardiovascular;  Laterality: N/A;   ENDARTERECTOMY Right 02/05/2017   Procedure: ENDARTERECTOMY CAROTID-RIGHT;  Surgeon: Cassandra Kerns, MD;  Location: [facility] OR;  Service: Vascular;  Laterality: Right;   ESOPHAGOGASTRODUODENOSCOPY  11/08/2007    Normal esophagus without evidence of Barrett, mass, erosion/ Normal stomach, duodenal bulb   ESOPHAGOGASTRODUODENOSCOPY (EGD) WITH PROPOFOL N/A 11/08/2020   Procedure: ESOPHAGOGASTRODUODENOSCOPY (EGD) WITH PROPOFOL;  Surgeon: Cassandra Danas, MD;  Location: [facility] ENDOSCOPY;  Service: Gastroenterology;  Laterality: N/A;   GASTRIC MOTILITY STUDY  11/13/2007   mildly delayed emptying subjectively, but normal  analysis-77% of tracer emptied at 2 hours   JOINT REPLACEMENT     LAPAROSCOPIC APPENDECTOMY N/A 09/15/2012   Procedure: APPENDECTOMY LAPAROSCOPIC;  Surgeon:  Cassandra Heading, MD;  Location: [facility] ORS;  Service: General;  Laterality: N/A;   LEFT HEART CATH AND CORONARY ANGIOGRAPHY N/A 08/04/2021   Procedure: LEFT HEART CATH AND CORONARY ANGIOGRAPHY;  Surgeon: Cassandra Kendall, MD;  Location: [facility] INVASIVE CV LAB;  Service:  Cardiovascular;  Laterality: N/A;   LEFT HEART CATHETERIZATION WITH CORONARY ANGIOGRAM N/A 01/05/2014   Procedure: LEFT HEART CATHETERIZATION WITH CORONARY ANGIOGRAM;  Surgeon: Cassandra Gess, MD;  Location: [facility] CATH LAB;  Service: Cardiovascular;  Laterality: N/A;   REPLACEMENT TOTAL KNEE Right 05-03-06   TEE WITHOUT CARDIOVERSION N/A 03/17/2014   Procedure: TRANSESOPHAGEAL ECHOCARDIOGRAM (TEE);  Surgeon: Cassandra Nose, MD;  Location: [facility] ENDOSCOPY;  Service: Cardiovascular;  Laterality: N/A;   TONSILLECTOMY  1972    Allergies  Allergies  Allergen Reactions   Penicillins Hives    Has patient had a PCN reaction causing immediate rash, facial/tongue/throat swelling, SOB or lightheadedness with hypotension: no Has patient had a PCN reaction causing severe rash involving mucus membranes or skin necrosis: No  Has patient had a PCN reaction that required hospitalization: no Has patient had a PCN reaction occurring within the last 10 years: no If all of the above answers are "NO", then may proceed with Cephalosporin use.     History of Present Illness    [name] comes today for ongoing assessment and management of chronic diastolic CHF, HTN, uncontrolled diabetes, PAF, carotid artery disease. On last office visit she was volume overloaded, and was to take lasix 60 mg daily for 7 days, and then return to 40 mg daily thereafter. BMET was completed, creatinine 2.16 which was up 1.66.   She comes today feeling much better she still has some mild lower extremity edema in the dependent position but she has gone down on her fluid weight over time.  She started out at 170 pounds at home and did go all the way down to 164.5 pounds when she started going back to the normal dose of Lasix 40 mg daily her weight increased to 5 pounds.  That it has equilibrated out and she is running around 166-167 pounds at home.  She is doing her best to avoid salt.  She does keep her legs down, and is not as  active.  Home Medications    Current Outpatient Medications  Medication Sig Dispense Refill   amiodarone (PACERONE) 200 MG tablet Take 1 tablet (200 mg total) by mouth daily. Start taking from 12/23/21 30 tablet 2   amLODipine (NORVASC) 5 MG tablet Take 1 tablet (5 mg total) by mouth daily. 90 tablet 2   atorvastatin (LIPITOR) 80 MG tablet Take 1 tablet (80 mg total) by mouth every evening. 90 tablet 3   BD INSULIN SYRINGE U/F 31G X 5/16" 0.5 ML MISC Use to take with Novolog TIDAC 100 each 5   carvedilol (COREG) 3.125 MG tablet Take 1 tablet (3.125 mg total) by mouth 2 (two) times daily with a meal. 180 tablet 2   clopidogrel (PLAVIX) 75 MG tablet Take 1 tablet (75 mg total) by mouth daily. 30 tablet 1   Continuous Blood Gluc Receiver (FREESTYLE LIBRE 2 READER) DEVI 1 each by Does not apply route daily in the afternoon. 1 each 0   Continuous Blood Gluc Sensor (FREESTYLE LIBRE 2 SENSOR) MISC 1 each by Does not apply route daily in the afternoon. 1 each 0   ELIQUIS 5 MG TABS tablet TAKE (1) TABLET BY MOUTH TWICE DAILY. 180 tablet 1   empagliflozin (  JARDIANCE) 10 MG TABS tablet Take 1 tablet (10 mg total) by mouth daily. 90 tablet 3   furosemide (LASIX) 40 MG tablet Take 1.5 tablets (60 mg total) by mouth daily. 45 tablet 3   insulin aspart (NOVOLOG FLEXPEN) 100 UNIT/ML FlexPen Max daily 30 units 30 mL 3   Insulin Glargine (BASAGLAR KWIKPEN) 100 UNIT/ML Inject 28 Units into the skin daily. 30 mL 3   Insulin Pen Needle 32G X 4 MM MISC 1 Device by Does not apply route in the morning, at noon, in the evening, and at bedtime. 400 each 3   isosorbide mononitrate (IMDUR) 60 MG 24 hr tablet Take 60 mg by mouth daily.     nitroGLYCERIN (NITROSTAT) 0.4 MG SL tablet PLACE 1 TABLET UNDER THE TONGUE EVERY 5 MINUTES AS NEEDED FOR CHEST PAIN (UP TO 3 DOSES) (Patient taking differently: Place 0.4 mg under the tongue every 5 (five) minutes x 3 doses as needed for chest pain.) 25 tablet 5   ONETOUCH ULTRA test  strip TESTING 3 TIMES DAILY     polyethylene glycol (MIRALAX / GLYCOLAX) 17 g packet Take 17 g by mouth daily. 14 each 0   sacubitril-valsartan (ENTRESTO) 97-103 MG Take 0.5 tablets by mouth 2 (two) times daily. Start 12/28/21-STOP lasix when started 60 tablet 2   Semaglutide,0.25 or 0.5MG /DOS, 2 MG/3ML SOPN Inject 0.5 mg into the skin once a week. 3 mL 11   colesevelam (WELCHOL) 625 MG tablet Take 1,875 mg by mouth 2 (two) times daily with a meal. Noon and night (Patient not taking: Reported on 01/26/2023)     metoCLOPramide (REGLAN) 5 MG tablet Take 1 tablet (5 mg total) by mouth every 8 (eight) hours as needed for up to 10 days for nausea. 30 tablet 0   No current facility-administered medications for this visit.     Family History    Family History  Problem Relation Age of Onset   Hyperlipidemia Mother    Hypertension Mother    Heart attack Mother    Cancer Father        lung   Hypertension Sister    Diabetes Brother    Heart disease Brother    Hyperlipidemia Brother    Hypertension Brother    Heart attack Brother    Other Brother        DVT   She indicated that her mother is deceased. She indicated that her father is deceased. She indicated that her sister is alive. She indicated that her brother is alive.  Social History    Social History   Socioeconomic History   Marital status: Single    Spouse name: Not on file   Number of children: Not on file   Years of education: Not on file   Highest education level: Not on file  Occupational History   Occupation: retired  Tobacco Use   Smoking status: Never   Smokeless tobacco: Never  Vaping Use   Vaping status: Never Used  Substance and Sexual Activity   Alcohol use: Yes    Comment: rare   Drug use: No   Sexual activity: Not Currently    Birth control/protection: Post-menopausal  Other Topics Concern   Not on file  Social History Narrative   Not on file   Social Determinants of Health   Financial Resource Strain:  Not on file  Food Insecurity: Not on file  Transportation Needs: Not on file  Physical Activity: Not on file  Stress: Not on file  Social Connections: Not on file  Intimate Partner Violence: Not on file     Review of Systems    General:  No chills, fever, night sweats or weight changes.  Cardiovascular:  No chest pain, dyspnea on exertion, edema, orthopnea, palpitations, paroxysmal nocturnal dyspnea. Dermatological: No rash, lesions/masses Respiratory: No cough, dyspnea Urologic: No hematuria, dysuria Abdominal:   No nausea, vomiting, diarrhea, bright red blood per rectum, melena, or hematemesis Neurologic:  No visual changes, wkns, changes in mental status. All other systems reviewed and are otherwise negative except as noted above.       Physical Exam    VS:  BP (!) 142/66   Pulse (!) 59   Ht 5\' 6"  (1.676 m)   Wt 170 lb 9.6 oz (77.4 kg)   SpO2 96%   BMI 27.54 kg/m  , BMI Body mass index is 27.54 kg/m.     GEN: Well nourished, well developed, in no acute distress. HEENT: normal. Neck: Supple, no JVD, carotid bruits, or masses. Cardiac: RRR, no murmurs, rubs, or gallops. No clubbing, cyanosis, 1+ to 2+ pretibial to ankle edema bilaterally,.  Radials/DP/PT 2+ and equal bilaterally.  Respiratory:  Respirations regular and unlabored, clear to auscultation bilaterally. GI: Soft, nontender, nondistended, BS + x 4. MS: no deformity or atrophy. Skin: warm and dry, no rash. Neuro:  Strength and sensation are intact. Psych: Normal affect.      Lab Results  Component Value Date   WBC 3.8 01/05/2023   HGB 12.4 01/05/2023   HCT 37.2 01/05/2023   MCV 84 01/05/2023   PLT 183 01/05/2023   Lab Results  Component Value Date   CREATININE 2.16 (H) 01/05/2023   BUN 38 (H) 01/05/2023   NA 146 (H) 01/05/2023   K 4.9 01/05/2023   CL 104 01/05/2023   CO2 25 01/05/2023   Lab Results  Component Value Date   ALT 20 04/11/2022   AST 20 04/11/2022   ALKPHOS 60 04/11/2022    BILITOT 0.5 04/11/2022   Lab Results  Component Value Date   CHOL 144 01/16/2021   HDL 69 01/16/2021   LDLCALC 63 01/16/2021   TRIG 61 01/16/2021   CHOLHDL 2.1 01/16/2021    Lab Results  Component Value Date   HGBA1C 8.5 (A) 12/04/2022     Review of Prior Studies    Echocardiogram 09/15/2021 1. Limited echo with limited images, no color doppler   2. Left ventricular ejection fraction, by estimation, is 55 to 60%. The  left ventricle has normal function. The left ventricle has no regional  wall motion abnormalities. There is moderate eccentric left ventricular  hypertrophy of the basal-septal  segment. There is moderate hypokinesis of the left ventricular, basal  inferior wall.   Assessment & Plan   1.  Chronic lower extremity edema: I think it is a combination of dependent position, sodium in her diet, and the use of amlodipine.  She did do much better after increasing her Lasix to 60 mg for 1 week, and is now back down to 40 mg daily.  Her dry weight is around 166 pounds at home.  I would recheck a BMET to evaluate kidney function as it did bump on Lasix.  She has been advised to wear support hose which she can be measured for or have her family measure and have her wear them daily to help with dependent edema.  2.  Chronic diastolic heart failure: Patient appears euvolemic today with the exception of some mild lower  extremity dependent edema.  I will not make any further changes on her diuretic.  Do not want to stress kidneys any further in this elderly patient.  3.  Atrial fibrillation: Contributing to volume overload at times.  Heart rate currently is well-controlled and regular, on amiodarone 200 mg daily.  She remains on Eliquis 5 mg twice daily.  No issues with bleeding.  4.  Insulin-dependent diabetes: This is followed by PCP.  She does have some diabetic neuropathy in the lower extremities causing some numbness and sometimes pain.           Signed, Bettey Mare. Liborio Nixon, ANP, AACC   01/26/2023 2:14 PM      Office [phone] Fax [phone]  Notice: This dictation was prepared with Dragon dictation along with smaller phrase technology. Any transcriptional errors that result from this process are unintentional and may not be corrected upon review.

## 2023-01-26 ENCOUNTER — Ambulatory Visit: Payer: Medicare HMO | Attending: Adult Health | Admitting: Adult Health

## 2023-01-26 ENCOUNTER — Encounter: Payer: Self-pay | Admitting: Adult Health

## 2023-01-26 VITALS — BP 142/66 | HR 59 | Ht 66.0 in | Wt 170.6 lb

## 2023-01-26 DIAGNOSIS — Z79899 Other long term (current) drug therapy: Secondary | ICD-10-CM | POA: Diagnosis not present

## 2023-01-26 DIAGNOSIS — I10 Essential (primary) hypertension: Secondary | ICD-10-CM

## 2023-01-26 DIAGNOSIS — R609 Edema, unspecified: Secondary | ICD-10-CM

## 2023-01-26 DIAGNOSIS — I5032 Chronic diastolic (congestive) heart failure: Secondary | ICD-10-CM

## 2023-01-26 DIAGNOSIS — I480 Paroxysmal atrial fibrillation: Secondary | ICD-10-CM | POA: Diagnosis not present

## 2023-01-26 NOTE — Patient Instructions (Signed)
Medication Instructions:  No Changes *If you need a refill on your cardiac medications before your next appointment, please call your pharmacy*   Lab Work: BMET Today If you have labs (blood work) drawn today and your tests are completely normal, you will receive your results only by: MyChart Message (if you have MyChart) OR A paper copy in the mail If you have any lab test that is abnormal or we need to change your treatment, we will call you to review the results.   Testing/Procedures: No Testing   Follow-Up: At [facility], you and your health needs are our priority.  As part of our continuing mission to provide you with exceptional heart care, we have created designated Provider Care Teams.  These Care Teams include your primary Cardiologist (physician) and Advanced Practice Providers (APPs -  Physician Assistants and Nurse Practitioners) who all work together to provide you with the care you need, when you need it.  We recommend signing up for the patient portal called "MyChart".  Sign up information is provided on this After Visit Summary.  MyChart is used to connect with patients for Virtual Visits (Telemedicine).  Patients are able to view lab/test results, encounter notes, upcoming appointments, etc.  Non-urgent messages can be sent to your provider as well.   To learn more about what you can do with MyChart, go to [email or web address].    Your next appointment:   3 month(s)  Provider:   Joni Reining, DNP, ANP or, Chrystie Nose, MD

## 2023-01-28 ENCOUNTER — Telehealth: Payer: Self-pay | Admitting: Adult Health

## 2023-01-28 NOTE — Telephone Encounter (Signed)
LVM with cell phone of her daughter and LVM on patient's home phone to call back. Labs showed increased creatinine and want her to stop taking the lasix and only use PRN. Made comment on labs also, will have her called again on Monday 01/28/2023 if she does not return my call.  Creatinine increased to 2.5 from 1.66.   KL

## 2023-01-28 NOTE — Telephone Encounter (Signed)
Patient and daughter returned my call. I instructed her to stop taking lasix 40 mg daily and to only take 20 mg (1/2 TABLET) as need for swelling  She is to increase fluids (4 bottles of water a day), she is usually drinking 3 bottles of water daily. She is informed that she is to repeat her labs in two weeks.  She is to avoid salt. She and her daughter verbalize understanding. She is to call if she is feeling badly or beginning to have worsening edema.   KL

## 2023-01-29 ENCOUNTER — Telehealth: Payer: Self-pay

## 2023-01-29 DIAGNOSIS — I5032 Chronic diastolic (congestive) heart failure: Secondary | ICD-10-CM

## 2023-01-29 MED ORDER — SACUBITRIL-VALSARTAN 49-51 MG PO TABS: 1.0000 | ORAL_TABLET | Freq: Two times a day (BID) | ORAL | 2 refills | Status: DC

## 2023-01-29 NOTE — Telephone Encounter (Addendum)
Called patient regarding results. Spoke with patients daughter Alfreda advised to decrease Entresto to 49/51 mg Twice Daily. And Repeat BMET to be done Friday 02/02/23 Patietn daughter also advised to alternate Lasix 40 mg and 20 mg .----- Message from Joni Reining sent at 01/28/2023  2:36 PM EDT ----- I have reviewed her labs. The kidney function has worsened significantly.  Please have her stop the lasix 40 mg daily. She is to use this now at 20 mg daily PRN ONLY for lower extremity edema. Decrease Entresto to 49/51 mg BID. Repeat the BMET in 2 weeks.   KL

## 2023-01-29 NOTE — Telephone Encounter (Addendum)
Called patient regarding results. Left message for patient to call office.----- Message from Joni Reining sent at 01/28/2023  2:36 PM EDT ----- I have reviewed her labs. The kidney function has worsened significantly.  Please have her stop the lasix 40 mg daily. She is to use this now at 20 mg daily PRN ONLY for lower extremity edema. Decrease Entresto to 49/51 mg BID. Repeat the BMET in 2 weeks.   KL

## 2023-02-07 DIAGNOSIS — I129 Hypertensive chronic kidney disease with stage 1 through stage 4 chronic kidney disease, or unspecified chronic kidney disease: Secondary | ICD-10-CM | POA: Diagnosis not present

## 2023-02-07 DIAGNOSIS — D631 Anemia in chronic kidney disease: Secondary | ICD-10-CM | POA: Diagnosis not present

## 2023-02-07 DIAGNOSIS — N1832 Chronic kidney disease, stage 3b: Secondary | ICD-10-CM | POA: Diagnosis not present

## 2023-02-07 DIAGNOSIS — I509 Heart failure, unspecified: Secondary | ICD-10-CM | POA: Diagnosis not present

## 2023-02-07 DIAGNOSIS — N184 Chronic kidney disease, stage 4 (severe): Secondary | ICD-10-CM | POA: Diagnosis not present

## 2023-02-07 DIAGNOSIS — N2581 Secondary hyperparathyroidism of renal origin: Secondary | ICD-10-CM | POA: Diagnosis not present

## 2023-02-08 LAB — LAB REPORT - SCANNED: EGFR: 28

## 2023-02-14 ENCOUNTER — Encounter: Payer: Self-pay | Admitting: Nephrology

## 2023-04-27 ENCOUNTER — Encounter: Payer: Self-pay | Admitting: Adult Health

## 2023-04-27 ENCOUNTER — Ambulatory Visit: Payer: Medicare HMO | Attending: Adult Health | Admitting: Adult Health

## 2023-04-27 VITALS — BP 108/50 | HR 53 | Ht 66.0 in | Wt 165.8 lb

## 2023-04-27 DIAGNOSIS — I480 Paroxysmal atrial fibrillation: Secondary | ICD-10-CM | POA: Diagnosis not present

## 2023-04-27 DIAGNOSIS — E7800 Pure hypercholesterolemia, unspecified: Secondary | ICD-10-CM | POA: Diagnosis not present

## 2023-04-27 DIAGNOSIS — R609 Edema, unspecified: Secondary | ICD-10-CM

## 2023-04-27 DIAGNOSIS — N1831 Chronic kidney disease, stage 3a: Secondary | ICD-10-CM | POA: Diagnosis not present

## 2023-04-27 DIAGNOSIS — E1122 Type 2 diabetes mellitus with diabetic chronic kidney disease: Secondary | ICD-10-CM

## 2023-04-27 DIAGNOSIS — I10 Essential (primary) hypertension: Secondary | ICD-10-CM

## 2023-04-27 DIAGNOSIS — I2510 Atherosclerotic heart disease of native coronary artery without angina pectoris: Secondary | ICD-10-CM | POA: Diagnosis not present

## 2023-04-27 DIAGNOSIS — I6529 Occlusion and stenosis of unspecified carotid artery: Secondary | ICD-10-CM

## 2023-04-27 DIAGNOSIS — M25552 Pain in left hip: Secondary | ICD-10-CM | POA: Diagnosis not present

## 2023-04-27 DIAGNOSIS — Z794 Long term (current) use of insulin: Secondary | ICD-10-CM

## 2023-04-27 DIAGNOSIS — M79605 Pain in left leg: Secondary | ICD-10-CM | POA: Diagnosis not present

## 2023-04-27 NOTE — Patient Instructions (Signed)
Medication Instructions:  No Changes *If you need a refill on your cardiac medications before your next appointment, please call your pharmacy*   Lab Work: No Labs If you have labs (blood work) drawn today and your tests are completely normal, you will receive your results only by: [place] (if you have MyChart) OR A paper copy in the mail If you have any lab test that is abnormal or we need to change your treatment, we will call you to review the results.   Testing/Procedures: No Testing   Follow-Up: At [facility], you and your health needs are our priority.  As part of our continuing mission to provide you with exceptional heart care, we have created designated Provider Care Teams.  These Care Teams include your primary Cardiologist (physician) and Advanced Practice Providers (APPs -  Physician Assistants and Nurse Practitioners) who all work together to provide you with the care you need, when you need it.  We recommend signing up for the patient portal called "MyChart".  Sign up information is provided on this After Visit Summary.  MyChart is used to connect with patients for Virtual Visits (Telemedicine).  Patients are able to view lab/test results, encounter notes, upcoming appointments, etc.  Non-urgent messages can be sent to your provider as well.   To learn more about what you can do with MyChart, go to [email or web address].    Your next appointment:   6 month(s)  Provider:   [name], MD

## 2023-04-27 NOTE — Progress Notes (Signed)
Cardiology Office Note:  .   Date:  04/27/2023  ID:  Cassandra Adams, DOB 11-09-1945, MRN [id number] PCP: Assunta Found, MD  [facility] Providers Cardiologist:  Chrystie Nose, MD Cardiology APP:  Marcelino Duster, PA  }   History of Present Illness: Cassandra Adams is a 77 y.o. female  hx of CAD, status post PCI to the RCA in 2015, recent chest pain and PCI to the LAD with chronic total occluded RCA (07/2021), AF - CHADSVASC score of 5 on Eliquis, Hypertension , Dyslipidemia, IDDM with neuropathy, poorly controlled with hyperglycemic admissions, Carotid artery stenosis/occlusion, mild 2016. When last seen she was doing well with weight around 164-165 lbs. Rechecked BMET and found creatinine remained elevated at 2.59.She was instructed to decrease lasix to every other day.   She comes today with complaints of left hip and knee pain.  Lower extremity edema has been well-controlled on lower dose of Lasix.  Her diabetes has not been well-controlled as her hemoglobin A1c has been over 8 as reported by her daughter.  She is very sedentary due to the chronic pain.  She admits to drinking sugary sodas which she is aware keeps her blood sugar elevated.  She denies chest pain, dyspnea on exertion, palpitations, dizziness.  ROS: As above otherwise negative.  Studies Reviewed: .       Echocardiogram 09/15/2021 1. Limited echo with limited images, no color doppler   2. Left ventricular ejection fraction, by estimation, is 55 to 60%. The  left ventricle has normal function. The left ventricle has no regional  wall motion abnormalities. There is moderate eccentric left ventricular  hypertrophy of the basal-septal  segment. There is moderate hypokinesis of the left ventricular, basal  inferior wall.    Physical Exam:   VS:  There were no vitals taken for this visit.   Wt Readings from Last 3 Encounters:  01/26/23 170 lb 9.6 oz (77.4 kg)  01/05/23 170 lb 6.4 oz (77.3 kg)   12/04/22 172 lb (78 kg)    GEN: Well nourished, well developed in no acute distress NECK: No JVD; No carotid bruits CARDIAC: iRRR, 2/6 systolic murmur heard best at the right sternal border murmurs, rubs, gallops RESPIRATORY:  Clear to auscultation without rales, wheezing or rhonchi  ABDOMEN: Soft, non-tender, non-distended EXTREMITIES:  No edema; No deformity   ASSESSMENT AND PLAN: .    Chronic atrial fibrillation: Heart rate is well-controlled on current medication regimen despite her being in pain in her hip and knee on the left.  No evidence of bleeding, or complaints thereof.  She remains on Eliquis 5 mg twice daily and carvedilol 3.125 mg twice daily.  She will follow-up in 6 months unless she becomes symptomatic.  2.  Hypertension: Blood pressure is well-controlled.  It is reported is low normal in the setting of atrial fibrillation.  Will not make any changes in her medication regimen [place].  She will continue carvedilol and amlodipine.  3.  CAD with PCI to the right coronary artery in 2015 and PCI to the LAD with CTO of the right coronary artery in 2023.  She will continue on Plavix 75 mg daily.  May need to consider taking her off of it on 76-month follow-up.  4.  Hypercholesterolemia: Continue atorvastatin 80 mg daily.  Goal of LDL less than 70.  5.  Chronic hide CHF: Continues on Entresto.  Most echocardiogram reveals preserved EF.  No changes in her regimen continue low-sodium  diet.     6.  Uncontrolled diabetes: Mostly related to dietary indiscretion.  Followed by PCP.  I have advised her to stop drinking sugary sodas.  7.  PAD as complication of diabetes.  Carotid artery disease is noted.  May have evidence of lower extremity arterial disease as well.  She has chronic complaints of right hip pain and right knee pain.  Pulses are diminished.  Will need to consider repeat ABIs with Doppler study on follow-up appointment.  Did not pursue this today due to multiple complaints which  were to be addressed.  8: Left hip and left knee pain which is chronic and worsening.  Patient walks with a walker.  She is referred back to [facility] for ongoing evaluation and management.  Signed, Bettey Mare. Liborio Nixon, ANP, AACC

## 2023-05-07 DIAGNOSIS — E113413 Type 2 diabetes mellitus with severe nonproliferative diabetic retinopathy with macular edema, bilateral: Secondary | ICD-10-CM | POA: Diagnosis not present

## 2023-05-07 DIAGNOSIS — H26493 Other secondary cataract, bilateral: Secondary | ICD-10-CM | POA: Diagnosis not present

## 2023-05-07 DIAGNOSIS — Z794 Long term (current) use of insulin: Secondary | ICD-10-CM | POA: Diagnosis not present

## 2023-05-10 DIAGNOSIS — I509 Heart failure, unspecified: Secondary | ICD-10-CM | POA: Diagnosis not present

## 2023-05-10 DIAGNOSIS — E1129 Type 2 diabetes mellitus with other diabetic kidney complication: Secondary | ICD-10-CM | POA: Diagnosis not present

## 2023-05-10 DIAGNOSIS — E1122 Type 2 diabetes mellitus with diabetic chronic kidney disease: Secondary | ICD-10-CM | POA: Diagnosis not present

## 2023-05-10 DIAGNOSIS — N184 Chronic kidney disease, stage 4 (severe): Secondary | ICD-10-CM | POA: Diagnosis not present

## 2023-05-10 DIAGNOSIS — N2581 Secondary hyperparathyroidism of renal origin: Secondary | ICD-10-CM | POA: Diagnosis not present

## 2023-05-10 DIAGNOSIS — D631 Anemia in chronic kidney disease: Secondary | ICD-10-CM | POA: Diagnosis not present

## 2023-05-10 DIAGNOSIS — I129 Hypertensive chronic kidney disease with stage 1 through stage 4 chronic kidney disease, or unspecified chronic kidney disease: Secondary | ICD-10-CM | POA: Diagnosis not present

## 2023-05-10 DIAGNOSIS — N189 Chronic kidney disease, unspecified: Secondary | ICD-10-CM | POA: Diagnosis not present

## 2023-05-15 DIAGNOSIS — H26491 Other secondary cataract, right eye: Secondary | ICD-10-CM | POA: Diagnosis not present

## 2023-05-17 ENCOUNTER — Other Ambulatory Visit: Payer: Self-pay | Admitting: Adult Health

## 2023-05-21 DIAGNOSIS — H26492 Other secondary cataract, left eye: Secondary | ICD-10-CM | POA: Diagnosis not present

## 2023-06-06 DIAGNOSIS — E1051 Type 1 diabetes mellitus with diabetic peripheral angiopathy without gangrene: Secondary | ICD-10-CM | POA: Diagnosis not present

## 2023-06-06 DIAGNOSIS — L84 Corns and callosities: Secondary | ICD-10-CM | POA: Diagnosis not present

## 2023-06-06 DIAGNOSIS — E1042 Type 1 diabetes mellitus with diabetic polyneuropathy: Secondary | ICD-10-CM | POA: Diagnosis not present

## 2023-06-06 DIAGNOSIS — L603 Nail dystrophy: Secondary | ICD-10-CM | POA: Diagnosis not present

## 2023-06-06 DIAGNOSIS — I739 Peripheral vascular disease, unspecified: Secondary | ICD-10-CM | POA: Diagnosis not present

## 2023-06-11 ENCOUNTER — Ambulatory Visit: Payer: Medicare HMO | Admitting: Internal Medicine

## 2023-06-26 DIAGNOSIS — I2510 Atherosclerotic heart disease of native coronary artery without angina pectoris: Secondary | ICD-10-CM | POA: Diagnosis not present

## 2023-06-26 DIAGNOSIS — I130 Hypertensive heart and chronic kidney disease with heart failure and stage 1 through stage 4 chronic kidney disease, or unspecified chronic kidney disease: Secondary | ICD-10-CM | POA: Diagnosis not present

## 2023-06-27 DIAGNOSIS — G43909 Migraine, unspecified, not intractable, without status migrainosus: Secondary | ICD-10-CM | POA: Insufficient documentation

## 2023-06-27 DIAGNOSIS — D573 Sickle-cell trait: Secondary | ICD-10-CM | POA: Insufficient documentation

## 2023-06-27 DIAGNOSIS — M159 Polyosteoarthritis, unspecified: Secondary | ICD-10-CM | POA: Insufficient documentation

## 2023-06-27 DIAGNOSIS — Z8673 Personal history of transient ischemic attack (TIA), and cerebral infarction without residual deficits: Secondary | ICD-10-CM | POA: Insufficient documentation

## 2023-06-27 DIAGNOSIS — E1122 Type 2 diabetes mellitus with diabetic chronic kidney disease: Secondary | ICD-10-CM | POA: Insufficient documentation

## 2023-06-27 DIAGNOSIS — I130 Hypertensive heart and chronic kidney disease with heart failure and stage 1 through stage 4 chronic kidney disease, or unspecified chronic kidney disease: Secondary | ICD-10-CM | POA: Insufficient documentation

## 2023-07-16 ENCOUNTER — Ambulatory Visit (INDEPENDENT_AMBULATORY_CARE_PROVIDER_SITE_OTHER): Payer: Medicare HMO | Admitting: Internal Medicine

## 2023-07-16 VITALS — BP 148/68 | HR 54 | Resp 14 | Wt 161.6 lb

## 2023-07-16 DIAGNOSIS — E1159 Type 2 diabetes mellitus with other circulatory complications: Secondary | ICD-10-CM | POA: Diagnosis not present

## 2023-07-16 DIAGNOSIS — E1142 Type 2 diabetes mellitus with diabetic polyneuropathy: Secondary | ICD-10-CM

## 2023-07-16 DIAGNOSIS — Z794 Long term (current) use of insulin: Secondary | ICD-10-CM

## 2023-07-16 DIAGNOSIS — N1832 Chronic kidney disease, stage 3b: Secondary | ICD-10-CM | POA: Diagnosis not present

## 2023-07-16 DIAGNOSIS — E1122 Type 2 diabetes mellitus with diabetic chronic kidney disease: Secondary | ICD-10-CM

## 2023-07-16 LAB — POCT GLYCOSYLATED HEMOGLOBIN (HGB A1C): Hemoglobin A1C: 9.1 % — AB (ref 4.0–5.6)

## 2023-07-16 MED ORDER — DEXCOM G7 SENSOR MISC: 1.0000 | 3 refills | Status: DC

## 2023-07-16 NOTE — Progress Notes (Signed)
Name: Cassandra Adams  MRN/ DOB: [id number], 17-Jul-1945   Age/ Sex: 78 y.o., female    PCP: Assunta Found, MD   Reason for Endocrinology Evaluation: Type 2 Diabetes Mellitus     Date of Initial Endocrinology Visit: 12/04/2022    PATIENT IDENTIFIER: Cassandra Adams is a 78 y.o. female with a past medical history of DM, HTN, A-fib, CHF, CAD. The patient presented for initial endocrinology clinic visit on 12/04/2022 for consultative assistance with her diabetes management.    HPI: Cassandra Adams is accompanied by her son   Diagnosed with DM years ago  Prior Medications tried/Intolerance: Limited glycemic agents due to low GFR Hemoglobin A1c has ranged from 8.1% in 2022, peaking at 11.0%   On her initial visit to our clinic she had an A1c of 8.5%, she was on Jardiance, Basaglar and NovoLog per correction scale..   We started Ozempic, provided a standing dose of NovoLog with each meal plus correction scale  Ozempic was not started by the patient was taken off her list as she was having GI issue 06/2023 SUBJECTIVE:   During the last visit (12/04/2022): A1c 8.5%  Today (07/16/23): Cassandra Adams is here for follow-up with diabetes management.  She checks her blood sugars occasionally , she has not been using CGM technology.  She is accompanied by her daughter.   The patient has been having GI issues over the past week and has not been compliant with her medications due to weakness and fatigue,, she continues with nausea and occasional diarrhea but no fever  HOME DIABETES REGIMEN: Jardiance 10 mg daily Ozempic 0.5 mg weekly- not taking  Basaglar 28 units daily in the morning  NovoLog 4 units 3 times daily before every meal CF: NovoLog (BG -130/30)    100-150 = 6 units  151- 200 = 7 units   Statin: Yes ACE-I/ARB: Yes    METER DOWNLOAD SUMMARY: n/a   DIABETIC COMPLICATIONS: Microvascular complications:  CKD III, neuropathy Denies:  Last eye exam:  Completed 11/09/2022  Macrovascular complications:  CAD, CHF Denies: PVD, CVA   PAST HISTORY: Past Medical History:  Past Medical History:  Diagnosis Date   (HFpEF) heart failure with preserved ejection fraction (HCC)    Limited Echo 7/22: Mild asymmetrical LVH due to scar and thinning of basal posterior wall and background conc LVH, apical and mid segments hyperdynamic, EF 60-65, basal inf-lat and basal inf AK, normal RVSF, RVSP 26.6, mild LAE, mild-moderate MR, mild AV sclerosis w/o AS // Echo 5/22: EF 55-60, RVSP 27, mild to mod LAE, poss small to mod eff; AV sclerosis no AS   Arthritis    "all over"   CAD (coronary artery disease)    a. NSTEMI 12/2013 - occluded dominant RCA with L-R collaterals, moderate prox segmental LAD disease, for medical therapy initially, EF 60% with subtle inferobasal hypokinesia. Consider PCI for refractory CP.   CKD (chronic kidney disease), stage III (HCC)    Hypertension    Migraines    "weekly" (01/06/2014)   Sickle cell trait (HCC)    TIA (transient ischemic attack) 1978   Type II diabetes mellitus (HCC)    Vertigo    Past Surgical History:  Past Surgical History:  Procedure Laterality Date   APPENDECTOMY  March 2014   had appendix frozen   CARDIAC CATHETERIZATION  "years ago" & 01/05/2014   CARDIOVERSION N/A 03/17/2014   Procedure: CARDIOVERSION;  Surgeon: Chrystie Nose, MD;  Location: [facility] ENDOSCOPY;  Service: Cardiovascular;  Laterality: N/A;   CARDIOVERSION N/A 01/14/2021   Procedure: CARDIOVERSION;  Surgeon: Chrystie Nose, MD;  Location: [facility] ENDOSCOPY;  Service: Cardiovascular;  Laterality: N/A;   CARDIOVERSION N/A 12/13/2021   Procedure: CARDIOVERSION;  Surgeon: Vesta Mixer, MD;  Location: [facility] ENDOSCOPY;  Service: Cardiovascular;  Laterality: N/A;   CATARACT EXTRACTION W/ INTRAOCULAR LENS  IMPLANT, BILATERAL Bilateral 04/2013-05/2013   CORONARY ATHERECTOMY N/A 08/05/2021   Procedure: CORONARY ATHERECTOMY;  Surgeon: Tonny Bollman, MD;   Location: [facility] INVASIVE CV LAB;  Service: Cardiovascular;  Laterality: N/A;   CORONARY PRESSURE/FFR STUDY N/A 08/04/2021   Procedure: INTRAVASCULAR PRESSURE WIRE/FFR STUDY;  Surgeon: Yvonne Kendall, MD;  Location: [facility] INVASIVE CV LAB;  Service: Cardiovascular;  Laterality: N/A;   CORONARY STENT INTERVENTION N/A 08/05/2021   Procedure: CORONARY STENT INTERVENTION;  Surgeon: Tonny Bollman, MD;  Location: [facility] INVASIVE CV LAB;  Service: Cardiovascular;  Laterality: N/A;   ENDARTERECTOMY Right 02/05/2017   Procedure: ENDARTERECTOMY CAROTID-RIGHT;  Surgeon: Sherren Kerns, MD;  Location: [facility] OR;  Service: Vascular;  Laterality: Right;   ESOPHAGOGASTRODUODENOSCOPY  11/08/2007    Normal esophagus without evidence of Barrett, mass, erosion/ Normal stomach, duodenal bulb   ESOPHAGOGASTRODUODENOSCOPY (EGD) WITH PROPOFOL N/A 11/08/2020   Procedure: ESOPHAGOGASTRODUODENOSCOPY (EGD) WITH PROPOFOL;  Surgeon: Tressia Danas, MD;  Location: [facility] ENDOSCOPY;  Service: Gastroenterology;  Laterality: N/A;   GASTRIC MOTILITY STUDY  11/13/2007   mildly delayed emptying subjectively, but normal  analysis-77% of tracer emptied at 2 hours   JOINT REPLACEMENT     LAPAROSCOPIC APPENDECTOMY N/A 09/15/2012   Procedure: APPENDECTOMY LAPAROSCOPIC;  Surgeon: Dalia Heading, MD;  Location: [facility] ORS;  Service: General;  Laterality: N/A;   LEFT HEART CATH AND CORONARY ANGIOGRAPHY N/A 08/04/2021   Procedure: LEFT HEART CATH AND CORONARY ANGIOGRAPHY;  Surgeon: Yvonne Kendall, MD;  Location: [facility] INVASIVE CV LAB;  Service: Cardiovascular;  Laterality: N/A;   LEFT HEART CATHETERIZATION WITH CORONARY ANGIOGRAM N/A 01/05/2014   Procedure: LEFT HEART CATHETERIZATION WITH CORONARY ANGIOGRAM;  Surgeon: Runell Gess, MD;  Location: [facility] CATH LAB;  Service: Cardiovascular;  Laterality: N/A;   REPLACEMENT TOTAL KNEE Right 05-03-06   TEE WITHOUT CARDIOVERSION N/A 03/17/2014   Procedure: TRANSESOPHAGEAL ECHOCARDIOGRAM (TEE);  Surgeon: Chrystie Nose, MD;  Location: [facility] ENDOSCOPY;  Service: Cardiovascular;  Laterality: N/A;   TONSILLECTOMY  1972    Social History:  reports that she has never smoked. She has never used smokeless tobacco. She reports current alcohol use. She reports that she does not use drugs. Family History:  Family History  Problem Relation Age of Onset   Hyperlipidemia Mother    Hypertension Mother    Heart attack Mother    Cancer Father        lung   Hypertension Sister    Diabetes Brother    Heart disease Brother    Hyperlipidemia Brother    Hypertension Brother    Heart attack Brother    Other Brother        DVT     HOME MEDICATIONS: Allergies as of 07/16/2023       Reactions   Penicillins Hives   Has patient had a PCN reaction causing immediate rash, facial/tongue/throat swelling, SOB or lightheadedness with hypotension: no Has patient had a PCN reaction causing severe rash involving mucus membranes or skin necrosis: No  Has patient had a PCN reaction that required hospitalization: no Has patient had a PCN reaction occurring within the last 10 years: no If all of the above answers  are "NO", then may proceed with Cephalosporin use.        Medication List        Accurate as of July 16, 2023 10:30 AM. If you have any questions, ask your nurse or doctor.          amiodarone 200 MG tablet Commonly known as: Pacerone Take 1 tablet (200 mg total) by mouth daily. Start taking from 12/23/21   amLODipine 5 MG tablet Commonly known as: NORVASC Take 1 tablet (5 mg total) by mouth daily.   atorvastatin 80 MG tablet Commonly known as: LIPITOR Take 1 tablet (80 mg total) by mouth every evening.   Basaglar KwikPen 100 UNIT/ML Inject 28 Units into the skin daily.   BD Insulin Syringe U/F 31G X 5/16" 0.5 ML Misc Generic drug: Insulin Syringe-Needle U-100 Use to take with Novolog TIDAC   carvedilol 3.125 MG tablet Commonly known as: COREG Take 1 tablet (3.125 mg total) by mouth 2 (two)  times daily with a meal.   clopidogrel 75 MG tablet Commonly known as: PLAVIX Take 1 tablet (75 mg total) by mouth daily.   colesevelam 625 MG tablet Commonly known as: WELCHOL Take 1,875 mg by mouth 2 (two) times daily with a meal. Noon and night   Eliquis 5 MG Tabs tablet Generic drug: apixaban TAKE (1) TABLET BY MOUTH TWICE DAILY.   empagliflozin 10 MG Tabs tablet Commonly known as: Jardiance Take 1 tablet (10 mg total) by mouth daily.   Entresto 49-51 MG Generic drug: sacubitril-valsartan TAKE (1) TABLET BY MOUTH TWICE DAILY.   FreeStyle Libre 2 Reader [place] 1 each by Does not apply route daily in the afternoon.   FreeStyle Libre 2 Sensor Misc 1 each by Does not apply route daily in the afternoon.   furosemide 40 MG tablet Commonly known as: LASIX Take 1.5 tablets (60 mg total) by mouth daily.   Insulin Pen Needle 32G X 4 MM Misc 1 Device by Does not apply route in the morning, at noon, in the evening, and at bedtime.   isosorbide mononitrate 60 MG 24 hr tablet Commonly known as: IMDUR Take 60 mg by mouth daily.   metoCLOPramide 5 MG tablet Commonly known as: Reglan Take 1 tablet (5 mg total) by mouth every 8 (eight) hours as needed for up to 10 days for nausea.   nitroGLYCERIN 0.4 MG SL tablet Commonly known as: NITROSTAT PLACE 1 TABLET UNDER THE TONGUE EVERY 5 MINUTES AS NEEDED FOR CHEST PAIN (UP TO 3 DOSES) What changed: See the new instructions.   NovoLOG FlexPen 100 UNIT/ML FlexPen Generic drug: insulin aspart Max daily 30 units   OneTouch Ultra test strip Generic drug: glucose blood TESTING 3 TIMES DAILY   polyethylene glycol 17 g packet Commonly known as: MIRALAX / GLYCOLAX Take 17 g by mouth daily.   Semaglutide(0.25 or 0.5MG /DOS) 2 MG/3ML Sopn Inject 0.5 mg into the skin once a week.         ALLERGIES: Allergies  Allergen Reactions   Penicillins Hives    Has patient had a PCN reaction causing immediate rash, facial/tongue/throat  swelling, SOB or lightheadedness with hypotension: no Has patient had a PCN reaction causing severe rash involving mucus membranes or skin necrosis: No  Has patient had a PCN reaction that required hospitalization: no Has patient had a PCN reaction occurring within the last 10 years: no If all of the above answers are "NO", then may proceed with Cephalosporin use.      REVIEW  OF SYSTEMS: A comprehensive ROS was conducted with the patient and is negative except as per HPI    OBJECTIVE:   VITAL SIGNS: BP (!) 148/68   Pulse (!) 54   Resp 14   Wt 161 lb 9.6 oz (73.3 kg)   SpO2 99%   BMI 26.08 kg/m    PHYSICAL EXAM:  General: Pt appears well and is in NAD  Lungs: Clear with good BS bilat   Heart: RRR   Extremities:  Lower extremities - No pretibial edema.   Neuro: MS is good with appropriate affect, pt is alert and Ox3    DM foot exam: 12/04/2022  The skin of the feet is without sores or ulcerations The pedal pulses are 2+ on right and 2+ on left. The sensation is absent to a screening 5.07, 10 gram monofilament bilaterally    DATA REVIEWED:  Lab Results  Component Value Date   HGBA1C 8.5 (A) 12/04/2022   HGBA1C 11.5 (H) 04/11/2022   HGBA1C 8.1 (H) 06/11/2021    09/07/2022 BUN 26 CR 1.68 GFR 31 Ca 9.9 TG 101 HDL 83 LDL 86 TSH 0.926 Vitamin D 33.7   Old records , labs and images have been reviewed.   ASSESSMENT / PLAN / RECOMMENDATIONS:   1) Type 2 Diabetes Mellitus, poorly controlled, With CKD III , neuropathic and macrovascular complications - Most recent A1c of 9.1%. Goal A1c < 7.0 %.     -Patient with CKD, and neuropathic complications -Her A1c has trended up, I suspect medication non adherence.  She has not been checking her glucose on a regular basis, hence she has not been using correction scale, this would not be provided -Daughter would like a prescription for Dexcom, a prescription was sent to local pharmacy at this time -She has not started  Ozempic, will hold off as she is already having GI issues -I will increase NovoLog as below  MEDICATIONS:  Continue Jardiance 10 mg daily Continue Basaglar 28 units daily Take NovoLog 6 units 3 times daily before every meal   EDUCATION / INSTRUCTIONS: BG monitoring instructions: Patient is instructed to check her blood sugars 3 times a day. Call [facility] if: BG persistently < 70  I reviewed the Rule of 15 for the treatment of hypoglycemia in detail with the patient. Literature supplied.   2) Diabetic complications:  Eye: Does  have known diabetic retinopathy.  Neuro/ Feet: Does  have known diabetic peripheral neuropathy. Renal: Patient does  have known baseline CKD. She is under  the care under the care of nephrology     Follow-up in 4 months  I spent 25 minutes preparing to see the patient by review of recent labs, imaging and procedures, obtaining and reviewing separately obtained history, communicating with the patient/family or caregiver, ordering medications, tests or procedures, and documenting clinical information in the EHR including the differential Dx, treatment, and any further evaluation and other management   Signed electronically by: Lyndle Herrlich, MD  [facility] [place] [facility]  [facility] [address] [place] [place], [place] [address] Phone: [phone] FAX: [phone]   CC: Assunta Found, MD [address] [place] [address] Phone: [phone]  Fax: [phone]    Return to Endocrinology clinic as below: No future appointments.

## 2023-07-16 NOTE — Patient Instructions (Addendum)
Continue Jardiance 10 mg daily Continue  Basaglar 28 units once daily Novolog 6 units with each meal    - HOW TO TREAT LOW BLOOD SUGARS (Blood sugar LESS THAN 70 MG/DL) Please follow the RULE OF 15 for the treatment of hypoglycemia treatment (when your (blood sugars are less than 70 mg/dL)   STEP 1: Take 15 grams of carbohydrates when your blood sugar is low, which includes:  3-4 GLUCOSE TABS  OR 3-4 OZ OF JUICE OR REGULAR SODA OR ONE TUBE OF GLUCOSE GEL    STEP 2: RECHECK blood sugar in 15 MINUTES STEP 3: If your blood sugar is still low at the 15 minute recheck --> then, go back to STEP 1 and treat AGAIN with another 15 grams of carbohydrates.

## 2023-08-29 ENCOUNTER — Ambulatory Visit (HOSPITAL_COMMUNITY): Admission: RE | Admit: 2023-08-29 | Payer: Medicare HMO | Source: Ambulatory Visit

## 2023-09-07 ENCOUNTER — Telehealth: Payer: Self-pay | Admitting: Internal Medicine

## 2023-09-07 DIAGNOSIS — I6523 Occlusion and stenosis of bilateral carotid arteries: Secondary | ICD-10-CM

## 2023-09-07 NOTE — Telephone Encounter (Signed)
 Patient would like to r/s her carotid test for 10/04/23 at 3PM, but her order will expire on 09/21/23. Can we extend the order for [place] to schedule the patient on 10/04/23? Please advise.

## 2023-09-07 NOTE — Telephone Encounter (Signed)
 Called patient left message on personal voice mail new carotid orders placed.Scheduler will call back to schedule.

## 2023-09-13 DIAGNOSIS — M25561 Pain in right knee: Secondary | ICD-10-CM | POA: Diagnosis not present

## 2023-09-17 ENCOUNTER — Emergency Department (HOSPITAL_COMMUNITY)

## 2023-09-17 ENCOUNTER — Other Ambulatory Visit: Payer: Self-pay

## 2023-09-17 ENCOUNTER — Ambulatory Visit
Admission: EM | Admit: 2023-09-17 | Discharge: 2023-09-17 | Disposition: A | Discharge status: Home / Self Care | Attending: Nurse Practitioner | Admitting: Nurse Practitioner

## 2023-09-17 ENCOUNTER — Observation Stay (HOSPITAL_COMMUNITY)
Admission: EM | Admit: 2023-09-17 | Discharge: 2023-09-18 | Disposition: A | Discharge status: Home / Self Care | Attending: Internal Medicine | Admitting: Internal Medicine

## 2023-09-17 ENCOUNTER — Encounter (HOSPITAL_COMMUNITY): Payer: Self-pay

## 2023-09-17 DIAGNOSIS — Z7984 Long term (current) use of oral hypoglycemic drugs: Secondary | ICD-10-CM | POA: Insufficient documentation

## 2023-09-17 DIAGNOSIS — I130 Hypertensive heart and chronic kidney disease with heart failure and stage 1 through stage 4 chronic kidney disease, or unspecified chronic kidney disease: Secondary | ICD-10-CM | POA: Insufficient documentation

## 2023-09-17 DIAGNOSIS — R7989 Other specified abnormal findings of blood chemistry: Secondary | ICD-10-CM | POA: Insufficient documentation

## 2023-09-17 DIAGNOSIS — I672 Cerebral atherosclerosis: Secondary | ICD-10-CM | POA: Diagnosis not present

## 2023-09-17 DIAGNOSIS — R42 Dizziness and giddiness: Secondary | ICD-10-CM

## 2023-09-17 DIAGNOSIS — Z7901 Long term (current) use of anticoagulants: Secondary | ICD-10-CM | POA: Insufficient documentation

## 2023-09-17 DIAGNOSIS — I482 Chronic atrial fibrillation, unspecified: Secondary | ICD-10-CM | POA: Insufficient documentation

## 2023-09-17 DIAGNOSIS — I2510 Atherosclerotic heart disease of native coronary artery without angina pectoris: Secondary | ICD-10-CM | POA: Insufficient documentation

## 2023-09-17 DIAGNOSIS — N183 Chronic kidney disease, stage 3 unspecified: Secondary | ICD-10-CM | POA: Insufficient documentation

## 2023-09-17 DIAGNOSIS — R110 Nausea: Secondary | ICD-10-CM | POA: Diagnosis not present

## 2023-09-17 DIAGNOSIS — Z7902 Long term (current) use of antithrombotics/antiplatelets: Secondary | ICD-10-CM | POA: Diagnosis not present

## 2023-09-17 DIAGNOSIS — I5042 Chronic combined systolic (congestive) and diastolic (congestive) heart failure: Secondary | ICD-10-CM | POA: Diagnosis not present

## 2023-09-17 DIAGNOSIS — E1122 Type 2 diabetes mellitus with diabetic chronic kidney disease: Secondary | ICD-10-CM | POA: Diagnosis not present

## 2023-09-17 DIAGNOSIS — D72819 Decreased white blood cell count, unspecified: Secondary | ICD-10-CM

## 2023-09-17 DIAGNOSIS — Z8673 Personal history of transient ischemic attack (TIA), and cerebral infarction without residual deficits: Secondary | ICD-10-CM | POA: Insufficient documentation

## 2023-09-17 DIAGNOSIS — Z955 Presence of coronary angioplasty implant and graft: Secondary | ICD-10-CM | POA: Diagnosis not present

## 2023-09-17 DIAGNOSIS — I10 Essential (primary) hypertension: Secondary | ICD-10-CM

## 2023-09-17 DIAGNOSIS — R519 Headache, unspecified: Secondary | ICD-10-CM | POA: Diagnosis not present

## 2023-09-17 DIAGNOSIS — I4891 Unspecified atrial fibrillation: Secondary | ICD-10-CM | POA: Diagnosis present

## 2023-09-17 DIAGNOSIS — I160 Hypertensive urgency: Secondary | ICD-10-CM | POA: Diagnosis not present

## 2023-09-17 DIAGNOSIS — Z79899 Other long term (current) drug therapy: Secondary | ICD-10-CM | POA: Insufficient documentation

## 2023-09-17 DIAGNOSIS — I5022 Chronic systolic (congestive) heart failure: Secondary | ICD-10-CM

## 2023-09-17 DIAGNOSIS — Z96651 Presence of right artificial knee joint: Secondary | ICD-10-CM | POA: Insufficient documentation

## 2023-09-17 DIAGNOSIS — R079 Chest pain, unspecified: Secondary | ICD-10-CM | POA: Diagnosis not present

## 2023-09-17 DIAGNOSIS — Z794 Long term (current) use of insulin: Secondary | ICD-10-CM | POA: Diagnosis not present

## 2023-09-17 DIAGNOSIS — R918 Other nonspecific abnormal finding of lung field: Secondary | ICD-10-CM | POA: Diagnosis not present

## 2023-09-17 DIAGNOSIS — I161 Hypertensive emergency: Principal | ICD-10-CM | POA: Insufficient documentation

## 2023-09-17 LAB — BRAIN NATRIURETIC PEPTIDE: B Natriuretic Peptide: 657 pg/mL — ABNORMAL HIGH (ref 0.0–100.0)

## 2023-09-17 LAB — COMPREHENSIVE METABOLIC PANEL
ALT: 19 U/L (ref 0–44)
AST: 29 U/L (ref 15–41)
Albumin: 3.3 g/dL — ABNORMAL LOW (ref 3.5–5.0)
Alkaline Phosphatase: 64 U/L (ref 38–126)
Anion gap: 9 (ref 5–15)
BUN: 25 mg/dL — ABNORMAL HIGH (ref 8–23)
CO2: 29 mmol/L (ref 22–32)
Calcium: 9.3 mg/dL (ref 8.9–10.3)
Chloride: 102 mmol/L (ref 98–111)
Creatinine, Ser: 1.63 mg/dL — ABNORMAL HIGH (ref 0.44–1.00)
GFR, Estimated: 32 mL/min — ABNORMAL LOW (ref >60)
Glucose, Bld: 236 mg/dL — ABNORMAL HIGH (ref 70–99)
Potassium: 3.8 mmol/L (ref 3.5–5.1)
Sodium: 140 mmol/L (ref 135–145)
Total Bilirubin: 0.5 mg/dL (ref 0.0–1.2)
Total Protein: 6.6 g/dL (ref 6.5–8.1)

## 2023-09-17 LAB — CBC WITH DIFFERENTIAL/PLATELET
Abs Immature Granulocytes: 0.01 10*3/uL (ref 0.00–0.07)
Basophils Absolute: 0 10*3/uL (ref 0.0–0.1)
Basophils Relative: 1 %
Eosinophils Absolute: 0.1 10*3/uL (ref 0.0–0.5)
Eosinophils Relative: 4 %
HCT: 37.1 % (ref 36.0–46.0)
Hemoglobin: 12.5 g/dL (ref 12.0–15.0)
Immature Granulocytes: 0 %
Lymphocytes Relative: 34 %
Lymphs Abs: 1 10*3/uL (ref 0.7–4.0)
MCH: 28 pg (ref 26.0–34.0)
MCHC: 33.7 g/dL (ref 30.0–36.0)
MCV: 83.2 fL (ref 80.0–100.0)
Monocytes Absolute: 0.3 10*3/uL (ref 0.1–1.0)
Monocytes Relative: 11 %
Neutro Abs: 1.6 10*3/uL — ABNORMAL LOW (ref 1.7–7.7)
Neutrophils Relative %: 50 %
Platelets: 197 10*3/uL (ref 150–400)
RBC: 4.46 MIL/uL (ref 3.87–5.11)
RDW: 15.3 % (ref 11.5–15.5)
WBC: 3.1 10*3/uL — ABNORMAL LOW (ref 4.0–10.5)
nRBC: 0 % (ref 0.0–0.2)

## 2023-09-17 LAB — CBG MONITORING, ED: Glucose-Capillary: 46 mg/dL — ABNORMAL LOW (ref 70–99)

## 2023-09-17 LAB — RAPID URINE DRUG SCREEN, HOSP PERFORMED
Amphetamines: NOT DETECTED
Barbiturates: NOT DETECTED
Benzodiazepines: NOT DETECTED
Cocaine: NOT DETECTED
Opiates: NOT DETECTED
Tetrahydrocannabinol: NOT DETECTED

## 2023-09-17 LAB — TROPONIN I (HIGH SENSITIVITY)
Troponin I (High Sensitivity): 46 ng/L — ABNORMAL HIGH (ref <18)
Troponin I (High Sensitivity): 45 ng/L — ABNORMAL HIGH (ref <18)
Troponin I (High Sensitivity): 41 ng/L — ABNORMAL HIGH (ref <18)

## 2023-09-17 MED ORDER — SACUBITRIL-VALSARTAN 49-51 MG PO TABS
1.0000 | ORAL_TABLET | Freq: Two times a day (BID) | ORAL | Status: DC
  Administered 2023-09-17 – 2023-09-18 (×2): 1 via ORAL
  Filled 2023-09-17 (×2): qty 1

## 2023-09-17 MED ORDER — INSULIN ASPART 100 UNIT/ML IJ SOLN
0.0000 [IU] | Freq: Three times a day (TID) | INTRAMUSCULAR | Status: DC
  Administered 2023-09-18: 5 [IU] via SUBCUTANEOUS
  Administered 2023-09-18: 8 [IU] via SUBCUTANEOUS
  Filled 2023-09-17 (×2): qty 1

## 2023-09-17 MED ORDER — HYDRALAZINE HCL 20 MG/ML IJ SOLN: 10.0000 mg | Freq: Once | INTRAMUSCULAR | Status: DC

## 2023-09-17 MED ORDER — INSULIN ASPART 100 UNIT/ML IJ SOLN: 0.0000 [IU] | Freq: Every day | INTRAMUSCULAR | Status: DC

## 2023-09-17 MED ORDER — DEXTROSE 10 % IV SOLN: INTRAVENOUS | Status: DC

## 2023-09-17 MED ORDER — NITROGLYCERIN IN D5W 200-5 MCG/ML-% IV SOLN
5.0000 ug/min | INTRAVENOUS | Status: DC
  Administered 2023-09-17: 50 ug/min via INTRAVENOUS
  Filled 2023-09-17: qty 250

## 2023-09-17 MED ORDER — METOCLOPRAMIDE HCL 5 MG/ML IJ SOLN
10.0000 mg | Freq: Once | INTRAMUSCULAR | Status: AC
  Administered 2023-09-17: 10 mg via INTRAVENOUS
  Filled 2023-09-17: qty 2

## 2023-09-17 MED ORDER — FUROSEMIDE 10 MG/ML IJ SOLN
40.0000 mg | Freq: Once | INTRAMUSCULAR | Status: AC
  Administered 2023-09-17: 40 mg via INTRAVENOUS
  Filled 2023-09-17: qty 4

## 2023-09-17 MED ORDER — ACETAMINOPHEN 650 MG RE SUPP: 650.0000 mg | Freq: Four times a day (QID) | RECTAL | Status: DC | PRN

## 2023-09-17 MED ORDER — AMLODIPINE BESYLATE 5 MG PO TABS: 5.0000 mg | ORAL_TABLET | Freq: Every day | ORAL | Status: DC

## 2023-09-17 MED ORDER — DEXTROSE 50 % IV SOLN
INTRAVENOUS | Status: AC
  Filled 2023-09-17: qty 50

## 2023-09-17 MED ORDER — ISOSORBIDE MONONITRATE ER 60 MG PO TB24
60.0000 mg | ORAL_TABLET | Freq: Every day | ORAL | Status: DC
  Administered 2023-09-18: 60 mg via ORAL
  Filled 2023-09-17: qty 1

## 2023-09-17 MED ORDER — ENOXAPARIN SODIUM 30 MG/0.3ML IJ SOSY
30.0000 mg | PREFILLED_SYRINGE | INTRAMUSCULAR | Status: DC
  Administered 2023-09-18: 30 mg via SUBCUTANEOUS
  Filled 2023-09-17: qty 0.3

## 2023-09-17 MED ORDER — POLYETHYLENE GLYCOL 3350 17 G PO PACK: 17.0000 g | PACK | Freq: Every day | ORAL | Status: DC | PRN

## 2023-09-17 MED ORDER — CLOPIDOGREL BISULFATE 75 MG PO TABS
75.0000 mg | ORAL_TABLET | Freq: Every day | ORAL | Status: DC
  Administered 2023-09-18: 75 mg via ORAL
  Filled 2023-09-17: qty 1

## 2023-09-17 MED ORDER — ACETAMINOPHEN 325 MG PO TABS
650.0000 mg | ORAL_TABLET | Freq: Once | ORAL | Status: AC
  Administered 2023-09-17: 650 mg via ORAL
  Filled 2023-09-17: qty 2

## 2023-09-17 MED ORDER — AMLODIPINE BESYLATE 5 MG PO TABS
10.0000 mg | ORAL_TABLET | Freq: Every day | ORAL | Status: DC
  Administered 2023-09-17 – 2023-09-18 (×2): 10 mg via ORAL
  Filled 2023-09-17 (×2): qty 2

## 2023-09-17 MED ORDER — ACETAMINOPHEN 325 MG PO TABS
650.0000 mg | ORAL_TABLET | Freq: Four times a day (QID) | ORAL | Status: DC | PRN
  Administered 2023-09-18: 650 mg via ORAL
  Filled 2023-09-17: qty 2

## 2023-09-17 MED ORDER — NITROGLYCERIN IN D5W 200-5 MCG/ML-% IV SOLN: 5.0000 ug/min | INTRAVENOUS | Status: DC

## 2023-09-17 MED ORDER — DIPHENHYDRAMINE HCL 50 MG/ML IJ SOLN
12.5000 mg | Freq: Once | INTRAMUSCULAR | Status: AC
  Administered 2023-09-17: 12.5 mg via INTRAVENOUS
  Filled 2023-09-17: qty 1

## 2023-09-17 MED ORDER — ASPIRIN 325 MG PO TABS
325.0000 mg | ORAL_TABLET | Freq: Once | ORAL | Status: AC
  Administered 2023-09-17: 325 mg via ORAL
  Filled 2023-09-17: qty 1

## 2023-09-17 NOTE — ED Notes (Signed)
 Patient is being discharged from the Urgent Care and sent to the Emergency Department via [facility] . Per provider Anderson Malta, patient is in need of higher level of care due to High blood pressure, dizziness needing further testing. Patient is aware and verbalizes understanding of plan of care.  Vitals:   09/17/23 1327  BP: (!) 185/74  Pulse: (!) 51  Resp: 16  Temp: 98.6 F (37 C)  SpO2: 94%

## 2023-09-17 NOTE — Assessment & Plan Note (Signed)
 With acute episode right now. Treat with acetaminophen. CT head wnl.

## 2023-09-17 NOTE — ED Provider Notes (Signed)
 [facility]    CSN: [id number] Arrival date & time: 09/17/23  1303      History   Chief Complaint Chief Complaint  Patient presents with   Hypertension    HPI Cassandra Adams is a 78 y.o. female.   Patient presents today with friend who helps with transportation for 1 day of dizziness.  Reports when she woke up this morning, she felt "dizzy headed."  She reports she checked her blood pressure and noted it was 213/104.  Reports she went to the bathroom and sat down on the toilet and felt "like I was going to pass out."  Reports that she called her friend who took her to urgent care.  She denies any current chest pain or shortness of breath.  No diaphoresis, room spinning sensation, double vision, trouble with speech, or trouble with walking.  She has taken all her medications as prescribed recently.  No significant change in appetite, abdominal pain, nausea/vomiting, or diarrhea recently.  Medical history significant for heart failure, coronary artery disease with NSTEMI, chronic kidney disease stage III, diabetes type 2, and transient ischemic attack.    Past Medical History:  Diagnosis Date   (HFpEF) heart failure with preserved ejection fraction (HCC)    Limited Echo 7/22: Mild asymmetrical LVH due to scar and thinning of basal posterior wall and background conc LVH, apical and mid segments hyperdynamic, EF 60-65, basal inf-lat and basal inf AK, normal RVSF, RVSP 26.6, mild LAE, mild-moderate MR, mild AV sclerosis w/o AS // Echo 5/22: EF 55-60, RVSP 27, mild to mod LAE, poss small to mod eff; AV sclerosis no AS   Arthritis    "all over"   CAD (coronary artery disease)    a. NSTEMI 12/2013 - occluded dominant RCA with L-R collaterals, moderate prox segmental LAD disease, for medical therapy initially, EF 60% with subtle inferobasal hypokinesia. Consider PCI for refractory CP.   CKD (chronic kidney disease), stage III (HCC)    Hypertension    Migraines    "weekly"  (01/06/2014)   Sickle cell trait (HCC)    TIA (transient ischemic attack) 1978   Type II diabetes mellitus (HCC)    Vertigo     Patient Active Problem List   Diagnosis Date Noted   Pressure injury of skin 04/14/2022   Hyperglycemia due to type 2 diabetes mellitus (HCC) 04/11/2022   Nausea & vomiting 04/11/2022   Severe nonproliferative diabetic retinopathy of right eye without macular edema associated with type 2 diabetes mellitus (HCC) 02/06/2022   Severe nonproliferative diabetic retinopathy of left eye, with macular edema, associated with type 2 diabetes mellitus (HCC) 02/06/2022   A-fib (HCC) 12/10/2021   Demand ischemia (HCC)    Mixed hyperlipidemia 09/15/2021   Diabetic foot ulcer (HCC) 09/15/2021   Chronic diastolic CHF (congestive heart failure) (HCC) 09/14/2021   NSTEMI (non-ST elevated myocardial infarction) (HCC) 08/02/2021   Sepsis (HCC) 06/11/2021   Secondary hypercoagulable state (HCC) 01/25/2021   Acute pulmonary edema (HCC)    Acute CHF (congestive heart failure) (HCC) 01/16/2021   Acute respiratory distress 01/16/2021   Microcytic anemia 01/16/2021   Hypertensive urgency 01/16/2021   Acute on chronic diastolic (congestive) heart failure (HCC) 01/16/2021   Persistent atrial fibrillation (HCC)    Gastric nodule    Acute blood loss anemia 11/07/2020   Hematemesis with nausea 11/07/2020   Atrial fibrillation, chronic (HCC) 11/07/2020   Transient hypotension 11/07/2020   Atypical chest pain 11/07/2020   Elevated troponin 11/07/2020  Syncope and collapse 11/05/2020   CAD (coronary artery disease)    Epistaxis due to trauma    Benign paroxysmal positional vertigo 05/11/2019   Chronic headaches 05/11/2019   Atrial fibrillation with RVR (HCC)    Right hemiparesis (HCC) 05/05/2019   Uncontrolled type 2 diabetes mellitus with hyperglycemia (HCC) 05/05/2019   Stage 3b chronic kidney disease (CKD) (HCC) 05/05/2019   Left-sided weakness 05/04/2019   Aphasia 02/26/2019    DM type 2 causing vascular disease (HCC) 06/11/2017   Type 2 diabetes mellitus with stage 3a chronic kidney disease, with long-term current use of insulin (HCC) 06/11/2017   Carotid stenosis 02/05/2017   TIA (transient ischemic attack) 01/20/2017   Carotid stenosis, right 01/20/2017   Diabetic hyperosmolar non-ketotic state (HCC) 01/19/2017   AKI (acute kidney injury) (HCC) 01/19/2017   Left leg weakness 01/19/2017   Dyslipidemia 11/24/2016   Diaphoresis 02/18/2016   Hypoglycemia 02/18/2016   Bradycardia 04/22/2015   Coronary artery disease due to lipid rich plaque 02/26/2014   PAF (paroxysmal atrial fibrillation) (HCC) 02/26/2014   Occlusion and stenosis of carotid artery without mention of cerebral infarction 11/21/2013   Pain in limb-Left neck 11/21/2013   Carotid stenosis, bilateral 11/17/2011   Subclinical hyperthyroidism 02/15/2010   GERD 02/15/2010   Dysphagia 02/15/2010   Essential hypertension 02/11/2010    Past Surgical History:  Procedure Laterality Date   APPENDECTOMY  March 2014   had appendix frozen   CARDIAC CATHETERIZATION  "years ago" & 01/05/2014   CARDIOVERSION N/A 03/17/2014   Procedure: CARDIOVERSION;  Surgeon: Chrystie Nose, MD;  Location: [facility] ENDOSCOPY;  Service: Cardiovascular;  Laterality: N/A;   CARDIOVERSION N/A 01/14/2021   Procedure: CARDIOVERSION;  Surgeon: Chrystie Nose, MD;  Location: [facility] ENDOSCOPY;  Service: Cardiovascular;  Laterality: N/A;   CARDIOVERSION N/A 12/13/2021   Procedure: CARDIOVERSION;  Surgeon: Vesta Mixer, MD;  Location: [facility] ENDOSCOPY;  Service: Cardiovascular;  Laterality: N/A;   CATARACT EXTRACTION W/ INTRAOCULAR LENS  IMPLANT, BILATERAL Bilateral 04/2013-05/2013   CORONARY ATHERECTOMY N/A 08/05/2021   Procedure: CORONARY ATHERECTOMY;  Surgeon: Tonny Bollman, MD;  Location: [facility] INVASIVE CV LAB;  Service: Cardiovascular;  Laterality: N/A;   CORONARY PRESSURE/FFR STUDY N/A 08/04/2021   Procedure: INTRAVASCULAR PRESSURE  WIRE/FFR STUDY;  Surgeon: Yvonne Kendall, MD;  Location: [facility] INVASIVE CV LAB;  Service: Cardiovascular;  Laterality: N/A;   CORONARY STENT INTERVENTION N/A 08/05/2021   Procedure: CORONARY STENT INTERVENTION;  Surgeon: Tonny Bollman, MD;  Location: [facility] INVASIVE CV LAB;  Service: Cardiovascular;  Laterality: N/A;   ENDARTERECTOMY Right 02/05/2017   Procedure: ENDARTERECTOMY CAROTID-RIGHT;  Surgeon: Sherren Kerns, MD;  Location: [facility] OR;  Service: Vascular;  Laterality: Right;   ESOPHAGOGASTRODUODENOSCOPY  11/08/2007    Normal esophagus without evidence of Barrett, mass, erosion/ Normal stomach, duodenal bulb   ESOPHAGOGASTRODUODENOSCOPY (EGD) WITH PROPOFOL N/A 11/08/2020   Procedure: ESOPHAGOGASTRODUODENOSCOPY (EGD) WITH PROPOFOL;  Surgeon: Tressia Danas, MD;  Location: [facility] ENDOSCOPY;  Service: Gastroenterology;  Laterality: N/A;   GASTRIC MOTILITY STUDY  11/13/2007   mildly delayed emptying subjectively, but normal  analysis-77% of tracer emptied at 2 hours   JOINT REPLACEMENT     LAPAROSCOPIC APPENDECTOMY N/A 09/15/2012   Procedure: APPENDECTOMY LAPAROSCOPIC;  Surgeon: Dalia Heading, MD;  Location: [facility] ORS;  Service: General;  Laterality: N/A;   LEFT HEART CATH AND CORONARY ANGIOGRAPHY N/A 08/04/2021   Procedure: LEFT HEART CATH AND CORONARY ANGIOGRAPHY;  Surgeon: Yvonne Kendall, MD;  Location: [facility] INVASIVE CV LAB;  Service: Cardiovascular;  Laterality: N/A;  LEFT HEART CATHETERIZATION WITH CORONARY ANGIOGRAM N/A 01/05/2014   Procedure: LEFT HEART CATHETERIZATION WITH CORONARY ANGIOGRAM;  Surgeon: Runell Gess, MD;  Location: [facility] CATH LAB;  Service: Cardiovascular;  Laterality: N/A;   REPLACEMENT TOTAL KNEE Right 05-03-06   TEE WITHOUT CARDIOVERSION N/A 03/17/2014   Procedure: TRANSESOPHAGEAL ECHOCARDIOGRAM (TEE);  Surgeon: Chrystie Nose, MD;  Location: [facility] ENDOSCOPY;  Service: Cardiovascular;  Laterality: N/A;   TONSILLECTOMY  1972    OB History     Gravida  2   Para  2   Term   2   Preterm      AB      Living  2      SAB      IAB      Ectopic      Multiple      Live Births               Home Medications    Prior to Admission medications   Medication Sig Start Date End Date Taking? Authorizing Provider  amiodarone (PACERONE) 200 MG tablet Take 1 tablet (200 mg total) by mouth daily. Start taking from 12/23/21 12/23/21  Yes Lama, Sarina Ill, MD  amLODipine (NORVASC) 5 MG tablet Take 1 tablet (5 mg total) by mouth daily. 10/18/22  Yes Chrystie Nose, MD  atorvastatin (LIPITOR) 80 MG tablet Take 1 tablet (80 mg total) by mouth every evening. 12/21/20  Yes Hilty, Lisette Abu, MD  BD INSULIN SYRINGE U/F 31G X 5/16" 0.5 ML MISC Use to take with Novolog [facility] 01/07/21  Yes Nida, Denman George, MD  carvedilol (COREG) 3.125 MG tablet Take 1 tablet (3.125 mg total) by mouth 2 (two) times daily with a meal. 10/18/22  Yes Hilty, Lisette Abu, MD  clopidogrel (PLAVIX) 75 MG tablet Take 1 tablet (75 mg total) by mouth daily. 09/17/21  Yes Tat, Onalee Hua, MD  Continuous Glucose Sensor (DEXCOM G7 SENSOR) MISC 1 Device by Does not apply route as directed. 07/16/23  Yes Shamleffer, Konrad Dolores, MD  ELIQUIS 5 MG TABS tablet TAKE (1) TABLET BY MOUTH TWICE DAILY. 05/24/22  Yes Hilty, Lisette Abu, MD  empagliflozin (JARDIANCE) 10 MG TABS tablet Take 1 tablet (10 mg total) by mouth daily. 12/04/22  Yes Shamleffer, Konrad Dolores, MD  furosemide (LASIX) 40 MG tablet Take 1.5 tablets (60 mg total) by mouth daily. 01/05/23 12/31/23 Yes Jodelle Gross, NP  insulin aspart (NOVOLOG FLEXPEN) 100 UNIT/ML FlexPen Max daily 30 units 12/04/22  Yes Shamleffer, Konrad Dolores, MD  Insulin Glargine (BASAGLAR KWIKPEN) 100 UNIT/ML Inject 28 Units into the skin daily. 12/04/22  Yes Shamleffer, Konrad Dolores, MD  metoCLOPramide (REGLAN) 5 MG tablet Take 1 tablet (5 mg total) by mouth every 8 (eight) hours as needed for up to 10 days for nausea. 04/17/22 09/17/23 Yes Elgergawy, Leana Roe, MD   sacubitril-valsartan (ENTRESTO) 49-51 MG TAKE (1) TABLET BY MOUTH TWICE DAILY. 05/17/23  Yes Jodelle Gross, NP  colesevelam [facility]) 625 MG tablet Take 1,875 mg by mouth 2 (two) times daily with a meal. Noon and night Patient not taking: Reported on 04/27/2023    [provider]  Insulin Pen Needle 32G X 4 MM MISC 1 Device by Does not apply route in the morning, at noon, in the evening, and at bedtime. 12/04/22   Shamleffer, Konrad Dolores, MD  isosorbide mononitrate (IMDUR) 60 MG 24 hr tablet Take 60 mg by mouth daily.    [provider]  nitroGLYCERIN (NITROSTAT) 0.4 MG  SL tablet PLACE 1 TABLET UNDER THE TONGUE EVERY 5 MINUTES AS NEEDED FOR CHEST PAIN (UP TO 3 DOSES) Patient taking differently: Place 0.4 mg under the tongue every 5 (five) minutes x 3 doses as needed for chest pain. 01/02/22   Hilty, Lisette Abu, MD  ONETOUCH ULTRA test strip TESTING 3 TIMES DAILY 06/20/21   [provider]  polyethylene glycol (MIRALAX / GLYCOLAX) 17 g packet Take 17 g by mouth daily. Patient not taking: Reported on 07/16/2023 11/12/20   Noralee Stain, DO    Family History Family History  Problem Relation Age of Onset   Hyperlipidemia Mother    Hypertension Mother    Heart attack Mother    Cancer Father        lung   Hypertension Sister    Diabetes Brother    Heart disease Brother    Hyperlipidemia Brother    Hypertension Brother    Heart attack Brother    Other Brother        DVT    Social History Social History   Tobacco Use   Smoking status: Never   Smokeless tobacco: Never  Vaping Use   Vaping status: Never Used  Substance Use Topics   Alcohol use: Yes    Comment: rare   Drug use: No     Allergies   Penicillins   Review of Systems Review of Systems Per HPI  Physical Exam Triage Vital Signs ED Triage Vitals  Encounter Vitals Group     BP 09/17/23 1327 (!) 185/74     Systolic BP Percentile --      Diastolic BP Percentile --      Pulse Rate  09/17/23 1327 (!) 51     Resp 09/17/23 1327 16     Temp 09/17/23 1327 98.6 F (37 C)     Temp Source 09/17/23 1327 Oral     SpO2 09/17/23 1327 94 %     Weight --      Height --      Head Circumference --      Peak Flow --      Pain Score 09/17/23 1329 0     Pain Loc --      Pain Education --      Exclude from Growth Chart --    No data found.  Updated Vital Signs BP (!) 185/74 (BP Location: Left Arm)   Pulse (!) 51   Temp 98.6 F (37 C) (Oral)   Resp 16   SpO2 94%   Visual Acuity Right Eye Distance:   Left Eye Distance:   Bilateral Distance:    Right Eye Near:   Left Eye Near:    Bilateral Near:     Physical Exam Vitals and nursing note reviewed.  Constitutional:      General: She is not in acute distress.    Appearance: Normal appearance. She is not toxic-appearing.  HENT:     Head: Normocephalic and atraumatic.     Right Ear: External ear normal.     Left Ear: External ear normal.     Mouth/Throat:     Mouth: Mucous membranes are moist.     Pharynx: Oropharynx is clear.  Eyes:     General: No scleral icterus.    Extraocular Movements: Extraocular movements intact.  Cardiovascular:     Rate and Rhythm: Bradycardia present.  Pulmonary:     Effort: Pulmonary effort is normal. No respiratory distress.     Breath sounds: No wheezing  or rales.  Skin:    General: Skin is warm and dry.     Capillary Refill: Capillary refill takes less than 2 seconds.     Coloration: Skin is not jaundiced or pale.     Findings: No erythema.  Neurological:     Mental Status: She is alert and oriented to person, place, and time.     Cranial Nerves: No dysarthria or facial asymmetry.     Comments: Examination abbreviated due to severity of illness today; patient walks with walker at baseline  Psychiatric:        Behavior: Behavior is cooperative.      UC Treatments / Results  Labs (all labs ordered are listed, but only abnormal results are displayed) Labs Reviewed - No  data to display  EKG   Radiology No results found.  Procedures Procedures (including critical care time)  Medications Ordered in UC Medications - No data to display  Initial Impression / Assessment and Plan / UC Course  I have reviewed the triage vital signs and the nursing notes.  Pertinent labs & imaging results that were available during my care of the patient were reviewed by me and considered in my medical decision making (see chart for details).   In triage, patient is hypertensive and heart rate is low, otherwise vital signs are stable.  1. Elevated blood pressure reading in office with diagnosis of hypertension 2. Dizziness 3. Nausea without vomiting EKG today shows sinus bradycardia with a ventricular rate of 47 bpm; no apparent new T wave or ST abnormalities Given onset of symptoms, elevated blood pressure, I recommended further evaluation and management in emergency room to rule out emergent cause Patient is in agreement to plan and is safe to transport via private vehicle at this time  The patient was given the opportunity to ask questions.  All questions answered to their satisfaction.  The patient is in agreement to this plan.   Final Clinical Impressions(s) / UC Diagnoses   Final diagnoses:  Elevated blood pressure reading in office with diagnosis of hypertension  Dizziness  Nausea without vomiting     Discharge Instructions      I recommend further evaluation and management in the ER for your symptoms.  Please go straight to the Emergency Room.   ED Prescriptions   None    PDMP not reviewed this encounter.   Valentino Nose, NP 09/17/23 [id number]

## 2023-09-17 NOTE — Assessment & Plan Note (Addendum)
 HTN associated with end organ injury manifesting as troponin elevation and headaches. And chest pain. And pulm edema on Cxr. C.w. ntg infusion startd in ER. Target SBP under 190 mmhg as long as DBP over 75.  Check TSH, serum metanephrines and urine drug screen and serum aldosterone, renin.  C/w home amlodipine (increase dose to 10 mg daily, start now as patient has not been taking it at home) . C.w. imdur. Hold eliquis till BP better controlled

## 2023-09-17 NOTE — ED Provider Triage Note (Signed)
 Emergency Medicine Provider Triage Evaluation Note  Cassandra Adams , a 78 y.o. female  was evaluated in triage.  Pt complains of high blood pressure with some dizziness, nausea, mild headache.,  She denies any dysuria or decreased urination, denies any chest pain, shortness of breath.  She takes blood pressure medication at baseline, reports no missed doses.  She reports that normally is a little bit high even despite.  Review of Systems  Positive: Headache, dizziness, nausea Negative:   Physical Exam  BP (!) 146/59 (BP Location: Left Arm)   Pulse (!) 50   Temp 98.8 F (37.1 C) (Oral)   Resp 16   Ht 5\' 6"  (1.676 m)   Wt 73.3 kg   SpO2 98%   BMI 26.08 kg/m  Gen:   Awake, no distress   Resp:  Normal effort  MSK:   Moves extremities without difficulty  Other:  Moves all 4 limbs spontaneously, CN II through XII grossly intact, can ambulate without difficulty, intact sensation throughout.  Medical Decision Making  Medically screening exam initiated at 4:37 PM.  Appropriate orders placed.  Cassandra Adams was informed that the remainder of the evaluation will be completed by another provider, this initial triage assessment does not replace that evaluation, and the importance of remaining in the ED until their evaluation is complete.  Workup initiated in triage    Olene Floss, PA-C 09/17/23 1640

## 2023-09-17 NOTE — Assessment & Plan Note (Signed)
 C/w entresto. Given ongoing sinus bradycardia. Hold coreg.

## 2023-09-17 NOTE — ED Triage Notes (Signed)
 Pt arrived via POV c/o hypertension and dizziness since last week. Pt reports being seen at Urgent Care earlier and being sen to the ER for further evaluation.

## 2023-09-17 NOTE — H&P (Addendum)
 History and Physical    Patient: Cassandra Adams GNF:[id number] DOB: [birth year] DOA: 09/17/2023 DOS: the patient was seen and examined on 09/17/2023 PCP: Assunta Found, MD  Patient coming from: Home  Chief Complaint:  Chief Complaint  Patient presents with   Hypertension   HPI: Cassandra Adams is a 78 y.o. female with medical history significant of HTN, generally described well controlle dHTN and mostly takes her BP meds regualrly (except amlodipine that she ran out of recently).  Patinet was in her usual state of health till about a week ago when she report a new onset bifrontal/temporal headache mild-moderate. No vision changes no vomting, no fever. Lasted 1-2 days. Was a.w. SBP over 200 at home and remitted.   However, abotu 3 days ago she had recurent of above syndrome of headache and BP values 200/100 at home. Again no focal weakness, no fever, no vomting, no back pain, no other trama etc. No sob. Patinet initially went to urgent care and then transferred to [facility] ER. Had mild chest pain in the ER.   S.p NTG infusion. BP improved, headache and chest pain improved. But not resolved. Medical eval is sought.  Review of Systems: As mentioned in the history of present illness. All other systems reviewed and are negative. Past Medical History:  Diagnosis Date   (HFpEF) heart failure with preserved ejection fraction (HCC)    Limited Echo 7/22: Mild asymmetrical LVH due to scar and thinning of basal posterior wall and background conc LVH, apical and mid segments hyperdynamic, EF 60-65, basal inf-lat and basal inf AK, normal RVSF, RVSP 26.6, mild LAE, mild-moderate MR, mild AV sclerosis w/o AS // Echo 5/22: EF 55-60, RVSP 27, mild to mod LAE, poss small to mod eff; AV sclerosis no AS   Arthritis    "all over"   CAD (coronary artery disease)    a. NSTEMI 12/2013 - occluded dominant RCA with L-R collaterals, moderate prox segmental LAD disease, for medical therapy initially, EF 60% with  subtle inferobasal hypokinesia. Consider PCI for refractory CP.   CKD (chronic kidney disease), stage III (HCC)    Hypertension    Migraines    "weekly" (01/06/2014)   Sickle cell trait (HCC)    TIA (transient ischemic attack) 1978   Type II diabetes mellitus (HCC)    Vertigo    Past Surgical History:  Procedure Laterality Date   APPENDECTOMY  March 2014   had appendix frozen   CARDIAC CATHETERIZATION  "years ago" & 01/05/2014   CARDIOVERSION N/A 03/17/2014   Procedure: CARDIOVERSION;  Surgeon: Chrystie Nose, MD;  Location: [facility] ENDOSCOPY;  Service: Cardiovascular;  Laterality: N/A;   CARDIOVERSION N/A 01/14/2021   Procedure: CARDIOVERSION;  Surgeon: Chrystie Nose, MD;  Location: [facility] ENDOSCOPY;  Service: Cardiovascular;  Laterality: N/A;   CARDIOVERSION N/A 12/13/2021   Procedure: CARDIOVERSION;  Surgeon: Vesta Mixer, MD;  Location: [facility] ENDOSCOPY;  Service: Cardiovascular;  Laterality: N/A;   CATARACT EXTRACTION W/ INTRAOCULAR LENS  IMPLANT, BILATERAL Bilateral 04/2013-05/2013   CORONARY ATHERECTOMY N/A 08/05/2021   Procedure: CORONARY ATHERECTOMY;  Surgeon: Tonny Bollman, MD;  Location: [facility] INVASIVE CV LAB;  Service: Cardiovascular;  Laterality: N/A;   CORONARY PRESSURE/FFR STUDY N/A 08/04/2021   Procedure: INTRAVASCULAR PRESSURE WIRE/FFR STUDY;  Surgeon: Yvonne Kendall, MD;  Location: [facility] INVASIVE CV LAB;  Service: Cardiovascular;  Laterality: N/A;   CORONARY STENT INTERVENTION N/A 08/05/2021   Procedure: CORONARY STENT INTERVENTION;  Surgeon: Tonny Bollman, MD;  Location: [facility] INVASIVE CV LAB;  Service: Cardiovascular;  Laterality: N/A;   ENDARTERECTOMY Right 02/05/2017   Procedure: ENDARTERECTOMY CAROTID-RIGHT;  Surgeon: Sherren Kerns, MD;  Location: [facility] OR;  Service: Vascular;  Laterality: Right;   ESOPHAGOGASTRODUODENOSCOPY  11/08/2007    Normal esophagus without evidence of Barrett, mass, erosion/ Normal stomach, duodenal bulb   ESOPHAGOGASTRODUODENOSCOPY (EGD) WITH PROPOFOL  N/A 11/08/2020   Procedure: ESOPHAGOGASTRODUODENOSCOPY (EGD) WITH PROPOFOL;  Surgeon: Tressia Danas, MD;  Location: [facility] ENDOSCOPY;  Service: Gastroenterology;  Laterality: N/A;   GASTRIC MOTILITY STUDY  11/13/2007   mildly delayed emptying subjectively, but normal  analysis-77% of tracer emptied at 2 hours   JOINT REPLACEMENT     LAPAROSCOPIC APPENDECTOMY N/A 09/15/2012   Procedure: APPENDECTOMY LAPAROSCOPIC;  Surgeon: Dalia Heading, MD;  Location: [facility] ORS;  Service: General;  Laterality: N/A;   LEFT HEART CATH AND CORONARY ANGIOGRAPHY N/A 08/04/2021   Procedure: LEFT HEART CATH AND CORONARY ANGIOGRAPHY;  Surgeon: Yvonne Kendall, MD;  Location: [facility] INVASIVE CV LAB;  Service: Cardiovascular;  Laterality: N/A;   LEFT HEART CATHETERIZATION WITH CORONARY ANGIOGRAM N/A 01/05/2014   Procedure: LEFT HEART CATHETERIZATION WITH CORONARY ANGIOGRAM;  Surgeon: Runell Gess, MD;  Location: [facility] CATH LAB;  Service: Cardiovascular;  Laterality: N/A;   REPLACEMENT TOTAL KNEE Right 05-03-06   TEE WITHOUT CARDIOVERSION N/A 03/17/2014   Procedure: TRANSESOPHAGEAL ECHOCARDIOGRAM (TEE);  Surgeon: Chrystie Nose, MD;  Location: [facility] ENDOSCOPY;  Service: Cardiovascular;  Laterality: N/A;   TONSILLECTOMY  1972   Social History:  reports that she has never smoked. She has never used smokeless tobacco. She reports current alcohol use. She reports that she does not use drugs.  Allergies  Allergen Reactions   Penicillins Hives    Has patient had a PCN reaction causing immediate rash, facial/tongue/throat swelling, SOB or lightheadedness with hypotension: no Has patient had a PCN reaction causing severe rash involving mucus membranes or skin necrosis: No  Has patient had a PCN reaction that required hospitalization: no Has patient had a PCN reaction occurring within the last 10 years: no If all of the above answers are "NO", then may proceed with Cephalosporin use.     Family History  Problem Relation Age of Onset    Hyperlipidemia Mother    Hypertension Mother    Heart attack Mother    Cancer Father        lung   Hypertension Sister    Diabetes Brother    Heart disease Brother    Hyperlipidemia Brother    Hypertension Brother    Heart attack Brother    Other Brother        DVT    Prior to Admission medications   Medication Sig Start Date End Date Taking? Authorizing Provider  amiodarone (PACERONE) 200 MG tablet Take 1 tablet (200 mg total) by mouth daily. Start taking from 12/23/21 12/23/21   Meredeth Ide, MD  amLODipine (NORVASC) 5 MG tablet Take 1 tablet (5 mg total) by mouth daily. 10/18/22   Hilty, Lisette Abu, MD  atorvastatin (LIPITOR) 80 MG tablet Take 1 tablet (80 mg total) by mouth every evening. 12/21/20   Hilty, Lisette Abu, MD  BD INSULIN SYRINGE U/F 31G X 5/16" 0.5 ML MISC Use to take with Novolog [facility] [place] 01/07/21   Roma Kayser, MD  carvedilol (COREG) 3.125 MG tablet Take 1 tablet (3.125 mg total) by mouth 2 (two) times daily with a meal. 10/18/22   Hilty, Lisette Abu, MD  clopidogrel (PLAVIX) 75 MG tablet Take 1 tablet (75  mg total) by mouth daily. 09/17/21   Catarina Hartshorn, MD  colesevelam [facility]) 625 MG tablet Take 1,875 mg by mouth 2 (two) times daily with a meal. Noon and night Patient not taking: Reported on 04/27/2023    [provider]  Continuous Glucose Sensor (DEXCOM G7 SENSOR) MISC 1 Device by Does not apply route as directed. 07/16/23   Shamleffer, Konrad Dolores, MD  ELIQUIS 5 MG TABS tablet TAKE (1) TABLET BY MOUTH TWICE DAILY. 05/24/22   Hilty, Lisette Abu, MD  empagliflozin (JARDIANCE) 10 MG TABS tablet Take 1 tablet (10 mg total) by mouth daily. 12/04/22   Shamleffer, Konrad Dolores, MD  furosemide (LASIX) 40 MG tablet Take 1.5 tablets (60 mg total) by mouth daily. 01/05/23 12/31/23  Jodelle Gross, NP  insulin aspart (NOVOLOG FLEXPEN) 100 UNIT/ML FlexPen Max daily 30 units 12/04/22   Shamleffer, Konrad Dolores, MD  Insulin Glargine (BASAGLAR KWIKPEN) 100 UNIT/ML  Inject 28 Units into the skin daily. 12/04/22   Shamleffer, Konrad Dolores, MD  Insulin Pen Needle 32G X 4 MM MISC 1 Device by Does not apply route in the morning, at noon, in the evening, and at bedtime. 12/04/22   Shamleffer, Konrad Dolores, MD  isosorbide mononitrate (IMDUR) 60 MG 24 hr tablet Take 60 mg by mouth daily.    [provider]  metoCLOPramide (REGLAN) 5 MG tablet Take 1 tablet (5 mg total) by mouth every 8 (eight) hours as needed for up to 10 days for nausea. 04/17/22 09/17/23  Elgergawy, Leana Roe, MD  nitroGLYCERIN (NITROSTAT) 0.4 MG SL tablet PLACE 1 TABLET UNDER THE TONGUE EVERY 5 MINUTES AS NEEDED FOR CHEST PAIN (UP TO 3 DOSES) Patient taking differently: Place 0.4 mg under the tongue every 5 (five) minutes x 3 doses as needed for chest pain. 01/02/22   Hilty, Lisette Abu, MD  ONETOUCH ULTRA test strip TESTING 3 TIMES DAILY 06/20/21   [provider]  polyethylene glycol (MIRALAX / GLYCOLAX) 17 g packet Take 17 g by mouth daily. Patient not taking: Reported on 07/16/2023 11/12/20   Noralee Stain, DO  sacubitril-valsartan (ENTRESTO) 49-51 MG TAKE (1) TABLET BY MOUTH TWICE DAILY. 05/17/23   Jodelle Gross, NP    Physical Exam: Vitals:   09/17/23 2145 09/17/23 2151 09/17/23 2154 09/17/23 2155  BP: 137/69 (!) 167/68 (!) 154/67   Pulse: (!) 43 (!) 44 (!) 43 (!) 44  Resp: 18 19 18 20   Temp:      TempSrc:      SpO2: 97% 97% 96% 97%  Weight:      Height:       Gnereal - AAOX 3, daughter at bedside. No apparent distress. Telemetry shows sinus brady Resp - b/l a/e vesicualr Cvs-s1s2 normal Abodmen - soft non tedner Extremity - warm no edema. No focal deficit. Data Reviewed:  Labs on Admission:  Results for orders placed or performed during the hospital encounter of 09/17/23 (from the past 24 hours)  Troponin I (High Sensitivity)     Status: Abnormal   Collection Time: 09/17/23  4:27 PM  Result Value Ref Range   Troponin I (High Sensitivity) 46 (H) <18  ng/L  Brain natriuretic peptide     Status: Abnormal   Collection Time: 09/17/23  4:27 PM  Result Value Ref Range   B Natriuretic Peptide 657.0 (H) 0.0 - 100.0 pg/mL  CBC with Differential     Status: Abnormal   Collection Time: 09/17/23  4:27 PM  Result Value Ref Range  WBC 3.1 (L) 4.0 - 10.5 K/uL   RBC 4.46 3.87 - 5.11 MIL/uL   Hemoglobin 12.5 12.0 - 15.0 g/dL   HCT 16.1 09.6 - 04.5 %   MCV 83.2 80.0 - 100.0 fL   MCH 28.0 26.0 - 34.0 pg   MCHC 33.7 30.0 - 36.0 g/dL   RDW 40.9 81.1 - 91.4 %   Platelets 197 150 - 400 K/uL   nRBC 0.0 0.0 - 0.2 %   Neutrophils Relative % 50 %   Neutro Abs 1.6 (L) 1.7 - 7.7 K/uL   Lymphocytes Relative 34 %   Lymphs Abs 1.0 0.7 - 4.0 K/uL   Monocytes Relative 11 %   Monocytes Absolute 0.3 0.1 - 1.0 K/uL   Eosinophils Relative 4 %   Eosinophils Absolute 0.1 0.0 - 0.5 K/uL   Basophils Relative 1 %   Basophils Absolute 0.0 0.0 - 0.1 K/uL   Immature Granulocytes 0 %   Abs Immature Granulocytes 0.01 0.00 - 0.07 K/uL  Comprehensive metabolic panel     Status: Abnormal   Collection Time: 09/17/23  4:27 PM  Result Value Ref Range   Sodium 140 135 - 145 mmol/L   Potassium 3.8 3.5 - 5.1 mmol/L   Chloride 102 98 - 111 mmol/L   CO2 29 22 - 32 mmol/L   Glucose, Bld 236 (H) 70 - 99 mg/dL   BUN 25 (H) 8 - 23 mg/dL   Creatinine, Ser 7.82 (H) 0.44 - 1.00 mg/dL   Calcium 9.3 8.9 - 95.6 mg/dL   Total Protein 6.6 6.5 - 8.1 g/dL   Albumin 3.3 (L) 3.5 - 5.0 g/dL   AST 29 15 - 41 U/L   ALT 19 0 - 44 U/L   Alkaline Phosphatase 64 38 - 126 U/L   Total Bilirubin 0.5 0.0 - 1.2 mg/dL   GFR, Estimated 32 (L) >60 mL/min   Anion gap 9 5 - 15  Troponin I (High Sensitivity)     Status: Abnormal   Collection Time: 09/17/23  6:35 PM  Result Value Ref Range   Troponin I (High Sensitivity) 45 (H) <18 ng/L  Troponin I (High Sensitivity)     Status: Abnormal   Collection Time: 09/17/23  8:17 PM  Result Value Ref Range   Troponin I (High Sensitivity) 41 (H) <18 ng/L    Basic Metabolic Panel: Recent Labs  Lab 09/17/23 1627  NA 140  K 3.8  CL 102  CO2 29  GLUCOSE 236*  BUN 25*  CREATININE 1.63*  CALCIUM 9.3   Liver Function Tests: Recent Labs  Lab 09/17/23 1627  AST 29  ALT 19  ALKPHOS 64  BILITOT 0.5  PROT 6.6  ALBUMIN 3.3*   No results for input(s): "LIPASE", "AMYLASE" in the last 168 hours. No results for input(s): "AMMONIA" in the last 168 hours. CBC: Recent Labs  Lab 09/17/23 1627  WBC 3.1*  NEUTROABS 1.6*  HGB 12.5  HCT 37.1  MCV 83.2  PLT 197   Cardiac Enzymes: Recent Labs  Lab 09/17/23 1627 09/17/23 1835 09/17/23 2017  TROPONINIHS 46* 45* 41*    BNP (last 3 results) No results for input(s): "PROBNP" in the last 8760 hours. CBG: No results for input(s): "GLUCAP" in the last 168 hours.  Radiological Exams on Admission:  DG Chest 2 View Result Date: 09/17/2023 CLINICAL DATA:  Chest pain. High blood pressure with dizziness, nausea, and headache. EXAM: CHEST - 2 VIEW COMPARISON:  12/09/2021 FINDINGS: Shallow inspiration. Cardiac enlargement with  hazy perihilar and basilar infiltrates likely representing edema but could be pneumonia. No pleural effusion or pneumothorax. Mediastinal contours appear intact. Calcification of the aorta. IMPRESSION: Cardiac enlargement. Perihilar and basilar infiltrates suggesting edema or pneumonia. Electronically Signed   By: Burman Nieves M.D.   On: 09/17/2023 19:14   CT Head Wo Contrast Result Date: 09/17/2023 CLINICAL DATA:  Headache, new onset (Age >= 51y) EXAM: CT HEAD WITHOUT CONTRAST TECHNIQUE: Contiguous axial images were obtained from the base of the skull through the vertex without intravenous contrast. RADIATION DOSE REDUCTION: This exam was performed according to the departmental dose-optimization program which includes automated exposure control, adjustment of the mA and/or kV according to patient size and/or use of iterative reconstruction technique. COMPARISON:  CT head  04/07/2022 FINDINGS: Brain: No evidence of large-territorial acute infarction. No parenchymal hemorrhage. No mass lesion. No extra-axial collection. No mass effect or midline shift. No hydrocephalus. Basilar cisterns are patent. Vascular: No hyperdense vessel. Atherosclerotic calcifications are present within the cavernous internal carotid and vertebral arteries. Skull: No acute fracture or focal lesion. Sinuses/Orbits: Paranasal sinuses and mastoid air cells are clear. Bilateral lens replacement. Otherwise the orbits are unremarkable. Other: None. IMPRESSION: No acute intracranial abnormality. Electronically Signed   By: Tish Frederickson M.D.   On: 09/17/2023 19:12    chest X-ray  EKG: Independently reviewed. See below troponin elevation  No intake/output data recorded. No intake/output data recorded.    Assessment and Plan: * Hypertensive emergency HTN associated with end organ injury manifesting as troponin elevation and headaches. And chest pain. And pulm edema on Cxr. C.w. ntg infusion startd in ER. Target SBP under 190 mmhg as long as DBP over 75.  Check TSH, serum metanephrines and urine drug screen and serum aldosterone, renin.  C/w home amlodipine (increase dose to 10 mg daily, start now as patient has not been taking it at home) . C.w. imdur. Hold eliquis till BP better controlled  Chronic systolic CHF (congestive heart failure) (HCC) C/w entresto. Given ongoing sinus bradycardia. Hold coreg.   Leukopenia Incidental . Check b12, foalte  Troponin I above reference range Mulitple EKG obtained at 1:30 PM. 2PM. 4PM and 8 PM. No dynamic changes to suggest ACS. Troponin flat. Chest pain is due to HTN urgency itself. C.w. clincal monitoirng.  A-fib [facility]) This is chronic. Currently sinus brady cardia. Hold apixaban tonight till we have better BP control. Therefore hold coreg as well.  Chronic headaches With acute episode right now. Treat with acetaminophen. CT head wnl.   DVT ppx  lovenox ordered.    Advance Care Planning:   Code Status: Full Code   Consults: none  Family Communication: at bedside. All questions answered.  Severity of Illness: The appropriate patient status for this patient is INPATIENT. Inpatient status is judged to be reasonable and necessary in order to provide the required intensity of service to ensure the patient's safety. The patient's presenting symptoms, physical exam findings, and initial radiographic and laboratory data in the context of their chronic comorbidities is felt to place them at high risk for further clinical deterioration. Furthermore, it is not anticipated that the patient will be medically stable for discharge from the hospital within 2 midnights of admission.   * I certify that at the point of admission it is my clinical judgment that the patient will require inpatient hospital care spanning beyond 2 midnights from the point of admission due to high intensity of service, high risk for further deterioration and high frequency of surveillance  required.*  Author: Nolberto Hanlon, MD 09/17/2023 10:15 PM  For on call review [email or web address].

## 2023-09-17 NOTE — Assessment & Plan Note (Signed)
 Mulitple EKG obtained at 1:30 PM. 2PM. 4PM and 8 PM. No dynamic changes to suggest ACS. Troponin flat. Chest pain is due to HTN urgency itself. C.w. clincal monitoirng.

## 2023-09-17 NOTE — ED Provider Notes (Signed)
 [place] EMERGENCY DEPARTMENT AT [facility] Provider Note   CSN: [id number] Arrival date & time: 09/17/23  1520     History  Chief Complaint  Patient presents with   Hypertension    Cassandra Adams is a 78 y.o. female with a history of chronic atrial fibrillation, CHF, NSTEMI, TIA, and diabetes mellitus presents the ED today for hypertension.  Patient reports waking up feeling dizzy this morning.  She took her blood pressure and it was in the 200s/100s so she went to urgent care who sent her here for further evaluation.  She endorses associated headaches but denies any vision changes, weakness, chest pain, or shortness of breath.  At the time of my evaluation patient reports that her dizziness has resolved and that her headache has improved but still is there.  Her blood pressures also in the 140s over 50s range at this time.  Denies any bloating of the abdomen or swelling of the legs.  No additional complaints or concerns at this time.   Home Medications Prior to Admission medications   Medication Sig Start Date End Date Taking? Authorizing Provider  amiodarone (PACERONE) 200 MG tablet Take 1 tablet (200 mg total) by mouth daily. Start taking from 12/23/21 12/23/21   Meredeth Ide, MD  amLODipine (NORVASC) 5 MG tablet Take 1 tablet (5 mg total) by mouth daily. 10/18/22   Hilty, Lisette Abu, MD  atorvastatin (LIPITOR) 80 MG tablet Take 1 tablet (80 mg total) by mouth every evening. 12/21/20   Hilty, Lisette Abu, MD  BD INSULIN SYRINGE U/F 31G X 5/16" 0.5 ML MISC Use to take with Novolog [facility] 01/07/21   Roma Kayser, MD  carvedilol (COREG) 3.125 MG tablet Take 1 tablet (3.125 mg total) by mouth 2 (two) times daily with a meal. 10/18/22   Hilty, Lisette Abu, MD  clopidogrel (PLAVIX) 75 MG tablet Take 1 tablet (75 mg total) by mouth daily. 09/17/21   Catarina Hartshorn, MD  colesevelam [facility]) 625 MG tablet Take 1,875 mg by mouth 2 (two) times daily with a meal. Noon and  night Patient not taking: Reported on 04/27/2023    [provider]  Continuous Glucose Sensor (DEXCOM G7 SENSOR) MISC 1 Device by Does not apply route as directed. 07/16/23   Shamleffer, Konrad Dolores, MD  ELIQUIS 5 MG TABS tablet TAKE (1) TABLET BY MOUTH TWICE DAILY. 05/24/22   Hilty, Lisette Abu, MD  empagliflozin (JARDIANCE) 10 MG TABS tablet Take 1 tablet (10 mg total) by mouth daily. 12/04/22   Shamleffer, Konrad Dolores, MD  furosemide (LASIX) 40 MG tablet Take 1.5 tablets (60 mg total) by mouth daily. 01/05/23 12/31/23  Jodelle Gross, NP  insulin aspart (NOVOLOG FLEXPEN) 100 UNIT/ML FlexPen Max daily 30 units 12/04/22   Shamleffer, Konrad Dolores, MD  Insulin Glargine (BASAGLAR KWIKPEN) 100 UNIT/ML Inject 28 Units into the skin daily. 12/04/22   Shamleffer, Konrad Dolores, MD  Insulin Pen Needle 32G X 4 MM MISC 1 Device by Does not apply route in the morning, at noon, in the evening, and at bedtime. 12/04/22   Shamleffer, Konrad Dolores, MD  isosorbide mononitrate (IMDUR) 60 MG 24 hr tablet Take 60 mg by mouth daily.    [provider]  metoCLOPramide (REGLAN) 5 MG tablet Take 1 tablet (5 mg total) by mouth every 8 (eight) hours as needed for up to 10 days for nausea. 04/17/22 09/17/23  Elgergawy, Leana Roe, MD  nitroGLYCERIN (NITROSTAT) 0.4 MG SL tablet PLACE 1  TABLET UNDER THE TONGUE EVERY 5 MINUTES AS NEEDED FOR CHEST PAIN (UP TO 3 DOSES) Patient taking differently: Place 0.4 mg under the tongue every 5 (five) minutes x 3 doses as needed for chest pain. 01/02/22   Hilty, Lisette Abu, MD  ONETOUCH ULTRA test strip TESTING 3 TIMES DAILY 06/20/21   [provider]  polyethylene glycol (MIRALAX / GLYCOLAX) 17 g packet Take 17 g by mouth daily. Patient not taking: Reported on 07/16/2023 11/12/20   Noralee Stain, DO  sacubitril-valsartan (ENTRESTO) 49-51 MG TAKE (1) TABLET BY MOUTH TWICE DAILY. 05/17/23   Jodelle Gross, NP      Allergies    Penicillins    Review  of Systems   Review of Systems  Neurological:  Positive for headaches.  All other systems reviewed and are negative.   Physical Exam Updated Vital Signs BP (!) 149/65   Pulse (!) 43   Temp 97.9 F (36.6 C) (Oral)   Resp (!) 22   Ht 5\' 6"  (1.676 m)   Wt 73.3 kg   SpO2 99%   BMI 26.08 kg/m  Physical Exam Vitals and nursing note reviewed.  Constitutional:      General: She is not in acute distress.    Appearance: Normal appearance.  HENT:     Head: Normocephalic and atraumatic.     Mouth/Throat:     Mouth: Mucous membranes are moist.  Eyes:     Conjunctiva/sclera: Conjunctivae normal.     Pupils: Pupils are equal, round, and reactive to light.  Cardiovascular:     Rate and Rhythm: Normal rate and regular rhythm.     Pulses: Normal pulses.     Heart sounds: Normal heart sounds.  Pulmonary:     Effort: Pulmonary effort is normal.     Breath sounds: Normal breath sounds.  Abdominal:     Palpations: Abdomen is soft.     Tenderness: There is no abdominal tenderness.  Musculoskeletal:        General: Normal range of motion.     Cervical back: Normal range of motion.     Right lower leg: Edema present.     Left lower leg: Edema present.     Comments: Slight swelling to the bilateral lower extremities  Skin:    General: Skin is warm and dry.     Findings: No rash.  Neurological:     General: No focal deficit present.     Mental Status: She is alert.     Sensory: No sensory deficit.     Motor: No weakness.  Psychiatric:        Mood and Affect: Mood normal.        Behavior: Behavior normal.    ED Results / Procedures / Treatments   Labs (all labs ordered are listed, but only abnormal results are displayed) Labs Reviewed  BRAIN NATRIURETIC PEPTIDE - Abnormal; Notable for the following components:      Result Value   B Natriuretic Peptide 657.0 (*)    All other components within normal limits  CBC WITH DIFFERENTIAL/PLATELET - Abnormal; Notable for the following  components:   WBC 3.1 (*)    Neutro Abs 1.6 (*)    All other components within normal limits  COMPREHENSIVE METABOLIC PANEL - Abnormal; Notable for the following components:   Glucose, Bld 236 (*)    BUN 25 (*)    Creatinine, Ser 1.63 (*)    Albumin 3.3 (*)    GFR, Estimated 32 (*)  All other components within normal limits  TROPONIN I (HIGH SENSITIVITY) - Abnormal; Notable for the following components:   Troponin I (High Sensitivity) 46 (*)    All other components within normal limits  TROPONIN I (HIGH SENSITIVITY) - Abnormal; Notable for the following components:   Troponin I (High Sensitivity) 45 (*)    All other components within normal limits  TROPONIN I (HIGH SENSITIVITY) - Abnormal; Notable for the following components:   Troponin I (High Sensitivity) 41 (*)    All other components within normal limits    EKG EKG Interpretation Date/Time:  Monday September 17 2023 16:02:46 EDT Ventricular Rate:  55 PR Interval:  174 QRS Duration:  98 QT Interval:  462 QTC Calculation: 441 R Axis:   17  Text Interpretation: Sinus bradycardia Low voltage QRS Septal infarct , age undetermined Confirmed by Gloris Manchester [id number]) on 09/17/2023 7:11:26 PM  Radiology DG Chest 2 View Result Date: 09/17/2023 CLINICAL DATA:  Chest pain. High blood pressure with dizziness, nausea, and headache. EXAM: CHEST - 2 VIEW COMPARISON:  12/09/2021 FINDINGS: Shallow inspiration. Cardiac enlargement with hazy perihilar and basilar infiltrates likely representing edema but could be pneumonia. No pleural effusion or pneumothorax. Mediastinal contours appear intact. Calcification of the aorta. IMPRESSION: Cardiac enlargement. Perihilar and basilar infiltrates suggesting edema or pneumonia. Electronically Signed   By: Burman Nieves M.D.   On: 09/17/2023 19:14   CT Head Wo Contrast Result Date: 09/17/2023 CLINICAL DATA:  Headache, new onset (Age >= 51y) EXAM: CT HEAD WITHOUT CONTRAST TECHNIQUE: Contiguous axial images  were obtained from the base of the skull through the vertex without intravenous contrast. RADIATION DOSE REDUCTION: This exam was performed according to the departmental dose-optimization program which includes automated exposure control, adjustment of the mA and/or kV according to patient size and/or use of iterative reconstruction technique. COMPARISON:  CT head 04/07/2022 FINDINGS: Brain: No evidence of large-territorial acute infarction. No parenchymal hemorrhage. No mass lesion. No extra-axial collection. No mass effect or midline shift. No hydrocephalus. Basilar cisterns are patent. Vascular: No hyperdense vessel. Atherosclerotic calcifications are present within the cavernous internal carotid and vertebral arteries. Skull: No acute fracture or focal lesion. Sinuses/Orbits: Paranasal sinuses and mastoid air cells are clear. Bilateral lens replacement. Otherwise the orbits are unremarkable. Other: None. IMPRESSION: No acute intracranial abnormality. Electronically Signed   By: Tish Frederickson M.D.   On: 09/17/2023 19:12    Procedures Procedures: not indicated.   Medications Ordered in ED Medications  nitroGLYCERIN 50 mg in dextrose 5 % 250 mL (0.2 mg/mL) infusion (50 mcg/min Intravenous New Bag/Given 09/17/23 2135)  acetaminophen (TYLENOL) tablet 650 mg (650 mg Oral Given 09/17/23 1923)  aspirin tablet 325 mg (325 mg Oral Given 09/17/23 2007)  furosemide (LASIX) injection 40 mg (40 mg Intravenous Given 09/17/23 2020)  metoCLOPramide (REGLAN) injection 10 mg (10 mg Intravenous Given 09/17/23 2132)  diphenhydrAMINE (BENADRYL) injection 12.5 mg (12.5 mg Intravenous Given 09/17/23 2132)    ED Course/ Medical Decision Making/ A&P                                 Medical Decision Making Amount and/or Complexity of Data Reviewed Labs: ordered. Radiology: ordered.  Risk OTC drugs. Prescription drug management.   This patient presents to the ED for concern of elevated blood pressure and headache,  this involves an extensive number of treatment options, and is a complaint that carries with it a high risk  of complications and morbidity.   Differential diagnosis includes: Hypertensive emergency, hypertensive urgency, ACS, CHF exacerbation,    Comorbidities  See HPI above   Additional History  Additional history obtained from prior urgent care note   Cardiac Monitoring / EKG  The patient was maintained on a cardiac monitor.  I personally viewed and interpreted the cardiac monitored which showed: Sinus bradycardia with a heart rate of 55 bpm.   Lab Tests  I ordered and personally interpreted labs.  The pertinent results include:   Initial troponin of 46, delta troponin of 45 3rd troponin obtained after chest pain started - level dropped to 41 BNP of 657 Glucose of 236, otherwise CMP and CBC within normal limits   Imaging Studies  CXR and  CT head ordered in triage I independently visualized and interpreted imaging which showed:  Chest x-ray showed cardiac enlargement.  Perihilar and basilar infiltrates suggesting edema or pneumonia. CT head showed no acute intracranial abnormality. I agree with the radiologist interpretation   Consultations  I requested consultation with Dr. Maryjean Ka with [facility],  and discussed lab and imaging findings as well as pertinent plan - they recommend: Admission for further evaluation and management of symptoms.   Problem List / ED Course / Critical Interventions / Medication Management  Patient presents the ED after being sent here by urgent care for elevated blood pressure readings with dizziness and headaches.  No vision changes, weakness, chest pain, or shortness of breath on initial exam.  Blood pressure 146/59 on initial evaluation as well.  Patient states that she took her blood pressure medications in the morning but has not taken them in many hours. I received a message from the nurse shortly after the team saying that patient started to  have midsternal chest pain.  At this time patient's blood pressure raised to 224/90. Patient staffed with my attending, Dr. Durwin Nora. I ordered medications including: Tylenol for headache  Aspirin for chest pain Lasix for CHF Nitroglycerin drip for hypertensive urgency I have reviewed the patients home medicines and have made adjustments as needed   Social Determinants of Health  Housing   Test / Admission - Considered  Discussed findings with patient.  All questions were answered. She is agreeable with plan for admission.       Final Clinical Impression(s) / ED Diagnoses Final diagnoses:  Hypertensive urgency    Rx / DC Orders ED Discharge Orders     None         Maxwell Marion, PA-C 09/17/23 2156    Gloris Manchester, MD 09/17/23 [phone]

## 2023-09-17 NOTE — ED Triage Notes (Signed)
 Pt states this morning when checking her BP it was 213/104 with dizziness and nausea. Pt states the dizziness is better but still feeling nauseous.

## 2023-09-17 NOTE — Assessment & Plan Note (Addendum)
 This is chronic. Currently sinus brady cardia. Hold apixaban tonight till we have better BP control. Therefore hold coreg as well.

## 2023-09-17 NOTE — Discharge Instructions (Signed)
 I recommend further evaluation and management in the ER for your symptoms.  Please go straight to the Emergency Room.

## 2023-09-17 NOTE — Assessment & Plan Note (Signed)
 Incidental . Check b12, foalte

## 2023-09-17 NOTE — ED Notes (Signed)
 Gave pt cranberry juice and [place] sandwich to help get blood sugar up.

## 2023-09-18 ENCOUNTER — Other Ambulatory Visit: Payer: Self-pay

## 2023-09-18 DIAGNOSIS — I161 Hypertensive emergency: Secondary | ICD-10-CM | POA: Diagnosis not present

## 2023-09-18 LAB — CBC
HCT: 33.4 % — ABNORMAL LOW (ref 36.0–46.0)
Hemoglobin: 11.6 g/dL — ABNORMAL LOW (ref 12.0–15.0)
MCH: 28.4 pg (ref 26.0–34.0)
MCHC: 34.7 g/dL (ref 30.0–36.0)
MCV: 81.7 fL (ref 80.0–100.0)
Platelets: 183 10*3/uL (ref 150–400)
RBC: 4.09 MIL/uL (ref 3.87–5.11)
RDW: 15 % (ref 11.5–15.5)
WBC: 3.2 10*3/uL — ABNORMAL LOW (ref 4.0–10.5)
nRBC: 0 % (ref 0.0–0.2)

## 2023-09-18 LAB — CBG MONITORING, ED
Glucose-Capillary: 180 mg/dL — ABNORMAL HIGH (ref 70–99)
Glucose-Capillary: 218 mg/dL — ABNORMAL HIGH (ref 70–99)
Glucose-Capillary: 225 mg/dL — ABNORMAL HIGH (ref 70–99)
Glucose-Capillary: 226 mg/dL — ABNORMAL HIGH (ref 70–99)
Glucose-Capillary: 249 mg/dL — ABNORMAL HIGH (ref 70–99)
Glucose-Capillary: 290 mg/dL — ABNORMAL HIGH (ref 70–99)

## 2023-09-18 LAB — BASIC METABOLIC PANEL
Anion gap: 10 (ref 5–15)
BUN: 26 mg/dL — ABNORMAL HIGH (ref 8–23)
CO2: 27 mmol/L (ref 22–32)
Calcium: 9.1 mg/dL (ref 8.9–10.3)
Chloride: 104 mmol/L (ref 98–111)
Creatinine, Ser: 1.7 mg/dL — ABNORMAL HIGH (ref 0.44–1.00)
GFR, Estimated: 31 mL/min — ABNORMAL LOW (ref >60)
Glucose, Bld: 247 mg/dL — ABNORMAL HIGH (ref 70–99)
Potassium: 3.2 mmol/L — ABNORMAL LOW (ref 3.5–5.1)
Sodium: 141 mmol/L (ref 135–145)

## 2023-09-18 LAB — PROTIME-INR
INR: 1.3 — ABNORMAL HIGH (ref 0.8–1.2)
Prothrombin Time: 15.9 s — ABNORMAL HIGH (ref 11.4–15.2)

## 2023-09-18 LAB — VITAMIN B12: Vitamin B-12: 313 pg/mL (ref 180–914)

## 2023-09-18 LAB — TSH: TSH: 1.79 u[IU]/mL (ref 0.350–4.500)

## 2023-09-18 LAB — FOLATE: Folate: 23.1 ng/mL (ref >5.9)

## 2023-09-18 LAB — APTT: aPTT: 31 s (ref 24–36)

## 2023-09-18 LAB — T4, FREE: Free T4: 1.15 ng/dL — ABNORMAL HIGH (ref 0.61–1.12)

## 2023-09-18 LAB — CORTISOL-AM, BLOOD: Cortisol - AM: 7.8 ug/dL (ref 6.7–22.6)

## 2023-09-18 MED ORDER — AMIODARONE HCL 200 MG PO TABS: 200.0000 mg | ORAL_TABLET | Freq: Every day | ORAL | Status: DC

## 2023-09-18 MED ORDER — APIXABAN 5 MG PO TABS: 5.0000 mg | ORAL_TABLET | Freq: Two times a day (BID) | ORAL | Status: DC

## 2023-09-18 MED ORDER — HYDRALAZINE HCL 20 MG/ML IJ SOLN: 10.0000 mg | INTRAMUSCULAR | Status: DC | PRN

## 2023-09-18 MED ORDER — FUROSEMIDE 40 MG PO TABS
60.0000 mg | ORAL_TABLET | Freq: Every day | ORAL | Status: DC
  Administered 2023-09-18: 60 mg via ORAL
  Filled 2023-09-18: qty 2

## 2023-09-18 MED ORDER — AMLODIPINE BESYLATE 10 MG PO TABS: 10.0000 mg | ORAL_TABLET | Freq: Every day | ORAL | 1 refills | Status: DC

## 2023-09-18 MED ORDER — POTASSIUM CHLORIDE CRYS ER 20 MEQ PO TBCR
40.0000 meq | EXTENDED_RELEASE_TABLET | Freq: Once | ORAL | Status: AC
  Administered 2023-09-18: 40 meq via ORAL
  Filled 2023-09-18: qty 2

## 2023-09-18 MED ORDER — INSULIN GLARGINE-YFGN 100 UNIT/ML ~~LOC~~ SOLN
10.0000 [IU] | Freq: Every day | SUBCUTANEOUS | Status: DC
  Administered 2023-09-18: 10 [IU] via SUBCUTANEOUS
  Filled 2023-09-18 (×2): qty 0.1

## 2023-09-18 MED ORDER — ATORVASTATIN CALCIUM 40 MG PO TABS: 80.0000 mg | ORAL_TABLET | Freq: Every evening | ORAL | Status: DC

## 2023-09-18 MED ORDER — CARVEDILOL 3.125 MG PO TABS
3.1250 mg | ORAL_TABLET | Freq: Two times a day (BID) | ORAL | Status: DC
  Administered 2023-09-18: 3.125 mg via ORAL
  Filled 2023-09-18: qty 1

## 2023-09-18 MED ORDER — EMPAGLIFLOZIN 10 MG PO TABS
10.0000 mg | ORAL_TABLET | Freq: Every day | ORAL | Status: DC
  Administered 2023-09-18: 10 mg via ORAL
  Filled 2023-09-18 (×2): qty 1

## 2023-09-18 MED ORDER — CARVEDILOL 3.125 MG PO TABS
3.1250 mg | ORAL_TABLET | Freq: Two times a day (BID) | ORAL | Status: DC
Start: 2023-09-18 — End: 2023-09-18

## 2023-09-18 MED ORDER — ONDANSETRON HCL 4 MG/2ML IJ SOLN
4.0000 mg | Freq: Once | INTRAMUSCULAR | Status: AC
  Administered 2023-09-18: 4 mg via INTRAVENOUS
  Filled 2023-09-18: qty 2

## 2023-09-18 MED ORDER — CARVEDILOL 3.125 MG PO TABS: 3.1250 mg | ORAL_TABLET | Freq: Two times a day (BID) | ORAL | Status: DC

## 2023-09-18 NOTE — Inpatient Diabetes Management (Signed)
 Inpatient Diabetes Program Recommendations  AACE/ADA: New Consensus Statement on Inpatient Glycemic Control   Target Ranges:  Prepandial:   less than 140 mg/dL      Peak postprandial:   less than 180 mg/dL (1-2 hours)      Critically ill patients:  140 - 180 mg/dL    Latest Reference Range & Units 09/17/23 23:36 09/18/23 00:20 09/18/23 00:51 09/18/23 01:46 09/18/23 04:12 09/18/23 07:45  Glucose-Capillary 70 - 99 mg/dL 46 (L) 161 (H) 096 (H) 226 (H) 249 (H) 218 (H)    Latest Reference Range & Units 09/17/23 16:27 09/18/23 03:41  Glucose 70 - 99 mg/dL 045 (H) 409 (H)   Review of Glycemic Control  Diabetes history: DM2 Outpatient Diabetes medications: Basaglar 28 units daily, Novolog 6 units TID with meals, Jardiance 10 mg daily Current orders for Inpatient glycemic control: Novolog 0-15 units TID with meals, Novolog 0-5 units at bedtime, Jardiance 10 mg daily  Inpatient Diabetes Program Recommendations:    Insulin: If CBGs are consistently over 180 mg/dl, please consider ordering Semglee 10 units daily.  Thanks, Orlando Penner, RN, MSN, CDCES Diabetes Coordinator Inpatient Diabetes Program [phone] (Team Pager from 8am to 5pm)

## 2023-09-18 NOTE — Discharge Summary (Signed)
 Physician Discharge Summary  Cassandra Adams XBM:[id number] DOB: 14-Dec-1945 DOA: 09/17/2023  PCP: Assunta Found, MD  Admit date: 09/17/2023  Discharge date: 09/18/2023  Admitted From:Home  Disposition:  Home  Recommendations for Outpatient Follow-up:  Follow up with PCP in 1-2 weeks follow-up blood pressure readings Prescription for amlodipine 10 mg daily given Continue other home medications as prior  Home Health: None  Equipment/Devices: None  Discharge Condition:Stable  CODE STATUS: Full  Diet recommendation: Heart Healthy  Brief/Interim Summary:  Cassandra Adams is a 78 y.o. female with medical history significant of HTN, generally described well controlled HTN and mostly takes her BP meds regualrly (except amlodipine that she ran out of recently).  She presented with complaints of new onset headache after being seen at urgent care with BP values 200/100.  She was also noted to have some chest pain.  She had been started on nitroglycerin drip for management of hypertensive emergency-which was quickly weaned off when home blood pressure medications were resumed.  She was noted to have some mild bradycardia that was asymptomatic and apparently is something that is baseline normal for her.  She follows up with cardiology and is well aware of this issue.  She has not been started on amlodipine 10 mg instead of 5 mg given elevated blood pressure readings and prescription has been sent.  She is encouraged to continue to remain on her usual medications and follow-up with cardiology and PCP.  No other acute events or concerns noted.  Discharge Diagnoses:  Principal Problem:   Hypertensive emergency Active Problems:   A-fib (HCC)   Troponin I above reference range   Leukopenia   Chronic systolic CHF (congestive heart failure) (HCC)  Principal discharge diagnosis: Hypertensive emergency in the setting of missed amlodipine doses.  Chronic, asymptomatic bradycardia.  Discharge  Instructions  Discharge Instructions     Diet - low sodium heart healthy   Complete by: As directed    Increase activity slowly   Complete by: As directed       Allergies as of 09/18/2023       Reactions   Penicillins Hives   Has patient had a PCN reaction causing immediate rash, facial/tongue/throat swelling, SOB or lightheadedness with hypotension: no Has patient had a PCN reaction causing severe rash involving mucus membranes or skin necrosis: No  Has patient had a PCN reaction that required hospitalization: no Has patient had a PCN reaction occurring within the last 10 years: no If all of the above answers are "NO", then may proceed with Cephalosporin use.        Medication List     STOP taking these medications    amiodarone 200 MG tablet Commonly known as: Pacerone       TAKE these medications    amLODipine 5 MG tablet Commonly known as: NORVASC Take 1 tablet (5 mg total) by mouth daily. What changed: Another medication with the same name was added. Make sure you understand how and when to take each.   amLODipine 10 MG tablet Commonly known as: NORVASC Take 1 tablet (10 mg total) by mouth daily. Start taking on: September 19, 2023 What changed: You were already taking a medication with the same name, and this prescription was added. Make sure you understand how and when to take each.   atorvastatin 80 MG tablet Commonly known as: LIPITOR Take 1 tablet (80 mg total) by mouth every evening.   Basaglar KwikPen 100 UNIT/ML Inject 28 Units into the skin daily.  BD Insulin Syringe U/F 31G X 5/16" 0.5 ML Misc Generic drug: Insulin Syringe-Needle U-100 Use to take with Novolog TIDAC   carvedilol 3.125 MG tablet Commonly known as: COREG Take 1 tablet (3.125 mg total) by mouth 2 (two) times daily with a meal.   clopidogrel 75 MG tablet Commonly known as: PLAVIX Take 1 tablet (75 mg total) by mouth daily.   Dexcom G7 Sensor Misc 1 Device by Does not apply  route as directed.   Eliquis 5 MG Tabs tablet Generic drug: apixaban TAKE (1) TABLET BY MOUTH TWICE DAILY.   empagliflozin 10 MG Tabs tablet Commonly known as: Jardiance Take 1 tablet (10 mg total) by mouth daily.   Entresto 49-51 MG Generic drug: sacubitril-valsartan TAKE (1) TABLET BY MOUTH TWICE DAILY.   furosemide 40 MG tablet Commonly known as: LASIX Take 1.5 tablets (60 mg total) by mouth daily.   Insulin Pen Needle 32G X 4 MM Misc 1 Device by Does not apply route in the morning, at noon, in the evening, and at bedtime.   isosorbide mononitrate 60 MG 24 hr tablet Commonly known as: IMDUR Take 60 mg by mouth daily.   methocarbamol 500 MG tablet Commonly known as: ROBAXIN Take 500 mg by mouth every 8 (eight) hours as needed for muscle spasms.   nitroGLYCERIN 0.4 MG SL tablet Commonly known as: NITROSTAT PLACE 1 TABLET UNDER THE TONGUE EVERY 5 MINUTES AS NEEDED FOR CHEST PAIN (UP TO 3 DOSES) What changed: See the new instructions.   NovoLOG FlexPen 100 UNIT/ML FlexPen Generic drug: insulin aspart Max daily 30 units What changed:  how much to take when to take this additional instructions   OneTouch Ultra test strip Generic drug: glucose blood TESTING 3 TIMES DAILY   polyethylene glycol 17 g packet Commonly known as: MIRALAX / GLYCOLAX Take 17 g by mouth daily.        Follow-up Information     Assunta Found, MD. Schedule an appointment as soon as possible for a visit in 1 week(s).   Specialty: Family Medicine Contact information: [address] [place] [address] [phone]                Allergies  Allergen Reactions   Penicillins Hives    Has patient had a PCN reaction causing immediate rash, facial/tongue/throat swelling, SOB or lightheadedness with hypotension: no Has patient had a PCN reaction causing severe rash involving mucus membranes or skin necrosis: No  Has patient had a PCN reaction that required hospitalization:  no Has patient had a PCN reaction occurring within the last 10 years: no If all of the above answers are "NO", then may proceed with Cephalosporin use.     Consultations: None   Procedures/Studies: DG Chest 2 View Result Date: 09/17/2023 CLINICAL DATA:  Chest pain. High blood pressure with dizziness, nausea, and headache. EXAM: CHEST - 2 VIEW COMPARISON:  12/09/2021 FINDINGS: Shallow inspiration. Cardiac enlargement with hazy perihilar and basilar infiltrates likely representing edema but could be pneumonia. No pleural effusion or pneumothorax. Mediastinal contours appear intact. Calcification of the aorta. IMPRESSION: Cardiac enlargement. Perihilar and basilar infiltrates suggesting edema or pneumonia. Electronically Signed   By: Burman Nieves M.D.   On: 09/17/2023 19:14   CT Head Wo Contrast Result Date: 09/17/2023 CLINICAL DATA:  Headache, new onset (Age >= 51y) EXAM: CT HEAD WITHOUT CONTRAST TECHNIQUE: Contiguous axial images were obtained from the base of the skull through the vertex without intravenous contrast. RADIATION DOSE REDUCTION: This exam was  performed according to the departmental dose-optimization program which includes automated exposure control, adjustment of the mA and/or kV according to patient size and/or use of iterative reconstruction technique. COMPARISON:  CT head 04/07/2022 FINDINGS: Brain: No evidence of large-territorial acute infarction. No parenchymal hemorrhage. No mass lesion. No extra-axial collection. No mass effect or midline shift. No hydrocephalus. Basilar cisterns are patent. Vascular: No hyperdense vessel. Atherosclerotic calcifications are present within the cavernous internal carotid and vertebral arteries. Skull: No acute fracture or focal lesion. Sinuses/Orbits: Paranasal sinuses and mastoid air cells are clear. Bilateral lens replacement. Otherwise the orbits are unremarkable. Other: None. IMPRESSION: No acute intracranial abnormality. Electronically  Signed   By: Tish Frederickson M.D.   On: 09/17/2023 19:12     Discharge Exam: Vitals:   09/18/23 1130 09/18/23 1200  BP: (!) 160/75   Pulse: (!) 42   Resp: 19   Temp:  98.1 F (36.7 C)  SpO2: 100%    Vitals:   09/18/23 1100 09/18/23 1115 09/18/23 1130 09/18/23 1200  BP: (!) 179/69 (!) 150/58 (!) 160/75   Pulse: (!) 46 (!) 42 (!) 42   Resp: (!) 23 19 19    Temp:    98.1 F (36.7 C)  TempSrc:    Oral  SpO2: 100% 99% 100%   Weight:      Height:        General: Pt is alert, awake, not in acute distress Cardiovascular: RRR, S1/S2 +, no rubs, no gallops Respiratory: CTA bilaterally, no wheezing, no rhonchi Abdominal: Soft, NT, ND, bowel sounds + Extremities: no edema, no cyanosis    The results of significant diagnostics from this hospitalization (including imaging, microbiology, ancillary and laboratory) are listed below for reference.     Microbiology: No results found for this or any previous visit (from the past 240 hours).   Labs: BNP (last 3 results) Recent Labs    09/17/23 1627  BNP 657.0*   Basic Metabolic Panel: Recent Labs  Lab 09/17/23 1627 09/18/23 0341  NA 140 141  K 3.8 3.2*  CL 102 104  CO2 29 27  GLUCOSE 236* 247*  BUN 25* 26*  CREATININE 1.63* 1.70*  CALCIUM 9.3 9.1   Liver Function Tests: Recent Labs  Lab 09/17/23 1627  AST 29  ALT 19  ALKPHOS 64  BILITOT 0.5  PROT 6.6  ALBUMIN 3.3*   No results for input(s): "LIPASE", "AMYLASE" in the last 168 hours. No results for input(s): "AMMONIA" in the last 168 hours. CBC: Recent Labs  Lab 09/17/23 1627 09/18/23 0341  WBC 3.1* 3.2*  NEUTROABS 1.6*  --   HGB 12.5 11.6*  HCT 37.1 33.4*  MCV 83.2 81.7  PLT 197 183   Cardiac Enzymes: No results for input(s): "CKTOTAL", "CKMB", "CKMBINDEX", "TROPONINI" in the last 168 hours. BNP: Invalid input(s): "POCBNP" CBG: Recent Labs  Lab 09/18/23 0020 09/18/23 0051 09/18/23 0146 09/18/23 0412 09/18/23 0745  GLUCAP 180* 225* 226* 249*  218*   D-Dimer No results for input(s): "DDIMER" in the last 72 hours. Hgb A1c No results for input(s): "HGBA1C" in the last 72 hours. Lipid Profile No results for input(s): "CHOL", "HDL", "LDLCALC", "TRIG", "CHOLHDL", "LDLDIRECT" in the last 72 hours. Thyroid function studies Recent Labs    09/18/23 0342  TSH 1.790   Anemia work up Recent Labs    09/18/23 0342  VITAMINB12 313  FOLATE 23.1   Urinalysis    Component Value Date/Time   COLORURINE STRAW (A) 04/11/2022 1310   APPEARANCEUR CLEAR 04/11/2022  1310   LABSPEC 1.024 04/11/2022 1310   PHURINE 5.0 04/11/2022 1310   GLUCOSEU >=500 (A) 04/11/2022 1310   HGBUR NEGATIVE 04/11/2022 1310   BILIRUBINUR NEGATIVE 04/11/2022 1310   KETONESUR 5 (A) 04/11/2022 1310   PROTEINUR NEGATIVE 04/11/2022 1310   UROBILINOGEN 0.2 09/15/2012 1010   NITRITE NEGATIVE 04/11/2022 1310   LEUKOCYTESUR NEGATIVE 04/11/2022 1310   Sepsis Labs Recent Labs  Lab 09/17/23 1627 09/18/23 0341  WBC 3.1* 3.2*   Microbiology No results found for this or any previous visit (from the past 240 hours).   Time coordinating discharge: 35 minutes  SIGNED:   Erick Blinks, DO [facility] 09/18/2023, 12:09 PM  If 7PM-7AM, please contact night-coverage [email or web address]

## 2023-09-18 NOTE — Care Management Obs Status (Signed)
 MEDICARE OBSERVATION STATUS NOTIFICATION   Patient Details  Name: Cassandra Adams MRN: [id number] Date of Birth: [birth year]   Medicare Observation Status Notification Given:  Yes    Leitha Bleak, RN 09/18/2023, 1:11 PM

## 2023-09-18 NOTE — Progress Notes (Signed)
 Patient discharging home from ED,  TOC contacted for Code 44, It was completed.    09/18/23 1313  TOC Brief Assessment  Insurance and Status Reviewed  Patient has primary care physician Yes  Home environment has been reviewed Home  Prior level of function: Independent  Prior/Current Home Services No current home services  Social Drivers of Health Review SDOH reviewed no interventions necessary  Readmission risk has been reviewed Yes  Transition of care needs no transition of care needs at this time

## 2023-09-18 NOTE — ED Notes (Signed)
 Pt appears more alert. She states she felt like her sugar was dropping. RN educated her importance of letting staff know if she feels that way.

## 2023-09-18 NOTE — ED Notes (Signed)
 Patient ambulated with walker, no c/o dizziness nor SOB.

## 2023-09-18 NOTE — ED Notes (Signed)
Nausea has resolved

## 2023-09-18 NOTE — Care Management CC44 (Signed)
 Condition Code 44 Documentation Completed  Patient Details  Name: Cassandra Adams MRN: [id number] Date of Birth: [birth year]   Condition Code 44 given:  Yes Patient signature on Condition Code 44 notice:  Yes Documentation of 2 MD's agreement:  Yes Code 44 added to claim:  Yes    Leitha Bleak, RN 09/18/2023, 1:11 PM

## 2023-09-20 LAB — METANEPHRINES, PLASMA
Metanephrine, Free: <25 pg/mL (ref 0.0–88.0)
Normetanephrine, Free: 67.5 pg/mL (ref 0.0–285.2)

## 2023-09-22 LAB — ALDOSTERONE + RENIN ACTIVITY W/ RATIO
ALDO / PRA Ratio: 6.2 (ref 0.0–30.0)
Aldosterone: 2.9 ng/dL (ref 0.0–30.0)
PRA LC/MS/MS: 0.465 ng/mL/h (ref 0.167–5.380)

## 2023-09-27 ENCOUNTER — Other Ambulatory Visit: Payer: Self-pay

## 2023-09-27 ENCOUNTER — Emergency Department (HOSPITAL_COMMUNITY)

## 2023-09-27 ENCOUNTER — Inpatient Hospital Stay (HOSPITAL_COMMUNITY)
Admission: EM | Admit: 2023-09-27 | Discharge: 2023-09-29 | DRG: 637 | Disposition: A | Discharge status: Home / Self Care | Attending: Internal Medicine | Admitting: Internal Medicine

## 2023-09-27 DIAGNOSIS — I2510 Atherosclerotic heart disease of native coronary artery without angina pectoris: Secondary | ICD-10-CM | POA: Diagnosis present

## 2023-09-27 DIAGNOSIS — Z794 Long term (current) use of insulin: Secondary | ICD-10-CM

## 2023-09-27 DIAGNOSIS — R55 Syncope and collapse: Secondary | ICD-10-CM | POA: Diagnosis present

## 2023-09-27 DIAGNOSIS — Z7901 Long term (current) use of anticoagulants: Secondary | ICD-10-CM

## 2023-09-27 DIAGNOSIS — M6282 Rhabdomyolysis: Secondary | ICD-10-CM | POA: Diagnosis present

## 2023-09-27 DIAGNOSIS — E11649 Type 2 diabetes mellitus with hypoglycemia without coma: Secondary | ICD-10-CM | POA: Diagnosis not present

## 2023-09-27 DIAGNOSIS — D573 Sickle-cell trait: Secondary | ICD-10-CM | POA: Diagnosis present

## 2023-09-27 DIAGNOSIS — R918 Other nonspecific abnormal finding of lung field: Secondary | ICD-10-CM | POA: Diagnosis present

## 2023-09-27 DIAGNOSIS — Z96651 Presence of right artificial knee joint: Secondary | ICD-10-CM | POA: Diagnosis present

## 2023-09-27 DIAGNOSIS — Z7902 Long term (current) use of antithrombotics/antiplatelets: Secondary | ICD-10-CM

## 2023-09-27 DIAGNOSIS — I130 Hypertensive heart and chronic kidney disease with heart failure and stage 1 through stage 4 chronic kidney disease, or unspecified chronic kidney disease: Secondary | ICD-10-CM | POA: Diagnosis present

## 2023-09-27 DIAGNOSIS — I4819 Other persistent atrial fibrillation: Secondary | ICD-10-CM | POA: Diagnosis present

## 2023-09-27 DIAGNOSIS — G9341 Metabolic encephalopathy: Secondary | ICD-10-CM | POA: Diagnosis present

## 2023-09-27 DIAGNOSIS — I959 Hypotension, unspecified: Secondary | ICD-10-CM | POA: Diagnosis not present

## 2023-09-27 DIAGNOSIS — I5032 Chronic diastolic (congestive) heart failure: Secondary | ICD-10-CM | POA: Diagnosis present

## 2023-09-27 DIAGNOSIS — Z9049 Acquired absence of other specified parts of digestive tract: Secondary | ICD-10-CM

## 2023-09-27 DIAGNOSIS — E162 Hypoglycemia, unspecified: Secondary | ICD-10-CM | POA: Diagnosis not present

## 2023-09-27 DIAGNOSIS — W19XXXA Unspecified fall, initial encounter: Secondary | ICD-10-CM | POA: Diagnosis present

## 2023-09-27 DIAGNOSIS — Z743 Need for continuous supervision: Secondary | ICD-10-CM | POA: Diagnosis not present

## 2023-09-27 DIAGNOSIS — Z833 Family history of diabetes mellitus: Secondary | ICD-10-CM

## 2023-09-27 DIAGNOSIS — R404 Transient alteration of awareness: Secondary | ICD-10-CM | POA: Diagnosis not present

## 2023-09-27 DIAGNOSIS — D6869 Other thrombophilia: Secondary | ICD-10-CM | POA: Diagnosis present

## 2023-09-27 DIAGNOSIS — E1165 Type 2 diabetes mellitus with hyperglycemia: Secondary | ICD-10-CM

## 2023-09-27 DIAGNOSIS — R680 Hypothermia, not associated with low environmental temperature: Secondary | ICD-10-CM | POA: Diagnosis present

## 2023-09-27 DIAGNOSIS — E1122 Type 2 diabetes mellitus with diabetic chronic kidney disease: Secondary | ICD-10-CM | POA: Diagnosis present

## 2023-09-27 DIAGNOSIS — Z1152 Encounter for screening for COVID-19: Secondary | ICD-10-CM

## 2023-09-27 DIAGNOSIS — N1832 Chronic kidney disease, stage 3b: Secondary | ICD-10-CM | POA: Diagnosis present

## 2023-09-27 DIAGNOSIS — Z8249 Family history of ischemic heart disease and other diseases of the circulatory system: Secondary | ICD-10-CM

## 2023-09-27 DIAGNOSIS — E669 Obesity, unspecified: Secondary | ICD-10-CM | POA: Diagnosis present

## 2023-09-27 DIAGNOSIS — E782 Mixed hyperlipidemia: Secondary | ICD-10-CM | POA: Diagnosis present

## 2023-09-27 DIAGNOSIS — Z880 Allergy status to penicillin: Secondary | ICD-10-CM

## 2023-09-27 DIAGNOSIS — Y92009 Unspecified place in unspecified non-institutional (private) residence as the place of occurrence of the external cause: Secondary | ICD-10-CM

## 2023-09-27 DIAGNOSIS — Z6826 Body mass index (BMI) 26.0-26.9, adult: Secondary | ICD-10-CM

## 2023-09-27 DIAGNOSIS — I272 Pulmonary hypertension, unspecified: Secondary | ICD-10-CM | POA: Diagnosis present

## 2023-09-27 DIAGNOSIS — Z8673 Personal history of transient ischemic attack (TIA), and cerebral infarction without residual deficits: Secondary | ICD-10-CM

## 2023-09-27 DIAGNOSIS — E161 Other hypoglycemia: Secondary | ICD-10-CM | POA: Diagnosis not present

## 2023-09-27 DIAGNOSIS — Z955 Presence of coronary angioplasty implant and graft: Secondary | ICD-10-CM

## 2023-09-27 DIAGNOSIS — I252 Old myocardial infarction: Secondary | ICD-10-CM

## 2023-09-27 DIAGNOSIS — Z79899 Other long term (current) drug therapy: Secondary | ICD-10-CM

## 2023-09-27 DIAGNOSIS — Z7984 Long term (current) use of oral hypoglycemic drugs: Secondary | ICD-10-CM

## 2023-09-27 DIAGNOSIS — I480 Paroxysmal atrial fibrillation: Secondary | ICD-10-CM | POA: Diagnosis present

## 2023-09-27 DIAGNOSIS — I482 Chronic atrial fibrillation, unspecified: Secondary | ICD-10-CM | POA: Diagnosis present

## 2023-09-27 LAB — CBC WITH DIFFERENTIAL/PLATELET
Abs Immature Granulocytes: 0.01 10*3/uL (ref 0.00–0.07)
Basophils Absolute: 0 10*3/uL (ref 0.0–0.1)
Basophils Relative: 1 %
Eosinophils Absolute: 0.1 10*3/uL (ref 0.0–0.5)
Eosinophils Relative: 4 %
HCT: 43.9 % (ref 36.0–46.0)
Hemoglobin: 15.2 g/dL — ABNORMAL HIGH (ref 12.0–15.0)
Immature Granulocytes: 0 %
Lymphocytes Relative: 34 %
Lymphs Abs: 1.2 10*3/uL (ref 0.7–4.0)
MCH: 28 pg (ref 26.0–34.0)
MCHC: 34.6 g/dL (ref 30.0–36.0)
MCV: 80.8 fL (ref 80.0–100.0)
Monocytes Absolute: 0.3 10*3/uL (ref 0.1–1.0)
Monocytes Relative: 9 %
Neutro Abs: 1.8 10*3/uL (ref 1.7–7.7)
Neutrophils Relative %: 52 %
Platelets: 168 10*3/uL (ref 150–400)
RBC: 5.43 MIL/uL — ABNORMAL HIGH (ref 3.87–5.11)
RDW: 15 % (ref 11.5–15.5)
WBC: 3.4 10*3/uL — ABNORMAL LOW (ref 4.0–10.5)
nRBC: 0 % (ref 0.0–0.2)

## 2023-09-27 LAB — I-STAT CHEM 8, ED
BUN: 32 mg/dL — ABNORMAL HIGH (ref 8–23)
Calcium, Ion: 1.33 mmol/L (ref 1.15–1.40)
Chloride: 102 mmol/L (ref 98–111)
Creatinine, Ser: 2 mg/dL — ABNORMAL HIGH (ref 0.44–1.00)
Glucose, Bld: 163 mg/dL — ABNORMAL HIGH (ref 70–99)
HCT: 43 % (ref 36.0–46.0)
Hemoglobin: 14.6 g/dL (ref 12.0–15.0)
Potassium: 3.8 mmol/L (ref 3.5–5.1)
Sodium: 141 mmol/L (ref 135–145)
TCO2: 32 mmol/L (ref 22–32)

## 2023-09-27 LAB — CBG MONITORING, ED
Glucose-Capillary: 59 mg/dL — ABNORMAL LOW (ref 70–99)
Glucose-Capillary: 173 mg/dL — ABNORMAL HIGH (ref 70–99)

## 2023-09-27 MED ORDER — DEXTROSE 50 % IV SOLN: 1.0000 | Freq: Once | INTRAVENOUS | Status: DC

## 2023-09-27 MED ORDER — DEXTROSE 50 % IV SOLN
1.0000 | Freq: Once | INTRAVENOUS | Status: AC
  Administered 2023-09-27: 50 mL via INTRAVENOUS
  Filled 2023-09-27: qty 50

## 2023-09-27 MED ORDER — SODIUM CHLORIDE 0.9 % IV BOLUS
1000.0000 mL | Freq: Once | INTRAVENOUS | Status: AC
  Administered 2023-09-27: 1000 mL via INTRAVENOUS

## 2023-09-27 NOTE — ED Triage Notes (Signed)
 Pt arrived via [facility] cc of hypoglycemia found on the floor of the kitchen with neck turned to side unresponsive and sweating. EMS placed the c collar due to neck and found down on floor. EMS endorses Blood glucose was 44 and pt was given 1 mg of glucagon. EMS took second blood glucose at 67.

## 2023-09-27 NOTE — ED Notes (Signed)
 This RN noted the pts rectal temp was 92.69F and the pt was place on the Bare hugger and MD Scheving made aware.

## 2023-09-28 ENCOUNTER — Emergency Department (HOSPITAL_COMMUNITY)

## 2023-09-28 ENCOUNTER — Other Ambulatory Visit (HOSPITAL_COMMUNITY)

## 2023-09-28 ENCOUNTER — Other Ambulatory Visit (HOSPITAL_COMMUNITY): Payer: Self-pay | Admitting: *Deleted

## 2023-09-28 ENCOUNTER — Inpatient Hospital Stay (HOSPITAL_COMMUNITY)
Admit: 2023-09-28 | Discharge: 2023-09-28 | Disposition: A | Discharge status: Home / Self Care | Attending: Internal Medicine | Admitting: Internal Medicine

## 2023-09-28 ENCOUNTER — Other Ambulatory Visit: Payer: Self-pay

## 2023-09-28 DIAGNOSIS — M25532 Pain in left wrist: Secondary | ICD-10-CM | POA: Diagnosis not present

## 2023-09-28 DIAGNOSIS — I5032 Chronic diastolic (congestive) heart failure: Secondary | ICD-10-CM | POA: Diagnosis not present

## 2023-09-28 DIAGNOSIS — I2510 Atherosclerotic heart disease of native coronary artery without angina pectoris: Secondary | ICD-10-CM | POA: Diagnosis not present

## 2023-09-28 DIAGNOSIS — R680 Hypothermia, not associated with low environmental temperature: Secondary | ICD-10-CM | POA: Diagnosis not present

## 2023-09-28 DIAGNOSIS — D6869 Other thrombophilia: Secondary | ICD-10-CM | POA: Diagnosis not present

## 2023-09-28 DIAGNOSIS — R059 Cough, unspecified: Secondary | ICD-10-CM | POA: Diagnosis not present

## 2023-09-28 DIAGNOSIS — G319 Degenerative disease of nervous system, unspecified: Secondary | ICD-10-CM | POA: Diagnosis not present

## 2023-09-28 DIAGNOSIS — R569 Unspecified convulsions: Secondary | ICD-10-CM | POA: Diagnosis not present

## 2023-09-28 DIAGNOSIS — R519 Headache, unspecified: Secondary | ICD-10-CM | POA: Diagnosis not present

## 2023-09-28 DIAGNOSIS — I480 Paroxysmal atrial fibrillation: Secondary | ICD-10-CM | POA: Diagnosis not present

## 2023-09-28 DIAGNOSIS — G9341 Metabolic encephalopathy: Secondary | ICD-10-CM | POA: Diagnosis not present

## 2023-09-28 DIAGNOSIS — W19XXXA Unspecified fall, initial encounter: Secondary | ICD-10-CM | POA: Diagnosis not present

## 2023-09-28 DIAGNOSIS — I130 Hypertensive heart and chronic kidney disease with heart failure and stage 1 through stage 4 chronic kidney disease, or unspecified chronic kidney disease: Secondary | ICD-10-CM | POA: Diagnosis not present

## 2023-09-28 DIAGNOSIS — Z794 Long term (current) use of insulin: Secondary | ICD-10-CM

## 2023-09-28 DIAGNOSIS — S299XXA Unspecified injury of thorax, initial encounter: Secondary | ICD-10-CM | POA: Diagnosis not present

## 2023-09-28 DIAGNOSIS — M6282 Rhabdomyolysis: Secondary | ICD-10-CM | POA: Diagnosis not present

## 2023-09-28 DIAGNOSIS — Z96651 Presence of right artificial knee joint: Secondary | ICD-10-CM | POA: Diagnosis not present

## 2023-09-28 DIAGNOSIS — R918 Other nonspecific abnormal finding of lung field: Secondary | ICD-10-CM | POA: Diagnosis not present

## 2023-09-28 DIAGNOSIS — E162 Hypoglycemia, unspecified: Secondary | ICD-10-CM | POA: Diagnosis not present

## 2023-09-28 DIAGNOSIS — N1832 Chronic kidney disease, stage 3b: Secondary | ICD-10-CM | POA: Diagnosis not present

## 2023-09-28 DIAGNOSIS — E1165 Type 2 diabetes mellitus with hyperglycemia: Secondary | ICD-10-CM | POA: Diagnosis not present

## 2023-09-28 DIAGNOSIS — M25512 Pain in left shoulder: Secondary | ICD-10-CM | POA: Diagnosis not present

## 2023-09-28 DIAGNOSIS — D259 Leiomyoma of uterus, unspecified: Secondary | ICD-10-CM | POA: Diagnosis not present

## 2023-09-28 DIAGNOSIS — I252 Old myocardial infarction: Secondary | ICD-10-CM | POA: Diagnosis not present

## 2023-09-28 DIAGNOSIS — E1122 Type 2 diabetes mellitus with diabetic chronic kidney disease: Secondary | ICD-10-CM | POA: Diagnosis not present

## 2023-09-28 DIAGNOSIS — I482 Chronic atrial fibrillation, unspecified: Secondary | ICD-10-CM | POA: Diagnosis not present

## 2023-09-28 DIAGNOSIS — Y92009 Unspecified place in unspecified non-institutional (private) residence as the place of occurrence of the external cause: Secondary | ICD-10-CM | POA: Diagnosis not present

## 2023-09-28 DIAGNOSIS — E11649 Type 2 diabetes mellitus with hypoglycemia without coma: Secondary | ICD-10-CM | POA: Diagnosis not present

## 2023-09-28 DIAGNOSIS — Z7901 Long term (current) use of anticoagulants: Secondary | ICD-10-CM | POA: Diagnosis not present

## 2023-09-28 DIAGNOSIS — R4182 Altered mental status, unspecified: Secondary | ICD-10-CM

## 2023-09-28 DIAGNOSIS — I6529 Occlusion and stenosis of unspecified carotid artery: Secondary | ICD-10-CM | POA: Diagnosis not present

## 2023-09-28 DIAGNOSIS — E669 Obesity, unspecified: Secondary | ICD-10-CM | POA: Diagnosis not present

## 2023-09-28 DIAGNOSIS — I4819 Other persistent atrial fibrillation: Secondary | ICD-10-CM

## 2023-09-28 DIAGNOSIS — E782 Mixed hyperlipidemia: Secondary | ICD-10-CM | POA: Diagnosis not present

## 2023-09-28 DIAGNOSIS — M1812 Unilateral primary osteoarthritis of first carpometacarpal joint, left hand: Secondary | ICD-10-CM | POA: Diagnosis not present

## 2023-09-28 DIAGNOSIS — I272 Pulmonary hypertension, unspecified: Secondary | ICD-10-CM | POA: Diagnosis not present

## 2023-09-28 DIAGNOSIS — M25522 Pain in left elbow: Secondary | ICD-10-CM | POA: Diagnosis not present

## 2023-09-28 DIAGNOSIS — Z7902 Long term (current) use of antithrombotics/antiplatelets: Secondary | ICD-10-CM | POA: Diagnosis not present

## 2023-09-28 DIAGNOSIS — D573 Sickle-cell trait: Secondary | ICD-10-CM | POA: Diagnosis not present

## 2023-09-28 DIAGNOSIS — Z7984 Long term (current) use of oral hypoglycemic drugs: Secondary | ICD-10-CM | POA: Diagnosis not present

## 2023-09-28 DIAGNOSIS — S3991XA Unspecified injury of abdomen, initial encounter: Secondary | ICD-10-CM | POA: Diagnosis not present

## 2023-09-28 DIAGNOSIS — Z1152 Encounter for screening for COVID-19: Secondary | ICD-10-CM | POA: Diagnosis not present

## 2023-09-28 DIAGNOSIS — S3993XA Unspecified injury of pelvis, initial encounter: Secondary | ICD-10-CM | POA: Diagnosis not present

## 2023-09-28 DIAGNOSIS — Z8249 Family history of ischemic heart disease and other diseases of the circulatory system: Secondary | ICD-10-CM | POA: Diagnosis not present

## 2023-09-28 LAB — RESPIRATORY PANEL BY PCR

## 2023-09-28 LAB — COMPREHENSIVE METABOLIC PANEL WITH GFR
ALT: 22 U/L (ref 0–44)
AST: 38 U/L (ref 15–41)
Albumin: 3.7 g/dL (ref 3.5–5.0)
Alkaline Phosphatase: 79 U/L (ref 38–126)
Anion gap: 11 (ref 5–15)
BUN: 31 mg/dL — ABNORMAL HIGH (ref 8–23)
CO2: 26 mmol/L (ref 22–32)
Calcium: 9.6 mg/dL (ref 8.9–10.3)
Chloride: 101 mmol/L (ref 98–111)
Creatinine, Ser: 1.99 mg/dL — ABNORMAL HIGH (ref 0.44–1.00)
GFR, Estimated: 25 mL/min — ABNORMAL LOW (ref >60)
Glucose, Bld: 382 mg/dL — ABNORMAL HIGH (ref 70–99)
Potassium: 3.9 mmol/L (ref 3.5–5.1)
Sodium: 138 mmol/L (ref 135–145)
Total Bilirubin: 0.6 mg/dL (ref 0.0–1.2)
Total Protein: 7.3 g/dL (ref 6.5–8.1)

## 2023-09-28 LAB — URINALYSIS, W/ REFLEX TO CULTURE (INFECTION SUSPECTED)
Bacteria, UA: NONE SEEN
Bilirubin Urine: NEGATIVE
Glucose, UA: >=500 mg/dL — AB
Ketones, ur: NEGATIVE mg/dL
Leukocytes,Ua: NEGATIVE
Nitrite: NEGATIVE
Protein, ur: 100 mg/dL — AB
Specific Gravity, Urine: 1.006 (ref 1.005–1.030)
pH: 7 (ref 5.0–8.0)

## 2023-09-28 LAB — MRSA NEXT GEN BY PCR, NASAL: MRSA by PCR Next Gen: NOT DETECTED

## 2023-09-28 LAB — CORTISOL-AM, BLOOD: Cortisol - AM: 13.1 ug/dL (ref 6.7–22.6)

## 2023-09-28 LAB — GLUCOSE, CAPILLARY
Glucose-Capillary: 237 mg/dL — ABNORMAL HIGH (ref 70–99)
Glucose-Capillary: 271 mg/dL — ABNORMAL HIGH (ref 70–99)

## 2023-09-28 LAB — CK: Total CK: 401 U/L — ABNORMAL HIGH (ref 38–234)

## 2023-09-28 LAB — CBG MONITORING, ED: Glucose-Capillary: 214 mg/dL — ABNORMAL HIGH (ref 70–99)

## 2023-09-28 LAB — LACTIC ACID, PLASMA
Lactic Acid, Venous: 0.9 mmol/L (ref 0.5–1.9)
Lactic Acid, Venous: 1.8 mmol/L (ref 0.5–1.9)

## 2023-09-28 LAB — HEMOGLOBIN A1C
Hgb A1c MFr Bld: 7.8 % — ABNORMAL HIGH (ref 4.8–5.6)
Mean Plasma Glucose: 177.16 mg/dL

## 2023-09-28 LAB — PROCALCITONIN: Procalcitonin: 1.86 ng/mL

## 2023-09-28 LAB — TSH: TSH: 0.584 u[IU]/mL (ref 0.350–4.500)

## 2023-09-28 LAB — RESP PANEL BY RT-PCR (RSV, FLU A&B, COVID)  RVPGX2
Influenza A by PCR: NEGATIVE
Influenza B by PCR: NEGATIVE
Resp Syncytial Virus by PCR: NEGATIVE
SARS Coronavirus 2 by RT PCR: NEGATIVE

## 2023-09-28 LAB — AMMONIA: Ammonia: 21 umol/L (ref 9–35)

## 2023-09-28 LAB — FOLATE: Folate: 21.3 ng/mL (ref >5.9)

## 2023-09-28 LAB — VITAMIN B12: Vitamin B-12: 428 pg/mL (ref 180–914)

## 2023-09-28 LAB — T4, FREE: Free T4: 1.19 ng/dL — ABNORMAL HIGH (ref 0.61–1.12)

## 2023-09-28 MED ORDER — ATORVASTATIN CALCIUM 40 MG PO TABS
80.0000 mg | ORAL_TABLET | Freq: Every evening | ORAL | Status: DC
  Administered 2023-09-28: 80 mg via ORAL
  Filled 2023-09-28: qty 2

## 2023-09-28 MED ORDER — INSULIN ASPART 100 UNIT/ML IJ SOLN
0.0000 [IU] | Freq: Every day | INTRAMUSCULAR | Status: DC
  Administered 2023-09-28: 3 [IU] via SUBCUTANEOUS

## 2023-09-28 MED ORDER — EMPAGLIFLOZIN 10 MG PO TABS
10.0000 mg | ORAL_TABLET | Freq: Every day | ORAL | Status: DC
  Administered 2023-09-28 – 2023-09-29 (×2): 10 mg via ORAL
  Filled 2023-09-28 (×2): qty 1

## 2023-09-28 MED ORDER — AMLODIPINE BESYLATE 5 MG PO TABS
10.0000 mg | ORAL_TABLET | Freq: Every day | ORAL | Status: DC
  Administered 2023-09-28 – 2023-09-29 (×2): 10 mg via ORAL
  Filled 2023-09-28 (×2): qty 2

## 2023-09-28 MED ORDER — LACTATED RINGERS IV SOLN: INTRAVENOUS | Status: AC

## 2023-09-28 MED ORDER — ISOSORBIDE MONONITRATE ER 60 MG PO TB24
60.0000 mg | ORAL_TABLET | Freq: Every day | ORAL | Status: DC
  Administered 2023-09-28 – 2023-09-29 (×2): 60 mg via ORAL
  Filled 2023-09-28 (×2): qty 1

## 2023-09-28 MED ORDER — SODIUM CHLORIDE 0.9 % IV SOLN
1.0000 g | INTRAVENOUS | Status: DC
  Administered 2023-09-28 – 2023-09-29 (×2): 1 g via INTRAVENOUS
  Filled 2023-09-28 (×2): qty 10

## 2023-09-28 MED ORDER — ONDANSETRON HCL 4 MG PO TABS: 4.0000 mg | ORAL_TABLET | Freq: Four times a day (QID) | ORAL | Status: DC | PRN

## 2023-09-28 MED ORDER — POLYETHYLENE GLYCOL 3350 17 G PO PACK
17.0000 g | PACK | Freq: Every day | ORAL | Status: DC
  Administered 2023-09-28: 17 g via ORAL
  Filled 2023-09-28 (×2): qty 1

## 2023-09-28 MED ORDER — ACETAMINOPHEN 325 MG PO TABS
650.0000 mg | ORAL_TABLET | Freq: Four times a day (QID) | ORAL | Status: DC | PRN
  Administered 2023-09-28 – 2023-09-29 (×2): 650 mg via ORAL
  Filled 2023-09-28 (×2): qty 2

## 2023-09-28 MED ORDER — ACETAMINOPHEN 650 MG RE SUPP: 650.0000 mg | Freq: Four times a day (QID) | RECTAL | Status: DC | PRN

## 2023-09-28 MED ORDER — ONDANSETRON HCL 4 MG/2ML IJ SOLN
4.0000 mg | Freq: Four times a day (QID) | INTRAMUSCULAR | Status: DC | PRN
  Administered 2023-09-28: 4 mg via INTRAVENOUS
  Filled 2023-09-28: qty 2

## 2023-09-28 MED ORDER — INSULIN ASPART 100 UNIT/ML IJ SOLN
0.0000 [IU] | Freq: Three times a day (TID) | INTRAMUSCULAR | Status: DC
  Administered 2023-09-28: 3 [IU] via SUBCUTANEOUS
  Administered 2023-09-28: 5 [IU] via SUBCUTANEOUS
  Administered 2023-09-29: 1 [IU] via SUBCUTANEOUS

## 2023-09-28 MED ORDER — MORPHINE SULFATE (PF) 2 MG/ML IV SOLN
2.0000 mg | Freq: Once | INTRAVENOUS | Status: AC
  Administered 2023-09-28: 2 mg via INTRAVENOUS
  Filled 2023-09-28: qty 1

## 2023-09-28 MED ORDER — SACUBITRIL-VALSARTAN 49-51 MG PO TABS
1.0000 | ORAL_TABLET | Freq: Two times a day (BID) | ORAL | Status: DC
  Administered 2023-09-28 – 2023-09-29 (×3): 1 via ORAL
  Filled 2023-09-28 (×5): qty 1

## 2023-09-28 MED ORDER — ONDANSETRON HCL 4 MG/2ML IJ SOLN
4.0000 mg | Freq: Once | INTRAMUSCULAR | Status: AC
  Administered 2023-09-28: 4 mg via INTRAVENOUS
  Filled 2023-09-28: qty 2

## 2023-09-28 MED ORDER — CARVEDILOL 3.125 MG PO TABS
3.1250 mg | ORAL_TABLET | Freq: Two times a day (BID) | ORAL | Status: DC
  Administered 2023-09-29: 3.125 mg via ORAL
  Filled 2023-09-28 (×2): qty 1

## 2023-09-28 MED ORDER — CLOPIDOGREL BISULFATE 75 MG PO TABS
75.0000 mg | ORAL_TABLET | Freq: Every day | ORAL | Status: DC
  Administered 2023-09-28 – 2023-09-29 (×2): 75 mg via ORAL
  Filled 2023-09-28 (×2): qty 1

## 2023-09-28 MED ORDER — NITROGLYCERIN 0.4 MG SL SUBL: 0.4000 mg | SUBLINGUAL_TABLET | SUBLINGUAL | Status: DC | PRN

## 2023-09-28 MED ORDER — APIXABAN 5 MG PO TABS
5.0000 mg | ORAL_TABLET | Freq: Two times a day (BID) | ORAL | Status: DC
  Administered 2023-09-28 – 2023-09-29 (×3): 5 mg via ORAL
  Filled 2023-09-28 (×3): qty 1

## 2023-09-28 MED ORDER — CHLORHEXIDINE GLUCONATE CLOTH 2 % EX PADS: 6.0000 | MEDICATED_PAD | Freq: Every day | CUTANEOUS | Status: DC

## 2023-09-28 NOTE — Progress Notes (Signed)
 EEG complete - results pending

## 2023-09-28 NOTE — ED Notes (Signed)
 Pt was able to answer all Alert and orientated questions for this RN. This is an improvement from the pt only knowing her name upon arrival to ED.

## 2023-09-28 NOTE — Inpatient Diabetes Management (Signed)
 Inpatient Diabetes Program Recommendations  AACE/ADA: New Consensus Statement on Inpatient Glycemic Control (2015)  Target Ranges:  Prepandial:   less than 140 mg/dL      Peak postprandial:   less than 180 mg/dL (1-2 hours)      Critically ill patients:  140 - 180 mg/dL   Lab Results  Component Value Date   GLUCAP 214 (H) 09/28/2023   HGBA1C 9.1 (A) 07/16/2023    Review of Glycemic Control  Latest Reference Range & Units 09/27/23 22:12 09/27/23 23:23 09/28/23 01:06  Glucose-Capillary 70 - 99 mg/dL 59 (L) 409 (H) 811 (H)  (L): Data is abnormally low (H): Data is abnormally high Diabetes history: Type 2 DM Outpatient Diabetes medications: Basaglar 28 units every day, Novolog 6 units TID, Jardiance 10 mg QD Current orders for Inpatient glycemic control: Jardiance 10 mg QD  Inpatient Diabetes Program Recommendations:    Consider adding Novolog 0-9 units TID.   Thanks, Lujean Rave, MSN, RNC-OB Diabetes Coordinator [phone] (8a-5p)

## 2023-09-28 NOTE — ED Notes (Signed)
 This RN noted that the pts oral temp is 98.4 F and the Bare hugger was turned off.

## 2023-09-28 NOTE — Evaluation (Addendum)
 Physical Therapy Evaluation Patient Details Name: Cassandra Adams MRN: [id number] DOB: Dec 20, 1945 Today's Date: 09/28/2023  History of Present Illness  Cassandra Adams is a 78 year old female with a history of CAD/NSTEMI (Feb 2023), paroxysmal atrial fibrillation, CKD stage III, diabetes mellitus type 2, hypertension, hyperlipidemia, and sick sinus syndrome presenting with altered mental status.  At baseline, the patient lives by herself and ambulates with a cane and a walker.  She is independent with her ADLs.  Apparently, the patient's grandson found the patient confused and diaphoretic on the ground around 9 PM on 09/27/2023.  The last thing the patient remembered was seeing her grandson around 5 PM on 09/27/2023.  The patient had been in her usual state of health.  She denies any new medications.  Patient denies fevers, chills, headache, chest pain, dyspnea, nausea, vomiting, diarrhea, abdominal pain, dysuria, hematuria, hematochezia, and melena.  Patient states that she takes her own medications, but her daughter does live out her medications.  She denies any recent changes in her medications.  She was recently admitted to the hospital from 09/17/2023 to 09/18/2023 secondary to hypertensive urgency.  The patient was started on nitroglycerin drip at that time.  Her antihypertensive medications were re-introduced with improvement of her BP.  Her amlodipine was increased from 5 to 10 mg   Clinical Impression  Patient tolerated session well. On this date, patient was able to perform bed mobility (supine>sit) and functional transfer (sit to stand and bed>chair) modified independently with RW, as well as progress from CGA to mod(I) during short ambulation trial in room. Patient reports independence with ADLs and intermittent assist for iADLs. Patient does demonstrate some general weakness in UE/LE, decreased ROM in both UE/LE (more notably in UE), as mild impaired balance/coordination 2/2 dependence on AD.  Patient reports feeling at/near her baseline with mobility. Patient does not present with urgent need for skilled physical therapy at this time but may benefit from PT in home health setting to address the above deficits in order to improve overall function. Patient discharged to care of nursing for ambulation daily as tolerated for length of stay.       If plan is discharge home, recommend the following: A little help with walking and/or transfers;A little help with bathing/dressing/bathroom   Can travel by private vehicle        Equipment Recommendations None recommended by PT  Recommendations for Other Services       Functional Status Assessment Patient has had a recent decline in their functional status and demonstrates the ability to make significant improvements in function in a reasonable and predictable amount of time.     Precautions / Restrictions Precautions Precautions: Fall Restrictions Weight Bearing Restrictions Per Provider Order: No      Mobility  Bed Mobility Overal bed mobility: Modified Independent     General bed mobility comments: HOB slightly elevated. No use of railings. Inc time needed 2/2 weakness. Reports just feeling weak once EOB. No pain or dizziness.    Transfers Overall transfer level: Modified independent Equipment used: Rolling walker (2 wheels) Transfers: Sit to/from Stand, Bed to chair/wheelchair/BSC Sit to Stand: Modified independent (Device/Increase time)   Step pivot transfers: Modified independent (Device/Increase time)       General transfer comment: w/ RW and 1:1 hand placement. Reports of LE feeling weak    Ambulation/Gait Ambulation/Gait assistance: Modified independent (Device/Increase time), Contact guard assist Gait Distance (Feet): 5 Feet Assistive device: Rolling walker (2 wheels) Gait Pattern/deviations: Decreased step length -  left, Decreased stance time - right, Decreased stride length, Step-through pattern, Trunk  flexed Gait velocity: Decreased     General Gait Details: CGA for safety initially. Progress to Mod(I). Pt steady t/o. No overt LOB. Very slow/labored movement  Stairs      Wheelchair Mobility     Tilt Bed    Modified Rankin (Stroke Patients Only)       Balance Overall balance assessment: Mild deficits observed, not formally tested (Good sitting balance w/ both feet supported and varying UE support EOB. Use of RW needed for standing.)           Pertinent Vitals/Pain Pain Assessment Pain Assessment: No/denies pain (Legs just feel weak)    Home Living Family/patient expects to be discharged to:: Private residence Living Arrangements: Alone Available Help at Discharge: Available PRN/intermittently;Friend(s);Family (Reports she has friends and family close by that could assist if needed) Type of Home: House Home Access: Level entry       Home Layout: One level Home Equipment: Agricultural consultant (2 wheels);Cane - single point;Shower seat;Toilet riser;Grab bars - tub/shower Additional Comments: Reports she's never drove. Family will bring her groceries. Or grandson brings her food if he cooks    Prior Function Prior Level of Function : Independent/Modified Independent     Mobility Comments: House hold and short distance community ambulator. Reports having SPC and RW. RW used most. ADLs Comments: Reports (I) w/ ADLs and can perform iADLs but daughter and grandson sometimes assist.     Extremity/Trunk Assessment   Upper Extremity Assessment Upper Extremity Assessment: RUE deficits/detail;LUE deficits/detail;Generalized weakness RUE Deficits / Details: Pt with previous shoulder issues. Unable to reach overhead bilaterally. Can hold at shoulder height, 3+/ shoulder flexion MMT RUE Coordination: decreased gross motor LUE Deficits / Details: Pt with previous shoulder issues. Unable to reach overhead bilaterally. Can hold at shoulder height, 3+/ shoulder flexion MMT LUE  Coordination: decreased gross motor    Lower Extremity Assessment Lower Extremity Assessment: RLE deficits/detail;LLE deficits/detail;Generalized weakness RLE Deficits / Details: Pt w/ dec hip flexor strength bilaterally. 3+ on R, 4- on L. Reports pain in R knee from previous TKR. DF 5/5 bilat. RLE Coordination: decreased gross motor LLE Deficits / Details: Pt w/ dec hip flexor strength bilaterally. 3+ on R, 4- on L. Reports pain in R knee from previous TKR. DF 5/5 bilat. LLE Coordination: decreased gross motor    Cervical / Trunk Assessment Cervical / Trunk Assessment: Kyphotic  Communication   Communication Communication: No apparent difficulties    Cognition Arousal: Alert Behavior During Therapy: WFL for tasks assessed/performed   PT - Cognitive impairments: No apparent impairments       PT - Cognition Comments: Pt A&O x4 Following commands: Intact       Cueing Cueing Techniques: Verbal cues     General Comments      Exercises     Assessment/Plan    PT Assessment All further PT needs can be met in the next venue of care  PT Problem List Decreased strength;Decreased range of motion;Decreased activity tolerance;Decreased balance;Decreased mobility       PT Treatment Interventions      PT Goals (Current goals can be found in the Care Plan section)  Acute Rehab PT Goals Patient Stated Goal: Return home PT Goal Formulation: With patient Time For Goal Achievement: 10/05/23 Potential to Achieve Goals: Good    Frequency       Co-evaluation  AM-PAC PT "6 Clicks" Mobility  Outcome Measure Help needed turning from your back to your side while in a flat bed without using bedrails?: None Help needed moving from lying on your back to sitting on the side of a flat bed without using bedrails?: None Help needed moving to and from a bed to a chair (including a wheelchair)?: None Help needed standing up from a chair using your arms (e.g.,  wheelchair or bedside chair)?: None Help needed to walk in hospital room?: A Little Help needed climbing 3-5 steps with a railing? : A Lot 6 Click Score: 21    End of Session   Activity Tolerance: Patient tolerated treatment well Patient left: in chair;with nursing/sitter in room (Pt left in recliner with next staff member to perform their assessment) Nurse Communication: Mobility status PT Visit Diagnosis: Unsteadiness on feet (R26.81);Difficulty in walking, not elsewhere classified (R26.2);Other abnormalities of gait and mobility (R26.89);Muscle weakness (generalized) (M62.81);History of falling (Z91.81)    Time: 1610-9604 PT Time Calculation (min) (ACUTE ONLY): 15 min   Charges:   PT Evaluation $PT Eval Low Complexity: 1 Low PT Treatments $Therapeutic Activity: 8-22 mins PT General Charges $$ ACUTE PT VISIT: 1 Visit         4:22 PM, 09/28/23 Girtha Kilgore Powell-Butler, PT, DPT [place] with [facility]

## 2023-09-28 NOTE — ED Notes (Signed)
 Pt is in CT

## 2023-09-28 NOTE — Hospital Course (Signed)
 78 year old female with a history of CAD/NSTEMI (Feb 2023), paroxysmal atrial fibrillation, CKD stage III, diabetes mellitus type 2, hypertension, hyperlipidemia, and sick sinus syndrome presenting with altered mental status.  At baseline, the patient lives by herself and ambulates with a cane and a walker.  She is independent with her ADLs.  Apparently, the patient's grandson found the patient confused and diaphoretic on the ground around 9 PM on 09/27/2023.  The last thing the patient remembered was seeing her grandson around 5 PM on 09/27/2023. The patient had been in her usual state of health.  She denies any new medications.  Patient denies fevers, chills, headache, chest pain, dyspnea, nausea, vomiting, diarrhea, abdominal pain, dysuria, hematuria, hematochezia, and melena. Patient states that she takes her own medications, but her daughter does live out her medications.  She denies any recent changes in her medications. She was recently admitted to the hospital from 09/17/2023 to 09/18/2023 secondary to hypertensive urgency.  The patient was started on nitroglycerin drip at that time.  Her antihypertensive medications were re-introduced with improvement of her BP.  Her amlodipine was increased from 5 to 10 mg.  The patient denies any focal extremity weakness, headache, neck pain, dysesthesias. Her grandson activated EMS.  Apparently the patient was noted to have glucose of 44.  Glucagon was given with improvement to 67.  In the ED, the patient was afebrile and hemodynamically stable with oxygen saturation 96% room air.  Her CBG initially was 59 in the ED.  She was noted to be hypothermic with a temperature of 92.7 F.  Given the constellation of her symptoms and altered mental status, she was admitted for further evaluation and treatment. WBC 3.4, hemoglobin 15.2, plates 469.  Sodium 138, potassium 3.9, bicarbonate 26, serum creatinine 1.99.  AST 38, ALT 22, alk phosphatase 79, total bilirubin 0.6, corrected  calcium 9.9.  EKG showed sinus rhythm with nonspecific T wave changes.  Lactic acid 1.8>> 0.9.  CPK 401.  Urine drug screen negative.  Urinalysis negative for pyuria.  COVID-19 PCR is negative.  CT brain and CT cervical spine were negative for any acute findings.  X-rays of her left elbow, left wrist, and left shoulder were negative for any dislocation or fracture. CT of the chest, abdomen, and pelvis showed mild tree-in-bud nodularity in the right upper lobe.  There is remote anterior wedge deformity of T12 with 50% loss in height.  There were changes consistent with pulmonary hypertension.

## 2023-09-28 NOTE — Procedures (Signed)
 Patient Name: Cassandra Adams  MRN: [id number]  Epilepsy Attending: Charlsie Quest  Referring Physician/Provider: Catarina Hartshorn, MD  Date: 4/42025 Duration: 22.30 mins  Patient history: 78yo F with ams. EEG to evaluate for seizure  Level of alertness: Awake  AEDs during EEG study: None  Technical aspects: This EEG study was done with scalp electrodes positioned according to the 10-20 International system of electrode placement. Electrical activity was reviewed with band pass filter of 1-70Hz , sensitivity of 7 uV/mm, display speed of 89mm/sec with a 60Hz  notched filter applied as appropriate. EEG data were recorded continuously and digitally stored.  Video monitoring was available and reviewed as appropriate.  Description: The posterior dominant rhythm consists of 8-9 Hz activity of moderate voltage (25-35 uV) seen predominantly in posterior head regions, symmetric and reactive to eye opening and eye closing. There is 13 to 15 Hz beta activity distributed symmetrically and diffusely. Hyperventilation and photic stimulation were not performed.     IMPRESSION: This study is within normal limits. No seizures or epileptiform discharges were seen throughout the recording.  A normal interictal EEG does not exclude the diagnosis of epilepsy.   Cassandra Adams

## 2023-09-28 NOTE — Progress Notes (Signed)
 Pt reported a cheat pain of 5 on a scale of 0-10 that she described as a shooting chest pain. Pt A&Ox4 HR 52 RR 16 BP 150/46 (82)  Dr. Thomes Dinning notified. EKG obtained. A[place]o advised to cont.monitoring pt for any changes

## 2023-09-28 NOTE — Plan of Care (Signed)

## 2023-09-28 NOTE — H&P (Signed)
 History and Physical    Patient: Cassandra Adams NID:[id number] DOB: [birth year] DOA: 09/27/2023 DOS: the patient was seen and examined on 09/28/2023 PCP: Assunta Found, MD  Patient coming from: Home  Chief Complaint:  Chief Complaint  Patient presents with   Altered Mental Status    Due to hypoglycemia   HPI: Cassandra Adams is a 78 year old female with a history of CAD/NSTEMI (Feb 2023), paroxysmal atrial fibrillation, CKD stage III, diabetes mellitus type 2, hypertension, hyperlipidemia, and sick sinus syndrome presenting with altered mental status.  At baseline, the patient lives by herself and ambulates with a cane and a walker.  She is independent with her ADLs.  Apparently, the patient's grandson found the patient confused and diaphoretic on the ground around 9 PM on 09/27/2023.  The last thing the patient remembered was seeing her grandson around 5 PM on 09/27/2023. The patient had been in her usual state of health.  She denies any new medications.  Patient denies fevers, chills, headache, chest pain, dyspnea, nausea, vomiting, diarrhea, abdominal pain, dysuria, hematuria, hematochezia, and melena. Patient states that she takes her own medications, but her daughter does live out her medications.  She denies any recent changes in her medications. She was recently admitted to the hospital from 09/17/2023 to 09/18/2023 secondary to hypertensive urgency.  The patient was started on nitroglycerin drip at that time.  Her antihypertensive medications were re-introduced with improvement of her BP.  Her amlodipine was increased from 5 to 10 mg.  The patient denies any focal extremity weakness, headache, neck pain, dysesthesias. Her grandson activated EMS.  Apparently the patient was noted to have glucose of 44.  Glucagon was given with improvement to 67.  In the ED, the patient was afebrile and hemodynamically stable with oxygen saturation 96% room air.  Her CBG initially was 59 in the ED.  She  was noted to be hypothermic with a temperature of 92.7 F.  Given the constellation of her symptoms and altered mental status, she was admitted for further evaluation and treatment. WBC 3.4, hemoglobin 15.2, plates 144.  Sodium 138, potassium 3.9, bicarbonate 26, serum creatinine 1.99.  AST 38, ALT 22, alk phosphatase 79, total bilirubin 0.6, corrected calcium 9.9.  EKG showed sinus rhythm with nonspecific T wave changes.  Lactic acid 1.8>> 0.9.  CPK 401.  Urine drug screen negative.  Urinalysis negative for pyuria.  COVID-19 PCR is negative.  CT brain and CT cervical spine were negative for any acute findings.  X-rays of her left elbow, left wrist, and left shoulder were negative for any dislocation or fracture. CT of the chest, abdomen, and pelvis showed mild tree-in-bud nodularity in the right upper lobe.  There is remote anterior wedge deformity of T12 with 50% loss in height.  There were changes consistent with pulmonary hypertension.   Review of Systems: As mentioned in the history of present illness. All other systems reviewed and are negative. Past Medical History:  Diagnosis Date   (HFpEF) heart failure with preserved ejection fraction (HCC)    Limited Echo 7/22: Mild asymmetrical LVH due to scar and thinning of basal posterior wall and background conc LVH, apical and mid segments hyperdynamic, EF 60-65, basal inf-lat and basal inf AK, normal RVSF, RVSP 26.6, mild LAE, mild-moderate MR, mild AV sclerosis w/o AS // Echo 5/22: EF 55-60, RVSP 27, mild to mod LAE, poss small to mod eff; AV sclerosis no AS   Arthritis    "all over"   CAD (coronary artery  disease)    a. NSTEMI 12/2013 - occluded dominant RCA with L-R collaterals, moderate prox segmental LAD disease, for medical therapy initially, EF 60% with subtle inferobasal hypokinesia. Consider PCI for refractory CP.   CKD (chronic kidney disease), stage III (HCC)    Hypertension    Migraines    "weekly" (01/06/2014)   Sickle cell trait (HCC)     TIA (transient ischemic attack) 1978   Type II diabetes mellitus (HCC)    Vertigo    Past Surgical History:  Procedure Laterality Date   APPENDECTOMY  March 2014   had appendix frozen   CARDIAC CATHETERIZATION  "years ago" & 01/05/2014   CARDIOVERSION N/A 03/17/2014   Procedure: CARDIOVERSION;  Surgeon: Chrystie Nose, MD;  Location: [facility] ENDOSCOPY;  Service: Cardiovascular;  Laterality: N/A;   CARDIOVERSION N/A 01/14/2021   Procedure: CARDIOVERSION;  Surgeon: Chrystie Nose, MD;  Location: [facility] ENDOSCOPY;  Service: Cardiovascular;  Laterality: N/A;   CARDIOVERSION N/A 12/13/2021   Procedure: CARDIOVERSION;  Surgeon: Vesta Mixer, MD;  Location: [facility] ENDOSCOPY;  Service: Cardiovascular;  Laterality: N/A;   CATARACT EXTRACTION W/ INTRAOCULAR LENS  IMPLANT, BILATERAL Bilateral 04/2013-05/2013   CORONARY ATHERECTOMY N/A 08/05/2021   Procedure: CORONARY ATHERECTOMY;  Surgeon: Tonny Bollman, MD;  Location: [facility] INVASIVE CV LAB;  Service: Cardiovascular;  Laterality: N/A;   CORONARY PRESSURE/FFR STUDY N/A 08/04/2021   Procedure: INTRAVASCULAR PRESSURE WIRE/FFR STUDY;  Surgeon: Yvonne Kendall, MD;  Location: [facility] INVASIVE CV LAB;  Service: Cardiovascular;  Laterality: N/A;   CORONARY STENT INTERVENTION N/A 08/05/2021   Procedure: CORONARY STENT INTERVENTION;  Surgeon: Tonny Bollman, MD;  Location: [facility] INVASIVE CV LAB;  Service: Cardiovascular;  Laterality: N/A;   ENDARTERECTOMY Right 02/05/2017   Procedure: ENDARTERECTOMY CAROTID-RIGHT;  Surgeon: Sherren Kerns, MD;  Location: [facility] OR;  Service: Vascular;  Laterality: Right;   ESOPHAGOGASTRODUODENOSCOPY  11/08/2007    Normal esophagus without evidence of Barrett, mass, erosion/ Normal stomach, duodenal bulb   ESOPHAGOGASTRODUODENOSCOPY (EGD) WITH PROPOFOL N/A 11/08/2020   Procedure: ESOPHAGOGASTRODUODENOSCOPY (EGD) WITH PROPOFOL;  Surgeon: Tressia Danas, MD;  Location: [facility] ENDOSCOPY;  Service: Gastroenterology;  Laterality: N/A;   GASTRIC  MOTILITY STUDY  11/13/2007   mildly delayed emptying subjectively, but normal  analysis-77% of tracer emptied at 2 hours   JOINT REPLACEMENT     LAPAROSCOPIC APPENDECTOMY N/A 09/15/2012   Procedure: APPENDECTOMY LAPAROSCOPIC;  Surgeon: Dalia Heading, MD;  Location: [facility] ORS;  Service: General;  Laterality: N/A;   LEFT HEART CATH AND CORONARY ANGIOGRAPHY N/A 08/04/2021   Procedure: LEFT HEART CATH AND CORONARY ANGIOGRAPHY;  Surgeon: Yvonne Kendall, MD;  Location: [facility] INVASIVE CV LAB;  Service: Cardiovascular;  Laterality: N/A;   LEFT HEART CATHETERIZATION WITH CORONARY ANGIOGRAM N/A 01/05/2014   Procedure: LEFT HEART CATHETERIZATION WITH CORONARY ANGIOGRAM;  Surgeon: Runell Gess, MD;  Location: [facility] CATH LAB;  Service: Cardiovascular;  Laterality: N/A;   REPLACEMENT TOTAL KNEE Right 05-03-06   TEE WITHOUT CARDIOVERSION N/A 03/17/2014   Procedure: TRANSESOPHAGEAL ECHOCARDIOGRAM (TEE);  Surgeon: Chrystie Nose, MD;  Location: [facility] ENDOSCOPY;  Service: Cardiovascular;  Laterality: N/A;   TONSILLECTOMY  1972   Social History:  reports that she has never smoked. She has never used smokeless tobacco. She reports current alcohol use. She reports that she does not use drugs.  Allergies  Allergen Reactions   Penicillins Hives    Has patient had a PCN reaction causing immediate rash, facial/tongue/throat swelling, SOB or lightheadedness with hypotension: no Has patient had a PCN reaction causing  severe rash involving mucus membranes or skin necrosis: No  Has patient had a PCN reaction that required hospitalization: no Has patient had a PCN reaction occurring within the last 10 years: no If all of the above answers are "NO", then may proceed with Cephalosporin use.     Family History  Problem Relation Age of Onset   Hyperlipidemia Mother    Hypertension Mother    Heart attack Mother    Cancer Father        lung   Hypertension Sister    Diabetes Brother    Heart disease Brother    Hyperlipidemia  Brother    Hypertension Brother    Heart attack Brother    Other Brother        DVT    Prior to Admission medications   Medication Sig Start Date End Date Taking? Authorizing Provider  amLODipine (NORVASC) 10 MG tablet Take 1 tablet (10 mg total) by mouth daily. 09/19/23   Sherryll Burger, Pratik D, DO  amLODipine (NORVASC) 5 MG tablet Take 1 tablet (5 mg total) by mouth daily. 10/18/22   Hilty, Lisette Abu, MD  atorvastatin (LIPITOR) 80 MG tablet Take 1 tablet (80 mg total) by mouth every evening. 12/21/20   Hilty, Lisette Abu, MD  BD INSULIN SYRINGE U/F 31G X 5/16" 0.5 ML MISC Use to take with Novolog [facility] 01/07/21   Roma Kayser, MD  carvedilol (COREG) 3.125 MG tablet Take 1 tablet (3.125 mg total) by mouth 2 (two) times daily with a meal. 10/18/22   Hilty, Lisette Abu, MD  clopidogrel (PLAVIX) 75 MG tablet Take 1 tablet (75 mg total) by mouth daily. 09/17/21   Catarina Hartshorn, MD  Continuous Glucose Sensor (DEXCOM G7 SENSOR) MISC 1 Device by Does not apply route as directed. 07/16/23   Shamleffer, Konrad Dolores, MD  ELIQUIS 5 MG TABS tablet TAKE (1) TABLET BY MOUTH TWICE DAILY. 05/24/22   Hilty, Lisette Abu, MD  empagliflozin (JARDIANCE) 10 MG TABS tablet Take 1 tablet (10 mg total) by mouth daily. 12/04/22   Shamleffer, Konrad Dolores, MD  furosemide (LASIX) 40 MG tablet Take 1.5 tablets (60 mg total) by mouth daily. 01/05/23 12/31/23  Jodelle Gross, NP  insulin aspart (NOVOLOG FLEXPEN) 100 UNIT/ML FlexPen Max daily 30 units Patient taking differently: 6 Units 3 (three) times daily with meals. 12/04/22   Shamleffer, Konrad Dolores, MD  Insulin Glargine (BASAGLAR KWIKPEN) 100 UNIT/ML Inject 28 Units into the skin daily. 12/04/22   Shamleffer, Konrad Dolores, MD  Insulin Pen Needle 32G X 4 MM MISC 1 Device by Does not apply route in the morning, at noon, in the evening, and at bedtime. 12/04/22   Shamleffer, Konrad Dolores, MD  isosorbide mononitrate (IMDUR) 60 MG 24 hr tablet Take 60 mg by mouth daily.     [provider]  methocarbamol (ROBAXIN) 500 MG tablet Take 500 mg by mouth every 8 (eight) hours as needed for muscle spasms. 09/13/23   [provider]  nitroGLYCERIN (NITROSTAT) 0.4 MG SL tablet PLACE 1 TABLET UNDER THE TONGUE EVERY 5 MINUTES AS NEEDED FOR CHEST PAIN (UP TO 3 DOSES) Patient taking differently: Place 0.4 mg under the tongue every 5 (five) minutes x 3 doses as needed for chest pain. 01/02/22   Hilty, Lisette Abu, MD  ONETOUCH ULTRA test strip TESTING 3 TIMES DAILY 06/20/21   [provider]  polyethylene glycol (MIRALAX / GLYCOLAX) 17 g packet Take 17 g by mouth daily. 11/12/20   Noralee Stain,  DO  sacubitril-valsartan (ENTRESTO) 49-51 MG TAKE (1) TABLET BY MOUTH TWICE DAILY. 05/17/23   Jodelle Gross, NP    Physical Exam: Vitals:   09/28/23 0400 09/28/23 0500 09/28/23 0600 09/28/23 0700  BP: (!) 150/46 (!) 135/41 (!) 137/43 (!) 144/45  Pulse: (!) 50 (!) 50 (!) 51 (!) 51  Resp: 14 14 13  (!) 6  Temp: 98.2 F (36.8 C)     TempSrc:      SpO2: 98% 96% 95% 96%  Weight:      Height:       GENERAL:  A&O x 3, NAD, well developed, cooperative, follows commands HEENT: [place]/AT, No thrush, No icterus, No oral ulcers Neck:  No neck mass, No meningismus, soft, supple CV: RRR, no S3, no S4, no rub, no JVD Lungs:  CTA, no wheeze, no rhonchi, good air movement Abd: soft/NT +BS, nondistended Ext: No edema, no lymphangitis, no cyanosis, no rashes Neuro:  CN II-XII intact, strength 4/5 in RUE, RLE, strength 4/5 LUE, LLE; sensation intact bilateral; no dysmetria; babinski equivocal  Data Reviewed: Data reviewed above in history Assessment and Plan: Acute metabolic encephalopathy -Likely secondary to hypoglycemia and possible infectious process -Mental status is improving -CT brain negative for acute findings -No focal deficits on exam -B12 -TSH -UA negative for pyuria -A.m. cortisol  Hypothermia -Follow blood cultures -TSH -A.m. cortisol -Lactic  acid 1.8>> 0.9 -UA negative for pyuria  Pulmonary infiltrates -Certainly, patient may have a degree of aspiration pneumonitis given her unresponsiveness -PCT -Start empiric ceftriaxone -CT chest as discussed above  Chronic HFpEF -Clinically compensated -09/15/2021 echo EF 55 to 60% -Continue carvedilol -continue Entresto  Rhabdomyolysis -Mild -Judicious IV fluids -Repeat CPK  Essential hypertension -Restart amlodipine, carvedilol, Entresto, Imdur  CKD stage IIIb -Baseline creatinine 1.6-1.8  Uncontrolled diabetes mellitus type 2 with hyperglycemia -07/16/2023 hemoglobin A1c 9.1 -NovoLog sliding scale -Start reduced dose Semglee  Chronic atrial fibrillation -Rate controlled -Continue apixaban -Continue carvedilol  Coronary artery disease -No chest pain presently -Continue carvedilol, Imdur, Plavix  Mixed hyperlipidemia -Continue statin      Advance Care Planning: FULL  Consults: none  Family Communication: left VM for daughter  Severity of Illness: The appropriate patient status for this patient is INPATIENT. Inpatient status is judged to be reasonable and necessary in order to provide the required intensity of service to ensure the patient's safety. The patient's presenting symptoms, physical exam findings, and initial radiographic and laboratory data in the context of their chronic comorbidities is felt to place them at high risk for further clinical deterioration. Furthermore, it is not anticipated that the patient will be medically stable for discharge from the hospital within 2 midnights of admission.   * I certify that at the point of admission it is my clinical judgment that the patient will require inpatient hospital care spanning beyond 2 midnights from the point of admission due to high intensity of service, high risk for further deterioration and high frequency of surveillance required.*  Author: Catarina Hartshorn, MD 09/28/2023 8:07 AM  For on call review  [email or web address].

## 2023-09-28 NOTE — Plan of Care (Signed)
  Problem: Education: Goal: Knowledge of General Education information will improve Description: Including pain rating scale, medication(s)/side effects and non-pharmacologic comfort measures Outcome: Progressing   Problem: Coping: Goal: Level of anxiety will decrease Outcome: Progressing   Problem: Skin Integrity: Goal: Risk for impaired skin integrity will decrease Outcome: Progressing   

## 2023-09-28 NOTE — ED Provider Notes (Signed)
 [place] EMERGENCY DEPARTMENT AT [facility] Provider Note  CSN: [id number] Arrival date & time: 09/27/23 2205  Chief Complaint(s) Altered Mental Status (Due to hypoglycemia)  HPI Cassandra Adams is a 78 y.o. female history of CHF, coronary disease, CKD, hypertension, diabetes presenting with confusion.  Patient last seen normal by family member around 5 PM.  Son then saw patient at home at 50 PM, patient was on the ground, very diaphoretic, confused.  Paramedics were called, found blood glucose was 44.  She was given glucagon with improvement to 67.  Patient reports some lower abdominal pain, left arm pain.  Patient does not remember any events of the fall.  Patient still somewhat confused, history limited due to confusion.   Past Medical History Past Medical History:  Diagnosis Date   (HFpEF) heart failure with preserved ejection fraction (HCC)    Limited Echo 7/22: Mild asymmetrical LVH due to scar and thinning of basal posterior wall and background conc LVH, apical and mid segments hyperdynamic, EF 60-65, basal inf-lat and basal inf AK, normal RVSF, RVSP 26.6, mild LAE, mild-moderate MR, mild AV sclerosis w/o AS // Echo 5/22: EF 55-60, RVSP 27, mild to mod LAE, poss small to mod eff; AV sclerosis no AS   Arthritis    "all over"   CAD (coronary artery disease)    a. NSTEMI 12/2013 - occluded dominant RCA with L-R collaterals, moderate prox segmental LAD disease, for medical therapy initially, EF 60% with subtle inferobasal hypokinesia. Consider PCI for refractory CP.   CKD (chronic kidney disease), stage III (HCC)    Hypertension    Migraines    "weekly" (01/06/2014)   Sickle cell trait (HCC)    TIA (transient ischemic attack) 1978   Type II diabetes mellitus (HCC)    Vertigo    Patient Active Problem List   Diagnosis Date Noted   Hypertensive emergency 09/17/2023   Troponin I above reference range 09/17/2023   Leukopenia 09/17/2023   Chronic systolic CHF  (congestive heart failure) (HCC) 09/17/2023   Pressure injury of skin 04/14/2022   Hyperglycemia due to type 2 diabetes mellitus (HCC) 04/11/2022   Nausea & vomiting 04/11/2022   Severe nonproliferative diabetic retinopathy of right eye without macular edema associated with type 2 diabetes mellitus (HCC) 02/06/2022   Severe nonproliferative diabetic retinopathy of left eye, with macular edema, associated with type 2 diabetes mellitus (HCC) 02/06/2022   A-fib (HCC) 12/10/2021   Demand ischemia (HCC)    Mixed hyperlipidemia 09/15/2021   Diabetic foot ulcer (HCC) 09/15/2021   Chronic diastolic CHF (congestive heart failure) (HCC) 09/14/2021   NSTEMI (non-ST elevated myocardial infarction) (HCC) 08/02/2021   Sepsis (HCC) 06/11/2021   Secondary hypercoagulable state (HCC) 01/25/2021   Acute pulmonary edema (HCC)    Acute CHF (congestive heart failure) (HCC) 01/16/2021   Acute respiratory distress 01/16/2021   Microcytic anemia 01/16/2021   Hypertensive urgency 01/16/2021   Acute on chronic diastolic (congestive) heart failure (HCC) 01/16/2021   Persistent atrial fibrillation (HCC)    Gastric nodule    Acute blood loss anemia 11/07/2020   Hematemesis with nausea 11/07/2020   Atrial fibrillation, chronic (HCC) 11/07/2020   Transient hypotension 11/07/2020   Atypical chest pain 11/07/2020   Elevated troponin 11/07/2020   Syncope and collapse 11/05/2020   CAD (coronary artery disease)    Epistaxis due to trauma    Benign paroxysmal positional vertigo 05/11/2019   Chronic headaches 05/11/2019   Atrial fibrillation with RVR (HCC)  Right hemiparesis (HCC) 05/05/2019   Uncontrolled type 2 diabetes mellitus with hyperglycemia (HCC) 05/05/2019   Stage 3b chronic kidney disease (CKD) (HCC) 05/05/2019   Left-sided weakness 05/04/2019   Aphasia 02/26/2019   DM type 2 causing vascular disease (HCC) 06/11/2017   Type 2 diabetes mellitus with stage 3a chronic kidney disease, with long-term  current use of insulin (HCC) 06/11/2017   Carotid stenosis 02/05/2017   TIA (transient ischemic attack) 01/20/2017   Carotid stenosis, right 01/20/2017   Diabetic hyperosmolar non-ketotic state (HCC) 01/19/2017   AKI (acute kidney injury) (HCC) 01/19/2017   Left leg weakness 01/19/2017   Dyslipidemia 11/24/2016   Diaphoresis 02/18/2016   Hypoglycemia 02/18/2016   Bradycardia 04/22/2015   Coronary artery disease due to lipid rich plaque 02/26/2014   PAF (paroxysmal atrial fibrillation) (HCC) 02/26/2014   Occlusion and stenosis of carotid artery without mention of cerebral infarction 11/21/2013   Pain in limb-Left neck 11/21/2013   Carotid stenosis, bilateral 11/17/2011   Subclinical hyperthyroidism 02/15/2010   GERD 02/15/2010   Dysphagia 02/15/2010   Essential hypertension 02/11/2010   Home Medication(s) Prior to Admission medications   Medication Sig Start Date End Date Taking? Authorizing Provider  amLODipine (NORVASC) 10 MG tablet Take 1 tablet (10 mg total) by mouth daily. 09/19/23   Sherryll Burger, Pratik D, DO  amLODipine (NORVASC) 5 MG tablet Take 1 tablet (5 mg total) by mouth daily. 10/18/22   Hilty, Lisette Abu, MD  atorvastatin (LIPITOR) 80 MG tablet Take 1 tablet (80 mg total) by mouth every evening. 12/21/20   Hilty, Lisette Abu, MD  BD INSULIN SYRINGE U/F 31G X 5/16" 0.5 ML MISC Use to take with Novolog [facility] 01/07/21   Roma Kayser, MD  carvedilol (COREG) 3.125 MG tablet Take 1 tablet (3.125 mg total) by mouth 2 (two) times daily with a meal. 10/18/22   Hilty, Lisette Abu, MD  clopidogrel (PLAVIX) 75 MG tablet Take 1 tablet (75 mg total) by mouth daily. 09/17/21   Catarina Hartshorn, MD  Continuous Glucose Sensor (DEXCOM G7 SENSOR) MISC 1 Device by Does not apply route as directed. 07/16/23   Shamleffer, Konrad Dolores, MD  ELIQUIS 5 MG TABS tablet TAKE (1) TABLET BY MOUTH TWICE DAILY. 05/24/22   Hilty, Lisette Abu, MD  empagliflozin (JARDIANCE) 10 MG TABS tablet Take 1 tablet (10 mg total)  by mouth daily. 12/04/22   Shamleffer, Konrad Dolores, MD  furosemide (LASIX) 40 MG tablet Take 1.5 tablets (60 mg total) by mouth daily. 01/05/23 12/31/23  Jodelle Gross, NP  insulin aspart (NOVOLOG FLEXPEN) 100 UNIT/ML FlexPen Max daily 30 units Patient taking differently: 6 Units 3 (three) times daily with meals. 12/04/22   Shamleffer, Konrad Dolores, MD  Insulin Glargine (BASAGLAR KWIKPEN) 100 UNIT/ML Inject 28 Units into the skin daily. 12/04/22   Shamleffer, Konrad Dolores, MD  Insulin Pen Needle 32G X 4 MM MISC 1 Device by Does not apply route in the morning, at noon, in the evening, and at bedtime. 12/04/22   Shamleffer, Konrad Dolores, MD  isosorbide mononitrate (IMDUR) 60 MG 24 hr tablet Take 60 mg by mouth daily.    [provider]  methocarbamol (ROBAXIN) 500 MG tablet Take 500 mg by mouth every 8 (eight) hours as needed for muscle spasms. 09/13/23   [provider]  nitroGLYCERIN (NITROSTAT) 0.4 MG SL tablet PLACE 1 TABLET UNDER THE TONGUE EVERY 5 MINUTES AS NEEDED FOR CHEST PAIN (UP TO 3 DOSES) Patient taking differently: Place 0.4 mg under the tongue  every 5 (five) minutes x 3 doses as needed for chest pain. 01/02/22   Hilty, Lisette Abu, MD  ONETOUCH ULTRA test strip TESTING 3 TIMES DAILY 06/20/21   [provider]  polyethylene glycol (MIRALAX / GLYCOLAX) 17 g packet Take 17 g by mouth daily. 11/12/20   Noralee Stain, DO  sacubitril-valsartan (ENTRESTO) 49-51 MG TAKE (1) TABLET BY MOUTH TWICE DAILY. 05/17/23   Jodelle Gross, NP                                                                                                                                    Past Surgical History Past Surgical History:  Procedure Laterality Date   APPENDECTOMY  March 2014   had appendix frozen   CARDIAC CATHETERIZATION  "years ago" & 01/05/2014   CARDIOVERSION N/A 03/17/2014   Procedure: CARDIOVERSION;  Surgeon: Chrystie Nose, MD;  Location: [facility] ENDOSCOPY;  Service:  Cardiovascular;  Laterality: N/A;   CARDIOVERSION N/A 01/14/2021   Procedure: CARDIOVERSION;  Surgeon: Chrystie Nose, MD;  Location: [facility] ENDOSCOPY;  Service: Cardiovascular;  Laterality: N/A;   CARDIOVERSION N/A 12/13/2021   Procedure: CARDIOVERSION;  Surgeon: Vesta Mixer, MD;  Location: [facility] ENDOSCOPY;  Service: Cardiovascular;  Laterality: N/A;   CATARACT EXTRACTION W/ INTRAOCULAR LENS  IMPLANT, BILATERAL Bilateral 04/2013-05/2013   CORONARY ATHERECTOMY N/A 08/05/2021   Procedure: CORONARY ATHERECTOMY;  Surgeon: Tonny Bollman, MD;  Location: [facility] INVASIVE CV LAB;  Service: Cardiovascular;  Laterality: N/A;   CORONARY PRESSURE/FFR STUDY N/A 08/04/2021   Procedure: INTRAVASCULAR PRESSURE WIRE/FFR STUDY;  Surgeon: Yvonne Kendall, MD;  Location: [facility] INVASIVE CV LAB;  Service: Cardiovascular;  Laterality: N/A;   CORONARY STENT INTERVENTION N/A 08/05/2021   Procedure: CORONARY STENT INTERVENTION;  Surgeon: Tonny Bollman, MD;  Location: [facility] INVASIVE CV LAB;  Service: Cardiovascular;  Laterality: N/A;   ENDARTERECTOMY Right 02/05/2017   Procedure: ENDARTERECTOMY CAROTID-RIGHT;  Surgeon: Sherren Kerns, MD;  Location: [facility] OR;  Service: Vascular;  Laterality: Right;   ESOPHAGOGASTRODUODENOSCOPY  11/08/2007    Normal esophagus without evidence of Barrett, mass, erosion/ Normal stomach, duodenal bulb   ESOPHAGOGASTRODUODENOSCOPY (EGD) WITH PROPOFOL N/A 11/08/2020   Procedure: ESOPHAGOGASTRODUODENOSCOPY (EGD) WITH PROPOFOL;  Surgeon: Tressia Danas, MD;  Location: [facility] ENDOSCOPY;  Service: Gastroenterology;  Laterality: N/A;   GASTRIC MOTILITY STUDY  11/13/2007   mildly delayed emptying subjectively, but normal  analysis-77% of tracer emptied at 2 hours   JOINT REPLACEMENT     LAPAROSCOPIC APPENDECTOMY N/A 09/15/2012   Procedure: APPENDECTOMY LAPAROSCOPIC;  Surgeon: Dalia Heading, MD;  Location: [facility] ORS;  Service: General;  Laterality: N/A;   LEFT HEART CATH AND CORONARY ANGIOGRAPHY N/A 08/04/2021    Procedure: LEFT HEART CATH AND CORONARY ANGIOGRAPHY;  Surgeon: Yvonne Kendall, MD;  Location: [facility] INVASIVE CV LAB;  Service: Cardiovascular;  Laterality: N/A;   LEFT HEART CATHETERIZATION WITH CORONARY ANGIOGRAM N/A 01/05/2014   Procedure: LEFT HEART CATHETERIZATION WITH CORONARY ANGIOGRAM;  Surgeon: Runell Gess, MD;  Location: [facility] CATH LAB;  Service: Cardiovascular;  Laterality: N/A;   REPLACEMENT TOTAL KNEE Right 05-03-06   TEE WITHOUT CARDIOVERSION N/A 03/17/2014   Procedure: TRANSESOPHAGEAL ECHOCARDIOGRAM (TEE);  Surgeon: Chrystie Nose, MD;  Location: [facility] ENDOSCOPY;  Service: Cardiovascular;  Laterality: N/A;   TONSILLECTOMY  1972   Family History Family History  Problem Relation Age of Onset   Hyperlipidemia Mother    Hypertension Mother    Heart attack Mother    Cancer Father        lung   Hypertension Sister    Diabetes Brother    Heart disease Brother    Hyperlipidemia Brother    Hypertension Brother    Heart attack Brother    Other Brother        DVT    Social History Social History   Tobacco Use   Smoking status: Never   Smokeless tobacco: Never  Vaping Use   Vaping status: Never Used  Substance Use Topics   Alcohol use: Yes    Comment: rare   Drug use: No   Allergies Penicillins  Review of Systems Review of Systems  All other systems reviewed and are negative.   Physical Exam Vital Signs  I have reviewed the triage vital signs BP (!) 186/77   Pulse (!) 49   Temp (!) 92.7 F (33.7 C) (Rectal)   Resp 15   Ht 5\' 6"  (1.676 m)   Wt 75.8 kg   BMI 26.95 kg/m  Physical Exam Vitals and nursing note reviewed.  Constitutional:      General: She is in acute distress.     Appearance: She is well-developed. She is diaphoretic.  HENT:     Head: Normocephalic and atraumatic.     Mouth/Throat:     Mouth: Mucous membranes are dry.  Eyes:     Pupils: Pupils are equal, round, and reactive to light.  Cardiovascular:     Rate and Rhythm: Normal rate and  regular rhythm.     Heart sounds: No murmur heard. Pulmonary:     Effort: Pulmonary effort is normal. No respiratory distress.     Breath sounds: Normal breath sounds.  Abdominal:     General: Abdomen is flat.     Palpations: Abdomen is soft.     Tenderness: There is abdominal tenderness (generalized).  Musculoskeletal:        General: No tenderness.     Right lower leg: No edema.     Left lower leg: No edema.     Comments: C-collar in place.  No midline C-spine tenderness.  No chest wall tenderness or crepitus.  Right upper extremity with no limitation to range of motion, tenderness, deformity.  Left upper extremity with mild diffuse tenderness, painful range of motion at the shoulder and elbow.  Bilateral lower extremities with intact range of motion, no focal tenderness  Skin:    General: Skin is warm.  Neurological:     Mental Status: She is alert.     Comments: Cranial nerve deficit, moves all 4 extremities equally.  Oriented to self, year, not to situation or place.  Psychiatric:        Mood and Affect: Mood normal.        Behavior: Behavior normal.     ED Results and Treatments Labs (all labs ordered are listed, but only abnormal results are displayed) Labs Reviewed  CBC WITH DIFFERENTIAL/PLATELET - Abnormal; Notable for the following components:  Result Value   WBC 3.4 (*)    RBC 5.43 (*)    Hemoglobin 15.2 (*)    All other components within normal limits  CBG MONITORING, ED - Abnormal; Notable for the following components:   Glucose-Capillary 59 (*)    All other components within normal limits  I-STAT CHEM 8, ED - Abnormal; Notable for the following components:   BUN 32 (*)    Creatinine, Ser 2.00 (*)    Glucose, Bld 163 (*)    All other components within normal limits  CBG MONITORING, ED - Abnormal; Notable for the following components:   Glucose-Capillary 173 (*)    All other components within normal limits  CULTURE, BLOOD (ROUTINE X 2)  CULTURE, BLOOD  (ROUTINE X 2)  RESP PANEL BY RT-PCR (RSV, FLU A&B, COVID)  RVPGX2  URINALYSIS, W/ REFLEX TO CULTURE (INFECTION SUSPECTED)  COMPREHENSIVE METABOLIC PANEL WITH GFR  LACTIC ACID, PLASMA  LACTIC ACID, PLASMA  CK                                                                                                                          Radiology No results found.  Pertinent labs & imaging results that were available during my care of the patient were reviewed by me and considered in my medical decision making (see MDM for details).  Medications Ordered in ED Medications  ondansetron (ZOFRAN) injection 4 mg (has no administration in time range)  sodium chloride 0.9 % bolus 1,000 mL (1,000 mLs Intravenous New Bag/Given 09/27/23 2309)  dextrose 50 % solution 50 mL (50 mLs Intravenous Given 09/27/23 2304)                                                                                                                                     Procedures .Critical Care  Performed by: Lonell Grandchild, MD Authorized by: Lonell Grandchild, MD   Critical care provider statement:    Critical care time (minutes):  30   Critical care was necessary to treat or prevent imminent or life-threatening deterioration of the following conditions:  Metabolic crisis   Critical care was time spent personally by me on the following activities:  Development of treatment plan with patient or surrogate, discussions with consultants, evaluation of patient's response to treatment, examination of patient, ordering and review of laboratory studies, ordering and review of radiographic studies, ordering and  performing treatments and interventions, pulse oximetry, re-evaluation of patient's condition and review of old charts   (including critical care time)  Medical Decision Making / ED Course   MDM:  78 year old presenting to the emergency department with altered mental status.  Patient found down at home.  Last seen normal  was 5 PM.  Suspect due to hypoglycemia.  Unclear trigger of hypoglycemia.  Patient amnestic to events.  Blood glucose was low here.  IV was placed in the left AC, but extravasated after getting very small amount of D50.  Nursing following protocol for extravasation of D50.  Suspect this could be the cause of the patient's left arm pain but will obtain x-rays given fall.  Patient also had IV placed in the right AC with ultrasound, received D50 with improvement.  Given fall, altered mental status will obtain CT head, CT cervical spine.  Patient also still confused, unable to provide very accurate history, so obtain CT chest abdomen pelvis.  Will obtain x-rays of the left upper extremity.  Patient found to be hypothermic so we will initiate active rewarming measures.  Will obtain blood cultures, check lactic acid.  Signed out to oncoming provider pending reassessment.  Anticipate patient will need to be admitted.      Additional history obtained: -Additional history obtained from family and ems -External records from outside source obtained and reviewed including: Chart review including previous notes, labs, imaging, consultation notes including prior notes    Lab Tests: -I ordered, reviewed, and interpreted labs.   The pertinent results include:   Labs Reviewed  CBC WITH DIFFERENTIAL/PLATELET - Abnormal; Notable for the following components:      Result Value   WBC 3.4 (*)    RBC 5.43 (*)    Hemoglobin 15.2 (*)    All other components within normal limits  CBG MONITORING, ED - Abnormal; Notable for the following components:   Glucose-Capillary 59 (*)    All other components within normal limits  I-STAT CHEM 8, ED - Abnormal; Notable for the following components:   BUN 32 (*)    Creatinine, Ser 2.00 (*)    Glucose, Bld 163 (*)    All other components within normal limits  CBG MONITORING, ED - Abnormal; Notable for the following components:   Glucose-Capillary 173 (*)    All other  components within normal limits  CULTURE, BLOOD (ROUTINE X 2)  CULTURE, BLOOD (ROUTINE X 2)  RESP PANEL BY RT-PCR (RSV, FLU A&B, COVID)  RVPGX2  URINALYSIS, W/ REFLEX TO CULTURE (INFECTION SUSPECTED)  COMPREHENSIVE METABOLIC PANEL WITH GFR  LACTIC ACID, PLASMA  LACTIC ACID, PLASMA  CK    Notable for mild leukopenia, elevated creatinine, hypoglycemia     Medicines ordered and prescription drug management: Meds ordered this encounter  Medications   DISCONTD: dextrose 50 % solution 50 mL   sodium chloride 0.9 % bolus 1,000 mL   dextrose 50 % solution 50 mL   ondansetron (ZOFRAN) injection 4 mg    -I have reviewed the patients home medicines and have made adjustments as needed   Cardiac Monitoring: The patient was maintained on a cardiac monitor.  I personally viewed and interpreted the cardiac monitored which showed an underlying rhythm of: sinus rhythm   Social Determinants of Health:  Diagnosis or treatment significantly limited by social determinants of health: obesity   Reevaluation: After the interventions noted above, I reevaluated the patient and found that their symptoms have improved  Co morbidities that complicate the patient  evaluation  Past Medical History:  Diagnosis Date   (HFpEF) heart failure with preserved ejection fraction (HCC)    Limited Echo 7/22: Mild asymmetrical LVH due to scar and thinning of basal posterior wall and background conc LVH, apical and mid segments hyperdynamic, EF 60-65, basal inf-lat and basal inf AK, normal RVSF, RVSP 26.6, mild LAE, mild-moderate MR, mild AV sclerosis w/o AS // Echo 5/22: EF 55-60, RVSP 27, mild to mod LAE, poss small to mod eff; AV sclerosis no AS   Arthritis    "all over"   CAD (coronary artery disease)    a. NSTEMI 12/2013 - occluded dominant RCA with L-R collaterals, moderate prox segmental LAD disease, for medical therapy initially, EF 60% with subtle inferobasal hypokinesia. Consider PCI for refractory CP.   CKD  (chronic kidney disease), stage III (HCC)    Hypertension    Migraines    "weekly" (01/06/2014)   Sickle cell trait (HCC)    TIA (transient ischemic attack) 1978   Type II diabetes mellitus (HCC)    Vertigo       Dispostion: Disposition decision including need for hospitalization was considered, and patient disposition pending at time of sign out.    Final Clinical Impression(s) / ED Diagnoses Final diagnoses:  Hypoglycemia     This chart was dictated using voice recognition software.  Despite best efforts to proofread,  errors can occur which can change the documentation meaning.    Lonell Grandchild, MD 09/28/23 [phone]

## 2023-09-29 ENCOUNTER — Other Ambulatory Visit (HOSPITAL_COMMUNITY)

## 2023-09-29 ENCOUNTER — Other Ambulatory Visit (HOSPITAL_COMMUNITY): Payer: Self-pay | Admitting: *Deleted

## 2023-09-29 DIAGNOSIS — N1832 Chronic kidney disease, stage 3b: Secondary | ICD-10-CM | POA: Diagnosis not present

## 2023-09-29 DIAGNOSIS — G9341 Metabolic encephalopathy: Secondary | ICD-10-CM | POA: Diagnosis not present

## 2023-09-29 DIAGNOSIS — Z794 Long term (current) use of insulin: Secondary | ICD-10-CM | POA: Diagnosis not present

## 2023-09-29 DIAGNOSIS — E1165 Type 2 diabetes mellitus with hyperglycemia: Secondary | ICD-10-CM | POA: Diagnosis not present

## 2023-09-29 LAB — CBC
HCT: 31.9 % — ABNORMAL LOW (ref 36.0–46.0)
Hemoglobin: 11.1 g/dL — ABNORMAL LOW (ref 12.0–15.0)
MCH: 28.5 pg (ref 26.0–34.0)
MCHC: 34.8 g/dL (ref 30.0–36.0)
MCV: 82 fL (ref 80.0–100.0)
Platelets: 152 10*3/uL (ref 150–400)
RBC: 3.89 MIL/uL (ref 3.87–5.11)
RDW: 15.2 % (ref 11.5–15.5)
WBC: 4 10*3/uL (ref 4.0–10.5)
nRBC: 0 % (ref 0.0–0.2)

## 2023-09-29 LAB — BASIC METABOLIC PANEL WITH GFR
Anion gap: 8 (ref 5–15)
BUN: 33 mg/dL — ABNORMAL HIGH (ref 8–23)
CO2: 26 mmol/L (ref 22–32)
Calcium: 9 mg/dL (ref 8.9–10.3)
Chloride: 106 mmol/L (ref 98–111)
Creatinine, Ser: 1.9 mg/dL — ABNORMAL HIGH (ref 0.44–1.00)
GFR, Estimated: 27 mL/min — ABNORMAL LOW (ref >60)
Glucose, Bld: 101 mg/dL — ABNORMAL HIGH (ref 70–99)
Potassium: 3.5 mmol/L (ref 3.5–5.1)
Sodium: 140 mmol/L (ref 135–145)

## 2023-09-29 LAB — MAGNESIUM: Magnesium: 2.3 mg/dL (ref 1.7–2.4)

## 2023-09-29 LAB — GLUCOSE, CAPILLARY: Glucose-Capillary: 135 mg/dL — ABNORMAL HIGH (ref 70–99)

## 2023-09-29 LAB — CK: Total CK: 192 U/L (ref 38–234)

## 2023-09-29 MED ORDER — CEFDINIR 300 MG PO CAPS: 300.0000 mg | ORAL_CAPSULE | Freq: Two times a day (BID) | ORAL | 0 refills | Status: DC

## 2023-09-29 MED ORDER — DOXYCYCLINE HYCLATE 100 MG PO TABS: 100.0000 mg | ORAL_TABLET | Freq: Two times a day (BID) | ORAL | 0 refills | Status: DC

## 2023-09-29 MED ORDER — BASAGLAR KWIKPEN 100 UNIT/ML ~~LOC~~ SOPN: 22.0000 [IU] | PEN_INJECTOR | Freq: Every day | SUBCUTANEOUS | Status: DC

## 2023-09-29 NOTE — Plan of Care (Signed)
  Problem: Education: Goal: Knowledge of General Education information will improve Description: Including pain rating scale, medication(s)/side effects and non-pharmacologic comfort measures Outcome: Progressing   Problem: Activity: Goal: Risk for activity intolerance will decrease Outcome: Progressing   Problem: Nutrition: Goal: Adequate nutrition will be maintained Outcome: Progressing   Problem: Coping: Goal: Level of anxiety will decrease Outcome: Progressing   Problem: Elimination: Goal: Will not experience complications related to urinary retention Outcome: Progressing   Problem: Pain Managment: Goal: General experience of comfort will improve and/or be controlled Outcome: Progressing   Problem: Safety: Goal: Ability to remain free from injury will improve Outcome: Progressing

## 2023-09-29 NOTE — Progress Notes (Signed)
 All IV access removed. Patient requested that staff help her bath and patient called daughter to come get her and bring her clothes. Once ready patient taken to car by wheel chair with all discharge paperwork and allowed to go home.

## 2023-09-29 NOTE — Discharge Summary (Signed)
 Physician Discharge Summary   Patient: Cassandra Adams MRN: [id number] DOB: [birth year]  Admit date:     09/27/2023  Discharge date: 09/29/23  Discharge Physician: Onalee Hua Willian Donson   PCP: Assunta Found, MD   Recommendations at discharge:   Please follow up with primary care provider within 1-2 weeks  Please repeat BMP and CBC in one week     Hospital Course: 78 year old female with a history of CAD/NSTEMI (Feb 2023), paroxysmal atrial fibrillation, CKD stage III, diabetes mellitus type 2, hypertension, hyperlipidemia, and sick sinus syndrome presenting with altered mental status.  At baseline, the patient lives by herself and ambulates with a cane and a walker.  She is independent with her ADLs.  Apparently, the patient's grandson found the patient confused and diaphoretic on the ground around 9 PM on 09/27/2023.  The last thing the patient remembered was seeing her grandson around 5 PM on 09/27/2023. The patient had been in her usual state of health.  She denies any new medications.  Patient denies fevers, chills, headache, chest pain, dyspnea, nausea, vomiting, diarrhea, abdominal pain, dysuria, hematuria, hematochezia, and melena. Patient states that she takes her own medications, but her daughter does live out her medications.  She denies any recent changes in her medications. She was recently admitted to the hospital from 09/17/2023 to 09/18/2023 secondary to hypertensive urgency.  The patient was started on nitroglycerin drip at that time.  Her antihypertensive medications were re-introduced with improvement of her BP.  Her amlodipine was increased from 5 to 10 mg.  The patient denies any focal extremity weakness, headache, neck pain, dysesthesias. Her grandson activated EMS.  Apparently the patient was noted to have glucose of 44.  Glucagon was given with improvement to 67.  In the ED, the patient was afebrile and hemodynamically stable with oxygen saturation 96% room air.  Her CBG initially was  59 in the ED.  She was noted to be hypothermic with a temperature of 92.7 F.  Given the constellation of her symptoms and altered mental status, she was admitted for further evaluation and treatment. WBC 3.4, hemoglobin 15.2, plates 478.  Sodium 138, potassium 3.9, bicarbonate 26, serum creatinine 1.99.  AST 38, ALT 22, alk phosphatase 79, total bilirubin 0.6, corrected calcium 9.9.  EKG showed sinus rhythm with nonspecific T wave changes.  Lactic acid 1.8>> 0.9.  CPK 401.  Urine drug screen negative.  Urinalysis negative for pyuria.  COVID-19 PCR is negative.  CT brain and CT cervical spine were negative for any acute findings.  X-rays of her left elbow, left wrist, and left shoulder were negative for any dislocation or fracture. CT of the chest, abdomen, and pelvis showed mild tree-in-bud nodularity in the right upper lobe.  There is remote anterior wedge deformity of T12 with 50% loss in height.  There were changes consistent with pulmonary hypertension.   Assessment and Plan: Acute metabolic encephalopathy -Likely secondary to hypoglycemia and possible infectious process -Mental status is back to baseline at time of dc -CT brain negative for acute findings -No focal deficits on exam -B12--428 -TSH--0.584 -UA negative for pyuria -A.m. cortisol--13.1   Hypothermia -Follow blood cultures--neg to date -TSH--0.584 -A.m. cortisol 13.1 -Lactic acid 1.8>> 0.9 -UA negative for pyuria -resolved   Pulmonary infiltrates -Certainly, patient may have a degree of aspiration pneumonitis given her unresponsiveness -PCT 1.86 -Start empiric ceftriaxone>>d/c home with cefdinir and doxy x 5 days -CT chest as discussed above   Chronic HFpEF -Clinically compensated -09/15/2021 echo EF 55 to  60% -Continue carvedilol -continue Entresto   Rhabdomyolysis -Mild -Judicious IV fluids -Repeat CPK  401>>192   Essential hypertension -Restart amlodipine, carvedilol, Entresto, Imdur   CKD stage  IIIb -Baseline creatinine 1.6-1.8   Uncontrolled diabetes mellitus type 2 with hyperglycemia -07/16/2023 hemoglobin A1c 9.1 -NovoLog sliding scale -Started reduced dose Semglee -d/c home dose basaglar from 28>>22 units   Chronic atrial fibrillation -Rate controlled -Continue apixaban -Continue carvedilol   Coronary artery disease -No chest pain presently -Continue carvedilol, Imdur, Plavix   Mixed hyperlipidemia -Continue statin         Consultants: none Procedures performed: none  Disposition: Home Diet recommendation:  Carb modified diet DISCHARGE MEDICATION: Allergies as of 09/29/2023       Reactions   Penicillins Hives   Has patient had a PCN reaction causing immediate rash, facial/tongue/throat swelling, SOB or lightheadedness with hypotension: no Has patient had a PCN reaction causing severe rash involving mucus membranes or skin necrosis: No  Has patient had a PCN reaction that required hospitalization: no Has patient had a PCN reaction occurring within the last 10 years: no If all of the above answers are "NO", then may proceed with Cephalosporin use.        Medication List     TAKE these medications    amLODipine 10 MG tablet Commonly known as: NORVASC Take 1 tablet (10 mg total) by mouth daily. What changed: Another medication with the same name was removed. Continue taking this medication, and follow the directions you see here.   atorvastatin 80 MG tablet Commonly known as: LIPITOR Take 1 tablet (80 mg total) by mouth every evening.   Basaglar KwikPen 100 UNIT/ML Inject 22 Units into the skin daily. What changed: how much to take   carvedilol 3.125 MG tablet Commonly known as: COREG Take 1 tablet (3.125 mg total) by mouth 2 (two) times daily with a meal.   cefdinir 300 MG capsule Commonly known as: OMNICEF Take 1 capsule (300 mg total) by mouth 2 (two) times daily.   clopidogrel 75 MG tablet Commonly known as: PLAVIX Take 1 tablet (75  mg total) by mouth daily.   doxycycline 100 MG tablet Commonly known as: VIBRA-TABS Take 1 tablet (100 mg total) by mouth 2 (two) times daily.   Eliquis 5 MG Tabs tablet Generic drug: apixaban TAKE (1) TABLET BY MOUTH TWICE DAILY.   empagliflozin 10 MG Tabs tablet Commonly known as: Jardiance Take 1 tablet (10 mg total) by mouth daily.   Entresto 49-51 MG Generic drug: sacubitril-valsartan TAKE (1) TABLET BY MOUTH TWICE DAILY.   furosemide 40 MG tablet Commonly known as: LASIX Take 1.5 tablets (60 mg total) by mouth daily.   isosorbide mononitrate 60 MG 24 hr tablet Commonly known as: IMDUR Take 60 mg by mouth daily.   methocarbamol 500 MG tablet Commonly known as: ROBAXIN Take 500 mg by mouth every 8 (eight) hours as needed for muscle spasms.   nitroGLYCERIN 0.4 MG SL tablet Commonly known as: NITROSTAT PLACE 1 TABLET UNDER THE TONGUE EVERY 5 MINUTES AS NEEDED FOR CHEST PAIN (UP TO 3 DOSES)   NovoLOG FlexPen 100 UNIT/ML FlexPen Generic drug: insulin aspart Max daily 30 units What changed:  how much to take when to take this additional instructions   polyethylene glycol 17 g packet Commonly known as: MIRALAX / GLYCOLAX Take 17 g by mouth daily. What changed:  when to take this reasons to take this        Discharge Exam: [employer]  09/27/23 2222  Weight: 75.8 kg   HEENT:  [place]/AT, No thrush, no icterus CV:  RRR, no rub, no S3, no S4 Lung:  bibasilar rales. No wheeze Abd:  soft/+BS, NT Ext:  No edema, no lymphangitis, no synovitis, no rash   Condition at discharge: stable  The results of significant diagnostics from this hospitalization (including imaging, microbiology, ancillary and laboratory) are listed below for reference.   Imaging Studies: EEG adult Result Date: 09/28/2023 Charlsie Quest, MD     09/28/2023  5:57 PM Patient Name: Cassandra Adams MRN: [id number] Epilepsy Attending: Charlsie Quest Referring Physician/Provider: Catarina Hartshorn, MD Date: 4/42025 Duration: 22.30 mins Patient history: 78yo F with ams. EEG to evaluate for seizure Level of alertness: Awake AEDs during EEG study: None Technical aspects: This EEG study was done with scalp electrodes positioned according to the 10-20 International system of electrode placement. Electrical activity was reviewed with band pass filter of 1-70Hz , sensitivity of 7 uV/mm, display speed of 81mm/sec with a 60Hz  notched filter applied as appropriate. EEG data were recorded continuously and digitally stored.  Video monitoring was available and reviewed as appropriate. Description: The posterior dominant rhythm consists of 8-9 Hz activity of moderate voltage (25-35 uV) seen predominantly in posterior head regions, symmetric and reactive to eye opening and eye closing. There is 13 to 15 Hz beta activity distributed symmetrically and diffusely. Hyperventilation and photic stimulation were not performed.   IMPRESSION: This study is within normal limits. No seizures or epileptiform discharges were seen throughout the recording. A normal interictal EEG does not exclude the diagnosis of epilepsy. Charlsie Quest   DG Wrist Complete Left Result Date: 09/28/2023 CLINICAL DATA:  Left wrist pain EXAM: LEFT WRIST - COMPLETE 3+ VIEW COMPARISON:  None Available. FINDINGS: Normal alignment. No acute fracture or dislocation. Advanced degenerative arthritis at the base of the thumb the first carpometacarpal joint. Remaining joint spaces are preserved. Soft tissues are unremarkable. IMPRESSION: 1. Advanced degenerative arthritis at the base of the thumb. Electronically Signed   By: Helyn Numbers M.D.   On: 09/28/2023 00:52   DG Elbow Complete Left Result Date: 09/28/2023 CLINICAL DATA:  Fall, left elbow pain EXAM: LEFT ELBOW - COMPLETE 3+ VIEW COMPARISON:  None Available. FINDINGS: There is no evidence of fracture, dislocation, or joint effusion. There is no evidence of arthropathy or other focal bone  abnormality. Subcutaneous gas is seen within the left antecubital fossa, possibly related to attempts at catheterization. IMPRESSION: 1. No fracture or dislocation. 2. Subcutaneous gas within the left antecubital fossa, possibly related to attempts at catheterization. Clinical correlation advised. Electronically Signed   By: Helyn Numbers M.D.   On: 09/28/2023 00:50   DG Shoulder Left Result Date: 09/28/2023 CLINICAL DATA:  Fall, left shoulder pain EXAM: LEFT SHOULDER - 2+ VIEW COMPARISON:  None Available. FINDINGS: There is no evidence of fracture or dislocation. There is no evidence of arthropathy or other focal bone abnormality. Soft tissues are unremarkable. IMPRESSION: Negative. Electronically Signed   By: Helyn Numbers M.D.   On: 09/28/2023 00:49   DG Chest Port 1 View Result Date: 09/28/2023 CLINICAL DATA:  Cough EXAM: PORTABLE CHEST 1 VIEW COMPARISON:  None Available. FINDINGS: The heart size and mediastinal contours are within normal limits. Both lungs are clear. The visualized skeletal structures are unremarkable. IMPRESSION: No active disease. Electronically Signed   By: Helyn Numbers M.D.   On: 09/28/2023 00:49   CT CHEST ABDOMEN PELVIS WO CONTRAST Result Date: 09/28/2023  CLINICAL DATA:  Found down, altered mental status blunt chest and abdominal trauma EXAM: CT CHEST, ABDOMEN AND PELVIS WITHOUT CONTRAST TECHNIQUE: Multidetector CT imaging of the chest, abdomen and pelvis was performed following the standard protocol without IV contrast. RADIATION DOSE REDUCTION: This exam was performed according to the departmental dose-optimization program which includes automated exposure control, adjustment of the mA and/or kV according to patient size and/or use of iterative reconstruction technique. COMPARISON:  04/11/2022 FINDINGS: CT CHEST FINDINGS Cardiovascular: Extensive multi-vessel coronary artery calcification. Global cardiac size within normal limits. No pericardial effusion. Central pulmonary  arteries are enlarged in keeping with changes of pulmonary arterial hypertension. Moderate atherosclerotic calcification within the thoracic aorta. No aortic aneurysm. Mediastinum/Nodes: No enlarged mediastinal, hilar, or axillary lymph nodes. Thyroid gland, trachea, and esophagus demonstrate no significant findings. Lungs/Pleura: Mild tree-in-bud peribronchial nodularity within the right upper lobe is nonspecific. In the acute setting, this may reflect changes of acute bronchiolitis. Chronic, this may reflect changes of chronic atypical infection, such as mycobacterial or fungal pneumonia, or post inflammatory scarring. No confluent pulmonary infiltrate. No pneumothorax or pleural effusion. Musculoskeletal: Remote anterior wedge compression deformity T12 with approximately 50-60% loss of height anteriorly noted. No retropulsion. Osseous structures are otherwise age-appropriate. Respiratory motion artifact noted. No acute bone abnormality. CT ABDOMEN PELVIS FINDINGS Hepatobiliary: No focal liver abnormality is seen. No gallstones, gallbladder wall thickening, or biliary dilatation. Pancreas: Unremarkable Spleen: Unremarkable Adrenals/Urinary Tract: Adrenal glands are unremarkable. Kidneys are normal, without renal calculi, focal lesion, or hydronephrosis. Bladder is unremarkable. Stomach/Bowel: Stomach is within normal limits. Appendix absent. No evidence of bowel wall thickening, distention, or inflammatory changes. Vascular/Lymphatic: Aortic atherosclerosis. No enlarged abdominal or pelvic lymph nodes. Reproductive: Calcified involuted uterine fibroids noted within the uterus. The pelvic organs are otherwise unremarkable. Other: Mild diffuse subcutaneous body wall edema. No abdominal wall hernia. No abdominopelvic ascites. Musculoskeletal: Degenerative changes are seen within the lumbar spine. No acute bone abnormality. No lytic or blastic bone lesion. IMPRESSION: 1. No acute intrathoracic or intra-abdominal  injury identified. 2. Extensive multi-vessel coronary artery calcification. 3. Morphologic changes in keeping with pulmonary arterial hypertension. 4. Mild tree-in-bud peribronchial nodularity within the right upper lobe is nonspecific. In the acute setting, this may reflect changes of acute bronchiolitis. If chronic, this may reflect changes of chronic atypical infection, such as mycobacterial or fungal pneumonia, or post inflammatory scarring. 5. Remote anterior wedge compression deformity T12 with approximately 50-60% loss of height anteriorly. No retropulsion. Aortic Atherosclerosis (ICD10-I70.0). Electronically Signed   By: Helyn Numbers M.D.   On: 09/28/2023 00:49   CT Head Wo Contrast Result Date: 09/28/2023 CLINICAL DATA:  New onset headache. Hypoglycemia. Found unresponsive in the kitchen. EXAM: CT HEAD WITHOUT CONTRAST CT CERVICAL SPINE WITHOUT CONTRAST TECHNIQUE: Multidetector CT imaging of the head and cervical spine was performed following the standard protocol without intravenous contrast. Multiplanar CT image reconstructions of the cervical spine were also generated. RADIATION DOSE REDUCTION: This exam was performed according to the departmental dose-optimization program which includes automated exposure control, adjustment of the mA and/or kV according to patient size and/or use of iterative reconstruction technique. COMPARISON:  CT head 09/17/2023 and CT cervical spine 04/07/2022 FINDINGS: CT HEAD FINDINGS Brain: No intracranial hemorrhage, mass effect, or evidence of acute infarct. No hydrocephalus. No extra-axial fluid collection. Age related cerebral atrophy and chronic small vessel ischemic disease. Vascular: No hyperdense vessel. Intracranial arterial calcification. Skull: No fracture or focal lesion. Sinuses/Orbits: Near complete opacification of the right maxillary sinus with central hyperdense mucous.  No retro antral fat stranding. The paranasal sinuses and mastoid air cells are otherwise  well aerated. Globes are intact. Other: None. CT CERVICAL SPINE FINDINGS Alignment: No evidence of traumatic malalignment. Skull base and vertebrae: No acute fracture. No primary bone lesion or focal pathologic process. Soft tissues and spinal canal: No prevertebral fluid or swelling. No visible canal hematoma. Disc levels: Multilevel spondylosis and disc space height loss greatest at C5-C6 and C6-C7. There is ankylosis of C5-C6. Mild multilevel facet arthropathy. No severe spinal canal narrowing. Upper chest: No acute abnormality. Other: Carotid calcification. IMPRESSION: 1. No acute intracranial abnormality. 2. No cervical spine fracture. 3. Near complete opacification of the right maxillary sinus with central hyperdense mucous. Correlate for sinusitis. Electronically Signed   By: Minerva Fester M.D.   On: 09/28/2023 00:40   CT Cervical Spine Wo Contrast Result Date: 09/28/2023 CLINICAL DATA:  New onset headache. Hypoglycemia. Found unresponsive in the kitchen. EXAM: CT HEAD WITHOUT CONTRAST CT CERVICAL SPINE WITHOUT CONTRAST TECHNIQUE: Multidetector CT imaging of the head and cervical spine was performed following the standard protocol without intravenous contrast. Multiplanar CT image reconstructions of the cervical spine were also generated. RADIATION DOSE REDUCTION: This exam was performed according to the departmental dose-optimization program which includes automated exposure control, adjustment of the mA and/or kV according to patient size and/or use of iterative reconstruction technique. COMPARISON:  CT head 09/17/2023 and CT cervical spine 04/07/2022 FINDINGS: CT HEAD FINDINGS Brain: No intracranial hemorrhage, mass effect, or evidence of acute infarct. No hydrocephalus. No extra-axial fluid collection. Age related cerebral atrophy and chronic small vessel ischemic disease. Vascular: No hyperdense vessel. Intracranial arterial calcification. Skull: No fracture or focal lesion. Sinuses/Orbits: Near  complete opacification of the right maxillary sinus with central hyperdense mucous. No retro antral fat stranding. The paranasal sinuses and mastoid air cells are otherwise well aerated. Globes are intact. Other: None. CT CERVICAL SPINE FINDINGS Alignment: No evidence of traumatic malalignment. Skull base and vertebrae: No acute fracture. No primary bone lesion or focal pathologic process. Soft tissues and spinal canal: No prevertebral fluid or swelling. No visible canal hematoma. Disc levels: Multilevel spondylosis and disc space height loss greatest at C5-C6 and C6-C7. There is ankylosis of C5-C6. Mild multilevel facet arthropathy. No severe spinal canal narrowing. Upper chest: No acute abnormality. Other: Carotid calcification. IMPRESSION: 1. No acute intracranial abnormality. 2. No cervical spine fracture. 3. Near complete opacification of the right maxillary sinus with central hyperdense mucous. Correlate for sinusitis. Electronically Signed   By: Minerva Fester M.D.   On: 09/28/2023 00:40   DG Chest 2 View Result Date: 09/17/2023 CLINICAL DATA:  Chest pain. High blood pressure with dizziness, nausea, and headache. EXAM: CHEST - 2 VIEW COMPARISON:  12/09/2021 FINDINGS: Shallow inspiration. Cardiac enlargement with hazy perihilar and basilar infiltrates likely representing edema but could be pneumonia. No pleural effusion or pneumothorax. Mediastinal contours appear intact. Calcification of the aorta. IMPRESSION: Cardiac enlargement. Perihilar and basilar infiltrates suggesting edema or pneumonia. Electronically Signed   By: Burman Nieves M.D.   On: 09/17/2023 19:14   CT Head Wo Contrast Result Date: 09/17/2023 CLINICAL DATA:  Headache, new onset (Age >= 51y) EXAM: CT HEAD WITHOUT CONTRAST TECHNIQUE: Contiguous axial images were obtained from the base of the skull through the vertex without intravenous contrast. RADIATION DOSE REDUCTION: This exam was performed according to the departmental  dose-optimization program which includes automated exposure control, adjustment of the mA and/or kV according to patient size and/or use of iterative reconstruction  technique. COMPARISON:  CT head 04/07/2022 FINDINGS: Brain: No evidence of large-territorial acute infarction. No parenchymal hemorrhage. No mass lesion. No extra-axial collection. No mass effect or midline shift. No hydrocephalus. Basilar cisterns are patent. Vascular: No hyperdense vessel. Atherosclerotic calcifications are present within the cavernous internal carotid and vertebral arteries. Skull: No acute fracture or focal lesion. Sinuses/Orbits: Paranasal sinuses and mastoid air cells are clear. Bilateral lens replacement. Otherwise the orbits are unremarkable. Other: None. IMPRESSION: No acute intracranial abnormality. Electronically Signed   By: Tish Frederickson M.D.   On: 09/17/2023 19:12    Microbiology: Results for orders placed or performed during the hospital encounter of 09/27/23  Culture, blood (routine x 2)     Status: None (Preliminary result)   Collection Time: 09/27/23 11:48 PM   Specimen: BLOOD  Result Value Ref Range Status   Specimen Description BLOOD BLOOD RIGHT HAND  Final   Special Requests   Final    BOTTLES DRAWN AEROBIC ONLY Blood Culture results may not be optimal due to an inadequate volume of blood received in culture bottles   Culture   Final    NO GROWTH 2 DAYS Performed at [facility], [address] [place], [place] [address]    Report Status PENDING  Incomplete  Culture, blood (routine x 2)     Status: None (Preliminary result)   Collection Time: 09/27/23 11:48 PM   Specimen: BLOOD  Result Value Ref Range Status   Specimen Description BLOOD LEFT ANTECUBITAL  Final   Special Requests   Final    BOTTLES DRAWN AEROBIC AND ANAEROBIC Blood Culture adequate volume   Culture   Final    NO GROWTH 2 DAYS Performed at [facility], [address] [place], [place] [address]    Report Status PENDING   Incomplete  Resp panel by RT-PCR (RSV, Flu A&B, Covid) Anterior Nasal Swab     Status: None   Collection Time: 09/27/23 11:58 PM   Specimen: Anterior Nasal Swab  Result Value Ref Range Status   SARS Coronavirus 2 by RT PCR NEGATIVE NEGATIVE Final    Comment: (NOTE) SARS-CoV-2 target nucleic acids are NOT DETECTED.  The SARS-CoV-2 RNA is generally detectable in upper respiratory specimens during the acute phase of infection. The lowest concentration of SARS-CoV-2 viral copies this assay can detect is 138 copies/mL. A negative result does not preclude SARS-Cov-2 infection and should not be used as the sole basis for treatment or other patient management decisions. A negative result may occur with  improper specimen collection/handling, submission of specimen other than nasopharyngeal swab, presence of viral mutation(s) within the areas targeted by this assay, and inadequate number of viral copies(<138 copies/mL). A negative result must be combined with clinical observations, patient history, and epidemiological information. The expected result is Negative.  Fact Sheet for Patients:  BloggerCourse.com  Fact Sheet for Healthcare Providers:  SeriousBroker.it  This test is no t yet approved or cleared by the [place] FDA and  has been authorized for detection and/or diagnosis of SARS-CoV-2 by FDA under an Emergency Use Authorization (EUA). This EUA will remain  in effect (meaning this test can be used) for the duration of the COVID-19 declaration under Section 564(b)(1) of the Act, 21 U.S.C.section 360bbb-3(b)(1), unless the authorization is terminated  or revoked sooner.       Influenza A by PCR NEGATIVE NEGATIVE Final   Influenza B by PCR NEGATIVE NEGATIVE Final    Comment: (NOTE) The Xpert Xpress SARS-CoV-2/FLU/RSV plus assay is intended  as an aid in the diagnosis of influenza from Nasopharyngeal swab specimens and should  not be used as a sole basis for treatment. Nasal washings and aspirates are unacceptable for Xpert Xpress SARS-CoV-2/FLU/RSV testing.  Fact Sheet for Patients: BloggerCourse.com  Fact Sheet for Healthcare Providers: SeriousBroker.it  This test is not yet approved or cleared by the [place] FDA and has been authorized for detection and/or diagnosis of SARS-CoV-2 by FDA under an Emergency Use Authorization (EUA). This EUA will remain in effect (meaning this test can be used) for the duration of the COVID-19 declaration under Section 564(b)(1) of the Act, 21 U.S.C. section 360bbb-3(b)(1), unless the authorization is terminated or revoked.     Resp Syncytial Virus by PCR NEGATIVE NEGATIVE Final    Comment: (NOTE) Fact Sheet for Patients: BloggerCourse.com  Fact Sheet for Healthcare Providers: SeriousBroker.it  This test is not yet approved or cleared by the [place] FDA and has been authorized for detection and/or diagnosis of SARS-CoV-2 by FDA under an Emergency Use Authorization (EUA). This EUA will remain in effect (meaning this test can be used) for the duration of the COVID-19 declaration under Section 564(b)(1) of the Act, 21 U.S.C. section 360bbb-3(b)(1), unless the authorization is terminated or revoked.  Performed at [facility], [address] [place], [place] [address]   MRSA Next Gen by PCR, Nasal     Status: None   Collection Time: 09/28/23  5:40 AM   Specimen: Nasal Mucosa; Nasal Swab  Result Value Ref Range Status   MRSA by PCR Next Gen NOT DETECTED NOT DETECTED Final    Comment: (NOTE) The GeneXpert MRSA Assay (FDA approved for NASAL specimens only), is one component of a comprehensive MRSA colonization surveillance program. It is not intended to diagnose MRSA infection nor to guide or monitor treatment for MRSA infections. Test performance is not FDA  approved in patients less than 54 years old. Performed at [facility], [address] [place], [place] [address]   Respiratory (~20 pathogens) panel by PCR     Status: None   Collection Time: 09/28/23  8:50 AM   Specimen: Nasopharyngeal Swab; Respiratory  Result Value Ref Range Status   Adenovirus NOT DETECTED NOT DETECTED Final   Coronavirus 229E NOT DETECTED NOT DETECTED Final    Comment: (NOTE) The Coronavirus on the Respiratory Panel, DOES NOT test for the novel  Coronavirus (2019 nCoV)    Coronavirus HKU1 NOT DETECTED NOT DETECTED Final   Coronavirus NL63 NOT DETECTED NOT DETECTED Final   Coronavirus OC43 NOT DETECTED NOT DETECTED Final   Metapneumovirus NOT DETECTED NOT DETECTED Final   Rhinovirus / Enterovirus NOT DETECTED NOT DETECTED Final   Influenza A NOT DETECTED NOT DETECTED Final   Influenza B NOT DETECTED NOT DETECTED Final   Parainfluenza Virus 1 NOT DETECTED NOT DETECTED Final   Parainfluenza Virus 2 NOT DETECTED NOT DETECTED Final   Parainfluenza Virus 3 NOT DETECTED NOT DETECTED Final   Parainfluenza Virus 4 NOT DETECTED NOT DETECTED Final   Respiratory Syncytial Virus NOT DETECTED NOT DETECTED Final   Bordetella pertussis NOT DETECTED NOT DETECTED Final   Bordetella Parapertussis NOT DETECTED NOT DETECTED Final   Chlamydophila pneumoniae NOT DETECTED NOT DETECTED Final   Mycoplasma pneumoniae NOT DETECTED NOT DETECTED Final    Comment: Performed at [facility], [address], [place], [place] [address]    Labs: CBC: Recent Labs  Lab 09/27/23 2239 09/27/23 2327 09/29/23 0612  WBC 3.4*  --  4.0  NEUTROABS 1.8  --   --   HGB 15.2* 14.6 11.1*  HCT 43.9 43.0 31.9*  MCV 80.8  --  82.0  PLT 168  --  152   Basic Metabolic Panel: Recent Labs  Lab 09/27/23 2327 09/27/23 2348 09/29/23 0612  NA 141 138 140  K 3.8 3.9 3.5  CL 102 101 106  CO2  --  26 26  GLUCOSE 163* 382* 101*  BUN 32* 31* 33*  CREATININE 2.00* 1.99* 1.90*  CALCIUM  --  9.6  9.0  MG  --   --  2.3   Liver Function Tests: Recent Labs  Lab 09/27/23 2348  AST 38  ALT 22  ALKPHOS 79  BILITOT 0.6  PROT 7.3  ALBUMIN 3.7   CBG: Recent Labs  Lab 09/27/23 2323 09/28/23 0106 09/28/23 1548 09/28/23 2138 09/29/23 0757  GLUCAP 173* 214* 237* 271* 135*    Discharge time spent: greater than 30 minutes.  Signed: Catarina Hartshorn, MD [facility] 09/29/2023

## 2023-10-02 DIAGNOSIS — Z6827 Body mass index (BMI) 27.0-27.9, adult: Secondary | ICD-10-CM | POA: Diagnosis not present

## 2023-10-02 DIAGNOSIS — I5030 Unspecified diastolic (congestive) heart failure: Secondary | ICD-10-CM | POA: Diagnosis not present

## 2023-10-02 DIAGNOSIS — E663 Overweight: Secondary | ICD-10-CM | POA: Diagnosis not present

## 2023-10-02 DIAGNOSIS — N1832 Chronic kidney disease, stage 3b: Secondary | ICD-10-CM | POA: Diagnosis not present

## 2023-10-02 LAB — CULTURE, BLOOD (ROUTINE X 2)
Culture: NO GROWTH
Culture: NO GROWTH
Special Requests: ADEQUATE

## 2023-10-03 ENCOUNTER — Emergency Department (HOSPITAL_COMMUNITY)
Admission: EM | Admit: 2023-10-03 | Discharge: 2023-10-04 | Disposition: A | Discharge status: Home / Self Care | Attending: Emergency Medicine | Admitting: Emergency Medicine

## 2023-10-03 ENCOUNTER — Other Ambulatory Visit: Payer: Self-pay

## 2023-10-03 DIAGNOSIS — I130 Hypertensive heart and chronic kidney disease with heart failure and stage 1 through stage 4 chronic kidney disease, or unspecified chronic kidney disease: Secondary | ICD-10-CM | POA: Insufficient documentation

## 2023-10-03 DIAGNOSIS — I10 Essential (primary) hypertension: Secondary | ICD-10-CM | POA: Diagnosis not present

## 2023-10-03 DIAGNOSIS — I509 Heart failure, unspecified: Secondary | ICD-10-CM | POA: Diagnosis not present

## 2023-10-03 DIAGNOSIS — E1165 Type 2 diabetes mellitus with hyperglycemia: Secondary | ICD-10-CM | POA: Insufficient documentation

## 2023-10-03 DIAGNOSIS — R42 Dizziness and giddiness: Secondary | ICD-10-CM | POA: Diagnosis not present

## 2023-10-03 DIAGNOSIS — R739 Hyperglycemia, unspecified: Secondary | ICD-10-CM | POA: Diagnosis not present

## 2023-10-03 DIAGNOSIS — R0689 Other abnormalities of breathing: Secondary | ICD-10-CM | POA: Diagnosis not present

## 2023-10-03 DIAGNOSIS — E119 Type 2 diabetes mellitus without complications: Secondary | ICD-10-CM | POA: Insufficient documentation

## 2023-10-03 DIAGNOSIS — N183 Chronic kidney disease, stage 3 unspecified: Secondary | ICD-10-CM | POA: Diagnosis not present

## 2023-10-03 DIAGNOSIS — I2510 Atherosclerotic heart disease of native coronary artery without angina pectoris: Secondary | ICD-10-CM | POA: Insufficient documentation

## 2023-10-03 DIAGNOSIS — Z955 Presence of coronary angioplasty implant and graft: Secondary | ICD-10-CM | POA: Insufficient documentation

## 2023-10-03 DIAGNOSIS — Z96651 Presence of right artificial knee joint: Secondary | ICD-10-CM | POA: Insufficient documentation

## 2023-10-03 DIAGNOSIS — Z743 Need for continuous supervision: Secondary | ICD-10-CM | POA: Diagnosis not present

## 2023-10-03 DIAGNOSIS — R531 Weakness: Secondary | ICD-10-CM | POA: Diagnosis not present

## 2023-10-03 LAB — CBC
HCT: 39.5 % (ref 36.0–46.0)
Hemoglobin: 13.8 g/dL (ref 12.0–15.0)
MCH: 28.3 pg (ref 26.0–34.0)
MCHC: 34.9 g/dL (ref 30.0–36.0)
MCV: 81.1 fL (ref 80.0–100.0)
Platelets: 183 10*3/uL (ref 150–400)
RBC: 4.87 MIL/uL (ref 3.87–5.11)
RDW: 14.6 % (ref 11.5–15.5)
WBC: 3.3 10*3/uL — ABNORMAL LOW (ref 4.0–10.5)
nRBC: 0 % (ref 0.0–0.2)

## 2023-10-03 LAB — CBG MONITORING, ED: Glucose-Capillary: 381 mg/dL — ABNORMAL HIGH (ref 70–99)

## 2023-10-03 NOTE — ED Triage Notes (Signed)
 Pt bib [facility] from home c/o hyperglycemia and hypertension. Pt reports she started feeling weak this evening and could not get her glucose monitor to read. EMS states her CBG was 499 PTA.   CBG 381 in triage

## 2023-10-03 NOTE — ED Provider Notes (Signed)
 [facility] DE[facility] Emergency Department Provider Note MRN:  [id number]  Arrival date & time: 10/04/23     Chief Complaint   Hyperglycemia   History of Present Illness   Cassandra Adams is a 78 y.o. year-old female with a history of CHF, CAD, diabetes presenting to the ED with chief complaint of hyperglycemia.  Has been feeling weak for the past few days.  Explains that she was recently in the hospital and was discharged.  Noticed her blood sugar was low and called EMS.  With EMS the blood sugar is high.  She denies fever, no cough, no burning with urination, no pain, just general malaise and fatigue.  Review of Systems  A thorough review of systems was obtained and all systems are negative except as noted in the HPI and PMH.   Patient's Health History    Past Medical History:  Diagnosis Date   (HFpEF) heart failure with preserved ejection fraction (HCC)    Limited Echo 7/22: Mild asymmetrical LVH due to scar and thinning of basal posterior wall and background conc LVH, apical and mid segments hyperdynamic, EF 60-65, basal inf-lat and basal inf AK, normal RVSF, RVSP 26.6, mild LAE, mild-moderate MR, mild AV sclerosis w/o AS // Echo 5/22: EF 55-60, RVSP 27, mild to mod LAE, poss small to mod eff; AV sclerosis no AS   Arthritis    "all over"   CAD (coronary artery disease)    a. NSTEMI 12/2013 - occluded dominant RCA with L-R collaterals, moderate prox segmental LAD disease, for medical therapy initially, EF 60% with subtle inferobasal hypokinesia. Consider PCI for refractory CP.   CKD (chronic kidney disease), stage III (HCC)    Hypertension    Migraines    "weekly" (01/06/2014)   Sickle cell trait (HCC)    TIA (transient ischemic attack) 1978   Type II diabetes mellitus (HCC)    Vertigo     Past Surgical History:  Procedure Laterality Date   APPENDECTOMY  March 2014   had appendix frozen   CARDIAC CATHETERIZATION  "years ago" & 01/05/2014   CARDIOVERSION  N/A 03/17/2014   Procedure: CARDIOVERSION;  Surgeon: Chrystie Nose, MD;  Location: [facility] ENDOSCOPY;  Service: Cardiovascular;  Laterality: N/A;   CARDIOVERSION N/A 01/14/2021   Procedure: CARDIOVERSION;  Surgeon: Chrystie Nose, MD;  Location: [facility] ENDOSCOPY;  Service: Cardiovascular;  Laterality: N/A;   CARDIOVERSION N/A 12/13/2021   Procedure: CARDIOVERSION;  Surgeon: Vesta Mixer, MD;  Location: [facility] ENDOSCOPY;  Service: Cardiovascular;  Laterality: N/A;   CATARACT EXTRACTION W/ INTRAOCULAR LENS  IMPLANT, BILATERAL Bilateral 04/2013-05/2013   CORONARY ATHERECTOMY N/A 08/05/2021   Procedure: CORONARY ATHERECTOMY;  Surgeon: Tonny Bollman, MD;  Location: [facility] INVASIVE CV LAB;  Service: Cardiovascular;  Laterality: N/A;   CORONARY PRESSURE/FFR STUDY N/A 08/04/2021   Procedure: INTRAVASCULAR PRESSURE WIRE/FFR STUDY;  Surgeon: Yvonne Kendall, MD;  Location: [facility] INVASIVE CV LAB;  Service: Cardiovascular;  Laterality: N/A;   CORONARY STENT INTERVENTION N/A 08/05/2021   Procedure: CORONARY STENT INTERVENTION;  Surgeon: Tonny Bollman, MD;  Location: [facility] INVASIVE CV LAB;  Service: Cardiovascular;  Laterality: N/A;   ENDARTERECTOMY Right 02/05/2017   Procedure: ENDARTERECTOMY CAROTID-RIGHT;  Surgeon: Sherren Kerns, MD;  Location: [facility] OR;  Service: Vascular;  Laterality: Right;   ESOPHAGOGASTRODUODENOSCOPY  11/08/2007    Normal esophagus without evidence of Barrett, mass, erosion/ Normal stomach, duodenal bulb   ESOPHAGOGASTRODUODENOSCOPY (EGD) WITH PROPOFOL N/A 11/08/2020   Procedure: ESOPHAGOGASTRODUODENOSCOPY (EGD) WITH PROPOFOL;  Surgeon: Tressia Danas,  MD;  Location: [facility] ENDOSCOPY;  Service: Gastroenterology;  Laterality: N/A;   GASTRIC MOTILITY STUDY  11/13/2007   mildly delayed emptying subjectively, but normal  analysis-77% of tracer emptied at 2 hours   JOINT REPLACEMENT     LAPAROSCOPIC APPENDECTOMY N/A 09/15/2012   Procedure: APPENDECTOMY LAPAROSCOPIC;  Surgeon: Dalia Heading, MD;  Location:  [facility] ORS;  Service: General;  Laterality: N/A;   LEFT HEART CATH AND CORONARY ANGIOGRAPHY N/A 08/04/2021   Procedure: LEFT HEART CATH AND CORONARY ANGIOGRAPHY;  Surgeon: Yvonne Kendall, MD;  Location: [facility] INVASIVE CV LAB;  Service: Cardiovascular;  Laterality: N/A;   LEFT HEART CATHETERIZATION WITH CORONARY ANGIOGRAM N/A 01/05/2014   Procedure: LEFT HEART CATHETERIZATION WITH CORONARY ANGIOGRAM;  Surgeon: Runell Gess, MD;  Location: [facility] CATH LAB;  Service: Cardiovascular;  Laterality: N/A;   REPLACEMENT TOTAL KNEE Right 05-03-06   TEE WITHOUT CARDIOVERSION N/A 03/17/2014   Procedure: TRANSESOPHAGEAL ECHOCARDIOGRAM (TEE);  Surgeon: Chrystie Nose, MD;  Location: [facility] ENDOSCOPY;  Service: Cardiovascular;  Laterality: N/A;   TONSILLECTOMY  1972    Family History  Problem Relation Age of Onset   Hyperlipidemia Mother    Hypertension Mother    Heart attack Mother    Cancer Father        lung   Hypertension Sister    Diabetes Brother    Heart disease Brother    Hyperlipidemia Brother    Hypertension Brother    Heart attack Brother    Other Brother        DVT    Social History   Socioeconomic History   Marital status: Single    Spouse name: Not on file   Number of children: Not on file   Years of education: Not on file   Highest education level: Not on file  Occupational History   Occupation: retired  Tobacco Use   Smoking status: Never   Smokeless tobacco: Never  Vaping Use   Vaping status: Never Used  Substance and Sexual Activity   Alcohol use: Yes    Comment: rare   Drug use: No   Sexual activity: Not Currently    Birth control/protection: Post-menopausal  Other Topics Concern   Not on file  Social History Narrative   Not on file   Social Drivers of Health   Financial Resource Strain: Not on file  Food Insecurity: No Food Insecurity (09/28/2023)   Hunger Vital Sign    Worried About Running Out of Food in the Last Year: Never true    Ran Out of Food in the Last Year:  Never true  Transportation Needs: No Transportation Needs (09/28/2023)   PRAPARE - Administrator, Civil Service (Medical): No    Lack of Transportation (Non-Medical): No  Physical Activity: Not on file  Stress: Not on file  Social Connections: Socially Isolated (09/28/2023)   Social Connection and Isolation Panel [NHANES]    Frequency of Communication with Friends and Family: Three times a week    Frequency of Social Gatherings with Friends and Family: Once a week    Attends Religious Services: Never    Database administrator or Organizations: No    Attends Banker Meetings: Never    Marital Status: Separated  Intimate Partner Violence: Not At Risk (09/28/2023)   Humiliation, Afraid, Rape, and Kick questionnaire    Fear of Current or Ex-Partner: No    Emotionally Abused: No    Physically Abused: No    Sexually Abused: No  Physical Exam   Vitals:   10/03/23 2315 10/04/23 0000  BP: (!) 176/68 (!) 144/57  Pulse: 67 63  Resp: 18 19  Temp:    SpO2: 97% 97%    CONSTITUTIONAL: Well-appearing, NAD NEURO/PSYCH:  Alert and oriented x 3, no focal deficits EYES:  eyes equal and reactive ENT/NECK:  no LAD, no JVD CARDIO: Regular rate, well-perfused, normal S1 and S2 PULM:  CTAB no wheezing or rhonchi GI/GU:  non-distended, non-tender MSK/SPINE:  No gross deformities, no edema SKIN:  no rash, atraumatic   *Additional and/or pertinent findings included in MDM below  Diagnostic and Interventional Summary    EKG Interpretation Date/Time:  October 03, 2023 at 23:41:20 Ventricular Rate:   67 PR Interval:   196 QRS Duration:   118 QT Interval:   427 QTC Calculation:  451 R Axis:      Text Interpretation: Sinus rhythm, no concerning features       Labs Reviewed  CBC - Abnormal; Notable for the following components:      Result Value   WBC 3.3 (*)    All other components within normal limits  COMPREHENSIVE METABOLIC PANEL WITH GFR - Abnormal; Notable  for the following components:   Glucose, Bld 439 (*)    BUN 35 (*)    Creatinine, Ser 2.15 (*)    GFR, Estimated 23 (*)    All other components within normal limits  CBG MONITORING, ED - Abnormal; Notable for the following components:   Glucose-Capillary 381 (*)    All other components within normal limits  CBG MONITORING, ED - Abnormal; Notable for the following components:   Glucose-Capillary 397 (*)    All other components within normal limits    No orders to display    Medications  sodium chloride 0.9 % bolus 1,000 mL (1,000 mLs Intravenous New Bag/Given 10/04/23 0025)  insulin aspart (novoLOG) injection 4 Units (4 Units Intravenous Given 10/04/23 0026)     Procedures  /  Critical Care Procedures  ED Course and Medical Decision Making  Initial Impression and Ddx Per chart review patient had recent admission for encephalopathy likely due to either hypertension or hyperglycemia.  She lives alone, was found on the ground possibly for 4 hours.  I have suspicion that patient needs help managing her medications, this may be the underlying cause of her recent admission and her continued issues with hyperglycemia.  She says she took her insulin like she was supposed to.  Low concern for DKA, will check screening labs.  Overall well-appearing with no focal neurological deficits, no pain.  Past medical/surgical history that increases complexity of ED encounter: Diabetes, CAD, CHF  Interpretation of Diagnostics I personally reviewed the EKG and my interpretation is as follows: Sinus rhythm without concerning changes  No significant blood count or electrolyte disturbance.  Hyperglycemia without evidence of DKA, mild elevation in creatinine  Patient Reassessment and Ultimate Disposition/Management     Patient feels well on reassessment, normal vitals, blood sugar is downtrending with fluids and small dose of insulin, appropriate for discharge with close PCP follow-up.  She has a daughter at  home that can help her with her medications.  Patient management required discussion with the following services or consulting groups:  None  Complexity of Problems Addressed Acute illness or injury that poses threat of life of bodily function  Additional Data Reviewed and Analyzed Further history obtained from: EMS on arrival  Additional Factors Impacting ED Encounter Risk Consideration of hospitalization  Casimiro Needle  Randol Kern, MD [facility] Emergency Medicine [facility] [email or web address]  Final Clinical Impressions(s) / ED Diagnoses     ICD-10-CM   1. Hyperglycemia  R73.9       ED Discharge Orders     None        Discharge Instructions Discussed with and Provided to Patient:     Discharge Instructions      You were evaluated in the Emergency Department and after careful evaluation, we did not find any emergent condition requiring admission or further testing in the hospital.  Your exam/testing today is overall reassuring.  Very important that you follow-up closely with your primary care doctor to discuss your medications.  Keep a close eye on your blood sugars.  Please return to the Emergency Department if you experience any worsening of your condition.   Thank you for allowing [place] to be a part of your care.       Sabas Sous, MD 10/04/23 [phone]

## 2023-10-04 LAB — COMPREHENSIVE METABOLIC PANEL WITH GFR
ALT: 23 U/L (ref 0–44)
AST: 33 U/L (ref 15–41)
Albumin: 3.9 g/dL (ref 3.5–5.0)
Alkaline Phosphatase: 101 U/L (ref 38–126)
Anion gap: 12 (ref 5–15)
BUN: 35 mg/dL — ABNORMAL HIGH (ref 8–23)
CO2: 25 mmol/L (ref 22–32)
Calcium: 9.8 mg/dL (ref 8.9–10.3)
Chloride: 98 mmol/L (ref 98–111)
Creatinine, Ser: 2.15 mg/dL — ABNORMAL HIGH (ref 0.44–1.00)
GFR, Estimated: 23 mL/min — ABNORMAL LOW (ref >60)
Glucose, Bld: 439 mg/dL — ABNORMAL HIGH (ref 70–99)
Potassium: 3.6 mmol/L (ref 3.5–5.1)
Sodium: 135 mmol/L (ref 135–145)
Total Bilirubin: 0.6 mg/dL (ref 0.0–1.2)
Total Protein: 7.3 g/dL (ref 6.5–8.1)

## 2023-10-04 LAB — CBG MONITORING, ED: Glucose-Capillary: 397 mg/dL — ABNORMAL HIGH (ref 70–99)

## 2023-10-04 MED ORDER — INSULIN ASPART 100 UNIT/ML IV SOLN
4.0000 [IU] | Freq: Once | INTRAVENOUS | Status: AC
  Administered 2023-10-04: 4 [IU] via INTRAVENOUS

## 2023-10-04 MED ORDER — SODIUM CHLORIDE 0.9 % IV BOLUS
1000.0000 mL | Freq: Once | INTRAVENOUS | Status: AC
Start: 2023-10-04 — End: 2023-10-04
  Administered 2023-10-04: 1000 mL via INTRAVENOUS

## 2023-10-04 NOTE — Discharge Instructions (Signed)
 You were evaluated in the Emergency Department and after careful evaluation, we did not find any emergent condition requiring admission or further testing in the hospital.  Your exam/testing today is overall reassuring.  Very important that you follow-up closely with your primary care doctor to discuss your medications.  Keep a close eye on your blood sugars.  Please return to the Emergency Department if you experience any worsening of your condition.   Thank you for allowing [place] to be a part of your care.

## 2023-10-04 NOTE — ED Notes (Addendum)
 Daughter answered phone call and stated that she is working and her friend Martie Lee would be picking her up and would have a key. Attempted to call friend with no answer.

## 2023-10-04 NOTE — ED Notes (Signed)
 Pt states that her daughter will have to pick her up because she does not have a key to her apartment to be able to get in. Daughter called with no answer. Will attempt later in morning.

## 2023-10-11 ENCOUNTER — Encounter (HOSPITAL_COMMUNITY): Payer: Self-pay | Admitting: *Deleted

## 2023-10-11 ENCOUNTER — Emergency Department (HOSPITAL_COMMUNITY)

## 2023-10-11 ENCOUNTER — Inpatient Hospital Stay (HOSPITAL_COMMUNITY)
Admission: EM | Admit: 2023-10-11 | Discharge: 2023-10-13 | DRG: 563 | Disposition: A | Discharge status: Skilled Nursing Facility | Attending: Family Medicine | Admitting: Family Medicine

## 2023-10-11 ENCOUNTER — Other Ambulatory Visit: Payer: Self-pay

## 2023-10-11 DIAGNOSIS — Z880 Allergy status to penicillin: Secondary | ICD-10-CM | POA: Diagnosis not present

## 2023-10-11 DIAGNOSIS — D573 Sickle-cell trait: Secondary | ICD-10-CM | POA: Diagnosis present

## 2023-10-11 DIAGNOSIS — I130 Hypertensive heart and chronic kidney disease with heart failure and stage 1 through stage 4 chronic kidney disease, or unspecified chronic kidney disease: Secondary | ICD-10-CM | POA: Diagnosis present

## 2023-10-11 DIAGNOSIS — E785 Hyperlipidemia, unspecified: Secondary | ICD-10-CM | POA: Diagnosis not present

## 2023-10-11 DIAGNOSIS — I482 Chronic atrial fibrillation, unspecified: Secondary | ICD-10-CM | POA: Diagnosis not present

## 2023-10-11 DIAGNOSIS — Z9181 History of falling: Secondary | ICD-10-CM | POA: Diagnosis not present

## 2023-10-11 DIAGNOSIS — Z1152 Encounter for screening for COVID-19: Secondary | ICD-10-CM | POA: Diagnosis not present

## 2023-10-11 DIAGNOSIS — M79604 Pain in right leg: Secondary | ICD-10-CM | POA: Diagnosis not present

## 2023-10-11 DIAGNOSIS — Z741 Need for assistance with personal care: Secondary | ICD-10-CM | POA: Diagnosis not present

## 2023-10-11 DIAGNOSIS — R2681 Unsteadiness on feet: Secondary | ICD-10-CM | POA: Diagnosis not present

## 2023-10-11 DIAGNOSIS — I495 Sick sinus syndrome: Secondary | ICD-10-CM | POA: Diagnosis not present

## 2023-10-11 DIAGNOSIS — Z794 Long term (current) use of insulin: Secondary | ICD-10-CM | POA: Diagnosis not present

## 2023-10-11 DIAGNOSIS — M1712 Unilateral primary osteoarthritis, left knee: Secondary | ICD-10-CM | POA: Diagnosis present

## 2023-10-11 DIAGNOSIS — S82831A Other fracture of upper and lower end of right fibula, initial encounter for closed fracture: Secondary | ICD-10-CM | POA: Diagnosis not present

## 2023-10-11 DIAGNOSIS — I480 Paroxysmal atrial fibrillation: Secondary | ICD-10-CM | POA: Diagnosis present

## 2023-10-11 DIAGNOSIS — S199XXA Unspecified injury of neck, initial encounter: Secondary | ICD-10-CM | POA: Diagnosis not present

## 2023-10-11 DIAGNOSIS — S82222A Displaced transverse fracture of shaft of left tibia, initial encounter for closed fracture: Secondary | ICD-10-CM | POA: Diagnosis not present

## 2023-10-11 DIAGNOSIS — Z8673 Personal history of transient ischemic attack (TIA), and cerebral infarction without residual deficits: Secondary | ICD-10-CM | POA: Diagnosis not present

## 2023-10-11 DIAGNOSIS — I5032 Chronic diastolic (congestive) heart failure: Secondary | ICD-10-CM | POA: Diagnosis present

## 2023-10-11 DIAGNOSIS — Z955 Presence of coronary angioplasty implant and graft: Secondary | ICD-10-CM

## 2023-10-11 DIAGNOSIS — S82202A Unspecified fracture of shaft of left tibia, initial encounter for closed fracture: Secondary | ICD-10-CM | POA: Diagnosis not present

## 2023-10-11 DIAGNOSIS — Z8249 Family history of ischemic heart disease and other diseases of the circulatory system: Secondary | ICD-10-CM | POA: Diagnosis not present

## 2023-10-11 DIAGNOSIS — E782 Mixed hyperlipidemia: Secondary | ICD-10-CM | POA: Diagnosis present

## 2023-10-11 DIAGNOSIS — D72819 Decreased white blood cell count, unspecified: Secondary | ICD-10-CM | POA: Diagnosis not present

## 2023-10-11 DIAGNOSIS — G9341 Metabolic encephalopathy: Secondary | ICD-10-CM | POA: Diagnosis not present

## 2023-10-11 DIAGNOSIS — I672 Cerebral atherosclerosis: Secondary | ICD-10-CM | POA: Diagnosis not present

## 2023-10-11 DIAGNOSIS — R1310 Dysphagia, unspecified: Secondary | ICD-10-CM | POA: Diagnosis not present

## 2023-10-11 DIAGNOSIS — S8262XD Displaced fracture of lateral malleolus of left fibula, subsequent encounter for closed fracture with routine healing: Secondary | ICD-10-CM | POA: Diagnosis not present

## 2023-10-11 DIAGNOSIS — S82132D Displaced fracture of medial condyle of left tibia, subsequent encounter for closed fracture with routine healing: Secondary | ICD-10-CM | POA: Diagnosis not present

## 2023-10-11 DIAGNOSIS — W19XXXA Unspecified fall, initial encounter: Secondary | ICD-10-CM | POA: Diagnosis present

## 2023-10-11 DIAGNOSIS — N184 Chronic kidney disease, stage 4 (severe): Secondary | ICD-10-CM | POA: Diagnosis present

## 2023-10-11 DIAGNOSIS — N189 Chronic kidney disease, unspecified: Secondary | ICD-10-CM

## 2023-10-11 DIAGNOSIS — M47816 Spondylosis without myelopathy or radiculopathy, lumbar region: Secondary | ICD-10-CM | POA: Diagnosis not present

## 2023-10-11 DIAGNOSIS — S82832A Other fracture of upper and lower end of left fibula, initial encounter for closed fracture: Secondary | ICD-10-CM | POA: Diagnosis present

## 2023-10-11 DIAGNOSIS — R488 Other symbolic dysfunctions: Secondary | ICD-10-CM | POA: Diagnosis not present

## 2023-10-11 DIAGNOSIS — M6281 Muscle weakness (generalized): Secondary | ICD-10-CM | POA: Diagnosis not present

## 2023-10-11 DIAGNOSIS — R001 Bradycardia, unspecified: Secondary | ICD-10-CM | POA: Diagnosis present

## 2023-10-11 DIAGNOSIS — Y92512 Supermarket, store or market as the place of occurrence of the external cause: Secondary | ICD-10-CM | POA: Diagnosis not present

## 2023-10-11 DIAGNOSIS — I214 Non-ST elevation (NSTEMI) myocardial infarction: Secondary | ICD-10-CM | POA: Diagnosis not present

## 2023-10-11 DIAGNOSIS — E1159 Type 2 diabetes mellitus with other circulatory complications: Secondary | ICD-10-CM | POA: Diagnosis not present

## 2023-10-11 DIAGNOSIS — Z96651 Presence of right artificial knee joint: Secondary | ICD-10-CM | POA: Diagnosis not present

## 2023-10-11 DIAGNOSIS — E1169 Type 2 diabetes mellitus with other specified complication: Secondary | ICD-10-CM | POA: Diagnosis present

## 2023-10-11 DIAGNOSIS — E1122 Type 2 diabetes mellitus with diabetic chronic kidney disease: Secondary | ICD-10-CM | POA: Diagnosis not present

## 2023-10-11 DIAGNOSIS — Z7984 Long term (current) use of oral hypoglycemic drugs: Secondary | ICD-10-CM

## 2023-10-11 DIAGNOSIS — K219 Gastro-esophageal reflux disease without esophagitis: Secondary | ICD-10-CM | POA: Diagnosis not present

## 2023-10-11 DIAGNOSIS — I252 Old myocardial infarction: Secondary | ICD-10-CM | POA: Diagnosis not present

## 2023-10-11 DIAGNOSIS — S82192G Other fracture of upper end of left tibia, subsequent encounter for closed fracture with delayed healing: Secondary | ICD-10-CM | POA: Diagnosis not present

## 2023-10-11 DIAGNOSIS — Z7902 Long term (current) use of antithrombotics/antiplatelets: Secondary | ICD-10-CM

## 2023-10-11 DIAGNOSIS — E083491 Diabetes mellitus due to underlying condition with severe nonproliferative diabetic retinopathy without macular edema, right eye: Secondary | ICD-10-CM | POA: Diagnosis not present

## 2023-10-11 DIAGNOSIS — S82839A Other fracture of upper and lower end of unspecified fibula, initial encounter for closed fracture: Secondary | ICD-10-CM | POA: Insufficient documentation

## 2023-10-11 DIAGNOSIS — M159 Polyosteoarthritis, unspecified: Secondary | ICD-10-CM | POA: Diagnosis not present

## 2023-10-11 DIAGNOSIS — I10 Essential (primary) hypertension: Secondary | ICD-10-CM | POA: Diagnosis not present

## 2023-10-11 DIAGNOSIS — I6523 Occlusion and stenosis of bilateral carotid arteries: Secondary | ICD-10-CM | POA: Diagnosis not present

## 2023-10-11 DIAGNOSIS — Z7901 Long term (current) use of anticoagulants: Secondary | ICD-10-CM

## 2023-10-11 DIAGNOSIS — Z043 Encounter for examination and observation following other accident: Secondary | ICD-10-CM | POA: Diagnosis not present

## 2023-10-11 DIAGNOSIS — Z83438 Family history of other disorder of lipoprotein metabolism and other lipidemia: Secondary | ICD-10-CM

## 2023-10-11 DIAGNOSIS — N179 Acute kidney failure, unspecified: Secondary | ICD-10-CM | POA: Diagnosis not present

## 2023-10-11 DIAGNOSIS — S82102A Unspecified fracture of upper end of left tibia, initial encounter for closed fracture: Secondary | ICD-10-CM | POA: Diagnosis present

## 2023-10-11 DIAGNOSIS — R55 Syncope and collapse: Secondary | ICD-10-CM | POA: Diagnosis not present

## 2023-10-11 DIAGNOSIS — S82402A Unspecified fracture of shaft of left fibula, initial encounter for closed fracture: Secondary | ICD-10-CM | POA: Diagnosis not present

## 2023-10-11 DIAGNOSIS — I152 Hypertension secondary to endocrine disorders: Secondary | ICD-10-CM | POA: Diagnosis not present

## 2023-10-11 DIAGNOSIS — I2510 Atherosclerotic heart disease of native coronary artery without angina pectoris: Secondary | ICD-10-CM | POA: Diagnosis present

## 2023-10-11 DIAGNOSIS — Z961 Presence of intraocular lens: Secondary | ICD-10-CM | POA: Diagnosis present

## 2023-10-11 DIAGNOSIS — Z79899 Other long term (current) drug therapy: Secondary | ICD-10-CM

## 2023-10-11 DIAGNOSIS — E083411 Diabetes mellitus due to underlying condition with severe nonproliferative diabetic retinopathy with macular edema, right eye: Secondary | ICD-10-CM | POA: Diagnosis not present

## 2023-10-11 DIAGNOSIS — I5022 Chronic systolic (congestive) heart failure: Secondary | ICD-10-CM | POA: Diagnosis not present

## 2023-10-11 DIAGNOSIS — S0990XA Unspecified injury of head, initial encounter: Secondary | ICD-10-CM | POA: Diagnosis not present

## 2023-10-11 DIAGNOSIS — E1165 Type 2 diabetes mellitus with hyperglycemia: Secondary | ICD-10-CM | POA: Diagnosis not present

## 2023-10-11 DIAGNOSIS — Z9841 Cataract extraction status, right eye: Secondary | ICD-10-CM

## 2023-10-11 DIAGNOSIS — D72818 Other decreased white blood cell count: Secondary | ICD-10-CM | POA: Diagnosis not present

## 2023-10-11 DIAGNOSIS — S82192A Other fracture of upper end of left tibia, initial encounter for closed fracture: Secondary | ICD-10-CM | POA: Diagnosis not present

## 2023-10-11 DIAGNOSIS — Z833 Family history of diabetes mellitus: Secondary | ICD-10-CM

## 2023-10-11 DIAGNOSIS — Z9842 Cataract extraction status, left eye: Secondary | ICD-10-CM

## 2023-10-11 LAB — CBC WITH DIFFERENTIAL/PLATELET
Abs Immature Granulocytes: 0.01 10*3/uL (ref 0.00–0.07)
Basophils Absolute: 0.1 10*3/uL (ref 0.0–0.1)
Basophils Relative: 1 %
Eosinophils Absolute: 0.1 10*3/uL (ref 0.0–0.5)
Eosinophils Relative: 4 %
HCT: 37.7 % (ref 36.0–46.0)
Hemoglobin: 13.3 g/dL (ref 12.0–15.0)
Immature Granulocytes: 0 %
Lymphocytes Relative: 33 %
Lymphs Abs: 1.3 10*3/uL (ref 0.7–4.0)
MCH: 28.5 pg (ref 26.0–34.0)
MCHC: 35.3 g/dL (ref 30.0–36.0)
MCV: 80.9 fL (ref 80.0–100.0)
Monocytes Absolute: 0.4 10*3/uL (ref 0.1–1.0)
Monocytes Relative: 11 %
Neutro Abs: 2 10*3/uL (ref 1.7–7.7)
Neutrophils Relative %: 51 %
Platelets: 190 10*3/uL (ref 150–400)
RBC: 4.66 MIL/uL (ref 3.87–5.11)
RDW: 15.3 % (ref 11.5–15.5)
WBC: 3.9 10*3/uL — ABNORMAL LOW (ref 4.0–10.5)
nRBC: 0 % (ref 0.0–0.2)

## 2023-10-11 LAB — GLUCOSE, CAPILLARY
Glucose-Capillary: 164 mg/dL — ABNORMAL HIGH (ref 70–99)
Glucose-Capillary: 278 mg/dL — ABNORMAL HIGH (ref 70–99)
Glucose-Capillary: 189 mg/dL — ABNORMAL HIGH (ref 70–99)

## 2023-10-11 LAB — BASIC METABOLIC PANEL WITH GFR
Anion gap: 9 (ref 5–15)
BUN: 31 mg/dL — ABNORMAL HIGH (ref 8–23)
CO2: 27 mmol/L (ref 22–32)
Calcium: 9.5 mg/dL (ref 8.9–10.3)
Chloride: 102 mmol/L (ref 98–111)
Creatinine, Ser: 1.99 mg/dL — ABNORMAL HIGH (ref 0.44–1.00)
GFR, Estimated: 25 mL/min — ABNORMAL LOW (ref >60)
Glucose, Bld: 75 mg/dL (ref 70–99)
Potassium: 3.6 mmol/L (ref 3.5–5.1)
Sodium: 138 mmol/L (ref 135–145)

## 2023-10-11 MED ORDER — ACETAMINOPHEN 500 MG PO TABS
1000.0000 mg | ORAL_TABLET | Freq: Once | ORAL | Status: AC
  Administered 2023-10-11: 1000 mg via ORAL
  Filled 2023-10-11: qty 2

## 2023-10-11 NOTE — ED Provider Notes (Signed)
 [place] EMERGENCY DEPARTMENT AT [facility] Provider Note   CSN: [id number] Arrival date & time: 10/11/23  1817     History  Chief Complaint  Patient presents with   [place]    LENORE MOYANO is a 78 y.o. female with PMH as listed below who presents with fall in the [employer] parking lot while attempting to get into her car.  She reports she is normally weak in the left leg and that gave out on her and she fell backwards and struck the back of her head.  She did not lose consciousness.  Endorses pain in the left hip and down the left leg.  Endorses numbness tingling to the bottom of her left foot which is not normal for her.  Denies any neck or back pain.  Otherwise had been in her normal state of health.  Placed in c-collar by EMS.. Takes eliquis .    Past Medical History:  Diagnosis Date   (HFpEF) heart failure with preserved ejection fraction (HCC)    Limited Echo 7/22: Mild asymmetrical LVH due to scar and thinning of basal posterior wall and background conc LVH, apical and mid segments hyperdynamic, EF 60-65, basal inf-lat and basal inf AK, normal RVSF, RVSP 26.6, mild LAE, mild-moderate MR, mild AV sclerosis w/o AS // Echo 5/22: EF 55-60, RVSP 27, mild to mod LAE, poss small to mod eff; AV sclerosis no AS   Arthritis    "all over"   CAD (coronary artery disease)    a. NSTEMI 12/2013 - occluded dominant RCA with L-R collaterals, moderate prox segmental LAD disease, for medical therapy initially, EF 60% with subtle inferobasal hypokinesia. Consider PCI for refractory CP.   CKD (chronic kidney disease), stage III (HCC)    Hypertension    Migraines    "weekly" (01/06/2014)   Sickle cell trait (HCC)    TIA (transient ischemic attack) 1978   Type II diabetes mellitus (HCC)    Vertigo        Home Medications Prior to Admission medications   Medication Sig Start Date End Date Taking? Authorizing Provider  amLODipine  (NORVASC ) 10 MG tablet Take 1 tablet (10 mg total)  by mouth daily. 09/19/23   Mason Sole, Pratik D, DO  atorvastatin  (LIPITOR ) 80 MG tablet Take 1 tablet (80 mg total) by mouth every evening. 12/21/20   Hilty, Aviva Lemmings, MD  carvedilol  (COREG ) 3.125 MG tablet Take 1 tablet (3.125 mg total) by mouth 2 (two) times daily with a meal. 10/18/22   Hilty, Aviva Lemmings, MD  cefdinir  (OMNICEF ) 300 MG capsule Take 1 capsule (300 mg total) by mouth 2 (two) times daily. 09/29/23   Demaris Fillers, MD  clopidogrel  (PLAVIX ) 75 MG tablet Take 1 tablet (75 mg total) by mouth daily. 09/17/21   Demaris Fillers, MD  doxycycline  (VIBRA -TABS) 100 MG tablet Take 1 tablet (100 mg total) by mouth 2 (two) times daily. 09/29/23   Demaris Fillers, MD  ELIQUIS  5 MG TABS tablet TAKE (1) TABLET BY MOUTH TWICE DAILY. 05/24/22   Hilty, Aviva Lemmings, MD  empagliflozin  (JARDIANCE ) 10 MG TABS tablet Take 1 tablet (10 mg total) by mouth daily. 12/04/22   Shamleffer, Ibtehal Jaralla, MD  furosemide  (LASIX ) 40 MG tablet Take 1.5 tablets (60 mg total) by mouth daily. 01/05/23 12/31/23  Tania Familia, NP  insulin  aspart (NOVOLOG  FLEXPEN) 100 UNIT/ML FlexPen Max daily 30 units Patient taking differently: 6 Units 3 (three) times daily with meals. 12/04/22   Shamleffer, Ibtehal Jaralla, MD  Insulin  Glargine (BASAGLAR  KWIKPEN) 100 UNIT/ML Inject 22 Units into the skin daily. 09/29/23   Demaris Fillers, MD  isosorbide  mononitrate (IMDUR ) 60 MG 24 hr tablet Take 60 mg by mouth daily.    [provider]  methocarbamol  (ROBAXIN ) 500 MG tablet Take 500 mg by mouth every 8 (eight) hours as needed for muscle spasms. 09/13/23   [provider]  nitroGLYCERIN  (NITROSTAT ) 0.4 MG SL tablet PLACE 1 TABLET UNDER THE TONGUE EVERY 5 MINUTES AS NEEDED FOR CHEST PAIN (UP TO 3 DOSES) 01/02/22   Hilty, Aviva Lemmings, MD  polyethylene glycol (MIRALAX  / GLYCOLAX ) 17 g packet Take 17 g by mouth daily. Patient taking differently: Take 17 g by mouth daily as needed for moderate constipation. 11/12/20   Daren Eck, DO  sacubitril -valsartan   (ENTRESTO ) 49-51 MG TAKE (1) TABLET BY MOUTH TWICE DAILY. 05/17/23   Tania Familia, NP      Allergies    Penicillins    Review of Systems   Review of Systems A 10 point review of systems was performed and is negative unless otherwise reported in HPI.  Physical Exam Updated Vital Signs BP (!) 164/63 (BP Location: Right Arm)   Pulse (!) 56   Temp 97.6 F (36.4 C) (Oral)   Resp 14   Ht 5\' 6"  (1.676 m)   Wt 74.8 kg   SpO2 98%   BMI 26.63 kg/m  Physical Exam  PRIMARY SURVEY  Airway Airway intact  Breathing Bilateral breath sounds  Circulation Carotid/femoral pulses 2+ intact bilaterally  GCS E =  4 V =  5 M =  6 Total = 15  Environment All clothes removed      SECONDARY SURVEY  Gen: -NAD  HEENT: -Head: NCAT. Scalp is clear of lacerations or wounds. Skull is clear of deformities or depressions -Forehead: Normal -Midface: Stable -Eyes: No visible injury to eyelids or eye, PERRL, EOMI -Nose: No gross deformities -Mouth: No injuries to lips, tongue or teeth. No trismus or malposition -Ears: No auricular hematoma -Neck: Trachea is midline, no distended neck veins  Chest: -No tenderness, deformities, bruising or crepitus to clavicles or chest -Normal chest expansion -Normal heart sounds, S1/S2 normal, no m/r/g -No wheezes, rales, rhonchi  Abdomen: -No tenderness, bruising or penetrating injury  Pelvis: -Pelvis is stable and non-tender  Extremities: Right Upper Extremity: -No point tenderness, deformity or other signs of injury -Radial pulse intact RUE, cap refill good -Normal sensation -Normal ROM, good strength Left Upper Extremity: -No point tenderness, deformity or other signs of injury -Radial pulse intact LUE, cap refill good -Normal sensation -Normal ROM, good strength Right Lower Extremity: -No point tenderness, deformity or other signs of injury -DP intact RLE -Normal sensation -Normal ROM, good strength Left Lower Extremity: -TTP in distal femur  and left knee with mild effusion. Mild swelling to LLE.  -No foreshortened or externally rotated LLE -DP intact LLE -Normal sensation -Normal ROM, good strength  Back/Spine: -No midline C, T or L spine tenderness or step-offs -Collar: EMS collar in place  Other: N/A     ED Results / Procedures / Treatments   Labs (all labs ordered are listed, but only abnormal results are displayed) Labs Reviewed  CBC WITH DIFFERENTIAL/PLATELET - Abnormal; Notable for the following components:      Result Value   WBC 3.9 (*)    All other components within normal limits  BASIC METABOLIC PANEL WITH GFR - Abnormal; Notable for the following components:   BUN 31 (*)  Creatinine, Ser 1.99 (*)    GFR, Estimated 25 (*)    All other components within normal limits  GLUCOSE, CAPILLARY - Abnormal; Notable for the following components:   Glucose-Capillary 135 (*)    All other components within normal limits  COMPREHENSIVE METABOLIC PANEL WITH GFR - Abnormal; Notable for the following components:   Potassium 3.2 (*)    Glucose, Bld 136 (*)    BUN 29 (*)    Creatinine, Ser 1.70 (*)    Calcium  8.8 (*)    Total Protein 5.9 (*)    Albumin 3.0 (*)    GFR, Estimated 30 (*)    All other components within normal limits  CBC - Abnormal; Notable for the following components:   Hemoglobin 10.9 (*)    HCT 31.9 (*)    All other components within normal limits    EKG None  Radiology DG HIP UNILAT WITH PELVIS 2-3 VIEWS LEFT Result Date: 10/11/2023 CLINICAL DATA:  190176 Fall 190176 EXAM: DG HIP (WITH OR WITHOUT PELVIS) 2-3V LEFT COMPARISON:  None Available. FINDINGS: There is no evidence of hip fracture or dislocation of the left hip. No acute displaced fracture or dislocation of the right hip. There is no evidence of arthropathy or other focal bone abnormality. Vascular calcifications. Degenerative changes visualized lower lumbar spine. Coarsely calcified lesions overlying the pelvis consistent with  degenerative uterine fibroids. IMPRESSION: Negative for acute traumatic injury. Electronically Signed   By: Morgane  Naveau M.D.   On: 10/11/2023 20:42   CT Head Wo Contrast Result Date: 10/11/2023 CLINICAL DATA:  Head trauma, moderate-severe; Neck trauma (Age >= 65y) EXAM: CT HEAD WITHOUT CONTRAST CT CERVICAL SPINE WITHOUT CONTRAST TECHNIQUE: Multidetector CT imaging of the head and cervical spine was performed following the standard protocol without intravenous contrast. Multiplanar CT image reconstructions of the cervical spine were also generated. RADIATION DOSE REDUCTION: This exam was performed according to the departmental dose-optimization program which includes automated exposure control, adjustment of the mA and/or kV according to patient size and/or use of iterative reconstruction technique. COMPARISON:  CT head and C-spine 09/28/2023 FINDINGS: CT HEAD FINDINGS Brain: No evidence of large-territorial acute infarction. No parenchymal hemorrhage. No mass lesion. No extra-axial collection. No mass effect or midline shift. No hydrocephalus. Basilar cisterns are patent. Vascular: No hyperdense vessel. Atherosclerotic calcifications are present within the cavernous internal carotid and vertebral arteries. Skull: No acute fracture or focal lesion. Sinuses/Orbits: Right maxillary sinus mucosal thickening with associated calcifications. Paranasal sinuses and mastoid air cells are clear. Bilateral lens replacement. Otherwise the orbits are unremarkable. Other: None. CT CERVICAL SPINE FINDINGS Alignment: Stable mild retrolisthesis of C2 on C3. Stable reversal of normal cervical lordosis at the C5-C6 level due to degenerative changes. Skull base and vertebrae: Multilevel severe degenerative changes of the spine. Partial osseous fusion at the C5-C6 level. No associated severe osseous neural foraminal or central canal stenosis. Posterior disc osteophyte complex formation at the C6-C7 level. No acute fracture. No  aggressive appearing focal osseous lesion or focal pathologic process. Soft tissues and spinal canal: No prevertebral fluid or swelling. No visible canal hematoma. Upper chest: Unremarkable. Other: Atherosclerotic plaque of the carotid arteries within the neck. IMPRESSION: 1. No acute intracranial abnormality. 2. No acute displaced fracture or traumatic listhesis of the cervical spine. Electronically Signed   By: Morgane  Naveau M.D.   On: 10/11/2023 20:40   CT Cervical Spine Wo Contrast Result Date: 10/11/2023 CLINICAL DATA:  Head trauma, moderate-severe; Neck trauma (Age >= 65y) EXAM: CT  HEAD WITHOUT CONTRAST CT CERVICAL SPINE WITHOUT CONTRAST TECHNIQUE: Multidetector CT imaging of the head and cervical spine was performed following the standard protocol without intravenous contrast. Multiplanar CT image reconstructions of the cervical spine were also generated. RADIATION DOSE REDUCTION: This exam was performed according to the departmental dose-optimization program which includes automated exposure control, adjustment of the mA and/or kV according to patient size and/or use of iterative reconstruction technique. COMPARISON:  CT head and C-spine 09/28/2023 FINDINGS: CT HEAD FINDINGS Brain: No evidence of large-territorial acute infarction. No parenchymal hemorrhage. No mass lesion. No extra-axial collection. No mass effect or midline shift. No hydrocephalus. Basilar cisterns are patent. Vascular: No hyperdense vessel. Atherosclerotic calcifications are present within the cavernous internal carotid and vertebral arteries. Skull: No acute fracture or focal lesion. Sinuses/Orbits: Right maxillary sinus mucosal thickening with associated calcifications. Paranasal sinuses and mastoid air cells are clear. Bilateral lens replacement. Otherwise the orbits are unremarkable. Other: None. CT CERVICAL SPINE FINDINGS Alignment: Stable mild retrolisthesis of C2 on C3. Stable reversal of normal cervical lordosis at the C5-C6  level due to degenerative changes. Skull base and vertebrae: Multilevel severe degenerative changes of the spine. Partial osseous fusion at the C5-C6 level. No associated severe osseous neural foraminal or central canal stenosis. Posterior disc osteophyte complex formation at the C6-C7 level. No acute fracture. No aggressive appearing focal osseous lesion or focal pathologic process. Soft tissues and spinal canal: No prevertebral fluid or swelling. No visible canal hematoma. Upper chest: Unremarkable. Other: Atherosclerotic plaque of the carotid arteries within the neck. IMPRESSION: 1. No acute intracranial abnormality. 2. No acute displaced fracture or traumatic listhesis of the cervical spine. Electronically Signed   By: Morgane  Naveau M.D.   On: 10/11/2023 20:40    Procedures Procedures    Medications Ordered in ED Medications  acetaminophen  (TYLENOL ) tablet 1,000 mg (1,000 mg Oral Given 10/11/23 2113)    ED Course/ Medical Decision Making/ A&P                          Medical Decision Making Amount and/or Complexity of Data Reviewed Labs: ordered. Radiology: ordered. Decision-making details documented in ED Course.  Risk OTC drugs. Decision regarding hospitalization.    This patient presents to the ED for concern of fall with head strike, LLE pain, this involves an extensive number of treatment options, and is a complaint that carries with it a high risk of complications and morbidity.  I considered the following differential and admission for this acute, potentially life threatening condition.   MDM:    DDX for trauma includes but is not limited to:  -Head Injury such as skull fx or ICH; Spinal Cord or Vertebral injury - reassuringly neg CTH/CT c-spine. C-spine cleared clinically as well.  -Fractures - noted to have tibial/fibular fractures proximally, closed. Will c/w ortho.  -Patient is noted to be bradycardic in the 50s on arrival to ED, with no CP/lightheadedness. She has  bradycardia on several other notes in the chart. Has h/o SSS and afib, takes coreg    Clinical Course as of 10/17/23 1033  Thu Oct 11, 2023  2045 DG HIP UNILAT WITH PELVIS 2-3 VIEWS LEFT Negative for acute traumatic injury. [HN]  2045 CT Head Wo Contrast 1. No acute intracranial abnormality. 2. No acute displaced fracture or traumatic listhesis of the cervical spine.   [HN]  2214 DG Knee 2 Views Left 1. Acute displaced proximal tibial metadiaphysis fracture. 2. Acute displaced fibular head/neck fracture.  3. Knee effusion. 4. Severe tricompartmental degenerative changes of the knee. 5. No acute displaced fracture or dislocation of the left hip. 6. No acute displaced fracture or dislocation of the distal left fibula and tibia. Left ankle appears grossly unremarkable.   [HN]  2221 Ordered posterior long leg splint and consulted to ortho [HN]  2338 D/w Dr. Phyllis Breeze who recommends knee immobilizer, and if patient isn't ambulatory with crutches, will admit for PT consult in AM. Patient is not going to be ambulatory on crutches, will admit to Dr. Adefeso for PT consult in AM.  [HN]    Clinical Course User Index [HN] Merdis Stalling, MD   Imaging Studies ordered: I ordered imaging studies including CTH, CT C-spine, Hip XR, LLE XRs I independently visualized and interpreted imaging. I agree with the radiologist interpretation  Additional history obtained from chart review.    Cardiac Monitoring: The patient was maintained on a cardiac monitor.  I personally viewed and interpreted the cardiac monitored which showed an underlying rhythm of: sinus bradycardia  Reevaluation: After the interventions noted above, I reevaluated the patient and found that they have :improved  Social Determinants of Health: Lives independently  Disposition:  Admit to medicine  Co morbidities that complicate the patient evaluation  Past Medical History:  Diagnosis Date   (HFpEF) heart failure with  preserved ejection fraction (HCC)    Limited Echo 7/22: Mild asymmetrical LVH due to scar and thinning of basal posterior wall and background conc LVH, apical and mid segments hyperdynamic, EF 60-65, basal inf-lat and basal inf AK, normal RVSF, RVSP 26.6, mild LAE, mild-moderate MR, mild AV sclerosis w/o AS // Echo 5/22: EF 55-60, RVSP 27, mild to mod LAE, poss small to mod eff; AV sclerosis no AS   Arthritis    "all over"   CAD (coronary artery disease)    a. NSTEMI 12/2013 - occluded dominant RCA with L-R collaterals, moderate prox segmental LAD disease, for medical therapy initially, EF 60% with subtle inferobasal hypokinesia. Consider PCI for refractory CP.   CKD (chronic kidney disease), stage III (HCC)    Hypertension    Migraines    "weekly" (01/06/2014)   Sickle cell trait (HCC)    TIA (transient ischemic attack) 1978   Type II diabetes mellitus (HCC)    Vertigo      Medicines Meds ordered this encounter  Medications   acetaminophen  (TYLENOL ) tablet 1,000 mg    I have reviewed the patients home medicines and have made adjustments as needed  Problem List / ED Course: Problem List Items Addressed This Visit   None Visit Diagnoses       Closed displaced transverse fracture of shaft of left tibia, initial encounter    -  Primary     Other closed fracture of proximal end of left fibula, initial encounter         Fall in home, initial encounter                       This note was created using dictation software, which may contain spelling or grammatical errors.    Merdis Stalling, MD 10/17/23 [id number]

## 2023-10-11 NOTE — ED Notes (Signed)
 C-collar removed from patient by MD after Ct scans showed negative imaging

## 2023-10-11 NOTE — ED Triage Notes (Addendum)
 Pt brought in by [facility] from the parking lot at [place]. Pt was attempting to get into her car and she reports her "leg gave out" and she fell backwards hitting her head on the asphalt. Pt is on Eliquis. Pt c/o pain to the back of her head. Denies LOC. C-collar in place by [facility]. Pt c/o pain to left hip and down the leg. EMS reports shortening of leg left. EMS vitals - BP: 176/88, HR: 56, O2 sat: 99% on RA.

## 2023-10-11 NOTE — H&P (Addendum)
 History and Physical    Patient: Cassandra Adams NWG:[id number] DOB: [birth year] DOA: 10/11/2023 DOS: the patient was seen and examined on 10/12/2023 PCP: Minus Amel, MD  Patient coming from: Home  Chief Complaint:  Chief Complaint  Patient presents with   Fall   HPI: Cassandra Adams is a 77 y.o. female with medical history significant of CAD/NSTEMI (Feb 2023), paroxysmal atrial fibrillation, CKD stage III, diabetes mellitus type 2, hypertension, hyperlipidemia, and sick sinus syndrome who presents to the emergency department due to a fall.  Patient had a fall at [place] parking lot while trying to get into her car.  She lost balance due to chronically weakness in the left leg and fell backwards hitting the back of her head without losing consciousness.  She [place] to complain of left hip and left leg pain associated with numbness and tingling at the bottom of left foot.  She denies chest pain, neck pain, back pain.  EMS was activated, c-collar was placed  ED Course:  In the emergency department, BP was 164/63, pulse 56 bpm, other vital signs were within normal range.  Workup in the ED showed normal CBC except for WBC of 3.9, BMP was normal except for BUN/creatinine 31/1.99 (baseline creatinine 1.6/1.7). Left tibia and fibula x-ray showed acute displaced proximal tibia metadiaphysis fracture.  Acute displaced fibular head/neck fracture.  No acute displaced fracture or dislocation of the left hip. CT cervical spine without contrast showed no acute displaced fracture or traumatic listhesis of the cervical spine CT of head without contrast showed no acute intracranial normality Orthopedic surgery ( Dr. Phyllis Breeze) was consulted and will see patient in the morning per EDP Tylenol  was given for pain.  Hospitalist was asked to admit patient for further evaluation and management.  Review of Systems: Review of systems as noted in the HPI. All other systems reviewed and are  negative.   Past Medical History:  Diagnosis Date   (HFpEF) heart failure with preserved ejection fraction (HCC)    Limited Echo 7/22: Mild asymmetrical LVH due to scar and thinning of basal posterior wall and background conc LVH, apical and mid segments hyperdynamic, EF 60-65, basal inf-lat and basal inf AK, normal RVSF, RVSP 26.6, mild LAE, mild-moderate MR, mild AV sclerosis w/o AS // Echo 5/22: EF 55-60, RVSP 27, mild to mod LAE, poss small to mod eff; AV sclerosis no AS   Arthritis    "all over"   CAD (coronary artery disease)    a. NSTEMI 12/2013 - occluded dominant RCA with L-R collaterals, moderate prox segmental LAD disease, for medical therapy initially, EF 60% with subtle inferobasal hypokinesia. Consider PCI for refractory CP.   CKD (chronic kidney disease), stage III (HCC)    Hypertension    Migraines    "weekly" (01/06/2014)   Sickle cell trait (HCC)    TIA (transient ischemic attack) 1978   Type II diabetes mellitus (HCC)    Vertigo    Past Surgical History:  Procedure Laterality Date   APPENDECTOMY  March 2014   had appendix frozen   CARDIAC CATHETERIZATION  "years ago" & 01/05/2014   CARDIOVERSION N/A 03/17/2014   Procedure: CARDIOVERSION;  Surgeon: Hazle Lites, MD;  Location: [facility] ENDOSCOPY;  Service: Cardiovascular;  Laterality: N/A;   CARDIOVERSION N/A 01/14/2021   Procedure: CARDIOVERSION;  Surgeon: Hazle Lites, MD;  Location: [facility] ENDOSCOPY;  Service: Cardiovascular;  Laterality: N/A;   CARDIOVERSION N/A 12/13/2021   Procedure: CARDIOVERSION;  Surgeon: Lake Pilgrim, MD;  Location:  [facility] ENDOSCOPY;  Service: Cardiovascular;  Laterality: N/A;   CATARACT EXTRACTION W/ INTRAOCULAR LENS  IMPLANT, BILATERAL Bilateral 04/2013-05/2013   CORONARY ATHERECTOMY N/A 08/05/2021   Procedure: CORONARY ATHERECTOMY;  Surgeon: Arnoldo Lapping, MD;  Location: [facility] INVASIVE CV LAB;  Service: Cardiovascular;  Laterality: N/A;   CORONARY PRESSURE/FFR STUDY N/A 08/04/2021   Procedure:  INTRAVASCULAR PRESSURE WIRE/FFR STUDY;  Surgeon: Sammy Crisp, MD;  Location: [facility] INVASIVE CV LAB;  Service: Cardiovascular;  Laterality: N/A;   CORONARY STENT INTERVENTION N/A 08/05/2021   Procedure: CORONARY STENT INTERVENTION;  Surgeon: Arnoldo Lapping, MD;  Location: [facility] INVASIVE CV LAB;  Service: Cardiovascular;  Laterality: N/A;   ENDARTERECTOMY Right 02/05/2017   Procedure: ENDARTERECTOMY CAROTID-RIGHT;  Surgeon: Richrd Char, MD;  Location: [facility] OR;  Service: Vascular;  Laterality: Right;   ESOPHAGOGASTRODUODENOSCOPY  11/08/2007    Normal esophagus without evidence of Barrett, mass, erosion/ Normal stomach, duodenal bulb   ESOPHAGOGASTRODUODENOSCOPY (EGD) WITH PROPOFOL  N/A 11/08/2020   Procedure: ESOPHAGOGASTRODUODENOSCOPY (EGD) WITH PROPOFOL ;  Surgeon: Lindle Rhea, MD;  Location: [facility] ENDOSCOPY;  Service: Gastroenterology;  Laterality: N/A;   GASTRIC MOTILITY STUDY  11/13/2007   mildly delayed emptying subjectively, but normal  analysis-77% of tracer emptied at 2 hours   JOINT REPLACEMENT     LAPAROSCOPIC APPENDECTOMY N/A 09/15/2012   Procedure: APPENDECTOMY LAPAROSCOPIC;  Surgeon: Beau Bound, MD;  Location: [facility] ORS;  Service: General;  Laterality: N/A;   LEFT HEART CATH AND CORONARY ANGIOGRAPHY N/A 08/04/2021   Procedure: LEFT HEART CATH AND CORONARY ANGIOGRAPHY;  Surgeon: Sammy Crisp, MD;  Location: [facility] INVASIVE CV LAB;  Service: Cardiovascular;  Laterality: N/A;   LEFT HEART CATHETERIZATION WITH CORONARY ANGIOGRAM N/A 01/05/2014   Procedure: LEFT HEART CATHETERIZATION WITH CORONARY ANGIOGRAM;  Surgeon: Avanell Leigh, MD;  Location: [facility] CATH LAB;  Service: Cardiovascular;  Laterality: N/A;   REPLACEMENT TOTAL KNEE Right 05-03-06   TEE WITHOUT CARDIOVERSION N/A 03/17/2014   Procedure: TRANSESOPHAGEAL ECHOCARDIOGRAM (TEE);  Surgeon: Hazle Lites, MD;  Location: [facility] ENDOSCOPY;  Service: Cardiovascular;  Laterality: N/A;   TONSILLECTOMY  1972    Social History:  reports  that she has never smoked. She has never used smokeless tobacco. She reports current alcohol use. She reports that she does not use drugs.   Allergies  Allergen Reactions   Penicillins Hives    Family History  Problem Relation Age of Onset   Hyperlipidemia Mother    Hypertension Mother    Heart attack Mother    Cancer Father        lung   Hypertension Sister    Diabetes Brother    Heart disease Brother    Hyperlipidemia Brother    Hypertension Brother    Heart attack Brother    Other Brother        DVT     Prior to Admission medications   Medication Sig Start Date End Date Taking? Authorizing Provider  amLODipine  (NORVASC ) 10 MG tablet Take 1 tablet (10 mg total) by mouth daily. 09/19/23   Mason Sole, Pratik D, DO  atorvastatin  (LIPITOR ) 80 MG tablet Take 1 tablet (80 mg total) by mouth every evening. 12/21/20   Hilty, Aviva Lemmings, MD  carvedilol  (COREG ) 3.125 MG tablet Take 1 tablet (3.125 mg total) by mouth 2 (two) times daily with a meal. 10/18/22   Hilty, Aviva Lemmings, MD  cefdinir  (OMNICEF ) 300 MG capsule Take 1 capsule (300 mg total) by mouth 2 (two) times daily. 09/29/23   Demaris Fillers, MD  clopidogrel  (PLAVIX ) 75  MG tablet Take 1 tablet (75 mg total) by mouth daily. 09/17/21   Demaris Fillers, MD  doxycycline  (VIBRA -TABS) 100 MG tablet Take 1 tablet (100 mg total) by mouth 2 (two) times daily. 09/29/23   Demaris Fillers, MD  ELIQUIS  5 MG TABS tablet TAKE (1) TABLET BY MOUTH TWICE DAILY. 05/24/22   Hilty, Aviva Lemmings, MD  empagliflozin  (JARDIANCE ) 10 MG TABS tablet Take 1 tablet (10 mg total) by mouth daily. 12/04/22   Shamleffer, Ibtehal Jaralla, MD  furosemide  (LASIX ) 40 MG tablet Take 1.5 tablets (60 mg total) by mouth daily. 01/05/23 12/31/23  Tania Familia, NP  insulin  aspart (NOVOLOG  FLEXPEN) 100 UNIT/ML FlexPen Max daily 30 units Patient taking differently: 6 Units 3 (three) times daily with meals. 12/04/22   Shamleffer, Ibtehal Jaralla, MD  Insulin  Glargine (BASAGLAR  KWIKPEN) 100 UNIT/ML Inject  22 Units into the skin daily. 09/29/23   Demaris Fillers, MD  isosorbide  mononitrate (IMDUR ) 60 MG 24 hr tablet Take 60 mg by mouth daily.    [provider]  methocarbamol  (ROBAXIN ) 500 MG tablet Take 500 mg by mouth every 8 (eight) hours as needed for muscle spasms. 09/13/23   [provider]  nitroGLYCERIN  (NITROSTAT ) 0.4 MG SL tablet PLACE 1 TABLET UNDER THE TONGUE EVERY 5 MINUTES AS NEEDED FOR CHEST PAIN (UP TO 3 DOSES) 01/02/22   Hilty, Aviva Lemmings, MD  polyethylene glycol (MIRALAX  / GLYCOLAX ) 17 g packet Take 17 g by mouth daily. Patient taking differently: Take 17 g by mouth daily as needed for moderate constipation. 11/12/20   Daren Eck, DO  sacubitril -valsartan  (ENTRESTO ) 49-51 MG TAKE (1) TABLET BY MOUTH TWICE DAILY. 05/17/23   Tania Familia, NP    Physical Exam: BP (!) 158/57   Pulse (!) 57   Temp 97.6 F (36.4 C) (Oral)   Resp 16   Ht 5\' 6"  (1.676 m)   Wt 74.8 kg   SpO2 95%   BMI 26.63 kg/m   General: 78 y.o. year-old female well developed well nourished in no acute distress.  Alert and oriented x3. HEENT: NCAT, EOMI Neck: Supple, trachea medial Cardiovascular: Regular rate and rhythm with no rubs or gallops.  No thyromegaly or JVD noted.  No lower extremity edema. 2/4 pulses in all 4 extremities. Respiratory: Clear to auscultation with no wheezes or rales. Good inspiratory effort. Abdomen: Soft, nontender nondistended with normal bowel sounds x4 quadrants. Muskuloskeletal: Left knee with mild effusion, TTP in left distal femur. Left knee brace noted. No cyanosis, clubbing noted bilaterally Neuro: CN II-XII intact, strength 5/5 x 4, sensation, reflexes intact Skin: No ulcerative lesions noted or rashes Psychiatry: Judgement and insight appear normal. Mood is appropriate for condition and setting          Labs on Admission:  Basic Metabolic Panel: Recent Labs  Lab 10/11/23 1836  NA 138  K 3.6  CL 102  CO2 27  GLUCOSE 75  BUN 31*  CREATININE  1.99*  CALCIUM  9.5   Liver Function Tests: No results for input(s): "AST", "ALT", "ALKPHOS", "BILITOT", "PROT", "ALBUMIN" in the last 168 hours. No results for input(s): "LIPASE", "AMYLASE" in the last 168 hours. No results for input(s): "AMMONIA" in the last 168 hours. CBC: Recent Labs  Lab 10/11/23 1836  WBC 3.9*  NEUTROABS 2.0  HGB 13.3  HCT 37.7  MCV 80.9  PLT 190   Cardiac Enzymes: No results for input(s): "CKTOTAL", "CKMB", "CKMBINDEX", "TROPONINI" in the last 168 hours.  BNP (last 3 results) Recent Labs  09/17/23 1627  BNP 657.0*    ProBNP (last 3 results) No results for input(s): "PROBNP" in the last 8760 hours.  CBG: Recent Labs  Lab 10/12/23 0132  GLUCAP 135*    Radiological Exams on Admission: DG Femur Min 2 Views Left Result Date: 10/11/2023 CLINICAL DATA:  fall EXAM: LEFT FEMUR 2 VIEWS; LEFT KNEE - 1-2 VIEW; LEFT TIBIA AND FIBULA - 2 VIEW COMPARISON:  X-ray left hip 10/11/2023 FINDINGS: Left femur: there is no evidence of fracture or other aggressive focal bone lesions. Soft tissues are unremarkable. Vascular calcifications. Left knee: Severe tricompartmental degenerative changes of the knee. Joint effusion. Acute displaced proximal tibial metadiaphysis and fibular head/neck fractures. No dislocation. Left tibia fibula distally: No acute displaced fracture of the tibia fibula distally. Ankle is grossly unremarkable. IMPRESSION: 1. Acute displaced proximal tibial metadiaphysis fracture. 2. Acute displaced fibular head/neck fracture. 3. Knee effusion. 4. Severe tricompartmental degenerative changes of the knee. 5. No acute displaced fracture or dislocation of the left hip. 6. No acute displaced fracture or dislocation of the distal left fibula and tibia. Left ankle appears grossly unremarkable. Electronically Signed   By: Morgane  Naveau M.D.   On: 10/11/2023 22:10   DG Knee 2 Views Left Result Date: 10/11/2023 CLINICAL DATA:  fall EXAM: LEFT FEMUR 2 VIEWS;  LEFT KNEE - 1-2 VIEW; LEFT TIBIA AND FIBULA - 2 VIEW COMPARISON:  X-ray left hip 10/11/2023 FINDINGS: Left femur: there is no evidence of fracture or other aggressive focal bone lesions. Soft tissues are unremarkable. Vascular calcifications. Left knee: Severe tricompartmental degenerative changes of the knee. Joint effusion. Acute displaced proximal tibial metadiaphysis and fibular head/neck fractures. No dislocation. Left tibia fibula distally: No acute displaced fracture of the tibia fibula distally. Ankle is grossly unremarkable. IMPRESSION: 1. Acute displaced proximal tibial metadiaphysis fracture. 2. Acute displaced fibular head/neck fracture. 3. Knee effusion. 4. Severe tricompartmental degenerative changes of the knee. 5. No acute displaced fracture or dislocation of the left hip. 6. No acute displaced fracture or dislocation of the distal left fibula and tibia. Left ankle appears grossly unremarkable. Electronically Signed   By: Morgane  Naveau M.D.   On: 10/11/2023 22:10   DG Tibia/Fibula Left Result Date: 10/11/2023 CLINICAL DATA:  fall EXAM: LEFT FEMUR 2 VIEWS; LEFT KNEE - 1-2 VIEW; LEFT TIBIA AND FIBULA - 2 VIEW COMPARISON:  X-ray left hip 10/11/2023 FINDINGS: Left femur: there is no evidence of fracture or other aggressive focal bone lesions. Soft tissues are unremarkable. Vascular calcifications. Left knee: Severe tricompartmental degenerative changes of the knee. Joint effusion. Acute displaced proximal tibial metadiaphysis and fibular head/neck fractures. No dislocation. Left tibia fibula distally: No acute displaced fracture of the tibia fibula distally. Ankle is grossly unremarkable. IMPRESSION: 1. Acute displaced proximal tibial metadiaphysis fracture. 2. Acute displaced fibular head/neck fracture. 3. Knee effusion. 4. Severe tricompartmental degenerative changes of the knee. 5. No acute displaced fracture or dislocation of the left hip. 6. No acute displaced fracture or dislocation of the  distal left fibula and tibia. Left ankle appears grossly unremarkable. Electronically Signed   By: Morgane  Naveau M.D.   On: 10/11/2023 22:10   DG HIP UNILAT WITH PELVIS 2-3 VIEWS LEFT Result Date: 10/11/2023 CLINICAL DATA:  [id number] Fall [id number] EXAM: DG HIP (WITH OR WITHOUT PELVIS) 2-3V LEFT COMPARISON:  None Available. FINDINGS: There is no evidence of hip fracture or dislocation of the left hip. No acute displaced fracture or dislocation of the right hip. There is no evidence of arthropathy or  other focal bone abnormality. Vascular calcifications. Degenerative changes visualized lower lumbar spine. Coarsely calcified lesions overlying the pelvis consistent with degenerative uterine fibroids. IMPRESSION: Negative for acute traumatic injury. Electronically Signed   By: Morgane  Naveau M.D.   On: 10/11/2023 20:42   CT Head Wo Contrast Result Date: 10/11/2023 CLINICAL DATA:  Head trauma, moderate-severe; Neck trauma (Age >= 65y) EXAM: CT HEAD WITHOUT CONTRAST CT CERVICAL SPINE WITHOUT CONTRAST TECHNIQUE: Multidetector CT imaging of the head and cervical spine was performed following the standard protocol without intravenous contrast. Multiplanar CT image reconstructions of the cervical spine were also generated. RADIATION DOSE REDUCTION: This exam was performed according to the departmental dose-optimization program which includes automated exposure control, adjustment of the mA and/or kV according to patient size and/or use of iterative reconstruction technique. COMPARISON:  CT head and C-spine 09/28/2023 FINDINGS: CT HEAD FINDINGS Brain: No evidence of large-territorial acute infarction. No parenchymal hemorrhage. No mass lesion. No extra-axial collection. No mass effect or midline shift. No hydrocephalus. Basilar cisterns are patent. Vascular: No hyperdense vessel. Atherosclerotic calcifications are present within the cavernous internal carotid and vertebral arteries. Skull: No acute fracture or focal lesion.  Sinuses/Orbits: Right maxillary sinus mucosal thickening with associated calcifications. Paranasal sinuses and mastoid air cells are clear. Bilateral lens replacement. Otherwise the orbits are unremarkable. Other: None. CT CERVICAL SPINE FINDINGS Alignment: Stable mild retrolisthesis of C2 on C3. Stable reversal of normal cervical lordosis at the C5-C6 level due to degenerative changes. Skull base and vertebrae: Multilevel severe degenerative changes of the spine. Partial osseous fusion at the C5-C6 level. No associated severe osseous neural foraminal or central canal stenosis. Posterior disc osteophyte complex formation at the C6-C7 level. No acute fracture. No aggressive appearing focal osseous lesion or focal pathologic process. Soft tissues and spinal canal: No prevertebral fluid or swelling. No visible canal hematoma. Upper chest: Unremarkable. Other: Atherosclerotic plaque of the carotid arteries within the neck. IMPRESSION: 1. No acute intracranial abnormality. 2. No acute displaced fracture or traumatic listhesis of the cervical spine. Electronically Signed   By: Morgane  Naveau M.D.   On: 10/11/2023 20:40   CT Cervical Spine Wo Contrast Result Date: 10/11/2023 CLINICAL DATA:  Head trauma, moderate-severe; Neck trauma (Age >= 65y) EXAM: CT HEAD WITHOUT CONTRAST CT CERVICAL SPINE WITHOUT CONTRAST TECHNIQUE: Multidetector CT imaging of the head and cervical spine was performed following the standard protocol without intravenous contrast. Multiplanar CT image reconstructions of the cervical spine were also generated. RADIATION DOSE REDUCTION: This exam was performed according to the departmental dose-optimization program which includes automated exposure control, adjustment of the mA and/or kV according to patient size and/or use of iterative reconstruction technique. COMPARISON:  CT head and C-spine 09/28/2023 FINDINGS: CT HEAD FINDINGS Brain: No evidence of large-territorial acute infarction. No  parenchymal hemorrhage. No mass lesion. No extra-axial collection. No mass effect or midline shift. No hydrocephalus. Basilar cisterns are patent. Vascular: No hyperdense vessel. Atherosclerotic calcifications are present within the cavernous internal carotid and vertebral arteries. Skull: No acute fracture or focal lesion. Sinuses/Orbits: Right maxillary sinus mucosal thickening with associated calcifications. Paranasal sinuses and mastoid air cells are clear. Bilateral lens replacement. Otherwise the orbits are unremarkable. Other: None. CT CERVICAL SPINE FINDINGS Alignment: Stable mild retrolisthesis of C2 on C3. Stable reversal of normal cervical lordosis at the C5-C6 level due to degenerative changes. Skull base and vertebrae: Multilevel severe degenerative changes of the spine. Partial osseous fusion at the C5-C6 level. No associated severe osseous neural foraminal or central canal  stenosis. Posterior disc osteophyte complex formation at the C6-C7 level. No acute fracture. No aggressive appearing focal osseous lesion or focal pathologic process. Soft tissues and spinal canal: No prevertebral fluid or swelling. No visible canal hematoma. Upper chest: Unremarkable. Other: Atherosclerotic plaque of the carotid arteries within the neck. IMPRESSION: 1. No acute intracranial abnormality. 2. No acute displaced fracture or traumatic listhesis of the cervical spine. Electronically Signed   By: Morgane  Naveau M.D.   On: 10/11/2023 20:40    EKG: I independently viewed the EKG done and my findings are as followed: EKG was not done in the ED  Assessment/Plan Present on Admission:  Closed fracture of left proximal tibia  Leukopenia  Acute kidney injury superimposed on stage 4 chronic kidney disease (HCC)  Chronic heart failure with preserved ejection fraction (HFpEF) (HCC)  Essential hypertension  Type 2 diabetes mellitus with hyperlipidemia (HCC)  Atrial fibrillation, chronic (HCC)  CAD (coronary artery  disease)  Mixed hyperlipidemia  Principal Problem:   Closed fracture of left proximal tibia Active Problems:   Acute kidney injury superimposed on stage 4 chronic kidney disease (HCC)   Atrial fibrillation, chronic (HCC)   Chronic heart failure with preserved ejection fraction (HFpEF) (HCC)   Essential hypertension   Type 2 diabetes mellitus with hyperlipidemia (HCC)   CAD (coronary artery disease)   Mixed hyperlipidemia   Leukopenia   Closed fracture of neck of fibula  Closed fracture of left proximal tibia Acute displaced fibular head and neck fracture Continue Tylenol  for mild pain Continue oxycodone  for moderate/severe pain Continue fall precaution Orthopedic Surgery (Dr. Phyllis Breeze) was consulted and will see patient in the morning per EDP Patient will subsequently require PT/OT eval and treat  Leukopenia possibly reactive WBC 3.9.  Continue to monitor WBC with morning labs  Acute kidney injury on CKD stage IV BUN/creatinine 31/1.99 (baseline creatinine 1.6/1.7). Continue gentle hydration Renally adjust medications, avoid nephrotoxic agents/dehydration/hypotension  Chronic HFpEF Echocardiogram done on 09/15/2021 showed EF 55 to 60% Continue carvedilol , entresto   Essential hypertension Continue amlodipine , carvedilol , Entresto , Imdur   Type 2 diabetes mellitus with hyperlipidemia Hemoglobin A1c on 4//25 was 7.8 Continue Semglee  10 units nightly and adjust dose accordingly Continue ISS and hypoglycemia protocol  Chronic atrial fibrillation Eliquis  will be temporarily held due to possible surgical intervention; this will be restarted if no indication for surgical intervention. Continue carvedilol    Coronary artery disease Patient denies chest pain Continue carvedilol , Imdur , Plavix    Mixed hyperlipidemia Continue Lipitor    DVT prophylaxis: SCDs   Family Communication: Son at bedside   Advance Care Planning:   Code Status: Full Code   Consults: Orthopedic  surgery  Severity of Illness: The appropriate patient status for this patient is INPATIENT. Inpatient status is judged to be reasonable and necessary in order to provide the required intensity of service to ensure the patient's safety. The patient's presenting symptoms, physical exam findings, and initial radiographic and laboratory data in the context of their chronic comorbidities is felt to place them at high risk for further clinical deterioration. Furthermore, it is not anticipated that the patient will be medically stable for discharge from the hospital within 2 midnights of admission.   * I certify that at the point of admission it is my clinical judgment that the patient will require inpatient hospital care spanning beyond 2 midnights from the point of admission due to high intensity of service, high risk for further deterioration and high frequency of surveillance required.*  Author: Alliyah Roesler, DO 10/12/2023 4:47  AM  For on call review [email or web address].

## 2023-10-12 DIAGNOSIS — S82192A Other fracture of upper end of left tibia, initial encounter for closed fracture: Secondary | ICD-10-CM | POA: Diagnosis not present

## 2023-10-12 DIAGNOSIS — S82839A Other fracture of upper and lower end of unspecified fibula, initial encounter for closed fracture: Secondary | ICD-10-CM | POA: Insufficient documentation

## 2023-10-12 LAB — GLUCOSE, CAPILLARY
Glucose-Capillary: 135 mg/dL — ABNORMAL HIGH (ref 70–99)
Glucose-Capillary: 123 mg/dL — ABNORMAL HIGH (ref 70–99)
Glucose-Capillary: 189 mg/dL — ABNORMAL HIGH (ref 70–99)
Glucose-Capillary: 293 mg/dL — ABNORMAL HIGH (ref 70–99)
Glucose-Capillary: 298 mg/dL — ABNORMAL HIGH (ref 70–99)

## 2023-10-12 LAB — COMPREHENSIVE METABOLIC PANEL WITH GFR
ALT: 21 U/L (ref 0–44)
AST: 29 U/L (ref 15–41)
Albumin: 3 g/dL — ABNORMAL LOW (ref 3.5–5.0)
Alkaline Phosphatase: 58 U/L (ref 38–126)
Anion gap: 9 (ref 5–15)
BUN: 29 mg/dL — ABNORMAL HIGH (ref 8–23)
CO2: 25 mmol/L (ref 22–32)
Calcium: 8.8 mg/dL — ABNORMAL LOW (ref 8.9–10.3)
Chloride: 102 mmol/L (ref 98–111)
Creatinine, Ser: 1.7 mg/dL — ABNORMAL HIGH (ref 0.44–1.00)
GFR, Estimated: 30 mL/min — ABNORMAL LOW
Glucose, Bld: 136 mg/dL — ABNORMAL HIGH (ref 70–99)
Potassium: 3.2 mmol/L — ABNORMAL LOW (ref 3.5–5.1)
Sodium: 136 mmol/L (ref 135–145)
Total Bilirubin: 0.7 mg/dL (ref 0.0–1.2)
Total Protein: 5.9 g/dL — ABNORMAL LOW (ref 6.5–8.1)

## 2023-10-12 LAB — CBC
HCT: 31.9 % — ABNORMAL LOW (ref 36.0–46.0)
Hemoglobin: 10.9 g/dL — ABNORMAL LOW (ref 12.0–15.0)
MCH: 27.9 pg (ref 26.0–34.0)
MCHC: 34.2 g/dL (ref 30.0–36.0)
MCV: 81.6 fL (ref 80.0–100.0)
Platelets: 152 10*3/uL (ref 150–400)
RBC: 3.91 MIL/uL (ref 3.87–5.11)
RDW: 14.8 % (ref 11.5–15.5)
WBC: 5 10*3/uL (ref 4.0–10.5)
nRBC: 0 % (ref 0.0–0.2)

## 2023-10-12 LAB — PHOSPHORUS: Phosphorus: 3.5 mg/dL (ref 2.5–4.6)

## 2023-10-12 LAB — MAGNESIUM: Magnesium: 2.2 mg/dL (ref 1.7–2.4)

## 2023-10-12 MED ORDER — ONDANSETRON HCL 4 MG PO TABS: 4.0000 mg | ORAL_TABLET | Freq: Four times a day (QID) | ORAL | Status: DC | PRN

## 2023-10-12 MED ORDER — ATORVASTATIN CALCIUM 40 MG PO TABS: 80.0000 mg | ORAL_TABLET | Freq: Every evening | ORAL | Status: DC

## 2023-10-12 MED ORDER — INSULIN ASPART 100 UNIT/ML IJ SOLN
0.0000 [IU] | Freq: Three times a day (TID) | INTRAMUSCULAR | Status: DC
  Administered 2023-10-12: 2 [IU] via SUBCUTANEOUS
  Administered 2023-10-12: 1 [IU] via SUBCUTANEOUS
  Administered 2023-10-12: 5 [IU] via SUBCUTANEOUS
  Administered 2023-10-13: 3 [IU] via SUBCUTANEOUS

## 2023-10-12 MED ORDER — INSULIN ASPART 100 UNIT/ML IJ SOLN: 0.0000 [IU] | Freq: Every day | INTRAMUSCULAR | Status: DC

## 2023-10-12 MED ORDER — ACETAMINOPHEN 650 MG RE SUPP: 650.0000 mg | Freq: Four times a day (QID) | RECTAL | Status: DC | PRN

## 2023-10-12 MED ORDER — PANTOPRAZOLE SODIUM 40 MG PO TBEC
40.0000 mg | DELAYED_RELEASE_TABLET | Freq: Every day | ORAL | Status: DC
  Administered 2023-10-12 – 2023-10-13 (×2): 40 mg via ORAL
  Filled 2023-10-12 (×2): qty 1

## 2023-10-12 MED ORDER — SACUBITRIL-VALSARTAN 49-51 MG PO TABS
1.0000 | ORAL_TABLET | Freq: Two times a day (BID) | ORAL | Status: DC
  Administered 2023-10-12 – 2023-10-13 (×3): 1 via ORAL
  Filled 2023-10-12 (×5): qty 1

## 2023-10-12 MED ORDER — ISOSORBIDE MONONITRATE ER 60 MG PO TB24
60.0000 mg | ORAL_TABLET | Freq: Every day | ORAL | Status: DC
  Administered 2023-10-12 – 2023-10-13 (×2): 60 mg via ORAL
  Filled 2023-10-12 (×2): qty 1

## 2023-10-12 MED ORDER — INSULIN ASPART 100 UNIT/ML IJ SOLN
0.0000 [IU] | Freq: Every day | INTRAMUSCULAR | Status: DC
Start: 2023-10-12 — End: 2023-10-13
  Administered 2023-10-12: 3 [IU] via SUBCUTANEOUS

## 2023-10-12 MED ORDER — ONDANSETRON HCL 4 MG/2ML IJ SOLN
4.0000 mg | Freq: Four times a day (QID) | INTRAMUSCULAR | Status: DC | PRN
  Administered 2023-10-12: 4 mg via INTRAVENOUS
  Filled 2023-10-12: qty 2

## 2023-10-12 MED ORDER — OXYCODONE HCL 5 MG PO TABS
5.0000 mg | ORAL_TABLET | ORAL | Status: DC | PRN
  Administered 2023-10-12 – 2023-10-13 (×4): 5 mg via ORAL
  Filled 2023-10-12 (×4): qty 1

## 2023-10-12 MED ORDER — CARVEDILOL 3.125 MG PO TABS
3.1250 mg | ORAL_TABLET | Freq: Two times a day (BID) | ORAL | Status: DC
  Administered 2023-10-12 – 2023-10-13 (×3): 3.125 mg via ORAL
  Filled 2023-10-12 (×3): qty 1

## 2023-10-12 MED ORDER — POTASSIUM CHLORIDE CRYS ER 20 MEQ PO TBCR
40.0000 meq | EXTENDED_RELEASE_TABLET | Freq: Once | ORAL | Status: AC
  Administered 2023-10-12: 40 meq via ORAL
  Filled 2023-10-12: qty 2

## 2023-10-12 MED ORDER — EMPAGLIFLOZIN 10 MG PO TABS
10.0000 mg | ORAL_TABLET | Freq: Every day | ORAL | Status: DC
  Administered 2023-10-12 – 2023-10-13 (×2): 10 mg via ORAL
  Filled 2023-10-12 (×2): qty 1

## 2023-10-12 MED ORDER — ACETAMINOPHEN 500 MG PO TABS
1000.0000 mg | ORAL_TABLET | Freq: Three times a day (TID) | ORAL | Status: DC
  Administered 2023-10-12 – 2023-10-13 (×4): 1000 mg via ORAL
  Filled 2023-10-12 (×4): qty 2

## 2023-10-12 MED ORDER — MAGNESIUM SULFATE 2 GM/50ML IV SOLN
2.0000 g | Freq: Once | INTRAVENOUS | Status: AC
  Administered 2023-10-12: 2 g via INTRAVENOUS
  Filled 2023-10-12: qty 50

## 2023-10-12 MED ORDER — CLOPIDOGREL BISULFATE 75 MG PO TABS
75.0000 mg | ORAL_TABLET | Freq: Every day | ORAL | Status: DC
  Administered 2023-10-13: 75 mg via ORAL
  Filled 2023-10-12: qty 1

## 2023-10-12 MED ORDER — INSULIN GLARGINE-YFGN 100 UNIT/ML ~~LOC~~ SOLN
10.0000 [IU] | Freq: Every day | SUBCUTANEOUS | Status: DC
  Administered 2023-10-12: 10 [IU] via SUBCUTANEOUS
  Filled 2023-10-12 (×2): qty 0.1

## 2023-10-12 MED ORDER — POLYETHYLENE GLYCOL 3350 17 G PO PACK
17.0000 g | PACK | Freq: Every day | ORAL | Status: DC
  Administered 2023-10-12 – 2023-10-13 (×2): 17 g via ORAL
  Filled 2023-10-12 (×2): qty 1

## 2023-10-12 MED ORDER — ACETAMINOPHEN 325 MG PO TABS: 650.0000 mg | ORAL_TABLET | Freq: Four times a day (QID) | ORAL | Status: DC | PRN

## 2023-10-12 MED ORDER — AMLODIPINE BESYLATE 5 MG PO TABS
10.0000 mg | ORAL_TABLET | Freq: Every day | ORAL | Status: DC
  Administered 2023-10-12 – 2023-10-13 (×2): 10 mg via ORAL
  Filled 2023-10-12 (×2): qty 2

## 2023-10-12 MED ORDER — LACTATED RINGERS IV SOLN: INTRAVENOUS | Status: AC

## 2023-10-12 MED ORDER — NITROGLYCERIN 0.4 MG SL SUBL: 0.4000 mg | SUBLINGUAL_TABLET | SUBLINGUAL | Status: DC | PRN

## 2023-10-12 MED ORDER — APIXABAN 5 MG PO TABS
5.0000 mg | ORAL_TABLET | Freq: Two times a day (BID) | ORAL | Status: DC
  Administered 2023-10-12 – 2023-10-13 (×3): 5 mg via ORAL
  Filled 2023-10-12 (×3): qty 1

## 2023-10-12 NOTE — Plan of Care (Signed)
  Problem: Acute Rehab PT Goals(only PT should resolve) Goal: Pt Will Go Supine/Side To Sit Outcome: Progressing Flowsheets (Taken 10/12/2023 1434) Pt will go Supine/Side to Sit: with moderate assist Goal: Patient Will Transfer Sit To/From Stand Outcome: Progressing Flowsheets (Taken 10/12/2023 1434) Patient will transfer sit to/from stand:  with minimal assist  with moderate assist Goal: Pt Will Transfer Bed To Chair/Chair To Bed Outcome: Progressing Flowsheets (Taken 10/12/2023 1434) Pt will Transfer Bed to Chair/Chair to Bed: with mod assist Goal: Pt Will Ambulate Outcome: Progressing Flowsheets (Taken 10/12/2023 1434) Pt will Ambulate:  15 feet  with moderate assist  with rolling walker   2:35 PM, 10/12/23 Walton Guppy, MPT Physical Therapist with [facility] [phone] office [phone] mobile phone

## 2023-10-12 NOTE — TOC Progression Note (Addendum)
 Transition of Care [facility]) - Progression Note    Patient Details  Name: Cassandra Adams MRN: [id number] Date of Birth: [birth year]  Transition of Care [facility]) CM/SW Contact  Orelia Binet, RN Phone Number: 10/12/2023, 12:49 PM  Clinical Narrative:   [facility] made a bed offer, Family is accepting. TOC CMA is starting INS AUTH. Patient is medically ready. PNC is getting paper work sign so they can admit on the weekend if Auth received.   ADDENDUM : 4/19 - 4/25, certificate # [id number]    Expected Discharge Plan: Skilled Nursing Facility Barriers to Discharge: Continued Medical Work up, English as a second language teacher, SNF Pending bed offer  Expected Discharge Plan and Services       Living arrangements for the past 2 months: Single Family Home                       Social Determinants of Health (SDOH) Interventions SDOH Screenings   Food Insecurity: No Food Insecurity (10/12/2023)  Housing: Patient Declined (10/12/2023)  Transportation Needs: Patient Declined (10/12/2023)  Utilities: Patient Declined (10/12/2023)  Social Connections: Unknown (10/12/2023)  Recent Concern: Social Connections - Socially Isolated (09/28/2023)  Tobacco Use: Low Risk  (10/11/2023)    Readmission Risk Interventions    10/12/2023   12:15 PM 04/17/2022   12:55 PM 12/12/2021    4:10 PM  Readmission Risk Prevention Plan  Transportation Screening Complete Complete Complete  PCP or Specialist Appt within 5-7 Days  Complete   PCP or Specialist Appt within 3-5 Days Not Complete  Complete  Home Care Screening  Complete   Medication Review (RN CM)  Complete   HRI or Home Care Consult Complete  Complete  Social Work Consult for Recovery Care Planning/Counseling Complete  Complete  Palliative Care Screening Not Applicable  Not Applicable  Medication Review Oceanographer) Complete  Referral to Pharmacy

## 2023-10-12 NOTE — NC FL2 (Signed)
 [place]  MEDICAID FL2 LEVEL OF CARE FORM     IDENTIFICATION  Patient Name: Cassandra Adams Birthdate: 05-Jun-1946 Sex: female Admission Date (Current Location): 10/11/2023  [facility] and [place] Number:  [employer] and Address:  [facility],  [address], [place] [address]      Provider Number: [id number]  Attending Physician Name and Address:  Bobbetta Burnet, MD  Relative Name and Phone Number:  Lorice Roof (Daughter)  [phone]    Current Level of Care: Hospital Recommended Level of Care: Skilled Nursing Facility Prior Approval Number:    Date Approved/Denied:   PASRR Number: [id number]  Discharge Plan: SNF    Current Diagnoses: Patient Active Problem List   Diagnosis Date Noted   Closed fracture of neck of fibula 10/12/2023   Closed fracture of left proximal tibia 10/11/2023   Acute metabolic encephalopathy 09/28/2023   Chronic kidney disease, stage 3b (HCC) 09/28/2023   Uncontrolled type 2 diabetes mellitus with hyperglycemia, with long-term current use of insulin  (HCC) 09/28/2023   Hypertensive emergency 09/17/2023   Troponin I above reference range 09/17/2023   Leukopenia 09/17/2023   Chronic systolic CHF (congestive heart failure) (HCC) 09/17/2023   Pressure injury of skin 04/14/2022   Hyperglycemia due to type 2 diabetes mellitus (HCC) 04/11/2022   Nausea & vomiting 04/11/2022   Severe nonproliferative diabetic retinopathy of right eye without macular edema associated with type 2 diabetes mellitus (HCC) 02/06/2022   Severe nonproliferative diabetic retinopathy of left eye, with macular edema, associated with type 2 diabetes mellitus (HCC) 02/06/2022   A-fib (HCC) 12/10/2021   Demand ischemia (HCC)    Mixed hyperlipidemia 09/15/2021   Diabetic foot ulcer (HCC) 09/15/2021   Chronic heart failure with preserved ejection fraction (HFpEF) (HCC) 09/14/2021   NSTEMI (non-ST elevated myocardial infarction) (HCC) 08/02/2021    Sepsis (HCC) 06/11/2021   Secondary hypercoagulable state (HCC) 01/25/2021   Acute pulmonary edema (HCC)    Acute CHF (congestive heart failure) (HCC) 01/16/2021   Acute respiratory distress 01/16/2021   Microcytic anemia 01/16/2021   Hypertensive urgency 01/16/2021   Acute on chronic diastolic (congestive) heart failure (HCC) 01/16/2021   Persistent atrial fibrillation (HCC)    Gastric nodule    Acute blood loss anemia 11/07/2020   Hematemesis with nausea 11/07/2020   Atrial fibrillation, chronic (HCC) 11/07/2020   Transient hypotension 11/07/2020   Atypical chest pain 11/07/2020   Elevated troponin 11/07/2020   Syncope and collapse 11/05/2020   CAD (coronary artery disease)    Epistaxis due to trauma    Benign paroxysmal positional vertigo 05/11/2019   Chronic headaches 05/11/2019   Atrial fibrillation with RVR (HCC)    Right hemiparesis (HCC) 05/05/2019   Uncontrolled type 2 diabetes mellitus with hyperglycemia (HCC) 05/05/2019   Stage 3b chronic kidney disease (CKD) (HCC) 05/05/2019   Left-sided weakness 05/04/2019   Aphasia 02/26/2019   Type 2 diabetes mellitus with hyperlipidemia (HCC) 06/11/2017   Type 2 diabetes mellitus with stage 3a chronic kidney disease, with long-term current use of insulin  (HCC) 06/11/2017   Carotid stenosis 02/05/2017   TIA (transient ischemic attack) 01/20/2017   Carotid stenosis, right 01/20/2017   Diabetic hyperosmolar non-ketotic state (HCC) 01/19/2017   Acute kidney injury superimposed on stage 4 chronic kidney disease (HCC) 01/19/2017   Left leg weakness 01/19/2017   Dyslipidemia 11/24/2016   Diaphoresis 02/18/2016   Hypoglycemia 02/18/2016   Bradycardia 04/22/2015   Coronary artery disease due to lipid rich plaque 02/26/2014   PAF (paroxysmal atrial  fibrillation) (HCC) 02/26/2014   Occlusion and stenosis of carotid artery without mention of cerebral infarction 11/21/2013   Pain in limb-Left neck 11/21/2013   Carotid stenosis,  bilateral 11/17/2011   Subclinical hyperthyroidism 02/15/2010   GERD 02/15/2010   Dysphagia 02/15/2010   Essential hypertension 02/11/2010    Orientation RESPIRATION BLADDER Height & Weight     Self, Time, Situation, Place  Normal Continent Weight: 74.8 kg Height:  5\' 6"  (167.6 cm)  BEHAVIORAL SYMPTOMS/MOOD NEUROLOGICAL BOWEL NUTRITION STATUS      Continent Diet  AMBULATORY STATUS COMMUNICATION OF NEEDS Skin   Extensive Assist   Bruising                       Personal Care Assistance Level of Assistance  Bathing, Feeding, Dressing Bathing Assistance: Maximum assistance Feeding assistance: Limited assistance Dressing Assistance: Maximum assistance     Functional Limitations Info  Sight, Hearing, Speech Sight Info: Impaired Hearing Info: Adequate Speech Info: Adequate    SPECIAL CARE FACTORS FREQUENCY  PT (By licensed PT)     PT Frequency: 5 times a week              Contractures Contractures Info: Not present    Additional Factors Info  Code Status, Allergies Code Status Info: Full Allergies Info: Penicillins           Current Medications (10/12/2023):  This is the current hospital active medication list Current Facility-Administered Medications  Medication Dose Route Frequency Provider Last Rate Last Admin   acetaminophen  (TYLENOL ) tablet 650 mg  650 mg Oral Q6H PRN Adefeso, Oladapo, DO       Or   acetaminophen  (TYLENOL ) suppository 650 mg  650 mg Rectal Q6H PRN Adefeso, Oladapo, DO       acetaminophen  (TYLENOL ) tablet 1,000 mg  1,000 mg Oral Q8H Shahmehdi, Seyed A, MD   1,000 mg at 10/12/23 0824   amLODipine  (NORVASC ) tablet 10 mg  10 mg Oral Daily Adefeso, Oladapo, DO   10 mg at 10/12/23 1610   apixaban  (ELIQUIS ) tablet 5 mg  5 mg Oral BID Amber Bail A, MD   5 mg at 10/12/23 0824   [START ON 10/13/2023] atorvastatin  (LIPITOR ) tablet 80 mg  80 mg Oral QPM Shahmehdi, Seyed A, MD       carvedilol  (COREG ) tablet 3.125 mg  3.125 mg Oral BID WC  Adefeso, Oladapo, DO   3.125 mg at 10/12/23 0818   [START ON 10/13/2023] clopidogrel  (PLAVIX ) tablet 75 mg  75 mg Oral Daily Shahmehdi, Seyed A, MD       empagliflozin  (JARDIANCE ) tablet 10 mg  10 mg Oral Daily Shahmehdi, Seyed A, MD   10 mg at 10/12/23 9604   insulin  aspart (novoLOG ) injection 0-5 Units  0-5 Units Subcutaneous QHS Adefeso, Oladapo, DO       insulin  aspart (novoLOG ) injection 0-9 Units  0-9 Units Subcutaneous TID WC Adefeso, Oladapo, DO   2 Units at 10/12/23 1153   insulin  glargine-yfgn (SEMGLEE ) injection 10 Units  10 Units Subcutaneous QHS Adefeso, Oladapo, DO       isosorbide  mononitrate (IMDUR ) 24 hr tablet 60 mg  60 mg Oral Daily Adefeso, Oladapo, DO   60 mg at 10/12/23 5409   nitroGLYCERIN  (NITROSTAT ) SL tablet 0.4 mg  0.4 mg Sublingual Q5 min PRN Shahmehdi, Seyed A, MD       ondansetron  (ZOFRAN ) tablet 4 mg  4 mg Oral Q6H PRN Adefeso, Oladapo, DO  Or   ondansetron  (ZOFRAN ) injection 4 mg  4 mg Intravenous Q6H PRN Adefeso, Oladapo, DO   4 mg at 10/12/23 0432   oxyCODONE  (Oxy IR/ROXICODONE ) immediate release tablet 5 mg  5 mg Oral Q4H PRN Adefeso, Oladapo, DO   5 mg at 10/12/23 0433   pantoprazole  (PROTONIX ) EC tablet 40 mg  40 mg Oral Daily Shahmehdi, Seyed A, MD       polyethylene glycol (MIRALAX  / GLYCOLAX ) packet 17 g  17 g Oral Daily Shahmehdi, Seyed A, MD       sacubitril -valsartan  (ENTRESTO ) 49-51 mg per tablet  1 tablet Oral BID Adefeso, Oladapo, DO   1 tablet at 10/12/23 0454     Discharge Medications: Please see discharge summary for a list of discharge medications.  Relevant Imaging Results:  Relevant Lab Results:   Additional Information SS# [id number]  Orelia Binet, RN

## 2023-10-12 NOTE — Plan of Care (Signed)
  Problem: Acute Rehab OT Goals (only OT should resolve) Goal: Pt. Will Perform Grooming Flowsheets (Taken 10/12/2023 1350) Pt Will Perform Grooming: with modified independence Goal: Pt. Will Perform Lower Body Bathing Flowsheets (Taken 10/12/2023 1350) Pt Will Perform Lower Body Bathing:  with contact guard assist  with adaptive equipment  sitting/lateral leans Goal: Pt. Will Perform Lower Body Dressing Flowsheets (Taken 10/12/2023 1350) Pt Will Perform Lower Body Dressing:  with contact guard assist  with adaptive equipment  sitting/lateral leans Goal: Pt. Will Transfer To Toilet Flowsheets (Taken 10/12/2023 1350) Pt Will Transfer to Toilet:  with min assist  stand pivot transfer Goal: Pt. Will Perform Toileting-Clothing Manipulation Flowsheets (Taken 10/12/2023 1350) Pt Will Perform Toileting - Clothing Manipulation and hygiene:  with contact guard assist  with adaptive equipment  sitting/lateral leans Goal: Pt/Caregiver Will Perform Home Exercise Program Flowsheets (Taken 10/12/2023 1350) Pt/caregiver will Perform Home Exercise Program:  Increased strength  Both right and left upper extremity  Independently  Nataliya Graig OT, MOT

## 2023-10-12 NOTE — Evaluation (Signed)
 Physical Therapy Evaluation Patient Details Name: Cassandra Adams MRN: [id number] DOB: 18-Jun-1946 Today's Date: 10/12/2023  History of Present Illness  Cassandra Adams is a 78 y.o. female with medical history significant of CAD/NSTEMI (Feb 2023), paroxysmal atrial fibrillation, CKD stage III, diabetes mellitus type 2, hypertension, hyperlipidemia, and sick sinus syndrome who presents to the emergency department due to a fall.  Patient had a fall at [place] parking lot while trying to get into her car.  She lost balance due to chronically weakness in the left leg and fell backwards hitting the back of her head without losing consciousness.  She [place] to complain of left hip and left leg pain associated with numbness and tingling at the bottom of left foot.  She denies chest pain, neck pain, back pain.  EMS was activated, c-collar was placed   Clinical Impression  Patient required LLE held off bed for moving during supine to siting due to increasing pain/weakness, very unsteady on feet with fair carryover for maintaining TTWB on LLE using RW, limited to a few shuffling side steps before having to sit due to fall risk. Patient tolerated sitting up in chair after therapy with family member present. Patient will benefit from continued skilled physical therapy in hospital and recommended venue below to increase strength, balance, endurance for safe ADLs and gait.        If plan is discharge home, recommend the following: A lot of help with bathing/dressing/bathroom;A lot of help with walking and/or transfers;Help with stairs or ramp for entrance;Assistance with cooking/housework   Can travel by private vehicle   No    Equipment Recommendations None recommended by PT  Recommendations for Other Services       Functional Status Assessment Patient has had a recent decline in their functional status and demonstrates the ability to make significant improvements in function in a reasonable and  predictable amount of time.     Precautions / Restrictions Precautions Precautions: Fall Recall of Precautions/Restrictions: Intact Precaution/Restrictions Comments: Toe touch weight bearing L LE. Required Braces or Orthoses: Knee Immobilizer - Left Restrictions Weight Bearing Restrictions Per Provider Order: Yes LLE Weight Bearing Per Provider Order: Touchdown weight bearing      Mobility  Bed Mobility Overal bed mobility: Needs Assistance Bed Mobility: Supine to Sit     Supine to sit: Mod assist, Max assist     General bed mobility comments: increased time, poor tolerance for moving LLE due to pain    Transfers Overall transfer level: Needs assistance Equipment used: Rolling walker (2 wheels) Transfers: Sit to/from Stand, Bed to chair/wheelchair/BSC Sit to Stand: Mod assist, Max assist   Step pivot transfers: Mod assist, Max assist       General transfer comment: slow labored movement with poor tolerance for weightbearing on LLE due to increasing pain    Ambulation/Gait Ambulation/Gait assistance: Max assist Gait Distance (Feet): 6 Feet Assistive device: Rolling walker (2 wheels) Gait Pattern/deviations: Decreased step length - right, Decreased step length - left, Decreased stride length, Antalgic, Shuffle Gait velocity: slow     General Gait Details: limited to a few shuffling side steps with fair carryover for maintaining TTWB on LLE due to weakness, increasing knee pain  Stairs            Wheelchair Mobility     Tilt Bed    Modified Rankin (Stroke Patients Only)       Balance Overall balance assessment: Needs assistance Sitting-balance support: No upper extremity supported, Feet supported Sitting  balance-Leahy Scale: Fair Sitting balance - Comments: fair to good seated at EOB   Standing balance support: Bilateral upper extremity supported, During functional activity, Reliant on assistive device for balance Standing balance-Leahy Scale:  Poor Standing balance comment: using RW                             Pertinent Vitals/Pain Pain Assessment Pain Assessment: Faces Faces Pain Scale: Hurts even more Pain Location: L LE with movement Pain Descriptors / Indicators: Moaning, Sharp, Guarding Pain Intervention(s): Limited activity within patient's tolerance, Monitored during session, Repositioned    Home Living Family/patient expects to be discharged to:: Private residence Living Arrangements: Alone Available Help at Discharge: Available PRN/intermittently;Friend(s);Family Type of Home: House Home Access: Level entry       Home Layout: One level Home Equipment: Agricultural consultant (2 wheels);Cane - single point;Shower seat;Toilet riser;Grab bars - tub/shower      Prior Function Prior Level of Function : Independent/Modified Independent             Mobility Comments: House hold and short distance community ambulator. Reports having SPC and RW. RW used most. ADLs Comments: Independent ADL's; able to cook; family cleans and gets groceries.     Extremity/Trunk Assessment   Upper Extremity Assessment Upper Extremity Assessment: Defer to OT evaluation    Lower Extremity Assessment Lower Extremity Assessment: Generalized weakness;LLE deficits/detail LLE Deficits / Details: grossly -3/5 LLE: Unable to fully assess due to immobilization LLE Sensation: decreased light touch LLE Coordination: decreased fine motor    Cervical / Trunk Assessment Cervical / Trunk Assessment: Kyphotic  Communication   Communication Communication: No apparent difficulties    Cognition Arousal: Alert Behavior During Therapy: WFL for tasks assessed/performed   PT - Cognitive impairments: No apparent impairments                         Following commands: Intact       Cueing Cueing Techniques: Verbal cues, Tactile cues     General Comments      Exercises     Assessment/Plan    PT Assessment Patient  needs continued PT services  PT Problem List Decreased strength;Decreased range of motion;Decreased activity tolerance;Decreased balance;Decreased mobility;Pain       PT Treatment Interventions DME instruction;Gait training;Stair training;Functional mobility training;Therapeutic activities;Therapeutic exercise;Balance training;Patient/family education    PT Goals (Current goals can be found in the Care Plan section)  Acute Rehab PT Goals Patient Stated Goal: return home after rehab PT Goal Formulation: With patient/family Time For Goal Achievement: 10/26/23 Potential to Achieve Goals: Good    Frequency Min 3X/week     Co-evaluation PT/OT/SLP Co-Evaluation/Treatment: Yes Reason for Co-Treatment: To address functional/ADL transfers PT goals addressed during session: Mobility/safety with mobility;Balance;Proper use of DME OT goals addressed during session: ADL's and self-care       AM-PAC PT "6 Clicks" Mobility  Outcome Measure Help needed turning from your back to your side while in a flat bed without using bedrails?: A Lot Help needed moving from lying on your back to sitting on the side of a flat bed without using bedrails?: A Lot Help needed moving to and from a bed to a chair (including a wheelchair)?: A Lot Help needed standing up from a chair using your arms (e.g., wheelchair or bedside chair)?: A Lot Help needed to walk in hospital room?: A Lot Help needed climbing 3-5 steps with a railing? :  Total 6 Click Score: 11    End of Session   Activity Tolerance: Patient tolerated treatment well;Patient limited by fatigue;Patient limited by pain Patient left: in chair;with call bell/phone within reach;with family/visitor present Nurse Communication: Mobility status PT Visit Diagnosis: Unsteadiness on feet (R26.81);Other abnormalities of gait and mobility (R26.89);Muscle weakness (generalized) (M62.81);History of falling (Z91.81);Pain Pain - Right/Left: Left Pain - part of  body: Knee    Time: 1250-1316 PT Time Calculation (min) (ACUTE ONLY): 26 min   Charges:   PT Evaluation $PT Eval Moderate Complexity: 1 Mod PT Treatments $Therapeutic Activity: 23-37 mins PT General Charges $$ ACUTE PT VISIT: 1 Visit         2:33 PM, 10/12/23 Walton Guppy, MPT Physical Therapist with [facility] [phone] office [phone] mobile phone

## 2023-10-12 NOTE — TOC Initial Note (Signed)
 Transition of Care [facility]) - Initial/Assessment Note    Patient Details  Name: Cassandra Adams MRN: [id number] Date of Birth: [birth year]  Transition of Care [facility]) CM/SW Contact:    Orelia Binet, RN Phone Number: 10/12/2023, 12:23 PM  Clinical Narrative:     Patient admitted with closed fracture of left proximal tibia. No surgery required, Per MD patient will need SNF. PT eval ordered.  First choice is [facility], then [place] area. TOC will sent out for bed offers to discuss with family.                Expected Discharge Plan: Skilled Nursing Facility Barriers to Discharge: Continued Medical Work up, English as a second language teacher, SNF Pending bed offer   Patient Goals and CMS Choice Patient states their goals for this hospitalization and ongoing recovery are:: agreeable to SNF CMS Medicare.gov Compare Post Acute Care list provided to:: Patient Represenative (must comment) Choice offered to / list presented to : Adult Children      Expected Discharge Plan and Services      Living arrangements for the past 2 months: Single Family Home                     Prior Living Arrangements/Services Living arrangements for the past 2 months: Single Family Home Lives with:: Spouse Patient language and need for interpreter reviewed:: Yes        Need for Family Participation in Patient Care: Yes (Comment) Care giver support system in place?: Yes (comment) Current home services: DME Criminal Activity/Legal Involvement Pertinent to Current Situation/Hospitalization: No - Comment as needed  Activities of Daily Living   ADL Screening (condition at time of admission) Independently performs ADLs?: Yes (appropriate for developmental age) Is the patient deaf or have difficulty hearing?: No Does the patient have difficulty seeing, even when wearing glasses/contacts?: No Does the patient have difficulty concentrating, remembering, or making decisions?: No  Permission Sought/Granted                   Emotional Assessment     Affect (typically observed): Accepting Orientation: : Oriented to Self, Oriented to Place, Oriented to  Time, Oriented to Situation Alcohol / Substance Use: Not Applicable    Admission diagnosis:  Closed fracture of left proximal tibia [S82.102A] Closed displaced transverse fracture of shaft of left tibia, initial encounter [S82.222A] Fall in home, initial encounter [W19.XXXA, Y92.009] Other closed fracture of proximal end of left fibula, initial encounter [N56.213Y] Patient Active Problem List   Diagnosis Date Noted   Closed fracture of neck of fibula 10/12/2023   Closed fracture of left proximal tibia 10/11/2023   Acute metabolic encephalopathy 09/28/2023   Chronic kidney disease, stage 3b (HCC) 09/28/2023   Uncontrolled type 2 diabetes mellitus with hyperglycemia, with long-term current use of insulin  (HCC) 09/28/2023   Hypertensive emergency 09/17/2023   Troponin I above reference range 09/17/2023   Leukopenia 09/17/2023   Chronic systolic CHF (congestive heart failure) (HCC) 09/17/2023   Pressure injury of skin 04/14/2022   Hyperglycemia due to type 2 diabetes mellitus (HCC) 04/11/2022   Nausea & vomiting 04/11/2022   Severe nonproliferative diabetic retinopathy of right eye without macular edema associated with type 2 diabetes mellitus (HCC) 02/06/2022   Severe nonproliferative diabetic retinopathy of left eye, with macular edema, associated with type 2 diabetes mellitus (HCC) 02/06/2022   A-fib (HCC) 12/10/2021   Demand ischemia (HCC)    Mixed hyperlipidemia 09/15/2021   Diabetic foot ulcer (HCC) 09/15/2021  Chronic heart failure with preserved ejection fraction (HFpEF) (HCC) 09/14/2021   NSTEMI (non-ST elevated myocardial infarction) (HCC) 08/02/2021   Sepsis (HCC) 06/11/2021   Secondary hypercoagulable state (HCC) 01/25/2021   Acute pulmonary edema (HCC)    Acute CHF (congestive heart failure) (HCC) 01/16/2021   Acute  respiratory distress 01/16/2021   Microcytic anemia 01/16/2021   Hypertensive urgency 01/16/2021   Acute on chronic diastolic (congestive) heart failure (HCC) 01/16/2021   Persistent atrial fibrillation (HCC)    Gastric nodule    Acute blood loss anemia 11/07/2020   Hematemesis with nausea 11/07/2020   Atrial fibrillation, chronic (HCC) 11/07/2020   Transient hypotension 11/07/2020   Atypical chest pain 11/07/2020   Elevated troponin 11/07/2020   Syncope and collapse 11/05/2020   CAD (coronary artery disease)    Epistaxis due to trauma    Benign paroxysmal positional vertigo 05/11/2019   Chronic headaches 05/11/2019   Atrial fibrillation with RVR (HCC)    Right hemiparesis (HCC) 05/05/2019   Uncontrolled type 2 diabetes mellitus with hyperglycemia (HCC) 05/05/2019   Stage 3b chronic kidney disease (CKD) (HCC) 05/05/2019   Left-sided weakness 05/04/2019   Aphasia 02/26/2019   Type 2 diabetes mellitus with hyperlipidemia (HCC) 06/11/2017   Type 2 diabetes mellitus with stage 3a chronic kidney disease, with long-term current use of insulin  (HCC) 06/11/2017   Carotid stenosis 02/05/2017   TIA (transient ischemic attack) 01/20/2017   Carotid stenosis, right 01/20/2017   Diabetic hyperosmolar non-ketotic state (HCC) 01/19/2017   Acute kidney injury superimposed on stage 4 chronic kidney disease (HCC) 01/19/2017   Left leg weakness 01/19/2017   Dyslipidemia 11/24/2016   Diaphoresis 02/18/2016   Hypoglycemia 02/18/2016   Bradycardia 04/22/2015   Coronary artery disease due to lipid rich plaque 02/26/2014   PAF (paroxysmal atrial fibrillation) (HCC) 02/26/2014   Occlusion and stenosis of carotid artery without mention of cerebral infarction 11/21/2013   Pain in limb-Left neck 11/21/2013   Carotid stenosis, bilateral 11/17/2011   Subclinical hyperthyroidism 02/15/2010   GERD 02/15/2010   Dysphagia 02/15/2010   Essential hypertension 02/11/2010   PCP:  Minus Amel, MD Pharmacy:    [facility] [place], [place] - [address] [place] [address] Phone: [phone] Fax: [phone]     Social Drivers of Health (SDOH) Social History: SDOH Screenings   Food Insecurity: No Food Insecurity (10/12/2023)  Housing: Patient Declined (10/12/2023)  Transportation Needs: Patient Declined (10/12/2023)  Utilities: Patient Declined (10/12/2023)  Social Connections: Unknown (10/12/2023)  Recent Concern: Social Connections - Socially Isolated (09/28/2023)  Tobacco Use: Low Risk  (10/11/2023)   SDOH Interventions:     Readmission Risk Interventions    10/12/2023   12:15 PM 04/17/2022   12:55 PM 12/12/2021    4:10 PM  Readmission Risk Prevention Plan  Transportation Screening Complete Complete Complete  PCP or Specialist Appt within 5-7 Days  Complete   PCP or Specialist Appt within 3-5 Days Not Complete  Complete  Home Care Screening  Complete   Medication Review (RN CM)  Complete   HRI or Home Care Consult Complete  Complete  Social Work Consult for Recovery Care Planning/Counseling Complete  Complete  Palliative Care Screening Not Applicable  Not Applicable  Medication Review Oceanographer) Complete  Referral to Pharmacy

## 2023-10-12 NOTE — Progress Notes (Signed)
 PROGRESS NOTE    Patient: Cassandra Adams                            PCP: Marvine Rush, MD                    DOB: Nov 16, 1945            DOA: 10/11/2023 FMW:[id number]             DOS: 10/12/2023, 12:13 PM   LOS: 1 day   Date of Service: The patient was seen and examined on 10/12/2023  Subjective:   The patient was seen and examined this morning. Hemodynamically stable. No issues overnight .  Brief Narrative:    Cassandra Adams is a 78 y.o. female with medical history significant of CAD/NSTEMI (Feb 2023), paroxysmal atrial fibrillation, CKD stage III, diabetes mellitus type 2, hypertension, hyperlipidemia, and sick sinus syndrome who presents to the emergency department due to a fall.  Patient had a fall at [place] parking lot while trying to get into her car.  She lost balance due to chronically weakness in the left leg and fell backwards hitting the back of her head without losing consciousness.  She Min[place] to complain of left hip and left leg pain associated with numbness and tingling at the bottom of left foot.  She denies chest pain, neck pain, back pain.  EMS was activated, c-collar was placed   ED Course: BP was 164/63, pulse 56 bpm, other vital signs were within normal range.  Workup in the ED showed normal CBC except for WBC of 3.9, BMP was normal except for BUN/creatinine 31/1.99 (baseline creatinine 1.6/1.7). Left tibia and fibula x-ray showed acute displaced proximal tibia metadiaphysis fracture.  Acute displaced fibular head/neck fracture.  No acute displaced fracture or dislocation of the left hip. CT cervical spine without contrast showed no acute displaced fracture or traumatic listhesis of the cervical spine CT of head without contrast showed no acute intracranial normality Orthopedic surgery ( Dr. Margrette) was consulted and will see patient in the morning per EDP Tylenol  was given for pain.  Hospitalist was asked to admit patient for further evaluation and  management.    Assessment & Plan:    Principal Problem:   Closed fracture of left proximal tibia Active Problems:   Acute kidney injury superimposed on stage 4 chronic kidney disease (HCC)   Atrial fibrillation, chronic (HCC)   Chronic heart failure with preserved ejection fraction (HFpEF) (HCC)   Essential hypertension   Type 2 diabetes mellitus with hyperlipidemia (HCC)   CAD (coronary artery disease)   Mixed hyperlipidemia   Leukopenia   Closed fracture of neck of fibula     Closed fracture of left proximal tibia -Acute displaced fibular head and neck fracture -Currently stable, in immobilizer -Discussed with orthopedic Dr. Margrette, at this point nonsurgical -will continue medical management, optimizing analgesics for pain Scheduled Tylenol , as needed oxycodone   -Continue fall precaution -Orthopedic Surgery (Dr. Margrette) was consulted and will see patient in the   -  PT/OT consulted, patient is unable to ambulate needs assist with all ADLs, discussed with TOC would likely need SNF    Leukopenia possibly reactive -WBC 3.9.   -monitoring    Acute kidney injury on CKD stage IV BUN/creatinine 31/1.99 (baseline creatinine 1.6/1.7). Lab Results  Component Value Date   CREATININE 1.70 (H) 10/12/2023   CREATININE 1.99 (H) 10/11/2023   CREATININE 2.15 (H)  10/03/2023    Continue gentle hydration Renally adjust medications, avoid nephrotoxic agents/dehydration/hypotension   Chronic HFpEF Echocardiogram done on 09/15/2021 showed EF 55 to 60% Continue carvedilol , entresto    Essential hypertension Continue amlodipine , carvedilol , Entresto , Imdur    Type 2 diabetes mellitus with hyperlipidemia Hemoglobin A1c on 4//25 was 7.8 Continue Semglee  10 units nightly and adjust dose accordingly Checking CBG q. ACHS, with Isai coverage   Chronic atrial fibrillation Resuming home medication of Eliquis , Continue carvedilol    Coronary artery disease Patient denies chest  pain Continue carvedilol , Imdur , (? Need for Plavix  -patient is on Eliquis  Electronic medical record reviewed, Carotid studies from 08/18/2022 within normal limits, s/p PCI with drug-eluting stent on 2/102023)   Mixed hyperlipidemia Continue Lipitor      --------------------------------------------------------------------------------------------------------------------------------- Nutritional status:  The patient's BMI is: Body mass index is 26.63 kg/m. I agree with the assessment and plan as outlined   Skin Assessment: I have examined the patient's skin and I agree with the wound assessment as performed by wound care team As outlined belowe: Pressure Injury 04/13/22 Coccyx Mid Stage 2 -  Partial thickness loss of dermis presenting as a shallow open injury with a red, pink wound bed without slough. (Active)  04/13/22 1545  Location: Coccyx  Location Orientation: Mid  Staging: Stage 2 -  Partial thickness loss of dermis presenting as a shallow open injury with a red, pink wound bed without slough.  Wound Description (Comments):   Present on Admission: Yes    ------------------------------------------------------------------------------------------------------------------------------------------------  DVT prophylaxis:  Place and maintain sequential compression device Start: 10/12/23 0726 SCDs Start: 10/12/23 0410 apixaban  (ELIQUIS ) tablet 5 mg   Code Status:   Code Status: Full Code  Family Communication: No family member present at bedside- -Advance care planning has been discussed.   Admission status:   Status is: Inpatient Remains inpatient appropriate because: Needing orthopedic evaluation, IV analgesic, for pain management, PT OT evaluation   Disposition: From  - home             Planning for discharge in 1-2 days: to SNF   Procedures:   No admission procedures for hospital encounter.   Antimicrobials:  Anti-infectives (From admission, onward)    None         Medication:   acetaminophen   1,000 mg Oral Q8H   amLODipine   10 mg Oral Daily   apixaban   5 mg Oral BID   [START ON 10/13/2023] atorvastatin   80 mg Oral QPM   carvedilol   3.125 mg Oral BID WC   empagliflozin   10 mg Oral Daily   insulin  aspart  0-5 Units Subcutaneous QHS   insulin  aspart  0-9 Units Subcutaneous TID WC   insulin  glargine-yfgn  10 Units Subcutaneous QHS   isosorbide  mononitrate  60 mg Oral Daily   sacubitril -valsartan   1 tablet Oral BID    acetaminophen  **OR** acetaminophen , nitroGLYCERIN , ondansetron  **OR** ondansetron  (ZOFRAN ) IV, oxyCODONE    Objective:   Vitals:   10/12/23 0040 10/12/23 0506 10/12/23 0818 10/12/23 1004  BP: (!) 182/72 (!) 126/53 (!) 155/56 (!) 105/42  Pulse: 65 (!) 56 (!) 59 (!) 49  Resp: 18 18  18   Temp: 97.9 F (36.6 C) 98.2 F (36.8 C)  98.9 F (37.2 C)  TempSrc: Oral Oral  Oral  SpO2: 96% 97%  94%  Weight:      Height:        Intake/Output Summary (Last 24 hours) at 10/12/2023 1213 Last data filed at 10/12/2023 0800 Gross per 24 hour  Intake 220  ml  Output 600 ml  Net -380 ml   Filed Weights   10/11/23 1823  Weight: 74.8 kg     Physical examination:   Constitution:  Alert, cooperative, no distress,  Appears calm and comfortable  Psychiatric:   Normal and stable mood and affect, cognition intact,   HEENT:        Normocephalic, PERRL, otherwise with in Normal limits  Chest:         Chest symmetric Cardio vascular:  S1/S2, RRR, No murmure, No Rubs or Gallops  pulmonary: Clear to auscultation bilaterally, respirations unlabored, negative wheezes / crackles Abdomen: Soft, non-tender, non-distended, bowel sounds,no masses, no organomegaly Muscular skeletal: Left lower extremity pain in immobilizer, positive pulses, limited range of motion Limited exam - in bed, able to move all 3 extremities,   Neuro: CNII-XII intact. , normal motor and sensation, reflexes intact  Extremities: No pitting edema lower extremities, +2 pulses   Skin: Dry, warm to touch, negative for any Rashes, No open wounds Wounds: per nursing documentation   ------------------------------------------------------------------------------------------------------------------------------------------    LABs:     Latest Ref Rng & Units 10/12/2023    5:20 AM 10/11/2023    6:36 PM 10/03/2023   11:42 PM  CBC  WBC 4.0 - 10.5 K/uL 5.0  3.9  3.3   Hemoglobin 12.0 - 15.0 g/dL 89.0  86.6  86.1   Hematocrit 36.0 - 46.0 % 31.9  37.7  39.5   Platelets 150 - 400 K/uL 152  190  183       Latest Ref Rng & Units 10/12/2023    5:20 AM 10/11/2023    6:36 PM 10/03/2023   11:42 PM  CMP  Glucose 70 - 99 mg/dL 863  75  560   BUN 8 - 23 mg/dL 29  31  35   Creatinine 0.44 - 1.00 mg/dL 8.29  8.00  7.84   Sodium 135 - 145 mmol/L 136  138  135   Potassium 3.5 - 5.1 mmol/L 3.2  3.6  3.6   Chloride 98 - 111 mmol/L 102  102  98   CO2 22 - 32 mmol/L 25  27  25    Calcium  8.9 - 10.3 mg/dL 8.8  9.5  9.8   Total Protein 6.5 - 8.1 g/dL 5.9   7.3   Total Bilirubin 0.0 - 1.2 mg/dL 0.7   0.6   Alkaline Phos 38 - 126 U/L 58   101   AST 15 - 41 U/L 29   33   ALT 0 - 44 U/L 21   23        Micro Results No results found for this or any previous visit (from the past 240 hours).  Radiology Reports DG Femur Min 2 Views Left Result Date: 10/11/2023 CLINICAL DATA:  fall EXAM: LEFT FEMUR 2 VIEWS; LEFT KNEE - 1-2 VIEW; LEFT TIBIA AND FIBULA - 2 VIEW COMPARISON:  X-ray left hip 10/11/2023 FINDINGS: Left femur: there is no evidence of fracture or other aggressive focal bone lesions. Soft tissues are unremarkable. Vascular calcifications. Left knee: Severe tricompartmental degenerative changes of the knee. Joint effusion. Acute displaced proximal tibial metadiaphysis and fibular head/neck fractures. No dislocation. Left tibia fibula distally: No acute displaced fracture of the tibia fibula distally. Ankle is grossly unremarkable. IMPRESSION: 1. Acute displaced proximal tibial  metadiaphysis fracture. 2. Acute displaced fibular head/neck fracture. 3. Knee effusion. 4. Severe tricompartmental degenerative changes of the knee. 5. No acute displaced fracture or dislocation of the left  hip. 6. No acute displaced fracture or dislocation of the distal left fibula and tibia. Left ankle appears grossly unremarkable. Electronically Signed   By: Morgane  Naveau M.D.   On: 10/11/2023 22:10   DG Knee 2 Views Left Result Date: 10/11/2023 CLINICAL DATA:  fall EXAM: LEFT FEMUR 2 VIEWS; LEFT KNEE - 1-2 VIEW; LEFT TIBIA AND FIBULA - 2 VIEW COMPARISON:  X-ray left hip 10/11/2023 FINDINGS: Left femur: there is no evidence of fracture or other aggressive focal bone lesions. Soft tissues are unremarkable. Vascular calcifications. Left knee: Severe tricompartmental degenerative changes of the knee. Joint effusion. Acute displaced proximal tibial metadiaphysis and fibular head/neck fractures. No dislocation. Left tibia fibula distally: No acute displaced fracture of the tibia fibula distally. Ankle is grossly unremarkable. IMPRESSION: 1. Acute displaced proximal tibial metadiaphysis fracture. 2. Acute displaced fibular head/neck fracture. 3. Knee effusion. 4. Severe tricompartmental degenerative changes of the knee. 5. No acute displaced fracture or dislocation of the left hip. 6. No acute displaced fracture or dislocation of the distal left fibula and tibia. Left ankle appears grossly unremarkable. Electronically Signed   By: Morgane  Naveau M.D.   On: 10/11/2023 22:10   DG Tibia/Fibula Left Result Date: 10/11/2023 CLINICAL DATA:  fall EXAM: LEFT FEMUR 2 VIEWS; LEFT KNEE - 1-2 VIEW; LEFT TIBIA AND FIBULA - 2 VIEW COMPARISON:  X-ray left hip 10/11/2023 FINDINGS: Left femur: there is no evidence of fracture or other aggressive focal bone lesions. Soft tissues are unremarkable. Vascular calcifications. Left knee: Severe tricompartmental degenerative changes of the knee. Joint effusion. Acute displaced  proximal tibial metadiaphysis and fibular head/neck fractures. No dislocation. Left tibia fibula distally: No acute displaced fracture of the tibia fibula distally. Ankle is grossly unremarkable. IMPRESSION: 1. Acute displaced proximal tibial metadiaphysis fracture. 2. Acute displaced fibular head/neck fracture. 3. Knee effusion. 4. Severe tricompartmental degenerative changes of the knee. 5. No acute displaced fracture or dislocation of the left hip. 6. No acute displaced fracture or dislocation of the distal left fibula and tibia. Left ankle appears grossly unremarkable. Electronically Signed   By: Morgane  Naveau M.D.   On: 10/11/2023 22:10   DG HIP UNILAT WITH PELVIS 2-3 VIEWS LEFT Result Date: 10/11/2023 CLINICAL DATA:  [id number] Fall [id number] EXAM: DG HIP (WITH OR WITHOUT PELVIS) 2-3V LEFT COMPARISON:  None Available. FINDINGS: There is no evidence of hip fracture or dislocation of the left hip. No acute displaced fracture or dislocation of the right hip. There is no evidence of arthropathy or other focal bone abnormality. Vascular calcifications. Degenerative changes visualized lower lumbar spine. Coarsely calcified lesions overlying the pelvis consistent with degenerative uterine fibroids. IMPRESSION: Negative for acute traumatic injury. Electronically Signed   By: Morgane  Naveau M.D.   On: 10/11/2023 20:42   CT Head Wo Contrast Result Date: 10/11/2023 CLINICAL DATA:  Head trauma, moderate-severe; Neck trauma (Age >= 65y) EXAM: CT HEAD WITHOUT CONTRAST CT CERVICAL SPINE WITHOUT CONTRAST TECHNIQUE: Multidetector CT imaging of the head and cervical spine was performed following the standard protocol without intravenous contrast. Multiplanar CT image reconstructions of the cervical spine were also generated. RADIATION DOSE REDUCTION: This exam was performed according to the departmental dose-optimization program which includes automated exposure control, adjustment of the mA and/or kV according to patient  size and/or use of iterative reconstruction technique. COMPARISON:  CT head and C-spine 09/28/2023 FINDINGS: CT HEAD FINDINGS Brain: No evidence of large-territorial acute infarction. No parenchymal hemorrhage. No mass lesion. No extra-axial collection. No mass effect or midline shift. No  hydrocephalus. Basilar cisterns are patent. Vascular: No hyperdense vessel. Atherosclerotic calcifications are present within the cavernous internal carotid and vertebral arteries. Skull: No acute fracture or focal lesion. Sinuses/Orbits: Right maxillary sinus mucosal thickening with associated calcifications. Paranasal sinuses and mastoid air cells are clear. Bilateral lens replacement. Otherwise the orbits are unremarkable. Other: None. CT CERVICAL SPINE FINDINGS Alignment: Stable mild retrolisthesis of C2 on C3. Stable reversal of normal cervical lordosis at the C5-C6 level due to degenerative changes. Skull base and vertebrae: Multilevel severe degenerative changes of the spine. Partial osseous fusion at the C5-C6 level. No associated severe osseous neural foraminal or central canal stenosis. Posterior disc osteophyte complex formation at the C6-C7 level. No acute fracture. No aggressive appearing focal osseous lesion or focal pathologic process. Soft tissues and spinal canal: No prevertebral fluid or swelling. No visible canal hematoma. Upper chest: Unremarkable. Other: Atherosclerotic plaque of the carotid arteries within the neck. IMPRESSION: 1. No acute intracranial abnormality. 2. No acute displaced fracture or traumatic listhesis of the cervical spine. Electronically Signed   By: Morgane  Naveau M.D.   On: 10/11/2023 20:40   CT Cervical Spine Wo Contrast Result Date: 10/11/2023 CLINICAL DATA:  Head trauma, moderate-severe; Neck trauma (Age >= 65y) EXAM: CT HEAD WITHOUT CONTRAST CT CERVICAL SPINE WITHOUT CONTRAST TECHNIQUE: Multidetector CT imaging of the head and cervical spine was performed following the standard  protocol without intravenous contrast. Multiplanar CT image reconstructions of the cervical spine were also generated. RADIATION DOSE REDUCTION: This exam was performed according to the departmental dose-optimization program which includes automated exposure control, adjustment of the mA and/or kV according to patient size and/or use of iterative reconstruction technique. COMPARISON:  CT head and C-spine 09/28/2023 FINDINGS: CT HEAD FINDINGS Brain: No evidence of large-territorial acute infarction. No parenchymal hemorrhage. No mass lesion. No extra-axial collection. No mass effect or midline shift. No hydrocephalus. Basilar cisterns are patent. Vascular: No hyperdense vessel. Atherosclerotic calcifications are present within the cavernous internal carotid and vertebral arteries. Skull: No acute fracture or focal lesion. Sinuses/Orbits: Right maxillary sinus mucosal thickening with associated calcifications. Paranasal sinuses and mastoid air cells are clear. Bilateral lens replacement. Otherwise the orbits are unremarkable. Other: None. CT CERVICAL SPINE FINDINGS Alignment: Stable mild retrolisthesis of C2 on C3. Stable reversal of normal cervical lordosis at the C5-C6 level due to degenerative changes. Skull base and vertebrae: Multilevel severe degenerative changes of the spine. Partial osseous fusion at the C5-C6 level. No associated severe osseous neural foraminal or central canal stenosis. Posterior disc osteophyte complex formation at the C6-C7 level. No acute fracture. No aggressive appearing focal osseous lesion or focal pathologic process. Soft tissues and spinal canal: No prevertebral fluid or swelling. No visible canal hematoma. Upper chest: Unremarkable. Other: Atherosclerotic plaque of the carotid arteries within the neck. IMPRESSION: 1. No acute intracranial abnormality. 2. No acute displaced fracture or traumatic listhesis of the cervical spine. Electronically Signed   By: Morgane  Naveau M.D.   On:  10/11/2023 20:40    SIGNED: Adriana DELENA Grams, MD, FHM. FAAFP. [facility] - [facility] Time spent - 35 min.  In seeing, evaluating and examining the patient. Reviewing medical records, labs, drawn plan of care. [facility],  Pager (please use [email or web address] to page/ text) Please use Epic Secure Chat for non-urgent communication (7AM-7PM)  If 7PM-7AM, please contact night-coverage [email or web address], 10/12/2023, 12:13 PM

## 2023-10-12 NOTE — Hospital Course (Addendum)
 Cassandra Adams is a 78 y.o. female with medical history significant of CAD/NSTEMI (Feb 2023), paroxysmal atrial fibrillation, CKD stage III, diabetes mellitus type 2, hypertension, hyperlipidemia, and sick sinus syndrome who presents to the emergency department due to a fall.  Patient had a fall at [place] parking lot while trying to get into her car.  She lost balance due to chronically weakness in the left leg and fell backwards hitting the back of her head without losing consciousness.  She [place] to complain of left hip and left leg pain associated with numbness and tingling at the bottom of left foot.  She denies chest pain, neck pain, back pain.  EMS was activated, c-collar was placed   ED Course: BP was 164/63, pulse 56 bpm, other vital signs were within normal range.  Workup in the ED showed normal CBC except for WBC of 3.9, BMP was normal except for BUN/creatinine 31/1.99 (baseline creatinine 1.6/1.7). Left tibia and fibula x-ray showed acute displaced proximal tibia metadiaphysis fracture.  Acute displaced fibular head/neck fracture.  No acute displaced fracture or dislocation of the left hip. CT cervical spine without contrast showed no acute displaced fracture or traumatic listhesis of the cervical spine CT of head without contrast showed no acute intracranial normality Orthopedic surgery ( Dr. Margrette) was consulted and will see patient in the morning per EDP Tylenol  was given for pain.  Hospitalist was asked to admit patient for further evaluation and management.    Assessment & Plan:    Principal Problem:   Closed fracture of left proximal tibia Active Problems:   Acute kidney injury superimposed on stage 4 chronic kidney disease (HCC)   Atrial fibrillation, chronic (HCC)   Chronic heart failure with preserved ejection fraction (HFpEF) (HCC)   Essential hypertension   Type 2 diabetes mellitus with hyperlipidemia (HCC)   CAD (coronary artery disease)   Mixed  hyperlipidemia   Leukopenia   Closed fracture of neck of fibula     Closed fracture of left proximal tibia -Acute displaced fibular head and neck fracture -Currently stable, in immobilizer -Discussed with orthopedic Dr. Margrette, at this point nonsurgical -will continue medical management, optimizing analgesics for pain Scheduled Tylenol , as needed oxycodone   -Continue fall precaution -Orthopedic Surgery (Dr. Margrette) was consulted and will see patient in the   -  PT/OT consulted, patient is unable to ambulate needs assist with all ADLs, discussed with TOC would likely need SNF    Leukopenia possibly reactive -WBC 3.9.   -monitoring    Acute kidney injury on CKD stage IV BUN/creatinine 31/1.99 (baseline creatinine 1.6/1.7). Lab Results  Component Value Date   CREATININE 1.70 (H) 10/12/2023   CREATININE 1.99 (H) 10/11/2023   CREATININE 2.15 (H) 10/03/2023    Continue gentle hydration Renally adjust medications, avoid nephrotoxic agents/dehydration/hypotension   Chronic HFpEF Echocardiogram done on 09/15/2021 showed EF 55 to 60% Continue carvedilol , entresto    Essential hypertension Continue amlodipine , carvedilol , Entresto , Imdur    Type 2 diabetes mellitus with hyperlipidemia Hemoglobin A1c on 4//25 was 7.8 Continue Semglee  10 units nightly and adjust dose accordingly Checking CBG q. ACHS, with Isai coverage   Chronic atrial fibrillation Resuming home medication of Eliquis , Continue carvedilol    Coronary artery disease Patient denies chest pain Continue carvedilol , Imdur , (? Need for Plavix  -patient is on Eliquis  Electronic medical record reviewed, Carotid studies from 08/18/2022 within normal limits, s/p PCI with drug-eluting stent on 2/102023)   Mixed hyperlipidemia Continue Lipitor 

## 2023-10-12 NOTE — Consult Note (Signed)
 [facility]  Reason for Consult:LEFT PROXIMAL TIBIA FRACTURE  Referring Physician:  Bobbetta Burnet, MDAttending    Cassandra Adams is an 78 y.o. female.  HPI: 78 year old female presented to the emergency room after falling at [facility] injuring her left leg.  She is status post right total knee arthroplasty years ago.  She has chronic weakness of her left lower extremity and frequent giving way episodes  After she fell she was unable to get up and weight-bear she complained of moderate to severe pain in the left knee area.  Apparently had no numbness or tingling at the time pain was nonradiating and was a dull ache but sharp with any attempts at movement  Medical history listed below  Past Medical History:  Diagnosis Date   (HFpEF) heart failure with preserved ejection fraction (HCC)    Limited Echo 7/22: Mild asymmetrical LVH due to scar and thinning of basal posterior wall and background conc LVH, apical and mid segments hyperdynamic, EF 60-65, basal inf-lat and basal inf AK, normal RVSF, RVSP 26.6, mild LAE, mild-moderate MR, mild AV sclerosis w/o AS // Echo 5/22: EF 55-60, RVSP 27, mild to mod LAE, poss small to mod eff; AV sclerosis no AS   Arthritis    "all over"   CAD (coronary artery disease)    a. NSTEMI 12/2013 - occluded dominant RCA with L-R collaterals, moderate prox segmental LAD disease, for medical therapy initially, EF 60% with subtle inferobasal hypokinesia. Consider PCI for refractory CP.   CKD (chronic kidney disease), stage III (HCC)    Hypertension    Migraines    "weekly" (01/06/2014)   Sickle cell trait (HCC)    TIA (transient ischemic attack) 1978   Type II diabetes mellitus (HCC)    Vertigo     Past Surgical History:  Procedure Laterality Date   APPENDECTOMY  March 2014   had appendix frozen   CARDIAC CATHETERIZATION  "years ago" & 01/05/2014   CARDIOVERSION N/A 03/17/2014   Procedure: CARDIOVERSION;  Surgeon: Hazle Lites, MD;   Location: [facility] ENDOSCOPY;  Service: Cardiovascular;  Laterality: N/A;   CARDIOVERSION N/A 01/14/2021   Procedure: CARDIOVERSION;  Surgeon: Hazle Lites, MD;  Location: [facility] ENDOSCOPY;  Service: Cardiovascular;  Laterality: N/A;   CARDIOVERSION N/A 12/13/2021   Procedure: CARDIOVERSION;  Surgeon: Lake Pilgrim, MD;  Location: [facility] ENDOSCOPY;  Service: Cardiovascular;  Laterality: N/A;   CATARACT EXTRACTION W/ INTRAOCULAR LENS  IMPLANT, BILATERAL Bilateral 04/2013-05/2013   CORONARY ATHERECTOMY N/A 08/05/2021   Procedure: CORONARY ATHERECTOMY;  Surgeon: Arnoldo Lapping, MD;  Location: [facility] INVASIVE CV LAB;  Service: Cardiovascular;  Laterality: N/A;   CORONARY PRESSURE/FFR STUDY N/A 08/04/2021   Procedure: INTRAVASCULAR PRESSURE WIRE/FFR STUDY;  Surgeon: Sammy Crisp, MD;  Location: [facility] INVASIVE CV LAB;  Service: Cardiovascular;  Laterality: N/A;   CORONARY STENT INTERVENTION N/A 08/05/2021   Procedure: CORONARY STENT INTERVENTION;  Surgeon: Arnoldo Lapping, MD;  Location: [facility] INVASIVE CV LAB;  Service: Cardiovascular;  Laterality: N/A;   ENDARTERECTOMY Right 02/05/2017   Procedure: ENDARTERECTOMY CAROTID-RIGHT;  Surgeon: Richrd Char, MD;  Location: [facility] OR;  Service: Vascular;  Laterality: Right;   ESOPHAGOGASTRODUODENOSCOPY  11/08/2007    Normal esophagus without evidence of Barrett, mass, erosion/ Normal stomach, duodenal bulb   ESOPHAGOGASTRODUODENOSCOPY (EGD) WITH PROPOFOL  N/A 11/08/2020   Procedure: ESOPHAGOGASTRODUODENOSCOPY (EGD) WITH PROPOFOL ;  Surgeon: Lindle Rhea, MD;  Location: [facility] ENDOSCOPY;  Service: Gastroenterology;  Laterality: N/A;   GASTRIC MOTILITY STUDY  11/13/2007   mildly delayed emptying  subjectively, but normal  analysis-77% of tracer emptied at 2 hours   JOINT REPLACEMENT     LAPAROSCOPIC APPENDECTOMY N/A 09/15/2012   Procedure: APPENDECTOMY LAPAROSCOPIC;  Surgeon: Beau Bound, MD;  Location: [facility] ORS;  Service: General;  Laterality: N/A;   LEFT HEART CATH AND  CORONARY ANGIOGRAPHY N/A 08/04/2021   Procedure: LEFT HEART CATH AND CORONARY ANGIOGRAPHY;  Surgeon: Sammy Crisp, MD;  Location: [facility] INVASIVE CV LAB;  Service: Cardiovascular;  Laterality: N/A;   LEFT HEART CATHETERIZATION WITH CORONARY ANGIOGRAM N/A 01/05/2014   Procedure: LEFT HEART CATHETERIZATION WITH CORONARY ANGIOGRAM;  Surgeon: Avanell Leigh, MD;  Location: [facility] CATH LAB;  Service: Cardiovascular;  Laterality: N/A;   REPLACEMENT TOTAL KNEE Right 05-03-06   TEE WITHOUT CARDIOVERSION N/A 03/17/2014   Procedure: TRANSESOPHAGEAL ECHOCARDIOGRAM (TEE);  Surgeon: Hazle Lites, MD;  Location: [facility] ENDOSCOPY;  Service: Cardiovascular;  Laterality: N/A;   TONSILLECTOMY  1972    Family History  Problem Relation Age of Onset   Hyperlipidemia Mother    Hypertension Mother    Heart attack Mother    Cancer Father        lung   Hypertension Sister    Diabetes Brother    Heart disease Brother    Hyperlipidemia Brother    Hypertension Brother    Heart attack Brother    Other Brother        DVT    Social History:  reports that she has never smoked. She has never used smokeless tobacco. She reports current alcohol use. She reports that she does not use drugs.  Allergies:  Allergies  Allergen Reactions   Penicillins Hives    Medications: I have reviewed the patient's current medications.  Results for orders placed or performed during the hospital encounter of 10/11/23 (from the past 48 hours)  CBC with Differential     Status: Abnormal   Collection Time: 10/11/23  6:36 PM  Result Value Ref Range   WBC 3.9 (L) 4.0 - 10.5 K/uL   RBC 4.66 3.87 - 5.11 MIL/uL   Hemoglobin 13.3 12.0 - 15.0 g/dL   HCT 41.3 24.4 - 01.0 %   MCV 80.9 80.0 - 100.0 fL   MCH 28.5 26.0 - 34.0 pg   MCHC 35.3 30.0 - 36.0 g/dL   RDW 27.2 53.6 - 64.4 %   Platelets 190 150 - 400 K/uL   nRBC 0.0 0.0 - 0.2 %   Neutrophils Relative % 51 %   Neutro Abs 2.0 1.7 - 7.7 K/uL   Lymphocytes Relative 33 %   Lymphs Abs  1.3 0.7 - 4.0 K/uL   Monocytes Relative 11 %   Monocytes Absolute 0.4 0.1 - 1.0 K/uL   Eosinophils Relative 4 %   Eosinophils Absolute 0.1 0.0 - 0.5 K/uL   Basophils Relative 1 %   Basophils Absolute 0.1 0.0 - 0.1 K/uL   Immature Granulocytes 0 %   Abs Immature Granulocytes 0.01 0.00 - 0.07 K/uL    Comment: Performed at [facility], [address] [place], [place] [address]  Basic metabolic panel     Status: Abnormal   Collection Time: 10/11/23  6:36 PM  Result Value Ref Range   Sodium 138 135 - 145 mmol/L   Potassium 3.6 3.5 - 5.1 mmol/L   Chloride 102 98 - 111 mmol/L   CO2 27 22 - 32 mmol/L   Glucose, Bld 75 70 - 99 mg/dL    Comment: Glucose reference range applies only to samples taken  after fasting for at least 8 hours.   BUN 31 (H) 8 - 23 mg/dL   Creatinine, Ser 1.61 (H) 0.44 - 1.00 mg/dL   Calcium  9.5 8.9 - 10.3 mg/dL   GFR, Estimated 25 (L) >60 mL/min    Comment: (NOTE) Calculated using the CKD-EPI Creatinine Equation (2021)    Anion gap 9 5 - 15    Comment: Performed at [facility], [address] [place], [place] [address]  Glucose, capillary     Status: Abnormal   Collection Time: 10/12/23  1:32 AM  Result Value Ref Range   Glucose-Capillary 135 (H) 70 - 99 mg/dL    Comment: Glucose reference range applies only to samples taken after fasting for at least 8 hours.  Comprehensive metabolic panel     Status: Abnormal   Collection Time: 10/12/23  5:20 AM  Result Value Ref Range   Sodium 136 135 - 145 mmol/L   Potassium 3.2 (L) 3.5 - 5.1 mmol/L   Chloride 102 98 - 111 mmol/L   CO2 25 22 - 32 mmol/L   Glucose, Bld 136 (H) 70 - 99 mg/dL    Comment: Glucose reference range applies only to samples taken after fasting for at least 8 hours.   BUN 29 (H) 8 - 23 mg/dL   Creatinine, Ser 5.40 (H) 0.44 - 1.00 mg/dL   Calcium  8.8 (L) 8.9 - 10.3 mg/dL   Total Protein 5.9 (L) 6.5 - 8.1 g/dL   Albumin 3.0 (L) 3.5 - 5.0 g/dL   AST 29 15 - 41 U/L   ALT 21 0 - 44 U/L   Alkaline  Phosphatase 58 38 - 126 U/L   Total Bilirubin 0.7 0.0 - 1.2 mg/dL   GFR, Estimated 30 (L) >60 mL/min    Comment: (NOTE) Calculated using the CKD-EPI Creatinine Equation (2021)    Anion gap 9 5 - 15    Comment: Performed at [facility], [address], [place], [place] [address]  CBC     Status: Abnormal   Collection Time: 10/12/23  5:20 AM  Result Value Ref Range   WBC 5.0 4.0 - 10.5 K/uL   RBC 3.91 3.87 - 5.11 MIL/uL   Hemoglobin 10.9 (L) 12.0 - 15.0 g/dL   HCT 14.7 (L) 82.9 - 56.2 %   MCV 81.6 80.0 - 100.0 fL   MCH 27.9 26.0 - 34.0 pg   MCHC 34.2 30.0 - 36.0 g/dL   RDW 13.0 86.5 - 78.4 %   Platelets 152 150 - 400 K/uL   nRBC 0.0 0.0 - 0.2 %    Comment: Performed at [facility], [address], [place], [place] [address]  Magnesium      Status: None   Collection Time: 10/12/23  5:20 AM  Result Value Ref Range   Magnesium  2.2 1.7 - 2.4 mg/dL    Comment: Performed at [facility], [address], [place], [place] [address]  Phosphorus     Status: None   Collection Time: 10/12/23  5:20 AM  Result Value Ref Range   Phosphorus 3.5 2.5 - 4.6 mg/dL    Comment: Performed at [facility], [address] [place], [place] [address]  Glucose, capillary     Status: Abnormal   Collection Time: 10/12/23  7:33 AM  Result Value Ref Range   Glucose-Capillary 123 (H) 70 - 99 mg/dL    Comment: Glucose reference range applies only to samples taken after fasting for at least 8 hours.   Images listed below  Interpret them independently  #1 2 views left femur no fracture or dislocation no bone lesion osteopenia is noted DG Femur Min 2 Views Left Result Date: 10/11/2023 CLINICAL DATA:  fall EXAM: LEFT FEMUR 2 VIEWS; LEFT KNEE - 1-2 VIEW; LEFT TIBIA AND FIBULA - 2 VIEW COMPARISON:  X-ray left hip 10/11/2023 FINDINGS: Left femur: there is no evidence of fracture or other aggressive focal bone lesions. Soft tissues are unremarkable. Vascular calcifications. Left knee: Severe tricompartmental  degenerative changes of the knee. Joint effusion. Acute displaced proximal tibial metadiaphysis and fibular head/neck fractures. No dislocation. Left tibia fibula distally: No acute displaced fracture of the tibia fibula distally. Ankle is grossly unremarkable. IMPRESSION: 1. Acute displaced proximal tibial metadiaphysis fracture. 2. Acute displaced fibular head/neck fracture. 3. Knee effusion. 4. Severe tricompartmental degenerative changes of the knee. 5. No acute displaced fracture or dislocation of the left hip. 6. No acute displaced fracture or dislocation of the distal left fibula and tibia. Left ankle appears grossly unremarkable. Electronically Signed   By: Morgane  Naveau M.D.   On: 10/11/2023 22:10    #2 2 views left knee oblique fracture proximal tibia and fibula nondisplaced DG Knee 2 Views Left Result Date: 10/11/2023 CLINICAL DATA:  fall EXAM: LEFT FEMUR 2 VIEWS; LEFT KNEE - 1-2 VIEW; LEFT TIBIA AND FIBULA - 2 VIEW COMPARISON:  X-ray left hip 10/11/2023 FINDINGS: Left femur: there is no evidence of fracture or other aggressive focal bone lesions. Soft tissues are unremarkable. Vascular calcifications. Left knee: Severe tricompartmental degenerative changes of the knee. Joint effusion. Acute displaced proximal tibial metadiaphysis and fibular head/neck fractures. No dislocation. Left tibia fibula distally: No acute displaced fracture of the tibia fibula distally. Ankle is grossly unremarkable. IMPRESSION: 1. Acute displaced proximal tibial metadiaphysis fracture. 2. Acute displaced fibular head/neck fracture. 3. Knee effusion. 4. Severe tricompartmental degenerative changes of the knee. 5. No acute displaced fracture or dislocation of the left hip. 6. No acute displaced fracture or dislocation of the distal left fibula and tibia. Left ankle appears grossly unremarkable. Electronically Signed   By: Morgane  Naveau M.D.   On: 10/11/2023 22:10    #3 tibia and fibula AP and lateral no fracture in  the shaft DG Tibia/Fibula Left Result Date: 10/11/2023 CLINICAL DATA:  fall EXAM: LEFT FEMUR 2 VIEWS; LEFT KNEE - 1-2 VIEW; LEFT TIBIA AND FIBULA - 2 VIEW COMPARISON:  X-ray left hip 10/11/2023 FINDINGS: Left femur: there is no evidence of fracture or other aggressive focal bone lesions. Soft tissues are unremarkable. Vascular calcifications. Left knee: Severe tricompartmental degenerative changes of the knee. Joint effusion. Acute displaced proximal tibial metadiaphysis and fibular head/neck fractures. No dislocation. Left tibia fibula distally: No acute displaced fracture of the tibia fibula distally. Ankle is grossly unremarkable. IMPRESSION: 1. Acute displaced proximal tibial metadiaphysis fracture. 2. Acute displaced fibular head/neck fracture. 3. Knee effusion. 4. Severe tricompartmental degenerative changes of the knee. 5. No acute displaced fracture or dislocation of the left hip. 6. No acute displaced fracture or dislocation of the distal left fibula and tibia. Left ankle appears grossly unremarkable. Electronically Signed   By: Morgane  Naveau M.D.   On: 10/11/2023 22:10   #4 left hip and pelvis no fracture or dislocation in the left or right hip DG HIP UNILAT WITH PELVIS 2-3 VIEWS LEFT Result Date: 10/11/2023 CLINICAL DATA:  [id number] Fall [id number] EXAM: DG HIP (WITH OR WITHOUT PELVIS) 2-3V LEFT COMPARISON:  None Available. FINDINGS: There is no evidence of hip fracture or dislocation of  the left hip. No acute displaced fracture or dislocation of the right hip. There is no evidence of arthropathy or other focal bone abnormality. Vascular calcifications. Degenerative changes visualized lower lumbar spine. Coarsely calcified lesions overlying the pelvis consistent with degenerative uterine fibroids. IMPRESSION: Negative for acute traumatic injury. Electronically Signed   By: Morgane  Naveau M.D.   On: 10/11/2023 20:42    CT head was reviewed report only no acute findings she does have some cervical  spondylosis which is chronic CT Head Wo Contrast Result Date: 10/11/2023 CLINICAL DATA:  Head trauma, moderate-severe; Neck trauma (Age >= 65y) EXAM: CT HEAD WITHOUT CONTRAST CT CERVICAL SPINE WITHOUT CONTRAST TECHNIQUE: Multidetector CT imaging of the head and cervical spine was performed following the standard protocol without intravenous contrast. Multiplanar CT image reconstructions of the cervical spine were also generated. RADIATION DOSE REDUCTION: This exam was performed according to the departmental dose-optimization program which includes automated exposure control, adjustment of the mA and/or kV according to patient size and/or use of iterative reconstruction technique. COMPARISON:  CT head and C-spine 09/28/2023 FINDINGS: CT HEAD FINDINGS Brain: No evidence of large-territorial acute infarction. No parenchymal hemorrhage. No mass lesion. No extra-axial collection. No mass effect or midline shift. No hydrocephalus. Basilar cisterns are patent. Vascular: No hyperdense vessel. Atherosclerotic calcifications are present within the cavernous internal carotid and vertebral arteries. Skull: No acute fracture or focal lesion. Sinuses/Orbits: Right maxillary sinus mucosal thickening with associated calcifications. Paranasal sinuses and mastoid air cells are clear. Bilateral lens replacement. Otherwise the orbits are unremarkable. Other: None. CT CERVICAL SPINE FINDINGS Alignment: Stable mild retrolisthesis of C2 on C3. Stable reversal of normal cervical lordosis at the C5-C6 level due to degenerative changes. Skull base and vertebrae: Multilevel severe degenerative changes of the spine. Partial osseous fusion at the C5-C6 level. No associated severe osseous neural foraminal or central canal stenosis. Posterior disc osteophyte complex formation at the C6-C7 level. No acute fracture. No aggressive appearing focal osseous lesion or focal pathologic process. Soft tissues and spinal canal: No prevertebral fluid or  swelling. No visible canal hematoma. Upper chest: Unremarkable. Other: Atherosclerotic plaque of the carotid arteries within the neck. IMPRESSION: 1. No acute intracranial abnormality. 2. No acute displaced fracture or traumatic listhesis of the cervical spine. Electronically Signed   By: Morgane  Naveau M.D.   On: 10/11/2023 20:40    CT cervical spine as stated above CT Cervical Spine Wo Contrast Result Date: 10/11/2023 CLINICAL DATA:  Head trauma, moderate-severe; Neck trauma (Age >= 65y) EXAM: CT HEAD WITHOUT CONTRAST CT CERVICAL SPINE WITHOUT CONTRAST TECHNIQUE: Multidetector CT imaging of the head and cervical spine was performed following the standard protocol without intravenous contrast. Multiplanar CT image reconstructions of the cervical spine were also generated. RADIATION DOSE REDUCTION: This exam was performed according to the departmental dose-optimization program which includes automated exposure control, adjustment of the mA and/or kV according to patient size and/or use of iterative reconstruction technique. COMPARISON:  CT head and C-spine 09/28/2023 FINDINGS: CT HEAD FINDINGS Brain: No evidence of large-territorial acute infarction. No parenchymal hemorrhage. No mass lesion. No extra-axial collection. No mass effect or midline shift. No hydrocephalus. Basilar cisterns are patent. Vascular: No hyperdense vessel. Atherosclerotic calcifications are present within the cavernous internal carotid and vertebral arteries. Skull: No acute fracture or focal lesion. Sinuses/Orbits: Right maxillary sinus mucosal thickening with associated calcifications. Paranasal sinuses and mastoid air cells are clear. Bilateral lens replacement. Otherwise the orbits are unremarkable. Other: None. CT CERVICAL SPINE FINDINGS Alignment: Stable mild  retrolisthesis of C2 on C3. Stable reversal of normal cervical lordosis at the C5-C6 level due to degenerative changes. Skull base and vertebrae: Multilevel severe  degenerative changes of the spine. Partial osseous fusion at the C5-C6 level. No associated severe osseous neural foraminal or central canal stenosis. Posterior disc osteophyte complex formation at the C6-C7 level. No acute fracture. No aggressive appearing focal osseous lesion or focal pathologic process. Soft tissues and spinal canal: No prevertebral fluid or swelling. No visible canal hematoma. Upper chest: Unremarkable. Other: Atherosclerotic plaque of the carotid arteries within the neck. IMPRESSION: 1. No acute intracranial abnormality. 2. No acute displaced fracture or traumatic listhesis of the cervical spine. Electronically Signed   By: Morgane  Naveau M.D.   On: 10/11/2023 20:40    Review of Systems  Constitutional:  Negative for fever and unexpected weight change.  HENT:  Negative for ear discharge, ear pain, mouth sores, nosebleeds, postnasal drip, sore throat and tinnitus.   Eyes: Negative.   Respiratory:  Negative for shortness of breath.   Cardiovascular:  Negative for chest pain.  Gastrointestinal: Negative.   Endocrine: Negative.   Genitourinary: Negative.  Negative for difficulty urinating.  Musculoskeletal:  Positive for arthralgias, gait problem and joint swelling.  Skin: Negative.   Allergic/Immunologic: Negative.   Neurological:  Negative for numbness.   Blood pressure (!) 155/56, pulse (!) 59, temperature 98.2 F (36.8 C), temperature source Oral, resp. rate 18, height 5\' 6"  (1.676 m), weight 74.8 kg, SpO2 97%. Physical Exam Vitals and nursing note reviewed.  Constitutional:      General: She is not in acute distress.    Appearance: Normal appearance. She is normal weight. She is not ill-appearing, toxic-appearing or diaphoretic.  HENT:     Head: Normocephalic and atraumatic.     Right Ear: External ear normal.     Left Ear: External ear normal.     Nose: Nose normal. No congestion or rhinorrhea.     Mouth/Throat:     Pharynx: Oropharynx is clear. No oropharyngeal  exudate.  Eyes:     General: No scleral icterus.       Right eye: No discharge.        Left eye: No discharge.     Extraocular Movements: Extraocular movements intact.     Conjunctiva/sclera: Conjunctivae normal.     Pupils: Pupils are equal, round, and reactive to light.  Cardiovascular:     Rate and Rhythm: Normal rate.     Pulses: Normal pulses.  Pulmonary:     Effort: Pulmonary effort is normal.     Breath sounds: No stridor. No wheezing or rhonchi.  Abdominal:     General: Abdomen is flat. There is no distension.  Musculoskeletal:     Cervical back: Neck supple.  Skin:    General: Skin is warm and dry.     Capillary Refill: Capillary refill takes less than 2 seconds.  Neurological:     General: No focal deficit present.     Mental Status: She is alert and oriented to person, place, and time. Mental status is at baseline.     Cranial Nerves: No cranial nerve deficit.     Sensory: No sensory deficit.     Motor: No weakness.     Coordination: Coordination normal.     Gait: Gait normal.     Deep Tendon Reflexes: Reflexes normal.  Psychiatric:        Mood and Affect: Mood normal.        Behavior:  Behavior normal.        Thought Content: Thought content normal.        Judgment: Judgment normal.    Left lower extremity Mild swelling and joint effusion tenderness over the proximal tibia skin is intact range of motion limited by pain and instability test deferred because of acute fracture muscle tone normal  Right leg shows a well-healed anterior incision for the right total knee with good function in the knee normal range of motion status post total knee no instability, crepitance or muscle tone loss   Assessment/Plan: 78 year old female with fairly significant cardiac history has a nondisplaced fracture of the proximal tibia and fibula of the left lower extremity.  This should be amenable to nonoperative treatment with a knee immobilizer and conversion to a hinged brace  after x-rays show stability of the fracture  Total healing time approximately 12 weeks  For the first 6 weeks she will have to be toe-touch weightbearing on the left  Arrange follow-up 2 weeks for x-ray left knee  Elsa Halls 10/12/2023, 8:34 AM

## 2023-10-12 NOTE — Evaluation (Signed)
 Occupational Therapy Evaluation Patient Details Name: Cassandra Adams MRN: [id number] DOB: Aug 26, 1945 Today's Date: 10/12/2023   History of Present Illness   Cassandra Adams is a 78 y.o. female with medical history significant of CAD/NSTEMI (Feb 2023), paroxysmal atrial fibrillation, CKD stage III, diabetes mellitus type 2, hypertension, hyperlipidemia, and sick sinus syndrome who presents to the emergency department due to a fall.  Patient had a fall at [place] parking lot while trying to get into her car.  She lost balance due to chronically weakness in the left leg and fell backwards hitting the back of her head without losing consciousness.  She [place] to complain of left hip and left leg pain associated with numbness and tingling at the bottom of left foot.  She denies chest pain, neck pain, back pain.  EMS was activated, c-collar was placed (per DO)     Clinical Impressions Pt agreeable to OT and PT co-evaluation. Pt is independent for ADL's at baseline using RW for ambulation most of the time. Pt required mod to max A for bed mobility and transfer to chair with RW. Coaching given for maintaining TTWB precaution with L LE. Knee immobilizer in place during session. Pt needs much assist for lower body dressing at this time due to L LE fracture. Pt demonstrates generally weakness in B UE with good functional use of B UE. Pt left in the chair with call bell within reach and family present. Pt will benefit from continued OT in the hospital and recommended venue below to increase strength, balance, and endurance for safe ADL's.        If plan is discharge home, recommend the following:   A lot of help with walking and/or transfers;A lot of help with bathing/dressing/bathroom;Assistance with cooking/housework;Assist for transportation;Help with stairs or ramp for entrance     Functional Status Assessment   Patient has had a recent decline in their functional status and demonstrates  the ability to make significant improvements in function in a reasonable and predictable amount of time.     Equipment Recommendations   None recommended by OT             Precautions/Restrictions   Precautions Precautions: Fall Recall of Precautions/Restrictions: Intact Precaution/Restrictions Comments: Toe touch weight bearing L LE. Required Braces or Orthoses: Knee Immobilizer - Left Restrictions Weight Bearing Restrictions Per Provider Order: Yes LLE Weight Bearing Per Provider Order: Touchdown weight bearing     Mobility Bed Mobility Overal bed mobility: Needs Assistance Bed Mobility: Supine to Sit     Supine to sit: Mod assist, Max assist     General bed mobility comments: HOB slightly elevated; labored movement with increased pain. Assist to move L LE and come to sit at EOB. Use of bed rail.    Transfers Overall transfer level: Needs assistance Equipment used: Rolling walker (2 wheels) Transfers: Sit to/from Stand, Bed to chair/wheelchair/BSC Sit to Stand: Mod assist, Max assist Stand pivot transfers: Mod assist, Max assist         General transfer comment: much assist to boost from the EOB using RW; Pt shuffling R LE with coaching TTWB precauation for L LE.      Balance Overall balance assessment: Needs assistance Sitting-balance support: No upper extremity supported, Feet supported Sitting balance-Leahy Scale: Fair Sitting balance - Comments: fair to good seated at EOB   Standing balance support: Bilateral upper extremity supported, During functional activity, Reliant on assistive device for balance Standing balance-Leahy Scale: Poor Standing balance comment: using  RW                           ADL either performed or assessed with clinical judgement   ADL Overall ADL's : Needs assistance/impaired     Grooming: Set up;Sitting   Upper Body Bathing: Set up;Sitting   Lower Body Bathing: Maximal assistance;Total assistance;Bed  level   Upper Body Dressing : Set up;Sitting   Lower Body Dressing: Maximal assistance;Total assistance;Bed level   Toilet Transfer: Moderate assistance;Maximal assistance;Rolling walker (2 wheels);Stand-pivot Statistician Details (indicate cue type and reason): Simulated via EOB to chair transfer Toileting- Clothing Manipulation and Hygiene: Maximal assistance;Sitting/lateral lean;Total assistance       Functional mobility during ADLs: Moderate assistance;Maximal assistance;Rolling walker (2 wheels)       Vision Baseline Vision/History: 1 Wears glasses Ability to See in Adequate Light: 1 Impaired Patient Visual Report: No change from baseline Vision Assessment?: No apparent visual deficits     Perception Perception: Not tested       Praxis Praxis: Not tested       Pertinent Vitals/Pain Pain Assessment Pain Assessment: Faces Faces Pain Scale: Hurts even more Pain Location: L LE with movement Pain Descriptors / Indicators: Moaning, Sharp, Guarding Pain Intervention(s): Limited activity within patient's tolerance, Monitored during session, Repositioned     Extremity/Trunk Assessment Upper Extremity Assessment Upper Extremity Assessment: Generalized weakness   Lower Extremity Assessment Lower Extremity Assessment: Defer to PT evaluation   Cervical / Trunk Assessment Cervical / Trunk Assessment: Kyphotic   Communication Communication Communication: No apparent difficulties   Cognition Arousal: Alert Behavior During Therapy: WFL for tasks assessed/performed Cognition: No apparent impairments                               Following commands: Intact       Cueing  General Comments   Cueing Techniques: Verbal cues                 Home Living Family/patient expects to be discharged to:: Private residence Living Arrangements: Alone Available Help at Discharge: Available PRN/intermittently;Friend(s);Family Type of Home: House Home  Access: Level entry     Home Layout: One level     Bathroom Shower/Tub: Chief Strategy Officer: Standard Bathroom Accessibility: Yes   Home Equipment: Agricultural consultant (2 wheels);Cane - single point;Shower seat;Toilet riser;Grab bars - tub/shower          Prior Functioning/Environment Prior Level of Function : Independent/Modified Independent             Mobility Comments: House hold and short distance community ambulator. Reports having SPC and RW. RW used most. ADLs Comments: Independent ADL's; able to cook; family cleans and gets groceries.    OT Problem List: Decreased strength;Decreased activity tolerance;Impaired balance (sitting and/or standing);Pain;Decreased coordination   OT Treatment/Interventions: Self-care/ADL training;Therapeutic exercise;DME and/or AE instruction;Therapeutic activities;Patient/family education;Balance training      OT Goals(Current goals can be found in the care plan section)   Acute Rehab OT Goals Patient Stated Goal: improve function OT Goal Formulation: With patient Time For Goal Achievement: 10/26/23 Potential to Achieve Goals: Good   OT Frequency:  Min 2X/week    Co-evaluation PT/OT/SLP Co-Evaluation/Treatment: Yes Reason for Co-Treatment: To address functional/ADL transfers   OT goals addressed during session: ADL's and self-care  End of Session Equipment Utilized During Treatment: Rolling walker (2 wheels)  Activity Tolerance: Patient tolerated treatment well Patient left: in chair;with call bell/phone within reach;with family/visitor present  OT Visit Diagnosis: Unsteadiness on feet (R26.81);Other abnormalities of gait and mobility (R26.89);Muscle weakness (generalized) (M62.81);History of falling (Z91.81)                Time: 7829-5621 OT Time Calculation (min): 14 min Charges:  OT General Charges $OT Visit: 1 Visit OT Evaluation $OT Eval Low Complexity: 1 Low  Duan Scharnhorst OT, MOT  Thurnell Floss 10/12/2023, 1:47 PM

## 2023-10-12 NOTE — Progress Notes (Signed)
 Patient has c/o pain during shift, which was improved with prn medication.  Vitals have been stable, family present with family last night.  No acute events over night.  Patient just voiced curiosity about diet and fluids.

## 2023-10-13 DIAGNOSIS — R2681 Unsteadiness on feet: Secondary | ICD-10-CM | POA: Diagnosis not present

## 2023-10-13 DIAGNOSIS — I495 Sick sinus syndrome: Secondary | ICD-10-CM | POA: Diagnosis not present

## 2023-10-13 DIAGNOSIS — I5022 Chronic systolic (congestive) heart failure: Secondary | ICD-10-CM | POA: Diagnosis not present

## 2023-10-13 DIAGNOSIS — M159 Polyosteoarthritis, unspecified: Secondary | ICD-10-CM | POA: Diagnosis not present

## 2023-10-13 DIAGNOSIS — Z741 Need for assistance with personal care: Secondary | ICD-10-CM | POA: Diagnosis not present

## 2023-10-13 DIAGNOSIS — E1122 Type 2 diabetes mellitus with diabetic chronic kidney disease: Secondary | ICD-10-CM | POA: Diagnosis not present

## 2023-10-13 DIAGNOSIS — D72818 Other decreased white blood cell count: Secondary | ICD-10-CM | POA: Diagnosis not present

## 2023-10-13 DIAGNOSIS — S82192A Other fracture of upper end of left tibia, initial encounter for closed fracture: Secondary | ICD-10-CM | POA: Diagnosis not present

## 2023-10-13 DIAGNOSIS — E1165 Type 2 diabetes mellitus with hyperglycemia: Secondary | ICD-10-CM | POA: Diagnosis not present

## 2023-10-13 DIAGNOSIS — S8262XD Displaced fracture of lateral malleolus of left fibula, subsequent encounter for closed fracture with routine healing: Secondary | ICD-10-CM | POA: Diagnosis not present

## 2023-10-13 DIAGNOSIS — G9341 Metabolic encephalopathy: Secondary | ICD-10-CM | POA: Diagnosis not present

## 2023-10-13 DIAGNOSIS — R488 Other symbolic dysfunctions: Secondary | ICD-10-CM | POA: Diagnosis not present

## 2023-10-13 DIAGNOSIS — Z794 Long term (current) use of insulin: Secondary | ICD-10-CM | POA: Diagnosis not present

## 2023-10-13 DIAGNOSIS — I214 Non-ST elevation (NSTEMI) myocardial infarction: Secondary | ICD-10-CM | POA: Diagnosis not present

## 2023-10-13 DIAGNOSIS — R1310 Dysphagia, unspecified: Secondary | ICD-10-CM | POA: Diagnosis not present

## 2023-10-13 DIAGNOSIS — I2510 Atherosclerotic heart disease of native coronary artery without angina pectoris: Secondary | ICD-10-CM | POA: Diagnosis not present

## 2023-10-13 DIAGNOSIS — G8191 Hemiplegia, unspecified affecting right dominant side: Secondary | ICD-10-CM | POA: Diagnosis not present

## 2023-10-13 DIAGNOSIS — K219 Gastro-esophageal reflux disease without esophagitis: Secondary | ICD-10-CM | POA: Diagnosis not present

## 2023-10-13 DIAGNOSIS — M6281 Muscle weakness (generalized): Secondary | ICD-10-CM | POA: Diagnosis not present

## 2023-10-13 DIAGNOSIS — N1832 Chronic kidney disease, stage 3b: Secondary | ICD-10-CM | POA: Diagnosis not present

## 2023-10-13 DIAGNOSIS — S82192G Other fracture of upper end of left tibia, subsequent encounter for closed fracture with delayed healing: Secondary | ICD-10-CM

## 2023-10-13 DIAGNOSIS — I700 Atherosclerosis of aorta: Secondary | ICD-10-CM | POA: Diagnosis not present

## 2023-10-13 DIAGNOSIS — R55 Syncope and collapse: Secondary | ICD-10-CM | POA: Diagnosis not present

## 2023-10-13 DIAGNOSIS — S8262XS Displaced fracture of lateral malleolus of left fibula, sequela: Secondary | ICD-10-CM | POA: Diagnosis not present

## 2023-10-13 DIAGNOSIS — I5032 Chronic diastolic (congestive) heart failure: Secondary | ICD-10-CM | POA: Diagnosis not present

## 2023-10-13 DIAGNOSIS — D509 Iron deficiency anemia, unspecified: Secondary | ICD-10-CM | POA: Diagnosis not present

## 2023-10-13 DIAGNOSIS — S82192S Other fracture of upper end of left tibia, sequela: Secondary | ICD-10-CM | POA: Diagnosis not present

## 2023-10-13 DIAGNOSIS — S82132D Displaced fracture of medial condyle of left tibia, subsequent encounter for closed fracture with routine healing: Secondary | ICD-10-CM | POA: Diagnosis not present

## 2023-10-13 DIAGNOSIS — I10 Essential (primary) hypertension: Secondary | ICD-10-CM | POA: Diagnosis not present

## 2023-10-13 DIAGNOSIS — K5909 Other constipation: Secondary | ICD-10-CM | POA: Diagnosis not present

## 2023-10-13 DIAGNOSIS — N184 Chronic kidney disease, stage 4 (severe): Secondary | ICD-10-CM | POA: Diagnosis not present

## 2023-10-13 DIAGNOSIS — E1159 Type 2 diabetes mellitus with other circulatory complications: Secondary | ICD-10-CM | POA: Diagnosis not present

## 2023-10-13 DIAGNOSIS — Z9181 History of falling: Secondary | ICD-10-CM | POA: Diagnosis not present

## 2023-10-13 DIAGNOSIS — S82832S Other fracture of upper and lower end of left fibula, sequela: Secondary | ICD-10-CM | POA: Diagnosis not present

## 2023-10-13 DIAGNOSIS — I152 Hypertension secondary to endocrine disorders: Secondary | ICD-10-CM | POA: Diagnosis not present

## 2023-10-13 DIAGNOSIS — E083411 Diabetes mellitus due to underlying condition with severe nonproliferative diabetic retinopathy with macular edema, right eye: Secondary | ICD-10-CM | POA: Diagnosis not present

## 2023-10-13 DIAGNOSIS — I25110 Atherosclerotic heart disease of native coronary artery with unstable angina pectoris: Secondary | ICD-10-CM | POA: Diagnosis not present

## 2023-10-13 DIAGNOSIS — E785 Hyperlipidemia, unspecified: Secondary | ICD-10-CM | POA: Diagnosis not present

## 2023-10-13 DIAGNOSIS — E1169 Type 2 diabetes mellitus with other specified complication: Secondary | ICD-10-CM | POA: Diagnosis not present

## 2023-10-13 DIAGNOSIS — I480 Paroxysmal atrial fibrillation: Secondary | ICD-10-CM | POA: Diagnosis not present

## 2023-10-13 DIAGNOSIS — E083491 Diabetes mellitus due to underlying condition with severe nonproliferative diabetic retinopathy without macular edema, right eye: Secondary | ICD-10-CM | POA: Diagnosis not present

## 2023-10-13 DIAGNOSIS — I4819 Other persistent atrial fibrillation: Secondary | ICD-10-CM | POA: Diagnosis not present

## 2023-10-13 DIAGNOSIS — I130 Hypertensive heart and chronic kidney disease with heart failure and stage 1 through stage 4 chronic kidney disease, or unspecified chronic kidney disease: Secondary | ICD-10-CM | POA: Diagnosis not present

## 2023-10-13 LAB — CBC
HCT: 32.2 % — ABNORMAL LOW (ref 36.0–46.0)
Hemoglobin: 11 g/dL — ABNORMAL LOW (ref 12.0–15.0)
MCH: 28 pg (ref 26.0–34.0)
MCHC: 34.2 g/dL (ref 30.0–36.0)
MCV: 81.9 fL (ref 80.0–100.0)
Platelets: 149 10*3/uL — ABNORMAL LOW (ref 150–400)
RBC: 3.93 MIL/uL (ref 3.87–5.11)
RDW: 14.9 % (ref 11.5–15.5)
WBC: 4.7 10*3/uL (ref 4.0–10.5)
nRBC: 0 % (ref 0.0–0.2)

## 2023-10-13 LAB — BASIC METABOLIC PANEL WITH GFR
Anion gap: 5 (ref 5–15)
BUN: 32 mg/dL — ABNORMAL HIGH (ref 8–23)
CO2: 30 mmol/L (ref 22–32)
Calcium: 9.2 mg/dL (ref 8.9–10.3)
Chloride: 104 mmol/L (ref 98–111)
Creatinine, Ser: 2.06 mg/dL — ABNORMAL HIGH (ref 0.44–1.00)
GFR, Estimated: 24 mL/min — ABNORMAL LOW (ref >60)
Glucose, Bld: 104 mg/dL — ABNORMAL HIGH (ref 70–99)
Potassium: 4.6 mmol/L (ref 3.5–5.1)
Sodium: 139 mmol/L (ref 135–145)

## 2023-10-13 LAB — GLUCOSE, CAPILLARY
Glucose-Capillary: 99 mg/dL (ref 70–99)
Glucose-Capillary: 204 mg/dL — ABNORMAL HIGH (ref 70–99)

## 2023-10-13 LAB — BRAIN NATRIURETIC PEPTIDE: B Natriuretic Peptide: 268 pg/mL — ABNORMAL HIGH (ref 0.0–100.0)

## 2023-10-13 MED ORDER — FUROSEMIDE 40 MG PO TABS: 40.0000 mg | ORAL_TABLET | Freq: Every day | ORAL | 3 refills | Status: DC

## 2023-10-13 MED ORDER — OXYCODONE HCL 5 MG PO TABS: 5.0000 mg | ORAL_TABLET | ORAL | 0 refills | Status: AC | PRN

## 2023-10-13 MED ORDER — ACETAMINOPHEN 500 MG PO TABS: 1000.0000 mg | ORAL_TABLET | Freq: Three times a day (TID) | ORAL | 0 refills | Status: DC

## 2023-10-13 MED ORDER — METHOCARBAMOL 500 MG PO TABS: 500.0000 mg | ORAL_TABLET | Freq: Three times a day (TID) | ORAL | 0 refills | Status: AC

## 2023-10-13 NOTE — NC FL2 (Signed)
 [place]  MEDICAID [id number] LEVEL OF CARE FORM     IDENTIFICATION  Patient Name: Cassandra Adams Birthdate: 06/26/1946 Sex: female Admission Date (Current Location): 10/11/2023  [facility] and [place] Number:  [employer] and Address:  [facility],  [address] [place] [address]      Provider Number: [id number]  Attending Physician Name and Address:  Bobbetta Burnet, MD  Relative Name and Phone Number:  Lorice Roof (Daughter)  [phone]    Current Level of Care: Hospital Recommended Level of Care: Skilled Nursing Facility Prior Approval Number:    Date Approved/Denied:   PASRR Number: [id number]  Discharge Plan: SNF    Current Diagnoses: Patient Active Problem List   Diagnosis Date Noted   Closed fracture of neck of fibula 10/12/2023   Closed fracture of left proximal tibia 10/11/2023   Acute metabolic encephalopathy 09/28/2023   Chronic kidney disease, stage 3b (HCC) 09/28/2023   Uncontrolled type 2 diabetes mellitus with hyperglycemia, with long-term current use of insulin  (HCC) 09/28/2023   Hypertensive emergency 09/17/2023   Troponin I above reference range 09/17/2023   Leukopenia 09/17/2023   Chronic systolic CHF (congestive heart failure) (HCC) 09/17/2023   Pressure injury of skin 04/14/2022   Hyperglycemia due to type 2 diabetes mellitus (HCC) 04/11/2022   Nausea & vomiting 04/11/2022   Severe nonproliferative diabetic retinopathy of right eye without macular edema associated with type 2 diabetes mellitus (HCC) 02/06/2022   Severe nonproliferative diabetic retinopathy of left eye, with macular edema, associated with type 2 diabetes mellitus (HCC) 02/06/2022   A-fib (HCC) 12/10/2021   Demand ischemia (HCC)    Mixed hyperlipidemia 09/15/2021   Diabetic foot ulcer (HCC) 09/15/2021   Chronic heart failure with preserved ejection fraction (HFpEF) (HCC) 09/14/2021   NSTEMI (non-ST elevated myocardial infarction) (HCC) 08/02/2021    Sepsis (HCC) 06/11/2021   Secondary hypercoagulable state (HCC) 01/25/2021   Acute pulmonary edema (HCC)    Acute CHF (congestive heart failure) (HCC) 01/16/2021   Acute respiratory distress 01/16/2021   Microcytic anemia 01/16/2021   Hypertensive urgency 01/16/2021   Acute on chronic diastolic (congestive) heart failure (HCC) 01/16/2021   Persistent atrial fibrillation (HCC)    Gastric nodule    Acute blood loss anemia 11/07/2020   Hematemesis with nausea 11/07/2020   Atrial fibrillation, chronic (HCC) 11/07/2020   Transient hypotension 11/07/2020   Atypical chest pain 11/07/2020   Elevated troponin 11/07/2020   Syncope and collapse 11/05/2020   CAD (coronary artery disease)    Epistaxis due to trauma    Benign paroxysmal positional vertigo 05/11/2019   Chronic headaches 05/11/2019   Atrial fibrillation with RVR (HCC)    Right hemiparesis (HCC) 05/05/2019   Uncontrolled type 2 diabetes mellitus with hyperglycemia (HCC) 05/05/2019   Stage 3b chronic kidney disease (CKD) (HCC) 05/05/2019   Left-sided weakness 05/04/2019   Aphasia 02/26/2019   Type 2 diabetes mellitus with hyperlipidemia (HCC) 06/11/2017   Type 2 diabetes mellitus with stage 3a chronic kidney disease, with long-term current use of insulin  (HCC) 06/11/2017   Carotid stenosis 02/05/2017   TIA (transient ischemic attack) 01/20/2017   Carotid stenosis, right 01/20/2017   Diabetic hyperosmolar non-ketotic state (HCC) 01/19/2017   Acute kidney injury superimposed on stage 4 chronic kidney disease (HCC) 01/19/2017   Left leg weakness 01/19/2017   Dyslipidemia 11/24/2016   Diaphoresis 02/18/2016   Hypoglycemia 02/18/2016   Bradycardia 04/22/2015   Coronary artery disease due to lipid rich plaque 02/26/2014   PAF (paroxysmal atrial  fibrillation) (HCC) 02/26/2014   Occlusion and stenosis of carotid artery without mention of cerebral infarction 11/21/2013   Pain in limb-Left neck 11/21/2013   Carotid stenosis,  bilateral 11/17/2011   Subclinical hyperthyroidism 02/15/2010   GERD 02/15/2010   Dysphagia 02/15/2010   Essential hypertension 02/11/2010    Orientation RESPIRATION BLADDER Height & Weight     Self, Time, Situation, Place  Normal Continent Weight: 74.8 kg Height:  5\' 6"  (167.6 cm)  BEHAVIORAL SYMPTOMS/MOOD NEUROLOGICAL BOWEL NUTRITION STATUS      Continent Diet  AMBULATORY STATUS COMMUNICATION OF NEEDS Skin   Extensive Assist   Bruising                       Personal Care Assistance Level of Assistance  Bathing, Feeding, Dressing Bathing Assistance: Maximum assistance Feeding assistance: Limited assistance Dressing Assistance: Maximum assistance     Functional Limitations Info  Sight, Hearing, Speech Sight Info: Impaired Hearing Info: Adequate Speech Info: Adequate    SPECIAL CARE FACTORS FREQUENCY  PT (By licensed PT)     PT Frequency: 5 times a week              Contractures Contractures Info: Not present    Additional Factors Info  Code Status, Allergies Code Status Info: Full Allergies Info: Penicillins           Current Medications (10/13/2023):  This is the current hospital active medication list Current Facility-Administered Medications  Medication Dose Route Frequency Provider Last Rate Last Admin   acetaminophen  (TYLENOL ) tablet 650 mg  650 mg Oral Q6H PRN Adefeso, Oladapo, DO       Or   acetaminophen  (TYLENOL ) suppository 650 mg  650 mg Rectal Q6H PRN Adefeso, Oladapo, DO       acetaminophen  (TYLENOL ) tablet 1,000 mg  1,000 mg Oral Q8H Shahmehdi, Seyed A, MD   1,000 mg at 10/13/23 0550   amLODipine  (NORVASC ) tablet 10 mg  10 mg Oral Daily Adefeso, Oladapo, DO   10 mg at 10/13/23 0817   apixaban  (ELIQUIS ) tablet 5 mg  5 mg Oral BID Shahmehdi, Seyed A, MD   5 mg at 10/13/23 0818   atorvastatin  (LIPITOR ) tablet 80 mg  80 mg Oral QPM Shahmehdi, Seyed A, MD       carvedilol  (COREG ) tablet 3.125 mg  3.125 mg Oral BID WC Adefeso, Oladapo, DO    3.125 mg at 10/13/23 0818   clopidogrel  (PLAVIX ) tablet 75 mg  75 mg Oral Daily Shahmehdi, Seyed A, MD   75 mg at 10/13/23 0817   empagliflozin  (JARDIANCE ) tablet 10 mg  10 mg Oral Daily Shahmehdi, Seyed A, MD   10 mg at 10/13/23 0818   insulin  aspart (novoLOG ) injection 0-5 Units  0-5 Units Subcutaneous QHS Adefeso, Oladapo, DO   3 Units at 10/12/23 2122   insulin  aspart (novoLOG ) injection 0-9 Units  0-9 Units Subcutaneous TID WC Adefeso, Oladapo, DO   5 Units at 10/12/23 1655   insulin  glargine-yfgn (SEMGLEE ) injection 10 Units  10 Units Subcutaneous QHS Adefeso, Oladapo, DO   10 Units at 10/12/23 2121   isosorbide  mononitrate (IMDUR ) 24 hr tablet 60 mg  60 mg Oral Daily Adefeso, Oladapo, DO   60 mg at 10/13/23 0818   nitroGLYCERIN  (NITROSTAT ) SL tablet 0.4 mg  0.4 mg Sublingual Q5 min PRN Shahmehdi, Seyed A, MD       ondansetron  (ZOFRAN ) tablet 4 mg  4 mg Oral Q6H PRN Adefeso, Oladapo, DO  Or   ondansetron  (ZOFRAN ) injection 4 mg  4 mg Intravenous Q6H PRN Adefeso, Oladapo, DO   4 mg at 10/12/23 0432   oxyCODONE  (Oxy IR/ROXICODONE ) immediate release tablet 5 mg  5 mg Oral Q4H PRN Adefeso, Oladapo, DO   5 mg at 10/13/23 0646   pantoprazole  (PROTONIX ) EC tablet 40 mg  40 mg Oral Daily Shahmehdi, Seyed A, MD   40 mg at 10/13/23 0818   polyethylene glycol (MIRALAX  / GLYCOLAX ) packet 17 g  17 g Oral Daily Shahmehdi, Seyed A, MD   17 g at 10/13/23 0818   sacubitril -valsartan  (ENTRESTO ) 49-51 mg per tablet  1 tablet Oral BID Adefeso, Oladapo, DO   1 tablet at 10/13/23 0818     Discharge Medications: Allergies as of 10/13/2023       Reactions   Penicillins Hives        Medication List     STOP taking these medications    cefdinir  300 MG capsule Commonly known as: OMNICEF    clopidogrel  75 MG tablet Commonly known as: PLAVIX    doxycycline  100 MG tablet Commonly known as: VIBRA -TABS       TAKE these medications    acetaminophen  500 MG tablet Commonly known as: TYLENOL  Take  2 tablets (1,000 mg total) by mouth every 8 (eight) hours.   amLODipine  10 MG tablet Commonly known as: NORVASC  Take 1 tablet (10 mg total) by mouth daily.   atorvastatin  80 MG tablet Commonly known as: LIPITOR  Take 1 tablet (80 mg total) by mouth every evening.   Basaglar  KwikPen 100 UNIT/ML Inject 22 Units into the skin daily.   carvedilol  3.125 MG tablet Commonly known as: COREG  Take 1 tablet (3.125 mg total) by mouth 2 (two) times daily with a meal.   Eliquis  5 MG Tabs tablet Generic drug: apixaban  TAKE (1) TABLET BY MOUTH TWICE DAILY.   empagliflozin  10 MG Tabs tablet Commonly known as: Jardiance  Take 1 tablet (10 mg total) by mouth daily.   Entresto  49-51 MG Generic drug: sacubitril -valsartan  TAKE (1) TABLET BY MOUTH TWICE DAILY.   furosemide  40 MG tablet Commonly known as: LASIX  Take 1 tablet (40 mg total) by mouth daily. What changed: how much to take   isosorbide  mononitrate 60 MG 24 hr tablet Commonly known as: IMDUR  Take 60 mg by mouth daily.   methocarbamol  500 MG tablet Commonly known as: ROBAXIN  Take 1 tablet (500 mg total) by mouth 3 (three) times daily for 10 days. What changed:  when to take this reasons to take this   nitroGLYCERIN  0.4 MG SL tablet Commonly known as: NITROSTAT  PLACE 1 TABLET UNDER THE TONGUE EVERY 5 MINUTES AS NEEDED FOR CHEST PAIN (UP TO 3 DOSES)   NovoLOG  FlexPen 100 UNIT/ML FlexPen Generic drug: insulin  aspart Max daily 30 units What changed:  how much to take when to take this additional instructions   oxyCODONE  5 MG immediate release tablet Commonly known as: Oxy IR/ROXICODONE  Take 1 tablet (5 mg total) by mouth every 4 (four) hours as needed for up to 3 days for moderate pain (pain score 4-6) or severe pain (pain score 7-10).   polyethylene glycol 17 g packet Commonly known as: MIRALAX  / GLYCOLAX  Take 17 g by mouth daily. What changed:  when to take this reasons to take this          Relevant Imaging  Results:  Relevant Lab Results:   Additional Information SS# [id number]  Lynda Sands, RN

## 2023-10-13 NOTE — Progress Notes (Signed)
 Attempted to call the daughter to let her know that patient went to [facility] room 119.

## 2023-10-13 NOTE — Plan of Care (Signed)
  Problem: Education: Goal: Knowledge of General Education information will improve Description: Including pain rating scale, medication(s)/side effects and non-pharmacologic comfort measures Outcome: Progressing   Problem: Health Behavior/Discharge Planning: Goal: Ability to manage health-related needs will improve Outcome: Progressing   Problem: Clinical Measurements: Goal: Ability to maintain clinical measurements within normal limits will improve Outcome: Progressing Goal: Will remain free from infection Outcome: Progressing Goal: Diagnostic test results will improve Outcome: Progressing Goal: Respiratory complications will improve Outcome: Progressing Goal: Cardiovascular complication will be avoided Outcome: Progressing   Problem: Nutrition: Goal: Adequate nutrition will be maintained Outcome: Progressing   Problem: Coping: Goal: Level of anxiety will decrease Outcome: Progressing   Problem: Elimination: Goal: Will not experience complications related to bowel motility Outcome: Progressing Goal: Will not experience complications related to urinary retention Outcome: Progressing   Problem: Pain Managment: Goal: General experience of comfort will improve and/or be controlled Outcome: Progressing   Problem: Safety: Goal: Ability to remain free from injury will improve Outcome: Progressing   Problem: Skin Integrity: Goal: Risk for impaired skin integrity will decrease Outcome: Progressing   Problem: Education: Goal: Ability to describe self-care measures that may prevent or decrease complications (Diabetes Survival Skills Education) will improve Outcome: Progressing Goal: Individualized Educational Video(s) Outcome: Progressing   Problem: Coping: Goal: Ability to adjust to condition or change in health will improve Outcome: Progressing   Problem: Fluid Volume: Goal: Ability to maintain a balanced intake and output will improve Outcome: Progressing    Problem: Health Behavior/Discharge Planning: Goal: Ability to manage health-related needs will improve Outcome: Progressing   Problem: Metabolic: Goal: Ability to maintain appropriate glucose levels will improve Outcome: Progressing   Problem: Nutritional: Goal: Maintenance of adequate nutrition will improve Outcome: Progressing Goal: Progress toward achieving an optimal weight will improve Outcome: Progressing   Problem: Skin Integrity: Goal: Risk for impaired skin integrity will decrease Outcome: Progressing   Problem: Tissue Perfusion: Goal: Adequacy of tissue perfusion will improve Outcome: Progressing

## 2023-10-13 NOTE — Discharge Summary (Addendum)
 Physician Discharge Summary   Patient: Cassandra Adams MRN: [id number] DOB: Dec 20, 1945  Admit date:     10/11/2023  Discharge date: 10/13/23  Discharge Physician: Bobbetta Burnet   PCP: Minus Amel, MD   Recommendations at discharge:    Follow-up with orthopedic doctor Dr. Phyllis Breeze in 1-2 week  Follow-up with PCP 1-2 weeks Continue brace/immobilizer per orthopedic recommendation PT OT per orthopedic recommendation, fall precautions,  Discharge Diagnoses: Principal Problem:   Closed fracture of left proximal tibia Active Problems:   Acute kidney injury superimposed on stage 4 chronic kidney disease (HCC)   Atrial fibrillation, chronic (HCC)   Chronic heart failure with preserved ejection fraction (HFpEF) (HCC)   Essential hypertension   Type 2 diabetes mellitus with hyperlipidemia (HCC)   CAD (coronary artery disease)   Mixed hyperlipidemia   Leukopenia   Closed fracture of neck of fibula  Resolved Problems:   * No resolved hospital problems. *  Hospital Course:  Cassandra Adams is a 78 y.o. female with medical history significant of CAD/NSTEMI (Feb 2023), paroxysmal atrial fibrillation, CKD stage III, diabetes mellitus type 2, hypertension, hyperlipidemia, and sick sinus syndrome who presents to the emergency department due to a fall.  Patient had a fall at [place] parking lot while trying to get into her car.  She lost balance due to chronically weakness in the left leg and fell backwards hitting the back of her head without losing consciousness.  She [place] to complain of left hip and left leg pain associated with numbness and tingling at the bottom of left foot.  She denies chest pain, neck pain, back pain.  EMS was activated, c-collar was placed   ED Course: BP was 164/63, pulse 56 bpm, other vital signs were within normal range.  Workup in the ED showed normal CBC except for WBC of 3.9, BMP was normal except for BUN/creatinine 31/1.99 (baseline creatinine  1.6/1.7). Left tibia and fibula x-ray showed acute displaced proximal tibia metadiaphysis fracture.  Acute displaced fibular head/neck fracture.  No acute displaced fracture or dislocation of the left hip. CT cervical spine without contrast showed no acute displaced fracture or traumatic listhesis of the cervical spine CT of head without contrast showed no acute intracranial normality Orthopedic surgery ( Dr. Phyllis Breeze) was consulted and will see patient in the morning per EDP Tylenol  was given for pain.  Hospitalist was asked to admit patient for further evaluation and management.   Closed fracture of left proximal tibia -Acute displaced fibular head and neck fracture -Currently stable, in immobilizer -Discussed with orthopedic Dr. Phyllis Breeze, at this point nonsurgical -will continue medical management, optimizing analgesics for pain Scheduled Tylenol , as needed oxycodone   -Continue fall precaution   PT/OT consulted, patient is unable to ambulate needs assist with all ADLs, discussed with TOC would likely need SNF    Leukopenia possibly reactive -WBC 3.9.   -monitoring    Acute kidney injury on CKD stage IV BUN/creatinine 31/1.99 (baseline creatinine 1.6/1.7). Close to baseline Lab Results  Component Value Date   CREATININE 2.06 (H) 10/13/2023   CREATININE 1.70 (H) 10/12/2023   CREATININE 1.99 (H) 10/11/2023   -Continue oral hydration - avoid nephrotoxic agents/dehydration/hypotension   Chronic HFpEF Echocardiogram done on 09/15/2021 showed EF 55 to 60% Continue carvedilol , entresto    Essential hypertension Continue amlodipine , carvedilol , Entresto , Imdur    Type 2 diabetes mellitus with hyperlipidemia Hemoglobin A1c on 4//25 was 7.8 Carb modified diabetic diet,  continue home regimen insulin    Chronic atrial fibrillation Resuming home  medication of Eliquis , Continue carvedilol    Coronary artery disease Patient denies chest pain Continue carvedilol , Imdur , Eliquis  (?  Need for Plavix ) Electronic medical record reviewed, Carotid studies from 08/18/2022 within normal limits, s/p PCI with drug-eluting stent on 08/05/2021-Per last cardiology report reviewed, Plavix  supposed to be on for total of 6 months. (Also confirm what her daughter-her last PCI was in 2023 and she was supposed to be off Plavix ) Therefor Plavix  discontinued    Mixed hyperlipidemia Continue Lipitor    Consultants: Ortho.  Dr. Phyllis Breeze Procedures performed: None   Disposition: Skilled nursing facility Diet recommendation:  Discharge Diet Orders (From admission, onward)     Start     Ordered   10/13/23 0000  Diet - low sodium heart healthy        10/13/23 1107           Cardiac and Carb modified diet DISCHARGE MEDICATION: Allergies as of 10/13/2023       Reactions   Penicillins Hives        Medication List     STOP taking these medications    cefdinir  300 MG capsule Commonly known as: OMNICEF    clopidogrel  75 MG tablet Commonly known as: PLAVIX    doxycycline  100 MG tablet Commonly known as: VIBRA -TABS       TAKE these medications    acetaminophen  500 MG tablet Commonly known as: TYLENOL  Take 2 tablets (1,000 mg total) by mouth every 8 (eight) hours.   amLODipine  10 MG tablet Commonly known as: NORVASC  Take 1 tablet (10 mg total) by mouth daily.   atorvastatin  80 MG tablet Commonly known as: LIPITOR  Take 1 tablet (80 mg total) by mouth every evening.   Basaglar  KwikPen 100 UNIT/ML Inject 22 Units into the skin daily.   carvedilol  3.125 MG tablet Commonly known as: COREG  Take 1 tablet (3.125 mg total) by mouth 2 (two) times daily with a meal.   Eliquis  5 MG Tabs tablet Generic drug: apixaban  TAKE (1) TABLET BY MOUTH TWICE DAILY.   empagliflozin  10 MG Tabs tablet Commonly known as: Jardiance  Take 1 tablet (10 mg total) by mouth daily.   Entresto  49-51 MG Generic drug: sacubitril -valsartan  TAKE (1) TABLET BY MOUTH TWICE DAILY.   furosemide   40 MG tablet Commonly known as: LASIX  Take 1 tablet (40 mg total) by mouth daily. What changed: how much to take   isosorbide  mononitrate 60 MG 24 hr tablet Commonly known as: IMDUR  Take 60 mg by mouth daily.   methocarbamol  500 MG tablet Commonly known as: ROBAXIN  Take 1 tablet (500 mg total) by mouth 3 (three) times daily for 10 days. What changed:  when to take this reasons to take this   nitroGLYCERIN  0.4 MG SL tablet Commonly known as: NITROSTAT  PLACE 1 TABLET UNDER THE TONGUE EVERY 5 MINUTES AS NEEDED FOR CHEST PAIN (UP TO 3 DOSES)   NovoLOG  FlexPen 100 UNIT/ML FlexPen Generic drug: insulin  aspart Max daily 30 units What changed:  how much to take when to take this additional instructions   oxyCODONE  5 MG immediate release tablet Commonly known as: Oxy IR/ROXICODONE  Take 1 tablet (5 mg total) by mouth every 4 (four) hours as needed for up to 3 days for moderate pain (pain score 4-6) or severe pain (pain score 7-10).   polyethylene glycol 17 g packet Commonly known as: MIRALAX  / GLYCOLAX  Take 17 g by mouth daily. What changed:  when to take this reasons to take this        Discharge Exam:  Filed Weights   10/11/23 1823  Weight: 74.8 kg        General:  AAO x 3,  cooperative, no distress;   HEENT:  Normocephalic, PERRL, otherwise with in Normal limits   Neuro:  CNII-XII intact. , normal motor and sensation, reflexes intact   Lungs:   Clear to auscultation BL, Respirations unlabored,  No wheezes / crackles  Cardio:    S1/S2, RRR, No murmure, No Rubs or Gallops   Abdomen:  Soft, non-tender, bowel sounds active all four quadrants, no guarding or peritoneal signs.  Muscular  skeletal:  Limited exam -left lower extremity in immobilizer, limited range of motion due to pain, pulses and sensation intact -global generalized weaknesses - in bed, able to move all 4 extremities,   2+ pulses,  symmetric, No pitting edema  Skin:  Dry, warm to touch, negative for  any Rashes,  Wounds: Please see nursing documentation  Pressure Injury 04/13/22 Coccyx Mid Stage 2 -  Partial thickness loss of dermis presenting as a shallow open injury with a red, pink wound bed without slough. (Active)  04/13/22 1545  Location: Coccyx  Location Orientation: Mid  Staging: Stage 2 -  Partial thickness loss of dermis presenting as a shallow open injury with a red, pink wound bed without slough.  Wound Description (Comments):   Present on Admission: Yes          Condition at discharge: fair  The results of significant diagnostics from this hospitalization (including imaging, microbiology, ancillary and laboratory) are listed below for reference.   Imaging Studies: DG Femur Min 2 Views Left Result Date: 10/11/2023 CLINICAL DATA:  fall EXAM: LEFT FEMUR 2 VIEWS; LEFT KNEE - 1-2 VIEW; LEFT TIBIA AND FIBULA - 2 VIEW COMPARISON:  X-ray left hip 10/11/2023 FINDINGS: Left femur: there is no evidence of fracture or other aggressive focal bone lesions. Soft tissues are unremarkable. Vascular calcifications. Left knee: Severe tricompartmental degenerative changes of the knee. Joint effusion. Acute displaced proximal tibial metadiaphysis and fibular head/neck fractures. No dislocation. Left tibia fibula distally: No acute displaced fracture of the tibia fibula distally. Ankle is grossly unremarkable. IMPRESSION: 1. Acute displaced proximal tibial metadiaphysis fracture. 2. Acute displaced fibular head/neck fracture. 3. Knee effusion. 4. Severe tricompartmental degenerative changes of the knee. 5. No acute displaced fracture or dislocation of the left hip. 6. No acute displaced fracture or dislocation of the distal left fibula and tibia. Left ankle appears grossly unremarkable. Electronically Signed   By: Morgane  Naveau M.D.   On: 10/11/2023 22:10   DG Knee 2 Views Left Result Date: 10/11/2023 CLINICAL DATA:  fall EXAM: LEFT FEMUR 2 VIEWS; LEFT KNEE - 1-2 VIEW; LEFT TIBIA AND FIBULA - 2  VIEW COMPARISON:  X-ray left hip 10/11/2023 FINDINGS: Left femur: there is no evidence of fracture or other aggressive focal bone lesions. Soft tissues are unremarkable. Vascular calcifications. Left knee: Severe tricompartmental degenerative changes of the knee. Joint effusion. Acute displaced proximal tibial metadiaphysis and fibular head/neck fractures. No dislocation. Left tibia fibula distally: No acute displaced fracture of the tibia fibula distally. Ankle is grossly unremarkable. IMPRESSION: 1. Acute displaced proximal tibial metadiaphysis fracture. 2. Acute displaced fibular head/neck fracture. 3. Knee effusion. 4. Severe tricompartmental degenerative changes of the knee. 5. No acute displaced fracture or dislocation of the left hip. 6. No acute displaced fracture or dislocation of the distal left fibula and tibia. Left ankle appears grossly unremarkable. Electronically Signed   By: Morgane  Naveau M.D.   On:  10/11/2023 22:10   DG Tibia/Fibula Left Result Date: 10/11/2023 CLINICAL DATA:  fall EXAM: LEFT FEMUR 2 VIEWS; LEFT KNEE - 1-2 VIEW; LEFT TIBIA AND FIBULA - 2 VIEW COMPARISON:  X-ray left hip 10/11/2023 FINDINGS: Left femur: there is no evidence of fracture or other aggressive focal bone lesions. Soft tissues are unremarkable. Vascular calcifications. Left knee: Severe tricompartmental degenerative changes of the knee. Joint effusion. Acute displaced proximal tibial metadiaphysis and fibular head/neck fractures. No dislocation. Left tibia fibula distally: No acute displaced fracture of the tibia fibula distally. Ankle is grossly unremarkable. IMPRESSION: 1. Acute displaced proximal tibial metadiaphysis fracture. 2. Acute displaced fibular head/neck fracture. 3. Knee effusion. 4. Severe tricompartmental degenerative changes of the knee. 5. No acute displaced fracture or dislocation of the left hip. 6. No acute displaced fracture or dislocation of the distal left fibula and tibia. Left ankle appears  grossly unremarkable. Electronically Signed   By: Morgane  Naveau M.D.   On: 10/11/2023 22:10   DG HIP UNILAT WITH PELVIS 2-3 VIEWS LEFT Result Date: 10/11/2023 CLINICAL DATA:  [id number] Fall [id number] EXAM: DG HIP (WITH OR WITHOUT PELVIS) 2-3V LEFT COMPARISON:  None Available. FINDINGS: There is no evidence of hip fracture or dislocation of the left hip. No acute displaced fracture or dislocation of the right hip. There is no evidence of arthropathy or other focal bone abnormality. Vascular calcifications. Degenerative changes visualized lower lumbar spine. Coarsely calcified lesions overlying the pelvis consistent with degenerative uterine fibroids. IMPRESSION: Negative for acute traumatic injury. Electronically Signed   By: Morgane  Naveau M.D.   On: 10/11/2023 20:42   CT Head Wo Contrast Result Date: 10/11/2023 CLINICAL DATA:  Head trauma, moderate-severe; Neck trauma (Age >= 65y) EXAM: CT HEAD WITHOUT CONTRAST CT CERVICAL SPINE WITHOUT CONTRAST TECHNIQUE: Multidetector CT imaging of the head and cervical spine was performed following the standard protocol without intravenous contrast. Multiplanar CT image reconstructions of the cervical spine were also generated. RADIATION DOSE REDUCTION: This exam was performed according to the departmental dose-optimization program which includes automated exposure control, adjustment of the mA and/or kV according to patient size and/or use of iterative reconstruction technique. COMPARISON:  CT head and C-spine 09/28/2023 FINDINGS: CT HEAD FINDINGS Brain: No evidence of large-territorial acute infarction. No parenchymal hemorrhage. No mass lesion. No extra-axial collection. No mass effect or midline shift. No hydrocephalus. Basilar cisterns are patent. Vascular: No hyperdense vessel. Atherosclerotic calcifications are present within the cavernous internal carotid and vertebral arteries. Skull: No acute fracture or focal lesion. Sinuses/Orbits: Right maxillary sinus mucosal  thickening with associated calcifications. Paranasal sinuses and mastoid air cells are clear. Bilateral lens replacement. Otherwise the orbits are unremarkable. Other: None. CT CERVICAL SPINE FINDINGS Alignment: Stable mild retrolisthesis of C2 on C3. Stable reversal of normal cervical lordosis at the C5-C6 level due to degenerative changes. Skull base and vertebrae: Multilevel severe degenerative changes of the spine. Partial osseous fusion at the C5-C6 level. No associated severe osseous neural foraminal or central canal stenosis. Posterior disc osteophyte complex formation at the C6-C7 level. No acute fracture. No aggressive appearing focal osseous lesion or focal pathologic process. Soft tissues and spinal canal: No prevertebral fluid or swelling. No visible canal hematoma. Upper chest: Unremarkable. Other: Atherosclerotic plaque of the carotid arteries within the neck. IMPRESSION: 1. No acute intracranial abnormality. 2. No acute displaced fracture or traumatic listhesis of the cervical spine. Electronically Signed   By: Morgane  Naveau M.D.   On: 10/11/2023 20:40   CT Cervical Spine Wo Contrast Result  Date: 10/11/2023 CLINICAL DATA:  Head trauma, moderate-severe; Neck trauma (Age >= 65y) EXAM: CT HEAD WITHOUT CONTRAST CT CERVICAL SPINE WITHOUT CONTRAST TECHNIQUE: Multidetector CT imaging of the head and cervical spine was performed following the standard protocol without intravenous contrast. Multiplanar CT image reconstructions of the cervical spine were also generated. RADIATION DOSE REDUCTION: This exam was performed according to the departmental dose-optimization program which includes automated exposure control, adjustment of the mA and/or kV according to patient size and/or use of iterative reconstruction technique. COMPARISON:  CT head and C-spine 09/28/2023 FINDINGS: CT HEAD FINDINGS Brain: No evidence of large-territorial acute infarction. No parenchymal hemorrhage. No mass lesion. No extra-axial  collection. No mass effect or midline shift. No hydrocephalus. Basilar cisterns are patent. Vascular: No hyperdense vessel. Atherosclerotic calcifications are present within the cavernous internal carotid and vertebral arteries. Skull: No acute fracture or focal lesion. Sinuses/Orbits: Right maxillary sinus mucosal thickening with associated calcifications. Paranasal sinuses and mastoid air cells are clear. Bilateral lens replacement. Otherwise the orbits are unremarkable. Other: None. CT CERVICAL SPINE FINDINGS Alignment: Stable mild retrolisthesis of C2 on C3. Stable reversal of normal cervical lordosis at the C5-C6 level due to degenerative changes. Skull base and vertebrae: Multilevel severe degenerative changes of the spine. Partial osseous fusion at the C5-C6 level. No associated severe osseous neural foraminal or central canal stenosis. Posterior disc osteophyte complex formation at the C6-C7 level. No acute fracture. No aggressive appearing focal osseous lesion or focal pathologic process. Soft tissues and spinal canal: No prevertebral fluid or swelling. No visible canal hematoma. Upper chest: Unremarkable. Other: Atherosclerotic plaque of the carotid arteries within the neck. IMPRESSION: 1. No acute intracranial abnormality. 2. No acute displaced fracture or traumatic listhesis of the cervical spine. Electronically Signed   By: Morgane  Naveau M.D.   On: 10/11/2023 20:40   EEG adult Result Date: 09/28/2023 Arleene Lack, MD     09/28/2023  5:57 PM Patient Name: Cassandra Adams MRN: [id number] Epilepsy Attending: Arleene Lack Referring Physician/Provider: Demaris Fillers, MD Date: 4/42025 Duration: 22.30 mins Patient history: 78yo F with ams. EEG to evaluate for seizure Level of alertness: Awake AEDs during EEG study: None Technical aspects: This EEG study was done with scalp electrodes positioned according to the 10-20 International system of electrode placement. Electrical activity was reviewed with  band pass filter of 1-70Hz , sensitivity of 7 uV/mm, display speed of 83mm/sec with a 60Hz  notched filter applied as appropriate. EEG data were recorded continuously and digitally stored.  Video monitoring was available and reviewed as appropriate. Description: The posterior dominant rhythm consists of 8-9 Hz activity of moderate voltage (25-35 uV) seen predominantly in posterior head regions, symmetric and reactive to eye opening and eye closing. There is 13 to 15 Hz beta activity distributed symmetrically and diffusely. Hyperventilation and photic stimulation were not performed.   IMPRESSION: This study is within normal limits. No seizures or epileptiform discharges were seen throughout the recording. A normal interictal EEG does not exclude the diagnosis of epilepsy. Arleene Lack   DG Wrist Complete Left Result Date: 09/28/2023 CLINICAL DATA:  Left wrist pain EXAM: LEFT WRIST - COMPLETE 3+ VIEW COMPARISON:  None Available. FINDINGS: Normal alignment. No acute fracture or dislocation. Advanced degenerative arthritis at the base of the thumb the first carpometacarpal joint. Remaining joint spaces are preserved. Soft tissues are unremarkable. IMPRESSION: 1. Advanced degenerative arthritis at the base of the thumb. Electronically Signed   By: Worthy Heads M.D.   On: 09/28/2023 00:52  DG Elbow Complete Left Result Date: 09/28/2023 CLINICAL DATA:  Fall, left elbow pain EXAM: LEFT ELBOW - COMPLETE 3+ VIEW COMPARISON:  None Available. FINDINGS: There is no evidence of fracture, dislocation, or joint effusion. There is no evidence of arthropathy or other focal bone abnormality. Subcutaneous gas is seen within the left antecubital fossa, possibly related to attempts at catheterization. IMPRESSION: 1. No fracture or dislocation. 2. Subcutaneous gas within the left antecubital fossa, possibly related to attempts at catheterization. Clinical correlation advised. Electronically Signed   By: Worthy Heads M.D.   On:  09/28/2023 00:50   DG Shoulder Left Result Date: 09/28/2023 CLINICAL DATA:  Fall, left shoulder pain EXAM: LEFT SHOULDER - 2+ VIEW COMPARISON:  None Available. FINDINGS: There is no evidence of fracture or dislocation. There is no evidence of arthropathy or other focal bone abnormality. Soft tissues are unremarkable. IMPRESSION: Negative. Electronically Signed   By: Worthy Heads M.D.   On: 09/28/2023 00:49   DG Chest Port 1 View Result Date: 09/28/2023 CLINICAL DATA:  Cough EXAM: PORTABLE CHEST 1 VIEW COMPARISON:  None Available. FINDINGS: The heart size and mediastinal contours are within normal limits. Both lungs are clear. The visualized skeletal structures are unremarkable. IMPRESSION: No active disease. Electronically Signed   By: Worthy Heads M.D.   On: 09/28/2023 00:49   CT CHEST ABDOMEN PELVIS WO CONTRAST Result Date: 09/28/2023 CLINICAL DATA:  Found down, altered mental status blunt chest and abdominal trauma EXAM: CT CHEST, ABDOMEN AND PELVIS WITHOUT CONTRAST TECHNIQUE: Multidetector CT imaging of the chest, abdomen and pelvis was performed following the standard protocol without IV contrast. RADIATION DOSE REDUCTION: This exam was performed according to the departmental dose-optimization program which includes automated exposure control, adjustment of the mA and/or kV according to patient size and/or use of iterative reconstruction technique. COMPARISON:  04/11/2022 FINDINGS: CT CHEST FINDINGS Cardiovascular: Extensive multi-vessel coronary artery calcification. Global cardiac size within normal limits. No pericardial effusion. Central pulmonary arteries are enlarged in keeping with changes of pulmonary arterial hypertension. Moderate atherosclerotic calcification within the thoracic aorta. No aortic aneurysm. Mediastinum/Nodes: No enlarged mediastinal, hilar, or axillary lymph nodes. Thyroid  gland, trachea, and esophagus demonstrate no significant findings. Lungs/Pleura: Mild tree-in-bud  peribronchial nodularity within the right upper lobe is nonspecific. In the acute setting, this may reflect changes of acute bronchiolitis. Chronic, this may reflect changes of chronic atypical infection, such as mycobacterial or fungal pneumonia, or post inflammatory scarring. No confluent pulmonary infiltrate. No pneumothorax or pleural effusion. Musculoskeletal: Remote anterior wedge compression deformity T12 with approximately 50-60% loss of height anteriorly noted. No retropulsion. Osseous structures are otherwise age-appropriate. Respiratory motion artifact noted. No acute bone abnormality. CT ABDOMEN PELVIS FINDINGS Hepatobiliary: No focal liver abnormality is seen. No gallstones, gallbladder wall thickening, or biliary dilatation. Pancreas: Unremarkable Spleen: Unremarkable Adrenals/Urinary Tract: Adrenal glands are unremarkable. Kidneys are normal, without renal calculi, focal lesion, or hydronephrosis. Bladder is unremarkable. Stomach/Bowel: Stomach is within normal limits. Appendix absent. No evidence of bowel wall thickening, distention, or inflammatory changes. Vascular/Lymphatic: Aortic atherosclerosis. No enlarged abdominal or pelvic lymph nodes. Reproductive: Calcified involuted uterine fibroids noted within the uterus. The pelvic organs are otherwise unremarkable. Other: Mild diffuse subcutaneous body wall edema. No abdominal wall hernia. No abdominopelvic ascites. Musculoskeletal: Degenerative changes are seen within the lumbar spine. No acute bone abnormality. No lytic or blastic bone lesion. IMPRESSION: 1. No acute intrathoracic or intra-abdominal injury identified. 2. Extensive multi-vessel coronary artery calcification. 3. Morphologic changes in keeping with pulmonary arterial hypertension. 4.  Mild tree-in-bud peribronchial nodularity within the right upper lobe is nonspecific. In the acute setting, this may reflect changes of acute bronchiolitis. If chronic, this may reflect changes of chronic  atypical infection, such as mycobacterial or fungal pneumonia, or post inflammatory scarring. 5. Remote anterior wedge compression deformity T12 with approximately 50-60% loss of height anteriorly. No retropulsion. Aortic Atherosclerosis (ICD10-I70.0). Electronically Signed   By: Worthy Heads M.D.   On: 09/28/2023 00:49   CT Head Wo Contrast Result Date: 09/28/2023 CLINICAL DATA:  New onset headache. Hypoglycemia. Found unresponsive in the kitchen. EXAM: CT HEAD WITHOUT CONTRAST CT CERVICAL SPINE WITHOUT CONTRAST TECHNIQUE: Multidetector CT imaging of the head and cervical spine was performed following the standard protocol without intravenous contrast. Multiplanar CT image reconstructions of the cervical spine were also generated. RADIATION DOSE REDUCTION: This exam was performed according to the departmental dose-optimization program which includes automated exposure control, adjustment of the mA and/or kV according to patient size and/or use of iterative reconstruction technique. COMPARISON:  CT head 09/17/2023 and CT cervical spine 04/07/2022 FINDINGS: CT HEAD FINDINGS Brain: No intracranial hemorrhage, mass effect, or evidence of acute infarct. No hydrocephalus. No extra-axial fluid collection. Age related cerebral atrophy and chronic small vessel ischemic disease. Vascular: No hyperdense vessel. Intracranial arterial calcification. Skull: No fracture or focal lesion. Sinuses/Orbits: Near complete opacification of the right maxillary sinus with central hyperdense mucous. No retro antral fat stranding. The paranasal sinuses and mastoid air cells are otherwise well aerated. Globes are intact. Other: None. CT CERVICAL SPINE FINDINGS Alignment: No evidence of traumatic malalignment. Skull base and vertebrae: No acute fracture. No primary bone lesion or focal pathologic process. Soft tissues and spinal canal: No prevertebral fluid or swelling. No visible canal hematoma. Disc levels: Multilevel spondylosis and  disc space height loss greatest at C5-C6 and C6-C7. There is ankylosis of C5-C6. Mild multilevel facet arthropathy. No severe spinal canal narrowing. Upper chest: No acute abnormality. Other: Carotid calcification. IMPRESSION: 1. No acute intracranial abnormality. 2. No cervical spine fracture. 3. Near complete opacification of the right maxillary sinus with central hyperdense mucous. Correlate for sinusitis. Electronically Signed   By: Rozell Cornet M.D.   On: 09/28/2023 00:40   CT Cervical Spine Wo Contrast Result Date: 09/28/2023 CLINICAL DATA:  New onset headache. Hypoglycemia. Found unresponsive in the kitchen. EXAM: CT HEAD WITHOUT CONTRAST CT CERVICAL SPINE WITHOUT CONTRAST TECHNIQUE: Multidetector CT imaging of the head and cervical spine was performed following the standard protocol without intravenous contrast. Multiplanar CT image reconstructions of the cervical spine were also generated. RADIATION DOSE REDUCTION: This exam was performed according to the departmental dose-optimization program which includes automated exposure control, adjustment of the mA and/or kV according to patient size and/or use of iterative reconstruction technique. COMPARISON:  CT head 09/17/2023 and CT cervical spine 04/07/2022 FINDINGS: CT HEAD FINDINGS Brain: No intracranial hemorrhage, mass effect, or evidence of acute infarct. No hydrocephalus. No extra-axial fluid collection. Age related cerebral atrophy and chronic small vessel ischemic disease. Vascular: No hyperdense vessel. Intracranial arterial calcification. Skull: No fracture or focal lesion. Sinuses/Orbits: Near complete opacification of the right maxillary sinus with central hyperdense mucous. No retro antral fat stranding. The paranasal sinuses and mastoid air cells are otherwise well aerated. Globes are intact. Other: None. CT CERVICAL SPINE FINDINGS Alignment: No evidence of traumatic malalignment. Skull base and vertebrae: No acute fracture. No primary bone  lesion or focal pathologic process. Soft tissues and spinal canal: No prevertebral fluid or swelling. No visible canal hematoma.  Disc levels: Multilevel spondylosis and disc space height loss greatest at C5-C6 and C6-C7. There is ankylosis of C5-C6. Mild multilevel facet arthropathy. No severe spinal canal narrowing. Upper chest: No acute abnormality. Other: Carotid calcification. IMPRESSION: 1. No acute intracranial abnormality. 2. No cervical spine fracture. 3. Near complete opacification of the right maxillary sinus with central hyperdense mucous. Correlate for sinusitis. Electronically Signed   By: Rozell Cornet M.D.   On: 09/28/2023 00:40   DG Chest 2 View Result Date: 09/17/2023 CLINICAL DATA:  Chest pain. High blood pressure with dizziness, nausea, and headache. EXAM: CHEST - 2 VIEW COMPARISON:  12/09/2021 FINDINGS: Shallow inspiration. Cardiac enlargement with hazy perihilar and basilar infiltrates likely representing edema but could be pneumonia. No pleural effusion or pneumothorax. Mediastinal contours appear intact. Calcification of the aorta. IMPRESSION: Cardiac enlargement. Perihilar and basilar infiltrates suggesting edema or pneumonia. Electronically Signed   By: Boyce Byes M.D.   On: 09/17/2023 19:14   CT Head Wo Contrast Result Date: 09/17/2023 CLINICAL DATA:  Headache, new onset (Age >= 51y) EXAM: CT HEAD WITHOUT CONTRAST TECHNIQUE: Contiguous axial images were obtained from the base of the skull through the vertex without intravenous contrast. RADIATION DOSE REDUCTION: This exam was performed according to the departmental dose-optimization program which includes automated exposure control, adjustment of the mA and/or kV according to patient size and/or use of iterative reconstruction technique. COMPARISON:  CT head 04/07/2022 FINDINGS: Brain: No evidence of large-territorial acute infarction. No parenchymal hemorrhage. No mass lesion. No extra-axial collection. No mass effect or  midline shift. No hydrocephalus. Basilar cisterns are patent. Vascular: No hyperdense vessel. Atherosclerotic calcifications are present within the cavernous internal carotid and vertebral arteries. Skull: No acute fracture or focal lesion. Sinuses/Orbits: Paranasal sinuses and mastoid air cells are clear. Bilateral lens replacement. Otherwise the orbits are unremarkable. Other: None. IMPRESSION: No acute intracranial abnormality. Electronically Signed   By: Morgane  Naveau M.D.   On: 09/17/2023 19:12    Microbiology: Results for orders placed or performed during the hospital encounter of 09/27/23  Culture, blood (routine x 2)     Status: None   Collection Time: 09/27/23 11:48 PM   Specimen: BLOOD  Result Value Ref Range Status   Specimen Description BLOOD BLOOD RIGHT HAND  Final   Special Requests   Final    BOTTLES DRAWN AEROBIC ONLY Blood Culture results may not be optimal due to an inadequate volume of blood received in culture bottles   Culture   Final    NO GROWTH 5 DAYS Performed at [facility], [address] [place], [place] [address]    Report Status 10/02/2023 FINAL  Final  Culture, blood (routine x 2)     Status: None   Collection Time: 09/27/23 11:48 PM   Specimen: BLOOD  Result Value Ref Range Status   Specimen Description BLOOD LEFT ANTECUBITAL  Final   Special Requests   Final    BOTTLES DRAWN AEROBIC AND ANAEROBIC Blood Culture adequate volume   Culture   Final    NO GROWTH 5 DAYS Performed at [facility], [address] [place], [place] [address]    Report Status 10/02/2023 FINAL  Final  Resp panel by RT-PCR (RSV, Flu A&B, Covid) Anterior Nasal Swab     Status: None   Collection Time: 09/27/23 11:58 PM   Specimen: Anterior Nasal Swab  Result Value Ref Range Status   SARS Coronavirus 2 by RT PCR NEGATIVE NEGATIVE Final    Comment: (NOTE) SARS-CoV-2 target nucleic  acids are NOT DETECTED.  The SARS-CoV-2 RNA is generally detectable in upper respiratory specimens  during the acute phase of infection. The lowest concentration of SARS-CoV-2 viral copies this assay can detect is 138 copies/mL. A negative result does not preclude SARS-Cov-2 infection and should not be used as the sole basis for treatment or other patient management decisions. A negative result may occur with  improper specimen collection/handling, submission of specimen other than nasopharyngeal swab, presence of viral mutation(s) within the areas targeted by this assay, and inadequate number of viral copies(<138 copies/mL). A negative result must be combined with clinical observations, patient history, and epidemiological information. The expected result is Negative.  Fact Sheet for Patients:  BloggerCourse.com  Fact Sheet for Healthcare Providers:  SeriousBroker.it  This test is no t yet approved or cleared by the [place]  FDA and  has been authorized for detection and/or diagnosis of SARS-CoV-2 by FDA under an Emergency Use Authorization (EUA). This EUA will remain  in effect (meaning this test can be used) for the duration of the COVID-19 declaration under Section 564(b)(1) of the Act, 21 U.S.C.section 360bbb-3(b)(1), unless the authorization is terminated  or revoked sooner.       Influenza A by PCR NEGATIVE NEGATIVE Final   Influenza B by PCR NEGATIVE NEGATIVE Final    Comment: (NOTE) The Xpert Xpress SARS-CoV-2/FLU/RSV plus assay is intended as an aid in the diagnosis of influenza from Nasopharyngeal swab specimens and should not be used as a sole basis for treatment. Nasal washings and aspirates are unacceptable for Xpert Xpress SARS-CoV-2/FLU/RSV testing.  Fact Sheet for Patients: BloggerCourse.com  Fact Sheet for Healthcare Providers: SeriousBroker.it  This test is not yet approved or cleared by the [place]  FDA and has been authorized for detection  and/or diagnosis of SARS-CoV-2 by FDA under an Emergency Use Authorization (EUA). This EUA will remain in effect (meaning this test can be used) for the duration of the COVID-19 declaration under Section 564(b)(1) of the Act, 21 U.S.C. section 360bbb-3(b)(1), unless the authorization is terminated or revoked.     Resp Syncytial Virus by PCR NEGATIVE NEGATIVE Final    Comment: (NOTE) Fact Sheet for Patients: BloggerCourse.com  Fact Sheet for Healthcare Providers: SeriousBroker.it  This test is not yet approved or cleared by the [place]  FDA and has been authorized for detection and/or diagnosis of SARS-CoV-2 by FDA under an Emergency Use Authorization (EUA). This EUA will remain in effect (meaning this test can be used) for the duration of the COVID-19 declaration under Section 564(b)(1) of the Act, 21 U.S.C. section 360bbb-3(b)(1), unless the authorization is terminated or revoked.  Performed at [facility], [address] [place], [place] [address]   MRSA Next Gen by PCR, Nasal     Status: None   Collection Time: 09/28/23  5:40 AM   Specimen: Nasal Mucosa; Nasal Swab  Result Value Ref Range Status   MRSA by PCR Next Gen NOT DETECTED NOT DETECTED Final    Comment: (NOTE) The GeneXpert MRSA Assay (FDA approved for NASAL specimens only), is one component of a comprehensive MRSA colonization surveillance program. It is not intended to diagnose MRSA infection nor to guide or monitor treatment for MRSA infections. Test performance is not FDA approved in patients less than 87 years old. Performed at [facility], [address], [place], [place] [address]   Respiratory (~20 pathogens) panel by PCR     Status: None   Collection Time: 09/28/23  8:50 AM   Specimen: Nasopharyngeal  Swab; Respiratory  Result Value Ref Range Status   Adenovirus NOT DETECTED NOT DETECTED Final   Coronavirus 229E NOT DETECTED NOT DETECTED Final     Comment: (NOTE) The Coronavirus on the Respiratory Panel, DOES NOT test for the novel  Coronavirus (2019 nCoV)    Coronavirus HKU1 NOT DETECTED NOT DETECTED Final   Coronavirus NL63 NOT DETECTED NOT DETECTED Final   Coronavirus OC43 NOT DETECTED NOT DETECTED Final   Metapneumovirus NOT DETECTED NOT DETECTED Final   Rhinovirus / Enterovirus NOT DETECTED NOT DETECTED Final   Influenza A NOT DETECTED NOT DETECTED Final   Influenza B NOT DETECTED NOT DETECTED Final   Parainfluenza Virus 1 NOT DETECTED NOT DETECTED Final   Parainfluenza Virus 2 NOT DETECTED NOT DETECTED Final   Parainfluenza Virus 3 NOT DETECTED NOT DETECTED Final   Parainfluenza Virus 4 NOT DETECTED NOT DETECTED Final   Respiratory Syncytial Virus NOT DETECTED NOT DETECTED Final   Bordetella pertussis NOT DETECTED NOT DETECTED Final   Bordetella Parapertussis NOT DETECTED NOT DETECTED Final   Chlamydophila pneumoniae NOT DETECTED NOT DETECTED Final   Mycoplasma pneumoniae NOT DETECTED NOT DETECTED Final    Comment: Performed at [facility], [address] [place], [place] [address]    Labs: CBC: Recent Labs  Lab 10/11/23 1836 10/12/23 0520 10/13/23 0515  WBC 3.9* 5.0 4.7  NEUTROABS 2.0  --   --   HGB 13.3 10.9* 11.0*  HCT 37.7 31.9* 32.2*  MCV 80.9 81.6 81.9  PLT 190 152 149*   Basic Metabolic Panel: Recent Labs  Lab 10/11/23 1836 10/12/23 0520 10/13/23 0515  NA 138 136 139  K 3.6 3.2* 4.6  CL 102 102 104  CO2 27 25 30   GLUCOSE 75 136* 104*  BUN 31* 29* 32*  CREATININE 1.99* 1.70* 2.06*  CALCIUM  9.5 8.8* 9.2  MG  --  2.2  --   PHOS  --  3.5  --    Liver Function Tests: Recent Labs  Lab 10/12/23 0520  AST 29  ALT 21  ALKPHOS 58  BILITOT 0.7  PROT 5.9*  ALBUMIN 3.0*   CBG: Recent Labs  Lab 10/12/23 1114 10/12/23 1631 10/12/23 2107 10/13/23 0708 10/13/23 1112  GLUCAP 189* 293* 298* 99 204*    Discharge time spent: greater than 30 minutes.  Signed: Bobbetta Burnet,  MD [facility] 10/13/2023

## 2023-10-15 ENCOUNTER — Encounter: Payer: Self-pay | Admitting: Adult Health

## 2023-10-15 ENCOUNTER — Non-Acute Institutional Stay (SKILLED_NURSING_FACILITY): Payer: Self-pay | Admitting: Adult Health

## 2023-10-15 DIAGNOSIS — Z794 Long term (current) use of insulin: Secondary | ICD-10-CM

## 2023-10-15 DIAGNOSIS — D509 Iron deficiency anemia, unspecified: Secondary | ICD-10-CM

## 2023-10-15 DIAGNOSIS — S82192S Other fracture of upper end of left tibia, sequela: Secondary | ICD-10-CM | POA: Diagnosis not present

## 2023-10-15 DIAGNOSIS — S82832S Other fracture of upper and lower end of left fibula, sequela: Secondary | ICD-10-CM

## 2023-10-15 DIAGNOSIS — I4819 Other persistent atrial fibrillation: Secondary | ICD-10-CM | POA: Diagnosis not present

## 2023-10-15 DIAGNOSIS — K5909 Other constipation: Secondary | ICD-10-CM | POA: Diagnosis not present

## 2023-10-15 DIAGNOSIS — I130 Hypertensive heart and chronic kidney disease with heart failure and stage 1 through stage 4 chronic kidney disease, or unspecified chronic kidney disease: Secondary | ICD-10-CM | POA: Diagnosis not present

## 2023-10-15 DIAGNOSIS — I25110 Atherosclerotic heart disease of native coronary artery with unstable angina pectoris: Secondary | ICD-10-CM | POA: Diagnosis not present

## 2023-10-15 DIAGNOSIS — I700 Atherosclerosis of aorta: Secondary | ICD-10-CM | POA: Diagnosis not present

## 2023-10-15 DIAGNOSIS — E1122 Type 2 diabetes mellitus with diabetic chronic kidney disease: Secondary | ICD-10-CM

## 2023-10-15 DIAGNOSIS — E785 Hyperlipidemia, unspecified: Secondary | ICD-10-CM

## 2023-10-15 DIAGNOSIS — E1169 Type 2 diabetes mellitus with other specified complication: Secondary | ICD-10-CM | POA: Diagnosis not present

## 2023-10-15 DIAGNOSIS — I5032 Chronic diastolic (congestive) heart failure: Secondary | ICD-10-CM

## 2023-10-15 DIAGNOSIS — N1832 Chronic kidney disease, stage 3b: Secondary | ICD-10-CM

## 2023-10-15 NOTE — Progress Notes (Unsigned)
 Location:  [facility] Room Number: [id number] Place of Service:  SNF (31)   CODE STATUS: full   Allergies  Allergen Reactions   Penicillins Hives    Chief Complaint  Patient presents with   Hospitalization Follow-up    HPI:  She is a 78 year old woman who has been hospitalized from 10-11-23 through 10-13-23. Her past medical history includes: atrial fibrillations; type 2 diabetes mellitus; CAD/STEMI (07/2021). She presented to the ED due t a fall. She was attempting to get into car in a parking lot. Her left leg gave way. She did hit her head without losing consciousness. Left tibia and fibula x-ray showed acute displaced proximal tibia metadiaphysis fracture.  Acute displaced fibular head/neck fracture.  No acute displaced fracture or dislocation of the left hip.her case was discussed with Dr. Phyllis Breeze, no surgical intervention needed; has splint in place.  She is here for short term rehab with her goal to return back home. She will continue to be followed for her chronic illnesses including:  Chronic diastolic heart failure with preserved ejection fracture: Hypertension with congestive heart failure and renal failure:Aortic atherosclerosis   Past Medical History:  Diagnosis Date   (HFpEF) heart failure with preserved ejection fraction (HCC)    Limited Echo 7/22: Mild asymmetrical LVH due to scar and thinning of basal posterior wall and background conc LVH, apical and mid segments hyperdynamic, EF 60-65, basal inf-lat and basal inf AK, normal RVSF, RVSP 26.6, mild LAE, mild-moderate MR, mild AV sclerosis w/o AS // Echo 5/22: EF 55-60, RVSP 27, mild to mod LAE, poss small to mod eff; AV sclerosis no AS   Arthritis    "all over"   CAD (coronary artery disease)    a. NSTEMI 12/2013 - occluded dominant RCA with L-R collaterals, moderate prox segmental LAD disease, for medical therapy initially, EF 60% with subtle inferobasal hypokinesia. Consider PCI for refractory CP.    CKD (chronic kidney disease), stage III (HCC)    Hypertension    Migraines    "weekly" (01/06/2014)   Sickle cell trait (HCC)    TIA (transient ischemic attack) 1978   Type II diabetes mellitus (HCC)    Vertigo     Past Surgical History:  Procedure Laterality Date   APPENDECTOMY  March 2014   had appendix frozen   CARDIAC CATHETERIZATION  "years ago" & 01/05/2014   CARDIOVERSION N/A 03/17/2014   Procedure: CARDIOVERSION;  Surgeon: Hazle Lites, MD;  Location: [facility] ENDOSCOPY;  Service: Cardiovascular;  Laterality: N/A;   CARDIOVERSION N/A 01/14/2021   Procedure: CARDIOVERSION;  Surgeon: Hazle Lites, MD;  Location: [facility] ENDOSCOPY;  Service: Cardiovascular;  Laterality: N/A;   CARDIOVERSION N/A 12/13/2021   Procedure: CARDIOVERSION;  Surgeon: Lake Pilgrim, MD;  Location: [facility] ENDOSCOPY;  Service: Cardiovascular;  Laterality: N/A;   CATARACT EXTRACTION W/ INTRAOCULAR LENS  IMPLANT, BILATERAL Bilateral 04/2013-05/2013   CORONARY ATHERECTOMY N/A 08/05/2021   Procedure: CORONARY ATHERECTOMY;  Surgeon: Arnoldo Lapping, MD;  Location: [facility] INVASIVE CV LAB;  Service: Cardiovascular;  Laterality: N/A;   CORONARY PRESSURE/FFR STUDY N/A 08/04/2021   Procedure: INTRAVASCULAR PRESSURE WIRE/FFR STUDY;  Surgeon: Sammy Crisp, MD;  Location: [facility] INVASIVE CV LAB;  Service: Cardiovascular;  Laterality: N/A;   CORONARY STENT INTERVENTION N/A 08/05/2021   Procedure: CORONARY STENT INTERVENTION;  Surgeon: Arnoldo Lapping, MD;  Location: [facility] INVASIVE CV LAB;  Service: Cardiovascular;  Laterality: N/A;   ENDARTERECTOMY Right 02/05/2017   Procedure: ENDARTERECTOMY CAROTID-RIGHT;  Surgeon: Stacia Dynes  E, MD;  Location: [facility] OR;  Service: Vascular;  Laterality: Right;   ESOPHAGOGASTRODUODENOSCOPY  11/08/2007    Normal esophagus without evidence of Barrett, mass, erosion/ Normal stomach, duodenal bulb   ESOPHAGOGASTRODUODENOSCOPY (EGD) WITH PROPOFOL  N/A 11/08/2020   Procedure: ESOPHAGOGASTRODUODENOSCOPY (EGD) WITH  PROPOFOL ;  Surgeon: Lindle Rhea, MD;  Location: [facility] ENDOSCOPY;  Service: Gastroenterology;  Laterality: N/A;   GASTRIC MOTILITY STUDY  11/13/2007   mildly delayed emptying subjectively, but normal  analysis-77% of tracer emptied at 2 hours   JOINT REPLACEMENT     LAPAROSCOPIC APPENDECTOMY N/A 09/15/2012   Procedure: APPENDECTOMY LAPAROSCOPIC;  Surgeon: Beau Bound, MD;  Location: [facility] ORS;  Service: General;  Laterality: N/A;   LEFT HEART CATH AND CORONARY ANGIOGRAPHY N/A 08/04/2021   Procedure: LEFT HEART CATH AND CORONARY ANGIOGRAPHY;  Surgeon: Sammy Crisp, MD;  Location: [facility] INVASIVE CV LAB;  Service: Cardiovascular;  Laterality: N/A;   LEFT HEART CATHETERIZATION WITH CORONARY ANGIOGRAM N/A 01/05/2014   Procedure: LEFT HEART CATHETERIZATION WITH CORONARY ANGIOGRAM;  Surgeon: Avanell Leigh, MD;  Location: [facility] CATH LAB;  Service: Cardiovascular;  Laterality: N/A;   REPLACEMENT TOTAL KNEE Right 05-03-06   TEE WITHOUT CARDIOVERSION N/A 03/17/2014   Procedure: TRANSESOPHAGEAL ECHOCARDIOGRAM (TEE);  Surgeon: Hazle Lites, MD;  Location: [facility] ENDOSCOPY;  Service: Cardiovascular;  Laterality: N/A;   TONSILLECTOMY  1972    Social History   Socioeconomic History   Marital status: Single    Spouse name: Not on file   Number of children: Not on file   Years of education: Not on file   Highest education level: Not on file  Occupational History   Occupation: retired  Tobacco Use   Smoking status: Never   Smokeless tobacco: Never  Vaping Use   Vaping status: Never Used  Substance and Sexual Activity   Alcohol use: Yes    Comment: rare   Drug use: No   Sexual activity: Not Currently    Birth control/protection: Post-menopausal  Other Topics Concern   Not on file  Social History Narrative   Not on file   Social Drivers of Health   Financial Resource Strain: Not on file  Food Insecurity: No Food Insecurity (10/12/2023)   Hunger Vital Sign    Worried About Running Out of Food in  the Last Year: Never true    Ran Out of Food in the Last Year: Never true  Transportation Needs: Patient Declined (10/12/2023)   PRAPARE - Administrator, Civil Service (Medical): Patient declined    Lack of Transportation (Non-Medical): Patient declined  Physical Activity: Not on file  Stress: Not on file  Social Connections: Unknown (10/12/2023)   Social Connection and Isolation Panel [NHANES]    Frequency of Communication with Friends and Family: Three times a week    Frequency of Social Gatherings with Friends and Family: Once a week    Attends Religious Services: 1 to 4 times per year    Active Member of [employer] or Organizations: Patient declined    Attends Banker Meetings: Patient declined    Marital Status: Separated  Recent Concern: Social Connections - Socially Isolated (09/28/2023)   Social Connection and Isolation Panel [NHANES]    Frequency of Communication with Friends and Family: Three times a week    Frequency of Social Gatherings with Friends and Family: Once a week    Attends Religious Services: Never    Database administrator or Organizations: No    Attends Club or  Organization Meetings: Never    Marital Status: Separated  Intimate Partner Violence: Patient Declined (10/12/2023)   Humiliation, Afraid, Rape, and Kick questionnaire    Fear of Current or Ex-Partner: Patient declined    Emotionally Abused: Patient declined    Physically Abused: Patient declined    Sexually Abused: Patient declined   Family History  Problem Relation Age of Onset   Hyperlipidemia Mother    Hypertension Mother    Heart attack Mother    Cancer Father        lung   Hypertension Sister    Diabetes Brother    Heart disease Brother    Hyperlipidemia Brother    Hypertension Brother    Heart attack Brother    Other Brother        DVT      VITAL SIGNS BP (!) 109/59   Pulse (!) 58   Temp (!) 97.4 F (36.3 C)   Resp 18   Ht 5\' 6"  (1.676 m)   Wt 169 lb 3.2 oz  (76.7 kg)   SpO2 95%   BMI 27.31 kg/m   Outpatient Encounter Medications as of 10/15/2023  Medication Sig   acetaminophen  (TYLENOL ) 650 MG CR tablet Take 650 mg by mouth every 6 (six) hours as needed for pain.   polyethylene glycol (MIRALAX  / GLYCOLAX ) 17 g packet Take 17 g by mouth daily.   amLODipine  (NORVASC ) 10 MG tablet Take 1 tablet (10 mg total) by mouth daily.   atorvastatin  (LIPITOR ) 80 MG tablet Take 1 tablet (80 mg total) by mouth every evening.   carvedilol  (COREG ) 3.125 MG tablet Take 1 tablet (3.125 mg total) by mouth 2 (two) times daily with a meal.   ELIQUIS  5 MG TABS tablet TAKE (1) TABLET BY MOUTH TWICE DAILY.   empagliflozin  (JARDIANCE ) 10 MG TABS tablet Take 1 tablet (10 mg total) by mouth daily.   furosemide  (LASIX ) 40 MG tablet Take 1 tablet (40 mg total) by mouth daily.   insulin  aspart (NOVOLOG  FLEXPEN) 100 UNIT/ML FlexPen Max daily 30 units (Patient taking differently: 6 Units 3 (three) times daily with meals.)   Insulin  Glargine (BASAGLAR  KWIKPEN) 100 UNIT/ML Inject 22 Units into the skin daily.   isosorbide  mononitrate (IMDUR ) 60 MG 24 hr tablet Take 60 mg by mouth daily.   methocarbamol  (ROBAXIN ) 500 MG tablet Take 1 tablet (500 mg total) by mouth 3 (three) times daily for 10 days.   nitroGLYCERIN  (NITROSTAT ) 0.4 MG SL tablet PLACE 1 TABLET UNDER THE TONGUE EVERY 5 MINUTES AS NEEDED FOR CHEST PAIN (UP TO 3 DOSES)   oxyCODONE  (OXY IR/ROXICODONE ) 5 MG immediate release tablet Take 1 tablet (5 mg total) by mouth every 4 (four) hours as needed for up to 3 days for moderate pain (pain score 4-6) or severe pain (pain score 7-10).   sacubitril -valsartan  (ENTRESTO ) 49-51 MG TAKE (1) TABLET BY MOUTH TWICE DAILY.   [DISCONTINUED] acetaminophen  (TYLENOL ) 500 MG tablet Take 2 tablets (1,000 mg total) by mouth every 8 (eight) hours.   No facility-administered encounter medications on file as of 10/15/2023.     SIGNIFICANT DIAGNOSTIC EXAMS  TODAY  09-28-23: hgb A1c  7.8 10-12-23: wbc 5.0; hgb 10.9; hct 31.9 mcv 81.6 plt 152; glucose 136; bun 29; creat 1.70; k+ 3.2; na++ 136; ca 8.8; gfr 30; protein 5.9 albumin 3.0; mag 2.2 10-13-23: wbc 4.7; hgb 11.0; hct 32.2; mcv 81.9 plt 149; glucose 104; bun 32; creat 2.06; k+ 4.6; na++ 139; ca 9.2 gfr 24; BNP  268  Review of Systems  Constitutional:  Negative for malaise/fatigue.  Respiratory:  Negative for cough and shortness of breath.   Cardiovascular:  Negative for chest pain, palpitations and leg swelling.  Gastrointestinal:  Negative for abdominal pain, constipation and heartburn.  Musculoskeletal:  Negative for back pain, joint pain and myalgias.  Skin: Negative.   Neurological:  Negative for dizziness.  Psychiatric/Behavioral:  The patient is not nervous/anxious.    Physical Exam Constitutional:      General: She is not in acute distress.    Appearance: She is well-developed. She is not diaphoretic.  Neck:     Thyroid : No thyromegaly.  Cardiovascular:     Rate and Rhythm: Normal rate and regular rhythm.     Pulses: Normal pulses.     Heart sounds: Normal heart sounds.  Pulmonary:     Effort: Pulmonary effort is normal. No respiratory distress.     Breath sounds: Normal breath sounds.  Abdominal:     General: Bowel sounds are normal. There is no distension.     Palpations: Abdomen is soft.     Tenderness: There is no abdominal tenderness.  Musculoskeletal:     Cervical back: Neck supple.     Right lower leg: Edema present.     Left lower leg: Edema present.     Comments: Left leg in splint   Lymphadenopathy:     Cervical: No cervical adenopathy.  Skin:    General: Skin is warm and dry.  Neurological:     Mental Status: She is alert and oriented to person, place, and time.  Psychiatric:        Mood and Affect: Mood normal.    ASSESSMENT/ PLAN:  TODAY  Closed fracture of neck of left fibula sequela/other closed fracture of proximal end of left tibia sequela: is nonweight bearing. Will  continue therapy as directed to improve upon her level of independence of adls. Will follow up with orthopedics. Will continue oxycodone  5 mg every 6 hours as needed through 10-16-23; tylenol  650 mg every 6 hours as needed. Has robaxin  500 mg three times daily for 10 days.   2. Chronic diastolic heart failure with preserved ejection fracture: will continue lasix  40 mg daily; imdur  60 mg daily; coreg  3.125 mg twice daily; entresto  49/51 mg twice daily   3. Hypertension with congestive heart failure and renal failure: b/p 109/59: will continue norvasc  10 mg daily; coreg  3.125 mg twice daily.   4. Aortic atherosclerosis (ct 09-27-23) is on statin  5. Persistent atrial fibrillation: heart rate is stable; will continue coreg  3.125 mg twice daily for rate control; is on eliquis  5 mg twice daily   6. Coronary artery disease involving native coronary artery of native heart with unstable angina pectoris: will continue imdur  60 mg daily; coreg  3.125 mg twice daily; has prn ntg  7. Type 2 diabetes mellitus with stage 3b chronic kidney disease with long term current use of insulin : hgb A1c 7.8; will continue jardiance  10 mg daily; basaglar  22 units nightly; aspart 6 units three times daily   8. Hyperlipidemia associated with type 2 diabetes mellitus: will continue lipitor  80 mg daily   9. Stage 3b chronic kidney disease due to type 2 diabetes mellitus: bun 32; creat 2.06 gfr 24  10. Microcytic anemia: hgb 11.0  11. Chronic constipation: will continue miralax  daily    Britt Candle NP [facility]  call [phone]

## 2023-10-16 DIAGNOSIS — I700 Atherosclerosis of aorta: Secondary | ICD-10-CM | POA: Insufficient documentation

## 2023-10-16 DIAGNOSIS — E1169 Type 2 diabetes mellitus with other specified complication: Secondary | ICD-10-CM | POA: Insufficient documentation

## 2023-10-16 DIAGNOSIS — E1122 Type 2 diabetes mellitus with diabetic chronic kidney disease: Secondary | ICD-10-CM | POA: Insufficient documentation

## 2023-10-16 DIAGNOSIS — K5909 Other constipation: Secondary | ICD-10-CM | POA: Insufficient documentation

## 2023-10-16 DIAGNOSIS — I130 Hypertensive heart and chronic kidney disease with heart failure and stage 1 through stage 4 chronic kidney disease, or unspecified chronic kidney disease: Secondary | ICD-10-CM | POA: Insufficient documentation

## 2023-10-18 ENCOUNTER — Encounter: Payer: Self-pay | Admitting: Internal Medicine

## 2023-10-18 ENCOUNTER — Non-Acute Institutional Stay (SKILLED_NURSING_FACILITY): Payer: Self-pay | Admitting: Internal Medicine

## 2023-10-18 DIAGNOSIS — I5032 Chronic diastolic (congestive) heart failure: Secondary | ICD-10-CM | POA: Diagnosis not present

## 2023-10-18 DIAGNOSIS — S82192S Other fracture of upper end of left tibia, sequela: Secondary | ICD-10-CM

## 2023-10-18 DIAGNOSIS — S82832S Other fracture of upper and lower end of left fibula, sequela: Secondary | ICD-10-CM | POA: Diagnosis not present

## 2023-10-18 DIAGNOSIS — E1122 Type 2 diabetes mellitus with diabetic chronic kidney disease: Secondary | ICD-10-CM

## 2023-10-18 DIAGNOSIS — I10 Essential (primary) hypertension: Secondary | ICD-10-CM

## 2023-10-18 DIAGNOSIS — N1832 Chronic kidney disease, stage 3b: Secondary | ICD-10-CM

## 2023-10-18 NOTE — Patient Instructions (Addendum)
 See assessment and plan under each diagnosis in the problem list and acutely for this visit:  Closed fracture of left proximal tibia PT/OT and SNF as tolerated.  Closed fracture of neck of fibula PT/OT and SNF as tolerated.  Stage 3b chronic kidney disease due to type 2 diabetes mellitus [facility]) Hospitalized 4/17 - 10/13/2023  with tib/fib fractures.Glucoses ranged from a low of 75 up to 236.  A1c was 7.8%; this is adequate goal in view of her age and advanced comorbidities.  Critical is to avoid hypoglycemia which would mimic TIA. Serially over the last 9 months creatinine has ranged from a low of 1.63 up to a high of 2.59.  Most recent creatinine is 2.06 and GFR 24, this is compatible with CKD stage IV.  Med list reviewed & UpToDate referenced.  If GFR is less than 30 mL Entresto  dose should be less than 97/103 mg twice a day.  Jardiance  dose does not need adjustment as long as the GFR is greater than 20.  Chronic heart failure with preserved ejection fraction (HFpEF) (HCC) CHF clinically compensated; no NVD or significant peripheral edema. No change in present cardiac regimen indicated.   Essential hypertension Today's systolic blood pressure is mildly elevated; monitor will continue and average determined.  Antihypertensive management will be directed by that average, not outliers.

## 2023-10-18 NOTE — Progress Notes (Signed)
 NURSING HOME LOCATION:  [facility] ROOM NUMBER:  119 P  CODE STATUS:  Full Code  PCP:  Minus Amel MD  This is a comprehensive admission note to this SNFperformed on this date less than 30 days from date of admission. Included are preadmission medical/surgical history; reconciled medication list; family history; social history and comprehensive review of systems.  Corrections and additions to the records were documented. Comprehensive physical exam was also performed. Additionally a clinical summary was entered for each active diagnosis pertinent to this admission in the Problem List to enhance continuity of care.  HPI: Patient was hospitalized 4/17 - 10/13/2023 with closed fracture of the left proximal tibia.  She states she had gone to [employer] to get her eyeglasses fixed.  Upon trying to get into the car to leave,her left lower extremity "gave way" resulting in a fall.  She states this is in the context of weakness of left lower extremity for at least 4 to 5 months.  She made a statement that "muscles caved in on the outside."  There was no cardiac or neurologic prodrome prior to the fall. In the ED imaging revealed acute displaced proximal tibia metadiaphysis fracture and acute displaced fibular head/neck fracture.  She did hit her head when she fell ; CT of the imaging revealed no acute intracranial abnormality. Orthopedist Dr. Phyllis Breeze recommended nonsurgical management.  SNF placement or rehab was recommended. Labs revealed CKD stage IV with creatinine of 2.06 and GFR of 24.  Serially over the last 9 months creatinine has ranged from a low of 1.63 up to a high of 2.59.  GFRs on record range from a low of 23 up to a high of 32.  Normochromic, normocytic anemia is present with H/H of 11/32.2.  Over the last 9 months there has been intermittent anemia with the present values representing a nadir.  Protein/caloric malnutrition suggested by an albumin of 3 and total protein of  5.9.  Current A1c 7.8%; while hospitalized glucoses ranged from a low of 75 up to 236. She was felt to be stable enough for discharge from SNF for rehab.  Past medical and surgical history: Includes history of heart failure with preserved ejection fraction; CAD; sickle cell trait; history of TIA; diabetes with CKD stage III; essential hypertension, history of migraines. Surgeries and procedures include cardioversion, cardiac catheterization, coronary atherectomy, coronary artery stenting, right carotid endarterectomy, EGD, and total knee replacement.  Family history: reviewed, non contributory due to advanced age.  Social history: Rare alcohol intake; non-smoker.   Review of systems: At this time she is unable to bear weight on the left lower extremity because of pain related to the fractures.  She describes tingling in the right hand.  She has chronic bilateral frontal headache associated with "swimmy headedness."  The swimmy headedness is present even when the headache is not present.  She has been having the headaches for approximately 8 months and these can last 2 to 3 days @ a time.  She describes them as throbbing; Tylenol  has not been of any benefit.  She can be headache free for 2 to 3 weeks at a time.  She states that neurologic evaluation suggested that headaches are related to "my eyes & my sugars."  Headaches are worse with light exposure. Despite her significant cardiovascular history , she denies active symptoms.  Constitutional: No fever, significant weight change, fatigue  Eyes: No redness, discharge, pain, vision change ENT/mouth: No nasal congestion, purulent discharge, earache, change  in hearing, sore throat  Cardiovascular: No chest pain, palpitations, paroxysmal nocturnal dyspnea, claudication, edema  Respiratory: No cough, sputum production, hemoptysis, DOE, significant snoring, apnea Gastrointestinal: No heartburn, dysphagia, abdominal pain, nausea /vomiting, rectal bleeding,  melena, change in bowels Genitourinary: No dysuria, hematuria, pyuria, incontinence, nocturia Dermatologic: No rash, pruritus, change in appearance of skin Neurologic: No syncope, seizures Psychiatric: No significant anxiety, depression, insomnia, anorexia Endocrine: No change in hair/skin/nails, excessive thirst, excessive hunger, excessive urination  Hematologic/lymphatic: No significant bruising, lymphadenopathy, abnormal bleeding Allergy/immunology: No itchy/watery eyes, significant sneezing, urticaria, angioedema  Physical exam:  Pertinent or positive findings: When initially seen in she was sitting in a wheelchair in the dark as she states light aggravates her headaches.  She has slight ptosis on the left.  She is edentulous.  There is slight asymmetry of the thyroid  with the right lobe slightly larger than the left.  1st and 2nd heart sounds are increased.  She has 1/2+ edema at the sock line.  Pulses are not palpable.  Interosseous wasting of the hands are noted.  There is a well-healed vertical operative scar of the right knee.  She is wearing a brace on the left lower extremity.  General appearance: Adequately nourished; no acute distress, increased work of breathing is present.   Lymphatic: No lymphadenopathy about the head, neck, axilla. Eyes: No conjunctival inflammation or lid edema is present. There is no scleral icterus. Ears:  External ear exam shows no significant lesions or deformities.   Nose:  External nasal examination shows no deformity or inflammation. Nasal mucosa are pink and moist without lesions, exudates Oral exam: Lips and gums are healthy appearing.There is no oropharyngeal erythema or exudate. Neck:  No masses, tenderness noted.    Heart:  Normal rate and regular rhythm  without gallop, murmur, click, rub.  Lungs: Chest clear to auscultation without wheezes, rhonchi, rales, rubs. Abdomen: Bowel sounds are normal.  Abdomen is soft and nontender with no  organomegaly, hernias, masses. GU: Deferred  Extremities:  No cyanosis, clubbing. Neurologic exam:Balance, Rhomberg, finger to nose testing could not be completed due to clinical state Skin: Warm & dry w/o tenting. No significant lesions or rash.  See clinical summary under each active problem in the Problem List with associated updated therapeutic plan

## 2023-10-19 ENCOUNTER — Encounter: Payer: Self-pay | Admitting: Internal Medicine

## 2023-10-19 NOTE — Assessment & Plan Note (Addendum)
 Today's systolic blood pressure is mildly elevated; monitor will continue and average determined.  Antihypertensive management will be directed by that average, not outliers.

## 2023-10-19 NOTE — Assessment & Plan Note (Addendum)
 Hospitalized 4/17 - 10/13/2023  with tib/fib fractures.Glucoses ranged from a low of 75 up to 236.  A1c was 7.8%; this is adequate goal in view of her age and advanced comorbidities.  Critical is to avoid hypoglycemia which would mimic TIA. Serially over the last 9 months creatinine has ranged from a low of 1.63 up to a high of 2.59.  Most recent creatinine is 2.06 and GFR 24, this is compatible with CKD stage IV.  Med list reviewed & UpToDate referenced.  If GFR is less than 30 mL Entresto  dose should be less than 97/103 mg twice a day.  Jardiance  dose does not need adjustment as long as the GFR is greater than 20.

## 2023-10-19 NOTE — Assessment & Plan Note (Addendum)
CHF clinically compensated; no NVD or significant peripheral edema. No change in present cardiac regimen indicated.

## 2023-10-19 NOTE — Assessment & Plan Note (Signed)
 PT/OT and SNF as tolerated.

## 2023-10-26 ENCOUNTER — Encounter: Payer: Self-pay | Admitting: Adult Health

## 2023-10-26 ENCOUNTER — Non-Acute Institutional Stay (SKILLED_NURSING_FACILITY): Payer: Self-pay | Admitting: Adult Health

## 2023-10-26 DIAGNOSIS — I700 Atherosclerosis of aorta: Secondary | ICD-10-CM

## 2023-10-26 DIAGNOSIS — G8191 Hemiplegia, unspecified affecting right dominant side: Secondary | ICD-10-CM | POA: Diagnosis not present

## 2023-10-26 DIAGNOSIS — I5022 Chronic systolic (congestive) heart failure: Secondary | ICD-10-CM | POA: Diagnosis not present

## 2023-10-26 NOTE — Progress Notes (Signed)
 Location:  [facility] Room Number: 130 Place of Service:  SNF (31)   CODE STATUS: full   Allergies  Allergen Reactions   Penicillins Hives    Chief Complaint  Patient presents with   Acute Visit    Care plan meeting.     HPI:  We have come together for her care plan meeting. BCAT 20/50; BIMS 15/15 mood 0/30.she is using wheelchair without further falls. She requires moderate to dependent assist with adl care. She is occasionally incontinent of bladder and frequently incontinent of bowel. Dietary: D3 diet; feeds self; good appetite; weight is 168 pounds. Therapy: ambulate 16 feet with rolling walker with min assist; upper body supervision; lower body mod assist; transfers min to mod assist; brp: mod assist; no steps toe touch weight bearing. She will continue to be followed for her chronic illnesses including: Aortic atherosclerosis  Chronic systolic CHF (congestive heart failure)  Right hemiparesis   Past Medical History:  Diagnosis Date   (HFpEF) heart failure with preserved ejection fraction (HCC)    Limited Echo 7/22: Mild asymmetrical LVH due to scar and thinning of basal posterior wall and background conc LVH, apical and mid segments hyperdynamic, EF 60-65, basal inf-lat and basal inf AK, normal RVSF, RVSP 26.6, mild LAE, mild-moderate MR, mild AV sclerosis w/o AS // Echo 5/22: EF 55-60, RVSP 27, mild to mod LAE, poss small to mod eff; AV sclerosis no AS   Arthritis    "all over"   CAD (coronary artery disease)    a. NSTEMI 12/2013 - occluded dominant RCA with L-R collaterals, moderate prox segmental LAD disease, for medical therapy initially, EF 60% with subtle inferobasal hypokinesia. Consider PCI for refractory CP.   CKD (chronic kidney disease), stage III (HCC)    Hypertension    Migraines    "weekly" (01/06/2014)   Sickle cell trait (HCC)    TIA (transient ischemic attack) 1978   Type II diabetes mellitus (HCC)    Vertigo     Past Surgical  History:  Procedure Laterality Date   APPENDECTOMY  08/24/2012   had appendix frozen   CARDIAC CATHETERIZATION  "years ago" & 01/05/2014   CARDIOVERSION N/A 03/17/2014   Procedure: CARDIOVERSION;  Surgeon: Hazle Lites, MD;  Location: [facility] ENDOSCOPY;  Service: Cardiovascular;  Laterality: N/A;   CARDIOVERSION N/A 01/14/2021   Procedure: CARDIOVERSION;  Surgeon: Hazle Lites, MD;  Location: [facility] ENDOSCOPY;  Service: Cardiovascular;  Laterality: N/A;   CARDIOVERSION N/A 12/13/2021   Procedure: CARDIOVERSION;  Surgeon: Lake Pilgrim, MD;  Location: [facility] ENDOSCOPY;  Service: Cardiovascular;  Laterality: N/A;   CATARACT EXTRACTION W/ INTRAOCULAR LENS  IMPLANT, BILATERAL Bilateral 04/2013-05/2013   CORONARY ATHERECTOMY N/A 08/05/2021   Procedure: CORONARY ATHERECTOMY;  Surgeon: Arnoldo Lapping, MD;  Location: [facility] INVASIVE CV LAB;  Service: Cardiovascular;  Laterality: N/A;   CORONARY PRESSURE/FFR STUDY N/A 08/04/2021   Procedure: INTRAVASCULAR PRESSURE WIRE/FFR STUDY;  Surgeon: Sammy Crisp, MD;  Location: [facility] INVASIVE CV LAB;  Service: Cardiovascular;  Laterality: N/A;   CORONARY STENT INTERVENTION N/A 08/05/2021   Procedure: CORONARY STENT INTERVENTION;  Surgeon: Arnoldo Lapping, MD;  Location: [facility] INVASIVE CV LAB;  Service: Cardiovascular;  Laterality: N/A;   ENDARTERECTOMY Right 02/05/2017   Procedure: ENDARTERECTOMY CAROTID-RIGHT;  Surgeon: Richrd Char, MD;  Location: [facility] OR;  Service: Vascular;  Laterality: Right;   ESOPHAGOGASTRODUODENOSCOPY  11/08/2007    Normal esophagus without evidence of Barrett, mass, erosion/ Normal stomach, duodenal bulb   ESOPHAGOGASTRODUODENOSCOPY (EGD) WITH  PROPOFOL  N/A 11/08/2020   Procedure: ESOPHAGOGASTRODUODENOSCOPY (EGD) WITH PROPOFOL ;  Surgeon: Lindle Rhea, MD;  Location: [facility] ENDOSCOPY;  Service: Gastroenterology;  Laterality: N/A;   GASTRIC MOTILITY STUDY  11/13/2007   mildly delayed emptying subjectively, but normal  analysis-77% of tracer  emptied at 2 hours   JOINT REPLACEMENT     LAPAROSCOPIC APPENDECTOMY N/A 09/15/2012   Procedure: APPENDECTOMY LAPAROSCOPIC;  Surgeon: Beau Bound, MD;  Location: [facility] ORS;  Service: General;  Laterality: N/A;   LEFT HEART CATH AND CORONARY ANGIOGRAPHY N/A 08/04/2021   Procedure: LEFT HEART CATH AND CORONARY ANGIOGRAPHY;  Surgeon: Sammy Crisp, MD;  Location: [facility] INVASIVE CV LAB;  Service: Cardiovascular;  Laterality: N/A;   LEFT HEART CATHETERIZATION WITH CORONARY ANGIOGRAM N/A 01/05/2014   Procedure: LEFT HEART CATHETERIZATION WITH CORONARY ANGIOGRAM;  Surgeon: Avanell Leigh, MD;  Location: [facility] CATH LAB;  Service: Cardiovascular;  Laterality: N/A;   REPLACEMENT TOTAL KNEE Right 05/03/2006   TEE WITHOUT CARDIOVERSION N/A 03/17/2014   Procedure: TRANSESOPHAGEAL ECHOCARDIOGRAM (TEE);  Surgeon: Hazle Lites, MD;  Location: [facility] ENDOSCOPY;  Service: Cardiovascular;  Laterality: N/A;   TONSILLECTOMY  06/26/1970    Social History   Socioeconomic History   Marital status: Single    Spouse name: Not on file   Number of children: Not on file   Years of education: Not on file   Highest education level: Not on file  Occupational History   Occupation: retired  Tobacco Use   Smoking status: Never   Smokeless tobacco: Never  Vaping Use   Vaping status: Never Used  Substance and Sexual Activity   Alcohol use: Yes    Comment: rare   Drug use: No   Sexual activity: Not Currently    Birth control/protection: Post-menopausal  Other Topics Concern   Not on file  Social History Narrative   Not on file   Social Drivers of Health   Financial Resource Strain: Not on file  Food Insecurity: No Food Insecurity (10/12/2023)   Hunger Vital Sign    Worried About Running Out of Food in the Last Year: Never true    Ran Out of Food in the Last Year: Never true  Transportation Needs: Patient Declined (10/12/2023)   PRAPARE - Administrator, Civil Service (Medical): Patient declined     Lack of Transportation (Non-Medical): Patient declined  Physical Activity: Not on file  Stress: Not on file  Social Connections: Unknown (10/12/2023)   Social Connection and Isolation Panel [NHANES]    Frequency of Communication with Friends and Family: Three times a week    Frequency of Social Gatherings with Friends and Family: Once a week    Attends Religious Services: 1 to 4 times per year    Active Member of [employer] or Organizations: Patient declined    Attends Banker Meetings: Patient declined    Marital Status: Separated  Recent Concern: Social Connections - Socially Isolated (09/28/2023)   Social Connection and Isolation Panel [NHANES]    Frequency of Communication with Friends and Family: Three times a week    Frequency of Social Gatherings with Friends and Family: Once a week    Attends Religious Services: Never    Database administrator or Organizations: No    Attends Banker Meetings: Never    Marital Status: Separated  Intimate Partner Violence: Patient Declined (10/12/2023)   Humiliation, Afraid, Rape, and Kick questionnaire    Fear of Current or Ex-Partner: Patient declined  Emotionally Abused: Patient declined    Physically Abused: Patient declined    Sexually Abused: Patient declined   Family History  Problem Relation Age of Onset   Hyperlipidemia Mother    Hypertension Mother    Heart attack Mother    Cancer Father        lung   Hypertension Sister    Diabetes Brother    Heart disease Brother    Hyperlipidemia Brother    Hypertension Brother    Heart attack Brother    Other Brother        DVT      VITAL SIGNS BP (!) 136/50   Pulse 60   Temp 98.6 F (37 C)   Resp 20   Ht 5\' 6"  (1.676 m)   Wt 168 lb 6.4 oz (76.4 kg)   SpO2 97%   BMI 27.18 kg/m   Outpatient Encounter Medications as of 10/26/2023  Medication Sig   acetaminophen  (TYLENOL ) 650 MG CR tablet Take 650 mg by mouth every 6 (six) hours as needed for pain.    amLODipine  (NORVASC ) 10 MG tablet Take 1 tablet (10 mg total) by mouth daily.   atorvastatin  (LIPITOR ) 80 MG tablet Take 1 tablet (80 mg total) by mouth every evening.   carvedilol  (COREG ) 3.125 MG tablet Take 1 tablet (3.125 mg total) by mouth 2 (two) times daily with a meal.   ELIQUIS  5 MG TABS tablet TAKE (1) TABLET BY MOUTH TWICE DAILY.   empagliflozin  (JARDIANCE ) 10 MG TABS tablet Take 1 tablet (10 mg total) by mouth daily.   furosemide  (LASIX ) 40 MG tablet Take 1 tablet (40 mg total) by mouth daily.   insulin  aspart (NOVOLOG  FLEXPEN) 100 UNIT/ML FlexPen Max daily 30 units (Patient taking differently: 6 Units 3 (three) times daily with meals.)   Insulin  Glargine (BASAGLAR  KWIKPEN) 100 UNIT/ML Inject 22 Units into the skin daily.   isosorbide  mononitrate (IMDUR ) 60 MG 24 hr tablet Take 60 mg by mouth daily.   nitroGLYCERIN  (NITROSTAT ) 0.4 MG SL tablet PLACE 1 TABLET UNDER THE TONGUE EVERY 5 MINUTES AS NEEDED FOR CHEST PAIN (UP TO 3 DOSES)   polyethylene glycol (MIRALAX  / GLYCOLAX ) 17 g packet Take 17 g by mouth daily.   sacubitril -valsartan  (ENTRESTO ) 49-51 MG TAKE (1) TABLET BY MOUTH TWICE DAILY.   No facility-administered encounter medications on file as of 10/26/2023.     SIGNIFICANT DIAGNOSTIC EXAMS  TODAY  09-28-23: hgb A1c 7.8 10-12-23: wbc 5.0; hgb 10.9; hct 31.9 mcv 81.6 plt 152; glucose 136; bun 29; creat 1.70; k+ 3.2; na++ 136; ca 8.8; gfr 30; protein 5.9 albumin 3.0; mag 2.2 10-13-23: wbc 4.7; hgb 11.0; hct 32.2; mcv 81.9 plt 149; glucose 104; bun 32; creat 2.06; k+ 4.6; na++ 139; ca 9.2 gfr 24; BNP 268  Review of Systems  Constitutional:  Negative for malaise/fatigue.  Respiratory:  Negative for cough and shortness of breath.   Cardiovascular:  Negative for chest pain, palpitations and leg swelling.  Gastrointestinal:  Negative for abdominal pain, constipation and heartburn.  Musculoskeletal:  Negative for back pain, joint pain and myalgias.  Skin: Negative.   Neurological:   Negative for dizziness.  Psychiatric/Behavioral:  The patient is not nervous/anxious.    Physical Exam Constitutional:      General: She is not in acute distress.    Appearance: She is well-developed. She is not diaphoretic.  Neck:     Thyroid : No thyromegaly.  Cardiovascular:     Rate and Rhythm: Normal rate and  regular rhythm.     Heart sounds: Normal heart sounds.  Pulmonary:     Effort: Pulmonary effort is normal. No respiratory distress.     Breath sounds: Normal breath sounds.  Abdominal:     General: Bowel sounds are normal. There is no distension.     Palpations: Abdomen is soft.     Tenderness: There is no abdominal tenderness.  Musculoskeletal:     Cervical back: Neck supple.     Right lower leg: No edema.     Left lower leg: No edema.     Comments: Left leg in splint    Lymphadenopathy:     Cervical: No cervical adenopathy.  Skin:    General: Skin is warm and dry.  Neurological:     Mental Status: She is alert and oriented to person, place, and time.  Psychiatric:        Mood and Affect: Mood normal.    ASSESSMENT/ PLAN:  TODAY  Aortic atherosclerosis Chronic systolic CHF (congestive heart failure) Right hemiparesis   Will continue current medications Will continue therapy as directed Will continue to monitor her status   Time spent with patient: 40 minutes: therapy; medications dietary    Britt Candle NP [facility] Adult Medicine  Contact [phone] Monday through Friday 8am- 5pm  After hours call [phone]

## 2023-11-05 ENCOUNTER — Encounter: Payer: Self-pay | Admitting: Adult Health

## 2023-11-05 ENCOUNTER — Non-Acute Institutional Stay (SKILLED_NURSING_FACILITY): Payer: Self-pay | Admitting: Adult Health

## 2023-11-05 ENCOUNTER — Other Ambulatory Visit: Payer: Self-pay | Admitting: Adult Health

## 2023-11-05 DIAGNOSIS — S82832S Other fracture of upper and lower end of left fibula, sequela: Secondary | ICD-10-CM | POA: Diagnosis not present

## 2023-11-05 DIAGNOSIS — I482 Chronic atrial fibrillation, unspecified: Secondary | ICD-10-CM

## 2023-11-05 DIAGNOSIS — S82192S Other fracture of upper end of left tibia, sequela: Secondary | ICD-10-CM

## 2023-11-05 DIAGNOSIS — E1122 Type 2 diabetes mellitus with diabetic chronic kidney disease: Secondary | ICD-10-CM

## 2023-11-05 DIAGNOSIS — Z794 Long term (current) use of insulin: Secondary | ICD-10-CM | POA: Diagnosis not present

## 2023-11-05 DIAGNOSIS — N1832 Chronic kidney disease, stage 3b: Secondary | ICD-10-CM

## 2023-11-05 DIAGNOSIS — G8191 Hemiplegia, unspecified affecting right dominant side: Secondary | ICD-10-CM | POA: Diagnosis not present

## 2023-11-05 MED ORDER — AMLODIPINE BESYLATE 10 MG PO TABS: 10.0000 mg | ORAL_TABLET | Freq: Every day | ORAL | 0 refills | Status: AC

## 2023-11-05 MED ORDER — ISOSORBIDE MONONITRATE ER 60 MG PO TB24: 60.0000 mg | ORAL_TABLET | Freq: Every day | ORAL | 0 refills | Status: AC

## 2023-11-05 MED ORDER — ATORVASTATIN CALCIUM 80 MG PO TABS: 80.0000 mg | ORAL_TABLET | Freq: Every evening | ORAL | 0 refills | Status: AC

## 2023-11-05 MED ORDER — ENTRESTO 49-51 MG PO TABS: 1.0000 | ORAL_TABLET | Freq: Two times a day (BID) | ORAL | 0 refills | Status: DC

## 2023-11-05 MED ORDER — FUROSEMIDE 40 MG PO TABS: 40.0000 mg | ORAL_TABLET | Freq: Every day | ORAL | 0 refills | Status: AC

## 2023-11-05 MED ORDER — ELIQUIS 5 MG PO TABS: 5.0000 mg | ORAL_TABLET | Freq: Two times a day (BID) | ORAL | 0 refills | Status: AC

## 2023-11-05 MED ORDER — EMPAGLIFLOZIN 10 MG PO TABS: 10.0000 mg | ORAL_TABLET | Freq: Every day | ORAL | 0 refills | Status: DC

## 2023-11-05 MED ORDER — CARVEDILOL 3.125 MG PO TABS: 3.1250 mg | ORAL_TABLET | Freq: Two times a day (BID) | ORAL | 0 refills | Status: DC

## 2023-11-05 MED ORDER — NITROGLYCERIN 0.4 MG SL SUBL: 0.4000 mg | SUBLINGUAL_TABLET | SUBLINGUAL | 5 refills | Status: AC | PRN

## 2023-11-05 MED ORDER — BASAGLAR KWIKPEN 100 UNIT/ML ~~LOC~~ SOPN: 22.0000 [IU] | PEN_INJECTOR | Freq: Every day | SUBCUTANEOUS | 0 refills | Status: DC

## 2023-11-05 MED ORDER — NOVOLOG FLEXPEN 100 UNIT/ML ~~LOC~~ SOPN: 6.0000 [IU] | PEN_INJECTOR | Freq: Three times a day (TID) | SUBCUTANEOUS | 0 refills | Status: DC

## 2023-11-05 NOTE — Progress Notes (Signed)
 Location:  [facility] Room Number: 119 Place of Service:  SNF (31)   CODE STATUS: full   Allergies  Allergen Reactions   Penicillins Hives    Chief Complaint  Patient presents with   Discharge Note    HPI:  She is being discharged to home with home health for pt/ot. She will need a wheelchair; will need her prescriptions written and will need to follow up with her medical provider. She had been hospitalized after a fall while trying to get into her car. She suffered a left tibia/fibula fracture. She was admitted to this facility for short term rehab. Therapy: ambulate 20 feet with rolling walker and min assist; upper body supervision ; lower body min to mod assist; stand/pivot min assist; brp: min assist; BCAT 29/50   Past Medical History:  Diagnosis Date   (HFpEF) heart failure with preserved ejection fraction (HCC)    Limited Echo 7/22: Mild asymmetrical LVH due to scar and thinning of basal posterior wall and background conc LVH, apical and mid segments hyperdynamic, EF 60-65, basal inf-lat and basal inf AK, normal RVSF, RVSP 26.6, mild LAE, mild-moderate MR, mild AV sclerosis w/o AS // Echo 5/22: EF 55-60, RVSP 27, mild to mod LAE, poss small to mod eff; AV sclerosis no AS   Arthritis    "all over"   CAD (coronary artery disease)    a. NSTEMI 12/2013 - occluded dominant RCA with L-R collaterals, moderate prox segmental LAD disease, for medical therapy initially, EF 60% with subtle inferobasal hypokinesia. Consider PCI for refractory CP.   CKD (chronic kidney disease), stage III (HCC)    Hypertension    Migraines    "weekly" (01/06/2014)   Sickle cell trait (HCC)    TIA (transient ischemic attack) 1978   Type II diabetes mellitus (HCC)    Vertigo     Past Surgical History:  Procedure Laterality Date   APPENDECTOMY  08/24/2012   had appendix frozen   CARDIAC CATHETERIZATION  "years ago" & 01/05/2014   CARDIOVERSION N/A 03/17/2014   Procedure:  CARDIOVERSION;  Surgeon: Hazle Lites, MD;  Location: [facility] ENDOSCOPY;  Service: Cardiovascular;  Laterality: N/A;   CARDIOVERSION N/A 01/14/2021   Procedure: CARDIOVERSION;  Surgeon: Hazle Lites, MD;  Location: [facility] ENDOSCOPY;  Service: Cardiovascular;  Laterality: N/A;   CARDIOVERSION N/A 12/13/2021   Procedure: CARDIOVERSION;  Surgeon: Lake Pilgrim, MD;  Location: [facility] ENDOSCOPY;  Service: Cardiovascular;  Laterality: N/A;   CATARACT EXTRACTION W/ INTRAOCULAR LENS  IMPLANT, BILATERAL Bilateral 04/2013-05/2013   CORONARY ATHERECTOMY N/A 08/05/2021   Procedure: CORONARY ATHERECTOMY;  Surgeon: Arnoldo Lapping, MD;  Location: [facility] INVASIVE CV LAB;  Service: Cardiovascular;  Laterality: N/A;   CORONARY PRESSURE/FFR STUDY N/A 08/04/2021   Procedure: INTRAVASCULAR PRESSURE WIRE/FFR STUDY;  Surgeon: Sammy Crisp, MD;  Location: [facility] INVASIVE CV LAB;  Service: Cardiovascular;  Laterality: N/A;   CORONARY STENT INTERVENTION N/A 08/05/2021   Procedure: CORONARY STENT INTERVENTION;  Surgeon: Arnoldo Lapping, MD;  Location: [facility] INVASIVE CV LAB;  Service: Cardiovascular;  Laterality: N/A;   ENDARTERECTOMY Right 02/05/2017   Procedure: ENDARTERECTOMY CAROTID-RIGHT;  Surgeon: Richrd Char, MD;  Location: [facility] OR;  Service: Vascular;  Laterality: Right;   ESOPHAGOGASTRODUODENOSCOPY  11/08/2007    Normal esophagus without evidence of Barrett, mass, erosion/ Normal stomach, duodenal bulb   ESOPHAGOGASTRODUODENOSCOPY (EGD) WITH PROPOFOL  N/A 11/08/2020   Procedure: ESOPHAGOGASTRODUODENOSCOPY (EGD) WITH PROPOFOL ;  Surgeon: Lindle Rhea, MD;  Location: [facility] ENDOSCOPY;  Service: Gastroenterology;  Laterality:  N/A;   GASTRIC MOTILITY STUDY  11/13/2007   mildly delayed emptying subjectively, but normal  analysis-77% of tracer emptied at 2 hours   JOINT REPLACEMENT     LAPAROSCOPIC APPENDECTOMY N/A 09/15/2012   Procedure: APPENDECTOMY LAPAROSCOPIC;  Surgeon: Beau Bound, MD;  Location: [facility] ORS;  Service:  General;  Laterality: N/A;   LEFT HEART CATH AND CORONARY ANGIOGRAPHY N/A 08/04/2021   Procedure: LEFT HEART CATH AND CORONARY ANGIOGRAPHY;  Surgeon: Sammy Crisp, MD;  Location: [facility] INVASIVE CV LAB;  Service: Cardiovascular;  Laterality: N/A;   LEFT HEART CATHETERIZATION WITH CORONARY ANGIOGRAM N/A 01/05/2014   Procedure: LEFT HEART CATHETERIZATION WITH CORONARY ANGIOGRAM;  Surgeon: Avanell Leigh, MD;  Location: [facility] CATH LAB;  Service: Cardiovascular;  Laterality: N/A;   REPLACEMENT TOTAL KNEE Right 05/03/2006   TEE WITHOUT CARDIOVERSION N/A 03/17/2014   Procedure: TRANSESOPHAGEAL ECHOCARDIOGRAM (TEE);  Surgeon: Hazle Lites, MD;  Location: [facility] ENDOSCOPY;  Service: Cardiovascular;  Laterality: N/A;   TONSILLECTOMY  06/26/1970    Social History   Socioeconomic History   Marital status: Single    Spouse name: Not on file   Number of children: Not on file   Years of education: Not on file   Highest education level: Not on file  Occupational History   Occupation: retired  Tobacco Use   Smoking status: Never   Smokeless tobacco: Never  Vaping Use   Vaping status: Never Used  Substance and Sexual Activity   Alcohol use: Yes    Comment: rare   Drug use: No   Sexual activity: Not Currently    Birth control/protection: Post-menopausal  Other Topics Concern   Not on file  Social History Narrative   Not on file   Social Drivers of Health   Financial Resource Strain: Not on file  Food Insecurity: No Food Insecurity (10/12/2023)   Hunger Vital Sign    Worried About Running Out of Food in the Last Year: Never true    Ran Out of Food in the Last Year: Never true  Transportation Needs: Patient Declined (10/12/2023)   PRAPARE - Administrator, Civil Service (Medical): Patient declined    Lack of Transportation (Non-Medical): Patient declined  Physical Activity: Not on file  Stress: Not on file  Social Connections: Unknown (10/12/2023)   Social Connection and  Isolation Panel [NHANES]    Frequency of Communication with Friends and Family: Three times a week    Frequency of Social Gatherings with Friends and Family: Once a week    Attends Religious Services: 1 to 4 times per year    Active Member of [employer] or Organizations: Patient declined    Attends Banker Meetings: Patient declined    Marital Status: Separated  Recent Concern: Social Connections - Socially Isolated (09/28/2023)   Social Connection and Isolation Panel [NHANES]    Frequency of Communication with Friends and Family: Three times a week    Frequency of Social Gatherings with Friends and Family: Once a week    Attends Religious Services: Never    Database administrator or Organizations: No    Attends Banker Meetings: Never    Marital Status: Separated  Intimate Partner Violence: Patient Declined (10/12/2023)   Humiliation, Afraid, Rape, and Kick questionnaire    Fear of Current or Ex-Partner: Patient declined    Emotionally Abused: Patient declined    Physically Abused: Patient declined    Sexually Abused: Patient declined   Family History  Problem  Relation Age of Onset   Hyperlipidemia Mother    Hypertension Mother    Heart attack Mother    Cancer Father        lung   Hypertension Sister    Diabetes Brother    Heart disease Brother    Hyperlipidemia Brother    Hypertension Brother    Heart attack Brother    Other Brother        DVT      VITAL SIGNS BP 136/74   Pulse (!) 55   Temp 97.8 F (36.6 C)   Resp 20   Ht 5\' 6"  (1.676 m)   Wt 169 lb 12.8 oz (77 kg)   SpO2 99%   BMI 27.41 kg/m   Outpatient Encounter Medications as of 11/05/2023  Medication Sig   acetaminophen  (TYLENOL ) 650 MG CR tablet Take 650 mg by mouth every 6 (six) hours as needed for pain.   amLODipine  (NORVASC ) 10 MG tablet Take 1 tablet (10 mg total) by mouth daily.   atorvastatin  (LIPITOR ) 80 MG tablet Take 1 tablet (80 mg total) by mouth every evening.    carvedilol  (COREG ) 3.125 MG tablet Take 1 tablet (3.125 mg total) by mouth 2 (two) times daily with a meal.   ELIQUIS  5 MG TABS tablet Take 1 tablet (5 mg total) by mouth 2 (two) times daily.   empagliflozin  (JARDIANCE ) 10 MG TABS tablet Take 1 tablet (10 mg total) by mouth daily.   furosemide  (LASIX ) 40 MG tablet Take 1 tablet (40 mg total) by mouth daily.   insulin  aspart (NOVOLOG  FLEXPEN) 100 UNIT/ML FlexPen Inject 6 Units into the skin 3 (three) times daily with meals.   Insulin  Glargine (BASAGLAR  KWIKPEN) 100 UNIT/ML Inject 22 Units into the skin daily.   isosorbide  mononitrate (IMDUR ) 60 MG 24 hr tablet Take 1 tablet (60 mg total) by mouth daily.   nitroGLYCERIN  (NITROSTAT ) 0.4 MG SL tablet Place 1 tablet (0.4 mg total) under the tongue every 5 (five) minutes as needed for chest pain.   polyethylene glycol (MIRALAX  / GLYCOLAX ) 17 g packet Take 17 g by mouth daily.   sacubitril -valsartan  (ENTRESTO ) 49-51 MG Take 1 tablet by mouth 2 (two) times daily.   No facility-administered encounter medications on file as of 11/05/2023.     SIGNIFICANT DIAGNOSTIC EXAMS       ASSESSMENT/ PLAN:     Britt Candle NP [facility] Adult Medicine  Contact [phone] Monday through Friday 8am- 5pm  After hours call [phone]

## 2023-11-06 ENCOUNTER — Telehealth: Payer: Self-pay | Admitting: Orthopedic Surgery

## 2023-11-06 NOTE — Telephone Encounter (Signed)
 Dr. Delfino Fellers pt - someone lvm for the pt today at 12:51pm.  She stated that that the pt's PT told her she needed to call and request pain medication.  She doesn't have an appointment.  Dr. Phyllis Breeze consulted the pt on 10/12/23.  I have lvm for her to give me a call tomorrow to schedule, per the consult notes she ws supposed to been 2 weeks later.  [phone]

## 2023-11-07 NOTE — Telephone Encounter (Signed)
 Does she need pain meds or does PT need pain meds? Im confused, I will call patient.

## 2023-11-07 NOTE — Telephone Encounter (Signed)
I left message for patient to call me back.

## 2023-11-08 ENCOUNTER — Telehealth: Payer: Self-pay | Admitting: Orthopedic Surgery

## 2023-11-08 ENCOUNTER — Ambulatory Visit: Admitting: Orthopedic Surgery

## 2023-11-08 NOTE — Telephone Encounter (Signed)
 Called [facility] and lvm for Lasharia advising what Dr. Marvina Slough said.

## 2023-11-08 NOTE — Telephone Encounter (Signed)
She is coming in today

## 2023-11-08 NOTE — Telephone Encounter (Signed)
 Dr. Delfino Fellers pt - Cassandra Adams(sp?) OT w/[facility] [phone] lvm requesting OT plan of care approval [id number] also reporting that the pt has not been taking her brace off to wash her leg.  She has bruising on several areas of the leg.  I had added the pt on the schedule for today when her daughter called to schedule and she called back and said she can't come today and that she can not get the pt here until 11/22/23, which is when she is now scheduled.

## 2023-11-14 ENCOUNTER — Ambulatory Visit: Payer: Medicare HMO | Admitting: Internal Medicine

## 2023-11-15 NOTE — Telephone Encounter (Signed)
 Cassandra Adams Well (?) w/[facility] [phone] lvm regarding medications.  I called her back and advised the pt is not established here yet.

## 2023-11-22 ENCOUNTER — Ambulatory Visit (HOSPITAL_COMMUNITY)
Admission: RE | Admit: 2023-11-22 | Discharge: 2023-11-22 | Disposition: A | Discharge status: Home / Self Care | Source: Ambulatory Visit | Attending: Orthopedic Surgery | Admitting: Orthopedic Surgery

## 2023-11-22 ENCOUNTER — Ambulatory Visit: Admitting: Orthopedic Surgery

## 2023-11-22 ENCOUNTER — Encounter: Payer: Self-pay | Admitting: Orthopedic Surgery

## 2023-11-22 ENCOUNTER — Ambulatory Visit (INDEPENDENT_AMBULATORY_CARE_PROVIDER_SITE_OTHER): Payer: Self-pay

## 2023-11-22 ENCOUNTER — Telehealth: Payer: Self-pay | Admitting: Orthopedic Surgery

## 2023-11-22 VITALS — BP 142/68 | HR 58 | Ht 66.0 in | Wt 169.0 lb

## 2023-11-22 DIAGNOSIS — M7989 Other specified soft tissue disorders: Secondary | ICD-10-CM | POA: Diagnosis not present

## 2023-11-22 DIAGNOSIS — M79662 Pain in left lower leg: Secondary | ICD-10-CM

## 2023-11-22 DIAGNOSIS — R600 Localized edema: Secondary | ICD-10-CM | POA: Diagnosis not present

## 2023-11-22 DIAGNOSIS — S82132A Displaced fracture of medial condyle of left tibia, initial encounter for closed fracture: Secondary | ICD-10-CM | POA: Diagnosis not present

## 2023-11-22 DIAGNOSIS — S82102A Unspecified fracture of upper end of left tibia, initial encounter for closed fracture: Secondary | ICD-10-CM | POA: Insufficient documentation

## 2023-11-22 MED ORDER — HYDROCODONE-ACETAMINOPHEN 5-325 MG PO TABS: 1.0000 | ORAL_TABLET | Freq: Four times a day (QID) | ORAL | 0 refills | Status: AC | PRN

## 2023-11-22 NOTE — Progress Notes (Signed)
  Intake history:  BP (!) 142/68   Pulse (!) 58   Ht 5\' 6"  (1.676 m)   Wt 169 lb (76.7 kg)   BMI 27.28 kg/m  Body mass index is 27.28 kg/m.    WHAT ARE WE SEEING YOU FOR TODAY?   left knee(s)  How long has this bothered you? (DOI?DOS?WS?)  on 10/11/23  Anticoag.  Yes  Diabetes Yes  Heart disease Yes  Hypertension Yes  SMOKING HX No  Kidney disease Yes     Latest Ref Rng & Units 10/13/2023    5:15 AM 10/12/2023    5:20 AM 10/11/2023    6:36 PM  CMP  Glucose 70 - 99 mg/dL 865  784  75   BUN 8 - 23 mg/dL 32  29  31   Creatinine 0.44 - 1.00 mg/dL 6.96  2.95  2.84   Sodium 135 - 145 mmol/L 139  136  138   Potassium 3.5 - 5.1 mmol/L 4.6  3.2  3.6   Chloride 98 - 111 mmol/L 104  102  102   CO2 22 - 32 mmol/L 30  25  27    Calcium  8.9 - 10.3 mg/dL 9.2  8.8  9.5   Total Protein 6.5 - 8.1 g/dL  5.9    Total Bilirubin 0.0 - 1.2 mg/dL  0.7    Alkaline Phos 38 - 126 U/L  58    AST 15 - 41 U/L  29    ALT 0 - 44 U/L  21       Any ALLERGIES ________________ Allergies  Allergen Reactions   Penicillins Hives   ______________________________   Treatment:  Have you taken:  Tylenol  No  Advil No  Had PT No  Had injection No  Other  _________________________

## 2023-11-22 NOTE — Patient Instructions (Addendum)
 Go to [facility] for Ultrasound today/ now They will work you in for the study  Therapy instructions  WBAT WITH BRACE 0-90 AND WALKER (OK TO LOCK BRACE FOR AMBULATION) AAROM QUAD STRENGTHENING

## 2023-11-22 NOTE — Telephone Encounter (Signed)
 I called patient to let her know US  did not show DVT  To you FYI

## 2023-11-22 NOTE — Progress Notes (Addendum)
  Intake history:   In-hospital consult follow up proximal tibial shaft fracture left knee  Current treatment knee immobilizer nonweightbearing to toe-touch weightbearing  BP (!) 142/68   Pulse (!) 58   Ht 5\' 6"  (1.676 m)   Wt 169 lb (76.7 kg)   BMI 27.28 kg/m  Body mass index is 27.28 kg/m.  Patient presents with her daughter complaining of severe pain left leg with bilateral pitting edema  Soft compartments  Tenderness at the fracture site over the proximal tibial metaphysis  Both legs indeed have severe edema both pitting  Imaging was performed  Calf is tender but soft compartments are soft  The patient is not on any pain medication she was taken methocarbamol  for pain  DG Knee 3 Views Left Result Date: 11/22/2023 Radiology report left knee follow-up on a left proximal tibia fracture Injury date is October 11, 2023 X-ray shows a proximal metaphyseal diaphyseal fracture of the left tibia with early callus formation indicating progression towards healing.  There is some osteopenia.  There is some obliquity to the joint line placing the knee in valgus. Impression healing fracture left proximal tibia     Playmaker hinged knee brace 0-90 to  support unstable left lower extremity and fracture  The brace was needed immediately to support the unstable limb and help the patient progress to weightbearing status  Patient can now weight-bear as tolerated with brace and walker Start active assisted range of motion exercises and quad strengthening  DVT study was ordered  Pain med patient was ordered  And follow-up will be done in 4 weeks for x-rays  Meds ordered this encounter  Medications   HYDROcodone -acetaminophen  (NORCO/VICODIN) 5-325 MG tablet    Sig: Take 1 tablet by mouth every 6 (six) hours as needed for moderate pain (pain score 4-6).    Dispense:  30 tablet    Refill:  0

## 2023-11-28 ENCOUNTER — Telehealth: Payer: Self-pay | Admitting: Orthopedic Surgery

## 2023-11-28 NOTE — Telephone Encounter (Signed)
 Dr. Delfino Fellers [name] w/[facility] [phone] lvm stating whenever they get an abnormal vital signs they have to let the provider know, heart rate is 50 beats per minutes, checked it manually and w/the machine, not complaining of any chest pain, other vitals are w/in normal limits.  Knee is painful.  Pt's # [phone].  [name] would like a call back.

## 2023-12-03 NOTE — Telephone Encounter (Signed)
 I called [name] back and advised her she voiced understanding I told her I will reach out to patient also.

## 2023-12-06 DIAGNOSIS — S8262XD Displaced fracture of lateral malleolus of left fibula, subsequent encounter for closed fracture with routine healing: Secondary | ICD-10-CM | POA: Diagnosis not present

## 2023-12-06 DIAGNOSIS — M6281 Muscle weakness (generalized): Secondary | ICD-10-CM | POA: Diagnosis not present

## 2023-12-06 DIAGNOSIS — S8262XS Displaced fracture of lateral malleolus of left fibula, sequela: Secondary | ICD-10-CM | POA: Diagnosis not present

## 2023-12-11 DIAGNOSIS — M25552 Pain in left hip: Secondary | ICD-10-CM | POA: Insufficient documentation

## 2023-12-20 ENCOUNTER — Ambulatory Visit (INDEPENDENT_AMBULATORY_CARE_PROVIDER_SITE_OTHER)

## 2023-12-20 ENCOUNTER — Ambulatory Visit: Admitting: Orthopedic Surgery

## 2023-12-20 ENCOUNTER — Ambulatory Visit: Admitting: Podiatry

## 2023-12-20 ENCOUNTER — Ambulatory Visit (INDEPENDENT_AMBULATORY_CARE_PROVIDER_SITE_OTHER): Payer: Self-pay

## 2023-12-20 DIAGNOSIS — M7731 Calcaneal spur, right foot: Secondary | ICD-10-CM

## 2023-12-20 DIAGNOSIS — S82192D Other fracture of upper end of left tibia, subsequent encounter for closed fracture with routine healing: Secondary | ICD-10-CM

## 2023-12-20 DIAGNOSIS — M722 Plantar fascial fibromatosis: Secondary | ICD-10-CM | POA: Diagnosis not present

## 2023-12-20 MED ORDER — TRIAMCINOLONE ACETONIDE 10 MG/ML IJ SUSP
10.0000 mg | Freq: Once | INTRAMUSCULAR | Status: AC
Start: 2023-12-20 — End: 2023-12-20
  Administered 2023-12-20: 10 mg

## 2023-12-20 NOTE — Progress Notes (Signed)
   There were no vitals taken for this visit.  There is no height or weight on file to calculate BMI.  Chief Complaint  Patient presents with   Knee Pain    Left     Encounter Diagnosis  Name Primary?   Other closed fracture of proximal end of left tibia with routine healing, subsequent encounter 10/11/23 Yes    DOI/DOS/ Date: 10/11/23  Improved

## 2023-12-20 NOTE — Progress Notes (Signed)
 Patient presents with a complaint of pain in the plantar and posterior aspect of the heel right.  More pain on the plantar aspect than the posterior.  Began about 2 or 3 weeks ago.  She is currently using a brace for a fracture in the tibia on the left.   Physical exam:  General appearance: Pleasant, and in no acute distress. AOx3.  Vascular: Pedal pulses: DP 2/4 bilaterally, PT 1/4 bilaterally.  Moderate edema lower legs bilaterally. Capillary fill time immediate bilaterally.  Neurological: Light touch intact feet bilaterally.  Normal Achilles reflex bilaterally.  No clonus or spasticity noted.  Negative Tinel's sign tarsal tunnel and porta pedis right  Dermatologic:   Skin normal temperature bilaterally.  Skin normal color, tone, and texture bilaterally.   Musculoskeletal: Tenderness in the plantar aspect of the heel at the plantar calcaneal medial tubercle right.  No tenderness with lateral compression of the calcaneus.  Some tenderness in the posterior aspect of the heel at the insertion of the Achilles tendon.  Radiographs: 3 views foot right: Osteophytic changes of the plantar aspect calcaneus.  No evidence of any stress fractures.  Slightly decreased bone density.  Nones any bone tumors.  Diagnosis: 1.  Plantar fasciitis right 2.  Calcaneal spur right.  Plan: -Discussed with her plantar fasciitis and etiology and treatment.  Will try an injection today.  Discussed with the proper shoes. -Gave written and oral wound care instructions for exercises and at home PT she can do -injected 3cc 2:1 mixture 0.5 cc Marcaine :Kenolog 10mg /1ml at medial origin of the plantar fascia right.    Return 2 weeks follow-up injection plantar fascia right

## 2023-12-20 NOTE — Patient Instructions (Signed)
 Fracture healing 90%  She needs posterior capsular stretching and hamstring stretching  WBAT IN BRACE

## 2023-12-20 NOTE — Progress Notes (Signed)
 FRACTURE CARE FOLLOW UP   Patient: Cassandra Adams           Date of Birth: [birth year]           MRN: [id number] Visit Date: 12/20/2023 Requested by: Marvine Rush, MD [address] [place],  [place] [address] PCP: Marvine Rush, MD  Encounter Diagnosis  Name Primary?   Other closed fracture of proximal end of left tibia with routine healing, subsequent encounter 10/11/23 Yes   Plan Continue bracing, weight-bear as tolerated, hamstring stretching and range of motion exercises, return in 6 weeks   GLOBAL PERIOD: 01/10/24  Chief Complaint  Patient presents with   Knee Pain    Left    [name] has improved but she is having pain and swelling.  Pain in the back of the knee tightness  Fracture site nontender  X-ray noted below  Continue bracing, weight-bear as tolerated, hamstring stretching and range of motion exercises, return in 6 weeks  DG Knee 1-2 Views Left Result Date: 12/20/2023 Left knee 2 views follow-up proximal metaphyseal fracture left tibia Osteoarthritis noted in the lateral compartment apex anterior angulation on the lateral x-ray medial slope of the proximal fragment April 17 initial images We could see increased sclerosis around the bone and no further displacement Interval progression towards healing of the proximal small metaphyseal fracture left tibia note there is also a fibular fracture Clinical correlation suggested to determine treatment course

## 2023-12-20 NOTE — Progress Notes (Unsigned)
 SABRA

## 2023-12-21 ENCOUNTER — Other Ambulatory Visit (HOSPITAL_COMMUNITY): Payer: Self-pay

## 2023-12-21 ENCOUNTER — Ambulatory Visit (HOSPITAL_COMMUNITY)
Admission: RE | Admit: 2023-12-21 | Discharge: 2023-12-21 | Disposition: A | Discharge status: Home / Self Care | Source: Ambulatory Visit | Attending: Internal Medicine | Admitting: Internal Medicine

## 2023-12-21 ENCOUNTER — Other Ambulatory Visit: Payer: Self-pay | Admitting: Pharmacist

## 2023-12-21 DIAGNOSIS — I6523 Occlusion and stenosis of bilateral carotid arteries: Secondary | ICD-10-CM | POA: Diagnosis not present

## 2023-12-21 MED ORDER — CARVEDILOL 3.125 MG PO TABS
3.1250 mg | ORAL_TABLET | Freq: Two times a day (BID) | ORAL | 0 refills | Status: DC
  Filled 2023-12-21: qty 180, 90d supply, fill #0

## 2023-12-24 ENCOUNTER — Ambulatory Visit (HOSPITAL_BASED_OUTPATIENT_CLINIC_OR_DEPARTMENT_OTHER): Payer: Self-pay | Admitting: Internal Medicine

## 2023-12-24 DIAGNOSIS — I6523 Occlusion and stenosis of bilateral carotid arteries: Secondary | ICD-10-CM

## 2023-12-27 ENCOUNTER — Telehealth: Payer: Self-pay

## 2023-12-27 DIAGNOSIS — S82192D Other fracture of upper end of left tibia, subsequent encounter for closed fracture with routine healing: Secondary | ICD-10-CM

## 2023-12-27 NOTE — Telephone Encounter (Signed)
 Put in orders ok per Dr Margrette

## 2023-12-27 NOTE — Telephone Encounter (Signed)
 Roja with [facility] left message stating that patient is doing good and needs to be transitioned to Outpatient physical therapy. Stated that they need an order sent for this. Any questions please call Roja at [phone]

## 2024-01-02 ENCOUNTER — Other Ambulatory Visit: Payer: Self-pay | Admitting: Internal Medicine

## 2024-01-04 ENCOUNTER — Ambulatory Visit: Admitting: Podiatry

## 2024-01-04 ENCOUNTER — Encounter: Payer: Self-pay | Admitting: Podiatry

## 2024-01-04 DIAGNOSIS — L97402 Non-pressure chronic ulcer of unspecified heel and midfoot with fat layer exposed: Secondary | ICD-10-CM

## 2024-01-04 MED ORDER — MUPIROCIN 2 % EX OINT: 1.0000 | TOPICAL_OINTMENT | Freq: Two times a day (BID) | CUTANEOUS | 0 refills | Status: AC

## 2024-01-04 NOTE — Addendum Note (Signed)
 Addended by: GIB ANTES R on: 01/04/2024 01:31 PM   Modules accepted: Orders

## 2024-01-04 NOTE — Progress Notes (Signed)
 Follow-up injection heel right.  Injected plantar fascia last visit says this helped the pain for about a week.  Several days ago she noticed a breakdown of the skin on the plantar medial aspect of the heel.  Been very tender.  No F/C or N/V.  Some clear drainage present she has been putting Bactroban  on it  Physical Exam:  Patient alert and oriented x 3.  No complaints of nausea, vomiting, fever, or chills  Vascular: DP pulses 2/4 bilateral. PT pulses 1/4 lateral.  Mild edema at heel right. Capillary fill time immediate right.  Dermatologic: Full-thickness ulceration penetrating into the subcutaneous tissue plantar medial aspect heel right.  Fibrous tissue with a small amount of granulation tissue present.  No signs of infection minimal undermining.  Moderate clear fluid. Measures 10 mm wide x 4 mm long x 3 deep.   Neurologic: Grossly intact  Musculoskeletal: Tenderness heel right    Diagnoses: 1.  Full-thickness ulceration penetrating subcutaneous tissue plantar medial aspect heel right.  Plan: -Discussed ulceration on heel right.  Discussed etiology and treatment.  Try to stay off it is much as possible and keep limb elevated. - Dispensed surgical shoe right - Rx Bactroban  ointment, apply twice daily to wounds feet, refill x 1   - Sharply debrided full-thickness ulceration plantar medial aspect heel right debride any devitalized tissue down to the base of the ulcer in the subcutaneous tissue.  Applied antibiotic ointment and a light dressing.    Return 2 weeks f/u ulcer right

## 2024-01-05 DIAGNOSIS — S8262XD Displaced fracture of lateral malleolus of left fibula, subsequent encounter for closed fracture with routine healing: Secondary | ICD-10-CM | POA: Diagnosis not present

## 2024-01-05 DIAGNOSIS — M6281 Muscle weakness (generalized): Secondary | ICD-10-CM | POA: Diagnosis not present

## 2024-01-05 DIAGNOSIS — S8262XS Displaced fracture of lateral malleolus of left fibula, sequela: Secondary | ICD-10-CM | POA: Diagnosis not present

## 2024-01-11 ENCOUNTER — Ambulatory Visit: Admitting: Podiatry

## 2024-01-11 DIAGNOSIS — M79672 Pain in left foot: Secondary | ICD-10-CM

## 2024-01-11 DIAGNOSIS — I70209 Unspecified atherosclerosis of native arteries of extremities, unspecified extremity: Secondary | ICD-10-CM

## 2024-01-11 DIAGNOSIS — E1151 Type 2 diabetes mellitus with diabetic peripheral angiopathy without gangrene: Secondary | ICD-10-CM | POA: Diagnosis not present

## 2024-01-11 DIAGNOSIS — M79671 Pain in right foot: Secondary | ICD-10-CM | POA: Diagnosis not present

## 2024-01-11 DIAGNOSIS — B351 Tinea unguium: Secondary | ICD-10-CM

## 2024-01-11 NOTE — Progress Notes (Signed)
 Patient presents for evaluation and treatment of tenderness and some redness around nails feet.  Tenderness around toes with walking and wearing shoes.  Physical exam:  General appearance: Alert, pleasant, and in no acute distress.  Vascular: Pedal pulses: DP 2/4 B/L, PT 0/4 B/L.  Mild edema lower legs bilaterally  Neurological:    Dermatologic:  Nails thickened, disfigured, discolored 1-5 BL with subungual debris.  Redness and hypertrophic nail folds along nail folds bilaterally but no signs of drainage or infection.  Musculoskeletal:     Diagnosis: 1. Painful onychomycotic nails 1 through 5 bilaterally. 2. Pain toes 1 through 5 bilaterally. 3.  Diabetes mellitus type 2 with PVD  Plan: Debrided onychomycotic nails 1 through 5 bilaterally.  Return 3 months [facility]

## 2024-01-17 ENCOUNTER — Other Ambulatory Visit: Payer: Self-pay

## 2024-01-17 ENCOUNTER — Encounter (HOSPITAL_COMMUNITY): Payer: Self-pay | Admitting: Emergency Medicine

## 2024-01-17 ENCOUNTER — Emergency Department (HOSPITAL_COMMUNITY)
Admission: EM | Admit: 2024-01-17 | Discharge: 2024-01-18 | Disposition: A | Discharge status: Home / Self Care | Attending: Emergency Medicine | Admitting: Emergency Medicine

## 2024-01-17 DIAGNOSIS — R350 Frequency of micturition: Secondary | ICD-10-CM | POA: Insufficient documentation

## 2024-01-17 DIAGNOSIS — Z79899 Other long term (current) drug therapy: Secondary | ICD-10-CM | POA: Insufficient documentation

## 2024-01-17 DIAGNOSIS — E1165 Type 2 diabetes mellitus with hyperglycemia: Secondary | ICD-10-CM | POA: Diagnosis not present

## 2024-01-17 DIAGNOSIS — Z7984 Long term (current) use of oral hypoglycemic drugs: Secondary | ICD-10-CM | POA: Diagnosis not present

## 2024-01-17 DIAGNOSIS — Z794 Long term (current) use of insulin: Secondary | ICD-10-CM | POA: Insufficient documentation

## 2024-01-17 DIAGNOSIS — R600 Localized edema: Secondary | ICD-10-CM | POA: Diagnosis not present

## 2024-01-17 DIAGNOSIS — I10 Essential (primary) hypertension: Secondary | ICD-10-CM | POA: Diagnosis not present

## 2024-01-17 DIAGNOSIS — R739 Hyperglycemia, unspecified: Secondary | ICD-10-CM | POA: Insufficient documentation

## 2024-01-17 DIAGNOSIS — Z7901 Long term (current) use of anticoagulants: Secondary | ICD-10-CM | POA: Insufficient documentation

## 2024-01-17 DIAGNOSIS — R3589 Other polyuria: Secondary | ICD-10-CM | POA: Insufficient documentation

## 2024-01-17 DIAGNOSIS — R110 Nausea: Secondary | ICD-10-CM | POA: Insufficient documentation

## 2024-01-17 LAB — URINALYSIS, ROUTINE W REFLEX MICROSCOPIC
Bacteria, UA: NONE SEEN
Bilirubin Urine: NEGATIVE
Glucose, UA: >=500 mg/dL — AB
Hgb urine dipstick: NEGATIVE
Ketones, ur: NEGATIVE mg/dL
Leukocytes,Ua: NEGATIVE
Nitrite: NEGATIVE
Protein, ur: NEGATIVE mg/dL
Specific Gravity, Urine: 1.02 (ref 1.005–1.030)
pH: 6 (ref 5.0–8.0)

## 2024-01-17 LAB — COMPREHENSIVE METABOLIC PANEL WITH GFR
ALT: 17 U/L (ref 0–44)
AST: 21 U/L (ref 15–41)
Albumin: 3.8 g/dL (ref 3.5–5.0)
Alkaline Phosphatase: 160 U/L — ABNORMAL HIGH (ref 38–126)
Anion gap: 16 — ABNORMAL HIGH (ref 5–15)
BUN: 45 mg/dL — ABNORMAL HIGH (ref 8–23)
CO2: 24 mmol/L (ref 22–32)
Calcium: 10 mg/dL (ref 8.9–10.3)
Chloride: 97 mmol/L — ABNORMAL LOW (ref 98–111)
Creatinine, Ser: 1.74 mg/dL — ABNORMAL HIGH (ref 0.44–1.00)
GFR, Estimated: 29 mL/min — ABNORMAL LOW (ref >60)
Glucose, Bld: 695 mg/dL (ref 70–99)
Potassium: 4.7 mmol/L (ref 3.5–5.1)
Sodium: 137 mmol/L (ref 135–145)
Total Bilirubin: 0.6 mg/dL (ref 0.0–1.2)
Total Protein: 8 g/dL (ref 6.5–8.1)

## 2024-01-17 LAB — CBC
HCT: 38.7 % (ref 36.0–46.0)
Hemoglobin: 13.3 g/dL (ref 12.0–15.0)
MCH: 26.9 pg (ref 26.0–34.0)
MCHC: 34.4 g/dL (ref 30.0–36.0)
MCV: 78.3 fL — ABNORMAL LOW (ref 80.0–100.0)
Platelets: 202 K/uL (ref 150–400)
RBC: 4.94 MIL/uL (ref 3.87–5.11)
RDW: 15.8 % — ABNORMAL HIGH (ref 11.5–15.5)
WBC: 4.6 K/uL (ref 4.0–10.5)
nRBC: 0 % (ref 0.0–0.2)

## 2024-01-17 LAB — CBG MONITORING, ED
Glucose-Capillary: 567 mg/dL (ref 70–99)
Glucose-Capillary: 537 mg/dL (ref 70–99)

## 2024-01-17 MED ORDER — INSULIN ASPART 100 UNIT/ML IJ SOLN
15.0000 [IU] | Freq: Once | INTRAMUSCULAR | Status: AC
  Administered 2024-01-17: 15 [IU] via SUBCUTANEOUS
  Filled 2024-01-17: qty 1

## 2024-01-17 MED ORDER — SODIUM CHLORIDE 0.9 % IV BOLUS
1000.0000 mL | Freq: Once | INTRAVENOUS | Status: AC
  Administered 2024-01-17: 1000 mL via INTRAVENOUS

## 2024-01-17 MED ORDER — INSULIN ASPART 100 UNIT/ML IJ SOLN
10.0000 [IU] | Freq: Once | INTRAMUSCULAR | Status: AC
  Administered 2024-01-17: 10 [IU] via SUBCUTANEOUS
  Filled 2024-01-17: qty 1

## 2024-01-17 NOTE — Discharge Instructions (Signed)
 You are seen in the emergency department for concern for low blood sugar.  Your blood sugar was quite elevated and likely you are having some mechanical problem with your glucose monitor.  You were given fluids and insulin  with some improvement in your sugar.  Please replace your Dexcom to see if you are getting better readings.  Call your primary care doctor tomorrow for close follow-up.  Return if any worsening or concerning symptoms

## 2024-01-17 NOTE — ED Triage Notes (Addendum)
 Pt via POV c/o HYPOglycemia, lowest at home 41. Pt has dexcom CGM; device is currently reading 51 in triage. ED glucometer shows CBG 567. Pt says her only symptom is nausea. Pt's daughter thinks the CGM sensor was replaced sometime last week. Daughter notes that pt has been urinating frequently.

## 2024-01-17 NOTE — ED Notes (Signed)
CBG: 567 

## 2024-01-17 NOTE — ED Provider Notes (Signed)
 [place] EMERGENCY DEPARTMENT AT [facility] Provider Note   CSN: [id number] Arrival date & time: 01/17/24  2026     Patient presents with: Hyperglycemia and Hypertension   Cassandra Adams is a 78 y.o. female.  She is brought in by family for evaluation of low blood sugar.  Her blood sugars been reading in the 40s and 50s today.  They have been trying to give her oral glucose to get up but it was not working.  At triage her blood sugar was found to be 500.  She denies any specific complaints other than some nausea.  She has also had frequent urination.  No fevers cough chest pain diarrhea.  She has been using the Dexcom for a few months and has not had any complications with it.  This module is been on for a few weeks.  {Add pertinent medical, surgical, social history, OB history to YEP:67052} The history is provided by the patient and a relative.  Hyperglycemia Blood sugar level PTA:  500 Diabetes status:  Controlled with insulin  Context: insulin  pump use   Associated symptoms: nausea and polyuria   Associated symptoms: no altered mental status, no blurred vision, no confusion, no fever, no shortness of breath and no vomiting   Hypertension Pertinent negatives include no shortness of breath.       Prior to Admission medications   Medication Sig Start Date End Date Taking? Authorizing Provider  acetaminophen  (TYLENOL ) 650 MG CR tablet Take 650 mg by mouth every 6 (six) hours as needed for pain.    [provider]  amLODipine  (NORVASC ) 10 MG tablet Take 1 tablet (10 mg total) by mouth daily. 11/05/23   Landy Barnie RAMAN, NP  atorvastatin  (LIPITOR ) 80 MG tablet Take 1 tablet (80 mg total) by mouth every evening. 11/05/23   Landy Barnie RAMAN, NP  BD PEN NEEDLE MINI ULTRAFINE 31G X 5 MM MISC USE TO INJECT INSULIN  4 TIMES DAILY. 01/02/24   Shamleffer, Ibtehal Jaralla, MD  carvedilol  (COREG ) 3.125 MG tablet Take 1 tablet (3.125 mg total) by mouth 2 (two) times daily  with a meal. 12/21/23   Hilty, Vinie BROCKS, MD  clopidogrel  (PLAVIX ) 75 MG tablet Take 75 mg by mouth daily. 11/20/23   [provider]  ELIQUIS  5 MG TABS tablet Take 1 tablet (5 mg total) by mouth 2 (two) times daily. 11/05/23   Landy Barnie RAMAN, NP  empagliflozin  (JARDIANCE ) 10 MG TABS tablet Take 1 tablet (10 mg total) by mouth daily. 11/05/23   Landy Barnie RAMAN, NP  furosemide  (LASIX ) 40 MG tablet Take 1 tablet (40 mg total) by mouth daily. 11/05/23 10/30/24  Landy Barnie RAMAN, NP  HYDROcodone -acetaminophen  (NORCO/VICODIN) 5-325 MG tablet Take 1 tablet by mouth every 6 (six) hours as needed for moderate pain (pain score 4-6). 11/22/23   Margrette Taft BRAVO, MD  insulin  aspart (NOVOLOG  FLEXPEN) 100 UNIT/ML FlexPen Inject 6 Units into the skin 3 (three) times daily with meals. 11/05/23   Landy Barnie RAMAN, NP  Insulin  Glargine (BASAGLAR  KWIKPEN) 100 UNIT/ML Inject 22 Units into the skin daily. 11/05/23   Landy Barnie RAMAN, NP  isosorbide  mononitrate (IMDUR ) 60 MG 24 hr tablet Take 1 tablet (60 mg total) by mouth daily. 11/05/23   Landy Barnie RAMAN, NP  mupirocin  ointment (BACTROBAN ) 2 % Apply 1 Application topically 2 (two) times daily. 01/04/24   Christine Rush, DPM  nitroGLYCERIN  (NITROSTAT ) 0.4 MG SL tablet Place 1 tablet (0.4 mg total) under the tongue every  5 (five) minutes as needed for chest pain. 11/05/23   Landy Barnie RAMAN, NP  polyethylene glycol (MIRALAX  / GLYCOLAX ) 17 g packet Take 17 g by mouth daily. 11/12/20   Rojelio Nest, DO  sacubitril -valsartan  (ENTRESTO ) 49-51 MG Take 1 tablet by mouth 2 (two) times daily. 11/05/23   Landy Barnie RAMAN, NP    Allergies: Penicillins    Review of Systems  Constitutional:  Negative for fever.  Eyes:  Negative for blurred vision.  Respiratory:  Negative for shortness of breath.   Gastrointestinal:  Positive for nausea. Negative for vomiting.  Endocrine: Positive for polyuria.  Psychiatric/Behavioral:  Negative for confusion.     Updated Vital Signs BP  (!) 206/98   Pulse 74   Temp 98.3 F (36.8 C) (Temporal)   Resp (!) 22   Ht 5' 6 (1.676 m)   Wt 78.9 kg   SpO2 93%   BMI 28.08 kg/m   Physical Exam Vitals and nursing note reviewed.  Constitutional:      General: She is not in acute distress.    Appearance: Normal appearance. She is well-developed.  HENT:     Head: Normocephalic and atraumatic.  Eyes:     Conjunctiva/sclera: Conjunctivae normal.  Cardiovascular:     Rate and Rhythm: Normal rate and regular rhythm.     Heart sounds: No murmur heard. Pulmonary:     Effort: Pulmonary effort is normal. No respiratory distress.     Breath sounds: Normal breath sounds. No stridor. No wheezing.  Abdominal:     Palpations: Abdomen is soft.     Tenderness: There is no abdominal tenderness. There is no guarding or rebound.  Musculoskeletal:     Cervical back: Neck supple.     Right lower leg: Edema present.     Left lower leg: Edema present.  Skin:    General: Skin is warm and dry.  Neurological:     General: No focal deficit present.     Mental Status: She is alert.     GCS: GCS eye subscore is 4. GCS verbal subscore is 5. GCS motor subscore is 6.     (all labs ordered are listed, but only abnormal results are displayed) Labs Reviewed  CBG MONITORING, ED - Abnormal; Notable for the following components:      Result Value   Glucose-Capillary 567 (*)    All other components within normal limits  CBC  URINALYSIS, ROUTINE W REFLEX MICROSCOPIC  COMPREHENSIVE METABOLIC PANEL WITH GFR  CBG MONITORING, ED    EKG: None  Radiology: No results found.  {Document cardiac monitor, telemetry assessment procedure when appropriate:32947} Procedures   Medications Ordered in the ED - No data to display    {Click here for ABCD2, HEART and other calculators REFRESH Note before signing:1}                              Medical Decision Making Amount and/or Complexity of Data Reviewed Labs: ordered.   This patient complains of  ***; this involves an extensive number of treatment Options and is a complaint that carries with it a high risk of complications and morbidity. The differential includes ***  I ordered, reviewed and interpreted labs, which included *** I ordered medication *** and reviewed PMP when indicated. I ordered imaging studies which included *** and I independently    visualized and interpreted imaging which showed *** Additional history obtained from *** Previous records obtained  and reviewed *** I consulted *** and discussed lab and imaging findings and discussed disposition.  Cardiac monitoring reviewed, *** Social determinants considered, *** Critical Interventions: ***  After the interventions stated above, I reevaluated the patient and found *** Admission and further testing considered, ***   {Document critical care time when appropriate  Document review of labs and clinical decision tools ie CHADS2VASC2, etc  Document your independent review of radiology images and any outside records  Document your discussion with family members, caretakers and with consultants  Document social determinants of health affecting pt's care  Document your decision making why or why not admission, treatments were needed:32947:::1}   Final diagnoses:  None    ED Discharge Orders     None

## 2024-01-18 LAB — CBG MONITORING, ED: Glucose-Capillary: 380 mg/dL — ABNORMAL HIGH (ref 70–99)

## 2024-01-18 NOTE — ED Provider Notes (Signed)
 Patient signed out to me by Dr. Towana.  Patient was hypoglycemic at home, given multiple doses of glucose and is now hyperglycemic.  She has been treated with fluids and insulin , blood sugar now in the 300s.  I am hesitant to go much lower at this point, discharge her to home and have her restart all of her meds this morning.  She appears well on recheck, eager to go home.  She can follow-up with primary care/endocrinology.   Haze Lonni PARAS, MD 01/18/24 [id number]

## 2024-01-25 ENCOUNTER — Encounter: Payer: Self-pay | Admitting: Internal Medicine

## 2024-01-25 ENCOUNTER — Ambulatory Visit: Admitting: Internal Medicine

## 2024-01-25 VITALS — BP 138/80 | HR 63 | Ht 66.0 in | Wt 175.0 lb

## 2024-01-25 DIAGNOSIS — N1832 Chronic kidney disease, stage 3b: Secondary | ICD-10-CM | POA: Diagnosis not present

## 2024-01-25 DIAGNOSIS — E1122 Type 2 diabetes mellitus with diabetic chronic kidney disease: Secondary | ICD-10-CM

## 2024-01-25 DIAGNOSIS — E1142 Type 2 diabetes mellitus with diabetic polyneuropathy: Secondary | ICD-10-CM

## 2024-01-25 DIAGNOSIS — E1159 Type 2 diabetes mellitus with other circulatory complications: Secondary | ICD-10-CM

## 2024-01-25 DIAGNOSIS — Z794 Long term (current) use of insulin: Secondary | ICD-10-CM

## 2024-01-25 LAB — POCT GLYCOSYLATED HEMOGLOBIN (HGB A1C): Hemoglobin A1C: 7.1 % — AB (ref 4.0–5.6)

## 2024-01-25 LAB — POCT GLUCOSE (DEVICE FOR HOME USE): POC Glucose: 194 mg/dL — AB (ref 70–99)

## 2024-01-25 MED ORDER — ACCU-CHEK GUIDE W/DEVICE KIT: 1.0000 | PACK | Freq: Every day | 0 refills | Status: AC

## 2024-01-25 MED ORDER — ACCU-CHEK GUIDE TEST VI STRP: 1.0000 | ORAL_STRIP | Freq: Every day | 12 refills | Status: AC

## 2024-01-25 NOTE — Progress Notes (Signed)
 Name: Cassandra Adams  MRN/ DOB: [id number], July 26, 1945   Age/ Sex: 78 y.o., female    PCP: Marvine Rush, MD   Reason for Endocrinology Evaluation: Type 2 Diabetes Mellitus     Date of Initial Endocrinology Visit: 12/04/2022    PATIENT IDENTIFIER: Cassandra Adams is a 78 y.o. female with a past medical history of DM, HTN, A-fib, CHF, CAD. The patient presented for initial endocrinology clinic visit on 12/04/2022 for consultative assistance with her diabetes management.    HPI: [name] is accompanied by her son   Diagnosed with DM years ago  Prior Medications tried/Intolerance: Limited glycemic agents due to low GFR Hemoglobin A1c has ranged from 8.1% in 2022, peaking at 11.0%   On her initial visit to our clinic she had an A1c of 8.5%, she was on Jardiance , Basaglar  and NovoLog  per correction scale..   We started Ozempic , provided a standing dose of NovoLog  with each meal plus correction scale  Ozempic  was not started by the patient was taken off her list as she was having GI issue 06/2023 SUBJECTIVE:   During the last visit (07/16/2023): A1c 9.1%  Today (01/25/24): Cassandra Adams is here for follow-up with diabetes management.  She checks her blood sugars multiple times daily through CGM. She is accompanied by her daughter.   Since her last visit here she has had multiple ED visits for variable reasons including ankle told BP, hypertensive emergency, and in April, 2025 she had altered mental status that was due to hypoglycemia with a BG reading of 44 Mg/DL, Basaglar  was reduced from 28 units to 22 units.  She presented again to the ED shortly after this time with hyperglycemia, serum glucose 439 Mg/DL.  Patient was diagnosed with closed fracture of the left proximal tibia by 10/11/2023   She follows with podiatry for onychomycosis, last visit 01/11/2024  Patient presented again to the ED 01/17/2024 with hypoglycemia with BG readings at home 40s and 50s, by  the time she presented to the ED she was found to have a BG reading of 500 mg/DL, serum glucose 304 Mg/DL.  Denies nausea or vomiting  Has noted abdominal pain with hyperglycemia  Has noted loose stools and diarrhea    HOME DIABETES REGIMEN: Jardiance  10 mg daily Basaglar  22 units daily in the morning  NovoLog  6 units 3 times daily before every meal     Statin: Yes ACE-I/ARB: Yes   CONTINUOUS GLUCOSE MONITORING RECORD INTERPRETATION    Dates of Recording: 7/19-01/25/2024  Sensor description:dexcom  Results statistics:   CGM use % of time 94  Average and SD 157/68  Time in range    66    %  % Time Above 180 21  % Time above 250 8  % Time Below target 2   Glycemic patterns summary: BGs are optimal overnight and fluctuate during the day  Hyperglycemic episodes postprandial  Hypoglycemic episodes occurred after bolus  Overnight periods: Optimal    DIABETIC COMPLICATIONS: Microvascular complications:  CKD III, neuropathy Denies:  Last eye exam: Completed 11/09/2022  Macrovascular complications:  CAD, CHF Denies: PVD, CVA   PAST HISTORY: Past Medical History:  Past Medical History:  Diagnosis Date   (HFpEF) heart failure with preserved ejection fraction (HCC)    Limited Echo 7/22: Mild asymmetrical LVH due to scar and thinning of basal posterior wall and background conc LVH, apical and mid segments hyperdynamic, EF 60-65, basal inf-lat and basal inf AK, normal RVSF, RVSP 26.6, mild LAE,  mild-moderate MR, mild AV sclerosis w/o AS // Echo 5/22: EF 55-60, RVSP 27, mild to mod LAE, poss small to mod eff; AV sclerosis no AS   Arthritis    all over   CAD (coronary artery disease)    a. NSTEMI 12/2013 - occluded dominant RCA with L-R collaterals, moderate prox segmental LAD disease, for medical therapy initially, EF 60% with subtle inferobasal hypokinesia. Consider PCI for refractory CP.   CKD (chronic kidney disease), stage III (HCC)    Hypertension    Migraines     weekly (01/06/2014)   Sickle cell trait (HCC)    TIA (transient ischemic attack) 1978   Type II diabetes mellitus (HCC)    Vertigo    Past Surgical History:  Past Surgical History:  Procedure Laterality Date   APPENDECTOMY  08/24/2012   had appendix frozen   CARDIAC CATHETERIZATION  years ago & 01/05/2014   CARDIOVERSION N/A 03/17/2014   Procedure: CARDIOVERSION;  Surgeon: Vinie KYM Maxcy, MD;  Location: [facility] ENDOSCOPY;  Service: Cardiovascular;  Laterality: N/A;   CARDIOVERSION N/A 01/14/2021   Procedure: CARDIOVERSION;  Surgeon: Maxcy Vinie BROCKS, MD;  Location: [facility] ENDOSCOPY;  Service: Cardiovascular;  Laterality: N/A;   CARDIOVERSION N/A 12/13/2021   Procedure: CARDIOVERSION;  Surgeon: Alveta Aleene PARAS, MD;  Location: [facility] ENDOSCOPY;  Service: Cardiovascular;  Laterality: N/A;   CATARACT EXTRACTION W/ INTRAOCULAR LENS  IMPLANT, BILATERAL Bilateral 04/2013-05/2013   CORONARY ATHERECTOMY N/A 08/05/2021   Procedure: CORONARY ATHERECTOMY;  Surgeon: Wonda Sharper, MD;  Location[facility] INVASIVE CV LAB;  Service: Cardiovascular;  Laterality: N/A;   CORONARY PRESSURE/FFR STUDY N/A 08/04/2021   Procedure: INTRAVASCULAR PRESSURE WIRE/FFR STUDY;  Surgeon: Mady Bruckner, MD;  Location: [facility] INVASIVE CV LAB;  Service: Cardiovascular;  Laterality: N/A;   CORONARY STENT INTERVENTION N/A 08/05/2021   Procedure: CORONARY STENT INTERVENTION;  Surgeon: Wonda Sharper, MD;  Location: [facility] INVASIVE CV LAB;  Service: Cardiovascular;  Laterality: N/A;   ENDARTERECTOMY Right 02/05/2017   Procedure: ENDARTERECTOMY CAROTID-RIGHT;  Surgeon: Harvey Carlin BRAVO, MD;  Location: [facility] OR;  Service: Vascular;  Laterality: Right;   ESOPHAGOGASTRODUODENOSCOPY  11/08/2007    Normal esophagus without evidence of Barrett, mass, erosion/ Normal stomach, duodenal bulb   ESOPHAGOGASTRODUODENOSCOPY (EGD) WITH PROPOFOL  N/A 11/08/2020   Procedure: ESOPHAGOGASTRODUODENOSCOPY (EGD) WITH PROPOFOL ;  Surgeon: Eda Iha, MD;   Location: [facility] ENDOSCOPY;  Service: Gastroenterology;  Laterality: N/A;   GASTRIC MOTILITY STUDY  11/13/2007   mildly delayed emptying subjectively, but normal  analysis-77% of tracer emptied at 2 hours   JOINT REPLACEMENT     LAPAROSCOPIC APPENDECTOMY N/A 09/15/2012   Procedure: APPENDECTOMY LAPAROSCOPIC;  Surgeon: Oneil DELENA Budge, MD;  Location: [facility] ORS;  Service: General;  Laterality: N/A;   LEFT HEART CATH AND CORONARY ANGIOGRAPHY N/A 08/04/2021   Procedure: LEFT HEART CATH AND CORONARY ANGIOGRAPHY;  Surgeon: Mady Bruckner, MD;  Location: [facility] INVASIVE CV LAB;  Service: Cardiovascular;  Laterality: N/A;   LEFT HEART CATHETERIZATION WITH CORONARY ANGIOGRAM N/A 01/05/2014   Procedure: LEFT HEART CATHETERIZATION WITH CORONARY ANGIOGRAM;  Surgeon: Dorn PARAS Lesches, MD;  Location: [facility] CATH LAB;  Service: Cardiovascular;  Laterality: N/A;   REPLACEMENT TOTAL KNEE Right 05/03/2006   TEE WITHOUT CARDIOVERSION N/A 03/17/2014   Procedure: TRANSESOPHAGEAL ECHOCARDIOGRAM (TEE);  Surgeon: Vinie KYM Maxcy, MD;  Location: [facility] ENDOSCOPY;  Service: Cardiovascular;  Laterality: N/A;   TONSILLECTOMY  06/26/1970    Social History:  reports that she has never smoked. She has never used smokeless tobacco. She reports current alcohol use.  She reports that she does not use drugs. Family History:  Family History  Problem Relation Age of Onset   Hyperlipidemia Mother    Hypertension Mother    Heart attack Mother    Cancer Father        lung   Hypertension Sister    Diabetes Brother    Heart disease Brother    Hyperlipidemia Brother    Hypertension Brother    Heart attack Brother    Other Brother        DVT     HOME MEDICATIONS: Allergies as of 01/25/2024       Reactions   Penicillins Hives        Medication List        Accurate as of January 25, 2024  1:06 PM. If you have any questions, ask your nurse or doctor.          Accu-Chek Guide Test test strip Generic drug: glucose blood 1 each by  Other route daily in the afternoon. Use as instructed Started by: Johan Antonacci J Cassiopeia Florentino   Accu-Chek Guide w/Device Kit 1 Device by Does not apply route daily in the afternoon. Started by: Donell PARAS Shakeya Kerkman   acetaminophen  650 MG CR tablet Commonly known as: TYLENOL  Take 650 mg by mouth every 6 (six) hours as needed for pain.   amLODipine  10 MG tablet Commonly known as: NORVASC  Take 1 tablet (10 mg total) by mouth daily.   atorvastatin  80 MG tablet Commonly known as: LIPITOR  Take 1 tablet (80 mg total) by mouth every evening.   Basaglar  KwikPen 100 UNIT/ML Inject 22 Units into the skin daily.   BD Pen Needle Mini Ultrafine 31G X 5 MM Misc Generic drug: Insulin  Pen Needle USE TO INJECT INSULIN  4 TIMES DAILY.   carvedilol  3.125 MG tablet Commonly known as: COREG  Take 1 tablet (3.125 mg total) by mouth 2 (two) times daily with a meal.   clopidogrel  75 MG tablet Commonly known as: PLAVIX  Take 75 mg by mouth daily.   Eliquis  5 MG Tabs tablet Generic drug: apixaban  Take 1 tablet (5 mg total) by mouth 2 (two) times daily.   empagliflozin  10 MG Tabs tablet Commonly known as: Jardiance  Take 1 tablet (10 mg total) by mouth daily.   Entresto  49-51 MG Generic drug: sacubitril -valsartan  Take 1 tablet by mouth 2 (two) times daily.   furosemide  40 MG tablet Commonly known as: LASIX  Take 1 tablet (40 mg total) by mouth daily.   HYDROcodone -acetaminophen  5-325 MG tablet Commonly known as: NORCO/VICODIN Take 1 tablet by mouth every 6 (six) hours as needed for moderate pain (pain score 4-6).   isosorbide  mononitrate 60 MG 24 hr tablet Commonly known as: IMDUR  Take 1 tablet (60 mg total) by mouth daily.   mupirocin  ointment 2 % Commonly known as: BACTROBAN  Apply 1 Application topically 2 (two) times daily.   nitroGLYCERIN  0.4 MG SL tablet Commonly known as: NITROSTAT  Place 1 tablet (0.4 mg total) under the tongue every 5 (five) minutes as needed for chest pain.    NovoLOG  FlexPen 100 UNIT/ML FlexPen Generic drug: insulin  aspart Inject 6 Units into the skin 3 (three) times daily with meals.   polyethylene glycol 17 g packet Commonly known as: MIRALAX  / GLYCOLAX  Take 17 g by mouth daily.         ALLERGIES: Allergies  Allergen Reactions   Penicillins Hives     REVIEW OF SYSTEMS: A comprehensive ROS was conducted with the patient and is negative except as  per HPI    OBJECTIVE:   VITAL SIGNS: BP 138/80 (BP Location: Left Arm, Patient Position: Sitting, Cuff Size: Normal)   Pulse 63   Ht 5' 6 (1.676 m)   Wt 175 lb (79.4 kg)   SpO2 97%   BMI 28.25 kg/m    PHYSICAL EXAM:  General: Pt appears well and is in NAD  Lungs: Clear with good BS bilat   Heart: RRR   Extremities:  Lower extremities -Trace edema on the left with a knee brace    Neuro: MS is good with appropriate affect, pt is alert and Ox3    DM foot exam: 01/11/2024 per podiatry    DATA REVIEWED:  Lab Results  Component Value Date   HGBA1C 7.1 (A) 01/25/2024   HGBA1C 7.8 (H) 09/28/2023   HGBA1C 9.1 (A) 07/16/2023     Latest Reference Range & Units 01/17/24 20:51  Sodium 135 - 145 mmol/L 137  Potassium 3.5 - 5.1 mmol/L 4.7  Chloride 98 - 111 mmol/L 97 (L)  CO2 22 - 32 mmol/L 24  Glucose 70 - 99 mg/dL 304 (HH)  BUN 8 - 23 mg/dL 45 (H)  Creatinine 9.55 - 1.00 mg/dL 8.25 (H)  Calcium  8.9 - 10.3 mg/dL 89.9  Anion gap 5 - 15  16 (H)  Alkaline Phosphatase 38 - 126 U/L 160 (H)  Albumin 3.5 - 5.0 g/dL 3.8  AST 15 - 41 U/L 21  ALT 0 - 44 U/L 17  Total Protein 6.5 - 8.1 g/dL 8.0  Total Bilirubin 0.0 - 1.2 mg/dL 0.6  GFR, Estimated >39 mL/min 29 (L)     Old records , labs and images have been reviewed.   ASSESSMENT / PLAN / RECOMMENDATIONS:   1) Type 2 Diabetes Mellitus, poorly controlled, With CKD III , neuropathic and macrovascular complications - Most recent A1c of 7.1%. Goal A1c < 7.0 %.     -A1c is trending down 7.1% -Patient with glycemic excursions  requiring multiple ED visits -Per daughter, the patient will take prandial insulin  at a certain time of the day without necessarily concerning with mealtime, we discussed the importance of taking NovoLog  5 minutes before each meal to prevent hypo and hyperglycemia -We also discussed the proper way of correcting for hypoglycemia, as the patient tends to overcorrect -I have attempted to prescribe Ozempic  in the past but she did not started as she was having GI side effects - I would not change her basal insulin  at this time - A prescription for Accu-Chek guide meter and strips were sent to the pharmacy to be used as needed despite having the Dexcom - I will adjust her prandial insulin  as below, again emphasized the importance of using this before the meal  MEDICATIONS:  Continue Jardiance  10 mg daily Continue Basaglar  22 units daily Novolog  correctional insulin  Use the scale below to help guide you BEFORE each meal   Blood sugar before meal Number of units to inject  Less than 100 0 unit  100-  170 3 units  171 - 210 4 units  211 - 250 5 units  251- 290 6 units  291 - 330 7 units  331 - 370 8 units  371 - 410 9 units  411 - 450 10 units       EDUCATION / INSTRUCTIONS: BG monitoring instructions: Patient is instructed to check her blood sugars 3 times a day. Call [facility] if: BG persistently < 70  I reviewed the Rule of 15  for the treatment of hypoglycemia in detail with the patient. Literature supplied.   2) Diabetic complications:  Eye: Does  have known diabetic retinopathy.  Neuro/ Feet: Does  have known diabetic peripheral neuropathy. Renal: Patient does  have known baseline CKD. She is under  the care under the care of nephrology     Follow-up in 4 months  I spent 25 minutes preparing to see the patient by review of recent labs, imaging and procedures, obtaining and reviewing separately obtained history, communicating with the patient/family or  caregiver, ordering medications, tests or procedures, and documenting clinical information in the EHR including the differential Dx, treatment, and any further evaluation and other management    Signed electronically by: Stefano Redgie Butts, MD  [facility]  [facility] [address] [place] [place], [place] [address] Phone: [phone] FAX: [phone]   CC: Marvine Rush, MD [address] [place] [address] Phone: [phone]  Fax: [phone]    Return to Endocrinology clinic as below: Future Appointments  Date Time Provider Department Center  01/31/2024 10:15 AM Margrette Taft BRAVO, MD [facility] None  02/05/2024  1:00 PM Leodis Greig RAMAN, PT [facility] None  03/24/2024  4:20 PM Mona Vinie BROCKS, MD [facility]  04/14/2024  4:15 PM Christine Rush, DPM [facility]  05/30/2024  2:40 PM Deaglan Lile, Donell Redgie, MD [facility] None

## 2024-01-25 NOTE — Patient Instructions (Addendum)
 Continue Jardiance  10 mg daily Continue  Basaglar  22 units once daily  Novolog  correctional insulin  Use the scale below to help guide you BEFORE each meal   Blood sugar before meal Number of units to inject  Less than 100 0 unit  100-  170 3 units  171 - 210 4 units  211 - 250 5 units  251- 290 6 units  291 - 330 7 units  331 - 370 8 units  371 - 410 9 units  411 - 450 10 units     - HOW TO TREAT LOW BLOOD SUGARS (Blood sugar LESS THAN 70 MG/DL) Please follow the RULE OF 15 for the treatment of hypoglycemia treatment (when your (blood sugars are less than 70 mg/dL)   STEP 1: Take 15 grams of carbohydrates when your blood sugar is low, which includes:  3-4 GLUCOSE TABS  OR 3-4 OZ OF JUICE OR REGULAR SODA OR ONE TUBE OF GLUCOSE GEL    STEP 2: RECHECK blood sugar in 15 MINUTES STEP 3: If your blood sugar is still low at the 15 minute recheck --> then, go back to STEP 1 and treat AGAIN with another 15 grams of carbohydrates. - HOW TO TREAT LOW BLOOD SUGARS (Blood sugar LESS THAN 70 MG/DL) Please follow the RULE OF 15 for the treatment of hypoglycemia treatment (when your (blood sugars are less than 70 mg/dL)   STEP 1: Take 15 grams of carbohydrates when your blood sugar is low, which includes:  3-4 GLUCOSE TABS  OR 3-4 OZ OF JUICE OR REGULAR SODA OR ONE TUBE OF GLUCOSE GEL    STEP 2: RECHECK blood sugar in 15 MINUTES STEP 3: If your blood sugar is still low at the 15 minute recheck --> then, go back to STEP 1 and treat AGAIN with another 15 grams of carbohydrates.

## 2024-01-28 ENCOUNTER — Encounter: Payer: Self-pay | Admitting: Internal Medicine

## 2024-01-31 ENCOUNTER — Other Ambulatory Visit (INDEPENDENT_AMBULATORY_CARE_PROVIDER_SITE_OTHER): Payer: Self-pay

## 2024-01-31 ENCOUNTER — Ambulatory Visit (INDEPENDENT_AMBULATORY_CARE_PROVIDER_SITE_OTHER): Admitting: Orthopedic Surgery

## 2024-01-31 DIAGNOSIS — S82192D Other fracture of upper end of left tibia, subsequent encounter for closed fracture with routine healing: Secondary | ICD-10-CM

## 2024-01-31 NOTE — Patient Instructions (Signed)
 Physical therapy has been ordered for you at [facility]. They should call you to schedule, [phone] is the phone number to call, if you want to call to schedule.

## 2024-01-31 NOTE — Progress Notes (Addendum)
  Fracture care follow-up  Proximal tibia fracture  Month #4 Chief Complaint  Patient presents with   Leg Injury    Left prox tibia     Encounter Diagnosis  Name Primary?   Other closed fracture of proximal end of left tibia with routine healing, subsequent encounter 10/11/23 Yes    DOI/DOS/ Date: 10/11/23  The patient is wearing a brace and using a walker she finished her in-home physical therapy transition to outpatient in the coming days  She has made significant improvement in her range of motion and although she complains of pain she does not want to take any medication so we recommended Tylenol   She has a slight flexion contracture in the left knee the knee flexion arc is from 3 degrees up to 120 degrees  DG Knee 1-2 Views Left Result Date: 01/31/2024 Fracture care follow-up 2 views left knee Injury date 10/11/2023 X-ray shows a proximal metaphyseal diaphyseal fracture of the left tibia There is excellent sclerotic bone around the fracture site slight medial collapse Slight apex anterior angulation on the lateral view.  Fracture is healing.  Minimal angulation no significant displacement Impression healing fracture proximal metaphyseal diaphyseal area left tibial shaft     Recommend physical therapy and x-ray in 2 months she can stop using the brace as long as she uses the walker

## 2024-01-31 NOTE — Progress Notes (Signed)
  Chief Complaint  Patient presents with   Leg Injury    Left prox tibia     Encounter Diagnosis  Name Primary?   Other closed fracture of proximal end of left tibia with routine healing, subsequent encounter 10/11/23 Yes    DOI/DOS/ Date: 10/11/23

## 2024-02-04 NOTE — Addendum Note (Signed)
 Addended by: MARGRETTE TAFT BRAVO on: 02/04/2024 02:04 PM   Modules accepted: Level of Service

## 2024-02-05 ENCOUNTER — Other Ambulatory Visit: Payer: Self-pay

## 2024-02-05 ENCOUNTER — Ambulatory Visit (HOSPITAL_COMMUNITY): Attending: Orthopedic Surgery

## 2024-02-05 DIAGNOSIS — S8262XD Displaced fracture of lateral malleolus of left fibula, subsequent encounter for closed fracture with routine healing: Secondary | ICD-10-CM | POA: Diagnosis not present

## 2024-02-05 DIAGNOSIS — S8262XS Displaced fracture of lateral malleolus of left fibula, sequela: Secondary | ICD-10-CM | POA: Diagnosis not present

## 2024-02-05 DIAGNOSIS — S82102D Unspecified fracture of upper end of left tibia, subsequent encounter for closed fracture with routine healing: Secondary | ICD-10-CM | POA: Diagnosis not present

## 2024-02-05 DIAGNOSIS — R262 Difficulty in walking, not elsewhere classified: Secondary | ICD-10-CM | POA: Insufficient documentation

## 2024-02-05 DIAGNOSIS — S82192D Other fracture of upper end of left tibia, subsequent encounter for closed fracture with routine healing: Secondary | ICD-10-CM | POA: Diagnosis not present

## 2024-02-05 DIAGNOSIS — M6281 Muscle weakness (generalized): Secondary | ICD-10-CM | POA: Diagnosis not present

## 2024-02-05 NOTE — Therapy (Signed)
 OUTPATIENT PHYSICAL THERAPY LOWER EXTREMITY EVALUATION   Patient Name: Cassandra Adams MRN: [id number] DOB:1945/12/10, 78 y.o., female Today's Date: 02/05/2024  END OF SESSION:  PT End of Session - 02/05/24 1301     Visit Number 1    Number of Visits 6    Date for PT Re-Evaluation 03/21/24    Authorization Type [employer]    PT Start Time 1300    PT Stop Time 1340    PT Time Calculation (min) 40 min    Activity Tolerance Patient tolerated treatment well    Behavior During Therapy [facility] for tasks assessed/performed          Past Medical History:  Diagnosis Date   (HFpEF) heart failure with preserved ejection fraction (HCC)    Limited Echo 7/22: Mild asymmetrical LVH due to scar and thinning of basal posterior wall and background conc LVH, apical and mid segments hyperdynamic, EF 60-65, basal inf-lat and basal inf AK, normal RVSF, RVSP 26.6, mild LAE, mild-moderate MR, mild AV sclerosis w/o AS // Echo 5/22: EF 55-60, RVSP 27, mild to mod LAE, poss small to mod eff; AV sclerosis no AS   Arthritis    all over   CAD (coronary artery disease)    a. NSTEMI 12/2013 - occluded dominant RCA with L-R collaterals, moderate prox segmental LAD disease, for medical therapy initially, EF 60% with subtle inferobasal hypokinesia. Consider PCI for refractory CP.   CKD (chronic kidney disease), stage III (HCC)    Hypertension    Migraines    weekly (01/06/2014)   Sickle cell trait (HCC)    TIA (transient ischemic attack) 1978   Type II diabetes mellitus (HCC)    Vertigo    Past Surgical History:  Procedure Laterality Date   APPENDECTOMY  08/24/2012   had appendix frozen   CARDIAC CATHETERIZATION  years ago & 01/05/2014   CARDIOVERSION N/A 03/17/2014   Procedure: CARDIOVERSION;  Surgeon: Vinie KYM Maxcy, MD;  Location: [facility] ENDOSCOPY;  Service: Cardiovascular;  Laterality: N/A;   CARDIOVERSION N/A 01/14/2021   Procedure: CARDIOVERSION;  Surgeon: Maxcy Vinie BROCKS, MD;  Location:  [facility] ENDOSCOPY;  Service: Cardiovascular;  Laterality: N/A;   CARDIOVERSION N/A 12/13/2021   Procedure: CARDIOVERSION;  Surgeon: Alveta Aleene PARAS, MD;  Location: [facility] ENDOSCOPY;  Service: Cardiovascular;  Laterality: N/A;   CATARACT EXTRACTION W/ INTRAOCULAR LENS  IMPLANT, BILATERAL Bilateral 04/2013-05/2013   CORONARY ATHERECTOMY N/A 08/05/2021   Procedure: CORONARY ATHERECTOMY;  Surgeon: Wonda Sharper, MD;  Location: [facility] INVASIVE CV LAB;  Service: Cardiovascular;  Laterality: N/A;   CORONARY PRESSURE/FFR STUDY N/A 08/04/2021   Procedure: INTRAVASCULAR PRESSURE WIRE/FFR STUDY;  Surgeon: Mady Bruckner, MD;  Location: [facility] INVASIVE CV LAB;  Service: Cardiovascular;  Laterality: N/A;   CORONARY STENT INTERVENTION N/A 08/05/2021   Procedure: CORONARY STENT INTERVENTION;  Surgeon: Wonda Sharper, MD;  Location: [facility] INVASIVE CV LAB;  Service: Cardiovascular;  Laterality: N/A;   ENDARTERECTOMY Right 02/05/2017   Procedure: ENDARTERECTOMY CAROTID-RIGHT;  Surgeon: Harvey Carlin BRAVO, MD;  Location: [facility] OR;  Service: Vascular;  Laterality: Right;   ESOPHAGOGASTRODUODENOSCOPY  11/08/2007    Normal esophagus without evidence of Barrett, mass, erosion/ Normal stomach, duodenal bulb   ESOPHAGOGASTRODUODENOSCOPY (EGD) WITH PROPOFOL  N/A 11/08/2020   Procedure: ESOPHAGOGASTRODUODENOSCOPY (EGD) WITH PROPOFOL ;  Surgeon: Eda Iha, MD;  Location: [facility] ENDOSCOPY;  Service: Gastroenterology;  Laterality: N/A;   GASTRIC MOTILITY STUDY  11/13/2007   mildly delayed emptying subjectively, but normal  analysis-77% of tracer emptied at 2 hours   JOINT  REPLACEMENT     LAPAROSCOPIC APPENDECTOMY N/A 09/15/2012   Procedure: APPENDECTOMY LAPAROSCOPIC;  Surgeon: Oneil DELENA Budge, MD;  Location: [facility] ORS;  Service: General;  Laterality: N/A;   LEFT HEART CATH AND CORONARY ANGIOGRAPHY N/A 08/04/2021   Procedure: LEFT HEART CATH AND CORONARY ANGIOGRAPHY;  Surgeon: Mady Bruckner, MD;  Location: [facility] INVASIVE CV LAB;  Service:  Cardiovascular;  Laterality: N/A;   LEFT HEART CATHETERIZATION WITH CORONARY ANGIOGRAM N/A 01/05/2014   Procedure: LEFT HEART CATHETERIZATION WITH CORONARY ANGIOGRAM;  Surgeon: Dorn JINNY Lesches, MD;  Location: [facility] CATH LAB;  Service: Cardiovascular;  Laterality: N/A;   REPLACEMENT TOTAL KNEE Right 05/03/2006   TEE WITHOUT CARDIOVERSION N/A 03/17/2014   Procedure: TRANSESOPHAGEAL ECHOCARDIOGRAM (TEE);  Surgeon: Vinie KYM Maxcy, MD;  Location: [facility] ENDOSCOPY;  Service: Cardiovascular;  Laterality: N/A;   TONSILLECTOMY  06/26/1970   Patient Active Problem List   Diagnosis Date Noted   Pain in left hip 12/11/2023   Hypertension with congestive heart failure and renal failure (HCC) 10/16/2023   Aortic atherosclerosis (HCC) 10/16/2023   Hyperlipidemia associated with type 2 diabetes mellitus (HCC) 10/16/2023   Stage 3b chronic kidney disease due to type 2 diabetes mellitus (HCC) 10/16/2023   Chronic constipation 10/16/2023   Closed fracture of neck of fibula 10/12/2023   Closed fracture of left proximal tibia 10/11/2023   Acute metabolic encephalopathy 09/28/2023   Chronic kidney disease, stage 3b (HCC) 09/28/2023   Uncontrolled type 2 diabetes mellitus with hyperglycemia, with long-term current use of insulin  (HCC) 09/28/2023   Hypertensive emergency 09/17/2023   Troponin I above reference range 09/17/2023   Leukopenia 09/17/2023   Chronic systolic CHF (congestive heart failure) (HCC) 09/17/2023   Hypertensive heart and chronic kidney disease with heart failure and stage 1 through stage 4 chronic kidney disease, or unspecified chronic kidney disease (HCC) 06/27/2023   Migraine, unspecified, not intractable, without status migrainosus 06/27/2023   Polyosteoarthritis, unspecified 06/27/2023   Prsnl hx of TIA (TIA), and cereb infrc w/o resid deficits 06/27/2023   Sickle cell trait (HCC) 06/27/2023   Type 2 diabetes mellitus with diabetic chronic kidney disease (HCC) 06/27/2023   Nausea &  vomiting 04/11/2022   Severe nonproliferative diabetic retinopathy of right eye without macular edema associated with type 2 diabetes mellitus (HCC) 02/06/2022   Severe nonproliferative diabetic retinopathy of left eye, with macular edema, associated with type 2 diabetes mellitus (HCC) 02/06/2022   Mixed hyperlipidemia 09/15/2021   Chronic heart failure with preserved ejection fraction (HFpEF) (HCC) 09/14/2021   Secondary hypercoagulable state (HCC) 01/25/2021   Microcytic anemia 01/16/2021   Persistent atrial fibrillation (HCC)    Gastric nodule    Acute blood loss anemia 11/07/2020   Hematemesis with nausea 11/07/2020   Elevated troponin 11/07/2020   Syncope and collapse 11/05/2020   CAD (coronary artery disease)    Pain in left knee 07/17/2019   Benign paroxysmal positional vertigo 05/11/2019   Chronic headaches 05/11/2019   Right hemiparesis (HCC) 05/05/2019   Stage 3b chronic kidney disease (CKD) (HCC) 05/05/2019   Left-sided weakness 05/04/2019   Type 2 diabetes mellitus with stage 3 chronic kidney disease, with long-term current use of insulin  (HCC) 06/11/2017   Acute kidney injury superimposed on stage 4 chronic kidney disease (HCC) 01/19/2017   Left leg weakness 01/19/2017   Acute nontraumatic kidney injury (HCC) 01/19/2017   Dyslipidemia 11/24/2016   Hypoglycemia 02/18/2016   Bradycardia 04/22/2015   Coronary artery disease due to lipid rich plaque 02/26/2014   Occlusion and stenosis of  carotid artery without mention of cerebral infarction 11/21/2013   Pain in limb-Left neck 11/21/2013   Carotid stenosis, bilateral 11/17/2011   Subclinical hyperthyroidism 02/15/2010   GERD 02/15/2010   Dysphagia 02/15/2010   Essential hypertension 02/11/2010    PCP: Marvine Rush MD  REFERRING PROVIDER: Margrette Taft BRAVO, MD  REFERRING DIAG: [phone] (ICD-10-CM) - Other closed fracture of proximal end of left tibia with routine healing, subsequent encounter  THERAPY DIAG:   Closed fracture of proximal end of left tibia with routine healing, unspecified fracture morphology, subsequent encounter - Plan: PT plan of care cert/re-cert  Difficulty in walking, not elsewhere classified - Plan: PT plan of care cert/re-cert  Rationale for Evaluation and Treatment: Rehabilitation  ONSET DATE: 10/11/23  SUBJECTIVE:   SUBJECTIVE STATEMENT: Was at [employer]; legs felt weak.  Fell backwards and broke her leg; was using a walker at baseline.  Able to get rid of brace per Dr. Margrette at last visit and arrives with RW.  Leg is still weak and swollen and hurts some.  Went from [facility] to [facility] and then to her daughters home; Was living alone but now staying with her daughter as she recovers.  PERTINENT HISTORY: HTN CHF Afib Sickle cell CAD CKD L knee OA PAIN:  Are you having pain? Yes: NPRS scale: 0-8/10 Pain location: back of the knee Pain description: tight and stiff Aggravating factors: bend too much Relieving factors: moving  PRECAUTIONS: Fall    WEIGHT BEARING RESTRICTIONS: No  FALLS:  Has patient fallen in last 6 months? Yes. Number of falls 1   OCCUPATION: retired  PLOF: Independent with household mobility with device  PATIENT GOALS: walk better  NEXT MD VISIT: 2 months sees Dr. Margrette  OBJECTIVE:  Note: Objective measures were completed at Evaluation unless otherwise noted.  DIAGNOSTIC FINDINGS: Fracture care follow-up 2 views left knee   Injury date 10/11/2023   X-ray shows a proximal metaphyseal diaphyseal fracture of the left tibia   There is excellent sclerotic bone around the fracture site slight medial collapse   Slight apex anterior angulation on the lateral view.  Fracture is healing.  Minimal angulation no significant displacement   Impression healing fracture proximal metaphyseal diaphyseal area left tibial shaft    PATIENT SURVEYS:  LEFS  Extreme difficulty/unable (0), Quite a bit of difficulty (1), Moderate  difficulty (2), Little difficulty (3), No difficulty (4) Survey date:    Any of your usual work, housework or school activities   2. Usual hobbies, recreational or sporting activities   3. Getting into/out of the bath   4. Walking between rooms   5. Putting on socks/shoes   6. Squatting    7. Lifting an object, like a bag of groceries from the floor   8. Performing light activities around your home   9. Performing heavy activities around your home   10. Getting into/out of a car   11. Walking 2 blocks   12. Walking 1 mile   13. Going up/down 10 stairs (1 flight)   14. Standing for 1 hour   15.  sitting for 1 hour   16. Running on even ground   17. Running on uneven ground   18. Making sharp turns while running fast   19. Hopping    20. Rolling over in bed   Score total:  38/80 47.5%     COGNITION: Overall cognitive status: Within functional limits for tasks assessed     SENSATION: WFL  EDEMA:  Mild  left knee and upper leg   PALPATION: Swelling from left knee down general soreness left knee more in posterior joint line area  LOWER EXTREMITY ROM:  Active ROM Right eval Left eval  Hip flexion    Hip extension    Hip abduction    Hip adduction    Hip internal rotation    Hip external rotation    Knee flexion  105  Knee extension  -10  Ankle dorsiflexion    Ankle plantarflexion    Ankle inversion    Ankle eversion     (Blank rows = not tested)  LOWER EXTREMITY MMT:  MMT Right eval Left eval  Hip flexion 4+ 4-  Hip extension    Hip abduction    Hip adduction    Hip internal rotation    Hip external rotation    Knee flexion    Knee extension 5 3-  Ankle dorsiflexion 5 4+  Ankle plantarflexion    Ankle inversion    Ankle eversion     (Blank rows = not tested)  FUNCTIONAL TESTS:  5 times sit to stand: 39.94 sec using hands to assist; left foot forward 2 minute walk test: 72 ft with RW  GAIT: Distance walked: 72 ft Assistive device utilized:  Environmental consultant - 2 wheeled Level of assistance: SBA Comments: noted extension lag left knee                                                                                                                                TREATMENT DATE: 02/05/24 physical therapy evaluation and HEP instruction    PATIENT EDUCATION:  Education details: Patient educated on exam findings, POC, scope of PT, HEP, and what to expect next visit. Person educated: Patient Education method: Explanation, Demonstration, and Handouts Education comprehension: verbalized understanding, returned demonstration, verbal cues required, and tactile cues required  HOME EXERCISE PROGRAM: Access Code: [email or web address] URL: [email or web address] Date: 02/05/2024 Prepared by: [facility] - Rehab  Exercises - Supine Quad Set  - 2 x daily - 7 x weekly - 1 sets - 10 reps - Supine Knee Extension Stretch on Towel Roll  - 2 x daily - 7 x weekly - 1 sets - 1 reps - 3 min hold - Supine Heel Slide  - 1 x daily - 7 x weekly - 1 sets - 10 reps - Sit to Stand with Armchair  - 1 x daily - 7 x weekly - 1 sets - 10 reps  ASSESSMENT:  CLINICAL IMPRESSION: Patient is a 78 y.o. female who was seen today for physical therapy evaluation and treatment for S82.192D (ICD-10-CM) - Other closed fracture of proximal end of left tibia with routine healing, subsequent encounterS82.192D (ICD-10-CM) - Other closed fracture of proximal end of left tibia with routine healing, subsequent encounter. Patient demonstrates muscle weakness, reduced ROM, and fascial restrictions which are likely contributing to symptoms of pain and are negatively impacting patient ability to perform ADLs and  functional mobility tasks. Patient will benefit from skilled physical therapy services to address these deficits to reduce pain and improve level of function with ADLs and functional mobility tasks.   OBJECTIVE IMPAIRMENTS: Abnormal gait, decreased activity tolerance, difficulty walking,  decreased ROM, decreased strength, increased edema, increased fascial restrictions, impaired perceived functional ability, and pain.   ACTIVITY LIMITATIONS: carrying, lifting, bending, standing, squatting, sleeping, transfers, bed mobility, and locomotion level  PARTICIPATION LIMITATIONS: meal prep, cleaning, shopping, and community activity  REHAB POTENTIAL: Good  CLINICAL DECISION MAKING: Evolving/moderate complexity  EVALUATION COMPLEXITY: Moderate   GOALS: Goals reviewed with patient? No  SHORT TERM GOALS: Target date: 02/26/2024 patient will be independent with initial HEP  Baseline: Goal status: INITIAL  2.  Patient will report 30% improvement overall  Baseline:  Goal status: INITIAL  3.  Patient will increase left knee mobility to -5 to 120 to promote normal navigation of steps; step over step pattern  Baseline: see above Goal status: INITIAL  LONG TERM GOALS: Target date: 0926/2025  Patient will be independent in self management strategies to improve quality of life and functional outcomes.  Baseline:  Goal status: INITIAL  2.  Patient will report 50% improvement overall  Baseline:  Goal status: INITIAL  3.  Patient will improve 5 times sit to stand score to 25 sec or less to demonstrate improved functional mobility and increased leg strength.    Baseline: 39.94 sec  Goal status: INITIAL  4.   Patient will increase left leg MMT's to 4+ to 5/5 to allow navigation of steps without gait deviation or loss of balance  Baseline: see above Goal status: INITIAL  5.  Patient will increase distance on 2 MWT to 150 ft or more with RW to demonstrate improved speed and efficiency with household and community ambulation Baseline: 72 ft Goal status: INITIAL  PLAN:  PT FREQUENCY: 1x/week  PT DURATION: 6 weeks  PLANNED INTERVENTIONS: 97164- PT Re-evaluation, 97110-Therapeutic exercises, 97530- Therapeutic activity, 97112- Neuromuscular re-education, 97535- Self  Care, [id number]- Manual therapy, [id number]- Gait training, [phone]- Orthotic Fit/training, [phone]- Canalith repositioning, [id number]- Aquatic Therapy, 97760- Splinting, 97597- Wound care (first 20 sq cm), 97598- Wound care (each additional 20 sq cm)Patient/Family education, Balance training, Stair training, Taping, Dry Needling, Joint mobilization, Joint manipulation, Spinal manipulation, Spinal mobilization, Scar mobilization, and DME instructions.    PLAN FOR NEXT SESSION: Review of Hep and goals; asked for 1 x a week as she relies on others for transportation; left leg/knee mobility and strength; gait and balance training.  Uses walker at baseline.  Issued information to order compression garment for left leg.   Haivyn Oravec Small Chaney Maclaren MPT [facility] [place] [phone]

## 2024-02-14 ENCOUNTER — Ambulatory Visit (HOSPITAL_COMMUNITY)

## 2024-02-14 ENCOUNTER — Encounter (HOSPITAL_COMMUNITY): Payer: Self-pay

## 2024-02-14 DIAGNOSIS — S82102D Unspecified fracture of upper end of left tibia, subsequent encounter for closed fracture with routine healing: Secondary | ICD-10-CM | POA: Diagnosis not present

## 2024-02-14 DIAGNOSIS — S82192D Other fracture of upper end of left tibia, subsequent encounter for closed fracture with routine healing: Secondary | ICD-10-CM | POA: Diagnosis not present

## 2024-02-14 DIAGNOSIS — R262 Difficulty in walking, not elsewhere classified: Secondary | ICD-10-CM

## 2024-02-14 NOTE — Therapy (Signed)
 OUTPATIENT PHYSICAL THERAPY LOWER EXTREMITY TREATMENT   Patient Name: Cassandra Adams MRN: [id number] DOB:Feb 24, 1946, 78 y.o., female Today's Date: 02/14/2024  END OF SESSION:  PT End of Session - 02/14/24 1347     Visit Number 2    Number of Visits 6    Date for PT Re-Evaluation 03/21/24    Authorization Type [employer]    PT Start Time 1347    PT Stop Time 1434    PT Time Calculation (min) 47 min    Activity Tolerance Patient tolerated treatment well    Behavior During Therapy [facility] for tasks assessed/performed          Past Medical History:  Diagnosis Date   (HFpEF) heart failure with preserved ejection fraction (HCC)    Limited Echo 7/22: Mild asymmetrical LVH due to scar and thinning of basal posterior wall and background conc LVH, apical and mid segments hyperdynamic, EF 60-65, basal inf-lat and basal inf AK, normal RVSF, RVSP 26.6, mild LAE, mild-moderate MR, mild AV sclerosis w/o AS // Echo 5/22: EF 55-60, RVSP 27, mild to mod LAE, poss small to mod eff; AV sclerosis no AS   Arthritis    all over   CAD (coronary artery disease)    a. NSTEMI 12/2013 - occluded dominant RCA with L-R collaterals, moderate prox segmental LAD disease, for medical therapy initially, EF 60% with subtle inferobasal hypokinesia. Consider PCI for refractory CP.   CKD (chronic kidney disease), stage III (HCC)    Hypertension    Migraines    weekly (01/06/2014)   Sickle cell trait (HCC)    TIA (transient ischemic attack) 1978   Type II diabetes mellitus (HCC)    Vertigo    Past Surgical History:  Procedure Laterality Date   APPENDECTOMY  08/24/2012   had appendix frozen   CARDIAC CATHETERIZATION  years ago & 01/05/2014   CARDIOVERSION N/A 03/17/2014   Procedure: CARDIOVERSION;  Surgeon: Vinie KYM Maxcy, MD;  Location: [facility] ENDOSCOPY;  Service: Cardiovascular;  Laterality: N/A;   CARDIOVERSION N/A 01/14/2021   Procedure: CARDIOVERSION;  Surgeon: Maxcy Vinie BROCKS, MD;  Location: [facility]  ENDOSCOPY;  Service: Cardiovascular;  Laterality: N/A;   CARDIOVERSION N/A 12/13/2021   Procedure: CARDIOVERSION;  Surgeon: Alveta Aleene PARAS, MD;  Location: [facility] ENDOSCOPY;  Service: Cardiovascular;  Laterality: N/A;   CATARACT EXTRACTION W/ INTRAOCULAR LENS  IMPLANT, BILATERAL Bilateral 04/2013-05/2013   CORONARY ATHERECTOMY N/A 08/05/2021   Procedure: CORONARY ATHERECTOMY;  Surgeon: Wonda Sharper, MD;  Location: [facility] INVASIVE CV LAB;  Service: Cardiovascular;  Laterality: N/A;   CORONARY PRESSURE/FFR STUDY N/A 08/04/2021   Procedure: INTRAVASCULAR PRESSURE WIRE/FFR STUDY;  Surgeon: Mady Bruckner, MD;  Location: [facility] INVASIVE CV LAB;  Service: Cardiovascular;  Laterality: N/A;   CORONARY STENT INTERVENTION N/A 08/05/2021   Procedure: CORONARY STENT INTERVENTION;  Surgeon: Wonda Sharper, MD;  Location: [facility] INVASIVE CV LAB;  Service: Cardiovascular;  Laterality: N/A;   ENDARTERECTOMY Right 02/05/2017   Procedure: ENDARTERECTOMY CAROTID-RIGHT;  Surgeon: Harvey Carlin BRAVO, MD;  Location: [facility] OR;  Service: Vascular;  Laterality: Right;   ESOPHAGOGASTRODUODENOSCOPY  11/08/2007    Normal esophagus without evidence of Barrett, mass, erosion/ Normal stomach, duodenal bulb   ESOPHAGOGASTRODUODENOSCOPY (EGD) WITH PROPOFOL  N/A 11/08/2020   Procedure: ESOPHAGOGASTRODUODENOSCOPY (EGD) WITH PROPOFOL ;  Surgeon: Eda Iha, MD;  Location: [facility] ENDOSCOPY;  Service: Gastroenterology;  Laterality: N/A;   GASTRIC MOTILITY STUDY  11/13/2007   mildly delayed emptying subjectively, but normal  analysis-77% of tracer emptied at 2 hours   JOINT  REPLACEMENT     LAPAROSCOPIC APPENDECTOMY N/A 09/15/2012   Procedure: APPENDECTOMY LAPAROSCOPIC;  Surgeon: Oneil DELENA Budge, MD;  Location: [facility] ORS;  Service: General;  Laterality: N/A;   LEFT HEART CATH AND CORONARY ANGIOGRAPHY N/A 08/04/2021   Procedure: LEFT HEART CATH AND CORONARY ANGIOGRAPHY;  Surgeon: Mady Bruckner, MD;  Location: [facility] INVASIVE CV LAB;  Service:  Cardiovascular;  Laterality: N/A;   LEFT HEART CATHETERIZATION WITH CORONARY ANGIOGRAM N/A 01/05/2014   Procedure: LEFT HEART CATHETERIZATION WITH CORONARY ANGIOGRAM;  Surgeon: Dorn JINNY Lesches, MD;  Location: [facility] CATH LAB;  Service: Cardiovascular;  Laterality: N/A;   REPLACEMENT TOTAL KNEE Right 05/03/2006   TEE WITHOUT CARDIOVERSION N/A 03/17/2014   Procedure: TRANSESOPHAGEAL ECHOCARDIOGRAM (TEE);  Surgeon: Vinie KYM Maxcy, MD;  Location: [facility] ENDOSCOPY;  Service: Cardiovascular;  Laterality: N/A;   TONSILLECTOMY  06/26/1970   Patient Active Problem List   Diagnosis Date Noted   Pain in left hip 12/11/2023   Hypertension with congestive heart failure and renal failure (HCC) 10/16/2023   Aortic atherosclerosis (HCC) 10/16/2023   Hyperlipidemia associated with type 2 diabetes mellitus (HCC) 10/16/2023   Stage 3b chronic kidney disease due to type 2 diabetes mellitus (HCC) 10/16/2023   Chronic constipation 10/16/2023   Closed fracture of neck of fibula 10/12/2023   Closed fracture of left proximal tibia 10/11/2023   Acute metabolic encephalopathy 09/28/2023   Chronic kidney disease, stage 3b (HCC) 09/28/2023   Uncontrolled type 2 diabetes mellitus with hyperglycemia, with long-term current use of insulin  (HCC) 09/28/2023   Hypertensive emergency 09/17/2023   Troponin I above reference range 09/17/2023   Leukopenia 09/17/2023   Chronic systolic CHF (congestive heart failure) (HCC) 09/17/2023   Hypertensive heart and chronic kidney disease with heart failure and stage 1 through stage 4 chronic kidney disease, or unspecified chronic kidney disease (HCC) 06/27/2023   Migraine, unspecified, not intractable, without status migrainosus 06/27/2023   Polyosteoarthritis, unspecified 06/27/2023   Prsnl hx of TIA (TIA), and cereb infrc w/o resid deficits 06/27/2023   Sickle cell trait (HCC) 06/27/2023   Type 2 diabetes mellitus with diabetic chronic kidney disease (HCC) 06/27/2023   Nausea &  vomiting 04/11/2022   Severe nonproliferative diabetic retinopathy of right eye without macular edema associated with type 2 diabetes mellitus (HCC) 02/06/2022   Severe nonproliferative diabetic retinopathy of left eye, with macular edema, associated with type 2 diabetes mellitus (HCC) 02/06/2022   Mixed hyperlipidemia 09/15/2021   Chronic heart failure with preserved ejection fraction (HFpEF) (HCC) 09/14/2021   Secondary hypercoagulable state (HCC) 01/25/2021   Microcytic anemia 01/16/2021   Persistent atrial fibrillation (HCC)    Gastric nodule    Acute blood loss anemia 11/07/2020   Hematemesis with nausea 11/07/2020   Elevated troponin 11/07/2020   Syncope and collapse 11/05/2020   CAD (coronary artery disease)    Pain in left knee 07/17/2019   Benign paroxysmal positional vertigo 05/11/2019   Chronic headaches 05/11/2019   Right hemiparesis (HCC) 05/05/2019   Stage 3b chronic kidney disease (CKD) (HCC) 05/05/2019   Left-sided weakness 05/04/2019   Type 2 diabetes mellitus with stage 3 chronic kidney disease, with long-term current use of insulin  (HCC) 06/11/2017   Acute kidney injury superimposed on stage 4 chronic kidney disease (HCC) 01/19/2017   Left leg weakness 01/19/2017   Acute nontraumatic kidney injury (HCC) 01/19/2017   Dyslipidemia 11/24/2016   Hypoglycemia 02/18/2016   Bradycardia 04/22/2015   Coronary artery disease due to lipid rich plaque 02/26/2014   Occlusion and stenosis of  carotid artery without mention of cerebral infarction 11/21/2013   Pain in limb-Left neck 11/21/2013   Carotid stenosis, bilateral 11/17/2011   Subclinical hyperthyroidism 02/15/2010   GERD 02/15/2010   Dysphagia 02/15/2010   Essential hypertension 02/11/2010    PCP: Marvine Rush MD  REFERRING PROVIDER: Margrette Taft BRAVO, MD  REFERRING DIAG: [phone] (ICD-10-CM) - Other closed fracture of proximal end of left tibia with routine healing, subsequent encounter  THERAPY DIAG:   Closed fracture of proximal end of left tibia with routine healing, unspecified fracture morphology, subsequent encounter  Difficulty in walking, not elsewhere classified  Rationale for Evaluation and Treatment: Rehabilitation  ONSET DATE: 10/11/23  SUBJECTIVE:   SUBJECTIVE STATEMENT: 02/14/24:  Pt arrived wearing compression garments, stated the went to [employer] and got 2 pair.  Eval:  Was at [facility]; legs felt weak.  Fell backwards and broke her leg; was using a walker at baseline.  Able to get rid of brace per Dr. Margrette at last visit and arrives with RW.  Leg is still weak and swollen and hurts some.  Went from [facility] to [facility] and then to her daughters home; Was living alone but now staying with her daughter as she recovers.  PERTINENT HISTORY: HTN CHF Afib Sickle cell CAD CKD L knee OA PAIN:  Are you having pain? Yes: NPRS scale: 0-8/10 Pain location: back of the knee Pain description: tight and stiff Aggravating factors: bend too much Relieving factors: moving  PRECAUTIONS: Fall    WEIGHT BEARING RESTRICTIONS: No  FALLS:  Has patient fallen in last 6 months? Yes. Number of falls 1   OCCUPATION: retired  PLOF: Independent with household mobility with device  PATIENT GOALS: walk better  NEXT MD VISIT: 2 months sees Dr. Margrette  OBJECTIVE:  Note: Objective measures were completed at Evaluation unless otherwise noted.  DIAGNOSTIC FINDINGS: Fracture care follow-up 2 views left knee   Injury date 10/11/2023   X-ray shows a proximal metaphyseal diaphyseal fracture of the left tibia   There is excellent sclerotic bone around the fracture site slight medial collapse   Slight apex anterior angulation on the lateral view.  Fracture is healing.  Minimal angulation no significant displacement   Impression healing fracture proximal metaphyseal diaphyseal area left tibial shaft    PATIENT SURVEYS:  LEFS  Extreme difficulty/unable (0), Quite a bit of  difficulty (1), Moderate difficulty (2), Little difficulty (3), No difficulty (4) Survey date:    Any of your usual work, housework or school activities   2. Usual hobbies, recreational or sporting activities   3. Getting into/out of the bath   4. Walking between rooms   5. Putting on socks/shoes   6. Squatting    7. Lifting an object, like a bag of groceries from the floor   8. Performing light activities around your home   9. Performing heavy activities around your home   10. Getting into/out of a car   11. Walking 2 blocks   12. Walking 1 mile   13. Going up/down 10 stairs (1 flight)   14. Standing for 1 hour   15.  sitting for 1 hour   16. Running on even ground   17. Running on uneven ground   18. Making sharp turns while running fast   19. Hopping    20. Rolling over in bed   Score total:  38/80 47.5%     COGNITION: Overall cognitive status: Within functional limits for tasks assessed     SENSATION:  WFL  EDEMA:  Mild left knee and upper leg   PALPATION: Swelling from left knee down general soreness left knee more in posterior joint line area  LOWER EXTREMITY ROM:  Active ROM Right eval Left eval  Hip flexion    Hip extension    Hip abduction    Hip adduction    Hip internal rotation    Hip external rotation    Knee flexion  105  Knee extension  -10  Ankle dorsiflexion    Ankle plantarflexion    Ankle inversion    Ankle eversion     (Blank rows = not tested)  LOWER EXTREMITY MMT:  MMT Right eval Left eval  Hip flexion 4+ 4-  Hip extension    Hip abduction    Hip adduction    Hip internal rotation    Hip external rotation    Knee flexion    Knee extension 5 3-  Ankle dorsiflexion 5 4+  Ankle plantarflexion    Ankle inversion    Ankle eversion     (Blank rows = not tested)  FUNCTIONAL TESTS:  5 times sit to stand: 39.94 sec using hands to assist; left foot forward 2 minute walk test: 72 ft with RW  GAIT: Distance walked: 72  ft Assistive device utilized: Environmental consultant - 2 wheeled Level of assistance: SBA Comments: noted extension lag left knee                                                                                                                                TREATMENT DATE:  02/14/24: Reviewed goals Educated importance of HEP Seated:   STS with foam under, cueing for mechanics, relies on Rt LE weight bearing  LAQ Supine:  Bridge 10x   SAQ 10x 5 Standing:   Heel raises incline slope  Toe raises decline slope  TKE 10x 5 Nustep seat 10 UE/LE x5'  02/05/24 physical therapy evaluation and HEP instruction    PATIENT EDUCATION:  Education details: Patient educated on exam findings, POC, scope of PT, HEP, and what to expect next visit. Person educated: Patient Education method: Explanation, Demonstration, and Handouts Education comprehension: verbalized understanding, returned demonstration, verbal cues required, and tactile cues required  HOME EXERCISE PROGRAM: Access Code: [email or web address] URL: [email or web address] Date: 02/05/2024 Prepared by: [facility] - Rehab  Exercises - Supine Quad Set  - 2 x daily - 7 x weekly - 1 sets - 10 reps - Supine Knee Extension Stretch on Towel Roll  - 2 x daily - 7 x weekly - 1 sets - 1 reps - 3 min hold - Supine Heel Slide  - 1 x daily - 7 x weekly - 1 sets - 10 reps - Sit to Stand with Armchair  - 1 x daily - 7 x weekly - 1 sets - 10 reps  02/14/24. - Supine Bridge  - 2 x daily - 7 x weekly - 1 sets - 10 reps -  5 hold - Supine Knee Extension Strengthening  - 2 x daily - 7 x weekly - 1 sets - 10 reps - 5 hold - Seated Long Arc Quad  - 2 x daily - 7 x weekly - 1 sets - 10 reps - 5 hold  ASSESSMENT:  CLINICAL IMPRESSION: 02/14/24:  Reviewed goals and educated importance of HEP compliance for maximal benefits.  Pt wearing compression garments to session today.  Reviewed proper wear time, instructed proper cleaning and need to replace every 6 months with  verbalized understanding.  Pt required elevated height and cueing for mechanics to complete STS without HHA, cueing to equalize weight bearing as tendency to stand with Rt LE.  Session focus on proximal strengthening and knee mobility.  Added bridges, SAQ and LAQ to HEP with printout given and verbalized understanding.  Cueing to increased Rt LE stride length to improve gait mechanics, good heel to toe mechanics noted.  EOS on Nustep for knee/hip mobility and activity tolerance, goal SPM 65 or higher.  No reports of increased pain through session, was limited by fatigue with a few seated rest breaks.  Eval:  Patient is a 78 y.o. female who was seen today for physical therapy evaluation and treatment for S82.192D (ICD-10-CM) - Other closed fracture of proximal end of left tibia with routine healing, subsequent encounterS82.192D (ICD-10-CM) - Other closed fracture of proximal end of left tibia with routine healing, subsequent encounter. Patient demonstrates muscle weakness, reduced ROM, and fascial restrictions which are likely contributing to symptoms of pain and are negatively impacting patient ability to perform ADLs and functional mobility tasks. Patient will benefit from skilled physical therapy services to address these deficits to reduce pain and improve level of function with ADLs and functional mobility tasks.   OBJECTIVE IMPAIRMENTS: Abnormal gait, decreased activity tolerance, difficulty walking, decreased ROM, decreased strength, increased edema, increased fascial restrictions, impaired perceived functional ability, and pain.   ACTIVITY LIMITATIONS: carrying, lifting, bending, standing, squatting, sleeping, transfers, bed mobility, and locomotion level  PARTICIPATION LIMITATIONS: meal prep, cleaning, shopping, and community activity  REHAB POTENTIAL: Good  CLINICAL DECISION MAKING: Evolving/moderate complexity  EVALUATION COMPLEXITY: Moderate   GOALS: Goals reviewed with patient?  No  SHORT TERM GOALS: Target date: 02/26/2024 patient will be independent with initial HEP  Baseline: Goal status: INITIAL  2.  Patient will report 30% improvement overall  Baseline:  Goal status: INITIAL  3.  Patient will increase left knee mobility to -5 to 120 to promote normal navigation of steps; step over step pattern  Baseline: see above Goal status: INITIAL  LONG TERM GOALS: Target date: 0926/2025  Patient will be independent in self management strategies to improve quality of life and functional outcomes.  Baseline:  Goal status: INITIAL  2.  Patient will report 50% improvement overall  Baseline:  Goal status: INITIAL  3.  Patient will improve 5 times sit to stand score to 25 sec or less to demonstrate improved functional mobility and increased leg strength.    Baseline: 39.94 sec  Goal status: INITIAL  4.   Patient will increase left leg MMT's to 4+ to 5/5 to allow navigation of steps without gait deviation or loss of balance  Baseline: see above Goal status: INITIAL  5.  Patient will increase distance on 2 MWT to 150 ft or more with RW to demonstrate improved speed and efficiency with household and community ambulation Baseline: 72 ft Goal status: INITIAL  PLAN:  PT FREQUENCY: 1x/week  PT DURATION: 6 weeks  PLANNED INTERVENTIONS: 97164- PT Re-evaluation, 97110-Therapeutic exercises, 97530- Therapeutic activity, [id number]- Neuromuscular re-education, 97535- Self Care, [id number]- Manual therapy, [phone]- Gait training, [phone]- Orthotic Fit/training, [phone]- Canalith repositioning, [id number]- Aquatic Therapy, [phone]- Splinting, [phone]- Wound care (first 20 sq cm), 97598- Wound care (each additional 20 sq cm)Patient/Family education, Balance training, Stair training, Taping, Dry Needling, Joint mobilization, Joint manipulation, Spinal manipulation, Spinal mobilization, Scar mobilization, and DME instructions.    PLAN FOR NEXT SESSION: asked for 1 x a week as she relies on  others for transportation; left leg/knee mobility and strength; gait and balance training.  Uses walker at baseline.  Next session begin rocker board, sidestep and progress knee mobility.  Add HEP each session with frequency 1x/week.  Augustin Mclean, LPTA/CLT; WILLAIM [phone]

## 2024-02-20 ENCOUNTER — Ambulatory Visit (HOSPITAL_COMMUNITY): Admitting: Physical Therapy

## 2024-02-20 DIAGNOSIS — R262 Difficulty in walking, not elsewhere classified: Secondary | ICD-10-CM

## 2024-02-20 DIAGNOSIS — S82102D Unspecified fracture of upper end of left tibia, subsequent encounter for closed fracture with routine healing: Secondary | ICD-10-CM

## 2024-02-20 DIAGNOSIS — S82192D Other fracture of upper end of left tibia, subsequent encounter for closed fracture with routine healing: Secondary | ICD-10-CM | POA: Diagnosis not present

## 2024-02-20 NOTE — Therapy (Signed)
 OUTPATIENT PHYSICAL THERAPY LOWER EXTREMITY TREATMENT   Patient Name: Cassandra Adams MRN: [id number] DOB:1945/12/22, 78 y.o., female Today's Date: 02/20/2024  END OF SESSION:    Past Medical History:  Diagnosis Date   (HFpEF) heart failure with preserved ejection fraction (HCC)    Limited Echo 7/22: Mild asymmetrical LVH due to scar and thinning of basal posterior wall and background conc LVH, apical and mid segments hyperdynamic, EF 60-65, basal inf-lat and basal inf AK, normal RVSF, RVSP 26.6, mild LAE, mild-moderate MR, mild AV sclerosis w/o AS // Echo 5/22: EF 55-60, RVSP 27, mild to mod LAE, poss small to mod eff; AV sclerosis no AS   Arthritis    all over   CAD (coronary artery disease)    a. NSTEMI 12/2013 - occluded dominant RCA with L-R collaterals, moderate prox segmental LAD disease, for medical therapy initially, EF 60% with subtle inferobasal hypokinesia. Consider PCI for refractory CP.   CKD (chronic kidney disease), stage III (HCC)    Hypertension    Migraines    weekly (01/06/2014)   Sickle cell trait (HCC)    TIA (transient ischemic attack) 1978   Type II diabetes mellitus (HCC)    Vertigo    Past Surgical History:  Procedure Laterality Date   APPENDECTOMY  08/24/2012   had appendix frozen   CARDIAC CATHETERIZATION  years ago & 01/05/2014   CARDIOVERSION N/A 03/17/2014   Procedure: CARDIOVERSION;  Surgeon: Vinie KYM Maxcy, MD;  Location: [facility] ENDOSCOPY;  Service: Cardiovascular;  Laterality: N/A;   CARDIOVERSION N/A 01/14/2021   Procedure: CARDIOVERSION;  Surgeon: Maxcy Vinie BROCKS, MD;  Location: [facility] ENDOSCOPY;  Service: Cardiovascular;  Laterality: N/A;   CARDIOVERSION N/A 12/13/2021   Procedure: CARDIOVERSION;  Surgeon: Alveta Aleene PARAS, MD;  Location: [facility] ENDOSCOPY;  Service: Cardiovascular;  Laterality: N/A;   CATARACT EXTRACTION W/ INTRAOCULAR LENS  IMPLANT, BILATERAL Bilateral 04/2013-05/2013   CORONARY ATHERECTOMY N/A 08/05/2021   Procedure:  CORONARY ATHERECTOMY;  Surgeon: Wonda Sharper, MD;  Location: [facility] INVASIVE CV LAB;  Service: Cardiovascular;  Laterality: N/A;   CORONARY PRESSURE/FFR STUDY N/A 08/04/2021   Procedure: INTRAVASCULAR PRESSURE WIRE/FFR STUDY;  Surgeon: Mady Bruckner, MD;  Location: [facility] INVASIVE CV LAB;  Service: Cardiovascular;  Laterality: N/A;   CORONARY STENT INTERVENTION N/A 08/05/2021   Procedure: CORONARY STENT INTERVENTION;  Surgeon: Wonda Sharper, MD;  Location: [facility] INVASIVE CV LAB;  Service: Cardiovascular;  Laterality: N/A;   ENDARTERECTOMY Right 02/05/2017   Procedure: ENDARTERECTOMY CAROTID-RIGHT;  Surgeon: Harvey Carlin BRAVO, MD;  Location: [facility] OR;  Service: Vascular;  Laterality: Right;   ESOPHAGOGASTRODUODENOSCOPY  11/08/2007    Normal esophagus without evidence of Barrett, mass, erosion/ Normal stomach, duodenal bulb   ESOPHAGOGASTRODUODENOSCOPY (EGD) WITH PROPOFOL  N/A 11/08/2020   Procedure: ESOPHAGOGASTRODUODENOSCOPY (EGD) WITH PROPOFOL ;  Surgeon: Eda Iha, MD;  Location: [facility] ENDOSCOPY;  Service: Gastroenterology;  Laterality: N/A;   GASTRIC MOTILITY STUDY  11/13/2007   mildly delayed emptying subjectively, but normal  analysis-77% of tracer emptied at 2 hours   JOINT REPLACEMENT     LAPAROSCOPIC APPENDECTOMY N/A 09/15/2012   Procedure: APPENDECTOMY LAPAROSCOPIC;  Surgeon: Oneil DELENA Budge, MD;  Location: [facility] ORS;  Service: General;  Laterality: N/A;   LEFT HEART CATH AND CORONARY ANGIOGRAPHY N/A 08/04/2021   Procedure: LEFT HEART CATH AND CORONARY ANGIOGRAPHY;  Surgeon: Mady Bruckner, MD;  Location: [facility] INVASIVE CV LAB;  Service: Cardiovascular;  Laterality: N/A;   LEFT HEART CATHETERIZATION WITH CORONARY ANGIOGRAM N/A 01/05/2014   Procedure: LEFT HEART CATHETERIZATION WITH CORONARY ANGIOGRAM;  Surgeon: Dorn JINNY Lesches, MD;  Location: [facility] CATH LAB;  Service: Cardiovascular;  Laterality: N/A;   REPLACEMENT TOTAL KNEE Right 05/03/2006   TEE WITHOUT CARDIOVERSION N/A 03/17/2014    Procedure: TRANSESOPHAGEAL ECHOCARDIOGRAM (TEE);  Surgeon: Vinie KYM Maxcy, MD;  Location: [facility] ENDOSCOPY;  Service: Cardiovascular;  Laterality: N/A;   TONSILLECTOMY  06/26/1970   Patient Active Problem List   Diagnosis Date Noted   Pain in left hip 12/11/2023   Hypertension with congestive heart failure and renal failure (HCC) 10/16/2023   Aortic atherosclerosis (HCC) 10/16/2023   Hyperlipidemia associated with type 2 diabetes mellitus (HCC) 10/16/2023   Stage 3b chronic kidney disease due to type 2 diabetes mellitus (HCC) 10/16/2023   Chronic constipation 10/16/2023   Closed fracture of neck of fibula 10/12/2023   Closed fracture of left proximal tibia 10/11/2023   Acute metabolic encephalopathy 09/28/2023   Chronic kidney disease, stage 3b (HCC) 09/28/2023   Uncontrolled type 2 diabetes mellitus with hyperglycemia, with long-term current use of insulin  (HCC) 09/28/2023   Hypertensive emergency 09/17/2023   Troponin I above reference range 09/17/2023   Leukopenia 09/17/2023   Chronic systolic CHF (congestive heart failure) (HCC) 09/17/2023   Hypertensive heart and chronic kidney disease with heart failure and stage 1 through stage 4 chronic kidney disease, or unspecified chronic kidney disease (HCC) 06/27/2023   Migraine, unspecified, not intractable, without status migrainosus 06/27/2023   Polyosteoarthritis, unspecified 06/27/2023   Prsnl hx of TIA (TIA), and cereb infrc w/o resid deficits 06/27/2023   Sickle cell trait (HCC) 06/27/2023   Type 2 diabetes mellitus with diabetic chronic kidney disease (HCC) 06/27/2023   Nausea & vomiting 04/11/2022   Severe nonproliferative diabetic retinopathy of right eye without macular edema associated with type 2 diabetes mellitus (HCC) 02/06/2022   Severe nonproliferative diabetic retinopathy of left eye, with macular edema, associated with type 2 diabetes mellitus (HCC) 02/06/2022   Mixed hyperlipidemia 09/15/2021   Chronic heart failure with  preserved ejection fraction (HFpEF) (HCC) 09/14/2021   Secondary hypercoagulable state (HCC) 01/25/2021   Microcytic anemia 01/16/2021   Persistent atrial fibrillation (HCC)    Gastric nodule    Acute blood loss anemia 11/07/2020   Hematemesis with nausea 11/07/2020   Elevated troponin 11/07/2020   Syncope and collapse 11/05/2020   CAD (coronary artery disease)    Pain in left knee 07/17/2019   Benign paroxysmal positional vertigo 05/11/2019   Chronic headaches 05/11/2019   Right hemiparesis (HCC) 05/05/2019   Stage 3b chronic kidney disease (CKD) (HCC) 05/05/2019   Left-sided weakness 05/04/2019   Type 2 diabetes mellitus with stage 3 chronic kidney disease, with long-term current use of insulin  (HCC) 06/11/2017   Acute kidney injury superimposed on stage 4 chronic kidney disease (HCC) 01/19/2017   Left leg weakness 01/19/2017   Acute nontraumatic kidney injury (HCC) 01/19/2017   Dyslipidemia 11/24/2016   Hypoglycemia 02/18/2016   Bradycardia 04/22/2015   Coronary artery disease due to lipid rich plaque 02/26/2014   Occlusion and stenosis of carotid artery without mention of cerebral infarction 11/21/2013   Pain in limb-Left neck 11/21/2013   Carotid stenosis, bilateral 11/17/2011   Subclinical hyperthyroidism 02/15/2010   GERD 02/15/2010   Dysphagia 02/15/2010   Essential hypertension 02/11/2010    PCP: Marvine Rush MD  REFERRING PROVIDER: Margrette Taft BRAVO, MD  REFERRING DIAG: [phone] (ICD-10-CM) - Other closed fracture of proximal end of left tibia with routine healing, subsequent encounter  THERAPY DIAG:  No diagnosis found.  Rationale for Evaluation and Treatment:  Rehabilitation  ONSET DATE: 10/11/23  SUBJECTIVE:   SUBJECTIVE STATEMENT: 02/14/24:  Pt arrived wearing compression garments, stated the went to [employer] and got 2 pair.  Eval:  Was at [facility]; legs felt weak.  Fell backwards and broke her leg; was using a walker at baseline.  Able to get rid of  brace per Dr. Margrette at last visit and arrives with RW.  Leg is still weak and swollen and hurts some.  Went from [facility] to [facility] and then to her daughters home; Was living alone but now staying with her daughter as she recovers.  PERTINENT HISTORY: HTN CHF Afib Sickle cell CAD CKD L knee OA PAIN:  Are you having pain? Yes: NPRS scale: 0-8/10 Pain location: back of the knee Pain description: tight and stiff Aggravating factors: bend too much Relieving factors: moving  PRECAUTIONS: Fall    WEIGHT BEARING RESTRICTIONS: No  FALLS:  Has patient fallen in last 6 months? Yes. Number of falls 1   OCCUPATION: retired  PLOF: Independent with household mobility with device  PATIENT GOALS: walk better  NEXT MD VISIT: 2 months sees Dr. Margrette  OBJECTIVE:  Note: Objective measures were completed at Evaluation unless otherwise noted.  DIAGNOSTIC FINDINGS: Fracture care follow-up 2 views left knee   Injury date 10/11/2023   X-ray shows a proximal metaphyseal diaphyseal fracture of the left tibia   There is excellent sclerotic bone around the fracture site slight medial collapse   Slight apex anterior angulation on the lateral view.  Fracture is healing.  Minimal angulation no significant displacement   Impression healing fracture proximal metaphyseal diaphyseal area left tibial shaft    PATIENT SURVEYS:  LEFS  Extreme difficulty/unable (0), Quite a bit of difficulty (1), Moderate difficulty (2), Little difficulty (3), No difficulty (4) Survey date:    Any of your usual work, housework or school activities   2. Usual hobbies, recreational or sporting activities   3. Getting into/out of the bath   4. Walking between rooms   5. Putting on socks/shoes   6. Squatting    7. Lifting an object, like a bag of groceries from the floor   8. Performing light activities around your home   9. Performing heavy activities around your home   10. Getting into/out of a car   11.  Walking 2 blocks   12. Walking 1 mile   13. Going up/down 10 stairs (1 flight)   14. Standing for 1 hour   15.  sitting for 1 hour   16. Running on even ground   17. Running on uneven ground   18. Making sharp turns while running fast   19. Hopping    20. Rolling over in bed   Score total:  38/80 47.5%     COGNITION: Overall cognitive status: Within functional limits for tasks assessed     SENSATION: WFL  EDEMA:  Mild left knee and upper leg   PALPATION: Swelling from left knee down general soreness left knee more in posterior joint line area  LOWER EXTREMITY ROM:  Active ROM Right eval Left eval Left   Hip flexion     Hip extension     Hip abduction     Hip adduction     Hip internal rotation     Hip external rotation     Knee flexion  105 115  Knee extension  -10 -7  Ankle dorsiflexion     Ankle plantarflexion     Ankle inversion  Ankle eversion      (Blank rows = not tested)  LOWER EXTREMITY MMT:  MMT Right eval Left eval  Hip flexion 4+ 4-  Hip extension    Hip abduction    Hip adduction    Hip internal rotation    Hip external rotation    Knee flexion    Knee extension 5 3-  Ankle dorsiflexion 5 4+  Ankle plantarflexion    Ankle inversion    Ankle eversion     (Blank rows = not tested)  FUNCTIONAL TESTS:  5 times sit to stand: 39.94 sec using hands to assist; left foot forward 2 minute walk test: 72 ft with RW  GAIT: Distance walked: 72 ft Assistive device utilized: Environmental consultant - 2 wheeled Level of assistance: SBA Comments: noted extension lag left knee                                                                                                                                TREATMENT DATE:  02/20/24 Nustep seat 10 UE/LE level 4, 5 minutes Standing:  all with bil UE assist heel raises 10X  Toeraises 10X  Alternating high march holds 10X  Forward lunges onto 4 step 10X each LE Sit to stands no UE assist standard chair  5X Supine: Lt knee heelslides 10X, AROM to 115 degrees  Bridge 2X10 Sitting: Lt LAQ with extension hold 5 10X Walking in // bars Rt UE only 1RT   02/14/24: Reviewed goals Educated importance of HEP Seated:   STS with foam under, cueing for mechanics, relies on Rt LE weight bearing  LAQ Supine:  Bridge 10x   SAQ 10x 5 Standing:   Heel raises incline slope  Toe raises decline slope  TKE 10x 5 Nustep seat 10 UE/LE x5'  02/05/24 physical therapy evaluation and HEP instruction    PATIENT EDUCATION:  Education details: Patient educated on exam findings, POC, scope of PT, HEP, and what to expect next visit. Person educated: Patient Education method: Explanation, Demonstration, and Handouts Education comprehension: verbalized understanding, returned demonstration, verbal cues required, and tactile cues required  HOME EXERCISE PROGRAM: Access Code: [email or web address] URL: [email or web address] Date: 02/05/2024 Prepared by: [facility] - Rehab  Exercises - Supine Quad Set  - 2 x daily - 7 x weekly - 1 sets - 10 reps - Supine Knee Extension Stretch on Towel Roll  - 2 x daily - 7 x weekly - 1 sets - 1 reps - 3 min hold - Supine Heel Slide  - 1 x daily - 7 x weekly - 1 sets - 10 reps - Sit to Stand with Armchair  - 1 x daily - 7 x weekly - 1 sets - 10 reps  02/14/24. - Supine Bridge  - 2 x daily - 7 x weekly - 1 sets - 10 reps - 5 hold - Supine Knee Extension Strengthening  - 2 x daily - 7 x weekly - 1 sets -  10 reps - 5 hold - Seated Long Arc Quad  - 2 x daily - 7 x weekly - 1 sets - 10 reps - 5 hold  ASSESSMENT:  CLINICAL IMPRESSION: 02/20/24: continued with activity progression and LE stabilization.  Focused on LE strengthening with bil UE required to maintain upright posturing and control. ROM tested today in supine with overall improvement -7 to 115 degrees.  Encoruaged to work on quad sets/LAQ at home to get full extension as well as knee flexion stretch.  Reported not  feeling her leg was strong enough to start cane several weeks ago when tried so worked on gait with 1 UE in // bars to improve confidence.  Pt without any pain or issues completing activities today.  Several rest breaks needed due to fatigue but overall improving.    Eval:  Patient is a 78 y.o. female who was seen today for physical therapy evaluation and treatment for S82.192D (ICD-10-CM) - Other closed fracture of proximal end of left tibia with routine healing, subsequent encounterS82.192D (ICD-10-CM) - Other closed fracture of proximal end of left tibia with routine healing, subsequent encounter. Patient demonstrates muscle weakness, reduced ROM, and fascial restrictions which are likely contributing to symptoms of pain and are negatively impacting patient ability to perform ADLs and functional mobility tasks. Patient will benefit from skilled physical therapy services to address these deficits to reduce pain and improve level of function with ADLs and functional mobility tasks.   OBJECTIVE IMPAIRMENTS: Abnormal gait, decreased activity tolerance, difficulty walking, decreased ROM, decreased strength, increased edema, increased fascial restrictions, impaired perceived functional ability, and pain.   ACTIVITY LIMITATIONS: carrying, lifting, bending, standing, squatting, sleeping, transfers, bed mobility, and locomotion level  PARTICIPATION LIMITATIONS: meal prep, cleaning, shopping, and community activity  REHAB POTENTIAL: Good  CLINICAL DECISION MAKING: Evolving/moderate complexity  EVALUATION COMPLEXITY: Moderate   GOALS: Goals reviewed with patient? No  SHORT TERM GOALS: Target date: 02/26/2024 patient will be independent with initial HEP  Baseline: Goal status: INITIAL  2.  Patient will report 30% improvement overall  Baseline:  Goal status: INITIAL  3.  Patient will increase left knee mobility to -5 to 120 to promote normal navigation of steps; step over step  pattern  Baseline: see above Goal status: INITIAL  LONG TERM GOALS: Target date: 0926/2025  Patient will be independent in self management strategies to improve quality of life and functional outcomes.  Baseline:  Goal status: INITIAL  2.  Patient will report 50% improvement overall  Baseline:  Goal status: INITIAL  3.  Patient will improve 5 times sit to stand score to 25 sec or less to demonstrate improved functional mobility and increased leg strength.    Baseline: 39.94 sec  Goal status: INITIAL  4.   Patient will increase left leg MMT's to 4+ to 5/5 to allow navigation of steps without gait deviation or loss of balance  Baseline: see above Goal status: INITIAL  5.  Patient will increase distance on 2 MWT to 150 ft or more with RW to demonstrate improved speed and efficiency with household and community ambulation Baseline: 72 ft Goal status: INITIAL  PLAN:  PT FREQUENCY: 1x/week  PT DURATION: 6 weeks  PLANNED INTERVENTIONS: 97164- PT Re-evaluation, 97110-Therapeutic exercises, 97530- Therapeutic activity, 97112- Neuromuscular re-education, 97535- Self Care, [id number]- Manual therapy, [id number]- Gait training, [phone]- Orthotic Fit/training, [id number]- Canalith repositioning, [id number]- Aquatic Therapy, [id number]- Splinting, [id number]- Wound care (first 20 sq cm), 02401- Wound care (each additional 20 sq cm)Patient/Family education, Balance training,  Stair training, Taping, Dry Needling, Joint mobilization, Joint manipulation, Spinal manipulation, Spinal mobilization, Scar mobilization, and DME instructions.    PLAN FOR NEXT SESSION:  continue with 1 x a week as she relies on others for transportation; left leg/knee mobility and strength; gait and balance training.  Uses walker at baseline.  Next session begin rocker board, sidestep and progress knee mobility.  Add HEP each session with frequency 1x/week. Greig KATHEE Fuse, PTA/CLT [facility] Ph:  [phone]

## 2024-02-27 ENCOUNTER — Ambulatory Visit (HOSPITAL_COMMUNITY): Attending: Orthopedic Surgery | Admitting: Physical Therapy

## 2024-02-27 DIAGNOSIS — S82102D Unspecified fracture of upper end of left tibia, subsequent encounter for closed fracture with routine healing: Secondary | ICD-10-CM | POA: Diagnosis not present

## 2024-02-27 DIAGNOSIS — R262 Difficulty in walking, not elsewhere classified: Secondary | ICD-10-CM | POA: Insufficient documentation

## 2024-02-27 NOTE — Therapy (Signed)
 OUTPATIENT PHYSICAL THERAPY LOWER EXTREMITY TREATMENT   Patient Name: Cassandra Adams MRN: [id number] DOB:Oct 15, 1945, 78 y.o., female Today's Date: 02/27/2024  END OF SESSION:  PT End of Session - 02/27/24 1616     Visit Number 3    Number of Visits 6    Date for PT Re-Evaluation 03/21/24    Authorization Type [employer]    PT Start Time 1515    PT Stop Time 1554    PT Time Calculation (min) 39 min    Activity Tolerance Patient tolerated treatment well    Behavior During Therapy [facility] for tasks assessed/performed           Past Medical History:  Diagnosis Date   (HFpEF) heart failure with preserved ejection fraction (HCC)    Limited Echo 7/22: Mild asymmetrical LVH due to scar and thinning of basal posterior wall and background conc LVH, apical and mid segments hyperdynamic, EF 60-65, basal inf-lat and basal inf AK, normal RVSF, RVSP 26.6, mild LAE, mild-moderate MR, mild AV sclerosis w/o AS // Echo 5/22: EF 55-60, RVSP 27, mild to mod LAE, poss small to mod eff; AV sclerosis no AS   Arthritis    all over   CAD (coronary artery disease)    a. NSTEMI 12/2013 - occluded dominant RCA with L-R collaterals, moderate prox segmental LAD disease, for medical therapy initially, EF 60% with subtle inferobasal hypokinesia. Consider PCI for refractory CP.   CKD (chronic kidney disease), stage III (HCC)    Hypertension    Migraines    weekly (01/06/2014)   Sickle cell trait (HCC)    TIA (transient ischemic attack) 1978   Type II diabetes mellitus (HCC)    Vertigo    Past Surgical History:  Procedure Laterality Date   APPENDECTOMY  08/24/2012   had appendix frozen   CARDIAC CATHETERIZATION  years ago & 01/05/2014   CARDIOVERSION N/A 03/17/2014   Procedure: CARDIOVERSION;  Surgeon: Vinie KYM Maxcy, MD;  Location: [facility] ENDOSCOPY;  Service: Cardiovascular;  Laterality: N/A;   CARDIOVERSION N/A 01/14/2021   Procedure: CARDIOVERSION;  Surgeon: Maxcy Vinie BROCKS, MD;  Location:  [facility] ENDOSCOPY;  Service: Cardiovascular;  Laterality: N/A;   CARDIOVERSION N/A 12/13/2021   Procedure: CARDIOVERSION;  Surgeon: Alveta Aleene PARAS, MD;  Location: [facility] ENDOSCOPY;  Service: Cardiovascular;  Laterality: N/A;   CATARACT EXTRACTION W/ INTRAOCULAR LENS  IMPLANT, BILATERAL Bilateral 04/2013-05/2013   CORONARY ATHERECTOMY N/A 08/05/2021   Procedure: CORONARY ATHERECTOMY;  Surgeon: Wonda Sharper, MD;  Location: [facility] INVASIVE CV LAB;  Service: Cardiovascular;  Laterality: N/A;   CORONARY PRESSURE/FFR STUDY N/A 08/04/2021   Procedure: INTRAVASCULAR PRESSURE WIRE/FFR STUDY;  Surgeon: Mady Bruckner, MD;  Location: [facility] INVASIVE CV LAB;  Service: Cardiovascular;  Laterality: N/A;   CORONARY STENT INTERVENTION N/A 08/05/2021   Procedure: CORONARY STENT INTERVENTION;  Surgeon: Wonda Sharper, MD;  Location: [facility] INVASIVE CV LAB;  Service: Cardiovascular;  Laterality: N/A;   ENDARTERECTOMY Right 02/05/2017   Procedure: ENDARTERECTOMY CAROTID-RIGHT;  Surgeon: Harvey Carlin BRAVO, MD;  Location: [facility] OR;  Service: Vascular;  Laterality: Right;   ESOPHAGOGASTRODUODENOSCOPY  11/08/2007    Normal esophagus without evidence of Barrett, mass, erosion/ Normal stomach, duodenal bulb   ESOPHAGOGASTRODUODENOSCOPY (EGD) WITH PROPOFOL  N/A 11/08/2020   Procedure: ESOPHAGOGASTRODUODENOSCOPY (EGD) WITH PROPOFOL ;  Surgeon: Eda Iha, MD;  Location: [facility] ENDOSCOPY;  Service: Gastroenterology;  Laterality: N/A;   GASTRIC MOTILITY STUDY  11/13/2007   mildly delayed emptying subjectively, but normal  analysis-77% of tracer emptied at 2 hours  JOINT REPLACEMENT     LAPAROSCOPIC APPENDECTOMY N/A 09/15/2012   Procedure: APPENDECTOMY LAPAROSCOPIC;  Surgeon: Oneil DELENA Budge, MD;  Location: [facility] ORS;  Service: General;  Laterality: N/A;   LEFT HEART CATH AND CORONARY ANGIOGRAPHY N/A 08/04/2021   Procedure: LEFT HEART CATH AND CORONARY ANGIOGRAPHY;  Surgeon: Mady Bruckner, MD;  Location: [facility] INVASIVE CV LAB;  Service:  Cardiovascular;  Laterality: N/A;   LEFT HEART CATHETERIZATION WITH CORONARY ANGIOGRAM N/A 01/05/2014   Procedure: LEFT HEART CATHETERIZATION WITH CORONARY ANGIOGRAM;  Surgeon: Dorn JINNY Lesches, MD;  Location: [facility] CATH LAB;  Service: Cardiovascular;  Laterality: N/A;   REPLACEMENT TOTAL KNEE Right 05/03/2006   TEE WITHOUT CARDIOVERSION N/A 03/17/2014   Procedure: TRANSESOPHAGEAL ECHOCARDIOGRAM (TEE);  Surgeon: Vinie KYM Maxcy, MD;  Location: [facility] ENDOSCOPY;  Service: Cardiovascular;  Laterality: N/A;   TONSILLECTOMY  06/26/1970   Patient Active Problem List   Diagnosis Date Noted   Pain in left hip 12/11/2023   Hypertension with congestive heart failure and renal failure (HCC) 10/16/2023   Aortic atherosclerosis (HCC) 10/16/2023   Hyperlipidemia associated with type 2 diabetes mellitus (HCC) 10/16/2023   Stage 3b chronic kidney disease due to type 2 diabetes mellitus (HCC) 10/16/2023   Chronic constipation 10/16/2023   Closed fracture of neck of fibula 10/12/2023   Closed fracture of left proximal tibia 10/11/2023   Acute metabolic encephalopathy 09/28/2023   Chronic kidney disease, stage 3b (HCC) 09/28/2023   Uncontrolled type 2 diabetes mellitus with hyperglycemia, with long-term current use of insulin  (HCC) 09/28/2023   Hypertensive emergency 09/17/2023   Troponin I above reference range 09/17/2023   Leukopenia 09/17/2023   Chronic systolic CHF (congestive heart failure) (HCC) 09/17/2023   Hypertensive heart and chronic kidney disease with heart failure and stage 1 through stage 4 chronic kidney disease, or unspecified chronic kidney disease (HCC) 06/27/2023   Migraine, unspecified, not intractable, without status migrainosus 06/27/2023   Polyosteoarthritis, unspecified 06/27/2023   Prsnl hx of TIA (TIA), and cereb infrc w/o resid deficits 06/27/2023   Sickle cell trait (HCC) 06/27/2023   Type 2 diabetes mellitus with diabetic chronic kidney disease (HCC) 06/27/2023   Nausea &  vomiting 04/11/2022   Severe nonproliferative diabetic retinopathy of right eye without macular edema associated with type 2 diabetes mellitus (HCC) 02/06/2022   Severe nonproliferative diabetic retinopathy of left eye, with macular edema, associated with type 2 diabetes mellitus (HCC) 02/06/2022   Mixed hyperlipidemia 09/15/2021   Chronic heart failure with preserved ejection fraction (HFpEF) (HCC) 09/14/2021   Secondary hypercoagulable state (HCC) 01/25/2021   Microcytic anemia 01/16/2021   Persistent atrial fibrillation (HCC)    Gastric nodule    Acute blood loss anemia 11/07/2020   Hematemesis with nausea 11/07/2020   Elevated troponin 11/07/2020   Syncope and collapse 11/05/2020   CAD (coronary artery disease)    Pain in left knee 07/17/2019   Benign paroxysmal positional vertigo 05/11/2019   Chronic headaches 05/11/2019   Right hemiparesis (HCC) 05/05/2019   Stage 3b chronic kidney disease (CKD) (HCC) 05/05/2019   Left-sided weakness 05/04/2019   Type 2 diabetes mellitus with stage 3 chronic kidney disease, with long-term current use of insulin  (HCC) 06/11/2017   Acute kidney injury superimposed on stage 4 chronic kidney disease (HCC) 01/19/2017   Left leg weakness 01/19/2017   Acute nontraumatic kidney injury (HCC) 01/19/2017   Dyslipidemia 11/24/2016   Hypoglycemia 02/18/2016   Bradycardia 04/22/2015   Coronary artery disease due to lipid rich plaque 02/26/2014   Occlusion and stenosis  of carotid artery without mention of cerebral infarction 11/21/2013   Pain in limb-Left neck 11/21/2013   Carotid stenosis, bilateral 11/17/2011   Subclinical hyperthyroidism 02/15/2010   GERD 02/15/2010   Dysphagia 02/15/2010   Essential hypertension 02/11/2010    PCP: Marvine Rush MD  REFERRING PROVIDER: Margrette Taft BRAVO, MD  REFERRING DIAG: [id number] (ICD-10-CM) - Other closed fracture of proximal end of left tibia with routine healing, subsequent encounter  THERAPY DIAG:   Closed fracture of proximal end of left tibia with routine healing, unspecified fracture morphology, subsequent encounter  Difficulty in walking, not elsewhere classified  Rationale for Evaluation and Treatment: Rehabilitation  ONSET DATE: 10/11/23  SUBJECTIVE:   SUBJECTIVE STATEMENT: 02/27/24:  Pt reports very high pain today and denies any event happening to worsen pain. 8/10.  Eval:  Was at [facility]; legs felt weak.  Fell backwards and broke her leg; was using a walker at baseline.  Able to get rid of brace per Dr. Margrette at last visit and arrives with RW.  Leg is still weak and swollen and hurts some.  Went from [facility] to [facility] and then to her daughters home; Was living alone but now staying with her daughter as she recovers.  PERTINENT HISTORY: HTN CHF Afib Sickle cell CAD CKD L knee OA PAIN:  Are you having pain? Yes: NPRS scale: 0-8/10 Pain location: back of the knee Pain description: tight and stiff Aggravating factors: bend too much Relieving factors: moving  PRECAUTIONS: Fall    WEIGHT BEARING RESTRICTIONS: No  FALLS:  Has patient fallen in last 6 months? Yes. Number of falls 1   OCCUPATION: retired  PLOF: Independent with household mobility with device  PATIENT GOALS: walk better  NEXT MD VISIT: 2 months sees Dr. Margrette  OBJECTIVE:  Note: Objective measures were completed at Evaluation unless otherwise noted.  DIAGNOSTIC FINDINGS: Fracture care follow-up 2 views left knee   Injury date 10/11/2023   X-ray shows a proximal metaphyseal diaphyseal fracture of the left tibia   There is excellent sclerotic bone around the fracture site slight medial collapse   Slight apex anterior angulation on the lateral view.  Fracture is healing.  Minimal angulation no significant displacement   Impression healing fracture proximal metaphyseal diaphyseal area left tibial shaft    PATIENT SURVEYS:  LEFS  Extreme difficulty/unable (0), Quite a bit of  difficulty (1), Moderate difficulty (2), Little difficulty (3), No difficulty (4) Survey date:    Any of your usual work, housework or school activities   2. Usual hobbies, recreational or sporting activities   3. Getting into/out of the bath   4. Walking between rooms   5. Putting on socks/shoes   6. Squatting    7. Lifting an object, like a bag of groceries from the floor   8. Performing light activities around your home   9. Performing heavy activities around your home   10. Getting into/out of a car   11. Walking 2 blocks   12. Walking 1 mile   13. Going up/down 10 stairs (1 flight)   14. Standing for 1 hour   15.  sitting for 1 hour   16. Running on even ground   17. Running on uneven ground   18. Making sharp turns while running fast   19. Hopping    20. Rolling over in bed   Score total:  38/80 47.5%     COGNITION: Overall cognitive status: Within functional limits for tasks assessed  SENSATION: WFL  EDEMA:  Mild left knee and upper leg   PALPATION: Swelling from left knee down general soreness left knee more in posterior joint line area  LOWER EXTREMITY ROM:  Active ROM Right eval Left eval Left  Left 02/27/24  Hip flexion      Hip extension      Hip abduction      Hip adduction      Hip internal rotation      Hip external rotation      Knee flexion  105 115 115  Knee extension  -10 -7 0  Ankle dorsiflexion      Ankle plantarflexion      Ankle inversion      Ankle eversion       (Blank rows = not tested)  LOWER EXTREMITY MMT:  MMT Right eval Left eval  Hip flexion 4+ 4-  Hip extension    Hip abduction    Hip adduction    Hip internal rotation    Hip external rotation    Knee flexion    Knee extension 5 3-  Ankle dorsiflexion 5 4+  Ankle plantarflexion    Ankle inversion    Ankle eversion     (Blank rows = not tested)  FUNCTIONAL TESTS:  5 times sit to stand: 39.94 sec using hands to assist; left foot forward 2 minute walk test:  72 ft with RW  GAIT: Distance walked: 72 ft Assistive device utilized: Environmental consultant - 2 wheeled Level of assistance: SBA Comments: noted extension lag left knee                                                                                                                                TREATMENT DATE:  02/27/24 Nustep seat 10 UE/LE level 4, 5 minutes Supine:  Lt LE STM with elevation  Heelslides 10X  Lt quadsets 10X5  Bridge 2X5  02/20/24 Nustep seat 10 UE/LE level 4, 5 minutes Standing:  all with bil UE assist heel raises 10X  Toeraises 10X  Alternating high march holds 10X  Forward lunges onto 4 step 10X each LE Sit to stands no UE assist standard chair 5X Supine: Lt knee heelslides 10X, AROM to 115 degrees  Bridge 2X10 Sitting: Lt LAQ with extension hold 5 10X Walking in // bars Rt UE only 1RT   02/14/24: Reviewed goals Educated importance of HEP Seated:   STS with foam under, cueing for mechanics, relies on Rt LE weight bearing  LAQ Supine:  Bridge 10x   SAQ 10x 5 Standing:   Heel raises incline slope  Toe raises decline slope  TKE 10x 5 Nustep seat 10 UE/LE x5'  02/05/24 physical therapy evaluation and HEP instruction    PATIENT EDUCATION:  Education details: Patient educated on exam findings, POC, scope of PT, HEP, and what to expect next visit. Person educated: Patient Education method: Explanation, Demonstration, and Handouts Education comprehension: verbalized understanding, returned  demonstration, verbal cues required, and tactile cues required  HOME EXERCISE PROGRAM: Access Code: [email or web address] URL: [email or web address] Date: 02/05/2024 Prepared by: [facility] - Rehab  Exercises - Supine Quad Set  - 2 x daily - 7 x weekly - 1 sets - 10 reps - Supine Knee Extension Stretch on Towel Roll  - 2 x daily - 7 x weekly - 1 sets - 1 reps - 3 min hold - Supine Heel Slide  - 1 x daily - 7 x weekly - 1 sets - 10 reps - Sit to Stand with Armchair  - 1 x  daily - 7 x weekly - 1 sets - 10 reps  02/14/24. - Supine Bridge  - 2 x daily - 7 x weekly - 1 sets - 10 reps - 5 hold - Supine Knee Extension Strengthening  - 2 x daily - 7 x weekly - 1 sets - 10 reps - 5 hold - Seated Long Arc Quad  - 2 x daily - 7 x weekly - 1 sets - 10 reps - 5 hold  ASSESSMENT:  CLINICAL IMPRESSION: 02/27/24: pt reported increased pain today, especially with weight bearing.  Reports concern but doesn't go back to MD until October.  Therapist reviewed xrays taken one month again and assured pt her LE was okay and pt denies any accidents or events happening.  Completed soft tissue massage to Lt lateral knee to help reduce the discomfort with reported reduction in pain following as well as increased ease with mobility.  Extension is now full and flexion is near full ROM, 0-115 degrees. Completed only NWB exercises today per increase in pain.  F/U and resume standing exercises if pain reduced next session.    Eval:  Patient is a 78 y.o. female who was seen today for physical therapy evaluation and treatment for S82.192D (ICD-10-CM) - Other closed fracture of proximal end of left tibia with routine healing, subsequent encounterS82.192D (ICD-10-CM) - Other closed fracture of proximal end of left tibia with routine healing, subsequent encounter. Patient demonstrates muscle weakness, reduced ROM, and fascial restrictions which are likely contributing to symptoms of pain and are negatively impacting patient ability to perform ADLs and functional mobility tasks. Patient will benefit from skilled physical therapy services to address these deficits to reduce pain and improve level of function with ADLs and functional mobility tasks.   OBJECTIVE IMPAIRMENTS: Abnormal gait, decreased activity tolerance, difficulty walking, decreased ROM, decreased strength, increased edema, increased fascial restrictions, impaired perceived functional ability, and pain.   ACTIVITY LIMITATIONS: carrying,  lifting, bending, standing, squatting, sleeping, transfers, bed mobility, and locomotion level  PARTICIPATION LIMITATIONS: meal prep, cleaning, shopping, and community activity  REHAB POTENTIAL: Good  CLINICAL DECISION MAKING: Evolving/moderate complexity  EVALUATION COMPLEXITY: Moderate   GOALS: Goals reviewed with patient? No  SHORT TERM GOALS: Target date: 02/26/2024 patient will be independent with initial HEP  Baseline: Goal status: INITIAL  2.  Patient will report 30% improvement overall  Baseline:  Goal status: INITIAL  3.  Patient will increase left knee mobility to -5 to 120 to promote normal navigation of steps; step over step pattern  Baseline: see above Goal status: INITIAL  LONG TERM GOALS: Target date: 0926/2025  Patient will be independent in self management strategies to improve quality of life and functional outcomes.  Baseline:  Goal status: INITIAL  2.  Patient will report 50% improvement overall  Baseline:  Goal status: INITIAL  3.  Patient will improve 5 times sit to stand score to 25  sec or less to demonstrate improved functional mobility and increased leg strength.    Baseline: 39.94 sec  Goal status: INITIAL  4.   Patient will increase left leg MMT's to 4+ to 5/5 to allow navigation of steps without gait deviation or loss of balance  Baseline: see above Goal status: INITIAL  5.  Patient will increase distance on 2 MWT to 150 ft or more with RW to demonstrate improved speed and efficiency with household and community ambulation Baseline: 72 ft Goal status: INITIAL  PLAN:  PT FREQUENCY: 1x/week  PT DURATION: 6 weeks  PLANNED INTERVENTIONS: 97164- PT Re-evaluation, 97110-Therapeutic exercises, 97530- Therapeutic activity, 97112- Neuromuscular re-education, 97535- Self Care, [id number]- Manual therapy, [id number]- Gait training, [phone]- Orthotic Fit/training, [phone]- Canalith repositioning, [id number]- Aquatic Therapy, 97760- Splinting, 97597- Wound care  (first 20 sq cm), 97598- Wound care (each additional 20 sq cm)Patient/Family education, Balance training, Stair training, Taping, Dry Needling, Joint mobilization, Joint manipulation, Spinal manipulation, Spinal mobilization, Scar mobilization, and DME instructions.    PLAN FOR NEXT SESSION:  continue with 1 x a week as she relies on others for transportation; left leg/knee mobility and strength; gait and balance training.  Uses walker at baseline.  Next session resume standing exercises if pain reduced (I.e begin rocker board, sidestep and progress knee mobility).  Update HEP.   Greig KATHEE Fuse, PTA/CLT [facility] Ph: [phone]

## 2024-03-07 ENCOUNTER — Encounter (HOSPITAL_COMMUNITY)

## 2024-03-07 DIAGNOSIS — S8262XS Displaced fracture of lateral malleolus of left fibula, sequela: Secondary | ICD-10-CM | POA: Diagnosis not present

## 2024-03-07 DIAGNOSIS — M6281 Muscle weakness (generalized): Secondary | ICD-10-CM | POA: Diagnosis not present

## 2024-03-07 DIAGNOSIS — S8262XD Displaced fracture of lateral malleolus of left fibula, subsequent encounter for closed fracture with routine healing: Secondary | ICD-10-CM | POA: Diagnosis not present

## 2024-03-10 ENCOUNTER — Encounter (HOSPITAL_COMMUNITY)

## 2024-03-18 ENCOUNTER — Ambulatory Visit (HOSPITAL_COMMUNITY)

## 2024-03-18 DIAGNOSIS — S82102D Unspecified fracture of upper end of left tibia, subsequent encounter for closed fracture with routine healing: Secondary | ICD-10-CM

## 2024-03-18 DIAGNOSIS — R262 Difficulty in walking, not elsewhere classified: Secondary | ICD-10-CM | POA: Diagnosis not present

## 2024-03-18 NOTE — Therapy (Signed)
 OUTPATIENT PHYSICAL THERAPY LOWER EXTREMITY TREATMENT/Progress note Progress Note Reporting Period 02/05/24 to 03/18/24  See note below for Objective Data and Assessment of Progress/Goals.       Patient Name: AHNNA DUNGAN MRN: [id number] DOB:[birth year], 78 y.o., female Today's Date: 03/18/2024  END OF SESSION:  PT End of Session - 03/18/24 1455     Visit Number 4    Number of Visits 6    Date for Recertification  03/21/24    Authorization Type [employer]    PT Start Time 1455    PT Stop Time 1535    PT Time Calculation (min) 40 min    Activity Tolerance Patient tolerated treatment well    Behavior During Therapy [facility] for tasks assessed/performed           Past Medical History:  Diagnosis Date   (HFpEF) heart failure with preserved ejection fraction (HCC)    Limited Echo 7/22: Mild asymmetrical LVH due to scar and thinning of basal posterior wall and background conc LVH, apical and mid segments hyperdynamic, EF 60-65, basal inf-lat and basal inf AK, normal RVSF, RVSP 26.6, mild LAE, mild-moderate MR, mild AV sclerosis w/o AS // Echo 5/22: EF 55-60, RVSP 27, mild to mod LAE, poss small to mod eff; AV sclerosis no AS   Arthritis    all over   CAD (coronary artery disease)    a. NSTEMI 12/2013 - occluded dominant RCA with L-R collaterals, moderate prox segmental LAD disease, for medical therapy initially, EF 60% with subtle inferobasal hypokinesia. Consider PCI for refractory CP.   CKD (chronic kidney disease), stage III (HCC)    Hypertension    Migraines    weekly (01/06/2014)   Sickle cell trait    TIA (transient ischemic attack) 1978   Type II diabetes mellitus (HCC)    Vertigo    Past Surgical History:  Procedure Laterality Date   APPENDECTOMY  08/24/2012   had appendix frozen   CARDIAC CATHETERIZATION  years ago & 01/05/2014   CARDIOVERSION N/A 03/17/2014   Procedure: CARDIOVERSION;  Surgeon: Vinie KYM Maxcy, MD;  Location: [facility] ENDOSCOPY;  Service:  Cardiovascular;  Laterality: N/A;   CARDIOVERSION N/A 01/14/2021   Procedure: CARDIOVERSION;  Surgeon: Maxcy Vinie BROCKS, MD;  Location: [facility] ENDOSCOPY;  Service: Cardiovascular;  Laterality: N/A;   CARDIOVERSION N/A 12/13/2021   Procedure: CARDIOVERSION;  Surgeon: Alveta Aleene PARAS, MD;  Location: [facility] ENDOSCOPY;  Service: Cardiovascular;  Laterality: N/A;   CATARACT EXTRACTION W/ INTRAOCULAR LENS  IMPLANT, BILATERAL Bilateral 04/2013-05/2013   CORONARY ATHERECTOMY N/A 08/05/2021   Procedure: CORONARY ATHERECTOMY;  Surgeon: Wonda Sharper, MD;  Location: [facility] INVASIVE CV LAB;  Service: Cardiovascular;  Laterality: N/A;   CORONARY PRESSURE/FFR STUDY N/A 08/04/2021   Procedure: INTRAVASCULAR PRESSURE WIRE/FFR STUDY;  Surgeon: Mady Bruckner, MD;  Location: [facility] INVASIVE CV LAB;  Service: Cardiovascular;  Laterality: N/A;   CORONARY STENT INTERVENTION N/A 08/05/2021   Procedure: CORONARY STENT INTERVENTION;  Surgeon: Wonda Sharper, MD;  Location: [facility] INVASIVE CV LAB;  Service: Cardiovascular;  Laterality: N/A;   ENDARTERECTOMY Right 02/05/2017   Procedure: ENDARTERECTOMY CAROTID-RIGHT;  Surgeon: Harvey Carlin BRAVO, MD;  Location: [facility] OR;  Service: Vascular;  Laterality: Right;   ESOPHAGOGASTRODUODENOSCOPY  11/08/2007    Normal esophagus without evidence of Barrett, mass, erosion/ Normal stomach, duodenal bulb   ESOPHAGOGASTRODUODENOSCOPY (EGD) WITH PROPOFOL  N/A 11/08/2020   Procedure: ESOPHAGOGASTRODUODENOSCOPY (EGD) WITH PROPOFOL ;  Surgeon: Eda Iha, MD;  Location: [facility] ENDOSCOPY;  Service: Gastroenterology;  Laterality: N/A;   GASTRIC  MOTILITY STUDY  11/13/2007   mildly delayed emptying subjectively, but normal  analysis-77% of tracer emptied at 2 hours   JOINT REPLACEMENT     LAPAROSCOPIC APPENDECTOMY N/A 09/15/2012   Procedure: APPENDECTOMY LAPAROSCOPIC;  Surgeon: Oneil DELENA Budge, MD;  Location: [facility] ORS;  Service: General;  Laterality: N/A;   LEFT HEART CATH AND CORONARY ANGIOGRAPHY N/A  08/04/2021   Procedure: LEFT HEART CATH AND CORONARY ANGIOGRAPHY;  Surgeon: Mady Bruckner, MD;  Location: [facility] INVASIVE CV LAB;  Service: Cardiovascular;  Laterality: N/A;   LEFT HEART CATHETERIZATION WITH CORONARY ANGIOGRAM N/A 01/05/2014   Procedure: LEFT HEART CATHETERIZATION WITH CORONARY ANGIOGRAM;  Surgeon: Dorn JINNY Lesches, MD;  Location: [facility] CATH LAB;  Service: Cardiovascular;  Laterality: N/A;   REPLACEMENT TOTAL KNEE Right 05/03/2006   TEE WITHOUT CARDIOVERSION N/A 03/17/2014   Procedure: TRANSESOPHAGEAL ECHOCARDIOGRAM (TEE);  Surgeon: Vinie KYM Maxcy, MD;  Location: [facility] ENDOSCOPY;  Service: Cardiovascular;  Laterality: N/A;   TONSILLECTOMY  06/26/1970   Patient Active Problem List   Diagnosis Date Noted   Pain in left hip 12/11/2023   Hypertension with congestive heart failure and renal failure (HCC) 10/16/2023   Aortic atherosclerosis 10/16/2023   Hyperlipidemia associated with type 2 diabetes mellitus (HCC) 10/16/2023   Stage 3b chronic kidney disease due to type 2 diabetes mellitus (HCC) 10/16/2023   Chronic constipation 10/16/2023   Closed fracture of neck of fibula 10/12/2023   Closed fracture of left proximal tibia 10/11/2023   Acute metabolic encephalopathy 09/28/2023   Chronic kidney disease, stage 3b (HCC) 09/28/2023   Uncontrolled type 2 diabetes mellitus with hyperglycemia, with long-term current use of insulin  (HCC) 09/28/2023   Hypertensive emergency 09/17/2023   Troponin I above reference range 09/17/2023   Leukopenia 09/17/2023   Chronic systolic CHF (congestive heart failure) (HCC) 09/17/2023   Hypertensive heart and chronic kidney disease with heart failure and stage 1 through stage 4 chronic kidney disease, or unspecified chronic kidney disease (HCC) 06/27/2023   Migraine, unspecified, not intractable, without status migrainosus 06/27/2023   Polyosteoarthritis, unspecified 06/27/2023   Prsnl hx of TIA (TIA), and cereb infrc w/o resid deficits 06/27/2023    Sickle cell trait 06/27/2023   Type 2 diabetes mellitus with diabetic chronic kidney disease (HCC) 06/27/2023   Nausea & vomiting 04/11/2022   Severe nonproliferative diabetic retinopathy of right eye without macular edema associated with type 2 diabetes mellitus (HCC) 02/06/2022   Severe nonproliferative diabetic retinopathy of left eye, with macular edema, associated with type 2 diabetes mellitus (HCC) 02/06/2022   Mixed hyperlipidemia 09/15/2021   Chronic heart failure with preserved ejection fraction (HFpEF) (HCC) 09/14/2021   Secondary hypercoagulable state 01/25/2021   Microcytic anemia 01/16/2021   Persistent atrial fibrillation (HCC)    Gastric nodule    Acute blood loss anemia 11/07/2020   Hematemesis with nausea 11/07/2020   Elevated troponin 11/07/2020   Syncope and collapse 11/05/2020   CAD (coronary artery disease)    Pain in left knee 07/17/2019   Benign paroxysmal positional vertigo 05/11/2019   Chronic headaches 05/11/2019   Right hemiparesis (HCC) 05/05/2019   Stage 3b chronic kidney disease (CKD) (HCC) 05/05/2019   Left-sided weakness 05/04/2019   Type 2 diabetes mellitus with stage 3 chronic kidney disease, with long-term current use of insulin  (HCC) 06/11/2017   Acute kidney injury superimposed on stage 4 chronic kidney disease (HCC) 01/19/2017   Left leg weakness 01/19/2017   Acute nontraumatic kidney injury 01/19/2017   Dyslipidemia 11/24/2016   Hypoglycemia 02/18/2016  Bradycardia 04/22/2015   Coronary artery disease due to lipid rich plaque 02/26/2014   Occlusion and stenosis of carotid artery without mention of cerebral infarction 11/21/2013   Pain in limb-Left neck 11/21/2013   Carotid stenosis, bilateral 11/17/2011   Subclinical hyperthyroidism 02/15/2010   GERD 02/15/2010   Dysphagia 02/15/2010   Essential hypertension 02/11/2010    PCP: Marvine Rush MD  REFERRING PROVIDER: Margrette Taft BRAVO, MD  REFERRING DIAG: [phone] (ICD-10-CM) - Other  closed fracture of proximal end of left tibia with routine healing, subsequent encounter  THERAPY DIAG:  Closed fracture of proximal end of left tibia with routine healing, unspecified fracture morphology, subsequent encounter  Difficulty in walking, not elsewhere classified  Rationale for Evaluation and Treatment: Rehabilitation  ONSET DATE: 10/11/23  SUBJECTIVE:   SUBJECTIVE STATEMENT: Has not been here for 3 weeks due to increased pain; not sure why her pain increased.    Eval:  Was at [facility]; legs felt weak.  Fell backwards and broke her leg; was using a walker at baseline.  Able to get rid of brace per Dr. Margrette at last visit and arrives with RW.  Leg is still weak and swollen and hurts some.  Went from [facility] [place] to [facility] and then to her daughters home; Was living alone but now staying with her daughter as she recovers.  PERTINENT HISTORY: HTN CHF Afib Sickle cell CAD CKD L knee OA PAIN:  Are you having pain? Yes: NPRS scale: 0-8/10 Pain location: back of the knee Pain description: tight and stiff Aggravating factors: bend too much Relieving factors: moving  PRECAUTIONS: Fall    WEIGHT BEARING RESTRICTIONS: No  FALLS:  Has patient fallen in last 6 months? Yes. Number of falls 1   OCCUPATION: retired  PLOF: Independent with household mobility with device  PATIENT GOALS: walk better  NEXT MD VISIT: 2 months sees Dr. Margrette  OBJECTIVE:  Note: Objective measures were completed at Evaluation unless otherwise noted.  DIAGNOSTIC FINDINGS: Fracture care follow-up 2 views left knee   Injury date 10/11/2023   X-ray shows a proximal metaphyseal diaphyseal fracture of the left tibia   There is excellent sclerotic bone around the fracture site slight medial collapse   Slight apex anterior angulation on the lateral view.  Fracture is healing.  Minimal angulation no significant displacement   Impression healing fracture proximal metaphyseal diaphyseal area  left tibial shaft    PATIENT SURVEYS:  LEFS  Extreme difficulty/unable (0), Quite a bit of difficulty (1), Moderate difficulty (2), Little difficulty (3), No difficulty (4) Survey date:    Any of your usual work, housework or school activities   2. Usual hobbies, recreational or sporting activities   3. Getting into/out of the bath   4. Walking between rooms   5. Putting on socks/shoes   6. Squatting    7. Lifting an object, like a bag of groceries from the floor   8. Performing light activities around your home   9. Performing heavy activities around your home   10. Getting into/out of a car   11. Walking 2 blocks   12. Walking 1 mile   13. Going up/down 10 stairs (1 flight)   14. Standing for 1 hour   15.  sitting for 1 hour   16. Running on even ground   17. Running on uneven ground   18. Making sharp turns while running fast   19. Hopping    20. Rolling over in bed   Score total:  38/80 47.5%     COGNITION: Overall cognitive status: Within functional limits for tasks assessed     SENSATION: WFL  EDEMA:  Mild left knee and upper leg   PALPATION: Swelling from left knee down general soreness left knee more in posterior joint line area  LOWER EXTREMITY ROM:  Active ROM Right eval Left eval Left  Left 02/27/24 Left 03/18/24  Hip flexion       Hip extension       Hip abduction       Hip adduction       Hip internal rotation       Hip external rotation       Knee flexion  105 115 115 107*  Knee extension  -10 -7 0 -5  Ankle dorsiflexion       Ankle plantarflexion       Ankle inversion       Ankle eversion        (Blank rows = not tested)  LOWER EXTREMITY MMT:* pain  MMT Right eval Left eval Left 03/18/24  Hip flexion 4+ 4- 4*  Hip extension     Hip abduction     Hip adduction     Hip internal rotation     Hip external rotation     Knee flexion     Knee extension 5 3- 3-* tight  Ankle dorsiflexion 5 4+ 5  Ankle plantarflexion     Ankle  inversion     Ankle eversion      (Blank rows = not tested)  FUNCTIONAL TESTS:  5 times sit to stand: 39.94 sec using hands to assist; left foot forward 2 minute walk test: 72 ft with RW  GAIT: Distance walked: 72 ft Assistive device utilized: Environmental consultant - 2 wheeled Level of assistance: SBA Comments: noted extension lag left knee                                                                                                                                TREATMENT DATE:  03/18/24 Progress note LEFS 14/80 (17.5%; down from eval 38/80) MMT's and AROM see above Supine  Quad sets 5 hold x 10 Heel slides x 10 Ankle pumps x 10 Hip abduction x 10 Seated LAQ's  Discussion of follow up with MD      02/27/24 Nustep seat 10 UE/LE level 4, 5 minutes Supine:  Lt LE STM with elevation  Heelslides 10X  Lt quadsets 10X5  Bridge 2X5  02/20/24 Nustep seat 10 UE/LE level 4, 5 minutes Standing:  all with bil UE assist heel raises 10X  Toeraises 10X  Alternating high march holds 10X  Forward lunges onto 4 step 10X each LE Sit to stands no UE assist standard chair 5X Supine: Lt knee heelslides 10X, AROM to 115 degrees  Bridge 2X10 Sitting: Lt LAQ with extension hold 5 10X Walking in // bars Rt UE only 1RT   02/14/24: Reviewed  goals Educated importance of HEP Seated:   STS with foam under, cueing for mechanics, relies on Rt LE weight bearing  LAQ Supine:  Bridge 10x   SAQ 10x 5 Standing:   Heel raises incline slope  Toe raises decline slope  TKE 10x 5 Nustep seat 10 UE/LE x5'  02/05/24 physical therapy evaluation and HEP instruction    PATIENT EDUCATION:  Education details: Patient educated on exam findings, POC, scope of PT, HEP, and what to expect next visit. Person educated: Patient Education method: Explanation, Demonstration, and Handouts Education comprehension: verbalized understanding, returned demonstration, verbal cues required, and tactile cues  required  HOME EXERCISE PROGRAM: Access Code: [email or web address] URL: [email or web address] Date: 02/05/2024 Prepared by: [facility] - Rehab  Exercises - Supine Quad Set  - 2 x daily - 7 x weekly - 1 sets - 10 reps - Supine Knee Extension Stretch on Towel Roll  - 2 x daily - 7 x weekly - 1 sets - 1 reps - 3 min hold - Supine Heel Slide  - 1 x daily - 7 x weekly - 1 sets - 10 reps - Sit to Stand with Armchair  - 1 x daily - 7 x weekly - 1 sets - 10 reps  02/14/24. - Supine Bridge  - 2 x daily - 7 x weekly - 1 sets - 10 reps - 5 hold - Supine Knee Extension Strengthening  - 2 x daily - 7 x weekly - 1 sets - 10 reps - 5 hold - Seated Long Arc Quad  - 2 x daily - 7 x weekly - 1 sets - 10 reps - 5 hold  ASSESSMENT:  CLINICAL IMPRESSION: 03/18/24: pt reported continued increased pain today, especially with weight bearing; of note her left leg is swollen and tender to the touch distal tibia. Calf is soft and minimally tender. Pitting edema noted. She takes a blood thinner daily.  Sees MD  October 2nd.  We will hold off further PT until she sees MD again.     Eval:  Patient is a 78 y.o. female who was seen today for physical therapy evaluation and treatment for S82.192D (ICD-10-CM) - Other closed fracture of proximal end of left tibia with routine healing, subsequent encounterS82.192D (ICD-10-CM) - Other closed fracture of proximal end of left tibia with routine healing, subsequent encounter. Patient demonstrates muscle weakness, reduced ROM, and fascial restrictions which are likely contributing to symptoms of pain and are negatively impacting patient ability to perform ADLs and functional mobility tasks. Patient will benefit from skilled physical therapy services to address these deficits to reduce pain and improve level of function with ADLs and functional mobility tasks.   OBJECTIVE IMPAIRMENTS: Abnormal gait, decreased activity tolerance, difficulty walking, decreased ROM, decreased strength,  increased edema, increased fascial restrictions, impaired perceived functional ability, and pain.   ACTIVITY LIMITATIONS: carrying, lifting, bending, standing, squatting, sleeping, transfers, bed mobility, and locomotion level  PARTICIPATION LIMITATIONS: meal prep, cleaning, shopping, and community activity  REHAB POTENTIAL: Good  CLINICAL DECISION MAKING: Evolving/moderate complexity  EVALUATION COMPLEXITY: Moderate   GOALS: Goals reviewed with patient? No  SHORT TERM GOALS: Target date: 02/26/2024 patient will be independent with initial HEP  Baseline: Goal status: INITIAL  2.  Patient will report 30% improvement overall  Baseline:  Goal status: INITIAL  3.  Patient will increase left knee mobility to -5 to 120 to promote normal navigation of steps; step over step pattern  Baseline: see above Goal status: INITIAL  LONG TERM GOALS: Target  date: 0926/2025  Patient will be independent in self management strategies to improve quality of life and functional outcomes.  Baseline:  Goal status: INITIAL  2.  Patient will report 50% improvement overall  Baseline:  Goal status: INITIAL  3.  Patient will improve 5 times sit to stand score to 25 sec or less to demonstrate improved functional mobility and increased leg strength.    Baseline: 39.94 sec  Goal status: INITIAL  4.   Patient will increase left leg MMT's to 4+ to 5/5 to allow navigation of steps without gait deviation or loss of balance  Baseline: see above Goal status: INITIAL  5.  Patient will increase distance on 2 MWT to 150 ft or more with RW to demonstrate improved speed and efficiency with household and community ambulation Baseline: 72 ft Goal status: INITIAL  PLAN:  PT FREQUENCY: 1x/week  PT DURATION: 6 weeks  PLANNED INTERVENTIONS: [id number]- PT Re-evaluation, 97110-Therapeutic exercises, 97530- Therapeutic activity, 97112- Neuromuscular re-education, 97535- Self Care, [id number]- Manual therapy, [id number]-  Gait training, [phone]- Orthotic Fit/training, [phone]- Canalith repositioning, [id number]- Aquatic Therapy, 97760- Splinting, 97597- Wound care (first 20 sq cm), 97598- Wound care (each additional 20 sq cm)Patient/Family education, Balance training, Stair training, Taping, Dry Needling, Joint mobilization, Joint manipulation, Spinal manipulation, Spinal mobilization, Scar mobilization, and DME instructions.    PLAN FOR NEXT SESSION:  hold until sees MD   2:56 PM, 03/18/24 Kule Gascoigne Small [place] Gadbois MPT [facility] [place] [phone]

## 2024-03-24 ENCOUNTER — Other Ambulatory Visit: Payer: Self-pay | Admitting: Internal Medicine

## 2024-03-24 ENCOUNTER — Ambulatory Visit: Attending: Internal Medicine | Admitting: Internal Medicine

## 2024-03-24 ENCOUNTER — Encounter: Payer: Self-pay | Admitting: Internal Medicine

## 2024-03-24 VITALS — BP 184/93 | HR 85 | Ht 66.0 in | Wt 187.0 lb

## 2024-03-24 DIAGNOSIS — I10 Essential (primary) hypertension: Secondary | ICD-10-CM | POA: Diagnosis not present

## 2024-03-24 DIAGNOSIS — I6523 Occlusion and stenosis of bilateral carotid arteries: Secondary | ICD-10-CM

## 2024-03-24 DIAGNOSIS — I2510 Atherosclerotic heart disease of native coronary artery without angina pectoris: Secondary | ICD-10-CM | POA: Diagnosis not present

## 2024-03-24 DIAGNOSIS — I4819 Other persistent atrial fibrillation: Secondary | ICD-10-CM | POA: Diagnosis not present

## 2024-03-24 MED ORDER — CARVEDILOL 6.25 MG PO TABS: 6.2500 mg | ORAL_TABLET | Freq: Two times a day (BID) | ORAL | 3 refills | Status: AC

## 2024-03-24 NOTE — Progress Notes (Signed)
 03/24/2024 Cassandra Adams   08-16-1945  [id number]  Primary Physicia Marvine Rush, MD Primary Cardiologist: Dr. Mona  CC: Routine follow-up  HPI:  The patient is a 78 year old African American female with a history of diabetes, hypertension, prior stroke but no prior history of CAD, who initially presented to [facility] on 01/03/2014 with a complaint of chest pain. Initial troponin was elevated at 15.7 and EKG was with nonspecific ST-T changes. She was placed on IV heparin  and IV nitroglycerin  and was transferred to [facility] for further treatment. Her chest pain resolved. She underwent a diagnostic left heart catheterization, perform by Dr. Court which demonstrated occlusion of the midportion of the RCA, which was felt to be the infarct-related artery. However, she had grade 3 left right collaterals. She also has moderate LAD artery disease with 50% segmental stenosis in the proximal portion with a focal area of 60-70% stenosis. The left main and left circumflex were both normal. Left ventricular systolic function was normal with an estimated ejection fraction of 60%. Wall motion abnormalities were notable for sublte inferior basal hypokinesia. Given her lack of ongoing symptoms and preserved LV function with excellent collaterals, it was recommended to treat with medical therapy. It was felt that if she were to develop recurrent chest pain then she would be a candidate for PCI and stenting of her RCA. She left the Cath Lab in stable condition and had no post-cath complications and no recurrent chest pain. She was placed on dual antiplatelet therapy with aspirin  plus Plavix . She was also placed on a beta blocker, an ACE inhibitor and statin. Also notable this admission, was a hemoglobin A1c level of 8.1. She was instructed to followup with her PCP for better management of her diabetes.  She was discharged home on 01/06/2014.  She presents to clinic today for post hospital  followup. She is accompanied by her daughter. She denies any recurrent anginal symptoms. She also denies dyspnea, syncope/near-syncope. Yesterday however, she did note feeling a sensation of increased warmth and mild dizziness, that was self-limiting. This lasted only a minute or 2. Again, she had no syncope/near-syncope. She denies any further recurrence. She has been fully compliant with her medications.  Cassandra Adams is seen in followup today. Her EKG demonstrates a sinus rhythm today. However I suspect that she continues to have PAF. Unfortunate she took herself off of Xarelto  after experiencing some oral bleeding. She did not notify the office and has not been on any anticoagulation for several months. We discussed her higher than normal risk of stroke, based on a CHADSVASC score of 5. She reports only one episode of angina in January, however it was very short-lived and sharp pain. She took a nitroglycerin  and the symptoms went away after 10 or 15 minutes, suggesting this may not eventually been angina.  I saw Cassandra Adams today back in the office. Overall she is feeling well. She denies any chest pain or shortness of breath. She's not had any further episodes of A. fib. She is noted to be mildly bradycardic today. Blood pressure has run somewhat high for a while.  02/18/2016  Cassandra Adams returns today for follow-up. She reports she's been having some episodes of what sounds like night sweats. She wakes up at night fairly soaked. These don't happen every night and is been no associated weight loss, change in appetite or other constitutional symptoms. She reports she is long past her menopause symptoms. She continues to have morning hypoglycemia  with blood sugars in the 40s. I previously asked to decrease her Lantus  insulin . She says that her endocrinologist is aware of this and she has an appointment with him in September. She is not checking her sugars in the middle the night but I'm suspicious of  hypoglycemia. Other possible causes would include autonomic dysfunction, she does have gastroparesis and peripheral neuropathy. In addition she could be having other symptoms such as reflux or other things which cause vagal stimulation and diaphoresis. This is very atypical for coronary disease and there is no associated chest pain or worsening shortness of breath.  03/30/2016  Cassandra Adams was seen back today for follow-up. She reports her hot flashes have resolved. Unfortunately even though we decreased her insulin , she had some recurrent hypoglycemia and ended up in the emergency department. She is now having problems with hyperglycemia. 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 She denies any chest pain or worsening shortness of breath. She does have some chronic lower extremity edema which is likely from neuropathy. I've recommended bilateral knee-high 20-30 mmHg compression stockings.  11/24/2016  Cassandra Adams returns for follow-up. She reported December she had to take one nitroglycerin . She was very active that day and it seemed to resolve her symptoms fairly quickly. She's not required any more nitroglycerin . She is asking for refills of that today. She continues to have problems with hypoglycemia although does seem she has awareness. Recently she had a few episodes of spontaneous diaphoresis. This was not associated with any chest pain and testing of her blood sugar indicated hypoglycemia so I suspect that this is more hypoglycemia awareness. She reports some lower strandy swelling which we discussed in the past would be amenable to compression stockings. She is on daily Lasix . Blood pressure was initially elevated 158/62 of her came out a 134/60. She has managed to lose 10 pounds since I last saw her which I commended her on as well.  11/26/2017  Cassandra Adams returns today for routine follow-up.  Overall she is doing well.   She has not required any nitroglycerin  since I last saw her.  She denies any bleeding problems on Eliquis .  She is out of some medications including atorvastatin  and nearly out of Eliquis .  She also lost her nitroglycerin  and needs a refill.  She reports reasonably good blood sugar control.  Her blood pressure has remained elevated.  Recently her PCP started her on losartan, however she ready was on lisinopril .  She is not on any additional blood pressure medications.  Blood pressure today was 169/82.  Given her history of coronary disease, she would be a good candidate for beta-blocker.  EKG shows sinus rhythm with PVCs in fact today and nonspecific T wave changes at 80.  07/03/2019  Cassandra Adams returns today for follow-up.  Over the past year she has done well.  She denies any chest pain or worsening shortness of breath.  Blood pressure was very elevated today at 208/98 however she was rushed to get in the office and was upset that her daughter could not come back with her.  I repeated her blood pressure eventually did come down to 132/68.  EKG shows sinus rhythm with some PACs today.  Recent labs from November showed total cholesterol 128, HDL 51, LDL 57 triglycerides 99.  She is working with Dr. Lenis regarding her diabetes and her hemoglobin A1c is still elevated 8.3.  06/30/2021  Cassandra Adams was seen today  for hospital follow-up.  She was admitted at [facility] for bradycardia cardia and confusion.  Her carvedilol  was held.  Troponins were flat minimally elevated.  She was noted to have some PAF and started on Multaq .  Cardiology was consulted.  Cardiogram was performed which showed inferobasal hypokinesis with EF 55%.  There is severe asymmetric hypertrophy of the basal septal segments.  Left atrium was moderately dilated.  She had no chest pain with this.  She was placed on a 2-week ZIO monitor which is due today.  She reports no recurrent symptomatic atrial fibrillation.  She has noted some  swelling and today has a headache.  Weight is 176 pounds up from 165 pounds previously.  I believe her discharge weight was around 171 pounds.  05/25/2022  Cassandra Adams is seen today in follow-up.  This year has been quite eventful for her.  She was hospitalized in February for precordial chest pain and found to have two-vessel coronary disease with chronic total occlusion of the RCA and severe stenosis of the LAD.  She underwent drug-eluting stent placement to the LAD with a good result.  Subsequently she was hospitalized in March with atrial fibrillation and rapid ventricular response.  She was ultimately placed on amiodarone .  She has subsequently maintained sinus bradycardia.  She was then hospitalized in April for hyperglycemia.  She was treated for this and has still had some issues with elevated blood sugar.  She was admitted again in June with atrial fibrillation and again in October with hyperglycemia.  Currently she says she is feeling well.  Her blood sugars are still quite high she says at x 4 or 500.  She is only on 10 units of long-acting insulin  but previously had taken 25 units.  This is being managed by her PCP.  She does have a referral for endocrinology but not until the summer.  She denies any recurrent atrial fibrillation and is in a bradycardic regular rhythm today.  She denies any chest pain.  She is currently on Plavix  and Eliquis  having been on triple therapy for short period of time.  03/24/2024  Cassandra Adams seen today for follow-up.  Fortunately she has not been hospitalized for any chest pain.  She has had some issues with low blood sugar.  She also had a fall and suffered a left leg fracture.  She continues to have pain with this.  Blood pressure was elevated today 184/93.  Some home blood pressure readings were elevated as well.  Last blood pressure reading in August was 138/80.  Heart rate ranges from the 50s up to the 80s.  She is on Eliquis  monotherapy and is in  persistent atrial fibrillation without any symptoms.  She had stopped Plavix  and her prior stent was in 2023.  She denies any chest pain.   Current Outpatient Medications  Medication Sig Dispense Refill   acetaminophen  (TYLENOL ) 650 MG CR tablet Take 650 mg by mouth every 6 (six) hours as needed for pain.     amLODipine  (NORVASC ) 10 MG tablet Take 1 tablet (10 mg total) by mouth daily. 30 tablet 0   atorvastatin  (LIPITOR ) 80 MG tablet Take 1 tablet (80 mg total) by mouth every evening. 30 tablet 0   BD PEN NEEDLE MINI ULTRAFINE 31G X 5 MM MISC USE TO INJECT INSULIN  4 TIMES DAILY. 400 each 3   Blood Glucose Monitoring Suppl (ACCU-CHEK GUIDE) w/Device KIT 1 Device by Does not apply route daily in the afternoon. 1 kit 0  carvedilol  (COREG ) 3.125 MG tablet Take 1 tablet (3.125 mg total) by mouth 2 (two) times daily with a meal. 180 tablet 0   clopidogrel  (PLAVIX ) 75 MG tablet Take 75 mg by mouth daily.     colesevelam  (WELCHOL ) 625 MG tablet TAKE 3 TABLETS BY MOUTH TWICE A DAY OR 6 TABLETS ONCE DAILY WITH MEALS.     ELIQUIS  5 MG TABS tablet Take 1 tablet (5 mg total) by mouth 2 (two) times daily. 60 tablet 0   empagliflozin  (JARDIANCE ) 10 MG TABS tablet Take 1 tablet (10 mg total) by mouth daily. 30 tablet 0   furosemide  (LASIX ) 40 MG tablet Take 1 tablet (40 mg total) by mouth daily. 30 tablet 0   glucose blood (ACCU-CHEK GUIDE TEST) test strip 1 each by Other route daily in the afternoon. Use as instructed 100 each 12   HYDROcodone -acetaminophen  (NORCO/VICODIN) 5-325 MG tablet Take 1 tablet by mouth every 6 (six) hours as needed for moderate pain (pain score 4-6). 30 tablet 0   insulin  aspart (NOVOLOG  FLEXPEN) 100 UNIT/ML FlexPen Inject 6 Units into the skin 3 (three) times daily with meals. 15 mL 0   Insulin  Glargine (BASAGLAR  KWIKPEN) 100 UNIT/ML Inject 22 Units into the skin daily. 15 mL 0   isosorbide  mononitrate (IMDUR ) 60 MG 24 hr tablet Take 1 tablet (60 mg total) by mouth daily. 30 tablet  0   mupirocin  ointment (BACTROBAN ) 2 % Apply 1 Application topically 2 (two) times daily. 22 g 0   nitroGLYCERIN  (NITROSTAT ) 0.4 MG SL tablet Place 1 tablet (0.4 mg total) under the tongue every 5 (five) minutes as needed for chest pain. 25 tablet 5   polyethylene glycol (MIRALAX  / GLYCOLAX ) 17 g packet Take 17 g by mouth daily. 14 each 0   sacubitril -valsartan  (ENTRESTO ) 49-51 MG Take 1 tablet by mouth 2 (two) times daily. 60 tablet 0   No current facility-administered medications for this visit.    Allergies  Allergen Reactions   Penicillins Hives    Social History   Socioeconomic History   Marital status: Single    Spouse name: Not on file   Number of children: Not on file   Years of education: Not on file   Highest education level: Not on file  Occupational History   Occupation: retired  Tobacco Use   Smoking status: Never   Smokeless tobacco: Never  Vaping Use   Vaping status: Never Used  Substance and Sexual Activity   Alcohol use: Yes    Comment: rare   Drug use: No   Sexual activity: Not Currently    Birth control/protection: Post-menopausal  Other Topics Concern   Not on file  Social History Narrative   Not on file   Social Drivers of Health   Financial Resource Strain: Not on file  Food Insecurity: No Food Insecurity (10/12/2023)   Hunger Vital Sign    Worried About Running Out of Food in the Last Year: Never true    Ran Out of Food in the Last Year: Never true  Transportation Needs: Patient Declined (10/12/2023)   PRAPARE - Administrator, Civil Service (Medical): Patient declined    Lack of Transportation (Non-Medical): Patient declined  Physical Activity: Not on file  Stress: Not on file  Social Connections: Unknown (10/12/2023)   Social Connection and Isolation Panel    Frequency of Communication with Friends and Family: Three times a week    Frequency of Social Gatherings with Friends and Family: Once  a week    Attends Religious  Services: 1 to 4 times per year    Active Member of Clubs or Organizations: Patient declined    Attends Banker Meetings: Patient declined    Marital Status: Separated  Recent Concern: Social Connections - Socially Isolated (09/28/2023)   Social Connection and Isolation Panel    Frequency of Communication with Friends and Family: Three times a week    Frequency of Social Gatherings with Friends and Family: Once a week    Attends Religious Services: Never    Database administrator or Organizations: No    Attends Banker Meetings: Never    Marital Status: Separated  Intimate Partner Violence: Patient Declined (10/12/2023)   Humiliation, Afraid, Rape, and Kick questionnaire    Fear of Current or Ex-Partner: Patient declined    Emotionally Abused: Patient declined    Physically Abused: Patient declined    Sexually Abused: Patient declined     Review of Systems: Pertinent items noted in HPI and remainder of comprehensive ROS otherwise negative.   Blood pressure (!) 184/93, pulse 85, height 5' 6 (1.676 m), weight 187 lb (84.8 kg), SpO2 97%.  General appearance: alert, no distress and mildly obese Neck: no carotid bruit and no JVD Lungs: clear to auscultation bilaterally Heart: Regular bradycardia Abdomen: soft, non-tender; bowel sounds normal; no masses,  no organomegaly Extremities: edema trace pedal Pulses: 2+ and symmetric Skin: Skin color, texture, turgor normal. No rashes or lesions Neurologic: Grossly normal Psych: Pleasant  EKG Deferred  PROBLEM LIST: CAD status post PCI to the RCA in 2015, recent chest pain and PCI to the LAD with chronic total occluded RCA (07/2021) PAF - CHADSVASC score of 5 on Eliquis  Hypertension Dyslipidemia IDDM with neuropathy, poorly controlled with hyperglycemic admissions Carotid artery stenosis/occlusion - mild 2016 Leg edema  ASSESSMENT AND PLAN:  Cassandra Adams fortunately has not had any recent admissions for  chest pain and is now 2 years out from PCI.  She should remain on Eliquis  for A-fib but no antiplatelet therapy due to high bleeding risk.  Blood pressure is elevated today.  I advise increasing her carvedilol  to 6.25 mg twice daily.  She had recent carotid Dopplers which are stable and we will plan to repeat those in a year.  Follow-up with me in annually or sooner as necessary.  Cassandra KYM Maxcy, MD, [facility], FNLA, FACP  [place]  [facility]  Medical Director of the Advanced Lipid Disorders &  Cardiovascular Risk Reduction Clinic Diplomate of the American Board of Clinical Lipidology Attending Cardiologist  Direct Dial: [phone]  Fax: [phone]  Website:  [email or web address] Cassandra Adams 03/24/2024 4:45 PM

## 2024-03-24 NOTE — Patient Instructions (Signed)
 Medication Instructions:  INCREASE carvedilol  to 6.25mg  twice daily  Follow-Up: At [facility], you and your health needs are our priority.  As part of our continuing mission to provide you with exceptional heart care, our providers are all part of one team.  This team includes your primary Cardiologist (physician) and Advanced Practice Providers or APPs (Physician Assistants and Nurse Practitioners) who all work together to provide you with the care you need, when you need it.  Your next appointment:   12 months  We recommend signing up for the patient portal called MyChart.  Sign up information is provided on this After Visit Summary.  MyChart is used to connect with patients for Virtual Visits (Telemedicine).  Patients are able to view lab/test results, encounter notes, upcoming appointments, etc.  Non-urgent messages can be sent to your provider as well.   To learn more about what you can do with MyChart, go to [email or web address].

## 2024-03-27 ENCOUNTER — Other Ambulatory Visit (INDEPENDENT_AMBULATORY_CARE_PROVIDER_SITE_OTHER): Payer: Self-pay

## 2024-03-27 ENCOUNTER — Ambulatory Visit: Admitting: Orthopedic Surgery

## 2024-03-27 DIAGNOSIS — S82192D Other fracture of upper end of left tibia, subsequent encounter for closed fracture with routine healing: Secondary | ICD-10-CM

## 2024-03-27 DIAGNOSIS — M79605 Pain in left leg: Secondary | ICD-10-CM | POA: Diagnosis not present

## 2024-03-27 MED ORDER — GABAPENTIN 100 MG PO CAPS: 100.0000 mg | ORAL_CAPSULE | Freq: Two times a day (BID) | ORAL | 2 refills | Status: AC

## 2024-03-27 NOTE — Progress Notes (Signed)
    03/27/2024   Chief Complaint  Patient presents with   Knee Injury    Left 10/11/23    Encounter Diagnosis  Name Primary?   Other closed fracture of proximal end of left tibia with routine healing, subsequent encounter 10/11/23 Yes    What pharmacy do you use ? _______[facility] ____________________  DOI/DOS/ Date: 10/11/23  Was Improved but now painful again after most recent physical therapy

## 2024-03-27 NOTE — Progress Notes (Signed)
 Encounter Diagnoses  Name Primary?   Other closed fracture of proximal end of left tibia with routine healing, subsequent encounter 10/11/23    Pain in left leg Yes    Chief Complaint  Patient presents with   Knee Injury    Left 10/11/23    She s c/o medial knee pain and lateral leg pain   Worse after last PT visit   Xrays today   DG Knee 1-2 Views Left Result Date: 03/27/2024 X-ray report Chief complaint fracture proximal tibial metaphysis Images AP and lateral Reading: Fracture at the metaphyseal area of the left tibial shaft with obliquity of the fragment and sloping of the joint line towards the medial side.  Osteopenia is noted also in the bone but the fracture looks healed Impression: Healed fracture left proximal tibial metaphysis with angulation sloping of the fracture line medially    Fracture site is non tender   She is tender over the medial side of the knee joint and in the anterolateral compartment   Assessment and plan  The fracture has healed.  There is some obliquity of the joint line.  But based on the patient's medical status we have opted not to try to correct that  She is having some pain along the anterior compartment and medial joint line  We considered joint injection but she has been having trouble with her glucose management  We did add some gabapentin to help with the anterolateral compartment pain which may be neurogenic in nature  Meds ordered this encounter  Medications   gabapentin (NEURONTIN) 100 MG capsule    Sig: Take 1 capsule (100 mg total) by mouth 2 (two) times daily.    Dispense:  60 capsule    Refill:  2

## 2024-04-02 NOTE — Addendum Note (Signed)
 Addended by: MARGRETTE TAFT BRAVO on: 04/02/2024 07:59 PM   Modules accepted: Level of Service

## 2024-04-14 ENCOUNTER — Ambulatory Visit (INDEPENDENT_AMBULATORY_CARE_PROVIDER_SITE_OTHER): Admitting: Podiatry

## 2024-04-14 DIAGNOSIS — M79671 Pain in right foot: Secondary | ICD-10-CM

## 2024-04-14 DIAGNOSIS — B351 Tinea unguium: Secondary | ICD-10-CM | POA: Diagnosis not present

## 2024-04-14 DIAGNOSIS — M79672 Pain in left foot: Secondary | ICD-10-CM | POA: Diagnosis not present

## 2024-04-14 NOTE — Progress Notes (Signed)
 Patient presents for evaluation and treatment of tenderness and some redness around nails feet.  Tenderness around toes with walking and wearing shoes.  Physical exam:  General appearance: Alert, pleasant, and in no acute distress.  Vascular: Pedal pulses: DP 2/4 B/L, PT 0/4 B/L. Mild edema lower legs bilaterally  Neu  Dermatologic:  Nails thickened, disfigured, discolored 1-5 BL with subungual debris.  Redness and hypertrophic nail folds along nail folds bilaterally but no signs of drainage or infection.  Musculoskeletal:     Diagnosis: 1. Painful onychomycotic nails 1 through 5 bilaterally. 2. Pain toes 1 through 5 bilaterally.  Plan: -Debrided onychomycotic nails 1 through 5 bilaterally.  Sharply debrided nails with nail clipper and reduced with a power bur.  Return 3 months [facility]

## 2024-05-21 DIAGNOSIS — E785 Hyperlipidemia, unspecified: Secondary | ICD-10-CM | POA: Diagnosis not present

## 2024-05-21 DIAGNOSIS — E538 Deficiency of other specified B group vitamins: Secondary | ICD-10-CM | POA: Diagnosis not present

## 2024-05-21 DIAGNOSIS — E559 Vitamin D deficiency, unspecified: Secondary | ICD-10-CM | POA: Diagnosis not present

## 2024-05-30 ENCOUNTER — Ambulatory Visit: Admitting: Internal Medicine

## 2024-07-10 ENCOUNTER — Other Ambulatory Visit: Payer: Self-pay | Admitting: Adult Health

## 2024-07-11 NOTE — Telephone Encounter (Signed)
 Creatine out of Normal Range at last Lab  In accordance with refill protocols, please review and address the following requirements before this medication refill can be authorized:  Labs

## 2024-07-14 ENCOUNTER — Ambulatory Visit: Admitting: Internal Medicine

## 2024-07-14 ENCOUNTER — Encounter: Payer: Self-pay | Admitting: Internal Medicine

## 2024-07-14 ENCOUNTER — Other Ambulatory Visit

## 2024-07-14 VITALS — BP 102/62 | Ht 66.0 in | Wt 187.0 lb

## 2024-07-14 DIAGNOSIS — N1832 Chronic kidney disease, stage 3b: Secondary | ICD-10-CM | POA: Diagnosis not present

## 2024-07-14 DIAGNOSIS — E1122 Type 2 diabetes mellitus with diabetic chronic kidney disease: Secondary | ICD-10-CM

## 2024-07-14 DIAGNOSIS — E1159 Type 2 diabetes mellitus with other circulatory complications: Secondary | ICD-10-CM | POA: Diagnosis not present

## 2024-07-14 DIAGNOSIS — Z794 Long term (current) use of insulin: Secondary | ICD-10-CM

## 2024-07-14 DIAGNOSIS — E1142 Type 2 diabetes mellitus with diabetic polyneuropathy: Secondary | ICD-10-CM | POA: Diagnosis not present

## 2024-07-14 LAB — POCT GLYCOSYLATED HEMOGLOBIN (HGB A1C): Hemoglobin A1C: 9 % — AB (ref 4.0–5.6)

## 2024-07-14 MED ORDER — BD PEN NEEDLE MINI ULTRAFINE 31G X 5 MM MISC: 1.0000 | Freq: Four times a day (QID) | 3 refills | Status: AC

## 2024-07-14 MED ORDER — NOVOLOG FLEXPEN 100 UNIT/ML ~~LOC~~ SOPN: PEN_INJECTOR | SUBCUTANEOUS | 3 refills | Status: AC

## 2024-07-14 MED ORDER — LANTUS SOLOSTAR 100 UNIT/ML ~~LOC~~ SOPN: 22.0000 [IU] | PEN_INJECTOR | Freq: Every day | SUBCUTANEOUS | 3 refills | Status: AC

## 2024-07-14 MED ORDER — EMPAGLIFLOZIN 10 MG PO TABS: 10.0000 mg | ORAL_TABLET | Freq: Every day | ORAL | 0 refills | Status: AC

## 2024-07-14 NOTE — Patient Instructions (Signed)
 Continue Jardiance  10 mg daily Continue  Basaglar  22 units once daily Take Novolog  4 units with each meal PLUS extra if needed as below   Novolog  correctional insulin : ADD extra units on insulin  to your meal-time Novolog  dose if your blood sugars are higher than 175. Use the scale below to help guide you:   Blood sugar before meal Number of units to inject  Less than 175 0 unit  176 - 220 1 units  221 - 265 2 units  266 - 310 3 units  311 - 355 4 units  356 - 400 5 units     - HOW TO TREAT LOW BLOOD SUGARS (Blood sugar LESS THAN 70 MG/DL) Please follow the RULE OF 15 for the treatment of hypoglycemia treatment (when your (blood sugars are less than 70 mg/dL)   STEP 1: Take 15 grams of carbohydrates when your blood sugar is low, which includes:  3-4 GLUCOSE TABS  OR 3-4 OZ OF JUICE OR REGULAR SODA OR ONE TUBE OF GLUCOSE GEL    STEP 2: RECHECK blood sugar in 15 MINUTES STEP 3: If your blood sugar is still low at the 15 minute recheck --> then, go back to STEP 1 and treat AGAIN with another 15 grams of carbohydrates. - HOW TO TREAT LOW BLOOD SUGARS (Blood sugar LESS THAN 70 MG/DL) Please follow the RULE OF 15 for the treatment of hypoglycemia treatment (when your (blood sugars are less than 70 mg/dL)   STEP 1: Take 15 grams of carbohydrates when your blood sugar is low, which includes:  3-4 GLUCOSE TABS  OR 3-4 OZ OF JUICE OR REGULAR SODA OR ONE TUBE OF GLUCOSE GEL    STEP 2: RECHECK blood sugar in 15 MINUTES STEP 3: If your blood sugar is still low at the 15 minute recheck --> then, go back to STEP 1 and treat AGAIN with another 15 grams of carbohydrates.

## 2024-07-14 NOTE — Progress Notes (Unsigned)
 " Name: Cassandra Adams  MRN/ DOB: [id number], 02/28/1946   Age/ Sex: 79 y.o., female    PCP: Marvine Rush, MD   Reason for Endocrinology Evaluation: Type 2 Diabetes Mellitus     Date of Initial Endocrinology Visit: 12/04/2022    PATIENT IDENTIFIER: [name] is a 79 y.o. female with a past medical history of DM, HTN, A-fib, CHF, CAD. The patient presented for initial endocrinology clinic visit on 12/04/2022 for consultative assistance with her diabetes management.    HPI: Cassandra Adams is accompanied by her son   Diagnosed with DM years ago  Prior Medications tried/Intolerance: Limited glycemic agents due to low GFR Hemoglobin A1c has ranged from 8.1% in 2022, peaking at 11.0%   On her initial visit to our clinic she had an A1c of 8.5%, she was on Jardiance , Basaglar  and NovoLog  per correction scale..   We started Ozempic , provided a standing dose of NovoLog  with each meal plus correction scale  Ozempic  was not started by the patient was taken off her list as she was having GI issue 06/2023 SUBJECTIVE:   During the last visit (01/25/2024): A1c 7.1%   Today (07/14/24): Cassandra Adams is here for follow-up with diabetes management.  She checks her blood sugars multiple times daily through CGM. She is accompanied by her daughter, who provided the history.  The patient is unable to speak due to hoarseness, per daughter, the patient has been evaluated by multiple providers, no abnormality, was recommended speech therapy   No nausea  No constipation but was having diarrhea that has resolved  Denies sore throat or fever    HOME DIABETES REGIMEN: Jardiance  10 mg daily Basaglar  22 units daily in the morning Novolog :   Blood sugar before meal Number of units to inject  Less than 100 0 unit  100-  170 3 units  171 - 210 4 units  211 - 250 5 units  251- 290 6 units  291 - 330 7 units  331 - 370 8 units  371 - 410 9 units  411 - 450 10 units         Statin: Yes ACE-I/ARB: Yes   CONTINUOUS GLUCOSE MONITORING RECORD INTERPRETATION                     DIABETIC COMPLICATIONS: Microvascular complications:  CKD III, neuropathy Denies:  Last eye exam: Completed 11/09/2022  Macrovascular complications:  CAD, CHF Denies: PVD, CVA   PAST HISTORY: Past Medical History:  Past Medical History:  Diagnosis Date   (HFpEF) heart failure with preserved ejection fraction (HCC)    Limited Echo 7/22: Mild asymmetrical LVH due to scar and thinning of basal posterior wall and background conc LVH, apical and mid segments hyperdynamic, EF 60-65, basal inf-lat and basal inf AK, normal RVSF, RVSP 26.6, mild LAE, mild-moderate MR, mild AV sclerosis w/o AS // Echo 5/22: EF 55-60, RVSP 27, mild to mod LAE, poss small to mod eff; AV sclerosis no AS   Arthritis    all over   CAD (coronary artery disease)    a. NSTEMI 12/2013 - occluded dominant RCA with L-R collaterals, moderate prox segmental LAD disease, for medical therapy initially, EF 60% with subtle inferobasal hypokinesia. Consider PCI for refractory CP.   CKD (chronic kidney disease), stage III (HCC)    Hypertension    Migraines    weekly (01/06/2014)   Sickle cell trait    TIA (transient ischemic attack) 1978  Type II diabetes mellitus (HCC)    Vertigo    Past Surgical History:  Past Surgical History:  Procedure Laterality Date   APPENDECTOMY  08/24/2012   had appendix frozen   CARDIAC CATHETERIZATION  years ago & 01/05/2014   CARDIOVERSION N/A 03/17/2014   Procedure: CARDIOVERSION;  Surgeon: Vinie KYM Maxcy, MD;  Location: [facility] ENDOSCOPY;  Service: Cardiovascular;  Laterality: N/A;   CARDIOVERSION N/A 01/14/2021   Procedure: CARDIOVERSION;  Surgeon: Maxcy Vinie BROCKS, MD;  Location: [facility] ENDOSCOPY;  Service: Cardiovascular;  Laterality: N/A;   CARDIOVERSION N/A 12/13/2021   Procedure: CARDIOVERSION;  Surgeon: Alveta Aleene PARAS, MD;  Location: [facility] ENDOSCOPY;   Service: Cardiovascular;  Laterality: N/A;   CATARACT EXTRACTION W/ INTRAOCULAR LENS  IMPLANT, BILATERAL Bilateral 04/2013-05/2013   CORONARY ATHERECTOMY N/A 08/05/2021   Procedure: CORONARY ATHERECTOMY;  Surgeon: Wonda Sharper, MD;  Location: [facility] INVASIVE CV LAB;  Service: Cardiovascular;  Laterality: N/A;   CORONARY PRESSURE/FFR STUDY N/A 08/04/2021   Procedure: INTRAVASCULAR PRESSURE WIRE/FFR STUDY;  Surgeon: Mady Bruckner, MD;  Location: [facility] INVASIVE CV LAB;  Service: Cardiovascular;  Laterality: N/A;   CORONARY STENT INTERVENTION N/A 08/05/2021   Procedure: CORONARY STENT INTERVENTION;  Surgeon: Wonda Sharper, MD;  Location: [facility] INVASIVE CV LAB;  Service: Cardiovascular;  Laterality: N/A;   ENDARTERECTOMY Right 02/05/2017   Procedure: ENDARTERECTOMY CAROTID-RIGHT;  Surgeon: Harvey Carlin BRAVO, MD;  Location: [facility] OR;  Service: Vascular;  Laterality: Right;   ESOPHAGOGASTRODUODENOSCOPY  11/08/2007    Normal esophagus without evidence of Barrett, mass, erosion/ Normal stomach, duodenal bulb   ESOPHAGOGASTRODUODENOSCOPY (EGD) WITH PROPOFOL  N/A 11/08/2020   Procedure: ESOPHAGOGASTRODUODENOSCOPY (EGD) WITH PROPOFOL ;  Surgeon: Eda Iha, MD;  Location: [facility] ENDOSCOPY;  Service: Gastroenterology;  Laterality: N/A;   GASTRIC MOTILITY STUDY  11/13/2007   mildly delayed emptying subjectively, but normal  analysis-77% of tracer emptied at 2 hours   JOINT REPLACEMENT     LAPAROSCOPIC APPENDECTOMY N/A 09/15/2012   Procedure: APPENDECTOMY LAPAROSCOPIC;  Surgeon: Oneil DELENA Budge, MD;  Location: [facility] ORS;  Service: General;  Laterality: N/A;   LEFT HEART CATH AND CORONARY ANGIOGRAPHY N/A 08/04/2021   Procedure: LEFT HEART CATH AND CORONARY ANGIOGRAPHY;  Surgeon: Mady Bruckner, MD;  Location: [facility] INVASIVE CV LAB;  Service: Cardiovascular;  Laterality: N/A;   LEFT HEART CATHETERIZATION WITH CORONARY ANGIOGRAM N/A 01/05/2014   Procedure: LEFT HEART CATHETERIZATION WITH CORONARY ANGIOGRAM;  Surgeon:  Dorn PARAS Lesches, MD;  Location: [facility] CATH LAB;  Service: Cardiovascular;  Laterality: N/A;   REPLACEMENT TOTAL KNEE Right 05/03/2006   TEE WITHOUT CARDIOVERSION N/A 03/17/2014   Procedure: TRANSESOPHAGEAL ECHOCARDIOGRAM (TEE);  Surgeon: Vinie KYM Maxcy, MD;  Location: [facility] ENDOSCOPY;  Service: Cardiovascular;  Laterality: N/A;   TONSILLECTOMY  06/26/1970    Social History:  reports that she has never smoked. She has never used smokeless tobacco. She reports current alcohol use. She reports that she does not use drugs. Family History:  Family History  Problem Relation Age of Onset   Hyperlipidemia Mother    Hypertension Mother    Heart attack Mother    Cancer Father        lung   Hypertension Sister    Diabetes Brother    Heart disease Brother    Hyperlipidemia Brother    Hypertension Brother    Heart attack Brother    Other Brother        DVT     HOME MEDICATIONS: Allergies as of 07/14/2024       Reactions   Penicillins Hives  Medication List        Accurate as of July 14, 2024  1:31 PM. If you have any questions, ask your nurse or doctor.          Accu-Chek Guide Test test strip Generic drug: glucose blood 1 each by Other route daily in the afternoon. Use as instructed   Accu-Chek Guide w/Device Kit 1 Device by Does not apply route daily in the afternoon.   acetaminophen  650 MG CR tablet Commonly known as: TYLENOL  Take 650 mg by mouth every 6 (six) hours as needed for pain.   amLODipine  10 MG tablet Commonly known as: NORVASC  Take 1 tablet (10 mg total) by mouth daily.   atorvastatin  80 MG tablet Commonly known as: LIPITOR  Take 1 tablet (80 mg total) by mouth every evening.   BD Pen Needle Mini Ultrafine 31G X 5 MM Misc Generic drug: Insulin  Pen Needle 1 Device by Other route in the morning, at noon, in the evening, and at bedtime. What changed: See the new instructions. Changed by: Donell Butts, MD   carvedilol  6.25 MG  tablet Commonly known as: COREG  Take 1 tablet (6.25 mg total) by mouth 2 (two) times daily with a meal.   colesevelam  625 MG tablet Commonly known as: WELCHOL  TAKE 3 TABLETS BY MOUTH TWICE A DAY OR 6 TABLETS ONCE DAILY WITH MEALS.   Eliquis  5 MG Tabs tablet Generic drug: apixaban  Take 1 tablet (5 mg total) by mouth 2 (two) times daily.   empagliflozin  10 MG Tabs tablet Commonly known as: Jardiance  Take 1 tablet (10 mg total) by mouth daily.   furosemide  40 MG tablet Commonly known as: LASIX  Take 1 tablet (40 mg total) by mouth daily.   gabapentin  100 MG capsule Commonly known as: NEURONTIN  Take 1 capsule (100 mg total) by mouth 2 (two) times daily.   HYDROcodone -acetaminophen  5-325 MG tablet Commonly known as: NORCO/VICODIN Take 1 tablet by mouth every 6 (six) hours as needed for moderate pain (pain score 4-6).   isosorbide  mononitrate 60 MG 24 hr tablet Commonly known as: IMDUR  Take 1 tablet (60 mg total) by mouth daily.   Lantus  SoloStar 100 UNIT/ML Solostar Pen Generic drug: insulin  glargine Inject 22 Units into the skin daily.   mupirocin  ointment 2 % Commonly known as: BACTROBAN  Apply 1 Application topically 2 (two) times daily.   nitroGLYCERIN  0.4 MG SL tablet Commonly known as: NITROSTAT  Place 1 tablet (0.4 mg total) under the tongue every 5 (five) minutes as needed for chest pain.   NovoLOG  FlexPen 100 UNIT/ML FlexPen Generic drug: insulin  aspart Max daily 30 units What changed: See the new instructions. Changed by: Donell Butts, MD   polyethylene glycol 17 g packet Commonly known as: MIRALAX  / GLYCOLAX  Take 17 g by mouth daily.   sacubitril -valsartan  49-51 MG Commonly known as: Entresto  Take 1 tablet by mouth 2 (two) times daily.         ALLERGIES: Allergies  Allergen Reactions   Penicillins Hives     REVIEW OF SYSTEMS: A comprehensive ROS was conducted with the patient and is negative except as per HPI    OBJECTIVE:   VITAL  SIGNS: BP 102/62   Ht 5' 6 (1.676 m)   Wt 187 lb (84.8 kg)   BMI 30.18 kg/m    PHYSICAL EXAM:  General: Pt appears well and is in NAD  Lungs: Clear with good BS bilat   Heart: RRR   Extremities:  Lower extremities -Trace edema around the ankles bilaterally  Neuro: MS is good with appropriate affect, pt is alert and Ox3    DM foot exam: 04/14/2024 per podiatry    DATA REVIEWED:  Lab Results  Component Value Date   HGBA1C 9.0 (A) 07/14/2024   HGBA1C 7.1 (A) 01/25/2024   HGBA1C 7.8 (H) 09/28/2023     Latest Reference Range & Units 01/17/24 20:51  Sodium 135 - 145 mmol/L 137  Potassium 3.5 - 5.1 mmol/L 4.7  Chloride 98 - 111 mmol/L 97 (L)  CO2 22 - 32 mmol/L 24  Glucose 70 - 99 mg/dL 304 (HH)  BUN 8 - 23 mg/dL 45 (H)  Creatinine 9.55 - 1.00 mg/dL 8.25 (H)  Calcium  8.9 - 10.3 mg/dL 89.9  Anion gap 5 - 15  16 (H)  Alkaline Phosphatase 38 - 126 U/L 160 (H)  Albumin 3.5 - 5.0 g/dL 3.8  AST 15 - 41 U/L 21  ALT 0 - 44 U/L 17  Total Protein 6.5 - 8.1 g/dL 8.0  Total Bilirubin 0.0 - 1.2 mg/dL 0.6  GFR, Estimated >39 mL/min 29 (L)     Old records , labs and images have been reviewed.   ASSESSMENT / PLAN / RECOMMENDATIONS:   1) Type 2 Diabetes Mellitus, poorly controlled, With CKD III , neuropathic and macrovascular complications - Most recent A1c of 9.0%. Goal A1c < 7.0 %.     -Patient has been noting worsening glycemic control, A1c increased from 7.1% to 9.0% -I have attempted to prescribe Ozempic  in the past but she did not started as she was having GI side effects -I will we add standing dose of prandial insulin , I did advise the patient to take this right before the meal, historically she will take her prandial insulin  showing times of the day regardless whether this was associated with a meal or not which has increased the risk of hypoglycemia. -Patient will be provided with a new correction scale as below  -Basaglar  is not on the formulary, will switch to  Lantus   MEDICATIONS:  Continue Jardiance  10 mg daily Continue Lantus  22 units daily Start NovoLog  4 units with each meal Take Novolog  correctional  (BG -130/45)     EDUCATION / INSTRUCTIONS: BG monitoring instructions: Patient is instructed to check her blood sugars 3 times a day. Call [facility] if: BG persistently < 70  I reviewed the Rule of 15 for the treatment of hypoglycemia in detail with the patient. Literature supplied.   2) Diabetic complications:  Eye: Does  have known diabetic retinopathy.  Neuro/ Feet: Does  have known diabetic peripheral neuropathy. Renal: Patient does  have known baseline CKD. She is under  the care under the care of nephrology     Follow-up in 3 months   Signed electronically by: Stefano Redgie Butts, MD  [facility]  [facility] [address] [place] [place], [place] [address] Phone: [phone] FAX: [phone]   CC: Marvine Rush, MD [address] [place] [address] Phone: [phone]  Fax: [phone]    Return to Endocrinology clinic as below: Future Appointments  Date Time Provider Department Center  07/15/2024  4:00 PM Christine Rush, DPM [facility]      "

## 2024-07-15 ENCOUNTER — Ambulatory Visit: Admitting: Podiatry

## 2024-07-15 ENCOUNTER — Other Ambulatory Visit

## 2024-07-15 ENCOUNTER — Ambulatory Visit: Payer: Self-pay | Admitting: Internal Medicine

## 2024-07-15 DIAGNOSIS — B351 Tinea unguium: Secondary | ICD-10-CM | POA: Diagnosis not present

## 2024-07-15 DIAGNOSIS — M79674 Pain in right toe(s): Secondary | ICD-10-CM | POA: Diagnosis not present

## 2024-07-15 DIAGNOSIS — M79675 Pain in left toe(s): Secondary | ICD-10-CM

## 2024-07-15 LAB — BASIC METABOLIC PANEL WITH GFR
BUN/Creatinine Ratio: 17 (calc) (ref 6–22)
BUN: 34 mg/dL — ABNORMAL HIGH (ref 7–25)
CO2: 27 mmol/L (ref 20–32)
Calcium: 9.3 mg/dL (ref 8.6–10.4)
Chloride: 104 mmol/L (ref 98–110)
Creat: 2.04 mg/dL — ABNORMAL HIGH (ref 0.60–1.00)
Glucose, Bld: 432 mg/dL — ABNORMAL HIGH (ref 65–99)
Potassium: 4.5 mmol/L (ref 3.5–5.3)
Sodium: 141 mmol/L (ref 135–146)
eGFR: 24 mL/min/1.73m2 — ABNORMAL LOW

## 2024-07-15 NOTE — Progress Notes (Signed)
 Patient presents for evaluation and treatment of tenderness and some redness around nails feet.  Tenderness around toes with walking and wearing shoes.  Physical exam:  General appearance: Alert, pleasant, and in no acute distress.  Vascular: Pedal pulses: DP 2/4 B/L, PT 0/4 B/L. Mild edema lower legs bilaterally.  Capillary refill time immediate bilaterally  Neurologic:  Dermatologic:  Nails thickened, disfigured, discolored 1-5 BL with subungual debris.  Redness and hypertrophic nail folds along nail folds bilaterally but no signs of drainage or infection.  Musculoskeletal:     Diagnosis: 1. Painful onychomycotic nails 1 through 5 bilaterally. 2. Pain toes 1 through 5 bilaterally.  Plan: -Debrided onychomycotic nails 1 through 5 bilaterally.  Sharply debrided nails with nail clipper and reduced with a power bur.  Return 3 months [facility]

## 2024-07-16 LAB — MICROALBUMIN / CREATININE URINE RATIO
Creatinine, Urine: 46 mg/dL (ref 20–275)
Microalb Creat Ratio: 104 mg/g{creat} — ABNORMAL HIGH
Microalb, Ur: 4.8 mg/dL

## 2024-07-26 ENCOUNTER — Other Ambulatory Visit: Payer: Self-pay | Admitting: Internal Medicine

## 2024-10-13 ENCOUNTER — Ambulatory Visit: Admitting: Podiatry

## 2024-10-17 ENCOUNTER — Ambulatory Visit: Admitting: Internal Medicine
